# Patient Record
Sex: Male | Born: 1945 | Race: White | Hispanic: No | Marital: Married | Smoking: Former smoker
Health system: Southern US, Community
[De-identification: ages and names within clinical notes are randomized; demographics above are authoritative.]

## PROBLEM LIST (undated history)

## (undated) DIAGNOSIS — IMO0002 Reserved for concepts with insufficient information to code with codable children: Secondary | ICD-10-CM

## (undated) DIAGNOSIS — Z8507 Personal history of malignant neoplasm of pancreas: Secondary | ICD-10-CM

## (undated) DIAGNOSIS — Z8659 Personal history of other mental and behavioral disorders: Secondary | ICD-10-CM

## (undated) DIAGNOSIS — K449 Diaphragmatic hernia without obstruction or gangrene: Secondary | ICD-10-CM

## (undated) DIAGNOSIS — L309 Dermatitis, unspecified: Secondary | ICD-10-CM

## (undated) DIAGNOSIS — Z87442 Personal history of urinary calculi: Secondary | ICD-10-CM

## (undated) DIAGNOSIS — N400 Enlarged prostate without lower urinary tract symptoms: Secondary | ICD-10-CM

## (undated) DIAGNOSIS — R252 Cramp and spasm: Secondary | ICD-10-CM

## (undated) DIAGNOSIS — I739 Peripheral vascular disease, unspecified: Secondary | ICD-10-CM

## (undated) DIAGNOSIS — G4733 Obstructive sleep apnea (adult) (pediatric): Secondary | ICD-10-CM

## (undated) DIAGNOSIS — Z794 Long term (current) use of insulin: Secondary | ICD-10-CM

## (undated) DIAGNOSIS — Z972 Presence of dental prosthetic device (complete) (partial): Secondary | ICD-10-CM

## (undated) DIAGNOSIS — H8109 Meniere's disease, unspecified ear: Secondary | ICD-10-CM

## (undated) DIAGNOSIS — R0600 Dyspnea, unspecified: Secondary | ICD-10-CM

## (undated) DIAGNOSIS — J90 Pleural effusion, not elsewhere classified: Secondary | ICD-10-CM

## (undated) DIAGNOSIS — D6851 Activated protein C resistance: Secondary | ICD-10-CM

## (undated) DIAGNOSIS — M5136 Other intervertebral disc degeneration, lumbar region: Secondary | ICD-10-CM

## (undated) DIAGNOSIS — F32A Depression, unspecified: Secondary | ICD-10-CM

## (undated) DIAGNOSIS — E1165 Type 2 diabetes mellitus with hyperglycemia: Secondary | ICD-10-CM

## (undated) DIAGNOSIS — I10 Essential (primary) hypertension: Secondary | ICD-10-CM

## (undated) DIAGNOSIS — R399 Unspecified symptoms and signs involving the genitourinary system: Secondary | ICD-10-CM

## (undated) DIAGNOSIS — F329 Major depressive disorder, single episode, unspecified: Secondary | ICD-10-CM

## (undated) DIAGNOSIS — S31109A Unspecified open wound of abdominal wall, unspecified quadrant without penetration into peritoneal cavity, initial encounter: Secondary | ICD-10-CM

## (undated) DIAGNOSIS — H9191 Unspecified hearing loss, right ear: Secondary | ICD-10-CM

## (undated) DIAGNOSIS — M51369 Other intervertebral disc degeneration, lumbar region without mention of lumbar back pain or lower extremity pain: Secondary | ICD-10-CM

## (undated) DIAGNOSIS — Z87828 Personal history of other (healed) physical injury and trauma: Secondary | ICD-10-CM

## (undated) DIAGNOSIS — M1990 Unspecified osteoarthritis, unspecified site: Secondary | ICD-10-CM

## (undated) DIAGNOSIS — K219 Gastro-esophageal reflux disease without esophagitis: Secondary | ICD-10-CM

## (undated) DIAGNOSIS — F419 Anxiety disorder, unspecified: Secondary | ICD-10-CM

## (undated) DIAGNOSIS — I4510 Unspecified right bundle-branch block: Secondary | ICD-10-CM

## (undated) DIAGNOSIS — J634 Siderosis: Secondary | ICD-10-CM

## (undated) HISTORY — DX: Depression, unspecified: F32.A

## (undated) HISTORY — PX: INGUINAL HERNIA REPAIR: SUR1180

## (undated) HISTORY — DX: Major depressive disorder, single episode, unspecified: F32.9

## (undated) HISTORY — PX: HEMORRHOIDECTOMY WITH HEMORRHOID BANDING: SHX5633

## (undated) HISTORY — DX: Unspecified osteoarthritis, unspecified site: M19.90

## (undated) HISTORY — DX: Meniere's disease, unspecified ear: H81.09

## (undated) HISTORY — PX: KNEE ARTHROSCOPY: SUR90

## (undated) HISTORY — PX: TOTAL KNEE ARTHROPLASTY: SHX125

## (undated) HISTORY — PX: COLONOSCOPY: SHX174

## (undated) HISTORY — DX: Benign prostatic hyperplasia without lower urinary tract symptoms: N40.0

## (undated) HISTORY — PX: ROTATOR CUFF REPAIR: SHX139

## (undated) HISTORY — DX: Obstructive sleep apnea (adult) (pediatric): G47.33

## (undated) HISTORY — PX: UMBILICAL HERNIA REPAIR: SHX196

## (undated) MED FILL — Insulin Degludec Soln Pen-Injector 100 Unit/ML: SUBCUTANEOUS | Fill #2 | Status: CN

---

## 1997-07-31 ENCOUNTER — Encounter: Admission: RE | Admit: 1997-07-31 | Discharge: 1997-10-29 | Payer: Self-pay | Admitting: Family Medicine

## 1998-10-29 ENCOUNTER — Emergency Department (HOSPITAL_COMMUNITY): Admission: EM | Admit: 1998-10-29 | Discharge: 1998-10-29 | Payer: Self-pay | Admitting: Emergency Medicine

## 1998-10-29 ENCOUNTER — Encounter: Payer: Self-pay | Admitting: Emergency Medicine

## 1998-11-22 ENCOUNTER — Ambulatory Visit (HOSPITAL_COMMUNITY): Admission: RE | Admit: 1998-11-22 | Discharge: 1998-11-22 | Payer: Self-pay | Admitting: Interventional Cardiology

## 1999-08-11 ENCOUNTER — Ambulatory Visit (HOSPITAL_COMMUNITY): Admission: RE | Admit: 1999-08-11 | Discharge: 1999-08-11 | Payer: Self-pay | Admitting: Surgery

## 2000-01-02 ENCOUNTER — Encounter: Payer: Self-pay | Admitting: Orthopedic Surgery

## 2000-01-02 ENCOUNTER — Encounter: Admission: RE | Admit: 2000-01-02 | Discharge: 2000-01-02 | Payer: Self-pay | Admitting: Orthopedic Surgery

## 2000-04-10 ENCOUNTER — Encounter: Payer: Self-pay | Admitting: Orthopedic Surgery

## 2000-04-10 ENCOUNTER — Encounter: Admission: RE | Admit: 2000-04-10 | Discharge: 2000-04-10 | Payer: Self-pay | Admitting: Orthopedic Surgery

## 2000-08-07 ENCOUNTER — Encounter: Admission: RE | Admit: 2000-08-07 | Discharge: 2000-08-07 | Payer: Self-pay | Admitting: Orthopedic Surgery

## 2000-08-07 ENCOUNTER — Encounter: Payer: Self-pay | Admitting: Orthopedic Surgery

## 2000-08-08 ENCOUNTER — Ambulatory Visit (HOSPITAL_BASED_OUTPATIENT_CLINIC_OR_DEPARTMENT_OTHER): Admission: RE | Admit: 2000-08-08 | Discharge: 2000-08-08 | Payer: Self-pay | Admitting: Orthopedic Surgery

## 2000-10-30 ENCOUNTER — Encounter: Admission: RE | Admit: 2000-10-30 | Discharge: 2000-10-30 | Payer: Self-pay | Admitting: Orthopedic Surgery

## 2000-10-30 ENCOUNTER — Encounter: Payer: Self-pay | Admitting: Orthopedic Surgery

## 2000-11-23 ENCOUNTER — Ambulatory Visit (HOSPITAL_BASED_OUTPATIENT_CLINIC_OR_DEPARTMENT_OTHER): Admission: RE | Admit: 2000-11-23 | Discharge: 2000-11-23 | Payer: Self-pay | Admitting: General Surgery

## 2001-10-25 ENCOUNTER — Encounter: Admission: RE | Admit: 2001-10-25 | Discharge: 2001-10-25 | Payer: Self-pay | Admitting: Orthopedic Surgery

## 2001-10-25 ENCOUNTER — Encounter: Payer: Self-pay | Admitting: Orthopedic Surgery

## 2001-10-26 ENCOUNTER — Encounter: Admission: RE | Admit: 2001-10-26 | Discharge: 2001-10-26 | Payer: Self-pay | Admitting: Orthopedic Surgery

## 2001-10-26 ENCOUNTER — Encounter: Payer: Self-pay | Admitting: Orthopedic Surgery

## 2001-11-15 ENCOUNTER — Encounter: Payer: Self-pay | Admitting: Orthopedic Surgery

## 2001-11-15 ENCOUNTER — Encounter: Admission: RE | Admit: 2001-11-15 | Discharge: 2001-11-15 | Payer: Self-pay | Admitting: Orthopedic Surgery

## 2001-11-29 ENCOUNTER — Encounter: Admission: RE | Admit: 2001-11-29 | Discharge: 2001-11-29 | Payer: Self-pay | Admitting: Orthopedic Surgery

## 2001-11-29 ENCOUNTER — Encounter: Payer: Self-pay | Admitting: Orthopedic Surgery

## 2001-12-13 ENCOUNTER — Encounter: Payer: Self-pay | Admitting: Family Medicine

## 2001-12-13 ENCOUNTER — Encounter: Admission: RE | Admit: 2001-12-13 | Discharge: 2001-12-13 | Payer: Self-pay | Admitting: Family Medicine

## 2002-04-28 ENCOUNTER — Encounter
Admission: RE | Admit: 2002-04-28 | Discharge: 2002-07-27 | Payer: Self-pay | Admitting: Physical Medicine and Rehabilitation

## 2003-06-10 ENCOUNTER — Ambulatory Visit (HOSPITAL_COMMUNITY): Admission: RE | Admit: 2003-06-10 | Discharge: 2003-06-10 | Payer: Self-pay | Admitting: Family Medicine

## 2004-04-25 ENCOUNTER — Emergency Department (HOSPITAL_COMMUNITY): Admission: EM | Admit: 2004-04-25 | Discharge: 2004-04-25 | Payer: Self-pay | Admitting: Emergency Medicine

## 2004-11-25 ENCOUNTER — Emergency Department (HOSPITAL_COMMUNITY): Admission: EM | Admit: 2004-11-25 | Discharge: 2004-11-25 | Payer: Self-pay | Admitting: Emergency Medicine

## 2004-12-06 ENCOUNTER — Encounter: Admission: RE | Admit: 2004-12-06 | Discharge: 2004-12-06 | Payer: Self-pay | Admitting: Otolaryngology

## 2005-06-13 ENCOUNTER — Encounter: Admission: RE | Admit: 2005-06-13 | Discharge: 2005-06-27 | Payer: Self-pay | Admitting: Family Medicine

## 2006-03-08 ENCOUNTER — Encounter (INDEPENDENT_AMBULATORY_CARE_PROVIDER_SITE_OTHER): Payer: Self-pay | Admitting: Specialist

## 2006-03-08 ENCOUNTER — Ambulatory Visit (HOSPITAL_COMMUNITY): Admission: RE | Admit: 2006-03-08 | Discharge: 2006-03-08 | Payer: Self-pay | Admitting: Gastroenterology

## 2006-05-25 ENCOUNTER — Ambulatory Visit: Payer: Self-pay | Admitting: Critical Care Medicine

## 2006-06-22 ENCOUNTER — Ambulatory Visit: Payer: Self-pay | Admitting: Critical Care Medicine

## 2007-08-19 ENCOUNTER — Encounter: Admission: RE | Admit: 2007-08-19 | Discharge: 2007-08-19 | Payer: Self-pay | Admitting: Family Medicine

## 2007-08-20 ENCOUNTER — Ambulatory Visit (HOSPITAL_COMMUNITY): Admission: RE | Admit: 2007-08-20 | Discharge: 2007-08-20 | Payer: Self-pay | Admitting: Orthopedic Surgery

## 2008-01-31 ENCOUNTER — Inpatient Hospital Stay (HOSPITAL_COMMUNITY): Admission: RE | Admit: 2008-01-31 | Discharge: 2008-02-02 | Payer: Self-pay | Admitting: Orthopedic Surgery

## 2008-02-25 ENCOUNTER — Encounter: Admission: RE | Admit: 2008-02-25 | Discharge: 2008-03-20 | Payer: Self-pay | Admitting: Orthopedic Surgery

## 2008-07-09 ENCOUNTER — Encounter (INDEPENDENT_AMBULATORY_CARE_PROVIDER_SITE_OTHER): Payer: Self-pay | Admitting: Gastroenterology

## 2008-07-09 ENCOUNTER — Ambulatory Visit (HOSPITAL_COMMUNITY): Admission: RE | Admit: 2008-07-09 | Discharge: 2008-07-09 | Payer: Self-pay | Admitting: Gastroenterology

## 2008-09-29 ENCOUNTER — Ambulatory Visit (HOSPITAL_COMMUNITY): Admission: RE | Admit: 2008-09-29 | Discharge: 2008-09-29 | Payer: Self-pay | Admitting: Orthopedic Surgery

## 2008-10-08 ENCOUNTER — Ambulatory Visit (HOSPITAL_COMMUNITY): Admission: RE | Admit: 2008-10-08 | Discharge: 2008-10-08 | Payer: Self-pay | Admitting: Orthopedic Surgery

## 2008-11-25 ENCOUNTER — Encounter
Admission: RE | Admit: 2008-11-25 | Discharge: 2009-02-01 | Payer: Self-pay | Admitting: Physical Medicine and Rehabilitation

## 2009-02-02 ENCOUNTER — Encounter: Admission: RE | Admit: 2009-02-02 | Discharge: 2009-02-02 | Payer: Self-pay | Admitting: Family Medicine

## 2009-08-20 ENCOUNTER — Ambulatory Visit (HOSPITAL_BASED_OUTPATIENT_CLINIC_OR_DEPARTMENT_OTHER): Admission: RE | Admit: 2009-08-20 | Discharge: 2009-08-20 | Payer: Self-pay | Admitting: Orthopedic Surgery

## 2009-08-20 HISTORY — PX: OTHER SURGICAL HISTORY: SHX169

## 2009-10-08 ENCOUNTER — Ambulatory Visit: Payer: Self-pay | Admitting: Family Medicine

## 2009-10-08 DIAGNOSIS — R079 Chest pain, unspecified: Secondary | ICD-10-CM | POA: Insufficient documentation

## 2009-10-08 DIAGNOSIS — M940 Chondrocostal junction syndrome [Tietze]: Secondary | ICD-10-CM | POA: Insufficient documentation

## 2010-03-13 ENCOUNTER — Encounter: Payer: Self-pay | Admitting: Family Medicine
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
## 2010-05-08 LAB — GLUCOSE, CAPILLARY: Glucose-Capillary: 188 mg/dL — ABNORMAL HIGH (ref 70–99)

## 2010-05-08 LAB — POCT HEMOGLOBIN-HEMACUE: Hemoglobin: 15.4 g/dL (ref 13.0–17.0)

## 2010-07-05 NOTE — Op Note (Signed)
NAMEWILBURT, MESSINA              ACCOUNT NO.:  [id number]   MEDICAL RECORD NO.:  [id number]          PATIENT TYPE:  AMB   LOCATION:  ENDO                         FACILITY:  [facility]   PHYSICIAN:  Shirley Friar, MDDATE OF BIRTH:  [birth year]   DATE OF PROCEDURE:  DATE OF DISCHARGE:  07/09/2008                               OPERATIVE REPORT   INDICATION:  Esophageal reflux, chronic cough.   MEDICATIONS:  Fentanyl 60 mcg IV, Versed 6 mg IV, and Cetacaine spray  x2.   FINDINGS:  Endoscope was inserted through the oropharynx and esophagus  was intubated which was normal in its entirety.  Endoscope was then  advanced into the stomach which revealed linear erythematous streaks  with white exudate and scattered nodularity in the antrum and distal  stomach.  Retroflexion was done which revealed a medium-sized hiatal  hernia.  Endoscope was straightened and advanced into the duodenal bulb  and second portion of the duodenum which were both normal.  Endoscope  was withdrawn back into the stomach and two biopsies were taken in the  distal stomach to check his histology.  The endoscope was then withdrawn  to confirm the above findings.   ASSESSMENT:  1. Distal antral erythema and nodularity consistent with gastritis      status post biopsies.  2. Hiatal hernia.   PLAN:  1. Follow up on path.  2. Avoid NSAIDs.  3. Continue proton pump inhibitor therapy daily.      Shirley Friar, MD  Electronically Signed     VCS/MEDQ  D:  07/09/2008  T:  07/10/2008  Job:  [id number]   cc:   Miguel Aschoff, M.D.

## 2010-07-05 NOTE — Op Note (Signed)
[name], [name] NO.:  [id number]   MEDICAL RECORD NO.:  [id number]          PATIENT TYPE:  INP   LOCATION:  5036                         FACILITY:  [facility]   PHYSICIAN:  Ollen Gross, M.D.    DATE OF BIRTH:  Jul 03, 1945   DATE OF PROCEDURE:  01/31/2008  DATE OF DISCHARGE:                               OPERATIVE REPORT   PREOPERATIVE DIAGNOSIS:  Osteoarthritis, left knee.   POSTOPERATIVE DIAGNOSIS:  Osteoarthritis, left knee.   PROCEDURE:  Left total knee arthroplasty.   SURGEON:  Ollen Gross, MD   ASSISTANT:  Alexzandrew L. Julien Girt, PA-C   ANESTHESIA:  Spinal with Duramorph.   ESTIMATED BLOOD LOSS:  Minimal.   DRAIN:  None.   TOURNIQUET TIME:  39 minutes at 300 mmHg.   COMPLICATIONS:  None.   CONDITION:  Stable to recovery.   BRIEF CLINICAL NOTE:  Derek [name] is a 65 year old male who has end-  stage arthritis of both the knees, the left more symptomatic than the  right.  He has a failed nonoperative management and presents now for  total knee arthroplasty.   PROCEDURE IN DETAIL:  After successful administration of spinal  anesthetic, a tourniquet was placed high on his left thigh and his left  lower extremity was prepped and draped in the usual sterile fashion.  The extremities wrapped in Esmarch, knee flexed, and tourniquet inflated  to 300 mmHg.  Midline incision was made with a #10 blade through the  subcutaneous tissue to the level of the extensor mechanism.  A fresh  blade was used to make a medial parapatellar arthrotomy.  Soft tissue  over the proximal medial tibia subperiosteally elevated to the joint  line with a knife and into the semimembranosus bursa with a Cobb  elevator.  Soft tissue laterally was elevated with attention being paid  to avoiding the patellar tendon on the tibial tubercle.  The patella  subluxed laterally, knee flexed 90 degrees, and ACL and PCL removed.  Drill was used to create a starting hole in the distal  femur and the  canal was thoroughly irrigated.  The 5-degree left valgus alignment  guide was placed and referencing off the posterior condyles rotations  marked and a block pinned to remove 11 mm of the distal femur.  I took  an 11 because of the preop flexion contracture.  Distal femoral  resection was made with an oscillating saw.  Sizing block was placed,  and the size 4 was most appropriate.  Rotations marked off the  epicondylar axis, and the size 4 cutting block placed.  The anterior,  posterior, and chamfer cuts were then made.   Tibia subluxed forward and menisci removed.  Extramedullary tibial  alignment guide was placed referencing proximally at the medial aspect  of the tibial tubercle and distally along the second metatarsal axis and  tibial crest.  Block was pinned to remove 10 mm of the nondeficient  lateral side.  The bone was removed and then size 4 was the most  appropriate tibial component.  The proximal tibia was prepared to  modular drill and keel  punch for the size 4.  Femoral preparation was  completed with the intercondylar block.   Size 4 mobile bearing tibial trial, size 4 posterior stabilized femoral  trial, and a 10-mm posterior stabilized rotating platform insert trial  were placed.  A 10 is a tiny bit hyperextension, so we went to a 12.5  which allowed for full extension with excellent varus, valgus, anterior  and posterior balance throughout full range of motion.  The patella was  then everted and thickness measured to be 23 mm.  Freehand resection was  taken to 13 mm, 38 template was placed, lug holes were drilled, trial  patella was placed, and it tracks normally.  Osteophytes removed off the  posterior femur with the trial in place.  All trials were removed, and  the cut bone surfaces were prepared with pulsatile lavage.  Cement was  mixed, and once ready for implantation, a size 4 mobile bearing tibial  tray size 4 posterior stabilized femur and 38  patella were cemented into  place.  Patella was held with a clamp.  Trial 12.5-mm inserts placed,  knee held in full extension, and all extruded cement removed.  When the  cement fully hardened, then the permanent 12.5-mm posterior stabilized  rotating platform insert was placed into the tibial tray.  Wounds  copiously irrigated with saline solution and the FloSeal injected on the  posterior capsule, medial and lateral gutters, and suprapatellar area.  Moist sponge was placed, and tourniquet released with a total time of 39  minutes.  Minimal bleeding was encountered.  The bleeding that was  encountered stopped with electrocautery.  The wound was again irrigated  and then the arthrotomy closed with interrupted #1 PDS.  Flexion against  gravity is about 135 degrees.  Subcu was closed with interrupted 2-0  Vicryl and subcuticular running 4-0 Monocryl.  The incisions cleaned and  dried and Steri-Strips and bulky sterile dressing applied.  He was then  placed into a knee immobilizer, awakened, and transported to recovery in  stable condition.      Ollen Gross, M.D.  Electronically Signed     FA/MEDQ  D:  01/31/2008  T:  02/01/2008  Job:  [id number]

## 2010-07-05 NOTE — H&P (Signed)
NAME:  Derek [name], Derek [name] NO.:  [id number]   MEDICAL RECORD NO.:  [id number]           PATIENT TYPE:   LOCATION:                                 FACILITY:   PHYSICIAN:  Gaynelle Arabian, M.D.         DATE OF BIRTH:   DATE OF ADMISSION:  DATE OF DISCHARGE:                              HISTORY & PHYSICAL   CHIEF COMPLAINT:  Left knee pain.   HISTORY OF PRESENT ILLNESS:  The patient is a 65 year old male seen by  Dr. Wynelle Link for ongoing bilateral knee pain.  The left is more  problematic and symptomatic than the right knee.  He has had  arthroscopies done by Dr. French Ana in the past and he said that his knees  were scraped out.  He was told he had moderate amount of arthritis at  that time.  Unfortunately, the knees have progressively gotten worse.  He was seen in the office where he was found have bone-on-bone medial  compartments in both knees and also patellofemoral compartment arthritis  bilaterally.  The left looks a little worse than the right, more  symptomatic, felt to be a good candidate.  He has been seen  preoperatively by Dr. Harrington Challenger and felt to be stable for surgery.  Dr. Harrington Challenger  recommend to contact [facility] hospitalist if there was any problems that  occur during the hospital stay.   ALLERGIES:  QUININE.   CURRENT MEDICATIONS:  Ketoprofen, Flomax, omeprazole, Prandin, Flex-a-  min, and Xanax.   PAST MEDICAL HISTORY:  1. Tinnitus.  2. Meniere disease.  3. Bronchitis.  4. Reflux disease.  5. History of peptic ulcer disease.  6. Enlarged prostate.  7. Diabetes mellitus.  8. Kidney stones.  9. Arthritis.  10.Childhood illnesses of measles and mumps.  11.Also a left rotator cuff tear in the left arm.   PREVIOUS SURGERIES:  1. Right inguinal hernia repair in 1974.  2. Right inguinal hernia in 1982.  3. Left rotator cuff, May 1993.  4. Right rotator cuff in 1993.  5. Left rotator cuff repair again in [id number].  6. Umbilical hernia repair around 2000.  7.  Right knee arthroscopy around 2002.  8. Left knee arthritis around 2002.  9. He has also undergone a cardiac catheterization.   FAMILY HISTORY:  Father deceased age 12 with cancer.  Mother deceased at  age 32 natural causes.   SOCIAL HISTORY:  Married, disabled, past smoker 3-4 packs a day for 14  years, no alcohol intake, 2 children.  Spouse will be helping him with  [place] following surgery.   REVIEW OF SYSTEMS:  GENERAL:  No fevers, chills, or night sweats.  NEUROLOGIC:  He does have ringing in his ears and hearing loss also  Meniere disease.  RESPIRATORY:  A little bit of shortness of breath on  exertion, but no shortness of breath at rest.  No productive cough or  hemoptysis.  CARDIOVASCULAR:  No chest pain.  GI:  No nausea, vomiting,  diarrhea, or constipation.  GU:  A little bit of weak stream.  He does  have an  enlarged prostate.  No dysuria or hematuria.  MUSCULOSKELETAL:  Joint pain with the knees.   PHYSICAL EXAMINATION:  VITAL SIGNS:  Pulse 76, respirations 14, blood  pressure 132/84.  GENERAL:  A 65 year old white male well-nourished, well-developed, large  frame, alert, oriented, and cooperative, pleasant.  HEENT:  Normocephalic and atraumatic.  Pupils are equal, round, and  reactive.  Oropharynx clear.  EOMs intact.  Does have a lower partial  plate.  NECK:  Supple.  CHEST:  Clear.  HEART:  Regular rate and rhythm.  No murmur.  S1 and S2 noted.  ABDOMEN:  Soft, nontender.  Bowel sounds present.  RECTAL:  Not done.  BREASTS:  Not done.  GENITALIA:  Not done.  PELVIS:  Stable.  EXTREMITIES:  Left knee range of motion 10-120, marked crepitus, tender  more medial than lateral.   IMPRESSION:  Osteoarthritis of the left knee.   PLAN:  The patient admitted to [facility], undergo a left  total knee replacement arthroplasty.  Surgery will be performed by Dr.  Gaynelle Arabian.  Dr. Harrington Challenger has recommended to contact [facility] hospitalist if  there is any problems that  occur during the hospital stay.  The patient  has requested advanced home care postoperatively to assist with his home  therapy.      Alexzandrew L. Perkins, P.A.C.      Gaynelle Arabian, M.D.  Electronically Signed    ALP/MEDQ  D:  01/30/2008  T:  01/31/2008  Job:  [id number]   cc:   C. Melinda Crutch, M.D.  Gaynelle Arabian, M.D.

## 2010-07-08 NOTE — Assessment & Plan Note (Signed)
[place]                             PULMONARY OFFICE NOTE   NAME:Derek [name], Derek [name]                     MRN:          [id number]  DATE:05/25/2006                            DOB:          1945/11/10    CHIEF COMPLAINT:  Evaluate cough.   HISTORY OF PRESENT ILLNESS:  A 65 year old obese male who is referred  for chronic cough which he has had for years.  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  The cough is mainly dry but  occasionally productive of white mucus.  He has no hemoptysis.  The  cough comes and goes, denies chest pain.  He is short of breath with  some exertion, has occasional wheezing.  Denies any nocturnal symptoms.  He has aching that comes and goes across the chest.  He has heartburn  and indigestion despite being on Prilosec 20 mg twice daily.  He denies  any dysphagia or sore throat, has occasional headaches, occasional hand  and foot swelling.  He smoked 4 packs a day for 30 years but quit  smoking in 1977.  He is referred for further evaluation.   PAST HISTORY:  MEDICAL:  No history of hypertension, heart disease,  asthma, emphysema, diabetes, increased cholesterol, stroke, blood clots,  or cancer.  OPERATIVE:  Bilateral rotator cuff repairs, hernia repairs, and knee  surgeries.   SOCIAL HISTORY:  The patient smoked for 30 years, 4 packs daily, quit  1977.  He is disabled, lives with his spouse.   FAMILY HISTORY:  Heart disease in paternal grandmother, grandfather,  paternal uncle.   REVIEW OF SYSTEMS:  Otherwise noncontributory.   CURRENT MEDICATIONS:  1. Ketoprofen 75 mg, 2 in the morning, 1 in the evening.  2. Omeprazole 20 mg b.i.d.  3. Flomax 0.4 mg daily.  4. Glucosamine b.i.d.   PHYSICAL EXAMINATION:  Temperature 97.8, blood pressure 130/88, pulse  67, saturation was 96% on room air.  CHEST:  Distant breath sounds with prolonged expiratory phase, no wheeze  or rhonchi were noted.  CARDIAC:  Regular rate and rhythm without S3; normal S1, S2.  ABDOMEN:  Obese, bowel sounds were active.  EXTREMITIES:  No edema, clubbing, or venous disease.  SKIN:  Clear.  NEUROLOGIC:  Intact.  HEENT:  Unremarkable.   LABORATORY DATA:  Spirometry was obtained, showed to be completely  normal with normal spirometry.  Chest x-ray is pending at the time of  this dictation.   IMPRESSION:  Cyclic cough, likely exacerbated by reflux disease.  No  primary lung disease.   PLAN:  Obtain chest x-ray, full set of pulmonary function studies and  begin Zegerid 40 mg twice a day for a week then 40 mg daily thereafter.  Discontinue omeprazole.  Begin the cyclic cough protocol with Tussionex  and Tessalon Perles, and we will see the patient back in return followup  in one month.     Burnett Harry Joya Gaskins, MD, [facility]  Electronically Signed    PEW/MedQ  DD: 05/25/2006  DT: 05/26/2006  Job #: [id number]   cc:  Myriam Jacobson, M.D.

## 2010-07-08 NOTE — Discharge Summary (Signed)
NAMEKAILIN, VUU NO.:  [id number]   MEDICAL RECORD NO.:  [id number]          PATIENT TYPE:  INP   LOCATION:  5036                         FACILITY:  [facility]   PHYSICIAN:  Gaynelle Arabian, M.D.    DATE OF BIRTH:  15-Nov-1945   DATE OF ADMISSION:  01/31/2008  DATE OF DISCHARGE:  02/02/2008                               DISCHARGE SUMMARY   DISCHARGE DIAGNOSIS:  Osteoarthritis, left knee.   OTHER DIAGNOSES:  1. Tinnitus.  2. Meniere disease.  3. Bronchitis.  4. Reflux.  5. History of peptic ulcer disease.  6. Prostate hypertrophy.  7. Diabetes mellitus.  8. Kidney stones.   PROCEDURE:  Left total knee arthroplasty by Dr. Gaynelle Arabian on  January 31, 2008.   HOSPITAL COURSE:  Mr. Inouye was admitted to the [facility] on  January 31, 2008, via the operating room.  At that time, he underwent a  left total knee arthroplasty by Dr. Wynelle Link assisted by Mickel Crow, PA-C.  The procedure was tolerated well without any problems.  He went to the orthopedic floor postoperatively in stable condition.  On  postoperative day #1, he was doing well.  Pain was well controlled with  his PCA.  Hemoglobin on postop day #1 was 13.4 and his BMET showed  normal electrolytes.  Glucose was somewhat elevated at 199.  He was  covered with sliding scale, which helped to keep his glucose under good  control.  As of postoperative day #2, he was in stable condition,  tolerating his carb-modified medium diet.  He was ambulating in the  hallway and felt like he was ready to go home.  He had walked 60 feet on  the morning of discharge without difficulty.  His dressing was changed  that morning and his incision was well healed with no erythema or  exudate.  Pain was under good control.  His INR was 1.2 at the time of  discharge.  He was discharged home in stable condition.   DISCHARGE MEDICATIONS:  Include his admission medications of Flomax,  Prilosec, Xanax as well as  postoperative medications of Percocet 1-2  tablets every 4-6 hours as needed for pain, Coumadin 5 mg at 6:00 p.m.  daily, Robaxin 500 mg 1 tablet every 6-8 hours as needed for muscle  spasm.   The patient is able to shower one day after discharge.  He will have  home health physical therapy and home health RN to draw his INR for his  Coumadin monitoring.  His hemoglobin at the time of discharge is 12.6  with electrolytes showing mild hyponatremia of 132, potassium 3.9, BUN  11, creatinine 0.97.   PLAN:  Follow up with Dr. Wynelle Link 2 weeks postdischarge for routine  monitoring of his postoperative course.      Gaynelle Arabian, M.D.  Electronically Signed     FA/MEDQ  D:  03/31/2008  T:  04/01/2008  Job:  [id number]

## 2010-07-08 NOTE — Op Note (Signed)
Derek [name], Derek [name]              ACCOUNT NO.:  [id number]   MEDICAL RECORD NO.:  [id number]          PATIENT TYPE:  AMB   LOCATION:  ENDO                         FACILITY:  [facility]   PHYSICIAN:  Shirley Friar, MDDATE OF BIRTH:  May 04, 1945   DATE OF PROCEDURE:  03/08/2006  DATE OF DISCHARGE:                               OPERATIVE REPORT   PROCEDURE:  Upper endoscopy.   INDICATIONS:  Heartburn, chronic cough.   MEDICATIONS:  Fentanyl 50 mcg IV, Versed 5 mg IV.   FINDINGS:  The endoscope was inserted into the oropharynx and the  esophagus was intubated which was normal in its entirety.  The endoscope  was advanced down to the stomach which revealed scattered punctate  ulcerations with surrounding edema in the antrum and prepyloric area.  Retroflexion was done which revealed a medium sized hiatal hernia but,  otherwise, normal proximal stomach.  The endoscope was straightened and  advanced down into the duodenal bulb, which was normal in appearance,  and then advanced to the second portion of the duodenum which was also  normal.  The endoscope was withdrawn back into the stomach and two  biopsies were taken of the antrum.  The endoscope was then withdrawn to  confirm the above findings.   ASSISTANT:  1. Scattered ulcerations in antrum with surrounding erythema      concerning for gastritis status post biopsy.  2. Hiatal hernia.   PLAN:  1. Follow-up on pathology.  2. Continue proton pump inhibitor daily.  3. Avoid NSAIDS.  4. Lifestyle modifications for reflux.      Shirley Friar, MD  Electronically Signed     VCS/MEDQ  D:  03/08/2006  T:  03/08/2006  Job:  [id number]

## 2010-07-08 NOTE — Op Note (Signed)
[place]  Patient:    Derek [name], Derek [name]                  MRN: [id number] Proc. Date: 08/08/00 Adm. Date:  [id number] Attending:  Teena Dunk                           Operative Report  PREOPERATIVE DIAGNOSIS: 1. Right knee torn medial meniscus. 2. Chondromalacia patella grade 3, generalized. 3. Chondromalacia medial compartment grade 3, early. 4. Small lateral meniscal tear. POSTOPERATIVE DIAGNOSIS: 1. Right knee torn medial meniscus. 2. Chondromalacia patella grade 3, generalized. 3. Chondromalacia medial compartment grade 3, early. 4. Small lateral meniscal tear.  OPERATION PERFORMED: 1. Partial medial meniscectomy 20 to 30%. 2. Debridement chondroplasty patellofemoral joint. 3. Debridement chondroplasty medial compartment. 4. Partial excision of lateral meniscus 10 to 15%.  SURGEON:  Sharlot Gowda., M.D.  ANESTHESIA:  MAC.  INDICATIONS FOR PROCEDURE:  Right knee torn medial meniscus on MRI thought to be amenable to outpatient arthroscopy.  DESCRIPTION OF PROCEDURE:  Arthroscoped through inferomedial and inferolateral portals.  Systematic inspection of the knee showed grade 3 chondromalacia of the patellofemoral joint, particularly on the patella which was debrided.  The trochlea appeared relatively spared.  Medial compartment early grade 3 changes on femoral condyle.  Rather generalized but very early debrided.  Posterior horn tear of the medial meniscus back resection 30% of the  meniscus substance back to actually a good stable rim although there was still meniscus remaining that was not torn.  The slight softening of the lateral femoral condyle and small lateral meniscal tear debrided with 10 to 15% meniscus substance.  Knee drained of clear fluid.  Portals and joints Marcaine and morphine with an additional 40 mg Depo-Medrol. Portals closed with nylon.  Light compressive sterile dressing applied.  The  patient was taken to the recovery room in stable condition. DD:  08/08/00 TD:  08/08/00 Job: [id number]

## 2010-07-08 NOTE — Procedures (Signed)
[facility]  Patient:    Derek [name], Derek [name]                     MRN: [id number] Proc. Date: 08/11/99 Adm. Date:  [id number] Attending:  Abigail Miyamoto A                           Procedure Report  PREOPERATIVE DIAGNOSIS:  Umbilical hernia.  POSTOPERATIVE DIAGNOSIS:  Umbilical hernia.  PROCEDURE:  Umbilical hernia repair.  SURGEON:  Abigail Miyamoto, M.D.  ANESTHESIA:  General endotracheal anesthesia and 0.25% Marcaine plain.  ESTIMATED BLOOD LOSS:  Minimal.  DESCRIPTION OF PROCEDURE:  The patient was brought to the operating room, identified as [name]. He was placed supine on the operating table and general anesthesia was induced. His abdomen was then prepped and draped in the usual sterile fashion. Using a #10 blade, a small transverse incision was made just below the umbilicus. The incision was carried down to the hernia sac with the electrocautery. The large hernia sac was then identified and dissected circumferentially. The sac was found to contain only some omentum. The sac was then opened and completed transected at its base. One small vessel was tied off with a silk tie. Once the sac was removed, the fascial defect which was approximately 1.5 cm in size was closed with interrupted #1 Prolene sutures. The wound was then irrigated. The umbilicus was then tacked back in place with a single 2-0 Vicryl suture. The wound was anesthetized with 0.25% Marcaine. The subcutaneous layer was then closed with interrupted 3-0 Vicryl suture and the skin was closed with running 4-0 monocryl. Steri-Strips, gauze and Tegaderm were then applied. The patient tolerated the procedure well. All sponge, needle and instrument count were correct at the end of the procedure. The patient was then extubated in the operating room and taken in stable condition to the recovery room. DD:  08/11/99 TD:  08/12/99 Job: [id number]

## 2010-07-08 NOTE — Op Note (Signed)
[place]  Patient:    Derek [name], Derek [name] Visit Number: [id number] MRN: [id number]          Service Type: DSU Location: [facility] Attending Physician:  Teena Dunk Dictated by:   Sharlot Gowda., M.D. Proc. Date: 11/23/00 Admit Date:  11/23/2000                             Operative Report  PREOPERATIVE DIAGNOSES: 1. Medial meniscus tear, left knee. 2. Lateral meniscus tear. 3. Osteoarthritis, medial compartment and patellofemoral joint.  POSTOPERATIVE DIAGNOSES: 1. Medial meniscus tear, left knee. 2. Lateral meniscus tear. 3. Osteoarthritis, medial compartment and patellofemoral joint.  PROCEDURES: 1. Partial medial and lateral meniscectomies. 2. Debridement chondroplasty, patellofemoral joint. 3. Debridement chondroplasty, medial compartment.  SURGEON:  Sharlot Gowda., M.D.  ANESTHESIA:  _____.  INDICATION:  A 65 year old male with consistent medial joint line pain and tenderness not responding to conservative treatment.  DESCRIPTION OF PROCEDURE:  Arthroscope, superomedial, inferomedial, and inferolateral portals.  Systematic inspection of the knee showed the patient to have some significant chondromalacia of the patellofemoral joint, grade 3 and even some grade 4 changes, mainly the patella, less severe on the femoral side.  These were debrided.  The medial compartment showed more of a punctate area of about a 2 x 2 cm area on the medial femoral condyle, early grade 3 changes, which were debrided, some more generalized early grade 3 and mainly grade 2 changes on the tibia.  There was a small complex tear of the posterior horn of the meniscus requiring resection of about 20-25% of the meniscus substance; however, a good portion of the posterior horn could be left, and it was not torn.  The ACL showed some mild laxity.  The lateral meniscus showed a very small tear requiring about 10% resection of the  lateral meniscus posterior horn, and there were no significant degenerative changes in the lateral compartment.  Debridement chondroplasty was carried out of the patellofemoral joint separate from the medial compartment.  Medial meniscectomy and partial lateral meniscectomy with basket forceps, motorized shaving instruments.  Knee drained free of fluids, portals closed with nylon, lightly compressive sterile dressing applied, and the knee infiltrated with Marcaine and morphine as well. Dictated by:   Sharlot Gowda., M.D. Attending Physician:  Teena Dunk DD:  11/23/00 TD:  11/24/00 Job: [id number]

## 2010-07-15 ENCOUNTER — Other Ambulatory Visit (HOSPITAL_COMMUNITY): Payer: Self-pay | Admitting: Orthopedic Surgery

## 2010-07-15 ENCOUNTER — Ambulatory Visit (HOSPITAL_COMMUNITY)
Admission: RE | Admit: 2010-07-15 | Discharge: 2010-07-15 | Disposition: A | Discharge status: Home / Self Care | Payer: Medicare Other | Source: Ambulatory Visit | Attending: Orthopedic Surgery | Admitting: Orthopedic Surgery

## 2010-07-15 DIAGNOSIS — S0540XA Penetrating wound of orbit with or without foreign body, unspecified eye, initial encounter: Secondary | ICD-10-CM

## 2010-07-15 DIAGNOSIS — Z01818 Encounter for other preprocedural examination: Secondary | ICD-10-CM | POA: Insufficient documentation

## 2010-11-24 LAB — URINALYSIS, ROUTINE W REFLEX MICROSCOPIC
Bilirubin Urine: NEGATIVE
Glucose, UA: NEGATIVE mg/dL
Hgb urine dipstick: NEGATIVE
Ketones, ur: NEGATIVE mg/dL
Nitrite: NEGATIVE
Protein, ur: NEGATIVE mg/dL
Specific Gravity, Urine: 1.019 (ref 1.005–1.030)
Urobilinogen, UA: 1 mg/dL (ref 0.0–1.0)
pH: 6 (ref 5.0–8.0)

## 2010-11-24 LAB — BASIC METABOLIC PANEL
BUN: 11 mg/dL (ref 6–23)
BUN: 12 mg/dL (ref 6–23)
CO2: 26 mEq/L (ref 19–32)
CO2: 31 mEq/L (ref 19–32)
Calcium: 9.6 mg/dL (ref 8.4–10.5)
Calcium: 9.7 mg/dL (ref 8.4–10.5)
Chloride: 100 mEq/L (ref 96–112)
Chloride: 95 mEq/L — ABNORMAL LOW (ref 96–112)
Creatinine, Ser: 0.92 mg/dL (ref 0.4–1.5)
Creatinine, Ser: 0.97 mg/dL (ref 0.4–1.5)
GFR calc Af Amer: >60 mL/min (ref >60)
GFR calc Af Amer: >60 mL/min (ref >60)
GFR calc non Af Amer: >60 mL/min (ref >60)
GFR calc non Af Amer: >60 mL/min (ref >60)
Glucose, Bld: 143 mg/dL — ABNORMAL HIGH (ref 70–99)
Glucose, Bld: 199 mg/dL — ABNORMAL HIGH (ref 70–99)
Potassium: 3.9 mEq/L (ref 3.5–5.1)
Potassium: 4.8 mEq/L (ref 3.5–5.1)
Sodium: 132 mEq/L — ABNORMAL LOW (ref 135–145)
Sodium: 135 mEq/L (ref 135–145)

## 2010-11-24 LAB — CBC
HCT: 35.9 % — ABNORMAL LOW (ref 39.0–52.0)
HCT: 39 % (ref 39.0–52.0)
HCT: 45 % (ref 39.0–52.0)
Hemoglobin: 12.6 g/dL — ABNORMAL LOW (ref 13.0–17.0)
Hemoglobin: 13.4 g/dL (ref 13.0–17.0)
Hemoglobin: 15.4 g/dL (ref 13.0–17.0)
MCHC: 34.2 g/dL (ref 30.0–36.0)
MCHC: 34.5 g/dL (ref 30.0–36.0)
MCHC: 35.2 g/dL (ref 30.0–36.0)
MCV: 85.2 fL (ref 78.0–100.0)
MCV: 85.2 fL (ref 78.0–100.0)
MCV: 85.9 fL (ref 78.0–100.0)
Platelets: 144 10*3/uL — ABNORMAL LOW (ref 150–400)
Platelets: 178 10*3/uL (ref 150–400)
Platelets: 179 10*3/uL (ref 150–400)
RBC: 4.18 MIL/uL — ABNORMAL LOW (ref 4.22–5.81)
RBC: 4.57 MIL/uL (ref 4.22–5.81)
RBC: 5.28 MIL/uL (ref 4.22–5.81)
RDW: 12.5 % (ref 11.5–15.5)
RDW: 12.6 % (ref 11.5–15.5)
RDW: 12.8 % (ref 11.5–15.5)
WBC: 4 10*3/uL (ref 4.0–10.5)
WBC: 5.3 10*3/uL (ref 4.0–10.5)
WBC: 5.6 10*3/uL (ref 4.0–10.5)

## 2010-11-24 LAB — GLUCOSE, CAPILLARY
Glucose-Capillary: 112 mg/dL — ABNORMAL HIGH (ref 70–99)
Glucose-Capillary: 131 mg/dL — ABNORMAL HIGH (ref 70–99)
Glucose-Capillary: 131 mg/dL — ABNORMAL HIGH (ref 70–99)
Glucose-Capillary: 135 mg/dL — ABNORMAL HIGH (ref 70–99)
Glucose-Capillary: 141 mg/dL — ABNORMAL HIGH (ref 70–99)
Glucose-Capillary: 149 mg/dL — ABNORMAL HIGH (ref 70–99)
Glucose-Capillary: 172 mg/dL — ABNORMAL HIGH (ref 70–99)
Glucose-Capillary: 191 mg/dL — ABNORMAL HIGH (ref 70–99)
Glucose-Capillary: 212 mg/dL — ABNORMAL HIGH (ref 70–99)

## 2010-11-24 LAB — COMPREHENSIVE METABOLIC PANEL
ALT: 27 U/L (ref 0–53)
AST: 23 U/L (ref 0–37)
Albumin: 4.1 g/dL (ref 3.5–5.2)
Alkaline Phosphatase: 91 U/L (ref 39–117)
BUN: 15 mg/dL (ref 6–23)
CO2: 22 mEq/L (ref 19–32)
Calcium: 11.2 mg/dL — ABNORMAL HIGH (ref 8.4–10.5)
Chloride: 106 mEq/L (ref 96–112)
Creatinine, Ser: 0.92 mg/dL (ref 0.4–1.5)
GFR calc Af Amer: >60 mL/min (ref >60)
GFR calc non Af Amer: >60 mL/min (ref >60)
Glucose, Bld: 131 mg/dL — ABNORMAL HIGH (ref 70–99)
Potassium: 4.5 mEq/L (ref 3.5–5.1)
Sodium: 134 mEq/L — ABNORMAL LOW (ref 135–145)
Total Bilirubin: 0.7 mg/dL (ref 0.3–1.2)
Total Protein: 6.6 g/dL (ref 6.0–8.3)

## 2010-11-24 LAB — PROTIME-INR
INR: 1 (ref 0.00–1.49)
INR: 1.2 (ref 0.00–1.49)
INR: 1.2 (ref 0.00–1.49)
Prothrombin Time: 13.7 seconds (ref 11.6–15.2)
Prothrombin Time: 15.7 seconds — ABNORMAL HIGH (ref 11.6–15.2)
Prothrombin Time: 15.9 seconds — ABNORMAL HIGH (ref 11.6–15.2)

## 2010-11-24 LAB — TYPE AND SCREEN
ABO/RH(D): O POS
Antibody Screen: NEGATIVE

## 2010-11-24 LAB — APTT: aPTT: 29 seconds (ref 24–37)

## 2010-11-24 LAB — ABO/RH: ABO/RH(D): O POS

## 2011-02-21 HISTORY — PX: HARDWARE REMOVAL: SHX979

## 2011-06-13 ENCOUNTER — Other Ambulatory Visit: Payer: Self-pay | Admitting: Family Medicine

## 2011-06-19 ENCOUNTER — Ambulatory Visit
Admission: RE | Admit: 2011-06-19 | Discharge: 2011-06-19 | Disposition: A | Discharge status: Home / Self Care | Payer: Medicare Other | Source: Ambulatory Visit | Attending: Family Medicine | Admitting: Family Medicine

## 2011-06-28 ENCOUNTER — Other Ambulatory Visit (HOSPITAL_COMMUNITY): Payer: Self-pay | Admitting: Family Medicine

## 2011-07-03 ENCOUNTER — Encounter (HOSPITAL_COMMUNITY)
Admission: RE | Admit: 2011-07-03 | Discharge: 2011-07-03 | Disposition: A | Discharge status: Home / Self Care | Payer: Medicare Other | Source: Ambulatory Visit | Attending: Family Medicine | Admitting: Family Medicine

## 2011-07-03 DIAGNOSIS — R109 Unspecified abdominal pain: Secondary | ICD-10-CM | POA: Insufficient documentation

## 2011-07-03 MED ORDER — TECHNETIUM TC 99M MEBROFENIN IV KIT
5.3000 | PACK | Freq: Once | INTRAVENOUS | Status: AC | PRN
  Administered 2011-07-03: 5 via INTRAVENOUS

## 2011-07-03 MED ORDER — SINCALIDE 5 MCG IJ SOLR: 0.0200 ug/kg | Freq: Once | INTRAMUSCULAR | Status: DC

## 2011-07-04 ENCOUNTER — Emergency Department: Admit: 2011-07-04 | Discharge: 2011-07-04 | Disposition: A | Discharge status: Home / Self Care | Payer: Medicare Other

## 2011-07-04 ENCOUNTER — Emergency Department (INDEPENDENT_AMBULATORY_CARE_PROVIDER_SITE_OTHER)
Admission: EM | Admit: 2011-07-04 | Discharge: 2011-07-04 | Disposition: A | Discharge status: Home / Self Care | Payer: Medicare Other | Source: Home / Self Care | Attending: Emergency Medicine | Admitting: Emergency Medicine

## 2011-07-04 ENCOUNTER — Encounter: Payer: Self-pay | Admitting: *Deleted

## 2011-07-04 DIAGNOSIS — R059 Cough, unspecified: Secondary | ICD-10-CM

## 2011-07-04 DIAGNOSIS — R509 Fever, unspecified: Secondary | ICD-10-CM

## 2011-07-04 DIAGNOSIS — J209 Acute bronchitis, unspecified: Secondary | ICD-10-CM

## 2011-07-04 DIAGNOSIS — R05 Cough: Secondary | ICD-10-CM

## 2011-07-04 HISTORY — DX: Essential (primary) hypertension: I10

## 2011-07-04 LAB — POCT INFLUENZA A/B
Influenza A, POC: NEGATIVE
Influenza B, POC: NEGATIVE

## 2011-07-04 LAB — POCT URINALYSIS DIP (MANUAL ENTRY)
Bilirubin, UA: NEGATIVE
Glucose, UA: 500
Leukocytes, UA: NEGATIVE
Nitrite, UA: NEGATIVE
Protein Ur, POC: NEGATIVE
Spec Grav, UA: 1.015 (ref 1.005–1.03)
Urobilinogen, UA: 1 (ref 0–1)
pH, UA: 7 (ref 5–8)

## 2011-07-04 MED ORDER — GUAIFENESIN-CODEINE 100-10 MG/5ML PO SYRP: 5.0000 mL | ORAL_SOLUTION | Freq: Four times a day (QID) | ORAL | Status: AC | PRN

## 2011-07-04 MED ORDER — LEVOFLOXACIN 750 MG PO TABS: 750.0000 mg | ORAL_TABLET | Freq: Every day | ORAL | Status: AC

## 2011-07-04 NOTE — ED Provider Notes (Signed)
History     CSN: [id number]  Arrival date & time 07/04/11  1811   First MD Initiated Contact with Patient 07/04/11 1816      Chief Complaint  Patient presents with  . Cough  . Generalized Body Aches  . Fever    (Consider location/radiation/quality/duration/timing/severity/associated sxs/prior treatment) HPI Derek [name] is a 66 y.o. male who complains of onset of cold symptoms for 10 days, worse last few days.  The symptoms are constant and moderate in severity.  He did get a flu shot last year but feels like he does have the flu now.  He is diabetic and has been taking his medicine as directed. + sore throat ++ cough No pleuritic pain + nasal congestion + post-nasal drainage ++ sinus pain/pressure + chest congestion No itchy/red eyes No earache No hemoptysis No SOB + fever x 2 days No nausea No vomiting No abdominal pain No diarrhea No skin rashes + fatigue + myalgias + headache    Past Medical History  Diagnosis Date  . Hypertension   . Diabetes mellitus   . Kidney stone     Past Surgical History  Procedure Date  . Knee surgery   . Shoulder surgery   . Joint replacement   . Hernia repair     History reviewed. No pertinent family history.  History  Substance Use Topics  . Smoking status: Former Research scientist (life sciences)  . Smokeless tobacco: Not on file  . Alcohol Use: No      Review of Systems  All other systems reviewed and are negative.    Allergies  Quinine  Home Medications   Current Outpatient Rx  Name Route Sig Dispense Refill  . ASPIRIN 81 MG PO TABS Oral Take 81 mg by mouth daily.    Marland Kitchen DIPHENHYDRAMINE HCL (SLEEP) 25 MG PO TABS Oral Take 25 mg by mouth at bedtime as needed.    Marland Kitchen KETOPROFEN ER 200 MG PO CP24 Oral Take 200 mg by mouth.    Marland Kitchen LISINOPRIL 10 MG PO TABS Oral Take 10 mg by mouth daily.    Marland Kitchen LORATADINE 10 MG PO TABS Oral Take 10 mg by mouth daily.    Marland Kitchen METFORMIN HCL 1000 MG PO TABS Oral Take 1,000 mg by mouth 2 (two) times daily with a meal.     . POTASSIUM PO Oral Take by mouth.    . GUAIFENESIN-CODEINE 100-10 MG/5ML PO SYRP Oral Take 5 mLs by mouth 4 (four) times daily as needed for cough or congestion. 120 mL 0  . LEVOFLOXACIN 750 MG PO TABS Oral Take 1 tablet (750 mg total) by mouth daily. 7 tablet 0    BP 114/75  Pulse 114  Temp(Src) 101.9 F (38.8 C) (Oral)  Resp 20  Ht '6\' 1"'$  (1.854 m)  Wt 265 lb 8 oz (120.43 kg)  BMI 35.03 kg/m2  SpO2 97%  Physical Exam  Nursing note and vitals reviewed. Constitutional: He is oriented to person, place, and time. He appears well-developed and well-nourished.  Non-toxic appearance. He appears ill.  HENT:  Head: Normocephalic and atraumatic.  Right Ear: Tympanic membrane, external ear and ear canal normal.  Left Ear: Tympanic membrane, external ear and ear canal normal.  Nose: Mucosal edema and rhinorrhea present.  Mouth/Throat: No oropharyngeal exudate, posterior oropharyngeal edema or posterior oropharyngeal erythema.  Eyes: No scleral icterus.  Neck: Neck supple. No rigidity.  Cardiovascular: Normal rate, regular rhythm and normal heart sounds.   Pulmonary/Chest: Effort normal and breath sounds normal. No respiratory  distress. He has no decreased breath sounds. He has no wheezes. He has no rhonchi.  Neurological: He is alert and oriented to person, place, and time.  Skin: Skin is warm and dry. No rash noted.  Psychiatric: He has a normal mood and affect. His speech is normal.    ED Course  Procedures (including critical care time)   1. Cough   2. Acute bronchitis      MDM  A rapid flu test was done in clinic is negative.  A urinalysis was also done in clinic which didn't show any bacterial infection.  A chest x-ray was then ordered and read by the radiologist as no acute cardiopulmonary process.  Despite that, he does appear quite sick and therefore I'm going to give him a prescription for Levaquin and codeine cough medicine. Use nasal saline solution (over the counter)  at least 3 times a day. Can take tylenol every 6 hours for pain or fever.  Follow up with your primary doctor if no improvement in 5-7 days, sooner if increasing pain, fever, or new symptoms.       Janeann Forehand, MD 07/04/11 [phone]

## 2011-07-04 NOTE — ED Notes (Signed)
Pt c/o fever, cough, sinus congestion, body aches and kidney pain x 2wks, worse x 2 days.

## 2011-11-03 ENCOUNTER — Encounter: Payer: Self-pay | Admitting: Pulmonary Disease

## 2011-11-03 ENCOUNTER — Ambulatory Visit (INDEPENDENT_AMBULATORY_CARE_PROVIDER_SITE_OTHER): Payer: Medicare Other | Admitting: Pulmonary Disease

## 2011-11-03 VITALS — BP 102/62 | HR 68 | Temp 97.8°F | Ht 73.0 in | Wt 271.6 lb

## 2011-11-03 DIAGNOSIS — R05 Cough: Secondary | ICD-10-CM

## 2011-11-03 DIAGNOSIS — R059 Cough, unspecified: Secondary | ICD-10-CM

## 2011-11-03 DIAGNOSIS — R053 Chronic cough: Secondary | ICD-10-CM

## 2011-11-03 MED ORDER — BENZONATATE 100 MG PO CAPS: ORAL_CAPSULE | ORAL | Status: DC

## 2011-11-03 MED ORDER — HYDROCOD POLST-CPM POLST ER 10-8 MG PO CP12: ORAL_CAPSULE | ORAL | Status: DC

## 2011-11-03 NOTE — Progress Notes (Signed)
  Subjective:    Patient ID: Derek [name], male    DOB: Sep 14, 1945, 66 y.o.   MRN: [id number]  HPI The patient is a 66 year old male who I've been asked to see for chronic cough.  The patient states that he has had a cough for years, but feels that it is progressively worse over the last 6 months.  Ironically, he also states that he has been on lisinopril for approximately 6 months.  The cough is dry and hacking in nature, and is much worse at night.  He didn't have classic cyclical coughing, and this can lead to regurgitation.  He admits to having a severe tickle in his throat, which leads to constant throat clearing.  He does have a history of reflux disease for which he is on b.i.d. Proton pump inhibitor.  He also has a history of sinus disease, and currently is having a lot of postnasal drip.  He has no history of asthma, but did smoke for about 17 years.  He tells me that he had a recent chest x-ray within 6-12 months, and was told that it was clear.  He has never had pulmonary function studies.   Review of Systems  Constitutional: Negative for fever and unexpected weight change.  HENT: Positive for congestion, rhinorrhea and postnasal drip. Negative for ear pain, nosebleeds, sore throat, sneezing, trouble swallowing, dental problem and sinus pressure.   Eyes: Positive for redness and itching.  Respiratory: Positive for cough, chest tightness, shortness of breath and wheezing.   Cardiovascular: Negative for palpitations and leg swelling.  Gastrointestinal: Negative for nausea and vomiting.  Genitourinary: Negative for dysuria.  Musculoskeletal: Negative for joint swelling.  Skin: Negative for rash.  Neurological: Positive for headaches.  Hematological: Bruises/bleeds easily.  Psychiatric/Behavioral: Positive for dysphoric mood. The patient is nervous/anxious.        Objective:   Physical Exam Constitutional:  Obese male, no acute distress  HENT:  Nares patent without  discharge  Oropharynx without exudate, palate and uvula are elongated.   Eyes:  Perrla, eomi, no scleral icterus  Neck:  No JVD, no TMG  Cardiovascular:  Normal rate, regular rhythm, no rubs or gallops.  No murmurs        Intact distal pulses  Pulmonary :  Normal breath sounds, no stridor or respiratory distress   No rales, rhonchi, or wheezing  Abdominal:  Soft, nondistended, bowel sounds present.  No tenderness noted.   Musculoskeletal:  No lower extremity edema noted.  Lymph Nodes:  No cervical lymphadenopathy noted  Skin:  No cyanosis noted  Neurologic:  Alert, appropriate, moves all 4 extremities without obvious deficit.         Assessment & Plan:

## 2011-11-03 NOTE — Patient Instructions (Addendum)
See cyclical cough protocol No throat clearing!! Use hard candy from sunup to sundown. Minimize voice use. Stop lisinopril, and can take benicar 20mg  1/2 tab in its place until you speak with ordering physician. Stay on acid reflux medication.  Please call me in one week with how things are going.

## 2011-11-03 NOTE — Assessment & Plan Note (Signed)
The patient's cough sounds more upper airway in origin than lower.  His symptoms are classic for cyclical coughing, and it is clearly being driven by constant throat clearing.  It is unclear how much postnasal drip or reflux may be contributing to this, but we need to continue aggressive treatment for these entities.  He is also on an ACE inhibitor, and he will need to come off this medication and not restart with his history of chronic cough for years.  We will treat him with a cyclical call protocol while doing all of this, and I have reviewed the various behavioral therapies which can help with his throat clearing.  I have expressed the difficulties in treating chronic cough, and that both he and I need to be patient since it often takes months to improve.

## 2011-11-15 ENCOUNTER — Telehealth: Payer: Self-pay | Admitting: Pulmonary Disease

## 2011-11-15 MED ORDER — TRAMADOL HCL 50 MG PO TABS: 50.0000 mg | ORAL_TABLET | Freq: Four times a day (QID) | ORAL | Status: DC | PRN

## 2011-11-15 NOTE — Telephone Encounter (Signed)
Make sure he understands the narcotic was only to get his cough from a 10 down to a 4-5.  The other things we discussed are very important and must be continued in order to get his cough from a 4-5 to a 1-2 OVER TIME.  There is no quick fix for chronic cough. We can call in tramadol 50mg  one every 6 hrs if needed for SEVERE COUGH, but he needs to continue with all the other recommendations from his last visit.  #30, no fills. He needs ov with me in 2-3 weeks.

## 2011-11-15 NOTE — Telephone Encounter (Signed)
Called and spoke with patients wife, states patients cough has returned approx. 2 days after stopping the tussicaps.  She states tessalon perles do not help, the tussi caps seem to help but the are "$10 per pill, and not covered by their insurance." Requesting a rec for something that Dr. Shelle Iron feels will help and is also affordable. Dr. Shelle Iron please advise, thank you!

## 2011-11-15 NOTE — Telephone Encounter (Signed)
I spoke with spouse and is aware of KC recs. He is scheduled to see [facility] 12/06/11. rx has been sent to the pharmacy

## 2011-11-17 ENCOUNTER — Telehealth: Payer: Self-pay | Admitting: Pulmonary Disease

## 2011-11-17 NOTE — Telephone Encounter (Signed)
lmomtcb x1 for spouse 

## 2011-11-20 NOTE — Telephone Encounter (Signed)
The tussicaps ARE a narcotic and much worse than tramadol for altering mentation.  I would stick to behavioral therapies, and use tramadol at night, and keep f/u apptm with me.

## 2011-11-20 NOTE — Telephone Encounter (Signed)
ATC patient, no answer LMOMTCB 

## 2011-11-20 NOTE — Telephone Encounter (Signed)
lmomtcb  

## 2011-11-20 NOTE — Telephone Encounter (Signed)
Make sure they realize that tramadol is not a "pain med" the way they think of pain meds.  It is NOT a narcotic, and has much less side effects than many of those.

## 2011-11-20 NOTE — Telephone Encounter (Signed)
Called, spoke with pt's spouse.  States they did not realize at first that tramadol was a pain medication.  States d/t pt having Meniere's Disease, pain meds "knock him for a loop."  Reports he becomes swimmy headed, dizzy, and nauseated with pain meds.  States pt is only taking tramadol 1 tablet qhs with relief on the night cough.  Would like KC to be aware of this. Will keep OV with [facility] on Oct 16 and will call back if anything is needed prior to this.  Will forward msg to [facility] as FYI.

## 2011-11-20 NOTE — Telephone Encounter (Signed)
Pt's wife returned triage's call.   Holly D Pryor ° °

## 2011-11-20 NOTE — Telephone Encounter (Signed)
I spoke with spouse and is is aware of this. She is wanting to know though what else you would recommend. The tussicaps helped but it is too expensive for them, Please advise KC thanks

## 2011-11-21 NOTE — Telephone Encounter (Signed)
ATC number provided; line disconnected.  Called alternate number, spoke with patient and advised of KC's recs as documented below.  Pt stated that he is tolerating the tramadol well at bedtime and will follow KC's other recommendations.  Pt to keep his 10.16.13 appt w/ KC and call sooner if needed.  Will sign off.

## 2011-12-06 ENCOUNTER — Ambulatory Visit (INDEPENDENT_AMBULATORY_CARE_PROVIDER_SITE_OTHER): Payer: Medicare Other | Admitting: Pulmonary Disease

## 2011-12-06 ENCOUNTER — Encounter: Payer: Self-pay | Admitting: Pulmonary Disease

## 2011-12-06 VITALS — BP 148/82 | HR 81 | Ht 74.0 in | Wt 274.6 lb

## 2011-12-06 DIAGNOSIS — R05 Cough: Secondary | ICD-10-CM

## 2011-12-06 DIAGNOSIS — R059 Cough, unspecified: Secondary | ICD-10-CM

## 2011-12-06 DIAGNOSIS — R053 Chronic cough: Secondary | ICD-10-CM

## 2011-12-06 NOTE — Progress Notes (Signed)
  Subjective:    Patient ID: Derek [name], male    DOB: 1945-05-27, 66 y.o.   MRN: [id number]  HPI The patient comes in today for followup of his chronic cough.  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  He was changed to an ARB, placed on the cyclical call protocol, and also counseled on behavioral therapies for cyclical coughing.  He returns today where his cough is over 90% improved.  He is still taking one tramadol at bedtime each night, but I have asked him to discontinue this.  He is very happy with his progress.   Review of Systems  Constitutional: Negative for fever and unexpected weight change.  HENT: Positive for rhinorrhea. Negative for ear pain, nosebleeds, congestion, sore throat, sneezing, trouble swallowing, dental problem, postnasal drip and sinus pressure.   Eyes: Negative for redness and itching.  Respiratory: Positive for wheezing. Negative for cough, chest tightness and shortness of breath.   Cardiovascular: Negative for palpitations and leg swelling.  Gastrointestinal: Negative for nausea and vomiting.  Genitourinary: Negative for dysuria.  Musculoskeletal: Negative for joint swelling.  Skin: Negative for rash.  Neurological: Positive for headaches.  Hematological: Does not bruise/bleed easily.  Psychiatric/Behavioral: Positive for dysphoric mood. The patient is nervous/anxious.        Objective:   Physical Exam Obese male in no acute distress Nose without purulence or discharge noted Chest totally clear to auscultation, no wheezing Cardiac exam with regular rate and rhythm Lower extremities without edema or cyanosis Alert and oriented, moves all 4 extremities.       Assessment & Plan:

## 2011-12-06 NOTE — Patient Instructions (Addendum)
Continue with the behavioral therapies we discussed to keep your cough from escalating if it becomes more of a problem Try stopping the tramadol at bedtime to see how cough does.  If returns, may need to stay on a little longer. followup with me as needed.

## 2011-12-06 NOTE — Assessment & Plan Note (Signed)
The patient has seen a 90% and greater improvement in his cough since being off his ACE inhibitor, and also being on behavioral therapies for cyclical coughing.  He is taking tramadol one at bedtime currently, and I have asked him to stop this to see if he continues to do well.  The patient has had the cough for decades, and it is very unlikely that I will ever 100% resolve his cough.  His job will be to work on behavioral therapies to keep his cough from escalating back into a cyclical mechanism.  He will followup with me as needed.

## 2011-12-18 ENCOUNTER — Other Ambulatory Visit (HOSPITAL_COMMUNITY): Payer: Self-pay | Admitting: Orthopedic Surgery

## 2011-12-18 DIAGNOSIS — M25562 Pain in left knee: Secondary | ICD-10-CM

## 2011-12-26 ENCOUNTER — Encounter: Payer: Self-pay | Admitting: Cardiology

## 2011-12-27 ENCOUNTER — Encounter (HOSPITAL_COMMUNITY)
Admission: RE | Admit: 2011-12-27 | Discharge: 2011-12-27 | Disposition: A | Discharge status: Home / Self Care | Payer: Medicare Other | Source: Ambulatory Visit | Attending: Orthopedic Surgery | Admitting: Orthopedic Surgery

## 2011-12-27 DIAGNOSIS — M25569 Pain in unspecified knee: Secondary | ICD-10-CM | POA: Insufficient documentation

## 2011-12-27 DIAGNOSIS — M25562 Pain in left knee: Secondary | ICD-10-CM

## 2011-12-27 MED ORDER — TECHNETIUM TC 99M MEDRONATE IV KIT
25.0000 | PACK | Freq: Once | INTRAVENOUS | Status: AC | PRN
  Administered 2011-12-27: 25 via INTRAVENOUS

## 2012-02-09 ENCOUNTER — Other Ambulatory Visit: Payer: Self-pay | Admitting: Orthopedic Surgery

## 2012-02-09 MED ORDER — DEXAMETHASONE SODIUM PHOSPHATE 10 MG/ML IJ SOLN: 10.0000 mg | Freq: Once | INTRAMUSCULAR | Status: DC

## 2012-02-09 NOTE — Progress Notes (Signed)
Preoperative surgical orders have been place into the [facility] hospital system for [name] on 02/09/2012, 5:24 PM  by Patrica Duel for surgery on 03/01/2012.  Preop Knee Scope orders including IV Tylenol and IV Decadron as long as there are no contraindications to the above medications. Avel Peace, PA-C

## 2012-02-22 ENCOUNTER — Encounter: Payer: Self-pay | Admitting: *Deleted

## 2012-02-22 ENCOUNTER — Emergency Department
Admission: EM | Admit: 2012-02-22 | Discharge: 2012-02-22 | Disposition: A | Discharge status: Home / Self Care | Payer: Medicare Other | Source: Home / Self Care | Attending: Emergency Medicine | Admitting: Emergency Medicine

## 2012-02-22 DIAGNOSIS — J069 Acute upper respiratory infection, unspecified: Secondary | ICD-10-CM

## 2012-02-22 DIAGNOSIS — J329 Chronic sinusitis, unspecified: Secondary | ICD-10-CM

## 2012-02-22 MED ORDER — AMOXICILLIN-POT CLAVULANATE 875-125 MG PO TABS: 1.0000 | ORAL_TABLET | Freq: Two times a day (BID) | ORAL | Status: DC

## 2012-02-22 NOTE — ED Notes (Signed)
Pt c/o sinus pain and pressure x 3-4 days. He was recently treated for a dental abscess. He also reports taking oxycodone today for dental pain.

## 2012-02-22 NOTE — ED Provider Notes (Signed)
History     CSN: [id number]  Arrival date & time 02/22/12  4098   First MD Initiated Contact with Patient 02/22/12 1839      Chief Complaint  Patient presents with  . Facial Pain    (Consider location/radiation/quality/duration/timing/severity/associated sxs/prior treatment) HPI Derek [name] is a 67 y.o. male who complains of onset of cold symptoms for 4 days.  The symptoms are constant and mild-moderate in severity.  He had a L sided dental infection a month ago and was on Biaxin which helped.   No sore throat No cough No pleuritic pain No wheezing + nasal congestion + post-nasal drainage ++ R sided sinus pain/pressure with teeth pain No chest congestion No itchy/red eyes No earache No hemoptysis No SOB No chills/sweats No fever No nausea No vomiting No abdominal pain No diarrhea No skin rashes No fatigue No myalgias No headache    Past Medical History  Diagnosis Date  . Hypertension   . Diabetes mellitus   . Kidney stone     Past Surgical History  Procedure Date  . Knee surgery   . Shoulder surgery     x3  . Joint replacement   . Hernia repair     x3    History reviewed. No pertinent family history.  History  Substance Use Topics  . Smoking status: Former Smoker -- 4.0 packs/day for 17 years    Types: Cigarettes    Quit date: 02/21/1975  . Smokeless tobacco: Not on file  . Alcohol Use: No      Review of Systems  All other systems reviewed and are negative.    Allergies  Quinine  Home Medications   Current Outpatient Rx  Name  Route  Sig  Dispense  Refill  . ASPIRIN 81 MG PO TABS   Oral   Take 81 mg by mouth daily.         Marland Kitchen BENICAR 20 MG PO TABS               . BENZONATATE 100 MG PO CAPS      Take 2 po q6 hours prn   30 capsule   1   . CARVEDILOL 25 MG PO TABS   Oral   Take 25 mg by mouth 2 (two) times daily with a meal.          . CLOBETASOL PROPIONATE 0.05 % EX OINT               . VITAMIN B 12 PO   Oral  Take by mouth.         Marland Kitchen HYDROCOD POLST-CPM POLST ER 10-8 MG PO CP12      Take 1 tablet Q 12 hours   10 each   0   . IPRATROPIUM BROMIDE 0.06 % NA SOLN               . KETOPROFEN ER 200 MG PO CP24   Oral   Take 200 mg by mouth.         . L-LYSINE 1000 MG PO TABS   Oral   Take by mouth.         Marland Kitchen LANSOPRAZOLE 30 MG PO CPDR   Oral   Take 30 mg by mouth 2 (two) times daily.          Marland Kitchen METFORMIN HCL 1000 MG PO TABS   Oral   Take 1,000 mg by mouth 2 (two) times daily with a meal.         .  POTASSIUM PO   Oral   Take by mouth.         Marland Kitchen SIMVASTATIN 20 MG PO TABS               . TRAMADOL HCL 50 MG PO TABS   Oral   Take 1 tablet (50 mg total) by mouth every 6 (six) hours as needed. For SEVERE COUGH   30 tablet   0     BP 142/86  Pulse 78  Temp 97.4 F (36.3 C) (Oral)  Resp 18  Wt 263 lb (119.296 kg)  SpO2 96%  Physical Exam  Nursing note and vitals reviewed. Constitutional: He is oriented to person, place, and time. He appears well-developed and well-nourished.  HENT:  Head: Normocephalic and atraumatic.  Right Ear: Tympanic membrane, external ear and ear canal normal.  Left Ear: Tympanic membrane, external ear and ear canal normal.  Nose: Mucosal edema and rhinorrhea present. Right sinus exhibits maxillary sinus tenderness. Right sinus exhibits no frontal sinus tenderness. Left sinus exhibits no maxillary sinus tenderness and no frontal sinus tenderness.  Mouth/Throat: Posterior oropharyngeal erythema present. No oropharyngeal exudate or posterior oropharyngeal edema.  Eyes: No scleral icterus.  Neck: Neck supple.  Cardiovascular: Regular rhythm and normal heart sounds.   Pulmonary/Chest: Effort normal and breath sounds normal. No respiratory distress.  Neurological: He is alert and oriented to person, place, and time.  Skin: Skin is warm and dry.  Psychiatric: He has a normal mood and affect. His speech is normal.    ED Course    Procedures (including critical care time)  Labs Reviewed - No data to display No results found.   1. Acute upper respiratory infections of unspecified site   2. Sinusitis       MDM  1)  Take the prescribed antibiotic as instructed (Augmentin) 2)  Use nasal saline solution (over the counter) at least 3 times a day. 3)  Use over the counter decongestants like Zyrtec-D every 12 hours as needed to help with congestion.  If you have hypertension, do not take medicines with sudafed.  4)  Can take tylenol every 6 hours or motrin every 8 hours for pain or fever. 5)  Follow up with your primary doctor if no improvement in 5-7 days, sooner if increasing pain, fever, or new symptoms.       Marlaine Hind, MD 02/22/12 1907

## 2012-02-27 NOTE — Progress Notes (Signed)
To [facility] at 0700- Istat,Ekg on arrival-Npo after Mn-will take coreg,prevacid with small amt water that am.do not take metformin that morning.

## 2012-02-29 ENCOUNTER — Encounter (HOSPITAL_BASED_OUTPATIENT_CLINIC_OR_DEPARTMENT_OTHER): Payer: Self-pay | Admitting: *Deleted

## 2012-02-29 NOTE — H&P (Signed)
CC- Derek [name] is a 67 y.o. male who presents with left knee pain.  HPI- . Knee Pain: Patient presents with knee pain involving the  left knee. Onset of the symptoms was several months ago. Inciting event: none known. Current symptoms include crepitus sensation. Pain is aggravated by going up and down stairs and rising after sitting.  Patient has had prior knee problems. Evaluation to date: bone scan negative for loosening. Treatment to date: none.  Past Medical History  Diagnosis Date  . Hypertension   . Diabetes mellitus   . Kidney stone     Past Surgical History  Procedure Date  . Knee surgery 2002    rt knee  . Shoulder surgery '93bilateral '94 lt    x3  . Joint replacement 2009    knee-left  . Hernia repair 2000    umbilical  . Inguinal hernia repair '74,'82    Prior to Admission medications   Medication Sig Start Date End Date Taking? Authorizing Provider  losartan (COZAAR) 50 MG tablet Take 50 mg by mouth daily.   Yes Historical Provider, MD  amoxicillin-clavulanate (AUGMENTIN) 875-125 MG per tablet Take 1 tablet by mouth 2 (two) times daily. 02/22/12   Marlaine Hind, MD  aspirin 81 MG tablet Take 81 mg by mouth daily.    Historical Provider, MD  BENICAR 20 MG tablet  12/04/11   Historical Provider, MD  benzonatate (TESSALON PERLES) 100 MG capsule Take 2 po q6 hours prn 11/03/11   Barbaraann Share, MD  carvedilol (COREG) 25 MG tablet Take 25 mg by mouth 2 (two) times daily with a meal.  09/26/11   Historical Provider, MD  clobetasol ointment (TEMOVATE) 0.05 %  11/06/11   Historical Provider, MD  Cyanocobalamin (VITAMIN B 12 PO) Take by mouth.    Historical Provider, MD  Hydrocod Polst-Chlorphen Polst (TUSSICAPS) 10-8 MG CP12 Take 1 tablet Q 12 hours 11/03/11   Barbaraann Share, MD  ipratropium (ATROVENT) 0.06 % nasal spray  10/12/11   Historical Provider, MD  Ketoprofen CR (KETOPROFEN CR) 200 MG CP24 Take 75 mg by mouth.     Historical Provider, MD  L-Lysine 1000 MG TABS  Take by mouth.    Historical Provider, MD  lansoprazole (PREVACID) 30 MG capsule Take 30 mg by mouth 2 (two) times daily.  08/16/11   Historical Provider, MD  metFORMIN (GLUCOPHAGE) 1000 MG tablet Take 1,000 mg by mouth 2 (two) times daily with a meal. 1500 mg    Historical Provider, MD  POTASSIUM PO Take by mouth.    Historical Provider, MD  simvastatin (ZOCOR) 20 MG tablet  11/17/11   Historical Provider, MD  traMADol (ULTRAM) 50 MG tablet Take 1 tablet (50 mg total) by mouth every 6 (six) hours as needed. For SEVERE COUGH 11/15/11   Barbaraann Share, MD   KNEE EXAM antalgic gait, collateral ligaments intact, crepitus on range of motion of knee  Physical Examination: General appearance - alert, well appearing, and in no distress Mental status - alert, oriented to person, place, and time Chest - clear to auscultation, no wheezes, rales or rhonchi, symmetric air entry Heart - normal rate, regular rhythm, normal S1, S2, no murmurs, rubs, clicks or gallops Abdomen - soft, nontender, nondistended, no masses or organomegaly Neurological - alert, oriented, normal speech, no focal findings or movement disorder noted   Asessment/Plan--- Left knee hypertrophic synovitis- - Plan left knee arthroscopy with synovectomy . Procedure risks and potential comps discussed with patient  who elects to proceed. Goals are decreased pain and increased function with a high likelihood of achieving both

## 2012-03-01 ENCOUNTER — Ambulatory Visit (HOSPITAL_BASED_OUTPATIENT_CLINIC_OR_DEPARTMENT_OTHER): Payer: Medicare Other | Admitting: Anesthesiology

## 2012-03-01 ENCOUNTER — Ambulatory Visit (HOSPITAL_BASED_OUTPATIENT_CLINIC_OR_DEPARTMENT_OTHER)
Admission: RE | Admit: 2012-03-01 | Discharge: 2012-03-01 | Disposition: A | Discharge status: Home / Self Care | Payer: Medicare Other | Source: Ambulatory Visit | Attending: Orthopedic Surgery | Admitting: Orthopedic Surgery

## 2012-03-01 ENCOUNTER — Encounter (HOSPITAL_BASED_OUTPATIENT_CLINIC_OR_DEPARTMENT_OTHER): Payer: Self-pay | Admitting: *Deleted

## 2012-03-01 ENCOUNTER — Encounter (HOSPITAL_BASED_OUTPATIENT_CLINIC_OR_DEPARTMENT_OTHER): Payer: Self-pay | Admitting: Anesthesiology

## 2012-03-01 ENCOUNTER — Encounter (HOSPITAL_BASED_OUTPATIENT_CLINIC_OR_DEPARTMENT_OTHER)
Admission: RE | Disposition: A | Discharge status: Home / Self Care | Payer: Self-pay | Source: Ambulatory Visit | Attending: Orthopedic Surgery

## 2012-03-01 DIAGNOSIS — Z96659 Presence of unspecified artificial knee joint: Secondary | ICD-10-CM | POA: Insufficient documentation

## 2012-03-01 DIAGNOSIS — M659 Unspecified synovitis and tenosynovitis, unspecified site: Secondary | ICD-10-CM | POA: Insufficient documentation

## 2012-03-01 DIAGNOSIS — Z79899 Other long term (current) drug therapy: Secondary | ICD-10-CM | POA: Insufficient documentation

## 2012-03-01 DIAGNOSIS — E119 Type 2 diabetes mellitus without complications: Secondary | ICD-10-CM | POA: Insufficient documentation

## 2012-03-01 DIAGNOSIS — I10 Essential (primary) hypertension: Secondary | ICD-10-CM | POA: Insufficient documentation

## 2012-03-01 HISTORY — PX: KNEE ARTHROSCOPY: SHX127

## 2012-03-01 LAB — POCT I-STAT, CHEM 8
BUN: 21 mg/dL (ref 6–23)
Calcium, Ion: 1.44 mmol/L — ABNORMAL HIGH (ref 1.13–1.30)
Chloride: 104 mEq/L (ref 96–112)
Creatinine, Ser: 0.8 mg/dL (ref 0.50–1.35)
Glucose, Bld: 229 mg/dL — ABNORMAL HIGH (ref 70–99)
HCT: 37 % — ABNORMAL LOW (ref 39.0–52.0)
Hemoglobin: 12.6 g/dL — ABNORMAL LOW (ref 13.0–17.0)
Potassium: 4.7 mEq/L (ref 3.5–5.1)
Sodium: 137 mEq/L (ref 135–145)
TCO2: 24 mmol/L (ref 0–100)

## 2012-03-01 LAB — GLUCOSE, CAPILLARY: Glucose-Capillary: 166 mg/dL — ABNORMAL HIGH (ref 70–99)

## 2012-03-01 SURGERY — ARTHROSCOPY, KNEE
Anesthesia: General | Site: Knee | Laterality: Left

## 2012-03-01 MED ORDER — ONDANSETRON HCL 4 MG/2ML IJ SOLN
INTRAMUSCULAR | Status: DC | PRN
  Administered 2012-03-01: 4 mg via INTRAVENOUS

## 2012-03-01 MED ORDER — CHLORHEXIDINE GLUCONATE 4 % EX LIQD
60.0000 mL | Freq: Once | CUTANEOUS | Status: DC
  Filled 2012-03-01: qty 60

## 2012-03-01 MED ORDER — PROPOFOL 10 MG/ML IV BOLUS
INTRAVENOUS | Status: DC | PRN
  Administered 2012-03-01: 200 mg via INTRAVENOUS

## 2012-03-01 MED ORDER — LIDOCAINE HCL (CARDIAC) 20 MG/ML IV SOLN
INTRAVENOUS | Status: DC | PRN
  Administered 2012-03-01: 100 mg via INTRAVENOUS

## 2012-03-01 MED ORDER — BUPIVACAINE-EPINEPHRINE 0.25% -1:200000 IJ SOLN
INTRAMUSCULAR | Status: DC | PRN
  Administered 2012-03-01: 20 mL

## 2012-03-01 MED ORDER — HYDROMORPHONE HCL PF 1 MG/ML IJ SOLN
0.2500 mg | INTRAMUSCULAR | Status: DC | PRN
  Filled 2012-03-01: qty 1

## 2012-03-01 MED ORDER — SODIUM CHLORIDE 0.9 % IR SOLN
Status: DC | PRN
  Administered 2012-03-01: 9000 mL

## 2012-03-01 MED ORDER — HYDROCODONE-ACETAMINOPHEN 5-325 MG PO TABS: 1.0000 | ORAL_TABLET | Freq: Four times a day (QID) | ORAL | Status: DC | PRN

## 2012-03-01 MED ORDER — OXYCODONE HCL 5 MG/5ML PO SOLN
5.0000 mg | Freq: Once | ORAL | Status: DC | PRN
  Filled 2012-03-01: qty 5

## 2012-03-01 MED ORDER — MEPERIDINE HCL 25 MG/ML IJ SOLN
6.2500 mg | INTRAMUSCULAR | Status: DC | PRN
  Filled 2012-03-01: qty 1

## 2012-03-01 MED ORDER — ACETAMINOPHEN 10 MG/ML IV SOLN
1000.0000 mg | Freq: Once | INTRAVENOUS | Status: AC
  Administered 2012-03-01: 1000 mg via INTRAVENOUS
  Filled 2012-03-01: qty 100

## 2012-03-01 MED ORDER — METHOCARBAMOL 500 MG PO TABS: 500.0000 mg | ORAL_TABLET | Freq: Four times a day (QID) | ORAL | Status: DC

## 2012-03-01 MED ORDER — LACTATED RINGERS IV SOLN
INTRAVENOUS | Status: DC
  Administered 2012-03-01 (×2): via INTRAVENOUS
  Filled 2012-03-01: qty 1000

## 2012-03-01 MED ORDER — ACETAMINOPHEN 10 MG/ML IV SOLN
1000.0000 mg | Freq: Once | INTRAVENOUS | Status: DC | PRN
  Filled 2012-03-01: qty 100

## 2012-03-01 MED ORDER — OXYCODONE HCL 5 MG PO TABS
5.0000 mg | ORAL_TABLET | Freq: Once | ORAL | Status: DC | PRN
  Filled 2012-03-01: qty 1

## 2012-03-01 MED ORDER — SODIUM CHLORIDE 0.9 % IV SOLN
INTRAVENOUS | Status: DC
  Filled 2012-03-01: qty 1000

## 2012-03-01 MED ORDER — DEXTROSE 5 % IV SOLN
3.0000 g | INTRAVENOUS | Status: DC
  Filled 2012-03-01: qty 3000

## 2012-03-01 MED ORDER — FENTANYL CITRATE 0.05 MG/ML IJ SOLN
INTRAMUSCULAR | Status: DC | PRN
  Administered 2012-03-01 (×2): 25 ug via INTRAVENOUS
  Administered 2012-03-01: 50 ug via INTRAVENOUS
  Administered 2012-03-01: 25 ug via INTRAVENOUS
  Administered 2012-03-01: 50 ug via INTRAVENOUS
  Administered 2012-03-01: 25 ug via INTRAVENOUS

## 2012-03-01 MED ORDER — PROMETHAZINE HCL 25 MG/ML IJ SOLN
6.2500 mg | INTRAMUSCULAR | Status: DC | PRN
  Filled 2012-03-01: qty 1

## 2012-03-01 SURGICAL SUPPLY — 38 items
BANDAGE ELASTIC 6 VELCRO ST LF (GAUZE/BANDAGES/DRESSINGS) ×2 IMPLANT
BLADE 4.2CUDA (BLADE) ×2 IMPLANT
BLADE CUDA SHAVER 3.5 (BLADE) IMPLANT
BLADE CUTTER GATOR 3.5 (BLADE) IMPLANT
CANISTER SUCT LVC 12 LTR MEDI- (MISCELLANEOUS) ×2 IMPLANT
CANISTER SUCTION 2500CC (MISCELLANEOUS) IMPLANT
CLOTH BEACON ORANGE TIMEOUT ST (SAFETY) ×2 IMPLANT
DRAPE ARTHROSCOPY W/POUCH 114 (DRAPES) ×2 IMPLANT
DRSG EMULSION OIL 3X3 NADH (GAUZE/BANDAGES/DRESSINGS) ×2 IMPLANT
DRSG PAD ABDOMINAL 8X10 ST (GAUZE/BANDAGES/DRESSINGS) ×2 IMPLANT
DURAPREP 26ML APPLICATOR (WOUND CARE) ×2 IMPLANT
ELECT MENISCUS 165MM 90D (ELECTRODE) IMPLANT
ELECT REM PT RETURN 9FT ADLT (ELECTROSURGICAL)
ELECTRODE REM PT RTRN 9FT ADLT (ELECTROSURGICAL) IMPLANT
GAUZE SPONGE 4X4 12PLY STRL LF (GAUZE/BANDAGES/DRESSINGS) ×1 IMPLANT
GLOVE BIO SURGEON STRL SZ 6.5 (GLOVE) ×1 IMPLANT
GLOVE BIO SURGEON STRL SZ7 (GLOVE) ×2 IMPLANT
GLOVE BIO SURGEON STRL SZ8 (GLOVE) ×2 IMPLANT
GLOVE ECLIPSE 6.0 STRL STRAW (GLOVE) ×1 IMPLANT
GLOVE INDICATOR 8.0 STRL GRN (GLOVE) ×2 IMPLANT
GOWN PREVENTION PLUS LG XLONG (DISPOSABLE) ×2 IMPLANT
GOWN STRL NON-REIN LRG LVL3 (GOWN DISPOSABLE) ×2 IMPLANT
IV NS IRRIG 3000ML ARTHROMATIC (IV SOLUTION) ×4 IMPLANT
KNEE WRAP E Z 3 GEL PACK (MISCELLANEOUS) ×2 IMPLANT
PACK ARTHROSCOPY DSU (CUSTOM PROCEDURE TRAY) ×2 IMPLANT
PACK BASIN DAY SURGERY FS (CUSTOM PROCEDURE TRAY) ×2 IMPLANT
PADDING CAST ABS 4INX4YD NS (CAST SUPPLIES) ×1
PADDING CAST ABS COTTON 4X4 ST (CAST SUPPLIES) ×1 IMPLANT
PADDING CAST COTTON 6X4 STRL (CAST SUPPLIES) ×2 IMPLANT
PENCIL BUTTON HOLSTER BLD 10FT (ELECTRODE) IMPLANT
SET ARTHROSCOPY TUBING (MISCELLANEOUS) ×2
SET ARTHROSCOPY TUBING LN (MISCELLANEOUS) ×1 IMPLANT
SPONGE GAUZE 4X4 12PLY (GAUZE/BANDAGES/DRESSINGS) ×2 IMPLANT
SUT ETHILON 4 0 PS 2 18 (SUTURE) ×2 IMPLANT
TOWEL OR 17X24 6PK STRL BLUE (TOWEL DISPOSABLE) ×2 IMPLANT
WAND 30 DEG SABER W/CORD (SURGICAL WAND) IMPLANT
WAND 90 DEG TURBOVAC W/CORD (SURGICAL WAND) ×1 IMPLANT
WATER STERILE IRR 500ML POUR (IV SOLUTION) ×2 IMPLANT

## 2012-03-01 NOTE — Anesthesia Procedure Notes (Addendum)
Procedure Name: LMA Insertion Date/Time: 03/01/2012 9:00 AM Performed by: Fran Lowes Pre-anesthesia Checklist: Patient identified, Emergency Drugs available, Suction available and Patient being monitored Patient Re-evaluated:Patient Re-evaluated prior to inductionOxygen Delivery Method: Circle System Utilized Preoxygenation: Pre-oxygenation with 100% oxygen Intubation Type: IV induction Ventilation: Mask ventilation without difficulty LMA: LMA inserted LMA Size: 5.0 Number of attempts: 1 Airway Equipment and Method: bite block Placement Confirmation: positive ETCO2 Tube secured with: Tape Dental Injury: Teeth and Oropharynx as per pre-operative assessment

## 2012-03-01 NOTE — Anesthesia Preprocedure Evaluation (Addendum)
Anesthesia Evaluation  Patient identified by MRN, date of birth, ID band Patient awake    Reviewed: Allergy & Precautions, H&P , NPO status , Patient's Chart, lab work & pertinent test results, reviewed documented beta blocker date and time   Airway Mallampati: II TM Distance: >3 FB Neck ROM: Full    Dental  (+) Dental Advisory Given and Partial Lower   Pulmonary former smoker,  breath sounds clear to auscultation  Pulmonary exam normal       Cardiovascular hypertension, Pt. on medications and Pt. on home beta blockers - Past MI and - CHF Rhythm:Regular Rate:Normal     Neuro/Psych negative neurological ROS  negative psych ROS   GI/Hepatic negative GI ROS, Neg liver ROS,   Endo/Other  diabetes, Poorly Controlled, Type 2, Oral Hypoglycemic AgentsMorbid obesity  Renal/GU Renal disease     Musculoskeletal negative musculoskeletal ROS (+)   Abdominal (+) + obese,   Peds  Hematology negative hematology ROS (+)   Anesthesia Other Findings   Reproductive/Obstetrics                         Anesthesia Physical Anesthesia Plan  ASA: III  Anesthesia Plan: General   Post-op Pain Management:    Induction: Intravenous  Airway Management Planned: LMA  Additional Equipment:   Intra-op Plan:   Post-operative Plan: Extubation in OR  Informed Consent: I have reviewed the patients History and Physical, chart, labs and discussed the procedure including the risks, benefits and alternatives for the proposed anesthesia with the patient or authorized representative who has indicated his/her understanding and acceptance.   Dental advisory given  Plan Discussed with: CRNA  Anesthesia Plan Comments:        Anesthesia Quick Evaluation

## 2012-03-01 NOTE — Interval H&P Note (Signed)
History and Physical Interval Note:  03/01/2012 8:53 AM  Derek [name]  has presented today for surgery, with the diagnosis of LEFT KNEE HYPERTROPHIC SYNOVITIS  The various methods of treatment have been discussed with the patient and family. After consideration of risks, benefits and other options for treatment, the patient has consented to  Procedure(s) (LRB) with comments: ARTHROSCOPY KNEE (Left) - WITH SYNOVECTOMY as a surgical intervention .  The patient's history has been reviewed, patient examined, no change in status, stable for surgery.  I have reviewed the patient's chart and labs.  Questions were answered to the patient's satisfaction.     Loanne Drilling

## 2012-03-01 NOTE — Brief Op Note (Signed)
03/01/2012  9:29 AM  PATIENT:  Benjamine Mola  67 y.o. male  PRE-OPERATIVE DIAGNOSIS:  LEFT KNEE HYPERTROPHIC SYNOVITIS  POST-OPERATIVE DIAGNOSIS:  LEFT KNEE HYPERTROPHIC SYNOVITIS  PROCEDURE:  Procedure(s) (LRB) with comments: ARTHROSCOPY KNEE (Left) - WITH SYNOVECTOMY   SURGEON:  Surgeon(s) and Role:    * Loanne Drilling, MD - Primary  PHYSICIAN ASSISTANT:   ASSISTANTS: none   ANESTHESIA:   general  EBL:  Total I/O In: 1000 [I.V.:1000] Out: -   BLOOD ADMINISTERED:none  DRAINS: none   LOCAL MEDICATIONS USED:  MARCAINE     COUNTS:  YES  TOURNIQUET:  * Missing tourniquet times found for documented tourniquets in log:  40981 *  DICTATION: .Other Dictation: Dictation Number [id number]  PLAN OF CARE: Discharge to home after PACU  PATIENT DISPOSITION:  PACU - hemodynamically stable.

## 2012-03-01 NOTE — Op Note (Unsigned)
NAME:  [name], Derek              ACCOUNT NO.:  [id number]  MEDICAL RECORD NO.:  [id number]  LOCATION:                                 FACILITY:  PHYSICIAN:  Kawon Willcutt, M.D.    DATE OF BIRTH:  10/31/1945  DATE OF PROCEDURE:  03/01/2012 DATE OF DISCHARGE:                              OPERATIVE REPORT   PREOPERATIVE DIAGNOSIS:  Hypertrophic synovitis, left knee.  POSTOPERATIVE DIAGNOSIS:  Hypertrophic synovitis, left knee.  PROCEDURE:  Left knee arthroscopy with synovectomy.  SURGEON:  Kyndall Chaplin, MD  ASSISTANT:  None.  ANESTHESIA:  General.  ESTIMATED BLOOD LOSS:  Minimal.  DRAINS:  None.  COMPLICATIONS:  None.  CONDITION:  Stable to recovery.  BRIEF CLINICAL NOTE:  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  Has developed painful popping in the knee.  He had a bone scan, which ruled out any loosening of his components.  He presents now for arthroscopy and synovectomy.  PROCEDURE IN DETAIL:  After successful administration of general anesthetic, a tourniquet was placed on his left thigh and his left lower extremity was prepped and draped in the usual sterile fashion.  Standard superomedial and inferolateral incisions were made, inflow cannula passed, superomedial camera passed inferolateral.  Arthroscopic visualization proceeds.  There is a large amount of hypertrophic tissue at the junction of the quad tendon and the patellar component.  This obliterates the suprapatellar pouch.  A superolateral incision was made with the 11 blade and then using a combination of a shaver and the ArthroCare device, the tissue was debrided back to normal tissue to essentially clear out the suprapatellar region.  There was also some tissue in the medial and lateral gutters which was debrided.  Inferiorly it looks fine.  The components were all in good position and no signs of any loosening.  The joints again inspected.   No other abnormal tissue was noted.  The arthroscopic equipment was removed from the lateral portals, which were closed with interrupted 4-0 nylon.  A 20 mL of 0.25% Marcaine with epinephrine was injected through the inflow cannula and that was removed and that portal closed with nylon.  Incisions were cleaned and dried and a bulky sterile dressing applied.  He was then awakened and transported to recovery in stable condition.     Aline Wesche, M.D.     FA/MEDQ  D:  03/01/2012  T:  03/01/2012  Job:  [id number] 

## 2012-03-01 NOTE — Progress Notes (Signed)
1100  Patient c/o having difficulty "catching his breath and of shivering."  He stated he thinks he is having a panic attack.  Dr. Renold Don notified and in to see pt.   VSS.  HR 64  Bp 136/84  o2 sat 66% Resp 22 .  Warm blankets applied.  Denied chest pain, n/v or diaphoresis.  After  10 mim.  symptoms subsided and pt. Stated he "was feeling normal."  Will cont. to monitor.

## 2012-03-01 NOTE — Transfer of Care (Signed)
Immediate Anesthesia Transfer of Care Note  Patient: Derek [name]  Procedure(s) Performed: Procedure(s) (LRB): ARTHROSCOPY KNEE (Left)  Patient Location: Patient transported to PACU with oxygen via face mask at 4 Liters / Min  Anesthesia Type: General  Level of Consciousness: awake and alert   Airway & Oxygen Therapy: Patient Spontanous Breathing and Patient connected to face mask oxygen  Post-op Assessment: Report given to PACU RN and Post -op Vital signs reviewed and stable  Post vital signs: Reviewed and stable  Dentition: Teeth and oropharynx remain in pre-op condition  Complications: No apparent anesthesia complications

## 2012-03-01 NOTE — Discharge Instructions (Signed)
Arthroscopic Procedure, Knee An arthroscopic procedure can find what is wrong with your knee. PROCEDURE Arthroscopy is a surgical technique that allows your orthopedic surgeon to diagnose and treat your knee injury with accuracy. They will look into your knee through a small instrument. This is almost like a small (pencil sized) telescope. Because arthroscopy affects your knee less than open knee surgery, you can anticipate a more rapid recovery. Taking an active role by following your caregiver's instructions will help with rapid and complete recovery. Use crutches, rest, elevation, ice, and knee exercises as instructed. The length of recovery depends on various factors including type of injury, age, physical condition, medical conditions, and your rehabilitation. Your knee is the joint between the large bones (femur and tibia) in your leg. Cartilage covers these bone ends which are smooth and slippery and allow your knee to bend and move smoothly. Two menisci, thick, semi-lunar shaped pads of cartilage which form a rim inside the joint, help absorb shock and stabilize your knee. Ligaments bind the bones together and support your knee joint. Muscles move the joint, help support your knee, and take stress off the joint itself. Because of this all programs and physical therapy to rehabilitate an injured or repaired knee require rebuilding and strengthening your muscles. AFTER THE PROCEDURE  After the procedure, you will be moved to a recovery area until most of the effects of the medication have worn off. Your caregiver will discuss the test results with you.   Only take over-the-counter or prescription medicines for pain, discomfort, or fever as directed by your caregiver.  SEEK MEDICAL CARE IF:   You have increased bleeding from your wounds.   You see redness, swelling, or have increasing pain in your wounds.   You have pus coming from your wound.   You have an oral temperature above 102 F (38.9  C).   You notice a bad smell coming from the wound or dressing.   You have severe pain with any motion of your knee.  SEEK IMMEDIATE MEDICAL CARE IF:   You develop a rash.   You have difficulty breathing.   You have any allergic problems.  FURTHER INSTRUCTIONS:  You may start showering two days after being discharged home but do not submerge the incisions under water.   Change dressing 48 hours after the procedure and then cover the small incisions with band aids until your follow up visit.  Avoid periods of inactivity such as sitting longer than an hour when not asleep. This helps prevent blood clots.   You may put full weight on your legs and walk as much as is comfortable.   Do not drive while taking narcotics.  Wear the elastic stockings for three weeks following surgery during the day but you may remove then at night.  Make sure you keep all of your appointments after your operation with all of your doctors and caregivers. You should call the office at [phone] and make an appointment for approximately one week after the date of your surgery.  Please pick up a stool softener and laxative for home use as long as you are requiring pain medications.  Continue to use ice on the knee for pain and swelling from surgery. You may notice swelling that will progress down to the foot and ankle.  This is normal after surgery.  Elevate the leg when you are not up walking on it.   RANGE OF MOTION AND STRENGTHENING EXERCISES  Rehabilitation of the knee is  important following a knee injury or an operation. After just a few days of immobilization, the muscles of the thigh which control the knee become weakened and shrink (atrophy). Knee exercises are designed to build up the tone and strength of the thigh muscles and to improve knee motion. Often times heat used for twenty to thirty minutes before working out will loosen up your tissues and help with improving the range of motion but do not  use heat for the first two weeks following surgery. These exercises can be done on a training (exercise) mat, on the floor, on a table or on a bed. Use what ever works the best and is most comfortable for you Knee exercises include:       QUAD STRENGTHENING EXERCISES Strengthening Quadriceps Sets  Tighten muscles on top of thigh by pushing knees down into floor or table. Hold for 20 seconds. Repeat 10 times. Do 2 sessions per day.    Strengthening Terminal Knee Extension  With knee bent over bolster, straighten knee by tightening muscle on top of thigh. Be sure to keep bottom of knee on bolster. Hold for 20 seconds. Repeat 10 times. Do 2 sessions per day.   Straight Leg with Bent Knee  Lie on back with opposite leg bent. Keep involved knee slightly bent at knee and raise leg 4-6". Hold for 10 seconds. Repeat 20 times per set. Do 2 sets per session. Do 2 sessions per day.  You may remove your large bandage and shower in 2 days  Resume your 81 mg Aspirin tomorrow Post Anesthesia Home Care Instructions  Activity: Get plenty of rest for the remainder of the day. A responsible adult should stay with you for 24 hours following the procedure.  For the next 24 hours, DO NOT: -Drive a car -Paediatric nurse -Drink alcoholic beverages -Take any medication unless instructed by your physician -Make any legal decisions or sign important papers.  Meals: Start with liquid foods such as gelatin or soup. Progress to regular foods as tolerated. Avoid greasy, spicy, heavy foods. If nausea and/or vomiting occur, drink only clear liquids until the nausea and/or vomiting subsides. Call your physician if vomiting continues.  Special Instructions/Symptoms: Your throat may feel dry or sore from the anesthesia or the breathing tube placed in your throat during surgery. If this causes discomfort, gargle with warm salt water. The discomfort should disappear within 24 hours.

## 2012-03-01 NOTE — Anesthesia Postprocedure Evaluation (Signed)
Anesthesia Post Note  Patient: Derek [name]  Procedure(s) Performed: Procedure(s) (LRB): ARTHROSCOPY KNEE (Left)  Anesthesia type: General  Patient location: PACU  Post pain: Pain level controlled  Post assessment: Post-op Vital signs reviewed  Last Vitals: BP 115/59  Pulse 63  Temp 35.9 C (Oral)  Resp 14  Ht 6' 1.5" (1.867 m)  Wt 265 lb (120.203 kg)  BMI 34.49 kg/m2  SpO2 100%  Post vital signs: Reviewed  Level of consciousness: sedated  Complications: No apparent anesthesia complications

## 2012-03-04 ENCOUNTER — Encounter (HOSPITAL_BASED_OUTPATIENT_CLINIC_OR_DEPARTMENT_OTHER): Payer: Self-pay | Admitting: Orthopedic Surgery

## 2012-03-05 NOTE — Op Note (Signed)
NAMEMD., HOOS NO.:  [id number]  MEDICAL RECORD NO.:  [id number]  LOCATION:                                 FACILITY:  PHYSICIAN:  Gaynelle Arabian, M.D.    DATE OF BIRTH:  [birth year]  DATE OF PROCEDURE:  03/01/2012 DATE OF DISCHARGE:                              OPERATIVE REPORT   PREOPERATIVE DIAGNOSIS:  Hypertrophic synovitis, left knee.  POSTOPERATIVE DIAGNOSIS:  Hypertrophic synovitis, left knee.  PROCEDURE:  Left knee arthroscopy with synovectomy.  SURGEON:  Gaynelle Arabian, MD  ASSISTANT:  None.  ANESTHESIA:  General.  ESTIMATED BLOOD LOSS:  Minimal.  DRAINS:  None.  COMPLICATIONS:  None.  CONDITION:  Stable to recovery.  BRIEF CLINICAL NOTE:  Derek [name] is a 67 year old male, who had a total knee arthroplasty done left knee several years ago.  Has developed painful popping in the knee.  He had a bone scan, which ruled out any loosening of his components.  He presents now for arthroscopy and synovectomy.  PROCEDURE IN DETAIL:  After successful administration of general anesthetic, a tourniquet was placed on his left thigh and his left lower extremity was prepped and draped in the usual sterile fashion.  Standard superomedial and inferolateral incisions were made, inflow cannula passed, superomedial camera passed inferolateral.  Arthroscopic visualization proceeds.  There is a large amount of hypertrophic tissue at the junction of the quad tendon and the patellar component.  This obliterates the suprapatellar pouch.  A superolateral incision was made with the 11 blade and then using a combination of a shaver and the ArthroCare device, the tissue was debrided back to normal tissue to essentially clear out the suprapatellar region.  There was also some tissue in the medial and lateral gutters which was debrided.  Inferiorly it looks fine.  The components were all in good position and no signs of any loosening.  The joints again inspected.   No other abnormal tissue was noted.  The arthroscopic equipment was removed from the lateral portals, which were closed with interrupted 4-0 nylon.  A 20 mL of 0.25% Marcaine with epinephrine was injected through the inflow cannula and that was removed and that portal closed with nylon.  Incisions were cleaned and dried and a bulky sterile dressing applied.  He was then awakened and transported to recovery in stable condition.     Gaynelle Arabian, M.D.     FA/MEDQ  D:  03/01/2012  T:  03/01/2012  Job:  [id number]

## 2012-04-17 ENCOUNTER — Ambulatory Visit: Payer: Medicare Other | Attending: Specialist | Admitting: Rehabilitation

## 2012-04-17 DIAGNOSIS — M25579 Pain in unspecified ankle and joints of unspecified foot: Secondary | ICD-10-CM | POA: Insufficient documentation

## 2012-04-17 DIAGNOSIS — IMO0001 Reserved for inherently not codable concepts without codable children: Secondary | ICD-10-CM | POA: Insufficient documentation

## 2012-04-17 DIAGNOSIS — R269 Unspecified abnormalities of gait and mobility: Secondary | ICD-10-CM | POA: Insufficient documentation

## 2012-04-26 ENCOUNTER — Ambulatory Visit: Payer: Medicare Other | Admitting: Rehabilitation

## 2012-04-30 ENCOUNTER — Ambulatory Visit: Payer: Medicare Other | Attending: Specialist | Admitting: Rehabilitation

## 2012-04-30 DIAGNOSIS — R269 Unspecified abnormalities of gait and mobility: Secondary | ICD-10-CM | POA: Insufficient documentation

## 2012-04-30 DIAGNOSIS — IMO0001 Reserved for inherently not codable concepts without codable children: Secondary | ICD-10-CM | POA: Insufficient documentation

## 2012-04-30 DIAGNOSIS — M25579 Pain in unspecified ankle and joints of unspecified foot: Secondary | ICD-10-CM | POA: Insufficient documentation

## 2012-05-02 ENCOUNTER — Encounter: Payer: Medicare Other | Admitting: Rehabilitation

## 2012-05-07 ENCOUNTER — Emergency Department
Admission: EM | Admit: 2012-05-07 | Discharge: 2012-05-07 | Disposition: A | Discharge status: Home / Self Care | Payer: Medicare Other | Source: Home / Self Care | Attending: Family Medicine | Admitting: Family Medicine

## 2012-05-07 ENCOUNTER — Encounter: Payer: Self-pay | Admitting: *Deleted

## 2012-05-07 DIAGNOSIS — J329 Chronic sinusitis, unspecified: Secondary | ICD-10-CM

## 2012-05-07 HISTORY — DX: Gastro-esophageal reflux disease without esophagitis: K21.9

## 2012-05-07 MED ORDER — AMOXICILLIN-POT CLAVULANATE 875-125 MG PO TABS: 1.0000 | ORAL_TABLET | Freq: Two times a day (BID) | ORAL | Status: DC

## 2012-05-07 NOTE — ED Notes (Signed)
Pt c/o yellow/bloody nasal discharge, sinus pain and teeth pain x 4-5 days. Denies fever.

## 2012-05-07 NOTE — ED Provider Notes (Signed)
History     CSN: [id number]  Arrival date & time 05/07/12  Derek [name]   First MD Initiated Contact with Patient 05/07/12 1832      Chief Complaint  Patient presents with  . Nasal Congestion  . Facial Pain   HPI  SINUSITIS Onset:  5 days  Location: R maxilla  Description:R maxillary sinus pressure, nasal congestion   Modifying factors: Has hx/o recurrent sinusitis. Intermittent trace blood with nose blowing. Has not seen ENT > 1 year.   Symptoms Cough:  no Discharge:  Mild  Fever: no Sinus Pressure:  yes Ears Blocked:  no Teeth Ache:  yes Frontal Headache:  mild Second Sickening:  no  Red Flags Change in mental state: no Change in vision: no    Past Medical History  Diagnosis Date  . Hypertension   . Diabetes mellitus   . Kidney stone   . GERD (gastroesophageal reflux disease)     Past Surgical History  Procedure Laterality Date  . Knee surgery  2002    rt knee  . Shoulder surgery  '93bilateral '94 lt    x3  . Joint replacement  2009    knee-left  . Hernia repair  2000    umbilical  . Inguinal hernia repair  '74,'82  . Knee arthroscopy  03/01/2012    Procedure: ARTHROSCOPY KNEE;  Surgeon: Loanne Drilling, MD;  Location: [facility];  Service: Orthopedics;  Laterality: Left;  WITH SYNOVECTOMY   . Ankle surgery      History reviewed. No pertinent family history.  History  Substance Use Topics  . Smoking status: Former Smoker -- 4.00 packs/day for 17 years    Types: Cigarettes    Quit date: 02/21/1975  . Smokeless tobacco: Not on file  . Alcohol Use: No      Review of Systems  All other systems reviewed and are negative.    Allergies  Morphine and related; Oxycontin; and Quinine  Home Medications   Current Outpatient Rx  Name  Route  Sig  Dispense  Refill  . amoxicillin-clavulanate (AUGMENTIN) 875-125 MG per tablet   Oral   Take 1 tablet by mouth 2 (two) times daily.   18 tablet   0   . aspirin 81 MG tablet   Oral  Take 81 mg by mouth daily.         Marland Kitchen BENICAR 20 MG tablet               . benzonatate (TESSALON PERLES) 100 MG capsule      Take 2 po q6 hours prn   30 capsule   1   . carvedilol (COREG) 25 MG tablet   Oral   Take 25 mg by mouth 2 (two) times daily with a meal.          . clobetasol ointment (TEMOVATE) 0.05 %               . Cyanocobalamin (VITAMIN B 12 PO)   Oral   Take by mouth.         . Hydrocod Polst-Chlorphen Polst (TUSSICAPS) 10-8 MG CP12      Take 1 tablet Q 12 hours   10 each   0   . HYDROcodone-acetaminophen (NORCO) 5-325 MG per tablet   Oral   Take 1-2 tablets by mouth every 6 (six) hours as needed for pain.   50 tablet   1   . ipratropium (ATROVENT) 0.06 % nasal spray               .  Ketoprofen CR (KETOPROFEN CR) 200 MG CP24   Oral   Take 75 mg by mouth.          . L-Lysine 1000 MG TABS   Oral   Take by mouth.         . lansoprazole (PREVACID) 30 MG capsule   Oral   Take 30 mg by mouth 2 (two) times daily.          Marland Kitchen losartan (COZAAR) 50 MG tablet   Oral   Take 50 mg by mouth daily.         . metFORMIN (GLUCOPHAGE) 1000 MG tablet   Oral   Take 1,000 mg by mouth 2 (two) times daily with a meal. 1500 mg         . methocarbamol (ROBAXIN) 500 MG tablet   Oral   Take 1 tablet (500 mg total) by mouth 4 (four) times daily.   30 tablet   1   . POTASSIUM PO   Oral   Take by mouth.         . simvastatin (ZOCOR) 20 MG tablet               . traMADol (ULTRAM) 50 MG tablet   Oral   Take 1 tablet (50 mg total) by mouth every 6 (six) hours as needed. For SEVERE COUGH   30 tablet   0     BP 138/81  Pulse 73  Temp(Src) 97.8 F (36.6 C) (Oral)  Wt 267 lb (121.11 kg)  BMI 34.74 kg/m2  SpO2 97%  Physical Exam  Constitutional: He appears well-developed and well-nourished.  HENT:  Head: Normocephalic and atraumatic.  Right Ear: External ear normal.  Left Ear: External ear normal.  +nasal erythema,  rhinorrhea bilaterally, + post oropharyngeal erythema  + R sided maxillary TTP    Eyes: Conjunctivae are normal. Pupils are equal, round, and reactive to light.  Neck: Normal range of motion. Neck supple.  Cardiovascular: Normal rate, regular rhythm and normal heart sounds.   Pulmonary/Chest: Effort normal and breath sounds normal.  Abdominal: Soft.  Musculoskeletal: Normal range of motion.  Neurological: He is alert.  Skin: Skin is warm.    ED Course  Procedures (including critical care time)  Labs Reviewed - No data to display No results found.   1. Sinusitis       MDM  Will place on course of augmentin with plan to follow up with ENT in the next 1-2 days.  May need sinus imaging.  ? Allergic component. Will defer to ENT.  Discussed general care and ENT red flags.  Follow up as needed.     The patient and/or caregiver has been counseled thoroughly with regard to treatment plan and/or medications prescribed including dosage, schedule, interactions, rationale for use, and possible side effects and they verbalize understanding. Diagnoses and expected course of recovery discussed and will return if not improved as expected or if the condition worsens. Patient and/or caregiver verbalized understanding.             Doree Albee, MD 05/07/12 [phone]

## 2012-05-10 ENCOUNTER — Telehealth: Payer: Self-pay | Admitting: Emergency Medicine

## 2012-07-17 ENCOUNTER — Encounter: Payer: Self-pay | Admitting: *Deleted

## 2012-07-17 ENCOUNTER — Emergency Department
Admission: EM | Admit: 2012-07-17 | Discharge: 2012-07-17 | Disposition: A | Discharge status: Home / Self Care | Payer: Medicare Other | Source: Home / Self Care | Attending: Family Medicine | Admitting: Family Medicine

## 2012-07-17 DIAGNOSIS — R52 Pain, unspecified: Secondary | ICD-10-CM

## 2012-07-17 DIAGNOSIS — M545 Low back pain, unspecified: Secondary | ICD-10-CM

## 2012-07-17 DIAGNOSIS — G8929 Other chronic pain: Secondary | ICD-10-CM

## 2012-07-17 MED ORDER — HYDROCODONE-ACETAMINOPHEN 7.5-300 MG PO TABS: 1.0000 | ORAL_TABLET | Freq: Four times a day (QID) | ORAL | Status: DC | PRN

## 2012-07-17 MED ORDER — HYDROCODONE-ACETAMINOPHEN 5-325 MG PO TABS: 1.0000 | ORAL_TABLET | Freq: Four times a day (QID) | ORAL | Status: DC | PRN

## 2012-07-17 NOTE — ED Provider Notes (Signed)
History     CSN: [id number]  Arrival date & time 07/17/12  1914   First MD Initiated Contact with Patient 07/17/12 1928      Chief Complaint  Patient presents with  . Back Pain       HPI Comments: Patient has a long history of recurring low back pain.  He has a history of two herniated lumbar discs.  Past episodes of back pain have been improved with local injection.  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  The pain is primarily in his left lower back radiating to his left foot.  He is scheduled to see his PCP in two days who will schedule him for additional injection.  Patient is a 67 y.o. male presenting with back pain. The history is provided by the patient.  Back Pain Location:  Lumbar spine and gluteal region Quality:  Aching and stabbing Radiates to:  L foot Pain severity:  Severe Pain is:  Same all the time Onset quality:  Gradual Duration:  1 month Timing:  Constant Progression:  Worsening Chronicity:  Recurrent Context: lifting heavy objects   Relieved by:  Nothing Worsened by:  Movement and ambulation Associated symptoms: leg pain, paresthesias and tingling   Associated symptoms: no abdominal pain, no abdominal swelling, no bladder incontinence, no bowel incontinence, no chest pain, no dysuria, no fever, no headaches, no numbness, no perianal numbness, no weakness and no weight loss   Risk factors: lack of exercise and obesity     Past Medical History  Diagnosis Date  . Hypertension   . Diabetes mellitus   . Kidney stone   . GERD (gastroesophageal reflux disease)     Past Surgical History  Procedure Laterality Date  . Knee surgery  2002    rt knee  . Shoulder surgery  '93bilateral '94 lt    x3  . Joint replacement  2009    knee-left  . Hernia repair  2000    umbilical  . Inguinal hernia repair  '74,'82  . Knee arthroscopy  03/01/2012    Procedure: ARTHROSCOPY KNEE;   Surgeon: Loanne Drilling, MD;  Location: [facility];  Service: Orthopedics;  Laterality: Left;  WITH SYNOVECTOMY   . Ankle surgery      History reviewed. No pertinent family history.  History  Substance Use Topics  . Smoking status: Former Smoker -- 4.00 packs/day for 17 years    Types: Cigarettes    Quit date: 02/21/1975  . Smokeless tobacco: Not on file  . Alcohol Use: No      Review of Systems  Constitutional: Negative for fever and weight loss.  Cardiovascular: Negative for chest pain.  Gastrointestinal: Negative for abdominal pain and bowel incontinence.  Genitourinary: Negative for bladder incontinence and dysuria.  Musculoskeletal: Positive for back pain.  Neurological: Positive for tingling and paresthesias. Negative for weakness, numbness and headaches.  All other systems reviewed and are negative.    Allergies  Morphine and related; Oxycontin; and Quinine  Home Medications   Current Outpatient Rx  Name  Route  Sig  Dispense  Refill  . amoxicillin-clavulanate (AUGMENTIN) 875-125 MG per tablet   Oral   Take 1 tablet by mouth 2 (two) times daily.   20 tablet   0   . aspirin 81 MG tablet   Oral   Take 81 mg by mouth daily.         Marland Kitchen  BENICAR 20 MG tablet               . benzonatate (TESSALON PERLES) 100 MG capsule      Take 2 po q6 hours prn   30 capsule   1   . carvedilol (COREG) 25 MG tablet   Oral   Take 25 mg by mouth 2 (two) times daily with a meal.          . clobetasol ointment (TEMOVATE) 0.05 %               . Cyanocobalamin (VITAMIN B 12 PO)   Oral   Take by mouth.         . Hydrocod Polst-Chlorphen Polst (TUSSICAPS) 10-8 MG CP12      Take 1 tablet Q 12 hours   10 each   0   . HYDROcodone-acetaminophen (NORCO) 5-325 MG per tablet   Oral   Take 1-2 tablets by mouth every 6 (six) hours as needed for pain.   50 tablet   1   . Hydrocodone-Acetaminophen 7.5-300 MG TABS   Oral   Take 1 tablet by mouth  every 6 (six) hours as needed.   15 each   0   . ipratropium (ATROVENT) 0.06 % nasal spray               . Ketoprofen CR (KETOPROFEN CR) 200 MG CP24   Oral   Take 75 mg by mouth.          . L-Lysine 1000 MG TABS   Oral   Take by mouth.         . lansoprazole (PREVACID) 30 MG capsule   Oral   Take 30 mg by mouth 2 (two) times daily.          Marland Kitchen losartan (COZAAR) 50 MG tablet   Oral   Take 50 mg by mouth daily.         . metFORMIN (GLUCOPHAGE) 1000 MG tablet   Oral   Take 1,000 mg by mouth 2 (two) times daily with a meal. 1500 mg         . POTASSIUM PO   Oral   Take by mouth.         . simvastatin (ZOCOR) 20 MG tablet               . traMADol (ULTRAM) 50 MG tablet   Oral   Take 1 tablet (50 mg total) by mouth every 6 (six) hours as needed. For SEVERE COUGH   30 tablet   0     BP 122/77  Pulse 71  Temp(Src) 97.7 F (36.5 C) (Oral)  Resp 16  Ht 6\' 1"  (1.854 m)  Wt 266 lb (120.657 kg)  BMI 35.1 kg/m2  Physical Exam Nursing notes and Vital Signs reviewed. Appearance:  Patient appears stated age, and in no acute distress, but is uncomfortable with movement.  Patient is obese (BMI 35.1) Eyes:  Pupils are equal, round, and reactive to light and accomodation.  Extraocular movement is intact.  Conjunctivae are not inflamed   Pharynx:  Normal Neck:  Supple.   No adenopathy Lungs:  Clear to auscultation.  Breath sounds are equal.  Heart:  Regular rate and rhythm without murmurs, rubs, or gallops.  Abdomen:  Nontender without masses or hepatosplenomegaly.  Bowel sounds are present.  No CVA or flank tenderness.  Extremities:  No edema.  No calf tenderness Skin:  No rash present.   Back:  Decreased range of motion.  Tenderness in the midline and bilateral paraspinous muscles from L3 to Sacral area.  Straight leg raising test is negative.  Sitting knee extension test is negative.  Strength and sensation in the lower extremities is normal.  Patellar  reflexes are present  ED Course  Procedures  none      1. Acute exacerbation of chronic low back pain       MDM  Rx for Lortab. Apply ice pack for about 30 minutes every 2 to 4 hours for 1 to 2 days. Followup with PCP as scheduled in two days.        Lattie Haw, MD 07/18/12 [phone]

## 2012-07-17 NOTE — Discharge Instructions (Signed)
Apply ice pack for about 30 minutes every 2 to 4 hours for 1 to 2 days.

## 2012-07-17 NOTE — ED Notes (Signed)
Derek [name] reports 2 herniated lumbar disc for several years. He's had injections in the past that have helped the pain. 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 The pain is in his lower back and radiates down his left leg into his toe. Pain 10/10.  He is scheduled to see his PCP on Friday who will schedule him for additional injection. Today he reports he is here "for pain relief"

## 2012-08-12 ENCOUNTER — Other Ambulatory Visit: Payer: Self-pay | Admitting: Neurosurgery

## 2012-08-12 DIAGNOSIS — M545 Low back pain, unspecified: Secondary | ICD-10-CM

## 2012-08-20 ENCOUNTER — Ambulatory Visit
Admission: RE | Admit: 2012-08-20 | Discharge: 2012-08-20 | Disposition: A | Discharge status: Home / Self Care | Payer: Medicare Other | Source: Ambulatory Visit | Attending: Neurosurgery | Admitting: Neurosurgery

## 2012-08-20 DIAGNOSIS — M545 Low back pain, unspecified: Secondary | ICD-10-CM

## 2012-12-25 ENCOUNTER — Encounter: Payer: Self-pay | Admitting: Cardiology

## 2012-12-25 DIAGNOSIS — K219 Gastro-esophageal reflux disease without esophagitis: Secondary | ICD-10-CM | POA: Insufficient documentation

## 2012-12-25 DIAGNOSIS — I10 Essential (primary) hypertension: Secondary | ICD-10-CM | POA: Insufficient documentation

## 2012-12-25 DIAGNOSIS — G4733 Obstructive sleep apnea (adult) (pediatric): Secondary | ICD-10-CM | POA: Insufficient documentation

## 2012-12-25 DIAGNOSIS — N200 Calculus of kidney: Secondary | ICD-10-CM | POA: Insufficient documentation

## 2012-12-25 DIAGNOSIS — H8109 Meniere's disease, unspecified ear: Secondary | ICD-10-CM | POA: Insufficient documentation

## 2012-12-26 ENCOUNTER — Encounter: Payer: Self-pay | Admitting: Cardiology

## 2012-12-26 ENCOUNTER — Ambulatory Visit (INDEPENDENT_AMBULATORY_CARE_PROVIDER_SITE_OTHER): Payer: Medicare Other | Admitting: Cardiology

## 2012-12-26 VITALS — BP 133/88 | HR 74 | Ht 73.0 in | Wt 269.8 lb

## 2012-12-26 DIAGNOSIS — I428 Other cardiomyopathies: Secondary | ICD-10-CM

## 2012-12-26 DIAGNOSIS — G4733 Obstructive sleep apnea (adult) (pediatric): Secondary | ICD-10-CM

## 2012-12-26 DIAGNOSIS — E669 Obesity, unspecified: Secondary | ICD-10-CM

## 2012-12-26 DIAGNOSIS — R079 Chest pain, unspecified: Secondary | ICD-10-CM

## 2012-12-26 DIAGNOSIS — I420 Dilated cardiomyopathy: Secondary | ICD-10-CM

## 2012-12-26 DIAGNOSIS — I10 Essential (primary) hypertension: Secondary | ICD-10-CM

## 2012-12-26 DIAGNOSIS — E785 Hyperlipidemia, unspecified: Secondary | ICD-10-CM

## 2012-12-26 NOTE — Progress Notes (Signed)
[place] [address], [address], [place]  [address] Phone: [phone] Fax:  [phone]  Date:  12/26/2012   ID:  Derek [name], DOB 1945-05-05, MRN [id number]  PCP:   Melinda Crutch, MD   History of Present Illness: Derek [name] is a 67 y.o. male with diabetes, sleep apnea, obesity here for followup of chest pain in general, he has sharp pain in his chest intermittent moderate in intensity which seems to be occurring more often when relaxed and never with exertion. Duration is usually a min or 2. Out on bike and feel a "horrible" pain. Does not seem to be associated with food. He does feel fatigued all the time. His hemoglobin A1c was 8.8. LDL 81, HDL 35. Creatinine 0.8. TSH is normal at 2.3. Vitamin B12 is normal. Hematocrit is 44.9.   On 03/18/10 nuclear stress test: Mildly abnormal nuclear stress test with ejection fraction of 48%, mild reversibility along the inferior wall. Cannot exclude a mild degree of ischemia.  Overall, he is not complaining of any chest pain. He does occasionally have shortness of breath.  Wt Readings from Last 3 Encounters:  12/26/12 269 lb 12.8 oz (122.38 kg)  07/17/12 266 lb (120.657 kg)  05/07/12 267 lb (121.11 kg)     Past Medical History  Diagnosis Date  . Hypertension   . Diabetes mellitus   . Kidney stone   . GERD (gastroesophageal reflux disease)   . Meniere's disease   . Obstructive sleep apnea   . Morbid obesity   . Depression     Past Surgical History  Procedure Laterality Date  . Knee surgery  2002    rt knee  . Shoulder surgery  '93bilateral '94 lt    x3  . Joint replacement  2009    knee-left  . Hernia repair  [id number]    umbilical  . Inguinal hernia repair  '74,'82  . Knee arthroscopy  03/01/2012    Procedure: ARTHROSCOPY KNEE;  Surgeon: Gearlean Alf, MD;  Location: [facility];  Service: Orthopedics;  Laterality: Left;  WITH SYNOVECTOMY   . Ankle surgery      Current Outpatient Prescriptions    Medication Sig Dispense Refill  . carvedilol (COREG) 25 MG tablet Take 25 mg by mouth 2 (two) times daily with a meal.       . cyclobenzaprine (FLEXERIL) 10 MG tablet Take 10 mg by mouth 3 (three) times daily as needed for muscle spasms.      . Desoximetasone (TOPICORT) 0.25 % ointment As directed      . diphenhydrAMINE (BENADRYL) 25 mg capsule Take 25 mg by mouth every 6 (six) hours as needed.      . fluconazole (DIFLUCAN) 100 MG tablet Take 100 mg by mouth daily as needed.      Marland Kitchen glimepiride (AMARYL) 2 MG tablet 2 mg daily.      . Ketoprofen CR (KETOPROFEN CR) 200 MG CP24 Take 75 mg by mouth.       . lansoprazole (PREVACID) 30 MG capsule Take 30 mg by mouth 2 (two) times daily.       Marland Kitchen losartan (COZAAR) 50 MG tablet 50 mg daily.      . Magnesium Oxide (MAG-CAPS PO) Take 500 mg by mouth.      . metFORMIN (GLUCOPHAGE) 1000 MG tablet Take 500 mg by mouth as directed. Take 2 ablets in the morning and 3 tablets in hte evening.      Marland Kitchen  PROCTOSOL HC 2.5 % rectal cream As directed      . simvastatin (ZOCOR) 20 MG tablet Take 20 mg by mouth daily.        No current facility-administered medications for this visit.    Allergies:    Allergies  Allergen Reactions  . Morphine And Related Nausea And Vomiting  . Oxycontin [Oxycodone Hcl] Swelling    Lips swells  . Quinine     Platelets dropped    Social History:  The patient  reports that he quit smoking about 37 years ago. His smoking use included Cigarettes. He has a 68 pack-year smoking history. He does not have any smokeless tobacco history on file. He reports that he does not drink alcohol or use illicit drugs.   ROS:  Please see the history of present illness.  Thumb and knee (Aluisio) arthritis of from, knees.  No chest pain. He does have deconditioning, fatigue.  PHYSICAL EXAM: VS:  BP 133/88  Pulse 74  Ht 6' 1"$  (1.854 m)  Wt 269 lb 12.8 oz (122.38 kg)  BMI 35.60 kg/m2 Well nourished, well developed, in no acute distress HEENT:  normal Neck: no JVD Cardiac:  normal S1, S2; RRR; no murmur Lungs:  clear to auscultation bilaterally, no wheezing, rhonchi or rales Abd: soft, nontender, no hepatomegalyobese Ext: no edema Skin: warm and dry Neuro: no focal abnormalities noted  EKG:  Sinus rhythm, left anterior fascicular block, 74     ASSESSMENT AND PLAN:  1. Obesity-encourage weight loss. 2. Hypertension-currently well controlled. No changes made. 3. Diabetes-encouraged aspirin for prevention strategy. 4. Arthritis-may be having knee replacement, right. I do believe that he may proceed with surgery from a cardiovascular standpoint and will be of low to moderate risk. 5. Hyperlipidemia-continue with statin therapy. 6. Chest pain - no longer, resolved.  7. We will see back in one year  Signed, Candee Furbish, MD [facility]  12/26/2012 3:54 PM

## 2012-12-26 NOTE — Patient Instructions (Signed)
Your physician recommends that you continue on your current medications as directed. Please refer to the Current Medication list given to you today.  Your physician wants you to follow-up in: 1 year with Dr. Skians You will receive a reminder letter in the mail two months in advance. If you don't receive a letter, please call our office to schedule the follow-up appointment.  

## 2013-01-22 ENCOUNTER — Other Ambulatory Visit: Payer: Self-pay | Admitting: Cardiology

## 2013-02-03 ENCOUNTER — Encounter (INDEPENDENT_AMBULATORY_CARE_PROVIDER_SITE_OTHER): Payer: Self-pay | Admitting: Surgery

## 2013-02-04 ENCOUNTER — Encounter (INDEPENDENT_AMBULATORY_CARE_PROVIDER_SITE_OTHER): Payer: Self-pay | Admitting: Surgery

## 2013-02-04 ENCOUNTER — Ambulatory Visit (INDEPENDENT_AMBULATORY_CARE_PROVIDER_SITE_OTHER): Payer: Medicare Other | Admitting: Surgery

## 2013-02-04 VITALS — BP 140/96 | HR 76 | Temp 98.9°F | Resp 14 | Ht 72.0 in | Wt 268.6 lb

## 2013-02-04 DIAGNOSIS — K602 Anal fissure, unspecified: Secondary | ICD-10-CM | POA: Insufficient documentation

## 2013-02-04 MED ORDER — LIDOCAINE 5 % EX OINT: 1.0000 "application " | TOPICAL_OINTMENT | CUTANEOUS | Status: DC | PRN

## 2013-02-04 MED ORDER — NITROGLYCERIN 0.4 % RE OINT: 1.0000 [in_us] | TOPICAL_OINTMENT | Freq: Two times a day (BID) | RECTAL | Status: DC

## 2013-02-04 NOTE — Progress Notes (Signed)
Subjective:     Patient ID: Derek [name], male   DOB: [birth year], 68 y.o.   MRN: [id number]  HPI This is a gentleman seen in the past for umbilical hernia. He now presents with a painful anal fissure. He was having anal bleeding so had a colonoscopy performed approximately 3 months ago. Since then he has had significant perianal burning. He has pain with bowel movements. He has had multiple medications including steroids and diltiazem ointment as well as stool softeners and sitz baths without relief. He denies constipation, blood per rectum, or continence issues. He is otherwise without complaints.  Review of Systems     Objective:   Physical Exam On exam, he is afebrile his vital signs stable Lungs clear to auscultation bilaterally Cardiovascular is regular rate and rhythm Rectal exam shows a posterior anal fissure which is visible    Assessment:     Anal fissure     Plan:     As this has persisted for 3 months, I believe it is time to proceed with surgery. I discussed this with him and his wife in detail. I discussed fissurectomy and sphincterotomy. I discussed the risks which includes but is not limited to bleeding, infection, having a chronic open wound, incontinence, recurrence, et Karie Soda. He understands and wishes to proceed. Surgery will be scheduled

## 2013-02-10 ENCOUNTER — Encounter (HOSPITAL_BASED_OUTPATIENT_CLINIC_OR_DEPARTMENT_OTHER): Payer: Self-pay | Admitting: *Deleted

## 2013-02-10 NOTE — Progress Notes (Signed)
02/10/13 1657  OBSTRUCTIVE SLEEP APNEA  Have you ever been diagnosed with sleep apnea through a sleep study? Yes (states he does not now)  If yes, do you have and use a CPAP or BPAP machine every night? 0  Do you snore loudly (loud enough to be heard through closed doors)?  1  Do you have, or are you being treated for high blood pressure? 1  BMI more than 35 kg/m2? 1  Age over 67 years old? 1  Gender: 1  Obstructive Sleep Apnea Score 5  Score 4 or greater  Results sent to PCP

## 2013-02-10 NOTE — Progress Notes (Signed)
Pt saw dr Anne Fu 11/14 and cleared him for future rt total knee-pt has had many surgeries-had never had a clot-states he used to have sleep apnea-does not now but has not any corrective surgery-to come in for bmet-to bring all meds dos-says he has had panik attacks but does bnot take any meds

## 2013-02-12 ENCOUNTER — Encounter (INDEPENDENT_AMBULATORY_CARE_PROVIDER_SITE_OTHER): Payer: Self-pay

## 2013-02-18 NOTE — H&P (Signed)
  Subjective:   Patient ID: Derek [name], male DOB: 09/28/1945, 67 y.o. MRN: [id number]  HPI  This is a gentleman seen in the past for umbilical hernia. He now presents with a painful anal fissure. He was having anal bleeding so had a colonoscopy performed approximately 3 months ago. Since then he has had significant perianal burning. He has pain with bowel movements. He has had multiple medications including steroids and diltiazem ointment as well as stool softeners and sitz baths without relief. He denies constipation, blood per rectum, or continence issues. He is otherwise without complaints.  Past medical history and past surgical history are unchanged  Medications: Please refer to the list in Epic   Review of Systems : The rest of his review of systems is negative   Objective:   Physical Exam :  On exam, he is afebrile his vital signs stable  Generally he is comfortable in appearance  Lungs clear to auscultation bilaterally, No respiratory distress  Cardiovascular is regular rate and rhythm  Abdomen soft and nontender  Musculoskeletal normal  Rectal exam shows a posterior anal fissure which is visible   Assessment:   Anal fissure   Plan:   As this has persisted for 3 months, I believe it is time to proceed with surgery. I discussed this with him and his wife in detail. I discussed fissurectomy and sphincterotomy. I discussed the risks which includes but is not limited to bleeding, infection, having a chronic open wound, incontinence, recurrence, et cetera. He understands and wishes to proceed. Surgery will be scheduled   

## 2013-02-19 ENCOUNTER — Ambulatory Visit (HOSPITAL_BASED_OUTPATIENT_CLINIC_OR_DEPARTMENT_OTHER): Admission: RE | Admit: 2013-02-19 | Payer: Medicare Other | Source: Ambulatory Visit | Admitting: Surgery

## 2013-02-19 HISTORY — DX: Anxiety disorder, unspecified: F41.9

## 2013-02-19 SURGERY — EXAM UNDER ANESTHESIA WITH ANAL FISTULECTOMY
Anesthesia: General

## 2013-03-10 ENCOUNTER — Telehealth (INDEPENDENT_AMBULATORY_CARE_PROVIDER_SITE_OTHER): Payer: Self-pay | Admitting: General Surgery

## 2013-03-10 NOTE — Telephone Encounter (Signed)
LMOM for patient to call back and ask for Krystalyn Kubota/need to ask if the patient had his surgery on 02-19-13 with Dr Ninfa Linden

## 2013-03-17 ENCOUNTER — Encounter (INDEPENDENT_AMBULATORY_CARE_PROVIDER_SITE_OTHER): Payer: Medicare Other | Admitting: Surgery

## 2013-03-23 ENCOUNTER — Other Ambulatory Visit: Payer: Self-pay | Admitting: *Deleted

## 2013-03-23 DIAGNOSIS — Z79899 Other long term (current) drug therapy: Secondary | ICD-10-CM

## 2013-03-25 ENCOUNTER — Encounter (HOSPITAL_BASED_OUTPATIENT_CLINIC_OR_DEPARTMENT_OTHER): Payer: Self-pay | Admitting: *Deleted

## 2013-03-25 NOTE — Progress Notes (Signed)
R/s to come in for bmet

## 2013-03-26 NOTE — Progress Notes (Signed)
Wife called-they may cancel-seeing dr 03/31/13-if cannot get here in time for bmet-will need istat am surgery

## 2013-03-28 ENCOUNTER — Encounter (HOSPITAL_BASED_OUTPATIENT_CLINIC_OR_DEPARTMENT_OTHER)
Admission: RE | Admit: 2013-03-28 | Discharge: 2013-03-28 | Disposition: A | Discharge status: Home / Self Care | Payer: Medicare FFS | Source: Ambulatory Visit | Attending: Surgery | Admitting: Surgery

## 2013-03-28 DIAGNOSIS — Z01812 Encounter for preprocedural laboratory examination: Secondary | ICD-10-CM | POA: Insufficient documentation

## 2013-03-28 DIAGNOSIS — Z01818 Encounter for other preprocedural examination: Secondary | ICD-10-CM | POA: Insufficient documentation

## 2013-03-28 LAB — BASIC METABOLIC PANEL
BUN: 20 mg/dL (ref 6–23)
CO2: 23 mEq/L (ref 19–32)
Calcium: 10.4 mg/dL (ref 8.4–10.5)
Chloride: 101 mEq/L (ref 96–112)
Creatinine, Ser: 1.14 mg/dL (ref 0.50–1.35)
GFR calc Af Amer: 74 mL/min — ABNORMAL LOW (ref >90)
GFR calc non Af Amer: 64 mL/min — ABNORMAL LOW (ref >90)
Glucose, Bld: 295 mg/dL — ABNORMAL HIGH (ref 70–99)
Potassium: 5.5 mEq/L — ABNORMAL HIGH (ref 3.7–5.3)
Sodium: 138 mEq/L (ref 137–147)

## 2013-03-31 ENCOUNTER — Encounter (INDEPENDENT_AMBULATORY_CARE_PROVIDER_SITE_OTHER): Payer: Medicare HMO | Admitting: Surgery

## 2013-03-31 NOTE — H&P (Signed)
  Subjective:   Patient ID: Derek [name], male DOB: 05/18/1945, 67 y.o. MRN: [id number]  HPI  This is a gentleman seen in the past for umbilical hernia. He now presents with a painful anal fissure. He was having anal bleeding so had a colonoscopy performed approximately 3 months ago. Since then he has had significant perianal burning. He has pain with bowel movements. He has had multiple medications including steroids and diltiazem ointment as well as stool softeners and sitz baths without relief. He denies constipation, blood per rectum, or continence issues. He is otherwise without complaints.  Past medical history and past surgical history are unchanged  Medications: Please refer to the list in Epic   Review of Systems : The rest of his review of systems is negative   Objective:   Physical Exam :  On exam, he is afebrile his vital signs stable  Generally he is comfortable in appearance  Lungs clear to auscultation bilaterally, No respiratory distress  Cardiovascular is regular rate and rhythm  Abdomen soft and nontender  Musculoskeletal normal  Rectal exam shows a posterior anal fissure which is visible   Assessment:   Anal fissure   Plan:   As this has persisted for 3 months, I believe it is time to proceed with surgery. I discussed this with him and his wife in detail. I discussed fissurectomy and sphincterotomy. I discussed the risks which includes but is not limited to bleeding, infection, having a chronic open wound, incontinence, recurrence, et Ronney Asters. He understands and wishes to proceed. Surgery will be scheduled

## 2013-04-01 ENCOUNTER — Encounter (HOSPITAL_BASED_OUTPATIENT_CLINIC_OR_DEPARTMENT_OTHER): Payer: Medicare FFS | Admitting: Anesthesiology

## 2013-04-01 ENCOUNTER — Encounter (HOSPITAL_BASED_OUTPATIENT_CLINIC_OR_DEPARTMENT_OTHER): Payer: Self-pay | Admitting: *Deleted

## 2013-04-01 ENCOUNTER — Ambulatory Visit (HOSPITAL_BASED_OUTPATIENT_CLINIC_OR_DEPARTMENT_OTHER): Payer: Medicare FFS | Admitting: Anesthesiology

## 2013-04-01 ENCOUNTER — Ambulatory Visit (HOSPITAL_BASED_OUTPATIENT_CLINIC_OR_DEPARTMENT_OTHER)
Admission: RE | Admit: 2013-04-01 | Discharge: 2013-04-01 | Disposition: A | Discharge status: Home / Self Care | Payer: Medicare FFS | Source: Ambulatory Visit | Attending: Surgery | Admitting: Surgery

## 2013-04-01 ENCOUNTER — Encounter (HOSPITAL_BASED_OUTPATIENT_CLINIC_OR_DEPARTMENT_OTHER)
Admission: RE | Disposition: A | Discharge status: Home / Self Care | Payer: Self-pay | Source: Ambulatory Visit | Attending: Surgery

## 2013-04-01 DIAGNOSIS — Z87891 Personal history of nicotine dependence: Secondary | ICD-10-CM | POA: Insufficient documentation

## 2013-04-01 DIAGNOSIS — K602 Anal fissure, unspecified: Secondary | ICD-10-CM

## 2013-04-01 DIAGNOSIS — K648 Other hemorrhoids: Secondary | ICD-10-CM | POA: Insufficient documentation

## 2013-04-01 DIAGNOSIS — E119 Type 2 diabetes mellitus without complications: Secondary | ICD-10-CM | POA: Insufficient documentation

## 2013-04-01 DIAGNOSIS — I10 Essential (primary) hypertension: Secondary | ICD-10-CM | POA: Insufficient documentation

## 2013-04-01 DIAGNOSIS — K219 Gastro-esophageal reflux disease without esophagitis: Secondary | ICD-10-CM | POA: Insufficient documentation

## 2013-04-01 HISTORY — PX: ANAL FISTULECTOMY: SHX1139

## 2013-04-01 LAB — GLUCOSE, CAPILLARY
Glucose-Capillary: 140 mg/dL — ABNORMAL HIGH (ref 70–99)
Glucose-Capillary: 154 mg/dL — ABNORMAL HIGH (ref 70–99)

## 2013-04-01 LAB — POCT HEMOGLOBIN-HEMACUE: Hemoglobin: 12.1 g/dL — ABNORMAL LOW (ref 13.0–17.0)

## 2013-04-01 SURGERY — FISTULECTOMY, ANAL
Anesthesia: General | Site: Rectum

## 2013-04-01 MED ORDER — CEFAZOLIN SODIUM-DEXTROSE 2-3 GM-% IV SOLR: 2.0000 g | INTRAVENOUS | Status: DC

## 2013-04-01 MED ORDER — ACETAMINOPHEN 650 MG RE SUPP
650.0000 mg | RECTAL | Status: DC | PRN
Start: 2013-04-01 — End: 2013-04-01

## 2013-04-01 MED ORDER — DEXAMETHASONE SODIUM PHOSPHATE 4 MG/ML IJ SOLN
INTRAMUSCULAR | Status: DC | PRN
  Administered 2013-04-01: 10 mg via INTRAVENOUS

## 2013-04-01 MED ORDER — FENTANYL CITRATE 0.05 MG/ML IJ SOLN: 50.0000 ug | INTRAMUSCULAR | Status: DC | PRN

## 2013-04-01 MED ORDER — OXYCODONE HCL 5 MG PO TABS: 5.0000 mg | ORAL_TABLET | Freq: Once | ORAL | Status: DC | PRN

## 2013-04-01 MED ORDER — CEFAZOLIN SODIUM 1-5 GM-% IV SOLN
INTRAVENOUS | Status: AC
  Filled 2013-04-01: qty 50

## 2013-04-01 MED ORDER — ONDANSETRON HCL 4 MG/2ML IJ SOLN
INTRAMUSCULAR | Status: DC | PRN
  Administered 2013-04-01: 4 mg via INTRAVENOUS

## 2013-04-01 MED ORDER — HYDROCODONE-ACETAMINOPHEN 5-325 MG PO TABS: 1.0000 | ORAL_TABLET | ORAL | Status: DC | PRN

## 2013-04-01 MED ORDER — PROPOFOL 10 MG/ML IV BOLUS
INTRAVENOUS | Status: DC | PRN
  Administered 2013-04-01: 150 mg via INTRAVENOUS

## 2013-04-01 MED ORDER — ONDANSETRON HCL 4 MG/2ML IJ SOLN: 4.0000 mg | Freq: Four times a day (QID) | INTRAMUSCULAR | Status: DC | PRN

## 2013-04-01 MED ORDER — MIDAZOLAM HCL 2 MG/2ML IJ SOLN
INTRAMUSCULAR | Status: AC
  Filled 2013-04-01: qty 2

## 2013-04-01 MED ORDER — MIDAZOLAM HCL 2 MG/2ML IJ SOLN: 1.0000 mg | INTRAMUSCULAR | Status: DC | PRN

## 2013-04-01 MED ORDER — ACETAMINOPHEN 325 MG PO TABS: 650.0000 mg | ORAL_TABLET | ORAL | Status: DC | PRN

## 2013-04-01 MED ORDER — BUPIVACAINE LIPOSOME 1.3 % IJ SUSP
INTRAMUSCULAR | Status: DC | PRN
  Administered 2013-04-01: 20 mL

## 2013-04-01 MED ORDER — LIDOCAINE HCL (CARDIAC) 20 MG/ML IV SOLN
INTRAVENOUS | Status: DC | PRN
  Administered 2013-04-01: 80 mg via INTRAVENOUS

## 2013-04-01 MED ORDER — SODIUM CHLORIDE 0.9 % IJ SOLN: 3.0000 mL | Freq: Two times a day (BID) | INTRAMUSCULAR | Status: DC

## 2013-04-01 MED ORDER — FENTANYL CITRATE 0.05 MG/ML IJ SOLN
INTRAMUSCULAR | Status: AC
  Filled 2013-04-01: qty 4

## 2013-04-01 MED ORDER — ONDANSETRON HCL 4 MG/2ML IJ SOLN: 4.0000 mg | Freq: Once | INTRAMUSCULAR | Status: DC | PRN

## 2013-04-01 MED ORDER — LIDOCAINE 5 % EX OINT: 1.0000 "application " | TOPICAL_OINTMENT | Freq: Four times a day (QID) | CUTANEOUS | Status: DC | PRN

## 2013-04-01 MED ORDER — DEXTROSE 5 % IV SOLN
3.0000 g | Freq: Once | INTRAVENOUS | Status: AC
  Administered 2013-04-01: 3 g via INTRAVENOUS

## 2013-04-01 MED ORDER — LACTATED RINGERS IV SOLN
INTRAVENOUS | Status: DC
  Administered 2013-04-01: 07:00:00 via INTRAVENOUS

## 2013-04-01 MED ORDER — EPHEDRINE SULFATE 50 MG/ML IJ SOLN
INTRAMUSCULAR | Status: DC | PRN
  Administered 2013-04-01 (×2): 5 mg via INTRAVENOUS

## 2013-04-01 MED ORDER — HYDROMORPHONE HCL PF 1 MG/ML IJ SOLN: 0.2500 mg | INTRAMUSCULAR | Status: DC | PRN

## 2013-04-01 MED ORDER — FENTANYL CITRATE 0.05 MG/ML IJ SOLN: 25.0000 ug | INTRAMUSCULAR | Status: DC | PRN

## 2013-04-01 MED ORDER — OXYCODONE HCL 5 MG/5ML PO SOLN: 5.0000 mg | Freq: Once | ORAL | Status: DC | PRN

## 2013-04-01 MED ORDER — SODIUM CHLORIDE 0.9 % IV SOLN: 250.0000 mL | INTRAVENOUS | Status: DC | PRN

## 2013-04-01 MED ORDER — CEFAZOLIN SODIUM-DEXTROSE 2-3 GM-% IV SOLR
INTRAVENOUS | Status: AC
  Filled 2013-04-01: qty 50

## 2013-04-01 MED ORDER — BUPIVACAINE LIPOSOME 1.3 % IJ SUSP
INTRAMUSCULAR | Status: AC
  Filled 2013-04-01: qty 20

## 2013-04-01 MED ORDER — SODIUM CHLORIDE 0.9 % IJ SOLN: 3.0000 mL | INTRAMUSCULAR | Status: DC | PRN

## 2013-04-01 MED ORDER — MIDAZOLAM HCL 5 MG/5ML IJ SOLN
INTRAMUSCULAR | Status: DC | PRN
  Administered 2013-04-01: 2 mg via INTRAVENOUS

## 2013-04-01 MED ORDER — FENTANYL CITRATE 0.05 MG/ML IJ SOLN
INTRAMUSCULAR | Status: DC | PRN
  Administered 2013-04-01: 100 ug via INTRAVENOUS

## 2013-04-01 MED ORDER — BUPIVACAINE-EPINEPHRINE PF 0.5-1:200000 % IJ SOLN
INTRAMUSCULAR | Status: AC
  Filled 2013-04-01: qty 30

## 2013-04-01 SURGICAL SUPPLY — 40 items
BLADE SURG 15 STRL LF DISP TIS (BLADE) ×1 IMPLANT
BLADE SURG 15 STRL SS (BLADE) ×2
BRIEF STRETCH FOR OB PAD LRG (UNDERPADS AND DIAPERS) ×2 IMPLANT
CANISTER SUCT 1200ML W/VALVE (MISCELLANEOUS) ×2 IMPLANT
COVER MAYO STAND STRL (DRAPES) IMPLANT
DECANTER SPIKE VIAL GLASS SM (MISCELLANEOUS) ×1 IMPLANT
DRAPE UTILITY XL STRL (DRAPES) ×1 IMPLANT
ELECT REM PT RETURN 9FT ADLT (ELECTROSURGICAL) ×2
ELECTRODE REM PT RTRN 9FT ADLT (ELECTROSURGICAL) ×1 IMPLANT
GLOVE BIOGEL PI IND STRL 7.5 (GLOVE) IMPLANT
GLOVE BIOGEL PI INDICATOR 7.5 (GLOVE) ×1
GLOVE SURG SIGNA 7.5 PF LTX (GLOVE) ×2 IMPLANT
GLOVE SURG SS PI 8.0 STRL IVOR (GLOVE) ×1 IMPLANT
GOWN STRL REUS W/ TWL LRG LVL3 (GOWN DISPOSABLE) ×1 IMPLANT
GOWN STRL REUS W/ TWL XL LVL3 (GOWN DISPOSABLE) ×1 IMPLANT
GOWN STRL REUS W/TWL LRG LVL3 (GOWN DISPOSABLE) ×2
GOWN STRL REUS W/TWL XL LVL3 (GOWN DISPOSABLE) ×2
NDL HYPO 25X1 1.5 SAFETY (NEEDLE) ×1 IMPLANT
NEEDLE HYPO 25X1 1.5 SAFETY (NEEDLE) ×2 IMPLANT
PACK BASIN DAY SURGERY FS (CUSTOM PROCEDURE TRAY) ×2 IMPLANT
PACK LITHOTOMY IV (CUSTOM PROCEDURE TRAY) ×2 IMPLANT
PAD ABD 8X10 STRL (GAUZE/BANDAGES/DRESSINGS) ×1 IMPLANT
PENCIL BUTTON HOLSTER BLD 10FT (ELECTRODE) ×2 IMPLANT
SHEARS HARMONIC 9CM CVD (BLADE) ×1 IMPLANT
SPONGE GAUZE 4X4 12PLY STER LF (GAUZE/BANDAGES/DRESSINGS) ×3 IMPLANT
SPONGE SURGIFOAM ABS GEL 100 (HEMOSTASIS) ×2 IMPLANT
SPONGE SURGIFOAM ABS GEL 12-7 (HEMOSTASIS) IMPLANT
SURGILUBE 2OZ TUBE FLIPTOP (MISCELLANEOUS) ×2 IMPLANT
SUT CHROMIC 2 0 SH (SUTURE) ×1 IMPLANT
SUT CHROMIC 3 0 SH 27 (SUTURE) IMPLANT
SUT VIC AB 3-0 SH 27 (SUTURE) ×2
SUT VIC AB 3-0 SH 27X BRD (SUTURE) IMPLANT
SYR CONTROL 10ML LL (SYRINGE) ×2 IMPLANT
TOWEL OR 17X24 6PK STRL BLUE (TOWEL DISPOSABLE) ×3 IMPLANT
TOWEL OR NON WOVEN STRL DISP B (DISPOSABLE) ×2 IMPLANT
TRAY DSU PREP LF (CUSTOM PROCEDURE TRAY) ×2 IMPLANT
TRAY PROCTOSCOPIC FIBER OPTIC (SET/KITS/TRAYS/PACK) IMPLANT
TUBE CONNECTING 20X1/4 (TUBING) ×2 IMPLANT
UNDERPAD 30X30 INCONTINENT (UNDERPADS AND DIAPERS) ×2 IMPLANT
YANKAUER SUCT BULB TIP NO VENT (SUCTIONS) ×2 IMPLANT

## 2013-04-01 NOTE — Anesthesia Procedure Notes (Signed)
Procedure Name: LMA Insertion Date/Time: 04/01/2013 7:30 AM Performed by: Toula Moos Pre-anesthesia Checklist: Patient identified, Emergency Drugs available, Suction available, Patient being monitored and Timeout performed Patient Re-evaluated:Patient Re-evaluated prior to inductionOxygen Delivery Method: Circle system utilized Preoxygenation: Pre-oxygenation with 100% oxygen Intubation Type: IV induction Ventilation: Mask ventilation without difficulty LMA: LMA inserted LMA Size: 5.0 Number of attempts: 1 Placement Confirmation: breath sounds checked- equal and bilateral and positive ETCO2 Tube secured with: Tape Dental Injury: Teeth and Oropharynx as per pre-operative assessment

## 2013-04-01 NOTE — Interval H&P Note (Signed)
History and Physical Interval Note: no change in H and P  04/01/2013 7:05 AM  Derek [name]  has presented today for surgery, with the diagnosis of anal fissure   The various methods of treatment have been discussed with the patient and family. After consideration of risks, benefits and other options for treatment, the patient has consented to  Procedure(s): EXAM UNDER ANESTHESIA WITH ANAL FISSURectomy sphincterotomy  (N/A) as a surgical intervention .  The patient's history has been reviewed, patient examined, no change in status, stable for surgery.  I have reviewed the patient's chart and labs.  Questions were answered to the patient's satisfaction.     [name] A

## 2013-04-01 NOTE — Anesthesia Preprocedure Evaluation (Signed)
Anesthesia Evaluation  Patient identified by MRN, date of birth, ID band Patient awake    Reviewed: Allergy & Precautions, H&P , NPO status , Patient's Chart, lab work & pertinent test results  History of Anesthesia Complications (+) PONV  Airway Mallampati: I TM Distance: >3 FB Neck ROM: Full    Dental  (+) Partial Lower and Teeth Intact   Pulmonary Sleep apnea: Pt states there is no problem and has not ever used or needed CPAP. , former smoker,  breath sounds clear to auscultation        Cardiovascular hypertension, Pt. on medications and Pt. on home beta blockers Rhythm:Regular Rate:Normal     Neuro/Psych    GI/Hepatic GERD-  Medicated and Controlled,  Endo/Other  diabetes, Well Controlled, Type 2, Oral Hypoglycemic AgentsMorbid obesity  Renal/GU      Musculoskeletal   Abdominal   Peds  Hematology   Anesthesia Other Findings   Reproductive/Obstetrics                           Anesthesia Physical Anesthesia Plan  ASA: III  Anesthesia Plan: General   Post-op Pain Management:    Induction: Intravenous  Airway Management Planned:   Additional Equipment:   Intra-op Plan:   Post-operative Plan: Extubation in OR  Informed Consent: I have reviewed the patients History and Physical, chart, labs and discussed the procedure including the risks, benefits and alternatives for the proposed anesthesia with the patient or authorized representative who has indicated his/her understanding and acceptance.   Dental advisory given  Plan Discussed with: CRNA, Anesthesiologist and Surgeon  Anesthesia Plan Comments:         Anesthesia Quick Evaluation

## 2013-04-01 NOTE — Anesthesia Postprocedure Evaluation (Signed)
  Anesthesia Post-op Note  Patient: Derek [name]  Procedure(s) Performed: Procedure(s): EXAM UNDER ANESTHESIA WITH ANAL FISSURectomy, sphincterotomy and internal hemorrhoidectomy (N/A)  Patient Location: PACU  Anesthesia Type:General  Level of Consciousness: awake, alert  and oriented  Airway and Oxygen Therapy: Patient Spontanous Breathing  Post-op Pain: mild  Post-op Assessment: Post-op Vital signs reviewed  Post-op Vital Signs: Reviewed  Complications: No apparent anesthesia complications

## 2013-04-01 NOTE — Transfer of Care (Signed)
Immediate Anesthesia Transfer of Care Note  Patient: Derek [name]  Procedure(s) Performed: Procedure(s): EXAM UNDER ANESTHESIA WITH ANAL FISSURectomy sphincterotomy  (N/A)  Patient Location: PACU  Anesthesia Type:General  Level of Consciousness: sedated  Airway & Oxygen Therapy: Patient Spontanous Breathing and Patient connected to face mask oxygen  Post-op Assessment: Report given to PACU RN and Post -op Vital signs reviewed and stable  Post vital signs: stable  Complications: No apparent anesthesia complications

## 2013-04-01 NOTE — Discharge Instructions (Signed)
[facility] _______[facility] ° °RECTAL SURGERY POST OP INSTRUCTIONS: POST OP INSTRUCTIONS ° °Always review your discharge instruction sheet given to you by the facility where your surgery was performed. °IF YOU HAVE DISABILITY OR FAMILY LEAVE FORMS, YOU MUST BRING THEM TO THE OFFICE FOR PROCESSING.   °DO NOT GIVE THEM TO YOUR DOCTOR. ° °1. A  prescription for pain medication may be given to you upon discharge.  Take your pain medication as prescribed, if needed.  If narcotic pain medicine is not needed, then you may take acetaminophen (Tylenol) or ibuprofen (Advil) as needed. °2. Take your usually prescribed medications unless otherwise directed. °3. If you need a refill on your pain medication, please contact your pharmacy.  They will contact our office to request authorization. Prescriptions will not be filled after 5 pm or on week-ends. °4. You should follow a light diet the first 48 hours after arrival home, such as soup and crackers, etc.  Be sure to include lots of fluids daily.  Resume your normal diet 2-3 days after surgery.. °5. Most patients will experience some swelling and discomfort in the rectal area. Ice packs, reclining and warm tub soaks will help.  Swelling and discomfort can take several days to resolve.  °6. It is common to experience some constipation if taking pain medication after surgery.  Increasing fluid intake and taking a stool softener (such as Colace) will usually help or prevent this problem from occurring.  A mild laxative (Milk of Magnesia or Miralax) should be taken according to package directions if there are no bowel movements after 48 hours. °7. Unless discharge instructions indicate otherwise, leave your bandage dry and in place for 24 hours, or remove the bandage if you have a bowel movement. You may notice a small amount of bleeding with bowel movements for the first few days. You may have some packing in the rectum which will come out over the first day or two. You  will need to wear an absorbent pad or soft cotton gauze in your underwear until the drainage stops.it. °8. ACTIVITIES:  You may resume regular (light) daily activities beginning the next day--such as daily self-care, walking, climbing stairs--gradually increasing activities as tolerated.  You may have sexual intercourse when it is comfortable.  Refrain from any heavy lifting or straining until approved by your doctor. °a. You may drive when you are no longer taking prescription pain medication, you can comfortably wear a seatbelt, and you can safely maneuver your car and apply brakes. °b. RETURN TO WORK: : ____________________ °c.  °9. You should see your doctor in the office for a follow-up appointment approximately 2-3 weeks after your surgery.  Make sure that you call for this appointment within a day or two after you arrive home to insure a convenient appointment time. °10. OTHER INSTRUCTIONS:  __________________________________________________________________________________________________________________________________________________________________________________________  °WHEN TO CALL YOUR DOCTOR: °1. Fever over 101.0 °2. Inability to urinate °3. Nausea and/or vomiting °4. Extreme swelling or bruising °5. Continued bleeding from rectum. °6. Increased pain, redness, or drainage from the incision °7. Constipation ° °The clinic staff is available to answer your questions during regular business hours.  Please don’t hesitate to call and ask to speak to one of the nurses for clinical concerns.  If you have a medical emergency, go to the nearest emergency room or call 911.  A surgeon from [facility] is always on call at the hospital ° ° °[address], [address], [place], [place]  [address] ? °   [id number], [place], [place]   [address] [phone] ? [phone] ? FAX [phone] Web site: [email or web address]   Post Anesthesia Home Care Instructions  Activity: Get  plenty of rest for the remainder of the day. A responsible adult should stay with you for 24 hours following the procedure.  For the next 24 hours, DO NOT: -Drive a car -Paediatric nurse -Drink alcoholic beverages -Take any medication unless instructed by your physician -Make any legal decisions or sign important papers.  Meals: Start with liquid foods such as gelatin or soup. Progress to regular foods as tolerated. Avoid greasy, spicy, heavy foods. If nausea and/or vomiting occur, drink only clear liquids until the nausea and/or vomiting subsides. Call your physician if vomiting continues.  Special Instructions/Symptoms: Your throat may feel dry or sore from the anesthesia or the breathing tube placed in your throat during surgery. If this causes discomfort, gargle with warm salt water. The discomfort should disappear within 24 hours.

## 2013-04-01 NOTE — Op Note (Signed)
EXAM UNDER ANESTHESIA WITH ANAL FISSURectomy sphincterotomy   Procedure Note  DEMETRUS PAVAO Sr 04/01/2013   Pre-op Diagnosis: anal fissure      Post-op Diagnosis: anal fissure, internal hemorrhoids with bleeding  Procedure(s): EXAM UNDER ANESTHESIA  LATERAL INTERNAL ANAL SPHINCTEROTOMY 2 COLUMN INTERNAL HEMORRHOIDECTOMY FISSURECTOMY WITH V-Y ADVANCEMENT FLAP CLOSURE   Surgeon(s): Harl Bowie, MD  Anesthesia: General  Staff:  Circulator: Billey Co, RN Scrub Person: Romero Liner, CST  Estimated Blood Loss: Minimal               Specimens: sent to path          [facility]   Date: 04/01/2013  Time: 7:59 AM

## 2013-04-02 ENCOUNTER — Telehealth (INDEPENDENT_AMBULATORY_CARE_PROVIDER_SITE_OTHER): Payer: Self-pay | Admitting: General Surgery

## 2013-04-02 NOTE — Op Note (Signed)
NAMEJACORY, Derek [name]            ACCOUNT NO.:  [id number]  MEDICAL RECORD NO.:  [id number]  LOCATION:                                 FACILITY:  PHYSICIAN:  Coralie Keens, M.D. DATE OF BIRTH:  October 17, 1945  DATE OF PROCEDURE:  04/01/2013 DATE OF DISCHARGE:  04/01/2013                              OPERATIVE REPORT   PREOPERATIVE DIAGNOSIS:  Anal fissure.  POSTOPERATIVE DIAGNOSES:  Anal fissure and bleeding internal hemorrhoids.  PROCEDURE: 1. Exam under anesthesia. 2. Lateral internal anal sphincterotomy. 3. Two-column internal hemorrhoidectomy. 4. Fissurectomy with V-Y advancement flap closure.  SURGEON:  Coralie Keens, M.D.  ANESTHESIA:  General and injectable Exparel.  ESTIMATED BLOOD LOSS:  Minimal.  INDICATIONS:  This is a 68 year old gentleman who has had a severe posterior anal fissure, which has failed conservative management.  He still has a fair amount of pain and has had bleeding from internal hemorrhoids.  The decision had been made to proceed with exam under anesthesia.  FINDINGS:  The patient was found to have a very tight sphincter mechanism.  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  He also had 2 large internal hemorrhoidal columns with bleeding.  PROCEDURE IN DETAIL:  The patient was brought to the operating room and identified as Derek [name].  He was placed supine on the operating room table and general anesthesia was induced.  The patient was then placed in lithotomy position.  His perianal area was then prepped and draped in usual sterile fashion.  I injected the perianal area with injectable Exparel.  I then inserted a retractor in the anal canal.  His large posterior anal fissure at the midline was easily evident with granulation tissue.  He had a very tight sphincter mechanism.  He also had 2 large internal hemorrhoidal columns, which were easily bleeding. At this point, I made a lateral incision with a scalpel next  to the anus.  I went down to the sphincter muscles, I grasped this with Allis clamps.  I then performed a partial anal sphincterotomy with the electrocautery.  Next, I addressed the 2 large internal hemorrhoidal columns.  I grasped each internal hemorrhoidal column with an Allis clamp and completely excised the hemorrhoids with the Harmonic Scalpel. Hemostasis appeared to be achieved.  Next, I excised the posterior anal fissure and all the granulation tissue with the electrocautery.  I then closed the deficit with interrupted 3-0 Vicryl sutures using a V-Y advancement flap to close the defect.  I then closed the sphincterotomy site with an interrupted 2-0 chromic suture.  I then injected more Exparel circumferentially around the anus.  I placed a piece of Gelfoam in the anal canal.  Hemostasis appeared to be achieved.  The patient tolerated the procedure well.  All the counts were correct at the end of procedure.  The patient was then extubated in the operating room and taken in a stable condition to the recovery room.     Coralie Keens, M.D.     DB/MEDQ  D:  04/01/2013  T:  04/02/2013  Job:  [id number]

## 2013-04-02 NOTE — Telephone Encounter (Signed)
Pt's wife called to discuss constipation.  He is passing gas and taking stool softener now.  Suggested he start Miralax QD to BID and try to walk more.  Once he starts to move his bowels, Miralax only QD to maintain his regularity.  She understands and will comply.

## 2013-04-03 ENCOUNTER — Telehealth (INDEPENDENT_AMBULATORY_CARE_PROVIDER_SITE_OTHER): Payer: Self-pay

## 2013-04-03 ENCOUNTER — Encounter (INDEPENDENT_AMBULATORY_CARE_PROVIDER_SITE_OTHER): Payer: Self-pay | Admitting: General Surgery

## 2013-04-03 ENCOUNTER — Emergency Department (HOSPITAL_BASED_OUTPATIENT_CLINIC_OR_DEPARTMENT_OTHER)
Admission: EM | Admit: 2013-04-03 | Discharge: 2013-04-03 | Disposition: A | Discharge status: Home / Self Care | Payer: Medicare FFS | Attending: Emergency Medicine | Admitting: Emergency Medicine

## 2013-04-03 ENCOUNTER — Encounter (HOSPITAL_BASED_OUTPATIENT_CLINIC_OR_DEPARTMENT_OTHER): Payer: Self-pay | Admitting: Surgery

## 2013-04-03 ENCOUNTER — Encounter (INDEPENDENT_AMBULATORY_CARE_PROVIDER_SITE_OTHER): Payer: Medicare HMO | Admitting: Surgery

## 2013-04-03 DIAGNOSIS — K625 Hemorrhage of anus and rectum: Secondary | ICD-10-CM | POA: Insufficient documentation

## 2013-04-03 DIAGNOSIS — I10 Essential (primary) hypertension: Secondary | ICD-10-CM | POA: Insufficient documentation

## 2013-04-03 DIAGNOSIS — Y838 Other surgical procedures as the cause of abnormal reaction of the patient, or of later complication, without mention of misadventure at the time of the procedure: Secondary | ICD-10-CM | POA: Insufficient documentation

## 2013-04-03 DIAGNOSIS — Z8659 Personal history of other mental and behavioral disorders: Secondary | ICD-10-CM | POA: Insufficient documentation

## 2013-04-03 DIAGNOSIS — Z87442 Personal history of urinary calculi: Secondary | ICD-10-CM | POA: Insufficient documentation

## 2013-04-03 DIAGNOSIS — E119 Type 2 diabetes mellitus without complications: Secondary | ICD-10-CM | POA: Insufficient documentation

## 2013-04-03 DIAGNOSIS — R339 Retention of urine, unspecified: Secondary | ICD-10-CM

## 2013-04-03 DIAGNOSIS — IMO0002 Reserved for concepts with insufficient information to code with codable children: Secondary | ICD-10-CM | POA: Insufficient documentation

## 2013-04-03 DIAGNOSIS — M129 Arthropathy, unspecified: Secondary | ICD-10-CM | POA: Insufficient documentation

## 2013-04-03 DIAGNOSIS — Z87891 Personal history of nicotine dependence: Secondary | ICD-10-CM | POA: Insufficient documentation

## 2013-04-03 DIAGNOSIS — K6289 Other specified diseases of anus and rectum: Secondary | ICD-10-CM | POA: Insufficient documentation

## 2013-04-03 DIAGNOSIS — Z8669 Personal history of other diseases of the nervous system and sense organs: Secondary | ICD-10-CM | POA: Insufficient documentation

## 2013-04-03 DIAGNOSIS — Z862 Personal history of diseases of the blood and blood-forming organs and certain disorders involving the immune mechanism: Secondary | ICD-10-CM | POA: Insufficient documentation

## 2013-04-03 DIAGNOSIS — K219 Gastro-esophageal reflux disease without esophagitis: Secondary | ICD-10-CM | POA: Insufficient documentation

## 2013-04-03 DIAGNOSIS — R300 Dysuria: Secondary | ICD-10-CM | POA: Insufficient documentation

## 2013-04-03 DIAGNOSIS — Z79899 Other long term (current) drug therapy: Secondary | ICD-10-CM | POA: Insufficient documentation

## 2013-04-03 DIAGNOSIS — Z7982 Long term (current) use of aspirin: Secondary | ICD-10-CM | POA: Insufficient documentation

## 2013-04-03 LAB — URINALYSIS, ROUTINE W REFLEX MICROSCOPIC
Bilirubin Urine: NEGATIVE
Glucose, UA: >1000 mg/dL — AB
Hgb urine dipstick: NEGATIVE
Ketones, ur: NEGATIVE mg/dL
Leukocytes, UA: NEGATIVE
Nitrite: NEGATIVE
Protein, ur: NEGATIVE mg/dL
Specific Gravity, Urine: 1.031 — ABNORMAL HIGH (ref 1.005–1.030)
Urobilinogen, UA: 0.2 mg/dL (ref 0.0–1.0)
pH: 6 (ref 5.0–8.0)

## 2013-04-03 LAB — CBC WITH DIFFERENTIAL/PLATELET
Basophils Absolute: 0 10*3/uL (ref 0.0–0.1)
Basophils Relative: 1 % (ref 0–1)
Eosinophils Absolute: 0.3 10*3/uL (ref 0.0–0.7)
Eosinophils Relative: 4 % (ref 0–5)
HCT: 36.8 % — ABNORMAL LOW (ref 39.0–52.0)
Hemoglobin: 12.4 g/dL — ABNORMAL LOW (ref 13.0–17.0)
Lymphocytes Relative: 16 % (ref 12–46)
Lymphs Abs: 1.1 10*3/uL (ref 0.7–4.0)
MCH: 27.8 pg (ref 26.0–34.0)
MCHC: 33.7 g/dL (ref 30.0–36.0)
MCV: 82.5 fL (ref 78.0–100.0)
Monocytes Absolute: 0.9 10*3/uL (ref 0.1–1.0)
Monocytes Relative: 13 % — ABNORMAL HIGH (ref 3–12)
Neutro Abs: 4.6 10*3/uL (ref 1.7–7.7)
Neutrophils Relative %: 67 % (ref 43–77)
Platelets: 175 10*3/uL (ref 150–400)
RBC: 4.46 MIL/uL (ref 4.22–5.81)
RDW: 13 % (ref 11.5–15.5)
WBC: 6.9 10*3/uL (ref 4.0–10.5)

## 2013-04-03 LAB — COMPREHENSIVE METABOLIC PANEL
ALT: 19 U/L (ref 0–53)
AST: 16 U/L (ref 0–37)
Albumin: 4 g/dL (ref 3.5–5.2)
Alkaline Phosphatase: 75 U/L (ref 39–117)
BUN: 17 mg/dL (ref 6–23)
CO2: 22 mEq/L (ref 19–32)
Calcium: 10.5 mg/dL (ref 8.4–10.5)
Chloride: 100 mEq/L (ref 96–112)
Creatinine, Ser: 1 mg/dL (ref 0.50–1.35)
GFR calc Af Amer: 87 mL/min — ABNORMAL LOW (ref >90)
GFR calc non Af Amer: 75 mL/min — ABNORMAL LOW (ref >90)
Glucose, Bld: 305 mg/dL — ABNORMAL HIGH (ref 70–99)
Potassium: 5 mEq/L (ref 3.7–5.3)
Sodium: 136 mEq/L — ABNORMAL LOW (ref 137–147)
Total Bilirubin: 0.4 mg/dL (ref 0.3–1.2)
Total Protein: 7 g/dL (ref 6.0–8.3)

## 2013-04-03 LAB — URINE MICROSCOPIC-ADD ON

## 2013-04-03 LAB — PROTIME-INR
INR: 1.04 (ref 0.00–1.49)
Prothrombin Time: 13.4 seconds (ref 11.6–15.2)

## 2013-04-03 NOTE — Discharge Instructions (Signed)
Avoid lifting or straining as this can trigger another episode of bleeding. Keep the foley catheter in until you can get seen by Dr Trevor Mace office on Monday. In the interim return here (or ideally) [place] emergency room if you develop a return of your bleeding as discussed or any other symptoms including increased rectal pain, weakness or dizziness,  Nausea or vomiting.

## 2013-04-03 NOTE — Telephone Encounter (Signed)
Patient wife called to change appointment time to 4:00pm, she states patient still having problems and they will be running late to office appointment.  Appointment time changed to accommodate patient's needs.

## 2013-04-03 NOTE — ED Notes (Signed)
Bright red rectal bleeding. States 2 days ago he had a hemorrhoidectomy and rectal surgery. He has not been able to urinate. Strained today and the bleeding started. States he is wearing a pad and it is saturated. States he cant estimate blood loss but it is a lot per patient.

## 2013-04-03 NOTE — Progress Notes (Unsigned)
Patient ID: Derek [name], male   DOB: [birth year], 68 y.o.   MRN: [id number] I received a phone call from Evalee Jefferson, physician's assistant at [facility].  He has an anorectal surgery by Dr. Ninfa Linden earlier this week. He awoke today and had difficulty voiding. He strained and noted having some bleeding from the rectum. He presented there for evaluation. He had been there for 2-1/2 hours and it had no further bleeding. They put a Foley in. I told her since they removed the stimulus that is straining to void, likely he will not bleed again. I told her to have them removed the Foley Monday morning at midnight. I will have the office call and check on him tomorrow. If he has recurrent bleeding tonight, I told her to have him come to the [facility] emergency room.

## 2013-04-03 NOTE — ED Notes (Signed)
Walked Pt in the department. Pt tolerated it well. Changed the foley drainage bag to a leg bag

## 2013-04-03 NOTE — ED Provider Notes (Signed)
CSN: [id number]     Arrival date & time 04/03/13  1805 History   First MD Initiated Contact with Patient 04/03/13 1818     No chief complaint on file.    (Consider location/radiation/quality/duration/timing/severity/associated sxs/prior Treatment) HPI Comments: Derek [name] is a 68 y.o. Male presenting with rectal bleeding and urinary retention. He is day 2 post op for an anal fissurectomy, sphincterotomy and internal hemorrhoidectomy by Dr. Ninfa Linden.  Patient states he has had a moderate amount of rectal pain post surgery which has been fairly well-controlled with Percocet.  Additionally some days he has had a small amount of rectal bleeding which he told was normal, changing a pad twice daily.  This afternoon around 2 PM he strained in order to try and empty his bladder and was unsuccessful, but developed much brisker rectal bleeding and pain.  He states having to change 2 pads since 2 PM.  He was supposed to followup in urgent care clinic at the general surgeon's office at 4 PM this afternoon but was unable to make this appointment.  He denies fevers, weakness or dizziness, headache or other complaint except for strong urgent need to urinate.  Past medical history is also significant for hypertension, diabetes, and Lieden Factor V deficiency.     The history is provided by the patient.    Past Medical History  Diagnosis Date  . Hypertension   . Diabetes mellitus   . Kidney stone   . GERD (gastroesophageal reflux disease)   . Meniere's disease   . Morbid obesity   . Depression   . Arthritis   . 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 Obstructive sleep apnea     pt states not now-did used to use one  . Anxiety   . PONV (postoperative nausea and vomiting)    Past Surgical History  Procedure Laterality Date  . Knee surgery  2002    rt knee  . Shoulder surgery  '93bilateral '94 lt    x3  . Hernia repair  6256    umbilical  . Inguinal hernia  repair  '74,'82  . Knee arthroscopy  03/01/2012    Procedure: ARTHROSCOPY KNEE;  Surgeon: Gearlean Alf, MD;  Location: [facility];  Service: Orthopedics;  Laterality: Left;  WITH SYNOVECTOMY   . Ankle surgery  2010    left-fx  . Hardware removal  2013    ankle-lt  . Joint replacement  2009    knee-left  . Anal fistulectomy N/A 04/01/2013    Procedure: EXAM UNDER ANESTHESIA WITH ANAL FISSURectomy, sphincterotomy and internal hemorrhoidectomy;  Surgeon: Harl Bowie, MD;  Location: [place];  Service: General;  Laterality: N/A;   No family history on file. History  Substance Use Topics  . Smoking status: Former Smoker -- 4.00 packs/day for 17 years    Types: Cigarettes    Quit date: 02/21/1975  . Smokeless tobacco: Not on file  . Alcohol Use: No    Review of Systems  Constitutional: Negative for fever.  HENT: Negative for congestion and sore throat.   Eyes: Negative.   Respiratory: Negative for chest tightness and shortness of breath.   Cardiovascular: Negative for chest pain.  Gastrointestinal: Positive for anal bleeding and rectal pain. Negative for nausea and abdominal pain.  Genitourinary: Positive for difficulty urinating.  Musculoskeletal: Negative for arthralgias, joint swelling and neck pain.  Skin: Negative.  Negative for rash and  wound.  Neurological: Negative for dizziness, weakness, light-headedness, numbness and headaches.  Psychiatric/Behavioral: Negative.       Allergies  Morphine and related; Oxycontin; and Quinine  Home Medications   Current Outpatient Rx  Name  Route  Sig  Dispense  Refill  . aspirin 81 MG tablet   Oral   Take 81 mg by mouth daily.         . Blood Glucose Monitoring Suppl (ONE TOUCH ULTRA SYSTEM KIT) W/DEVICE KIT   Does not apply   1 kit by Does not apply route once.         . carvedilol (COREG) 25 MG tablet   Oral   Take 25 mg by mouth 2 (two) times daily with a meal.          .  cyclobenzaprine (FLEXERIL) 10 MG tablet   Oral   Take 10 mg by mouth 3 (three) times daily as needed for muscle spasms.         . Desoximetasone (TOPICORT) 0.25 % ointment      As directed         . fluconazole (DIFLUCAN) 100 MG tablet   Oral   Take 100 mg by mouth daily as needed.         Marland Kitchen glimepiride (AMARYL) 2 MG tablet      2 mg daily.         Marland Kitchen HYDROcodone-acetaminophen (NORCO) 5-325 MG per tablet   Oral   Take 1-2 tablets by mouth every 4 (four) hours as needed for moderate pain.   50 tablet   0   . Ketoprofen CR (KETOPROFEN CR) 200 MG CP24   Oral   Take 75 mg by mouth.          . lansoprazole (PREVACID) 30 MG capsule   Oral   Take 30 mg by mouth 2 (two) times daily.          Marland Kitchen lidocaine (XYLOCAINE) 5 % ointment   Topical   Apply 1 application topically 4 (four) times daily as needed.   35.44 g   0   . losartan (COZAAR) 50 MG tablet      TAKE 1 TABLET BY MOUTH ONCE DAILY   30 tablet   12   . Magnesium Oxide (MAG-CAPS PO)   Oral   Take 500 mg by mouth.         . metFORMIN (GLUCOPHAGE) 1000 MG tablet   Oral   Take 500 mg by mouth as directed. Take 2 ablets in the morning and 3 tablets in hte evening.         . Nitroglycerin 0.4 % OINT   Rectal   Place 1 inch rectally 2 (two) times daily.   30 g   1   . simvastatin (ZOCOR) 20 MG tablet   Oral   Take 20 mg by mouth daily.           BP 140/85  Pulse 88  Temp(Src) 98 F (36.7 C) (Oral)  Resp 20  Ht '6\' 1"'  (1.854 m)  Wt 265 lb (120.203 kg)  BMI 34.97 kg/m2  SpO2 99% Physical Exam  Nursing note and vitals reviewed. Constitutional: He appears well-developed and well-nourished.  HENT:  Head: Normocephalic and atraumatic.  Slightly pale conjunctiva.  Eyes: Conjunctivae are normal.  Neck: Normal range of motion.  Cardiovascular: Normal rate, regular rhythm, normal heart sounds and intact distal pulses.   Pulmonary/Chest: Effort normal and breath sounds normal. He has no  wheezes.  Abdominal: Soft. Bowel sounds are normal. There is no hepatosplenomegaly. There is no tenderness. There is no rebound and no guarding.  Genitourinary:  Visible blood surrounding the anus.  There is deep ecchymosis  in the perirectal area, small nonbleeding nodule external to the anus suspicious for possible hemorrhoids.  No bleeding at this time.    Musculoskeletal: Normal range of motion.  Neurological: He is alert.  Skin: Skin is warm and dry.  Psychiatric: He has a normal mood and affect.    ED Course  Procedures (including critical care time) Labs Review Labs Reviewed  CBC WITH DIFFERENTIAL - Abnormal; Notable for the following:    Hemoglobin 12.4 (*)    HCT 36.8 (*)    Monocytes Relative 13 (*)    All other components within normal limits  COMPREHENSIVE METABOLIC PANEL - Abnormal; Notable for the following:    Sodium 136 (*)    Glucose, Bld 305 (*)    GFR calc non Af Amer 75 (*)    GFR calc Af Amer 87 (*)    All other components within normal limits  URINALYSIS, ROUTINE W REFLEX MICROSCOPIC - Abnormal; Notable for the following:    Specific Gravity, Urine 1.031 (*)    Glucose, UA >1000 (*)    All other components within normal limits  PROTIME-INR  URINE MICROSCOPIC-ADD ON   Imaging Review No results found.  EKG Interpretation   None       MDM   Final diagnoses:  Post-op bleeding  Urinary retention    Patients labs and/or radiological studies were viewed and considered during the medical decision making and disposition process. Labs stable.   Discussed with Dr. Barkley Bruns with general surgery. Pt has had no return of rectal bleeding since arrival here 2.5 hours ago. Dr. Barkley Bruns suggests to observe for 1 more hour, ambulate and if no return of bleeding,  No further tx necessary. Advised to keep foley in,  Should be removed in 3 days, suggesting Sunday night at midnight,  Then can f/u with Dr Ninfa Linden if unable to urinate when wakes Monday am.  Of  course,  Recheck emergently for any return of bleeding.  Also advised no straining, no lifting. These instructions given to patient who was stable during the remainder of his ed visit.   Discussed with Dr. Wyvonnia Dusky who agrees with plan, but advised see his surgeon on Monday for recheck and for foley removal. Pt and wife agreeable with plan and with dc home.   Evalee Jefferson, PA-C 04/03/13 2345

## 2013-04-04 ENCOUNTER — Telehealth (INDEPENDENT_AMBULATORY_CARE_PROVIDER_SITE_OTHER): Payer: Self-pay

## 2013-04-04 NOTE — Telephone Encounter (Signed)
Patient wife called regarding Foley Catheter removal appointment for Monday.  Please advise if our office is to removal Foley Monday.  S/p Urination Retention, seen in ED last night.

## 2013-04-04 NOTE — Telephone Encounter (Signed)
Called and spoke to patient's wife regarding foley catheter removal.  Wife states they did not receive any instructions on how to remove foley at home.  Patient given appointment for Monday 04/07/13 @ 9:30 am with nurse visit.  Wife verbalized understanding.

## 2013-04-04 NOTE — ED Provider Notes (Signed)
Medical screening examination/treatment/procedure(s) were conducted as a shared visit with non-physician practitioner(s) and myself.  I personally evaluated the patient during the encounter.  Rectal bleeding, postop day 2 after anal surgery. Straining to urinate and had increase in rectal bleeding. No abdominal pain, back pain, fever. Urinary retention without weakness, numbness, tingling, or back pain.  CN 2-12 intact, no ataxia on finger to nose, no nystagmus, 5/5 strength throughout, no pronator drift, Romberg negative, normal gait.   EKG Interpretation   None         Derek Essex, MD 04/04/13 [phone]

## 2013-04-05 ENCOUNTER — Emergency Department (HOSPITAL_COMMUNITY): Payer: Medicare FFS

## 2013-04-05 ENCOUNTER — Encounter (HOSPITAL_COMMUNITY): Payer: Self-pay | Admitting: Emergency Medicine

## 2013-04-05 ENCOUNTER — Emergency Department (HOSPITAL_COMMUNITY)
Admission: EM | Admit: 2013-04-05 | Discharge: 2013-04-05 | Disposition: A | Discharge status: Home / Self Care | Payer: Medicare FFS | Attending: Emergency Medicine | Admitting: Emergency Medicine

## 2013-04-05 DIAGNOSIS — G8918 Other acute postprocedural pain: Secondary | ICD-10-CM | POA: Insufficient documentation

## 2013-04-05 DIAGNOSIS — K6289 Other specified diseases of anus and rectum: Secondary | ICD-10-CM | POA: Insufficient documentation

## 2013-04-05 DIAGNOSIS — Z7982 Long term (current) use of aspirin: Secondary | ICD-10-CM | POA: Insufficient documentation

## 2013-04-05 DIAGNOSIS — Z862 Personal history of diseases of the blood and blood-forming organs and certain disorders involving the immune mechanism: Secondary | ICD-10-CM | POA: Insufficient documentation

## 2013-04-05 DIAGNOSIS — Z87442 Personal history of urinary calculi: Secondary | ICD-10-CM | POA: Insufficient documentation

## 2013-04-05 DIAGNOSIS — K219 Gastro-esophageal reflux disease without esophagitis: Secondary | ICD-10-CM | POA: Insufficient documentation

## 2013-04-05 DIAGNOSIS — Z8659 Personal history of other mental and behavioral disorders: Secondary | ICD-10-CM | POA: Insufficient documentation

## 2013-04-05 DIAGNOSIS — K5900 Constipation, unspecified: Secondary | ICD-10-CM | POA: Insufficient documentation

## 2013-04-05 DIAGNOSIS — M129 Arthropathy, unspecified: Secondary | ICD-10-CM | POA: Insufficient documentation

## 2013-04-05 DIAGNOSIS — K625 Hemorrhage of anus and rectum: Secondary | ICD-10-CM | POA: Insufficient documentation

## 2013-04-05 DIAGNOSIS — E119 Type 2 diabetes mellitus without complications: Secondary | ICD-10-CM | POA: Insufficient documentation

## 2013-04-05 DIAGNOSIS — Z87891 Personal history of nicotine dependence: Secondary | ICD-10-CM | POA: Insufficient documentation

## 2013-04-05 DIAGNOSIS — Z79899 Other long term (current) drug therapy: Secondary | ICD-10-CM | POA: Insufficient documentation

## 2013-04-05 DIAGNOSIS — Z791 Long term (current) use of non-steroidal anti-inflammatories (NSAID): Secondary | ICD-10-CM | POA: Insufficient documentation

## 2013-04-05 DIAGNOSIS — I10 Essential (primary) hypertension: Secondary | ICD-10-CM | POA: Insufficient documentation

## 2013-04-05 MED ORDER — ONDANSETRON 4 MG PO TBDP: 4.0000 mg | ORAL_TABLET | Freq: Three times a day (TID) | ORAL | Status: DC | PRN

## 2013-04-05 MED ORDER — ONDANSETRON 4 MG PO TBDP
4.0000 mg | ORAL_TABLET | Freq: Once | ORAL | Status: AC
  Administered 2013-04-05: 4 mg via ORAL
  Filled 2013-04-05: qty 1

## 2013-04-05 MED ORDER — LIDOCAINE HCL 2 % EX GEL
Freq: Once | CUTANEOUS | Status: AC
  Administered 2013-04-05: 10 via TOPICAL
  Filled 2013-04-05: qty 10

## 2013-04-05 MED ORDER — ONDANSETRON HCL 4 MG/2ML IJ SOLN
4.0000 mg | Freq: Once | INTRAMUSCULAR | Status: AC
  Administered 2013-04-05: 4 mg via INTRAVENOUS
  Filled 2013-04-05: qty 2

## 2013-04-05 MED ORDER — HYDROMORPHONE HCL PF 1 MG/ML IJ SOLN
1.0000 mg | INTRAMUSCULAR | Status: DC | PRN
  Administered 2013-04-05: 1 mg via INTRAVENOUS
  Filled 2013-04-05: qty 1

## 2013-04-05 NOTE — ED Provider Notes (Signed)
CSN: [id number]     Arrival date & time 04/05/13  1133 History   First MD Initiated Contact with Patient 04/05/13 1146     Chief Complaint  Patient presents with  . Rectal Pain      HPI  Patient presents for a status post hemorrhoidectomy and anal fissure, resection and rectal flap placement.  Surgery was 40s ago.  Surgeon was Dr. Ninfa Linden.  Seen 2 days ago, with some rectal bleeding, urinary retention.  Foley catheter was placed.  No additional difficulties.  No additional bleeding.  Today, he used a baby wipe and immediately felt  "severe burning  complaining of 10 out of 10", rectal pain.   Bleeding.  Periods last about 2 days ago.  Continues of clear urine through his Foley catheter and leg bag.  Past Medical History  Diagnosis Date  . Hypertension   . Diabetes mellitus   . Kidney stone   . GERD (gastroesophageal reflux disease)   . Meniere's disease   . Morbid obesity   . Depression   . Arthritis   . 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 Obstructive sleep apnea     pt states not now-did used to use one  . Anxiety   . PONV (postoperative nausea and vomiting)    Past Surgical History  Procedure Laterality Date  . Knee surgery  2002    rt knee  . Shoulder surgery  '93bilateral '94 lt    x3  . Hernia repair  4098    umbilical  . Inguinal hernia repair  '74,'82  . Knee arthroscopy  03/01/2012    Procedure: ARTHROSCOPY KNEE;  Surgeon: Gearlean Alf, MD;  Location: [facility];  Service: Orthopedics;  Laterality: Left;  WITH SYNOVECTOMY   . Ankle surgery  2010    left-fx  . Hardware removal  2013    ankle-lt  . Joint replacement  2009    knee-left  . Anal fistulectomy N/A 04/01/2013    Procedure: EXAM UNDER ANESTHESIA WITH ANAL FISSURectomy, sphincterotomy and internal hemorrhoidectomy;  Surgeon: Harl Bowie, MD;  Location: [facility];  Service: General;  Laterality: N/A;   History reviewed. No  pertinent family history. History  Substance Use Topics  . Smoking status: Former Smoker -- 4.00 packs/day for 17 years    Types: Cigarettes    Quit date: 02/21/1975  . Smokeless tobacco: Not on file  . Alcohol Use: No    Review of Systems  Constitutional: Negative for fever, chills, diaphoresis, appetite change and fatigue.  HENT: Negative for mouth sores, sore throat and trouble swallowing.   Eyes: Negative for visual disturbance.  Respiratory: Negative for cough, chest tightness, shortness of breath and wheezing.   Cardiovascular: Negative for chest pain.  Gastrointestinal: Positive for constipation, anal bleeding and rectal pain. Negative for nausea, vomiting, abdominal pain, diarrhea and abdominal distention.  Endocrine: Negative for polydipsia, polyphagia and polyuria.  Genitourinary: Negative for dysuria, frequency and hematuria.  Musculoskeletal: Negative for gait problem.  Skin: Negative for color change, pallor and rash.  Neurological: Negative for dizziness, syncope, light-headedness and headaches.  Hematological: Does not bruise/bleed easily.  Psychiatric/Behavioral: Negative for behavioral problems and confusion.      Allergies  Morphine and related; Oxycontin; and Quinine  Home Medications   Current Outpatient Rx  Name  Route  Sig  Dispense  Refill  . aspirin EC 81 MG tablet   Oral  Take 81 mg by mouth every morning.         . carvedilol (COREG) 25 MG tablet   Oral   Take 25 mg by mouth 2 (two) times daily with a meal.          . cyclobenzaprine (FLEXERIL) 10 MG tablet   Oral   Take 10 mg by mouth 3 (three) times daily as needed for muscle spasms.         . Desoximetasone (TOPICORT) 0.25 % ointment   Topical   Apply 1 application topically daily as needed (for rectal use). As directed         . diclofenac (VOLTAREN) 75 MG EC tablet   Oral   Take 75 mg by mouth 2 (two) times daily.         . diphenhydrAMINE (BENADRYL) 25 MG tablet    Oral   Take 50 mg by mouth 2 (two) times daily.         Marland Kitchen glimepiride (AMARYL) 2 MG tablet   Oral   Take 2 mg by mouth daily with breakfast.          . HYDROcodone-acetaminophen (NORCO) 5-325 MG per tablet   Oral   Take 1-2 tablets by mouth every 4 (four) hours as needed for moderate pain.   50 tablet   0   . lansoprazole (PREVACID) 30 MG capsule   Oral   Take 30 mg by mouth 2 (two) times daily.          Marland Kitchen lidocaine (XYLOCAINE) 5 % ointment   Topical   Apply 1 application topically 4 (four) times daily as needed.   35.44 g   0   . losartan (COZAAR) 50 MG tablet      TAKE 1 TABLET BY MOUTH ONCE DAILY   30 tablet   12   . magnesium oxide (MAG-OX) 400 MG tablet   Oral   Take 400 mg by mouth 2 (two) times daily.         . metFORMIN (GLUCOPHAGE-XR) 500 MG 24 hr tablet   Oral   Take 1,000-1,500 mg by mouth 2 (two) times daily. Take 2 tablet in the morning and 3 tablet in the evening         . simvastatin (ZOCOR) 20 MG tablet   Oral   Take 20 mg by mouth at bedtime.          . Blood Glucose Monitoring Suppl (ONE TOUCH ULTRA SYSTEM KIT) W/DEVICE KIT   Does not apply   1 kit by Does not apply route once.          BP 163/84  Pulse 71  Temp(Src) 97.9 F (36.6 C) (Oral)  Resp 18  SpO2 99% Physical Exam  Constitutional: He is oriented to person, place, and time. He appears well-developed and well-nourished. No distress.  HENT:  Head: Normocephalic.  Eyes: Conjunctivae are normal. Pupils are equal, round, and reactive to light. No scleral icterus.  Neck: Normal range of motion. Neck supple. No thyromegaly present.  Cardiovascular: Normal rate and regular rhythm.  Exam reveals no gallop and no friction rub.   No murmur heard. Pulmonary/Chest: Effort normal and breath sounds normal. No respiratory distress. He has no wheezes. He has no rales.  Abdominal: Soft. Bowel sounds are normal. He exhibits no distension. There is no tenderness. There is no rebound.   Genitourinary:     Musculoskeletal: Normal range of motion.  Neurological: He is alert and oriented to person,  place, and time.  Skin: Skin is warm and dry. No rash noted.  Psychiatric: He has a normal mood and affect. His behavior is normal.    ED Course  Procedures (including critical care time) Labs Review Labs Reviewed - No data to display Imaging Review Dg Abd 1 View  04/05/2013   CLINICAL DATA:  Rectal fissure surgery 1 week ago. Rectal pain. Rectal bleeding.  EXAM: ABDOMEN - 1 VIEW  COMPARISON:  No priors.  FINDINGS: Gas and stool are seen scattered throughout the colon extending to the level of the distal rectum. No pathologic distension of small bowel is noted. No gross evidence of pneumoperitoneum.  IMPRESSION: 1.  Nonobstructive bowel gas pattern. 2. No pneumoperitoneum.   Electronically Signed   By: [name] M.D.   On: 04/05/2013 12:32    EKG Interpretation   None       MDM   Final diagnoses:  Postoperative pain    Patient seen and evaluated here.  Also, seen by general surgery Dr. [name].  Symptoms are well-controlled.  Plan for discharge home.  Continue current care as per Dr. [name]' orders.    Tanna Furry, MD 04/05/13 [phone]

## 2013-04-05 NOTE — Progress Notes (Signed)
Derek [name] is a 68 y.o. male who is status post a lLateral internal anal sphincterotomy, two-column internal hemorrhoidectomy and fissurectomy with V-Y advancement flap closure.   on 2/10.  He presented to the ED 2 days ago with urinary retention and now has a catheter and leg bag.  He now arrives back in the ED via EMS with severe rectal pain.  He is passing flatus but hasn't had a BM.  He has a lot of perineal swelling and some bleeding.    Objective: Filed Vitals:   04/05/13 1143  BP: 137/77  Pulse: 71  Temp: 97.9 F (36.6 C)  Resp: 18    General appearance: cooperative GI: normal findings: soft, non-tender  Incision: healing well   Assessment: s/p  Patient Active Problem List   Diagnosis Date Noted  . Anal fissure 02/04/2013  . Meniere's disease   . GERD (gastroesophageal reflux disease)   . Kidney stone   . Hypertension   . Obstructive sleep apnea   . Morbid obesity   . Synovitis of knee 03/01/2012  . Chronic cough 11/03/2011  . COSTOCHONDRITIS, LEFT 10/08/2009  . RIB PAIN, LEFT SIDED 10/08/2009    Plan: Labs, vitals stable.  AXR shows minimal stool in rectal vault.  Counseled pt to continue fiber supplements and start a liquid diet.  Use appropriate toileting habits.  Sitz baths TID and PRN, lidocaine ointment PRN.  Minimize narcotic use.  Miralax BID until BM.      Marland KitchenRosario Adie, [facility], [place]   04/05/2013 1:52 PM

## 2013-04-05 NOTE — ED Notes (Signed)
He states he had rectal fissure correction less than a week ago--c/o much rectal pain today.  He also has a foley cath. Attached to leg-bag upon arrival draining clear amber urine.  His wife is with him.  He c/o minimal/scant rectal bleeding today.

## 2013-04-05 NOTE — ED Notes (Signed)
He states his discomfort is "to a minimum of what it was".  He states "the doctor that did the surgery didn't tell me it was gonna hurt like this."  He is complimentary of his care here.

## 2013-04-05 NOTE — Discharge Instructions (Signed)
Continue your care, as per your surgeons orders. Recheck with any worsening symptoms

## 2013-04-07 ENCOUNTER — Encounter (INDEPENDENT_AMBULATORY_CARE_PROVIDER_SITE_OTHER): Payer: Self-pay

## 2013-04-07 ENCOUNTER — Emergency Department (HOSPITAL_BASED_OUTPATIENT_CLINIC_OR_DEPARTMENT_OTHER)
Admission: EM | Admit: 2013-04-07 | Discharge: 2013-04-08 | Disposition: A | Discharge status: Home / Self Care | Payer: Medicare FFS | Attending: Emergency Medicine | Admitting: Emergency Medicine

## 2013-04-07 ENCOUNTER — Ambulatory Visit (INDEPENDENT_AMBULATORY_CARE_PROVIDER_SITE_OTHER): Payer: Medicare HMO

## 2013-04-07 ENCOUNTER — Encounter (HOSPITAL_BASED_OUTPATIENT_CLINIC_OR_DEPARTMENT_OTHER): Payer: Self-pay | Admitting: Emergency Medicine

## 2013-04-07 DIAGNOSIS — Z7982 Long term (current) use of aspirin: Secondary | ICD-10-CM | POA: Insufficient documentation

## 2013-04-07 DIAGNOSIS — Z79899 Other long term (current) drug therapy: Secondary | ICD-10-CM | POA: Insufficient documentation

## 2013-04-07 DIAGNOSIS — K219 Gastro-esophageal reflux disease without esophagitis: Secondary | ICD-10-CM | POA: Insufficient documentation

## 2013-04-07 DIAGNOSIS — N390 Urinary tract infection, site not specified: Secondary | ICD-10-CM | POA: Insufficient documentation

## 2013-04-07 DIAGNOSIS — Z791 Long term (current) use of non-steroidal anti-inflammatories (NSAID): Secondary | ICD-10-CM | POA: Insufficient documentation

## 2013-04-07 DIAGNOSIS — IMO0002 Reserved for concepts with insufficient information to code with codable children: Secondary | ICD-10-CM | POA: Insufficient documentation

## 2013-04-07 DIAGNOSIS — Z862 Personal history of diseases of the blood and blood-forming organs and certain disorders involving the immune mechanism: Secondary | ICD-10-CM | POA: Insufficient documentation

## 2013-04-07 DIAGNOSIS — Z87442 Personal history of urinary calculi: Secondary | ICD-10-CM | POA: Insufficient documentation

## 2013-04-07 DIAGNOSIS — Z8659 Personal history of other mental and behavioral disorders: Secondary | ICD-10-CM | POA: Insufficient documentation

## 2013-04-07 DIAGNOSIS — Z87891 Personal history of nicotine dependence: Secondary | ICD-10-CM | POA: Insufficient documentation

## 2013-04-07 DIAGNOSIS — Z8669 Personal history of other diseases of the nervous system and sense organs: Secondary | ICD-10-CM | POA: Insufficient documentation

## 2013-04-07 DIAGNOSIS — R339 Retention of urine, unspecified: Secondary | ICD-10-CM | POA: Insufficient documentation

## 2013-04-07 DIAGNOSIS — E119 Type 2 diabetes mellitus without complications: Secondary | ICD-10-CM | POA: Insufficient documentation

## 2013-04-07 DIAGNOSIS — M129 Arthropathy, unspecified: Secondary | ICD-10-CM | POA: Insufficient documentation

## 2013-04-07 DIAGNOSIS — I10 Essential (primary) hypertension: Secondary | ICD-10-CM | POA: Insufficient documentation

## 2013-04-07 NOTE — ED Notes (Signed)
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

## 2013-04-07 NOTE — Progress Notes (Unsigned)
Patient in for nurse only foley removal; Cleansed area with povidone -Iodine swab, removed 5cc saline form bub, By sterile technique removed foley cath. 20 cc clear urine noted Patient tolerated well. Advised for patient to increase fluids, to call if he does not void within 4-6 hrs,or increase abdomen pain,or  temp 100.3 or greater. Patient verbalized understanding

## 2013-04-08 LAB — URINE MICROSCOPIC-ADD ON

## 2013-04-08 LAB — URINALYSIS, ROUTINE W REFLEX MICROSCOPIC
Bilirubin Urine: NEGATIVE
Glucose, UA: >1000 mg/dL — AB
Ketones, ur: NEGATIVE mg/dL
Nitrite: NEGATIVE
Protein, ur: NEGATIVE mg/dL
Specific Gravity, Urine: 1.031 — ABNORMAL HIGH (ref 1.005–1.030)
Urobilinogen, UA: 0.2 mg/dL (ref 0.0–1.0)
pH: 5.5 (ref 5.0–8.0)

## 2013-04-08 MED ORDER — CIPROFLOXACIN HCL 500 MG PO TABS: 500.0000 mg | ORAL_TABLET | Freq: Two times a day (BID) | ORAL | Status: DC

## 2013-04-08 MED ORDER — CIPROFLOXACIN HCL 500 MG PO TABS
500.0000 mg | ORAL_TABLET | Freq: Once | ORAL | Status: AC
  Administered 2013-04-08: 500 mg via ORAL

## 2013-04-08 MED ORDER — CIPROFLOXACIN HCL 500 MG PO TABS
ORAL_TABLET | ORAL | Status: AC
  Filled 2013-04-08: qty 1

## 2013-04-08 NOTE — ED Provider Notes (Addendum)
CSN: [id number]     Arrival date & time 04/07/13  2337 History   First MD Initiated Contact with Patient 04/07/13 2359     Chief Complaint  Patient presents with  . Urinary Retention     (Consider location/radiation/quality/duration/timing/severity/associated sxs/prior Treatment) HPI This is a 68 year old man who had repair of a rectal fistula performed 6 days ago. He has followup with his surgeon yesterday and had his Foley catheter removed. In the approximately 8 hours since he has not been able to void and feels severe distention and urgency in his bladder. He is also having moderate to severe pain at his surgical site but this is an ongoing issue with no acute changes. He does not have a urologist.  Past Medical History  Diagnosis Date  . Hypertension   . Diabetes mellitus   . Kidney stone   . GERD (gastroesophageal reflux disease)   . Meniere's disease   . Morbid obesity   . Depression   . Arthritis   . 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 Obstructive sleep apnea     pt states not now-did used to use one  . Anxiety   . PONV (postoperative nausea and vomiting)    Past Surgical History  Procedure Laterality Date  . Knee surgery  2002    rt knee  . Shoulder surgery  '93bilateral '94 lt    x3  . Hernia repair  1660    umbilical  . Inguinal hernia repair  '74,'82  . Knee arthroscopy  03/01/2012    Procedure: ARTHROSCOPY KNEE;  Surgeon: Gearlean Alf, MD;  Location: [facility];  Service: Orthopedics;  Laterality: Left;  WITH SYNOVECTOMY   . Ankle surgery  2010    left-fx  . Hardware removal  2013    ankle-lt  . Joint replacement  2009    knee-left  . Anal fistulectomy N/A 04/01/2013    Procedure: EXAM UNDER ANESTHESIA WITH ANAL FISSURectomy, sphincterotomy and internal hemorrhoidectomy;  Surgeon: Harl Bowie, MD;  Location: [place];  Service: General;  Laterality: N/A;   History reviewed. No  pertinent family history. History  Substance Use Topics  . Smoking status: Former Smoker -- 4.00 packs/day for 17 years    Types: Cigarettes    Quit date: 02/21/1975  . Smokeless tobacco: Not on file  . Alcohol Use: No    Review of Systems  All other systems reviewed and are negative.    Allergies  Morphine and related; Oxycontin; and Quinine  Home Medications   Current Outpatient Rx  Name  Route  Sig  Dispense  Refill  . aspirin EC 81 MG tablet   Oral   Take 81 mg by mouth every morning.         . Blood Glucose Monitoring Suppl (ONE TOUCH ULTRA SYSTEM KIT) W/DEVICE KIT   Does not apply   1 kit by Does not apply route once.         . carvedilol (COREG) 25 MG tablet   Oral   Take 25 mg by mouth 2 (two) times daily with a meal.          . cyclobenzaprine (FLEXERIL) 10 MG tablet   Oral   Take 10 mg by mouth 3 (three) times daily as needed for muscle spasms.         . Desoximetasone (TOPICORT) 0.25 % ointment   Topical   Apply 1  application topically daily as needed (for rectal use). As directed         . diclofenac (VOLTAREN) 75 MG EC tablet   Oral   Take 75 mg by mouth 2 (two) times daily.         . diphenhydrAMINE (BENADRYL) 25 MG tablet   Oral   Take 50 mg by mouth 2 (two) times daily.         Marland Kitchen glimepiride (AMARYL) 2 MG tablet   Oral   Take 2 mg by mouth daily with breakfast.          . HYDROcodone-acetaminophen (NORCO) 5-325 MG per tablet   Oral   Take 1-2 tablets by mouth every 4 (four) hours as needed for moderate pain.   50 tablet   0   . lansoprazole (PREVACID) 30 MG capsule   Oral   Take 30 mg by mouth 2 (two) times daily.          Marland Kitchen lidocaine (XYLOCAINE) 5 % ointment   Topical   Apply 1 application topically 4 (four) times daily as needed.   35.44 g   0   . losartan (COZAAR) 50 MG tablet      TAKE 1 TABLET BY MOUTH ONCE DAILY   30 tablet   12   . magnesium oxide (MAG-OX) 400 MG tablet   Oral   Take 400 mg by mouth  2 (two) times daily.         . metFORMIN (GLUCOPHAGE-XR) 500 MG 24 hr tablet   Oral   Take 1,000-1,500 mg by mouth 2 (two) times daily. Take 2 tablet in the morning and 3 tablet in the evening         . ondansetron (ZOFRAN ODT) 4 MG disintegrating tablet   Oral   Take 1 tablet (4 mg total) by mouth every 8 (eight) hours as needed for nausea.   20 tablet   0   . simvastatin (ZOCOR) 20 MG tablet   Oral   Take 20 mg by mouth at bedtime.           BP 164/94  Pulse 104  Temp(Src) 98.3 F (36.8 C) (Oral)  Resp 20  Ht _0  (1.854 m)  Wt 265 lb (120.203 kg)  BMI 34.97 kg/m2  SpO2 98%  Physical Exam General: Well-developed, well-nourished male in no acute distress; appearance consistent with age of record HENT: normocephalic; atraumatic Eyes: pupils equal, round and reactive to light; extraocular muscles intact Neck: supple Heart: regular rate and rhythm Lungs: clear to auscultation bilaterally Abdomen: soft; distended bladder; no masses or hepatosplenomegaly; bowel sounds present GU: Tanner 4 male, uncircumcised Rectal: Postoperative changes without erythema or purulent drainage to suggest infection Extremities: No deformity; full range of motion; pulses normal Neurologic: Awake, alert and oriented; motor function intact in all extremities and symmetric; no facial droop Skin: Warm and dry Psychiatric: Normal mood and affect    ED Course  Procedures (including critical care time)  12:17 AM Foley catheter placed by nursing staff. Urine output greater than 1 L (cloudy).  MDM   Nursing notes and vitals signs, including pulse oximetry, reviewed.  Summary of this visit's results, reviewed by myself:  Labs:  Results for orders placed during the hospital encounter of 04/07/13 (from the past 24 hour(s))  URINALYSIS, ROUTINE W REFLEX MICROSCOPIC     Status: Abnormal   Collection Time    04/08/13 12:20 AM      Result Value Ref Range  Color, Urine YELLOW  YELLOW    APPearance CLOUDY (*) CLEAR   Specific Gravity, Urine 1.031 (*) 1.005 - 1.030   pH 5.5  5.0 - 8.0   Glucose, UA >1000 (*) NEGATIVE mg/dL   Hgb urine dipstick LARGE (*) NEGATIVE   Bilirubin Urine NEGATIVE  NEGATIVE   Ketones, ur NEGATIVE  NEGATIVE mg/dL   Protein, ur NEGATIVE  NEGATIVE mg/dL   Urobilinogen, UA 0.2  0.0 - 1.0 mg/dL   Nitrite NEGATIVE  NEGATIVE   Leukocytes, UA SMALL (*) NEGATIVE  URINE MICROSCOPIC-ADD ON     Status: Abnormal   Collection Time    04/08/13 12:20 AM      Result Value Ref Range   Squamous Epithelial / LPF RARE  RARE   WBC, UA TOO NUMEROUS TO COUNT  <3 WBC/hpf   RBC / HPF 11-20  <3 RBC/hpf   Bacteria, UA MANY (*) RARE   Urine-Other MUCOUS PRESENT     12:59 AM We'll start on ciprofloxacin for UTI. Patient advised that potentiate the anti-glycemic effects of Amaryl and he should be vigilant for signs and symptoms of hypoglycemia.     Wynetta Fines, MD 04/08/13 0057  Karen Chafe China Deitrick, MD 04/08/13 0100

## 2013-04-10 ENCOUNTER — Encounter (HOSPITAL_COMMUNITY): Payer: Self-pay | Admitting: Emergency Medicine

## 2013-04-10 ENCOUNTER — Emergency Department (HOSPITAL_COMMUNITY)
Admission: EM | Admit: 2013-04-10 | Discharge: 2013-04-10 | Disposition: A | Discharge status: Home / Self Care | Payer: Medicare FFS | Attending: Emergency Medicine | Admitting: Emergency Medicine

## 2013-04-10 DIAGNOSIS — I10 Essential (primary) hypertension: Secondary | ICD-10-CM | POA: Insufficient documentation

## 2013-04-10 DIAGNOSIS — Z87891 Personal history of nicotine dependence: Secondary | ICD-10-CM | POA: Insufficient documentation

## 2013-04-10 DIAGNOSIS — E119 Type 2 diabetes mellitus without complications: Secondary | ICD-10-CM | POA: Insufficient documentation

## 2013-04-10 DIAGNOSIS — Z7982 Long term (current) use of aspirin: Secondary | ICD-10-CM | POA: Insufficient documentation

## 2013-04-10 DIAGNOSIS — Z8659 Personal history of other mental and behavioral disorders: Secondary | ICD-10-CM | POA: Insufficient documentation

## 2013-04-10 DIAGNOSIS — Z79899 Other long term (current) drug therapy: Secondary | ICD-10-CM | POA: Insufficient documentation

## 2013-04-10 DIAGNOSIS — K6289 Other specified diseases of anus and rectum: Secondary | ICD-10-CM | POA: Insufficient documentation

## 2013-04-10 DIAGNOSIS — M129 Arthropathy, unspecified: Secondary | ICD-10-CM | POA: Insufficient documentation

## 2013-04-10 DIAGNOSIS — K219 Gastro-esophageal reflux disease without esophagitis: Secondary | ICD-10-CM | POA: Insufficient documentation

## 2013-04-10 DIAGNOSIS — Z791 Long term (current) use of non-steroidal anti-inflammatories (NSAID): Secondary | ICD-10-CM | POA: Insufficient documentation

## 2013-04-10 DIAGNOSIS — Z862 Personal history of diseases of the blood and blood-forming organs and certain disorders involving the immune mechanism: Secondary | ICD-10-CM | POA: Insufficient documentation

## 2013-04-10 DIAGNOSIS — G8918 Other acute postprocedural pain: Secondary | ICD-10-CM | POA: Insufficient documentation

## 2013-04-10 DIAGNOSIS — Z87442 Personal history of urinary calculi: Secondary | ICD-10-CM | POA: Insufficient documentation

## 2013-04-10 MED ORDER — LIDOCAINE VISCOUS 2 % MT SOLN
15.0000 mL | Freq: Once | OROMUCOSAL | Status: AC
  Administered 2013-04-10: 15 mL via OROMUCOSAL
  Filled 2013-04-10: qty 15

## 2013-04-10 NOTE — ED Provider Notes (Signed)
CSN: [id number]     Arrival date & time 04/10/13  0845 History   First MD Initiated Contact with Patient 04/10/13 [phone]     Chief Complaint  Patient presents with  . Encopresis  . Post-op Problem     (Consider location/radiation/quality/duration/timing/severity/associated sxs/prior Treatment) HPI  Patient presents to the ER for evaluation of continued pain status post hemorrhoidectomy and fissure repair. Patient reports that he feels a whole next to his anus that was not there previously. Patient's pain is mild currently, but is inferior he has a bowel movement.  Past Medical History  Diagnosis Date  . Hypertension   . Diabetes mellitus   . Kidney stone   . GERD (gastroesophageal reflux disease)   . Meniere's disease   . Morbid obesity   . Depression   . Arthritis   . 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 Obstructive sleep apnea     pt states not now-did used to use one  . Anxiety   . PONV (postoperative nausea and vomiting)    Past Surgical History  Procedure Laterality Date  . Knee surgery  2002    rt knee  . Shoulder surgery  '93bilateral '94 lt    x3  . Hernia repair  [id number]    umbilical  . Inguinal hernia repair  '74,'82  . Knee arthroscopy  03/01/2012    Procedure: ARTHROSCOPY KNEE;  Surgeon: Gearlean Alf, MD;  Location: [facility];  Service: Orthopedics;  Laterality: Left;  WITH SYNOVECTOMY   . Ankle surgery  2010    left-fx  . Hardware removal  2013    ankle-lt  . Joint replacement  2009    knee-left  . Anal fistulectomy N/A 04/01/2013    Procedure: EXAM UNDER ANESTHESIA WITH ANAL FISSURectomy, sphincterotomy and internal hemorrhoidectomy;  Surgeon: Harl Bowie, MD;  Location: [place];  Service: General;  Laterality: N/A;   No family history on file. History  Substance Use Topics  . Smoking status: Former Smoker -- 4.00 packs/day for 17 years    Types: Cigarettes    Quit date:  02/21/1975  . Smokeless tobacco: Not on file  . Alcohol Use: No    Review of Systems  Gastrointestinal: Positive for rectal pain.  All other systems reviewed and are negative.      Allergies  Morphine and related; Oxycontin; and Quinine  Home Medications   Current Outpatient Rx  Name  Route  Sig  Dispense  Refill  . aspirin EC 81 MG tablet   Oral   Take 81 mg by mouth every morning.         . carvedilol (COREG) 25 MG tablet   Oral   Take 25 mg by mouth 2 (two) times daily with a meal.          . ciprofloxacin (CIPRO) 500 MG tablet   Oral   Take 1 tablet (500 mg total) by mouth 2 (two) times daily. One po bid x 7 days   14 tablet   0   . cyclobenzaprine (FLEXERIL) 10 MG tablet   Oral   Take 10 mg by mouth 3 (three) times daily as needed for muscle spasms.         . Desoximetasone (TOPICORT) 0.25 % ointment   Topical   Apply 1 application topically daily as needed (for rectal use). As directed         . diclofenac (  VOLTAREN) 75 MG EC tablet   Oral   Take 75 mg by mouth 2 (two) times daily.         . diphenhydrAMINE (BENADRYL) 25 MG tablet   Oral   Take 50 mg by mouth 2 (two) times daily.         Marland Kitchen glimepiride (AMARYL) 2 MG tablet   Oral   Take 2 mg by mouth daily with breakfast.          . HYDROcodone-acetaminophen (NORCO) 5-325 MG per tablet   Oral   Take 1-2 tablets by mouth every 4 (four) hours as needed for moderate pain.   50 tablet   0   . lansoprazole (PREVACID) 30 MG capsule   Oral   Take 30 mg by mouth 2 (two) times daily.          Marland Kitchen lidocaine (XYLOCAINE) 5 % ointment   Topical   Apply 1 application topically 4 (four) times daily as needed for mild pain.         Marland Kitchen losartan (COZAAR) 50 MG tablet   Oral   Take 50 mg by mouth every morning.         . magnesium oxide (MAG-OX) 400 MG tablet   Oral   Take 400 mg by mouth 2 (two) times daily.         . metFORMIN (GLUCOPHAGE-XR) 500 MG 24 hr tablet   Oral   Take  1,000-1,500 mg by mouth 2 (two) times daily. Take 2 tablet in the morning and 3 tablet in the evening         . ondansetron (ZOFRAN ODT) 4 MG disintegrating tablet   Oral   Take 1 tablet (4 mg total) by mouth every 8 (eight) hours as needed for nausea.   20 tablet   0   . simvastatin (ZOCOR) 20 MG tablet   Oral   Take 20 mg by mouth at bedtime.           BP 162/103  Pulse 79  Temp(Src) 97.9 F (36.6 C) (Oral)  Resp 20  SpO2 99% Physical Exam  Constitutional: He is oriented to person, place, and time. He appears well-developed and well-nourished. No distress.  HENT:  Head: Normocephalic and atraumatic.  Right Ear: Hearing normal.  Left Ear: Hearing normal.  Nose: Nose normal.  Mouth/Throat: Oropharynx is clear and moist and mucous membranes are normal.  Eyes: Conjunctivae and EOM are normal. Pupils are equal, round, and reactive to light.  Neck: Normal range of motion. Neck supple.  Cardiovascular: Regular rhythm, S1 normal and S2 normal.  Exam reveals no gallop and no friction rub.   No murmur heard. Pulmonary/Chest: Effort normal and breath sounds normal. No respiratory distress. He exhibits no tenderness.  Abdominal: Soft. Normal appearance and bowel sounds are normal. There is no hepatosplenomegaly. There is no tenderness. There is no rebound, no guarding, no tenderness at McBurney's point and negative Murphy's sign. No hernia.  Genitourinary:     Musculoskeletal: Normal range of motion.  Neurological: He is alert and oriented to person, place, and time. He has normal strength. No cranial nerve deficit or sensory deficit. Coordination normal. GCS eye subscore is 4. GCS verbal subscore is 5. GCS motor subscore is 6.  Skin: Skin is warm, dry and intact. No rash noted. No cyanosis.  Psychiatric: He has a normal mood and affect. His speech is normal and behavior is normal. Thought content normal.    ED Course  Procedures (including  critical care time) Labs Review Labs  Reviewed - No data to display Imaging Review No results found.  EKG Interpretation   None       MDM   Final diagnoses:  Post-op Pain   Patient does have a small opening to the left of his anus that is one of the surgical sites. I suspect that this has recently opened, but there is nothing to be done for this. This will heal with secondary intention. It does not appear to communicate with the rectum, was gently probed with a Q-tip. Patient was reassured. 2 new pain management with his Vicodin and topical lidocaine. Continue stool softener and MiraLax. Followup with Doctor Ninfa Linden.    Orpah Greek, MD 04/10/13 [phone]

## 2013-04-10 NOTE — ED Notes (Signed)
Pt had surgery last TUesday for anal fissure repair and hemorrhoids. Pt states that he having painful time when he uses the restroom now.  Pt states that last night when he was cleaning himself off he noticed a hole in buttock area that isnt supposed to be there that is where pt believes the BM is coming out of.  Pt states that he has having bowel incontinence.

## 2013-04-10 NOTE — Discharge Instructions (Signed)
Pain Relief Preoperatively and Postoperatively °Being a good patient does not mean being a silent one. If you have questions, problems, or concerns about the pain you may feel after surgery, let your caregiver know. Patients have the right to assessment and management of pain. The treatment of pain after surgery is important to speed up recovery and return to normal activities. Severe pain after surgery, and the fear or anxiety associated with that pain, may cause extreme discomfort that: °· Prevents sleep. °· Decreases the ability to breathe deeply and cough. This can cause pneumonia or other upper airway infections. °· Causes your heart to beat faster and your blood pressure to be higher. °· Increases the risk for constipation and bloating. °· Decreases the ability of wounds to heal. °· May result in depression, increased anxiety, and feelings of helplessness. °Relief of pain before surgery is also important because it will lessen the pain after surgery. Patients who receive both pain relief before and after surgery experience greater pain relief than those who only receive pain relief after surgery. Let your caregiver know if you are having uncontrolled pain. This is very important. Pain after surgery is more difficult to manage if it is permitted to become severe, so prompt and adequate treatment of acute pain is necessary. °PAIN CONTROL METHODS °Your caregivers follow policies and procedures about the management of patient pain. These guidelines should be explained to you before surgery. Plans for pain control after surgery must be mutually decided upon and instituted with your full understanding and agreement. Do not be afraid to ask questions regarding the care you are receiving. There are many different ways your caregivers will attempt to control your pain, including the following methods. °As needed pain control °· You may be given pain medicine either through your intravenous (IV) tube, or as a pill or  liquid you can swallow. You will need to let your caregiver know when you are having pain. Then, your caregiver will give you the pain medicine ordered for you. °· Your pain medicine may make you constipated. If constipation occurs, drink more liquids if you can. Your caregiver may have you take a mild laxative. °IV patient-controlled analgesia pump (PCA pump) °· You can get your pain medicine through the IV tube which goes into your vein. You are able to control the amount of pain medicine that you get. The pain medicine flows in through an IV tube and is controlled by a pump. This pump gives you a set amount of pain medicine when you push the button hooked up to it. Nobody should push this button but you or someone specifically assigned by you to do so. It is set up to keep you from accidentally giving yourself too much pain medicine. You will be able to start using your pain pump in the recovery room after your surgery. This method can be helpful for most types of surgery. °· If you are still having too much pain, tell your caregiver. Also, tell your caregiver if you are feeling too sleepy or nauseous. °Continuous epidural pain control °· A thin, soft tube (catheter) is put into your back. Pain medicine flows through the catheter to lessen pain in the part of your body where the surgery is done. Continuous epidural pain control may work best for you if you are having surgery on your chest, abdomen, hip area, or legs. The epidural catheter is usually put into your back just before surgery. The catheter is left in until you can eat and take medicine by mouth. In most cases,   this may take 2 to 3 days.  Giving pain medicine through the epidural catheter may help you heal faster because:  Your bowel gets back to normal faster.  You can get back to eating sooner.  You can be up and walking sooner. Medicine that numbs the area (local anesthetic)  You may receive an injection of pain medicine near where the  pain is (local infiltration).  You may receive an injection of pain medicine near the nerve that controls the sensation to a specific part of the body (peripheral nerve block).  Medicine may be put in the spine to block pain (spinal block). Opioids  Moderate to moderately severe acute pain after surgery may respond to opioids.Opioids are narcotic pain medicine. Opioids are often combined with non-narcotic medicines to improve pain relief, diminish the risk of side effects, and reduce the chance of addiction.  If you follow your caregiver's directions about taking opioids and you do not have a history of substance abuse, your risk of becoming addicted is exceptionally small.Opioids are given for short periods of time in careful doses to prevent addiction. Other methods of pain control include:  Steroids.  Physical therapy.  Heat and cold therapy.  Compression, such as wrapping an elastic bandage around the area of pain.  Massage. These various ways of controlling pain may be used together. Combining different methods of pain control is called multimodal analgesia. Using this approach has many benefits, including being able to eat, move around, and leave the hospital sooner. Document Released: 04/29/2002 Document Revised: 05/01/2011 Document Reviewed: 05/03/2010 [facility] Patient Information 2014 [place].

## 2013-04-15 ENCOUNTER — Encounter (INDEPENDENT_AMBULATORY_CARE_PROVIDER_SITE_OTHER): Payer: Self-pay | Admitting: Surgery

## 2013-04-15 ENCOUNTER — Ambulatory Visit (INDEPENDENT_AMBULATORY_CARE_PROVIDER_SITE_OTHER): Payer: Medicare HMO | Admitting: Surgery

## 2013-04-15 VITALS — BP 133/87 | HR 84 | Temp 98.1°F | Resp 18 | Ht 73.0 in | Wt 255.8 lb

## 2013-04-15 DIAGNOSIS — Z09 Encounter for follow-up examination after completed treatment for conditions other than malignant neoplasm: Secondary | ICD-10-CM

## 2013-04-15 MED ORDER — HYDROCODONE-ACETAMINOPHEN 5-325 MG PO TABS: 1.0000 | ORAL_TABLET | ORAL | Status: DC | PRN

## 2013-04-15 MED ORDER — HYDROCORTISONE ACE-PRAMOXINE 2.5-1 % RE CREA: 1.0000 "application " | TOPICAL_CREAM | Freq: Four times a day (QID) | RECTAL | Status: DC

## 2013-04-15 NOTE — Progress Notes (Signed)
Subjective:     Patient ID: Derek [name], male   DOB: 10-21-1945, 68 y.o.   MRN: [id number]  HPI He is here for his first postop visit status post 2 column hemorrhoidectomy, sphincterotomy and fissurectomy. He had extensive postoperative pain and urinary retention. He still has a Foley catheter in place. He has not been Able to do sitz bath. He is on a stool softener and his bowel movements have improved  Review of Systems     Objective:   Physical Exam On exam, he has open areas from the hemorrhoidectomies and fissurectomy which are healing well with some swelling. There is no evidence of infection    Assessment:     Patient stable postop     Plan:     I encouraged him to try sitz baths. I renewed his Percocet. I also wrote him for Analpram to help with the swelling. I will see him back in 3 weeks

## 2013-05-01 ENCOUNTER — Other Ambulatory Visit: Payer: Self-pay | Admitting: Orthopedic Surgery

## 2013-05-01 ENCOUNTER — Other Ambulatory Visit: Payer: Self-pay | Admitting: Cardiology

## 2013-05-05 ENCOUNTER — Other Ambulatory Visit (INDEPENDENT_AMBULATORY_CARE_PROVIDER_SITE_OTHER): Payer: Medicare FFS

## 2013-05-05 DIAGNOSIS — Z79899 Other long term (current) drug therapy: Secondary | ICD-10-CM

## 2013-05-05 LAB — ALT: ALT: 22 U/L (ref 0–53)

## 2013-05-06 LAB — NMR LIPOPROFILE WITH LIPIDS
Cholesterol, Total: 109 mg/dL (ref <200)
HDL Particle Number: 32.1 umol/L (ref >=30.5)
HDL Size: 9.4 nm (ref >=9.2)
HDL-C: 43 mg/dL (ref >=40)
LDL (calc): 42 mg/dL (ref <100)
LDL Particle Number: 447 nmol/L (ref <1000)
LDL Size: 20.4 nm — ABNORMAL LOW (ref >20.5)
LP-IR Score: 53 — ABNORMAL HIGH (ref <=45)
Large HDL-P: 4.9 umol/L (ref >=4.8)
Large VLDL-P: 4.4 nmol/L — ABNORMAL HIGH (ref <=2.7)
Small LDL Particle Number: 219 nmol/L (ref <=527)
Triglycerides: 120 mg/dL (ref <150)
VLDL Size: 51.4 nm — ABNORMAL HIGH (ref <=46.6)

## 2013-05-08 ENCOUNTER — Encounter (INDEPENDENT_AMBULATORY_CARE_PROVIDER_SITE_OTHER): Payer: Self-pay | Admitting: Surgery

## 2013-05-08 ENCOUNTER — Ambulatory Visit (INDEPENDENT_AMBULATORY_CARE_PROVIDER_SITE_OTHER): Payer: Medicare HMO | Admitting: Surgery

## 2013-05-08 VITALS — BP 140/80 | HR 68 | Temp 98.5°F | Resp 14 | Ht 73.0 in | Wt 267.0 lb

## 2013-05-08 DIAGNOSIS — Z09 Encounter for follow-up examination after completed treatment for conditions other than malignant neoplasm: Secondary | ICD-10-CM

## 2013-05-08 NOTE — Progress Notes (Signed)
Subjective:     Patient ID: Derek [name], male   DOB: 14-Jan-1946, 68 y.o.   MRN: [id number]  HPI  He is here for another postoperative visit. He reports that now he has almost no discomfort in the perianal area. He is moving his bowels well. Review of Systems     Objective:   Physical Exam On exam, his incisions are all well healed and there is minimal perianal swelling    Assessment:     Patient stable postop     Plan:     He is going to be undergoing a knee replacement same. I encouraged him to stay on a stool softener in the perioperative period to avoid constipation. I'll see him back as needed

## 2013-05-16 ENCOUNTER — Encounter (HOSPITAL_COMMUNITY): Payer: Self-pay | Admitting: Pharmacy Technician

## 2013-05-22 ENCOUNTER — Ambulatory Visit (HOSPITAL_COMMUNITY)
Admission: RE | Admit: 2013-05-22 | Discharge: 2013-05-22 | Disposition: A | Discharge status: Home / Self Care | Payer: Medicare FFS | Source: Ambulatory Visit | Attending: Anesthesiology | Admitting: Anesthesiology

## 2013-05-22 ENCOUNTER — Encounter (HOSPITAL_COMMUNITY)
Admission: RE | Admit: 2013-05-22 | Discharge: 2013-05-22 | Disposition: A | Discharge status: Home / Self Care | Payer: Medicare FFS | Source: Ambulatory Visit | Attending: Orthopedic Surgery | Admitting: Orthopedic Surgery

## 2013-05-22 ENCOUNTER — Encounter (HOSPITAL_COMMUNITY): Payer: Self-pay

## 2013-05-22 ENCOUNTER — Encounter (INDEPENDENT_AMBULATORY_CARE_PROVIDER_SITE_OTHER): Payer: Self-pay

## 2013-05-22 DIAGNOSIS — Z01818 Encounter for other preprocedural examination: Secondary | ICD-10-CM | POA: Insufficient documentation

## 2013-05-22 DIAGNOSIS — Z01812 Encounter for preprocedural laboratory examination: Secondary | ICD-10-CM | POA: Insufficient documentation

## 2013-05-22 DIAGNOSIS — K449 Diaphragmatic hernia without obstruction or gangrene: Secondary | ICD-10-CM | POA: Insufficient documentation

## 2013-05-22 DIAGNOSIS — I10 Essential (primary) hypertension: Secondary | ICD-10-CM | POA: Insufficient documentation

## 2013-05-22 DIAGNOSIS — E119 Type 2 diabetes mellitus without complications: Secondary | ICD-10-CM | POA: Insufficient documentation

## 2013-05-22 HISTORY — DX: Personal history of urinary calculi: Z87.442

## 2013-05-22 HISTORY — DX: Dermatitis, unspecified: L30.9

## 2013-05-22 LAB — PROTIME-INR
INR: 1.03 (ref 0.00–1.49)
Prothrombin Time: 13.3 seconds (ref 11.6–15.2)

## 2013-05-22 LAB — CBC
HCT: 36.8 % — ABNORMAL LOW (ref 39.0–52.0)
Hemoglobin: 12.3 g/dL — ABNORMAL LOW (ref 13.0–17.0)
MCH: 27.5 pg (ref 26.0–34.0)
MCHC: 33.4 g/dL (ref 30.0–36.0)
MCV: 82.3 fL (ref 78.0–100.0)
Platelets: 166 10*3/uL (ref 150–400)
RBC: 4.47 MIL/uL (ref 4.22–5.81)
RDW: 12.9 % (ref 11.5–15.5)
WBC: 4 10*3/uL (ref 4.0–10.5)

## 2013-05-22 LAB — COMPREHENSIVE METABOLIC PANEL
ALT: 17 U/L (ref 0–53)
AST: 16 U/L (ref 0–37)
Albumin: 4 g/dL (ref 3.5–5.2)
Alkaline Phosphatase: 97 U/L (ref 39–117)
BUN: 19 mg/dL (ref 6–23)
CO2: 24 mEq/L (ref 19–32)
Calcium: 10.9 mg/dL — ABNORMAL HIGH (ref 8.4–10.5)
Chloride: 102 mEq/L (ref 96–112)
Creatinine, Ser: 0.94 mg/dL (ref 0.50–1.35)
GFR calc Af Amer: >90 mL/min (ref >90)
GFR calc non Af Amer: 84 mL/min — ABNORMAL LOW (ref >90)
Glucose, Bld: 262 mg/dL — ABNORMAL HIGH (ref 70–99)
Potassium: 5.2 mEq/L (ref 3.7–5.3)
Sodium: 136 mEq/L — ABNORMAL LOW (ref 137–147)
Total Bilirubin: 0.3 mg/dL (ref 0.3–1.2)
Total Protein: 7.1 g/dL (ref 6.0–8.3)

## 2013-05-22 LAB — URINALYSIS, ROUTINE W REFLEX MICROSCOPIC
Bilirubin Urine: NEGATIVE
Glucose, UA: >1000 mg/dL — AB
Ketones, ur: NEGATIVE mg/dL
Leukocytes, UA: NEGATIVE
Nitrite: NEGATIVE
Protein, ur: NEGATIVE mg/dL
Specific Gravity, Urine: 1.027 (ref 1.005–1.030)
Urobilinogen, UA: 0.2 mg/dL (ref 0.0–1.0)
pH: 6 (ref 5.0–8.0)

## 2013-05-22 LAB — APTT: aPTT: 29 seconds (ref 24–37)

## 2013-05-22 LAB — SURGICAL PCR SCREEN
MRSA, PCR: POSITIVE — AB
Staphylococcus aureus: POSITIVE — AB

## 2013-05-22 LAB — URINE MICROSCOPIC-ADD ON

## 2013-05-22 NOTE — Progress Notes (Signed)
05-22-13 1635 Labs viewable in Epic-please note. Pt. Is known Diabetic, taking oral meds. Blood sugar will be checked on arrival fasting to Short Stay.

## 2013-05-22 NOTE — Patient Instructions (Signed)
20 Derek [name]  05/22/2013   Your procedure is scheduled on:  4-13 -2015  Report to [facility] at      1200 noon.  Call this number if you have problems the morning of surgery: [phone]  Or Presurgical Testing [phone](Makennah Omura) For Living Will and/or Health Care Power Attorney Forms: please provide copy for your medical record,may bring AM of surgery(Forms should be already notarized -we do not provide this service).(06-02-13 to bring Living will to place with records)  Remember: Follow any bowel prep instructions per MD office.   Do not eat food:After Midnight.  May have clear liquids:up to 6 Hours before arrival. Nothing after : 0900 AM  Clear liquids include soda, tea, black coffee, apple or grape juice, broth.  Take these medicines the morning of surgery with A SIP OF WATER: Carvedilol. Hydrocodone. Lansoprazole. Do not take any Diabetic meds AM of.   Do not wear jewelry, make-up or nail polish.  Do not wear lotions, powders, or perfumes. You may wear deodorant.  Do not shave 48 hours(2 days) prior to first CHG shower(legs and under arms).(Shaving face and neck okay.)  Do not bring valuables to the hospital.(Hospital is not responsible for lost valuables).  Contacts, dentures or removable bridgework, body piercing, hair pins may not be worn into surgery.  Leave suitcase in the car. After surgery it may be brought to your room.  For patients admitted to the hospital, checkout time is 11:00 AM the day of discharge.(Restricted visitors-Any Persons displaying flu-like symptoms or illness).    Patients discharged the day of surgery will not be allowed to drive home. Must have responsible person with you x 24 hours once discharged.  Name and phone number of your driver: Kalum Minner [phone] cell  Special Instructions: CHG(Chlorhedine 4%-"Hibiclens","Betasept","Aplicare") Shower Use Special Wash: see special instructions.(avoid face and  genitals)   Please read over the following fact sheets that you were given: MRSA Information, Blood Transfusion fact sheet, Incentive Spirometry Instruction.  Remember : Type/Screen "Blue armbands" - may not be removed once applied(would result in being retested AM of surgery, if removed).  Failure to follow these instructions may result in Cancellation of your surgery.   Patient signature_______________________________________________________

## 2013-05-22 NOTE — Progress Notes (Signed)
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 -Mupirocin Ointment and require Contact Isolation while here.Pt. Aware-spoke with spouse. Rx. Called to [facility] pt. Pharmacy [phone].

## 2013-05-22 NOTE — Pre-Procedure Instructions (Addendum)
05-22-13 EKG 01-05-13 Epic.CXR done today. 05-22-13 1630 Pt. Spouse made aware of Positive MRSA -PCR screen-will get Mupirocin from Pharmacy and use as directed, also aware of Contact Isolation will be needed during stay.Also note to office-labs viewable in Epic -please note.W. Floy Sabina

## 2013-06-01 ENCOUNTER — Other Ambulatory Visit: Payer: Self-pay | Admitting: Orthopedic Surgery

## 2013-06-01 NOTE — H&P (Signed)
Derek [name] DOB: 12-27-1945 Married / Language: English / Race: White Male  Date of Admission:  06-02-2013  Chief Complaint:  Right Knee Pain  History of Present Illness The patient is a 68 year old male who comes in for a preoperative History and Physical. The patient is scheduled for a right total knee arthroplasty to be performed by Dr. Dione Plover. Aluisio, MD at [facility] on 06-02-2013. The patient is a 68 year old male who presents for follow up of their knee. The patient is being followed for their bilateral knee pain and osteoarthritis. They are now 5 year(s) out from left total knee (and 1 year out from arthroscopic synovectomy). Symptoms reported today include: pain, aching, catching, instability and difficulty ambulating. The patient feels that they are doing poorly and report their pain level to be moderate. The following medication has been used for pain control: none. He has had multiple cortisone injections as well as visco in the right knee, these were several years ago. He feels like the left knee is doing fine. The right knee is giving him a lot of difficulty. It is hurting him at all times. It is limiting what he can and can not do. It is hurting as bad as the left one did prior to when he had the left one replaced. He said he feels like he might be getting a little scar tissue again in the left knee but it is not bothering him to the point where he needs to do anything. He is ready to get the right knee fixed. They have been treated conservatively in the past for the above stated problem and despite conservative measures, they continue to have progressive pain and severe functional limitations and dysfunction. They have failed non-operative management including home exercise, medications, and injections. It is felt that they would benefit from undergoing total joint replacement. Risks and benefits of the procedure have been discussed with the patient and they  elect to proceed with surgery. There are no active contraindications to surgery such as ongoing infection or rapidly progressive neurological disease.    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 09/04/2003 MULTIPLE SENSITIVITIES TO OPIOIDS    Family History Heart Disease. grandfather mothers side grandmother fathers side and grandfather fathers side Diabetes Mellitus. grandmother mothers side Cancer. First Degree Relatives. father    Social History Exercise. Exercises never Drug/Alcohol Rehab (Previously). no Living situation. live with spouse Illicit drug use. no Drug/Alcohol Rehab (Currently). no Alcohol use. former drinker Previously in rehab. no Current work status. retired disabled Children. 2 Marital status. married Number of flights of stairs before winded. 1 less than 1 Pain Contract. yes Tobacco use. Former smoker. former smoker; smoke(d) 3 or more pack(s) per day former smoker; smoke(d) 1 1/2 pack(s) per day Tobacco / smoke exposure. no    Medication History Zanaflex ('4MG'$  Tablet, 1 (one) Tablet Oral every eight hours, as needed for spasm, Taken starting 05/09/2013) Active. Aspirin EC ('81MG'$  Tablet DR, Oral) Active. Losartan Potassium ('50MG'$  Tablet, Oral) Active. Glimepiride ('2MG'$  Tablet, Oral) Active. Carvedilol ('25MG'$  Tablet, Oral) Active. MetFORMIN HCl ER ('500MG'$  Tablet ER 24HR, Oral) Active. Simvastatin ('20MG'$  Tablet, Oral) Active. Lansoprazole ('30MG'$  Capsule DR, Oral) Active. Magnesium Gluconate ('500MG'$  Tablet, Oral) Inactive.    Past Surgical History Inguinal Hernia Repair. open: bilateral open: bilateral and multiple times Foot Surgery. left Rotator Cuff Repair. bilateral Total Knee Replacement. left Ankle  Surgery. left Arthroscopy of Knee. 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 Enlarged Depression High blood pressure History of Postop Urinary Retention    Review of Systems General:Not Present- Chills, Fever, Night Sweats, Fatigue, Weight Gain, Weight Loss and Memory Loss. Skin:Not Present- Hives, Itching, Rash, Eczema and Lesions. HEENT:Not Present- Tinnitus, Headache, Double Vision, Visual Loss, Hearing Loss and Dentures. Respiratory:Not Present- Shortness of breath with exertion, Shortness of breath at rest, Allergies, Coughing up blood and Chronic Cough. Cardiovascular:Not Present- Chest Pain, Racing/skipping heartbeats, Difficulty Breathing Lying Down, Murmur, Swelling and Palpitations. Gastrointestinal:Not Present- Bloody Stool, Heartburn, Abdominal Pain, Vomiting, Nausea, Constipation, Diarrhea, Difficulty Swallowing, Jaundice and Loss of appetitie. Male Genitourinary:Not Present- Urinary frequency, Blood in Urine, Weak urinary stream, Discharge, Flank Pain, Incontinence, Painful Urination, Urgency, Urinary Retention and Urinating at Night. Musculoskeletal:Not Present- Muscle Weakness, Muscle Pain, Joint Swelling, Joint Pain, Back Pain, Morning Stiffness and Spasms. Neurological:Not Present- Tremor, Dizziness, Blackout spells, Paralysis, Difficulty with balance and Weakness. Psychiatric:Not Present- Insomnia.    Vitals Pulse: 76 (Regular) Resp.: 14 (Unlabored) BP: 126/78 (Sitting, Left Arm, Standard)   Physical Exam The physical exam findings are as follows:   General Mental Status - Alert, cooperative and good historian. General Appearance- pleasant. Not in acute distress. Orientation- Oriented X3. Build & Nutrition- Well nourished and Well developed.   Head and Neck Head- normocephalic, atraumatic . Neck Global Assessment- supple. no bruit auscultated  on the right and no bruit auscultated on the left.   Eye Pupil- Bilateral- Regular and Round. Motion- Bilateral- EOMI.   ENMT lower partial denture plate  Chest and Lung Exam Auscultation: Breath sounds:- clear at anterior chest wall and - clear at posterior chest wall. Adventitious sounds:- No Adventitious sounds.   Cardiovascular Auscultation:Rhythm- Regular rate and rhythm. Heart Sounds- S1 WNL and S2 WNL. Murmurs & Other Heart Sounds:Auscultation of the heart reveals - No Murmurs.   Abdomen Inspection:Contour- Generalized mild distention. Palpation/Percussion:Tenderness- Abdomen is non-tender to palpation. Rigidity (guarding)- Abdomen is soft. Auscultation:Auscultation of the abdomen reveals - Bowel sounds normal.   Male Genitourinary Not done, not pertinent to present illness  Musculoskeletal On exam he is alert and oriented in no apparent distress. Evaluation of his left knee shows no swelling. Range about 0 to 125 with on crepitus on range of motion. No tenderness or instability. His right knee shows varus deformity. Range about 5 to 120. Marked crepitus on range of motion. He has tenderness medial greater than lateral with no instability noted. Pulses, sensation and motor are intact.  RADIOGRAPHS: AP both knees and lateral show prosthesis on the left is in excellent position with no periprosthetic abnormalities. On the right he has significant bone on bone arthritis medial and patellofemoral compartments.   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 Wynelle Link.  Plan is to go home.  PCP - Dr. Harrington Challenger Cardiologist - Dr. Candee Furbish Urology - Dr. Jasmine December  The patient does not have any contraindications and will receive TXA (tranexamic acid) prior to surgery.   Signed electronically by  Joelene Millin, III PA-C

## 2013-06-02 ENCOUNTER — Encounter (HOSPITAL_COMMUNITY): Payer: Self-pay | Admitting: *Deleted

## 2013-06-02 ENCOUNTER — Encounter (HOSPITAL_COMMUNITY)
Admission: RE | Disposition: A | Discharge status: Home Health | Payer: Self-pay | Source: Ambulatory Visit | Attending: Orthopedic Surgery

## 2013-06-02 ENCOUNTER — Inpatient Hospital Stay (HOSPITAL_COMMUNITY)
Admission: RE | Admit: 2013-06-02 | Discharge: 2013-06-04 | DRG: 470 | Disposition: A | Discharge status: Home Health | Payer: Medicare FFS | Source: Ambulatory Visit | Attending: Orthopedic Surgery | Admitting: Orthopedic Surgery

## 2013-06-02 ENCOUNTER — Inpatient Hospital Stay (HOSPITAL_COMMUNITY): Payer: Medicare FFS | Admitting: Certified Registered"

## 2013-06-02 ENCOUNTER — Encounter (HOSPITAL_COMMUNITY): Payer: Medicare FFS | Admitting: Certified Registered"

## 2013-06-02 DIAGNOSIS — F411 Generalized anxiety disorder: Secondary | ICD-10-CM | POA: Diagnosis present

## 2013-06-02 DIAGNOSIS — E119 Type 2 diabetes mellitus without complications: Secondary | ICD-10-CM | POA: Diagnosis present

## 2013-06-02 DIAGNOSIS — G4733 Obstructive sleep apnea (adult) (pediatric): Secondary | ICD-10-CM

## 2013-06-02 DIAGNOSIS — H9190 Unspecified hearing loss, unspecified ear: Secondary | ICD-10-CM | POA: Diagnosis present

## 2013-06-02 DIAGNOSIS — Z96651 Presence of right artificial knee joint: Secondary | ICD-10-CM

## 2013-06-02 DIAGNOSIS — K219 Gastro-esophageal reflux disease without esophagitis: Secondary | ICD-10-CM

## 2013-06-02 DIAGNOSIS — I10 Essential (primary) hypertension: Secondary | ICD-10-CM

## 2013-06-02 DIAGNOSIS — M1710 Unilateral primary osteoarthritis, unspecified knee: Principal | ICD-10-CM | POA: Diagnosis present

## 2013-06-02 DIAGNOSIS — Z8249 Family history of ischemic heart disease and other diseases of the circulatory system: Secondary | ICD-10-CM

## 2013-06-02 DIAGNOSIS — Z6834 Body mass index (BMI) 34.0-34.9, adult: Secondary | ICD-10-CM

## 2013-06-02 DIAGNOSIS — M179 Osteoarthritis of knee, unspecified: Secondary | ICD-10-CM

## 2013-06-02 DIAGNOSIS — Z833 Family history of diabetes mellitus: Secondary | ICD-10-CM

## 2013-06-02 DIAGNOSIS — Z96659 Presence of unspecified artificial knee joint: Secondary | ICD-10-CM

## 2013-06-02 DIAGNOSIS — E871 Hypo-osmolality and hyponatremia: Secondary | ICD-10-CM

## 2013-06-02 HISTORY — PX: TOTAL KNEE ARTHROPLASTY: SHX125

## 2013-06-02 LAB — TYPE AND SCREEN
ABO/RH(D): O POS
Antibody Screen: NEGATIVE

## 2013-06-02 LAB — GLUCOSE, CAPILLARY
Glucose-Capillary: 256 mg/dL — ABNORMAL HIGH (ref 70–99)
Glucose-Capillary: 235 mg/dL — ABNORMAL HIGH (ref 70–99)
Glucose-Capillary: 236 mg/dL — ABNORMAL HIGH (ref 70–99)
Glucose-Capillary: 226 mg/dL — ABNORMAL HIGH (ref 70–99)
Glucose-Capillary: 222 mg/dL — ABNORMAL HIGH (ref 70–99)
Glucose-Capillary: 183 mg/dL — ABNORMAL HIGH (ref 70–99)
Glucose-Capillary: 220 mg/dL — ABNORMAL HIGH (ref 70–99)

## 2013-06-02 LAB — ABO/RH: ABO/RH(D): O POS

## 2013-06-02 SURGERY — ARTHROPLASTY, KNEE, TOTAL
Anesthesia: Spinal | Site: Knee | Laterality: Right

## 2013-06-02 MED ORDER — BUPIVACAINE LIPOSOME 1.3 % IJ SUSP
20.0000 mL | Freq: Once | INTRAMUSCULAR | Status: DC
  Filled 2013-06-02: qty 20

## 2013-06-02 MED ORDER — INSULIN ASPART 100 UNIT/ML ~~LOC~~ SOLN
0.0000 [IU] | Freq: Three times a day (TID) | SUBCUTANEOUS | Status: DC
Start: 2013-06-03 — End: 2013-06-04
  Administered 2013-06-03: 8 [IU] via SUBCUTANEOUS
  Administered 2013-06-03: 15 [IU] via SUBCUTANEOUS
  Administered 2013-06-03 – 2013-06-04 (×2): 11 [IU] via SUBCUTANEOUS
  Administered 2013-06-04: 8 [IU] via SUBCUTANEOUS

## 2013-06-02 MED ORDER — FLEET ENEMA 7-19 GM/118ML RE ENEM: 1.0000 | ENEMA | Freq: Once | RECTAL | Status: AC | PRN

## 2013-06-02 MED ORDER — ACETAMINOPHEN 10 MG/ML IV SOLN
1000.0000 mg | Freq: Once | INTRAVENOUS | Status: AC
  Administered 2013-06-02: 1000 mg via INTRAVENOUS
  Filled 2013-06-02: qty 100

## 2013-06-02 MED ORDER — HYDROMORPHONE HCL PF 1 MG/ML IJ SOLN
0.5000 mg | INTRAMUSCULAR | Status: DC | PRN
  Administered 2013-06-02 (×2): 0.5 mg via INTRAVENOUS
  Administered 2013-06-03: 1 mg via INTRAVENOUS
  Filled 2013-06-02 (×3): qty 1

## 2013-06-02 MED ORDER — LOSARTAN POTASSIUM 50 MG PO TABS
50.0000 mg | ORAL_TABLET | Freq: Every morning | ORAL | Status: DC
  Administered 2013-06-04: 50 mg via ORAL
  Filled 2013-06-02 (×2): qty 1

## 2013-06-02 MED ORDER — METHOCARBAMOL 100 MG/ML IJ SOLN
500.0000 mg | Freq: Four times a day (QID) | INTRAMUSCULAR | Status: DC | PRN
  Administered 2013-06-02: 500 mg via INTRAVENOUS
  Filled 2013-06-02 (×2): qty 5

## 2013-06-02 MED ORDER — CEFAZOLIN SODIUM-DEXTROSE 2-3 GM-% IV SOLR: 2.0000 g | INTRAVENOUS | Status: DC

## 2013-06-02 MED ORDER — METHOCARBAMOL 500 MG PO TABS
500.0000 mg | ORAL_TABLET | Freq: Four times a day (QID) | ORAL | Status: DC | PRN
  Administered 2013-06-03 – 2013-06-04 (×4): 500 mg via ORAL
  Filled 2013-06-02 (×4): qty 1

## 2013-06-02 MED ORDER — BUPIVACAINE IN DEXTROSE 0.75-8.25 % IT SOLN
INTRATHECAL | Status: DC | PRN
  Administered 2013-06-02: 2 mL via INTRATHECAL

## 2013-06-02 MED ORDER — SODIUM CHLORIDE 0.9 % IV SOLN
INTRAVENOUS | Status: DC
  Administered 2013-06-02: 20:00:00 via INTRAVENOUS

## 2013-06-02 MED ORDER — MENTHOL 3 MG MT LOZG
1.0000 | LOZENGE | OROMUCOSAL | Status: DC | PRN
  Filled 2013-06-02: qty 9

## 2013-06-02 MED ORDER — TIZANIDINE HCL 4 MG PO TABS
4.0000 mg | ORAL_TABLET | Freq: Four times a day (QID) | ORAL | Status: DC | PRN
  Filled 2013-06-02: qty 1

## 2013-06-02 MED ORDER — FENTANYL CITRATE 0.05 MG/ML IJ SOLN
INTRAMUSCULAR | Status: DC | PRN
  Administered 2013-06-02: 50 ug via INTRAVENOUS

## 2013-06-02 MED ORDER — RIVAROXABAN 10 MG PO TABS
10.0000 mg | ORAL_TABLET | Freq: Every day | ORAL | Status: DC
  Administered 2013-06-03 – 2013-06-04 (×2): 10 mg via ORAL
  Filled 2013-06-02 (×3): qty 1

## 2013-06-02 MED ORDER — MIDAZOLAM HCL 5 MG/5ML IJ SOLN
INTRAMUSCULAR | Status: DC | PRN
  Administered 2013-06-02: 2 mg via INTRAVENOUS

## 2013-06-02 MED ORDER — HYDROCODONE-ACETAMINOPHEN 5-325 MG PO TABS
1.0000 | ORAL_TABLET | ORAL | Status: DC | PRN
  Administered 2013-06-02 – 2013-06-03 (×4): 2 via ORAL
  Filled 2013-06-02 (×4): qty 2

## 2013-06-02 MED ORDER — HYDROMORPHONE HCL PF 1 MG/ML IJ SOLN: 0.2500 mg | INTRAMUSCULAR | Status: DC | PRN

## 2013-06-02 MED ORDER — LACTATED RINGERS IV SOLN
INTRAVENOUS | Status: DC | PRN
  Administered 2013-06-02: 18:00:00
  Administered 2013-06-02: 14:00:00 via INTRAVENOUS

## 2013-06-02 MED ORDER — TRAMADOL HCL 50 MG PO TABS: 50.0000 mg | ORAL_TABLET | Freq: Four times a day (QID) | ORAL | Status: DC | PRN

## 2013-06-02 MED ORDER — DIPHENHYDRAMINE HCL 12.5 MG/5ML PO ELIX: 12.5000 mg | ORAL_SOLUTION | ORAL | Status: DC | PRN

## 2013-06-02 MED ORDER — ONDANSETRON HCL 4 MG PO TABS: 4.0000 mg | ORAL_TABLET | Freq: Four times a day (QID) | ORAL | Status: DC | PRN

## 2013-06-02 MED ORDER — KETOROLAC TROMETHAMINE 15 MG/ML IJ SOLN
7.5000 mg | Freq: Four times a day (QID) | INTRAMUSCULAR | Status: AC | PRN
  Administered 2013-06-02: 7.5 mg via INTRAVENOUS
  Filled 2013-06-02: qty 1

## 2013-06-02 MED ORDER — PROPOFOL INFUSION 10 MG/ML OPTIME
INTRAVENOUS | Status: DC | PRN
  Administered 2013-06-02: 50 ug/kg/min via INTRAVENOUS

## 2013-06-02 MED ORDER — CARVEDILOL 25 MG PO TABS
25.0000 mg | ORAL_TABLET | Freq: Two times a day (BID) | ORAL | Status: DC
  Administered 2013-06-03 – 2013-06-04 (×3): 25 mg via ORAL
  Filled 2013-06-02 (×5): qty 1

## 2013-06-02 MED ORDER — ONDANSETRON HCL 4 MG/2ML IJ SOLN
INTRAMUSCULAR | Status: DC | PRN
  Administered 2013-06-02: 4 mg via INTRAVENOUS

## 2013-06-02 MED ORDER — PROPOFOL 10 MG/ML IV BOLUS
INTRAVENOUS | Status: AC
  Filled 2013-06-02: qty 20

## 2013-06-02 MED ORDER — ACETAMINOPHEN 500 MG PO TABS: 1000.0000 mg | ORAL_TABLET | Freq: Four times a day (QID) | ORAL | Status: DC

## 2013-06-02 MED ORDER — SODIUM CHLORIDE 0.9 % IJ SOLN
INTRAMUSCULAR | Status: DC | PRN
  Administered 2013-06-02: 30 mL

## 2013-06-02 MED ORDER — BUPIVACAINE HCL (PF) 0.25 % IJ SOLN
INTRAMUSCULAR | Status: AC
  Filled 2013-06-02: qty 30

## 2013-06-02 MED ORDER — FENTANYL CITRATE 0.05 MG/ML IJ SOLN
50.0000 ug | INTRAMUSCULAR | Status: DC | PRN
  Administered 2013-06-02 (×2): 50 ug via INTRAVENOUS

## 2013-06-02 MED ORDER — SODIUM CHLORIDE 0.9 % IV SOLN: INTRAVENOUS | Status: DC

## 2013-06-02 MED ORDER — METOCLOPRAMIDE HCL 5 MG/ML IJ SOLN: 5.0000 mg | Freq: Three times a day (TID) | INTRAMUSCULAR | Status: DC | PRN

## 2013-06-02 MED ORDER — DEXAMETHASONE SODIUM PHOSPHATE 10 MG/ML IJ SOLN
INTRAMUSCULAR | Status: AC
  Filled 2013-06-02: qty 1

## 2013-06-02 MED ORDER — METOCLOPRAMIDE HCL 10 MG PO TABS: 5.0000 mg | ORAL_TABLET | Freq: Three times a day (TID) | ORAL | Status: DC | PRN

## 2013-06-02 MED ORDER — MIDAZOLAM HCL 2 MG/2ML IJ SOLN
INTRAMUSCULAR | Status: AC
  Filled 2013-06-02: qty 2

## 2013-06-02 MED ORDER — PROMETHAZINE HCL 25 MG/ML IJ SOLN: 6.2500 mg | INTRAMUSCULAR | Status: DC | PRN

## 2013-06-02 MED ORDER — BUPIVACAINE HCL 0.25 % IJ SOLN
INTRAMUSCULAR | Status: DC | PRN
  Administered 2013-06-02: 20 mL

## 2013-06-02 MED ORDER — VANCOMYCIN HCL IN DEXTROSE 1-5 GM/200ML-% IV SOLN
INTRAVENOUS | Status: AC
  Filled 2013-06-02: qty 200

## 2013-06-02 MED ORDER — SODIUM CHLORIDE 0.9 % IJ SOLN
INTRAMUSCULAR | Status: AC
  Filled 2013-06-02: qty 50

## 2013-06-02 MED ORDER — BUPIVACAINE LIPOSOME 1.3 % IJ SUSP
INTRAMUSCULAR | Status: DC | PRN
  Administered 2013-06-02: 20 mL

## 2013-06-02 MED ORDER — PANTOPRAZOLE SODIUM 40 MG PO TBEC
40.0000 mg | DELAYED_RELEASE_TABLET | Freq: Two times a day (BID) | ORAL | Status: DC
  Administered 2013-06-03: 40 mg via ORAL
  Filled 2013-06-02 (×3): qty 1

## 2013-06-02 MED ORDER — ONDANSETRON HCL 4 MG/2ML IJ SOLN
INTRAMUSCULAR | Status: AC
  Filled 2013-06-02: qty 2

## 2013-06-02 MED ORDER — DOCUSATE SODIUM 100 MG PO CAPS
100.0000 mg | ORAL_CAPSULE | Freq: Two times a day (BID) | ORAL | Status: DC
  Administered 2013-06-02 – 2013-06-04 (×4): 100 mg via ORAL

## 2013-06-02 MED ORDER — VANCOMYCIN HCL 10 G IV SOLR
1500.0000 mg | Freq: Once | INTRAVENOUS | Status: AC
  Administered 2013-06-02: 1500 mg via INTRAVENOUS
  Filled 2013-06-02: qty 1500

## 2013-06-02 MED ORDER — DEXTROSE 5 % IV SOLN
3.0000 g | Freq: Once | INTRAVENOUS | Status: AC
  Administered 2013-06-02: 3 g via INTRAVENOUS
  Filled 2013-06-02: qty 3000

## 2013-06-02 MED ORDER — BISACODYL 10 MG RE SUPP: 10.0000 mg | Freq: Every day | RECTAL | Status: DC | PRN

## 2013-06-02 MED ORDER — PHENOL 1.4 % MT LIQD: 1.0000 | OROMUCOSAL | Status: DC | PRN

## 2013-06-02 MED ORDER — TRANEXAMIC ACID 100 MG/ML IV SOLN
1000.0000 mg | INTRAVENOUS | Status: DC
  Filled 2013-06-02: qty 10

## 2013-06-02 MED ORDER — ONDANSETRON HCL 4 MG/2ML IJ SOLN: 4.0000 mg | Freq: Four times a day (QID) | INTRAMUSCULAR | Status: DC | PRN

## 2013-06-02 MED ORDER — FENTANYL CITRATE 0.05 MG/ML IJ SOLN
INTRAMUSCULAR | Status: AC
  Filled 2013-06-02: qty 2

## 2013-06-02 MED ORDER — INSULIN ASPART 100 UNIT/ML ~~LOC~~ SOLN
SUBCUTANEOUS | Status: DC | PRN
  Administered 2013-06-02 (×2): 5 [IU] via SUBCUTANEOUS

## 2013-06-02 MED ORDER — DEXAMETHASONE SODIUM PHOSPHATE 10 MG/ML IJ SOLN
10.0000 mg | Freq: Once | INTRAMUSCULAR | Status: AC
  Administered 2013-06-02: 10 mg via INTRAVENOUS

## 2013-06-02 MED ORDER — CHLORHEXIDINE GLUCONATE 4 % EX LIQD: 60.0000 mL | Freq: Once | CUTANEOUS | Status: DC

## 2013-06-02 MED ORDER — ACETAMINOPHEN 650 MG RE SUPP: 650.0000 mg | Freq: Four times a day (QID) | RECTAL | Status: DC | PRN

## 2013-06-02 MED ORDER — INSULIN ASPART 100 UNIT/ML ~~LOC~~ SOLN
4.0000 [IU] | Freq: Once | SUBCUTANEOUS | Status: AC
  Administered 2013-06-02: 15:00:00 via SUBCUTANEOUS

## 2013-06-02 MED ORDER — CEFAZOLIN SODIUM-DEXTROSE 2-3 GM-% IV SOLR
2.0000 g | Freq: Four times a day (QID) | INTRAVENOUS | Status: AC
  Administered 2013-06-02 – 2013-06-03 (×2): 2 g via INTRAVENOUS
  Filled 2013-06-02 (×2): qty 50

## 2013-06-02 MED ORDER — GLIMEPIRIDE 2 MG PO TABS
2.0000 mg | ORAL_TABLET | Freq: Every day | ORAL | Status: DC
  Administered 2013-06-03 – 2013-06-04 (×2): 2 mg via ORAL
  Filled 2013-06-02 (×3): qty 1

## 2013-06-02 MED ORDER — SODIUM CHLORIDE 0.9 % IR SOLN
Status: DC | PRN
  Administered 2013-06-02: 1000 mL

## 2013-06-02 MED ORDER — ACETAMINOPHEN 325 MG PO TABS: 650.0000 mg | ORAL_TABLET | Freq: Four times a day (QID) | ORAL | Status: DC | PRN

## 2013-06-02 MED ORDER — POLYETHYLENE GLYCOL 3350 17 G PO PACK: 17.0000 g | PACK | Freq: Every day | ORAL | Status: DC | PRN

## 2013-06-02 MED ORDER — INSULIN ASPART 100 UNIT/ML ~~LOC~~ SOLN
SUBCUTANEOUS | Status: AC
  Filled 2013-06-02: qty 1

## 2013-06-02 MED ORDER — SIMVASTATIN 20 MG PO TABS
20.0000 mg | ORAL_TABLET | Freq: Every day | ORAL | Status: DC
  Administered 2013-06-02 – 2013-06-03 (×2): 20 mg via ORAL
  Filled 2013-06-02 (×3): qty 1

## 2013-06-02 SURGICAL SUPPLY — 67 items
BAG SPEC THK2 15X12 ZIP CLS (MISCELLANEOUS)
BAG ZIPLOCK 12X15 (MISCELLANEOUS) ×1 IMPLANT
BANDAGE ELASTIC 6 VELCRO ST LF (GAUZE/BANDAGES/DRESSINGS) ×2 IMPLANT
BANDAGE ESMARK 6X9 LF (GAUZE/BANDAGES/DRESSINGS) ×1 IMPLANT
BLADE SAG 18X100X1.27 (BLADE) ×2 IMPLANT
BLADE SAW SGTL 11.0X1.19X90.0M (BLADE) ×2 IMPLANT
BNDG CMPR 9X6 STRL LF SNTH (GAUZE/BANDAGES/DRESSINGS) ×1
BNDG ESMARK 6X9 LF (GAUZE/BANDAGES/DRESSINGS) ×2
BOWL SMART MIX CTS (DISPOSABLE) ×2 IMPLANT
CAPT RP KNEE ×1 IMPLANT
CEMENT HV SMART SET (Cement) ×4 IMPLANT
CUFF TOURN SGL QUICK 34 (TOURNIQUET CUFF) ×2
CUFF TRNQT CYL 34X4X40X1 (TOURNIQUET CUFF) ×1 IMPLANT
DECANTER SPIKE VIAL GLASS SM (MISCELLANEOUS) ×2 IMPLANT
DRAPE EXTREMITY T 121X128X90 (DRAPE) ×2 IMPLANT
DRAPE POUCH INSTRU U-SHP 10X18 (DRAPES) ×2 IMPLANT
DRAPE U-SHAPE 47X51 STRL (DRAPES) ×2 IMPLANT
DRSG ADAPTIC 3X8 NADH LF (GAUZE/BANDAGES/DRESSINGS) ×2 IMPLANT
DRSG PAD ABDOMINAL 8X10 ST (GAUZE/BANDAGES/DRESSINGS) ×2 IMPLANT
DURAPREP 26ML APPLICATOR (WOUND CARE) ×2 IMPLANT
ELECT REM PT RETURN 9FT ADLT (ELECTROSURGICAL) ×2
ELECTRODE REM PT RTRN 9FT ADLT (ELECTROSURGICAL) ×1 IMPLANT
EVACUATOR 1/8 PVC DRAIN (DRAIN) ×2 IMPLANT
FACESHIELD WRAPAROUND (MASK) ×10 IMPLANT
FACESHIELD WRAPAROUND OR TEAM (MASK) ×5 IMPLANT
GLOVE BIO SURGEON STRL SZ7.5 (GLOVE) ×1 IMPLANT
GLOVE BIO SURGEON STRL SZ8 (GLOVE) ×2 IMPLANT
GLOVE BIOGEL PI IND STRL 7.0 (GLOVE) IMPLANT
GLOVE BIOGEL PI IND STRL 7.5 (GLOVE) IMPLANT
GLOVE BIOGEL PI IND STRL 8 (GLOVE) ×2 IMPLANT
GLOVE BIOGEL PI INDICATOR 7.0 (GLOVE) ×2
GLOVE BIOGEL PI INDICATOR 7.5 (GLOVE) ×1
GLOVE BIOGEL PI INDICATOR 8 (GLOVE) ×2
GLOVE SURG SS PI 6.5 STRL IVOR (GLOVE) ×1 IMPLANT
GLOVE SURG SS PI 7.0 STRL IVOR (GLOVE) ×2 IMPLANT
GOWN STRL REUS W/ TWL LRG LVL4 (GOWN DISPOSABLE) IMPLANT
GOWN STRL REUS W/TWL 2XL LVL3 (GOWN DISPOSABLE) ×1 IMPLANT
GOWN STRL REUS W/TWL LRG LVL3 (GOWN DISPOSABLE) ×2 IMPLANT
GOWN STRL REUS W/TWL LRG LVL4 (GOWN DISPOSABLE) ×2
GOWN STRL REUS W/TWL XL LVL3 (GOWN DISPOSABLE) ×2 IMPLANT
HANDPIECE INTERPULSE COAX TIP (DISPOSABLE) ×2
IMMOBILIZER KNEE 20 (SOFTGOODS) ×2 IMPLANT
IV NS 1000ML (IV SOLUTION) ×2
IV NS 1000ML BAXH (IV SOLUTION) IMPLANT
KIT BASIN OR (CUSTOM PROCEDURE TRAY) ×2 IMPLANT
MANIFOLD NEPTUNE II (INSTRUMENTS) ×2 IMPLANT
NDL SAFETY ECLIPSE 18X1.5 (NEEDLE) ×2 IMPLANT
NEEDLE HYPO 18GX1.5 SHARP (NEEDLE) ×4
NS IRRIG 1000ML POUR BTL (IV SOLUTION) ×2 IMPLANT
PACK TOTAL JOINT (CUSTOM PROCEDURE TRAY) ×2 IMPLANT
PADDING CAST COTTON 6X4 STRL (CAST SUPPLIES) ×2 IMPLANT
POSITIONER SURGICAL ARM (MISCELLANEOUS) ×2 IMPLANT
SET HNDPC FAN SPRY TIP SCT (DISPOSABLE) ×1 IMPLANT
SPONGE GAUZE 4X4 12PLY (GAUZE/BANDAGES/DRESSINGS) ×2 IMPLANT
STRIP CLOSURE SKIN 1/2X4 (GAUZE/BANDAGES/DRESSINGS) ×4 IMPLANT
SUCTION FRAZIER 12FR DISP (SUCTIONS) ×2 IMPLANT
SUT MNCRL AB 4-0 PS2 18 (SUTURE) ×2 IMPLANT
SUT VIC AB 2-0 CT1 27 (SUTURE) ×6
SUT VIC AB 2-0 CT1 TAPERPNT 27 (SUTURE) ×3 IMPLANT
SUT VLOC 180 0 24IN GS25 (SUTURE) ×2 IMPLANT
SYR 20CC LL (SYRINGE) ×2 IMPLANT
SYR 50ML LL SCALE MARK (SYRINGE) ×2 IMPLANT
TOWEL OR 17X26 10 PK STRL BLUE (TOWEL DISPOSABLE) ×2 IMPLANT
TOWEL OR NON WOVEN STRL DISP B (DISPOSABLE) ×1 IMPLANT
TRAY FOLEY CATH 14FRSI W/METER (CATHETERS) ×2 IMPLANT
WATER STERILE IRR 1500ML POUR (IV SOLUTION) ×2 IMPLANT
WRAP KNEE MAXI GEL POST OP (GAUZE/BANDAGES/DRESSINGS) ×2 IMPLANT

## 2013-06-02 NOTE — Progress Notes (Signed)
Attempted to page Anesthesia with CBG results and inform them of pt refusing to take his carvedilol on empty stomach. VSS,  Dr. Wynelle Link aware.  No returned phone call from anesthesia.

## 2013-06-02 NOTE — Transfer of Care (Signed)
Immediate Anesthesia Transfer of Care Note  Patient: Derek [name]  Procedure(s) Performed: Procedure(s) (LRB): RIGHT TOTAL KNEE ARTHROPLASTY (Right)  Patient Location: PACU  Anesthesia Type: Spinal  Level of Consciousness: sedated, patient cooperative and responds to stimulation  Airway & Oxygen Therapy: Patient Spontanous Breathing and Patient connected to face mask oxgen  Post-op Assessment: Report given to PACU RN and Post -op Vital signs reviewed and stable  Post vital signs: Reviewed and stable  Complications: No apparent anesthesia complications

## 2013-06-02 NOTE — H&P (View-Only) (Signed)
Derek [name] DOB: 06/09/1945 Married / Language: English / Race: White Male  Date of Admission:  06-02-2013  Chief Complaint:  Right Knee Pain  History of Present Illness The patient is a 68 year old male who comes in for a preoperative History and Physical. The patient is scheduled for a right total knee arthroplasty to be performed by Dr. Frank V. Aluisio, MD at [facility] on 06-02-2013. The patient is a 68 year old male who presents for follow up of their knee. The patient is being followed for their bilateral knee pain and osteoarthritis. They are now 5 year(s) out from left total knee (and 1 year out from arthroscopic synovectomy). Symptoms reported today include: pain, aching, catching, instability and difficulty ambulating. The patient feels that they are doing poorly and report their pain level to be moderate. The following medication has been used for pain control: none. He has had multiple cortisone injections as well as visco in the right knee, these were several years ago. He feels like the left knee is doing fine. The right knee is giving him a lot of difficulty. It is hurting him at all times. It is limiting what he can and can not do. It is hurting as bad as the left one did prior to when he had the left one replaced. He said he feels like he might be getting a little scar tissue again in the left knee but it is not bothering him to the point where he needs to do anything. He is ready to get the right knee fixed. They have been treated conservatively in the past for the above stated problem and despite conservative measures, they continue to have progressive pain and severe functional limitations and dysfunction. They have failed non-operative management including home exercise, medications, and injections. It is felt that they would benefit from undergoing total joint replacement. Risks and benefits of the procedure have been discussed with the patient and they  elect to proceed with surgery. There are no active contraindications to surgery such as ongoing infection or rapidly progressive neurological disease.    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 09/04/2003 MULTIPLE SENSITIVITIES TO OPIOIDS    Family History Heart Disease. grandfather mothers side grandmother fathers side and grandfather fathers side Diabetes Mellitus. grandmother mothers side Cancer. First Degree Relatives. father    Social History Exercise. Exercises never Drug/Alcohol Rehab (Previously). no Living situation. live with spouse Illicit drug use. no Drug/Alcohol Rehab (Currently). no Alcohol use. former drinker Previously in rehab. no Current work status. retired disabled Children. 2 Marital status. married Number of flights of stairs before winded. 1 less than 1 Pain Contract. yes Tobacco use. Former smoker. former smoker; smoke(d) 3 or more pack(s) per day former smoker; smoke(d) 1 1/2 pack(s) per day Tobacco / smoke exposure. no    Medication History Zanaflex (4MG Tablet, 1 (one) Tablet Oral every eight hours, as needed for spasm, Taken starting 05/09/2013) Active. Aspirin EC (81MG Tablet DR, Oral) Active. Losartan Potassium (50MG Tablet, Oral) Active. Glimepiride (2MG Tablet, Oral) Active. Carvedilol (25MG Tablet, Oral) Active. MetFORMIN HCl ER (500MG Tablet ER 24HR, Oral) Active. Simvastatin (20MG Tablet, Oral) Active. Lansoprazole (30MG Capsule DR, Oral) Active. Magnesium Gluconate (500MG Tablet, Oral) Inactive.    Past Surgical History Inguinal Hernia Repair. open: bilateral open: bilateral and multiple times Foot Surgery. left Rotator Cuff Repair. bilateral Total Knee Replacement. left Ankle   Surgery. left Arthroscopy of Knee. 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 Enlarged Depression High blood pressure History of Postop Urinary Retention    Review of Systems General:Not Present- Chills, Fever, Night Sweats, Fatigue, Weight Gain, Weight Loss and Memory Loss. Skin:Not Present- Hives, Itching, Rash, Eczema and Lesions. HEENT:Not Present- Tinnitus, Headache, Double Vision, Visual Loss, Hearing Loss and Dentures. Respiratory:Not Present- Shortness of breath with exertion, Shortness of breath at rest, Allergies, Coughing up blood and Chronic Cough. Cardiovascular:Not Present- Chest Pain, Racing/skipping heartbeats, Difficulty Breathing Lying Down, Murmur, Swelling and Palpitations. Gastrointestinal:Not Present- Bloody Stool, Heartburn, Abdominal Pain, Vomiting, Nausea, Constipation, Diarrhea, Difficulty Swallowing, Jaundice and Loss of appetitie. Male Genitourinary:Not Present- Urinary frequency, Blood in Urine, Weak urinary stream, Discharge, Flank Pain, Incontinence, Painful Urination, Urgency, Urinary Retention and Urinating at Night. Musculoskeletal:Not Present- Muscle Weakness, Muscle Pain, Joint Swelling, Joint Pain, Back Pain, Morning Stiffness and Spasms. Neurological:Not Present- Tremor, Dizziness, Blackout spells, Paralysis, Difficulty with balance and Weakness. Psychiatric:Not Present- Insomnia.    Vitals Pulse: 76 (Regular) Resp.: 14 (Unlabored) BP: 126/78 (Sitting, Left Arm, Standard)   Physical Exam The physical exam findings are as follows:   General Mental Status - Alert, cooperative and good historian. General Appearance- pleasant. Not in acute distress. Orientation- Oriented X3. Build & Nutrition- Well nourished and Well developed.   Head and Neck Head- normocephalic, atraumatic . Neck Global Assessment- supple. no bruit auscultated  on the right and no bruit auscultated on the left.   Eye Pupil- Bilateral- Regular and Round. Motion- Bilateral- EOMI.   ENMT lower partial denture plate  Chest and Lung Exam Auscultation: Breath sounds:- clear at anterior chest wall and - clear at posterior chest wall. Adventitious sounds:- No Adventitious sounds.   Cardiovascular Auscultation:Rhythm- Regular rate and rhythm. Heart Sounds- S1 WNL and S2 WNL. Murmurs & Other Heart Sounds:Auscultation of the heart reveals - No Murmurs.   Abdomen Inspection:Contour- Generalized mild distention. Palpation/Percussion:Tenderness- Abdomen is non-tender to palpation. Rigidity (guarding)- Abdomen is soft. Auscultation:Auscultation of the abdomen reveals - Bowel sounds normal.   Male Genitourinary Not done, not pertinent to present illness  Musculoskeletal On exam he is alert and oriented in no apparent distress. Evaluation of his left knee shows no swelling. Range about 0 to 125 with on crepitus on range of motion. No tenderness or instability. His right knee shows varus deformity. Range about 5 to 120. Marked crepitus on range of motion. He has tenderness medial greater than lateral with no instability noted. Pulses, sensation and motor are intact.  RADIOGRAPHS: AP both knees and lateral show prosthesis on the left is in excellent position with no periprosthetic abnormalities. On the right he has significant bone on bone arthritis medial and patellofemoral compartments.   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 Aluisio.  Plan is to go home.  PCP - Dr. Ross Cardiologist - Dr. Mark Skains Urology - Dr. Woodruff  The patient does not have any contraindications and will receive TXA (tranexamic acid) prior to surgery.   Signed electronically by  Corri Delapaz L Velma Agnes, III PA-C  

## 2013-06-02 NOTE — Anesthesia Procedure Notes (Signed)
Spinal  Patient location during procedure: OR End time: 06/02/2013 4:19 PM Staffing Anesthesiologist: Salley Scarlet CRNA/Resident: Lajuana Carry E Performed by: resident/CRNA  Preanesthetic Checklist Completed: patient identified, site marked, surgical consent, pre-op evaluation, timeout performed, IV checked, risks and benefits discussed and monitors and equipment checked Spinal Block Patient position: sitting Prep: Betadine Patient monitoring: heart rate, continuous pulse ox and blood pressure Approach: midline Location: L3-4 Injection technique: single-shot Needle Needle type: Sprotte  Needle gauge: 24 G Needle length: 9 cm Assessment Sensory level: T8 Additional Notes Kit expiration checked, sterile prep and drape. Clear CSF first attempt, neg heme/neg paresthesia. tol well, return to supine

## 2013-06-02 NOTE — Anesthesia Postprocedure Evaluation (Signed)
  Anesthesia Post-op Note  Patient: Derek [name]  Procedure(s) Performed: Procedure(s) (LRB): RIGHT TOTAL KNEE ARTHROPLASTY (Right)  Patient Location: PACU  Anesthesia Type: spinal  Level of Consciousness: awake and alert   Airway and Oxygen Therapy: Patient Spontanous Breathing  Post-op Pain: mild  Post-op Assessment: Post-op Vital signs reviewed, Patient's Cardiovascular Status Stable, Respiratory Function Stable, Patent Airway and No signs of Nausea or vomiting  Last Vitals:  Filed Vitals:   06/02/13 1938  BP:   Pulse: 63  Temp: 36.4 C  Resp: 16    Post-op Vital Signs: stable   Complications: No apparent anesthesia complications

## 2013-06-02 NOTE — Op Note (Signed)
Pre-operative diagnosis- Osteoarthritis  Right knee(s)  Post-operative diagnosis- Osteoarthritis Right knee(s)  Procedure-  Right  Total Knee Arthroplasty  Surgeon- Dione Plover. [name], MD  Assistant- Arlee Muslim, PA-C   Anesthesia-  Spinal EBL-* No blood loss amount entered *  Drains Hemovac  Tourniquet time-  Total Tourniquet Time Documented: Thigh (Right) - 33 minutes Total: Thigh (Right) - 33 minutes    Complications- None  Condition-PACU - hemodynamically stable.   Brief Clinical Note  Derek [name] is a 68 y.o. year old male with end stage OA of his right knee with progressively worsening pain and dysfunction. He has constant pain, with activity and at rest and significant functional deficits with difficulties even with ADLs. He has had extensive non-op management including analgesics, injections of cortisone and viscosupplements, and home exercise program, but remains in significant pain with significant dysfunction. Radiographs show bone on bone arthritis medial and patellofemoral. He presents now for right Total Knee Arthroplasty.    Procedure in detail---   The patient is brought into the operating room and positioned supine on the operating table. After successful administration of  Spinal,   a tourniquet is placed high on the  Right thigh(s) and the lower extremity is prepped and draped in the usual sterile fashion. Time out is performed by the operating team and then the  Right lower extremity is wrapped in Esmarch, knee flexed and the tourniquet inflated to 300 mmHg.       A midline incision is made with a ten blade through the subcutaneous tissue to the level of the extensor mechanism. A fresh blade is used to make a medial parapatellar arthrotomy. Soft tissue over the proximal medial tibia is subperiosteally elevated to the joint line with a knife and into the semimembranosus bursa with a Cobb elevator. Soft tissue over the proximal lateral tibia is elevated with  attention being paid to avoiding the patellar tendon on the tibial tubercle. The patella is everted, knee flexed 90 degrees and the ACL and PCL are removed. Findings are bone on bone medial and patellofemoral with large global osteophytes.        The drill is used to create a starting hole in the distal femur and the canal is thoroughly irrigated with sterile saline to remove the fatty contents. The 5 degree Right  valgus alignment guide is placed into the femoral canal and the distal femoral cutting block is pinned to remove 10 mm off the distal femur. Resection is made with an oscillating saw.      The tibia is subluxed forward and the menisci are removed. The extramedullary alignment guide is placed referencing proximally at the medial aspect of the tibial tubercle and distally along the second metatarsal axis and tibial crest. The block is pinned to remove 54mm off the more deficient medial  side. Resection is made with an oscillating saw. Size 4is the most appropriate size for the tibia and the proximal tibia is prepared with the modular drill and keel punch for that size.      The femoral sizing guide is placed and size 5 is most appropriate. Rotation is marked off the epicondylar axis and confirmed by creating a rectangular flexion gap at 90 degrees. The size 5 cutting block is pinned in this rotation and the anterior, posterior and chamfer cuts are made with the oscillating saw. The intercondylar block is then placed and that cut is made.      Trial size 4 tibial component, trial size  5 posterior stabilized femur and a 12.5  mm posterior stabilized rotating platform insert trial is placed. Full extension is achieved with excellent varus/valgus and anterior/posterior balance throughout full range of motion. The patella is everted and thickness measured to be 22  mm. Free hand resection is taken to 12 mm, a 35 template is placed, lug holes are drilled, trial patella is placed, and it tracks normally.  Osteophytes are removed off the posterior femur with the trial in place. All trials are removed and the cut bone surfaces prepared with pulsatile lavage. Cement is mixed and once ready for implantation, the size 4 tibial implant, size  5 posterior stabilized femoral component, and the size 35 patella are cemented in place and the patella is held with the clamp. The trial insert is placed and the knee held in full extension. The Exparel (20 ml mixed with 30 ml saline) and .25% Bupivicaine, are injected into the extensor mechanism, posterior capsule, medial and lateral gutters and subcutaneous tissues.  All extruded cement is removed and once the cement is hard the permanent 12.5 mm posterior stabilized rotating platform insert is placed into the tibial tray.      The wound is copiously irrigated with saline solution and the extensor mechanism closed over a hemovac drain with #1 PDS suture. The tourniquet is released for a total tourniquet time of 34  minutes. Flexion against gravity is 140 degrees and the patella tracks normally. Subcutaneous tissue is closed with 2.0 vicryl and subcuticular with running 4.0 Monocryl. The incision is cleaned and dried and steri-strips and a bulky sterile dressing are applied. The limb is placed into a knee immobilizer and the patient is awakened and transported to recovery in stable condition.      Please note that a surgical assistant was a medical necessity for this procedure in order to perform it in a safe and expeditious manner. Surgical assistant was necessary to retract the ligaments and vital neurovascular structures to prevent injury to them and also necessary for proper positioning of the limb to allow for anatomic placement of the prosthesis.   Dione Plover Rosevelt Luu, MD    06/02/2013, 5:18 PM

## 2013-06-02 NOTE — Interval H&P Note (Signed)
History and Physical Interval Note:  06/02/2013 1:13 PM  Derek [name]  has presented today for surgery, with the diagnosis of Osteoarthritis Right Knee  The various methods of treatment have been discussed with the patient and family. After consideration of risks, benefits and other options for treatment, the patient has consented to  Procedure(s): RIGHT TOTAL KNEE ARTHROPLASTY (Right) as a surgical intervention .  The patient's history has been reviewed, patient examined, no change in status, stable for surgery.  I have reviewed the patient's chart and labs.  Questions were answered to the patient's satisfaction.     Dione Plover [name]

## 2013-06-02 NOTE — Anesthesia Preprocedure Evaluation (Addendum)
Anesthesia Evaluation  Patient identified by MRN, date of birth, ID band Patient awake    Reviewed: Allergy & Precautions, H&P , NPO status , Patient's Chart, lab work & pertinent test results  History of Anesthesia Complications (+) PONV and history of anesthetic complications  Airway Mallampati: II TM Distance: >3 FB Neck ROM: Full    Dental no notable dental hx.    Pulmonary neg pulmonary ROS, sleep apnea , former smoker,  breath sounds clear to auscultation  Pulmonary exam normal       Cardiovascular Exercise Tolerance: Good hypertension, Pt. on medications and Pt. on home beta blockers Rhythm:Regular Rate:Normal     Neuro/Psych PSYCHIATRIC DISORDERS Anxiety Depression negative neurological ROS  negative psych ROS   GI/Hepatic Neg liver ROS, GERD-  Medicated,  Endo/Other  diabetes, Type 2, Oral Hypoglycemic Agents  Renal/GU Renal disease  negative genitourinary   Musculoskeletal negative musculoskeletal ROS (+)   Abdominal (+) + obese,   Peds negative pediatric ROS (+)  Hematology negative hematology ROS (+)   Anesthesia Other Findings   Reproductive/Obstetrics negative OB ROS                          Anesthesia Physical Anesthesia Plan  ASA: III  Anesthesia Plan: Spinal   Post-op Pain Management:    Induction: Intravenous  Airway Management Planned:   Additional Equipment:   Intra-op Plan:   Post-operative Plan: Extubation in OR  Informed Consent: I have reviewed the patients History and Physical, chart, labs and discussed the procedure including the risks, benefits and alternatives for the proposed anesthesia with the patient or authorized representative who has indicated his/her understanding and acceptance.   Dental advisory given  Plan Discussed with: CRNA  Anesthesia Plan Comments: (Discussed general versus spinal. He had a spinal for his left TKA and prefers  spinal. Discussed risks/benefits of spinal including headache, backache, failure, bleeding, infection, and nerve damage. Patient consents to spinal. Questions answered. Coagulation studies and platelet count acceptable. No h/o bleeding. 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

## 2013-06-03 ENCOUNTER — Encounter (HOSPITAL_COMMUNITY): Payer: Self-pay | Admitting: Orthopedic Surgery

## 2013-06-03 DIAGNOSIS — E871 Hypo-osmolality and hyponatremia: Secondary | ICD-10-CM | POA: Diagnosis present

## 2013-06-03 LAB — GLUCOSE, CAPILLARY
Glucose-Capillary: 327 mg/dL — ABNORMAL HIGH (ref 70–99)
Glucose-Capillary: 272 mg/dL — ABNORMAL HIGH (ref 70–99)
Glucose-Capillary: 379 mg/dL — ABNORMAL HIGH (ref 70–99)
Glucose-Capillary: 297 mg/dL — ABNORMAL HIGH (ref 70–99)

## 2013-06-03 LAB — BASIC METABOLIC PANEL
BUN: 23 mg/dL (ref 6–23)
CO2: 21 mEq/L (ref 19–32)
Calcium: 9.8 mg/dL (ref 8.4–10.5)
Chloride: 97 mEq/L (ref 96–112)
Creatinine, Ser: 1.03 mg/dL (ref 0.50–1.35)
GFR calc Af Amer: 84 mL/min — ABNORMAL LOW (ref >90)
GFR calc non Af Amer: 73 mL/min — ABNORMAL LOW (ref >90)
Glucose, Bld: 415 mg/dL — ABNORMAL HIGH (ref 70–99)
Potassium: 5.3 mEq/L (ref 3.7–5.3)
Sodium: 131 mEq/L — ABNORMAL LOW (ref 137–147)

## 2013-06-03 LAB — CBC
HCT: 32.2 % — ABNORMAL LOW (ref 39.0–52.0)
Hemoglobin: 11.2 g/dL — ABNORMAL LOW (ref 13.0–17.0)
MCH: 27.7 pg (ref 26.0–34.0)
MCHC: 34.8 g/dL (ref 30.0–36.0)
MCV: 79.7 fL (ref 78.0–100.0)
Platelets: 153 10*3/uL (ref 150–400)
RBC: 4.04 MIL/uL — ABNORMAL LOW (ref 4.22–5.81)
RDW: 12.5 % (ref 11.5–15.5)
WBC: 5.6 10*3/uL (ref 4.0–10.5)

## 2013-06-03 MED ORDER — INSULIN GLARGINE 100 UNIT/ML ~~LOC~~ SOLN
20.0000 [IU] | Freq: Every day | SUBCUTANEOUS | Status: AC
  Administered 2013-06-03: 20 [IU] via SUBCUTANEOUS
  Filled 2013-06-03: qty 0.2

## 2013-06-03 MED ORDER — LANSOPRAZOLE 30 MG PO CPDR
30.0000 mg | DELAYED_RELEASE_CAPSULE | Freq: Every day | ORAL | Status: DC
  Administered 2013-06-03 – 2013-06-04 (×2): 30 mg via ORAL
  Filled 2013-06-03 (×3): qty 1

## 2013-06-03 MED ORDER — NON FORMULARY: 30.0000 mg | Freq: Every day | Status: DC

## 2013-06-03 MED ORDER — INSULIN ASPART 100 UNIT/ML ~~LOC~~ SOLN
10.0000 [IU] | Freq: Once | SUBCUTANEOUS | Status: AC
  Administered 2013-06-03: 10 [IU] via SUBCUTANEOUS

## 2013-06-03 MED ORDER — HYDROCODONE-ACETAMINOPHEN 7.5-325 MG PO TABS
1.0000 | ORAL_TABLET | ORAL | Status: DC | PRN
  Administered 2013-06-03 – 2013-06-04 (×4): 2 via ORAL
  Filled 2013-06-03 (×4): qty 2

## 2013-06-03 NOTE — Care Management Note (Signed)
    Page 1 of 1   06/03/2013     12:16:08 PM   CARE MANAGEMENT NOTE 06/03/2013  Patient:  Derek [name], Derek [name]   Account Number:  [id number]  Date Initiated:  06/03/2013  Documentation initiated by:  Sunday Spillers  Subjective/Objective Assessment:   68 yo male admitted s/p Right Total Knee Arthroplasty     Action/Plan:   Home when stable   Anticipated DC Date:  06/05/2013   Anticipated DC Plan:  [place]  CM consult      Choice offered to / List presented to:             Status of service:  Completed, signed off Medicare Important Message given?  NA - LOS <3 / Initial given by admissions (If response is "NO", the following Medicare IM given date fields will be blank) Date Medicare IM given:   Date Additional Medicare IM given:    Discharge Disposition:  [place]  Per UR Regulation:  Reviewed for med. necessity/level of care/duration of stay  If discussed at [place] of Stay Meetings, dates discussed:    Comments:  06-03-13 Sunday Spillers RN CM Confirmed patient had [place] arranged with Arville Go PTA.

## 2013-06-03 NOTE — Progress Notes (Signed)
   Subjective: 1 Day Post-Op Procedure(s) (LRB): RIGHT TOTAL KNEE ARTHROPLASTY (Right) Patient reports pain as mild.   Patient seen in rounds with Dr. Wynelle Link. Patient is well, and has had no acute complaints or problems We will start therapy today.  Plan is to go Home after hospital stay. Patient has a history of urinary retention.  His urine output noted in his foley has been excellent.  Will DC the foley today and monitor for continued output.  If unable to void, my need foley reinserted and converted to a leg bag prior to discharge.  Objective: Vital signs in last 24 hours: Temp:  [97.4 F (36.3 C)-97.9 F (36.6 C)] 97.9 F (36.6 C) (04/14 0554) Pulse Rate:  [63-95] 93 (04/14 0554) Resp:  [15-19] 17 (04/14 0554) BP: (94-146)/(52-88) 126/83 mmHg (04/14 0554) SpO2:  [95 %-100 %] 97 % (04/14 0554) Weight:  [117.935 kg (260 lb)] 117.935 kg (260 lb) (04/13 2020)  Intake/Output from previous day:  Intake/Output Summary (Last 24 hours) at 06/03/13 0819 Last data filed at 06/03/13 0600  Gross per 24 hour  Intake 1973.33 ml  Output   3160 ml  Net -1186.67 ml    Intake/Output this shift:    Labs:  Recent Labs  06/03/13 0413  HGB 11.2*    Recent Labs  06/03/13 0413  WBC 5.6  RBC 4.04*  HCT 32.2*  PLT 153    Recent Labs  06/03/13 0413  NA 131*  K 5.3  CL 97  CO2 21  BUN 23  CREATININE 1.03  GLUCOSE 415*  CALCIUM 9.8   No results found for this basename: LABPT, INR,  in the last 72 hours  EXAM General - Patient is Alert, Appropriate and Oriented Extremity - Neurovascular intact Sensation intact distally Dressing - dressing C/D/I Motor Function - intact, moving foot and toes well on exam.  Hemovac pulled without difficulty.  Past Medical History  Diagnosis Date  . Hypertension   . Diabetes mellitus   . GERD (gastroesophageal reflux disease)   . Meniere's disease   . Morbid obesity   . Depression   . Arthritis   . 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 Obstructive sleep apnea     pt states not now-did used to use one  . PONV (postoperative nausea and vomiting)   . Anxiety     prone to "panic attacks"  . 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 History of kidney stones     multiple kidney stones-not a problem at present  . Kidney stone   . Dermatitis     bilateral hands    Assessment/Plan: 1 Day Post-Op Procedure(s) (LRB): RIGHT TOTAL KNEE ARTHROPLASTY (Right) Principal Problem:   OA (osteoarthritis) of knee Active Problems:   GERD (gastroesophageal reflux disease)   Hypertension   Obstructive sleep apnea   Morbid obesity   Hyponatremia  Estimated body mass index is 34.31 kg/(m^2) as calculated from the following:   Height as of this encounter: 6\' 1"  (1.854 m).   Weight as of this encounter: 117.935 kg (260 lb). Advance diet Up with therapy Plan for discharge tomorrow Discharge home with home health  DVT Prophylaxis - Xarelto Weight-Bearing as tolerated to right leg D/C O2 and Pulse OX and try on Room Air  Ansley [place]wood [place] 06/03/2013, 8:19 AM

## 2013-06-03 NOTE — Progress Notes (Signed)
Inpatient Diabetes Program Recommendations  AACE/ADA: New Consensus Statement on Inpatient Glycemic Control (2013)  Target Ranges:  Prepandial:   less than 140 mg/dL      Peak postprandial:   less than 180 mg/dL (1-2 hours)      Critically ill patients:  140 - 180 mg/dL   Reason for Assessment:  Hyperglycemia  Diabetes history: Type 2 diabetes Outpatient Diabetes medications: Amaryl 2 mg daily, Metformin XR 1000 mg q am, 1500 mg q pm Current orders for Inpatient glycemic control: Novolog Moderate Correction, Amaryl 2 mg daily  Note:  Patient received decadron X 1 yesterday.  Lab glucose this am was 415 mg/dl.  CBG before breakfast was 327 mg/dl.  Patient for probably discharge home tomorrow.  Request physician consider the following:  Order a one-time dose of Lantus or Levemir 20 units today  Consider adding Novolog meal coverage 3 to 4 units tid today  Have patient follow-up with PCP as soon as possible after discharge. Thank you.  Derek [name] S. Derek Mates, RN, MSN, CDE Inpatient Diabetes Program, team pager [phone]   Addendum:  MD, please add a diagnosis of diabetes to the Hospital Problem List.

## 2013-06-03 NOTE — Progress Notes (Signed)
Utilization review completed.  

## 2013-06-03 NOTE — Progress Notes (Signed)
Patients blood glucose per BMET was 415.  Wyatt Portela PA paged, new order received.  Advised to give insulin as ordered and recheck CBG before breakfast and treat again as needed.  Will continue to monitor.

## 2013-06-03 NOTE — Progress Notes (Signed)
Physical Therapy Treatment Patient Details Name: Derek [name] MRN: [id number] DOB: August 21, 1945 Today's Date: 06/03/2013    History of Present Illness 68 yo male s/p R TKA 4/13. Hx of HTN, DM, Meniere's disease, obesity, anxiety    PT Comments    Progressing slowly with mobility. Limited by pain this session- 9/10 per pt.   Follow Up Recommendations  Home health PT;Supervision/Assistance - 24 hour     Equipment Recommendations  None recommended by PT    Recommendations for Other Services OT consult     Precautions / Restrictions Precautions Precautions: Knee;Fall Required Braces or Orthoses: Knee Immobilizer - Right Knee Immobilizer - Right: Discontinue once straight leg raise with < 10 degree lag Restrictions Weight Bearing Restrictions: No RLE Weight Bearing: Weight bearing as tolerated    Mobility  Bed Mobility Overal bed mobility: Needs Assistance Bed Mobility: Sit to Supine       Sit to supine: Min assist   General bed mobility comments: Assist for R LE onto bed. Increased time. VCs safety, technique, hand placement  Transfers Overall transfer level: Needs assistance Equipment used: Rolling walker (2 wheeled) Transfers: Sit to/from Stand Sit to Stand: Min assist         General transfer comment: Assist to rise, stabilize, control descent. VCs safety, technique, hand placement.   Ambulation/Gait Ambulation/Gait assistance: Min guard Ambulation Distance (Feet): 60 Feet Assistive device: Rolling walker (2 wheeled) Gait Pattern/deviations: Step-to pattern;Decreased stride length;Decreased step length - left;Decreased step length - right     General Gait Details: slow gait speed. VCs safety, technique, sequence. Very close guard for safety. Distance limited by [place] Rankin (Stroke Patients Only)       Balance                                    Cognition  Arousal/Alertness: Awake/alert Behavior During Therapy: WFL for tasks assessed/performed Overall Cognitive Status: Within Functional Limits for tasks assessed                      Exercises Total Joint Exercises Ankle Circles/Pumps: AROM;Both;10 reps;Supine Quad Sets: AROM;Both;10 reps;Supine Heel Slides: Both;10 reps;Supine;AAROM Hip ABduction/ADduction: AAROM;Both;10 reps;Supine Straight Leg Raises: AAROM;Both;10 reps;Supine Goniometric ROM: 10-35 degrees. Limited by pain    General Comments        Pertinent Vitals/Pain 9/10 R knee with activity. Ice applied end of session    Home Living                      Prior Function            PT Goals (current goals can now be found in the care plan section) Progress towards PT goals: Progressing toward goals    Frequency  7X/week    PT Plan Current plan remains appropriate    Co-evaluation             End of Session Equipment Utilized During Treatment: Gait belt Activity Tolerance: Patient limited by fatigue;Patient limited by pain Patient left: in bed;with call bell/phone within reach;with family/visitor present     Time: [id number] PT Time Calculation (min): 39 min  Charges:  $Gait Training: 23-37 mins $Therapeutic Exercise: 8-22 mins  G Codes:      Weston Anna, MPT Pager: [phone]

## 2013-06-03 NOTE — Evaluation (Signed)
Physical Therapy Evaluation Patient Details Name: Derek [name] MRN: [id number] DOB: September 21, 1945 Today's Date: 06/03/2013   History of Present Illness  68 yo male s/p R TKA 4/13. Hx of HTN, DM, Meniere's disease, obesity, anxiety  Clinical Impression  On eval, pt required Min assist for mobility-able to ambulate ~50 feet with walker. Anticipate pt will progress well during stay.    Follow Up Recommendations Home health PT;Supervision/Assistance - 24 hour (initially)    Equipment Recommendations  None recommended by PT    Recommendations for Other Services OT consult     Precautions / Restrictions Precautions Precautions: Fall;Knee Required Braces or Orthoses: Knee Immobilizer - Right Knee Immobilizer - Right: Discontinue once straight leg raise with < 10 degree lag Restrictions Weight Bearing Restrictions: No RLE Weight Bearing: Weight bearing as tolerated      Mobility  Bed Mobility Overal bed mobility: Needs Assistance Bed Mobility: Supine to Sit     Supine to sit: Min assist     General bed mobility comments: Assist for R LE off bed. Increased time. VCs safety, technique, hand placement  Transfers Overall transfer level: Needs assistance Equipment used: Rolling walker (2 wheeled) Transfers: Sit to/from Stand Sit to Stand: Min assist;From elevated surface         General transfer comment: Assist to rise, stabilize, control descent. VCs safety, technique, hand placement.   Ambulation/Gait Ambulation/Gait assistance: Min assist Ambulation Distance (Feet): 50 Feet Assistive device: Rolling walker (2 wheeled) Gait Pattern/deviations: Step-to pattern;Decreased step length - left;Decreased step length - right;Decreased stride length;Antalgic     General Gait Details: slow gait speed. VCs safety, technique, sequence. Assist to stabilize throughout ambulation.   Stairs            Wheelchair Mobility    Modified Rankin (Stroke Patients Only)        Balance                                             Pertinent Vitals/Pain 8/10 R knee with activity. Ice applied end of session    [name] expects to be discharged to:: Private residence Living Arrangements: Spouse/significant other Available Help at Discharge: Family Type of Home: House Home Access: Stairs to enter Entrance Stairs-Rails: Right;Left;Can reach both Entrance Stairs-Number of Steps: [place]: Two level;Able to live on main level with bedroom/bathroom Home Equipment: Gilford Rile - 2 wheels;Crutches      Prior Function Level of Independence: Independent               Hand Dominance        Extremity/Trunk Assessment   Upper Extremity Assessment: Defer to OT evaluation           Lower Extremity Assessment: RLE deficits/detail RLE Deficits / Details: hip flex 3-/5, hip abd/add 2/5, moves ankle well    Cervical / Trunk Assessment: Normal  Communication   Communication: No difficulties  Cognition Arousal/Alertness: Awake/alert Behavior During Therapy: WFL for tasks assessed/performed Overall Cognitive Status: Within Functional Limits for tasks assessed                      General Comments      Exercises        Assessment/Plan    PT Assessment Patient needs continued PT services  PT Diagnosis Difficulty walking;Generalized weakness;Acute pain;Abnormality of gait   PT Problem  List Decreased strength;Decreased range of motion;Decreased activity tolerance;Decreased mobility;Pain;Obesity;Decreased knowledge of use of DME;Decreased knowledge of precautions  PT Treatment Interventions DME instruction;Gait training;Stair training;Functional mobility training;Therapeutic activities;Therapeutic exercise;Patient/family education;Balance training   PT Goals (Current goals can be found in the Care Plan section) Acute Rehab PT Goals Patient Stated Goal: to regain independence PT Goal Formulation: With  patient Time For Goal Achievement: 06/10/13 Potential to Achieve Goals: Good    Frequency 7X/week   Barriers to discharge        Co-evaluation               End of Session Equipment Utilized During Treatment: Gait belt Activity Tolerance: Patient tolerated treatment well Patient left: in chair;with call bell/phone within reach           Time: 0901-0929 PT Time Calculation (min): 28 min   Charges:   PT Evaluation $Initial PT Evaluation Tier I: 1 Procedure PT Treatments $Gait Training: 8-22 mins $Therapeutic Activity: 8-22 mins   PT G Codes:          Derek [name], MPT Pager: [phone]

## 2013-06-04 LAB — BASIC METABOLIC PANEL
BUN: 23 mg/dL (ref 6–23)
CO2: 23 mEq/L (ref 19–32)
Calcium: 10 mg/dL (ref 8.4–10.5)
Chloride: 98 mEq/L (ref 96–112)
Creatinine, Ser: 0.88 mg/dL (ref 0.50–1.35)
GFR calc Af Amer: >90 mL/min (ref >90)
GFR calc non Af Amer: 86 mL/min — ABNORMAL LOW (ref >90)
Glucose, Bld: 235 mg/dL — ABNORMAL HIGH (ref 70–99)
Potassium: 4.5 mEq/L (ref 3.7–5.3)
Sodium: 131 mEq/L — ABNORMAL LOW (ref 137–147)

## 2013-06-04 LAB — CBC
HCT: 29.3 % — ABNORMAL LOW (ref 39.0–52.0)
Hemoglobin: 10.3 g/dL — ABNORMAL LOW (ref 13.0–17.0)
MCH: 28.1 pg (ref 26.0–34.0)
MCHC: 35.2 g/dL (ref 30.0–36.0)
MCV: 79.8 fL (ref 78.0–100.0)
Platelets: 183 10*3/uL (ref 150–400)
RBC: 3.67 MIL/uL — ABNORMAL LOW (ref 4.22–5.81)
RDW: 12.7 % (ref 11.5–15.5)
WBC: 6.5 10*3/uL (ref 4.0–10.5)

## 2013-06-04 LAB — GLUCOSE, CAPILLARY
Glucose-Capillary: 270 mg/dL — ABNORMAL HIGH (ref 70–99)
Glucose-Capillary: 328 mg/dL — ABNORMAL HIGH (ref 70–99)

## 2013-06-04 MED ORDER — CYCLOBENZAPRINE HCL 10 MG PO TABS: 10.0000 mg | ORAL_TABLET | Freq: Three times a day (TID) | ORAL | Status: DC | PRN

## 2013-06-04 MED ORDER — TRAMADOL HCL 50 MG PO TABS: 50.0000 mg | ORAL_TABLET | Freq: Four times a day (QID) | ORAL | Status: DC | PRN

## 2013-06-04 MED ORDER — HYDROCODONE-ACETAMINOPHEN 7.5-325 MG PO TABS: 1.0000 | ORAL_TABLET | ORAL | Status: DC | PRN

## 2013-06-04 MED ORDER — RIVAROXABAN 10 MG PO TABS: 10.0000 mg | ORAL_TABLET | Freq: Every day | ORAL | Status: DC

## 2013-06-04 NOTE — Progress Notes (Signed)
Physical Therapy Treatment Patient Details Name: Derek [name] MRN: [id number] DOB: [birth year] Today's Date: 06/04/2013    History of Present Illness 68 yo male s/p R TKA 4/13. Hx of HTN, DM, Meniere's disease, obesity, anxiety    PT Comments    Progressing with mobility. Pt continues to c/o 8-9/10 pain levels R knee with activity however he still wants to d/c home today. Practiced all tasks in 1st session and will see for a 2nd session prior to d/c.   Follow Up Recommendations  Home health PT;Supervision/Assistance - 24 hour     Equipment Recommendations  None recommended by PT    Recommendations for Other Services OT consult     Precautions / Restrictions Precautions Precautions: Knee;Fall Required Braces or Orthoses: Knee Immobilizer - Right Knee Immobilizer - Right: Discontinue once straight leg raise with < 10 degree lag Restrictions Weight Bearing Restrictions: No RLE Weight Bearing: Weight bearing as tolerated    Mobility  Bed Mobility Overal bed mobility: Needs Assistance Bed Mobility: Supine to Sit     Supine to sit: Min assist Sit to supine: Min assist   General bed mobility comments: Assist for R LE  Transfers Overall transfer level: Needs assistance Equipment used: Rolling walker (2 wheeled) Transfers: Sit to/from Stand Sit to Stand: Mod assist         General transfer comment: Mod assist for lower surface. Increased time. VCs safety, technique, hand placement  Ambulation/Gait Ambulation/Gait assistance: Min guard Ambulation Distance (Feet): 115 Feet Assistive device: Rolling walker (2 wheeled) Gait Pattern/deviations: Step-to pattern;Decreased stride length;Decreased step length - right;Antalgic     General Gait Details: slow gait speed. VCs safety, technique, sequence. Very close guard for safety.    Stairs Stairs: Yes Stairs assistance: Min assist Stair Management: Forwards;With crutches;One rail Right;Step to pattern Number of  Stairs: 2 General stair comments: Multimodal cues for safety, technique, sequence. Wife present during practice. Assist to stabilize throughout.   Wheelchair Mobility    Modified Rankin (Stroke Patients Only)       Balance                                    Cognition Arousal/Alertness: Awake/alert Behavior During Therapy: WFL for tasks assessed/performed Overall Cognitive Status: Within Functional Limits for tasks assessed                      Exercises Total Joint Exercises Ankle Circles/Pumps: AROM;Both;10 reps;Supine Quad Sets: AROM;Both;10 reps;Supine Heel Slides: AAROM;Right;10 reps;Supine Hip ABduction/ADduction: AAROM;Right;10 reps;Supine Straight Leg Raises: AAROM;10 reps;Supine Goniometric ROM: 10-45 degrees    General Comments        Pertinent Vitals/Pain 8-9/10 R knee. Ice left in room for student nurse to place on knee once able.     Home Living Family/patient expects to be discharged to:: Private residence Living Arrangements: Spouse/significant other Available Help at Discharge: Family Type of Home: House Home Access: Stairs to enter Entrance Stairs-Rails: Right;Left;Can reach both [place]: Two level;Able to live on main level with bedroom/bathroom Home Equipment: Gilford Rile - 2 wheels;Crutches      Prior Function Level of Independence: Independent          PT Goals (current goals can now be found in the care plan section) Acute Rehab PT Goals Patient Stated Goal: to regain independence Progress towards PT goals: Progressing toward goals    Frequency  7X/week    PT  Plan Current plan remains appropriate    Co-evaluation             End of Session Equipment Utilized During Treatment: Gait belt;Right knee immobilizer Activity Tolerance: Patient limited by pain Patient left: in bed;with call bell/phone within reach;with family/visitor present     Time: 0301-3143 PT Time Calculation (min): 48 min  Charges:   $Gait Training: 23-37 mins $Therapeutic Exercise: 8-22 mins                    G Codes:      Weston Anna, MPT Pager: [phone]

## 2013-06-04 NOTE — Progress Notes (Signed)
   Subjective: 2 Days Post-Op Procedure(s) (LRB): RIGHT TOTAL KNEE ARTHROPLASTY (Right) Derek [name] reports pain as mild.   Derek [name] seen in rounds with Dr. Wynelle Link. Derek [name] is well, but has had some minor complaints of pain in the knee, requiring pain medications Derek [name] is ready to go home  Objective: Vital signs in last 24 hours: Temp:  [97.4 F (36.3 C)-98 F (36.7 C)] 98 F (36.7 C) (04/15 0525) Pulse Rate:  [73-86] 78 (04/15 0525) Resp:  [16-18] 18 (04/15 0525) BP: (115-139)/(78-90) 139/90 mmHg (04/15 0525) SpO2:  [94 %-99 %] 97 % (04/15 0525)  Intake/Output from previous day:  Intake/Output Summary (Last 24 hours) at 06/04/13 0729 Last data filed at 06/04/13 0314  Gross per 24 hour  Intake 1322.5 ml  Output   4060 ml  Net -2737.5 ml    Intake/Output this shift:    Labs:  Recent Labs  06/03/13 0413 06/04/13 0413  HGB 11.2* 10.3*    Recent Labs  06/03/13 0413 06/04/13 0413  WBC 5.6 6.5  RBC 4.04* 3.67*  HCT 32.2* 29.3*  PLT 153 183    Recent Labs  06/03/13 0413 06/04/13 0413  NA 131* 131*  K 5.3 4.5  CL 97 98  CO2 21 23  BUN 23 23  CREATININE 1.03 0.88  GLUCOSE 415* 235*  CALCIUM 9.8 10.0   No results found for this basename: LABPT, INR,  in the last 72 hours  EXAM: General - Derek [name] is Alert, Appropriate and Oriented Extremity - Neurovascular intact Sensation intact distally Dorsiflexion/Plantar flexion intact Incision - clean, dry, no drainage, healing Motor Function - intact, moving foot and toes well on exam.   Assessment/Plan: 2 Days Post-Op Procedure(s) (LRB): RIGHT TOTAL KNEE ARTHROPLASTY (Right) Procedure(s) (LRB): RIGHT TOTAL KNEE ARTHROPLASTY (Right) Past Medical History  Diagnosis Date  . Hypertension   . Diabetes mellitus   . GERD (gastroesophageal reflux disease)   . Meniere's disease   . Morbid obesity   . Depression   . Arthritis   . 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 Obstructive  sleep apnea     pt states not now-did used to use one  . PONV (postoperative nausea and vomiting)   . Anxiety     prone to "panic attacks"  . 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 History of kidney stones     multiple kidney stones-not a problem at present  . Kidney stone   . Dermatitis     bilateral hands   Principal Problem:   OA (osteoarthritis) of knee Active Problems:   GERD (gastroesophageal reflux disease)   Hypertension   Obstructive sleep apnea   Morbid obesity   Hyponatremia  Estimated body mass index is 34.31 kg/(m^2) as calculated from the following:   Height as of this encounter: 6' 1"$  (1.854 m).   Weight as of this encounter: 117.935 kg (260 lb). Up with therapy Discharge home with home health Diet - Cardiac diet and Diabetic diet Follow up - in 2 weeks Activity - WBAT Disposition - Home Condition Upon Discharge - Good D/C Meds - See DC Summary DVT Prophylaxis - Xarelto  Justis Dupas 06/04/2013, 7:29 AM

## 2013-06-04 NOTE — Plan of Care (Signed)
Problem: Discharge Progression Outcomes Goal: Anticoagulant follow-up in place Outcome: Not Applicable Date Met:  06/04/13 xarelto     

## 2013-06-04 NOTE — Progress Notes (Signed)
Discharged from floor via w/c, wife with pt. No changes in assessment. Derek [name] 

## 2013-06-04 NOTE — Progress Notes (Addendum)
Physical Therapy Treatment Patient Details Name: Derek [name] MRN: [id number] DOB: 11-Aug-1945 Today's Date: 06/04/2013    History of Present Illness 68 yo male s/p R TKA 4/13. Hx of HTN, DM, Meniere's disease, obesity, anxiety    PT Comments    Progressing with mobility.Pt tolerated 2nd walk well. No further questions from pt/wife. All education completed. Ready to d/c from PT standpoint-RN made aware.  Follow Up Recommendations  Home health PT;Supervision/Assistance - 24 hour     Equipment Recommendations  None recommended by PT    Recommendations for Other Services OT consult     Precautions / Restrictions Precautions Precautions: Knee;Fall Required Braces or Orthoses: Knee Immobilizer - Right Knee Immobilizer - Right: Discontinue once straight leg raise with < 10 degree lag Restrictions Weight Bearing Restrictions: No RLE Weight Bearing: Weight bearing as tolerated    Mobility  Bed Mobility Overal bed mobility: Needs Assistance Bed Mobility: Supine to Sit     Supine to sit: Min assist     General bed mobility comments: Assist for R LE  Transfers Overall transfer level: Needs assistance Equipment used: Rolling walker (2 wheeled) Transfers: Sit to/from Stand Sit to Stand: Mod assist         General transfer comment: Mod assist from lower surface. Increased time. VCs safety, technique, hand placement  Ambulation/Gait Ambulation/Gait assistance: Min guard Ambulation Distance (Feet): 115 Feet Assistive device: Rolling walker (2 wheeled) Gait Pattern/deviations: Step-to pattern;Decreased stride length;Decreased step length - right;Antalgic     General Gait Details: slow gait speed. VCs safety, technique, sequence. Very close guard for safety.    Stairs Stairs: Yes Stairs assistance: Min assist Stair Management: Forwards;With crutches;One rail Right;Step to pattern Number of Stairs: 2 General stair comments: Multimodal cues for safety, technique,  sequence. Wife present during practice. Assist to stabilize throughout.   Wheelchair Mobility    Modified Rankin (Stroke Patients Only)       Balance                                    Cognition Arousal/Alertness: Awake/alert Behavior During Therapy: WFL for tasks assessed/performed Overall Cognitive Status: Within Functional Limits for tasks assessed                      Exercises Total Joint Exercises Ankle Circles/Pumps: AROM;Both;10 reps;Supine Quad Sets: AROM;Both;10 reps;Supine Heel Slides: AAROM;Right;10 reps;Supine Hip ABduction/ADduction: AAROM;Right;10 reps;Supine Straight Leg Raises: AAROM;10 reps;Supine Goniometric ROM: 10-45 degrees    General Comments        Pertinent Vitals/Pain 7/10 R knee    Home Living                      Prior Function            PT Goals (current goals can now be found in the care plan section) Acute Rehab PT Goals Patient Stated Goal: to regain independence Progress towards PT goals: Progressing toward goals    Frequency  7X/week    PT Plan Current plan remains appropriate    Co-evaluation             End of Session Equipment Utilized During Treatment: Gait belt;Right knee immobilizer Activity Tolerance: Patient limited by pain Patient left: in bed;with call bell/phone within reach;with family/visitor present     Time: 4098-1191 PT Time Calculation (min): 20 min  Charges:  $Gait Training: 8-22 mins $  Therapeutic Exercise: 8-22 mins                    G Codes:      Weston Anna, MPT Pager: [phone]

## 2013-06-04 NOTE — Progress Notes (Signed)
Patient and wife were counseled on Xarelto and the following informational handout was given to them.   Information on my medicine - XARELTO (Rivaroxaban)  This medication education was reviewed with me or my healthcare representative as part of my discharge preparation.  The pharmacist that spoke with me during my hospital stay was:  Lolita Patella, [place]  Why was Xarelto prescribed for you? Xarelto was prescribed for you to reduce the risk of blood clots forming after orthopedic surgery. The medical term for these abnormal blood clots is venous thromboembolism (VTE).  What do you need to know about xarelto ? Take your Xarelto ONCE DAILY at the same time every day. You may take it either with or without food.  If you have difficulty swallowing the tablet whole, you may crush it and mix in applesauce just prior to taking your dose.  Take Xarelto exactly as prescribed by your doctor and DO NOT stop taking Xarelto without talking to the doctor who prescribed the medication.  Stopping without other VTE prevention medication to take the place of Xarelto may increase your risk of developing a clot.  After discharge, you should have regular check-up appointments with your healthcare provider that is prescribing your Xarelto.    What do you do if you miss a dose? If you miss a dose, take it as soon as you remember on the same day then continue your regularly scheduled once daily regimen the next day. Do not take two doses of Xarelto on the same day.   Important Safety Information A possible side effect of Xarelto is bleeding. You should call your healthcare provider right away if you experience any of the following:   Bleeding from an injury or your nose that does not stop.   Unusual colored urine (red or dark brown) or unusual colored stools (red or black).   Unusual bruising for unknown reasons.   A serious fall or if you hit your head (even if there is no bleeding).  Some  medicines may interact with Xarelto and might increase your risk of bleeding while on Xarelto. To help avoid this, consult your healthcare provider or pharmacist prior to using any new prescription or non-prescription medications, including herbals, vitamins, non-steroidal anti-inflammatory drugs (NSAIDs) and supplements.  This website has more information on Xarelto: [email or web address]

## 2013-06-04 NOTE — Discharge Summary (Signed)
Physician Discharge Summary   Patient ID: Derek [name] MRN: [id number] DOB/AGE: 1945/03/09 68 y.o.  Admit date: 06/02/2013 Discharge date: 06-04-2013  Primary Diagnosis:  Osteoarthritis Right knee(s)  Admission Diagnoses:  Past Medical History  Diagnosis Date  . Hypertension   . Diabetes mellitus   . GERD (gastroesophageal reflux disease)   . Meniere's disease   . Morbid obesity   . Depression   . Arthritis   . 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 Obstructive sleep apnea     pt states not now-did used to use one  . PONV (postoperative nausea and vomiting)   . Anxiety     prone to "panic attacks"  . 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 History of kidney stones     multiple kidney stones-not a problem at present  . Kidney stone   . Dermatitis     bilateral hands   Discharge Diagnoses:   Principal Problem:   OA (osteoarthritis) of knee Active Problems:   GERD (gastroesophageal reflux disease)   Hypertension   Obstructive sleep apnea   Morbid obesity   Hyponatremia  Estimated body mass index is 34.31 kg/(m^2) as calculated from the following:   Height as of this encounter: $RemoveBeforeD'6\' 1"'[id number]$  (1.854 m).   Weight as of this encounter: 117.935 kg (260 lb).  Procedure:  Procedure(s) (LRB): RIGHT TOTAL KNEE ARTHROPLASTY (Right)   Consults: None  HPI: Derek [name] is a 68 y.o. year old male with end stage OA of his right knee with progressively worsening pain and dysfunction. He has constant pain, with activity and at rest and significant functional deficits with difficulties even with ADLs. He has had extensive non-op management including analgesics, injections of cortisone and viscosupplements, and home exercise program, but remains in significant pain with significant dysfunction. Radiographs show bone on bone arthritis medial and patellofemoral. He presents now for right Total Knee Arthroplasty.     Laboratory Data: Admission on 06/02/2013, Discharged on 06/04/2013  Component Date Value Ref Range Status  . ABO/RH(D) 06/02/2013 O POS   Final  . Antibody Screen 06/02/2013 NEG   Final  . Sample Expiration 06/02/2013 06/05/2013   Final  . Glucose-Capillary 06/02/2013 256* 70 - 99 mg/dL Final  . ABO/RH(D) 06/02/2013 O POS   Final  . Glucose-Capillary 06/02/2013 235* 70 - 99 mg/dL Final  . Comment 1 06/02/2013 Documented in Chart   Final  . Glucose-Capillary 06/02/2013 222* 70 - 99 mg/dL Final  . Comment 1 06/02/2013 Documented in Chart   Final  . Comment 2 06/02/2013 Notify RN   Final  . Glucose-Capillary 06/02/2013 236* 70 - 99 mg/dL Final  . Glucose-Capillary 06/02/2013 226* 70 - 99 mg/dL Final  . Glucose-Capillary 06/02/2013 183* 70 - 99 mg/dL Final  . Comment 1 06/02/2013 Notify RN   Final  . WBC 06/03/2013 5.6  4.0 - 10.5 K/uL Final  . RBC 06/03/2013 4.04* 4.22 - 5.81 MIL/uL Final  . Hemoglobin 06/03/2013 11.2* 13.0 - 17.0 g/dL Final  . HCT 06/03/2013 32.2* 39.0 - 52.0 % Final  . MCV 06/03/2013 79.7  78.0 - 100.0 fL Final  . MCH 06/03/2013 27.7  26.0 - 34.0 pg Final  . MCHC 06/03/2013 34.8  30.0 - 36.0 g/dL Final  . RDW 06/03/2013 12.5  11.5 - 15.5 % Final  . Platelets 06/03/2013 153  150 - 400 K/uL Final  .  Sodium 06/03/2013 131* 137 - 147 mEq/L Final  . Potassium 06/03/2013 5.3  3.7 - 5.3 mEq/L Final  . Chloride 06/03/2013 97  96 - 112 mEq/L Final  . CO2 06/03/2013 21  19 - 32 mEq/L Final  . Glucose, Bld 06/03/2013 415* 70 - 99 mg/dL Final  . BUN 06/03/2013 23  6 - 23 mg/dL Final  . Creatinine, Ser 06/03/2013 1.03  0.50 - 1.35 mg/dL Final  . Calcium 06/03/2013 9.8  8.4 - 10.5 mg/dL Final  . GFR calc non Af Amer 06/03/2013 73* >90 mL/min Final  . GFR calc Af Amer 06/03/2013 84* >90 mL/min Final   Comment: (NOTE)                          The eGFR has been calculated using the CKD EPI equation.                          This calculation has not been validated in all  clinical situations.                          eGFR's persistently <90 mL/min signify possible Chronic Kidney                          Disease.  . Glucose-Capillary 06/02/2013 220* 70 - 99 mg/dL Final  . Glucose-Capillary 06/03/2013 327* 70 - 99 mg/dL Final  . Comment 1 06/03/2013 Notify RN   Final  . Comment 2 06/03/2013 Documented in Chart   Final  . Glucose-Capillary 06/03/2013 272* 70 - 99 mg/dL Final  . Comment 1 06/03/2013 Notify RN   Final  . Comment 2 06/03/2013 Documented in Chart   Final  . WBC 06/04/2013 6.5  4.0 - 10.5 K/uL Final  . RBC 06/04/2013 3.67* 4.22 - 5.81 MIL/uL Final  . Hemoglobin 06/04/2013 10.3* 13.0 - 17.0 g/dL Final  . HCT 06/04/2013 29.3* 39.0 - 52.0 % Final  . MCV 06/04/2013 79.8  78.0 - 100.0 fL Final  . MCH 06/04/2013 28.1  26.0 - 34.0 pg Final  . MCHC 06/04/2013 35.2  30.0 - 36.0 g/dL Final  . RDW 06/04/2013 12.7  11.5 - 15.5 % Final  . Platelets 06/04/2013 183  150 - 400 K/uL Final  . Sodium 06/04/2013 131* 137 - 147 mEq/L Final  . Potassium 06/04/2013 4.5  3.7 - 5.3 mEq/L Final  . Chloride 06/04/2013 98  96 - 112 mEq/L Final  . CO2 06/04/2013 23  19 - 32 mEq/L Final  . Glucose, Bld 06/04/2013 235* 70 - 99 mg/dL Final  . BUN 06/04/2013 23  6 - 23 mg/dL Final  . Creatinine, Ser 06/04/2013 0.88  0.50 - 1.35 mg/dL Final  . Calcium 06/04/2013 10.0  8.4 - 10.5 mg/dL Final  . GFR calc non Af Amer 06/04/2013 86* >90 mL/min Final  . GFR calc Af Amer 06/04/2013 >90  >90 mL/min Final   Comment: (NOTE)                          The eGFR has been calculated using the CKD EPI equation.                          This calculation has not been validated in all clinical situations.  eGFR's persistently <90 mL/min signify possible Chronic Kidney                          Disease.  . Glucose-Capillary 06/03/2013 379* 70 - 99 mg/dL Final  . Comment 1 06/03/2013 Notify RN   Final  . Comment 2 06/03/2013 Documented in Chart   Final  .  Glucose-Capillary 06/03/2013 297* 70 - 99 mg/dL Final  . Glucose-Capillary 06/04/2013 270* 70 - 99 mg/dL Final  . Glucose-Capillary 06/04/2013 328* 70 - 99 mg/dL Final  Hospital Outpatient Visit on 05/22/2013  Component Date Value Ref Range Status  . MRSA, PCR 05/22/2013 POSITIVE* NEGATIVE Final   Comment: RESULT CALLED TO, READ BACK BY AND VERIFIED WITH:                          HENDRIX,W AT 1539 ON [id number] BY POTEAT,S  . Staphylococcus aureus 05/22/2013 POSITIVE* NEGATIVE Final   Comment:                                 The Xpert SA Assay (FDA                          approved for NASAL specimens                          in patients over 35 years of age),                          is one component of                          a comprehensive surveillance                          program.  Test performance has                          been validated by [employer] for patients greater                          than or equal to 38 year old.                          It is not intended                          to diagnose infection nor to                          guide or monitor treatment.  Marland Kitchen aPTT 05/22/2013 29  24 - 37 seconds Final  . WBC 05/22/2013 4.0  4.0 - 10.5 K/uL Final  . RBC 05/22/2013 4.47  4.22 - 5.81 MIL/uL Final  . Hemoglobin 05/22/2013 12.3* 13.0 - 17.0 g/dL Final  . HCT 05/22/2013 36.8* 39.0 - 52.0 % Final  . MCV 05/22/2013 82.3  78.0 - 100.0 fL Final  .  MCH 05/22/2013 27.5  26.0 - 34.0 pg Final  . MCHC 05/22/2013 33.4  30.0 - 36.0 g/dL Final  . RDW 05/22/2013 12.9  11.5 - 15.5 % Final  . Platelets 05/22/2013 166  150 - 400 K/uL Final  . Sodium 05/22/2013 136* 137 - 147 mEq/L Final  . Potassium 05/22/2013 5.2  3.7 - 5.3 mEq/L Final  . Chloride 05/22/2013 102  96 - 112 mEq/L Final  . CO2 05/22/2013 24  19 - 32 mEq/L Final  . Glucose, Bld 05/22/2013 262* 70 - 99 mg/dL Final  . BUN 05/22/2013 19  6 - 23 mg/dL Final  . Creatinine, Ser 05/22/2013  0.94  0.50 - 1.35 mg/dL Final  . Calcium 05/22/2013 10.9* 8.4 - 10.5 mg/dL Final  . Total Protein 05/22/2013 7.1  6.0 - 8.3 g/dL Final  . Albumin 05/22/2013 4.0  3.5 - 5.2 g/dL Final  . AST 05/22/2013 16  0 - 37 U/L Final   NO VISIBLE HEMOLYSIS  . ALT 05/22/2013 17  0 - 53 U/L Final  . Alkaline Phosphatase 05/22/2013 97  39 - 117 U/L Final  . Total Bilirubin 05/22/2013 0.3  0.3 - 1.2 mg/dL Final  . GFR calc non Af Amer 05/22/2013 84* >90 mL/min Final  . GFR calc Af Amer 05/22/2013 >90  >90 mL/min Final   Comment: (NOTE)                          The eGFR has been calculated using the CKD EPI equation.                          This calculation has not been validated in all clinical situations.                          eGFR's persistently <90 mL/min signify possible Chronic Kidney                          Disease.  Marland Kitchen Prothrombin Time 05/22/2013 13.3  11.6 - 15.2 seconds Final  . INR 05/22/2013 1.03  0.00 - 1.49 Final  . Color, Urine 05/22/2013 YELLOW  YELLOW Final  . APPearance 05/22/2013 CLEAR  CLEAR Final  . Specific Gravity, Urine 05/22/2013 1.027  1.005 - 1.030 Final  . pH 05/22/2013 6.0  5.0 - 8.0 Final  . Glucose, UA 05/22/2013 >1000* NEGATIVE mg/dL Final  . Hgb urine dipstick 05/22/2013 TRACE* NEGATIVE Final  . Bilirubin Urine 05/22/2013 NEGATIVE  NEGATIVE Final  . Ketones, ur 05/22/2013 NEGATIVE  NEGATIVE mg/dL Final  . Protein, ur 05/22/2013 NEGATIVE  NEGATIVE mg/dL Final  . Urobilinogen, UA 05/22/2013 0.2  0.0 - 1.0 mg/dL Final  . Nitrite 05/22/2013 NEGATIVE  NEGATIVE Final  . Leukocytes, UA 05/22/2013 NEGATIVE  NEGATIVE Final  . WBC, UA 05/22/2013 0-2  <3 WBC/hpf Final  . RBC / HPF 05/22/2013 0-2  <3 RBC/hpf Final  Appointment on 05/05/2013  Component Date Value Ref Range Status  . ALT 05/05/2013 22  0 - 53 U/L Final  . LDL Particle Number 05/05/2013 447  <1000 nmol/L Final   Comment:                            Reference Range:                          ----------------  Low:                <1000                          Moderate:           1000-1299                          Borderline-High:    1300-1599                          High:               1600-2000                          Very High:          >2000                                                        . LDL (calc) 05/05/2013 42  <100 mg/dL Final   Comment: LDL-C is inaccurate if patient is nonfasting.                                                     Reference Range:                          ----------------                          Optimal:            <100                          Near/Above Optimal: 100-129                          Borderline High:    130-159                          High:               160-189                          Very High:          >=190                                                        . HDL-C 05/05/2013 43  >=40 mg/dL Final  . Triglycerides 05/05/2013 120  <150 mg/dL Final  . Cholesterol, Total 05/05/2013 109  <200 mg/dL Final  . HDL Particle Number 05/05/2013 32.1  >=30.5 umol/L Final  . Large HDL-P 05/05/2013 4.9  >=4.8 umol/L Final  . Large VLDL-P 05/05/2013 4.4* <=  2.7 nmol/L Final  . Small LDL Particle Number 05/05/2013 219  <=527 nmol/L Final  . LDL Size 05/05/2013 20.4* >20.5 nm Final  . HDL Size 05/05/2013 9.4  >=9.2 nm Final  . VLDL Size 05/05/2013 51.4* <=46.6 nm Final  . LP-IR Score 05/05/2013 53* <=45 Final   Comment:                            HDL Particle Number, Large HDL-P, Large VLDL-P, Small LDL Particle                          Number, LDL Size, HDL Size, VLDL Size, and LP-IR Score have been                          validated and are reported by [employer] but not cleared by                          the [place] FDA; the clinical utility of these test results has not been                          fully established.                                X-Rays:Dg Chest 2 View  05/22/2013   CLINICAL DATA:   68 year old male preoperative study. Hypertension and diabetes. Initial encounter.  EXAM: CHEST  2 VIEW  COMPARISON:  07/04/2011 and earlier.  FINDINGS: Small hiatal hernia re- identified, new since 2008. Stable lung volumes. Other mediastinal contours are within normal limits. Visualized tracheal air column is within normal limits. No pneumothorax, pulmonary edema, pleural effusion or confluent pulmonary opacity. No acute osseous abnormality identified.  IMPRESSION: No acute cardiopulmonary abnormality.  Small hiatal hernia.   Electronically Signed   By: Lars Pinks M.D.   On: 05/22/2013 10:58    EKG: Orders placed in visit on 12/26/12  . EKG 12-LEAD     Hospital Course: Derek [name] is a 68 y.o. who was admitted to [facility]. They were brought to the operating room on 06/02/2013 and underwent Procedure(s): RIGHT TOTAL KNEE ARTHROPLASTY.  Patient tolerated the procedure well and was later transferred to the recovery room and then to the orthopaedic floor for postoperative care.  They were given PO and IV analgesics for pain control following their surgery.  They were given 24 hours of postoperative antibiotics of  Anti-infectives   Start     Dose/Rate Route Frequency Ordered Stop   06/03/13 0600  ceFAZolin (ANCEF) IVPB 2 g/50 mL premix  Status:  Discontinued     2 g 100 mL/hr over 30 Minutes Intravenous On call to O.R. 06/02/13 1205 06/02/13 1435   06/02/13 2200  ceFAZolin (ANCEF) IVPB 2 g/50 mL premix     2 g 100 mL/hr over 30 Minutes Intravenous Every 6 hours 06/02/13 2006 06/03/13 0447   06/02/13 1445  ceFAZolin (ANCEF) 3 g in dextrose 5 % 50 mL IVPB     3 g 160 mL/hr over 30 Minutes Intravenous  Once 06/02/13 1435 06/02/13 1616   06/02/13 1445  vancomycin (VANCOCIN) 1,500 mg in sodium chloride 0.9 % 500 mL IVPB     1,500 mg 250 mL/hr  over 120 Minutes Intravenous  Once 06/02/13 1435 06/02/13 1655     and started on DVT prophylaxis in the form of Xarelto.   PT and OT were  ordered for total joint protocol.  Discharge planning consulted to help with postop disposition and equipment needs.  Patient had a good night on the evening of surgery.  They started to get up OOB with therapy on day one. Hemovac drain was pulled without difficulty.  Continued to work with therapy into day two.  Dressing was changed on day two and the incision was healing well. Patient was seen in rounds and was ready to go home.   Discharge home with home health  Diet - Cardiac diet and Diabetic diet  Follow up - in 2 weeks  Activity - WBAT  Disposition - Home  Condition Upon Discharge - Good  D/C Meds - See DC Summary  DVT Prophylaxis - Xarelto       Discharge Orders   Future Appointments Provider Department Dept Phone   06/30/2013 5:00 PM Gypsy Lore, [place] [phone]   07/03/2013 3:30 PM Gypsy Lore, [place] [phone]   Future Orders Complete By Expires   Call MD / Call 911  As directed    Change dressing  As directed    Constipation Prevention  As directed    Diet - low sodium heart healthy  As directed    Discharge instructions  As directed    Do not put a pillow under the knee. Place it under the heel.  As directed    Do not sit on low chairs, stoools or toilet seats, as it may be difficult to get up from low surfaces  As directed    Driving restrictions  As directed    Increase activity slowly as tolerated  As directed    Lifting restrictions  As directed    Patient may shower  As directed    TED hose  As directed    Weight bearing as tolerated  As directed    Questions:     Laterality:     Extremity:         Medication List    STOP taking these medications       aspirin EC 81 MG tablet     HYDROcodone-acetaminophen 5-325 MG per tablet  Commonly known as:  NORCO  Replaced by:  HYDROcodone-acetaminophen 7.5-325 MG per tablet     ketoprofen 75 MG capsule  Commonly known as:   ORUDIS     meloxicam 15 MG tablet  Commonly known as:  MOBIC      TAKE these medications       carvedilol 25 MG tablet  Commonly known as:  COREG  Take 25 mg by mouth 2 (two) times daily with a meal.     cyclobenzaprine 10 MG tablet  Commonly known as:  FLEXERIL  Take 1 tablet (10 mg total) by mouth 3 (three) times daily as needed for muscle spasms.     Desoximetasone 0.25 % ointment  Commonly known as:  TOPICORT  Apply 1 application topically daily as needed (On hands). As directed     diphenhydrAMINE 25 MG tablet  Commonly known as:  BENADRYL  Take 50 mg by mouth 2 (two) times daily.     glimepiride 2 MG tablet  Commonly known as:  AMARYL  Take 2 mg by mouth daily with breakfast.     HYDROcodone-acetaminophen 7.5-325  MG per tablet  Commonly known as:  NORCO  Take 1-2 tablets by mouth every 4 (four) hours as needed for moderate pain.     lansoprazole 30 MG capsule  Commonly known as:  PREVACID  Take 30 mg by mouth 2 (two) times daily.     losartan 50 MG tablet  Commonly known as:  COZAAR  Take 50 mg by mouth every morning.     Magnesium 500 MG Tabs  Take 500-1,000 mg by mouth 2 (two) times daily. Takes 1 in the morning and 2 in the evening     metFORMIN 500 MG 24 hr tablet  Commonly known as:  GLUCOPHAGE-XR  Take 1,000-1,500 mg by mouth 2 (two) times daily. Take 2 tablet in the morning and 3 tablet in the evening     rivaroxaban 10 MG Tabs tablet  Commonly known as:  XARELTO  - Take 1 tablet (10 mg total) by mouth daily with breakfast. Take Xarelto for two and a half more weeks, then discontinue Xarelto.  - Once the patient has completed the Xarelto, they may resume the 81 mg Aspirin.     silodosin 8 MG Caps capsule  Commonly known as:  RAPAFLO  Take 8 mg by mouth daily with breakfast. No longer taking     simvastatin 20 MG tablet  Commonly known as:  ZOCOR  Take 20 mg by mouth at bedtime.     tiZANidine 4 MG tablet  Commonly known as:  ZANAFLEX  Take 4  mg by mouth every 6 (six) hours as needed for muscle spasms.     traMADol 50 MG tablet  Commonly known as:  ULTRAM  Take 1-2 tablets (50-100 mg total) by mouth every 6 (six) hours as needed (mild pain).       Follow-up Information   Follow up with Gearlean Alf, MD. Schedule an appointment as soon as possible for a visit in 2 weeks.   Specialty:  Orthopedic Surgery   Contact information:   [address] [place] [address] [phone]       Signed: Mickel Crow 06/30/2013, 7:37 AM

## 2013-06-04 NOTE — Evaluation (Signed)
Occupational Therapy Evaluation Patient Details Name: Derek [name] MRN: [id number] DOB: 04/03/1945 Today's Date: 06/04/2013    History of Present Illness 68 yo male s/p R TKA 4/13. Hx of HTN, DM, Meniere's disease, obesity, anxiety   Clinical Impression   Pt presents to OT with decreased I with ADL activity s/p TKR. All education complete regarding ADL activity s/p TKR    Follow Up Recommendations  No OT follow up    Equipment Recommendations  None recommended by OT       Precautions / Restrictions Precautions Required Braces or Orthoses: Knee Immobilizer - Right Knee Immobilizer - Right: Discontinue once straight leg raise with < 10 degree lag      Mobility Bed Mobility Overal bed mobility: Needs Assistance Bed Mobility: Sit to Supine     Supine to sit: Min assist Sit to supine: Min assist   General bed mobility comments: Assist for R LE onto bed. Increased time. VCs safety, technique, hand placement  Transfers Overall transfer level: Needs assistance Equipment used: Rolling walker (2 wheeled) Transfers: Sit to/from Stand (half stand only to scoot up in the bed) Sit to Stand: Min assist         General transfer comment: Assist to rise, stabilize, control descent. VCs safety, technique, hand placement.     Balance                                            ADL Overall ADL's : Needs assistance/impaired                                       General ADL Comments: Pt educated on safety with ADL activity s/p TKR. Educated on dressing, toilet and tub safety. Wife present for end of session.       Vision                            Extremity/Trunk Assessment Upper Extremity Assessment Upper Extremity Assessment: Generalized weakness           Communication Communication Communication: No difficulties   Cognition Arousal/Alertness: Awake/alert Behavior During Therapy: WFL for tasks  assessed/performed Overall Cognitive Status: Within Functional Limits for tasks assessed                                Home Living Family/patient expects to be discharged to:: Private residence Living Arrangements: Spouse/significant other Available Help at Discharge: Family Type of Home: House Home Access: Stairs to enter Technical brewer of Steps: 4 Entrance Stairs-Rails: Right;Left;Can reach both Home Layout: Two level;Able to live on main level with bedroom/bathroom     Bathroom Shower/Tub: Tub/shower unit   Bathroom Toilet: Handicapped height     Home Equipment: Environmental consultant - 2 wheels;Crutches          Prior Functioning/Environment Level of Independence: Independent                      OT Goals(Current goals can be found in the care plan section) Acute Rehab OT Goals Patient Stated Goal: to regain independence OT Goal Formulation: With patient  OT Frequency:  End of Session Nurse Communication: Mobility status;Patient requests pain meds  Activity Tolerance: Patient limited by pain Patient left: in bed;with call bell/phone within reach;with family/visitor present   Time: 0539-7673 OT Time Calculation (min): 23 min Charges:  OT General Charges $OT Visit: 1 Procedure OT Evaluation $Initial OT Evaluation Tier I: 1 Procedure OT Treatments $Self Care/Home Management : 8-22 mins G-Codes:    Derek [name] 2013/06/28, 9:35 AM

## 2013-06-04 NOTE — Discharge Instructions (Signed)
Dr. Gaynelle Arabian Total Joint Specialist [facility] [address], [place], [place] [address] [phone]  TOTAL KNEE REPLACEMENT POSTOPERATIVE DIRECTIONS    Knee Rehabilitation, Guidelines Following Surgery  Results after knee surgery are often greatly improved when you follow the exercise, range of motion and muscle strengthening exercises prescribed by your doctor. Safety measures are also important to protect the knee from further injury. Any time any of these exercises cause you to have increased pain or swelling in your knee joint, decrease the amount until you are comfortable again and slowly increase them. If you have problems or questions, call your caregiver or physical therapist for advice.   HOME CARE INSTRUCTIONS  Remove items at home which could result in a fall. This includes throw rugs or furniture in walking pathways.  Continue medications as instructed at time of discharge. You may have some home medications which will be placed on hold until you complete the course of blood thinner medication.  You may start showering once you are discharged home but do not submerge the incision under water. Just pat the incision dry and apply a dry gauze dressing on daily. Walk with walker as instructed.  You may resume a sexual relationship in one month or when given the OK by  your doctor.   Use walker as long as suggested by your caregivers.  Avoid periods of inactivity such as sitting longer than an hour when not asleep. This helps prevent blood clots.  You may put full weight on your legs and walk as much as is comfortable.  You may return to work once you are cleared by your doctor.  Do not drive a car for 6 weeks or until released by you surgeon.   Do not drive while taking narcotics.  Wear the elastic stockings for three weeks following surgery during the day but you may remove then at night. Make sure you keep all of your appointments after your  operation with all of your doctors and caregivers. You should call the office at the above phone number and make an appointment for approximately two weeks after the date of your surgery. Change the dressing daily and reapply a dry dressing each time. Please pick up a stool softener and laxative for home use as long as you are requiring pain medications.  Continue to use ice on the knee for pain and swelling from surgery. You may notice swelling that will progress down to the foot and ankle.  This is normal after surgery.  Elevate the leg when you are not up walking on it.   It is important for you to complete the blood thinner medication as prescribed by your doctor.  Continue to use the breathing machine which will help keep your temperature down.  It is common for your temperature to cycle up and down following surgery, especially at night when you are not up moving around and exerting yourself.  The breathing machine keeps your lungs expanded and your temperature down.  RANGE OF MOTION AND STRENGTHENING EXERCISES  Rehabilitation of the knee is important following a knee injury or an operation. After just a few days of immobilization, the muscles of the thigh which control the knee become weakened and shrink (atrophy). Knee exercises are designed to build up the tone and strength of the thigh muscles and to improve knee motion. Often times heat used for twenty to thirty minutes before working out will loosen up your tissues and help with improving the  range of motion but do not use heat for the first two weeks following surgery. These exercises can be done on a training (exercise) mat, on the floor, on a table or on a bed. Use what ever works the best and is most comfortable for you Knee exercises include:  Leg Lifts - While your knee is still immobilized in a splint or cast, you can do straight leg raises. Lift the leg to 60 degrees, hold for 3 sec, and slowly lower the leg. Repeat 10-20 times 2-3  times daily. Perform this exercise against resistance later as your knee gets better.  Quad and Hamstring Sets - Tighten up the muscle on the front of the thigh (Quad) and hold for 5-10 sec. Repeat this 10-20 times hourly. Hamstring sets are done by pushing the foot backward against an object and holding for 5-10 sec. Repeat as with quad sets.  A rehabilitation program following serious knee injuries can speed recovery and prevent re-injury in the future due to weakened muscles. Contact your doctor or a physical therapist for more information on knee rehabilitation.   SKILLED REHAB INSTRUCTIONS: If the patient is transferred to a skilled rehab facility following release from the hospital, a list of the current medications will be sent to the facility for the patient to continue.  When discharged from the skilled rehab facility, please have the facility set up the patient's [place] prior to being released. Also, the skilled facility will be responsible for providing the patient with their medications at time of release from the facility to include their pain medication, the muscle relaxants, and their blood thinner medication. If the patient is still at the rehab facility at time of the two week follow up appointment, the skilled rehab facility will also need to assist the patient in arranging follow up appointment in our office and any transportation needs.  MAKE SURE YOU:  Understand these instructions.  Will watch your condition.  Will get help right away if you are not doing well or get worse.    Pick up stool softner and laxative for home. Do not submerge incision under water. May shower. Continue to use ice for pain and swelling from surgery.  Take Xarelto for two and a half more weeks, then discontinue Xarelto. Once the patient has completed the Xarelto, they may resume the 81 mg Aspirin.

## 2013-06-24 ENCOUNTER — Ambulatory Visit: Payer: Medicare FFS | Attending: Orthopedic Surgery | Admitting: Physical Therapy

## 2013-06-24 DIAGNOSIS — E119 Type 2 diabetes mellitus without complications: Secondary | ICD-10-CM | POA: Diagnosis not present

## 2013-06-24 DIAGNOSIS — I10 Essential (primary) hypertension: Secondary | ICD-10-CM | POA: Insufficient documentation

## 2013-06-24 DIAGNOSIS — R262 Difficulty in walking, not elsewhere classified: Secondary | ICD-10-CM | POA: Insufficient documentation

## 2013-06-24 DIAGNOSIS — M25569 Pain in unspecified knee: Secondary | ICD-10-CM | POA: Diagnosis not present

## 2013-06-24 DIAGNOSIS — M25669 Stiffness of unspecified knee, not elsewhere classified: Secondary | ICD-10-CM | POA: Insufficient documentation

## 2013-06-24 DIAGNOSIS — R609 Edema, unspecified: Secondary | ICD-10-CM | POA: Diagnosis not present

## 2013-06-24 DIAGNOSIS — IMO0001 Reserved for inherently not codable concepts without codable children: Secondary | ICD-10-CM | POA: Insufficient documentation

## 2013-06-30 ENCOUNTER — Ambulatory Visit: Payer: Medicare FFS | Admitting: Physical Therapy

## 2013-06-30 DIAGNOSIS — IMO0001 Reserved for inherently not codable concepts without codable children: Secondary | ICD-10-CM | POA: Diagnosis not present

## 2013-07-03 ENCOUNTER — Ambulatory Visit: Payer: Medicare FFS | Admitting: Physical Therapy

## 2013-07-03 DIAGNOSIS — IMO0001 Reserved for inherently not codable concepts without codable children: Secondary | ICD-10-CM | POA: Diagnosis not present

## 2013-07-09 ENCOUNTER — Ambulatory Visit: Payer: Medicare FFS | Admitting: Physical Therapy

## 2013-07-09 DIAGNOSIS — IMO0001 Reserved for inherently not codable concepts without codable children: Secondary | ICD-10-CM | POA: Diagnosis not present

## 2013-07-11 ENCOUNTER — Ambulatory Visit: Payer: Medicare FFS | Admitting: Physical Therapy

## 2013-07-11 DIAGNOSIS — IMO0001 Reserved for inherently not codable concepts without codable children: Secondary | ICD-10-CM | POA: Diagnosis not present

## 2013-07-15 ENCOUNTER — Ambulatory Visit: Payer: Medicare FFS | Admitting: Rehabilitation

## 2013-07-15 DIAGNOSIS — IMO0001 Reserved for inherently not codable concepts without codable children: Secondary | ICD-10-CM | POA: Diagnosis not present

## 2013-07-21 ENCOUNTER — Ambulatory Visit: Payer: Medicare FFS | Attending: Orthopedic Surgery | Admitting: Physical Therapy

## 2013-07-21 DIAGNOSIS — I10 Essential (primary) hypertension: Secondary | ICD-10-CM | POA: Insufficient documentation

## 2013-07-21 DIAGNOSIS — R262 Difficulty in walking, not elsewhere classified: Secondary | ICD-10-CM | POA: Diagnosis not present

## 2013-07-21 DIAGNOSIS — E119 Type 2 diabetes mellitus without complications: Secondary | ICD-10-CM | POA: Diagnosis not present

## 2013-07-21 DIAGNOSIS — IMO0001 Reserved for inherently not codable concepts without codable children: Secondary | ICD-10-CM | POA: Diagnosis not present

## 2013-07-21 DIAGNOSIS — R609 Edema, unspecified: Secondary | ICD-10-CM | POA: Insufficient documentation

## 2013-07-21 DIAGNOSIS — M25569 Pain in unspecified knee: Secondary | ICD-10-CM | POA: Insufficient documentation

## 2013-07-21 DIAGNOSIS — M25669 Stiffness of unspecified knee, not elsewhere classified: Secondary | ICD-10-CM | POA: Diagnosis not present

## 2013-07-23 ENCOUNTER — Ambulatory Visit: Payer: Medicare FFS | Admitting: Physical Therapy

## 2013-07-23 DIAGNOSIS — IMO0001 Reserved for inherently not codable concepts without codable children: Secondary | ICD-10-CM | POA: Diagnosis not present

## 2013-07-28 ENCOUNTER — Ambulatory Visit: Payer: Medicare FFS | Admitting: Physical Therapy

## 2013-07-28 DIAGNOSIS — IMO0001 Reserved for inherently not codable concepts without codable children: Secondary | ICD-10-CM | POA: Diagnosis not present

## 2013-07-30 ENCOUNTER — Ambulatory Visit: Payer: Medicare FFS | Admitting: Physical Therapy

## 2013-08-04 ENCOUNTER — Ambulatory Visit: Payer: Medicare FFS | Admitting: Physical Therapy

## 2013-08-06 ENCOUNTER — Ambulatory Visit: Payer: Medicare FFS | Admitting: Physical Therapy

## 2013-09-19 ENCOUNTER — Other Ambulatory Visit: Payer: Self-pay | Admitting: *Deleted

## 2013-09-19 MED ORDER — LOSARTAN POTASSIUM 50 MG PO TABS: 50.0000 mg | ORAL_TABLET | Freq: Every morning | ORAL | Status: DC

## 2013-12-12 ENCOUNTER — Emergency Department (INDEPENDENT_AMBULATORY_CARE_PROVIDER_SITE_OTHER)
Admission: EM | Admit: 2013-12-12 | Discharge: 2013-12-12 | Disposition: A | Discharge status: Home / Self Care | Payer: Medicare FFS | Source: Home / Self Care | Attending: Emergency Medicine | Admitting: Emergency Medicine

## 2013-12-12 ENCOUNTER — Encounter: Payer: Self-pay | Admitting: Emergency Medicine

## 2013-12-12 DIAGNOSIS — J208 Acute bronchitis due to other specified organisms: Secondary | ICD-10-CM

## 2013-12-12 DIAGNOSIS — R05 Cough: Secondary | ICD-10-CM

## 2013-12-12 DIAGNOSIS — R059 Cough, unspecified: Secondary | ICD-10-CM

## 2013-12-12 MED ORDER — PREDNISONE (PAK) 10 MG PO TABS: ORAL_TABLET | Freq: Every day | ORAL | Status: DC

## 2013-12-12 MED ORDER — AZITHROMYCIN 250 MG PO TABS: ORAL_TABLET | ORAL | Status: DC

## 2013-12-12 NOTE — ED Notes (Signed)
Pt c/o productive cough x 1 wk. Denies fever.

## 2013-12-12 NOTE — ED Provider Notes (Signed)
CSN: [id number]     Arrival date & time 12/12/13  92 History   First MD Initiated Contact with Patient 12/12/13 1622     Chief Complaint  Patient presents with  . Cough   (Consider location/radiation/quality/duration/timing/severity/associated sxs/prior Treatment) HPI Derek [name] is a 68 y.o. male who complains of onset of cold symptoms for 7 days.  The symptoms are constant and mild-moderate in severity. No sore throat + cough No pleuritic pain No wheezing + nasal congestion + post-nasal drainage +sinus pain/pressure + chest congestion No itchy/red eyes No earache No hemoptysis No SOB No chills/sweats No fever No nausea + vomiting (resolved) No abdominal pain No diarrhea No skin rashes No fatigue No myalgias No headache     Past Medical History  Diagnosis Date  . Hypertension   . Diabetes mellitus   . GERD (gastroesophageal reflux disease)   . Meniere's disease   . Morbid obesity   . Depression   . Arthritis   . 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 Obstructive sleep apnea     pt states not now-did used to use one  . PONV (postoperative nausea and vomiting)   . Anxiety     prone to "panic attacks"  . 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 History of kidney stones     multiple kidney stones-not a problem at present  . Kidney stone   . Dermatitis     bilateral hands   Past Surgical History  Procedure Laterality Date  . Knee surgery  2002    rt knee  . Shoulder surgery  '93bilateral '94 lt    x3  . Hernia repair  [id number]    umbilical  . Inguinal hernia repair  '74,'82  . Knee arthroscopy  03/01/2012    Procedure: ARTHROSCOPY KNEE;  Surgeon: Gearlean Alf, MD;  Location: [facility];  Service: Orthopedics;  Laterality: Left;  WITH SYNOVECTOMY   . Ankle surgery  2010    left-fx- some retained hardware as of 05-22-13  . Hardware removal  2013    ankle-lt  . Anal  fistulectomy N/A 04/01/2013    Procedure: EXAM UNDER ANESTHESIA WITH ANAL FISSURectomy, sphincterotomy and internal hemorrhoidectomy;  Surgeon: Harl Bowie, MD;  Location: [facility];  Service: General;  Laterality: N/A;  . Joint replacement  2009    knee-left  . 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 No pertinent family history. History  Substance Use Topics  . Smoking status: Former Smoker -- 4.00 packs/day for 17 years    Types: Cigarettes    Quit date: 02/21/1975  . Smokeless tobacco: Not on file  . Alcohol Use: No    Review of Systems  All other systems reviewed and are negative.   Allergies  Morphine and related; Oxycontin; and Quinine  Home Medications   Prior to Admission medications   Medication Sig Start Date End Date Taking? Authorizing Provider  azithromycin (ZITHROMAX Z-PAK) 250 MG tablet Use as directed 12/12/13   Janeann Forehand, MD  carvedilol (COREG) 25 MG tablet Take 25 mg by mouth 2 (two) times daily with a meal.  09/26/11   Historical Provider, MD  cyclobenzaprine (FLEXERIL) 10 MG tablet Take 1 tablet (10 mg total)  by mouth 3 (three) times daily as needed for muscle spasms. 06/04/13   Arlee Muslim, PA-C  Desoximetasone (TOPICORT) 0.25 % ointment Apply 1 application topically daily as needed (On hands). As directed 11/29/12   Historical Provider, MD  diphenhydrAMINE (BENADRYL) 25 MG tablet Take 50 mg by mouth 2 (two) times daily.    Historical Provider, MD  glimepiride (AMARYL) 2 MG tablet Take 2 mg by mouth daily with breakfast.  10/14/12   Historical Provider, MD  HYDROcodone-acetaminophen (NORCO) 7.5-325 MG per tablet Take 1-2 tablets by mouth every 4 (four) hours as needed for moderate pain. 06/04/13   Arlee Muslim, PA-C  lansoprazole (PREVACID) 30 MG capsule Take 30 mg by mouth 2  (two) times daily.  08/16/11   Historical Provider, MD  losartan (COZAAR) 50 MG tablet Take 1 tablet (50 mg total) by mouth every morning. 09/19/13   Candee Furbish, MD  Magnesium 500 MG TABS Take 500-1,000 mg by mouth 2 (two) times daily. Takes 1 in the morning and 2 in the evening    Historical Provider, MD  metFORMIN (GLUCOPHAGE-XR) 500 MG 24 hr tablet Take 1,000-1,500 mg by mouth 2 (two) times daily. Take 2 tablet in the morning and 3 tablet in the evening    Historical Provider, MD  predniSONE (STERAPRED UNI-PAK) 10 MG tablet Take by mouth daily. 6 day pack, use as directed 12/12/13   Janeann Forehand, MD  rivaroxaban (XARELTO) 10 MG TABS tablet Take 1 tablet (10 mg total) by mouth daily with breakfast. Take Xarelto for two and a half more weeks, then discontinue Xarelto. Once the patient has completed the Xarelto, they may resume the 81 mg Aspirin. 06/04/13   Arlee Muslim, PA-C  silodosin (RAPAFLO) 8 MG CAPS capsule Take 8 mg by mouth daily with breakfast. No longer taking    Historical Provider, MD  simvastatin (ZOCOR) 20 MG tablet Take 20 mg by mouth at bedtime.  11/17/11   Historical Provider, MD  tiZANidine (ZANAFLEX) 4 MG tablet Take 4 mg by mouth every 6 (six) hours as needed for muscle spasms.    Historical Provider, MD  traMADol (ULTRAM) 50 MG tablet Take 1-2 tablets (50-100 mg total) by mouth every 6 (six) hours as needed (mild pain). 06/04/13   Arlee Muslim, PA-C   BP 159/69  Pulse 68  Temp(Src) 97.7 F (36.5 C) (Oral)  Resp 16  Ht '6\' 2"'$  (1.88 m)  Wt 261 lb (118.389 kg)  BMI 33.50 kg/m2  SpO2 97% Physical Exam  Nursing note and vitals reviewed. Constitutional: He is oriented to person, place, and time. He appears well-developed and well-nourished.  HENT:  Head: Normocephalic and atraumatic.  Right Ear: Tympanic membrane, external ear and ear canal normal.  Left Ear: Tympanic membrane, external ear and ear canal normal.  Nose: Mucosal edema and rhinorrhea present.   Mouth/Throat: Posterior oropharyngeal erythema present. No oropharyngeal exudate or posterior oropharyngeal edema.  Eyes: No scleral icterus.  Neck: Neck supple.  Cardiovascular: Regular rhythm and normal heart sounds.   Pulmonary/Chest: Effort normal and breath sounds normal. No respiratory distress. He has no decreased breath sounds. He has no wheezes. He has no rhonchi.  Neurological: He is alert and oriented to person, place, and time.  Skin: Skin is warm and dry.  Psychiatric: He has a normal mood and affect. His speech is normal.    ED Course  Procedures (including critical care time) Labs Review Labs Reviewed - No data to display  Imaging Review No results found.  MDM   1. Acute bronchitis due to other specified organisms   2. Cough    1)  Take the prescribed antibiotic as instructed.  Z-Pak plus prednisone.  He is allergic to morphine derivatives I did not write for cough medicine 2)  Use nasal saline solution (over the counter) at least 3 times a day. 3)  Use over the counter decongestants like Zyrtec-D every 12 hours as needed to help with congestion.  If you have hypertension, do not take medicines with sudafed.  4)  Can take tylenol every 6 hours or motrin every 8 hours for pain or fever. 5)  Follow up with your primary doctor if no improvement in 5-7 days, sooner if increasing pain, fever, or new symptoms.     Janeann Forehand, MD 12/12/13 [phone]

## 2014-07-31 ENCOUNTER — Encounter: Payer: Self-pay | Admitting: Internal Medicine

## 2014-10-13 ENCOUNTER — Ambulatory Visit: Payer: Medicare FFS | Admitting: Internal Medicine

## 2014-10-16 ENCOUNTER — Ambulatory Visit (INDEPENDENT_AMBULATORY_CARE_PROVIDER_SITE_OTHER): Payer: PPO | Admitting: Internal Medicine

## 2014-10-16 ENCOUNTER — Encounter: Payer: Self-pay | Admitting: Internal Medicine

## 2014-10-16 VITALS — BP 120/66 | HR 76 | Ht 71.5 in | Wt 262.2 lb

## 2014-10-16 DIAGNOSIS — R1319 Other dysphagia: Secondary | ICD-10-CM

## 2014-10-16 DIAGNOSIS — R1314 Dysphagia, pharyngoesophageal phase: Secondary | ICD-10-CM

## 2014-10-16 DIAGNOSIS — R110 Nausea: Secondary | ICD-10-CM

## 2014-10-16 DIAGNOSIS — K920 Hematemesis: Secondary | ICD-10-CM | POA: Diagnosis not present

## 2014-10-16 DIAGNOSIS — K219 Gastro-esophageal reflux disease without esophagitis: Secondary | ICD-10-CM

## 2014-10-16 DIAGNOSIS — R1310 Dysphagia, unspecified: Secondary | ICD-10-CM

## 2014-10-16 NOTE — Patient Instructions (Signed)
You have been scheduled for an endoscopy. Please follow written instructions given to you at your visit today. If you use inhalers (even only as needed), please bring them with you on the day of your procedure.  I appreciate the opportunity to care for you.  

## 2014-10-16 NOTE — Progress Notes (Signed)
Subjective:    Patient ID: Derek [name], male    DOB: [birth year], 69 y.o.   MRN: [id number] Cc: reflux, swallowing problems HPI Elderly wm here w/ wife - has had recurrent regurgitation and reflux sxs. Has to take 4-6 lansoprazole 15 mg at or after bedtime to relieve this. Not many sxs in day. Other PPI's do not seem to work. Sounds like there is some dysphagia though not a predominant feature. No weight loss. Had an episoede of "violent vomiting" in past 1-2 months with hematemesis - small amount it seems. No melena. Has had 1-2 EGD at [facility] before - do not think he had a dilation. Generally avoids eating close to bed and sleeps "almost sitting up" using pillows. Thinks when he slides off the pillows is when some of the regurgitation sxs begin. CXR in past shows small hiatal hernia. Also has chronic cough sxs Diet routine - small breakfast 10-11 AM, large meal late afternoon, snack (sandwich) 9-10 - to sleep upright 1-2 AM.  Allergies  Allergen Reactions  . Morphine And Related Nausea And Vomiting  . Oxycontin [Oxycodone Hcl] Swelling    Lips swells  . Quinine Other (See Comments)    Platelets dropped  . Voltaren [Diclofenac Sodium] Other (See Comments)    Elevated liver enzymes   Outpatient Prescriptions Prior to Visit  Medication Sig Dispense Refill  . carvedilol (COREG) 25 MG tablet Take 25 mg by mouth 2 (two) times daily with a meal.     . Desoximetasone (TOPICORT) 0.25 % ointment Apply 1 application topically daily as needed (On hands). As directed    . diphenhydrAMINE (BENADRYL) 25 MG tablet Take 50 mg by mouth 2 (two) times daily.    . lansoprazole (PREVACID) 30 MG capsule Take 30 mg by mouth 2 (two) times daily.     . Magnesium 500 MG TABS Take 500-1,000 mg by mouth 2 (two) times daily. Takes 1 in the morning and 2 in the evening    . metFORMIN (GLUCOPHAGE-XR) 500 MG 24 hr tablet Take 1,000-1,500 mg by mouth 2 (two) times daily. Take 2 tablet in the morning and 3 tablet in  the evening    . azithromycin (ZITHROMAX Z-PAK) 250 MG tablet Use as directed 6 tablet 0  . cyclobenzaprine (FLEXERIL) 10 MG tablet Take 1 tablet (10 mg total) by mouth 3 (three) times daily as needed for muscle spasms. 90 tablet 0  . glimepiride (AMARYL) 2 MG tablet Take 2 mg by mouth daily with breakfast.     . HYDROcodone-acetaminophen (NORCO) 7.5-325 MG per tablet Take 1-2 tablets by mouth every 4 (four) hours as needed for moderate pain. 80 tablet 0  . losartan (COZAAR) 50 MG tablet Take 1 tablet (50 mg total) by mouth every morning. 90 tablet 1  . predniSONE (STERAPRED UNI-PAK) 10 MG tablet Take by mouth daily. 6 day pack, use as directed 21 tablet 0  . rivaroxaban (XARELTO) 10 MG TABS tablet Take 1 tablet (10 mg total) by mouth daily with breakfast. Take Xarelto for two and a half more weeks, then discontinue Xarelto. Once the patient has completed the Xarelto, they may resume the 81 mg Aspirin. 19 tablet 0  . silodosin (RAPAFLO) 8 MG CAPS capsule Take 8 mg by mouth daily with breakfast. No longer taking    . simvastatin (ZOCOR) 20 MG tablet Take 20 mg by mouth at bedtime.     Marland Kitchen tiZANidine (ZANAFLEX) 4 MG tablet Take 4 mg by mouth every 6 (  six) hours as needed for muscle spasms.    . traMADol (ULTRAM) 50 MG tablet Take 1-2 tablets (50-100 mg total) by mouth every 6 (six) hours as needed (mild pain). 60 tablet 1   No facility-administered medications prior to visit.   Past Medical History  Diagnosis Date  . Hypertension   . Diabetes mellitus   . GERD (gastroesophageal reflux disease)   . Meniere's disease   . Morbid obesity   . Depression   . Arthritis   . 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 Obstructive sleep apnea     pt states not now-did used to use one  . PONV (postoperative nausea and vomiting)   . Anxiety     prone to "panic attacks"  . 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 History of kidney  stones     multiple kidney stones-not a problem at present  . Kidney stone   . Dermatitis     bilateral hands  . Fissure, anal   . BPH (benign prostatic hyperplasia)    Past Surgical History  Procedure Laterality Date  . Knee arthroscopy Right 2002    rt knee  . Rotator cuff repair  '93bilateral '94 lt    x3  . Umbilical hernia repair  2000  . Inguinal hernia repair  '74,'82    x 2  . Knee arthroscopy  03/01/2012    Procedure: ARTHROSCOPY KNEE;  Surgeon: Gearlean Alf, MD;  Location: [facility];  Service: Orthopedics;  Laterality: Left;  WITH SYNOVECTOMY   . Ankle surgery Left 2010    left-fx- some retained hardware as of 05-22-13  . Hardware removal Left 2013    ankle-lt  . Anal fistulectomy N/A 04/01/2013    Procedure: EXAM UNDER ANESTHESIA WITH ANAL FISSURectomy, sphincterotomy and internal hemorrhoidectomy;  Surgeon: Harl Bowie, MD;  Location: [facility];  Service: General;  Laterality: N/A;  . Replacement total knee Left 2009    knee-left  . Total knee arthroplasty Right 06/02/2013    Procedure: RIGHT TOTAL KNEE ARTHROPLASTY;  Surgeon: Gearlean Alf, MD;  Location: [facility];  Service: Orthopedics;  Laterality: Right;  . Colonoscopy    . Upper gastrointestinal endoscopy     Social History   Social History  . Marital Status: Married    Spouse Name: N/A  . Number of Children: 2  . Years of Education: N/A   Occupational History  . retired     Social History Main Topics  . Smoking status: Former Smoker -- 4.00 packs/day for 17 years    Types: Cigarettes    Quit date: 02/21/1975  . Smokeless tobacco: Never Used  . Alcohol Use: No  . Drug Use: No  . Sexual Activity: Yes   Other Topics Concern  . None   Social History Narrative   Married, retired Scientist, product/process development   1 son 1 daughter   2 caffeine drinks/day   10/18/2014      Family History  Problem Relation Age of Onset  . Factor V Leiden deficiency Daughter   . Diabetes  Maternal Grandmother   . Heart disease Paternal Uncle   . Bone cancer Father     Jaw       Review of Systems Has neurpathy sxs w/ DM. Chronic back pain, allergy sxs, joint pains, excessive urination + thirst, dyspnea on exertion, mm cramps All other  ROS neg or per HPI    Objective:   Physical Exam @BP$  120/66 mmHg  Pulse 76  Ht 5' 11.5" (1.816 m)  Wt 262 lb 4 oz (118.956 kg)  BMI 36.07 kg/m2@  General:  Well-developed, well-nourished and in no acute distress - obese Eyes:  anicteric. ENT:   Mouth and posterior pharynx free of lesions.  Neck:   supple w/o thyromegaly or mass.  Lungs: Clear to auscultation bilaterally. Heart:  S1S2, no rubs, murmurs, gallops. Abdomen:  obese soft, non-tender, no hepatosplenomegaly, hernia, or mass and BS+. No splash Lymph:  no cervical or supraclavicular adenopathy. Extremities:   no cyanosis or clubbing Skin   no rash, multiple aging/sun dmage lesions Neuro:  A&O x 3.  Psych:  appropriate mood and  Affect.  Data Reviewed: 2014 colonoscopy 05/2013 CXR - showed small hiatal hernia EGD 2010  [facility], reactive gastropathy EGD 2008 - [facility], gastritis antrum     Assessment & Plan:  Gastroesophageal reflux disease, esophagitis presence not specified  Esophageal dysphagia  Hematemesis with nausea  Chronic cough  1. Schedule for EGD, possible dilation The risks and benefits as well as alternatives of endoscopic procedure(s) have been discussed and reviewed. All questions answered. The patient agrees to proceed. 2. ? Anatomic issue, gastroparesis, other prolem - ?? ZE but seems like mainly a nocturnal problem so that makes it less likely 3. ? Trial of nocturnal metaclopramide 4. He asked about fundoplication but medical co-morbidities raise ? If that is appropriate  I appreciate the opportunity to care for this patient. Cc: Melinda Crutch, MD

## 2014-10-18 ENCOUNTER — Encounter: Payer: Self-pay | Admitting: Internal Medicine

## 2014-10-22 HISTORY — PX: UPPER GASTROINTESTINAL ENDOSCOPY: SHX188

## 2014-10-23 ENCOUNTER — Ambulatory Visit (AMBULATORY_SURGERY_CENTER): Payer: PPO | Admitting: Internal Medicine

## 2014-10-23 ENCOUNTER — Encounter: Payer: Self-pay | Admitting: Internal Medicine

## 2014-10-23 VITALS — BP 123/75 | HR 62 | Temp 98.0°F | Resp 31 | Ht 71.5 in | Wt 262.0 lb

## 2014-10-23 DIAGNOSIS — K2970 Gastritis, unspecified, without bleeding: Secondary | ICD-10-CM | POA: Diagnosis not present

## 2014-10-23 DIAGNOSIS — R1314 Dysphagia, pharyngoesophageal phase: Secondary | ICD-10-CM

## 2014-10-23 DIAGNOSIS — K259 Gastric ulcer, unspecified as acute or chronic, without hemorrhage or perforation: Secondary | ICD-10-CM | POA: Diagnosis not present

## 2014-10-23 DIAGNOSIS — K219 Gastro-esophageal reflux disease without esophagitis: Secondary | ICD-10-CM

## 2014-10-23 DIAGNOSIS — R1310 Dysphagia, unspecified: Secondary | ICD-10-CM

## 2014-10-23 DIAGNOSIS — R1319 Other dysphagia: Secondary | ICD-10-CM

## 2014-10-23 DIAGNOSIS — K2100 Gastro-esophageal reflux disease with esophagitis, without bleeding: Secondary | ICD-10-CM

## 2014-10-23 LAB — GLUCOSE, CAPILLARY
Glucose-Capillary: 144 mg/dL — ABNORMAL HIGH (ref 65–99)
Glucose-Capillary: 132 mg/dL — ABNORMAL HIGH (ref 65–99)

## 2014-10-23 MED ORDER — METOCLOPRAMIDE HCL 10 MG PO TABS: 10.0000 mg | ORAL_TABLET | Freq: Every day | ORAL | Status: DC

## 2014-10-23 MED ORDER — ALUM HYDROXIDE-MAG CARBONATE 95-358 MG/15ML PO SUSP: 30.0000 mL | Freq: Every evening | ORAL | Status: DC | PRN

## 2014-10-23 MED ORDER — SODIUM CHLORIDE 0.9 % IV SOLN: 500.0000 mL | INTRAVENOUS | Status: DC

## 2014-10-23 NOTE — Progress Notes (Signed)
Called to room to assist during endoscopic procedure.  Patient ID and intended procedure confirmed with present staff. Received instructions for my participation in the procedure from the performing physician.  Randall Hiss, RN assisted me with Olympus 18,19, 20 balloon esophageal dilatation.  maw

## 2014-10-23 NOTE — Patient Instructions (Addendum)
I saw a hiatal hernia and inflammation in stomach - some could be from naproxen. Also have inflammation in esophagus.   want you to take Prevacid 30 mg before you eat first meal and before second meal. Take new medicine metaclopramide at bedtime and take 2 tablespoons of Gaviscon at bedtime also.  I will call with biopsy results.  I appreciate the opportunity to care for you. Gatha Mayer, MD, FACG  YOU HAD AN ENDOSCOPIC PROCEDURE TODAY AT [facility]:   Refer to the procedure report that was given to you for any specific questions about what was found during the examination.  If the procedure report does not answer your questions, please call your gastroenterologist to clarify.  If you requested that your care partner not be given the details of your procedure findings, then the procedure report has been included in a sealed envelope for you to review at your convenience later.  YOU SHOULD EXPECT: Some feelings of bloating in the abdomen. Passage of more gas than usual.  Walking can help get rid of the air that was put into your GI tract during the procedure and reduce the bloating. If you had a lower endoscopy (such as a colonoscopy or flexible sigmoidoscopy) you may notice spotting of blood in your stool or on the toilet paper. If you underwent a bowel prep for your procedure, you may not have a normal bowel movement for a few days.  Please Note:  You might notice some irritation and congestion in your nose or some drainage.  This is from the oxygen used during your procedure.  There is no need for concern and it should clear up in a day or so.  SYMPTOMS TO REPORT IMMEDIATELY:     Following upper endoscopy (EGD)  Vomiting of blood or coffee ground material  New chest pain or pain under the shoulder blades  Painful or persistently difficult swallowing  New shortness of breath  Fever of 100F or higher  Black, tarry-looking stools  For urgent or emergent  issues, a gastroenterologist can be reached at any hour by calling [phone].   DIET: Your first meal following the procedure should be a small meal and then it is ok to progress to your normal diet. Heavy or fried foods are harder to digest and may make you feel nauseous or bloated.  Likewise, meals heavy in dairy and vegetables can increase bloating.  Drink plenty of fluids but you should avoid alcoholic beverages for 24 hours.  ACTIVITY:  You should plan to take it easy for the rest of today and you should NOT DRIVE or use heavy machinery until tomorrow (because of the sedation medicines used during the test).    FOLLOW UP: Our staff will call the number listed on your records the next business day following your procedure to check on you and address any questions or concerns that you may have regarding the information given to you following your procedure. If we do not reach you, we will leave a message.  However, if you are feeling well and you are not experiencing any problems, there is no need to return our call.  We will assume that you have returned to your regular daily activities without incident.  If any biopsies were taken you will be contacted by phone or by letter within the next 1-3 weeks.  Please call us at [phone] if you have not heard about the biopsies in 3 weeks.  SIGNATURES/CONFIDENTIALITY: You and/or your care partner have signed paperwork which will be entered into your electronic medical record.  These signatures attest to the fact that that the information above on your After Visit Summary has been reviewed and is understood.  Full responsibility of the confidentiality of this discharge information lies with you and/or your care-partner.  After dilation diet given.  Hiatal hernia and gastritis  information given.

## 2014-10-23 NOTE — Progress Notes (Signed)
To recovery, freport to [employer], Therapist, sports, VSS

## 2014-10-23 NOTE — Progress Notes (Signed)
Pt has Meniere's disease- states he must get up slowly.  Mart Piggs RN made aware of this.

## 2014-10-23 NOTE — Op Note (Addendum)
[name] [name], [id number]   ENDOSCOPY PROCEDURE REPORT  PATIENT: Derek, [name]  MR#: #[id number] BIRTHDATE: 1945-11-16 , 29  yrs. old GENDER: male ENDOSCOPIST: Gatha Mayer, MD, [facility] PROCEDURE DATE:  10/23/2014 PROCEDURE:  EGD w/ biopsy and EGD w/ balloon dilation ASA CLASS:     Class III INDICATIONS:  heartburn, dysphagia, and history of esophageal reflux. MEDICATIONS: Propofol 200 mg IV and Monitored anesthesia care TOPICAL ANESTHETIC: none  DESCRIPTION OF PROCEDURE: After the risks benefits and alternatives of the procedure were thoroughly explained, informed consent was obtained.  The LB [id number] endoscope was introduced through the mouth and advanced to the second portion of the duodenum , Without limitations.  The instrument was slowly withdrawn as the mucosa was fully examined.   1) LA Grade B erosive esophagitis 2) 7-8 cm hiatal hernia with Lysbeth Galas erosions (37-8 to 45 cm) 3) Mild erosive antral gastritis with white plaques - biopsied 4) Otherwise normal - GE junction dilated to 20 mm w/ balloon - no heme.  Retroflexed views revealed a hiatal hernia and Retroflexed views revealed as previously described.     The scope was then withdrawn from the patient and the procedure completed.  COMPLICATIONS: There were no immediate complications.  ENDOSCOPIC IMPRESSION: 1) LA Grade B erosive esophagitis 2) 7-8 cm hiatal hernia with Lysbeth Galas erosions (37-8 to 45 cm) 3) Mild erosive antral gastritis with white plaques - biopsied 4) Otherwise normal - GE junction dilated to 20 mm w/ balloon - no heme  RECOMMENDATIONS: Prevacid 30 mg bid (continue) Gaviscon 30 mg qhs metaclopramide 10 mg qhs will call results and f/u ? needs UGIS   eSigned:  Gatha Mayer, MD, [facility] 10/23/2014 11:54 AM Revised: 10/23/2014 11:54 AM   SE:[id number] Harrington Challenger, MD and The Patient

## 2014-10-27 ENCOUNTER — Telehealth: Payer: Self-pay | Admitting: *Deleted

## 2014-10-27 NOTE — Telephone Encounter (Signed)
  Follow up Call-  Call back number 10/23/2014  Post procedure Call Back phone  # wif'e phone- [phone]  Permission to leave phone message Yes     Patient questions:  Do you have a fever, pain , or abdominal swelling? No. Pain Score  0 *  Have you tolerated food without any problems? Yes.    Have you been able to return to your normal activities? Yes.    Do you have any questions about your discharge instructions: Diet   No. Medications  No. Follow up visit  No.  Do you have questions or concerns about your Care? No.  Actions: * If pain score is 4 or above: No action needed, pain <4.

## 2014-11-01 NOTE — Progress Notes (Signed)
Quick Note:  Please call and let him know biopsies show inflammation without infection and no cancer  1) Ask if better at night w/ metaclopramide 2) Schedule upper GI series - GERD, hiatal hernia 3) F/u me late Oct/Nov 4)No letter or recall from [facility]  ______

## 2014-11-02 ENCOUNTER — Other Ambulatory Visit: Payer: Self-pay

## 2014-11-02 DIAGNOSIS — K219 Gastro-esophageal reflux disease without esophagitis: Secondary | ICD-10-CM

## 2014-11-02 DIAGNOSIS — K449 Diaphragmatic hernia without obstruction or gangrene: Secondary | ICD-10-CM

## 2014-11-10 ENCOUNTER — Other Ambulatory Visit: Payer: Self-pay | Admitting: Family Medicine

## 2014-11-10 DIAGNOSIS — R1084 Generalized abdominal pain: Secondary | ICD-10-CM

## 2014-11-13 ENCOUNTER — Ambulatory Visit (HOSPITAL_COMMUNITY)
Admission: RE | Admit: 2014-11-13 | Discharge: 2014-11-13 | Disposition: A | Discharge status: Home / Self Care | Payer: PPO | Source: Ambulatory Visit | Attending: Internal Medicine | Admitting: Internal Medicine

## 2014-11-13 DIAGNOSIS — K449 Diaphragmatic hernia without obstruction or gangrene: Secondary | ICD-10-CM | POA: Diagnosis not present

## 2014-11-13 DIAGNOSIS — R112 Nausea with vomiting, unspecified: Secondary | ICD-10-CM | POA: Insufficient documentation

## 2014-11-13 DIAGNOSIS — K219 Gastro-esophageal reflux disease without esophagitis: Secondary | ICD-10-CM

## 2014-11-13 NOTE — Progress Notes (Signed)
Quick Note:  Hiatal hernia is medium size - will discuss more at f/u Please let him know ______

## 2014-12-04 ENCOUNTER — Ambulatory Visit
Admission: RE | Admit: 2014-12-04 | Discharge: 2014-12-04 | Disposition: A | Discharge status: Home / Self Care | Payer: PPO | Source: Ambulatory Visit | Attending: Family Medicine | Admitting: Family Medicine

## 2014-12-04 DIAGNOSIS — R1084 Generalized abdominal pain: Secondary | ICD-10-CM

## 2014-12-11 ENCOUNTER — Other Ambulatory Visit: Payer: Self-pay | Admitting: Family Medicine

## 2014-12-11 DIAGNOSIS — R935 Abnormal findings on diagnostic imaging of other abdominal regions, including retroperitoneum: Secondary | ICD-10-CM

## 2014-12-18 ENCOUNTER — Ambulatory Visit
Admission: RE | Admit: 2014-12-18 | Discharge: 2014-12-18 | Disposition: A | Discharge status: Home / Self Care | Payer: PPO | Source: Ambulatory Visit | Attending: Family Medicine | Admitting: Family Medicine

## 2014-12-18 DIAGNOSIS — R935 Abnormal findings on diagnostic imaging of other abdominal regions, including retroperitoneum: Secondary | ICD-10-CM

## 2014-12-18 MED ORDER — IOPAMIDOL (ISOVUE-300) INJECTION 61%
125.0000 mL | Freq: Once | INTRAVENOUS | Status: AC | PRN
  Administered 2014-12-18: 125 mL via INTRAVENOUS

## 2014-12-22 ENCOUNTER — Telehealth: Payer: Self-pay | Admitting: Internal Medicine

## 2014-12-22 NOTE — Telephone Encounter (Signed)
CT shows pancreatic mass I told Dr. Harrington Challenger we would contact the patient and arrange for EUS if Dr. Ardis Hughs agrees

## 2014-12-23 NOTE — Telephone Encounter (Signed)
Derek [name], i reviewed the films, reports.   We'll get him scheduled.  Thanks  Patty, he needs upper eus radial +/- linear, MAC sedation, next Thursday for abnormal pancreas on recent CT scan.

## 2014-12-23 NOTE — Telephone Encounter (Signed)
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 Ardis Hughs to review and schedule.

## 2014-12-24 ENCOUNTER — Other Ambulatory Visit: Payer: Self-pay

## 2014-12-24 DIAGNOSIS — R948 Abnormal results of function studies of other organs and systems: Secondary | ICD-10-CM

## 2014-12-24 NOTE — Telephone Encounter (Signed)
EUS scheduled, pt instructed and medications reviewed.  Patient instructions mailed to home.  Patient to call with any questions or concerns.  

## 2014-12-25 ENCOUNTER — Encounter (HOSPITAL_COMMUNITY): Payer: Self-pay | Admitting: *Deleted

## 2014-12-31 ENCOUNTER — Encounter (HOSPITAL_COMMUNITY)
Admission: RE | Disposition: A | Discharge status: Home / Self Care | Payer: Self-pay | Source: Ambulatory Visit | Attending: Gastroenterology

## 2014-12-31 ENCOUNTER — Ambulatory Visit (HOSPITAL_COMMUNITY): Payer: PPO | Admitting: Certified Registered Nurse Anesthetist

## 2014-12-31 ENCOUNTER — Ambulatory Visit (HOSPITAL_COMMUNITY)
Admission: RE | Admit: 2014-12-31 | Discharge: 2014-12-31 | Disposition: A | Discharge status: Home / Self Care | Payer: PPO | Source: Ambulatory Visit | Attending: Gastroenterology | Admitting: Gastroenterology

## 2014-12-31 ENCOUNTER — Encounter (HOSPITAL_COMMUNITY): Payer: Self-pay

## 2014-12-31 DIAGNOSIS — R933 Abnormal findings on diagnostic imaging of other parts of digestive tract: Secondary | ICD-10-CM | POA: Diagnosis present

## 2014-12-31 DIAGNOSIS — Z7984 Long term (current) use of oral hypoglycemic drugs: Secondary | ICD-10-CM | POA: Insufficient documentation

## 2014-12-31 DIAGNOSIS — R948 Abnormal results of function studies of other organs and systems: Secondary | ICD-10-CM | POA: Diagnosis not present

## 2014-12-31 DIAGNOSIS — E119 Type 2 diabetes mellitus without complications: Secondary | ICD-10-CM | POA: Diagnosis not present

## 2014-12-31 DIAGNOSIS — Z79899 Other long term (current) drug therapy: Secondary | ICD-10-CM | POA: Diagnosis not present

## 2014-12-31 DIAGNOSIS — Z96651 Presence of right artificial knee joint: Secondary | ICD-10-CM | POA: Insufficient documentation

## 2014-12-31 DIAGNOSIS — C258 Malignant neoplasm of overlapping sites of pancreas: Secondary | ICD-10-CM | POA: Insufficient documentation

## 2014-12-31 DIAGNOSIS — M1990 Unspecified osteoarthritis, unspecified site: Secondary | ICD-10-CM | POA: Insufficient documentation

## 2014-12-31 DIAGNOSIS — I10 Essential (primary) hypertension: Secondary | ICD-10-CM | POA: Insufficient documentation

## 2014-12-31 DIAGNOSIS — G4730 Sleep apnea, unspecified: Secondary | ICD-10-CM | POA: Diagnosis not present

## 2014-12-31 DIAGNOSIS — Z87891 Personal history of nicotine dependence: Secondary | ICD-10-CM | POA: Insufficient documentation

## 2014-12-31 DIAGNOSIS — K869 Disease of pancreas, unspecified: Secondary | ICD-10-CM

## 2014-12-31 DIAGNOSIS — N400 Enlarged prostate without lower urinary tract symptoms: Secondary | ICD-10-CM | POA: Diagnosis not present

## 2014-12-31 DIAGNOSIS — K219 Gastro-esophageal reflux disease without esophagitis: Secondary | ICD-10-CM | POA: Diagnosis not present

## 2014-12-31 DIAGNOSIS — G4733 Obstructive sleep apnea (adult) (pediatric): Secondary | ICD-10-CM | POA: Insufficient documentation

## 2014-12-31 HISTORY — PX: EUS: SHX5427

## 2014-12-31 SURGERY — UPPER ENDOSCOPIC ULTRASOUND (EUS) LINEAR
Anesthesia: Monitor Anesthesia Care

## 2014-12-31 MED ORDER — PROPOFOL 10 MG/ML IV BOLUS
INTRAVENOUS | Status: AC
  Filled 2014-12-31: qty 20

## 2014-12-31 MED ORDER — ONDANSETRON HCL 4 MG/2ML IJ SOLN
INTRAMUSCULAR | Status: DC | PRN
  Administered 2014-12-31: 4 mg via INTRAVENOUS

## 2014-12-31 MED ORDER — ONDANSETRON HCL 4 MG/2ML IJ SOLN
INTRAMUSCULAR | Status: AC
  Filled 2014-12-31: qty 2

## 2014-12-31 MED ORDER — PROPOFOL 500 MG/50ML IV EMUL
INTRAVENOUS | Status: DC | PRN
  Administered 2014-12-31: 300 ug/kg/min via INTRAVENOUS

## 2014-12-31 MED ORDER — SODIUM CHLORIDE 0.9 % IV SOLN: INTRAVENOUS | Status: DC

## 2014-12-31 MED ORDER — LACTATED RINGERS IV SOLN
INTRAVENOUS | Status: DC
  Administered 2014-12-31: 1000 mL via INTRAVENOUS

## 2014-12-31 NOTE — Op Note (Signed)
[place], [id number]   ENDOSCOPIC ULTRASOUND PROCEDURE REPORT  PATIENT: Derek [name], Derek [name]  MR#: [id number] BIRTHDATE: August 25, 1945  GENDER: male ENDOSCOPIST: Milus Banister, MD REFERRED BY:  Gatha Mayer, M.D, [facility] PROCEDURE DATE:  12/31/2014 PROCEDURE:   Upper EUS w/FNA ASA CLASS:      Class II INDICATIONS:   1.  abnormal pancreas on recent US, CT. MEDICATIONS: Monitored anesthesia care  DESCRIPTION OF PROCEDURE:   After the risks benefits and alternatives of the procedure were  explained, informed consent was obtained. The patient was then placed in the left, lateral, decubitus postion and IV sedation was administered. Throughout the procedure, the patients blood pressure, pulse and oxygen saturations were monitored continuously.  Under direct visualization, the PENTAX EUS SCOPE  endoscope was introduced through the mouth  and advanced to the second portion of the duodenum .  Water was used as necessary to provide an acoustic interface.  Upon completion of the imaging, water was removed and the patient was sent to the recovery room in satisfactory condition.   Endoscopic findings (limited views with radial and linear echoendoscope): 1. Normal UGI tract  EUS findings: 1. 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 The mass has discrete outer margins and does not involve the splenic vessels, SMA, SMV, PV or celiac trunk.  The mass was sampled with three transgastric passes with a 25 gauge EUS FNA needle. 2. The remaining pancreatic parenchyma was otherwise normal. 3. Main pancreatic duct was normal, non-dilated 4. The CBD was normal, non-dilated 5. No peripancreatic adenopathy. 6. Limited views of liver, spleen, portal and splenic vessels were all normal.   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  RECOMMENDATIONS: Await final cytology results. This will likely require surgical resection (distal pancreatectomy)  _______________________________ eSigned:  DMilus Banister MD 12/31/2014 1:33 PM

## 2014-12-31 NOTE — Anesthesia Postprocedure Evaluation (Signed)
  Anesthesia Post-op Note  Patient: Derek [name]  Procedure(s) Performed: Procedure(s): UPPER ENDOSCOPIC ULTRASOUND (EUS) LINEAR (N/A)  Patient Location: Endoscopy Unit  Anesthesia Type:MAC  Level of Consciousness: awake  Airway and Oxygen Therapy: Patient Spontanous Breathing  Post-op Pain: none  Post-op Assessment: Post-op Vital signs reviewed, Patient's Cardiovascular Status Stable, Respiratory Function Stable, Patent Airway, No signs of Nausea or vomiting and Pain level controlled              Post-op Vital Signs: Reviewed and stable  Last Vitals:  Filed Vitals:   12/31/14 1400  BP: 119/82  Pulse: 62  Temp:   Resp: 14    Complications: No apparent anesthesia complications

## 2014-12-31 NOTE — Transfer of Care (Signed)
Immediate Anesthesia Transfer of Care Note  Patient: Derek [name]  Procedure(s) Performed: Procedure(s): UPPER ENDOSCOPIC ULTRASOUND (EUS) LINEAR (N/A)  Patient Location: PACU  Anesthesia Type:MAC  Level of Consciousness:  sedated, patient cooperative and responds to stimulation  Airway & Oxygen Therapy:Patient Spontanous Breathing and Patient connected to face mask oxgen  Post-op Assessment:  Report given to PACU RN and Post -op Vital signs reviewed and stable  Post vital signs:  Reviewed and stable  Last Vitals:  Filed Vitals:   12/31/14 1211  BP: 130/73  Pulse: 55  Temp: 36.4 C  Resp: 15    Complications: No apparent anesthesia complications

## 2014-12-31 NOTE — Anesthesia Preprocedure Evaluation (Signed)
Anesthesia Evaluation  Patient identified by MRN, date of birth, ID band Patient awake    Reviewed: Allergy & Precautions, NPO status , Patient's Chart, lab work & pertinent test results  History of Anesthesia Complications (+) PONV and history of anesthetic complications  Airway Mallampati: II  TM Distance: >3 FB Neck ROM: Full    Dental  (+) Teeth Intact   Pulmonary neg shortness of breath, sleep apnea , neg COPD, neg recent URI, former smoker, neg PE   breath sounds clear to auscultation       Cardiovascular hypertension, Pt. on medications and Pt. on home beta blockers  Rhythm:Regular     Neuro/Psych PSYCHIATRIC DISORDERS Anxiety Depression negative neurological ROS     GI/Hepatic Neg liver ROS, GERD  ,  Endo/Other  diabetes, Type 2, Oral Hypoglycemic AgentsMorbid obesity  Renal/GU negative Renal ROS     Musculoskeletal   Abdominal   Peds  Hematology   Anesthesia Other Findings   Reproductive/Obstetrics                             Anesthesia Physical Anesthesia Plan  ASA: III  Anesthesia Plan: MAC   Post-op Pain Management:    Induction: Intravenous  Airway Management Planned: Nasal Cannula, Natural Airway and Simple Face Mask  Additional Equipment: None  Intra-op Plan:   Post-operative Plan:   Informed Consent: I have reviewed the patients History and Physical, chart, labs and discussed the procedure including the risks, benefits and alternatives for the proposed anesthesia with the patient or authorized representative who has indicated his/her understanding and acceptance.   Dental advisory given  Plan Discussed with: CRNA and Surgeon  Anesthesia Plan Comments:         Anesthesia Quick Evaluation

## 2014-12-31 NOTE — H&P (Signed)
HPI: This is a 69 yo man with abnormal pancreas on [place] and CT  Chief complaint is abnormal pancreas on [place] and CT, probably asymptomatic.  No personal or family history of pancreatic disease.  No signif etoh history.  + weight loss gradual since starting new DM medicine   Past Medical History  Diagnosis Date  . Hypertension   . Diabetes mellitus   . GERD (gastroesophageal reflux disease)   . Meniere's disease   . Morbid obesity ([place])   . Depression   . Arthritis   . 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 Obstructive sleep apnea     pt states not now-did used to use one  . PONV (postoperative nausea and vomiting)   . Anxiety     prone to "panic attacks"  . 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 History of kidney stones     multiple kidney stones-not a problem at present  . Kidney stone   . Dermatitis     bilateral hands  . Fissure, anal   . BPH (benign prostatic hyperplasia)     Past Surgical History  Procedure Laterality Date  . Knee arthroscopy Right 2002    rt knee  . Rotator cuff repair  '93bilateral '94 lt    x3  . Umbilical hernia repair  2000  . Knee arthroscopy  03/01/2012    Procedure: ARTHROSCOPY KNEE;  Surgeon: Gearlean Alf, MD;  Location: [facility];  Service: Orthopedics;  Laterality: Left;  WITH SYNOVECTOMY   . Ankle surgery Left 2010    left-fx- some retained hardware as of 05-22-13  . Hardware removal Left 2013    ankle-lt  . Anal fistulectomy N/A 04/01/2013    Procedure: EXAM UNDER ANESTHESIA WITH ANAL FISSURectomy, sphincterotomy and internal hemorrhoidectomy;  Surgeon: Harl Bowie, MD;  Location: [facility];  Service: General;  Laterality: N/A;  . Replacement total knee Left 2009    knee-left  . Total knee arthroplasty Right 06/02/2013    Procedure: RIGHT TOTAL KNEE ARTHROPLASTY;  Surgeon: Gearlean Alf, MD;  Location: [facility];   Service: Orthopedics;  Laterality: Right;  . Colonoscopy    . Upper gastrointestinal endoscopy  sept 2016  . Inguinal hernia repair  '74,'82    x 2, inguinal    Current Facility-Administered Medications  Medication Dose Route Frequency Provider Last Rate Last Dose  . 0.9 %  sodium chloride infusion   Intravenous Continuous Milus Banister, MD      . lactated ringers infusion   Intravenous Continuous Milus Banister, MD 50 mL/hr at 12/31/14 1224 1,000 mL at 12/31/14 1224    Allergies as of 12/24/2014 - Review Complete 10/23/2014  Allergen Reaction Noted  . Morphine and related Nausea And Vomiting 03/01/2012  . Oxycontin [oxycodone hcl] Swelling 03/01/2012  . Quinine Other (See Comments) 10/08/2009  . Voltaren [diclofenac sodium] Other (See Comments) 09/24/2014    Family History  Problem Relation Age of Onset  . Factor V Leiden deficiency Daughter   . Diabetes Maternal Grandmother   . Heart disease Paternal Uncle   . Bone cancer Father     Jaw  . Colon cancer Neg Hx   . Esophageal cancer Neg Hx   . Stomach cancer Neg Hx   . Rectal cancer Neg Hx     Social History   Social History  .  Marital Status: Married    Spouse Name: N/A  . Number of Children: 2  . Years of Education: N/A   Occupational History  . retired     Social History Main Topics  . Smoking status: Former Smoker -- 4.00 packs/day for 17 years    Types: Cigarettes    Quit date: 02/21/1975  . Smokeless tobacco: Never Used  . Alcohol Use: No  . Drug Use: No  . Sexual Activity: Yes   Other Topics Concern  . Not on file   Social History Narrative   Married, retired Scientist, product/process development   1 son 1 daughter   2 caffeine drinks/day   10/18/2014        Physical Exam: BP 130/73 mmHg  Pulse 55  Temp(Src) 97.6 F (36.4 C)  Resp 15  SpO2 99% Constitutional: generally well-appearing Psychiatric: alert and oriented x3 Abdomen: soft, nontender, nondistended, no obvious ascites, no peritoneal signs, normal bowel  sounds   Assessment and plan: 69 y.o. male with abnormal pancreas  For upper EUS today   Owens Loffler, MD [facility] 12/31/2014, 12:26 PM

## 2014-12-31 NOTE — Discharge Instructions (Signed)
YOU HAD AN ENDOSCOPIC PROCEDURE TODAY: Refer to the procedure report that was given to you for any specific questions about what was found during the examination.  If the procedure report does not answer your questions, please call your gastroenterologist to clarify.  YOU SHOULD EXPECT: Some feelings of bloating in the abdomen. Passage of more gas than usual.  Walking can help get rid of the air that was put into your GI tract during the procedure and reduce the bloating. If you had a lower endoscopy (such as a colonoscopy or flexible sigmoidoscopy) you may notice spotting of blood in your stool or on the toilet paper.   DIET: Your first meal following the procedure should be a light meal and then it is ok to progress to your normal diet.  A half-sandwich or bowl of soup is an example of a good first meal.  Heavy or fried foods are harder to digest and may make you feel nasueas or bloated.  Drink plenty of fluids but you should avoid alcoholic beverages for 24 hours.  ACTIVITY: Your care partner should take you home directly after the procedure.  You should plan to take it easy, moving slowly for the rest of the day.  You can resume normal activity the day after the procedure however you should NOT DRIVE or use heavy machinery for 24 hours (because of the sedation medicines used during the test).    SYMPTOMS TO REPORT IMMEDIATELY  A gastroenterologist can be reached at any hour.  Please call your doctor's office for any of the following symptoms:   Following lower endoscopy (colonoscopy, flexible sigmoidoscopy)  Excessive amounts of blood in the stool  Significant tenderness, worsening of abdominal pains  Swelling of the abdomen that is new, acute  Fever of 100 or higher  Following upper endoscopy (EGD, EUS, ERCP)  Vomiting of blood or coffee ground material  New, significant abdominal pain  New, significant chest pain or pain under the shoulder blades  Painful or persistently difficult  swallowing  New shortness of breath  Black, tarry-looking stools  FOLLOW UP: If any biopsies were taken you will be contacted by phone or by letter within the next 1-3 weeks.  Call your gastroenterologist if you have not heard about the biopsies in 3 weeks.  Please also call your gastroenterologist's office with any specific questions about appointments or follow up tests.   Moderate Conscious Sedation, Adult, Care After Refer to this sheet in the next few weeks. These instructions provide you with information on caring for yourself after your procedure. Your health care provider may also give you more specific instructions. Your treatment has been planned according to current medical practices, but problems sometimes occur. Call your health care provider if you have any problems or questions after your procedure. WHAT TO EXPECT AFTER THE PROCEDURE  After your procedure:  You may feel sleepy, clumsy, and have poor balance for several hours.  Vomiting may occur if you eat too soon after the procedure. HOME CARE INSTRUCTIONS  Do not participate in any activities where you could become injured for at least 24 hours. Do not:  Drive.  Swim.  Ride a bicycle.  Operate heavy machinery.  Cook.  Use power tools.  Climb ladders.  Work from a high place.  Do not make important decisions or sign legal documents until you are improved.  If you vomit, drink water, juice, or soup when you can drink without vomiting. Make sure you have little or no nausea  before eating solid foods.  Only take over-the-counter or prescription medicines for pain, discomfort, or fever as directed by your health care provider.  Make sure you and your family fully understand everything about the medicines given to you, including what side effects may occur.  You should not drink alcohol, take sleeping pills, or take medicines that cause drowsiness for at least 24 hours.  If you smoke, do not smoke without  supervision.  If you are feeling better, you may resume normal activities 24 hours after you were sedated.  Keep all appointments with your health care provider. SEEK MEDICAL CARE IF:  Your skin is pale or bluish in color.  You continue to feel nauseous or vomit.  Your pain is getting worse and is not helped by medicine.  You have bleeding or swelling.  You are still sleepy or feeling clumsy after 24 hours. SEEK IMMEDIATE MEDICAL CARE IF:  You develop a rash.  You have difficulty breathing.  You develop any type of allergic problem.  You have a fever. MAKE SURE YOU:  Understand these instructions.  Will watch your condition.  Will get help right away if you are not doing well or get worse.   This information is not intended to replace advice given to you by your health care provider. Make sure you discuss any questions you have with your health care provider.   Document Released: 11/27/2012 Document Revised: 02/27/2014 Document Reviewed: 11/27/2012 Elsevier Interactive Patient Education [employer].

## 2015-01-01 ENCOUNTER — Encounter (HOSPITAL_COMMUNITY): Payer: Self-pay | Admitting: Gastroenterology

## 2015-01-01 LAB — GLUCOSE, CAPILLARY: Glucose-Capillary: 161 mg/dL — ABNORMAL HIGH (ref 65–99)

## 2015-01-08 ENCOUNTER — Ambulatory Visit: Payer: PPO | Admitting: Internal Medicine

## 2015-02-08 ENCOUNTER — Ambulatory Visit: Payer: PPO | Admitting: Internal Medicine

## 2015-02-10 ENCOUNTER — Ambulatory Visit: Payer: Self-pay | Admitting: Surgery

## 2015-02-17 NOTE — Patient Instructions (Addendum)
YOUR PROCEDURE IS SCHEDULED ON :  02/25/15  REPORT TO [facility] MAIN ENTRANCE FOLLOW SIGNS TO EAST ELEVATOR - GO TO 3rd FLOOR CHECK IN AT 3 EAST NURSES STATION (SHORT STAY) AT: 5:30 AM  CALL THIS NUMBER IF YOU HAVE PROBLEMS THE MORNING OF SURGERY [phone]  REMEMBER:ONLY 1 PER PERSON MAY GO TO SHORT STAY WITH YOU TO GET READY THE MORNING OF YOUR SURGERY  DO NOT EAT FOOD OR DRINK LIQUIDS AFTER MIDNIGHT  TAKE THESE MEDICINES THE MORNING OF SURGERY: CARVEDILOL  STOP ASPIRIN / IBUPROFEN / ALEVE / VITAMINS / HERBAL MEDS __5__ DAYS BEFORE SURGERY  YOU MAY NOT HAVE ANY METAL ON YOUR BODY INCLUDING HAIR PINS AND PIERCING'S. DO NOT WEAR JEWELRY, MAKEUP, LOTIONS, POWDERS OR PERFUMES. DO NOT WEAR NAIL POLISH. DO NOT SHAVE 48 HRS PRIOR TO SURGERY. MEN MAY SHAVE FACE AND NECK.  DO NOT [facility]. [facility] IS NOT RESPONSIBLE FOR VALUABLES.  CONTACTS, DENTURES OR PARTIALS MAY NOT BE WORN TO SURGERY. LEAVE SUITCASE IN CAR. CAN BE BROUGHT TO ROOM AFTER SURGERY.  PATIENTS DISCHARGED THE DAY OF SURGERY WILL NOT BE ALLOWED TO DRIVE HOME.  PLEASE READ OVER THE FOLLOWING INSTRUCTION SHEETS _________________________________________________________________________________                                          [facility] - PREPARING FOR SURGERY  Before surgery, you can play an important role.  Because skin is not sterile, your skin needs to be as free of germs as possible.  You can reduce the number of germs on your skin by washing with CHG (chlorahexidine gluconate) soap before surgery.  CHG is an antiseptic cleaner which kills germs and bonds with the skin to continue killing germs even after washing. Please DO NOT use if you have an allergy to CHG or antibacterial soaps.  If your skin becomes reddened/irritated stop using the CHG and inform your nurse when you arrive at Short Stay. Do not shave (including legs and underarms) for at least 48 hours prior to the  first CHG shower.  You may shave your face. Please follow these instructions carefully:   1.  Shower with CHG Soap the night before surgery and the  morning of Surgery.   2.  If you choose to wash your hair, wash your hair first as usual with your  normal  Shampoo.   3.  After you shampoo, rinse your hair and body thoroughly to remove the  shampoo.                                         4.  Use CHG as you would any other liquid soap.  You can apply chg directly  to the skin and wash . Gently wash with scrungie or clean wascloth    5.  Apply the CHG Soap to your body ONLY FROM THE NECK DOWN.   Do not use on open                           Wound or open sores. Avoid contact with eyes, ears mouth and genitals (private parts).  Genitals (private parts) with your normal soap.              6.  Wash thoroughly, paying special attention to the area where your surgery  will be performed.   7.  Thoroughly rinse your body with warm water from the neck down.   8.  DO NOT shower/wash with your normal soap after using and rinsing off  the CHG Soap .                9.  Pat yourself dry with a clean towel.             10.  Wear clean night clothes to bed after shower             11.  Place clean sheets on your bed the night of your first shower and do not  sleep with pets.  Day of Surgery : Do not apply any lotions/deodorants the morning of surgery.  Please wear clean clothes to the hospital/surgery center.  FAILURE TO FOLLOW THESE INSTRUCTIONS MAY RESULT IN THE CANCELLATION OF YOUR SURGERY    PATIENT SIGNATURE_________________________________  ______________________________________________________________________

## 2015-02-19 ENCOUNTER — Encounter (HOSPITAL_COMMUNITY)
Admission: RE | Admit: 2015-02-19 | Discharge: 2015-02-19 | Disposition: A | Discharge status: Home / Self Care | Payer: PPO | Source: Ambulatory Visit | Attending: Surgery | Admitting: Surgery

## 2015-02-19 ENCOUNTER — Encounter (HOSPITAL_COMMUNITY): Payer: Self-pay

## 2015-02-19 DIAGNOSIS — C7A8 Other malignant neuroendocrine tumors: Secondary | ICD-10-CM | POA: Insufficient documentation

## 2015-02-19 DIAGNOSIS — Z01810 Encounter for preprocedural cardiovascular examination: Secondary | ICD-10-CM | POA: Insufficient documentation

## 2015-02-19 DIAGNOSIS — Z01812 Encounter for preprocedural laboratory examination: Secondary | ICD-10-CM | POA: Diagnosis not present

## 2015-02-19 HISTORY — DX: Other intervertebral disc degeneration, lumbar region without mention of lumbar back pain or lower extremity pain: M51.369

## 2015-02-19 HISTORY — DX: Other intervertebral disc degeneration, lumbar region: M51.36

## 2015-02-19 HISTORY — DX: Siderosis: J63.4

## 2015-02-19 LAB — PROTIME-INR
INR: 1.07 (ref 0.00–1.49)
Prothrombin Time: 14.1 seconds (ref 11.6–15.2)

## 2015-02-19 LAB — COMPREHENSIVE METABOLIC PANEL
ALT: 23 U/L (ref 17–63)
AST: 19 U/L (ref 15–41)
Albumin: 4.2 g/dL (ref 3.5–5.0)
Alkaline Phosphatase: 83 U/L (ref 38–126)
Anion gap: 8 (ref 5–15)
BUN: 25 mg/dL — ABNORMAL HIGH (ref 6–20)
CO2: 24 mmol/L (ref 22–32)
Calcium: 10.8 mg/dL — ABNORMAL HIGH (ref 8.9–10.3)
Chloride: 107 mmol/L (ref 101–111)
Creatinine, Ser: 0.93 mg/dL (ref 0.61–1.24)
GFR calc Af Amer: >60 mL/min (ref >60)
GFR calc non Af Amer: >60 mL/min (ref >60)
Glucose, Bld: 161 mg/dL — ABNORMAL HIGH (ref 65–99)
Potassium: 4.7 mmol/L (ref 3.5–5.1)
Sodium: 139 mmol/L (ref 135–145)
Total Bilirubin: 0.6 mg/dL (ref 0.3–1.2)
Total Protein: 6.9 g/dL (ref 6.5–8.1)

## 2015-02-19 LAB — CBC
HCT: 39.9 % (ref 39.0–52.0)
Hemoglobin: 12.5 g/dL — ABNORMAL LOW (ref 13.0–17.0)
MCH: 23.9 pg — ABNORMAL LOW (ref 26.0–34.0)
MCHC: 31.3 g/dL (ref 30.0–36.0)
MCV: 76.4 fL — ABNORMAL LOW (ref 78.0–100.0)
Platelets: 200 10*3/uL (ref 150–400)
RBC: 5.22 MIL/uL (ref 4.22–5.81)
RDW: 14.5 % (ref 11.5–15.5)
WBC: 4.3 10*3/uL (ref 4.0–10.5)

## 2015-02-19 LAB — SURGICAL PCR SCREEN
MRSA, PCR: POSITIVE — AB
Staphylococcus aureus: POSITIVE — AB

## 2015-02-19 LAB — PREPARE RBC (CROSSMATCH)

## 2015-02-19 LAB — APTT: aPTT: 28 seconds (ref 24–37)

## 2015-02-19 NOTE — Progress Notes (Signed)
   02/19/15 0837  OBSTRUCTIVE SLEEP APNEA  Have you ever been diagnosed with sleep apnea through a sleep study? No  Do you snore loudly (loud enough to be heard through closed doors)?  0  Do you often feel tired, fatigued, or sleepy during the daytime (such as falling asleep during driving or talking to someone)? 1  Has anyone observed you stop breathing during your sleep? 0  Do you have, or are you being treated for high blood pressure? 1  BMI more than 35 kg/m2? 1  Age > 50 (1-yes) 1  Neck circumference greater than:Male 16 inches or larger, Male 17inches or larger? 1  Male Gender (Yes=1) 1  Obstructive Sleep Apnea Score 6

## 2015-02-19 NOTE — Progress Notes (Signed)
MESSAGE LEFT ON PT ANS MACHINE CONCERNING +PCR AND INFORMED OF RX MUPURICIN CALLED TO [facility]. MESSAGE LEFT FOR PHARMACIST FOR MUPURICIN [id number]

## 2015-02-20 ENCOUNTER — Other Ambulatory Visit: Payer: Self-pay | Admitting: General Surgery

## 2015-02-20 DIAGNOSIS — Z22322 Carrier or suspected carrier of Methicillin resistant Staphylococcus aureus: Secondary | ICD-10-CM

## 2015-02-20 LAB — HEMOGLOBIN A1C
Hgb A1c MFr Bld: 10.4 % — ABNORMAL HIGH (ref 4.8–5.6)
Mean Plasma Glucose: 252 mg/dL

## 2015-02-20 MED ORDER — MUPIROCIN 2 % EX OINT: TOPICAL_OINTMENT | CUTANEOUS | Status: DC

## 2015-02-24 ENCOUNTER — Encounter (HOSPITAL_COMMUNITY): Payer: Self-pay | Admitting: Surgery

## 2015-02-24 DIAGNOSIS — D3A8 Other benign neuroendocrine tumors: Secondary | ICD-10-CM | POA: Diagnosis present

## 2015-02-24 MED ORDER — SODIUM CHLORIDE 0.9 % IV SOLN
1.0000 g | INTRAVENOUS | Status: AC
  Administered 2015-02-25: 1 g via INTRAVENOUS
  Filled 2015-02-24 (×2): qty 1

## 2015-02-24 NOTE — H&P (Signed)
General Surgery [facility]  Derek [name] DOB: 24-Sep-1945 Married / Language: English / Race: White Male  History of Present Illness  The patient is a 70 year old male who presents with a pancreatic mass.  Patient is referred by Dr. 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 Patient had originally presented to his primary care physician with lower abdominal pain in August 2016. Patient subsequently underwent evaluation with abdominal ultrasound. This showed a hypoechoic lesion in the body of the pancreas. CT scan confirmed a 2 cm mass in the mid body of the pancreas. Further evaluation included an upper endoscopy with endoscopic ultrasound and biopsy. This was performed on December 31, 2014. Fine-needle aspiration showed a neuroendocrine neoplasm. Patient has a history of gastroesophageal reflux. He has a small hiatal hernia. He has had no upper abdominal surgery. He has had an umbilical hernia repair as well as right inguinal hernia repairs (2). Patient has had multiple orthopedic procedures. He does have type 2 diabetes. He has hypertension. He has no history of cardiac disease. Patient presents today to consider surgical resection.  Other Problems Anxiety Disorder Back Pain Gastroesophageal Reflux Disease Hemorrhoids High blood pressure Inguinal Hernia Umbilical Hernia Repair  Past Surgical History Anal Fissure Repair Foot Surgery Left. Hemorrhoidectomy Knee Surgery Bilateral. Open Inguinal Hernia Surgery Bilateral. Shoulder Surgery Bilateral.  Diagnostic Studies History Colonoscopy 1-5 years ago  Allergies Morphine Sulfate *ANALGESICS - OPIOID* OxyCONTIN *ANALGESICS - OPIOID* QuiNINE Sulfate *ANTIMALARIALS* Voltaren-XR *ANALGESICS - ANTI-INFLAMMATORY*  Medication History Carvedilol (25MG  Tablet, Oral) Active. Clobetasol Propionate (0.05% Ointment, External) Active. Glimepiride (4MG   Tablet, Oral) Active. Invokana (100MG  Tablet, Oral) Active. Losartan Potassium (50MG  Tablet, Oral) Active. MetFORMIN HCl ER (500MG  Tablet ER 24HR, Oral) Active. Aspirin (81MG  Tablet Chewable, Oral) Active. Naproxen Sodium (220MG  Capsule, Oral) Active. Fluconazole (100MG  Tablet, Oral) Active. Hydrocodone-Acetaminophen (7.5-325MG  Tablet, Oral) Active. Medications Reconciled  Social History Alcohol use Remotely quit alcohol use. No caffeine use Tobacco use Former smoker.  Family History Cancer Father  Review of Systems General Present- Fatigue and Weight Loss. Not Present- Appetite Loss, Chills, Fever, Night Sweats and Weight Gain. Skin Not Present- Change in Wart/Mole, Dryness, Hives, Jaundice, New Lesions, Non-Healing Wounds, Rash and Ulcer. HEENT Present- Hearing Loss, Ringing in the Ears, Visual Disturbances and Wears glasses/contact lenses. Not Present- Earache, Hoarseness, Nose Bleed, Oral Ulcers, Seasonal Allergies, Sinus Pain, Sore Throat and Yellow Eyes. Respiratory Present- Snoring. Not Present- Bloody sputum, Chronic Cough, Difficulty Breathing and Wheezing. Cardiovascular Present- Leg Cramps and Shortness of Breath. Not Present- Chest Pain, Difficulty Breathing Lying Down, Palpitations, Rapid Heart Rate and Swelling of Extremities. Gastrointestinal Present- Abdominal Pain. Not Present- Bloating, Bloody Stool, Change in Bowel Habits, Chronic diarrhea, Constipation, Difficulty Swallowing, Excessive gas, Gets full quickly at meals, Hemorrhoids, Indigestion, Nausea, Rectal Pain and Vomiting. Male Genitourinary Present- Frequency and Urgency. Not Present- Blood in Urine, Change in Urinary Stream, Impotence, Nocturia, Painful Urination and Urine Leakage. Musculoskeletal Present- Back Pain, Joint Pain and Muscle Weakness. Not Present- Joint Stiffness, Muscle Pain and Swelling of Extremities. Neurological Present- Decreased Memory, Numbness, Tingling and Weakness. Not Present-  Fainting, Headaches, Seizures, Tremor and Trouble walking. Psychiatric Present- Anxiety and Depression. Not Present- Bipolar, Change in Sleep Pattern, Fearful and Frequent crying. Hematology Present- Easy Bruising. Not Present- Excessive bleeding, Gland problems, HIV and Persistent Infections.   Vitals Weight: 260 lb Height: 73in Body Surface Area: 2.41 m Body Mass Index: 34.3 kg/m  Temp.: 97.6F  Pulse: 64 (Regular)  BP: 138/80 (  Sitting, Left Arm, Standard)   Physical Exam  General - appears comfortable, no distress; not diaphorectic  HEENT - normocephalic; sclerae clear, gaze conjugate; mucous membranes moist, dentition good; voice normal  Neck - symmetric on extension; no palpable anterior or posterior cervical adenopathy; no palpable masses in the thyroid bed  Chest - clear bilaterally without rhonchi, rales, or wheeze  Cor - regular rhythm with normal rate; no significant murmur  Abd - soft without distension; well-healed incision at umbilicus without sign of recurrent hernia; upper abdomen soft and nontender with no sign of hepatosplenomegaly.  Ext - non-tender without significant edema or lymphedema  Neuro - grossly intact; no tremor   Assessment & Plan  NEUROENDOCRINE TUMOR OF PANCREAS (C7A.8)  Patient presents with a newly diagnosed neuroendocrine tumor of the pancreas. This appears to be in the body of the pancreas and measures approximately 2 cm in size. There is no sign of metastatic disease.  I discussed indications for surgery with the patient and his wife. I believe he is a good candidate for distal pancreatectomy and splenectomy. We discussed the risk and benefits of the procedure. We discussed laparoscopic versus open technique. I favor open technique with a left subcostal incision. We discussed the hospital stay to be anticipated and his postoperative recovery and return to activities. We discussed the primary risk of the surgery being bleeding.  Patient will also be at increased risk following surgery for infections related to his splenectomy. We will ask his primary care physician to administer vaccinations for Haemophilus influenza, neisseria meningitidis, and pneumococcus prior to surgery. Patient and his wife understand the above and agreed to proceed with surgery in the near future. They request [facility].  The risks and benefits of the procedure have been discussed at length with the patient. The patient understands the proposed procedure, potential alternative treatments, and the course of recovery to be expected. All of the patient's questions have been answered at this time. The patient wishes to proceed with surgery.  [name], MD, [facility] Office: [phone]

## 2015-02-25 ENCOUNTER — Inpatient Hospital Stay (HOSPITAL_COMMUNITY)
Admission: RE | Admit: 2015-02-25 | Discharge: 2015-03-03 | DRG: 827 | Disposition: A | Discharge status: Home / Self Care | Payer: PPO | Source: Ambulatory Visit | Attending: Surgery | Admitting: Surgery

## 2015-02-25 ENCOUNTER — Encounter (HOSPITAL_COMMUNITY): Payer: Self-pay | Admitting: *Deleted

## 2015-02-25 ENCOUNTER — Inpatient Hospital Stay (HOSPITAL_COMMUNITY): Payer: PPO

## 2015-02-25 ENCOUNTER — Inpatient Hospital Stay (HOSPITAL_COMMUNITY): Payer: PPO | Admitting: Certified Registered Nurse Anesthetist

## 2015-02-25 ENCOUNTER — Encounter (HOSPITAL_COMMUNITY)
Admission: RE | Disposition: A | Discharge status: Home / Self Care | Payer: Self-pay | Source: Ambulatory Visit | Attending: Surgery

## 2015-02-25 DIAGNOSIS — I10 Essential (primary) hypertension: Secondary | ICD-10-CM | POA: Diagnosis present

## 2015-02-25 DIAGNOSIS — Z888 Allergy status to other drugs, medicaments and biological substances status: Secondary | ICD-10-CM | POA: Diagnosis not present

## 2015-02-25 DIAGNOSIS — Z79899 Other long term (current) drug therapy: Secondary | ICD-10-CM | POA: Diagnosis not present

## 2015-02-25 DIAGNOSIS — H9191 Unspecified hearing loss, right ear: Secondary | ICD-10-CM | POA: Diagnosis not present

## 2015-02-25 DIAGNOSIS — G4733 Obstructive sleep apnea (adult) (pediatric): Secondary | ICD-10-CM | POA: Diagnosis present

## 2015-02-25 DIAGNOSIS — K649 Unspecified hemorrhoids: Secondary | ICD-10-CM | POA: Diagnosis not present

## 2015-02-25 DIAGNOSIS — Z6833 Body mass index (BMI) 33.0-33.9, adult: Secondary | ICD-10-CM

## 2015-02-25 DIAGNOSIS — R05 Cough: Secondary | ICD-10-CM | POA: Diagnosis present

## 2015-02-25 DIAGNOSIS — E119 Type 2 diabetes mellitus without complications: Secondary | ICD-10-CM | POA: Diagnosis not present

## 2015-02-25 DIAGNOSIS — D3A8 Other benign neuroendocrine tumors: Secondary | ICD-10-CM | POA: Diagnosis not present

## 2015-02-25 DIAGNOSIS — E872 Acidosis: Secondary | ICD-10-CM | POA: Diagnosis not present

## 2015-02-25 DIAGNOSIS — C7A8 Other malignant neuroendocrine tumors: Secondary | ICD-10-CM

## 2015-02-25 DIAGNOSIS — J9602 Acute respiratory failure with hypercapnia: Secondary | ICD-10-CM | POA: Insufficient documentation

## 2015-02-25 DIAGNOSIS — K4090 Unilateral inguinal hernia, without obstruction or gangrene, not specified as recurrent: Secondary | ICD-10-CM | POA: Diagnosis not present

## 2015-02-25 DIAGNOSIS — Z885 Allergy status to narcotic agent status: Secondary | ICD-10-CM | POA: Diagnosis not present

## 2015-02-25 DIAGNOSIS — Z96651 Presence of right artificial knee joint: Secondary | ICD-10-CM | POA: Diagnosis present

## 2015-02-25 DIAGNOSIS — R053 Chronic cough: Secondary | ICD-10-CM | POA: Diagnosis present

## 2015-02-25 DIAGNOSIS — Z7984 Long term (current) use of oral hypoglycemic drugs: Secondary | ICD-10-CM

## 2015-02-25 DIAGNOSIS — J9811 Atelectasis: Secondary | ICD-10-CM | POA: Diagnosis not present

## 2015-02-25 DIAGNOSIS — Z7982 Long term (current) use of aspirin: Secondary | ICD-10-CM | POA: Diagnosis not present

## 2015-02-25 DIAGNOSIS — K219 Gastro-esophageal reflux disease without esophagitis: Secondary | ICD-10-CM | POA: Diagnosis present

## 2015-02-25 DIAGNOSIS — E1165 Type 2 diabetes mellitus with hyperglycemia: Secondary | ICD-10-CM | POA: Diagnosis not present

## 2015-02-25 DIAGNOSIS — K869 Disease of pancreas, unspecified: Secondary | ICD-10-CM | POA: Diagnosis not present

## 2015-02-25 DIAGNOSIS — F419 Anxiety disorder, unspecified: Secondary | ICD-10-CM | POA: Diagnosis not present

## 2015-02-25 DIAGNOSIS — D137 Benign neoplasm of endocrine pancreas: Secondary | ICD-10-CM | POA: Diagnosis not present

## 2015-02-25 LAB — CBC
HCT: 41.7 % (ref 39.0–52.0)
Hemoglobin: 13 g/dL (ref 13.0–17.0)
MCH: 24 pg — ABNORMAL LOW (ref 26.0–34.0)
MCHC: 31.2 g/dL (ref 30.0–36.0)
MCV: 76.9 fL — ABNORMAL LOW (ref 78.0–100.0)
Platelets: 288 10*3/uL (ref 150–400)
RBC: 5.42 MIL/uL (ref 4.22–5.81)
RDW: 14.7 % (ref 11.5–15.5)
WBC: 13.7 10*3/uL — ABNORMAL HIGH (ref 4.0–10.5)

## 2015-02-25 LAB — GLUCOSE, CAPILLARY
Glucose-Capillary: 178 mg/dL — ABNORMAL HIGH (ref 65–99)
Glucose-Capillary: 267 mg/dL — ABNORMAL HIGH (ref 65–99)
Glucose-Capillary: 215 mg/dL — ABNORMAL HIGH (ref 65–99)
Glucose-Capillary: 231 mg/dL — ABNORMAL HIGH (ref 65–99)
Glucose-Capillary: 126 mg/dL — ABNORMAL HIGH (ref 65–99)
Glucose-Capillary: 146 mg/dL — ABNORMAL HIGH (ref 65–99)
Glucose-Capillary: 136 mg/dL — ABNORMAL HIGH (ref 65–99)
Glucose-Capillary: 119 mg/dL — ABNORMAL HIGH (ref 65–99)

## 2015-02-25 LAB — BLOOD GAS, ARTERIAL
Acid-base deficit: 4 mmol/L — ABNORMAL HIGH (ref 0.0–2.0)
Bicarbonate: 21.7 mEq/L (ref 20.0–24.0)
Delivery systems: POSITIVE
Drawn by: 244901
Expiratory PAP: 13
O2 Content: 5 L/min
O2 Saturation: 91.4 %
Patient temperature: 97.4
TCO2: 19.8 mmol/L (ref 0–100)
pCO2 arterial: 42.9 mmHg (ref 35.0–45.0)
pH, Arterial: 7.319 — ABNORMAL LOW (ref 7.350–7.450)
pO2, Arterial: 68.7 mmHg — ABNORMAL LOW (ref 80.0–100.0)

## 2015-02-25 LAB — CREATININE, SERUM
Creatinine, Ser: 1.14 mg/dL (ref 0.61–1.24)
GFR calc Af Amer: >60 mL/min (ref >60)
GFR calc non Af Amer: >60 mL/min (ref >60)

## 2015-02-25 SURGERY — PANCREATECTOMY
Anesthesia: General | Site: Abdomen

## 2015-02-25 MED ORDER — INSULIN ASPART 100 UNIT/ML ~~LOC~~ SOLN
SUBCUTANEOUS | Status: DC | PRN
  Administered 2015-02-25: 10 [IU] via SUBCUTANEOUS

## 2015-02-25 MED ORDER — KETOROLAC TROMETHAMINE 30 MG/ML IJ SOLN
INTRAMUSCULAR | Status: AC
  Filled 2015-02-25: qty 1

## 2015-02-25 MED ORDER — DIPHENHYDRAMINE HCL 50 MG/ML IJ SOLN: 12.5000 mg | Freq: Four times a day (QID) | INTRAMUSCULAR | Status: DC | PRN

## 2015-02-25 MED ORDER — FENTANYL CITRATE (PF) 250 MCG/5ML IJ SOLN
INTRAMUSCULAR | Status: AC
  Filled 2015-02-25: qty 5

## 2015-02-25 MED ORDER — INSULIN ASPART 100 UNIT/ML ~~LOC~~ SOLN
SUBCUTANEOUS | Status: AC
  Filled 2015-02-25: qty 1

## 2015-02-25 MED ORDER — LACTATED RINGERS IV SOLN
INTRAVENOUS | Status: DC | PRN
  Administered 2015-02-25: 07:00:00 via INTRAVENOUS

## 2015-02-25 MED ORDER — PROMETHAZINE HCL 25 MG/ML IJ SOLN
6.2500 mg | Freq: Once | INTRAMUSCULAR | Status: AC
  Administered 2015-02-25: 6.25 mg via INTRAVENOUS

## 2015-02-25 MED ORDER — DIPHENHYDRAMINE HCL 12.5 MG/5ML PO ELIX: 12.5000 mg | ORAL_SOLUTION | Freq: Four times a day (QID) | ORAL | Status: DC | PRN

## 2015-02-25 MED ORDER — MIDAZOLAM HCL 5 MG/5ML IJ SOLN
INTRAMUSCULAR | Status: DC | PRN
  Administered 2015-02-25: 2 mg via INTRAVENOUS

## 2015-02-25 MED ORDER — ONDANSETRON HCL 4 MG/2ML IJ SOLN
INTRAMUSCULAR | Status: AC
  Filled 2015-02-25: qty 2

## 2015-02-25 MED ORDER — LACTATED RINGERS IV SOLN
INTRAVENOUS | Status: DC | PRN
  Administered 2015-02-25 (×2): via INTRAVENOUS

## 2015-02-25 MED ORDER — ONDANSETRON 4 MG PO TBDP
4.0000 mg | ORAL_TABLET | Freq: Four times a day (QID) | ORAL | Status: DC | PRN
  Administered 2015-02-25: 4 mg via ORAL
  Filled 2015-02-25 (×2): qty 1

## 2015-02-25 MED ORDER — HYDROMORPHONE HCL 1 MG/ML IJ SOLN
0.2500 mg | INTRAMUSCULAR | Status: DC | PRN
  Administered 2015-02-25 (×4): 0.5 mg via INTRAVENOUS

## 2015-02-25 MED ORDER — METOPROLOL TARTRATE 1 MG/ML IV SOLN
2.5000 mg | Freq: Four times a day (QID) | INTRAVENOUS | Status: DC
  Administered 2015-02-25 – 2015-03-03 (×22): 2.5 mg via INTRAVENOUS
  Filled 2015-02-25 (×28): qty 5

## 2015-02-25 MED ORDER — HYDROCODONE-ACETAMINOPHEN 7.5-325 MG PO TABS: 1.0000 | ORAL_TABLET | Freq: Once | ORAL | Status: DC | PRN

## 2015-02-25 MED ORDER — FENTANYL 40 MCG/ML IV SOLN
INTRAVENOUS | Status: DC
  Administered 2015-02-25: 12:00:00 via INTRAVENOUS
  Filled 2015-02-25: qty 25

## 2015-02-25 MED ORDER — HYDROMORPHONE HCL 1 MG/ML IJ SOLN
INTRAMUSCULAR | Status: AC
  Filled 2015-02-25: qty 1

## 2015-02-25 MED ORDER — 0.9 % SODIUM CHLORIDE (POUR BTL) OPTIME
TOPICAL | Status: DC | PRN
  Administered 2015-02-25: 3000 mL

## 2015-02-25 MED ORDER — LACTATED RINGERS IV SOLN
INTRAVENOUS | Status: DC
  Administered 2015-02-25: 17:00:00 via INTRAVENOUS

## 2015-02-25 MED ORDER — ARTIFICIAL TEARS OP OINT
TOPICAL_OINTMENT | OPHTHALMIC | Status: DC | PRN
  Administered 2015-02-25: 1 via OPHTHALMIC

## 2015-02-25 MED ORDER — MUPIROCIN 2 % EX OINT
TOPICAL_OINTMENT | CUTANEOUS | Status: AC
  Administered 2015-02-25: 17:00:00
  Filled 2015-02-25: qty 22

## 2015-02-25 MED ORDER — HYDROMORPHONE HCL 1 MG/ML IJ SOLN
INTRAMUSCULAR | Status: DC | PRN
  Administered 2015-02-25 (×4): 0.5 mg via INTRAVENOUS

## 2015-02-25 MED ORDER — ONDANSETRON HCL 4 MG/2ML IJ SOLN
INTRAMUSCULAR | Status: DC | PRN
  Administered 2015-02-25: 4 mg via INTRAVENOUS

## 2015-02-25 MED ORDER — LIDOCAINE HCL (CARDIAC) 20 MG/ML IV SOLN
INTRAVENOUS | Status: DC | PRN
  Administered 2015-02-25: 100 mg via INTRAVENOUS

## 2015-02-25 MED ORDER — METOCLOPRAMIDE HCL 5 MG/ML IJ SOLN
INTRAMUSCULAR | Status: AC
  Filled 2015-02-25: qty 2

## 2015-02-25 MED ORDER — SUGAMMADEX SODIUM 200 MG/2ML IV SOLN
INTRAVENOUS | Status: AC
  Filled 2015-02-25: qty 2

## 2015-02-25 MED ORDER — INSULIN ASPART 100 UNIT/ML ~~LOC~~ SOLN
0.0000 [IU] | SUBCUTANEOUS | Status: DC
  Administered 2015-02-25 (×2): 2 [IU] via SUBCUTANEOUS
  Administered 2015-02-25: 5 [IU] via SUBCUTANEOUS
  Administered 2015-02-26: 2 [IU] via SUBCUTANEOUS
  Administered 2015-02-26: 3 [IU] via SUBCUTANEOUS
  Administered 2015-02-26: 2 [IU] via SUBCUTANEOUS
  Administered 2015-02-26: 3 [IU] via SUBCUTANEOUS
  Administered 2015-02-27 (×2): 5 [IU] via SUBCUTANEOUS

## 2015-02-25 MED ORDER — MUPIROCIN 2 % EX OINT
TOPICAL_OINTMENT | Freq: Three times a day (TID) | CUTANEOUS | Status: DC
  Administered 2015-02-25 (×2): via TOPICAL
  Filled 2015-02-25: qty 22

## 2015-02-25 MED ORDER — PROPOFOL 10 MG/ML IV BOLUS
INTRAVENOUS | Status: DC | PRN
  Administered 2015-02-25: 170 mg via INTRAVENOUS

## 2015-02-25 MED ORDER — METOCLOPRAMIDE HCL 5 MG/ML IJ SOLN
INTRAMUSCULAR | Status: DC | PRN
  Administered 2015-02-25: 10 mg via INTRAVENOUS

## 2015-02-25 MED ORDER — SUGAMMADEX SODIUM 200 MG/2ML IV SOLN
INTRAVENOUS | Status: DC | PRN
  Administered 2015-02-25: 200 mg via INTRAVENOUS

## 2015-02-25 MED ORDER — ONDANSETRON HCL 4 MG/2ML IJ SOLN
4.0000 mg | Freq: Four times a day (QID) | INTRAMUSCULAR | Status: DC | PRN
  Filled 2015-02-25: qty 2

## 2015-02-25 MED ORDER — PANTOPRAZOLE SODIUM 40 MG IV SOLR
40.0000 mg | Freq: Every day | INTRAVENOUS | Status: DC
Start: 2015-02-25 — End: 2015-03-03
  Administered 2015-02-25 – 2015-03-02 (×6): 40 mg via INTRAVENOUS
  Filled 2015-02-25 (×9): qty 40

## 2015-02-25 MED ORDER — KCL IN DEXTROSE-NACL 20-5-0.45 MEQ/L-%-% IV SOLN
INTRAVENOUS | Status: DC
Start: 2015-02-25 — End: 2015-02-26

## 2015-02-25 MED ORDER — FENTANYL CITRATE (PF) 100 MCG/2ML IJ SOLN
INTRAMUSCULAR | Status: DC | PRN
  Administered 2015-02-25 (×7): 50 ug via INTRAVENOUS

## 2015-02-25 MED ORDER — NALOXONE HCL 0.4 MG/ML IJ SOLN: 0.4000 mg | INTRAMUSCULAR | Status: DC | PRN

## 2015-02-25 MED ORDER — METOPROLOL TARTRATE 1 MG/ML IV SOLN
INTRAVENOUS | Status: AC
  Filled 2015-02-25: qty 5

## 2015-02-25 MED ORDER — ENOXAPARIN SODIUM 30 MG/0.3ML ~~LOC~~ SOLN
30.0000 mg | SUBCUTANEOUS | Status: DC
Start: 2015-02-26 — End: 2015-02-26

## 2015-02-25 MED ORDER — ONDANSETRON HCL 4 MG/2ML IJ SOLN: 4.0000 mg | Freq: Four times a day (QID) | INTRAMUSCULAR | Status: DC | PRN

## 2015-02-25 MED ORDER — LACTATED RINGERS IV SOLN
INTRAVENOUS | Status: DC
  Administered 2015-02-25: 14:00:00 via INTRAVENOUS

## 2015-02-25 MED ORDER — PROMETHAZINE HCL 25 MG/ML IJ SOLN
INTRAMUSCULAR | Status: AC
  Filled 2015-02-25: qty 1

## 2015-02-25 MED ORDER — MIDAZOLAM HCL 2 MG/2ML IJ SOLN
INTRAMUSCULAR | Status: AC
  Filled 2015-02-25: qty 2

## 2015-02-25 MED ORDER — ROCURONIUM BROMIDE 100 MG/10ML IV SOLN
INTRAVENOUS | Status: AC
  Filled 2015-02-25: qty 1

## 2015-02-25 MED ORDER — PHENYLEPHRINE HCL 10 MG/ML IJ SOLN
10.0000 mg | INTRAMUSCULAR | Status: DC | PRN
  Administered 2015-02-25: 50 ug/min via INTRAVENOUS

## 2015-02-25 MED ORDER — LIDOCAINE HCL (CARDIAC) 20 MG/ML IV SOLN
INTRAVENOUS | Status: AC
  Filled 2015-02-25: qty 5

## 2015-02-25 MED ORDER — FENTANYL CITRATE (PF) 100 MCG/2ML IJ SOLN
25.0000 ug | INTRAMUSCULAR | Status: DC | PRN
  Administered 2015-02-25: 25 ug via INTRAVENOUS
  Filled 2015-02-25: qty 2

## 2015-02-25 MED ORDER — SUCCINYLCHOLINE CHLORIDE 20 MG/ML IJ SOLN
INTRAMUSCULAR | Status: DC | PRN
  Administered 2015-02-25: 120 mg via INTRAVENOUS

## 2015-02-25 MED ORDER — FENTANYL CITRATE (PF) 100 MCG/2ML IJ SOLN
25.0000 ug | INTRAMUSCULAR | Status: DC | PRN
  Administered 2015-02-25 – 2015-02-26 (×6): 25 ug via INTRAVENOUS
  Filled 2015-02-25 (×6): qty 2

## 2015-02-25 MED ORDER — SODIUM CHLORIDE 0.9 % IJ SOLN
9.0000 mL | INTRAMUSCULAR | Status: DC | PRN
Start: 2015-02-25 — End: 2015-02-26

## 2015-02-25 MED ORDER — ROCURONIUM BROMIDE 100 MG/10ML IV SOLN
INTRAVENOUS | Status: DC | PRN
Start: 2015-02-25 — End: 2015-02-25
  Administered 2015-02-25: 10 mg via INTRAVENOUS
  Administered 2015-02-25: 50 mg via INTRAVENOUS
  Administered 2015-02-25 (×2): 10 mg via INTRAVENOUS

## 2015-02-25 MED ORDER — HYDROMORPHONE HCL 2 MG/ML IJ SOLN
INTRAMUSCULAR | Status: AC
  Filled 2015-02-25: qty 1

## 2015-02-25 MED ORDER — PROPOFOL 10 MG/ML IV BOLUS
INTRAVENOUS | Status: AC
  Filled 2015-02-25: qty 20

## 2015-02-25 MED ORDER — HYDRALAZINE HCL 20 MG/ML IJ SOLN
INTRAMUSCULAR | Status: AC
  Filled 2015-02-25: qty 1

## 2015-02-25 MED ORDER — HYDRALAZINE HCL 20 MG/ML IJ SOLN: 10.0000 mg | INTRAMUSCULAR | Status: DC | PRN

## 2015-02-25 MED ORDER — METOPROLOL TARTRATE 1 MG/ML IV SOLN
INTRAVENOUS | Status: DC | PRN
  Administered 2015-02-25: 2 mg via INTRAVENOUS

## 2015-02-25 SURGICAL SUPPLY — 63 items
BLADE EXTENDED COATED 6.5IN (ELECTRODE) ×2 IMPLANT
BLADE HEX COATED 2.75 (ELECTRODE) ×2 IMPLANT
BLADE SURG SZ10 CARB STEEL (BLADE) IMPLANT
CATH FOLEY 2WAY SLVR  5CC 16FR (CATHETERS) ×2
CATH FOLEY 2WAY SLVR 5CC 16FR (CATHETERS) ×1 IMPLANT
CATH ROBINSON RED A/P 12FR (CATHETERS) IMPLANT
CATH ROBINSON RED A/P 14FR (CATHETERS) IMPLANT
CATH ROBINSON RED A/P 16FR (CATHETERS) IMPLANT
CATH ROBINSON RED A/P 18FR (CATHETERS) IMPLANT
CATH ROBINSON RED A/P 20FR (CATHETERS) IMPLANT
CLIP TI LARGE 6 (CLIP) ×1 IMPLANT
COVER MAYO STAND STRL (DRAPES) ×2 IMPLANT
COVER SURGICAL LIGHT HANDLE (MISCELLANEOUS) ×2 IMPLANT
DRAIN CHANNEL 19F RND (DRAIN) ×1 IMPLANT
DRAPE C-ARM 42X120 X-RAY (DRAPES) IMPLANT
DRAPE LAPAROSCOPIC ABDOMINAL (DRAPES) ×2 IMPLANT
DRAPE SHEET LG 3/4 BI-LAMINATE (DRAPES) IMPLANT
DRAPE TABLE BACK 44X90 PK DISP (DRAPES) IMPLANT
DRAPE WARM FLUID 44X44 (DRAPE) ×2 IMPLANT
DRESSING SURGICEL FIBRLLR 1X2 (HEMOSTASIS) IMPLANT
DRESSING TELFA ISLAND 4X8 (GAUZE/BANDAGES/DRESSINGS) ×1 IMPLANT
DRSG PAD ABDOMINAL 8X10 ST (GAUZE/BANDAGES/DRESSINGS) IMPLANT
DRSG SURGICEL FIBRILLAR 1X2 (HEMOSTASIS)
DRSG TELFA 4X10 ISLAND STR (GAUZE/BANDAGES/DRESSINGS) ×1 IMPLANT
EVACUATOR SILICONE 100CC (DRAIN) ×1 IMPLANT
GAUZE SPONGE 4X4 12PLY STRL (GAUZE/BANDAGES/DRESSINGS) ×4 IMPLANT
GAUZE SPONGE 4X4 16PLY XRAY LF (GAUZE/BANDAGES/DRESSINGS) IMPLANT
GLOVE BIOGEL PI IND STRL 7.0 (GLOVE) ×1 IMPLANT
GLOVE BIOGEL PI INDICATOR 7.0 (GLOVE) ×1
GOWN STRL REUS W/TWL LRG LVL3 (GOWN DISPOSABLE) ×2 IMPLANT
GOWN STRL REUS W/TWL XL LVL3 (GOWN DISPOSABLE) ×4 IMPLANT
HANDLE SUCTION POOLE (INSTRUMENTS) ×1 IMPLANT
KIT BASIN OR (CUSTOM PROCEDURE TRAY) ×2 IMPLANT
LOOP MINI RED (MISCELLANEOUS) IMPLANT
LOOP VESSEL MAXI BLUE (MISCELLANEOUS) IMPLANT
NDL BIOPSY 14GX4.5 SOFT TIS (NEEDLE) IMPLANT
NEEDLE BIOPSY 14GX4.5 SOFT TIS (NEEDLE) IMPLANT
PACK GENERAL/GYN (CUSTOM PROCEDURE TRAY) ×2 IMPLANT
PLUG CATH AND CAP STER (CATHETERS) IMPLANT
RELOAD STAPLER LINE PROX 60 GR (STAPLE) ×1 IMPLANT
SPONGE DRAIN TRACH 4X4 STRL 2S (GAUZE/BANDAGES/DRESSINGS) ×1 IMPLANT
SPONGE LAP 18X18 X RAY DECT (DISPOSABLE) ×8 IMPLANT
STAPLER RELOAD LINE PROX 60 GR (STAPLE) ×2
STAPLER RELOADABLE 60 GRN THCK (STAPLE) IMPLANT
STAPLER VISISTAT 35W (STAPLE) ×4 IMPLANT
SUCTION POOLE HANDLE (INSTRUMENTS) ×2
SUT ETHILON 2 0 PS N (SUTURE) ×1 IMPLANT
SUT NOV 1 T60/GS (SUTURE) ×2 IMPLANT
SUT SILK 2 0 (SUTURE) ×6
SUT SILK 2 0 SH CR/8 (SUTURE) ×2 IMPLANT
SUT SILK 2 0SH CR/8 30 (SUTURE) ×2 IMPLANT
SUT SILK 2-0 18XBRD TIE 12 (SUTURE) ×1 IMPLANT
SUT SILK 2-0 30XBRD TIE 12 (SUTURE) IMPLANT
SUT SILK 3 0 (SUTURE) ×2
SUT SILK 3 0 SH CR/8 (SUTURE) ×2 IMPLANT
SUT SILK 3-0 18XBRD TIE 12 (SUTURE) ×1 IMPLANT
SUT VIC AB 3-0 SH 18 (SUTURE) IMPLANT
SUT VIC AB 4-0 SH 18 (SUTURE) IMPLANT
SYRINGE 10CC LL (SYRINGE) ×2 IMPLANT
TOWEL OR 17X26 10 PK STRL BLUE (TOWEL DISPOSABLE) ×4 IMPLANT
TUBE FEEDING 5FR 36IN KANGAROO (TUBING) IMPLANT
URINEMETER 200ML W/220 (MISCELLANEOUS) ×2 IMPLANT
YANKAUER SUCT BULB TIP NO VENT (SUCTIONS) ×1 IMPLANT

## 2015-02-25 NOTE — Interval H&P Note (Signed)
History and Physical Interval Note:  02/25/2015 7:01 AM  Derek Mohr Sr.  has presented today for surgery, with the diagnosis of Nueroendocrine tumor of pancreas.  The various methods of treatment have been discussed with the patient and family. After consideration of risks, benefits and other options for treatment, the patient has consented to    Procedure(s): DISTAL PANCREATECTOMY AND SPLENECTOMY (N/A) as a surgical intervention .    The patient's history has been reviewed, patient examined, no change in status, stable for surgery.  I have reviewed the patient's chart and labs.  Questions were answered to the patient's satisfaction.    [name], MD, [facility] Office: [place]

## 2015-02-25 NOTE — Progress Notes (Signed)
ABGS drawn by lab.

## 2015-02-25 NOTE — Progress Notes (Signed)
RT placed PT on CPAP at 13cm with 5 lpm 02 bleed in. PT tolerating well at this time. HR 100, RR 16, Sp02 93%. RN aware.

## 2015-02-25 NOTE — Progress Notes (Signed)
Ree Shay, NP and Dr. Titus Mould   In to see patient and made aware of patient's ABGS results.-Orders given.

## 2015-02-25 NOTE — Progress Notes (Signed)
Portable upright chest x-ray done. 

## 2015-02-25 NOTE — Consult Note (Signed)
Name: Derek RUSIN Sr. MRN: [id number] DOB: November 14, 1945    ADMISSION DATE:  02/25/2015 CONSULTATION DATE:  1/5  REFERRING MD :  Harlow Asa   CHIEF COMPLAINT:  Complex medical management post-op   BRIEF PATIENT DESCRIPTION:  70 year old male who was referred to general surgery for Neuro-endocrine tumor diagnosed in Aug 2016. He has a complex PMH including: GERD, anxiety, DM, OSA  & HTN. He is now s/p  distal pancreatectomy and splenectomy. PCCM was asked to a/w care.    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  has past surgical history that includes Knee arthroscopy (Right, 2002); Rotator cuff repair ('93bilateral '94 lt); Umbilical hernia repair (2000); Knee arthroscopy (03/01/2012); Ankle surgery (Left, 2010); Hardware Removal (Left, 2013); Anal fistulectomy (N/A, 04/01/2013); Replacement total knee (Left, 2009); Total knee arthroplasty (Right, 06/02/2013); Colonoscopy; Upper gastrointestinal endoscopy (sept 2016); Inguinal hernia repair [id number]); and EUS (N/A, 12/31/2014). Prior to Admission medications   Medication Sig Start Date End Date Taking? Authorizing Provider  aspirin EC 81 MG tablet Take 81 mg by mouth at bedtime.   Yes Historical Provider, MD  canagliflozin (INVOKANA) 100 MG TABS tablet Take 100 mg by mouth at bedtime.    Yes Historical Provider, MD  carvedilol (COREG) 25 MG tablet Take 25 mg by mouth 2  (two) times daily with a meal.  09/26/11  Yes Historical Provider, MD  clobetasol ointment (TEMOVATE) [id number] % Apply 1 application topically daily as needed (for peeling skin on hands and feet).    Yes Historical Provider, MD  [employer] TRIPLE MAGNESIUM COMPLEX PO Take 1-2 capsules by mouth 2 (two) times daily. Two capsules in the morning and one at night   Yes Historical Provider, MD  diphenhydrAMINE (BENADRYL) 25 mg capsule Take 50 mg by mouth 2 (two) times daily. Scheduled doses for allergies.   Yes Historical Provider, MD  glimepiride (AMARYL) 4 MG tablet Take 4 mg by mouth daily with breakfast. 11/06/14  Yes Historical Provider, MD  HYDROcodone-acetaminophen (NORCO) 7.5-325 MG tablet Take 1 tablet by mouth every 8 (eight) hours as needed for moderate pain.   Yes Historical Provider, MD  lansoprazole (PREVACID) 15 MG capsule Take 30 mg by mouth daily as needed (reflux).   Yes Historical Provider, MD  losartan (COZAAR) 50 MG tablet Take 50 mg by mouth daily. 11/06/14  Yes Historical Provider, MD  metFORMIN (GLUCOPHAGE-XR) 500 MG 24 hr tablet Take 1,000 mg by mouth 2 (two) times daily.    Yes Historical Provider, MD  naproxen sodium (ALEVE) 220 MG tablet Take 440 mg by mouth 2 (two) times daily with a meal.    Yes Historical Provider, MD  mupirocin ointment (BACTROBAN) 2 % Apply to nares area 3 times daily [id number]  Derek Ruff, MD   Allergies  Allergen Reactions  . Morphine And Related Nausea And Vomiting  . Oxycontin [Oxycodone Hcl] Swelling    Lips swells  . Quinine Other (See Comments)    Platelets dropped  . Voltaren [Diclofenac Sodium]  Other (See Comments)    Elevated liver enzymes    FAMILY HISTORY:  family history includes Bone cancer in his father; Diabetes in his maternal grandmother; Factor V Leiden deficiency in his daughter; Heart disease in his paternal uncle. There is no history of Colon cancer, Esophageal cancer, Stomach cancer, or Rectal cancer. SOCIAL HISTORY:  reports that  he quit smoking about 40 years ago. His smoking use included Cigarettes. He has a 68 pack-year smoking history. He has never used smokeless tobacco. He reports that he does not drink alcohol or use illicit drugs.  REVIEW OF SYSTEMS:   Unable d/t residual sedation. Does state abd hurts. Nursing reports he seems more comfortable since CPAP placed   SUBJECTIVE: no distress.   VITAL SIGNS: Temp:  [97.4 F (36.3 C)-98 F (36.7 C)] 97.4 F (36.3 C) (01/05 1400) Pulse Rate:  [76-97] 97 (01/05 1400) Resp:  [13-25] 15 (01/05 1400) BP: (135-178)/(87-110) 141/99 mmHg (01/05 1400) SpO2:  [89 %-100 %] 92 % (01/05 1400) Arterial Line BP: (173-199)/(75-87) 189/87 mmHg (01/05 1200) Weight:  [118.389 kg (261 lb)] 118.389 kg (261 lb) (01/05 0528)  PHYSICAL EXAMINATION: General:  70 year old white male, resting comfortably on CPAP  Neuro:  Awake, follows commands. No focal def  HEENT:  CPAP mask in place. No JVD  Cardiovascular:  rrr w/out MRG Lungs:  Clear w/ equal rise  Abdomen:  Soft, abd dressing CD&I hypoactive. Tender to palp  Musculoskeletal:  Equal st and mass Skin:  Warm, dry and intact   CBC No results for input(s): WBC, HGB, HCT, PLT in the last 72 hours.  Coag's No results for input(s): APTT, INR in the last 72 hours.  BMET No results for input(s): NA, K, CL, CO2, BUN, CREATININE, GLUCOSE in the last 72 hours.  Electrolytes No results for input(s): CALCIUM, MG, PHOS in the last 72 hours.  Sepsis Markers No results for input(s): PROCALCITON, O2SATVEN in the last 72 hours.  Invalid input(s): LACTICACIDVEN  ABG No results for input(s): PHART, PCO2ART, PO2ART in the last 72 hours.  Liver Enzymes No results for input(s): AST, ALT, ALKPHOS, BILITOT, ALBUMIN in the last 72 hours.  Cardiac Enzymes No results for input(s): TROPONINI, PROBNP in the last 72 hours.  Glucose Recent Labs     02/25/15  0532  02/25/15  0936  02/25/15  1043  02/25/15  1119  GLUCAP  178*  267*   231*  215*    Imaging No results found.   ASSESSMENT / PLAN:  Neuroendocrine Tumor of the Pancreas; now s/p distal pancreatectomy and splenectomy  Plan NPO-->diet per surgery  Post-op wound care per surg  PRN analgesia  Routine post-op pulm hygiene & mobilization   HyperGlycemia w/ H/o DM.Marland Kitchen Had been diet controlled. Now s/p pancreatectomy so will need insulin Plan ssi   Acute Encephalopathy: suspect that this is primarily residual anesthesia. His most recent End-tidal CO2 was 36 prior to being placed on CPAP Plan Ck ABG Ck glucose  Careful w/ sedation  Supportive care   H/o OSA w/ intermittent episodes of Apnea in PACU Plan BIPAP for now; then back to CPAP after anesthesia cleared Ck abg Supportive care   H/o HTN Plan Add low dose scheduled BB  PRN hydralazine  Tele   Erick Colace ACNP-BC [place] Pager # [phone] OR # [phone] if no answer   02/25/2015, 2:16 PM  STAFF NOTE: I, Merrie Roof, MD FACP have personally reviewed patient's available data, including medical history,  events of note, physical examination and test results as part of my evaluation. I have discussed with resident/NP and other care providers such as pharmacist, RN and RRT. In addition, I personally evaluated patient and elicited key findings of: mild lethargic on home cpap in pacu, HTN noted, sys goal 150 for beta blocker addition, HR likely to tolerate, hyperglycemia noted and significant pancrease removed, agree for glu goal 140-180 with moderate SSI q4h, will add lantus once source dextrose or diet added, if remains rising glu would use IV regular insulin bolus 8-10 units hourly till controlled, ABG reviewed, slight lethargic, residual anesthesia and known osa, would change to BIPAP 10/5 for now and follow clinically, no intubation needs now, would use BIPAP 4 hr then off 2 hr as tolerated, would use this also throughout night, upright as able, to sdu / icu at wl,  follow pcxr post op with above abg, unable to use PCA, would limit narcs, consider prb low dose hourly fentanyl   Lavon Paganini. Titus Mould, MD, [place] [facility]: [facility] 02/25/2015 3:10 PM

## 2015-02-25 NOTE — Brief Op Note (Signed)
02/25/2015  11:14 AM  PATIENT:  Derek Mohr Sr.  70 y.o. male  PRE-OPERATIVE DIAGNOSIS:  Neuroendocrine tumor of pancreas  POST-OPERATIVE DIAGNOSIS:  same  PROCEDURE:  Procedure(s): DISTAL PANCREATECTOMY AND SPLENECTOMY (N/A)  SURGEON:  Surgeon(s) and Role:    * [name], MD - Assisting    * Armandina Gemma, MD - Primary  ANESTHESIA:   general  EBL:  Total I/O In: 2200 [I.V.:2200] Out: 800 [Urine:600; Blood:200]  BLOOD ADMINISTERED:none  DRAINS: (19Fr) Blake drain(s) in the lesser sac   LOCAL MEDICATIONS USED:  NONE  SPECIMEN:  Excision  DISPOSITION OF SPECIMEN:  PATHOLOGY  COUNTS:  YES  TOURNIQUET:  * No tourniquets in log *  DICTATION: .Other Dictation: Dictation Number [id number]  PLAN OF CARE: Admit to inpatient   PATIENT DISPOSITION:  PACU - hemodynamically stable.   Delay start of Pharmacological VTE agent (>24hrs) due to surgical blood loss or risk of bleeding: yes  [name], MD, [facility] Office: [phone]

## 2015-02-25 NOTE — Op Note (Signed)
Derek [name], Derek [name] NO.:  [id number]  MEDICAL RECORD NO.:  [id number]  LOCATION:  [facility]                         FACILITY:  [facility]  PHYSICIAN:  [name], MD      DATE OF BIRTH:  1945/11/30  DATE OF PROCEDURE:  02/25/2015                              OPERATIVE REPORT   PREOPERATIVE DIAGNOSIS:  Neuroendocrine tumor of the pancreas.  POSTOPERATIVE DIAGNOSIS:  Neuroendocrine tumor of the pancreas.  PROCEDURE:  Distal pancreatectomy and splenectomy.  SURGEON:  [name], M.D.  ASSISTANT:  [name], M.D.  ANESTHESIA:  General.  ESTIMATED BLOOD LOSS:  200 mL.  PREPARATION:  ChloraPrep.  COMPLICATIONS:  None.  INDICATIONS:  The patient is a 70 year old male referred by Dr. Oretha Caprice for neuroendocrine tumor of the pancreas.  The patient had presented to his primary care physician with lower abdominal pain in August 2016.  The patient underwent abdominal ultrasound, which showed a hypoechoic lesion in the body of the pancreas.  CT scan of the abdomen and pelvis was performed and confirmed a 2-cm mass in the midbody of the pancreas.  Further evaluation included upper endoscopy with endoscopic ultrasound and biopsy.  The fine-needle aspiration showed a neuroendocrine neoplasm.  The patient now comes to Surgery for resection for definitive diagnosis and management.  BODY OF REPORT:  Procedure was done in OR #4 at the [facility].  The patient was brought to the operating room, placed in supine position on the operating room table.  Following administration of general anesthesia, the patient was positioned and then prepped and draped in the usual aseptic fashion.  After ascertaining that an adequate level of anesthesia had been achieved, a bilateral subcostal incision was performed with a #10 blade.  Dissection was carried through subcutaneous tissues.  Fascia was incised.  Rectus muscles were divided bilaterally.  Posterior  fascia was elevated and incised, and the peritoneal cavity was entered cautiously.  Incision was extended to the limits of the initial incision with the electrocautery used for hemostasis.  The patient was briefly explored.  There was no palpable disease in the liver.  Colon and small bowel appeared grossly normal.  Stomach appeared normal.  Orogastric tube was positioned within the stomach.  Spleen was moderately enlarged but appeared grossly normal.  Bookwalter retractor was placed for exposure.  Dissection was begun by incising the gastrocolic ligament with the Harmonic scalpel.  The lesser sac was entered by dividing the gastrocolic ligament and continuing the dissection along the greater curvature of the stomach dividing the short gastric vessels.  These were divided with the Harmonic Scalpel and if necessary suture ligated with 2- 0 silk figure-of-eight suture ligatures.  Bookwalter retractor was repositioned then.  The spleen was then mobilized from the splenic flexure of the colon.  The omentum was taken down from the left upper quadrant with the Harmonic Scalpel and the colon was packed caudad.  The spleen was then mobilized from its lateral peritoneal attachments using the electrocautery and the Harmonic scalpel.  Dissection was carried around to the superior pole of the spleen where a small avulsion of the capsule was noted.  This was packed with a  dry sponge.  Remaining short gastric vessels were divided with the Harmonic scalpel.  The spleen was mobilized laterally to medially beginning at the splenopancreatic pedicle.  Next, dissection was carried out in the lesser sac.  The margins of the spleen were dissected out, and the retroperitoneum was opened.  The spleen was visualized and palpated.  There was not a clear tumor visible.  The CT scan had suggested the tumor along the superior border of the pancreas anteriorly.  This was not seen.  Therefore, the intraoperative  ultrasound was brought into the room and used to examine the pancreas.  Dr. [name] identified a mass at the inferior margin of the pancreas located anteriorly which fit the description from CT scan and ultrasound evaluation.  The splenic artery was dissected off the superior edge of the pancreas for several centimeters.  It was ligated in continuity with 2-0 silk ties and divided.  The pancreas was then mobilized out of the retroperitoneum.  Dissection was carried medially.  The splenic vein was identified just medial to the level of the tumor.  It was gently dissected out and doubly ligated in continuity with 2-0 silk ties and divided.  The entire splenopancreatic pedicle was then elevated past the tumor, and the splenic artery was again dissected out and doubly ligated and divided proximal to the tumor.  Suture ligatures were placed at the superior and inferior edges of the pancreas to secure the marginal arteries.  A TA 60 stapler with a green thick staple load was used to transect the pancreas proximal to the tumor by about 1 to 2 cm.  The pancreas was transected sharply with a #10 blade, and the spleen and pancreas was submitted to Pathology for review.  A suture was used to mark the proximal margin of the pancreas. Frozen section confirmed normal pancreas without evidence of tumor at the surgical margin.  The tumor was transected by the pathologist and appeared to have a yellowish color and measured a little over 1 cm in circumference.  Final pathology will be pending for permanent sections.  Good hemostasis was obtained along the staple line on the pancreas with interrupted 2-0 silk sutures.  The upper abdomen was irrigated copiously with warm saline which was evacuated.  Orogastric tube was removed and a nasogastric tube was properly positioned within the stomach.  A 19- [place] Blake drain was brought in from the left upper quadrant incision and placed beneath the stomach and  over the pancreatic stump.  Drain was secured to the skin with a 3-0 nylon suture.  All sponges were removed. Good hemostasis was noted throughout.  Abdominal wall was closed in 2 layers with running #1 Novafil sutures. Subcutaneous tissues were irrigated.  Skin was closed with stainless steel staples.  Drain was placed to bulb suction.  Sterile dressings were applied.  The patient was awakened from anesthesia and brought to the recovery room.  The patient tolerated the procedure well.   [name], MD, [facility] Office: [phone]   TMG/MEDQ  D:  02/25/2015  T:  02/25/2015  Job:  [id number]  cc:   Gatha Mayer, MD,FACG [address], [place] [address]  Milus Banister, MD  Delmar Landau) Harrington Challenger, M.D. [phone]  Dr. Gala Lewandowsky office

## 2015-02-25 NOTE — Transfer of Care (Signed)
Immediate Anesthesia Transfer of Care Note  Patient: Derek [name].  Procedure(s) Performed: Procedure(s): DISTAL PANCREATECTOMY AND SPLENECTOMY (N/A)  Patient Location: PACU  Anesthesia Type:General  Level of Consciousness:  sedated, patient cooperative and responds to stimulation  Airway & Oxygen Therapy:Patient Spontanous Breathing and Patient connected to face mask oxgen  Post-op Assessment:  Report given to PACU RN and Post -op Vital signs reviewed and stable  Post vital signs:  Reviewed and stable  Last Vitals:  Filed Vitals:   02/25/15 0526 02/25/15 1115  BP: 135/87   Pulse: 76   Temp: 36.7 C 36.4 C  Resp: 18     Complications: No apparent anesthesia complications

## 2015-02-25 NOTE — Progress Notes (Signed)
Dr. Ermalene Postin made aware of patient's condition and orders given by Dr. Harlow Asa and Dr. Titus Mould- also ABGS results

## 2015-02-25 NOTE — Progress Notes (Signed)
Dr. Ermalene Postin made aware of patient's CBG RESULTS in PACU- 215- O.K. To start Dr. Gala Lewandowsky sliding scale Insulin at 12 Noon as ordered

## 2015-02-25 NOTE — Anesthesia Procedure Notes (Signed)
Procedure Name: Intubation Date/Time: 02/25/2015 7:42 AM Performed by: Montel Clock Pre-anesthesia Checklist: Patient identified, Emergency Drugs available, Suction available, Patient being monitored and Timeout performed Patient Re-evaluated:Patient Re-evaluated prior to inductionOxygen Delivery Method: Circle system utilized Preoxygenation: Pre-oxygenation with 100% oxygen Intubation Type: IV induction Ventilation: Mask ventilation without difficulty and Oral airway inserted - appropriate to patient size Laryngoscope Size: Mac and 3 Grade View: Grade I Tube type: Oral Tube size: 7.5 mm Number of attempts: 1 Airway Equipment and Method: Stylet Placement Confirmation: ETT inserted through vocal cords under direct vision,  positive ETCO2 and breath sounds checked- equal and bilateral Secured at: 23 cm Tube secured with: Tape Dental Injury: Teeth and Oropharynx as per pre-operative assessment

## 2015-02-25 NOTE — Anesthesia Preprocedure Evaluation (Addendum)
Anesthesia Evaluation  Patient identified by MRN, date of birth, ID band Patient awake    Reviewed: Allergy & Precautions, NPO status , Patient's Chart, lab work & pertinent test results  History of Anesthesia Complications (+) PONV and history of anesthetic complications  Airway Mallampati: II  TM Distance: >3 FB Neck ROM: Full    Dental  (+) Teeth Intact   Pulmonary neg shortness of breath, sleep apnea , neg COPD, neg recent URI, former smoker, neg PE   breath sounds clear to auscultation       Cardiovascular hypertension, Pt. on medications and Pt. on home beta blockers  Rhythm:Regular     Neuro/Psych PSYCHIATRIC DISORDERS Anxiety Depression negative neurological ROS     GI/Hepatic Neg liver ROS, GERD  ,  Endo/Other  diabetes, Type 2, Oral Hypoglycemic AgentsMorbid obesity  Renal/GU negative Renal ROS     Musculoskeletal  (+) Arthritis ,   Abdominal   Peds  Hematology   Anesthesia Other Findings   Reproductive/Obstetrics                            Anesthesia Physical Anesthesia Plan  ASA: III  Anesthesia Plan: General   Post-op Pain Management:    Induction: Intravenous  Airway Management Planned: Oral ETT  Additional Equipment: Arterial line  Intra-op Plan:   Post-operative Plan: Extubation in OR  Informed Consent: I have reviewed the patients History and Physical, chart, labs and discussed the procedure including the risks, benefits and alternatives for the proposed anesthesia with the patient or authorized representative who has indicated his/her understanding and acceptance.   Dental advisory given  Plan Discussed with: CRNA and Surgeon  Anesthesia Plan Comments:         Anesthesia Quick Evaluation

## 2015-02-25 NOTE — Progress Notes (Signed)
Pt refuse NIV, pt is stable at this time, no distress noted. o2 sats are acceptable. No complications noted.

## 2015-02-25 NOTE — Progress Notes (Signed)
[facility] [place] Progress Note Patient Name: Derek NAPPO Sr. DOB: 04-24-1945 MRN: [id number]   Date of Service  02/25/2015  HPI/Events of Note  RN reports nausea & pain post-op.  eICU Interventions  1. Continue IV Zofran & Consider Phenergan if no help 2. Changing Fentanyl order given patient NPO     Intervention Category Intermediate Interventions: Pain - evaluation and management  Tera Partridge 02/25/2015, 5:38 PM

## 2015-02-26 DIAGNOSIS — E119 Type 2 diabetes mellitus without complications: Secondary | ICD-10-CM

## 2015-02-26 DIAGNOSIS — I10 Essential (primary) hypertension: Secondary | ICD-10-CM | POA: Diagnosis not present

## 2015-02-26 DIAGNOSIS — C7A8 Other malignant neuroendocrine tumors: Secondary | ICD-10-CM | POA: Diagnosis not present

## 2015-02-26 LAB — BASIC METABOLIC PANEL
Anion gap: 14 (ref 5–15)
BUN: 16 mg/dL (ref 6–20)
CO2: 23 mmol/L (ref 22–32)
Calcium: 9.8 mg/dL (ref 8.9–10.3)
Chloride: 101 mmol/L (ref 101–111)
Creatinine, Ser: 1.01 mg/dL (ref 0.61–1.24)
GFR calc Af Amer: >60 mL/min (ref >60)
GFR calc non Af Amer: >60 mL/min (ref >60)
Glucose, Bld: 143 mg/dL — ABNORMAL HIGH (ref 65–99)
Potassium: 5.1 mmol/L (ref 3.5–5.1)
Sodium: 138 mmol/L (ref 135–145)

## 2015-02-26 LAB — GLUCOSE, CAPILLARY
Glucose-Capillary: 108 mg/dL — ABNORMAL HIGH (ref 65–99)
Glucose-Capillary: 135 mg/dL — ABNORMAL HIGH (ref 65–99)
Glucose-Capillary: 150 mg/dL — ABNORMAL HIGH (ref 65–99)
Glucose-Capillary: 196 mg/dL — ABNORMAL HIGH (ref 65–99)
Glucose-Capillary: 175 mg/dL — ABNORMAL HIGH (ref 65–99)

## 2015-02-26 LAB — CBC
HCT: 42.1 % (ref 39.0–52.0)
Hemoglobin: 12.8 g/dL — ABNORMAL LOW (ref 13.0–17.0)
MCH: 24 pg — ABNORMAL LOW (ref 26.0–34.0)
MCHC: 30.4 g/dL (ref 30.0–36.0)
MCV: 79 fL (ref 78.0–100.0)
Platelets: 285 10*3/uL (ref 150–400)
RBC: 5.33 MIL/uL (ref 4.22–5.81)
RDW: 14.9 % (ref 11.5–15.5)
WBC: 13.2 10*3/uL — ABNORMAL HIGH (ref 4.0–10.5)

## 2015-02-26 MED ORDER — MENTHOL 3 MG MT LOZG
1.0000 | LOZENGE | OROMUCOSAL | Status: DC | PRN
  Filled 2015-02-26: qty 9

## 2015-02-26 MED ORDER — HYDROMORPHONE HCL 1 MG/ML IJ SOLN
0.5000 mg | INTRAMUSCULAR | Status: DC | PRN
  Filled 2015-02-26 (×2): qty 1
  Filled 2015-02-26: qty 2
  Filled 2015-02-26: qty 1

## 2015-02-26 MED ORDER — NALOXONE HCL 0.4 MG/ML IJ SOLN: 0.4000 mg | INTRAMUSCULAR | Status: DC | PRN

## 2015-02-26 MED ORDER — PHENOL 1.4 % MT LIQD
2.0000 | OROMUCOSAL | Status: DC | PRN
  Filled 2015-02-26: qty 177

## 2015-02-26 MED ORDER — SODIUM CHLORIDE 0.9 % IJ SOLN: 9.0000 mL | INTRAMUSCULAR | Status: DC | PRN

## 2015-02-26 MED ORDER — ACETAMINOPHEN 650 MG RE SUPP: 650.0000 mg | Freq: Four times a day (QID) | RECTAL | Status: DC | PRN

## 2015-02-26 MED ORDER — METHOCARBAMOL 1000 MG/10ML IJ SOLN
1000.0000 mg | Freq: Four times a day (QID) | INTRAVENOUS | Status: DC | PRN
  Administered 2015-02-26 – 2015-03-02 (×3): 1000 mg via INTRAVENOUS
  Filled 2015-02-26 (×6): qty 10

## 2015-02-26 MED ORDER — DIPHENHYDRAMINE HCL 50 MG/ML IJ SOLN: 12.5000 mg | Freq: Four times a day (QID) | INTRAMUSCULAR | Status: DC | PRN

## 2015-02-26 MED ORDER — KETOROLAC TROMETHAMINE 15 MG/ML IJ SOLN
15.0000 mg | Freq: Once | INTRAMUSCULAR | Status: AC
  Administered 2015-02-26: 15 mg via INTRAVENOUS
  Filled 2015-02-26: qty 1

## 2015-02-26 MED ORDER — ONDANSETRON HCL 4 MG/2ML IJ SOLN: 4.0000 mg | Freq: Four times a day (QID) | INTRAMUSCULAR | Status: DC | PRN

## 2015-02-26 MED ORDER — ENOXAPARIN SODIUM 60 MG/0.6ML ~~LOC~~ SOLN
50.0000 mg | SUBCUTANEOUS | Status: DC
  Administered 2015-02-26 – 2015-03-02 (×5): 50 mg via SUBCUTANEOUS
  Filled 2015-02-26 (×7): qty 0.6

## 2015-02-26 MED ORDER — FENTANYL 40 MCG/ML IV SOLN
INTRAVENOUS | Status: DC
  Administered 2015-02-26 (×3): 120 ug via INTRAVENOUS
  Administered 2015-02-26: 09:00:00 via INTRAVENOUS
  Filled 2015-02-26: qty 25

## 2015-02-26 MED ORDER — BISACODYL 10 MG RE SUPP: 10.0000 mg | Freq: Two times a day (BID) | RECTAL | Status: DC | PRN

## 2015-02-26 MED ORDER — ACETAMINOPHEN 10 MG/ML IV SOLN
1000.0000 mg | Freq: Once | INTRAVENOUS | Status: AC
  Administered 2015-02-26: 1000 mg via INTRAVENOUS
  Filled 2015-02-26: qty 100

## 2015-02-26 MED ORDER — LIP MEDEX EX OINT
1.0000 "application " | TOPICAL_OINTMENT | Freq: Two times a day (BID) | CUTANEOUS | Status: DC
  Administered 2015-02-26 – 2015-03-02 (×5): 1 via TOPICAL
  Filled 2015-02-26 (×2): qty 7

## 2015-02-26 MED ORDER — HYDROMORPHONE 1 MG/ML IV SOLN
INTRAVENOUS | Status: DC
  Administered 2015-02-26: 23:00:00 via INTRAVENOUS
  Administered 2015-02-27: 2.4 mg via INTRAVENOUS
  Administered 2015-02-27: 2.7 mg via INTRAVENOUS
  Administered 2015-02-27: 3.6 mg via INTRAVENOUS
  Administered 2015-02-27 (×2): 2.7 mg via INTRAVENOUS
  Administered 2015-02-28: 39.75 mL via INTRAVENOUS
  Administered 2015-02-28: 4.5 mL via INTRAVENOUS
  Administered 2015-02-28: 01:00:00 via INTRAVENOUS
  Filled 2015-02-26 (×2): qty 25

## 2015-02-26 MED ORDER — ALUM & MAG HYDROXIDE-SIMETH 200-200-20 MG/5ML PO SUSP: 30.0000 mL | Freq: Four times a day (QID) | ORAL | Status: DC | PRN

## 2015-02-26 MED ORDER — DIPHENHYDRAMINE HCL 12.5 MG/5ML PO ELIX: 12.5000 mg | ORAL_SOLUTION | Freq: Four times a day (QID) | ORAL | Status: DC | PRN

## 2015-02-26 MED ORDER — KETOROLAC TROMETHAMINE 15 MG/ML IJ SOLN
15.0000 mg | Freq: Four times a day (QID) | INTRAMUSCULAR | Status: AC
  Administered 2015-02-26 – 2015-03-01 (×12): 15 mg via INTRAVENOUS
  Filled 2015-02-26 (×12): qty 1

## 2015-02-26 MED ORDER — PROMETHAZINE HCL 25 MG/ML IJ SOLN
6.2500 mg | INTRAMUSCULAR | Status: DC | PRN
  Administered 2015-02-28 – 2015-03-02 (×2): 12.5 mg via INTRAVENOUS
  Filled 2015-02-26 (×2): qty 1

## 2015-02-26 MED ORDER — LACTATED RINGERS IV BOLUS (SEPSIS)
1000.0000 mL | Freq: Three times a day (TID) | INTRAVENOUS | Status: AC | PRN
Start: 2015-02-26 — End: 2015-02-28

## 2015-02-26 MED ORDER — DEXTROSE-NACL 5-0.45 % IV SOLN
INTRAVENOUS | Status: DC
Start: 2015-02-26 — End: 2015-03-03
  Administered 2015-02-26 – 2015-03-02 (×5): via INTRAVENOUS

## 2015-02-26 MED ORDER — ENOXAPARIN SODIUM 40 MG/0.4ML ~~LOC~~ SOLN: 40.0000 mg | SUBCUTANEOUS | Status: DC

## 2015-02-26 MED ORDER — MAGIC MOUTHWASH
15.0000 mL | Freq: Four times a day (QID) | ORAL | Status: DC | PRN
  Filled 2015-02-26: qty 15

## 2015-02-26 NOTE — Care Management Note (Signed)
Case Management Note  Patient Details  Name: Derek TIENKEN Sr. MRN: [id number] Date of Birth: 02/17/1946  Subjective/Objective:           pancresectomy          Date: February 26, 2015 Chart reviewed for concurrent status and case management needs. Will continue to follow patient for changes and needs: Derek Harman, RN, BSN, [place]   [phone]    Expected Discharge Date:                  Expected Discharge Plan:  Home/Self Care  In-House Referral:  NA  Discharge planning Services  CM Consult  Post Acute Care Choice:  NA Choice offered to:  NA  DME Arranged:    DME Agency:     HH Arranged:    [place] Agency:     Status of Service:  Completed, signed off  Medicare Important Message Given:    Date Medicare IM Given:    Medicare IM give by:    Date Additional Medicare IM Given:    Additional Medicare Important Message give by:     If discussed at [place] of Stay Meetings, dates discussed:    Additional Comments:  Leeroy Cha, RN 02/26/2015, 11:32 AM

## 2015-02-26 NOTE — Progress Notes (Signed)
[facility] Pharmacist-Brief Progress Note Patient Name: Derek [name]. DOB: [birth year] MRN: [id number]   Date of Service  02/26/2015  HPI/Events of Note  Enoxaparin 40mg  SQ q24h ordered. BMI>30, CrCl  >30 Wt. 115 kg  eICU Interventions  Dose adjusted to 50 mg q24h (rounded from 0.5 mg/kg)   Attending: Abagail Kitchens 02/26/2015, 3:48 PM

## 2015-02-26 NOTE — Progress Notes (Signed)
Name: Derek PULLUM Sr. MRN: [id number] DOB: 1945-09-21    ADMISSION DATE:  02/25/2015 CONSULTATION DATE:  1/5  REFERRING MD :  Harlow Asa   CHIEF COMPLAINT:  Complex medical management post-op   BRIEF PATIENT DESCRIPTION:  70 year old male who was referred to general surgery for Neuro-endocrine tumor diagnosed in Aug 2016. He has a complex PMH including: GERD, anxiety, DM, OSA  & HTN. He is now s/p  distal pancreatectomy and splenectomy. PCCM was asked to a/w care.    SIGNIFICANT EVENTS    STUDIES:   SUBJECTIVE: no distress but lots of pain .   VITAL SIGNS: Temp:  [97.4 F (36.3 C)-99.2 F (37.3 C)] 98.1 F (36.7 C) (01/06 0800) Pulse Rate:  [81-111] 105 (01/06 0800) Resp:  [13-31] 31 (01/06 0800) BP: (137-185)/(77-114) 156/91 mmHg (01/06 0800) SpO2:  [89 %-100 %] 95 % (01/06 0800) Arterial Line BP: (173-199)/(75-87) 189/87 mmHg (01/05 1200) FiO2 (%):  [30 %] 30 % (01/05 1523) Weight:  [115.8 kg (255 lb 4.7 oz)] 115.8 kg (255 lb 4.7 oz) (01/05 1645) [place]  PHYSICAL EXAMINATION: General:  70 year old white male, c/o sig pain  Neuro:  Awake, follows commands. No focal def  HEENT:  NCAT, no JVD , NGT in place  Cardiovascular:  rrr w/out MRG Lungs:  Clear w/ out accessory muscle use  Abdomen:  Soft, abd dressing CD&I hypoactive. Tender to palp  Musculoskeletal:  Equal st and mass Skin:  Warm, dry and intact   CBC Recent Labs     02/25/15  2115  02/26/15  0334  WBC  13.7*  13.2*  HGB  13.0  12.8*  HCT  41.7  42.1  PLT  288  285    Coag's No results for input(s): APTT, INR in the last 72 hours.  BMET Recent Labs     02/25/15  2115  02/26/15  0334  NA   --   138  K   --   5.1  CL   --   101  CO2   --   23  BUN   --   16  CREATININE  1.14  1.01  GLUCOSE   --   143*    Electrolytes Recent Labs     02/26/15  0334  CALCIUM  9.8    Sepsis Markers No results for input(s): PROCALCITON, O2SATVEN in the last 72 hours.  Invalid input(s):  LACTICACIDVEN  ABG Recent Labs     02/25/15  1427  PHART  7.319*  PCO2ART  42.9  PO2ART  68.7*    Liver Enzymes No results for input(s): AST, ALT, ALKPHOS, BILITOT, ALBUMIN in the last 72 hours.  Cardiac Enzymes No results for input(s): TROPONINI, PROBNP in the last 72 hours.  Glucose Recent Labs     02/25/15  1523  02/25/15  1625  02/25/15  1955  02/25/15  2321  02/26/15  0330  02/26/15  0743  GLUCAP  126*  146*  136*  119*  108*  135*    Imaging Dg Chest Port 1 View  02/25/2015  CLINICAL DATA:  Acute respiratory acidosis following surgery. Hypertension. EXAM: PORTABLE CHEST 1 VIEW COMPARISON:  December 21, 2013 FINDINGS: There is mild right base atelectatic change. There is no edema or consolidation. Heart is upper normal in size with pulmonary vascularity within normal limits. No adenopathy. There is a hiatal hernia. Nasogastric tube tip and side port are below the diaphragm. IMPRESSION: Hiatal hernia present. No edema  or consolidation. There is mild right base atelectasis. Electronically Signed   By: Lowella Grip III M.D.   On: 02/25/2015 15:42   ASSESSMENT / PLAN:  Neuroendocrine Tumor of the Pancreas; now s/p distal pancreatectomy and splenectomy  Plan NPO-->diet per surgery  Post-op wound care per surg  PRN analgesia  Routine post-op pulm hygiene & mobilization   HyperGlycemia w/ H/o DM.Marland Kitchen Had been diet controlled. Now s/p pancreatectomy so will need insulin Plan ssi   H/o OSA w/ intermittent episodes of Apnea in PACU-->does not use CPAP at home.  At risk for post-op atx  Plan Wean FIO2 Monitor End-tidal CO2 Mobilize Add IS   [place]line Hyperkalemia Plan D/c LR Changed IVF to D51/2; may need to increase glycemic control w/ this  H/o HTN Plan Added low dose scheduled BB -->change back to oral coreg when [facility] to use Gut  PRN hydralazine     Erick Colace ACNP-BC [place] Pager # [phone] OR # [phone] if no  answer   02/26/2015, 8:46 AM   02/26/2015 8:46 AM

## 2015-02-26 NOTE — Progress Notes (Signed)
RN calling elink  patinet insisting on repeat toradol for pain relief despite risk for renal failure. RN , I witnessed overhearing on phone again explained risk for renal failure but he still want it  p Iv toradol 15mg  x 1  Dr. Brand Males, M.D., [facility].C.P Pulmonary and Critical Care Medicine Staff Physician [facility] Pager: [phone], If no answer or between  15:00h - 7:00h: call [phone]  02/26/2015 4:30 PM

## 2015-02-26 NOTE — Progress Notes (Signed)
Patient ID: Derek Mohr Sr., male   DOB: 1945/05/17, 70 y.o.   MRN: [id number]  [facility]  POD#: 1  Subjective: Patient in stepdown unit.  Awake and alert and responsive.  Complains of incisional pain.  Objective: Vital signs in last 24 hours: Temp:  [97.4 F (36.3 C)-99.2 F (37.3 C)] 98.1 F (36.7 C) (01/06 0800) Pulse Rate:  [81-111] 105 (01/06 0800) Resp:  [13-31] 31 (01/06 0800) BP: (137-185)/(77-114) 156/91 mmHg (01/06 0800) SpO2:  [89 %-100 %] 95 % (01/06 0800) Arterial Line BP: (173-199)/(75-87) 189/87 mmHg (01/05 1200) FiO2 (%):  [30 %] 30 % (01/05 1523) Weight:  [115.8 kg (255 lb 4.7 oz)] 115.8 kg (255 lb 4.7 oz) (01/05 1645)    Intake/Output from previous day: 01/05 0701 - 01/06 0700 In: 4708.3 [I.V.:4708.3] Out: 3265 [Urine:2900; Emesis/NG output:20; Drains:145; Blood:200] Intake/Output this shift: Total I/O In: 145 [I.V.:125; NG/GT:20] Out: 430 [Urine:250; Emesis/NG output:100; Drains:80]  Physical Exam: HEENT - sclerae clear, mucous membranes moist Neck - soft Chest - clear bilaterally Cor - RRR Abdomen - soft, dressing dry and intact; drain with thin dark fluid, moderate Ext - no edema, non-tender Neuro - alert & oriented, no focal deficits  Lab Results:   Recent Labs  02/25/15 2115 02/26/15 0334  WBC 13.7* 13.2*  HGB 13.0 12.8*  HCT 41.7 42.1  PLT 288 285   BMET  Recent Labs  02/25/15 2115 02/26/15 0334  NA  --  138  K  --  5.1  CL  --  101  CO2  --  23  GLUCOSE  --  143*  BUN  --  16  CREATININE 1.14 1.01  CALCIUM  --  9.8   PT/INR No results for input(s): LABPROT, INR in the last 72 hours. Comprehensive Metabolic Panel:    Component Value Date/Time   NA 138 02/26/2015 0334   NA 139 02/19/2015 0920   K 5.1 02/26/2015 0334   K 4.7 02/19/2015 0920   CL 101 02/26/2015 0334   CL 107 02/19/2015 0920   CO2 23 02/26/2015 0334   CO2 24 02/19/2015 0920   BUN 16 02/26/2015 0334   BUN 25*  02/19/2015 0920   CREATININE 1.01 02/26/2015 0334   CREATININE 1.14 02/25/2015 2115   GLUCOSE 143* 02/26/2015 0334   GLUCOSE 161* 02/19/2015 0920   CALCIUM 9.8 02/26/2015 0334   CALCIUM 10.8* 02/19/2015 0920   AST 19 02/19/2015 0920   AST 16 05/22/2013 0930   ALT 23 02/19/2015 0920   ALT 17 05/22/2013 0930   ALKPHOS 83 02/19/2015 0920   ALKPHOS 97 05/22/2013 0930   BILITOT 0.6 02/19/2015 0920   BILITOT 0.3 05/22/2013 0930   PROT 6.9 02/19/2015 0920   PROT 7.1 05/22/2013 0930   ALBUMIN 4.2 02/19/2015 0920   ALBUMIN 4.0 05/22/2013 0930    Studies/Results: Dg Chest Port 1 View  02/25/2015  CLINICAL DATA:  Acute respiratory acidosis following surgery. Hypertension. EXAM: PORTABLE CHEST 1 VIEW COMPARISON:  December 21, 2013 FINDINGS: There is mild right base atelectatic change. There is no edema or consolidation. Heart is upper normal in size with pulmonary vascularity within normal limits. No adenopathy. There is a hiatal hernia. Nasogastric tube tip and side port are below the diaphragm. IMPRESSION: Hiatal hernia present. No edema or consolidation. There is mild right base atelectasis. Electronically Signed   By: Lowella Grip III M.D.   On: 02/25/2015 15:42    Anti-infectives: Anti-infectives  Start     Dose/Rate Route Frequency Ordered Stop   02/25/15 0600  ertapenem (INVANZ) 1 g in sodium chloride 0.9 % 50 mL IVPB     1 g 100 mL/hr over 30 Minutes Intravenous On call to O.R. 02/24/15 1332 02/25/15 0750      Assessment & Plans: Status post distal pancreatectomy and splenectomy for neuroendocrine tumor of pancreas  NG decompression, IVF, NPO except ice chips today  Pain control with fentanyl PCA  OOB to chair today  Appreciate CCM assistance with BP management, DM management, sleep apnea  Blood glucose levels better than expected after pancreatic resection  Remain in stepdown today if possible  [name], MD, [facility] Office:  [place] 02/26/2015

## 2015-02-26 NOTE — Progress Notes (Signed)
Fentanyl PCA d/c'd and 240 mcg wasted in sink =68ml. Benjamin Stain RN and Raenette Rover, RN witnessed.

## 2015-02-27 DIAGNOSIS — C7A8 Other malignant neuroendocrine tumors: Secondary | ICD-10-CM | POA: Diagnosis not present

## 2015-02-27 DIAGNOSIS — E1165 Type 2 diabetes mellitus with hyperglycemia: Secondary | ICD-10-CM | POA: Diagnosis present

## 2015-02-27 DIAGNOSIS — E872 Acidosis: Secondary | ICD-10-CM | POA: Diagnosis not present

## 2015-02-27 DIAGNOSIS — E119 Type 2 diabetes mellitus without complications: Secondary | ICD-10-CM | POA: Diagnosis present

## 2015-02-27 LAB — GLUCOSE, CAPILLARY
Glucose-Capillary: 210 mg/dL — ABNORMAL HIGH (ref 65–99)
Glucose-Capillary: 223 mg/dL — ABNORMAL HIGH (ref 65–99)
Glucose-Capillary: 201 mg/dL — ABNORMAL HIGH (ref 65–99)
Glucose-Capillary: 205 mg/dL — ABNORMAL HIGH (ref 65–99)
Glucose-Capillary: 192 mg/dL — ABNORMAL HIGH (ref 65–99)
Glucose-Capillary: 141 mg/dL — ABNORMAL HIGH (ref 65–99)

## 2015-02-27 LAB — MAGNESIUM: Magnesium: 2.1 mg/dL (ref 1.7–2.4)

## 2015-02-27 LAB — BASIC METABOLIC PANEL
Anion gap: 11 (ref 5–15)
BUN: 23 mg/dL — ABNORMAL HIGH (ref 6–20)
CO2: 23 mmol/L (ref 22–32)
Calcium: 9.9 mg/dL (ref 8.9–10.3)
Chloride: 103 mmol/L (ref 101–111)
Creatinine, Ser: 1.17 mg/dL (ref 0.61–1.24)
GFR calc Af Amer: >60 mL/min (ref >60)
GFR calc non Af Amer: >60 mL/min (ref >60)
Glucose, Bld: 257 mg/dL — ABNORMAL HIGH (ref 65–99)
Potassium: 4.3 mmol/L (ref 3.5–5.1)
Sodium: 137 mmol/L (ref 135–145)

## 2015-02-27 LAB — PHOSPHORUS: Phosphorus: 2.2 mg/dL — ABNORMAL LOW (ref 2.5–4.6)

## 2015-02-27 MED ORDER — LIVING WELL WITH DIABETES BOOK
Freq: Once | Status: DC
  Filled 2015-02-27: qty 1

## 2015-02-27 MED ORDER — SALINE SPRAY 0.65 % NA SOLN
2.0000 | NASAL | Status: DC | PRN
  Filled 2015-02-27: qty 44

## 2015-02-27 MED ORDER — INSULIN ASPART 100 UNIT/ML ~~LOC~~ SOLN
0.0000 [IU] | SUBCUTANEOUS | Status: DC
  Administered 2015-02-27: 4 [IU] via SUBCUTANEOUS
  Administered 2015-02-27: 7 [IU] via SUBCUTANEOUS
  Administered 2015-02-27: 3 [IU] via SUBCUTANEOUS
  Administered 2015-02-28: 4 [IU] via SUBCUTANEOUS
  Administered 2015-02-28: 3 [IU] via SUBCUTANEOUS
  Administered 2015-02-28 (×4): 4 [IU] via SUBCUTANEOUS
  Administered 2015-03-01: 7 [IU] via SUBCUTANEOUS
  Administered 2015-03-01 (×3): 4 [IU] via SUBCUTANEOUS
  Administered 2015-03-01: 7 [IU] via SUBCUTANEOUS
  Administered 2015-03-02: 3 [IU] via SUBCUTANEOUS
  Administered 2015-03-02: 4 [IU] via SUBCUTANEOUS
  Administered 2015-03-02: 7 [IU] via SUBCUTANEOUS
  Administered 2015-03-02: 3 [IU] via SUBCUTANEOUS
  Administered 2015-03-02 (×2): 4 [IU] via SUBCUTANEOUS
  Administered 2015-03-03: 3 [IU] via SUBCUTANEOUS
  Administered 2015-03-03: 4 [IU] via SUBCUTANEOUS
  Administered 2015-03-03: 5 [IU] via SUBCUTANEOUS
  Administered 2015-03-03: 3 [IU] via SUBCUTANEOUS

## 2015-02-27 MED ORDER — INSULIN GLARGINE 100 UNIT/ML ~~LOC~~ SOLN
10.0000 [IU] | Freq: Two times a day (BID) | SUBCUTANEOUS | Status: DC
  Administered 2015-02-27 (×2): 10 [IU] via SUBCUTANEOUS
  Filled 2015-02-27 (×4): qty 0.1

## 2015-02-27 NOTE — Progress Notes (Signed)
Inpatient Diabetes Program Recommendations  AACE/ADA: New Consensus Statement on Inpatient Glycemic Control (2015)  Target Ranges:  Prepandial:   less than 140 mg/dL      Peak postprandial:   less than 180 mg/dL (1-2 hours)      Critically ill patients:  140 - 180 mg/dL   Review of Glycemic Control  Results for THAMAS, KROENING (MRN [id number]) as of 02/27/2015 11:41  Ref. Range 02/26/2015 16:35 02/26/2015 20:30 02/27/2015 00:22 02/27/2015 04:18 02/27/2015 07:43  Glucose-Capillary Latest Ref Range: 65-99 mg/dL 196 (H) 175 (H) 223 (H) 210 (H) 201 (H)    Outpatient Diabetes medications: Invokana 100mg  qhs, Amaryl 4mg  qday, Metformin 1000mg  bid Current orders for Inpatient glycemic control: Lantus 10 units bid, Novolog 0-20 units q4h  Inpatient Diabetes Program Recommendations: Agree with current orders for diabetes management.                    * noted outpatient diabetes education and Living Well with Diabetes book ordered.   Gentry Fitz, RN, BA, MHA, CDE Diabetes Coordinator Inpatient Diabetes Program  [phone] (Team Pager) [phone] ([place]) 02/27/2015 12:02 PM

## 2015-02-27 NOTE — Progress Notes (Signed)
[address] [place], [place] [address] Phone: [phone] FAX: [place] [phone] [birth year]   Assessment  OK  Problem List:   Principal Problem:   Neuroendocrine tumor of pancreas [facility]) Active Problems:   Acute respiratory acidosis   2 Days Post-Op  02/25/2015  Procedure(s): DISTAL PANCREATECTOMY AND SPLENECTOMY    Plan:  -transfer to floor -try NGT clamping with low NGT output - d/c in AM if does well -nasal drops for congestion -pain control - Dilaudid PCA working better -DM control - add low dose lantus for now until PO meds can resume.   Inc SSI to resistant  -d/c foley -wean IVF -VTE prophylaxis- SCDs, etc -mobilize as tolerated to help recovery - GET HIM UP!  Adin Hector, M.D., F.A.C.S. Gastrointestinal and Minimally Invasive Surgery [facility] [address] [place],  [address] [phone] Main / Paging   02/27/2015  Subjective:  C/o severe nasal congestion / cough - hacking away every 5 seconds x 79mn QAM (then stops) Pain better controlled w Dilaudid PCA & Toradol Staying in bed    Objective:  Vital signs:  Filed Vitals:   02/27/15 0424 02/27/15 0500 02/27/15 0600 02/27/15 0800  BP:  131/79 119/75   Pulse:  103 91   Temp: 98.3 F (36.8 C)   98.2 F (36.8 C)  TempSrc: Oral   Oral  Resp:  '18 17 14  ' Height:      Weight:   114.6 kg (252 lb 10.4 oz)   SpO2:  100% 100% 98%       Intake/Output   Yesterday:  01/06 0701 - 01/07 0700 In: 2461.7 [I.V.:2281.7; NG/GT:20; IV Piggyback:160] Out: 2110 [Urine:1675; Emesis/NG output:200; Drains:235] This shift:     Bowel function:  Flatus: n  BM: n  Drain: dark blood  Physical Exam:  General: Pt awake/alert/oriented x4 in no acute distress Eyes: PERRL, normal EOM.  Sclera clear.  No icterus Neuro: CN II-XII intact w/o focal sensory/motor  deficits. Lymph: No head/neck/groin lymphadenopathy Psych:  No delerium/psychosis/paranoia HENT: Normocephalic, Mucus membranes moist.  No thrush.  Coughing up clear foam/saliva Neck: Supple, No tracheal deviation Chest: No chest wall pain w good excursion CV:  Pulses intact.  Regular rhythm MS: Normal AROM mjr joints.  No obvious deformity Abdomen: Mostly Soft.  Obese.  Nondistended.  Mildly tender at incision only.  Dressing c/d/i.  No evidence of peritonitis.  No incarcerated hernias. Ext:  SCDs BLE.  No mjr edema.  No cyanosis Skin: No petechiae / purpura  Results:   Labs: Results for orders placed or performed during the hospital encounter of 02/25/15 (from the past 48 hour(s))  Glucose, capillary     Status: Abnormal   Collection Time: 02/25/15 11:19 AM  Result Value Ref Range   Glucose-Capillary 215 (H) 65 - 99 mg/dL   Comment 1 Notify RN    Comment 2 Document in Chart   Blood gas, arterial     Status: Abnormal   Collection Time: 02/25/15  2:27 PM  Result Value Ref Range   O2 Content 5.0 L/min   Delivery systems CONTINUOUS POSITIVE AIRWAY PRESSURE    Expiratory PAP 13    pH, Arterial 7.319 (L) 7.350 - 7.450   pCO2 arterial 42.9 35.0 - 45.0 mmHg   pO2, Arterial 68.7 (L) 80.0 - 100.0 mmHg   Bicarbonate 21.7 20.0 - 24.0 mEq/L   TCO2 19.8 0 -  100 mmol/L   Acid-base deficit 4.0 (H) 0.0 - 2.0 mmol/L   O2 Saturation 91.4 %   Patient temperature 97.4    Collection site LEFT RADIAL    Drawn by [phone]    Sample type ARTERIAL DRAW    Allens test (pass/fail) PASS PASS  Glucose, capillary     Status: Abnormal   Collection Time: 02/25/15  3:23 PM  Result Value Ref Range   Glucose-Capillary 126 (H) 65 - 99 mg/dL   Comment 1 Notify RN    Comment 2 Document in Chart   Glucose, capillary     Status: Abnormal   Collection Time: 02/25/15  4:25 PM  Result Value Ref Range   Glucose-Capillary 146 (H) 65 - 99 mg/dL   Comment 1 Notify RN    Comment 2 Document in Chart   Glucose,  capillary     Status: Abnormal   Collection Time: 02/25/15  7:55 PM  Result Value Ref Range   Glucose-Capillary 136 (H) 65 - 99 mg/dL   Comment 1 Notify RN   CBC     Status: Abnormal   Collection Time: 02/25/15  9:15 PM  Result Value Ref Range   WBC 13.7 (H) 4.0 - 10.5 K/uL   RBC 5.42 4.22 - 5.81 MIL/uL   Hemoglobin 13.0 13.0 - 17.0 g/dL   HCT 41.7 39.0 - 52.0 %   MCV 76.9 (L) 78.0 - 100.0 fL   MCH 24.0 (L) 26.0 - 34.0 pg   MCHC 31.2 30.0 - 36.0 g/dL   RDW 14.7 11.5 - 15.5 %   Platelets 288 150 - 400 K/uL  Creatinine, serum     Status: None   Collection Time: 02/25/15  9:15 PM  Result Value Ref Range   Creatinine, Ser 1.14 0.61 - 1.24 mg/dL   GFR calc non Af Amer >60 >60 mL/min   GFR calc Af Amer >60 >60 mL/min    Comment: (NOTE) The eGFR has been calculated using the CKD EPI equation. This calculation has not been validated in all clinical situations. eGFR's persistently <60 mL/min signify possible Chronic Kidney Disease.   Glucose, capillary     Status: Abnormal   Collection Time: 02/25/15 11:21 PM  Result Value Ref Range   Glucose-Capillary 119 (H) 65 - 99 mg/dL   Comment 1 Notify RN   Glucose, capillary     Status: Abnormal   Collection Time: 02/26/15  3:30 AM  Result Value Ref Range   Glucose-Capillary 108 (H) 65 - 99 mg/dL   Comment 1 Notify RN   Basic metabolic panel     Status: Abnormal   Collection Time: 02/26/15  3:34 AM  Result Value Ref Range   Sodium 138 135 - 145 mmol/L   Potassium 5.1 3.5 - 5.1 mmol/L   Chloride 101 101 - 111 mmol/L   CO2 23 22 - 32 mmol/L   Glucose, Bld 143 (H) 65 - 99 mg/dL   BUN 16 6 - 20 mg/dL   Creatinine, Ser 1.01 0.61 - 1.24 mg/dL   Calcium 9.8 8.9 - 10.3 mg/dL   GFR calc non Af Amer >60 >60 mL/min   GFR calc Af Amer >60 >60 mL/min    Comment: (NOTE) The eGFR has been calculated using the CKD EPI equation. This calculation has not been validated in all clinical situations. eGFR's persistently <60 mL/min signify possible  Chronic Kidney Disease.    Anion gap 14 5 - 15  CBC     Status: Abnormal  Collection Time: 02/26/15  3:34 AM  Result Value Ref Range   WBC 13.2 (H) 4.0 - 10.5 K/uL   RBC 5.33 4.22 - 5.81 MIL/uL   Hemoglobin 12.8 (L) 13.0 - 17.0 g/dL   HCT 42.1 39.0 - 52.0 %   MCV 79.0 78.0 - 100.0 fL   MCH 24.0 (L) 26.0 - 34.0 pg   MCHC 30.4 30.0 - 36.0 g/dL   RDW 14.9 11.5 - 15.5 %   Platelets 285 150 - 400 K/uL  Glucose, capillary     Status: Abnormal   Collection Time: 02/26/15  7:43 AM  Result Value Ref Range   Glucose-Capillary 135 (H) 65 - 99 mg/dL   Comment 1 Notify RN    Comment 2 Document in Chart   Glucose, capillary     Status: Abnormal   Collection Time: 02/26/15 12:03 PM  Result Value Ref Range   Glucose-Capillary 150 (H) 65 - 99 mg/dL   Comment 1 Notify RN    Comment 2 Document in Chart   Glucose, capillary     Status: Abnormal   Collection Time: 02/26/15  4:35 PM  Result Value Ref Range   Glucose-Capillary 196 (H) 65 - 99 mg/dL   Comment 1 Notify RN    Comment 2 Document in Chart   Glucose, capillary     Status: Abnormal   Collection Time: 02/26/15  8:30 PM  Result Value Ref Range   Glucose-Capillary 175 (H) 65 - 99 mg/dL  Glucose, capillary     Status: Abnormal   Collection Time: 02/27/15 12:22 AM  Result Value Ref Range   Glucose-Capillary 223 (H) 65 - 99 mg/dL   Comment 1 Notify RN   Basic metabolic panel     Status: Abnormal   Collection Time: 02/27/15  3:40 AM  Result Value Ref Range   Sodium 137 135 - 145 mmol/L   Potassium 4.3 3.5 - 5.1 mmol/L   Chloride 103 101 - 111 mmol/L   CO2 23 22 - 32 mmol/L   Glucose, Bld 257 (H) 65 - 99 mg/dL   BUN 23 (H) 6 - 20 mg/dL   Creatinine, Ser 1.17 0.61 - 1.24 mg/dL   Calcium 9.9 8.9 - 10.3 mg/dL   GFR calc non Af Amer >60 >60 mL/min   GFR calc Af Amer >60 >60 mL/min    Comment: (NOTE) The eGFR has been calculated using the CKD EPI equation. This calculation has not been validated in all clinical situations. eGFR's  persistently <60 mL/min signify possible Chronic Kidney Disease.    Anion gap 11 5 - 15  Magnesium     Status: None   Collection Time: 02/27/15  3:40 AM  Result Value Ref Range   Magnesium 2.1 1.7 - 2.4 mg/dL  Phosphorus     Status: Abnormal   Collection Time: 02/27/15  3:40 AM  Result Value Ref Range   Phosphorus 2.2 (L) 2.5 - 4.6 mg/dL  Glucose, capillary     Status: Abnormal   Collection Time: 02/27/15  4:18 AM  Result Value Ref Range   Glucose-Capillary 210 (H) 65 - 99 mg/dL  Glucose, capillary     Status: Abnormal   Collection Time: 02/27/15  7:43 AM  Result Value Ref Range   Glucose-Capillary 201 (H) 65 - 99 mg/dL    Imaging / Studies: Dg Chest Port 1 View  02/25/2015  CLINICAL DATA:  Acute respiratory acidosis following surgery. Hypertension. EXAM: PORTABLE CHEST 1 VIEW COMPARISON:  December 21, 2013 FINDINGS:  There is mild right base atelectatic change. There is no edema or consolidation. Heart is upper normal in size with pulmonary vascularity within normal limits. No adenopathy. There is a hiatal hernia. Nasogastric tube tip and side port are below the diaphragm. IMPRESSION: Hiatal hernia present. No edema or consolidation. There is mild right base atelectasis. Electronically Signed   By: Lowella Grip III M.D.   On: 02/25/2015 15:42    Medications / Allergies: per chart  Antibiotics: Anti-infectives    Start     Dose/Rate Route Frequency Ordered Stop   02/25/15 0600  ertapenem (INVANZ) 1 g in sodium chloride 0.9 % 50 mL IVPB     1 g 100 mL/hr over 30 Minutes Intravenous On call to O.R. 02/24/15 1332 02/25/15 0750        Note: Portions of this report may have been transcribed using voice recognition software. Every effort was made to ensure accuracy; however, inadvertent computerized transcription errors may be present.   Any transcriptional errors that result from this process are unintentional.     Adin Hector, M.D., F.A.C.S. Gastrointestinal and  Minimally Invasive Surgery [facility] [address], [place], [place] [address] [phone] Main / Paging   02/27/2015  CARE TEAM:  PCP:  Melinda Crutch, MD  Outpatient Care Team: Patient Care Team: Lona Kettle, MD as PCP - General (Family Medicine)  Inpatient Treatment Team: Treatment Team: Attending Provider: Armandina Gemma, MD; Rounding Team: Md Pccm, MD

## 2015-02-27 NOTE — Progress Notes (Signed)
RT spoke with Pt regarding BIPAP QHS.  Pt stated that he does not use it at home and does not want to use here tonight.  Pt currently on [place] via ETCO2 monitor and tolerating well.  RT to monitor and assess as needed.

## 2015-02-27 NOTE — Progress Notes (Signed)
Called and received report from Ginger, rn in ICU. Vwilliams,rn.

## 2015-02-27 NOTE — Progress Notes (Signed)
Name: Derek STEFFENSMEIER Sr. MRN: [id number] DOB: 04/18/1945    ADMISSION DATE:  02/25/2015 CONSULTATION DATE:  1/5  REFERRING MD :  Harlow Asa   CHIEF COMPLAINT:  Complex medical management post-op   BRIEF PATIENT DESCRIPTION:  70 year old male who was referred to general surgery for Neuro-endocrine tumor diagnosed in Aug 2016. He has a complex PMH including: GERD, anxiety, DM, OSA  & HTN. He is now s/p  distal pancreatectomy and splenectomy. PCCM was asked to a/w care.    SIGNIFICANT EVENTS    STUDIES:   SUBJECTIVE: c/o  pain .  C/o cough   VITAL SIGNS: Temp:  [98.2 F (36.8 C)-98.6 F (37 C)] 98.2 F (36.8 C) (01/07 0800) Pulse Rate:  [91-123] 91 (01/07 0600) Resp:  [14-26] 14 (01/07 0800) BP: (106-180)/(63-133) 119/75 mmHg (01/07 0600) SpO2:  [95 %-100 %] 98 % (01/07 0800) FiO2 (%):  [30 %] 30 % (01/07 0800) Weight:  [114.6 kg (252 lb 10.4 oz)] 114.6 kg (252 lb 10.4 oz) (01/07 0600) [place]  PHYSICAL EXAMINATION: General:  70 year old white male, c/o sig pain  Neuro:  Awake, follows commands. No focal def  HEENT:  NCAT, no JVD , NGT in place  Cardiovascular:  rrr w/out MRG Lungs:  Clear w/ out accessory muscle use  Abdomen:  Soft, abd dressing CD&I hypoactive. Tender to palp  Musculoskeletal:  Equal st and mass Skin:  Warm, dry and intact   CBC Recent Labs     02/25/15  2115  02/26/15  0334  WBC  13.7*  13.2*  HGB  13.0  12.8*  HCT  41.7  42.1  PLT  288  285    Coag's No results for input(s): APTT, INR in the last 72 hours.  BMET Recent Labs     02/25/15  2115  02/26/15  0334  02/27/15  0340  NA   --   138  137  K   --   5.1  4.3  CL   --   101  103  CO2   --   23  23  BUN   --   16  23*  CREATININE  1.14  1.01  1.17  GLUCOSE   --   143*  257*    Electrolytes Recent Labs     02/26/15  0334  02/27/15  0340  CALCIUM  9.8  9.9  MG   --   2.1  PHOS   --   2.2*    Sepsis Markers No results for input(s): PROCALCITON, O2SATVEN in the last 72  hours.  Invalid input(s): LACTICACIDVEN  ABG Recent Labs     02/25/15  1427  PHART  7.319*  PCO2ART  42.9  PO2ART  68.7*    Liver Enzymes No results for input(s): AST, ALT, ALKPHOS, BILITOT, ALBUMIN in the last 72 hours.  Cardiac Enzymes No results for input(s): TROPONINI, PROBNP in the last 72 hours.  Glucose Recent Labs     02/26/15  1203  02/26/15  1635  02/26/15  2030  02/27/15  0022  02/27/15  0418  02/27/15  0743  GLUCAP  150*  196*  175*  223*  210*  201*    Imaging Dg Chest Port 1 View  02/25/2015  CLINICAL DATA:  Acute respiratory acidosis following surgery. Hypertension. EXAM: PORTABLE CHEST 1 VIEW COMPARISON:  December 21, 2013 FINDINGS: There is mild right base atelectatic change. There is no edema or consolidation. Heart is upper  normal in size with pulmonary vascularity within normal limits. No adenopathy. There is a hiatal hernia. Nasogastric tube tip and side port are below the diaphragm. IMPRESSION: Hiatal hernia present. No edema or consolidation. There is mild right base atelectasis. Electronically Signed   By: Lowella Grip III M.D.   On: 02/25/2015 15:42   ASSESSMENT / PLAN:  Neuroendocrine Tumor of the Pancreas; now s/p distal pancreatectomy and splenectomy  Plan NPO-->diet per surgery  Post-op wound care per surg  PRN analgesia  Routine post-op pulm hygiene & mobilization , on PCA   HyperGlycemia w/ H/o DM.Marland Kitchen Had been diet controlled. Now s/p pancreatectomy so will need insulin Plan ssi   H/o OSA w/ intermittent episodes of Apnea in PACU-->does not use CPAP at home.  At risk for post-op atx  Plan Wean FIO2 Monitor End-tidal CO2 while on PCA Mobilize Add IS    H/o HTN Plan Added low dose scheduled BB -->change back to oral coreg when [facility] to use Gut  PRN hydralazine     PCCM to s/o Pl call as needed  Rigoberto Noel. MD   02/27/2015, 10:49 AM   02/27/2015 10:49 AM

## 2015-02-27 NOTE — Progress Notes (Addendum)
Spoke with pt regarding bipap qhs.  Pt stated that he does not use one at home and does not want to wear it tonight.  Pt also has NG tube in place.  Pt remains on 2.5L NCV via ETCO2 monitor.  HR95, spo2 97%.  Pt was advised that RT is available all night should he change his mind.

## 2015-02-28 LAB — GLUCOSE, CAPILLARY
Glucose-Capillary: 156 mg/dL — ABNORMAL HIGH (ref 65–99)
Glucose-Capillary: 164 mg/dL — ABNORMAL HIGH (ref 65–99)
Glucose-Capillary: 165 mg/dL — ABNORMAL HIGH (ref 65–99)
Glucose-Capillary: 165 mg/dL — ABNORMAL HIGH (ref 65–99)
Glucose-Capillary: 168 mg/dL — ABNORMAL HIGH (ref 65–99)
Glucose-Capillary: 195 mg/dL — ABNORMAL HIGH (ref 65–99)

## 2015-02-28 LAB — MAGNESIUM: Magnesium: 2.3 mg/dL (ref 1.7–2.4)

## 2015-02-28 LAB — PHOSPHORUS: Phosphorus: 2.3 mg/dL — ABNORMAL LOW (ref 2.5–4.6)

## 2015-02-28 MED ORDER — HYDROMORPHONE HCL 1 MG/ML IJ SOLN
0.5000 mg | INTRAMUSCULAR | Status: DC | PRN
  Administered 2015-02-28 – 2015-03-02 (×5): 1 mg via INTRAVENOUS
  Administered 2015-03-02: 2 mg via INTRAVENOUS
  Administered 2015-03-02 (×2): 1 mg via INTRAVENOUS
  Administered 2015-03-02: 2 mg via INTRAVENOUS
  Filled 2015-02-28: qty 1
  Filled 2015-02-28: qty 2
  Filled 2015-02-28 (×3): qty 1

## 2015-02-28 MED ORDER — INSULIN GLARGINE 100 UNIT/ML ~~LOC~~ SOLN
5.0000 [IU] | Freq: Two times a day (BID) | SUBCUTANEOUS | Status: DC
  Administered 2015-02-28 – 2015-03-02 (×6): 10 [IU] via SUBCUTANEOUS
  Administered 2015-03-03: 5 [IU] via SUBCUTANEOUS
  Filled 2015-02-28 (×7): qty 0.1

## 2015-02-28 NOTE — Progress Notes (Signed)
Pt stated that he accidentally pulled out his clamped NG tube.  He also said that he did not want to put another one in.

## 2015-02-28 NOTE — Progress Notes (Signed)
Pt not voided since foley removed before tx from ICU.  Pt encouraged to void, but stated he had no urge.  Bladder scanned approx 280 urine.  In and out pt per order, obtained approx 350 cc dark urine.  Will continue to monitor.

## 2015-02-28 NOTE — Progress Notes (Signed)
General Surgery Note  LOS: 3 days  POD -  3 Days Post-Op  Assessment/Plan: 1.  DISTAL PANCREATECTOMY AND SPLENECTOMY - 02/25/2015 - [place]  Pt pulled NGT this AM  No bowel function yet  Needs to ambulate more - IS not in room  2.  DM  Glucose 168 this AM  3.  HTN 4.  DVT prophylaxis - Lovenox 5.  Urinary retention  Required I&O caht - 350 cc of urine at MN  May need foley reinserted.  To walk patient and see if that helps 6.  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  Wants to get off PCA.  I will convert to IV pushes. Needs to ambulate more. Objective:   Filed Vitals:   02/27/15 2308 02/28/15 0000  BP: 134/75   Pulse: 95   Temp: 98.4 F (36.9 C)   Resp: 18 14     Intake/Output from previous day:  01/07 0701 - 01/08 0700 In: 1203.3 [I.V.:1203.3] Out: 1120 [Urine:1080; Drains:40]  Intake/Output this shift:      Physical Exam:   General: Obese WM who is alert and oriented.    HEENT: Normal. Pupils equal. .   Lungs: Clear, though modest inspiratory effort.  No IS in room (nurse will get this)   Abdomen: Mild distention.  Rare BS   Wound: Dressing dry and intact. Drain LUQ - 40 cc recorded last 24 hours.   Lab Results:    Recent Labs  02/25/15 2115 02/26/15 0334  WBC 13.7* 13.2*  HGB 13.0 12.8*  HCT 41.7 42.1  PLT 288 285    BMET   Recent Labs  02/26/15 0334 02/27/15 0340  NA 138 137  K 5.1 4.3  CL 101 103  CO2 23 23  GLUCOSE 143* 257*  BUN 16 23*  CREATININE 1.01 1.17  CALCIUM 9.8 9.9    PT/INR  No results for input(s): LABPROT, INR in the last 72 hours.  ABG   Recent Labs  02/25/15 1427  PHART 7.319*  HCO3 21.7     Studies/Results:  No results  found.   Anti-infectives:   Anti-infectives    Start     Dose/Rate Route Frequency Ordered Stop   02/25/15 0600  ertapenem (INVANZ) 1 g in sodium chloride 0.9 % 50 mL IVPB     1 g 100 mL/hr over 30 Minutes Intravenous On call to O.R. 02/24/15 1332 02/25/15 0750      Alphonsa Overall, MD, FACS Pager: [facility] Office: [phone] 02/28/2015

## 2015-02-28 NOTE — Progress Notes (Signed)
Pt refuses BIPAP QHS, RT to monitor and assess as needed.

## 2015-03-01 LAB — GLUCOSE, CAPILLARY
Glucose-Capillary: 126 mg/dL — ABNORMAL HIGH (ref 65–99)
Glucose-Capillary: 131 mg/dL — ABNORMAL HIGH (ref 65–99)
Glucose-Capillary: 156 mg/dL — ABNORMAL HIGH (ref 65–99)
Glucose-Capillary: 178 mg/dL — ABNORMAL HIGH (ref 65–99)
Glucose-Capillary: 186 mg/dL — ABNORMAL HIGH (ref 65–99)
Glucose-Capillary: 187 mg/dL — ABNORMAL HIGH (ref 65–99)
Glucose-Capillary: 202 mg/dL — ABNORMAL HIGH (ref 65–99)
Glucose-Capillary: 227 mg/dL — ABNORMAL HIGH (ref 65–99)

## 2015-03-01 LAB — TYPE AND SCREEN
ABO/RH(D): O POS
Antibody Screen: NEGATIVE
Unit division: 0
Unit division: 0

## 2015-03-01 LAB — HEMOGLOBIN A1C
Hgb A1c MFr Bld: 9.6 % — ABNORMAL HIGH (ref 4.8–5.6)
Mean Plasma Glucose: 229 mg/dL

## 2015-03-01 LAB — MAGNESIUM: Magnesium: 2.1 mg/dL (ref 1.7–2.4)

## 2015-03-01 LAB — PHOSPHORUS: Phosphorus: 2.2 mg/dL — ABNORMAL LOW (ref 2.5–4.6)

## 2015-03-01 MED ORDER — HYDROMORPHONE HCL 2 MG PO TABS
2.0000 mg | ORAL_TABLET | ORAL | Status: DC | PRN
  Administered 2015-03-02 (×2): 2 mg via ORAL
  Filled 2015-03-01 (×2): qty 1

## 2015-03-01 NOTE — Anesthesia Postprocedure Evaluation (Signed)
Anesthesia Post Note  Patient: Derek ROULAND Sr.  Procedure(s) Performed: Procedure(s) (LRB): DISTAL PANCREATECTOMY AND SPLENECTOMY (N/A)  Patient location during evaluation: PACU Anesthesia Type: General Level of consciousness: awake Pain management: pain level controlled Vital Signs Assessment: post-procedure vital signs reviewed and stable Respiratory status: spontaneous breathing Cardiovascular status: stable Postop Assessment: no signs of nausea or vomiting Anesthetic complications: no    Last Vitals:  Filed Vitals:   02/28/15 2150 03/01/15 0550  BP: 149/71 169/90  Pulse: 91 90  Temp: 36.9 C 36.8 C  Resp: 18 18    Last Pain:  Filed Vitals:   03/01/15 0715  PainSc: 3                  Joeanne Robicheaux

## 2015-03-01 NOTE — Progress Notes (Signed)
Patient ID: Derek Mohr Sr., male   DOB: [birth year], 70 y.o.   MRN: [id number]  [facility]  POD#: 4  Subjective: Patient on floor 5 [address].  Tolerating clear liquids.  Passing flatus, no BM.  Foley in place for urinary retention.  Complains of "burning" on right anterior thigh.  Objective: Vital signs in last 24 hours: Temp:  [98.2 F (36.8 C)-98.7 F (37.1 C)] 98.2 F (36.8 C) (01/09 0550) Pulse Rate:  [90-103] 90 (01/09 0550) Resp:  [18] 18 (01/09 0550) BP: (132-169)/(71-90) 169/90 mmHg (01/09 0550) SpO2:  [95 %-97 %] 97 % (01/09 0550) Last BM Date:  (PTA)  Intake/Output from previous day: 01/08 0701 - 01/09 0700 In: 2900 [P.O.:1200; I.V.:1400] Out: 1575 [Urine:1450; Drains:125] Intake/Output this shift: Total I/O In: -  Out: 15 [Drains:15]  Physical Exam: HEENT - sclerae clear, mucous membranes moist Neck - soft Chest - clear bilaterally Cor - RRR Abdomen - soft, active BS present; wound dry and intact - dressing removed; JP with small dark serous output Ext - no edema, non-tender; skin of right thigh grossly normal Neuro - alert & oriented, no focal deficits  Lab Results:  No results for input(s): WBC, HGB, HCT, PLT in the last 72 hours. BMET  Recent Labs  02/27/15 0340  NA 137  K 4.3  CL 103  CO2 23  GLUCOSE 257*  BUN 23*  CREATININE 1.17  CALCIUM 9.9   PT/INR No results for input(s): LABPROT, INR in the last 72 hours. Comprehensive Metabolic Panel:    Component Value Date/Time   NA 137 02/27/2015 0340   NA 138 02/26/2015 0334   K 4.3 02/27/2015 0340   K 5.1 02/26/2015 0334   CL 103 02/27/2015 0340   CL 101 02/26/2015 0334   CO2 23 02/27/2015 0340   CO2 23 02/26/2015 0334   BUN 23* 02/27/2015 0340   BUN 16 02/26/2015 0334   CREATININE 1.17 02/27/2015 0340   CREATININE 1.01 02/26/2015 0334   GLUCOSE 257* 02/27/2015 0340   GLUCOSE 143* 02/26/2015 0334   CALCIUM 9.9 02/27/2015 0340   CALCIUM 9.8  02/26/2015 0334   AST 19 02/19/2015 0920   AST 16 05/22/2013 0930   ALT 23 02/19/2015 0920   ALT 17 05/22/2013 0930   ALKPHOS 83 02/19/2015 0920   ALKPHOS 97 05/22/2013 0930   BILITOT 0.6 02/19/2015 0920   BILITOT 0.3 05/22/2013 0930   PROT 6.9 02/19/2015 0920   PROT 7.1 05/22/2013 0930   ALBUMIN 4.2 02/19/2015 0920   ALBUMIN 4.0 05/22/2013 0930    Studies/Results: No results found.  Anti-infectives: Anti-infectives    Start     Dose/Rate Route Frequency Ordered Stop   02/25/15 0600  ertapenem (INVANZ) 1 g in sodium chloride 0.9 % 50 mL IVPB     1 g 100 mL/hr over 30 Minutes Intravenous On call to O.R. 02/24/15 1332 02/25/15 0750      Assessment & Plans: Status post distal pancreatectomy and splenectomy  Path shows islet cell tumor, 1.4 cm, with negative margins  Resolving ileus - advance to full liquid diet today  Encouraged OOB, ambulation  Begin oral pain Rx Urinary retention  Leave Foley today Diabetes mellitus  Diabetic coordinator assisting with management  Likely home 2-3 days.  [name], MD, [facility] Office: [place] 03/01/2015

## 2015-03-02 ENCOUNTER — Encounter (HOSPITAL_COMMUNITY): Payer: Self-pay | Admitting: Surgery

## 2015-03-02 LAB — GLUCOSE, CAPILLARY
Glucose-Capillary: 135 mg/dL — ABNORMAL HIGH (ref 65–99)
Glucose-Capillary: 167 mg/dL — ABNORMAL HIGH (ref 65–99)
Glucose-Capillary: 176 mg/dL — ABNORMAL HIGH (ref 65–99)
Glucose-Capillary: 185 mg/dL — ABNORMAL HIGH (ref 65–99)
Glucose-Capillary: 186 mg/dL — ABNORMAL HIGH (ref 65–99)
Glucose-Capillary: 199 mg/dL — ABNORMAL HIGH (ref 65–99)
Glucose-Capillary: 218 mg/dL — ABNORMAL HIGH (ref 65–99)

## 2015-03-02 LAB — MAGNESIUM: Magnesium: 1.9 mg/dL (ref 1.7–2.4)

## 2015-03-02 LAB — PHOSPHORUS: Phosphorus: 2.6 mg/dL (ref 2.5–4.6)

## 2015-03-02 NOTE — Care Management Important Message (Signed)
Important Message  Patient Details IM Letter given to Suzanne/Case Manager to present to Patient Name: Derek EDMUNDSON Sr. MRN: [id number] Date of Birth: 11/26/1945   Medicare Important Message Given:  Yes    [name] 03/02/2015, 11:49 AM

## 2015-03-02 NOTE — Progress Notes (Signed)
Patient ID: Derek Mohr Sr., male   DOB: August 28, 1945, 70 y.o.   MRN: [id number]  [facility]  POD#: 5  Subjective: Patient up in chair, tolerating full liquids, no complaints.  Objective: Vital signs in last 24 hours: Temp:  [97.9 F (36.6 C)-99.4 F (37.4 C)] 98.5 F (36.9 C) (01/10 0641) Pulse Rate:  [80-94] 80 (01/10 0641) Resp:  [18] 18 (01/10 0641) BP: (129-145)/(74-79) 131/74 mmHg (01/10 0641) SpO2:  [95 %-97 %] 97 % (01/10 0641) Last BM Date:  (PTA)  Intake/Output from previous day: 01/09 0701 - 01/10 0700 In: 2550 [P.O.:2040; I.V.:450; IV Piggyback:60] Out: 3240 [Urine:3050; Drains:190] Intake/Output this shift:    Physical Exam: HEENT - sclerae clear, mucous membranes moist Neck - soft Chest - clear bilaterally Cor - RRR Abdomen - soft, active BS; wound dry and intact; JP drain with small thin dark serous Ext - no edema, non-tender Neuro - alert & oriented, no focal deficits  Lab Results:  No results for input(s): WBC, HGB, HCT, PLT in the last 72 hours. BMET No results for input(s): NA, K, CL, CO2, GLUCOSE, BUN, CREATININE, CALCIUM in the last 72 hours. PT/INR No results for input(s): LABPROT, INR in the last 72 hours. Comprehensive Metabolic Panel:    Component Value Date/Time   NA 137 02/27/2015 0340   NA 138 02/26/2015 0334   K 4.3 02/27/2015 0340   K 5.1 02/26/2015 0334   CL 103 02/27/2015 0340   CL 101 02/26/2015 0334   CO2 23 02/27/2015 0340   CO2 23 02/26/2015 0334   BUN 23* 02/27/2015 0340   BUN 16 02/26/2015 0334   CREATININE 1.17 02/27/2015 0340   CREATININE 1.01 02/26/2015 0334   GLUCOSE 257* 02/27/2015 0340   GLUCOSE 143* 02/26/2015 0334   CALCIUM 9.9 02/27/2015 0340   CALCIUM 9.8 02/26/2015 0334   AST 19 02/19/2015 0920   AST 16 05/22/2013 0930   ALT 23 02/19/2015 0920   ALT 17 05/22/2013 0930   ALKPHOS 83 02/19/2015 0920   ALKPHOS 97 05/22/2013 0930   BILITOT 0.6 02/19/2015 0920   BILITOT 0.3 05/22/2013 0930   PROT 6.9 02/19/2015 0920   PROT 7.1 05/22/2013 0930   ALBUMIN 4.2 02/19/2015 0920   ALBUMIN 4.0 05/22/2013 0930    Studies/Results: No results found.  Anti-infectives: Anti-infectives    Start     Dose/Rate Route Frequency Ordered Stop   02/25/15 0600  ertapenem (INVANZ) 1 g in sodium chloride 0.9 % 50 mL IVPB     1 g 100 mL/hr over 30 Minutes Intravenous On call to O.R. 02/24/15 1332 02/25/15 0750      Assessment & Plans: Status post distal pancreatectomy and splenectomy Path shows islet cell tumor, 1.4 cm, with negative margins Resolving ileus - advance to regular carb modified diet today Encouraged OOB, ambulation Begin oral pain Rx Urinary retention Discontinue Foley today Diabetes mellitus Diabetic coordinator assisting with management  Will likely go home on insulin  Likely home tomorrow.  [name], MD, [facility] Office: [place] 03/02/2015

## 2015-03-03 LAB — GLUCOSE, CAPILLARY
Glucose-Capillary: 136 mg/dL — ABNORMAL HIGH (ref 65–99)
Glucose-Capillary: 135 mg/dL — ABNORMAL HIGH (ref 65–99)
Glucose-Capillary: 295 mg/dL — ABNORMAL HIGH (ref 65–99)

## 2015-03-03 MED ORDER — HYDROMORPHONE HCL 2 MG PO TABS: 2.0000 mg | ORAL_TABLET | ORAL | Status: DC | PRN

## 2015-03-03 MED ORDER — INSULIN GLARGINE 100 UNIT/ML ~~LOC~~ SOLN: 20.0000 [IU] | Freq: Every day | SUBCUTANEOUS | Status: DC

## 2015-03-03 MED ORDER — INSULIN ASPART 100 UNIT/ML ~~LOC~~ SOLN: 0.0000 [IU] | Freq: Three times a day (TID) | SUBCUTANEOUS | Status: DC

## 2015-03-03 MED ORDER — INSULIN SYRINGES (DISPOSABLE) U-100 0.5 ML MISC: 20.0000 [IU] | Freq: Every day | Status: DC

## 2015-03-03 MED FILL — NovoLOG 100 UNIT/ML SOLN: 100 | 28 days supply | Qty: 10 | Fill #0

## 2015-03-03 MED FILL — HYDROmorphone HCL 2 MG TABS: 2 | 3 days supply | Qty: 30 | Fill #0

## 2015-03-03 MED FILL — SURE COMFORT 0.5 ML SYRINGE: 30G X 1/2" | 30 days supply | Qty: 100 | Fill #0

## 2015-03-03 MED FILL — LANTUS 100 UNITS/ML VIAL: 100 | 28 days supply | Qty: 10 | Fill #0

## 2015-03-03 NOTE — Discharge Summary (Signed)
Physician Discharge Summary [facility]  Patient ID: Derek Mohr Sr. MRN: [id number] DOB/AGE: Jan 06, 1946 70 y.o.  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  Post op course stable, initially managed in stepdown unit, then transferred to floor.  NG tube removed on POD#3.  Diet advanced to carb modified slowly.  Prepared for discharge on POD#6.  Consults: diabetes coordinator, CCM  Treatments: surgery: distal pancreatectomy and splenectomy for neuroendocrine tumor of the pancreas  Discharge Exam: Blood pressure 140/80, pulse 82, temperature 98.1 F (36.7 C), temperature source Oral, resp. rate 18, height 6\' 1"  (1.854 m), weight 114.6 kg (252 lb 10.4 oz), SpO2 97 %. HEENT - clear Neck - soft Chest - clear bilaterally Cor - RRR Abd - wound dry and intact with staples in place; active BS; JP drain removed this AM  Disposition: Home  Discharge Instructions    Ambulatory referral to Nutrition and Diabetic Education    Complete by:  As directed   poorly controlled DM, HgbA1C >10     Diet - low sodium heart healthy    Complete by:  As directed      Discharge instructions    Complete by:  As directed   [facility]  OPEN ABDOMINAL SURGERY: POST OP INSTRUCTIONS  Always review your discharge instruction sheet given to you by the facility where your surgery was performed.  A prescription for pain medication may be given to you upon  discharge.  Take your pain medication as prescribed.  If narcotic pain medicine is not needed, then you may take acetaminophen (Tylenol) or ibuprofen (Advil) as needed. Take your usually prescribed medications unless otherwise directed. If you need a refill on your pain medication, please contact your pharmacy. They will contact our office to request authorization.  Prescriptions will not be filled after 5 pm or on weekends. You should follow a light diet the first few days after arrival home, such as soup and crackers, unless your doctor has advised otherwise. A high-fiber, low fat diet can be resumed as tolerated.  Be sure to include plenty of fluids daily.  Most patients will experience some swelling and bruising in the area of the incision. Ice packs will help. Swelling and bruising can take several days to resolve. It is common to experience some constipation if taking pain medication after surgery.  Increasing fluid intake and taking a stool softener will usually help or prevent this problem from occurring.  A mild laxative (Milk of Magnesia or Miralax) should be taken according to package directions if there are no bowel movements after 48 hours.  You may have steri-strips (small skin tapes) in place directly over the incision.  These strips should be left on the skin for 5-7 days.  Any sutures or staples will be removed at the office during your follow-up visit. You may find that a light gauze bandage over your incision may keep your staples from being rubbed or pulled. You may shower and replace the bandage daily. ACTIVITIES:  You may  resume regular (light) daily activities beginning the next day - such as daily self-care, walking, climbing stairs - gradually increasing activities as tolerated.  You may have sexual intercourse when it is comfortable.  Refrain from any heavy lifting or straining until approved by your doctor.  You may drive when you no longer are taking prescription pain medication,  you can comfortably wear a seatbelt, and you can safely maneuver your car and apply brakes. You should see your doctor in the office for a follow-up appointment approximately 2-3 weeks after your surgery.  Make sure that you call for this appointment within a day or two after you arrive home to insure a convenient appointment time.  WHEN TO CALL YOUR DOCTOR: Fever greater than 101.0 Inability to urinate Persistent nausea and/or vomiting Extreme swelling or bruising Continued bleeding from incision Increased pain, redness, or drainage from the incision Difficulty swallowing or breathing Muscle cramping or spasms Numbness or tingling in hands or around lips  IF YOU HAVE DISABILITY OR FAMILY LEAVE FORMS, YOU MUST BRING THEM TO THE OFFICE FOR PROCESSING.  PLEASE DO NOT GIVE THEM TO YOUR DOCTOR.  The clinic staff is available to answer your questions during regular business hours.  Please don't hesitate to call and ask to speak to one of the nurses if you have concerns.  [facility] [place] Office: [phone]  For further questions, please visit [email or web address]     Increase activity slowly    Complete by:  As directed      Remove dressing in 24 hours    Complete by:  As directed             Medication List    TAKE these medications        ALEVE 220 MG tablet  Generic drug:  naproxen sodium  Take 440 mg by mouth 2 (two) times daily with a meal.     aspirin EC 81 MG tablet  Take 81 mg by mouth at bedtime.     carvedilol 25 MG tablet  Commonly known as:  COREG  Take 25 mg by mouth 2 (two) times daily with a meal.     clobetasol ointment 0.05 %  Commonly known as:  TEMOVATE  Apply 1 application topically daily as needed (for peeling skin on hands and feet).     [employer] TRIPLE MAGNESIUM COMPLEX PO  Take 1-2 capsules by mouth 2 (two) times daily. Two capsules in the morning and one at night     diphenhydrAMINE 25 mg capsule  Commonly known as:   BENADRYL  Take 50 mg by mouth 2 (two) times daily. Scheduled doses for allergies.     glimepiride 4 MG tablet  Commonly known as:  AMARYL  Take 4 mg by mouth daily with breakfast.     HYDROcodone-acetaminophen 7.5-325 MG tablet  Commonly known as:  NORCO  Take 1 tablet by mouth every 8 (eight) hours as needed for moderate pain.     HYDROmorphone 2 MG tablet  Commonly known as:  DILAUDID  Take 1 tablet (2 mg total) by mouth every 3 (three) hours as needed for moderate pain or severe pain.     INVOKANA 100 MG Tabs tablet  Generic drug:  canagliflozin  Take 100 mg by mouth at bedtime.     lansoprazole 15 MG capsule  Commonly known as:  PREVACID  Take 30 mg by mouth daily as needed (reflux).     losartan 50 MG tablet  Commonly known as:  COZAAR  Take 50 mg by mouth daily.     metFORMIN 500 MG 24 hr tablet  Commonly known as:  GLUCOPHAGE-XR  Take 1,000 mg by mouth 2 (two) times daily.      ASK your doctor about these medications        mupirocin ointment 2 %  Commonly known as:  BACTROBAN  Apply to nares area 3 times daily  Ask about: Should I take this medication?           Follow-up Information    Follow up with [name], MD. Schedule an appointment as soon as possible for a visit in 5 days.   Specialty:  General Surgery   Why:  For suture removal   Contact information:   [address] [place] [address] [phone]       [name], MD, [facility] Office: [phone]   Signed: [name] 03/03/2015, 7:59 AM

## 2015-03-03 NOTE — Progress Notes (Addendum)
Called by RN caring for patient today.  Per RN, patient thought he would be discharged home on insulin, however, Dr. Harlow Asa did not give patient any Rxs for insulin on AVS.  Reviewed patient's CBGs and insulin requirements over the last few days.  Patient has received a total of 20 units Lantus for the last several days and has also been getting Novolog Resistant SSI as well.  Good glucose control with this current regimen.  Talked with Haynes Dage, RN caring for patient.  Haynes Dage requested I call Dr. Harlow Asa to discuss patient's home DM medication regimen.  Spoke with Dr. Harlow Asa by phone.  Reviewed above information.  After collaboration, decision made to d/c patient's home oral DM meds (Invokana, Metformin, and Amaryl) and to start patient on Lantus 20 units daily along with Novolog Sensitive SSI (0-9 units) TID AC.  Patient to follow up with Dr. Harrington Challenger soon after d/c to further review DM meds.  Asked RN to please print a copy of the Sensitive SSI for patient and review how to take insulin at home.  RN to provide insulin instruction with patient before d/c.   Spoke with patient's wife by phone.  Reviewed all of the above information with her and also reviewed the DM medication discharge plan.  Instructed patient's wife that they will stop the three oral DM meds (Invokana, Metformin, and Amaryl) and start Lantus 20 units daily + Novolog SSI TID AC.  Discussed what these two insulins are and how they work and when to take.  RN to further review with wife and patient as well.  Encouraged patient's wife to have patient follow up with Dr. Harrington Challenger asap after discharge and to also seek referral to local ENDO for pt's DM needs.  Patient's wife very agreeable to plan.    --Will follow patient during hospitalization--  Wyn Quaker RN, MSN, CDE Diabetes Coordinator Inpatient Glycemic Control Team Team Pager: [phone] (8a-5p)'

## 2015-03-09 MED FILL — HYDROmorphone HCL 2 MG TABS: 2 | 10 days supply | Qty: 30 | Fill #0

## 2015-03-15 MED FILL — ONE TOUCH ULTRA TEST STRIPS: 90 days supply | Qty: 100 | Fill #1

## 2015-03-17 ENCOUNTER — Ambulatory Visit (INDEPENDENT_AMBULATORY_CARE_PROVIDER_SITE_OTHER): Payer: PPO | Admitting: Endocrinology

## 2015-03-17 ENCOUNTER — Encounter: Payer: Self-pay | Admitting: Endocrinology

## 2015-03-17 VITALS — BP 128/84 | HR 83 | Temp 98.2°F | Ht 71.5 in | Wt 244.0 lb

## 2015-03-17 DIAGNOSIS — E1165 Type 2 diabetes mellitus with hyperglycemia: Secondary | ICD-10-CM

## 2015-03-17 MED ORDER — ONETOUCH DELICA LANCETS 33G MISC: 1.0000 | Freq: Four times a day (QID) | Status: DC

## 2015-03-17 MED FILL — ONE TOUCH DELICA 33G LANCET: 25 days supply | Qty: 100 | Fill #0

## 2015-03-17 NOTE — Progress Notes (Signed)
Subjective:    Patient ID: Derek Ache., male    DOB: 01-15-1946, 70 y.o.   MRN: [id number]  HPI pt states DM was dx'ed in approx 2007; he has moderate neuropathy of the lower extremities; he is unaware of any associated chronic complications; he has been on insulin since distal pancreatectomy 3 weeks ago; since then, he has lost 25 lbs.  pt says his exercise is limited by recovery from surgery; he has never had severe hypoglycemia or DKA.  He takes lantus 20 units qd, and prn novolog (averages a total of approx 12 units per day).   he brings a record of his cbg's which i have reviewed today.  It averages 172-310.  There is no trend throughout the day.   Past Medical History  Diagnosis Date  . Hypertension   . Diabetes mellitus   . GERD (gastroesophageal reflux disease)   . Meniere's disease   . Morbid obesity ([place])   . Depression   . Arthritis   . 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 Anxiety     prone to "panic attacks"  . 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 History of kidney stones     multiple kidney stones-not a problem at present  . Kidney stone   . Dermatitis     bilateral hands  . BPH (benign prostatic hyperplasia)   . Shortness of breath dyspnea   . 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 DDD (degenerative disc disease), lumbar     gets back injections   . Neuroendocrine tumor of pancreas ([place])   . Straining on urination   . Obstructive sleep apnea     " i used to have sleep apnea" never used a c-pap   . Cancer [facility])     newly diagnosed pancreatic cancer    Past Surgical History  Procedure Laterality Date  . Knee arthroscopy Right 2002    rt knee  . Rotator cuff repair  '93bilateral '94 lt    x3  . Umbilical hernia repair  2000  . Knee arthroscopy  03/01/2012    Procedure: ARTHROSCOPY KNEE;  Surgeon: Gearlean Alf, MD;  Location: [facility];  Service: Orthopedics;   Laterality: Left;  WITH SYNOVECTOMY   . Ankle surgery Left 2010    left-fx- some retained hardware as of 05-22-13  . Hardware removal Left 2013    ankle-lt  . Anal fistulectomy N/A 04/01/2013    Procedure: EXAM UNDER ANESTHESIA WITH ANAL FISSURectomy, sphincterotomy and internal hemorrhoidectomy;  Surgeon: Harl Bowie, MD;  Location: [place];  Service: General;  Laterality: N/A;  . Replacement total knee Left 2009    knee-left  . Total knee arthroplasty Right 06/02/2013    Procedure: RIGHT TOTAL KNEE ARTHROPLASTY;  Surgeon: Gearlean Alf, MD;  Location: [facility];  Service: Orthopedics;  Laterality: Right;  . Colonoscopy    . Upper gastrointestinal endoscopy  sept 2016  . Inguinal hernia repair  '74,'82    x 2, inguinal  . Eus N/A 12/31/2014    Procedure: UPPER ENDOSCOPIC ULTRASOUND (EUS) LINEAR;  Surgeon: Milus Banister, MD;  Location: [facility];  Service: Endoscopy;  Laterality: N/A;    Social History   Social History  . Marital Status: Married    Spouse Name: N/A  . Number of Children: 2  . Years of Education:  N/A   Occupational History  . retired     Social History Main Topics  . Smoking status: Former Smoker -- 4.00 packs/day for 17 years    Types: Cigarettes    Quit date: 02/21/1975  . Smokeless tobacco: Never Used  . Alcohol Use: No  . Drug Use: No  . Sexual Activity: Yes   Other Topics Concern  . Not on file   Social History Narrative   Married, retired Scientist, product/process development   1 son 1 daughter   2 caffeine drinks/day   10/18/2014       Current Outpatient Prescriptions on File Prior to Visit  Medication Sig Dispense Refill  . aspirin EC 81 MG tablet Take 81 mg by mouth at bedtime.    . carvedilol (COREG) 25 MG tablet Take 25 mg by mouth 2 (two) times daily with a meal.     . [employer] TRIPLE MAGNESIUM COMPLEX PO Take 1-2 capsules by mouth 2 (two) times daily. Two capsules in the morning and one at night    . insulin aspart (NOVOLOG) 100 UNIT/ML  injection Inject 0-9 Units into the skin 3 (three) times daily before meals. 10 mL 11  . insulin glargine (LANTUS) 100 UNIT/ML injection Inject 0.2 mLs (20 Units total) into the skin daily. 10 mL 11  . Insulin Syringes, Disposable, U-100 0.5 ML MISC 20 Units by Does not apply route daily. 100 each 1  . losartan (COZAAR) 50 MG tablet Take 50 mg by mouth daily.  4  . naproxen sodium (ALEVE) 220 MG tablet Take 440 mg by mouth 2 (two) times daily with a meal.     . clobetasol ointment (TEMOVATE) [id number] % Apply 1 application topically daily as needed (for peeling skin on hands and feet). Reported on 03/17/2015    . diphenhydrAMINE (BENADRYL) 25 mg capsule Take 50 mg by mouth 2 (two) times daily. Reported on 03/17/2015    . HYDROcodone-acetaminophen (NORCO) 7.5-325 MG tablet Take 1 tablet by mouth every 8 (eight) hours as needed for moderate pain. Reported on 03/17/2015    . HYDROmorphone (DILAUDID) 2 MG tablet Take 1 tablet (2 mg total) by mouth every 3 (three) hours as needed for moderate pain or severe pain. (Patient not taking: Reported on 03/17/2015) 30 tablet 0  . lansoprazole (PREVACID) 15 MG capsule Take 30 mg by mouth daily as needed (reflux). Reported on 03/17/2015     No current facility-administered medications on file prior to visit.    Allergies  Allergen Reactions  . Morphine And Related Nausea And Vomiting  . Oxycontin [Oxycodone Hcl] Swelling    Lips swells  . Quinine Other (See Comments)    Platelets dropped  . Voltaren [Diclofenac Sodium] Other (See Comments)    Elevated liver enzymes    Family History  Problem Relation Age of Onset  . Factor V Leiden deficiency Daughter   . Diabetes Maternal Grandmother   . Heart disease Paternal Uncle   . Bone cancer Father     Jaw  . Colon cancer Neg Hx   . Esophageal cancer Neg Hx   . Stomach cancer Neg Hx   . Rectal cancer Neg Hx     BP 128/84 mmHg  Pulse 83  Temp(Src) 98.2 F (36.8 C) (Oral)  Ht 5' 11.5" (1.816 m)  Wt 244 lb  (110.678 kg)  BMI 33.56 kg/m2  SpO2 94%   Review of Systems denies fever, blurry vision, headache, chest pain, sob, n/v, urinary frequency, muscle cramps, excessive diaphoresis, cold  intolerance, rhinorrhea, and easy bruising.  He has several chronic sxs: prod cough, nasal congestion, depression, and heartburn.      Objective:   Physical Exam VS: see vs page GEN: no distress HEAD: head: no deformity eyes: no periorbital swelling, no proptosis external nose and ears are normal mouth: no lesion seen NECK: supple, thyroid is not enlarged CHEST WALL: no deformity LUNGS: clear to auscultation BREASTS:  No gynecomastia CV: reg rate and rhythm, no murmur ABD: healing surgical scar is noted MUSCULOSKELETAL: muscle bulk and strength are grossly normal.  no obvious joint swelling.  gait is steady with a cane.  EXTEMITIES: no deformity.  no ulcer on the feet.  feet are of normal color and temp.  no edema PULSES: dorsalis pedis intact bilat.  no carotid bruit NEURO:  cn 2-12 grossly intact.   readily moves all 4's.  sensation is intact to touch on the feet SKIN:  Normal texture and temperature.  No rash or suspicious lesion is visible.   NODES:  None palpable at the neck PSYCH: alert, well-oriented.  Does not appear anxious nor depressed.   Lab Results  Component Value Date   CREATININE 1.17 02/27/2015   BUN 23* 02/27/2015   NA 137 02/27/2015   K 4.3 02/27/2015   CL 103 02/27/2015   CO2 23 02/27/2015    I have reviewed outside records, and summarized: Pt was noted to have elevated a1c, and referred here.  Lab Results  Component Value Date   HGBA1C 9.6* 02/28/2015      Assessment & Plan:  DM: has become insulin dependent since distal pancreatectomy.  he needs increased rx.  Fatigue: new to me, prob multifactorial.     Patient is advised the following: Patient Instructions  good diet and exercise significantly improve the control of your diabetes.  please let me know if you  wish to be referred to a dietician.  high blood sugar is very risky to your health.  you should see an eye doctor and dentist every year.  It is very important to get all recommended vaccinations.  controlling your blood pressure and cholesterol drastically reduces the damage diabetes does to your body.  Those who smoke should quit.  please discuss these with your doctor.  check your blood sugar 4 times a day: before the 3 meals, and at bedtime.  also check if you have symptoms of your blood sugar being too high or too low.  please keep a record of the readings and bring it to your next appointment here (or you can bring the meter itself).  You can write it on any piece of paper.  please call us sooner if your blood sugar goes below 70, or if you have a lot of readings over 200.  For now: Please continue the same lantus, and: Increase the novolog to 8 units 3 times a day (with each meal, no matter hat your blood sugar is). Please call in 2 days, to tell us how the blood sugar is doing. Please come back for a follow-up appointment in 2 weeks.

## 2015-03-17 NOTE — Patient Instructions (Addendum)
good diet and exercise significantly improve the control of your diabetes.  please let me know if you wish to be referred to a dietician.  high blood sugar is very risky to your health.  you should see an eye doctor and dentist every year.  It is very important to get all recommended vaccinations.  controlling your blood pressure and cholesterol drastically reduces the damage diabetes does to your body.  Those who smoke should quit.  please discuss these with your doctor.  check your blood sugar 4 times a day: before the 3 meals, and at bedtime.  also check if you have symptoms of your blood sugar being too high or too low.  please keep a record of the readings and bring it to your next appointment here (or you can bring the meter itself).  You can write it on any piece of paper.  please call us sooner if your blood sugar goes below 70, or if you have a lot of readings over 200.  For now: Please continue the same lantus, and: Increase the novolog to 8 units 3 times a day (with each meal, no matter hat your blood sugar is). Please call in 2 days, to tell us how the blood sugar is doing. Please come back for a follow-up appointment in 2 weeks.

## 2015-03-18 ENCOUNTER — Encounter: Payer: Self-pay | Admitting: Surgery

## 2015-03-18 ENCOUNTER — Observation Stay (HOSPITAL_COMMUNITY): Payer: PPO

## 2015-03-18 ENCOUNTER — Inpatient Hospital Stay (HOSPITAL_COMMUNITY)
Admission: AD | Admit: 2015-03-18 | Discharge: 2015-04-06 | DRG: 372 | Disposition: A | Discharge status: Skilled Nursing Facility | Payer: PPO | Source: Ambulatory Visit | Attending: Surgery | Admitting: Surgery

## 2015-03-18 DIAGNOSIS — Z87891 Personal history of nicotine dependence: Secondary | ICD-10-CM | POA: Diagnosis not present

## 2015-03-18 DIAGNOSIS — Y838 Other surgical procedures as the cause of abnormal reaction of the patient, or of later complication, without mention of misadventure at the time of the procedure: Secondary | ICD-10-CM | POA: Diagnosis not present

## 2015-03-18 DIAGNOSIS — A419 Sepsis, unspecified organism: Secondary | ICD-10-CM

## 2015-03-18 DIAGNOSIS — G8929 Other chronic pain: Secondary | ICD-10-CM | POA: Diagnosis present

## 2015-03-18 DIAGNOSIS — Z9889 Other specified postprocedural states: Secondary | ICD-10-CM

## 2015-03-18 DIAGNOSIS — D3A8 Other benign neuroendocrine tumors: Secondary | ICD-10-CM | POA: Diagnosis present

## 2015-03-18 DIAGNOSIS — H9191 Unspecified hearing loss, right ear: Secondary | ICD-10-CM | POA: Diagnosis not present

## 2015-03-18 DIAGNOSIS — E1165 Type 2 diabetes mellitus with hyperglycemia: Secondary | ICD-10-CM | POA: Diagnosis not present

## 2015-03-18 DIAGNOSIS — K651 Peritoneal abscess: Secondary | ICD-10-CM | POA: Diagnosis not present

## 2015-03-18 DIAGNOSIS — D72829 Elevated white blood cell count, unspecified: Secondary | ICD-10-CM | POA: Diagnosis not present

## 2015-03-18 DIAGNOSIS — D7589 Other specified diseases of blood and blood-forming organs: Secondary | ICD-10-CM | POA: Diagnosis present

## 2015-03-18 DIAGNOSIS — Z9081 Acquired absence of spleen: Secondary | ICD-10-CM | POA: Diagnosis not present

## 2015-03-18 DIAGNOSIS — B958 Unspecified staphylococcus as the cause of diseases classified elsewhere: Secondary | ICD-10-CM | POA: Diagnosis present

## 2015-03-18 DIAGNOSIS — R188 Other ascites: Secondary | ICD-10-CM | POA: Diagnosis present

## 2015-03-18 DIAGNOSIS — Z6830 Body mass index (BMI) 30.0-30.9, adult: Secondary | ICD-10-CM | POA: Diagnosis not present

## 2015-03-18 DIAGNOSIS — D75839 Thrombocytosis, unspecified: Secondary | ICD-10-CM | POA: Insufficient documentation

## 2015-03-18 DIAGNOSIS — T814XXA Infection following a procedure, initial encounter: Secondary | ICD-10-CM | POA: Diagnosis not present

## 2015-03-18 DIAGNOSIS — Z87442 Personal history of urinary calculi: Secondary | ICD-10-CM

## 2015-03-18 DIAGNOSIS — Z833 Family history of diabetes mellitus: Secondary | ICD-10-CM

## 2015-03-18 DIAGNOSIS — Z90411 Acquired partial absence of pancreas: Secondary | ICD-10-CM | POA: Diagnosis not present

## 2015-03-18 DIAGNOSIS — M545 Low back pain: Secondary | ICD-10-CM | POA: Diagnosis present

## 2015-03-18 DIAGNOSIS — E739 Lactose intolerance, unspecified: Secondary | ICD-10-CM | POA: Diagnosis not present

## 2015-03-18 DIAGNOSIS — D473 Essential (hemorrhagic) thrombocythemia: Secondary | ICD-10-CM | POA: Insufficient documentation

## 2015-03-18 DIAGNOSIS — D499 Neoplasm of unspecified behavior of unspecified site: Secondary | ICD-10-CM | POA: Diagnosis not present

## 2015-03-18 DIAGNOSIS — R05 Cough: Secondary | ICD-10-CM | POA: Diagnosis not present

## 2015-03-18 DIAGNOSIS — R0602 Shortness of breath: Secondary | ICD-10-CM

## 2015-03-18 DIAGNOSIS — Z79899 Other long term (current) drug therapy: Secondary | ICD-10-CM | POA: Diagnosis not present

## 2015-03-18 DIAGNOSIS — R5381 Other malaise: Secondary | ICD-10-CM | POA: Insufficient documentation

## 2015-03-18 DIAGNOSIS — E119 Type 2 diabetes mellitus without complications: Secondary | ICD-10-CM | POA: Diagnosis not present

## 2015-03-18 DIAGNOSIS — R109 Unspecified abdominal pain: Secondary | ICD-10-CM | POA: Diagnosis not present

## 2015-03-18 DIAGNOSIS — Z7982 Long term (current) use of aspirin: Secondary | ICD-10-CM | POA: Diagnosis not present

## 2015-03-18 DIAGNOSIS — I959 Hypotension, unspecified: Secondary | ICD-10-CM | POA: Diagnosis not present

## 2015-03-18 DIAGNOSIS — E46 Unspecified protein-calorie malnutrition: Secondary | ICD-10-CM | POA: Diagnosis not present

## 2015-03-18 DIAGNOSIS — I10 Essential (primary) hypertension: Secondary | ICD-10-CM | POA: Diagnosis not present

## 2015-03-18 DIAGNOSIS — R4182 Altered mental status, unspecified: Secondary | ICD-10-CM | POA: Diagnosis not present

## 2015-03-18 DIAGNOSIS — T859XXA Unspecified complication of internal prosthetic device, implant and graft, initial encounter: Secondary | ICD-10-CM | POA: Diagnosis not present

## 2015-03-18 DIAGNOSIS — Z8249 Family history of ischemic heart disease and other diseases of the circulatory system: Secondary | ICD-10-CM

## 2015-03-18 DIAGNOSIS — E440 Moderate protein-calorie malnutrition: Secondary | ICD-10-CM | POA: Diagnosis not present

## 2015-03-18 DIAGNOSIS — Z4682 Encounter for fitting and adjustment of non-vascular catheter: Secondary | ICD-10-CM | POA: Diagnosis not present

## 2015-03-18 DIAGNOSIS — G4733 Obstructive sleep apnea (adult) (pediatric): Secondary | ICD-10-CM | POA: Diagnosis present

## 2015-03-18 DIAGNOSIS — Z96651 Presence of right artificial knee joint: Secondary | ICD-10-CM | POA: Diagnosis not present

## 2015-03-18 DIAGNOSIS — R1110 Vomiting, unspecified: Secondary | ICD-10-CM | POA: Diagnosis not present

## 2015-03-18 DIAGNOSIS — K219 Gastro-esophageal reflux disease without esophagitis: Secondary | ICD-10-CM | POA: Diagnosis present

## 2015-03-18 DIAGNOSIS — Z794 Long term (current) use of insulin: Secondary | ICD-10-CM

## 2015-03-18 DIAGNOSIS — R058 Other specified cough: Secondary | ICD-10-CM

## 2015-03-18 DIAGNOSIS — J90 Pleural effusion, not elsewhere classified: Secondary | ICD-10-CM

## 2015-03-18 DIAGNOSIS — Z9689 Presence of other specified functional implants: Secondary | ICD-10-CM

## 2015-03-18 DIAGNOSIS — J811 Chronic pulmonary edema: Secondary | ICD-10-CM | POA: Diagnosis not present

## 2015-03-18 DIAGNOSIS — R1012 Left upper quadrant pain: Secondary | ICD-10-CM | POA: Diagnosis not present

## 2015-03-18 DIAGNOSIS — R918 Other nonspecific abnormal finding of lung field: Secondary | ICD-10-CM | POA: Diagnosis not present

## 2015-03-18 DIAGNOSIS — R1909 Other intra-abdominal and pelvic swelling, mass and lump: Secondary | ICD-10-CM | POA: Diagnosis not present

## 2015-03-18 LAB — URINALYSIS, ROUTINE W REFLEX MICROSCOPIC
Bilirubin Urine: NEGATIVE
Glucose, UA: >1000 mg/dL — AB
Hgb urine dipstick: NEGATIVE
Ketones, ur: NEGATIVE mg/dL
Leukocytes, UA: NEGATIVE
Nitrite: NEGATIVE
Protein, ur: NEGATIVE mg/dL
Specific Gravity, Urine: 1.026 (ref 1.005–1.030)
pH: 5.5 (ref 5.0–8.0)

## 2015-03-18 LAB — CBC WITH DIFFERENTIAL/PLATELET
Basophils Absolute: 0.1 10*3/uL (ref 0.0–0.1)
Basophils Relative: 1 %
Eosinophils Absolute: 0.1 10*3/uL (ref 0.0–0.7)
Eosinophils Relative: 1 %
HCT: 33 % — ABNORMAL LOW (ref 39.0–52.0)
Hemoglobin: 10.4 g/dL — ABNORMAL LOW (ref 13.0–17.0)
Lymphocytes Relative: 19 %
Lymphs Abs: 2.6 10*3/uL (ref 0.7–4.0)
MCH: 23.6 pg — ABNORMAL LOW (ref 26.0–34.0)
MCHC: 31.5 g/dL (ref 30.0–36.0)
MCV: 75 fL — ABNORMAL LOW (ref 78.0–100.0)
Monocytes Absolute: 1.8 10*3/uL — ABNORMAL HIGH (ref 0.1–1.0)
Monocytes Relative: 13 %
Neutro Abs: 9.3 10*3/uL — ABNORMAL HIGH (ref 1.7–7.7)
Neutrophils Relative %: 66 %
Platelets: 956 10*3/uL (ref 150–400)
RBC: 4.4 MIL/uL (ref 4.22–5.81)
RDW: 14.6 % (ref 11.5–15.5)
WBC: 13.9 10*3/uL — ABNORMAL HIGH (ref 4.0–10.5)

## 2015-03-18 LAB — COMPREHENSIVE METABOLIC PANEL
ALT: 31 U/L (ref 17–63)
AST: 34 U/L (ref 15–41)
Albumin: 2.7 g/dL — ABNORMAL LOW (ref 3.5–5.0)
Alkaline Phosphatase: 147 U/L — ABNORMAL HIGH (ref 38–126)
Anion gap: 8 (ref 5–15)
BUN: 17 mg/dL (ref 6–20)
CO2: 25 mmol/L (ref 22–32)
Calcium: 10.4 mg/dL — ABNORMAL HIGH (ref 8.9–10.3)
Chloride: 99 mmol/L — ABNORMAL LOW (ref 101–111)
Creatinine, Ser: 0.85 mg/dL (ref 0.61–1.24)
GFR calc Af Amer: >60 mL/min (ref >60)
GFR calc non Af Amer: >60 mL/min (ref >60)
Glucose, Bld: 243 mg/dL — ABNORMAL HIGH (ref 65–99)
Potassium: 4.7 mmol/L (ref 3.5–5.1)
Sodium: 132 mmol/L — ABNORMAL LOW (ref 135–145)
Total Bilirubin: 0.7 mg/dL (ref 0.3–1.2)
Total Protein: 7.1 g/dL (ref 6.5–8.1)

## 2015-03-18 LAB — URINE MICROSCOPIC-ADD ON: RBC / HPF: NONE SEEN RBC/hpf (ref 0–5)

## 2015-03-18 LAB — PROTIME-INR
INR: 1.33 (ref 0.00–1.49)
Prothrombin Time: 16.1 seconds — ABNORMAL HIGH (ref 11.6–15.2)

## 2015-03-18 MED ORDER — ONDANSETRON 4 MG PO TBDP
4.0000 mg | ORAL_TABLET | Freq: Four times a day (QID) | ORAL | Status: DC | PRN
  Filled 2015-03-18: qty 1

## 2015-03-18 MED ORDER — PANTOPRAZOLE SODIUM 40 MG IV SOLR
40.0000 mg | Freq: Every day | INTRAVENOUS | Status: DC
  Administered 2015-03-18 – 2015-03-25 (×8): 40 mg via INTRAVENOUS
  Filled 2015-03-18 (×9): qty 40

## 2015-03-18 MED ORDER — KCL IN DEXTROSE-NACL 20-5-0.45 MEQ/L-%-% IV SOLN
INTRAVENOUS | Status: DC
  Administered 2015-03-18 – 2015-03-19 (×2): via INTRAVENOUS
  Administered 2015-03-19: 1000 mL via INTRAVENOUS
  Administered 2015-03-20: via INTRAVENOUS
  Filled 2015-03-18 (×7): qty 1000

## 2015-03-18 MED ORDER — ONDANSETRON HCL 4 MG/2ML IJ SOLN
4.0000 mg | Freq: Four times a day (QID) | INTRAMUSCULAR | Status: DC | PRN
  Administered 2015-03-18 – 2015-04-05 (×7): 4 mg via INTRAVENOUS
  Filled 2015-03-18 (×8): qty 2

## 2015-03-18 MED ORDER — FENTANYL CITRATE (PF) 100 MCG/2ML IJ SOLN
25.0000 ug | INTRAMUSCULAR | Status: DC | PRN
  Administered 2015-03-18 (×2): 50 ug via INTRAVENOUS
  Administered 2015-03-18: 25 ug via INTRAVENOUS
  Administered 2015-03-18 – 2015-03-19 (×4): 50 ug via INTRAVENOUS
  Filled 2015-03-18 (×7): qty 2

## 2015-03-18 NOTE — Progress Notes (Signed)
  Subjective: Complains of chronic back pain and new LUQ abdominal pain with vomiting  Objective: Vital signs in last 24 hours: Temp:  [98.6 F (37 C)-98.9 F (37.2 C)] 98.9 F (37.2 C) (01/26 2145) Pulse Rate:  [81-84] 84 (01/26 2145) Resp:  [18] 18 (01/26 2145) BP: (136-140)/(66-73) 140/73 mmHg (01/26 2145) SpO2:  [94 %-99 %] 94 % (01/26 2145) Last BM Date: 03/17/15  Intake/Output from previous day:   Intake/Output this shift: Total I/O In: 557.5 [P.O.:120; I.V.:437.5] Out: -   Resp: clear to auscultation bilaterally Cardio: regular rate and rhythm GI: soft, focally tendern in LUQ  Lab Results:   Recent Labs  03/18/15 1859  WBC 13.9*  HGB 10.4*  HCT 33.0*  PLT 956*   BMET  Recent Labs  03/18/15 1859  NA 132*  K 4.7  CL 99*  CO2 25  GLUCOSE 243*  BUN 17  CREATININE 0.85  CALCIUM 10.4*   PT/INR  Recent Labs  03/18/15 1859  LABPROT 16.1*  INR 1.33   ABG No results for input(s): PHART, HCO3 in the last 72 hours.  Invalid input(s): PCO2, PO2  Studies/Results: Dg Chest 2 View  03/18/2015  CLINICAL DATA:  Productive cough for 2 days EXAM: CHEST  2 VIEW COMPARISON:  Radiograph 02/25/2015 FINDINGS: The cardiac silhouette is normal. The ascending aorta is ectatic. No effusion, infiltrate or pneumothorax. IMPRESSION: 1. No acute cardiopulmonary findings. 2. Ectatic ascending aorta. Electronically Signed   By: Suzy Bouchard M.D.   On: 03/18/2015 18:32    Anti-infectives: Anti-infectives    None      Assessment/Plan: s/p * No surgery found * Monitor tonight. Consider imaging of abdomen tomorrow. Will discuss with Dr. Modena Jansky Joneen Boers 03/18/2015

## 2015-03-18 NOTE — Progress Notes (Signed)
Lab called to notify staff of a critical plt count of 956,000.  Report given to Sharene Butters, RN

## 2015-03-18 NOTE — Progress Notes (Signed)
Received report from [place], that lab called a critical value for Derek [name], Derek [name] platelet level 956,000. Notified  on call provider .

## 2015-03-19 ENCOUNTER — Telehealth: Payer: Self-pay | Admitting: Endocrinology

## 2015-03-19 ENCOUNTER — Encounter (HOSPITAL_COMMUNITY): Payer: Self-pay | Admitting: *Deleted

## 2015-03-19 ENCOUNTER — Ambulatory Visit (HOSPITAL_COMMUNITY): Payer: PPO

## 2015-03-19 ENCOUNTER — Other Ambulatory Visit: Payer: Self-pay

## 2015-03-19 ENCOUNTER — Ambulatory Visit: Payer: Self-pay | Admitting: *Deleted

## 2015-03-19 DIAGNOSIS — R109 Unspecified abdominal pain: Secondary | ICD-10-CM | POA: Diagnosis not present

## 2015-03-19 DIAGNOSIS — E440 Moderate protein-calorie malnutrition: Secondary | ICD-10-CM | POA: Diagnosis present

## 2015-03-19 DIAGNOSIS — Z96651 Presence of right artificial knee joint: Secondary | ICD-10-CM | POA: Diagnosis present

## 2015-03-19 DIAGNOSIS — R188 Other ascites: Secondary | ICD-10-CM | POA: Diagnosis present

## 2015-03-19 DIAGNOSIS — E1165 Type 2 diabetes mellitus with hyperglycemia: Secondary | ICD-10-CM | POA: Diagnosis not present

## 2015-03-19 DIAGNOSIS — D3A8 Other benign neuroendocrine tumors: Secondary | ICD-10-CM | POA: Diagnosis present

## 2015-03-19 DIAGNOSIS — R1110 Vomiting, unspecified: Secondary | ICD-10-CM | POA: Diagnosis not present

## 2015-03-19 DIAGNOSIS — Z8249 Family history of ischemic heart disease and other diseases of the circulatory system: Secondary | ICD-10-CM | POA: Diagnosis not present

## 2015-03-19 DIAGNOSIS — Z7982 Long term (current) use of aspirin: Secondary | ICD-10-CM | POA: Diagnosis not present

## 2015-03-19 DIAGNOSIS — J90 Pleural effusion, not elsewhere classified: Secondary | ICD-10-CM | POA: Diagnosis not present

## 2015-03-19 DIAGNOSIS — G8929 Other chronic pain: Secondary | ICD-10-CM | POA: Diagnosis present

## 2015-03-19 DIAGNOSIS — I10 Essential (primary) hypertension: Secondary | ICD-10-CM | POA: Diagnosis not present

## 2015-03-19 DIAGNOSIS — M545 Low back pain: Secondary | ICD-10-CM | POA: Diagnosis present

## 2015-03-19 DIAGNOSIS — D72829 Elevated white blood cell count, unspecified: Secondary | ICD-10-CM | POA: Diagnosis not present

## 2015-03-19 DIAGNOSIS — Y838 Other surgical procedures as the cause of abnormal reaction of the patient, or of later complication, without mention of misadventure at the time of the procedure: Secondary | ICD-10-CM | POA: Diagnosis present

## 2015-03-19 DIAGNOSIS — Z6830 Body mass index (BMI) 30.0-30.9, adult: Secondary | ICD-10-CM | POA: Diagnosis not present

## 2015-03-19 DIAGNOSIS — Z79899 Other long term (current) drug therapy: Secondary | ICD-10-CM | POA: Diagnosis not present

## 2015-03-19 DIAGNOSIS — I959 Hypotension, unspecified: Secondary | ICD-10-CM | POA: Diagnosis not present

## 2015-03-19 DIAGNOSIS — Z90411 Acquired partial absence of pancreas: Secondary | ICD-10-CM | POA: Diagnosis not present

## 2015-03-19 DIAGNOSIS — D7589 Other specified diseases of blood and blood-forming organs: Secondary | ICD-10-CM | POA: Diagnosis present

## 2015-03-19 DIAGNOSIS — R5381 Other malaise: Secondary | ICD-10-CM | POA: Diagnosis not present

## 2015-03-19 DIAGNOSIS — K219 Gastro-esophageal reflux disease without esophagitis: Secondary | ICD-10-CM | POA: Diagnosis present

## 2015-03-19 DIAGNOSIS — Z833 Family history of diabetes mellitus: Secondary | ICD-10-CM | POA: Diagnosis not present

## 2015-03-19 DIAGNOSIS — Z87891 Personal history of nicotine dependence: Secondary | ICD-10-CM | POA: Diagnosis not present

## 2015-03-19 DIAGNOSIS — H9191 Unspecified hearing loss, right ear: Secondary | ICD-10-CM | POA: Diagnosis present

## 2015-03-19 DIAGNOSIS — E739 Lactose intolerance, unspecified: Secondary | ICD-10-CM | POA: Diagnosis present

## 2015-03-19 DIAGNOSIS — G4733 Obstructive sleep apnea (adult) (pediatric): Secondary | ICD-10-CM | POA: Diagnosis present

## 2015-03-19 DIAGNOSIS — Z9081 Acquired absence of spleen: Secondary | ICD-10-CM | POA: Diagnosis not present

## 2015-03-19 DIAGNOSIS — Z87442 Personal history of urinary calculi: Secondary | ICD-10-CM | POA: Diagnosis not present

## 2015-03-19 DIAGNOSIS — K651 Peritoneal abscess: Secondary | ICD-10-CM | POA: Diagnosis present

## 2015-03-19 DIAGNOSIS — T814XXA Infection following a procedure, initial encounter: Secondary | ICD-10-CM | POA: Diagnosis present

## 2015-03-19 DIAGNOSIS — R4182 Altered mental status, unspecified: Secondary | ICD-10-CM | POA: Diagnosis not present

## 2015-03-19 DIAGNOSIS — R1012 Left upper quadrant pain: Secondary | ICD-10-CM | POA: Diagnosis present

## 2015-03-19 DIAGNOSIS — B958 Unspecified staphylococcus as the cause of diseases classified elsewhere: Secondary | ICD-10-CM | POA: Diagnosis present

## 2015-03-19 DIAGNOSIS — Z794 Long term (current) use of insulin: Secondary | ICD-10-CM | POA: Diagnosis not present

## 2015-03-19 LAB — GLUCOSE, CAPILLARY
Glucose-Capillary: 278 mg/dL — ABNORMAL HIGH (ref 65–99)
Glucose-Capillary: 282 mg/dL — ABNORMAL HIGH (ref 65–99)
Glucose-Capillary: 199 mg/dL — ABNORMAL HIGH (ref 65–99)
Glucose-Capillary: 205 mg/dL — ABNORMAL HIGH (ref 65–99)

## 2015-03-19 MED ORDER — NALOXONE HCL 0.4 MG/ML IJ SOLN
0.4000 mg | INTRAMUSCULAR | Status: DC | PRN
  Administered 2015-03-20: 0.4 mg via INTRAVENOUS
  Filled 2015-03-19: qty 1

## 2015-03-19 MED ORDER — ONDANSETRON HCL 4 MG/2ML IJ SOLN
4.0000 mg | Freq: Four times a day (QID) | INTRAMUSCULAR | Status: DC | PRN
  Administered 2015-03-19 – 2015-03-20 (×4): 4 mg via INTRAVENOUS
  Filled 2015-03-19: qty 2

## 2015-03-19 MED ORDER — IOHEXOL 300 MG/ML  SOLN
25.0000 mL | INTRAMUSCULAR | Status: AC
  Administered 2015-03-19 (×2): 25 mL via ORAL

## 2015-03-19 MED ORDER — IOHEXOL 300 MG/ML  SOLN
100.0000 mL | Freq: Once | INTRAMUSCULAR | Status: AC | PRN
  Administered 2015-03-19: 100 mL via INTRAVENOUS

## 2015-03-19 MED ORDER — HYDROMORPHONE 1 MG/ML IV SOLN
INTRAVENOUS | Status: DC
  Administered 2015-03-19: 0.9 mg via INTRAVENOUS
  Administered 2015-03-19: 1.5 mg via INTRAVENOUS
  Administered 2015-03-19: 1.8 mg via INTRAVENOUS
  Administered 2015-03-19: 09:00:00 via INTRAVENOUS
  Administered 2015-03-20: 1.2 mg via INTRAVENOUS
  Administered 2015-03-20: 9.9 mg via INTRAVENOUS
  Administered 2015-03-20: 5.6 mg via INTRAVENOUS
  Administered 2015-03-20: 1.2 mg via INTRAVENOUS
  Administered 2015-03-20: 1.8 mg via INTRAVENOUS
  Filled 2015-03-19: qty 25

## 2015-03-19 MED ORDER — ERTAPENEM SODIUM 1 G IJ SOLR
1.0000 g | INTRAMUSCULAR | Status: AC
Start: 2015-03-20 — End: 2015-04-03
  Administered 2015-03-20 – 2015-04-03 (×15): 1 g via INTRAVENOUS
  Filled 2015-03-19 (×16): qty 1

## 2015-03-19 MED ORDER — DIPHENHYDRAMINE HCL 50 MG/ML IJ SOLN: 12.5000 mg | Freq: Four times a day (QID) | INTRAMUSCULAR | Status: DC | PRN

## 2015-03-19 MED ORDER — SODIUM CHLORIDE 0.9 % IV SOLN
1.0000 g | INTRAVENOUS | Status: AC
  Administered 2015-03-19: 1 g via INTRAVENOUS
  Filled 2015-03-19: qty 1

## 2015-03-19 MED ORDER — INSULIN ASPART 100 UNIT/ML ~~LOC~~ SOLN
0.0000 [IU] | SUBCUTANEOUS | Status: DC
  Administered 2015-03-19: 3 [IU] via SUBCUTANEOUS
  Administered 2015-03-19: 5 [IU] via SUBCUTANEOUS
  Administered 2015-03-19 (×2): 8 [IU] via SUBCUTANEOUS
  Administered 2015-03-20 (×4): 3 [IU] via SUBCUTANEOUS
  Administered 2015-03-20: 5 [IU] via SUBCUTANEOUS
  Administered 2015-03-20 – 2015-03-21 (×2): 3 [IU] via SUBCUTANEOUS
  Administered 2015-03-21 (×2): 2 [IU] via SUBCUTANEOUS
  Administered 2015-03-21 (×2): 3 [IU] via SUBCUTANEOUS
  Administered 2015-03-21: 2 [IU] via SUBCUTANEOUS
  Administered 2015-03-21: 3 [IU] via SUBCUTANEOUS
  Administered 2015-03-22: 2 [IU] via SUBCUTANEOUS
  Administered 2015-03-22: 3 [IU] via SUBCUTANEOUS
  Administered 2015-03-22: 5 [IU] via SUBCUTANEOUS
  Administered 2015-03-22: 2 [IU] via SUBCUTANEOUS
  Administered 2015-03-22 (×2): 3 [IU] via SUBCUTANEOUS
  Administered 2015-03-23: 8 [IU] via SUBCUTANEOUS
  Administered 2015-03-23: 3 [IU] via SUBCUTANEOUS
  Administered 2015-03-23: 5 [IU] via SUBCUTANEOUS
  Administered 2015-03-23 – 2015-03-24 (×4): 3 [IU] via SUBCUTANEOUS
  Administered 2015-03-24 (×3): 5 [IU] via SUBCUTANEOUS
  Administered 2015-03-25 (×2): 3 [IU] via SUBCUTANEOUS
  Administered 2015-03-25: 2 [IU] via SUBCUTANEOUS
  Administered 2015-03-25 – 2015-03-27 (×12): 3 [IU] via SUBCUTANEOUS
  Administered 2015-03-27 (×2): 2 [IU] via SUBCUTANEOUS
  Administered 2015-03-27: 3 [IU] via SUBCUTANEOUS
  Administered 2015-03-28: 2 [IU] via SUBCUTANEOUS
  Administered 2015-03-28 (×2): 3 [IU] via SUBCUTANEOUS
  Administered 2015-03-28 – 2015-03-29 (×2): 2 [IU] via SUBCUTANEOUS
  Administered 2015-03-29 (×2): 3 [IU] via SUBCUTANEOUS
  Administered 2015-03-29: 2 [IU] via SUBCUTANEOUS
  Administered 2015-03-30: 3 [IU] via SUBCUTANEOUS
  Administered 2015-03-30: 2 [IU] via SUBCUTANEOUS
  Administered 2015-03-30 – 2015-03-31 (×3): 3 [IU] via SUBCUTANEOUS
  Administered 2015-03-31 – 2015-04-01 (×2): 2 [IU] via SUBCUTANEOUS
  Administered 2015-04-01: 3 [IU] via SUBCUTANEOUS
  Administered 2015-04-02 (×3): 2 [IU] via SUBCUTANEOUS

## 2015-03-19 MED ORDER — SODIUM CHLORIDE 0.9% FLUSH: 9.0000 mL | INTRAVENOUS | Status: DC | PRN

## 2015-03-19 MED ORDER — DIPHENHYDRAMINE HCL 12.5 MG/5ML PO ELIX: 12.5000 mg | ORAL_SOLUTION | Freq: Four times a day (QID) | ORAL | Status: DC | PRN

## 2015-03-19 NOTE — Progress Notes (Signed)
General Surgery [facility]  CT scan shows two large fluid collections in the pancreatic bed and LUQ.  Discussed with Dr. Shon Hale from radiology and reviewed personally.  I have consulted IR to evaluate for percutaneous drainage either tomorrow or Monday.  Given appearance of collections and elevated WBC of 13.9, will start empiric Invanz IV.  Discussed with patient at bedside.  [name], MD, [facility] Office: [phone]

## 2015-03-19 NOTE — Telephone Encounter (Signed)
B/s readings for medication Novalog  Wed 6:55 pm  323  9:37 pm  395 Thur 8:52 am  315  2:20 pm  305  Thu 5:00 In the hospital  Fri still in hospital, they think his Novalog should be changed  Please advise

## 2015-03-19 NOTE — Progress Notes (Signed)
Inpatient Diabetes Program Recommendations  AACE/ADA: New Consensus Statement on Inpatient Glycemic Control (2015)  Target Ranges:  Prepandial:   less than 140 mg/dL      Peak postprandial:   less than 180 mg/dL (1-2 hours)      Critically ill patients:  140 - 180 mg/dL   Review of Glycemic Control  Diabetes history: DM2 - now with partial pancreatectomy -  Outpatient Diabetes medications: Lantus 20 units QHS, Novolog 8 units tidwc Current orders for Inpatient glycemic control: Novolog moderate Q4H  Results for Derek [name], Derek SR. (MRN [id number]) as of 03/19/2015 13:13  Ref. Range 03/19/2015 08:56 03/19/2015 12:07  Glucose-Capillary Latest Ref Range: 65-99 mg/dL 278 (H) 35 (H)   70 year old male diagnosed with DM2 in 2007, on insulin since distal pancreatectomy 3 weeks ago; since then has lost approximately 25 pounds. NPO at present.  Needs part of Lantus.  Inpatient Diabetes Program Recommendations:    Add Lantus 16 units QHS (80% home dose) Will need to titrate if FBS > 180 mg/dL Add Novolog 4 units tidwc when pt taking po's.  F/U with endo when discharged. Spoke with pt and wife about insulin regimen at home. Answered questions regarding monitoring and diet.  Will continue to follow. Thank you. Lorenda Peck, RD, LDN, CDE Inpatient Diabetes Coordinator [phone]

## 2015-03-19 NOTE — H&P (Signed)
Derek Mohr Sr. is an 70 y.o. male.    General Surgery [facility]  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  Initially did well after discharge home.  Seen by endocrinologist for management of IDDM.  Developed progressive LUQ abd pain, nausea, emesis, and worsening back pain.  Presented to [name] yesterday and admitted for further work up and treatment.  Past Medical History  Diagnosis Date  . Hypertension   . Diabetes mellitus   . GERD (gastroesophageal reflux disease)   . Meniere's disease   . Morbid obesity ([place])   . Depression   . Arthritis   . 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 Anxiety     prone to "panic attacks"  . 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 History of kidney stones     multiple kidney stones-not a problem at present  . Kidney stone   . Dermatitis     bilateral hands  . BPH (benign prostatic hyperplasia)   . Shortness of breath dyspnea   . 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 DDD (degenerative disc disease), lumbar     gets back injections   . Neuroendocrine tumor of pancreas ([place])   . Straining on urination   . Obstructive sleep apnea     " i used to have sleep apnea" never used a c-pap   . Cancer [facility])     newly diagnosed pancreatic cancer    Past Surgical History  Procedure Laterality Date  . Knee arthroscopy Right 2002    rt knee  . Rotator cuff repair  '93bilateral '94 lt    x3  . Umbilical hernia repair  2000  . Knee arthroscopy  03/01/2012    Procedure: ARTHROSCOPY KNEE;  Surgeon: Gearlean Alf, MD;  Location: [facility];  Service: Orthopedics;  Laterality: Left;  WITH SYNOVECTOMY   . Ankle surgery Left 2010    left-fx- some retained hardware as of 05-22-13  . Hardware  removal Left 2013    ankle-lt  . Anal fistulectomy N/A 04/01/2013    Procedure: EXAM UNDER ANESTHESIA WITH ANAL FISSURectomy, sphincterotomy and internal hemorrhoidectomy;  Surgeon: Harl Bowie, MD;  Location: [place];  Service: General;  Laterality: N/A;  . Replacement total knee Left 2009    knee-left  . Total knee arthroplasty Right 06/02/2013    Procedure: RIGHT TOTAL KNEE ARTHROPLASTY;  Surgeon: Gearlean Alf, MD;  Location: [facility];  Service: Orthopedics;  Laterality: Right;  . Colonoscopy    . Upper gastrointestinal endoscopy  sept 2016  . Inguinal hernia repair  '74,'82    x 2, inguinal  . Eus N/A 12/31/2014    Procedure: UPPER ENDOSCOPIC ULTRASOUND (EUS) LINEAR;  Surgeon: Milus Banister, MD;  Location: [facility];  Service: Endoscopy;  Laterality: N/A;    Family History  Problem Relation Age of Onset  . Factor V Leiden deficiency Daughter   . Diabetes Maternal Grandmother   . Heart disease Paternal Uncle   . Bone cancer Father     Jaw  . Colon cancer Neg Hx   . Esophageal cancer Neg Hx   . Stomach cancer Neg Hx   . Rectal cancer Neg Hx  Social History:  reports that he quit smoking about 40 years ago. His smoking use included Cigarettes. He has a 68 pack-year smoking history. He has never used smokeless tobacco. He reports that he does not drink alcohol or use illicit drugs.  Allergies:  Allergies  Allergen Reactions  . Morphine And Related Nausea And Vomiting  . Oxycontin [Oxycodone Hcl] Swelling    Lips swells  . Quinine Other (See Comments)    Platelets dropped  . Voltaren [Diclofenac Sodium] Other (See Comments)    Elevated liver enzymes    Medications Prior to Admission  Medication Sig Dispense Refill  . carvedilol (COREG) 25 MG tablet Take 25 mg by mouth 2 (two) times daily with a meal.     . [employer] TRIPLE MAGNESIUM COMPLEX PO Take 1-2 capsules by mouth 2 (two) times daily. Two capsules in the morning and one at night    .  diphenhydrAMINE (BENADRYL) 25 mg capsule Take 50 mg by mouth 2 (two) times daily. Reported on 03/17/2015    . HYDROmorphone (DILAUDID) 2 MG tablet Take 1 tablet (2 mg total) by mouth every 3 (three) hours as needed for moderate pain or severe pain. 30 tablet 0  . insulin aspart (NOVOLOG) 100 UNIT/ML injection Inject 0-9 Units into the skin 3 (three) times daily before meals. 10 mL 11  . insulin glargine (LANTUS) 100 UNIT/ML injection Inject 0.2 mLs (20 Units total) into the skin daily. 10 mL 11  . losartan (COZAAR) 50 MG tablet Take 50 mg by mouth daily.  4  . naproxen sodium (ALEVE) 220 MG tablet Take 440 mg by mouth 2 (two) times daily with a meal.     . aspirin EC 81 MG tablet Take 81 mg by mouth at bedtime.    . clobetasol ointment (TEMOVATE) 0.56 % Apply 1 application topically daily as needed (for peeling skin on hands and feet). Reported on 03/17/2015    . Insulin Syringes, Disposable, U-100 0.5 ML MISC 20 Units by Does not apply route daily. 100 each 1  . lansoprazole (PREVACID) 15 MG capsule Take 30 mg by mouth daily as needed (reflux). Reported on 03/17/2015    . ONETOUCH DELICA LANCETS 97X MISC 1 Device by Does not apply route 4 (four) times daily. 120 each 11    Results for orders placed or performed during the hospital encounter of 03/18/15 (from the past 48 hour(s))  CBC WITH DIFFERENTIAL     Status: Abnormal   Collection Time: 03/18/15  6:59 PM  Result Value Ref Range   WBC 13.9 (H) 4.0 - 10.5 K/uL   RBC 4.40 4.22 - 5.81 MIL/uL   Hemoglobin 10.4 (L) 13.0 - 17.0 g/dL   HCT 33.0 (L) 39.0 - 52.0 %   MCV 75.0 (L) 78.0 - 100.0 fL   MCH 23.6 (L) 26.0 - 34.0 pg   MCHC 31.5 30.0 - 36.0 g/dL   RDW 14.6 11.5 - 15.5 %   Platelets 956 (HH) 150 - 400 K/uL    Comment: REPEATED TO VERIFY SPECIMEN CHECKED FOR CLOTS PLATELET COUNT CONFIRMED BY SMEAR GIANT PLATELETS SEEN CRITICAL RESULT CALLED TO, READ BACK BY AND VERIFIED WITH: Donia Pounds, RN 1.26.17 @ 2030 BY RICEJ    Neutrophils  Relative % 66 %   Lymphocytes Relative 19 %   Monocytes Relative 13 %   Eosinophils Relative 1 %   Basophils Relative 1 %   Neutro Abs 9.3 (H) 1.7 - 7.7 K/uL   Lymphs Abs 2.6 0.7 -  4.0 K/uL   Monocytes Absolute 1.8 (H) 0.1 - 1.0 K/uL   Eosinophils Absolute 0.1 0.0 - 0.7 K/uL   Basophils Absolute 0.1 0.0 - 0.1 K/uL   WBC Morphology TOXIC GRANULATION     Comment: DOHLE BODIES  Comprehensive metabolic panel     Status: Abnormal   Collection Time: 03/18/15  6:59 PM  Result Value Ref Range   Sodium 132 (L) 135 - 145 mmol/L   Potassium 4.7 3.5 - 5.1 mmol/L   Chloride 99 (L) 101 - 111 mmol/L   CO2 25 22 - 32 mmol/L   Glucose, Bld 243 (H) 65 - 99 mg/dL   BUN 17 6 - 20 mg/dL   Creatinine, Ser 0.85 0.61 - 1.24 mg/dL   Calcium 10.4 (H) 8.9 - 10.3 mg/dL   Total Protein 7.1 6.5 - 8.1 g/dL   Albumin 2.7 (L) 3.5 - 5.0 g/dL   AST 34 15 - 41 U/L   ALT 31 17 - 63 U/L   Alkaline Phosphatase 147 (H) 38 - 126 U/L   Total Bilirubin 0.7 0.3 - 1.2 mg/dL   GFR calc non Af Amer >60 >60 mL/min   GFR calc Af Amer >60 >60 mL/min    Comment: (NOTE) The eGFR has been calculated using the CKD EPI equation. This calculation has not been validated in all clinical situations. eGFR's persistently <60 mL/min signify possible Chronic Kidney Disease.    Anion gap 8 5 - 15  Protime-INR     Status: Abnormal   Collection Time: 03/18/15  6:59 PM  Result Value Ref Range   Prothrombin Time 16.1 (H) 11.6 - 15.2 seconds   INR 1.33 0.00 - 1.49  Urinalysis, Routine w reflex microscopic (not at [facility])     Status: Abnormal   Collection Time: 03/18/15 11:02 PM  Result Value Ref Range   Color, Urine YELLOW YELLOW   APPearance CLOUDY (A) CLEAR   Specific Gravity, Urine 1.026 1.005 - 1.030   pH 5.5 5.0 - 8.0   Glucose, UA >1000 (A) NEGATIVE mg/dL   Hgb urine dipstick NEGATIVE NEGATIVE   Bilirubin Urine NEGATIVE NEGATIVE   Ketones, ur NEGATIVE NEGATIVE mg/dL   Protein, ur NEGATIVE NEGATIVE mg/dL   Nitrite NEGATIVE  NEGATIVE   Leukocytes, UA NEGATIVE NEGATIVE  Urine microscopic-add on     Status: Abnormal   Collection Time: 03/18/15 11:02 PM  Result Value Ref Range   Squamous Epithelial / LPF 0-5 (A) NONE SEEN   WBC, UA 6-30 0 - 5 WBC/hpf   RBC / HPF NONE SEEN 0 - 5 RBC/hpf   Bacteria, UA MANY (A) NONE SEEN   Dg Chest 2 View  03/18/2015  CLINICAL DATA:  Productive cough for 2 days EXAM: CHEST  2 VIEW COMPARISON:  Radiograph 02/25/2015 FINDINGS: The cardiac silhouette is normal. The ascending aorta is ectatic. No effusion, infiltrate or pneumothorax. IMPRESSION: 1. No acute cardiopulmonary findings. 2. Ectatic ascending aorta. Electronically Signed   By: Suzy Bouchard M.D.   On: 03/18/2015 18:32    Review of Systems  HENT: Positive for sore throat.   Eyes: Negative.   Respiratory: Positive for shortness of breath.   Cardiovascular: Negative.   Gastrointestinal: Positive for nausea, vomiting and abdominal pain (left upper quadrant).  Genitourinary: Negative.   Musculoskeletal: Positive for back pain.  Skin: Negative.   Neurological: Positive for weakness.  Endo/Heme/Allergies: Negative.   Psychiatric/Behavioral: Negative.     Blood pressure 136/72, pulse 74, temperature 98.8 F (37.1 C), temperature source  Oral, resp. rate 18, SpO2 96 %. Physical Exam  Constitutional: He is oriented to person, place, and time. He appears well-developed and well-nourished. He appears distressed.  HENT:  Head: Normocephalic and atraumatic.  Right Ear: External ear normal.  Left Ear: External ear normal.  Eyes: Conjunctivae are normal. Pupils are equal, round, and reactive to light. No scleral icterus.  Neck: Normal range of motion. Neck supple. No tracheal deviation present. No thyromegaly present.  Cardiovascular: Normal rate, regular rhythm and normal heart sounds.   Respiratory: Effort normal and breath sounds normal. No respiratory distress. He has no wheezes.  GI: Soft. Bowel sounds are normal. He  exhibits no distension and no mass. There is tenderness (left upper quadrant). There is guarding. There is no rebound.  Bilateral subcostal incision healing well without sign of infection or herniation  Musculoskeletal: Normal range of motion. He exhibits no edema or tenderness.  Neurological: He is alert and oriented to person, place, and time.  Skin: Skin is warm and dry. He is not diaphoretic.  Psychiatric: He has a normal mood and affect. His behavior is normal.     Assessment/Plan Status post distal pancreatectomy and splenectomy for neuroendocrine tumor of pancreas  Onset nausea and emesis past week  LUQ pain - onset past week  Will get CT scan abd / pelvis today to evaluate for post op complication  PCA for pain control  NPO, IVF Diabetes  Check CBG's, SSI  Consult to diabetes coordinator Low back pain  Pain Rx - chronic  [name], MD, [facility] [name]: [place] 03/19/2015, 8:04 AM

## 2015-03-19 NOTE — Telephone Encounter (Signed)
Please tell your dr you are worried it is high, and go along with what he/she says

## 2015-03-19 NOTE — Telephone Encounter (Signed)
See note below and please advise, Thanks! 

## 2015-03-19 NOTE — Consult Note (Signed)
   [facility] CM Inpatient Consult   03/19/2015  Derek [name] Sr. November 16, 1945 [id number]   Received notification from [place] of patient's admission. Called into patient's room and spoke with wife. Mrs. Ostling reports patient is resting currently. Explained [facility] Care Management program and Mrs. Sohal gives verbal consent. Confirmed best contact number is her cell number as [phone]. Will refer to [name] for follow up. Will make inpatient RNCM aware.   Marthenia Rolling, MSN-Ed, RN,BSN [facility] Liaison [phone]

## 2015-03-19 NOTE — Patient Outreach (Signed)
[facility]) Care Management  03/19/2015  Derek [name] Sr. Jan 17, 1946 [id number]   Epic chart review: Patient admitted to the hospital on 03/18/15.  Referred patient to [facility] for follow up.    PLAN:  In basket message sent to [place] to refer patient to hospital liaison to contact and offer services for transition of care follow up.   Quinn Plowman RN,BSN,CCM [name] Telephonic  [phone]

## 2015-03-19 NOTE — Telephone Encounter (Signed)
Pt's wife advised of note below and verbalized understanding.

## 2015-03-19 NOTE — Consult Note (Signed)
Chief Complaint: intra-abdominal abscesses s/p distal pancreatectomy and splenectomy Referring Physician: Dr. Armandina Gemma HPI: Derek Mohr Sr. is an 70 y.o. male who was recently diagnosed with pancreatic tail cancer and underwent a laparotomy with distal pancreatectomy and splenectomy by Dr. Harlow Asa 3 weeks ago.  He returned to the office yesterday complaining of fatigue and some left sided abdominal pain.  He was admitted and a CT scan was performed today that revealed 2 large intra-abdominal fluid collections.  We have been asked to evaluate him for percutaneous drain/s placement.  Past Medical History:  Past Medical History  Diagnosis Date  . Hypertension   . Diabetes mellitus   . GERD (gastroesophageal reflux disease)   . Meniere's disease   . Morbid obesity ([place])   . Depression   . Arthritis   . 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 Anxiety     prone to "panic attacks"  . 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 History of kidney stones     multiple kidney stones-not a problem at present  . Kidney stone   . Dermatitis     bilateral hands  . BPH (benign prostatic hyperplasia)   . Shortness of breath dyspnea   . 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 DDD (degenerative disc disease), lumbar     gets back injections   . Neuroendocrine tumor of pancreas ([place])   . Straining on urination   . Obstructive sleep apnea     " i used to have sleep apnea" never used a c-pap   . Cancer [facility])     newly diagnosed pancreatic cancer    Past Surgical History:  Past Surgical History  Procedure Laterality Date  . Knee arthroscopy Right 2002    rt knee  . Rotator cuff repair  '93bilateral '94 lt    x3  . Umbilical hernia repair  2000  . Knee arthroscopy  03/01/2012    Procedure: ARTHROSCOPY KNEE;  Surgeon: Gearlean Alf, MD;  Location: [facility];  Service: Orthopedics;  Laterality: Left;  WITH  SYNOVECTOMY   . Ankle surgery Left 2010    left-fx- some retained hardware as of 05-22-13  . Hardware removal Left 2013    ankle-lt  . Anal fistulectomy N/A 04/01/2013    Procedure: EXAM UNDER ANESTHESIA WITH ANAL FISSURectomy, sphincterotomy and internal hemorrhoidectomy;  Surgeon: Harl Bowie, MD;  Location: [facility];  Service: General;  Laterality: N/A;  . Replacement total knee Left 2009    knee-left  . Total knee arthroplasty Right 06/02/2013    Procedure: RIGHT TOTAL KNEE ARTHROPLASTY;  Surgeon: Gearlean Alf, MD;  Location: [facility];  Service: Orthopedics;  Laterality: Right;  . Colonoscopy    . Upper gastrointestinal endoscopy  sept 2016  . Inguinal hernia repair  '74,'82    x 2, inguinal  . Eus N/A 12/31/2014    Procedure: UPPER ENDOSCOPIC ULTRASOUND (EUS) LINEAR;  Surgeon: Milus Banister, MD;  Location: [facility];  Service: Endoscopy;  Laterality: N/A;    Family History:  Family History  Problem Relation Age of Onset  . Factor V Leiden deficiency Daughter   . Diabetes Maternal Grandmother   . Heart disease Paternal Uncle   . Bone cancer Father     Jaw  . Colon cancer Neg Hx   . Esophageal cancer Neg Hx   .  Stomach cancer Neg Hx   . Rectal cancer Neg Hx     Social History:  reports that he quit smoking about 40 years ago. His smoking use included Cigarettes. He has a 68 pack-year smoking history. He has never used smokeless tobacco. He reports that he does not drink alcohol or use illicit drugs.  Allergies:  Allergies  Allergen Reactions  . Morphine And Related Nausea And Vomiting  . Oxycontin [Oxycodone Hcl] Swelling    Lips swells  . Quinine Other (See Comments)    Platelets dropped  . Voltaren [Diclofenac Sodium] Other (See Comments)    Elevated liver enzymes    Medications:   Medication List    ASK your doctor about these medications        ALEVE 220 MG tablet  Generic drug:  naproxen sodium  Take 440 mg by mouth 2 (two)  times daily with a meal.     aspirin EC 81 MG tablet  Take 81 mg by mouth at bedtime.     carvedilol 25 MG tablet  Commonly known as:  COREG  Take 25 mg by mouth 2 (two) times daily with a meal.     clobetasol ointment 0.05 %  Commonly known as:  TEMOVATE  Apply 1 application topically daily as needed (for peeling skin on hands and feet). Reported on 03/17/2015     [employer] TRIPLE MAGNESIUM COMPLEX PO  Take 1-2 capsules by mouth 2 (two) times daily. Two capsules in the morning and one at night     diphenhydrAMINE 25 mg capsule  Commonly known as:  BENADRYL  Take 50 mg by mouth 2 (two) times daily. Reported on 03/17/2015     HYDROmorphone 2 MG tablet  Commonly known as:  DILAUDID  Take 1 tablet (2 mg total) by mouth every 3 (three) hours as needed for moderate pain or severe pain.     insulin aspart 100 UNIT/ML injection  Commonly known as:  NOVOLOG  Inject 0-9 Units into the skin 3 (three) times daily before meals.     insulin glargine 100 UNIT/ML injection  Commonly known as:  LANTUS  Inject 0.2 mLs (20 Units total) into the skin daily.     Insulin Syringes (Disposable) U-100 0.5 ML Misc  20 Units by Does not apply route daily.     lansoprazole 15 MG capsule  Commonly known as:  PREVACID  Take 30 mg by mouth daily as needed (reflux). Reported on 03/17/2015     losartan 50 MG tablet  Commonly known as:  COZAAR  Take 50 mg by mouth daily.     ONETOUCH DELICA LANCETS 42H Misc  1 Device by Does not apply route 4 (four) times daily.        Please HPI for pertinent positives, otherwise complete 10 system ROS negative, with the exception of chronic SOB due to Welder's lung and weakness.  Mallampati Score: MD Evaluation Airway: WNL Heart: WNL Abdomen: WNL Chest/ Lungs: WNL ASA  Classification: 3 Mallampati/Airway Score: Two  Physical Exam: BP 120/73 mmHg  Pulse 70  Temp(Src) 98.2 F (36.8 C) (Oral)  Resp 16  Ht 6' (1.829 m)  Wt 245 lb (111.131 kg)  BMI 33.22 kg/m2   SpO2 98% Body mass index is 33.22 kg/(m^2).  General: pleasant, obese white male who is laying in bed in NAD HEENT: head is normocephalic, atraumatic.  Sclera are noninjected.  PERRL.  Ears and nose without any masses or lesions.  Mouth is pink and moist Heart:  regular, rate, and rhythm.  Normal s1,s2. No obvious murmurs, gallops, or rubs noted.  Palpable radial and pedal pulses bilaterally Lungs: CTAB, no wheezes, rhonchi, or rales noted.  Respiratory effort nonlabored Abd: soft, tender appropriately over his incision which is healing well.  Tender otherwise along the left side of his abdomen. ND, +BS, no masses, hernias, or organomegaly Psych: A&Ox3 with an appropriate affect.  Labs: Results for orders placed or performed during the hospital encounter of 03/18/15 (from the past 48 hour(s))  CBC WITH DIFFERENTIAL     Status: Abnormal   Collection Time: 03/18/15  6:59 PM  Result Value Ref Range   WBC 13.9 (H) 4.0 - 10.5 K/uL   RBC 4.40 4.22 - 5.81 MIL/uL   Hemoglobin 10.4 (L) 13.0 - 17.0 g/dL   HCT 33.0 (L) 39.0 - 52.0 %   MCV 75.0 (L) 78.0 - 100.0 fL   MCH 23.6 (L) 26.0 - 34.0 pg   MCHC 31.5 30.0 - 36.0 g/dL   RDW 14.6 11.5 - 15.5 %   Platelets 956 (HH) 150 - 400 K/uL    Comment: REPEATED TO VERIFY SPECIMEN CHECKED FOR CLOTS PLATELET COUNT CONFIRMED BY SMEAR GIANT PLATELETS SEEN CRITICAL RESULT CALLED TO, READ BACK BY AND VERIFIED WITH: Donia Pounds, RN 1.26.17 @ 2030 BY RICEJ    Neutrophils Relative % 66 %   Lymphocytes Relative 19 %   Monocytes Relative 13 %   Eosinophils Relative 1 %   Basophils Relative 1 %   Neutro Abs 9.3 (H) 1.7 - 7.7 K/uL   Lymphs Abs 2.6 0.7 - 4.0 K/uL   Monocytes Absolute 1.8 (H) 0.1 - 1.0 K/uL   Eosinophils Absolute 0.1 0.0 - 0.7 K/uL   Basophils Absolute 0.1 0.0 - 0.1 K/uL   WBC Morphology TOXIC GRANULATION     Comment: DOHLE BODIES  Comprehensive metabolic panel     Status: Abnormal   Collection Time: 03/18/15  6:59 PM  Result Value Ref  Range   Sodium 132 (L) 135 - 145 mmol/L   Potassium 4.7 3.5 - 5.1 mmol/L   Chloride 99 (L) 101 - 111 mmol/L   CO2 25 22 - 32 mmol/L   Glucose, Bld 243 (H) 65 - 99 mg/dL   BUN 17 6 - 20 mg/dL   Creatinine, Ser 0.85 0.61 - 1.24 mg/dL   Calcium 10.4 (H) 8.9 - 10.3 mg/dL   Total Protein 7.1 6.5 - 8.1 g/dL   Albumin 2.7 (L) 3.5 - 5.0 g/dL   AST 34 15 - 41 U/L   ALT 31 17 - 63 U/L   Alkaline Phosphatase 147 (H) 38 - 126 U/L   Total Bilirubin 0.7 0.3 - 1.2 mg/dL   GFR calc non Af Amer >60 >60 mL/min   GFR calc Af Amer >60 >60 mL/min    Comment: (NOTE) The eGFR has been calculated using the CKD EPI equation. This calculation has not been validated in all clinical situations. eGFR's persistently <60 mL/min signify possible Chronic Kidney Disease.    Anion gap 8 5 - 15  Protime-INR     Status: Abnormal   Collection Time: 03/18/15  6:59 PM  Result Value Ref Range   Prothrombin Time 16.1 (H) 11.6 - 15.2 seconds   INR 1.33 0.00 - 1.49  Urinalysis, Routine w reflex microscopic (not at [facility])     Status: Abnormal   Collection Time: 03/18/15 11:02 PM  Result Value Ref Range   Color, Urine YELLOW YELLOW   APPearance CLOUDY (  A) CLEAR   Specific Gravity, Urine 1.026 1.005 - 1.030   pH 5.5 5.0 - 8.0   Glucose, UA >1000 (A) NEGATIVE mg/dL   Hgb urine dipstick NEGATIVE NEGATIVE   Bilirubin Urine NEGATIVE NEGATIVE   Ketones, ur NEGATIVE NEGATIVE mg/dL   Protein, ur NEGATIVE NEGATIVE mg/dL   Nitrite NEGATIVE NEGATIVE   Leukocytes, UA NEGATIVE NEGATIVE  Urine microscopic-add on     Status: Abnormal   Collection Time: 03/18/15 11:02 PM  Result Value Ref Range   Squamous Epithelial / LPF 0-5 (A) NONE SEEN   WBC, UA 6-30 0 - 5 WBC/hpf   RBC / HPF NONE SEEN 0 - 5 RBC/hpf   Bacteria, UA MANY (A) NONE SEEN  Glucose, capillary     Status: Abnormal   Collection Time: 03/19/15  8:56 AM  Result Value Ref Range   Glucose-Capillary 278 (H) 65 - 99 mg/dL  Glucose, capillary     Status: Abnormal    Collection Time: 03/19/15 12:07 PM  Result Value Ref Range   Glucose-Capillary 282 (H) 65 - 99 mg/dL  Glucose, capillary     Status: Abnormal   Collection Time: 03/19/15  3:58 PM  Result Value Ref Range   Glucose-Capillary 199 (H) 65 - 99 mg/dL    Imaging: Dg Chest 2 View  03/18/2015  CLINICAL DATA:  Productive cough for 2 days EXAM: CHEST  2 VIEW COMPARISON:  Radiograph 02/25/2015 FINDINGS: The cardiac silhouette is normal. The ascending aorta is ectatic. No effusion, infiltrate or pneumothorax. IMPRESSION: 1. No acute cardiopulmonary findings. 2. Ectatic ascending aorta. Electronically Signed   By: Suzy Bouchard M.D.   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 EXAM: CT ABDOMEN AND PELVIS WITH CONTRAST TECHNIQUE: Multidetector CT imaging of the abdomen and pelvis was performed using the standard protocol following bolus administration of intravenous contrast. CONTRAST:  131m OMNIPAQUE IOHEXOL 300 MG/ML  SOLN COMPARISON:  12/18/2014 FINDINGS: Lower chest: Atelectasis identified at both lung bases. Heart size is normal. No imaged pericardial effusion or significant coronary artery calcifications. Upper abdomen: Interval resection of the distal pancreas and the spleen. The pancreatic head and neck have a normal appearance and enhance normally. There is a fluid collection in the surgical bed, measuring 15.7 x 6.1 cm. A fat fluid level identified within this collection. Small locules of gas are identified within this collection. There is a thin rim of enhancement surrounding this collection. Within the left upper quadrant, there is a collection of fluid within the mesentery, also containing a fat fluid level. Locules of gas are also identified within this collection which measures 4.1 x 13.8 x 20.7 cm. No definite rim enhancement associated  with this collection. No focal abnormality identified within the liver. The spleen is surgically absent. Gallbladder is present. Small left renal cyst is present. No hydronephrosis. Adrenal glands are normal in appearance. Gastrointestinal tract: Moderate hiatal hernia. Stomach is displaced by fluid collections in the left upper quadrant. No evidence for bowel obstruction. Pelvis: Urinary bladder is distended. 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 Retroperitoneum: Atherosclerotic calcification of the abdominal aorta. No significant adenopathy. Abdominal wall: Postoperative changes are identified in the anterior abdominal wall. Fat containing left inguinal hernia. Osseous structures: Unremarkable. IMPRESSION: 1. Interval resection of the pancreatic body and tail ; splenectomy. 2. New  fluid collections within the left upper quadrant, likely representing early pseudo cysts given the presence of fat fluid levels. The presence of gas within these collections may indicate recent surgery or developing abscess. 3. Hiatal hernia. 4. Bladder diverticulum. 5. Atherosclerotic calcification of the abdominal aorta. The salient findings were discussed with TODD GERKIN on 03/19/2015 at 3:27 pm. Electronically Signed   By: Nolon Nations M.D.   On: 03/19/2015 15:34    Assessment/Plan 1. S/p laparotomy with distal pancreatectomy and splenectomy for pancreatic CA, now with 2 large intra-abdominal fluid collections -will plan for perc drain tomorrow with Dr. Annamaria Boots, who I have discussed this case with. -NPO p MN -INR done 1/26 is 1.33 -Risks and Benefits discussed with the patient including bleeding, infection, damage to adjacent structures, bowel perforation/fistula connection, and sepsis. All of the patient's questions were answered, patient is agreeable to proceed. Consent signed and in chart.     Thank you for this interesting consult.  I greatly enjoyed meeting Derek ANDUJO Sr. and look forward to participating in their care.  A copy of this report was sent to the requesting provider on this date.  Electronically Signed: Henreitta Cea 03/19/2015, 4:27 PM   I spent a total of 20 Minutes  in face to face in clinical consultation, greater than 50% of which was counseling/coordinating care for intra-abdominal abscesses.

## 2015-03-20 ENCOUNTER — Encounter (HOSPITAL_COMMUNITY): Payer: Self-pay | Admitting: Radiology

## 2015-03-20 ENCOUNTER — Inpatient Hospital Stay (HOSPITAL_COMMUNITY): Payer: PPO

## 2015-03-20 LAB — GLUCOSE, CAPILLARY
Glucose-Capillary: 161 mg/dL — ABNORMAL HIGH (ref 65–99)
Glucose-Capillary: 171 mg/dL — ABNORMAL HIGH (ref 65–99)
Glucose-Capillary: 176 mg/dL — ABNORMAL HIGH (ref 65–99)
Glucose-Capillary: 187 mg/dL — ABNORMAL HIGH (ref 65–99)
Glucose-Capillary: 202 mg/dL — ABNORMAL HIGH (ref 65–99)

## 2015-03-20 LAB — LACTIC ACID, PLASMA: Lactic Acid, Venous: 0.9 mmol/L (ref 0.5–2.0)

## 2015-03-20 LAB — MRSA PCR SCREENING: MRSA by PCR: POSITIVE — AB

## 2015-03-20 LAB — LACTATE DEHYDROGENASE: LDH: 112 U/L (ref 98–192)

## 2015-03-20 MED ORDER — MIDAZOLAM HCL 2 MG/2ML IJ SOLN
INTRAMUSCULAR | Status: AC
  Filled 2015-03-20: qty 6

## 2015-03-20 MED ORDER — HYDROMORPHONE BOLUS VIA INFUSION
1.0000 mg | INTRAVENOUS | Status: DC | PRN
  Administered 2015-03-20: 2 mg via INTRAVENOUS
  Filled 2015-03-20 (×2): qty 2

## 2015-03-20 MED ORDER — HYDROMORPHONE BOLUS VIA INFUSION: 1.0000 mg | INTRAVENOUS | Status: DC | PRN

## 2015-03-20 MED ORDER — HYDROMORPHONE HCL 1 MG/ML IJ SOLN
1.0000 mg | INTRAMUSCULAR | Status: DC | PRN
  Administered 2015-03-21: 2 mg via INTRAVENOUS
  Administered 2015-03-21 – 2015-03-22 (×5): 1 mg via INTRAVENOUS
  Filled 2015-03-20 (×4): qty 1
  Filled 2015-03-20 (×2): qty 2
  Filled 2015-03-20: qty 1

## 2015-03-20 MED ORDER — FENTANYL CITRATE (PF) 100 MCG/2ML IJ SOLN
INTRAMUSCULAR | Status: AC | PRN
  Administered 2015-03-20 (×4): 25 ug via INTRAVENOUS

## 2015-03-20 MED ORDER — HYDROMORPHONE BOLUS VIA INFUSION
2.0000 mg | Freq: Once | INTRAVENOUS | Status: DC
  Filled 2015-03-20: qty 2

## 2015-03-20 MED ORDER — HYDROMORPHONE HCL 2 MG/ML IJ SOLN: 2.0000 mg | Freq: Once | INTRAMUSCULAR | Status: AC

## 2015-03-20 MED ORDER — HYDROMORPHONE BOLUS VIA INFUSION
2.0000 mg | Freq: Once | INTRAVENOUS | Status: DC
  Administered 2015-03-20: 2 mg via INTRAVENOUS

## 2015-03-20 MED ORDER — ONDANSETRON HCL 4 MG/2ML IJ SOLN
INTRAMUSCULAR | Status: AC
  Filled 2015-03-20: qty 2

## 2015-03-20 MED ORDER — HYDROMORPHONE HCL 1 MG/ML IJ SOLN: 1.0000 mg | INTRAMUSCULAR | Status: DC | PRN

## 2015-03-20 MED ORDER — MIDAZOLAM HCL 2 MG/2ML IJ SOLN
INTRAMUSCULAR | Status: AC | PRN
  Administered 2015-03-20: 0.5 mg via INTRAVENOUS
  Administered 2015-03-20: 1 mg via INTRAVENOUS
  Administered 2015-03-20: 0.5 mg via INTRAVENOUS

## 2015-03-20 MED ORDER — MUPIROCIN 2 % EX OINT
1.0000 "application " | TOPICAL_OINTMENT | Freq: Two times a day (BID) | CUTANEOUS | Status: AC
  Administered 2015-03-20 – 2015-03-25 (×10): 1 via NASAL
  Filled 2015-03-20 (×3): qty 22

## 2015-03-20 MED ORDER — VANCOMYCIN HCL 10 G IV SOLR
2500.0000 mg | INTRAVENOUS | Status: AC
  Administered 2015-03-20: 2500 mg via INTRAVENOUS
  Filled 2015-03-20: qty 2000

## 2015-03-20 MED ORDER — CHLORHEXIDINE GLUCONATE CLOTH 2 % EX PADS
6.0000 | MEDICATED_PAD | Freq: Every day | CUTANEOUS | Status: DC
  Administered 2015-03-22: 6 via TOPICAL

## 2015-03-20 MED ORDER — VANCOMYCIN HCL IN DEXTROSE 750-5 MG/150ML-% IV SOLN
750.0000 mg | Freq: Two times a day (BID) | INTRAVENOUS | Status: DC
  Administered 2015-03-21 – 2015-03-22 (×3): 750 mg via INTRAVENOUS
  Filled 2015-03-20 (×5): qty 150

## 2015-03-20 MED ORDER — POTASSIUM CHLORIDE IN NACL 20-0.9 MEQ/L-% IV SOLN
INTRAVENOUS | Status: DC
  Administered 2015-03-20: 1000 mL via INTRAVENOUS
  Administered 2015-03-21: 01:00:00 via INTRAVENOUS
  Filled 2015-03-20 (×2): qty 1000

## 2015-03-20 MED ORDER — FENTANYL CITRATE (PF) 100 MCG/2ML IJ SOLN
INTRAMUSCULAR | Status: AC
  Filled 2015-03-20: qty 4

## 2015-03-20 NOTE — Progress Notes (Signed)
Nurse called to room by family. Family concerned pt not responding to them. Opened eyes when name called out and w/tacile stimuli. Oriented to person. Tachypnea noted with respirations 26 and tachycardia HR 120's. BP stable. Pt sitting upright.wife stated pt "has sleep apnea and he gets choked every time we try to lay [facility] back". Temp 101.3. Family concerned pt not voided today. NT went to obtain bladder scanner.

## 2015-03-20 NOTE — Progress Notes (Signed)
Patient ID: Derek Mohr Sr., male   DOB: 04-30-1945, 70 y.o.   MRN: [id number]    Subjective: Doesn't feel well. Nauseated with some vomiting.Intermittent left-sided abdominal pain.  Objective: Vital signs in last 24 hours: Temp:  [98.2 F (36.8 C)-98.9 F (37.2 C)] 98.7 F (37.1 C) (01/28 0535) Pulse Rate:  [70-94] 82 (01/28 0535) Resp:  [14-20] 16 (01/28 0535) BP: (120-140)/(66-73) 140/69 mmHg (01/28 0535) SpO2:  [94 %-100 %] 94 % (01/28 0535) Weight:  [111.131 kg (245 lb)] 111.131 kg (245 lb) (01/27 1100) Last BM Date: 03/17/15  Intake/Output from previous day: 01/27 0701 - 01/28 0700 In: 3050 [I.V.:3000; IV Piggyback:50] Out: [id number] [Urine:1025] Intake/Output this shift:    General appearance: cooperative, mild distress and a little drowsy Resp: clear to auscultation bilaterally GI: moderately tender and? Fullness left abdomen Incision/Wound: clean and dry  Lab Results:   Recent Labs  03/18/15 1859  WBC 13.9*  HGB 10.4*  HCT 33.0*  PLT 956*   BMET  Recent Labs  03/18/15 1859  NA 132*  K 4.7  CL 99*  CO2 25  GLUCOSE 243*  BUN 17  CREATININE 0.85  CALCIUM 10.4*   CBG (last 3)   Recent Labs  03/19/15 2005 03/20/15 0003 03/20/15 0355  GLUCAP 205* 171* 161*      Studies/Results: Dg Chest 2 View  03/18/2015  CLINICAL DATA:  Productive cough for 2 days EXAM: CHEST  2 VIEW COMPARISON:  Radiograph 02/25/2015 FINDINGS: The cardiac silhouette is normal. The ascending aorta is ectatic. No effusion, infiltrate or pneumothorax. IMPRESSION: 1. No acute cardiopulmonary findings. 2. Ectatic ascending aorta. Electronically Signed   By: Suzy Bouchard M.D.   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 EXAM: CT ABDOMEN AND PELVIS WITH CONTRAST TECHNIQUE: Multidetector CT imaging of the abdomen and  pelvis was performed using the standard protocol following bolus administration of intravenous contrast. CONTRAST:  113mL OMNIPAQUE IOHEXOL 300 MG/ML  SOLN COMPARISON:  12/18/2014 FINDINGS: Lower chest: Atelectasis identified at both lung bases. Heart size is normal. No imaged pericardial effusion or significant coronary artery calcifications. Upper abdomen: Interval resection of the distal pancreas and the spleen. The pancreatic head and neck have a normal appearance and enhance normally. There is a fluid collection in the surgical bed, measuring 15.7 x 6.1 cm. A fat fluid level identified within this collection. Small locules of gas are identified within this collection. There is a thin rim of enhancement surrounding this collection. Within the left upper quadrant, there is a collection of fluid within the mesentery, also containing a fat fluid level. Locules of gas are also identified within this collection which measures 4.1 x 13.8 x 20.7 cm. No definite rim enhancement associated with this collection. No focal abnormality identified within the liver. The spleen is surgically absent. Gallbladder is present. Small left renal cyst is present. No hydronephrosis. Adrenal glands are normal in appearance. Gastrointestinal tract: Moderate hiatal hernia. Stomach is displaced by fluid collections in the left upper quadrant. No evidence for bowel obstruction. Pelvis: Urinary bladder is distended. 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 Retroperitoneum: Atherosclerotic calcification of the abdominal aorta. No significant adenopathy. Abdominal wall: Postoperative changes are identified in the anterior abdominal wall. Fat containing left inguinal hernia. Osseous structures: Unremarkable. IMPRESSION: 1. Interval resection of the  pancreatic body and tail ; splenectomy. 2. New fluid collections within the left upper quadrant, likely representing early pseudo cysts given the presence  of fat fluid levels. The presence of gas within these collections may indicate recent surgery or developing abscess. 3. Hiatal hernia. 4. Bladder diverticulum. 5. Atherosclerotic calcification of the abdominal aorta. The salient findings were discussed with TODD GERKIN on 03/19/2015 at 3:27 pm. Electronically Signed   By: Nolon Nations M.D.   On: 03/19/2015 15:34    Anti-infectives: Anti-infectives    Start     Dose/Rate Route Frequency Ordered Stop   03/20/15 1600  ertapenem (INVANZ) 1 g in sodium chloride 0.9 % 50 mL IVPB     1 g 100 mL/hr over 30 Minutes Intravenous Every 24 hours 03/19/15 1604     03/19/15 1615  ertapenem (INVANZ) 1 g in sodium chloride 0.9 % 50 mL IVPB     1 g 100 mL/hr over 30 Minutes Intravenous NOW 03/19/15 1602 03/19/15 1656      Assessment/Plan: Status post distal pancreatectomy, readmitted with large fluid collection in the left upper abdomen. Probable pseudocyst/contained pancreatic leak or early abscess. For percutaneous drainage today.     LOS: 1 day    Shooter Tangen T 03/20/2015

## 2015-03-20 NOTE — Procedures (Signed)
Technically successful CT guided placed of a 10 Fr drainage catheter placement into the pancreatic resection bed yielding 1 L of bilious, slightly blood tinged fluid.   Technically successful CT guided placed of a 10 Fr drainage catheter placement into the left lateral abdomen yielding 0.5 L of bilious, slightly blood tinged fluid.   A sample of aspirated fluid wsa sent to the laboratory for analysis.   No immediate post procedural complications.   Ronny Bacon, MD Pager #: [phone]

## 2015-03-20 NOTE — Progress Notes (Addendum)
md notified of pt condition, agreed with narcan. Pt much more alert after narcan. Pupils now 3 and reacting briskly, able to cough up moderate amt of thick clear mucus.  Continue to monitor

## 2015-03-20 NOTE — Progress Notes (Signed)
Transferred via bed with O2 tank to ICU stepdown bed 1213. Accompanied by Rapid Response nurse, NT and primary nurse Lesly Rubenstein, RN.

## 2015-03-20 NOTE — Progress Notes (Signed)
This nurse assisted with transport of pt to stepdown.  Nurse gave report to receiving RN in stepdown.  Family at bedside.

## 2015-03-20 NOTE — Progress Notes (Signed)
CRITICAL VALUE ALERT  Critical value received:  MRSA positive  Date of notification:  03/20/15  Time of notification:  [id number]  Critical value read back:Yes.    Nurse who received alert:  Mitzie Na  MD notified (1st page):  Already on the chart on admission. No change.   Time of first page:  1520  MD notified (2nd page):  Time of second page:  Responding MD:    Time MD responded:

## 2015-03-20 NOTE — Progress Notes (Signed)
ANTIBIOTIC CONSULT NOTE - INITIAL  Pharmacy Consult for vancomycin Indication: rule out sepsis  Allergies  Allergen Reactions  . Morphine And Related Nausea And Vomiting  . Oxycontin [Oxycodone Hcl] Swelling    Lips swells  . Quinine Other (See Comments)    Platelets dropped  . Voltaren [Diclofenac Sodium] Other (See Comments)    Elevated liver enzymes    Patient Measurements: Height: 6' (182.9 cm) Weight: 245 lb (111.131 kg) IBW/kg (Calculated) : 77.6   Vital Signs: Temp: 101.3 F (38.5 C) (01/28 1744) Temp Source: Axillary (01/28 1744) BP: 135/72 mmHg (01/28 1744) Pulse Rate: 98 (01/28 1744) Intake/Output from previous day: 01/27 0701 - 01/28 0700 In: 3050 [I.V.:3000; IV Piggyback:50] Out: 1025 [Urine:1025] Intake/Output from this shift: Total I/O In: 1010 [I.V.:1000; Other:10] Out: 20 [Drains:20]  Labs:  Recent Labs  03/18/15 1859  WBC 13.9*  HGB 10.4*  PLT 956*  CREATININE 0.85   Estimated Creatinine Clearance: 104.1 mL/min (by C-G formula based on Cr of 0.85). No results for input(s): VANCOTROUGH, VANCOPEAK, VANCORANDOM, GENTTROUGH, GENTPEAK, GENTRANDOM, TOBRATROUGH, TOBRAPEAK, TOBRARND, AMIKACINPEAK, AMIKACINTROU, AMIKACIN in the last 72 hours.   Microbiology: Recent Results (from the past 720 hour(s))  Surgical pcr screen     Status: Abnormal   Collection Time: 02/19/15  9:15 AM  Result Value Ref Range Status   MRSA, PCR POSITIVE (A) NEGATIVE Final    Comment: RESULT CALLED TO, READ BACK BY AND VERIFIED WITH: REED,D RN AT 1559 ON 12.30.2016 BY EPPERSON,S    Staphylococcus aureus POSITIVE (A) NEGATIVE Final    Comment:        The Xpert SA Assay (FDA approved for NASAL specimens in patients over 17 years of age), is one component of a comprehensive surveillance program.  Test performance has been validated by [facility] for patients greater than or equal to 55 year old. It is not intended to diagnose infection nor to guide or monitor  treatment.   MRSA PCR Screening     Status: Abnormal   Collection Time: 03/20/15 11:17 AM  Result Value Ref Range Status   MRSA by PCR POSITIVE (A) NEGATIVE Final    Comment:        The GeneXpert MRSA Assay (FDA approved for NASAL specimens only), is one component of a comprehensive MRSA colonization surveillance program. It is not intended to diagnose MRSA infection nor to guide or monitor treatment for MRSA infections. RESULT CALLED TO, READ BACK BY AND VERIFIED WITH: J.REYNOLDS RN AT [id number] ON 03/20/15 BY S.VANHOORNE     Medical History: Past Medical History  Diagnosis Date  . Hypertension   . Diabetes mellitus   . GERD (gastroesophageal reflux disease)   . Meniere's disease   . Morbid obesity ([place])   . Depression   . Arthritis   . 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 Anxiety     prone to "panic attacks"  . 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 History of kidney stones     multiple kidney stones-not a problem at present  . Kidney stone   . Dermatitis     bilateral hands  . BPH (benign prostatic hyperplasia)   . Shortness of breath dyspnea   . 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 DDD (degenerative disc disease), lumbar     gets back injections   . 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 Straining on  urination   . Obstructive sleep apnea     " i used to have sleep apnea" never used a c-pap   . Cancer [facility])     newly diagnosed pancreatic cancer    Assessment: Patient's a 70 y.o M underwent distal pancreatectomy and splenectomy for neuroendocrine tumor of the pancreas on 02/25/15. She [facility] on 1/26 with c/o back pain, n/v, and LUQ abdominal pain. CT scan shows large fluid collection in the left upper abdomen with concern for pseudocyst/contained pancreatic leak or early abscess.  Patient was subsequently started on ertapenem and received CT guided drainage on 1/28.  He continues to be  febrile.  To add vancomycin to abx regimen for broad coverage for suspected sepsis.  - Tmax 101.3, scr 0.85 (crcl~82 N)  1/28 vanc>> 1/27 ertapenem>>  1/28 abscess cx: 1/28 MRSA PCR (+)  Goal of Therapy:  Vancomycin trough level 15-20 mcg/ml  Plan:  - vancomycin 2500 mg IV x1, then 750 mg IV q12h - ertapenem 1gm IV q24h per MD   Derek [name] 03/20/2015,6:20 PM

## 2015-03-20 NOTE — Progress Notes (Signed)
Stephanie Dillion, RN -Rapid Response in to apply monitor to pt and transfer pt to ICU Stepdown. Reported Hx , procedures done today and recent findings. Aware of all MD orders.

## 2015-03-20 NOTE — Progress Notes (Signed)
Primary nurse, Lesly Rubenstein, RN, in to assess. Bladder scanned with 749 ml urine resulting. Pt remains tachypneic with Resp 24 and HR 119. Dr. Barry Dienes aware via phone of pt's VS results and bladder scan results. See new orders received which included to transfer pt to stepdown. Rapid Response nurse, Nunzio Cory, RN notified to assist with transfer.

## 2015-03-21 LAB — CBC
HCT: 33.8 % — ABNORMAL LOW (ref 39.0–52.0)
Hemoglobin: 10.3 g/dL — ABNORMAL LOW (ref 13.0–17.0)
MCH: 23.7 pg — ABNORMAL LOW (ref 26.0–34.0)
MCHC: 30.5 g/dL (ref 30.0–36.0)
MCV: 77.9 fL — ABNORMAL LOW (ref 78.0–100.0)
Platelets: 892 10*3/uL — ABNORMAL HIGH (ref 150–400)
RBC: 4.34 MIL/uL (ref 4.22–5.81)
RDW: 14.8 % (ref 11.5–15.5)
WBC: 20.7 10*3/uL — ABNORMAL HIGH (ref 4.0–10.5)

## 2015-03-21 LAB — BASIC METABOLIC PANEL
Anion gap: 9 (ref 5–15)
BUN: 13 mg/dL (ref 6–20)
CO2: 23 mmol/L (ref 22–32)
Calcium: 10.5 mg/dL — ABNORMAL HIGH (ref 8.9–10.3)
Chloride: 97 mmol/L — ABNORMAL LOW (ref 101–111)
Creatinine, Ser: 1.03 mg/dL (ref 0.61–1.24)
GFR calc Af Amer: >60 mL/min (ref >60)
GFR calc non Af Amer: >60 mL/min (ref >60)
Glucose, Bld: 161 mg/dL — ABNORMAL HIGH (ref 65–99)
Potassium: 5.4 mmol/L — ABNORMAL HIGH (ref 3.5–5.1)
Sodium: 129 mmol/L — ABNORMAL LOW (ref 135–145)

## 2015-03-21 LAB — GLUCOSE, CAPILLARY
Glucose-Capillary: 128 mg/dL — ABNORMAL HIGH (ref 65–99)
Glucose-Capillary: 131 mg/dL — ABNORMAL HIGH (ref 65–99)
Glucose-Capillary: 144 mg/dL — ABNORMAL HIGH (ref 65–99)
Glucose-Capillary: 159 mg/dL — ABNORMAL HIGH (ref 65–99)
Glucose-Capillary: 165 mg/dL — ABNORMAL HIGH (ref 65–99)
Glucose-Capillary: 171 mg/dL — ABNORMAL HIGH (ref 65–99)

## 2015-03-21 MED ORDER — FLUCONAZOLE IN SODIUM CHLORIDE 200-0.9 MG/100ML-% IV SOLN
200.0000 mg | INTRAVENOUS | Status: DC
  Administered 2015-03-21 – 2015-03-23 (×3): 200 mg via INTRAVENOUS
  Filled 2015-03-21 (×4): qty 100

## 2015-03-21 MED ORDER — TRAMADOL HCL 50 MG PO TABS
50.0000 mg | ORAL_TABLET | Freq: Four times a day (QID) | ORAL | Status: DC
  Administered 2015-03-21 – 2015-04-04 (×50): 50 mg via ORAL
  Filled 2015-03-21 (×52): qty 1

## 2015-03-21 MED ORDER — SODIUM CHLORIDE 0.9 % IV SOLN
INTRAVENOUS | Status: DC
  Administered 2015-03-21: 09:00:00 via INTRAVENOUS

## 2015-03-21 MED ORDER — ACETAMINOPHEN 325 MG PO TABS
650.0000 mg | ORAL_TABLET | Freq: Four times a day (QID) | ORAL | Status: DC
  Administered 2015-03-21 – 2015-03-23 (×7): 650 mg via ORAL
  Filled 2015-03-21 (×7): qty 2

## 2015-03-21 NOTE — Progress Notes (Signed)
Patient ID: Derek Mohr Sr., male   DOB: 1945-06-26, 70 y.o.   MRN: [id number]    Subjective: Pt had bad day yesterday.  He got drains and had significant pain afterward.  He then had mental status changes and decreased respiratory status and was transferred to stepdown.  Lactate was normal and patient was normotensive, but he had high fevers.  Added vancomycin and transferred downstairs.  Got a dose of narcan which helped.  D/c'd PCA.    Objective: Vital signs in last 24 hours: Temp:  [97.8 F (36.6 C)-102.9 F (39.4 C)] 99.9 F (37.7 C) (01/29 0400) Pulse Rate:  [86-123] 94 (01/29 0600) Resp:  [12-36] 28 (01/29 0600) BP: (113-155)/(54-86) 146/59 mmHg (01/29 0600) SpO2:  [91 %-100 %] 97 % (01/29 0600) Weight:  [108.3 kg (238 lb 12.1 oz)] 108.3 kg (238 lb 12.1 oz) (01/28 1840) Last BM Date: 03/17/15  Intake/Output from previous day: 01/28 0701 - 01/29 0700 In: 3510 [I.V.:3000; IV Piggyback:500] Out: 1845 [Urine:1600; Drains:245] Intake/Output this shift:    General appearance: sleeping, but arousable. Resp: clear to auscultation bilaterally GI: soft, non distended.  Drains with foul appearing drainage consistent with pancreatic fluid.   Incision/Wound: clean and dry  Lab Results:   Recent Labs  03/18/15 1859 03/21/15 0138  WBC 13.9* 20.7*  HGB 10.4* 10.3*  HCT 33.0* 33.8*  PLT 956* 892*   BMET  Recent Labs  03/18/15 1859 03/21/15 0138  NA 132* 129*  K 4.7 5.4*  CL 99* 97*  CO2 25 23  GLUCOSE 243* 161*  BUN 17 13  CREATININE 0.85 1.03  CALCIUM 10.4* 10.5*   CBG (last 3)   Recent Labs  03/20/15 2111 03/21/15 0035 03/21/15 0455  GLUCAP 165* 159* 131*      Studies/Results: Ct Abdomen Pelvis W Contrast  03/19/2015  CLINICAL DATA:  New dx pancreatic ca S/p partial pancreatic tumor removed and splenectomy abd pain and vomiting x 1 wk Surgery 02/25/2015. EXAM: CT ABDOMEN AND PELVIS WITH CONTRAST TECHNIQUE: Multidetector CT imaging of the abdomen and  pelvis was performed using the standard protocol following bolus administration of intravenous contrast. CONTRAST:  16m OMNIPAQUE IOHEXOL 300 MG/ML  SOLN COMPARISON:  12/18/2014 FINDINGS: Lower chest: Atelectasis identified at both lung bases. Heart size is normal. No imaged pericardial effusion or significant coronary artery calcifications. Upper abdomen: Interval resection of the distal pancreas and the spleen. The pancreatic head and neck have a normal appearance and enhance normally. There is a fluid collection in the surgical bed, measuring 15.7 x 6.1 cm. A fat fluid level identified within this collection. Small locules of gas are identified within this collection. There is a thin rim of enhancement surrounding this collection. Within the left upper quadrant, there is a collection of fluid within the mesentery, also containing a fat fluid level. Locules of gas are also identified within this collection which measures 4.1 x 13.8 x 20.7 cm. No definite rim enhancement associated with this collection. No focal abnormality identified within the liver. The spleen is surgically absent. Gallbladder is present. Small left renal cyst is present. No hydronephrosis. Adrenal glands are normal in appearance. Gastrointestinal tract: Moderate hiatal hernia. Stomach is displaced by fluid collections in the left upper quadrant. No evidence for bowel obstruction. Pelvis: Urinary bladder is distended. 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 Retroperitoneum: Atherosclerotic calcification of the abdominal aorta. No significant adenopathy. Abdominal wall: Postoperative changes are identified in the anterior abdominal wall. Fat containing  left inguinal hernia. Osseous structures: Unremarkable. IMPRESSION: 1. Interval resection of the pancreatic body and tail ; splenectomy. 2. New fluid collections within the left upper quadrant, likely representing early pseudo cysts given the presence  of fat fluid levels. The presence of gas within these collections may indicate recent surgery or developing abscess. 3. Hiatal hernia. 4. Bladder diverticulum. 5. Atherosclerotic calcification of the abdominal aorta. The salient findings were discussed with TODD GERKIN on 03/19/2015 at 3:27 pm. Electronically Signed   By: Nolon Nations M.D.   On: 03/19/2015 15:34   Dg Chest Port 1 View  03/20/2015  CLINICAL DATA:  Patient with sepsis. Prior distal pancreatectomy and splenectomy. EXAM: PORTABLE CHEST 1 VIEW COMPARISON:  Chest radiograph 03/18/2015. FINDINGS: Monitoring leads overlie the patient. Patient is rotated to the right. Low lung volumes. Enlarged cardiac and mediastinal contours, likely accentuated secondary to portable technique. Heterogeneous opacities left lung base. Possible small left pleural effusion. Pigtail drainage catheters project over the left upper quadrant. IMPRESSION: Heterogeneous opacities left lung base may represent atelectasis, aspiration or infection. Probable small left pleural effusion. Electronically Signed   By: Lovey Newcomer M.D.   On: 03/20/2015 18:57   Ct Image Guided Drainage Percut Cath  Peritoneal Retroperit  03/20/2015  INDICATION: History of distal pancreatectomy and splenectomy, now with postoperative fluid collections worrisome for pancreatic leak and pseudocyst formation. Please perform CT-guided percutaneous drainage catheter placement. EXAM: 1. CT-GUIDED PANCREATIC RESECTION BED PERCUTANEOUS DRAINAGE CATHETER PLACEMENT 2. CT-GUIDED LEFT MID ABDOMINAL PERCUTANEOUS DRAINAGE CATHETER PLACEMENT COMPARISON:  CT abdomen pelvis - 03/19/2015; 11/28/2014 MEDICATIONS: The patient is currently admitted to the hospital and receiving intravenous antibiotics. The antibiotics were administered within an appropriate time frame prior to the initiation of the procedure. ANESTHESIA/SEDATION: Moderate (conscious) sedation was employed during this procedure. A total of Versed 100 mg and  Fentanyl 2 mcg was administered intravenously. Moderate Sedation Time: 37 minutes. The patient's level of consciousness and vital signs were monitored continuously by radiology nursing throughout the procedure under my direct supervision. CONTRAST:  None COMPLICATIONS: None immediate. PROCEDURE: Informed written consent was obtained from the patient after a discussion of the risks, benefits and alternatives to treatment. The patient was placed supine, slightly RPO on the CT gantry and a pre procedural CT was performed re-demonstrating the known abscess/fluid collection within the pancreatic resection bed with dominant component measuring at least 4.1 x 18.5 cm (image 21, series 2 and an adjacent complex fluid collection within the left mid/ lateral abdomen measuring at least 15.6 x 11 cm (image 43, series 2). The procedure was planned. A timeout was performed prior to the initiation of the procedure. The skin overlying the lateral aspect of the left upper abdominal quadrant was prepped and draped in the usual sterile fashion. The overlying soft tissues were anesthetized with 1% lidocaine with epinephrine. Appropriate trajectory was planned for both fluid collections with the use of a 22 gauge spinal needle. An 18 gauge trocar needles were advanced into the abscess/fluid collection within the pancreatic resection bed an within the mid abdomen and a short Amplatz super stiff wires were coiled within both collections. Appropriate positioning was confirmed with a limited CT scan. The tract was serially dilated allowing placement of a 10 [place] all-purpose drainage catheters at both locations. Appropriate positioning was confirmed with a limited postprocedural CT scan. Approximately 1 L of bilious appearing, slightly blood tinged though non foul smell fluid was aspirated from the pancreatic resection bed catheter. A sample from this collection was capped and  sent to the laboratory for analysis. Approximately 0.5 L of  bilious appearing, slightly blood tinged though non foul smell fluid was aspirated from the fluid collection within the mid abdomen. A limited post drainage CT scan was obtained. The tubes were connected to a drainage bags and sutured in place. Dressings were placed. The patient tolerated the procedure well without immediate post procedural complication. IMPRESSION: 1. Successful CT guided placement of a 10 [place] all purpose drain catheter into the pancreatic resection bed with aspiration of 1 L of bilious appearing, slightly blood tinged though non foul smell fluid fluid. Samples were sent to the laboratory as requested by the ordering clinical team. 2. Successful CT guided placement of a 10 [place] all purpose drain catheter into the left mid abdomen with aspiration of 0.5 L of bilious appearing, slightly blood tinged though non foul smell fluid fluid. Electronically Signed   By: Sandi Mariscal M.D.   On: 03/20/2015 11:44   Ct Image Guided Drainage Percut Cath  Peritoneal Retroperit  03/20/2015  INDICATION: History of distal pancreatectomy and splenectomy, now with postoperative fluid collections worrisome for pancreatic leak and pseudocyst formation. Please perform CT-guided percutaneous drainage catheter placement. EXAM: 1. CT-GUIDED PANCREATIC RESECTION BED PERCUTANEOUS DRAINAGE CATHETER PLACEMENT 2. CT-GUIDED LEFT MID ABDOMINAL PERCUTANEOUS DRAINAGE CATHETER PLACEMENT COMPARISON:  CT abdomen pelvis - 03/19/2015; 11/28/2014 MEDICATIONS: The patient is currently admitted to the hospital and receiving intravenous antibiotics. The antibiotics were administered within an appropriate time frame prior to the initiation of the procedure. ANESTHESIA/SEDATION: Moderate (conscious) sedation was employed during this procedure. A total of Versed 100 mg and Fentanyl 2 mcg was administered intravenously. Moderate Sedation Time: 37 minutes. The patient's level of consciousness and vital signs were monitored continuously by  radiology nursing throughout the procedure under my direct supervision. CONTRAST:  None COMPLICATIONS: None immediate. PROCEDURE: Informed written consent was obtained from the patient after a discussion of the risks, benefits and alternatives to treatment. The patient was placed supine, slightly RPO on the CT gantry and a pre procedural CT was performed re-demonstrating the known abscess/fluid collection within the pancreatic resection bed with dominant component measuring at least 4.1 x 18.5 cm (image 21, series 2 and an adjacent complex fluid collection within the left mid/ lateral abdomen measuring at least 15.6 x 11 cm (image 43, series 2). The procedure was planned. A timeout was performed prior to the initiation of the procedure. The skin overlying the lateral aspect of the left upper abdominal quadrant was prepped and draped in the usual sterile fashion. The overlying soft tissues were anesthetized with 1% lidocaine with epinephrine. Appropriate trajectory was planned for both fluid collections with the use of a 22 gauge spinal needle. An 18 gauge trocar needles were advanced into the abscess/fluid collection within the pancreatic resection bed an within the mid abdomen and a short Amplatz super stiff wires were coiled within both collections. Appropriate positioning was confirmed with a limited CT scan. The tract was serially dilated allowing placement of a 10 [place] all-purpose drainage catheters at both locations. Appropriate positioning was confirmed with a limited postprocedural CT scan. Approximately 1 L of bilious appearing, slightly blood tinged though non foul smell fluid was aspirated from the pancreatic resection bed catheter. A sample from this collection was capped and sent to the laboratory for analysis. Approximately 0.5 L of bilious appearing, slightly blood tinged though non foul smell fluid was aspirated from the fluid collection within the mid abdomen. A limited post drainage CT scan was  obtained. The tubes were connected to a drainage bags and sutured in place. Dressings were placed. The patient tolerated the procedure well without immediate post procedural complication. IMPRESSION: 1. Successful CT guided placement of a 10 [place] all purpose drain catheter into the pancreatic resection bed with aspiration of 1 L of bilious appearing, slightly blood tinged though non foul smell fluid fluid. Samples were sent to the laboratory as requested by the ordering clinical team. 2. Successful CT guided placement of a 10 [place] all purpose drain catheter into the left mid abdomen with aspiration of 0.5 L of bilious appearing, slightly blood tinged though non foul smell fluid fluid. Electronically Signed   By: Sandi Mariscal M.D.   On: 03/20/2015 11:44    Anti-infectives: Anti-infectives    Start     Dose/Rate Route Frequency Ordered Stop   03/21/15 0800  vancomycin (VANCOCIN) IVPB 750 mg/150 ml premix     750 mg 150 mL/hr over 60 Minutes Intravenous Every 12 hours 03/20/15 1840     03/21/15 0800  fluconazole (DIFLUCAN) IVPB 200 mg     200 mg 100 mL/hr over 60 Minutes Intravenous Every 24 hours 03/21/15 0755     03/20/15 1845  vancomycin (VANCOCIN) 2,500 mg in sodium chloride 0.9 % 500 mL IVPB     2,500 mg 250 mL/hr over 120 Minutes Intravenous STAT 03/20/15 1840 03/20/15 2209   03/20/15 1600  ertapenem (INVANZ) 1 g in sodium chloride 0.9 % 50 mL IVPB     1 g 100 mL/hr over 30 Minutes Intravenous Every 24 hours 03/19/15 1604     03/19/15 1615  ertapenem (INVANZ) 1 g in sodium chloride 0.9 % 50 mL IVPB     1 g 100 mL/hr over 30 Minutes Intravenous NOW 03/19/15 1602 03/19/15 1656      Assessment/Plan: Status post distal pancreatectomy, readmitted with large fluid collection in the left upper abdomen.   Appears to be pancreatic leak. Broad spectrum antibiotics Clears as tolerated. Will do around-the-clock tylenol and tramadol today for assistance with pain control to see if he can  tolerate lower narcotic dosing. Ambulate.  Discussed with daughter at bedside.     LOS: 2 days    [facility] 03/21/2015

## 2015-03-21 NOTE — Progress Notes (Signed)
Referring Physician(s): Dr. Harlow Asa  Chief Complaint: Pancreatic pseudocyst/collections  Subjective: Pt now s/p perc drain X 2 LUQ peripancreatic fluid collections 1/28 Moved to SDU due to dyspnea, stable now. Resting in bed wife at bedside.  Allergies: Morphine and related; Oxycontin; Quinine; and Voltaren  Medications:  Current facility-administered medications:  .  0.9 %  sodium chloride infusion, , Intravenous, Continuous, Stark Klein, MD, Last Rate: 100 mL/hr at 03/21/15 0927 .  acetaminophen (TYLENOL) tablet 650 mg, 650 mg, Oral, Q6H, Stark Klein, MD, Stopped at 03/21/15 0800 .  Chlorhexidine Gluconate Cloth 2 % PADS 6 each, 6 each, Topical, Q0600, Armandina Gemma, MD, Stopped at 03/21/15 1000 .  ertapenem (INVANZ) 1 g in sodium chloride 0.9 % 50 mL IVPB, 1 g, Intravenous, Q24H, Armandina Gemma, MD, 1 g at 03/20/15 1548 .  fluconazole (DIFLUCAN) IVPB 200 mg, 200 mg, Intravenous, Q24H, Stark Klein, MD, 200 mg at 03/21/15 0927 .  HYDROmorphone (DILAUDID) injection 1-2 mg, 1-2 mg, Intravenous, Q2H PRN, Stark Klein, MD, 1 mg at 03/21/15 0701 .  insulin aspart (novoLOG) injection 0-15 Units, 0-15 Units, Subcutaneous, 6 times per day, Armandina Gemma, MD, 2 Units at 03/21/15 0800 .  mupirocin ointment (BACTROBAN) 2 % 1 application, 1 application, Nasal, BID, Armandina Gemma, MD, 1 application at [id number] .  ondansetron (ZOFRAN-ODT) disintegrating tablet 4 mg, 4 mg, Oral, Q6H PRN **OR** ondansetron (ZOFRAN) injection 4 mg, 4 mg, Intravenous, Q6H PRN, Armandina Gemma, MD, 4 mg at 03/18/15 1747 .  pantoprazole (PROTONIX) injection 40 mg, 40 mg, Intravenous, QHS, Armandina Gemma, MD, 40 mg at 03/20/15 2138 .  traMADol (ULTRAM) tablet 50 mg, 50 mg, Oral, Q6H, Stark Klein, MD, Stopped at 03/21/15 0800 .  vancomycin (VANCOCIN) IVPB 750 mg/150 ml premix, 750 mg, Intravenous, Q12H, Anh P Pham, RPH, 750 mg at 03/21/15 1002    Vital Signs: BP 146/59 mmHg  Pulse 94  Temp(Src) 99.7 F (37.6 C) (Core  (Comment))  Resp 28  Ht 6\' 2"  (1.88 m)  Wt 238 lb 12.1 oz (108.3 kg)  BMI 30.64 kg/m2  SpO2 97%  Physical Exam  Abdominal:  Soft. Drains intact, sites clean. #1 with more brown/dark bilious output #2 with more thin purulent    Imaging: Dg Chest 2 View  03/18/2015  CLINICAL DATA:  Productive cough for 2 days EXAM: CHEST  2 VIEW COMPARISON:  Radiograph 02/25/2015 FINDINGS: The cardiac silhouette is normal. The ascending aorta is ectatic. No effusion, infiltrate or pneumothorax. IMPRESSION: 1. No acute cardiopulmonary findings. 2. Ectatic ascending aorta. Electronically Signed   By: Suzy Bouchard M.D.   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 EXAM: CT ABDOMEN AND PELVIS WITH CONTRAST TECHNIQUE: Multidetector CT imaging of the abdomen and pelvis was performed using the standard protocol following bolus administration of intravenous contrast. CONTRAST:  164mL OMNIPAQUE IOHEXOL 300 MG/ML  SOLN COMPARISON:  12/18/2014 FINDINGS: Lower chest: Atelectasis identified at both lung bases. Heart size is normal. No imaged pericardial effusion or significant coronary artery calcifications. Upper abdomen: Interval resection of the distal pancreas and the spleen. The pancreatic head and neck have a normal appearance and enhance normally. There is a fluid collection in the surgical bed, measuring 15.7 x 6.1 cm. A fat fluid level identified within this collection. Small locules of gas are identified within this collection. There is  a thin rim of enhancement surrounding this collection. Within the left upper quadrant, there is a collection of fluid within the mesentery, also containing a fat fluid level. Locules of gas are also identified within this collection which measures 4.1 x 13.8 x 20.7 cm. No definite rim enhancement associated with this  collection. No focal abnormality identified within the liver. The spleen is surgically absent. Gallbladder is present. Small left renal cyst is present. No hydronephrosis. Adrenal glands are normal in appearance. Gastrointestinal tract: Moderate hiatal hernia. Stomach is displaced by fluid collections in the left upper quadrant. No evidence for bowel obstruction. Pelvis: Urinary bladder is distended. 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 Retroperitoneum: Atherosclerotic calcification of the abdominal aorta. No significant adenopathy. Abdominal wall: Postoperative changes are identified in the anterior abdominal wall. Fat containing left inguinal hernia. Osseous structures: Unremarkable. IMPRESSION: 1. Interval resection of the pancreatic body and tail ; splenectomy. 2. New fluid collections within the left upper quadrant, likely representing early pseudo cysts given the presence of fat fluid levels. The presence of gas within these collections may indicate recent surgery or developing abscess. 3. Hiatal hernia. 4. Bladder diverticulum. 5. Atherosclerotic calcification of the abdominal aorta. The salient findings were discussed with TODD GERKIN on 03/19/2015 at 3:27 pm. Electronically Signed   By: Nolon Nations M.D.   On: 03/19/2015 15:34   Dg Chest Port 1 View  03/20/2015  CLINICAL DATA:  Patient with sepsis. Prior distal pancreatectomy and splenectomy. EXAM: PORTABLE CHEST 1 VIEW COMPARISON:  Chest radiograph 03/18/2015. FINDINGS: Monitoring leads overlie the patient. Patient is rotated to the right. Low lung volumes. Enlarged cardiac and mediastinal contours, likely accentuated secondary to portable technique. Heterogeneous opacities left lung base. Possible small left pleural effusion. Pigtail drainage catheters project over the left upper quadrant. IMPRESSION: Heterogeneous opacities left lung base may represent atelectasis, aspiration or infection. Probable  small left pleural effusion. Electronically Signed   By: Lovey Newcomer M.D.   On: 03/20/2015 18:57   Ct Image Guided Drainage Percut Cath  Peritoneal Retroperit  03/20/2015  INDICATION: History of distal pancreatectomy and splenectomy, now with postoperative fluid collections worrisome for pancreatic leak and pseudocyst formation. Please perform CT-guided percutaneous drainage catheter placement. EXAM: 1. CT-GUIDED PANCREATIC RESECTION BED PERCUTANEOUS DRAINAGE CATHETER PLACEMENT 2. CT-GUIDED LEFT MID ABDOMINAL PERCUTANEOUS DRAINAGE CATHETER PLACEMENT COMPARISON:  CT abdomen pelvis - 03/19/2015; 11/28/2014 MEDICATIONS: The patient is currently admitted to the hospital and receiving intravenous antibiotics. The antibiotics were administered within an appropriate time frame prior to the initiation of the procedure. ANESTHESIA/SEDATION: Moderate (conscious) sedation was employed during this procedure. A total of Versed 100 mg and Fentanyl 2 mcg was administered intravenously. Moderate Sedation Time: 37 minutes. The patient's level of consciousness and vital signs were monitored continuously by radiology nursing throughout the procedure under my direct supervision. CONTRAST:  None COMPLICATIONS: None immediate. PROCEDURE: Informed written consent was obtained from the patient after a discussion of the risks, benefits and alternatives to treatment. The patient was placed supine, slightly RPO on the CT gantry and a pre procedural CT was performed re-demonstrating the known abscess/fluid collection within the pancreatic resection bed with dominant component measuring at least 4.1 x 18.5 cm (image 21, series 2 and an adjacent complex fluid collection within the left mid/ lateral abdomen measuring at least 15.6 x 11 cm (image 43, series 2). The procedure was planned. A timeout was performed prior to the initiation of the procedure. The skin overlying the lateral  aspect of the left upper abdominal quadrant was prepped and  draped in the usual sterile fashion. The overlying soft tissues were anesthetized with 1% lidocaine with epinephrine. Appropriate trajectory was planned for both fluid collections with the use of a 22 gauge spinal needle. An 18 gauge trocar needles were advanced into the abscess/fluid collection within the pancreatic resection bed an within the mid abdomen and a short Amplatz super stiff wires were coiled within both collections. Appropriate positioning was confirmed with a limited CT scan. The tract was serially dilated allowing placement of a 10 [place] all-purpose drainage catheters at both locations. Appropriate positioning was confirmed with a limited postprocedural CT scan. Approximately 1 L of bilious appearing, slightly blood tinged though non foul smell fluid was aspirated from the pancreatic resection bed catheter. A sample from this collection was capped and sent to the laboratory for analysis. Approximately 0.5 L of bilious appearing, slightly blood tinged though non foul smell fluid was aspirated from the fluid collection within the mid abdomen. A limited post drainage CT scan was obtained. The tubes were connected to a drainage bags and sutured in place. Dressings were placed. The patient tolerated the procedure well without immediate post procedural complication. IMPRESSION: 1. Successful CT guided placement of a 10 [place] all purpose drain catheter into the pancreatic resection bed with aspiration of 1 L of bilious appearing, slightly blood tinged though non foul smell fluid fluid. Samples were sent to the laboratory as requested by the ordering clinical team. 2. Successful CT guided placement of a 10 [place] all purpose drain catheter into the left mid abdomen with aspiration of 0.5 L of bilious appearing, slightly blood tinged though non foul smell fluid fluid. Electronically Signed   By: Sandi Mariscal M.D.   On: 03/20/2015 11:44   Ct Image Guided Drainage Percut Cath  Peritoneal  Retroperit  03/20/2015  INDICATION: History of distal pancreatectomy and splenectomy, now with postoperative fluid collections worrisome for pancreatic leak and pseudocyst formation. Please perform CT-guided percutaneous drainage catheter placement. EXAM: 1. CT-GUIDED PANCREATIC RESECTION BED PERCUTANEOUS DRAINAGE CATHETER PLACEMENT 2. CT-GUIDED LEFT MID ABDOMINAL PERCUTANEOUS DRAINAGE CATHETER PLACEMENT COMPARISON:  CT abdomen pelvis - 03/19/2015; 11/28/2014 MEDICATIONS: The patient is currently admitted to the hospital and receiving intravenous antibiotics. The antibiotics were administered within an appropriate time frame prior to the initiation of the procedure. ANESTHESIA/SEDATION: Moderate (conscious) sedation was employed during this procedure. A total of Versed 100 mg and Fentanyl 2 mcg was administered intravenously. Moderate Sedation Time: 37 minutes. The patient's level of consciousness and vital signs were monitored continuously by radiology nursing throughout the procedure under my direct supervision. CONTRAST:  None COMPLICATIONS: None immediate. PROCEDURE: Informed written consent was obtained from the patient after a discussion of the risks, benefits and alternatives to treatment. The patient was placed supine, slightly RPO on the CT gantry and a pre procedural CT was performed re-demonstrating the known abscess/fluid collection within the pancreatic resection bed with dominant component measuring at least 4.1 x 18.5 cm (image 21, series 2 and an adjacent complex fluid collection within the left mid/ lateral abdomen measuring at least 15.6 x 11 cm (image 43, series 2). The procedure was planned. A timeout was performed prior to the initiation of the procedure. The skin overlying the lateral aspect of the left upper abdominal quadrant was prepped and draped in the usual sterile fashion. The overlying soft tissues were anesthetized with 1% lidocaine with epinephrine. Appropriate trajectory was planned  for both fluid collections with the  use of a 22 gauge spinal needle. An 18 gauge trocar needles were advanced into the abscess/fluid collection within the pancreatic resection bed an within the mid abdomen and a short Amplatz super stiff wires were coiled within both collections. Appropriate positioning was confirmed with a limited CT scan. The tract was serially dilated allowing placement of a 10 [place] all-purpose drainage catheters at both locations. Appropriate positioning was confirmed with a limited postprocedural CT scan. Approximately 1 L of bilious appearing, slightly blood tinged though non foul smell fluid was aspirated from the pancreatic resection bed catheter. A sample from this collection was capped and sent to the laboratory for analysis. Approximately 0.5 L of bilious appearing, slightly blood tinged though non foul smell fluid was aspirated from the fluid collection within the mid abdomen. A limited post drainage CT scan was obtained. The tubes were connected to a drainage bags and sutured in place. Dressings were placed. The patient tolerated the procedure well without immediate post procedural complication. IMPRESSION: 1. Successful CT guided placement of a 10 [place] all purpose drain catheter into the pancreatic resection bed with aspiration of 1 L of bilious appearing, slightly blood tinged though non foul smell fluid fluid. Samples were sent to the laboratory as requested by the ordering clinical team. 2. Successful CT guided placement of a 10 [place] all purpose drain catheter into the left mid abdomen with aspiration of 0.5 L of bilious appearing, slightly blood tinged though non foul smell fluid fluid. Electronically Signed   By: Sandi Mariscal M.D.   On: 03/20/2015 11:44    Labs:  CBC:  Recent Labs  02/25/15 2115 02/26/15 0334 03/18/15 1859 03/21/15 0138  WBC 13.7* 13.2* 13.9* 20.7*  HGB 13.0 12.8* 10.4* 10.3*  HCT 41.7 42.1 33.0* 33.8*  PLT 288 285 956* 892*     COAGS:  Recent Labs  02/19/15 0920 03/18/15 1859  INR 1.07 1.33  APTT 28  --     BMP:  Recent Labs  02/26/15 0334 02/27/15 0340 03/18/15 1859 03/21/15 0138  NA 138 137 132* 129*  K 5.1 4.3 4.7 5.4*  CL 101 103 99* 97*  CO2 23 23 25 23   GLUCOSE 143* 257* 243* 161*  BUN 16 23* 17 13  CALCIUM 9.8 9.9 10.4* 10.5*  CREATININE 1.01 1.17 0.85 1.03  GFRNONAA >60 >60 >60 >60  GFRAA >60 >60 >60 >60    LIVER FUNCTION TESTS:  Recent Labs  02/19/15 0920 03/18/15 1859  BILITOT 0.6 0.7  AST 19 34  ALT 23 31  ALKPHOS 83 147*  PROT 6.9 7.1  ALBUMIN 4.2 2.7*    Assessment and Plan: S/p perc drain X 2 peripancreatic fluid collections. Drain intact and functioning well. IR following.  Electronically Signed: Ascencion Dike 03/21/2015, 12:23 PM   I spent a total of 15 Minutes at the the patient's bedside AND on the patient's hospital floor or unit, greater than 50% of which was counseling/coordinating care for drainage of peripancreatic fluid collections

## 2015-03-21 NOTE — Progress Notes (Signed)
Initial Nutrition Assessment  DOCUMENTATION CODES:   Severe malnutrition in context of chronic illness, Obesity unspecified  INTERVENTION:   -Diet advancement per MD -Once diet is advanced past clear liquids, recommend Glucerna Shake po TID, each supplement provides 220 kcal and 10 grams of protein -RD to continue to monitor  NUTRITION DIAGNOSIS:   Malnutrition related to chronic illness, cancer and cancer related treatments as evidenced by percent weight loss, energy intake < or equal to 75% for > or equal to 1 month.  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 Initially did well after discharge home. Seen by endocrinologist for management of IDDM. Developed progressive LUQ abd pain, nausea, emesis, and worsening back pain. Presented to [facility] office yesterday and admitted for further work up and treatment.  Pt in room asleep with wife at bedside. Per pt's wife, pt has had extremely poor appetite and PO intake over the past month and has worsened since distal pancreatectomy 3 weeks ago. Last time pt consumed solid food was last Wednesday 1/25 which was a few bites of a waffle. She states pt would consume 4-5 bites of food prior to surgery and would get full very quickly. Pt currently on clear liquids and only consuming ice chips. Pt has lost 25-30 lb per wife, weight history shows a 25 lb weight loss since 12/30 (9% wt loss x 1 month, significant for time frame). When diet is advanced, RD recommends nutritional supplementation such as Glucerna shakes.  Pt asleep and unable to perform full exam. Noticeable depletion in clavicle regions.  Labs reviewed: Low Na Elevated K  Diet Order:  Diet clear  liquid Room service appropriate?: Yes; Fluid consistency:: Thin  Skin:  Wound (see comment) (abdominal incision 1/05)  Last BM:  1/25  Height:   Ht Readings from Last 1 Encounters:  03/20/15 6\' 2"  (1.88 m)    Weight:   Wt Readings from Last 1 Encounters:  03/20/15 238 lb 12.1 oz (108.3 kg)    Ideal Body Weight:  86.4 kg  BMI:  Body mass index is 30.64 kg/(m^2).  Estimated Nutritional Needs:   Kcal:  [id number]  Protein:  120-130g  Fluid:  2.3-2.5 L/day  EDUCATION NEEDS:   No education needs identified at this time  Clayton Bibles, MS, RD, LDN Pager: [phone] After Hours Pager: [phone]

## 2015-03-22 DIAGNOSIS — K651 Peritoneal abscess: Secondary | ICD-10-CM | POA: Insufficient documentation

## 2015-03-22 LAB — GLUCOSE, CAPILLARY
Glucose-Capillary: 154 mg/dL — ABNORMAL HIGH (ref 65–99)
Glucose-Capillary: 197 mg/dL — ABNORMAL HIGH (ref 65–99)
Glucose-Capillary: 188 mg/dL — ABNORMAL HIGH (ref 65–99)
Glucose-Capillary: 140 mg/dL — ABNORMAL HIGH (ref 65–99)
Glucose-Capillary: 179 mg/dL — ABNORMAL HIGH (ref 65–99)
Glucose-Capillary: 200 mg/dL — ABNORMAL HIGH (ref 65–99)
Glucose-Capillary: 240 mg/dL — ABNORMAL HIGH (ref 65–99)
Glucose-Capillary: 193 mg/dL — ABNORMAL HIGH (ref 65–99)

## 2015-03-22 LAB — POTASSIUM: Potassium: 4.6 mmol/L (ref 3.5–5.1)

## 2015-03-22 LAB — PATHOLOGIST SMEAR REVIEW

## 2015-03-22 LAB — VANCOMYCIN, TROUGH: Vancomycin Tr: 10 ug/mL (ref 10.0–20.0)

## 2015-03-22 MED ORDER — KCL IN DEXTROSE-NACL 20-5-0.45 MEQ/L-%-% IV SOLN
INTRAVENOUS | Status: DC
  Administered 2015-03-22: 10:00:00 via INTRAVENOUS
  Filled 2015-03-22: qty 1000

## 2015-03-22 MED ORDER — VANCOMYCIN HCL IN DEXTROSE 1-5 GM/200ML-% IV SOLN
1000.0000 mg | Freq: Two times a day (BID) | INTRAVENOUS | Status: DC
  Administered 2015-03-22 – 2015-03-27 (×10): 1000 mg via INTRAVENOUS
  Filled 2015-03-22 (×9): qty 200

## 2015-03-22 MED ORDER — MAGNESIUM HYDROXIDE 400 MG/5ML PO SUSP
30.0000 mL | Freq: Once | ORAL | Status: AC
  Administered 2015-03-24: 30 mL via ORAL
  Filled 2015-03-22 (×2): qty 30

## 2015-03-22 MED ORDER — DEXTROSE-NACL 5-0.45 % IV SOLN
INTRAVENOUS | Status: DC
  Administered 2015-03-22 – 2015-03-23 (×2): via INTRAVENOUS

## 2015-03-22 NOTE — Progress Notes (Signed)
Date: March 22, 2015 Chart reviewed for concurrent status and case management needs. Will continue to follow patient for changes and needs: sepsis with pancreatic involvement Velva Harman, BSN, [place], [place]   [phone]

## 2015-03-22 NOTE — Progress Notes (Signed)
   03/22/15 1200  Clinical Encounter Type  Visited With Patient  Visit Type Initial;Psychological support;Spiritual support;Critical Care  Referral From Nurse  Consult/Referral To Chaplain  Spiritual Encounters  Spiritual Needs Emotional;Other (Comment) (Pastoral Conversation)  Stress Factors  Patient Stress Factors Health changes;Exhausted;Major life changes  Family Stress Factors Not reviewed   I visited with the patient per referral by the nurse who stated that he was going through a difficult time emotionally due to his multiple health problems.  When I arrived the patient was receptive to the visit, but was dozing off due to medication that he received. He was able to talk and stated that he was "tired" of being sick and going through everything. He would have short crying spells and say that he couldn't take being sick.  The nurse came in to see if she could find a vein to put an IV in. The patient looked straight ahead the entire time in a depressed fashion. He became slightly anxious and stated that he needed to call his wife. He felt that his wife needed to be up there right away and that something bad was wrong with him. The patient never elaborated, but I notified the nurse.  Patient stated that he appreciated pastoral care. Follow-up is needed.   [place] M.Div.

## 2015-03-22 NOTE — Progress Notes (Signed)
Patient ID: Derek Mohr Sr., male   DOB: [birth year], 70 y.o.   MRN: [id number]  [facility]  Subjective: Patient in stepdown unit, wife at bedside.  Fever this AM.  Somewhat decreased level of consciousness, lethargic.  Objective: Vital signs in last 24 hours: Temp:  [98.4 F (36.9 C)-102 F (38.9 C)] 101.1 F (38.4 C) (01/30 0600) Pulse Rate:  [75-96] 94 (01/30 0600) Resp:  [17-24] 19 (01/30 0600) BP: (118-167)/(55-77) 122/58 mmHg (01/30 0600) SpO2:  [92 %-99 %] 94 % (01/30 0600) Last BM Date: 03/17/15  Intake/Output from previous day: 01/29 0701 - 01/30 0700 In: 2455 [I.V.:1955; IV Piggyback:500] Out: 2155 [Urine:2000; Drains:155] Intake/Output this shift:    Physical Exam: HEENT - sclerae clear, mucous membranes moist Neck - soft Abdomen - soft without distension; wound healing uneventfully; drains left upper quad, thick, brownish output Ext - no edema, non-tender; IV right antecubital  Lab Results:   Recent Labs  03/21/15 0138  WBC 20.7*  HGB 10.3*  HCT 33.8*  PLT 892*   BMET  Recent Labs  03/21/15 0138  NA 129*  K 5.4*  CL 97*  CO2 23  GLUCOSE 161*  BUN 13  CREATININE 1.03  CALCIUM 10.5*   PT/INR No results for input(s): LABPROT, INR in the last 72 hours. Comprehensive Metabolic Panel:    Component Value Date/Time   NA 129* 03/21/2015 0138   NA 132* 03/18/2015 1859   K 5.4* 03/21/2015 0138   K 4.7 03/18/2015 1859   CL 97* 03/21/2015 0138   CL 99* 03/18/2015 1859   CO2 23 03/21/2015 0138   CO2 25 03/18/2015 1859   BUN 13 03/21/2015 0138   BUN 17 03/18/2015 1859   CREATININE 1.03 03/21/2015 0138   CREATININE 0.85 03/18/2015 1859   GLUCOSE 161* 03/21/2015 0138   GLUCOSE 243* 03/18/2015 1859   CALCIUM 10.5* 03/21/2015 0138   CALCIUM 10.4* 03/18/2015 1859   AST 34 03/18/2015 1859   AST 19 02/19/2015 0920   ALT 31 03/18/2015 1859   ALT 23 02/19/2015 0920   ALKPHOS 147* 03/18/2015 1859   ALKPHOS 83  02/19/2015 0920   BILITOT 0.7 03/18/2015 1859   BILITOT 0.6 02/19/2015 0920   PROT 7.1 03/18/2015 1859   PROT 6.9 02/19/2015 0920   ALBUMIN 2.7* 03/18/2015 1859   ALBUMIN 4.2 02/19/2015 0920    Studies/Results: Dg Chest Port 1 View  03/20/2015  CLINICAL DATA:  Patient with sepsis. Prior distal pancreatectomy and splenectomy. EXAM: PORTABLE CHEST 1 VIEW COMPARISON:  Chest radiograph 03/18/2015. FINDINGS: Monitoring leads overlie the patient. Patient is rotated to the right. Low lung volumes. Enlarged cardiac and mediastinal contours, likely accentuated secondary to portable technique. Heterogeneous opacities left lung base. Possible small left pleural effusion. Pigtail drainage catheters project over the left upper quadrant. IMPRESSION: Heterogeneous opacities left lung base may represent atelectasis, aspiration or infection. Probable small left pleural effusion. Electronically Signed   By: Lovey Newcomer M.D.   On: 03/20/2015 18:57   Ct Image Guided Drainage Percut Cath  Peritoneal Retroperit  03/20/2015  INDICATION: History of distal pancreatectomy and splenectomy, now with postoperative fluid collections worrisome for pancreatic leak and pseudocyst formation. Please perform CT-guided percutaneous drainage catheter placement. EXAM: 1. CT-GUIDED PANCREATIC RESECTION BED PERCUTANEOUS DRAINAGE CATHETER PLACEMENT 2. CT-GUIDED LEFT MID ABDOMINAL PERCUTANEOUS DRAINAGE CATHETER PLACEMENT COMPARISON:  CT abdomen pelvis - 03/19/2015; 11/28/2014 MEDICATIONS: The patient is currently admitted to the hospital and receiving intravenous antibiotics. The antibiotics were administered  within an appropriate time frame prior to the initiation of the procedure. ANESTHESIA/SEDATION: Moderate (conscious) sedation was employed during this procedure. A total of Versed 100 mg and Fentanyl 2 mcg was administered intravenously. Moderate Sedation Time: 37 minutes. The patient's level of consciousness and vital signs were monitored  continuously by radiology nursing throughout the procedure under my direct supervision. CONTRAST:  None COMPLICATIONS: None immediate. PROCEDURE: Informed written consent was obtained from the patient after a discussion of the risks, benefits and alternatives to treatment. The patient was placed supine, slightly RPO on the CT gantry and a pre procedural CT was performed re-demonstrating the known abscess/fluid collection within the pancreatic resection bed with dominant component measuring at least 4.1 x 18.5 cm (image 21, series 2 and an adjacent complex fluid collection within the left mid/ lateral abdomen measuring at least 15.6 x 11 cm (image 43, series 2). The procedure was planned. A timeout was performed prior to the initiation of the procedure. The skin overlying the lateral aspect of the left upper abdominal quadrant was prepped and draped in the usual sterile fashion. The overlying soft tissues were anesthetized with 1% lidocaine with epinephrine. Appropriate trajectory was planned for both fluid collections with the use of a 22 gauge spinal needle. An 18 gauge trocar needles were advanced into the abscess/fluid collection within the pancreatic resection bed an within the mid abdomen and a short Amplatz super stiff wires were coiled within both collections. Appropriate positioning was confirmed with a limited CT scan. The tract was serially dilated allowing placement of a 10 [place] all-purpose drainage catheters at both locations. Appropriate positioning was confirmed with a limited postprocedural CT scan. Approximately 1 L of bilious appearing, slightly blood tinged though non foul smell fluid was aspirated from the pancreatic resection bed catheter. A sample from this collection was capped and sent to the laboratory for analysis. Approximately 0.5 L of bilious appearing, slightly blood tinged though non foul smell fluid was aspirated from the fluid collection within the mid abdomen. A limited post  drainage CT scan was obtained. The tubes were connected to a drainage bags and sutured in place. Dressings were placed. The patient tolerated the procedure well without immediate post procedural complication. IMPRESSION: 1. Successful CT guided placement of a 10 [place] all purpose drain catheter into the pancreatic resection bed with aspiration of 1 L of bilious appearing, slightly blood tinged though non foul smell fluid fluid. Samples were sent to the laboratory as requested by the ordering clinical team. 2. Successful CT guided placement of a 10 [place] all purpose drain catheter into the left mid abdomen with aspiration of 0.5 L of bilious appearing, slightly blood tinged though non foul smell fluid fluid. Electronically Signed   By: Sandi Mariscal M.D.   On: 03/20/2015 11:44   Ct Image Guided Drainage Percut Cath  Peritoneal Retroperit  03/20/2015  INDICATION: History of distal pancreatectomy and splenectomy, now with postoperative fluid collections worrisome for pancreatic leak and pseudocyst formation. Please perform CT-guided percutaneous drainage catheter placement. EXAM: 1. CT-GUIDED PANCREATIC RESECTION BED PERCUTANEOUS DRAINAGE CATHETER PLACEMENT 2. CT-GUIDED LEFT MID ABDOMINAL PERCUTANEOUS DRAINAGE CATHETER PLACEMENT COMPARISON:  CT abdomen pelvis - 03/19/2015; 11/28/2014 MEDICATIONS: The patient is currently admitted to the hospital and receiving intravenous antibiotics. The antibiotics were administered within an appropriate time frame prior to the initiation of the procedure. ANESTHESIA/SEDATION: Moderate (conscious) sedation was employed during this procedure. A total of Versed 100 mg and Fentanyl 2 mcg was administered intravenously. Moderate Sedation Time: 37  minutes. The patient's level of consciousness and vital signs were monitored continuously by radiology nursing throughout the procedure under my direct supervision. CONTRAST:  None COMPLICATIONS: None immediate. PROCEDURE: Informed written  consent was obtained from the patient after a discussion of the risks, benefits and alternatives to treatment. The patient was placed supine, slightly RPO on the CT gantry and a pre procedural CT was performed re-demonstrating the known abscess/fluid collection within the pancreatic resection bed with dominant component measuring at least 4.1 x 18.5 cm (image 21, series 2 and an adjacent complex fluid collection within the left mid/ lateral abdomen measuring at least 15.6 x 11 cm (image 43, series 2). The procedure was planned. A timeout was performed prior to the initiation of the procedure. The skin overlying the lateral aspect of the left upper abdominal quadrant was prepped and draped in the usual sterile fashion. The overlying soft tissues were anesthetized with 1% lidocaine with epinephrine. Appropriate trajectory was planned for both fluid collections with the use of a 22 gauge spinal needle. An 18 gauge trocar needles were advanced into the abscess/fluid collection within the pancreatic resection bed an within the mid abdomen and a short Amplatz super stiff wires were coiled within both collections. Appropriate positioning was confirmed with a limited CT scan. The tract was serially dilated allowing placement of a 10 [place] all-purpose drainage catheters at both locations. Appropriate positioning was confirmed with a limited postprocedural CT scan. Approximately 1 L of bilious appearing, slightly blood tinged though non foul smell fluid was aspirated from the pancreatic resection bed catheter. A sample from this collection was capped and sent to the laboratory for analysis. Approximately 0.5 L of bilious appearing, slightly blood tinged though non foul smell fluid was aspirated from the fluid collection within the mid abdomen. A limited post drainage CT scan was obtained. The tubes were connected to a drainage bags and sutured in place. Dressings were placed. The patient tolerated the procedure well without  immediate post procedural complication. IMPRESSION: 1. Successful CT guided placement of a 10 [place] all purpose drain catheter into the pancreatic resection bed with aspiration of 1 L of bilious appearing, slightly blood tinged though non foul smell fluid fluid. Samples were sent to the laboratory as requested by the ordering clinical team. 2. Successful CT guided placement of a 10 [place] all purpose drain catheter into the left mid abdomen with aspiration of 0.5 L of bilious appearing, slightly blood tinged though non foul smell fluid fluid. Electronically Signed   By: Sandi Mariscal M.D.   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 OOB to chair today.  Up to floor tomorrow if more alert, responsive, mobile. Check labs in AM 1/31; await culture results.  [name], MD, [facility] Office: [place] 03/22/2015

## 2015-03-22 NOTE — Progress Notes (Signed)
Rx Brief Abx note:  IV Vancomycin  See 1/30 note by Alanda Amass for details  Assessement:  2130 VT=10 mg/L, below goal  SCr stable  Goal 15-20 mg/L  T=98.1  Plan:  Increase Vancomycin to 1Gm IV q12h  F/u SCr/cultures/additional levels as needed  Dorrene German 03/22/2015 10:50 PM

## 2015-03-22 NOTE — Care Management Note (Signed)
Case Management Note  Patient Details  Name: Derek OSORIA Sr. MRN: [id number] Date of Birth: November 08, 1945  Subjective/Objective:                    Action/Plan:   Expected Discharge Date:  03/23/15               Expected Discharge Plan:  Home/Self Care  In-House Referral:     Discharge planning Services  CM Consult  Post Acute Care Choice:    Choice offered to:     DME Arranged:    DME Agency:     HH Arranged:    HH Agency:     Status of Service:  In process, will continue to follow  Medicare Important Message Given:    Date Medicare IM Given:    Medicare IM give by:    Date Additional Medicare IM Given:    Additional Medicare Important Message give by:     If discussed at [place] of Stay Meetings, dates discussed:    Additional Comments:  Leeroy Cha, RN 03/22/2015, 10:37 AM

## 2015-03-22 NOTE — Progress Notes (Addendum)
Pharmacy Antibiotic Note  Patrice Ellers. is a 70 y.o. male admitted on 03/18/2015 on day 3 (full) of vancomycin and day 4 ertapenem for abscesses/fluid collections following distal pancreatectomy/splenectomy  Plan:  This patient's current antibiotics will be continued without adjustments.   Will check vancomycin trough tonight to ensure adequate levels, given continued fevers and leukocytosis (note 8AM dose was given at 10AM)  Temp (24hrs), Avg:100 F (37.8 C), Min:98.4 F (36.9 C), Max:102 F (38.9 C)   Recent Labs Lab 03/18/15 1859 03/20/15 2206 03/21/15 0138  WBC 13.9*  --  20.7*  CREATININE 0.85  --  1.03  LATICACIDVEN  --  0.9  --     Estimated Creatinine Clearance: 87.4 mL/min (by C-G formula based on Cr of 1.03).    Allergies  Allergen Reactions  . Morphine And Related Nausea And Vomiting  . Oxycontin [Oxycodone Hcl] Swelling    Lips swells  . Quinine Other (See Comments)    Platelets dropped  . Voltaren [Diclofenac Sodium] Other (See Comments)    Elevated liver enzymes   Today, 03/22/2015: Temp: 102 this AM, now afeb WBC: elevated, increasing Renal: SCr stable wnl; CrCl 88 CG, 67 N  Antimicrobials this admission: 1/28 vanc >> 1/27 ertapenem (MD)>> 1/29 diflucan (MD) >>  Dose adjustments this admission:  Microbiology results: 1/28 abscess cx: abundant CoNS 1/28 MRSA PCR (+)   Thank you for allowing pharmacy to be a part of this patient's care.  Reuel Boom, PharmD, BCPS Pager: [phone] 03/22/2015, 3:18 PM

## 2015-03-22 NOTE — Progress Notes (Signed)
Patient ID: Derek Mohr Sr., male   DOB: 11-03-1945, 70 y.o.   MRN: [id number]    Referring Physician(s): CCS  Chief Complaint: Pancreatic pseudocyst/collections   Subjective: Patient doing fairly well. Does have some left lateral abdominal/lower chest discomfort secondary to drains; denies N/V   Allergies: Morphine and related; Oxycontin; Quinine; and Voltaren  Medications: Prior to Admission medications   Medication Sig Start Date End Date Taking? Authorizing Provider  carvedilol (COREG) 25 MG tablet Take 25 mg by mouth 2 (two) times daily with a meal.  09/26/11  Yes Historical Provider, MD  [facility] TRIPLE MAGNESIUM COMPLEX PO Take 1-2 capsules by mouth 2 (two) times daily. Two capsules in the morning and one at night   Yes Historical Provider, MD  diphenhydrAMINE (BENADRYL) 25 mg capsule Take 50 mg by mouth 2 (two) times daily. Reported on 03/17/2015   Yes Historical Provider, MD  HYDROmorphone (DILAUDID) 2 MG tablet Take 1 tablet (2 mg total) by mouth every 3 (three) hours as needed for moderate pain or severe pain. 03/03/15  Yes Armandina Gemma, MD  insulin aspart (NOVOLOG) 100 UNIT/ML injection Inject 0-9 Units into the skin 3 (three) times daily before meals. 03/03/15  Yes Armandina Gemma, MD  insulin glargine (LANTUS) 100 UNIT/ML injection Inject 0.2 mLs (20 Units total) into the skin daily. 03/03/15  Yes Armandina Gemma, MD  losartan (COZAAR) 50 MG tablet Take 50 mg by mouth daily. 11/06/14  Yes Historical Provider, MD  naproxen sodium (ALEVE) 220 MG tablet Take 440 mg by mouth 2 (two) times daily with a meal.    Yes Historical Provider, MD  aspirin EC 81 MG tablet Take 81 mg by mouth at bedtime.    Historical Provider, MD  clobetasol ointment (TEMOVATE) [id number] % Apply 1 application topically daily as needed (for peeling skin on hands and feet). Reported on 03/17/2015    Historical Provider, MD  Insulin Syringes, Disposable, U-100 0.5 ML MISC 20 Units by Does not apply route daily. 03/03/15   Armandina Gemma, MD  lansoprazole (PREVACID) 15 MG capsule Take 30 mg by mouth daily as needed (reflux). Reported on 03/17/2015    Historical Provider, MD  [facility] DELICA LANCETS [id number] MISC 1 Device by Does not apply route 4 (four) times daily. 03/17/15   Renato Shin, MD     Vital Signs: BP 138/75 mmHg  Pulse 83  Temp(Src) 98.8 F (37.1 C) (Core (Comment))  Resp 21  Ht 6' 2"$  (1.88 m)  Wt 238 lb 12.1 oz (108.3 kg)  BMI 30.64 kg/m2  SpO2 89%  Physical Exampt awake/answering questions ok; left abd drains intact, insertion sites ok, mild-mod tender; outputs 55/100 cc thick brown/beige fluid; cx's- staph  Imaging: Dg Chest 2 View  03/18/2015  CLINICAL DATA:  Productive cough for 2 days EXAM: CHEST  2 VIEW COMPARISON:  Radiograph 02/25/2015 FINDINGS: The cardiac silhouette is normal. The ascending aorta is ectatic. No effusion, infiltrate or pneumothorax. IMPRESSION: 1. No acute cardiopulmonary findings. 2. Ectatic ascending aorta. Electronically Signed   By: Suzy Bouchard M.D.   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 EXAM: CT ABDOMEN AND PELVIS WITH CONTRAST TECHNIQUE: Multidetector CT imaging of the abdomen and pelvis was performed using the standard protocol following bolus administration of intravenous contrast. CONTRAST:  161m OMNIPAQUE IOHEXOL 300 MG/ML  SOLN COMPARISON:  12/18/2014 FINDINGS: Lower chest: Atelectasis identified at both lung bases. Heart size is normal. No imaged pericardial effusion or significant coronary artery calcifications. Upper abdomen: Interval resection of the distal pancreas and the spleen. The pancreatic head and neck have a normal appearance and enhance normally. There is a fluid collection in the surgical bed, measuring 15.7 x 6.1 cm. A fat fluid level identified within this collection. Small locules of gas  are identified within this collection. There is a thin rim of enhancement surrounding this collection. Within the left upper quadrant, there is a collection of fluid within the mesentery, also containing a fat fluid level. Locules of gas are also identified within this collection which measures 4.1 x 13.8 x 20.7 cm. No definite rim enhancement associated with this collection. No focal abnormality identified within the liver. The spleen is surgically absent. Gallbladder is present. Small left renal cyst is present. No hydronephrosis. Adrenal glands are normal in appearance. Gastrointestinal tract: Moderate hiatal hernia. Stomach is displaced by fluid collections in the left upper quadrant. No evidence for bowel obstruction. Pelvis: Urinary bladder is distended. 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 Retroperitoneum: Atherosclerotic calcification of the abdominal aorta. No significant adenopathy. Abdominal wall: Postoperative changes are identified in the anterior abdominal wall. Fat containing left inguinal hernia. Osseous structures: Unremarkable. IMPRESSION: 1. Interval resection of the pancreatic body and tail ; splenectomy. 2. New fluid collections within the left upper quadrant, likely representing early pseudo cysts given the presence of fat fluid levels. The presence of gas within these collections may indicate recent surgery or developing abscess. 3. Hiatal hernia. 4. Bladder diverticulum. 5. Atherosclerotic calcification of the abdominal aorta. The salient findings were discussed with TODD GERKIN on 03/19/2015 at 3:27 pm. Electronically Signed   By: Nolon Nations M.D.   On: 03/19/2015 15:34   Dg Chest Port 1 View  03/20/2015  CLINICAL DATA:  Patient with sepsis. Prior distal pancreatectomy and splenectomy. EXAM: PORTABLE CHEST 1 VIEW COMPARISON:  Chest radiograph 03/18/2015. FINDINGS: Monitoring leads overlie the patient. Patient is rotated to the right. Low  lung volumes. Enlarged cardiac and mediastinal contours, likely accentuated secondary to portable technique. Heterogeneous opacities left lung base. Possible small left pleural effusion. Pigtail drainage catheters project over the left upper quadrant. IMPRESSION: Heterogeneous opacities left lung base may represent atelectasis, aspiration or infection. Probable small left pleural effusion. Electronically Signed   By: Lovey Newcomer M.D.   On: 03/20/2015 18:57   Ct Image Guided Drainage Percut Cath  Peritoneal Retroperit  03/20/2015  INDICATION: History of distal pancreatectomy and splenectomy, now with postoperative fluid collections worrisome for pancreatic leak and pseudocyst formation. Please perform CT-guided percutaneous drainage catheter placement. EXAM: 1. CT-GUIDED PANCREATIC RESECTION BED PERCUTANEOUS DRAINAGE CATHETER PLACEMENT 2. CT-GUIDED LEFT MID ABDOMINAL PERCUTANEOUS DRAINAGE CATHETER PLACEMENT COMPARISON:  CT abdomen pelvis - 03/19/2015; 11/28/2014 MEDICATIONS: The patient is currently admitted to the hospital and receiving intravenous antibiotics. The antibiotics were administered within an appropriate time frame prior to the initiation of the procedure. ANESTHESIA/SEDATION: Moderate (conscious) sedation was employed during this procedure. A total of Versed 100 mg and Fentanyl 2 mcg was administered intravenously. Moderate Sedation Time: 37 minutes. The patient's level of consciousness and vital signs were monitored continuously by radiology nursing throughout the procedure under my direct supervision. CONTRAST:  None COMPLICATIONS: None immediate. PROCEDURE: Informed written consent was obtained from the patient after a discussion of the risks, benefits and alternatives to treatment. The patient was placed supine, slightly  RPO on the CT gantry and a pre procedural CT was performed re-demonstrating the known abscess/fluid collection within the pancreatic resection bed with dominant component  measuring at least 4.1 x 18.5 cm (image 21, series 2 and an adjacent complex fluid collection within the left mid/ lateral abdomen measuring at least 15.6 x 11 cm (image 43, series 2). The procedure was planned. A timeout was performed prior to the initiation of the procedure. The skin overlying the lateral aspect of the left upper abdominal quadrant was prepped and draped in the usual sterile fashion. The overlying soft tissues were anesthetized with 1% lidocaine with epinephrine. Appropriate trajectory was planned for both fluid collections with the use of a 22 gauge spinal needle. An 18 gauge trocar needles were advanced into the abscess/fluid collection within the pancreatic resection bed an within the mid abdomen and a short Amplatz super stiff wires were coiled within both collections. Appropriate positioning was confirmed with a limited CT scan. The tract was serially dilated allowing placement of a 10 [place] all-purpose drainage catheters at both locations. Appropriate positioning was confirmed with a limited postprocedural CT scan. Approximately 1 L of bilious appearing, slightly blood tinged though non foul smell fluid was aspirated from the pancreatic resection bed catheter. A sample from this collection was capped and sent to the laboratory for analysis. Approximately 0.5 L of bilious appearing, slightly blood tinged though non foul smell fluid was aspirated from the fluid collection within the mid abdomen. A limited post drainage CT scan was obtained. The tubes were connected to a drainage bags and sutured in place. Dressings were placed. The patient tolerated the procedure well without immediate post procedural complication. IMPRESSION: 1. Successful CT guided placement of a 10 [place] all purpose drain catheter into the pancreatic resection bed with aspiration of 1 L of bilious appearing, slightly blood tinged though non foul smell fluid fluid. Samples were sent to the laboratory as requested by the  ordering clinical team. 2. Successful CT guided placement of a 10 [place] all purpose drain catheter into the left mid abdomen with aspiration of 0.5 L of bilious appearing, slightly blood tinged though non foul smell fluid fluid. Electronically Signed   By: Sandi Mariscal M.D.   On: 03/20/2015 11:44   Ct Image Guided Drainage Percut Cath  Peritoneal Retroperit  03/20/2015  INDICATION: History of distal pancreatectomy and splenectomy, now with postoperative fluid collections worrisome for pancreatic leak and pseudocyst formation. Please perform CT-guided percutaneous drainage catheter placement. EXAM: 1. CT-GUIDED PANCREATIC RESECTION BED PERCUTANEOUS DRAINAGE CATHETER PLACEMENT 2. CT-GUIDED LEFT MID ABDOMINAL PERCUTANEOUS DRAINAGE CATHETER PLACEMENT COMPARISON:  CT abdomen pelvis - 03/19/2015; 11/28/2014 MEDICATIONS: The patient is currently admitted to the hospital and receiving intravenous antibiotics. The antibiotics were administered within an appropriate time frame prior to the initiation of the procedure. ANESTHESIA/SEDATION: Moderate (conscious) sedation was employed during this procedure. A total of Versed 100 mg and Fentanyl 2 mcg was administered intravenously. Moderate Sedation Time: 37 minutes. The patient's level of consciousness and vital signs were monitored continuously by radiology nursing throughout the procedure under my direct supervision. CONTRAST:  None COMPLICATIONS: None immediate. PROCEDURE: Informed written consent was obtained from the patient after a discussion of the risks, benefits and alternatives to treatment. The patient was placed supine, slightly RPO on the CT gantry and a pre procedural CT was performed re-demonstrating the known abscess/fluid collection within the pancreatic resection bed with dominant component measuring at least 4.1 x 18.5 cm (image 21, series 2 and an  adjacent complex fluid collection within the left mid/ lateral abdomen measuring at least 15.6 x 11 cm (image  43, series 2). The procedure was planned. A timeout was performed prior to the initiation of the procedure. The skin overlying the lateral aspect of the left upper abdominal quadrant was prepped and draped in the usual sterile fashion. The overlying soft tissues were anesthetized with 1% lidocaine with epinephrine. Appropriate trajectory was planned for both fluid collections with the use of a 22 gauge spinal needle. An 18 gauge trocar needles were advanced into the abscess/fluid collection within the pancreatic resection bed an within the mid abdomen and a short Amplatz super stiff wires were coiled within both collections. Appropriate positioning was confirmed with a limited CT scan. The tract was serially dilated allowing placement of a 10 [place] all-purpose drainage catheters at both locations. Appropriate positioning was confirmed with a limited postprocedural CT scan. Approximately 1 L of bilious appearing, slightly blood tinged though non foul smell fluid was aspirated from the pancreatic resection bed catheter. A sample from this collection was capped and sent to the laboratory for analysis. Approximately 0.5 L of bilious appearing, slightly blood tinged though non foul smell fluid was aspirated from the fluid collection within the mid abdomen. A limited post drainage CT scan was obtained. The tubes were connected to a drainage bags and sutured in place. Dressings were placed. The patient tolerated the procedure well without immediate post procedural complication. IMPRESSION: 1. Successful CT guided placement of a 10 [place] all purpose drain catheter into the pancreatic resection bed with aspiration of 1 L of bilious appearing, slightly blood tinged though non foul smell fluid fluid. Samples were sent to the laboratory as requested by the ordering clinical team. 2. Successful CT guided placement of a 10 [place] all purpose drain catheter into the left mid abdomen with aspiration of 0.5 L of bilious appearing,  slightly blood tinged though non foul smell fluid fluid. Electronically Signed   By: Sandi Mariscal M.D.   On: 03/20/2015 11:44    Labs:  CBC:  Recent Labs  02/25/15 2115 02/26/15 0334 03/18/15 1859 03/21/15 0138  WBC 13.7* 13.2* 13.9* 20.7*  HGB 13.0 12.8* 10.4* 10.3*  HCT 41.7 42.1 33.0* 33.8*  PLT 288 285 956* 892*    COAGS:  Recent Labs  02/19/15 0920 03/18/15 1859  INR 1.07 1.33  APTT 28  --     BMP:  Recent Labs  02/26/15 0334 02/27/15 0340 03/18/15 1859 03/21/15 0138  NA 138 137 132* 129*  K 5.1 4.3 4.7 5.4*  CL 101 103 99* 97*  CO2 23 23 25 23  $ GLUCOSE 143* 257* 243* 161*  BUN 16 23* 17 13  CALCIUM 9.8 9.9 10.4* 10.5*  CREATININE 1.01 1.17 0.85 1.03  GFRNONAA >60 >60 >60 >60  GFRAA >60 >60 >60 >60    LIVER FUNCTION TESTS:  Recent Labs  02/19/15 0920 03/18/15 1859  BILITOT 0.6 0.7  AST 19 34  ALT 23 31  ALKPHOS 83 147*  PROT 6.9 7.1  ALBUMIN 4.2 2.7*    Assessment and Plan: Patient with history of distal pancreatectomy and splenectomy, now with postoperative fluid collections worrisome for pancreatic leak and pseudocyst formation; status post drainage of pancreatic resection bed fluid collection as well as a left mid abdominal fluid collection 03/20/15; afebrile, drain fluid cultures growing staph and  gram-negative rods;  continue with drain irrigation; monitor output closely; check f/u CT later this week   Electronically Signed: D.  Rowe Robert 03/22/2015, 4:54 PM   I spent a total of 15 minutes at the the patient's bedside AND on the patient's hospital floor or unit, greater than 50% of which was counseling/coordinating care for abd fluid collection drainages

## 2015-03-23 LAB — CBC
HCT: 31.5 % — ABNORMAL LOW (ref 39.0–52.0)
Hemoglobin: 9.7 g/dL — ABNORMAL LOW (ref 13.0–17.0)
MCH: 23.7 pg — ABNORMAL LOW (ref 26.0–34.0)
MCHC: 30.8 g/dL (ref 30.0–36.0)
MCV: 76.8 fL — ABNORMAL LOW (ref 78.0–100.0)
Platelets: 898 10*3/uL — ABNORMAL HIGH (ref 150–400)
RBC: 4.1 MIL/uL — ABNORMAL LOW (ref 4.22–5.81)
RDW: 14.8 % (ref 11.5–15.5)
WBC: 15.5 10*3/uL — ABNORMAL HIGH (ref 4.0–10.5)

## 2015-03-23 LAB — CULTURE, ROUTINE-ABSCESS: Special Requests: NORMAL

## 2015-03-23 LAB — COMPREHENSIVE METABOLIC PANEL
ALT: 23 U/L (ref 17–63)
AST: 29 U/L (ref 15–41)
Albumin: 2.2 g/dL — ABNORMAL LOW (ref 3.5–5.0)
Alkaline Phosphatase: 155 U/L — ABNORMAL HIGH (ref 38–126)
Anion gap: 7 (ref 5–15)
BUN: 7 mg/dL (ref 6–20)
CO2: 25 mmol/L (ref 22–32)
Calcium: 9.7 mg/dL (ref 8.9–10.3)
Chloride: 94 mmol/L — ABNORMAL LOW (ref 101–111)
Creatinine, Ser: 0.77 mg/dL (ref 0.61–1.24)
GFR calc Af Amer: >60 mL/min (ref >60)
GFR calc non Af Amer: >60 mL/min (ref >60)
Glucose, Bld: 217 mg/dL — ABNORMAL HIGH (ref 65–99)
Potassium: 4.3 mmol/L (ref 3.5–5.1)
Sodium: 126 mmol/L — ABNORMAL LOW (ref 135–145)
Total Bilirubin: 0.9 mg/dL (ref 0.3–1.2)
Total Protein: 6.5 g/dL (ref 6.5–8.1)

## 2015-03-23 LAB — GLUCOSE, CAPILLARY
Glucose-Capillary: 150 mg/dL — ABNORMAL HIGH (ref 65–99)
Glucose-Capillary: 167 mg/dL — ABNORMAL HIGH (ref 65–99)
Glucose-Capillary: 188 mg/dL — ABNORMAL HIGH (ref 65–99)
Glucose-Capillary: 194 mg/dL — ABNORMAL HIGH (ref 65–99)
Glucose-Capillary: 199 mg/dL — ABNORMAL HIGH (ref 65–99)
Glucose-Capillary: 236 mg/dL — ABNORMAL HIGH (ref 65–99)
Glucose-Capillary: 239 mg/dL — ABNORMAL HIGH (ref 65–99)

## 2015-03-23 LAB — LIPASE, BLOOD: Lipase: 21 U/L (ref 11–51)

## 2015-03-23 MED ORDER — HYDROCODONE-ACETAMINOPHEN 5-325 MG PO TABS
1.0000 | ORAL_TABLET | ORAL | Status: DC | PRN
  Administered 2015-03-23 – 2015-03-24 (×2): 1 via ORAL
  Administered 2015-03-26 – 2015-04-03 (×9): 2 via ORAL
  Filled 2015-03-23 (×4): qty 2
  Filled 2015-03-23: qty 1
  Filled 2015-03-23 (×5): qty 2
  Filled 2015-03-23: qty 1

## 2015-03-23 MED ORDER — CETYLPYRIDINIUM CHLORIDE 0.05 % MT LIQD
7.0000 mL | Freq: Two times a day (BID) | OROMUCOSAL | Status: DC
  Administered 2015-03-24 – 2015-03-27 (×8): 7 mL via OROMUCOSAL

## 2015-03-23 MED ORDER — ACETAMINOPHEN 325 MG PO TABS: 650.0000 mg | ORAL_TABLET | ORAL | Status: DC | PRN

## 2015-03-23 MED ORDER — CHLORHEXIDINE GLUCONATE 0.12 % MT SOLN
15.0000 mL | Freq: Two times a day (BID) | OROMUCOSAL | Status: DC
  Administered 2015-03-24 – 2015-03-27 (×5): 15 mL via OROMUCOSAL
  Filled 2015-03-23 (×5): qty 15

## 2015-03-23 MED ORDER — SODIUM CHLORIDE 0.9 % IV SOLN
INTRAVENOUS | Status: DC
  Administered 2015-03-23 – 2015-04-01 (×11): via INTRAVENOUS

## 2015-03-23 MED ORDER — CHLORHEXIDINE GLUCONATE CLOTH 2 % EX PADS
6.0000 | MEDICATED_PAD | Freq: Every day | CUTANEOUS | Status: AC
  Administered 2015-03-23 – 2015-03-25 (×2): 6 via TOPICAL

## 2015-03-23 NOTE — Progress Notes (Signed)
Patient ID: Derek Mohr Sr., male   DOB: 03-18-1945, 70 y.o.   MRN: [id number]    Referring Physician(s): Gerkin  Chief Complaint: Multiple abdominal fluid collections  Subjective: Pt sleeping, doesn't awaken during my exam.  Wife is present and states he had a rough night  Allergies: Morphine and related; Oxycontin; Quinine; and Voltaren  Medications: Prior to Admission medications   Medication Sig Start Date End Date Taking? Authorizing Provider  carvedilol (COREG) 25 MG tablet Take 25 mg by mouth 2 (two) times daily with a meal.  09/26/11  Yes Historical Provider, MD  [employer] TRIPLE MAGNESIUM COMPLEX PO Take 1-2 capsules by mouth 2 (two) times daily. Two capsules in the morning and one at night   Yes Historical Provider, MD  diphenhydrAMINE (BENADRYL) 25 mg capsule Take 50 mg by mouth 2 (two) times daily. Reported on 03/17/2015   Yes Historical Provider, MD  HYDROmorphone (DILAUDID) 2 MG tablet Take 1 tablet (2 mg total) by mouth every 3 (three) hours as needed for moderate pain or severe pain. 03/03/15  Yes Armandina Gemma, MD  insulin aspart (NOVOLOG) 100 UNIT/ML injection Inject 0-9 Units into the skin 3 (three) times daily before meals. 03/03/15  Yes Armandina Gemma, MD  insulin glargine (LANTUS) 100 UNIT/ML injection Inject 0.2 mLs (20 Units total) into the skin daily. 03/03/15  Yes Armandina Gemma, MD  losartan (COZAAR) 50 MG tablet Take 50 mg by mouth daily. 11/06/14  Yes Historical Provider, MD  naproxen sodium (ALEVE) 220 MG tablet Take 440 mg by mouth 2 (two) times daily with a meal.    Yes Historical Provider, MD  aspirin EC 81 MG tablet Take 81 mg by mouth at bedtime.    Historical Provider, MD  clobetasol ointment (TEMOVATE) [id number] % Apply 1 application topically daily as needed (for peeling skin on hands and feet). Reported on 03/17/2015    Historical Provider, MD  Insulin Syringes, Disposable, U-100 0.5 ML MISC 20 Units by Does not apply route daily. 03/03/15   Armandina Gemma, MD  lansoprazole  (PREVACID) 15 MG capsule Take 30 mg by mouth daily as needed (reflux). Reported on 03/17/2015    Historical Provider, MD  [facility] DELICA LANCETS [id number] MISC 1 Device by Does not apply route 4 (four) times daily. 03/17/15   Renato Shin, MD    Vital Signs: BP 158/85 mmHg  Pulse 81  Temp(Src) 98 F (36.7 C) (Oral)  Resp 24  Ht 6\' 2"  (1.88 m)  Wt 238 lb 12.1 oz (108.3 kg)  BMI 30.64 kg/m2  SpO2 94%  Physical Exam: Abd: soft, both drains intact and sites are c/d/i with no evidence of erythema.  Drain 2 with brown purulent drainage, c/w pancreatic debride (95cc/24hr).  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 EXAM: CT ABDOMEN AND PELVIS WITH CONTRAST TECHNIQUE: Multidetector CT imaging of the abdomen and pelvis was performed using the standard protocol following bolus administration of intravenous contrast. CONTRAST:  14mL OMNIPAQUE IOHEXOL 300 MG/ML  SOLN COMPARISON:  12/18/2014 FINDINGS: Lower chest: Atelectasis identified at both lung bases. Heart size is normal. No imaged pericardial effusion or significant coronary artery calcifications. Upper abdomen: Interval resection of the distal pancreas and the spleen. The pancreatic head and neck have a normal appearance and enhance normally. There is a fluid collection in  the surgical bed, measuring 15.7 x 6.1 cm. A fat fluid level identified within this collection. Small locules of gas are identified within this collection. There is a thin rim of enhancement surrounding this collection. Within the left upper quadrant, there is a collection of fluid within the mesentery, also containing a fat fluid level. Locules of gas are also identified within this collection which measures 4.1 x 13.8 x 20.7 cm. No definite rim enhancement associated with this collection. No  focal abnormality identified within the liver. The spleen is surgically absent. Gallbladder is present. Small left renal cyst is present. No hydronephrosis. Adrenal glands are normal in appearance. Gastrointestinal tract: Moderate hiatal hernia. Stomach is displaced by fluid collections in the left upper quadrant. No evidence for bowel obstruction. Pelvis: Urinary bladder is distended. 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 Retroperitoneum: Atherosclerotic calcification of the abdominal aorta. No significant adenopathy. Abdominal wall: Postoperative changes are identified in the anterior abdominal wall. Fat containing left inguinal hernia. Osseous structures: Unremarkable. IMPRESSION: 1. Interval resection of the pancreatic body and tail ; splenectomy. 2. New fluid collections within the left upper quadrant, likely representing early pseudo cysts given the presence of fat fluid levels. The presence of gas within these collections may indicate recent surgery or developing abscess. 3. Hiatal hernia. 4. Bladder diverticulum. 5. Atherosclerotic calcification of the abdominal aorta. The salient findings were discussed with TODD GERKIN on 03/19/2015 at 3:27 pm. Electronically Signed   By: Nolon Nations M.D.   On: 03/19/2015 15:34   Dg Chest Port 1 View  03/20/2015  CLINICAL DATA:  Patient with sepsis. Prior distal pancreatectomy and splenectomy. EXAM: PORTABLE CHEST 1 VIEW COMPARISON:  Chest radiograph 03/18/2015. FINDINGS: Monitoring leads overlie the patient. Patient is rotated to the right. Low lung volumes. Enlarged cardiac and mediastinal contours, likely accentuated secondary to portable technique. Heterogeneous opacities left lung base. Possible small left pleural effusion. Pigtail drainage catheters project over the left upper quadrant. IMPRESSION: Heterogeneous opacities left lung base may represent atelectasis, aspiration or infection. Probable small left pleural  effusion. Electronically Signed   By: Lovey Newcomer M.D.   On: 03/20/2015 18:57   Ct Image Guided Drainage Percut Cath  Peritoneal Retroperit  03/20/2015  INDICATION: History of distal pancreatectomy and splenectomy, now with postoperative fluid collections worrisome for pancreatic leak and pseudocyst formation. Please perform CT-guided percutaneous drainage catheter placement. EXAM: 1. CT-GUIDED PANCREATIC RESECTION BED PERCUTANEOUS DRAINAGE CATHETER PLACEMENT 2. CT-GUIDED LEFT MID ABDOMINAL PERCUTANEOUS DRAINAGE CATHETER PLACEMENT COMPARISON:  CT abdomen pelvis - 03/19/2015; 11/28/2014 MEDICATIONS: The patient is currently admitted to the hospital and receiving intravenous antibiotics. The antibiotics were administered within an appropriate time frame prior to the initiation of the procedure. ANESTHESIA/SEDATION: Moderate (conscious) sedation was employed during this procedure. A total of Versed 100 mg and Fentanyl 2 mcg was administered intravenously. Moderate Sedation Time: 37 minutes. The patient's level of consciousness and vital signs were monitored continuously by radiology nursing throughout the procedure under my direct supervision. CONTRAST:  None COMPLICATIONS: None immediate. PROCEDURE: Informed written consent was obtained from the patient after a discussion of the risks, benefits and alternatives to treatment. The patient was placed supine, slightly RPO on the CT gantry and a pre procedural CT was performed re-demonstrating the known abscess/fluid collection within the pancreatic resection bed with dominant component measuring at least 4.1 x 18.5 cm (image 21, series 2 and an adjacent complex fluid collection within the left mid/ lateral abdomen measuring at least 15.6  x 11 cm (image 43, series 2). The procedure was planned. A timeout was performed prior to the initiation of the procedure. The skin overlying the lateral aspect of the left upper abdominal quadrant was prepped and draped in the usual  sterile fashion. The overlying soft tissues were anesthetized with 1% lidocaine with epinephrine. Appropriate trajectory was planned for both fluid collections with the use of a 22 gauge spinal needle. An 18 gauge trocar needles were advanced into the abscess/fluid collection within the pancreatic resection bed an within the mid abdomen and a short Amplatz super stiff wires were coiled within both collections. Appropriate positioning was confirmed with a limited CT scan. The tract was serially dilated allowing placement of a 10 [place] all-purpose drainage catheters at both locations. Appropriate positioning was confirmed with a limited postprocedural CT scan. Approximately 1 L of bilious appearing, slightly blood tinged though non foul smell fluid was aspirated from the pancreatic resection bed catheter. A sample from this collection was capped and sent to the laboratory for analysis. Approximately 0.5 L of bilious appearing, slightly blood tinged though non foul smell fluid was aspirated from the fluid collection within the mid abdomen. A limited post drainage CT scan was obtained. The tubes were connected to a drainage bags and sutured in place. Dressings were placed. The patient tolerated the procedure well without immediate post procedural complication. IMPRESSION: 1. Successful CT guided placement of a 10 [place] all purpose drain catheter into the pancreatic resection bed with aspiration of 1 L of bilious appearing, slightly blood tinged though non foul smell fluid fluid. Samples were sent to the laboratory as requested by the ordering clinical team. 2. Successful CT guided placement of a 10 [place] all purpose drain catheter into the left mid abdomen with aspiration of 0.5 L of bilious appearing, slightly blood tinged though non foul smell fluid fluid. Electronically Signed   By: Sandi Mariscal M.D.   On: 03/20/2015 11:44   Ct Image Guided Drainage Percut Cath  Peritoneal Retroperit  03/20/2015  INDICATION:  History of distal pancreatectomy and splenectomy, now with postoperative fluid collections worrisome for pancreatic leak and pseudocyst formation. Please perform CT-guided percutaneous drainage catheter placement. EXAM: 1. CT-GUIDED PANCREATIC RESECTION BED PERCUTANEOUS DRAINAGE CATHETER PLACEMENT 2. CT-GUIDED LEFT MID ABDOMINAL PERCUTANEOUS DRAINAGE CATHETER PLACEMENT COMPARISON:  CT abdomen pelvis - 03/19/2015; 11/28/2014 MEDICATIONS: The patient is currently admitted to the hospital and receiving intravenous antibiotics. The antibiotics were administered within an appropriate time frame prior to the initiation of the procedure. ANESTHESIA/SEDATION: Moderate (conscious) sedation was employed during this procedure. A total of Versed 100 mg and Fentanyl 2 mcg was administered intravenously. Moderate Sedation Time: 37 minutes. The patient's level of consciousness and vital signs were monitored continuously by radiology nursing throughout the procedure under my direct supervision. CONTRAST:  None COMPLICATIONS: None immediate. PROCEDURE: Informed written consent was obtained from the patient after a discussion of the risks, benefits and alternatives to treatment. The patient was placed supine, slightly RPO on the CT gantry and a pre procedural CT was performed re-demonstrating the known abscess/fluid collection within the pancreatic resection bed with dominant component measuring at least 4.1 x 18.5 cm (image 21, series 2 and an adjacent complex fluid collection within the left mid/ lateral abdomen measuring at least 15.6 x 11 cm (image 43, series 2). The procedure was planned. A timeout was performed prior to the initiation of the procedure. The skin overlying the lateral aspect of the left upper abdominal quadrant was prepped and draped  in the usual sterile fashion. The overlying soft tissues were anesthetized with 1% lidocaine with epinephrine. Appropriate trajectory was planned for both fluid collections with the  use of a 22 gauge spinal needle. An 18 gauge trocar needles were advanced into the abscess/fluid collection within the pancreatic resection bed an within the mid abdomen and a short Amplatz super stiff wires were coiled within both collections. Appropriate positioning was confirmed with a limited CT scan. The tract was serially dilated allowing placement of a 10 [place] all-purpose drainage catheters at both locations. Appropriate positioning was confirmed with a limited postprocedural CT scan. Approximately 1 L of bilious appearing, slightly blood tinged though non foul smell fluid was aspirated from the pancreatic resection bed catheter. A sample from this collection was capped and sent to the laboratory for analysis. Approximately 0.5 L of bilious appearing, slightly blood tinged though non foul smell fluid was aspirated from the fluid collection within the mid abdomen. A limited post drainage CT scan was obtained. The tubes were connected to a drainage bags and sutured in place. Dressings were placed. The patient tolerated the procedure well without immediate post procedural complication. IMPRESSION: 1. Successful CT guided placement of a 10 [place] all purpose drain catheter into the pancreatic resection bed with aspiration of 1 L of bilious appearing, slightly blood tinged though non foul smell fluid fluid. Samples were sent to the laboratory as requested by the ordering clinical team. 2. Successful CT guided placement of a 10 [place] all purpose drain catheter into the left mid abdomen with aspiration of 0.5 L of bilious appearing, slightly blood tinged though non foul smell fluid fluid. Electronically Signed   By: Sandi Mariscal M.D.   On: 03/20/2015 11:44    Labs:  CBC:  Recent Labs  02/26/15 0334 03/18/15 1859 03/21/15 0138 03/23/15 0340  WBC 13.2* 13.9* 20.7* 15.5*  HGB 12.8* 10.4* 10.3* 9.7*  HCT 42.1 33.0* 33.8* 31.5*  PLT 285 956* 892* 898*    COAGS:  Recent Labs  02/19/15 0920  03/18/15 1859  INR 1.07 1.33  APTT 28  --     BMP:  Recent Labs  02/27/15 0340 03/18/15 1859 03/21/15 0138 03/22/15 2145 03/23/15 0340  NA 137 132* 129*  --  126*  K 4.3 4.7 5.4* 4.6 4.3  CL 103 99* 97*  --  94*  CO2 23 25 23   --  25  GLUCOSE 257* 243* 161*  --  217*  BUN 23* 17 13  --  7  CALCIUM 9.9 10.4* 10.5*  --  9.7  CREATININE 1.17 0.85 1.03  --  0.77  GFRNONAA >60 >60 >60  --  >60  GFRAA >60 >60 >60  --  >60    LIVER FUNCTION TESTS:  Recent Labs  02/19/15 0920 03/18/15 1859 03/23/15 0340  BILITOT 0.6 0.7 0.9  AST 19 34 29  ALT 23 31 23   ALKPHOS 83 147* 155*  PROT 6.9 7.1 6.5  ALBUMIN 4.2 2.7* 2.2*    Assessment and Plan: S/pdistal pancreatectomy and splenectomy, now with postoperative fluid collections worrisome for pancreatic leak and pseudocyst formation -s/p drain placement x2 by Dr. Annamaria Boots on 1/28 -cont both drains for now with irrigations -Cx growing Staph today, this is final -Drain 2 concerning for pancreatic leak  -repeat CT later this week vs WE.  Electronically Signed: Henreitta Cea 03/23/2015, 11:32 AM   I spent a total of 15 Minutes at the the patient's bedside AND on the patient's hospital floor or  unit, greater than 50% of which was counseling/coordinating care for intra-abdominal fluid collections, s/p perc drain x2

## 2015-03-23 NOTE — Consult Note (Signed)
   [facility] CM Inpatient Consult   03/23/2015  [name] [birth year] [id number]   Went to bedside to speak with patient and wife. Wife not at bedside. Came by to obtain written consent for [facility] Care Management services. However, patient's wife not at bedside currently. Spoke with Mrs. Hyacinthe last week regarding [place] follow up. Will come back at later time.  Marthenia Rolling, MSN-Ed, RN,BSN [facility] Liaison [phone]

## 2015-03-23 NOTE — Progress Notes (Signed)
Patient ID: [name], male   DOB: February 07, 1946, 70 y.o.   MRN: [id number]  [facility]  Subjective: Patient sleeping comfortably.  Wife at bedside.  Up to bedside commode today.  Voiding.  Tolerating clear liquid diet.  Objective: Vital signs in last 24 hours: Temp:  [98 F (36.7 C)-98.4 F (36.9 C)] 98 F (36.7 C) (01/31 0800) Pulse Rate:  [75-93] 88 (01/31 0400) Resp:  [17-28] 26 (01/31 0400) BP: (130-183)/(69-109) 158/85 mmHg (01/31 0400) SpO2:  [89 %-96 %] 91 % (01/31 0400) Last BM Date: 03/22/15  Intake/Output from previous day: 01/30 0701 - 01/31 0700 In: 2936.7 [P.O.:480; I.V.:1876.7; IV Piggyback:500] Out: 2528 [Urine:2250; Drains:278] Intake/Output this shift:    Physical Exam: HEENT - sclerae clear, mucous membranes moist Neck - soft Abdomen - soft, non-tender; drains with purulent fluid in one, brownish thin fluid in other Ext - no edema, non-tender  Lab Results:   Recent Labs  03/21/15 0138 03/23/15 0340  WBC 20.7* 15.5*  HGB 10.3* 9.7*  HCT 33.8* 31.5*  PLT 892* 898*   BMET  Recent Labs  03/21/15 0138 03/22/15 2145 03/23/15 0340  NA 129*  --  126*  K 5.4* 4.6 4.3  CL 97*  --  94*  CO2 23  --  25  GLUCOSE 161*  --  217*  BUN 13  --  7  CREATININE 1.03  --  0.77  CALCIUM 10.5*  --  9.7   PT/INR No results for input(s): LABPROT, INR in the last 72 hours. Comprehensive Metabolic Panel:    Component Value Date/Time   NA 126* 03/23/2015 0340   NA 129* 03/21/2015 0138   K 4.3 03/23/2015 0340   K 4.6 03/22/2015 2145   CL 94* 03/23/2015 0340   CL 97* 03/21/2015 0138   CO2 25 03/23/2015 0340   CO2 23 03/21/2015 0138   BUN 7 03/23/2015 0340   BUN 13 03/21/2015 0138   CREATININE 0.77 03/23/2015 0340   CREATININE 1.03 03/21/2015 0138   GLUCOSE 217* 03/23/2015 0340   GLUCOSE 161* 03/21/2015 0138   CALCIUM 9.7 03/23/2015 0340   CALCIUM 10.5* 03/21/2015 0138   AST 29 03/23/2015 0340   AST 34  03/18/2015 1859   ALT 23 03/23/2015 0340   ALT 31 03/18/2015 1859   ALKPHOS 155* 03/23/2015 0340   ALKPHOS 147* 03/18/2015 1859   BILITOT 0.9 03/23/2015 0340   BILITOT 0.7 03/18/2015 1859   PROT 6.5 03/23/2015 0340   PROT 7.1 03/18/2015 1859   ALBUMIN 2.2* 03/23/2015 0340   ALBUMIN 2.7* 03/18/2015 1859    Studies/Results: No results found.  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 Discontinue Dilaudid - hydrocodone for back pain if needed. Transfer to floor today. Physical therapy consult for ambulation, strengthening, mobility  [name], MD, [facility] Office: [place] 03/23/2015

## 2015-03-24 LAB — BASIC METABOLIC PANEL
Anion gap: 10 (ref 5–15)
BUN: 7 mg/dL (ref 6–20)
CO2: 27 mmol/L (ref 22–32)
Calcium: 10.4 mg/dL — ABNORMAL HIGH (ref 8.9–10.3)
Chloride: 95 mmol/L — ABNORMAL LOW (ref 101–111)
Creatinine, Ser: 0.73 mg/dL (ref 0.61–1.24)
GFR calc Af Amer: >60 mL/min (ref >60)
GFR calc non Af Amer: >60 mL/min (ref >60)
Glucose, Bld: 125 mg/dL — ABNORMAL HIGH (ref 65–99)
Potassium: 4.2 mmol/L (ref 3.5–5.1)
Sodium: 132 mmol/L — ABNORMAL LOW (ref 135–145)

## 2015-03-24 LAB — GLUCOSE, CAPILLARY
Glucose-Capillary: 119 mg/dL — ABNORMAL HIGH (ref 65–99)
Glucose-Capillary: 154 mg/dL — ABNORMAL HIGH (ref 65–99)
Glucose-Capillary: 229 mg/dL — ABNORMAL HIGH (ref 65–99)
Glucose-Capillary: 201 mg/dL — ABNORMAL HIGH (ref 65–99)
Glucose-Capillary: 215 mg/dL — ABNORMAL HIGH (ref 65–99)

## 2015-03-24 LAB — CBC
HCT: 33.2 % — ABNORMAL LOW (ref 39.0–52.0)
Hemoglobin: 10.1 g/dL — ABNORMAL LOW (ref 13.0–17.0)
MCH: 23.5 pg — ABNORMAL LOW (ref 26.0–34.0)
MCHC: 30.4 g/dL (ref 30.0–36.0)
MCV: 77.2 fL — ABNORMAL LOW (ref 78.0–100.0)
Platelets: 992 10*3/uL (ref 150–400)
RBC: 4.3 MIL/uL (ref 4.22–5.81)
RDW: 15.2 % (ref 11.5–15.5)
WBC: 15 10*3/uL — ABNORMAL HIGH (ref 4.0–10.5)

## 2015-03-24 MED ORDER — GLUCERNA SHAKE PO LIQD
237.0000 mL | Freq: Three times a day (TID) | ORAL | Status: DC
  Administered 2015-03-25 – 2015-04-02 (×9): 237 mL via ORAL
  Filled 2015-03-24 (×34): qty 237

## 2015-03-24 MED ORDER — HEPARIN SODIUM (PORCINE) 5000 UNIT/ML IJ SOLN: 5000.0000 [IU] | Freq: Three times a day (TID) | INTRAMUSCULAR | Status: DC

## 2015-03-24 MED ORDER — ASPIRIN 325 MG PO TABS
325.0000 mg | ORAL_TABLET | Freq: Every day | ORAL | Status: DC
  Administered 2015-03-24 – 2015-04-04 (×12): 325 mg via ORAL
  Filled 2015-03-24 (×12): qty 1

## 2015-03-24 MED ORDER — HEPARIN SODIUM (PORCINE) 5000 UNIT/ML IJ SOLN
5000.0000 [IU] | Freq: Three times a day (TID) | INTRAMUSCULAR | Status: DC
  Administered 2015-03-24 – 2015-03-30 (×20): 5000 [IU] via SUBCUTANEOUS
  Filled 2015-03-24 (×20): qty 1

## 2015-03-24 NOTE — Progress Notes (Signed)
Patient ID: Derek Mohr Sr., male   DOB: June 07, 1945, 71 y.o.   MRN: [id number]  [facility]  Subjective: Patient somnolent, awakes to stimulation.  Appropriate answers.  Tired.  No nausea or emesis.  Mild LUQ pain.  Objective: Vital signs in last 24 hours: Temp:  [98 F (36.7 C)-100.1 F (37.8 C)] 99 F (37.2 C) (02/01 0400) Pulse Rate:  [81-103] 85 (02/01 0000) Resp:  [21-28] 21 (02/01 0000) BP: (129-162)/(64-75) 129/75 mmHg (02/01 0000) SpO2:  [89 %-97 %] 94 % (02/01 0000) Last BM Date: 03/22/15  Intake/Output from previous day: 01/31 0701 - 02/01 0700 In: 2000 [I.V.:1440; IV Piggyback:550] Out: 825 [Urine:650; Drains:175] Intake/Output this shift:    Physical Exam: HEENT - sclerae clear, mucous membranes moist Neck - soft Chest - clear bilaterally Cor - RRR Abdomen - soft without distension; drains with decreased output Ext - no edema, non-tender  Lab Results:   Recent Labs  03/23/15 0340 03/24/15 0330  WBC 15.5* 15.0*  HGB 9.7* 10.1*  HCT 31.5* 33.2*  PLT 898* 992*   BMET  Recent Labs  03/23/15 0340 03/24/15 0330  NA 126* 132*  K 4.3 4.2  CL 94* 95*  CO2 25 27  GLUCOSE 217* 125*  BUN 7 7  CREATININE 0.77 0.73  CALCIUM 9.7 10.4*   PT/INR No results for input(s): LABPROT, INR in the last 72 hours. Comprehensive Metabolic Panel:    Component Value Date/Time   NA 132* 03/24/2015 0330   NA 126* 03/23/2015 0340   K 4.2 03/24/2015 0330   K 4.3 03/23/2015 0340   CL 95* 03/24/2015 0330   CL 94* 03/23/2015 0340   CO2 27 03/24/2015 0330   CO2 25 03/23/2015 0340   BUN 7 03/24/2015 0330   BUN 7 03/23/2015 0340   CREATININE 0.73 03/24/2015 0330   CREATININE 0.77 03/23/2015 0340   GLUCOSE 125* 03/24/2015 0330   GLUCOSE 217* 03/23/2015 0340   CALCIUM 10.4* 03/24/2015 0330   CALCIUM 9.7 03/23/2015 0340   AST 29 03/23/2015 0340   AST 34 03/18/2015 1859   ALT 23 03/23/2015 0340   ALT 31 03/18/2015 1859   ALKPHOS 155* 03/23/2015 0340   ALKPHOS 147* 03/18/2015 1859   BILITOT 0.9 03/23/2015 0340   BILITOT 0.7 03/18/2015 1859   PROT 6.5 03/23/2015 0340   PROT 7.1 03/18/2015 1859   ALBUMIN 2.2* 03/23/2015 0340   ALBUMIN 2.7* 03/18/2015 1859    Studies/Results: No results found.  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 Office: [place] 03/24/2015

## 2015-03-24 NOTE — Progress Notes (Signed)
Received pt from ICU, telemetry applied, oriented to unit, call light placed with in reach

## 2015-03-24 NOTE — Progress Notes (Signed)
Nutrition Follow-up  DOCUMENTATION CODES:   Severe malnutrition in context of chronic illness, Obesity unspecified  INTERVENTION:  - Will order Glucerna Shake po TID, each supplement provides 220 kcal and 10 grams of protein - Continued diet advancement as medically feasible - RD will continue to monitor for needs   NUTRITION DIAGNOSIS:   Malnutrition related to chronic illness, cancer and cancer related treatments as evidenced by percent weight loss, energy intake < or equal to 75% for > or equal to 1 month. 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 Initially did well after discharge home. Seen by endocrinologist for management of IDDM. Developed progressive LUQ abd pain, nausea, emesis, and worsening back pain. Presented to [place] office yesterday and admitted for further work up and treatment.  2/1 Per chart review, diet advancement as follows: 1/28 @ 0001: NPO 1/29 @ [id number]: CLD 1/31 @ 1030: FLD  No intakes documented with diet advancement. Pt with breakfast tray (grits with butter and vanilla pudding) on bedside table and pt reports poor appetite and inability to eat at this time as he is not feeling well. He denies pain or nausea at this time but states that he has feeling of general unwell. Pt attempting to use the urinal at time of RD visit and physical unable to be done at this time. Will order supplement per previous RD. Not meeting needs. Medications reviewed. Labs reviewed; CBGs: 119-240 mg/dL, Na: 132 mmol/L, Cl: 95 mmol/L, Ca: 10.4 mg/dL.    1/29 - Pt in room asleep with wife at bedside.  - Per pt's wife, pt has had extremely poor appetite and PO intake over the past month and has worsened since  distal pancreatectomy 3 weeks ago. - Last time pt consumed solid food was last Wednesday 1/25 which was a few bites of a waffle.  - She states pt would consume 4-5 bites of food prior to surgery and would get full very quickly.  - Pt currently on clear liquids and only consuming ice chips.  - Pt has lost 25-30 lb per wife, weight history shows a 25 lb weight loss since 12/30 (9% wt loss x 1 month, significant for time frame). - When diet is advanced, RD recommends nutritional supplementation such as Glucerna shakes. - Pt asleep and unable to perform full exam. Noticeable depletion in clavicle regions.  Diet Order:  Diet full liquid Room service appropriate?: Yes; Fluid consistency:: Thin  Skin:  Wound (see comment) (abdominal incision 1/05)  Last BM:  1/30  Height:   Ht Readings from Last 1 Encounters:  03/20/15 '6\' 2"'$  (1.88 m)    Weight:   Wt Readings from Last 1 Encounters:  03/20/15 238 lb 12.1 oz (108.3 kg)    Ideal Body Weight:  86.4 kg  BMI:  Body mass index is 30.64 kg/(m^2).  Estimated Nutritional Needs:   Kcal:  [id number]  Protein:  120-130g  Fluid:  2.3-2.5 L/day  EDUCATION NEEDS:   No education needs identified at this time     Jarome Matin, RD, LDN Inpatient Clinical Dietitian Pager # [phone] After hours/weekend pager # [phone]

## 2015-03-24 NOTE — Evaluation (Addendum)
Physical Therapy Evaluation Patient Details Name: Derek HIERS Sr. MRN: [id number] DOB: [birth year] Today's Date: 03/24/2015   History of Present Illness  70 y.o. male who is s/p pancreatectomy 2* cancer now readmitted with abscess. H/o B TKA, HTN, DM, Meniere's disease (with falls), obesity, anxiety  Clinical Impression  Pt admitted with above diagnosis. Pt currently with functional limitations due to the deficits listed below (see PT Problem List). Pt ambulated 10' with RW and min A ( +2 assist for safety due to h/o falls 2* Menieres). He has generalized weakness and deconditioning. May need ST-SNF depending of progress.  Pt will benefit from skilled PT to increase their independence and safety with mobility to allow discharge to the venue listed below.       Follow Up Recommendations SNF (assist for mobility)    Equipment Recommendations  Wheelchair (measurements PT) (possibly WC if progress is limited)    Recommendations for Other Services OT consult     Precautions / Restrictions Precautions Precautions: Fall;Other (comment) Precaution Comments: 2 drains L trunk; h/o falls 2* Menieres (pt can't recall when most recent fall occurred) Restrictions Weight Bearing Restrictions: No      Mobility  Bed Mobility Overal bed mobility: Needs Assistance Bed Mobility: Supine to Sit     Supine to sit: Mod assist     General bed mobility comments: HOB up 60*, verbal cues for technique, used pad to scoot to EOB, MOd A to raise trunk  Transfers Overall transfer level: Needs assistance Equipment used: Rolling walker (2 wheeled) Transfers: Sit to/from Stand Sit to Stand: +2 safety/equipment;From elevated surface;Min assist         General transfer comment: verbal cues for hand placement  Ambulation/Gait Ambulation/Gait assistance: +2 safety/equipment;Min guard Ambulation Distance (Feet): 10 Feet Assistive device: Rolling walker (2 wheeled) Gait Pattern/deviations: Step-to  pattern;Decreased step length - right;Decreased step length - left;Trunk flexed   Gait velocity interpretation: Below normal speed for age/gender General Gait Details: slow gait speed. VCs for posture and to incr step length, fatigue limited distance, pt stated, "I've never felt this weak in my life."  Stairs            Wheelchair Mobility    Modified Rankin (Stroke Patients Only)       Balance Overall balance assessment: Needs assistance   Sitting balance-Leahy Scale: Good     Standing balance support: Bilateral upper extremity supported Standing balance-Leahy Scale: Poor Standing balance comment: relies on BUE support                             Pertinent Vitals/Pain Pain Assessment: 0-10 Pain Score: 9  Pain Location: chronic back pain 2* DDD Pain Descriptors / Indicators: Sore Pain Intervention(s): Limited activity within patient's tolerance;Monitored during session;Premedicated before session    Home Living Family/patient expects to be discharged to:: Private residence Living Arrangements: Spouse/significant other Available Help at Discharge: Family;Available PRN/intermittently Type of Home: House Home Access: Stairs to enter Entrance Stairs-Rails: Can reach both;Left;Right Entrance Stairs-Number of Steps: 4 Home Layout: Two level;Able to live on main level with bedroom/bathroom Home Equipment: Gilford Rile - 2 wheels;Cane - single point;Bedside commode (cbg meter) Additional Comments: wife works but will retire soon    Prior Function Level of Independence: Independent               Hand Dominance        Extremity/Trunk Assessment   Upper Extremity Assessment: Generalized weakness (pt reports  B rotator cuff tears)           Lower Extremity Assessment: Generalized weakness (B knee extension 4/5, ankles WNL)      Cervical / Trunk Assessment: Normal  Communication   Communication: HOH (deaf R ear)  Cognition Arousal/Alertness:  Awake/alert Behavior During Therapy: WFL for tasks assessed/performed Overall Cognitive Status: Within Functional Limits for tasks assessed                      General Comments      Exercises General Exercises - Lower Extremity Ankle Circles/Pumps: AROM;10 reps;Both;Seated      Assessment/Plan    PT Assessment Patient needs continued PT services  PT Diagnosis Difficulty walking;Generalized weakness;Acute pain   PT Problem List Decreased strength;Decreased activity tolerance;Decreased balance;Pain;Decreased knowledge of use of DME;Decreased mobility;Obesity  PT Treatment Interventions DME instruction;Gait training;Stair training;Functional mobility training;Therapeutic activities;Patient/family education;Balance training;Therapeutic exercise   PT Goals (Current goals can be found in the Care Plan section) Acute Rehab PT Goals Patient Stated Goal: "to get my strength back and ride my motorcycle, it's my favorite thing in the world" PT Goal Formulation: With patient Time For Goal Achievement: 04/07/15 Potential to Achieve Goals: Good    Frequency Min 3X/week   Barriers to discharge Decreased caregiver support wife works during day but is planning to retire soon    Co-evaluation               End of Session Equipment Utilized During Treatment: Gait belt Activity Tolerance: Patient limited by fatigue Patient left: in chair;with call bell/phone within reach Nurse Communication: Mobility status         Time: 1415-1447 PT Time Calculation (min) (ACUTE ONLY): 32 min   Charges:   PT Evaluation $PT Eval Moderate Complexity: 1 Procedure  Gait training: 1     PT G Codes:        Philomena Doheny 03/24/2015, 3:06 PM [phone]

## 2015-03-24 NOTE — Progress Notes (Signed)
Patient ID: Derek Mohr Sr., male   DOB: [birth year], 70 y.o.   MRN: [id number]    Referring Physician(s): Gerkin   Chief Complaint: Multiple abdominal fluid collections  Subjective: Pt sleeping.  Awakens some.  No c/o pain  Allergies: Morphine and related; Oxycontin; Quinine; and Voltaren  Medications: Prior to Admission medications   Medication Sig Start Date End Date Taking? Authorizing Provider  carvedilol (COREG) 25 MG tablet Take 25 mg by mouth 2 (two) times daily with a meal.  09/26/11  Yes Historical Provider, MD  [employer] TRIPLE MAGNESIUM COMPLEX PO Take 1-2 capsules by mouth 2 (two) times daily. Two capsules in the morning and one at night   Yes Historical Provider, MD  diphenhydrAMINE (BENADRYL) 25 mg capsule Take 50 mg by mouth 2 (two) times daily. Reported on 03/17/2015   Yes Historical Provider, MD  HYDROmorphone (DILAUDID) 2 MG tablet Take 1 tablet (2 mg total) by mouth every 3 (three) hours as needed for moderate pain or severe pain. 03/03/15  Yes Armandina Gemma, MD  insulin aspart (NOVOLOG) 100 UNIT/ML injection Inject 0-9 Units into the skin 3 (three) times daily before meals. 03/03/15  Yes Armandina Gemma, MD  insulin glargine (LANTUS) 100 UNIT/ML injection Inject 0.2 mLs (20 Units total) into the skin daily. 03/03/15  Yes Armandina Gemma, MD  losartan (COZAAR) 50 MG tablet Take 50 mg by mouth daily. 11/06/14  Yes Historical Provider, MD  naproxen sodium (ALEVE) 220 MG tablet Take 440 mg by mouth 2 (two) times daily with a meal.    Yes Historical Provider, MD  aspirin EC 81 MG tablet Take 81 mg by mouth at bedtime.    Historical Provider, MD  clobetasol ointment (TEMOVATE) [id number] % Apply 1 application topically daily as needed (for peeling skin on hands and feet). Reported on 03/17/2015    Historical Provider, MD  Insulin Syringes, Disposable, U-100 0.5 ML MISC 20 Units by Does not apply route daily. 03/03/15   Armandina Gemma, MD  lansoprazole (PREVACID) 15 MG capsule Take 30 mg by mouth daily as  needed (reflux). Reported on 03/17/2015    Historical Provider, MD  [facility] DELICA LANCETS [id number] MISC 1 Device by Does not apply route 4 (four) times daily. 03/17/15   Renato Shin, MD    Vital Signs: BP 136/70 mmHg  Pulse 93  Temp(Src) 99 F (37.2 C) (Oral)  Resp 25  Ht 6\' 2"  (1.88 m)  Wt 238 lb 12.1 oz (108.3 kg)  BMI 30.64 kg/m2  SpO2 97%  Physical Exam: Abd: soft, nontender, drain #1 - 70cc of tan purulent fluid.  Drain 2 105cc of brown purulent drainage c/w pancreatic debride.  Sites are c/d/i  Imaging: Dg Chest Port 1 View  03/20/2015  CLINICAL DATA:  Patient with sepsis. Prior distal pancreatectomy and splenectomy. EXAM: PORTABLE CHEST 1 VIEW COMPARISON:  Chest radiograph 03/18/2015. FINDINGS: Monitoring leads overlie the patient. Patient is rotated to the right. Low lung volumes. Enlarged cardiac and mediastinal contours, likely accentuated secondary to portable technique. Heterogeneous opacities left lung base. Possible small left pleural effusion. Pigtail drainage catheters project over the left upper quadrant. IMPRESSION: Heterogeneous opacities left lung base may represent atelectasis, aspiration or infection. Probable small left pleural effusion. Electronically Signed   By: Lovey Newcomer M.D.   On: 03/20/2015 18:57    Labs:  CBC:  Recent Labs  03/18/15 1859 03/21/15 0138 03/23/15 0340 03/24/15 0330  WBC 13.9* 20.7* 15.5* 15.0*  HGB 10.4* 10.3* 9.7* 10.1*  HCT 33.0*  33.8* 31.5* 33.2*  PLT 956* 892* 898* 992*    COAGS:  Recent Labs  02/19/15 0920 03/18/15 1859  INR 1.07 1.33  APTT 28  --     BMP:  Recent Labs  03/18/15 1859 03/21/15 0138 03/22/15 2145 03/23/15 0340 03/24/15 0330  NA 132* 129*  --  126* 132*  K 4.7 5.4* 4.6 4.3 4.2  CL 99* 97*  --  94* 95*  CO2 25 23  --  25 27  GLUCOSE 243* 161*  --  217* 125*  BUN 17 13  --  7 7  CALCIUM 10.4* 10.5*  --  9.7 10.4*  CREATININE 0.85 1.03  --  0.77 0.73  GFRNONAA >60 >60  --  >60 >60  GFRAA >60  >60  --  >60 >60    LIVER FUNCTION TESTS:  Recent Labs  02/19/15 0920 03/18/15 1859 03/23/15 0340  BILITOT 0.6 0.7 0.9  AST 19 34 29  ALT 23 31 23   ALKPHOS 83 147* 155*  PROT 6.9 7.1 6.5  ALBUMIN 4.2 2.7* 2.2*    Assessment and Plan: S/pdistal pancreatectomy and splenectomy, now with postoperative fluid collections worrisome for pancreatic leak and pseudocyst formation -s/p drain placement x2 by Dr. Annamaria Boots on 1/28 -cont both drains for now with irrigations -Cx growing Staph  -Drain 2 concerning for pancreatic leak  -repeat CT later this week vs WE  Electronically Signed: Maisley Hainsworth E 03/24/2015, 11:18 AM   I spent a total of 15 Minutes at the the patient's bedside AND on the patient's hospital floor or unit, greater than 50% of which was counseling/coordinating care for intra-abdominal abscesses, s/p perc drains

## 2015-03-24 NOTE — Care Management Important Message (Addendum)
Important Message  Patient Details IM Letter given to Cookie/Case Manager to present to Patient Name: Derek STARN Sr. MRN: [id number] Date of Birth: Apr 09, 1945   Medicare Important Message Given:  Yes    [name] 03/24/2015, [place] Message  Patient Details  Name: Derek DELANEY Sr. MRN: [id number] Date of Birth: 1945/09/04   Medicare Important Message Given:  Yes    [name] 03/24/2015, 11:22 AM

## 2015-03-24 NOTE — Progress Notes (Signed)
CRITICAL VALUE ALERT  Critical value received:  Platelets 992  Date of notification:  03/24/2015  Time of notification:  [id number]  Critical value read back:Yes.    Nurse who received alert:  Orlie Pollen  MD notified (1st page): Alphonsa Overall  Time of first page:  [phone]  MD notified (2nd page):  Time of second page:  Responding MD:  Alphonsa Overall  Time MD responded:  [phone]

## 2015-03-25 LAB — GLUCOSE, CAPILLARY
Glucose-Capillary: 186 mg/dL — ABNORMAL HIGH (ref 65–99)
Glucose-Capillary: 160 mg/dL — ABNORMAL HIGH (ref 65–99)
Glucose-Capillary: 146 mg/dL — ABNORMAL HIGH (ref 65–99)
Glucose-Capillary: 179 mg/dL — ABNORMAL HIGH (ref 65–99)
Glucose-Capillary: 161 mg/dL — ABNORMAL HIGH (ref 65–99)

## 2015-03-25 NOTE — Progress Notes (Signed)
CSW received referral for New SNF.   CSW met with pt wife at bedside. Pt wife expressed frustration because pt has not been out of bed. Pt wife expressed that her husband if very full of life at baseline and she does not feel that pt has quality of life at this time. Pt wife shared that she spoke with MD about concerns and MD was going to speak with PT about pt having PT treatment more often.  CSW discussed recommendation for rehab upon discharge and pt wife is agreeable. Pt wife discussed that pt is not near ready for discharge, but pt wife agreeable to CSW starting process of SNF search.  CSW to complete FL2 and initiate SNF search to [facility].  CSW provided supportive counseling to pt wife and pt wife expressed feeling better being able to speak to someone about her concerns.   CSW to follow up with pt wife re: disposition planning.  Full psychosocial assessment to follow.  CSW to continue to follow to provide support and assist with pt disposition needs.   Alison Murray, MSW, [place] Work [phone]

## 2015-03-25 NOTE — Progress Notes (Signed)
Pharmacy Antibiotic Note  Derek Frink. is a 70 y.o. male admitted on 03/18/2015 on day 6 vancomycin and day 7 ertapenem for abscesses/fluid collections following distal pancreatectomy/splenectomy  Plan: This patient's current antibiotics will be continued without adjustments.    Temp (24hrs), Avg:98.7 F (37.1 C), Min:98.4 F (36.9 C), Max:99 F (37.2 C)   Recent Labs Lab 03/18/15 1859 03/20/15 2206 03/21/15 0138 03/22/15 2145 03/23/15 0340 03/24/15 0330  WBC 13.9*  --  20.7*  --  15.5* 15.0*  CREATININE 0.85  --  1.03  --  0.77 0.73  LATICACIDVEN  --  0.9  --   --   --   --   VANCOTROUGH  --   --   --  10  --   --     Estimated Creatinine Clearance: 112.5 mL/min (by C-G formula based on Cr of 0.73).    Allergies  Allergen Reactions  . Morphine And Related Nausea And Vomiting  . Oxycontin [Oxycodone Hcl] Swelling    Lips swells. Tolerates immediate-release oxycodone as well as hydrocodone.  . Quinine Other (See Comments)    Platelets dropped  . Voltaren [Diclofenac Sodium] Other (See Comments)    Elevated liver enzymes   Antimicrobials this admission: 1/28 vanc >> 1/27 ertapenem (MD)>> 1/29 diflucan (MD) >> 1/31  Dose adjustments this admission: 1/30: 2130 VT = 10 on 750 q12h, increase to 1g q12h  Microbiology results: 1/28 MRSA PCR (+) 1/28 abscess cx: abundant CoNS, final 1/28 blood x 2: ngtd x 3 days  Thank you for allowing pharmacy to be a part of this patient's care.  Hershal Coria, PharmD, BCPS Pager: [phone] 03/25/2015 12:03 PM

## 2015-03-25 NOTE — Progress Notes (Signed)
Patient ID: Derek Mohr Sr., male   DOB: 1946/02/11, 70 y.o.   MRN: [id number]  [name]  Subjective: Patient on [place] ward.  More alert and interactive today.  Mild LUQ pain.  Wants to get up more out of bed.  Tolerating full liquid diet well.  Objective: Vital signs in last 24 hours: Temp:  [98.4 F (36.9 C)-99 F (37.2 C)] 98.4 F (36.9 C) (02/02 0442) Pulse Rate:  [93-96] 93 (02/02 0442) Resp:  [20-22] 22 (02/02 0442) BP: (121-160)/(67-79) 160/79 mmHg (02/02 0442) SpO2:  [90 %-94 %] 92 % (02/02 0442) Last BM Date: 03/22/15  Intake/Output from previous day: 02/01 0701 - 02/02 0700 In: 2070 [P.O.:600; I.V.:1200; IV Piggyback:250] Out: 730 [Urine:650; Drains:80] Intake/Output this shift: Total I/O In: 120 [P.O.:120] Out: 1 [Stool:1]  Physical Exam: HEENT - sclerae clear, mucous membranes moist Neck - soft Chest - clear bilaterally Cor - RRR Abdomen - soft, obese; mild tender LUQ; drain sites clear and dry; moderate creamy fluid in one drain, small brownish thin fluid in other Neuro - alert & oriented, no focal deficits  Lab Results:   Recent Labs  03/23/15 0340 03/24/15 0330  WBC 15.5* 15.0*  HGB 9.7* 10.1*  HCT 31.5* 33.2*  PLT 898* 992*   BMET  Recent Labs  03/23/15 0340 03/24/15 0330  NA 126* 132*  K 4.3 4.2  CL 94* 95*  CO2 25 27  GLUCOSE 217* 125*  BUN 7 7  CREATININE 0.77 0.73  CALCIUM 9.7 10.4*   PT/INR No results for input(s): LABPROT, INR in the last 72 hours. Comprehensive Metabolic Panel:    Component Value Date/Time   NA 132* 03/24/2015 0330   NA 126* 03/23/2015 0340   K 4.2 03/24/2015 0330   K 4.3 03/23/2015 0340   CL 95* 03/24/2015 0330   CL 94* 03/23/2015 0340   CO2 27 03/24/2015 0330   CO2 25 03/23/2015 0340   BUN 7 03/24/2015 0330   BUN 7 03/23/2015 0340   CREATININE 0.73 03/24/2015 0330   CREATININE 0.77 03/23/2015 0340   GLUCOSE 125* 03/24/2015 0330   GLUCOSE 217* 03/23/2015  0340   CALCIUM 10.4* 03/24/2015 0330   CALCIUM 9.7 03/23/2015 0340   AST 29 03/23/2015 0340   AST 34 03/18/2015 1859   ALT 23 03/23/2015 0340   ALT 31 03/18/2015 1859   ALKPHOS 155* 03/23/2015 0340   ALKPHOS 147* 03/18/2015 1859   BILITOT 0.9 03/23/2015 0340   BILITOT 0.7 03/18/2015 1859   PROT 6.5 03/23/2015 0340   PROT 7.1 03/18/2015 1859   ALBUMIN 2.2* 03/23/2015 0340   ALBUMIN 2.7* 03/18/2015 1859    Studies/Results: No results found.  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 Office: [place] 03/25/2015

## 2015-03-25 NOTE — Progress Notes (Signed)
Patient ID: Derek Mohr Sr., male   DOB: 1945/07/11, 70 y.o.   MRN: [id number]    Referring Physician(s): Gerkin  Chief Complaint: Abdominal abscesses  Subjective: Patient feels well.  Hungry and trying to decide what he is going to eat.  No pain around his drains.  Much more alert today.  Allergies: Morphine and related; Oxycontin; Quinine; and Voltaren  Medications: Prior to Admission medications   Medication Sig Start Date End Date Taking? Authorizing Provider  carvedilol (COREG) 25 MG tablet Take 25 mg by mouth 2 (two) times daily with a meal.  09/26/11  Yes Historical Provider, MD  [facility] TRIPLE MAGNESIUM COMPLEX PO Take 1-2 capsules by mouth 2 (two) times daily. Two capsules in the morning and one at night   Yes Historical Provider, MD  diphenhydrAMINE (BENADRYL) 25 mg capsule Take 50 mg by mouth 2 (two) times daily. Reported on 03/17/2015   Yes Historical Provider, MD  HYDROmorphone (DILAUDID) 2 MG tablet Take 1 tablet (2 mg total) by mouth every 3 (three) hours as needed for moderate pain or severe pain. 03/03/15  Yes Armandina Gemma, MD  insulin aspart (NOVOLOG) 100 UNIT/ML injection Inject 0-9 Units into the skin 3 (three) times daily before meals. 03/03/15  Yes Armandina Gemma, MD  insulin glargine (LANTUS) 100 UNIT/ML injection Inject 0.2 mLs (20 Units total) into the skin daily. 03/03/15  Yes Armandina Gemma, MD  losartan (COZAAR) 50 MG tablet Take 50 mg by mouth daily. 11/06/14  Yes Historical Provider, MD  naproxen sodium (ALEVE) 220 MG tablet Take 440 mg by mouth 2 (two) times daily with a meal.    Yes Historical Provider, MD  aspirin EC 81 MG tablet Take 81 mg by mouth at bedtime.    Historical Provider, MD  clobetasol ointment (TEMOVATE) [id number] % Apply 1 application topically daily as needed (for peeling skin on hands and feet). Reported on 03/17/2015    Historical Provider, MD  Insulin Syringes, Disposable, U-100 0.5 ML MISC 20 Units by Does not apply route daily. 03/03/15   Armandina Gemma, MD    lansoprazole (PREVACID) 15 MG capsule Take 30 mg by mouth daily as needed (reflux). Reported on 03/17/2015    Historical Provider, MD  [facility] DELICA LANCETS [id number] MISC 1 Device by Does not apply route 4 (four) times daily. 03/17/15   Renato Shin, MD    Vital Signs: BP 160/79 mmHg  Pulse 93  Temp(Src) 98.4 F (36.9 C) (Oral)  Resp 22  Ht 6\' 2"  (1.88 m)  Wt 238 lb 12.1 oz (108.3 kg)  BMI 30.64 kg/m2  SpO2 92%  Physical Exam: Abd: soft, minimally tender around his drains.  Sites are c/d/i  Drain 1 has purulent drainage with brown pancreatic debride, but overall less debride than yesterday.  Drain 2 still with tan milky purulent drainage.  Drain 1 - 60cc, drain 2 - 20cc (yesterday, there is more output in both drains already today)  Imaging: No results found.  Labs:  CBC:  Recent Labs  03/18/15 1859 03/21/15 0138 03/23/15 0340 03/24/15 0330  WBC 13.9* 20.7* 15.5* 15.0*  HGB 10.4* 10.3* 9.7* 10.1*  HCT 33.0* 33.8* 31.5* 33.2*  PLT 956* 892* 898* 992*    COAGS:  Recent Labs  02/19/15 0920 03/18/15 1859  INR 1.07 1.33  APTT 28  --     BMP:  Recent Labs  03/18/15 1859 03/21/15 0138 03/22/15 2145 03/23/15 0340 03/24/15 0330  NA 132* 129*  --  126* 132*  K  4.7 5.4* 4.6 4.3 4.2  CL 99* 97*  --  94* 95*  CO2 25 23  --  25 27  GLUCOSE 243* 161*  --  217* 125*  BUN 17 13  --  7 7  CALCIUM 10.4* 10.5*  --  9.7 10.4*  CREATININE 0.85 1.03  --  0.77 0.73  GFRNONAA >60 >60  --  >60 >60  GFRAA >60 >60  --  >60 >60    LIVER FUNCTION TESTS:  Recent Labs  02/19/15 0920 03/18/15 1859 03/23/15 0340  BILITOT 0.6 0.7 0.9  AST 19 34 29  ALT 23 31 23   ALKPHOS 83 147* 155*  PROT 6.9 7.1 6.5  ALBUMIN 4.2 2.7* 2.2*    Assessment and Plan: S/p distal pancreatectomy and splenectomy, now with postoperative fluid collections and pseudocyst formation -s/p drain placement x2 by Dr. Annamaria Boots on 1/28 -cont both drains -will d/w Dr. Laurence Ferrari regarding timing of repeat  CT scans, but likely won't be until this weekend at the earliest given timing of placement and quantity of drain output. Electronically Signed: Henreitta Cea 03/25/2015, 12:35 PM   I spent a total of 15 Minutes at the the patient's bedside AND on the patient's hospital floor or unit, greater than 50% of which was counseling/coordinating care for abdominal abscesses, s/p perc drains

## 2015-03-26 ENCOUNTER — Other Ambulatory Visit: Payer: Self-pay | Admitting: *Deleted

## 2015-03-26 LAB — CULTURE, BLOOD (ROUTINE X 2)
Culture: NO GROWTH
Culture: NO GROWTH

## 2015-03-26 LAB — CBC
HCT: 30.8 % — ABNORMAL LOW (ref 39.0–52.0)
Hemoglobin: 9.4 g/dL — ABNORMAL LOW (ref 13.0–17.0)
MCH: 23.4 pg — ABNORMAL LOW (ref 26.0–34.0)
MCHC: 30.5 g/dL (ref 30.0–36.0)
MCV: 76.8 fL — ABNORMAL LOW (ref 78.0–100.0)
Platelets: 936 10*3/uL (ref 150–400)
RBC: 4.01 MIL/uL — ABNORMAL LOW (ref 4.22–5.81)
RDW: 15.6 % — ABNORMAL HIGH (ref 11.5–15.5)
WBC: 13.2 10*3/uL — ABNORMAL HIGH (ref 4.0–10.5)

## 2015-03-26 LAB — GLUCOSE, CAPILLARY
Glucose-Capillary: 151 mg/dL — ABNORMAL HIGH (ref 65–99)
Glucose-Capillary: 151 mg/dL — ABNORMAL HIGH (ref 65–99)
Glucose-Capillary: 153 mg/dL — ABNORMAL HIGH (ref 65–99)
Glucose-Capillary: 154 mg/dL — ABNORMAL HIGH (ref 65–99)
Glucose-Capillary: 156 mg/dL — ABNORMAL HIGH (ref 65–99)
Glucose-Capillary: 160 mg/dL — ABNORMAL HIGH (ref 65–99)
Glucose-Capillary: 185 mg/dL — ABNORMAL HIGH (ref 65–99)

## 2015-03-26 LAB — BASIC METABOLIC PANEL
Anion gap: 8 (ref 5–15)
BUN: 14 mg/dL (ref 6–20)
CO2: 26 mmol/L (ref 22–32)
Calcium: 10.4 mg/dL — ABNORMAL HIGH (ref 8.9–10.3)
Chloride: 98 mmol/L — ABNORMAL LOW (ref 101–111)
Creatinine, Ser: 1.12 mg/dL (ref 0.61–1.24)
GFR calc Af Amer: >60 mL/min (ref >60)
GFR calc non Af Amer: >60 mL/min (ref >60)
Glucose, Bld: 168 mg/dL — ABNORMAL HIGH (ref 65–99)
Potassium: 4.2 mmol/L (ref 3.5–5.1)
Sodium: 132 mmol/L — ABNORMAL LOW (ref 135–145)

## 2015-03-26 LAB — VANCOMYCIN, TROUGH: Vancomycin Tr: 14 ug/mL (ref 10.0–20.0)

## 2015-03-26 MED ORDER — SODIUM CHLORIDE 0.9 % IV BOLUS (SEPSIS)
500.0000 mL | Freq: Once | INTRAVENOUS | Status: AC
  Administered 2015-03-26: 500 mL via INTRAVENOUS

## 2015-03-26 MED ORDER — PANTOPRAZOLE SODIUM 40 MG PO TBEC
40.0000 mg | DELAYED_RELEASE_TABLET | Freq: Every day | ORAL | Status: DC
  Administered 2015-03-26 – 2015-04-05 (×11): 40 mg via ORAL
  Filled 2015-03-26 (×11): qty 1

## 2015-03-26 NOTE — Progress Notes (Signed)
Patient ID: Derek Mohr Sr., male   DOB: 1945/06/15, 70 y.o.   MRN: [id number]  [facility]  Subjective: Patient up in bed, tolerating regular diet.  Productive cough.  Ambulated in hall today length of hall on ward 5 [place]!  Objective: Vital signs in last 24 hours: Temp:  [97.7 F (36.5 C)-99.1 F (37.3 C)] 97.7 F (36.5 C) (02/03 1407) Pulse Rate:  [87-98] 87 (02/03 1407) Resp:  [20] 20 (02/03 1407) BP: (116-159)/(61-71) 116/61 mmHg (02/03 1407) SpO2:  [91 %-93 %] 93 % (02/03 1407) Last BM Date: 03/26/15  Intake/Output from previous day: 02/02 0701 - 02/03 0700 In: 2880 [P.O.:600; I.V.:1800; IV Piggyback:450] Out: 1226 [Urine:1100; Drains:125; Stool:1] Intake/Output this shift: Total I/O In: 797 [P.O.:597; IV Piggyback:200] Out: 200 [Urine:200]  Physical Exam: HEENT - sclerae clear, mucous membranes moist Neck - soft Chest - coarse bilaterally Cor - RRR Abdomen - soft, obese; both drains now with thin creamy yellow-tan output Ext - no edema, non-tender Neuro - alert & oriented, no focal deficits  Lab Results:   Recent Labs  03/24/15 0330 03/26/15 0508  WBC 15.0* 13.2*  HGB 10.1* 9.4*  HCT 33.2* 30.8*  PLT 992* 936*   BMET  Recent Labs  03/24/15 0330 03/26/15 0508  NA 132* 132*  K 4.2 4.2  CL 95* 98*  CO2 27 26  GLUCOSE 125* 168*  BUN 7 14  CREATININE 0.73 1.12  CALCIUM 10.4* 10.4*   PT/INR No results for input(s): LABPROT, INR in the last 72 hours. Comprehensive Metabolic Panel:    Component Value Date/Time   NA 132* 03/26/2015 0508   NA 132* 03/24/2015 0330   K 4.2 03/26/2015 0508   K 4.2 03/24/2015 0330   CL 98* 03/26/2015 0508   CL 95* 03/24/2015 0330   CO2 26 03/26/2015 0508   CO2 27 03/24/2015 0330   BUN 14 03/26/2015 0508   BUN 7 03/24/2015 0330   CREATININE 1.12 03/26/2015 0508   CREATININE 0.73 03/24/2015 0330   GLUCOSE 168* 03/26/2015 0508   GLUCOSE 125* 03/24/2015 0330   CALCIUM 10.4*  03/26/2015 0508   CALCIUM 10.4* 03/24/2015 0330   AST 29 03/23/2015 0340   AST 34 03/18/2015 1859   ALT 23 03/23/2015 0340   ALT 31 03/18/2015 1859   ALKPHOS 155* 03/23/2015 0340   ALKPHOS 147* 03/18/2015 1859   BILITOT 0.9 03/23/2015 0340   BILITOT 0.7 03/18/2015 1859   PROT 6.5 03/23/2015 0340   PROT 7.1 03/18/2015 1859   ALBUMIN 2.2* 03/23/2015 0340   ALBUMIN 2.7* 03/18/2015 1859    Studies/Results: No results found.  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 Office: [place] 03/26/2015

## 2015-03-26 NOTE — Progress Notes (Signed)
Pharmacy IV to PO conversion  The patient is receiving Pantoprazole by the intravenous route.  Based on criteria approved by the Pharmacy and [place], the medication is being converted to the equivalent oral dose form.   No active GI bleeding or impaired absorption  Not s/p esophagectomy  Documented ability to take oral medications for > 24 hr  Plan to continue treatment for at least 1 day  If you have any questions about this conversion, please contact the Pharmacy Department (ext [phone]).  Thank you.  Reuel Boom, PharmD Pager: [phone] 03/26/2015, 10:44 AM

## 2015-03-26 NOTE — Progress Notes (Signed)
Pharmacy Antibiotic Note  Derek [name]. is a 70 y.o. male admitted on 03/18/2015 on day 6 vancomycin and day 7 ertapenem for abscesses/fluid collections following distal pancreatectomy/splenectomy.  Plan: Continue current antibiotics as ordered.  VT slightly low but will leave for now as afebrile and CrCl may continue to drop.   Height: 6\' 2"  (188 cm) Weight: 238 lb 12.1 oz (108.3 kg) IBW/kg (Calculated) : 82.2  Temp (24hrs), Avg:99 F (37.2 C), Min:98.9 F (37.2 C), Max:99.1 F (37.3 C)   Recent Labs Lab 03/20/15 2206 03/21/15 0138 03/22/15 2145 03/23/15 0340 03/24/15 0330 03/26/15 0508  WBC  --  20.7*  --  15.5* 15.0* 13.2*  CREATININE  --  1.03  --  0.77 0.73 1.12  LATICACIDVEN 0.9  --   --   --   --   --   VANCOTROUGH  --   --  10  --   --   --     Estimated Creatinine Clearance: 80.4 mL/min (by C-G formula based on Cr of 1.12).    Allergies  Allergen Reactions  . Morphine And Related Nausea And Vomiting  . Oxycontin [Oxycodone Hcl] Swelling    Lips swells. Tolerates immediate-release oxycodone as well as hydrocodone.  . Quinine Other (See Comments)    Platelets dropped  . Voltaren [Diclofenac Sodium] Other (See Comments)    Elevated liver enzymes    Antimicrobials this admission: 1/27 >> ertapenem (MD) >> 1/28 >> vanc >> 1/29 >> diflucan (MD) >> 2/1  Dose adjustments this admission: 1/30: 2130 VT = 10 on 750 q12h, increase to 1g q12h 2/3: 0930 VT = 14 on 1g q12 (last dose given 1 hr early)  Microbiology results: 1/28 MRSA PCR (+) 1/28 abscess cx: abundant CoNS, final 1/28 blood x 2: ngtd  Today, 03/26/2015: Temp: afebrile WBC: elevated but improved Renal: SCr bumped overnight 0.7 > 1.12; UOP low since 1/31 (RN notes reduced liquid intake); CrCl 80 CG, 62 N   Thank you for allowing pharmacy to be a part of this patient's care.  Reuel Boom, PharmD, BCPS Pager: [phone] 03/26/2015, 8:31 AM

## 2015-03-26 NOTE — NC FL2 (Signed)
[place] LEVEL OF CARE SCREENING TOOL     IDENTIFICATION  Patient Name: Derek HULLENDER Sr. Birthdate: [birth year] Sex: male Admission Date (Current Location): 03/18/2015  [facility] and [place] Number:  Herbalist and Address:  [facility],  [place] [place], [place]      Provider Number: [id number]  Attending Physician Name and Address:  Armandina Gemma, MD  Relative Name and Phone Number:       Current Level of Care: Hospital Recommended Level of Care: [place] Prior Approval Number:    Date Approved/Denied:   PASRR Number:    Discharge Plan: SNF    Current Diagnoses: Patient Active Problem List   Diagnosis Date Noted  . Intra-abdominal abscess ([place])   . Abdominal pain 03/18/2015  . Poorly controlled type 2 diabetes mellitus ([place])   . Acute respiratory acidosis   . 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 Hyponatremia 06/03/2013  . OA (osteoarthritis) of knee 06/02/2013  . Anal fissure 02/04/2013  . Meniere's disease   . GERD (gastroesophageal reflux disease)   . Kidney stone   . Hypertension   . Obstructive sleep apnea   . Morbid obesity ([place])   . Synovitis of knee 03/01/2012  . Chronic cough 11/03/2011  . COSTOCHONDRITIS, LEFT 10/08/2009  . RIB PAIN, LEFT SIDED 10/08/2009    Orientation RESPIRATION BLADDER Height & Weight     Self, Time, Situation, Place  Normal Continent Weight: 238 lb 12.1 oz (108.3 kg) Height:  '6\' 2"'$  (188 cm)  BEHAVIORAL SYMPTOMS/MOOD NEUROLOGICAL BOWEL NUTRITION STATUS   (n/a)  (NONE) Continent Diet (Diet Regular)  AMBULATORY STATUS COMMUNICATION OF NEEDS Skin   Extensive Assist Verbally Other (Comment) (S/p perc drain X 2 on 1/28)                       Personal Care Assistance Level of Assistance  Bathing, Feeding, Dressing Bathing Assistance: Maximum assistance (mod to +2 assist) Feeding assistance:  Independent (supervision) Dressing Assistance: Maximum assistance (mod to +2 assist)     Functional Limitations Info  Sight, Hearing, Speech Sight Info: Adequate Hearing Info: Impaired Speech Info: Adequate    SPECIAL CARE FACTORS FREQUENCY  PT (By licensed PT), OT (By licensed OT)     PT Frequency: 5 x a week OT Frequency: 5 x a week            Contractures Contractures Info: Not present    Additional Factors Info  Code Status, Allergies, Isolation Precautions, Insulin Sliding Scale Code Status Info: FULL code status Allergies Info: Morphine And Related, Oxycontin, Quinine, Voltaren     Isolation Precautions Info: Contact Precautions: 1.28.17 mrsa by pcr     Current Medications (03/26/2015):  This is the current hospital active medication list Current Facility-Administered Medications  Medication Dose Route Frequency Provider Last Rate Last Dose  . 0.9 %  sodium chloride infusion   Intravenous Continuous Armandina Gemma, MD 100 mL/hr at 03/26/15 0933    . acetaminophen (TYLENOL) tablet 650 mg  650 mg Oral Q4H PRN Armandina Gemma, MD      . antiseptic oral rinse (CPC / CETYLPYRIDINIUM CHLORIDE 0.05%) solution 7 mL  7 mL Mouth Rinse q12n4p Armandina Gemma, MD   7 mL at 03/25/15 1600  . aspirin tablet 325 mg  325 mg Oral Daily Armandina Gemma, MD   325 mg at 03/26/15 1001  . chlorhexidine (PERIDEX) 0.12 % solution  15 mL  15 mL Mouth Rinse BID Armandina Gemma, MD   15 mL at 03/26/15 1001  . ertapenem (INVANZ) 1 g in sodium chloride 0.9 % 50 mL IVPB  1 g Intravenous Q24H Armandina Gemma, MD   1 g at 03/25/15 1723  . feeding supplement (GLUCERNA SHAKE) (GLUCERNA SHAKE) liquid 237 mL  237 mL Oral TID BM Eugenie Filler, MD   237 mL at 03/26/15 1005  . heparin injection 5,000 Units  5,000 Units Subcutaneous 3 times per day Alphonsa Overall, MD   5,000 Units at 03/26/15 0533  . HYDROcodone-acetaminophen (NORCO/VICODIN) 5-325 MG per tablet 1-2 tablet  1-2 tablet Oral Q4H PRN Armandina Gemma, MD   2 tablet at  03/26/15 1001  . insulin aspart (novoLOG) injection 0-15 Units  0-15 Units Subcutaneous 6 times per day Armandina Gemma, MD   3 Units at 03/26/15 1003  . ondansetron (ZOFRAN-ODT) disintegrating tablet 4 mg  4 mg Oral Q6H PRN Armandina Gemma, MD       Or  . ondansetron [facility]) injection 4 mg  4 mg Intravenous Q6H PRN Armandina Gemma, MD   4 mg at 03/24/15 1840  . pantoprazole (PROTONIX) EC tablet 40 mg  40 mg Oral QHS Drew A Wofford, RPH      . traMADol (ULTRAM) tablet 50 mg  50 mg Oral Q6H Stark Klein, MD   50 mg at 03/26/15 0533  . vancomycin (VANCOCIN) IVPB 1000 mg/200 mL premix  1,000 mg Intravenous Q12H Dorrene German, RPH   1,000 mg at 03/26/15 1001     Discharge Medications: Please see discharge summary for a list of discharge medications.  Relevant Imaging Results:  Relevant Lab Results:   Additional Information SSN: [id number]  Damyra Luscher, [place] A, LCSW

## 2015-03-26 NOTE — Consult Note (Signed)
[facility] CM Inpatient Consult   03/26/2015  Derek D Gwin Sr. 12/17/1945 [id number]   Met with patient and wife at bedside. Derek [name] has been previously followed by [facility] Telephonic RNCM. Since hospitalization, patient will be assigned to [facility] Community team. Written consent obtained. Derek [name] reports patient will need rehab post discharge. She also expresses concern that patient is continuing to get weaker the longer and longer he has been in the bed without getting up. States she has made staff aware of this. Made her aware that patient will be assigned to [facility] Licensed CSW to follow up with at SNF when disposition is known. Will continue to follow. Made inpatient RNCM aware [facility] following. [facility] Care Management packet left at bedside along with contact information. Also informed patient and wife that [facility] Care Management will not interfere with services provided by SNF or home health.    Atika Hall, MSN-Ed, RN,BSN [facility] Care Management Hospital Liaison [phone]   

## 2015-03-26 NOTE — Progress Notes (Signed)
Patient ID: Derek Mohr Sr., male   DOB: Dec 07, 1945, 69 y.o.   MRN: [id number]    Referring Physician(s): CCS  Chief Complaint:  Abdominal abscesses  Subjective:  Pt feeling about the same; no acute changes; eager to get out of hospital  Allergies: Morphine and related; Oxycontin; Quinine; and Voltaren  Medications: Prior to Admission medications   Medication Sig Start Date End Date Taking? Authorizing Provider  carvedilol (COREG) 25 MG tablet Take 25 mg by mouth 2 (two) times daily with a meal.  09/26/11  Yes Historical Provider, MD  [employer] TRIPLE MAGNESIUM COMPLEX PO Take 1-2 capsules by mouth 2 (two) times daily. Two capsules in the morning and one at night   Yes Historical Provider, MD  diphenhydrAMINE (BENADRYL) 25 mg capsule Take 50 mg by mouth 2 (two) times daily. Reported on 03/17/2015   Yes Historical Provider, MD  HYDROmorphone (DILAUDID) 2 MG tablet Take 1 tablet (2 mg total) by mouth every 3 (three) hours as needed for moderate pain or severe pain. 03/03/15  Yes Armandina Gemma, MD  insulin aspart (NOVOLOG) 100 UNIT/ML injection Inject 0-9 Units into the skin 3 (three) times daily before meals. 03/03/15  Yes Armandina Gemma, MD  insulin glargine (LANTUS) 100 UNIT/ML injection Inject 0.2 mLs (20 Units total) into the skin daily. 03/03/15  Yes Armandina Gemma, MD  losartan (COZAAR) 50 MG tablet Take 50 mg by mouth daily. 11/06/14  Yes Historical Provider, MD  naproxen sodium (ALEVE) 220 MG tablet Take 440 mg by mouth 2 (two) times daily with a meal.    Yes Historical Provider, MD  aspirin EC 81 MG tablet Take 81 mg by mouth at bedtime.    Historical Provider, MD  clobetasol ointment (TEMOVATE) [id number] % Apply 1 application topically daily as needed (for peeling skin on hands and feet). Reported on 03/17/2015    Historical Provider, MD  Insulin Syringes, Disposable, U-100 0.5 ML MISC 20 Units by Does not apply route daily. 03/03/15   Armandina Gemma, MD  lansoprazole (PREVACID) 15 MG capsule Take 30 mg  by mouth daily as needed (reflux). Reported on 03/17/2015    Historical Provider, MD  [facility] DELICA LANCETS [id number] MISC 1 Device by Does not apply route 4 (four) times daily. 03/17/15   Renato Shin, MD     Vital Signs: BP 116/61 mmHg  Pulse 87  Temp(Src) 97.7 F (36.5 C) (Oral)  Resp 20  Ht 6\' 2"  (1.88 m)  Wt 238 lb 12.1 oz (108.3 kg)  BMI 30.64 kg/m2  SpO2 93%  Physical Exam left abd drains intact, outputs 100/25 cc beige colored fluid; drains irrigated without difficulty; cx's- staph  Imaging: No results found.  Labs:  CBC:  Recent Labs  03/21/15 0138 03/23/15 0340 03/24/15 0330 03/26/15 0508  WBC 20.7* 15.5* 15.0* 13.2*  HGB 10.3* 9.7* 10.1* 9.4*  HCT 33.8* 31.5* 33.2* 30.8*  PLT 892* 898* 992* 936*    COAGS:  Recent Labs  02/19/15 0920 03/18/15 1859  INR 1.07 1.33  APTT 28  --     BMP:  Recent Labs  03/21/15 0138 03/22/15 2145 03/23/15 0340 03/24/15 0330 03/26/15 0508  NA 129*  --  126* 132* 132*  K 5.4* 4.6 4.3 4.2 4.2  CL 97*  --  94* 95* 98*  CO2 23  --  25 27 26   GLUCOSE 161*  --  217* 125* 168*  BUN 13  --  7 7 14   CALCIUM 10.5*  --  9.7 10.4*  10.4*  CREATININE 1.03  --  0.77 0.73 1.12  GFRNONAA >60  --  >60 >60 >60  GFRAA >60  --  >60 >60 >60    LIVER FUNCTION TESTS:  Recent Labs  02/19/15 0920 03/18/15 1859 03/23/15 0340  BILITOT 0.6 0.7 0.9  AST 19 34 29  ALT 23 31 23   ALKPHOS 83 147* 155*  PROT 6.9 7.1 6.5  ALBUMIN 4.2 2.7* 2.2*    Assessment and Plan: S/p distal pancreatectomy and splenectomy, now with postoperative fluid collections and pseudocyst formation -s/p drain placement x2 by Dr. Annamaria Boots on 1/28 - AF; WBC 13.2(15); hgb 9.4; creat ok -fluid cx's - staph; cont current tx; would repeat CT once drain output has sig decreased or if clinical status worsens Electronically Signed: D. Rowe Robert 03/26/2015, 2:45 PM   I spent a total of 15 minutes at the the patient's bedside AND on the patient's hospital floor or  unit, greater than 50% of which was counseling/coordinating care for abdominal abscess drains

## 2015-03-26 NOTE — Progress Notes (Signed)
Physical Therapy Treatment Patient Details Name: Derek VEILLETTE Sr. MRN: [id number] DOB: 09-13-1945 Today's Date: 03/26/2015    History of Present Illness 70yo male who is s/p pancreatectomy 2* cancer now readmitted with abscess. H/o B TKA, HTN, DM, Meniere's disease (with falls), obesity, anxiety    PT Comments    Progressing with mobility. Walked ~75 feet x2 with EVA walker. Pt tolerated activity fairly well. Continue to recommend SNF.   Follow Up Recommendations  SNF     Equipment Recommendations  Rolling walker with 5" wheels    Recommendations for Other Services       Precautions / Restrictions Precautions Precautions: Fall Precaution Comments: drains L side. Hx of Meniere's disease    Mobility  Bed Mobility Overal bed mobility: Needs Assistance Bed Mobility: Supine to Sit;Sit to Supine     Supine to sit: HOB elevated;Min assist Sit to supine: Mod assist;HOB elevated   General bed mobility comments: Assist for trunk and LEs. Increased time.   Transfers Overall transfer level: Needs assistance Equipment used:  (EVA walker) Transfers: Sit to/from Stand Sit to Stand: Mod assist;+2 safety/equipment         General transfer comment: Assist to rise, stabilize, control descent, UE placement. VCs safety, hand placement  Ambulation/Gait Ambulation/Gait assistance: Min assist;+2 physical assistance;+2 safety/equipment Ambulation Distance (Feet): 75 Feet (x2) Assistive device:  (EVA walker) Gait Pattern/deviations: Trunk flexed;Decreased stride length;Step-through pattern     General Gait Details: slow gait speed. VCs for posture. Assist to stabilize pt and walker intermittently. 1 seated rest break. Wife followed with recliner   Stairs            Wheelchair Mobility    Modified Rankin (Stroke Patients Only)       Balance                                    Cognition Arousal/Alertness: Awake/alert Behavior During Therapy: WFL for  tasks assessed/performed Overall Cognitive Status: Within Functional Limits for tasks assessed                      Exercises Other Exercises Other Exercises: Issued theraband at pt's request so he can work UEs while in bed    General Comments        Pertinent Vitals/Pain Pain Assessment: Faces Faces Pain Scale: Hurts little more Pain Location: chronic back pain Pain Descriptors / Indicators: Sore Pain Intervention(s): Monitored during session;Repositioned    Home Living                      Prior Function            PT Goals (current goals can now be found in the care plan section) Progress towards PT goals: Progressing toward goals    Frequency  Min 3X/week    PT Plan Current plan remains appropriate    Co-evaluation             End of Session Equipment Utilized During Treatment: Gait belt Activity Tolerance: Patient tolerated treatment well Patient left: in bed;with call bell/phone within reach;with family/visitor present     Time: 1455-1553 PT Time Calculation (min) (ACUTE ONLY): 58 min  Charges:  $Gait Training: 38-52 mins $Therapeutic Activity: 8-22 mins                    G Codes:  Weston Anna, MPT Pager: [phone]

## 2015-03-27 ENCOUNTER — Inpatient Hospital Stay (HOSPITAL_COMMUNITY): Payer: PPO

## 2015-03-27 LAB — GLUCOSE, CAPILLARY
Glucose-Capillary: 124 mg/dL — ABNORMAL HIGH (ref 65–99)
Glucose-Capillary: 128 mg/dL — ABNORMAL HIGH (ref 65–99)
Glucose-Capillary: 143 mg/dL — ABNORMAL HIGH (ref 65–99)
Glucose-Capillary: 153 mg/dL — ABNORMAL HIGH (ref 65–99)
Glucose-Capillary: 156 mg/dL — ABNORMAL HIGH (ref 65–99)
Glucose-Capillary: 180 mg/dL — ABNORMAL HIGH (ref 65–99)
Glucose-Capillary: 182 mg/dL — ABNORMAL HIGH (ref 65–99)

## 2015-03-27 LAB — BASIC METABOLIC PANEL
Anion gap: 10 (ref 5–15)
BUN: 12 mg/dL (ref 6–20)
CO2: 22 mmol/L (ref 22–32)
Calcium: 9.8 mg/dL (ref 8.9–10.3)
Chloride: 98 mmol/L — ABNORMAL LOW (ref 101–111)
Creatinine, Ser: 1.07 mg/dL (ref 0.61–1.24)
GFR calc Af Amer: >60 mL/min (ref >60)
GFR calc non Af Amer: >60 mL/min (ref >60)
Glucose, Bld: 154 mg/dL — ABNORMAL HIGH (ref 65–99)
Potassium: 3.9 mmol/L (ref 3.5–5.1)
Sodium: 130 mmol/L — ABNORMAL LOW (ref 135–145)

## 2015-03-27 MED ORDER — MAGIC MOUTHWASH
15.0000 mL | Freq: Four times a day (QID) | ORAL | Status: DC
  Administered 2015-03-27 (×2): 15 mL via ORAL
  Filled 2015-03-27 (×6): qty 15

## 2015-03-27 MED ORDER — SODIUM CHLORIDE 0.9 % IV SOLN
1250.0000 mg | Freq: Two times a day (BID) | INTRAVENOUS | Status: DC
  Administered 2015-03-27 – 2015-03-29 (×4): 1250 mg via INTRAVENOUS
  Filled 2015-03-27 (×4): qty 1250

## 2015-03-27 NOTE — Progress Notes (Signed)
Pharmacy Antibiotic Note  Derek [name]. is a 70 y.o. male admitted on 03/18/2015 on day 6 vancomycin and day 7 ertapenem for abscesses/fluid collections following distal pancreatectomy/splenectomy.  Yesterday VT slightly low but left dosing as is since afebrile and CrCl may continue to drop.  Today with new fevers; SCr better but UOP worse.   Plan:  Will increase vancomycin to 1250 mg IV q12 hr.  May very well need to reduce dose again tomorrow if CrCl follows UOP, but fevers seem too far removed from drain placement to be post-op and would like to r/o subtherapeutic levels as cause of new fevers.  Only other pathogen to consider at this point would be pseudomonas, as regimen covers all other common pathogens  Consider re-culture as appropriate  Encourage better PO fluid intake   Height: 6\' 2"  (188 cm) Weight: 238 lb 12.1 oz (108.3 kg) IBW/kg (Calculated) : 82.2  Temp (24hrs), Avg:99.3 F (37.4 C), Min:97.7 F (36.5 C), Max:101.1 F (38.4 C)   Recent Labs Lab 03/20/15 2206 03/21/15 0138 03/22/15 2145 03/23/15 0340 03/24/15 0330 03/26/15 0508 03/26/15 0921 03/27/15 0544  WBC  --  20.7*  --  15.5* 15.0* 13.2*  --   --   CREATININE  --  1.03  --  0.77 0.73 1.12  --  1.07  LATICACIDVEN 0.9  --   --   --   --   --   --   --   VANCOTROUGH  --   --  10  --   --   --  14  --     Estimated Creatinine Clearance: 84.1 mL/min (by C-G formula based on Cr of 1.07).    Allergies  Allergen Reactions  . Morphine And Related Nausea And Vomiting  . Oxycontin [Oxycodone Hcl] Swelling    Lips swells. Tolerates immediate-release oxycodone as well as hydrocodone.  . Quinine Other (See Comments)    Platelets dropped  . Voltaren [Diclofenac Sodium] Other (See Comments)    Elevated liver enzymes    Antimicrobials this admission: 1/27 >> ertapenem (MD) >> 1/28 >> vanc >> 1/29 >> diflucan (MD) >> 2/1  Dose adjustments this admission: 1/30: 2130 VT = 10 on 750 q12h, increase to  1g q12h 2/3: 0930 VT = 14 on 1g q12 (last dose given 1 hr early)  Microbiology results: 1/28 MRSA PCR (+) 1/28 abscess cx: abundant CoNS, final 1/28 blood x 2: ngtd  Today, 03/27/2015: Temp: new fevers,  WBC: elevated but improved Renal: SCr bumped 2/3; improved today but UOP worse (RN notes reduced liquid intake); CrCl 84 CG, 65 N   Thank you for allowing pharmacy to be a part of this patient's care.  Reuel Boom, PharmD, BCPS Pager: [phone] 03/27/2015, 10:02 AM

## 2015-03-27 NOTE — Progress Notes (Signed)
Patient ID: Derek Mohr Sr., male   DOB: Nov 26, 1945, 70 y.o.   MRN: [id number]  [facility]  Subjective: Patient in bed.  Confused in radiology this AM - refused CXR.  Chronic cough with phlegm production.  Daughter at bedside this AM - discussed course of treatment.  Objective: Vital signs in last 24 hours: Temp:  [97.7 F (36.5 C)-101.1 F (38.4 C)] 98.5 F (36.9 C) (02/04 0530) Pulse Rate:  [87-101] 97 (02/04 0429) Resp:  [20] 20 (02/04 0429) BP: (116-181)/(61-76) 140/70 mmHg (02/04 0429) SpO2:  [92 %-94 %] 92 % (02/04 0429) Last BM Date: 03/26/15  Intake/Output from previous day: 02/03 0701 - 02/04 0700 In: 2225.3 [P.O.:717; I.V.:1088.3; IV Piggyback:400] Out: 474 [Urine:375; Drains:99] Intake/Output this shift:    Physical Exam: HEENT - sclerae clear, mucous membranes moist Neck - soft Chest - clear bilaterally Cor - RRR Abdomen - soft, obese; non-tender; drains with thin creamy output, slowly decreasing in volume Ext - no edema, non-tender Neuro - alert & oriented, no focal deficits  Lab Results:   Recent Labs  03/26/15 0508  WBC 13.2*  HGB 9.4*  HCT 30.8*  PLT 936*   BMET  Recent Labs  03/26/15 0508 03/27/15 0544  NA 132* 130*  K 4.2 3.9  CL 98* 98*  CO2 26 22  GLUCOSE 168* 154*  BUN 14 12  CREATININE 1.12 1.07  CALCIUM 10.4* 9.8   PT/INR No results for input(s): LABPROT, INR in the last 72 hours. Comprehensive Metabolic Panel:    Component Value Date/Time   NA 130* 03/27/2015 0544   NA 132* 03/26/2015 0508   K 3.9 03/27/2015 0544   K 4.2 03/26/2015 0508   CL 98* 03/27/2015 0544   CL 98* 03/26/2015 0508   CO2 22 03/27/2015 0544   CO2 26 03/26/2015 0508   BUN 12 03/27/2015 0544   BUN 14 03/26/2015 0508   CREATININE 1.07 03/27/2015 0544   CREATININE 1.12 03/26/2015 0508   GLUCOSE 154* 03/27/2015 0544   GLUCOSE 168* 03/26/2015 0508   CALCIUM 9.8 03/27/2015 0544   CALCIUM 10.4* 03/26/2015 0508   AST 29 03/23/2015 0340   AST 34 03/18/2015 1859   ALT 23 03/23/2015 0340   ALT 31 03/18/2015 1859   ALKPHOS 155* 03/23/2015 0340   ALKPHOS 147* 03/18/2015 1859   BILITOT 0.9 03/23/2015 0340   BILITOT 0.7 03/18/2015 1859   PROT 6.5 03/23/2015 0340   PROT 7.1 03/18/2015 1859   ALBUMIN 2.2* 03/23/2015 0340   ALBUMIN 2.7* 03/18/2015 1859    Studies/Results: No results found.  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 Office: [place]2/05/2015

## 2015-03-27 NOTE — Progress Notes (Signed)
Patient complaining of mouth pain and stinging.  Patient's mouth is red with yellow patches in between folds.  Called Dr. Gala Lewandowsky office and left message for MD on page.

## 2015-03-27 NOTE — Progress Notes (Addendum)
CSW continuing to follow.   CSW spoke with pt surgeon today who was able to update CSW that pt will have CT on Monday to evaluate collections, drain position and decisions about need for IV antibiotics upon discharge will not be made until closer to discharge as it will depend on pt progress.   Pt SNF search will likely need to be re-initiated closer to discharge and when pt discharge needs are further clarified.  CSW notified MD of need for MD to sign 30 day note on shadow chart and MD will sign tomorrow.   MD reports that pt discharge date is unknown at this time.  CSW updated pt and pt wife at bedside and provided support.   CSW to continue to follow to provide support and assist with pt discharge planning needs.   Alison Murray, MSW, LCSW Clinical Social Work Weekend coverage [phone]

## 2015-03-27 NOTE — Progress Notes (Signed)
Physical Therapy Treatment Patient Details Name: ARJAY HIBBERD Sr. MRN: [id number] DOB: [birth year] Today's Date: 03/27/2015    History of Present Illness 70yo male who is s/p pancreatectomy 2* cancer now readmitted with abscess. H/o B TKA, HTN, DM, Meniere's disease (with falls), obesity, anxiety    PT Comments    Assisted pt OOB to [facility] then amb a limited distance due to MAX c/o low back pain and weakness.    Follow Up Recommendations  SNF     Equipment Recommendations       Recommendations for Other Services       Precautions / Restrictions Precautions Precautions: Fall Precaution Comments: drains L side. Hx of Meniere's disease Restrictions Weight Bearing Restrictions: No RLE Weight Bearing: Weight bearing as tolerated    Mobility  Bed Mobility Overal bed mobility: Needs Assistance Bed Mobility: Supine to Sit     Supine to sit: Mod assist     General bed mobility comments: Assist for trunk and LEs. Increased time.   Transfers Overall transfer level: Needs assistance Equipment used: Rolling walker (2 wheeled) Transfers: Sit to/from Stand Sit to Stand: Mod assist;+2 safety/equipment         General transfer comment: Assist to rise, stabilize, control descent, UE placement. VCs safety, hand placement  Ambulation/Gait Ambulation/Gait assistance: Min assist;+2 physical assistance;+2 safety/equipment Ambulation Distance (Feet): 8 Feet Assistive device: Rolling walker (2 wheeled)   Gait velocity: decreased   General Gait Details: decreased amb distance due to increased c/o fatigue from using BSC and MAX c/o low back pain.  Very slow gait.    Stairs            Wheelchair Mobility    Modified Rankin (Stroke Patients Only)       Balance                                    Cognition                            Exercises      General Comments        Pertinent Vitals/Pain Pain Assessment: 0-10 Pain Score:  10-Worst pain ever Pain Location: low back Pain Descriptors / Indicators: Sharp Pain Intervention(s): Monitored during session;Repositioned;Heat applied (heat per RN)    Home Living                      Prior Function            PT Goals (current goals can now be found in the care plan section) Progress towards PT goals: Progressing toward goals    Frequency  Min 3X/week    PT Plan Current plan remains appropriate    Co-evaluation             End of Session Equipment Utilized During Treatment: Gait belt Activity Tolerance: Treatment limited secondary to medical complications (Comment);Patient limited by fatigue;Patient limited by pain Patient left: in chair;with call bell/phone within reach;with family/visitor present;with chair alarm set     Time: 1005-1035 PT Time Calculation (min) (ACUTE ONLY): 30 min  Charges:  $Gait Training: 8-22 mins $Therapeutic Activity: 8-22 mins                    G Codes:      Rica Koyanagi  PTA [facility]  Acute  Rehab Pager  [phone]  

## 2015-03-27 NOTE — Clinical Social Work Note (Addendum)
Clinical Social Work Assessment- Late Entry  Patient Details  Name: Derek MAZON Sr. MRN: [id number] Date of Birth: Nov 24, 1945  Date of referral:  03/27/15               Reason for consult:  Discharge Planning                Permission sought to share information with:  Family Supports Permission granted to share information::  Yes, Verbal Permission Granted  Name::     Stevin Bielinski  Agency::     Relationship::  wife  Contact Information:  [phone]  Housing/Transportation Living arrangements for the past 2 months:  Single Family Home Source of Information:  Spouse Patient Interpreter Needed:  None Criminal Activity/Legal Involvement Pertinent to Current Situation/Hospitalization:  No - Comment as needed Significant Relationships:  Spouse Lives with:  Spouse Do you feel safe going back to the place where you live?  No Need for family participation in patient care:  Yes (Comment)  Care giving concerns:  Pt admitted from home with pt wife. Pt weaker at this time and PT recommending SNF.    Social Worker assessment / plan:    CSW received referral for New SNF.   CSW met with pt wife at bedside. Pt sleeping at this time.  Pt wife expressed frustration because pt has not been out of bed. Pt wife expressed that her husband if very full of life at baseline and she does not feel that pt has quality of life at this time. Pt wife shared that she spoke with MD about concerns and MD was going to speak with PT about pt having PT treatment more often. CSW provided emotional support as pt wife discussed that she just wants to see pt improving and it is hard for her to understand how pt has gotten like he is presenting today.   CSW discussed recommendation for rehab upon discharge and pt wife is agreeable. Pt wife discussed that pt is not near ready for discharge, but pt wife agreeable to CSW starting process of SNF search.  CSW to complete FL2 and initiate SNF search to [facility].  CSW provided supportive counseling to pt wife and pt wife expressed feeling better being able to speak to someone about her concerns.   CSW to follow up with pt wife re: disposition planning and provide continued support.   Employment status:  Retired Nurse, adult PT Recommendations:  [place] / Referral to community resources:  [place]  Patient/Family's Response to care:  Pt sleeping during assessment and pt wife reports that pt is very different from his baseline at this time. Pt wife agreeable to explore rehab options to begin plans for d/c, but very eager for pt to get more therapy while in the hospital.   Patient/Family's Understanding of and Emotional Response to Diagnosis, Current Treatment, and Prognosis:  Pt wife displayed knowledge surrounding pt diagnosis and treatment plan and aware that pt will need a number of days in the hospital more before transitioning to rehab.   Emotional Assessment Appearance:  Appears stated age Attitude/Demeanor/Rapport:  Other (pt appropriate) Affect (typically observed):  Appropriate Orientation:  Oriented to Self, Oriented to Place, Oriented to  Time Alcohol / Substance use:  Not Applicable Psych involvement (Current and /or in the community):  No (Comment)  Discharge Needs  Concerns to be addressed:  Discharge Planning Concerns Readmission within the last 30 days:  No Current discharge  risk:  Physical Impairment Barriers to Discharge:  Continued Medical Work up   Evalisse Prajapati, [place] [facility], LCSW

## 2015-03-28 LAB — GLUCOSE, CAPILLARY
Glucose-Capillary: 93 mg/dL (ref 65–99)
Glucose-Capillary: 125 mg/dL — ABNORMAL HIGH (ref 65–99)
Glucose-Capillary: 160 mg/dL — ABNORMAL HIGH (ref 65–99)
Glucose-Capillary: 185 mg/dL — ABNORMAL HIGH (ref 65–99)
Glucose-Capillary: 134 mg/dL — ABNORMAL HIGH (ref 65–99)

## 2015-03-28 MED ORDER — MAGIC MOUTHWASH W/LIDOCAINE
15.0000 mL | Freq: Four times a day (QID) | ORAL | Status: DC
  Administered 2015-03-28 – 2015-04-04 (×22): 15 mL via ORAL
  Filled 2015-03-28 (×32): qty 15

## 2015-03-28 NOTE — Progress Notes (Signed)
Patient ID: Derek Mohr Sr., male   DOB: [birth year], 70 y.o.   MRN: [id number]  [facility]  Subjective: Patient up in bed, waiting for breakfast.  Complains of burning pain in mouth.  Alert, oriented.  Objective: Vital signs in last 24 hours: Temp:  [98.2 F (36.8 C)-99.3 F (37.4 C)] 98.4 F (36.9 C) (02/05 0419) Pulse Rate:  [80-87] 80 (02/05 0419) Resp:  [18-20] 18 (02/05 0419) BP: (156-168)/(70-83) 168/70 mmHg (02/05 0419) SpO2:  [92 %-96 %] 92 % (02/05 0419) Last BM Date: 03/26/15  Intake/Output from previous day: 02/04 0701 - 02/05 0700 In: 1000 [P.O.:480; IV Piggyback:500] Out: 370 [Urine:325; Drains:45] Intake/Output this shift:    Physical Exam: HEENT - sclerae clear, mucous membranes moist; no sign of candida; tongue "cobbled" but not ulcerated Neck - soft Chest - clear bilaterally Cor - RRR Abdomen - soft, obese, mild distension; minimal tenderness LUQ; drains with small thin tan fluid Ext - no edema, non-tender Neuro - alert & oriented, no focal deficits  Lab Results:   Recent Labs  03/26/15 0508  WBC 13.2*  HGB 9.4*  HCT 30.8*  PLT 936*   BMET  Recent Labs  03/26/15 0508 03/27/15 0544  NA 132* 130*  K 4.2 3.9  CL 98* 98*  CO2 26 22  GLUCOSE 168* 154*  BUN 14 12  CREATININE 1.12 1.07  CALCIUM 10.4* 9.8   PT/INR No results for input(s): LABPROT, INR in the last 72 hours. Comprehensive Metabolic Panel:    Component Value Date/Time   NA 130* 03/27/2015 0544   NA 132* 03/26/2015 0508   K 3.9 03/27/2015 0544   K 4.2 03/26/2015 0508   CL 98* 03/27/2015 0544   CL 98* 03/26/2015 0508   CO2 22 03/27/2015 0544   CO2 26 03/26/2015 0508   BUN 12 03/27/2015 0544   BUN 14 03/26/2015 0508   CREATININE 1.07 03/27/2015 0544   CREATININE 1.12 03/26/2015 0508   GLUCOSE 154* 03/27/2015 0544   GLUCOSE 168* 03/26/2015 0508   CALCIUM 9.8 03/27/2015 0544   CALCIUM 10.4* 03/26/2015 0508   AST 29 03/23/2015  0340   AST 34 03/18/2015 1859   ALT 23 03/23/2015 0340   ALT 31 03/18/2015 1859   ALKPHOS 155* 03/23/2015 0340   ALKPHOS 147* 03/18/2015 1859   BILITOT 0.9 03/23/2015 0340   BILITOT 0.7 03/18/2015 1859   PROT 6.5 03/23/2015 0340   PROT 7.1 03/18/2015 1859   ALBUMIN 2.2* 03/23/2015 0340   ALBUMIN 2.7* 03/18/2015 1859    Studies/Results: No results found.  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 Office: [place] 03/28/2015

## 2015-03-28 NOTE — Progress Notes (Signed)
Referring Physician(s): Gerkin  Chief Complaint:  Post op intraabdominal abscess Drains x 2 placed 1/28  Subjective:  Up in chair Still weak Drains intact With purulent discharge  Allergies: Morphine and related; Oxycontin; Quinine; and Voltaren  Medications: Prior to Admission medications   Medication Sig Start Date End Date Taking? Authorizing Provider  carvedilol (COREG) 25 MG tablet Take 25 mg by mouth 2 (two) times daily with a meal.  09/26/11  Yes Historical Provider, MD  [employer] TRIPLE MAGNESIUM COMPLEX PO Take 1-2 capsules by mouth 2 (two) times daily. Two capsules in the morning and one at night   Yes Historical Provider, MD  diphenhydrAMINE (BENADRYL) 25 mg capsule Take 50 mg by mouth 2 (two) times daily. Reported on 03/17/2015   Yes Historical Provider, MD  HYDROmorphone (DILAUDID) 2 MG tablet Take 1 tablet (2 mg total) by mouth every 3 (three) hours as needed for moderate pain or severe pain. 03/03/15  Yes Armandina Gemma, MD  insulin aspart (NOVOLOG) 100 UNIT/ML injection Inject 0-9 Units into the skin 3 (three) times daily before meals. 03/03/15  Yes Armandina Gemma, MD  insulin glargine (LANTUS) 100 UNIT/ML injection Inject 0.2 mLs (20 Units total) into the skin daily. 03/03/15  Yes Armandina Gemma, MD  losartan (COZAAR) 50 MG tablet Take 50 mg by mouth daily. 11/06/14  Yes Historical Provider, MD  naproxen sodium (ALEVE) 220 MG tablet Take 440 mg by mouth 2 (two) times daily with a meal.    Yes Historical Provider, MD  aspirin EC 81 MG tablet Take 81 mg by mouth at bedtime.    Historical Provider, MD  clobetasol ointment (TEMOVATE) [id number] % Apply 1 application topically daily as needed (for peeling skin on hands and feet). Reported on 03/17/2015    Historical Provider, MD  Insulin Syringes, Disposable, U-100 0.5 ML MISC 20 Units by Does not apply route daily. 03/03/15   Armandina Gemma, MD  lansoprazole (PREVACID) 15 MG capsule Take 30 mg by mouth daily as needed (reflux). Reported on  03/17/2015    Historical Provider, MD  [facility] DELICA LANCETS [id number] MISC 1 Device by Does not apply route 4 (four) times daily. 03/17/15   Renato Shin, MD     Vital Signs: BP 168/70 mmHg  Pulse 80  Temp(Src) 98.4 F (36.9 C) (Oral)  Resp 18  Ht 6' 2"$  (1.88 m)  Wt 238 lb 12.1 oz (108.3 kg)  BMI 30.64 kg/m2  SpO2 92%  Physical Exam  Abdominal: Soft. Bowel sounds are normal.  Skin: Skin is warm.  Drains intact Left upper abdomen Output 40 cc and 130 cc yesterday Both with 25 cc in bag---purulent  Sites are both not infected but with build up of "crust"  Removed without issue No bleeding; no infection  afeb  Nursing note and vitals reviewed.   Imaging: No results found.  Labs:  CBC:  Recent Labs  03/21/15 0138 03/23/15 0340 03/24/15 0330 03/26/15 0508  WBC 20.7* 15.5* 15.0* 13.2*  HGB 10.3* 9.7* 10.1* 9.4*  HCT 33.8* 31.5* 33.2* 30.8*  PLT 892* 898* 992* 936*    COAGS:  Recent Labs  02/19/15 0920 03/18/15 1859  INR 1.07 1.33  APTT 28  --     BMP:  Recent Labs  03/23/15 0340 03/24/15 0330 03/26/15 0508 03/27/15 0544  NA 126* 132* 132* 130*  K 4.3 4.2 4.2 3.9  CL 94* 95* 98* 98*  CO2 25 27 26 22  $ GLUCOSE 217* 125* 168* 154*  BUN 7  $7 14 12  Y$ CALCIUM 9.7 10.4* 10.4* 9.8  CREATININE 0.77 0.73 1.12 1.07  GFRNONAA >60 >60 >60 >60  GFRAA >60 >60 >60 >60    LIVER FUNCTION TESTS:  Recent Labs  02/19/15 0920 03/18/15 1859 03/23/15 0340  BILITOT 0.6 0.7 0.9  AST 19 34 29  ALT 23 31 23  $ ALKPHOS 83 147* 155*  PROT 6.9 7.1 6.5  ALBUMIN 4.2 2.7* 2.2*    Assessment and Plan:  Abscess drains intact Output significant Need Re Ct 2/6 per Dr Harlow Asa order Will await result    Electronically Signed: Taliah Porche A 03/28/2015, 10:28 AM   I spent a total of 15 Minutes at the the patient's bedside AND on the patient's hospital floor or unit, greater than 50% of which was counseling/coordinating care for abscess drains

## 2015-03-28 NOTE — Progress Notes (Signed)
Pharmacy Antibiotic Note  Derek [name]. is a 70 y.o. male admitted on 03/18/2015 on day 6 vancomycin and day 7 ertapenem for abscesses/fluid collections following distal pancreatectomy/splenectomy.  Increased vancomycin yesterday d/t new fevers and borderline low trough  Plan:  Will recheck vanc trough and SCr tomorrow AM before 4th dose (not at steady state, but concern for new AKI with low UOP).  Encourage better PO fluid intake   Height: 6\' 2"  (188 cm) Weight: 238 lb 12.1 oz (108.3 kg) IBW/kg (Calculated) : 82.2  Temp (24hrs), Avg:98.6 F (37 C), Min:98.2 F (36.8 C), Max:99.3 F (37.4 C)   Recent Labs Lab 03/22/15 2145 03/23/15 0340 03/24/15 0330 03/26/15 0508 03/26/15 0921 03/27/15 0544  WBC  --  15.5* 15.0* 13.2*  --   --   CREATININE  --  0.77 0.73 1.12  --  1.07  VANCOTROUGH 10  --   --   --  14  --     Estimated Creatinine Clearance: 84.1 mL/min (by C-G formula based on Cr of 1.07).    Allergies  Allergen Reactions  . Morphine And Related Nausea And Vomiting  . Oxycontin [Oxycodone Hcl] Swelling    Lips swells. Tolerates immediate-release oxycodone as well as hydrocodone.  . Quinine Other (See Comments)    Platelets dropped  . Voltaren [Diclofenac Sodium] Other (See Comments)    Elevated liver enzymes    Antimicrobials this admission: 1/27 >> ertapenem (MD) >> 1/28 >> vanc >> 1/29 >> diflucan (MD) >> 2/1  Dose adjustments this admission: 1/30: 2130 VT = 10 on 750 q12h, increase to 1g q12h 2/3: 0930 VT = 14 on 1g q12 (last dose given 1 hr early) 2/4: increased to 1250 q12 since new fevers and VT not true 15-20  Microbiology results: 1/28 MRSA PCR (+) 1/28 abscess cx: abundant CoNS, final 1/28 blood x 2: ngtd  Today, 03/28/2015: Temp: new fevers 2/3, temps improved since  WBC: elevated but improved as of 2/3 Renal: SCr bumped 2/3; improved yesterday. UOP remains low (RN notes reduced liquid intake); CrCl 84 CG, 65 N   Thank you for  allowing pharmacy to be a part of this patient's care.  Reuel Boom, PharmD, BCPS Pager: [phone] 03/28/2015, 11:10 AM

## 2015-03-28 NOTE — NC FL2 (Signed)
[place] LEVEL OF CARE SCREENING TOOL     IDENTIFICATION  Patient Name: Derek [name]. Birthdate: 07-Sep-1945 Sex: male Admission Date (Current Location): 03/18/2015  [facility] and [place] Number:  Herbalist and Address:  [facility],  [place], [place]      Provider Number: [id number]  Attending Physician Name and Address:  Armandina Gemma, MD  Relative Name and Phone Number:       Current Level of Care: Hospital Recommended Level of Care: [place] Prior Approval Number:    Date Approved/Denied:   PASRR Number:    Discharge Plan: SNF    Current Diagnoses: Patient Active Problem List   Diagnosis Date Noted  . Intra-abdominal abscess ([place])   . Abdominal pain 03/18/2015  . Poorly controlled type 2 diabetes mellitus ([place])   . Acute respiratory acidosis   . 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 Hyponatremia 06/03/2013  . OA (osteoarthritis) of knee 06/02/2013  . Anal fissure 02/04/2013  . Meniere's disease   . GERD (gastroesophageal reflux disease)   . Kidney stone   . Hypertension   . Obstructive sleep apnea   . Morbid obesity ([place])   . Synovitis of knee 03/01/2012  . Chronic cough 11/03/2011  . COSTOCHONDRITIS, LEFT 10/08/2009  . RIB PAIN, LEFT SIDED 10/08/2009    Orientation RESPIRATION BLADDER Height & Weight     Self, Time, Situation, Place  Normal Continent Weight: 238 lb 12.1 oz (108.3 kg) Height:  '6\' 2"'$  (188 cm)  BEHAVIORAL SYMPTOMS/MOOD NEUROLOGICAL BOWEL NUTRITION STATUS   (n/a)  (NONE) Continent Diet (Diet Regular)  AMBULATORY STATUS COMMUNICATION OF NEEDS Skin   Extensive Assist Verbally Other (Comment) (S/p perc drain X 2 on 1/28)                       Personal Care Assistance Level of Assistance  Bathing, Feeding, Dressing Bathing Assistance: Maximum assistance (mod to +2 assist) Feeding assistance:  Independent (supervision) Dressing Assistance: Maximum assistance (mod to +2 assist)     Functional Limitations Info  Sight, Hearing, Speech Sight Info: Adequate Hearing Info: Impaired Speech Info: Adequate    SPECIAL CARE FACTORS FREQUENCY  PT (By licensed PT), OT (By licensed OT)     PT Frequency: 5 x a week OT Frequency: 5 x a week            Contractures Contractures Info: Not present    Additional Factors Info  Code Status, Allergies, Isolation Precautions, Insulin Sliding Scale Code Status Info: FULL code status Allergies Info: Morphine And Related, Oxycontin, Quinine, Voltaren     Isolation Precautions Info: Contact Precautions: 1.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 0.9 %  sodium chloride infusion   Intravenous Continuous Armandina Gemma, MD 100 mL/hr at 03/26/15 2200    . acetaminophen (TYLENOL) tablet 650 mg  650 mg Oral Q4H PRN Armandina Gemma, MD      . aspirin tablet 325 mg  325 mg Oral Daily Armandina Gemma, MD   325 mg at 03/28/15 1049  . ertapenem (INVANZ) 1 g in sodium chloride 0.9 % 50 mL IVPB  1 g Intravenous Q24H Armandina Gemma, MD   1 g at 03/27/15 1654  . feeding supplement ([place]) ([place]  SHAKE) liquid 237 mL  237 mL Oral TID BM Eugenie Filler, MD   237 mL at 03/28/15 1000  . heparin injection 5,000 Units  5,000 Units Subcutaneous 3 times per day Alphonsa Overall, MD   5,000 Units at 03/28/15 0520  . HYDROcodone-acetaminophen (NORCO/VICODIN) 5-325 MG per tablet 1-2 tablet  1-2 tablet Oral Q4H PRN Armandina Gemma, MD   2 tablet at 03/27/15 2017  . insulin aspart (novoLOG) injection 0-15 Units  0-15 Units Subcutaneous 6 times per day Armandina Gemma, MD   2 Units at 03/28/15 0813  . magic mouthwash w/lidocaine  15 mL Oral QID Armandina Gemma, MD   15 mL at 03/28/15 1047  . ondansetron  (ZOFRAN-ODT) disintegrating tablet 4 mg  4 mg Oral Q6H PRN Armandina Gemma, MD       Or  . ondansetron [facility]) injection 4 mg  4 mg Intravenous Q6H PRN Armandina Gemma, MD   4 mg at 03/24/15 1840  . pantoprazole (PROTONIX) EC tablet 40 mg  40 mg Oral QHS Polly Cobia, RPH   40 mg at 03/27/15 2207  . traMADol (ULTRAM) tablet 50 mg  50 mg Oral Q6H Stark Klein, MD   50 mg at 03/28/15 0520  . vancomycin (VANCOCIN) 1,250 mg in sodium chloride 0.9 % 250 mL IVPB  1,250 mg Intravenous Q12H Polly Cobia, RPH   1,250 mg at 03/28/15 [id number]     Discharge Medications: Please see discharge summary for a list of discharge medications.  Relevant Imaging Results:  Relevant Lab Results:   Additional Information SSN: [id number]  Dulcy Fanny, LCSW

## 2015-03-29 ENCOUNTER — Encounter (HOSPITAL_COMMUNITY): Payer: Self-pay | Admitting: Radiology

## 2015-03-29 ENCOUNTER — Inpatient Hospital Stay (HOSPITAL_COMMUNITY): Payer: PPO

## 2015-03-29 DIAGNOSIS — R5381 Other malaise: Secondary | ICD-10-CM | POA: Insufficient documentation

## 2015-03-29 DIAGNOSIS — D75839 Thrombocytosis, unspecified: Secondary | ICD-10-CM | POA: Insufficient documentation

## 2015-03-29 DIAGNOSIS — D72829 Elevated white blood cell count, unspecified: Secondary | ICD-10-CM

## 2015-03-29 DIAGNOSIS — D473 Essential (hemorrhagic) thrombocythemia: Secondary | ICD-10-CM

## 2015-03-29 DIAGNOSIS — E1165 Type 2 diabetes mellitus with hyperglycemia: Secondary | ICD-10-CM

## 2015-03-29 DIAGNOSIS — E871 Hypo-osmolality and hyponatremia: Secondary | ICD-10-CM

## 2015-03-29 DIAGNOSIS — I10 Essential (primary) hypertension: Secondary | ICD-10-CM

## 2015-03-29 LAB — VANCOMYCIN, TROUGH: Vancomycin Tr: 21 ug/mL — ABNORMAL HIGH (ref 10.0–20.0)

## 2015-03-29 LAB — BASIC METABOLIC PANEL
Anion gap: 10 (ref 5–15)
BUN: 11 mg/dL (ref 6–20)
CO2: 23 mmol/L (ref 22–32)
Calcium: 9.8 mg/dL (ref 8.9–10.3)
Chloride: 100 mmol/L — ABNORMAL LOW (ref 101–111)
Creatinine, Ser: 1.04 mg/dL (ref 0.61–1.24)
GFR calc Af Amer: >60 mL/min (ref >60)
GFR calc non Af Amer: >60 mL/min (ref >60)
Glucose, Bld: 137 mg/dL — ABNORMAL HIGH (ref 65–99)
Potassium: 3.7 mmol/L (ref 3.5–5.1)
Sodium: 133 mmol/L — ABNORMAL LOW (ref 135–145)

## 2015-03-29 LAB — GLUCOSE, CAPILLARY
Glucose-Capillary: 127 mg/dL — ABNORMAL HIGH (ref 65–99)
Glucose-Capillary: 154 mg/dL — ABNORMAL HIGH (ref 65–99)
Glucose-Capillary: 169 mg/dL — ABNORMAL HIGH (ref 65–99)
Glucose-Capillary: 131 mg/dL — ABNORMAL HIGH (ref 65–99)

## 2015-03-29 LAB — AMYLASE, BODY FLUID
Amylase, Fluid: 54 U/L
Amylase, Fluid: >10000 U/L

## 2015-03-29 MED ORDER — IOHEXOL 300 MG/ML  SOLN
100.0000 mL | Freq: Once | INTRAMUSCULAR | Status: AC | PRN
  Administered 2015-03-29: 100 mL via INTRAVENOUS

## 2015-03-29 MED ORDER — VANCOMYCIN HCL IN DEXTROSE 1-5 GM/200ML-% IV SOLN
1000.0000 mg | Freq: Two times a day (BID) | INTRAVENOUS | Status: DC
  Administered 2015-03-29 – 2015-04-02 (×9): 1000 mg via INTRAVENOUS
  Filled 2015-03-29 (×8): qty 200

## 2015-03-29 NOTE — Progress Notes (Signed)
Patient ID: Derek Mohr Sr., male   DOB: 01-09-1946, 70 y.o.   MRN: [id number]    Referring Physician(s): CCS  Chief Complaint: Abdominal abscesses   Subjective: Pt "sick of being sick"; has worked some with PT today   Allergies: Morphine and related; Oxycontin; Quinine; and Voltaren  Medications: Prior to Admission medications   Medication Sig Start Date End Date Taking? Authorizing Provider  carvedilol (COREG) 25 MG tablet Take 25 mg by mouth 2 (two) times daily with a meal.  09/26/11  Yes Historical Provider, MD  [employer] TRIPLE MAGNESIUM COMPLEX PO Take 1-2 capsules by mouth 2 (two) times daily. Two capsules in the morning and one at night   Yes Historical Provider, MD  diphenhydrAMINE (BENADRYL) 25 mg capsule Take 50 mg by mouth 2 (two) times daily. Reported on 03/17/2015   Yes Historical Provider, MD  HYDROmorphone (DILAUDID) 2 MG tablet Take 1 tablet (2 mg total) by mouth every 3 (three) hours as needed for moderate pain or severe pain. 03/03/15  Yes Armandina Gemma, MD  insulin aspart (NOVOLOG) 100 UNIT/ML injection Inject 0-9 Units into the skin 3 (three) times daily before meals. 03/03/15  Yes Armandina Gemma, MD  insulin glargine (LANTUS) 100 UNIT/ML injection Inject 0.2 mLs (20 Units total) into the skin daily. 03/03/15  Yes Armandina Gemma, MD  losartan (COZAAR) 50 MG tablet Take 50 mg by mouth daily. 11/06/14  Yes Historical Provider, MD  naproxen sodium (ALEVE) 220 MG tablet Take 440 mg by mouth 2 (two) times daily with a meal.    Yes Historical Provider, MD  aspirin EC 81 MG tablet Take 81 mg by mouth at bedtime.    Historical Provider, MD  clobetasol ointment (TEMOVATE) [id number] % Apply 1 application topically daily as needed (for peeling skin on hands and feet). Reported on 03/17/2015    Historical Provider, MD  Insulin Syringes, Disposable, U-100 0.5 ML MISC 20 Units by Does not apply route daily. 03/03/15   Armandina Gemma, MD  lansoprazole (PREVACID) 15 MG capsule Take 30 mg by mouth daily as  needed (reflux). Reported on 03/17/2015    Historical Provider, MD  [facility] DELICA LANCETS [id number] MISC 1 Device by Does not apply route 4 (four) times daily. 03/17/15   Renato Shin, MD     Vital Signs: BP 157/73 mmHg  Pulse 91  Temp(Src) 99.2 F (37.3 C) (Oral)  Resp 20  Ht 6\' 2"  (1.88 m)  Wt 238 lb 12.1 oz (108.3 kg)  BMI 30.64 kg/m2  SpO2 95%  Physical Exam awake/alert; appears frustrated with lack of progress; left abd drains intact, outputs - 20 cc left post lat drain, 85 cc left medial drain turbid beige fluid; fluid cx's - staph; insertion sites ok, mildly tender  Imaging: Ct Abdomen Pelvis W Contrast  03/29/2015  CLINICAL DATA:  One month post distal pancreatectomy and splenectomy for neuroendocrine tumor of the pancreas, fluid collections post percutaneous drainage EXAM: CT ABDOMEN AND PELVIS WITH CONTRAST TECHNIQUE: Multidetector CT imaging of the abdomen and pelvis was performed using the standard protocol following bolus administration of intravenous contrast. Sagittal and coronal MPR images reconstructed from axial data set. CONTRAST:  138mL OMNIPAQUE IOHEXOL 300 MG/ML SOLN IV. Dilute oral contrast. COMPARISON:  03/20/2015, 03/19/2015 FINDINGS: Loculated pleural fluid collection at LEFT lung base, new, 15 x 7 cm. Associated compressive atelectasis of LEFT lower lobe. Minimal dependent atelectasis RIGHT lower lobe. Tiny LEFT renal cyst stable. Liver, kidneys, and adrenal glands otherwise normal. Post distal pancreatectomy and  splenectomy with residual pancreas normal appearance. Significantly decreased size of fluid collection at the pancreatic bed post percutaneous drainage, with only a small amount of fluid identified near the pancreatic stump. Large gas and fluid collection in the in lateral LEFT mid abdomen measures 10.5 x 8.2 x 8.7 cm, mildly decreased in size since the previous exam but demonstrating less significant response to percutaneous drainage. Percutaneous drainage catheter  remains at lateral aspect of this collection, toward superior end. No new intra-abdominal or intrapelvic fluid collections. Few scattered normal size mesenteric and retroperitoneal lymph nodes without discrete adenopathy. Moderate-sized hiatal hernia. Stomach and bowel loops otherwise normal appearance. Large RIGHT posterior bladder diverticulum, bladder and ureters otherwise normal. LEFT inguinal hernia containing fat. Tiny focus of gas within the LEFT rectus sheath image 46 not definitely seen previously. No free intraperitoneal air or acute osseous findings. IMPRESSION: Decrease in size of postoperative fluid collection at the bed of the pancreatic tail post percutaneous drainage. Mild decrease in size of additional LEFT upper quadrant postoperative collection though demonstrating a elss significant response than the pancreatic tail collection. New loculated LEFT pleural effusion. Single tiny focus of nonspecific gas within the LEFT rectus sheath; potential this could be related to interval percutaneous drainage procedure, recommend attention on follow-up imaging. Electronically Signed   By: Lavonia Dana M.D.   On: 03/29/2015 09:47    Labs:  CBC:  Recent Labs  03/21/15 0138 03/23/15 0340 03/24/15 0330 03/26/15 0508  WBC 20.7* 15.5* 15.0* 13.2*  HGB 10.3* 9.7* 10.1* 9.4*  HCT 33.8* 31.5* 33.2* 30.8*  PLT 892* 898* 992* 936*    COAGS:  Recent Labs  02/19/15 0920 03/18/15 1859  INR 1.07 1.33  APTT 28  --     BMP:  Recent Labs  03/24/15 0330 03/26/15 0508 03/27/15 0544 03/29/15 0456  NA 132* 132* 130* 133*  K 4.2 4.2 3.9 3.7  CL 95* 98* 98* 100*  CO2 27 26 22 23   GLUCOSE 125* 168* 154* 137*  BUN 7 14 12 11   CALCIUM 10.4* 10.4* 9.8 9.8  CREATININE 0.73 1.12 1.07 1.04  GFRNONAA >60 >60 >60 >60  GFRAA >60 >60 >60 >60    LIVER FUNCTION TESTS:  Recent Labs  02/19/15 0920 03/18/15 1859 03/23/15 0340  BILITOT 0.6 0.7 0.9  AST 19 34 29  ALT 23 31 23   ALKPHOS 83 147*  155*  PROT 6.9 7.1 6.5  ALBUMIN 4.2 2.7* 2.2*    Assessment and Plan: S/p distal pancreatectomy and splenectomy, now with postoperative fluid collections and pseudocyst formation; s/p left abd drain placement x2 by Dr. Annamaria Boots on 1/28; AF; f/u CT A/P reviewed today by Dr. Kathlene Cote- decreased but persistent fluid components of drained collections -- rec cont drainage/irrigations of drains; if output from left post lat drain cont to decline could consider removing in few days but as long as sig output is present drains should remain in place   Electronically Signed: D. Rowe Robert 03/29/2015, 2:16 PM   I spent a total of 15 minutes at the the patient's bedside AND on the patient's hospital floor or unit, greater than 50% of which was counseling/coordinating care for left abdominal abscess drains

## 2015-03-29 NOTE — Progress Notes (Addendum)
Physical Therapy Treatment Patient Details Name: Derek SCHOFF Sr. MRN: [id number] DOB: 1945-04-13 Today's Date: 03/29/2015    History of Present Illness 70yo male who is s/p pancreatectomy 2* cancer now readmitted with abscess. H/o B TKA, HTN, DM, Meniere's disease (with falls), obesity, anxiety    PT Comments    Assisted pt to EOB when MD arrived.  Sat EOB x 7 min Indep.  Assisted with amb but only able to get to door.  Required extended rest break.  Amb again in hallway when spouse arrived.  Returned to room then assisted on/off BSC.  Positioned in recliner.  Extended Tx session due to multiple rest breaks between each activity.    Follow Up Recommendations  Pt will need ST Rehab at SNF or CIR if appropriate prior to returning home to regain prior level of mobility and strength     Equipment Recommendations  Rolling walker with 5" wheels    Recommendations for Other Services       Precautions / Restrictions Precautions Precautions: Fall Precaution Comments: drains L side. Hx of Meniere's disease Restrictions Weight Bearing Restrictions: No RLE Weight Bearing: Weight bearing as tolerated    Mobility  Bed Mobility Overal bed mobility: Needs Assistance Bed Mobility: Supine to Sit     Supine to sit: Mod assist     General bed mobility comments: Assist for trunk and LEs. Increased time.  Once EOB pt able to sit Indep   Transfers Overall transfer level: Needs assistance Equipment used: Rolling walker (2 wheeled) Transfers: Sit to/from Stand Sit to Stand: Min assist;Mod assist         General transfer comment: requires increased assist with each performance.  50% VC's on proper hand placement to power up and to controll decend.  Ambulation/Gait Ambulation/Gait assistance: Min assist;+2 safety/equipment Ambulation Distance (Feet): 35 Feet Assistive device: Rolling walker (2 wheeled) Gait Pattern/deviations: Step-to pattern;Step-through pattern;Decreased step length  - right;Decreased step length - left;Decreased stance time - right;Decreased stance time - left;Trunk flexed;Steppage Gait velocity: decreased   General Gait Details: amb twice to achieve total distance.  Very slow gait with max c/o fatigue (no energy)   Stairs            Wheelchair Mobility    Modified Rankin (Stroke Patients Only)       Balance                                    Cognition Arousal/Alertness: Awake/alert Behavior During Therapy: WFL for tasks assessed/performed Overall Cognitive Status: Within Functional Limits for tasks assessed                      Exercises      General Comments        Pertinent Vitals/Pain Pain Assessment: Faces Faces Pain Scale: Hurts little more Pain Location: low back Pain Descriptors / Indicators: Sharp Pain Intervention(s): Monitored during session;Repositioned    Home Living                      Prior Function            PT Goals (current goals can now be found in the care plan section) Progress towards PT goals: Progressing toward goals    Frequency  Min 3X/week    PT Plan Current plan remains appropriate    Co-evaluation  End of Session Equipment Utilized During Treatment: Gait belt Activity Tolerance: Treatment limited secondary to medical complications (Comment) (poor po intake and medical) Patient left: in chair;with call bell/phone within reach;with family/visitor present     Time: 1405-1500 PT Time Calculation (min) (ACUTE ONLY): 55 min  Charges:  $Gait Training: 23-37 mins $Therapeutic Activity: 23-37 mins                    G Codes:      Rica Koyanagi  PTA [facility]  Acute  Rehab Pager      [phone]

## 2015-03-29 NOTE — Consult Note (Signed)
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 Harlow Asa   HPI: Derek Mohr Sr. is a 70 y.o. right handed male with history of hypertension, diabetes mellitus. Patient with recently diagnosed pancreatic tail cancer and underwent laparotomy with distal pancreatectomy and splenectomy by Dr.Gerkin approximately 3 weeks ago and was discharged to home. Patient lives with spouse independent up until a few weeks ago. Two-level home he is able stay on the main level 4 steps to entry of home. Wife works but plans to retire soon. Developed progressive left upper quadrant abdominal pain, nausea emesis and worsening back pain. He was seen in the office of general surgery and admitted 03/19/2015 for further evaluation. CT scan abdomen and pelvis revealed 2 large intra-abdominal fluid collections. Underwent CT-guided drainage of intra-abdominal abscesses 03/20/2015 prior vaginal radiology. Placed on broad-spectrum antibiotics. Patient developed mental status changes, fever, and respiratory difficulties was transferred to stepdown unit. Mental status improved after dose of Narcan. His diet was slowly advanced. Wound culture showed staph continued on antibiotic therapy. CT abdomen and pelvis showing improvement in pancreas, however, new left pleural effusion and gas within the rectus sheath.  Subcutaneous heparin added for DVT prophylaxis. Contact precautions MRSA PCR positive. Physical therapy evaluation completed 03/24/2015 with slow progressive gains. M.D. has requested physical medicine rehabilitation consult.   Review of Systems  Constitutional: Negative for fever.  HENT: Positive for hearing loss.   Eyes: Negative for blurred vision and double vision.  Respiratory: Negative for cough.   Cardiovascular: Negative for chest pain and palpitations.  Gastrointestinal:  Positive for nausea, vomiting, abdominal pain and constipation.       GERD  Genitourinary: Negative for dysuria and hematuria.  Musculoskeletal: Positive for myalgias and joint pain.  Skin: Negative for rash.  Neurological: Positive for weakness. Negative for headaches.  Psychiatric/Behavioral: Positive for depression.       Anxiety with panic attacks  All other systems reviewed and are negative.  Past Medical History  Diagnosis Date  . Hypertension   . Diabetes mellitus   . GERD (gastroesophageal reflux disease)   . Meniere's disease   . Morbid obesity ([place])   . Depression   . Arthritis   . 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 Anxiety     prone to "panic attacks"  . 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 History of kidney stones     multiple kidney stones-not a problem at present  . Kidney stone   . Dermatitis     bilateral hands  . BPH (benign prostatic hyperplasia)   . Shortness of breath dyspnea   . 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 DDD (degenerative disc disease), lumbar     gets back injections   . 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 Straining on urination   . Obstructive sleep apnea     " i used to have sleep apnea" never used a c-pap   . Cancer [place])     newly diagnosed pancreatic cancer   Past Surgical History  Procedure Laterality Date  . Knee arthroscopy Right 2002    rt knee  . Rotator cuff repair  '93bilateral '94 lt    x3  . Umbilical hernia repair  2000  . Knee arthroscopy  03/01/2012    Procedure: ARTHROSCOPY  KNEE;  Surgeon: Gearlean Alf, MD;  Location: [facility];  Service: Orthopedics;  Laterality: Left;  WITH SYNOVECTOMY   . Ankle surgery Left 2010    left-fx- some retained hardware as of 05-22-13  . Hardware removal Left 2013    ankle-lt  . Anal fistulectomy N/A 04/01/2013    Procedure: EXAM UNDER ANESTHESIA WITH ANAL FISSURectomy, sphincterotomy and  internal hemorrhoidectomy;  Surgeon: Harl Bowie, MD;  Location: [place];  Service: General;  Laterality: N/A;  . Replacement total knee Left 2009    knee-left  . Total knee arthroplasty Right 06/02/2013    Procedure: RIGHT TOTAL KNEE ARTHROPLASTY;  Surgeon: Gearlean Alf, MD;  Location: [facility];  Service: Orthopedics;  Laterality: Right;  . Colonoscopy    . Upper gastrointestinal endoscopy  sept 2016  . Inguinal hernia repair  '74,'82    x 2, inguinal  . Eus N/A 12/31/2014    Procedure: UPPER ENDOSCOPIC ULTRASOUND (EUS) LINEAR;  Surgeon: Milus Banister, MD;  Location: [facility];  Service: Endoscopy;  Laterality: N/A;   Family History  Problem Relation Age of Onset  . Factor V Leiden deficiency Daughter   . Diabetes Maternal Grandmother   . Heart disease Paternal Uncle   . Bone cancer Father     Jaw  . Colon cancer Neg Hx   . Esophageal cancer Neg Hx   . Stomach cancer Neg Hx   . Rectal cancer Neg Hx    Social History:  reports that he quit smoking about 40 years ago. His smoking use included Cigarettes. He has a 68 pack-year smoking history. He has never used smokeless tobacco. He reports that he does not drink alcohol or use illicit drugs. Allergies:  Allergies  Allergen Reactions  . Morphine And Related Nausea And Vomiting  . Oxycontin [Oxycodone Hcl] Swelling    Lips swells. Tolerates immediate-release oxycodone as well as hydrocodone.  . Quinine Other (See Comments)    Platelets dropped  . Voltaren [Diclofenac Sodium] Other (See Comments)    Elevated liver enzymes   Medications Prior to Admission  Medication Sig Dispense Refill  . carvedilol (COREG) 25 MG tablet Take 25 mg by mouth 2 (two) times daily with a meal.     . [employer] TRIPLE MAGNESIUM COMPLEX PO Take 1-2 capsules by mouth 2 (two) times daily. Two capsules in the morning and one at night    . diphenhydrAMINE (BENADRYL) 25 mg capsule Take 50 mg by mouth 2 (two) times daily. Reported on  03/17/2015    . HYDROmorphone (DILAUDID) 2 MG tablet Take 1 tablet (2 mg total) by mouth every 3 (three) hours as needed for moderate pain or severe pain. 30 tablet 0  . insulin aspart (NOVOLOG) 100 UNIT/ML injection Inject 0-9 Units into the skin 3 (three) times daily before meals. 10 mL 11  . insulin glargine (LANTUS) 100 UNIT/ML injection Inject 0.2 mLs (20 Units total) into the skin daily. 10 mL 11  . losartan (COZAAR) 50 MG tablet Take 50 mg by mouth daily.  4  . naproxen sodium (ALEVE) 220 MG tablet Take 440 mg by mouth 2 (two) times daily with a meal.     . aspirin EC 81 MG tablet Take 81 mg by mouth at bedtime.    . clobetasol ointment (TEMOVATE) [id number] % Apply 1 application topically daily as needed (for peeling skin on hands and feet). Reported on 03/17/2015    . Insulin Syringes, Disposable, U-100 0.5 ML  MISC 20 Units by Does not apply route daily. 100 each 1  . lansoprazole (PREVACID) 15 MG capsule Take 30 mg by mouth daily as needed (reflux). Reported on 03/17/2015    . ONETOUCH DELICA LANCETS [id number] MISC 1 Device by Does not apply route 4 (four) times daily. 120 each 11    Home: Home Living Family/patient expects to be discharged to:: Private residence Living Arrangements: Spouse/significant other Available Help at Discharge: Family, Available PRN/intermittently Type of Home: House Home Access: Stairs to enter [employer] of Steps: 4 Entrance Stairs-Rails: Can reach both, Left, Right Home Layout: Two level, Able to live on main level with bedroom/bathroom Bathroom Shower/Tub: Tub/shower unit Bathroom Toilet: Handicapped height Home Equipment: Environmental consultant - 2 wheels, Cane - single point, Bedside commode (cbg meter) Additional Comments: wife works but will retire soon  Functional History: Prior Function Level of Independence: Independent Functional Status:  Mobility: Bed Mobility Overal bed mobility: Needs Assistance Bed Mobility: Supine to Sit Supine to sit: Mod  assist Sit to supine: Mod assist, HOB elevated General bed mobility comments: Assist for trunk and LEs. Increased time.  Transfers Overall transfer level: Needs assistance Equipment used: Rolling walker (2 wheeled) Transfers: Sit to/from Stand Sit to Stand: Mod assist, +2 safety/equipment General transfer comment: Assist to rise, stabilize, control descent, UE placement. VCs safety, hand placement Ambulation/Gait Ambulation/Gait assistance: Min assist, +2 physical assistance, +2 safety/equipment Ambulation Distance (Feet): 8 Feet Assistive device: Rolling walker (2 wheeled) Gait Pattern/deviations: Trunk flexed, Decreased stride length, Step-through pattern General Gait Details: decreased amb distance due to increased c/o fatigue from using BSC and MAX c/o low back pain.  Very slow gait.  Gait velocity: decreased Gait velocity interpretation: Below normal speed for age/gender    ADL:    Cognition: Cognition Overall Cognitive Status: Within Functional Limits for tasks assessed Orientation Level: Oriented to person, Oriented to place, Oriented to situation, Disoriented to time Cognition Arousal/Alertness: Awake/alert Behavior During Therapy: WFL for tasks assessed/performed Overall Cognitive Status: Within Functional Limits for tasks assessed  Blood pressure 156/72, pulse 87, temperature 98.8 F (37.1 C), temperature source Oral, resp. rate 20, height 6\' 2"  (1.88 m), weight 108.3 kg (238 lb 12.1 oz), SpO2 96 %. Physical Exam  Vitals reviewed. Constitutional: He is oriented to person, place, and time. He appears well-developed and well-nourished.  HENT:  Head: Normocephalic and atraumatic.  Eyes: Conjunctivae and EOM are normal.  Neck: Normal range of motion. Neck supple. No thyromegaly present.  Cardiovascular: Normal rate and regular rhythm.   Respiratory: Effort normal and breath sounds normal. No respiratory distress.  GI: Soft. Bowel sounds are normal. He exhibits no  distension.  Genitourinary: Penis normal.  Musculoskeletal: He exhibits no edema or tenderness.  Neurological: He is alert and oriented to person, place, and time.  Follows simple commands Sensation diminished to light touch right lateral thigh DTRs symmetric Motor: 4-/5 throughout  Skin: Skin is warm and dry.  Psychiatric: He has a normal mood and affect. His behavior is normal.    Results for orders placed or performed during the hospital encounter of 03/18/15 (from the past 24 hour(s))  Glucose, capillary     Status: Abnormal   Collection Time: 03/28/15  8:00 AM  Result Value Ref Range   Glucose-Capillary 125 (H) 65 - 99 mg/dL  Glucose, capillary     Status: Abnormal   Collection Time: 03/28/15 11:45 AM  Result Value Ref Range   Glucose-Capillary 160 (H) 65 - 99 mg/dL  Glucose, capillary  Status: Abnormal   Collection Time: 03/28/15  4:49 PM  Result Value Ref Range   Glucose-Capillary 185 (H) 65 - 99 mg/dL  Glucose, capillary     Status: Abnormal   Collection Time: 03/28/15 11:17 PM  Result Value Ref Range   Glucose-Capillary 134 (H) 65 - 99 mg/dL   Comment 1 Notify RN    Comment 2 Document in Chart   Vancomycin, trough     Status: Abnormal   Collection Time: 03/29/15  4:56 AM  Result Value Ref Range   Vancomycin Tr 21 (H) 10.0 - 20.0 ug/mL  Basic metabolic panel     Status: Abnormal   Collection Time: 03/29/15  4:56 AM  Result Value Ref Range   Sodium 133 (L) 135 - 145 mmol/L   Potassium 3.7 3.5 - 5.1 mmol/L   Chloride 100 (L) 101 - 111 mmol/L   CO2 23 22 - 32 mmol/L   Glucose, Bld 137 (H) 65 - 99 mg/dL   BUN 11 6 - 20 mg/dL   Creatinine, Ser 1.04 0.61 - 1.24 mg/dL   Calcium 9.8 8.9 - 10.3 mg/dL   GFR calc non Af Amer >60 >60 mL/min   GFR calc Af Amer >60 >60 mL/min   Anion gap 10 5 - 15   No results found.  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  Records reviewed and summated above.  1. Does the need for close, 24 hr/day medical supervision in concert with the patient's rehab needs make it unreasonable for this patient to be served in a less intensive setting? Potentially 2. Co-Morbidities requiring supervision/potential complications: HTN (monitor and provide prns in accordance with increased physical exertion and pain), DM (Monitor in accordance with exercise and adjust meds as necessary), leukocytosis (treding down, cont to monitor and consider further workup if necessary), thrombocytosis (cont to monitor in light of recent splenectomy), hyponatremia (cont to monitor and replete if necessary) 3. Due to safety, skin/wound care, disease management, medication administration, pain management and patient education, does the patient require 24 hr/day rehab nursing? Yes 4. Does the patient require coordinated care of a physician, rehab nurse, PT (1-2 hrs/day, 5 days/week) and OT (1-2 hrs/day, 5 days/week) to address physical and functional deficits in the context of the above medical diagnosis(es)? Potentially Addressing deficits in the following areas: balance, endurance, locomotion, strength, transferring, dressing, toileting and psychosocial support 5. Can the patient actively participate in an intensive therapy program of at least 3 hrs of therapy per day at least 5 days per week? Yes 6. The potential for patient to make measurable gains while on inpatient rehab is excellent 7. Anticipated functional outcomes upon discharge from inpatient rehab are supervision and min assist  with PT, supervision and min assist with OT, supervision and min assist with SLP. 8. Estimated rehab length of stay to reach the above functional goals is: 13-16 days. 9. Does the patient have adequate social supports and living environment to accommodate these discharge functional goals? Potentially 10. Anticipated D/C setting: Other 11. Anticipated post D/C  treatments: SNF 12. Overall Rehab/Functional Prognosis: good and fair  RECOMMENDATIONS: This patient's condition is appropriate for continued rehabilitative care in the following setting: Will need to clairfy support at discharge, but if wife is unable to provide physical support at discharge, the most appropriate setting will likely be SNF.  Patient has agreed to participate in recommended program. Yes Note that insurance prior authorization may be  required for reimbursement for recommended care.  Comment: Rehab Admissions Coordinator to follow up.  Delice Lesch, MD 03/29/2015

## 2015-03-29 NOTE — Progress Notes (Signed)
Patient ID: Derek Mohr Sr., male   DOB: Mar 26, 1945, 70 y.o.   MRN: [id number]  [facility]  Subjective: Patient in bed, wanting to sleep.  Tired after being up to chair and ambulating in halls.  CT scan this AM.  Objective: Vital signs in last 24 hours: Temp:  [98.8 F (37.1 C)-99.2 F (37.3 C)] 99.2 F (37.3 C) (02/06 1401) Pulse Rate:  [87-91] 91 (02/06 1401) Resp:  [20] 20 (02/06 1401) BP: (156-161)/(69-73) 157/73 mmHg (02/06 1401) SpO2:  [93 %-96 %] 95 % (02/06 1401) Last BM Date: 03/26/15  Intake/Output from previous day: 02/05 0701 - 02/06 0700 In: 1369.3 [I.V.:1099.3; IV Piggyback:250] Out: [id number] [Urine:1350; Drains:105; Stool:1] Intake/Output this shift:    Physical Exam: HEENT - sclerae clear, mucous membranes moist Neck - soft Chest - clear bilaterally Cor - RRR Abdomen - soft; drains left upper quad with creamy tan fluid Ext - no edema, non-tender Neuro - somnolent  Lab Results:  No results for input(s): WBC, HGB, HCT, PLT in the last 72 hours. BMET  Recent Labs  03/27/15 0544 03/29/15 0456  NA 130* 133*  K 3.9 3.7  CL 98* 100*  CO2 22 23  GLUCOSE 154* 137*  BUN 12 11  CREATININE 1.07 1.04  CALCIUM 9.8 9.8   PT/INR No results for input(s): LABPROT, INR in the last 72 hours. Comprehensive Metabolic Panel:    Component Value Date/Time   NA 133* 03/29/2015 0456   NA 130* 03/27/2015 0544   K 3.7 03/29/2015 0456   K 3.9 03/27/2015 0544   CL 100* 03/29/2015 0456   CL 98* 03/27/2015 0544   CO2 23 03/29/2015 0456   CO2 22 03/27/2015 0544   BUN 11 03/29/2015 0456   BUN 12 03/27/2015 0544   CREATININE 1.04 03/29/2015 0456   CREATININE 1.07 03/27/2015 0544   GLUCOSE 137* 03/29/2015 0456   GLUCOSE 154* 03/27/2015 0544   CALCIUM 9.8 03/29/2015 0456   CALCIUM 9.8 03/27/2015 0544   AST 29 03/23/2015 0340   AST 34 03/18/2015 1859   ALT 23 03/23/2015 0340   ALT 31 03/18/2015 1859   ALKPHOS 155* 03/23/2015  0340   ALKPHOS 147* 03/18/2015 1859   BILITOT 0.9 03/23/2015 0340   BILITOT 0.7 03/18/2015 1859   PROT 6.5 03/23/2015 0340   PROT 7.1 03/18/2015 1859   ALBUMIN 2.2* 03/23/2015 0340   ALBUMIN 2.7* 03/18/2015 1859    Studies/Results: Ct Abdomen Pelvis W Contrast  03/29/2015  CLINICAL DATA:  One month post distal pancreatectomy and splenectomy for neuroendocrine tumor of the pancreas, fluid collections post percutaneous drainage EXAM: CT ABDOMEN AND PELVIS WITH CONTRAST TECHNIQUE: Multidetector CT imaging of the abdomen and pelvis was performed using the standard protocol following bolus administration of intravenous contrast. Sagittal and coronal MPR images reconstructed from axial data set. CONTRAST:  173m OMNIPAQUE IOHEXOL 300 MG/ML SOLN IV. Dilute oral contrast. COMPARISON:  03/20/2015, 03/19/2015 FINDINGS: Loculated pleural fluid collection at LEFT lung base, new, 15 x 7 cm. Associated compressive atelectasis of LEFT lower lobe. Minimal dependent atelectasis RIGHT lower lobe. Tiny LEFT renal cyst stable. Liver, kidneys, and adrenal glands otherwise normal. Post distal pancreatectomy and splenectomy with residual pancreas normal appearance. Significantly decreased size of fluid collection at the pancreatic bed post percutaneous drainage, with only a small amount of fluid identified near the pancreatic stump. Large gas and fluid collection in the in lateral LEFT mid abdomen measures 10.5 x 8.2 x 8.7 cm, mildly decreased  in size since the previous exam but demonstrating less significant response to percutaneous drainage. Percutaneous drainage catheter remains at lateral aspect of this collection, toward superior end. No new intra-abdominal or intrapelvic fluid collections. Few scattered normal size mesenteric and retroperitoneal lymph nodes without discrete adenopathy. Moderate-sized hiatal hernia. Stomach and bowel loops otherwise normal appearance. Large RIGHT posterior bladder diverticulum, bladder and  ureters otherwise normal. LEFT inguinal hernia containing fat. Tiny focus of gas within the LEFT rectus sheath image 46 not definitely seen previously. No free intraperitoneal air or acute osseous findings. IMPRESSION: Decrease in size of postoperative fluid collection at the bed of the pancreatic tail post percutaneous drainage. Mild decrease in size of additional LEFT upper quadrant postoperative collection though demonstrating a elss significant response than the pancreatic tail collection. New loculated LEFT pleural effusion. Single tiny focus of nonspecific gas within the LEFT rectus sheath; potential this could be related to interval percutaneous drainage procedure, recommend attention on follow-up imaging. Electronically Signed   By: Lavonia Dana M.D.   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  [name], MD, [facility] Office: [place] 03/29/2015

## 2015-03-29 NOTE — Progress Notes (Addendum)
Nutrition Follow-up  DOCUMENTATION CODES:   Severe malnutrition in context of chronic illness, Obesity unspecified  INTERVENTION:  - Continue Glucerna Shake order - RD will continue to monitor for needs  NUTRITION DIAGNOSIS:   Malnutrition related to chronic illness, cancer and cancer related treatments as evidenced by percent weight loss, energy intake < or equal to 75% for > or equal to 1 month. -improving  GOAL:   Patient will meet greater than or equal to 90% of their needs -unmet  MONITOR:   PO intake, Diet advancement, Supplement acceptance, Weight trends, Labs, Skin, I & O's  ASSESSMENT:   [name] who underwent distal pancreatectomy and splenectomy for neuroendocrine tumor of the pancreas 3 weeks ago. Initially did well after discharge home. Seen by endocrinologist for management of IDDM. Developed progressive LUQ abd pain, nausea, emesis, and worsening back pain. Presented to [place] office yesterday and admitted for further work up and treatment.  2/6 Per chart review, pt has been eating 25-50% of meals since previous assessment. Pt on the phone at time of first attempted visit, sleeping at time of second attempted visit, and working with tech at time of third attempted visit. Order for Glucerna Shake placed at time of last assessment; will continue this order at this time and adjust as needed at follow-up.  Not meeting needs. Medications reviewed. Labs reviewed; CBGs: 93-185 mg/dL, Na: 133 mmol/L, Cl: 100 mmol/L.  ADDENDUM: Pt was on FLD at time of last assessment. Diet was advanced from FLD to Regular on 2/2 @ 1132. Will monitor CBGs for need to switch to Carb Modified diet.    2/1 - No intakes documented with diet advancement.  - Pt with breakfast tray (grits with butter and vanilla pudding) on bedside table and pt reports poor appetite and inability to eat at this time as he is not feeling well.  - He denies pain or nausea at this time but states that he has  feeling of general unwell. - Pt attempting to use the urinal at time of RD visit and physical unable to be done at this time.  - Will order supplement per previous RD.    1/29 - Pt in room asleep with wife at bedside.  - Per pt's wife, pt has had extremely poor appetite and PO intake over the past month and has worsened since distal pancreatectomy 3 weeks ago. - Last time pt consumed solid food was last Wednesday 1/25 which was a few bites of a waffle.  - She states pt would consume 4-5 bites of food prior to surgery and would get full very quickly.  - Pt currently on clear liquids and only consuming ice chips.  - Pt has lost 25-30 lb per wife, weight history shows a 25 lb weight loss since 12/30 (9% wt loss x 1 month, significant for time frame). - When diet is advanced, RD recommends nutritional supplementation such as Glucerna shakes. - Pt asleep and unable to perform full exam. Noticeable depletion in clavicle regions.    Diet Order:  Diet regular Room service appropriate?: Yes; Fluid consistency:: Thin  Skin:  Wound (see comment) (Abdominal incision from 02/25/15)  Last BM:  2/3  Height:   Ht Readings from Last 1 Encounters:  03/20/15 6\' 2"  (1.88 m)    Weight:   Wt Readings from Last 1 Encounters:  03/20/15 238 lb 12.1 oz (108.3 kg)    Ideal Body Weight:  86.4 kg  BMI:  Body mass index is 30.64 kg/(m^2).  Estimated Nutritional Needs:   Kcal:  [id number]  Protein:  120-130g  Fluid:  2.3-2.5 L/day  EDUCATION NEEDS:   No education needs identified at this time     Jarome Matin, RD, LDN Inpatient Clinical Dietitian Pager # [phone] After hours/weekend pager # [phone]

## 2015-03-29 NOTE — Care Management Important Message (Signed)
Important Message  Patient Details IM Letter given to Cookie/Case Manager to present to Patient Name: Derek VARGHESE Sr. MRN: [id number] Date of Birth: 1945-07-27   Medicare Important Message Given:  Yes    [name] 03/29/2015, 11:31 AMImportant Message  Patient Details  Name: Derek SANO Sr. MRN: [id number] Date of Birth: 1945/09/04   Medicare Important Message Given:  Yes    [name] 03/29/2015, 11:31 AM

## 2015-03-29 NOTE — Progress Notes (Signed)
Pharmacy Antibiotic Note  Derek [name]. is a 70 y.o. male admitted on 03/18/2015 on day 10 vancomycin and day 11 ertapenem for abscesses/fluid collections following distal pancreatectomy/splenectomy.  Plan:  Decrease to Vancomycin 1g IV every 12 hours.  Goal trough 15-20 mcg/mL.   Continue Ertapenem per MD  Recheck Vanc trough at steady state.  Follow up renal fxn, culture results, and clinical course.   Height: 6\' 2"  (188 cm) Weight: 238 lb 12.1 oz (108.3 kg) IBW/kg (Calculated) : 82.2  Temp (24hrs), Avg:98.6 F (37 C), Min:98.1 F (36.7 C), Max:99 F (37.2 C)   Recent Labs Lab 03/23/15 0340 03/24/15 0330 03/26/15 0508 03/26/15 0921 03/27/15 0544 03/29/15 0456  WBC 15.5* 15.0* 13.2*  --   --   --   CREATININE 0.77 0.73 1.12  --  1.07 1.04  VANCOTROUGH  --   --   --  14  --  21*    Estimated Creatinine Clearance: 86.6 mL/min (by C-G formula based on Cr of 1.04).    Allergies  Allergen Reactions  . Morphine And Related Nausea And Vomiting  . Oxycontin [Oxycodone Hcl] Swelling    Lips swells. Tolerates immediate-release oxycodone as well as hydrocodone.  . Quinine Other (See Comments)    Platelets dropped  . Voltaren [Diclofenac Sodium] Other (See Comments)    Elevated liver enzymes    Antimicrobials this admission: 1/27 >> ertapenem >> 1/28 >> vanc >> 1/29 >> diflucan >> 2/1  Dose adjustments this admission: 1/30: 2130 VT = 10 on 750 q12h, increase to 1g q12h 2/3: 0930 VT = 14 on 1g q12 (last dose given 1 hr early) 2/4: increased to 1250 q12 since new fevers and VT not true 15-20 2/6: 0500 VT = 21 on 1250 mg q12h (after only 3 doses, SCr improving)  Microbiology results: 1/28 BCx: NGF 1/28 Abscess (panceatic): abundant Coagulase negative staphylococcus 1/28 MRSA PCR: positive  Thank you for allowing pharmacy to be a part of this patient's care.  Gretta Arab PharmD, BCPS Pager [phone] 03/29/2015 7:13 AM

## 2015-03-30 ENCOUNTER — Inpatient Hospital Stay (HOSPITAL_COMMUNITY): Payer: PPO

## 2015-03-30 LAB — CBC WITH DIFFERENTIAL/PLATELET
Basophils Absolute: 0.1 10*3/uL (ref 0.0–0.1)
Basophils Relative: 1 %
Eosinophils Absolute: 0.3 10*3/uL (ref 0.0–0.7)
Eosinophils Relative: 3 %
HCT: 27 % — ABNORMAL LOW (ref 39.0–52.0)
Hemoglobin: 8.4 g/dL — ABNORMAL LOW (ref 13.0–17.0)
Lymphocytes Relative: 10 %
Lymphs Abs: 1.1 10*3/uL (ref 0.7–4.0)
MCH: 23.9 pg — ABNORMAL LOW (ref 26.0–34.0)
MCHC: 31.1 g/dL (ref 30.0–36.0)
MCV: 76.7 fL — ABNORMAL LOW (ref 78.0–100.0)
Monocytes Absolute: 2.3 10*3/uL — ABNORMAL HIGH (ref 0.1–1.0)
Monocytes Relative: 20 %
Neutro Abs: 7.7 10*3/uL (ref 1.7–7.7)
Neutrophils Relative %: 67 %
Platelets: 950 10*3/uL (ref 150–400)
RBC: 3.52 MIL/uL — ABNORMAL LOW (ref 4.22–5.81)
RDW: 16 % — ABNORMAL HIGH (ref 11.5–15.5)
WBC: 11.5 10*3/uL — ABNORMAL HIGH (ref 4.0–10.5)

## 2015-03-30 LAB — GLUCOSE, CAPILLARY
Glucose-Capillary: 151 mg/dL — ABNORMAL HIGH (ref 65–99)
Glucose-Capillary: 158 mg/dL — ABNORMAL HIGH (ref 65–99)
Glucose-Capillary: 131 mg/dL — ABNORMAL HIGH (ref 65–99)
Glucose-Capillary: 161 mg/dL — ABNORMAL HIGH (ref 65–99)

## 2015-03-30 MED ORDER — HYDROMORPHONE HCL 2 MG PO TABS
1.0000 mg | ORAL_TABLET | ORAL | Status: DC | PRN
  Administered 2015-03-31 – 2015-04-04 (×4): 1 mg via ORAL
  Filled 2015-03-30 (×4): qty 1

## 2015-03-30 MED ORDER — LIDOCAINE HCL 1 % IJ SOLN
INTRAMUSCULAR | Status: AC
  Filled 2015-03-30: qty 20

## 2015-03-30 NOTE — Progress Notes (Signed)
Patient ID: Derek Mohr Sr., male   DOB: [birth year], 70 y.o.   MRN: [id number]    Referring Physician(s): CCS  Chief Complaint: Abdominal abscesses   Subjective:  Pt without acute changes; currently denies abd pain  Allergies: Morphine and related; Oxycontin; Quinine; and Voltaren  Medications: Prior to Admission medications   Medication Sig Start Date End Date Taking? Authorizing Provider  carvedilol (COREG) 25 MG tablet Take 25 mg by mouth 2 (two) times daily with a meal.  09/26/11  Yes Historical Provider, MD  [employer] TRIPLE MAGNESIUM COMPLEX PO Take 1-2 capsules by mouth 2 (two) times daily. Two capsules in the morning and one at night   Yes Historical Provider, MD  diphenhydrAMINE (BENADRYL) 25 mg capsule Take 50 mg by mouth 2 (two) times daily. Reported on 03/17/2015   Yes Historical Provider, MD  HYDROmorphone (DILAUDID) 2 MG tablet Take 1 tablet (2 mg total) by mouth every 3 (three) hours as needed for moderate pain or severe pain. 03/03/15  Yes Armandina Gemma, MD  insulin aspart (NOVOLOG) 100 UNIT/ML injection Inject 0-9 Units into the skin 3 (three) times daily before meals. 03/03/15  Yes Armandina Gemma, MD  insulin glargine (LANTUS) 100 UNIT/ML injection Inject 0.2 mLs (20 Units total) into the skin daily. 03/03/15  Yes Armandina Gemma, MD  losartan (COZAAR) 50 MG tablet Take 50 mg by mouth daily. 11/06/14  Yes Historical Provider, MD  naproxen sodium (ALEVE) 220 MG tablet Take 440 mg by mouth 2 (two) times daily with a meal.    Yes Historical Provider, MD  aspirin EC 81 MG tablet Take 81 mg by mouth at bedtime.    Historical Provider, MD  clobetasol ointment (TEMOVATE) [id number] % Apply 1 application topically daily as needed (for peeling skin on hands and feet). Reported on 03/17/2015    Historical Provider, MD  Insulin Syringes, Disposable, U-100 0.5 ML MISC 20 Units by Does not apply route daily. 03/03/15   Armandina Gemma, MD  lansoprazole (PREVACID) 15 MG capsule Take 30 mg by mouth daily as  needed (reflux). Reported on 03/17/2015    Historical Provider, MD  [facility] DELICA LANCETS [id number] MISC 1 Device by Does not apply route 4 (four) times daily. 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 Sagittal and coronal MPR images reconstructed from axial data set. CONTRAST:  161mL OMNIPAQUE IOHEXOL 300 MG/ML SOLN IV. Dilute oral contrast. COMPARISON:  03/20/2015, 03/19/2015 FINDINGS: Loculated pleural fluid collection at LEFT lung base, new, 15 x 7 cm. Associated compressive atelectasis of LEFT lower lobe. Minimal dependent atelectasis RIGHT lower lobe. Tiny LEFT renal cyst stable. Liver, kidneys, and adrenal glands otherwise normal. Post distal pancreatectomy and splenectomy with residual pancreas normal appearance. Significantly decreased size of fluid collection at the pancreatic bed post percutaneous drainage, with  only a small amount of fluid identified near the pancreatic stump. Large gas and fluid collection in the in lateral LEFT mid abdomen measures 10.5 x 8.2 x 8.7 cm, mildly decreased in size since the previous exam but demonstrating less significant response to percutaneous drainage. Percutaneous drainage catheter remains at lateral aspect of this collection, toward superior end. No new intra-abdominal or  intrapelvic fluid collections. Few scattered normal size mesenteric and retroperitoneal lymph nodes without discrete adenopathy. Moderate-sized hiatal hernia. Stomach and bowel loops otherwise normal appearance. Large RIGHT posterior bladder diverticulum, bladder and ureters otherwise normal. LEFT inguinal hernia containing fat. Tiny focus of gas within the LEFT rectus sheath image 46 not definitely seen previously. No free intraperitoneal air or acute osseous findings. IMPRESSION: Decrease in size of postoperative fluid collection at the bed of the pancreatic tail post percutaneous drainage. Mild decrease in size of additional LEFT upper quadrant postoperative collection though demonstrating a elss significant response than the pancreatic tail collection. New loculated LEFT pleural effusion. Single tiny focus of nonspecific gas within the LEFT rectus sheath; potential this could be related to interval percutaneous drainage procedure, recommend attention on follow-up imaging. Electronically Signed   By: Lavonia Dana M.D.   On: 03/29/2015 09:47    Labs:  CBC:  Recent Labs  03/23/15 0340 03/24/15 0330 03/26/15 0508 03/30/15 0529  WBC 15.5* 15.0* 13.2* 11.5*  HGB 9.7* 10.1* 9.4* 8.4*  HCT 31.5* 33.2* 30.8* 27.0*  PLT 898* 992* 936* 950*    COAGS:  Recent Labs  02/19/15 0920 03/18/15 1859  INR 1.07 1.33  APTT 28  --     BMP:  Recent Labs  03/24/15 0330 03/26/15 0508 03/27/15 0544 03/29/15 0456  NA 132* 132* 130* 133*  K 4.2 4.2 3.9 3.7  CL 95* 98* 98* 100*  CO2 27 26 22 23   GLUCOSE 125* 168* 154* 137*  BUN 7 14 12 11   CALCIUM 10.4* 10.4* 9.8 9.8  CREATININE 0.73 1.12 1.07 1.04  GFRNONAA >60 >60 >60 >60  GFRAA >60 >60 >60 >60    LIVER FUNCTION TESTS:  Recent Labs  02/19/15 0920 03/18/15 1859 03/23/15 0340  BILITOT 0.6 0.7 0.9  AST 19 34 29  ALT 23 31 23   ALKPHOS 83 147* 155*  PROT 6.9 7.1 6.5  ALBUMIN 4.2 2.7* 2.2*    Assessment and Plan: S/p distal  pancreatectomy and splenectomy, now with postoperative fluid collections and pseudocyst formation; s/p left abd drain placement x2 by Dr. Annamaria Boots on 1/28; AF; WBC 11.5 (13.2); plts 950k; hgb 8.4(9.4); Dr. Earleen Newport attempted left thoracentesis today via US guidance but unable to aspirate fluid. Will plan procedure under CT guidance on 2/8.    Electronically Signed: D. Rowe Robert 03/30/2015, 4:40 PM   I spent a total of 15 minutes at the the patient's bedside AND on the patient's hospital floor or unit, greater than 50% of which was counseling/coordinating care for abdominal drains

## 2015-03-30 NOTE — Progress Notes (Signed)
CRITICAL VALUE ALERT  Critical value received:  PLT 950  Date of notification:  03/30/15  Time of notification:  No call received  Critical value read back: no  Nurse who received alert:  N/A  MD notified (1st page):  [place]ower  Time of first page:  [phone]  MD notified (2nd page):  Time of second page:  Responding MD:  Zella Richer   Time MD responded:  [phone]

## 2015-03-30 NOTE — Procedures (Signed)
Interventional Radiology Procedure Note  Procedure: Attempt of left US guided thora, for presumed empyema.  Small window via posterior approach.  No significant fluid via yueh needle.   Complications: None Recommendations:  - Will reschedule for CT guided aspiration/drain via anterior/lateral approach. - CXR pending. - Routine wound care   Signed,  Dulcy Fanny. Earleen Newport, DO  \

## 2015-03-30 NOTE — Consult Note (Signed)
   [facility] CM Inpatient Consult   03/30/2015  JOSHEPH NICHOLES Sr. [birth year] [id number]   Following along for [facility] Care Management services. Appears patient likely to go to SNF. Will make appropriate [facility] community referral once disposition is known. Spoke with inpatient RNCM about patient.  Marthenia Rolling, MSN-Ed, RN,BSN [facility] Liaison [phone]

## 2015-03-30 NOTE — Progress Notes (Signed)
Physical Therapy Treatment Patient Details Name: Derek [name] Sr. MRN: [id number] DOB: 08/29/1945 Today's Date: 03/30/2015    History of Present Illness 70yo male who is s/p pancreatectomy 2* cancer now readmitted with abscess. H/o B TKA, HTN, DM, Meniere's disease (with falls), obesity, anxiety    PT Comments    Pt appears more "chipper" today.  Assisted OOB to amb a greater distance and increased upright stance time to complete 50 feet.  Assisted to [facility] then positioned in recliner.  Pt progressing slowly and will need ST Rehab at SNF prior to safely returning home.   Follow Up Recommendations  SNF     Equipment Recommendations       Recommendations for Other Services       Precautions / Restrictions Precautions Precautions: Fall Precaution Comments: drains L side. Hx of Meniere's disease Restrictions Weight Bearing Restrictions: No    Mobility  Bed Mobility Overal bed mobility: Needs Assistance Bed Mobility: Supine to Sit       Sit to supine: Mod assist;HOB elevated   General bed mobility comments: Assist for trunk and LEs. Increased time.  Once EOB pt able to sit Indep   Transfers Overall transfer level: Needs assistance Equipment used: Rolling walker (2 wheeled) Transfers: Sit to/from Stand Sit to Stand: Min assist;Mod assist         General transfer comment: requires increased assist with each performance.  50% VC's on proper hand placement to power up and to controll decend. stood a total of 6 times.  Ambulation/Gait Ambulation/Gait assistance: Min assist;+2 safety/equipment Ambulation Distance (Feet): 50 Feet Assistive device: Rolling walker (2 wheeled) Gait Pattern/deviations: Step-to pattern;Step-through pattern;Decreased step length - right;Decreased step length - left;Decreased stride length;Trunk flexed;Shuffle Gait velocity: decreased   General Gait Details: very slow sluggish gait with recliner following. Limited activity tolerance.  Requires  extended rest breaks between activity.    Stairs            Wheelchair Mobility    Modified Rankin (Stroke Patients Only)       Balance                                    Cognition Arousal/Alertness: Awake/alert Behavior During Therapy: WFL for tasks assessed/performed Overall Cognitive Status: Within Functional Limits for tasks assessed                      Exercises      General Comments        Pertinent Vitals/Pain Pain Assessment: Faces Faces Pain Scale: Hurts a little bit Pain Location: Low back Pain Descriptors / Indicators: Constant Pain Intervention(s): Monitored during session;Repositioned    Home Living                      Prior Function            PT Goals (current goals can now be found in the care plan section) Progress towards PT goals: Progressing toward goals    Frequency  Min 3X/week    PT Plan Current plan remains appropriate    Co-evaluation             End of Session Equipment Utilized During Treatment: Gait belt Activity Tolerance: Treatment limited secondary to medical complications (Comment) Patient left: in chair;with call bell/phone within reach;with family/visitor present     Time: 1355-1450 PT Time Calculation (min) (ACUTE ONLY):  55 min  Charges:  $Gait Training: 23-37 mins $Therapeutic Activity: 23-37 mins                    G Codes:      Rica Koyanagi  PTA [facility]  Acute  Rehab Pager      [phone]

## 2015-03-30 NOTE — Progress Notes (Signed)
Patient ID: Derek Mohr Sr., male   DOB: [birth year], 70 y.o.   MRN: [id number]  [facility]  Subjective: Patient in bed, ambulated in hall today.  Wife at bedside.  Objective: Vital signs in last 24 hours: Temp:  [97.5 F (36.4 C)-98.8 F (37.1 C)] 98.5 F (36.9 C) (02/07 1500) Pulse Rate:  [88-102] 94 (02/07 1614) Resp:  [18-20] 20 (02/07 1500) BP: (155-189)/(63-72) 156/63 mmHg (02/07 1614) SpO2:  [89 %-94 %] 89 % (02/07 1614) Last BM Date: 03/29/15  Intake/Output from previous day: 02/06 0701 - 02/07 0700 In: 2869.7 [P.O.:408; I.V.:2391.7; IV Piggyback:50] Out: [id number] [Urine:1500; Drains:105] Intake/Output this shift: Total I/O In: 120 [P.O.:120] Out: 790 [Urine:640; Drains:150]  Physical Exam: HEENT - sclerae clear, mucous membranes moist Abdomen - soft, drains LUQ site clear Neuro - alert & oriented, no focal deficits  Lab Results:   Recent Labs  03/30/15 0529  WBC 11.5*  HGB 8.4*  HCT 27.0*  PLT 950*   BMET  Recent Labs  03/29/15 0456  NA 133*  K 3.7  CL 100*  CO2 23  GLUCOSE 137*  BUN 11  CREATININE 1.04  CALCIUM 9.8   PT/INR No results for input(s): LABPROT, INR in the last 72 hours. Comprehensive Metabolic Panel:    Component Value Date/Time   NA 133* 03/29/2015 0456   NA 130* 03/27/2015 0544   K 3.7 03/29/2015 0456   K 3.9 03/27/2015 0544   CL 100* 03/29/2015 0456   CL 98* 03/27/2015 0544   CO2 23 03/29/2015 0456   CO2 22 03/27/2015 0544   BUN 11 03/29/2015 0456   BUN 12 03/27/2015 0544   CREATININE 1.04 03/29/2015 0456   CREATININE 1.07 03/27/2015 0544   GLUCOSE 137* 03/29/2015 0456   GLUCOSE 154* 03/27/2015 0544   CALCIUM 9.8 03/29/2015 0456   CALCIUM 9.8 03/27/2015 0544   AST 29 03/23/2015 0340   AST 34 03/18/2015 1859   ALT 23 03/23/2015 0340   ALT 31 03/18/2015 1859   ALKPHOS 155* 03/23/2015 0340   ALKPHOS 147* 03/18/2015 1859   BILITOT 0.9 03/23/2015 0340   BILITOT 0.7 03/18/2015  1859   PROT 6.5 03/23/2015 0340   PROT 7.1 03/18/2015 1859   ALBUMIN 2.2* 03/23/2015 0340   ALBUMIN 2.7* 03/18/2015 1859    Studies/Results: Dg Chest 1 View  03/30/2015  CLINICAL DATA:  Attempted LEFT thoracentesis EXAM: CHEST 1 VIEW COMPARISON:  03/20/2015 FINDINGS: Rotation to the RIGHT. Enlargement of cardiac silhouette. Pulmonary vascular congestion. Large peripheral opacity at the lateral aspect of the LEFT hemi thorax extending from above the aortic arch to the diaphragm likely representing a loculated pleural fluid collection. This is significantly increased since 03/20/2015. Significant compressive atelectasis of the mid to lower LEFT lung. RIGHT lung grossly clear. No pneumothorax. Bones demineralized. IMPRESSION: Significant interval increase in size of a large loculated LEFT pleural collection since 03/20/2015. Electronically Signed   By: Lavonia Dana M.D.   On: 03/30/2015 17:11   US Guided Needle Placement  03/30/2015  CLINICAL DATA:  70 year old male with a history of pancreatic pseudocyst and abdominal abscesses with presumed left sympathetic effusion/empyema. He has been referred for sampling. EXAM: LIMITED ULTRASOUND LEFT CHEST : COMPARISON: CT 03/29/2015, 03/20/2015 PROCEDURE: An ultrasound guided thoracentesis was thoroughly discussed with the patient and questions answered. The benefits, risks, alternatives and complications were also discussed. The patient understands and wishes to proceed with the procedure. Written consent was obtained. Ultrasound survey performed the left  chest. Small window for a posterior approach, with ultrasound-guided thoracentesis attempted. The area was prepped and draped in the normal sterile fashion. 1% Lidocaine was used for local anesthesia. Under ultrasound guidance a 19 gauge Yueh catheter was introduced. No significant fluid was aspirated from small fluid collection. Second site was attempted. Patient tolerated the procedure well and remained  hemodynamically stable throughout. No significant blood loss encountered.  No complications. FINDINGS: Small fluid pocket from posterior approach to the chest. No significant fluid was aspirated. IMPRESSION: Status post left ultrasound-guided lower with no significant fluid aspirated from small pocket from posterior thoracic approach. The patient will be rescheduled for a CT guided aspiration/drainage of pleural effusion from anterior lateral approach. Signed, Dulcy Fanny. Earleen Newport, DO [facility] Radiology Electronically Signed   By: Corrie Mckusick D.O.   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 Sagittal and coronal MPR images reconstructed from axial data set. CONTRAST:  167mL OMNIPAQUE IOHEXOL 300 MG/ML SOLN IV. Dilute oral contrast. COMPARISON:  03/20/2015, 03/19/2015 FINDINGS: Loculated pleural fluid collection at LEFT lung base, new, 15 x 7 cm. Associated compressive atelectasis of LEFT lower lobe. Minimal dependent atelectasis RIGHT lower lobe. Tiny LEFT renal cyst stable. Liver, kidneys, and adrenal glands otherwise normal. Post distal pancreatectomy and splenectomy with residual pancreas normal appearance. Significantly decreased size of fluid collection at the pancreatic bed post percutaneous drainage, with only a small amount of fluid identified near the pancreatic stump. Large gas and fluid collection in the in lateral LEFT mid abdomen measures 10.5 x 8.2 x 8.7 cm, mildly decreased in size since the previous exam but demonstrating less significant response to percutaneous drainage.  Percutaneous drainage catheter remains at lateral aspect of this collection, toward superior end. No new intra-abdominal or intrapelvic fluid collections. Few scattered normal size mesenteric and retroperitoneal lymph nodes without discrete adenopathy. Moderate-sized hiatal hernia. Stomach and bowel loops otherwise normal appearance. Large RIGHT posterior bladder diverticulum, bladder and ureters otherwise normal. LEFT inguinal hernia containing fat. Tiny focus of gas within the LEFT rectus sheath image 46 not definitely seen previously. No free intraperitoneal air or acute osseous findings. IMPRESSION: Decrease in size of postoperative fluid collection at the bed of the pancreatic tail post percutaneous drainage. Mild decrease in size of additional LEFT upper quadrant postoperative collection though demonstrating a elss significant response than the pancreatic tail collection. New loculated LEFT pleural effusion. Single tiny focus of nonspecific gas within the LEFT rectus sheath; potential this could be related to interval percutaneous drainage procedure, recommend attention on follow-up imaging. Electronically Signed   By: Lavonia Dana M.D.   On: 03/29/2015 09:47    Assessment & Plans: Status post distal pancreatectomy, splenectomy for neuroendocrine tumor of pancreas  WBC continues to improve with normal diff  Still on IV vancomycin and Invanz  Thoracentesis rescheduled until tomorrow for CT guidance  Drains remain in place - drain adjacent to pancreas with minimal output today, other drain with 150cc  Amylase high in one drain, low in other - ? Unable to tell which drain to due to labelling by lab  Overall making progress.  Will need to stop abx soon and monitor.  Will likely need to go to SNF or Rehab with drains in place.  Discussed all with patient and his wife who understand that he's making progress slowly.  [name], MD, [facility] Office:  [place] 03/30/2015

## 2015-03-31 ENCOUNTER — Inpatient Hospital Stay (HOSPITAL_COMMUNITY): Payer: PPO

## 2015-03-31 DIAGNOSIS — J90 Pleural effusion, not elsewhere classified: Secondary | ICD-10-CM | POA: Insufficient documentation

## 2015-03-31 LAB — GLUCOSE, CAPILLARY
Glucose-Capillary: 111 mg/dL — ABNORMAL HIGH (ref 65–99)
Glucose-Capillary: 133 mg/dL — ABNORMAL HIGH (ref 65–99)
Glucose-Capillary: 130 mg/dL — ABNORMAL HIGH (ref 65–99)
Glucose-Capillary: 143 mg/dL — ABNORMAL HIGH (ref 65–99)
Glucose-Capillary: 134 mg/dL — ABNORMAL HIGH (ref 65–99)
Glucose-Capillary: 125 mg/dL — ABNORMAL HIGH (ref 65–99)

## 2015-03-31 MED ORDER — FENTANYL CITRATE (PF) 100 MCG/2ML IJ SOLN
INTRAMUSCULAR | Status: AC
  Filled 2015-03-31: qty 4

## 2015-03-31 MED ORDER — FENTANYL CITRATE (PF) 100 MCG/2ML IJ SOLN
INTRAMUSCULAR | Status: AC | PRN
  Administered 2015-03-31 (×2): 25 ug via INTRAVENOUS

## 2015-03-31 MED ORDER — MIDAZOLAM HCL 2 MG/2ML IJ SOLN
INTRAMUSCULAR | Status: AC
  Filled 2015-03-31: qty 6

## 2015-03-31 MED ORDER — HEPARIN SODIUM (PORCINE) 5000 UNIT/ML IJ SOLN
5000.0000 [IU] | Freq: Three times a day (TID) | INTRAMUSCULAR | Status: DC
  Administered 2015-03-31 – 2015-04-06 (×17): 5000 [IU] via SUBCUTANEOUS
  Filled 2015-03-31 (×17): qty 1

## 2015-03-31 MED ORDER — MIDAZOLAM HCL 2 MG/2ML IJ SOLN
INTRAMUSCULAR | Status: AC | PRN
  Administered 2015-03-31: 0.5 mg via INTRAVENOUS
  Administered 2015-03-31: 1 mg via INTRAVENOUS

## 2015-03-31 NOTE — Sedation Documentation (Signed)
Patient denies pain and is resting comfortably.  

## 2015-03-31 NOTE — Progress Notes (Signed)
PT Cancellation Note  Patient Details Name: Derek GUILD Sr. MRN: [id number] DOB: 07-08-1945   Cancelled Treatment:     Thoracentesis today    Nathanial Rancher 03/31/2015, 3:05 PM

## 2015-03-31 NOTE — Progress Notes (Signed)
Patient ID: Derek Mohr Sr., male   DOB: 1945/11/02, 70 y.o.   MRN: [id number]    Referring Physician(s): CCS  Chief Complaint: Abdominal abscesses   Subjective:  Pt doing fair; still has LBP, some tenderness over left abd drain sites; eager to get out of hospital  Allergies: Morphine and related; Oxycontin; Quinine; and Voltaren  Medications: Prior to Admission medications   Medication Sig Start Date End Date Taking? Authorizing Provider  carvedilol (COREG) 25 MG tablet Take 25 mg by mouth 2 (two) times daily with a meal.  09/26/11  Yes Historical Provider, MD  [employer] TRIPLE MAGNESIUM COMPLEX PO Take 1-2 capsules by mouth 2 (two) times daily. Two capsules in the morning and one at night   Yes Historical Provider, MD  diphenhydrAMINE (BENADRYL) 25 mg capsule Take 50 mg by mouth 2 (two) times daily. Reported on 03/17/2015   Yes Historical Provider, MD  HYDROmorphone (DILAUDID) 2 MG tablet Take 1 tablet (2 mg total) by mouth every 3 (three) hours as needed for moderate pain or severe pain. 03/03/15  Yes Armandina Gemma, MD  insulin aspart (NOVOLOG) 100 UNIT/ML injection Inject 0-9 Units into the skin 3 (three) times daily before meals. 03/03/15  Yes Armandina Gemma, MD  insulin glargine (LANTUS) 100 UNIT/ML injection Inject 0.2 mLs (20 Units total) into the skin daily. 03/03/15  Yes Armandina Gemma, MD  losartan (COZAAR) 50 MG tablet Take 50 mg by mouth daily. 11/06/14  Yes Historical Provider, MD  naproxen sodium (ALEVE) 220 MG tablet Take 440 mg by mouth 2 (two) times daily with a meal.    Yes Historical Provider, MD  aspirin EC 81 MG tablet Take 81 mg by mouth at bedtime.    Historical Provider, MD  clobetasol ointment (TEMOVATE) [id number] % Apply 1 application topically daily as needed (for peeling skin on hands and feet). Reported on 03/17/2015    Historical Provider, MD  Insulin Syringes, Disposable, U-100 0.5 ML MISC 20 Units by Does not apply route daily. 03/03/15   Armandina Gemma, MD  lansoprazole  (PREVACID) 15 MG capsule Take 30 mg by mouth daily as needed (reflux). Reported on 03/17/2015    Historical Provider, MD  [facility] DELICA LANCETS [id number] MISC 1 Device by Does not apply route 4 (four) times daily. 03/17/15   Renato Shin, MD     Vital Signs: BP 165/70 mmHg  Pulse 87  Temp(Src) 98.4 F (36.9 C) (Oral)  Resp 18  Ht 6\' 2"  (1.88 m)  Wt 238 lb 12.1 oz (108.3 kg)  BMI 30.64 kg/m2  SpO2 92%  Physical Exam awake/alert; chest- dim BS left, right clear; heart-RRR; abd- soft, left abd drains intact, outputs left ant-250 cc left post-20 cc brown/beige colored fluid  Imaging: Dg Chest 1 View  03/30/2015  CLINICAL DATA:  Attempted LEFT thoracentesis EXAM: CHEST 1 VIEW COMPARISON:  03/20/2015 FINDINGS: Rotation to the RIGHT. Enlargement of cardiac silhouette. Pulmonary vascular congestion. Large peripheral opacity at the lateral aspect of the LEFT hemi thorax extending from above the aortic arch to the diaphragm likely representing a loculated pleural fluid collection. This is significantly increased since 03/20/2015. Significant compressive atelectasis of the mid to lower LEFT lung. RIGHT lung grossly clear. No pneumothorax. Bones demineralized. IMPRESSION: Significant interval increase in size of a large loculated LEFT pleural collection since 03/20/2015. Electronically Signed   By: Lavonia Dana M.D.   On: 03/30/2015 17:11   US Guided Needle Placement  03/30/2015  CLINICAL DATA:  70 year old male with a history  of pancreatic pseudocyst and abdominal abscesses with presumed left sympathetic effusion/empyema. He has been referred for sampling. EXAM: LIMITED ULTRASOUND LEFT CHEST : COMPARISON: CT 03/29/2015, 03/20/2015 PROCEDURE: An ultrasound guided thoracentesis was thoroughly discussed with the patient and questions answered. The benefits, risks, alternatives and complications were also discussed. The patient understands and wishes to proceed with the procedure. Written consent was obtained.  Ultrasound survey performed the left chest. Small window for a posterior approach, with ultrasound-guided thoracentesis attempted. The area was prepped and draped in the normal sterile fashion. 1% Lidocaine was used for local anesthesia. Under ultrasound guidance a 19 gauge Yueh catheter was introduced. No significant fluid was aspirated from small fluid collection. Second site was attempted. Patient tolerated the procedure well and remained hemodynamically stable throughout. No significant blood loss encountered.  No complications. FINDINGS: Small fluid pocket from posterior approach to the chest. No significant fluid was aspirated. IMPRESSION: Status post left ultrasound-guided lower with no significant fluid aspirated from small pocket from posterior thoracic approach. The patient will be rescheduled for a CT guided aspiration/drainage of pleural effusion from anterior lateral approach. Signed, Dulcy Fanny. Earleen Newport, DO [facility] Electronically Signed   By: Corrie Mckusick D.O.   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 Sagittal and coronal MPR images reconstructed from axial data set. CONTRAST:  150mL OMNIPAQUE IOHEXOL 300 MG/ML SOLN IV. Dilute oral contrast. COMPARISON:  03/20/2015, 03/19/2015 FINDINGS: Loculated pleural fluid collection at LEFT lung base, new, 15 x 7 cm. Associated compressive atelectasis of LEFT lower lobe. Minimal dependent atelectasis RIGHT lower lobe. Tiny LEFT renal cyst stable. Liver, kidneys, and adrenal glands otherwise normal. Post distal pancreatectomy  and splenectomy with residual pancreas normal appearance. Significantly decreased size of fluid collection at the pancreatic bed post percutaneous drainage, with only a small amount of fluid identified near the pancreatic stump. Large gas and fluid collection in the in lateral LEFT mid abdomen measures 10.5 x 8.2 x 8.7 cm, mildly decreased in size since the previous exam but demonstrating less significant response to percutaneous drainage. Percutaneous drainage catheter remains at lateral aspect of this collection, toward superior end. No new intra-abdominal or intrapelvic fluid collections. Few scattered normal size mesenteric and retroperitoneal lymph nodes without discrete adenopathy. Moderate-sized hiatal hernia. Stomach and bowel loops otherwise normal appearance. Large RIGHT posterior bladder diverticulum, bladder and ureters otherwise normal. LEFT inguinal hernia containing fat. Tiny focus of gas within the LEFT rectus sheath image 46 not definitely seen previously. No free intraperitoneal air or acute osseous findings. IMPRESSION: Decrease in size of postoperative fluid collection at the bed of the pancreatic tail post percutaneous drainage. Mild decrease in size of additional LEFT upper quadrant postoperative collection though demonstrating a elss significant response than the pancreatic tail collection. New loculated LEFT pleural effusion. Single tiny focus of nonspecific gas within the LEFT rectus sheath; potential this could be related to interval percutaneous drainage procedure, recommend attention on follow-up imaging. Electronically Signed   By: Lavonia Dana M.D.   On: 03/29/2015 09:47    Labs:  CBC:  Recent Labs  03/23/15 0340 03/24/15 0330 03/26/15 0508 03/30/15 0529  WBC 15.5* 15.0* 13.2* 11.5*  HGB 9.7* 10.1* 9.4* 8.4*  HCT 31.5* 33.2* 30.8* 27.0*  PLT 898* 992* 936* 950*    COAGS:  Recent Labs  02/19/15 0920 03/18/15 1859  INR 1.07 1.33  APTT 28  --     BMP:  Recent  Labs  03/24/15 0330 03/26/15 0508 03/27/15 0544 03/29/15 0456  NA 132* 132* 130* 133*  K 4.2 4.2 3.9 3.7  CL 95* 98* 98* 100*  CO2 27 26 22 23   GLUCOSE 125* 168* 154* 137*  BUN 7 14 12 11   CALCIUM 10.4* 10.4* 9.8 9.8  CREATININE 0.73 1.12 1.07 1.04  GFRNONAA >60 >60 >60 >60  GFRAA >60 >60 >60 >60    LIVER FUNCTION TESTS:  Recent Labs  02/19/15 0920 03/18/15 1859 03/23/15 0340  BILITOT 0.6 0.7 0.9  AST 19 34 29  ALT 23 31 23   ALKPHOS 83 147* 155*  PROT 6.9 7.1 6.5  ALBUMIN 4.2 2.7* 2.2*    Assessment and Plan: 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 Annamaria Boots on 1/28; AF; case reviewed again by Dr. Pascal Lux this am; plan is for CT guided left pleural fluid collection aspiration/possible drainage as well as possible manipulation/exchange/upsizing of existing abd drains to expedite output; above plans d/w pt/wife with their understanding and consent   Electronically Signed: D. Rowe Robert 03/31/2015, 9:10 AM   I spent a total of 15 minutes at the the patient's bedside AND on the patient's hospital floor or unit, greater than 50% of which was counseling/coordinating care for left abd abscess drains

## 2015-03-31 NOTE — Procedures (Signed)
Technically successful CT guided placed of a 12 Fr drainage catheter placement into the left pleural space yielding 450 cc of serous fluid.   All aspirated samples sent to the laboratory for analysis.   Technically successful CT guided repositioning and upsizing of a now 12 Fr drainage catheter placement into the left anterior abdomen yielding 50 cc of purulent appearing fluid.   No immediate post procedural complications.   Ronny Bacon, MD Pager #: [phone]

## 2015-03-31 NOTE — Progress Notes (Signed)
Per post IR procedure protocol - heparin 5000units sq q8h will resume at 2200 tonight.   Romeo Rabon, PharmD, pager [phone]. 03/31/2015,4:16 PM.

## 2015-03-31 NOTE — Progress Notes (Signed)
CSW continuing to follow for disposition planning.   CSW visited pt and pt wife at bedside.   Pt had pleurx catheter placed today. Pt was in discomfort during visit and pt wife aiding pt at this time.   Pt wife reports that she will be back at hospital tomorrow and CSW will follow up then to discuss disposition plan.   Alison Murray, MSW, [place] [facility] [phone]

## 2015-03-31 NOTE — Progress Notes (Signed)
General Surgery [facility]  Patient in radiology for thoracentesis and manipulation of LUQ abdominal drains.  Wife in room - discussed current management and expectations going forward.  Reviewed rehab consult and recommendations for SNF - tentatively [facility] within next several days pending outcome of today's procedures.  Plan to complete course of antibiotics by Saturday if no new findings from thoracentesis.  Will see patient when returns to room.  [name], MD, [facility] Office: [phone]

## 2015-04-01 ENCOUNTER — Inpatient Hospital Stay (HOSPITAL_COMMUNITY): Payer: PPO

## 2015-04-01 LAB — BASIC METABOLIC PANEL
Anion gap: 10 (ref 5–15)
BUN: 11 mg/dL (ref 6–20)
CO2: 24 mmol/L (ref 22–32)
Calcium: 9.5 mg/dL (ref 8.9–10.3)
Chloride: 101 mmol/L (ref 101–111)
Creatinine, Ser: 1.03 mg/dL (ref 0.61–1.24)
GFR calc Af Amer: >60 mL/min (ref >60)
GFR calc non Af Amer: >60 mL/min (ref >60)
Glucose, Bld: 124 mg/dL — ABNORMAL HIGH (ref 65–99)
Potassium: 3.1 mmol/L — ABNORMAL LOW (ref 3.5–5.1)
Sodium: 135 mmol/L (ref 135–145)

## 2015-04-01 LAB — GLUCOSE, CAPILLARY
Glucose-Capillary: 110 mg/dL — ABNORMAL HIGH (ref 65–99)
Glucose-Capillary: 114 mg/dL — ABNORMAL HIGH (ref 65–99)
Glucose-Capillary: 115 mg/dL — ABNORMAL HIGH (ref 65–99)
Glucose-Capillary: 148 mg/dL — ABNORMAL HIGH (ref 65–99)
Glucose-Capillary: 168 mg/dL — ABNORMAL HIGH (ref 65–99)
Glucose-Capillary: 180 mg/dL — ABNORMAL HIGH (ref 65–99)

## 2015-04-01 LAB — VANCOMYCIN, TROUGH: Vancomycin Tr: 17 ug/mL (ref 10.0–20.0)

## 2015-04-01 LAB — AMYLASE, PLEURAL FLUID: Amylase, Pleural Fluid: 15 U/L

## 2015-04-01 MED ORDER — POTASSIUM CHLORIDE IN NACL 20-0.45 MEQ/L-% IV SOLN
INTRAVENOUS | Status: DC
  Administered 2015-04-01 – 2015-04-04 (×4): via INTRAVENOUS
  Filled 2015-04-01 (×6): qty 1000

## 2015-04-01 MED ORDER — POTASSIUM CHLORIDE CRYS ER 10 MEQ PO TBCR
10.0000 meq | EXTENDED_RELEASE_TABLET | Freq: Once | ORAL | Status: AC
  Administered 2015-04-01: 10 meq via ORAL
  Filled 2015-04-01: qty 1

## 2015-04-01 NOTE — Progress Notes (Signed)
Pharmacy Antibiotic Note  Matthewjames Grandberry. is a 70 y.o. male admitted on 03/18/2015 on day 13 vancomycin and day 14 ertapenem for abscesses/fluid collections following distal pancreatectomy/splenectomy.  Plan:  Continue Vancomycin 1g IV every 12 hours.  Goal trough 15-20 mcg/mL.   Continue Ertapenem per MD  Follow up renal fxn, culture results, and clinical course.   Height: 6\' 2"  (188 cm) Weight: 238 lb 12.1 oz (108.3 kg) IBW/kg (Calculated) : 82.2  Temp (24hrs), Avg:98.3 F (36.8 C), Min:97.5 F (36.4 C), Max:99.1 F (37.3 C)   Recent Labs Lab 03/26/15 0508  03/27/15 0544 03/29/15 0456 03/30/15 0529 04/01/15 0457 04/01/15 0813  WBC 13.2*  --   --   --  11.5*  --   --   CREATININE 1.12  --  1.07 1.04  --  1.03  --   VANCOTROUGH  --   < >  --  21*  --   --  17  < > = values in this interval not displayed.  Estimated Creatinine Clearance: 87.4 mL/min (by C-G formula based on Cr of 1.03).    Allergies  Allergen Reactions  . Morphine And Related Nausea And Vomiting  . Oxycontin [Oxycodone Hcl] Swelling    Lips swells. Tolerates immediate-release oxycodone as well as hydrocodone.  . Quinine Other (See Comments)    Platelets dropped  . Voltaren [Diclofenac Sodium] Other (See Comments)    Elevated liver enzymes    Antimicrobials this admission: 1/27 >> ertapenem >> 1/28 >> vanc >> 1/29 >> diflucan >> 2/1  Dose adjustments this admission: 1/30: 2130 VT = 10 on 750 q12h, increase to 1g q12h 2/3: 0930 VT = 14 on 1g q12 (last dose given 1 hr early) 2/4: increased to 1250 q12 since new fevers and VT not true 15-20 2/6: 0500 VT = 21 on 1250 mg q12h (after 3 doses, SCr improving) 2/9 0813 VT = 17 on 1g q12  Microbiology results: 1/28 BCx: NGF 1/28 Abscess (panceatic): abundant Coagulase negative staphylococcus 1/28 MRSA PCR: positive 2/8 Pleural fluid: ngtd  Thank you for allowing pharmacy to be a part of this patient's care.  Gretta Arab PharmD,  BCPS Pager [phone] 04/01/2015 1:34 PM

## 2015-04-01 NOTE — Progress Notes (Signed)
Rehab admissions - Noted tentative plan is for [facility].  If this changes, please let me know.  Wife works and apparently cannot provide assistance at home.  Call me for questions.  RC:[phone]

## 2015-04-01 NOTE — Progress Notes (Signed)
Physical Therapy Treatment Patient Details Name: Derek [name] Sr. MRN: [id number] DOB: Feb 05, 1946 Today's Date: 04/01/2015    History of Present Illness 70yo male who is s/p pancreatectomy 2* cancer now readmitted with abscess. H/o B TKA, HTN, DM, Meniere's disease (with falls), obesity, anxiety    PT Comments    Assisted pt OOB to [facility] with NT.  Assisted with hygiene then amb in hallway + 2 assist for safety such that recliner was following.  Tolerated increased distance and upright stance time.   Follow Up Recommendations  SNF     Equipment Recommendations       Recommendations for Other Services       Precautions / Restrictions Precautions Precautions: Fall Precaution Comments: drains L side and T chest tube      Hx of Meniere's disease Restrictions Weight Bearing Restrictions: No    Mobility  Bed Mobility Overal bed mobility: Needs Assistance Bed Mobility: Supine to Sit     Supine to sit: Mod assist;Min assist     General bed mobility comments: Assist for trunk and LEs. Increased time.  Once EOB pt able to sit Indep   Transfers Overall transfer level: Needs assistance Equipment used: Rolling walker (2 wheeled) Transfers: Sit to/from Stand           General transfer comment: requires increased assist with each performance.  50% VC's on proper hand placement to power up and to controll decend. assisted to [facility].    Ambulation/Gait Ambulation/Gait assistance: Min assist;+2 safety/equipment Ambulation Distance (Feet): 68 Feet Assistive device: Rolling walker (2 wheeled)   Gait velocity: decreased   General Gait Details: very slow sluggish gait with recliner following. Limited activity tolerance.  Requires extended rest breaks between activity.    Stairs            Wheelchair Mobility    Modified Rankin (Stroke Patients Only)       Balance                                    Cognition Arousal/Alertness: Awake/alert Behavior  During Therapy: WFL for tasks assessed/performed Overall Cognitive Status: Within Functional Limits for tasks assessed                      Exercises      General Comments        Pertinent Vitals/Pain Pain Assessment: Faces Faces Pain Scale: Hurts little more Pain Location: low back and L side near drains Pain Descriptors / Indicators: Constant Pain Intervention(s): Monitored during session;Repositioned    Home Living                      Prior Function            PT Goals (current goals can now be found in the care plan section) Progress towards PT goals: Progressing toward goals    Frequency  Min 3X/week    PT Plan Current plan remains appropriate    Co-evaluation             End of Session Equipment Utilized During Treatment: Gait belt Activity Tolerance: Treatment limited secondary to medical complications (Comment) Patient left: in chair;with call bell/phone within reach;with family/visitor present     Time: [id number] PT Time Calculation (min) (ACUTE ONLY): 45 min  Charges:  $Gait Training: 8-22 mins $Therapeutic Activity: 23-37 mins  G Codes:      Rica Koyanagi  PTA [facility]  Acute  Rehab Pager      [phone]

## 2015-04-01 NOTE — Progress Notes (Signed)
Patient ID: Derek Mohr Sr., male   DOB: 11-09-1945, 70 y.o.   MRN: [id number]  [facility]  Subjective: Patient up in chair, daughter at bedside.  Some pain from thoracostomy tube.  Objective: Vital signs in last 24 hours: Temp:  [97.5 F (36.4 C)-99.1 F (37.3 C)] 97.5 F (36.4 C) (02/09 0532) Pulse Rate:  [78-91] 82 (02/09 0532) Resp:  [16-25] 20 (02/09 0532) BP: (127-165)/(38-74) 150/69 mmHg (02/09 0532) SpO2:  [92 %-97 %] 93 % (02/09 0532) Last BM Date: 03/29/15  Intake/Output from previous day: 02/08 0701 - 02/09 0700 In: 2168.3 [P.O.:150; I.V.:1568.3; IV Piggyback:450] Out: 2075 [Urine:1775; Drains:300] Intake/Output this shift:    Physical Exam: HEENT - sclerae clear, mucous membranes moist Neck - soft Chest - clear bilaterally Cor - RRR Abdomen - soft without distension; drains with cloudy tan fluid in each; thoracostomy tube with thin clear yellowish fluid Ext - no edema, non-tender Neuro - alert & oriented, no focal deficits  Lab Results:   Recent Labs  03/30/15 0529  WBC 11.5*  HGB 8.4*  HCT 27.0*  PLT 950*   BMET  Recent Labs  04/01/15 0457  NA 135  K 3.1*  CL 101  CO2 24  GLUCOSE 124*  BUN 11  CREATININE 1.03  CALCIUM 9.5   PT/INR No results for input(s): LABPROT, INR in the last 72 hours. Comprehensive Metabolic Panel:    Component Value Date/Time   NA 135 04/01/2015 0457   NA 133* 03/29/2015 0456   K 3.1* 04/01/2015 0457   K 3.7 03/29/2015 0456   CL 101 04/01/2015 0457   CL 100* 03/29/2015 0456   CO2 24 04/01/2015 0457   CO2 23 03/29/2015 0456   BUN 11 04/01/2015 0457   BUN 11 03/29/2015 0456   CREATININE 1.03 04/01/2015 0457   CREATININE 1.04 03/29/2015 0456   GLUCOSE 124* 04/01/2015 0457   GLUCOSE 137* 03/29/2015 0456   CALCIUM 9.5 04/01/2015 0457   CALCIUM 9.8 03/29/2015 0456   AST 29 03/23/2015 0340   AST 34 03/18/2015 1859   ALT 23 03/23/2015 0340   ALT 31 03/18/2015 1859    ALKPHOS 155* 03/23/2015 0340   ALKPHOS 147* 03/18/2015 1859   BILITOT 0.9 03/23/2015 0340   BILITOT 0.7 03/18/2015 1859   PROT 6.5 03/23/2015 0340   PROT 7.1 03/18/2015 1859   ALBUMIN 2.2* 03/23/2015 0340   ALBUMIN 2.7* 03/18/2015 1859    Studies/Results: Dg Chest 1 View  03/30/2015  CLINICAL DATA:  Attempted LEFT thoracentesis EXAM: CHEST 1 VIEW COMPARISON:  03/20/2015 FINDINGS: Rotation to the RIGHT. Enlargement of cardiac silhouette. Pulmonary vascular congestion. Large peripheral opacity at the lateral aspect of the LEFT hemi thorax extending from above the aortic arch to the diaphragm likely representing a loculated pleural fluid collection. This is significantly increased since 03/20/2015. Significant compressive atelectasis of the mid to lower LEFT lung. RIGHT lung grossly clear. No pneumothorax. Bones demineralized. IMPRESSION: Significant interval increase in size of a large loculated LEFT pleural collection since 03/20/2015. Electronically Signed   By: Lavonia Dana M.D.   On: 03/30/2015 17:11   Dg Chest Port 1 View  04/01/2015  CLINICAL DATA:  Chest tube placement. EXAM: PORTABLE CHEST 1 VIEW COMPARISON:  CT 03/31/2015 .  03/30/2015. FINDINGS: Left chest tube in good anatomic position with significant reduction in large loculated left pleural effusion. No pneumothorax. Pulmonary apices not completely imaged. Stable cardiomegaly. Low lung volumes with basilar atelectasis. Surgical clips upper abdomen.  Drainage catheter left upper abdomen. IMPRESSION: 1. Interim placement of left chest tube with significant reduction in large loculated left pleural effusion. 2.  Stable cardiomegaly.  Low lung volumes with basilar atelectasis. 3.  Left upper quadrant drainage catheter stable position. Electronically Signed   By: [name]  Register   On: 04/01/2015 07:05   Ct Image Guided Drainage By Percutaneous Catheter  03/31/2015  INDICATION: History of distal pancreatectomy and splenectomy complicated by a  postoperative fluid collections worrisome for pseudocyst. Patient underwent CT-guided percutaneous drainage catheter placement x2 within in the pancreatic resection bed and anterior aspect of the mid abdomen on 03/20/2015. Repeat CT of the abdomen pelvis performed 03/29/2015 demonstrates development of a moderate partially loculated left-sided pleural effusion which was not amenable to ultrasound-guided thoracentesis. As such, request made for placement of a CT-guided chest tube. Additionally, the previously placed drainage catheter within the anterior aspect of the mid abdomen appears to be retracted from its original positioning and as such, attempt solely made to both exchange, reposition and potentially up size this catheter. EXAM: 1. CT-GUIDED LEFT-SIDED PLEURAL DRAINAGE CATHETER PLACEMENT 2. CT-GUIDED LEFT LATERAL ANTERIOR ABDOMINAL PERCUTANEOUS DRAINAGE CATHETER EXCHANGE, MANIPULATION AND UP SIZING. COMPARISON:  CT abdomen pelvis- 03/29/2015; 03/19/2015; CT-guided percutaneous drainage catheter placement x2 - 03/20/2015 MEDICATIONS: The patient is currently admitted to the hospital and receiving intravenous antibiotics. The antibiotics were administered within an appropriate time frame prior to the initiation of the procedure. ANESTHESIA/SEDATION: Moderate (conscious) sedation was employed during this procedure. A total of Versed 1.5 mg and Fentanyl 50 mcg was administered intravenously. Moderate Sedation Time: 60 minutes. The patient's level of consciousness and vital signs were monitored continuously by radiology nursing throughout the procedure under my direct supervision. CONTRAST:  None COMPLICATIONS: None immediate. PROCEDURE: Informed written consent was obtained from the patient after a discussion of the risks, benefits and alternatives to treatment. The patient was placed supine on the CT gantry and a pre procedural CT was performed re-demonstrating the known partially loculated fluid collection  within in the left pleural space. The procedure was planned. A timeout was performed prior to the initiation of the procedure. The skin overlying the left lateral chest was prepped and draped in the usual sterile fashion. The overlying soft tissues were anesthetized with 1% lidocaine with epinephrine. Appropriate trajectory was planned with the use of a 22 gauge spinal needle. An 18 gauge trocar needle was advanced into the abscess/fluid collection and a short Amplatz super stiff wire was coiled within the left pleural space. Appropriate positioning was confirmed with a limited CT scan. The tract was serially dilated allowing placement of a 12 [place] all-purpose drainage catheter. Appropriate positioning was confirmed with a limited postprocedural CT scan. Approximately 450 cc of serous fluid was aspirated. A small sample of aspirated fluid was capped and sent to the laboratory for culture as well as amylase and lipase levels. The tube was connected to a thorax device and sutured in place. A dressing was placed. -------------------------------------------- Attention was now paid towards the malpositioned existing percutaneous drainage catheter within the anterior aspect of the left mid hemi abdomen. Preprocedural CT scanning demonstrated persistent malpositioning of the percutaneous drainage catheter with end coiled and locked within the lateral most aspect of the residual complex fluid collection (series 9). The external portion of the existing percutaneous drainage catheter as well as the surrounding skin were prepped and draped in usual sterile fashion. The external portion of the percutaneous catheter was cut and cavity with an Amplatz wire which  was advanced into the more central aspect of the residual complex fluid collection. Appropriate positioning was confirmed with limited CT imaging, the track was dilated and ultimately allowing placement of a new 12 French percutaneous drainage catheter. Completion  imaging demonstrated improved positioning of the percutaneous drainage catheter with end coiled and locked within the caudal most aspect of the residual complex fluid collection (series 14). Approximately 75 cc of frankly purulent material was aspirated from this residual collection the percutaneous drainage catheter was secured at the skin exit site within interrupted suture. The percutaneous drainage catheter was connected to a JP bulb. A dressing was placed. The patient tolerated both above procedures well without immediate postprocedural complication. IMPRESSION: 1. Successful CT guided placement of a 12 French all purpose drain catheter into the left pleural space with aspiration of approximately 450 cc of serous fluid. A small sample of aspirated fluid was sent to the laboratory for culture as well as amylase and lipase levels. 2. Successful CT-guided exchange, repositioning and up sizing of a now 55 French percutaneous drainage catheter with and coiled and locked within the caudal aspect of the residual complex fluid collection with the left mid abdomen. This percutaneous drainage catheter is now connected to a JP bulb. Above findings discussed with Dr. Harlow Asa at the time of procedure completion. Electronically Signed   By: Sandi Mariscal M.D.   On: 03/31/2015 16:42   Ct Image Guided Drainage Percut Cath  Peritoneal Retroperit  03/31/2015  INDICATION: History of distal pancreatectomy and splenectomy complicated by a postoperative fluid collections worrisome for pseudocyst. Patient underwent CT-guided percutaneous drainage catheter placement x2 within in the pancreatic resection bed and anterior aspect of the mid abdomen on 03/20/2015. Repeat CT of the abdomen pelvis performed 03/29/2015 demonstrates development of a moderate partially loculated left-sided pleural effusion which was not amenable to ultrasound-guided thoracentesis. As such, request made for placement of a CT-guided chest tube. Additionally, the  previously placed drainage catheter within the anterior aspect of the mid abdomen appears to be retracted from its original positioning and as such, attempt solely made to both exchange, reposition and potentially up size this catheter. EXAM: 1. CT-GUIDED LEFT-SIDED PLEURAL DRAINAGE CATHETER PLACEMENT 2. CT-GUIDED LEFT LATERAL ANTERIOR ABDOMINAL PERCUTANEOUS DRAINAGE CATHETER EXCHANGE, MANIPULATION AND UP SIZING. COMPARISON:  CT abdomen pelvis- 03/29/2015; 03/19/2015; CT-guided percutaneous drainage catheter placement x2 - 03/20/2015 MEDICATIONS: The patient is currently admitted to the hospital and receiving intravenous antibiotics. The antibiotics were administered within an appropriate time frame prior to the initiation of the procedure. ANESTHESIA/SEDATION: Moderate (conscious) sedation was employed during this procedure. A total of Versed 1.5 mg and Fentanyl 50 mcg was administered intravenously. Moderate Sedation Time: 60 minutes. The patient's level of consciousness and vital signs were monitored continuously by radiology nursing throughout the procedure under my direct supervision. CONTRAST:  None COMPLICATIONS: None immediate. PROCEDURE: Informed written consent was obtained from the patient after a discussion of the risks, benefits and alternatives to treatment. The patient was placed supine on the CT gantry and a pre procedural CT was performed re-demonstrating the known partially loculated fluid collection within in the left pleural space. The procedure was planned. A timeout was performed prior to the initiation of the procedure. The skin overlying the left lateral chest was prepped and draped in the usual sterile fashion. The overlying soft tissues were anesthetized with 1% lidocaine with epinephrine. Appropriate trajectory was planned with the use of a 22 gauge spinal needle. An 18 gauge trocar needle was advanced into the  abscess/fluid collection and a short Amplatz super stiff wire was coiled within  the left pleural space. Appropriate positioning was confirmed with a limited CT scan. The tract was serially dilated allowing placement of a 12 [place] all-purpose drainage catheter. Appropriate positioning was confirmed with a limited postprocedural CT scan. Approximately 450 cc of serous fluid was aspirated. A small sample of aspirated fluid was capped and sent to the laboratory for culture as well as amylase and lipase levels. The tube was connected to a thorax device and sutured in place. A dressing was placed. -------------------------------------------- Attention was now paid towards the malpositioned existing percutaneous drainage catheter within the anterior aspect of the left mid hemi abdomen. Preprocedural CT scanning demonstrated persistent malpositioning of the percutaneous drainage catheter with end coiled and locked within the lateral most aspect of the residual complex fluid collection (series 9). The external portion of the existing percutaneous drainage catheter as well as the surrounding skin were prepped and draped in usual sterile fashion. The external portion of the percutaneous catheter was cut and cavity with an Amplatz wire which was advanced into the more central aspect of the residual complex fluid collection. Appropriate positioning was confirmed with limited CT imaging, the track was dilated and ultimately allowing placement of a new 12 French percutaneous drainage catheter. Completion imaging demonstrated improved positioning of the percutaneous drainage catheter with end coiled and locked within the caudal most aspect of the residual complex fluid collection (series 14). Approximately 75 cc of frankly purulent material was aspirated from this residual collection the percutaneous drainage catheter was secured at the skin exit site within interrupted suture. The percutaneous drainage catheter was connected to a JP bulb. A dressing was placed. The patient tolerated both above procedures  well without immediate postprocedural complication. IMPRESSION: 1. Successful CT guided placement of a 12 French all purpose drain catheter into the left pleural space with aspiration of approximately 450 cc of serous fluid. A small sample of aspirated fluid was sent to the laboratory for culture as well as amylase and lipase levels. 2. Successful CT-guided exchange, repositioning and up sizing of a now 55 French percutaneous drainage catheter with and coiled and locked within the caudal aspect of the residual complex fluid collection with the left mid abdomen. This percutaneous drainage catheter is now connected to a JP bulb. Above findings discussed with Dr. Harlow Asa at the time of procedure completion. Electronically Signed   By: Sandi Mariscal M.D.   On: 03/31/2015 16:42   Ir Thoracentesis Asp Pleural Space W/img Guide  03/30/2015  CLINICAL DATA:  70 year old male with a history of pancreatic pseudocyst and abdominal abscesses with presumed left sympathetic effusion/empyema. He has been referred for sampling. EXAM: LIMITED ULTRASOUND LEFT CHEST : COMPARISON: CT 03/29/2015, 03/20/2015 PROCEDURE: An ultrasound guided thoracentesis was thoroughly discussed with the patient and questions answered. The benefits, risks, alternatives and complications were also discussed. The patient understands and wishes to proceed with the procedure. Written consent was obtained. Ultrasound survey performed the left chest. Small window for a posterior approach, with ultrasound-guided thoracentesis attempted. The area was prepped and draped in the normal sterile fashion. 1% Lidocaine was used for local anesthesia. Under ultrasound guidance a 19 gauge Yueh catheter was introduced. No significant fluid was aspirated from small fluid collection. Second site was attempted. Patient tolerated the procedure well and remained hemodynamically stable throughout. No significant blood loss encountered.  No complications. FINDINGS: Small fluid  pocket from posterior approach to the chest. No significant fluid was aspirated.  IMPRESSION: Status post left ultrasound-guided lower with no significant fluid aspirated from small pocket from posterior thoracic approach. The patient will be rescheduled for a CT guided aspiration/drainage of pleural effusion from anterior lateral approach. Signed, Dulcy Fanny. Earleen Newport, DO [facility] Electronically Signed   By: Corrie Mckusick D.O.   On: 03/30/2015 16:57    Assessment & Plans: Status post distal pancreatectomy, splenectomy for neuroendocrine tumor of pancreas IV vancomycin and Invanz - plan to discontinue on 2/11 (Saturday) for total of 14 days Thoracentesis completed with serous fluid - doubt infection Drains repositioned yesterday by IR - drain adjacent to pancreas with minimal output Overall making progress. Plan to stop abx on Saturday.  Hopefully thoracostomy drain out in 1-2 days.  Tentatively planning discharge to [facility] for rehab on Monday 2/13.  Will have follow up at [place] office and in the IR drain clinic  Discussed potential treatment options with patient and daughter again today.  At present, continuing to make slow progress.  [name], MD, [facility] Office: [place] 04/01/2015

## 2015-04-01 NOTE — Progress Notes (Signed)
CSW continuing to follow.   CSW met with pt and pt wife at bedside to provide support.   Pt wife updated CSW regarding anticipation for discharge on Monday and that pt would complete IV antibiotics while still in hospital and pleurx drain will be removed in 1 to 2 days. Pt wife feels rehab at SNF will be best plan upon discharge. Pt wife is agreeable to CSW re-initiating SNF search to determine if there are any other options available.  CSW updated pt FL2 and re-initiated SNF search given original SNF search was done a week ago.   CSW to follow up with pt and pt wife tomorrow.   CSW to continue to follow to provide support and assist with pt disposition planning.  Alison Murray, MSW, [facility] [phone]

## 2015-04-01 NOTE — NC FL2 (Signed)
[place] LEVEL OF CARE SCREENING TOOL     IDENTIFICATION  Patient Name: Derek AMER Sr. Birthdate: [birth year] Sex: male Admission Date (Current Location): 03/18/2015  [facility] and [place] Number:  Herbalist and Address:  [facility],  [place], [place]      Provider Number: [id number]  Attending Physician Name and Address:  Armandina Gemma, MD  Relative Name and Phone Number:       Current Level of Care: Hospital Recommended Level of Care: [place] Prior Approval Number:    Date Approved/Denied:   PASRR Number: [id number]  Discharge Plan: SNF    Current Diagnoses: Patient Active Problem List   Diagnosis Date Noted  . Pleural effusion, left   . Debility   . Benign essential HTN   . Leukocytosis   . Thrombocytosis ([place])   . Intra-abdominal abscess ([place])   . Abdominal pain 03/18/2015  . Poorly controlled type 2 diabetes mellitus ([place])   . Acute respiratory acidosis   . 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 Hyponatremia 06/03/2013  . OA (osteoarthritis) of knee 06/02/2013  . Anal fissure 02/04/2013  . Meniere's disease   . GERD (gastroesophageal reflux disease)   . Kidney stone   . Hypertension   . Obstructive sleep apnea   . Morbid obesity ([place])   . Synovitis of knee 03/01/2012  . Chronic cough 11/03/2011  . COSTOCHONDRITIS, LEFT 10/08/2009  . RIB PAIN, LEFT SIDED 10/08/2009    Orientation RESPIRATION BLADDER Height & Weight     Self, Time, Situation, Place  Normal Continent Weight: 238 lb 12.1 oz (108.3 kg) Height:  6\' 2"  (188 cm)  BEHAVIORAL SYMPTOMS/MOOD NEUROLOGICAL BOWEL NUTRITION STATUS   (n/a)  (NONE) Continent Diet (Diet Regular)  AMBULATORY STATUS COMMUNICATION OF NEEDS Skin   Limited Assist (+2 for safety/equipment) Verbally Other (Comment) (Left side JP drain, empty PRN & keep suctioned, Right side perc drain,  Flushing regimen: Flush 2 catheters with normal Saline 5 mL three times daily. )                       Personal Care Assistance Level of Assistance  Bathing, Feeding, Dressing Bathing Assistance: Limited assistance Feeding assistance: Independent Dressing Assistance: Limited assistance     Functional Limitations Info  Sight, Hearing, Speech Sight Info: Adequate Hearing Info: Impaired Speech Info: Adequate    SPECIAL CARE FACTORS FREQUENCY  PT (By licensed PT), OT (By licensed OT)     PT Frequency: 5 x a week OT Frequency: 5 x a week            Contractures Contractures Info: Not present    Additional Factors Info  Code Status, Allergies, Isolation Precautions, Insulin Sliding Scale Code Status Info: FULL code status Allergies Info: Morphine And Related, Oxycontin, Quinine, Voltaren     Isolation Precautions Info: Contact Precautions: 1.28.17 mrsa by pcr     Current Medications (04/01/2015):  This is the current hospital active medication list Current Facility-Administered Medications  Medication Dose Route Frequency Provider Last Rate Last Dose  . 0.45 % NaCl with KCl 20 mEq / L infusion   Intravenous Continuous Armandina Gemma, MD      . acetaminophen (TYLENOL) tablet 650 mg  650 mg Oral Q4H PRN Armandina Gemma, MD      . aspirin tablet 325 mg  325 mg Oral Daily Armandina Gemma, MD   325 mg  at 04/01/15 0909  . ertapenem (INVANZ) 1 g in sodium chloride 0.9 % 50 mL IVPB  1 g Intravenous Q24H Armandina Gemma, MD   1 g at 03/31/15 1719  . feeding supplement (GLUCERNA SHAKE) (GLUCERNA SHAKE) liquid 237 mL  237 mL Oral TID BM Eugenie Filler, MD   237 mL at 03/30/15 0950  . heparin injection 5,000 Units  5,000 Units Subcutaneous 3 times per day Armandina Gemma, MD   5,000 Units at 04/01/15 1500  . HYDROcodone-acetaminophen (NORCO/VICODIN) 5-325 MG per tablet 1-2 tablet  1-2 tablet Oral Q4H PRN Armandina Gemma, MD   2 tablet at 04/01/15 1505  . HYDROmorphone (DILAUDID) tablet 1 mg  1 mg Oral  Q3H PRN Armandina Gemma, MD   1 mg at 04/01/15 0909  . insulin aspart (novoLOG) injection 0-15 Units  0-15 Units Subcutaneous 6 times per day Armandina Gemma, MD   2 Units at 04/01/15 0855  . magic mouthwash w/lidocaine  15 mL Oral QID Armandina Gemma, MD   15 mL at 04/01/15 1500  . ondansetron (ZOFRAN-ODT) disintegrating tablet 4 mg  4 mg Oral Q6H PRN Armandina Gemma, MD       Or  . ondansetron [facility]) injection 4 mg  4 mg Intravenous Q6H PRN Armandina Gemma, MD   4 mg at 03/31/15 1205  . pantoprazole (PROTONIX) EC tablet 40 mg  40 mg Oral QHS Polly Cobia, RPH   40 mg at 03/31/15 2125  . traMADol (ULTRAM) tablet 50 mg  50 mg Oral Q6H Stark Klein, MD   50 mg at 04/01/15 1500  . vancomycin (VANCOCIN) IVPB 1000 mg/200 mL premix  1,000 mg Intravenous Q12H Randa Spike, RPH   1,000 mg at 04/01/15 [id number]     Discharge Medications: Please see discharge summary for a list of discharge medications.  Relevant Imaging Results:  Relevant Lab Results:   Additional Information SSN: [id number]. Pt currently has PleurX cathether, but per MD, plan to remove on 04/02/15.   [name], Derek A, LCSW

## 2015-04-01 NOTE — Progress Notes (Signed)
Patient ID: Derek Mohr Sr., male   DOB: 27-Oct-1945, 70 y.o.   MRN: [id number]    Referring Physician(s): Gerkin  Chief Complaint: Intra-abdominal fluid collections, left pleural effusion  Subjective: Patient says he feels like "crap"   Allergies: Morphine and related; Oxycontin; Quinine; and Voltaren  Medications: Prior to Admission medications   Medication Sig Start Date End Date Taking? Authorizing Provider  carvedilol (COREG) 25 MG tablet Take 25 mg by mouth 2 (two) times daily with a meal.  09/26/11  Yes Historical Provider, MD  [employer] TRIPLE MAGNESIUM COMPLEX PO Take 1-2 capsules by mouth 2 (two) times daily. Two capsules in the morning and one at night   Yes Historical Provider, MD  diphenhydrAMINE (BENADRYL) 25 mg capsule Take 50 mg by mouth 2 (two) times daily. Reported on 03/17/2015   Yes Historical Provider, MD  HYDROmorphone (DILAUDID) 2 MG tablet Take 1 tablet (2 mg total) by mouth every 3 (three) hours as needed for moderate pain or severe pain. 03/03/15  Yes Armandina Gemma, MD  insulin aspart (NOVOLOG) 100 UNIT/ML injection Inject 0-9 Units into the skin 3 (three) times daily before meals. 03/03/15  Yes Armandina Gemma, MD  insulin glargine (LANTUS) 100 UNIT/ML injection Inject 0.2 mLs (20 Units total) into the skin daily. 03/03/15  Yes Armandina Gemma, MD  losartan (COZAAR) 50 MG tablet Take 50 mg by mouth daily. 11/06/14  Yes Historical Provider, MD  naproxen sodium (ALEVE) 220 MG tablet Take 440 mg by mouth 2 (two) times daily with a meal.    Yes Historical Provider, MD  aspirin EC 81 MG tablet Take 81 mg by mouth at bedtime.    Historical Provider, MD  clobetasol ointment (TEMOVATE) [id number] % Apply 1 application topically daily as needed (for peeling skin on hands and feet). Reported on 03/17/2015    Historical Provider, MD  Insulin Syringes, Disposable, U-100 0.5 ML MISC 20 Units by Does not apply route daily. 03/03/15   Armandina Gemma, MD  lansoprazole (PREVACID) 15 MG capsule Take 30 mg by  mouth daily as needed (reflux). Reported on 03/17/2015    Historical Provider, MD  [facility] DELICA LANCETS [id number] MISC 1 Device by Does not apply route 4 (four) times daily. 03/17/15   Renato Shin, MD    Vital Signs: BP 150/69 mmHg  Pulse 82  Temp(Src) 97.5 F (36.4 C) (Oral)  Resp 20  Ht 6\' 2"  (1.88 m)  Wt 238 lb 12.1 oz (108.3 kg)  BMI 30.64 kg/m2  SpO2 93%  Physical Exam: Chest: CTAB, left Chest catheter in place with serous drainage, 170cc/24hrs.  No air leak Abd: soft, minimally tender, drain 1 repositioned yesterday and now to bulb suction.  This is mostly seropurulent drainage, 75cc/24hrs.  Drain #2 remains in place to gravity bag with 55cc/24hrs  Imaging: Dg Chest 1 View  03/30/2015  CLINICAL DATA:  Attempted LEFT thoracentesis EXAM: CHEST 1 VIEW COMPARISON:  03/20/2015 FINDINGS: Rotation to the RIGHT. Enlargement of cardiac silhouette. Pulmonary vascular congestion. Large peripheral opacity at the lateral aspect of the LEFT hemi thorax extending from above the aortic arch to the diaphragm likely representing a loculated pleural fluid collection. This is significantly increased since 03/20/2015. Significant compressive atelectasis of the mid to lower LEFT lung. RIGHT lung grossly clear. No pneumothorax. Bones demineralized. IMPRESSION: Significant interval increase in size of a large loculated LEFT pleural collection since 03/20/2015. Electronically Signed   By: Lavonia Dana M.D.   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 Sagittal and coronal MPR images reconstructed from axial data set. CONTRAST:  131mL OMNIPAQUE  IOHEXOL 300 MG/ML SOLN IV. Dilute oral contrast. COMPARISON:  03/20/2015, 03/19/2015 FINDINGS: Loculated pleural fluid collection at LEFT lung base, new, 15 x 7 cm. Associated compressive atelectasis of LEFT lower lobe. Minimal dependent atelectasis RIGHT lower lobe. Tiny LEFT renal cyst stable. Liver, kidneys, and adrenal glands otherwise normal. Post distal pancreatectomy and splenectomy with residual pancreas normal appearance. Significantly decreased size of fluid collection at the pancreatic bed post percutaneous drainage, with only a small amount of fluid identified near the pancreatic stump. Large gas and fluid collection in the in lateral LEFT mid abdomen measures 10.5 x 8.2 x 8.7 cm, mildly decreased in size since the previous exam but demonstrating less significant response to percutaneous drainage. Percutaneous drainage catheter remains at lateral aspect of this collection, toward superior end. No new intra-abdominal or intrapelvic fluid collections. Few scattered normal size mesenteric and retroperitoneal lymph nodes without discrete adenopathy. Moderate-sized hiatal hernia. Stomach and bowel loops otherwise normal appearance. Large RIGHT posterior bladder diverticulum, bladder and ureters otherwise normal. LEFT inguinal hernia containing fat. Tiny focus of gas within the LEFT rectus sheath image 46 not definitely seen previously. No free intraperitoneal air or acute osseous findings. IMPRESSION: Decrease in size of postoperative fluid collection at the bed of the pancreatic tail post percutaneous drainage. Mild decrease in size of additional LEFT upper quadrant postoperative collection though demonstrating a elss significant response than the pancreatic tail collection. New loculated LEFT pleural effusion. Single tiny focus of nonspecific gas within the LEFT rectus sheath; potential this could be related to interval percutaneous drainage procedure, recommend attention on follow-up imaging.  Electronically Signed   By: Lavonia Dana M.D.   On: 03/29/2015 09:47   Dg Chest Port 1 View  04/01/2015  CLINICAL DATA:  Chest tube placement. EXAM: PORTABLE CHEST 1 VIEW COMPARISON:  CT 03/31/2015 .  03/30/2015. FINDINGS: Left chest tube in good anatomic position with significant reduction in large loculated left pleural effusion. No pneumothorax. Pulmonary apices not completely imaged. Stable cardiomegaly. Low lung volumes with basilar atelectasis. Surgical clips upper abdomen. Drainage catheter left upper abdomen. IMPRESSION: 1. Interim placement of left chest tube with significant reduction in large loculated left pleural effusion. 2.  Stable cardiomegaly.  Low lung volumes with basilar atelectasis. 3.  Left upper quadrant drainage catheter stable position. Electronically Signed   By: [name]  Register   On: 04/01/2015 07:05   Ct Image Guided Drainage By Percutaneous Catheter  03/31/2015  INDICATION: History of distal pancreatectomy and splenectomy complicated by a postoperative fluid collections worrisome for pseudocyst. Patient underwent CT-guided percutaneous drainage catheter placement x2 within in the pancreatic resection bed and anterior aspect of the mid abdomen on 03/20/2015. Repeat CT of the abdomen pelvis performed 03/29/2015 demonstrates development of a moderate partially loculated left-sided pleural effusion which was not amenable to ultrasound-guided thoracentesis. As such, request made for placement of a CT-guided chest tube. Additionally, the previously placed drainage catheter within the anterior aspect of the mid abdomen appears to be retracted from its original positioning and as such, attempt solely made to both exchange, reposition and potentially up size this catheter. EXAM:  1. CT-GUIDED LEFT-SIDED PLEURAL DRAINAGE CATHETER PLACEMENT 2. CT-GUIDED LEFT LATERAL ANTERIOR ABDOMINAL PERCUTANEOUS DRAINAGE CATHETER EXCHANGE, MANIPULATION AND UP SIZING. COMPARISON:  CT abdomen pelvis- 03/29/2015;  03/19/2015; CT-guided percutaneous drainage catheter placement x2 - 03/20/2015 MEDICATIONS: The patient is currently admitted to the hospital and receiving intravenous antibiotics. The antibiotics were administered within an appropriate time frame prior to the initiation of the procedure. ANESTHESIA/SEDATION: Moderate (conscious) sedation was employed during this procedure. A total of Versed 1.5 mg and Fentanyl 50 mcg was administered intravenously. Moderate Sedation Time: 60 minutes. The patient's level of consciousness and vital signs were monitored continuously by radiology nursing throughout the procedure under my direct supervision. CONTRAST:  None COMPLICATIONS: None immediate. PROCEDURE: Informed written consent was obtained from the patient after a discussion of the risks, benefits and alternatives to treatment. The patient was placed supine on the CT gantry and a pre procedural CT was performed re-demonstrating the known partially loculated fluid collection within in the left pleural space. The procedure was planned. A timeout was performed prior to the initiation of the procedure. The skin overlying the left lateral chest was prepped and draped in the usual sterile fashion. The overlying soft tissues were anesthetized with 1% lidocaine with epinephrine. Appropriate trajectory was planned with the use of a 22 gauge spinal needle. An 18 gauge trocar needle was advanced into the abscess/fluid collection and a short Amplatz super stiff wire was coiled within the left pleural space. Appropriate positioning was confirmed with a limited CT scan. The tract was serially dilated allowing placement of a 12 [place] all-purpose drainage catheter. Appropriate positioning was confirmed with a limited postprocedural CT scan. Approximately 450 cc of serous fluid was aspirated. A small sample of aspirated fluid was capped and sent to the laboratory for culture as well as amylase and lipase levels. The tube was connected to a  thorax device and sutured in place. A dressing was placed. -------------------------------------------- Attention was now paid towards the malpositioned existing percutaneous drainage catheter within the anterior aspect of the left mid hemi abdomen. Preprocedural CT scanning demonstrated persistent malpositioning of the percutaneous drainage catheter with end coiled and locked within the lateral most aspect of the residual complex fluid collection (series 9). The external portion of the existing percutaneous drainage catheter as well as the surrounding skin were prepped and draped in usual sterile fashion. The external portion of the percutaneous catheter was cut and cavity with an Amplatz wire which was advanced into the more central aspect of the residual complex fluid collection. Appropriate positioning was confirmed with limited CT imaging, the track was dilated and ultimately allowing placement of a new 12 French percutaneous drainage catheter. Completion imaging demonstrated improved positioning of the percutaneous drainage catheter with end coiled and locked within the caudal most aspect of the residual complex fluid collection (series 14). Approximately 75 cc of frankly purulent material was aspirated from this residual collection the percutaneous drainage catheter was secured at the skin exit site within interrupted suture. The percutaneous drainage catheter was connected to a JP bulb. A dressing was placed. The patient tolerated both above procedures well without immediate postprocedural complication. IMPRESSION: 1. Successful CT guided placement of a 12 French all purpose drain catheter into the left pleural space with aspiration of approximately 450 cc of serous fluid. A small sample of aspirated fluid was sent to the laboratory for culture as well as amylase and lipase levels. 2. Successful CT-guided exchange, repositioning and up sizing of a now 2 French percutaneous drainage catheter  with and coiled  and locked within the caudal aspect of the residual complex fluid collection with the left mid abdomen. This percutaneous drainage catheter is now connected to a JP bulb. Above findings discussed with Dr. Harlow Asa at the time of procedure completion. Electronically Signed   By: Sandi Mariscal M.D.   On: 03/31/2015 16:42   Ct Image Guided Drainage Percut Cath  Peritoneal Retroperit  03/31/2015  INDICATION: History of distal pancreatectomy and splenectomy complicated by a postoperative fluid collections worrisome for pseudocyst. Patient underwent CT-guided percutaneous drainage catheter placement x2 within in the pancreatic resection bed and anterior aspect of the mid abdomen on 03/20/2015. Repeat CT of the abdomen pelvis performed 03/29/2015 demonstrates development of a moderate partially loculated left-sided pleural effusion which was not amenable to ultrasound-guided thoracentesis. As such, request made for placement of a CT-guided chest tube. Additionally, the previously placed drainage catheter within the anterior aspect of the mid abdomen appears to be retracted from its original positioning and as such, attempt solely made to both exchange, reposition and potentially up size this catheter. EXAM: 1. CT-GUIDED LEFT-SIDED PLEURAL DRAINAGE CATHETER PLACEMENT 2. CT-GUIDED LEFT LATERAL ANTERIOR ABDOMINAL PERCUTANEOUS DRAINAGE CATHETER EXCHANGE, MANIPULATION AND UP SIZING. COMPARISON:  CT abdomen pelvis- 03/29/2015; 03/19/2015; CT-guided percutaneous drainage catheter placement x2 - 03/20/2015 MEDICATIONS: The patient is currently admitted to the hospital and receiving intravenous antibiotics. The antibiotics were administered within an appropriate time frame prior to the initiation of the procedure. ANESTHESIA/SEDATION: Moderate (conscious) sedation was employed during this procedure. A total of Versed 1.5 mg and Fentanyl 50 mcg was administered intravenously. Moderate Sedation Time: 60 minutes. The patient's level of  consciousness and vital signs were monitored continuously by radiology nursing throughout the procedure under my direct supervision. CONTRAST:  None COMPLICATIONS: None immediate. PROCEDURE: Informed written consent was obtained from the patient after a discussion of the risks, benefits and alternatives to treatment. The patient was placed supine on the CT gantry and a pre procedural CT was performed re-demonstrating the known partially loculated fluid collection within in the left pleural space. The procedure was planned. A timeout was performed prior to the initiation of the procedure. The skin overlying the left lateral chest was prepped and draped in the usual sterile fashion. The overlying soft tissues were anesthetized with 1% lidocaine with epinephrine. Appropriate trajectory was planned with the use of a 22 gauge spinal needle. An 18 gauge trocar needle was advanced into the abscess/fluid collection and a short Amplatz super stiff wire was coiled within the left pleural space. Appropriate positioning was confirmed with a limited CT scan. The tract was serially dilated allowing placement of a 12 [place] all-purpose drainage catheter. Appropriate positioning was confirmed with a limited postprocedural CT scan. Approximately 450 cc of serous fluid was aspirated. A small sample of aspirated fluid was capped and sent to the laboratory for culture as well as amylase and lipase levels. The tube was connected to a thorax device and sutured in place. A dressing was placed. -------------------------------------------- Attention was now paid towards the malpositioned existing percutaneous drainage catheter within the anterior aspect of the left mid hemi abdomen. Preprocedural CT scanning demonstrated persistent malpositioning of the percutaneous drainage catheter with end coiled and locked within the lateral most aspect of the residual complex fluid collection (series 9). The external portion of the existing  percutaneous drainage catheter as well as the surrounding skin were prepped and draped in usual sterile fashion. The external portion of the percutaneous catheter was cut and cavity  with an Amplatz wire which was advanced into the more central aspect of the residual complex fluid collection. Appropriate positioning was confirmed with limited CT imaging, the track was dilated and ultimately allowing placement of a new 12 French percutaneous drainage catheter. Completion imaging demonstrated improved positioning of the percutaneous drainage catheter with end coiled and locked within the caudal most aspect of the residual complex fluid collection (series 14). Approximately 75 cc of frankly purulent material was aspirated from this residual collection the percutaneous drainage catheter was secured at the skin exit site within interrupted suture. The percutaneous drainage catheter was connected to a JP bulb. A dressing was placed. The patient tolerated both above procedures well without immediate postprocedural complication. IMPRESSION: 1. Successful CT guided placement of a 12 French all purpose drain catheter into the left pleural space with aspiration of approximately 450 cc of serous fluid. A small sample of aspirated fluid was sent to the laboratory for culture as well as amylase and lipase levels. 2. Successful CT-guided exchange, repositioning and up sizing of a now 32 French percutaneous drainage catheter with and coiled and locked within the caudal aspect of the residual complex fluid collection with the left mid abdomen. This percutaneous drainage catheter is now connected to a JP bulb. Above findings discussed with Dr. Harlow Asa at the time of procedure completion. Electronically Signed   By: Sandi Mariscal M.D.   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 He has been referred for sampling. EXAM: LIMITED ULTRASOUND LEFT CHEST : COMPARISON: CT 03/29/2015, 03/20/2015 PROCEDURE: An ultrasound guided thoracentesis was thoroughly discussed with the patient and questions answered. The benefits, risks, alternatives and complications were also discussed. The patient understands and wishes to proceed with the procedure. Written consent was obtained. Ultrasound survey performed the left chest. Small window for a posterior approach, with ultrasound-guided thoracentesis attempted. The area was prepped and draped in the normal sterile fashion. 1% Lidocaine was used for local anesthesia. Under ultrasound guidance a 19 gauge Yueh catheter was introduced. No significant fluid was aspirated from small fluid collection. Second site was attempted. Patient tolerated the procedure well and remained hemodynamically stable throughout. No significant blood loss encountered.  No complications. FINDINGS: Small fluid pocket from posterior approach to the chest. No significant fluid was aspirated. IMPRESSION: Status post left ultrasound-guided lower with no significant fluid aspirated from small pocket from posterior thoracic approach. The patient will be rescheduled for a CT guided aspiration/drainage of pleural effusion from anterior lateral approach. Signed, Dulcy Fanny. Earleen Newport, DO [facility] Electronically Signed   By: Corrie Mckusick D.O.   On: 03/30/2015 16:57    Labs:  CBC:  Recent Labs  03/23/15 0340 03/24/15 0330 03/26/15 0508 03/30/15 0529  WBC 15.5* 15.0* 13.2* 11.5*  HGB 9.7* 10.1* 9.4* 8.4*  HCT 31.5* 33.2* 30.8* 27.0*  PLT 898* 992* 936* 950*    COAGS:  Recent Labs  02/19/15 0920 03/18/15 1859  INR 1.07 1.33  APTT 28  --     BMP:  Recent Labs  03/26/15 0508 03/27/15 0544 03/29/15 0456 04/01/15 0457  NA 132* 130* 133* 135  K 4.2 3.9 3.7 3.1*  CL 98* 98* 100* 101   CO2 26 22 23 24   GLUCOSE 168* 154* 137* 124*  BUN 14 12 11 11   CALCIUM 10.4* 9.8  9.8 9.5  CREATININE 1.12 1.07 1.04 1.03  GFRNONAA >60 >60 >60 >60  GFRAA >60 >60 >60 >60    LIVER FUNCTION TESTS:  Recent Labs  02/19/15 0920 03/18/15 1859 03/23/15 0340  BILITOT 0.6 0.7 0.9  AST 19 34 29  ALT 23 31 23   ALKPHOS 83 147* 155*  PROT 6.9 7.1 6.5  ALBUMIN 4.2 2.7* 2.2*    Assessment and Plan: S/p distal pancreatectomy and splenectomy, now with postoperative fluid collections and pseudocyst formation; s/p left abd drain placement x2 by Dr. Annamaria Boots on 1/28, #1 repositioned by Dr. Pascal Lux 2-8 -cont both drains for now.  Still with good output Left pleural effusion, s/p CT aspiration and drain placement by Dr. Pascal Lux 2-8 -cont catheter in place. -no air leak -had 170cc out yesterday -will d/w Dr. Vernard Gambles regarding timing of duration of this catheter as this is hurting the patient quite a bit to move around.   Electronically Signed: Henreitta Cea 04/01/2015, 9:38 AM   I spent a total of 15 Minutes at the the patient's bedside AND on the patient's hospital floor or unit, greater than 50% of which was counseling/coordinating care for intra-abdominal abscesses, left pleural effusion, s/p multiple drain placements

## 2015-04-01 NOTE — Progress Notes (Signed)
Rehab admissions - I spoke with wife.  She is in agreement to SNF placement since she works and cannot provide care for patient after a potential rehab stay.  Social worker is currently working on a SNF bed.  I will sign off for acute inpatient rehab stay at this time.  RC:[id number]

## 2015-04-02 LAB — GLUCOSE, CAPILLARY
Glucose-Capillary: 107 mg/dL — ABNORMAL HIGH (ref 65–99)
Glucose-Capillary: 113 mg/dL — ABNORMAL HIGH (ref 65–99)
Glucose-Capillary: 127 mg/dL — ABNORMAL HIGH (ref 65–99)
Glucose-Capillary: 136 mg/dL — ABNORMAL HIGH (ref 65–99)
Glucose-Capillary: 136 mg/dL — ABNORMAL HIGH (ref 65–99)
Glucose-Capillary: 137 mg/dL — ABNORMAL HIGH (ref 65–99)

## 2015-04-02 MED ORDER — VANCOMYCIN HCL IN DEXTROSE 1-5 GM/200ML-% IV SOLN
1000.0000 mg | Freq: Two times a day (BID) | INTRAVENOUS | Status: AC
  Administered 2015-04-03 (×2): 1000 mg via INTRAVENOUS
  Filled 2015-04-02 (×2): qty 200

## 2015-04-02 MED ORDER — INSULIN ASPART 100 UNIT/ML ~~LOC~~ SOLN
0.0000 [IU] | Freq: Three times a day (TID) | SUBCUTANEOUS | Status: DC
  Administered 2015-04-03 (×3): 2 [IU] via SUBCUTANEOUS
  Administered 2015-04-03 – 2015-04-04 (×2): 3 [IU] via SUBCUTANEOUS
  Administered 2015-04-04: 2 [IU] via SUBCUTANEOUS
  Administered 2015-04-05 (×2): 3 [IU] via SUBCUTANEOUS
  Administered 2015-04-05: 2 [IU] via SUBCUTANEOUS
  Administered 2015-04-05: 3 [IU] via SUBCUTANEOUS
  Administered 2015-04-06: 2 [IU] via SUBCUTANEOUS
  Administered 2015-04-06: 3 [IU] via SUBCUTANEOUS

## 2015-04-02 NOTE — Progress Notes (Signed)
Patient ID: Derek Mohr Sr., male   DOB: [birth year], 70 y.o.   MRN: [id number]  [facility]  Subjective: Patient up to bedside commode.  Going to walk with PT now.  Complains of low back pain.  Objective: Vital signs in last 24 hours: Temp:  [97.5 F (36.4 C)-98.1 F (36.7 C)] 97.5 F (36.4 C) (02/10 0432) Pulse Rate:  [72-92] 92 (02/10 0432) Resp:  [18-20] 20 (02/10 0432) BP: (140-157)/(64-76) 157/76 mmHg (02/10 0432) SpO2:  [94 %-96 %] 94 % (02/10 0432) Last BM Date: 04/01/15  Intake/Output from previous day: 02/09 0701 - 02/10 0700 In: 1085 [I.V.:825; IV Piggyback:250] Out: [id number] [Urine:1550; Drains:230] Intake/Output this shift: Total I/O In: 5 [Other:5] Out: -   Physical Exam: HEENT - sclerae clear, mucous membranes moist Neck - soft Abdomen - BS present; minimal tenderness; drains with tan cloudy fluid; thoracostomy with thin serous fluid Ext - no edema, non-tender Neuro - alert & oriented, no focal deficits  Lab Results:  No results for input(s): WBC, HGB, HCT, PLT in the last 72 hours. BMET  Recent Labs  04/01/15 0457  NA 135  K 3.1*  CL 101  CO2 24  GLUCOSE 124*  BUN 11  CREATININE 1.03  CALCIUM 9.5   PT/INR No results for input(s): LABPROT, INR in the last 72 hours. Comprehensive Metabolic Panel:    Component Value Date/Time   NA 135 04/01/2015 0457   NA 133* 03/29/2015 0456   K 3.1* 04/01/2015 0457   K 3.7 03/29/2015 0456   CL 101 04/01/2015 0457   CL 100* 03/29/2015 0456   CO2 24 04/01/2015 0457   CO2 23 03/29/2015 0456   BUN 11 04/01/2015 0457   BUN 11 03/29/2015 0456   CREATININE 1.03 04/01/2015 0457   CREATININE 1.04 03/29/2015 0456   GLUCOSE 124* 04/01/2015 0457   GLUCOSE 137* 03/29/2015 0456   CALCIUM 9.5 04/01/2015 0457   CALCIUM 9.8 03/29/2015 0456   AST 29 03/23/2015 0340   AST 34 03/18/2015 1859   ALT 23 03/23/2015 0340   ALT 31 03/18/2015 1859   ALKPHOS 155* 03/23/2015 0340    ALKPHOS 147* 03/18/2015 1859   BILITOT 0.9 03/23/2015 0340   BILITOT 0.7 03/18/2015 1859   PROT 6.5 03/23/2015 0340   PROT 7.1 03/18/2015 1859   ALBUMIN 2.2* 03/23/2015 0340   ALBUMIN 2.7* 03/18/2015 1859    Studies/Results: Dg Chest Port 1 View  04/01/2015  CLINICAL DATA:  Chest tube placement. EXAM: PORTABLE CHEST 1 VIEW COMPARISON:  CT 03/31/2015 .  03/30/2015. FINDINGS: Left chest tube in good anatomic position with significant reduction in large loculated left pleural effusion. No pneumothorax. Pulmonary apices not completely imaged. Stable cardiomegaly. Low lung volumes with basilar atelectasis. Surgical clips upper abdomen. Drainage catheter left upper abdomen. IMPRESSION: 1. Interim placement of left chest tube with significant reduction in large loculated left pleural effusion. 2.  Stable cardiomegaly.  Low lung volumes with basilar atelectasis. 3.  Left upper quadrant drainage catheter stable position. Electronically Signed   By: [name]  Register   On: 04/01/2015 07:05   Ct Image Guided Drainage By Percutaneous Catheter  03/31/2015  INDICATION: History of distal pancreatectomy and splenectomy complicated by a postoperative fluid collections worrisome for pseudocyst. Patient underwent CT-guided percutaneous drainage catheter placement x2 within in the pancreatic resection bed and anterior aspect of the mid abdomen on 03/20/2015. Repeat CT of the abdomen pelvis performed 03/29/2015 demonstrates development of a moderate partially loculated left-sided  pleural effusion which was not amenable to ultrasound-guided thoracentesis. As such, request made for placement of a CT-guided chest tube. Additionally, the previously placed drainage catheter within the anterior aspect of the mid abdomen appears to be retracted from its original positioning and as such, attempt solely made to both exchange, reposition and potentially up size this catheter. EXAM: 1. CT-GUIDED LEFT-SIDED PLEURAL DRAINAGE CATHETER  PLACEMENT 2. CT-GUIDED LEFT LATERAL ANTERIOR ABDOMINAL PERCUTANEOUS DRAINAGE CATHETER EXCHANGE, MANIPULATION AND UP SIZING. COMPARISON:  CT abdomen pelvis- 03/29/2015; 03/19/2015; CT-guided percutaneous drainage catheter placement x2 - 03/20/2015 MEDICATIONS: The patient is currently admitted to the hospital and receiving intravenous antibiotics. The antibiotics were administered within an appropriate time frame prior to the initiation of the procedure. ANESTHESIA/SEDATION: Moderate (conscious) sedation was employed during this procedure. A total of Versed 1.5 mg and Fentanyl 50 mcg was administered intravenously. Moderate Sedation Time: 60 minutes. The patient's level of consciousness and vital signs were monitored continuously by radiology nursing throughout the procedure under my direct supervision. CONTRAST:  None COMPLICATIONS: None immediate. PROCEDURE: Informed written consent was obtained from the patient after a discussion of the risks, benefits and alternatives to treatment. The patient was placed supine on the CT gantry and a pre procedural CT was performed re-demonstrating the known partially loculated fluid collection within in the left pleural space. The procedure was planned. A timeout was performed prior to the initiation of the procedure. The skin overlying the left lateral chest was prepped and draped in the usual sterile fashion. The overlying soft tissues were anesthetized with 1% lidocaine with epinephrine. Appropriate trajectory was planned with the use of a 22 gauge spinal needle. An 18 gauge trocar needle was advanced into the abscess/fluid collection and a short Amplatz super stiff wire was coiled within the left pleural space. Appropriate positioning was confirmed with a limited CT scan. The tract was serially dilated allowing placement of a 12 [place] all-purpose drainage catheter. Appropriate positioning was confirmed with a limited postprocedural CT scan. Approximately 450 cc of serous  fluid was aspirated. A small sample of aspirated fluid was capped and sent to the laboratory for culture as well as amylase and lipase levels. The tube was connected to a thorax device and sutured in place. A dressing was placed. -------------------------------------------- Attention was now paid towards the malpositioned existing percutaneous drainage catheter within the anterior aspect of the left mid hemi abdomen. Preprocedural CT scanning demonstrated persistent malpositioning of the percutaneous drainage catheter with end coiled and locked within the lateral most aspect of the residual complex fluid collection (series 9). The external portion of the existing percutaneous drainage catheter as well as the surrounding skin were prepped and draped in usual sterile fashion. The external portion of the percutaneous catheter was cut and cavity with an Amplatz wire which was advanced into the more central aspect of the residual complex fluid collection. Appropriate positioning was confirmed with limited CT imaging, the track was dilated and ultimately allowing placement of a new 12 French percutaneous drainage catheter. Completion imaging demonstrated improved positioning of the percutaneous drainage catheter with end coiled and locked within the caudal most aspect of the residual complex fluid collection (series 14). Approximately 75 cc of frankly purulent material was aspirated from this residual collection the percutaneous drainage catheter was secured at the skin exit site within interrupted suture. The percutaneous drainage catheter was connected to a JP bulb. A dressing was placed. The patient tolerated both above procedures well without immediate postprocedural complication. IMPRESSION: 1. Successful CT guided  placement of a 43 [place] all purpose drain catheter into the left pleural space with aspiration of approximately 450 cc of serous fluid. A small sample of aspirated fluid was sent to the laboratory for  culture as well as amylase and lipase levels. 2. Successful CT-guided exchange, repositioning and up sizing of a now 30 French percutaneous drainage catheter with and coiled and locked within the caudal aspect of the residual complex fluid collection with the left mid abdomen. This percutaneous drainage catheter is now connected to a JP bulb. Above findings discussed with Dr. Harlow Asa at the time of procedure completion. Electronically Signed   By: Sandi Mariscal M.D.   On: 03/31/2015 16:42   Ct Image Guided Drainage Percut Cath  Peritoneal Retroperit  03/31/2015  INDICATION: History of distal pancreatectomy and splenectomy complicated by a postoperative fluid collections worrisome for pseudocyst. Patient underwent CT-guided percutaneous drainage catheter placement x2 within in the pancreatic resection bed and anterior aspect of the mid abdomen on 03/20/2015. Repeat CT of the abdomen pelvis performed 03/29/2015 demonstrates development of a moderate partially loculated left-sided pleural effusion which was not amenable to ultrasound-guided thoracentesis. As such, request made for placement of a CT-guided chest tube. Additionally, the previously placed drainage catheter within the anterior aspect of the mid abdomen appears to be retracted from its original positioning and as such, attempt solely made to both exchange, reposition and potentially up size this catheter. EXAM: 1. CT-GUIDED LEFT-SIDED PLEURAL DRAINAGE CATHETER PLACEMENT 2. CT-GUIDED LEFT LATERAL ANTERIOR ABDOMINAL PERCUTANEOUS DRAINAGE CATHETER EXCHANGE, MANIPULATION AND UP SIZING. COMPARISON:  CT abdomen pelvis- 03/29/2015; 03/19/2015; CT-guided percutaneous drainage catheter placement x2 - 03/20/2015 MEDICATIONS: The patient is currently admitted to the hospital and receiving intravenous antibiotics. The antibiotics were administered within an appropriate time frame prior to the initiation of the procedure. ANESTHESIA/SEDATION: Moderate (conscious)  sedation was employed during this procedure. A total of Versed 1.5 mg and Fentanyl 50 mcg was administered intravenously. Moderate Sedation Time: 60 minutes. The patient's level of consciousness and vital signs were monitored continuously by radiology nursing throughout the procedure under my direct supervision. CONTRAST:  None COMPLICATIONS: None immediate. PROCEDURE: Informed written consent was obtained from the patient after a discussion of the risks, benefits and alternatives to treatment. The patient was placed supine on the CT gantry and a pre procedural CT was performed re-demonstrating the known partially loculated fluid collection within in the left pleural space. The procedure was planned. A timeout was performed prior to the initiation of the procedure. The skin overlying the left lateral chest was prepped and draped in the usual sterile fashion. The overlying soft tissues were anesthetized with 1% lidocaine with epinephrine. Appropriate trajectory was planned with the use of a 22 gauge spinal needle. An 18 gauge trocar needle was advanced into the abscess/fluid collection and a short Amplatz super stiff wire was coiled within the left pleural space. Appropriate positioning was confirmed with a limited CT scan. The tract was serially dilated allowing placement of a 12 [place] all-purpose drainage catheter. Appropriate positioning was confirmed with a limited postprocedural CT scan. Approximately 450 cc of serous fluid was aspirated. A small sample of aspirated fluid was capped and sent to the laboratory for culture as well as amylase and lipase levels. The tube was connected to a thorax device and sutured in place. A dressing was placed. -------------------------------------------- Attention was now paid towards the malpositioned existing percutaneous drainage catheter within the anterior aspect of the left mid hemi abdomen. Preprocedural CT scanning demonstrated persistent  malpositioning of the  percutaneous drainage catheter with end coiled and locked within the lateral most aspect of the residual complex fluid collection (series 9). The external portion of the existing percutaneous drainage catheter as well as the surrounding skin were prepped and draped in usual sterile fashion. The external portion of the percutaneous catheter was cut and cavity with an Amplatz wire which was advanced into the more central aspect of the residual complex fluid collection. Appropriate positioning was confirmed with limited CT imaging, the track was dilated and ultimately allowing placement of a new 12 French percutaneous drainage catheter. Completion imaging demonstrated improved positioning of the percutaneous drainage catheter with end coiled and locked within the caudal most aspect of the residual complex fluid collection (series 14). Approximately 75 cc of frankly purulent material was aspirated from this residual collection the percutaneous drainage catheter was secured at the skin exit site within interrupted suture. The percutaneous drainage catheter was connected to a JP bulb. A dressing was placed. The patient tolerated both above procedures well without immediate postprocedural complication. IMPRESSION: 1. Successful CT guided placement of a 12 French all purpose drain catheter into the left pleural space with aspiration of approximately 450 cc of serous fluid. A small sample of aspirated fluid was sent to the laboratory for culture as well as amylase and lipase levels. 2. Successful CT-guided exchange, repositioning and up sizing of a now 64 French percutaneous drainage catheter with and coiled and locked within the caudal aspect of the residual complex fluid collection with the left mid abdomen. This percutaneous drainage catheter is now connected to a JP bulb. Above findings discussed with Dr. Harlow Asa at the time of procedure completion. Electronically Signed   By: Sandi Mariscal M.D.   On: 03/31/2015 16:42     Assessment & Plans: Status post distal pancreatectomy, splenectomy for neuroendocrine tumor of pancreas IV vancomycin and Invanz - plan to discontinue on 2/11 (Saturday) for total of 14 days Thoracentesis with serous fluid - doubt infection Drains repositioned by IR - continued drainage Overall making progress. Plan to stop abx on Saturday. Hopefully thoracostomy drain out on Saturday. Tentatively planning discharge to [facility] for SNF/rehab on Monday 2/13. Will arrange follow up at [facility] office and in the IR drain clinic.  My partners will round over the weekend.  I will see on Monday morning.  [name], MD, [facility] Office: [place] 04/02/2015

## 2015-04-02 NOTE — Care Management Important Message (Signed)
Important Message  Patient Details IM Letter given to Suzanne/CasImportant Message  Patient Details  Name: Derek GERRY Sr. MRN: [id number] Date of Birth: 1945/09/02   Medicare Important Message Given:  Yes    [name] 04/02/2015, 11:39 AM Name: [name] MRN: [id number] Date of Birth: 1945/08/08   Medicare Important Message Given:  Yes    [name] 04/02/2015, 11:38 AMImportant Message  Patient Details  Name: [name] MRN: [id number] Date of Birth: Jun 29, 1945   Medicare Important Message Given:  Yes    [name] 04/02/2015, 11:38 AM

## 2015-04-02 NOTE — Progress Notes (Signed)
Patient ID: Derek Mohr Sr., male   DOB: 1945/08/14, 70 y.o.   MRN: [id number]    Referring Physician(s): Gerkin  Chief Complaint: Intra-abdominal fluid collections, left pleural effusion  Subjective: Patient feeling better a bit today Sitting up in bed eating some pudding. Mild pains at drain sites  Allergies: Morphine and related; Oxycontin; Quinine; and Voltaren  Medications:  Current facility-administered medications:  .  0.45 % NaCl with KCl 20 mEq / L infusion, , Intravenous, Continuous, Armandina Gemma, MD, Last Rate: 50 mL/hr at 04/01/15 1330 .  acetaminophen (TYLENOL) tablet 650 mg, 650 mg, Oral, Q4H PRN, Armandina Gemma, MD .  aspirin tablet 325 mg, 325 mg, Oral, Daily, Armandina Gemma, MD, 325 mg at 04/02/15 1020 .  ertapenem (INVANZ) 1 g in sodium chloride 0.9 % 50 mL IVPB, 1 g, Intravenous, Q24H, Armandina Gemma, MD, 1 g at 04/01/15 1600 .  feeding supplement (GLUCERNA SHAKE) (GLUCERNA SHAKE) liquid 237 mL, 237 mL, Oral, TID BM, Eugenie Filler, MD, 237 mL at 04/02/15 1000 .  heparin injection 5,000 Units, 5,000 Units, Subcutaneous, 3 times per day, Armandina Gemma, MD, 5,000 Units at 04/02/15 [phone] .  HYDROcodone-acetaminophen (NORCO/VICODIN) 5-325 MG per tablet 1-2 tablet, 1-2 tablet, Oral, Q4H PRN, Armandina Gemma, MD, 2 tablet at 04/01/15 1931 .  HYDROmorphone (DILAUDID) tablet 1 mg, 1 mg, Oral, Q3H PRN, Armandina Gemma, MD, 1 mg at 04/01/15 0909 .  insulin aspart (novoLOG) injection 0-15 Units, 0-15 Units, Subcutaneous, 6 times per day, Armandina Gemma, MD, 2 Units at 04/02/15 1204 .  magic mouthwash w/lidocaine, 15 mL, Oral, QID, Armandina Gemma, MD, 15 mL at 04/02/15 1020 .  ondansetron (ZOFRAN-ODT) disintegrating tablet 4 mg, 4 mg, Oral, Q6H PRN **OR** ondansetron (ZOFRAN) injection 4 mg, 4 mg, Intravenous, Q6H PRN, Armandina Gemma, MD, 4 mg at 03/31/15 1205 .  pantoprazole (PROTONIX) EC tablet 40 mg, 40 mg, Oral, QHS, Polly Cobia, RPH, 40 mg at 04/01/15 2143 .  traMADol (ULTRAM) tablet 50 mg, 50  mg, Oral, Q6H, Stark Klein, MD, 50 mg at 04/02/15 1203 .  vancomycin (VANCOCIN) IVPB 1000 mg/200 mL premix, 1,000 mg, Intravenous, Q12H, Randa Spike, RPH, 1,000 mg at 04/02/15 0731   Vital Signs: BP 148/65 mmHg  Pulse 92  Temp(Src) 97.6 F (36.4 C) (Oral)  Resp 20  Ht 6\' 2"  (1.88 m)  Wt 238 lb 12.1 oz (108.3 kg)  BMI 30.64 kg/m2  SpO2 95%  Physical Exam: Chest: left Chest drain intact with serous drainage, 60cc/24hrs.  No air leak  Abd: soft, minimally tender, drains all stable/intact Still murky purulent output   Labs:  CBC:  Recent Labs  03/23/15 0340 03/24/15 0330 03/26/15 0508 03/30/15 0529  WBC 15.5* 15.0* 13.2* 11.5*  HGB 9.7* 10.1* 9.4* 8.4*  HCT 31.5* 33.2* 30.8* 27.0*  PLT 898* 992* 936* 950*    COAGS:  Recent Labs  02/19/15 0920 03/18/15 1859  INR 1.07 1.33  APTT 28  --     BMP:  Recent Labs  03/26/15 0508 03/27/15 0544 03/29/15 0456 04/01/15 0457  NA 132* 130* 133* 135  K 4.2 3.9 3.7 3.1*  CL 98* 98* 100* 101  CO2 26 22 23 24   GLUCOSE 168* 154* 137* 124*  BUN 14 12 11 11   CALCIUM 10.4* 9.8 9.8 9.5  CREATININE 1.12 1.07 1.04 1.03  GFRNONAA >60 >60 >60 >60  GFRAA >60 >60 >60 >60    LIVER FUNCTION TESTS:  Recent Labs  02/19/15 0920 03/18/15 1859 03/23/15 0340  BILITOT 0.6 0.7 0.9  AST 19 34 29  ALT 23 31 23   ALKPHOS 83 147* 155*  PROT 6.9 7.1 6.5  ALBUMIN 4.2 2.7* 2.2*    Assessment and Plan: S/p distal pancreatectomy and splenectomy, now with postoperative fluid collections and pseudocyst formation; s/p left abd drain placement x2 by Dr. Annamaria Boots on 1/28, #1 repositioned by Dr. Pascal Lux 2-8 -cont both drains for now.  Still with good output Left pleural effusion, s/p CT aspiration and drain placement by Dr. Pascal Lux 2-8 -cont catheter in place. -no air leak -had 60cc out yesterday, output trending down    Electronically Signed: Ascencion Dike 04/02/2015, 2:17 PM   I spent a total of 15 Minutes at the the patient's  bedside AND on the patient's hospital floor or unit, greater than 50% of which was counseling/coordinating care for intra-abdominal abscesses, left pleural effusion, s/p multiple drain placements

## 2015-04-02 NOTE — Progress Notes (Signed)
CSW continuing to follow.   CSW followed up with pt and pt wife at bedside.   CSW provided updated bed offers. Pt wife plans to review bed offers-considering between [facility] and [facility].   CSW will ask weekend CSW to follow up regarding decision in order to check with facilities about bed availability for beginning of next week.   CSW to continue to follow to provide support and assist with pt disposition needs.   Alison Murray, MSW, [facility] [facility] [phone]

## 2015-04-02 NOTE — Progress Notes (Addendum)
Physical Therapy Treatment Patient Details Name: Derek FLY Sr. MRN: [id number] DOB: [birth year] Today's Date: 04/02/2015    History of Present Illness 70yo male who is s/p pancreatectomy 2* cancer now readmitted with abscess. H/o B TKA, HTN, DM, Meniere's disease (with falls), obesity, anxiety    PT Comments    Pt reports poor sleep last night and appears more tiered. Assisted OOB to [facility] then amb in hallway.  Progressing slowly.  Pt will need ST Rehab at SNF prior to safely returning home.   Follow Up Recommendations  SNF     Equipment Recommendations       Recommendations for Other Services       Precautions / Restrictions Precautions Precautions: Fall Precaution Comments: drains L side and T chest tube      Hx of Meniere's disease Restrictions Weight Bearing Restrictions: No RLE Weight Bearing: Weight bearing as tolerated    Mobility  Bed Mobility Overal bed mobility: Needs Assistance Bed Mobility: Supine to Sit     Supine to sit: Min assist;Mod assist     General bed mobility comments: Assist for trunk and LEs. Increased time.  Once EOB pt able to sit Indep   Transfers Overall transfer level: Needs assistance Equipment used: Rolling walker (2 wheeled) Transfers: Sit to/from Stand Sit to Stand: Min assist         General transfer comment: requires increased assist with each performance.  50% VC's on proper hand placement to power up and to controll decend. assisted to [name].    Ambulation/Gait Ambulation/Gait assistance: Min assist;+2 safety/equipment Ambulation Distance (Feet): 46 Feet Assistive device: Rolling walker (2 wheeled)   Gait velocity: decreased   General Gait Details: decreased amb distance today due to coughing episode.  slow sluggish gait with limited activity tolerance.     Stairs            Wheelchair Mobility    Modified Rankin (Stroke Patients Only)       Balance                                     Cognition Arousal/Alertness: Awake/alert Behavior During Therapy: WFL for tasks assessed/performed Overall Cognitive Status: Within Functional Limits for tasks assessed                      Exercises      General Comments        Pertinent Vitals/Pain Pain Assessment: Faces Faces Pain Scale: Hurts little more Pain Location: low back and neck Pain Descriptors / Indicators: Aching;Grimacing Pain Intervention(s): Monitored during session;Repositioned    Home Living                      Prior Function            PT Goals (current goals can now be found in the care plan section) Progress towards PT goals: Progressing toward goals    Frequency  Min 3X/week    PT Plan Current plan remains appropriate    Co-evaluation             End of Session Equipment Utilized During Treatment: Gait belt Activity Tolerance: Treatment limited secondary to medical complications (Comment);Patient limited by fatigue Patient left: in chair;with call bell/phone within reach;with family/visitor present     Time: 0930-1015 PT Time Calculation (min) (ACUTE ONLY): 45 min  Charges:  $Gait Training: 8-22  mins $Therapeutic Activity: 23-37 mins                    G Codes:      Rica Koyanagi  PTA [facility]  Acute  Rehab Pager      [phone]

## 2015-04-03 LAB — GLUCOSE, CAPILLARY
Glucose-Capillary: 137 mg/dL — ABNORMAL HIGH (ref 65–99)
Glucose-Capillary: 143 mg/dL — ABNORMAL HIGH (ref 65–99)
Glucose-Capillary: 138 mg/dL — ABNORMAL HIGH (ref 65–99)
Glucose-Capillary: 155 mg/dL — ABNORMAL HIGH (ref 65–99)

## 2015-04-03 NOTE — Progress Notes (Signed)
Subjective: PT COMFORTABLE IN BED   Objective: Vital signs in last 24 hours: Temp:  [98 F (36.7 C)-99 F (37.2 C)] 98 F (36.7 C) (02/11 0406) Pulse Rate:  [87-98] 87 (02/11 0406) Resp:  [20] 20 (02/11 0406) BP: (150-156)/(71-73) 156/71 mmHg (02/11 0406) SpO2:  [93 %-94 %] 94 % (02/11 0406) Last BM Date: 04/01/15  Intake/Output from previous day: 02/10 0701 - 02/11 0700 In: 1465 [I.V.:1250; IV Piggyback:200] Out: 1010 [Urine:825; Drains:185] Intake/Output this shift: Total I/O In: 240 [P.O.:240] Out: 675 [Urine:625; Drains:50]  Chest wall: percutaneous thoracostomy tube with serous drainage  output minimal Incision/Wound:healed   Perc drains with yellow gunk soft NT ND   Lab Results:  No results for input(s): WBC, HGB, HCT, PLT in the last 72 hours. BMET  Recent Labs  04/01/15 0457  NA 135  K 3.1*  CL 101  CO2 24  GLUCOSE 124*  BUN 11  CREATININE 1.03  CALCIUM 9.5   PT/INR No results for input(s): LABPROT, INR in the last 72 hours. ABG No results for input(s): PHART, HCO3 in the last 72 hours.  Invalid input(s): PCO2, PO2  Studies/Results: No results found.  Anti-infectives: Anti-infectives    Start     Dose/Rate Route Frequency Ordered Stop   04/03/15 0800  vancomycin (VANCOCIN) IVPB 1000 mg/200 mL premix     1,000 mg 200 mL/hr over 60 Minutes Intravenous Every 12 hours 04/02/15 2327 04/04/15 0759   03/29/15 2000  vancomycin (VANCOCIN) IVPB 1000 mg/200 mL premix  Status:  Discontinued     1,000 mg 200 mL/hr over 60 Minutes Intravenous Every 12 hours 03/29/15 0805 04/02/15 2327   03/27/15 1800  vancomycin (VANCOCIN) 1,250 mg in sodium chloride 0.9 % 250 mL IVPB  Status:  Discontinued     1,250 mg 166.7 mL/hr over 90 Minutes Intravenous Every 12 hours 03/27/15 1013 03/29/15 0805   03/22/15 2300  vancomycin (VANCOCIN) IVPB 1000 mg/200 mL premix  Status:  Discontinued     1,000 mg 200 mL/hr over 60 Minutes Intravenous Every 12 hours 03/22/15 2248  03/27/15 1013   03/21/15 0800  vancomycin (VANCOCIN) IVPB 750 mg/150 ml premix  Status:  Discontinued     750 mg 150 mL/hr over 60 Minutes Intravenous Every 12 hours 03/20/15 1840 03/22/15 2248   03/21/15 0800  fluconazole (DIFLUCAN) IVPB 200 mg  Status:  Discontinued     200 mg 100 mL/hr over 60 Minutes Intravenous Every 24 hours 03/21/15 0755 03/24/15 0716   03/20/15 1845  vancomycin (VANCOCIN) 2,500 mg in sodium chloride 0.9 % 500 mL IVPB     2,500 mg 250 mL/hr over 120 Minutes Intravenous STAT 03/20/15 1840 03/20/15 2209   03/20/15 1600  ertapenem (INVANZ) 1 g in sodium chloride 0.9 % 50 mL IVPB     1 g 100 mL/hr over 30 Minutes Intravenous Every 24 hours 03/19/15 1604 04/04/15 1559   03/19/15 1615  ertapenem (INVANZ) 1 g in sodium chloride 0.9 % 50 mL IVPB     1 g 100 mL/hr over 30 Minutes Intravenous NOW 03/19/15 1602 03/19/15 1656      Assessment/Plan: Patient Active Problem List   Diagnosis Date Noted  . Pleural effusion, left   . Debility   . Benign essential HTN   . Leukocytosis   . Thrombocytosis ([place])   . Intra-abdominal abscess ([place])   . Abdominal pain 03/18/2015  . Poorly controlled type 2 diabetes mellitus ([place])   . Acute respiratory acidosis   . 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 OA (osteoarthritis) of knee 06/02/2013  . Anal fissure 02/04/2013  . Meniere's disease   . GERD (gastroesophageal reflux disease)   . Kidney stone   . Hypertension   . Obstructive sleep apnea   . Morbid obesity ([place])   . Synovitis of knee 03/01/2012  . Chronic cough 11/03/2011  . COSTOCHONDRITIS, LEFT 10/08/2009  . RIB PAIN, LEFT SIDED 10/08/2009   STABLE   CONT THERAPIES   PLACE NEXT WEEK HOPEFULLY   LOS: 15 days    Darril Patriarca A. 04/03/2015

## 2015-04-03 NOTE — Clinical Social Work Note (Signed)
CSW received message that pt has chosen [facility] for his rehab needs. CSW sent message through the hub to [facility]  .Dede Query, LCSW [facility] Clinical Social Worker - Weekend Coverage cell #: [phone]

## 2015-04-03 NOTE — Progress Notes (Signed)
Referring Physician(s): Gerkin  Chief Complaint: Intra-abdominal fluid collections, left pleural effusion  Subjective:  Patient with no new complaints today  Allergies: Morphine and related; Oxycontin; Quinine; and Voltaren  Medications: Prior to Admission medications   Medication Sig Start Date End Date Taking? Authorizing Provider  carvedilol (COREG) 25 MG tablet Take 25 mg by mouth 2 (two) times daily with a meal.  09/26/11  Yes Historical Provider, MD  [employer] TRIPLE MAGNESIUM COMPLEX PO Take 1-2 capsules by mouth 2 (two) times daily. Two capsules in the morning and one at night   Yes Historical Provider, MD  diphenhydrAMINE (BENADRYL) 25 mg capsule Take 50 mg by mouth 2 (two) times daily. Reported on 03/17/2015   Yes Historical Provider, MD  HYDROmorphone (DILAUDID) 2 MG tablet Take 1 tablet (2 mg total) by mouth every 3 (three) hours as needed for moderate pain or severe pain. 03/03/15  Yes Armandina Gemma, MD  insulin aspart (NOVOLOG) 100 UNIT/ML injection Inject 0-9 Units into the skin 3 (three) times daily before meals. 03/03/15  Yes Armandina Gemma, MD  insulin glargine (LANTUS) 100 UNIT/ML injection Inject 0.2 mLs (20 Units total) into the skin daily. 03/03/15  Yes Armandina Gemma, MD  losartan (COZAAR) 50 MG tablet Take 50 mg by mouth daily. 11/06/14  Yes Historical Provider, MD  naproxen sodium (ALEVE) 220 MG tablet Take 440 mg by mouth 2 (two) times daily with a meal.    Yes Historical Provider, MD  aspirin EC 81 MG tablet Take 81 mg by mouth at bedtime.    Historical Provider, MD  clobetasol ointment (TEMOVATE) [id number] % Apply 1 application topically daily as needed (for peeling skin on hands and feet). Reported on 03/17/2015    Historical Provider, MD  Insulin Syringes, Disposable, U-100 0.5 ML MISC 20 Units by Does not apply route daily. 03/03/15   Armandina Gemma, MD  lansoprazole (PREVACID) 15 MG capsule Take 30 mg by mouth daily as needed (reflux). Reported on 03/17/2015    Historical Provider,  MD  [facility] DELICA LANCETS [id number] MISC 1 Device by Does not apply route 4 (four) times daily. 03/17/15   Renato Shin, MD     Vital Signs: BP 156/71 mmHg  Pulse 87  Temp(Src) 98 F (36.7 C) (Oral)  Resp 20  Ht 6\' 2"  (1.88 m)  Wt 238 lb 12.1 oz (108.3 kg)  BMI 30.64 kg/m2  SpO2 94%  Physical Exam  Chest: left Chest drain intact with serous drainage, looks like about 100 mls output according to marks on the pleuravac. No air leak  Abd: soft, minimally tender, drains all stable/intact. Sites look good. Still purulent output with sediment.   Charted output is combined so difficult to tell exact output from each drain. Total charted is 185 mls.  Imaging: Dg Chest 1 View  03/30/2015  CLINICAL DATA:  Attempted LEFT thoracentesis EXAM: CHEST 1 VIEW COMPARISON:  03/20/2015 FINDINGS: Rotation to the RIGHT. Enlargement of cardiac silhouette. Pulmonary vascular congestion. Large peripheral opacity at the lateral aspect of the LEFT hemi thorax extending from above the aortic arch to the diaphragm likely representing a loculated pleural fluid collection. This is significantly increased since 03/20/2015. Significant compressive atelectasis of the mid to lower LEFT lung. RIGHT lung grossly clear. No pneumothorax. Bones demineralized. IMPRESSION: Significant interval increase in size of a large loculated LEFT pleural collection since 03/20/2015. Electronically Signed   By: Lavonia Dana M.D.   On: 03/30/2015 17:11   Dg Chest Port 1 View  04/01/2015  CLINICAL DATA:  Chest tube placement. EXAM: PORTABLE CHEST 1 VIEW COMPARISON:  CT 03/31/2015 .  03/30/2015. FINDINGS: Left chest tube in good anatomic position with significant reduction in large loculated left pleural effusion. No pneumothorax. Pulmonary apices not completely imaged. Stable cardiomegaly. Low lung volumes with basilar atelectasis. Surgical clips upper abdomen. Drainage catheter left upper abdomen. IMPRESSION: 1. Interim placement of left chest  tube with significant reduction in large loculated left pleural effusion. 2.  Stable cardiomegaly.  Low lung volumes with basilar atelectasis. 3.  Left upper quadrant drainage catheter stable position. Electronically Signed   By: [name]  Register   On: 04/01/2015 07:05   Ct Image Guided Drainage By Percutaneous Catheter  03/31/2015  INDICATION: History of distal pancreatectomy and splenectomy complicated by a postoperative fluid collections worrisome for pseudocyst. Patient underwent CT-guided percutaneous drainage catheter placement x2 within in the pancreatic resection bed and anterior aspect of the mid abdomen on 03/20/2015. Repeat CT of the abdomen pelvis performed 03/29/2015 demonstrates development of a moderate partially loculated left-sided pleural effusion which was not amenable to ultrasound-guided thoracentesis. As such, request made for placement of a CT-guided chest tube. Additionally, the previously placed drainage catheter within the anterior aspect of the mid abdomen appears to be retracted from its original positioning and as such, attempt solely made to both exchange, reposition and potentially up size this catheter. EXAM: 1. CT-GUIDED LEFT-SIDED PLEURAL DRAINAGE CATHETER PLACEMENT 2. CT-GUIDED LEFT LATERAL ANTERIOR ABDOMINAL PERCUTANEOUS DRAINAGE CATHETER EXCHANGE, MANIPULATION AND UP SIZING. COMPARISON:  CT abdomen pelvis- 03/29/2015; 03/19/2015; CT-guided percutaneous drainage catheter placement x2 - 03/20/2015 MEDICATIONS: The patient is currently admitted to the hospital and receiving intravenous antibiotics. The antibiotics were administered within an appropriate time frame prior to the initiation of the procedure. ANESTHESIA/SEDATION: Moderate (conscious) sedation was employed during this procedure. A total of Versed 1.5 mg and Fentanyl 50 mcg was administered intravenously. Moderate Sedation Time: 60 minutes. The patient's level of consciousness and vital signs were monitored continuously  by radiology nursing throughout the procedure under my direct supervision. CONTRAST:  None COMPLICATIONS: None immediate. PROCEDURE: Informed written consent was obtained from the patient after a discussion of the risks, benefits and alternatives to treatment. The patient was placed supine on the CT gantry and a pre procedural CT was performed re-demonstrating the known partially loculated fluid collection within in the left pleural space. The procedure was planned. A timeout was performed prior to the initiation of the procedure. The skin overlying the left lateral chest was prepped and draped in the usual sterile fashion. The overlying soft tissues were anesthetized with 1% lidocaine with epinephrine. Appropriate trajectory was planned with the use of a 22 gauge spinal needle. An 18 gauge trocar needle was advanced into the abscess/fluid collection and a short Amplatz super stiff wire was coiled within the left pleural space. Appropriate positioning was confirmed with a limited CT scan. The tract was serially dilated allowing placement of a 12 [place] all-purpose drainage catheter. Appropriate positioning was confirmed with a limited postprocedural CT scan. Approximately 450 cc of serous fluid was aspirated. A small sample of aspirated fluid was capped and sent to the laboratory for culture as well as amylase and lipase levels. The tube was connected to a thorax device and sutured in place. A dressing was placed. -------------------------------------------- Attention was now paid towards the malpositioned existing percutaneous drainage catheter within the anterior aspect of the left mid hemi abdomen. Preprocedural CT scanning demonstrated persistent malpositioning of the percutaneous drainage catheter with end coiled  and locked within the lateral most aspect of the residual complex fluid collection (series 9). The external portion of the existing percutaneous drainage catheter as well as the surrounding skin were  prepped and draped in usual sterile fashion. The external portion of the percutaneous catheter was cut and cavity with an Amplatz wire which was advanced into the more central aspect of the residual complex fluid collection. Appropriate positioning was confirmed with limited CT imaging, the track was dilated and ultimately allowing placement of a new 12 French percutaneous drainage catheter. Completion imaging demonstrated improved positioning of the percutaneous drainage catheter with end coiled and locked within the caudal most aspect of the residual complex fluid collection (series 14). Approximately 75 cc of frankly purulent material was aspirated from this residual collection the percutaneous drainage catheter was secured at the skin exit site within interrupted suture. The percutaneous drainage catheter was connected to a JP bulb. A dressing was placed. The patient tolerated both above procedures well without immediate postprocedural complication. IMPRESSION: 1. Successful CT guided placement of a 12 French all purpose drain catheter into the left pleural space with aspiration of approximately 450 cc of serous fluid. A small sample of aspirated fluid was sent to the laboratory for culture as well as amylase and lipase levels. 2. Successful CT-guided exchange, repositioning and up sizing of a now 27 French percutaneous drainage catheter with and coiled and locked within the caudal aspect of the residual complex fluid collection with the left mid abdomen. This percutaneous drainage catheter is now connected to a JP bulb. Above findings discussed with Dr. Harlow Asa at the time of procedure completion. Electronically Signed   By: Sandi Mariscal M.D.   On: 03/31/2015 16:42   Ct Image Guided Drainage Percut Cath  Peritoneal Retroperit  03/31/2015  INDICATION: History of distal pancreatectomy and splenectomy complicated by a postoperative fluid collections worrisome for pseudocyst. Patient underwent CT-guided  percutaneous drainage catheter placement x2 within in the pancreatic resection bed and anterior aspect of the mid abdomen on 03/20/2015. Repeat CT of the abdomen pelvis performed 03/29/2015 demonstrates development of a moderate partially loculated left-sided pleural effusion which was not amenable to ultrasound-guided thoracentesis. As such, request made for placement of a CT-guided chest tube. Additionally, the previously placed drainage catheter within the anterior aspect of the mid abdomen appears to be retracted from its original positioning and as such, attempt solely made to both exchange, reposition and potentially up size this catheter. EXAM: 1. CT-GUIDED LEFT-SIDED PLEURAL DRAINAGE CATHETER PLACEMENT 2. CT-GUIDED LEFT LATERAL ANTERIOR ABDOMINAL PERCUTANEOUS DRAINAGE CATHETER EXCHANGE, MANIPULATION AND UP SIZING. COMPARISON:  CT abdomen pelvis- 03/29/2015; 03/19/2015; CT-guided percutaneous drainage catheter placement x2 - 03/20/2015 MEDICATIONS: The patient is currently admitted to the hospital and receiving intravenous antibiotics. The antibiotics were administered within an appropriate time frame prior to the initiation of the procedure. ANESTHESIA/SEDATION: Moderate (conscious) sedation was employed during this procedure. A total of Versed 1.5 mg and Fentanyl 50 mcg was administered intravenously. Moderate Sedation Time: 60 minutes. The patient's level of consciousness and vital signs were monitored continuously by radiology nursing throughout the procedure under my direct supervision. CONTRAST:  None COMPLICATIONS: None immediate. PROCEDURE: Informed written consent was obtained from the patient after a discussion of the risks, benefits and alternatives to treatment. The patient was placed supine on the CT gantry and a pre procedural CT was performed re-demonstrating the known partially loculated fluid collection within in the left pleural space. The procedure was planned. A timeout was performed prior  to the initiation of the procedure. The skin overlying the left lateral chest was prepped and draped in the usual sterile fashion. The overlying soft tissues were anesthetized with 1% lidocaine with epinephrine. Appropriate trajectory was planned with the use of a 22 gauge spinal needle. An 18 gauge trocar needle was advanced into the abscess/fluid collection and a short Amplatz super stiff wire was coiled within the left pleural space. Appropriate positioning was confirmed with a limited CT scan. The tract was serially dilated allowing placement of a 12 [place] all-purpose drainage catheter. Appropriate positioning was confirmed with a limited postprocedural CT scan. Approximately 450 cc of serous fluid was aspirated. A small sample of aspirated fluid was capped and sent to the laboratory for culture as well as amylase and lipase levels. The tube was connected to a thorax device and sutured in place. A dressing was placed. -------------------------------------------- Attention was now paid towards the malpositioned existing percutaneous drainage catheter within the anterior aspect of the left mid hemi abdomen. Preprocedural CT scanning demonstrated persistent malpositioning of the percutaneous drainage catheter with end coiled and locked within the lateral most aspect of the residual complex fluid collection (series 9). The external portion of the existing percutaneous drainage catheter as well as the surrounding skin were prepped and draped in usual sterile fashion. The external portion of the percutaneous catheter was cut and cavity with an Amplatz wire which was advanced into the more central aspect of the residual complex fluid collection. Appropriate positioning was confirmed with limited CT imaging, the track was dilated and ultimately allowing placement of a new 12 French percutaneous drainage catheter. Completion imaging demonstrated improved positioning of the percutaneous drainage catheter with end coiled  and locked within the caudal most aspect of the residual complex fluid collection (series 14). Approximately 75 cc of frankly purulent material was aspirated from this residual collection the percutaneous drainage catheter was secured at the skin exit site within interrupted suture. The percutaneous drainage catheter was connected to a JP bulb. A dressing was placed. The patient tolerated both above procedures well without immediate postprocedural complication. IMPRESSION: 1. Successful CT guided placement of a 12 French all purpose drain catheter into the left pleural space with aspiration of approximately 450 cc of serous fluid. A small sample of aspirated fluid was sent to the laboratory for culture as well as amylase and lipase levels. 2. Successful CT-guided exchange, repositioning and up sizing of a now 71 French percutaneous drainage catheter with and coiled and locked within the caudal aspect of the residual complex fluid collection with the left mid abdomen. This percutaneous drainage catheter is now connected to a JP bulb. Above findings discussed with Dr. Harlow Asa at the time of procedure completion. Electronically Signed   By: Sandi Mariscal M.D.   On: 03/31/2015 16:42   Ir Thoracentesis Asp Pleural Space W/img Guide  03/30/2015  CLINICAL DATA:  70 year old male with a history of pancreatic pseudocyst and abdominal abscesses with presumed left sympathetic effusion/empyema. He has been referred for sampling. EXAM: LIMITED ULTRASOUND LEFT CHEST : COMPARISON: CT 03/29/2015, 03/20/2015 PROCEDURE: An ultrasound guided thoracentesis was thoroughly discussed with the patient and questions answered. The benefits, risks, alternatives and complications were also discussed. The patient understands and wishes to proceed with the procedure. Written consent was obtained. Ultrasound survey performed the left chest. Small window for a posterior approach, with ultrasound-guided thoracentesis attempted. The area was prepped  and draped in the normal sterile fashion. 1% Lidocaine was used for local anesthesia. Under ultrasound  guidance a 19 gauge Yueh catheter was introduced. No significant fluid was aspirated from small fluid collection. Second site was attempted. Patient tolerated the procedure well and remained hemodynamically stable throughout. No significant blood loss encountered.  No complications. FINDINGS: Small fluid pocket from posterior approach to the chest. No significant fluid was aspirated. IMPRESSION: Status post left ultrasound-guided lower with no significant fluid aspirated from small pocket from posterior thoracic approach. The patient will be rescheduled for a CT guided aspiration/drainage of pleural effusion from anterior lateral approach. Signed, Dulcy Fanny. Earleen Newport, DO [facility] Electronically Signed   By: Corrie Mckusick D.O.   On: 03/30/2015 16:57    Labs:  CBC:  Recent Labs  03/23/15 0340 03/24/15 0330 03/26/15 0508 03/30/15 0529  WBC 15.5* 15.0* 13.2* 11.5*  HGB 9.7* 10.1* 9.4* 8.4*  HCT 31.5* 33.2* 30.8* 27.0*  PLT 898* 992* 936* 950*    COAGS:  Recent Labs  02/19/15 0920 03/18/15 1859  INR 1.07 1.33  APTT 28  --     BMP:  Recent Labs  03/26/15 0508 03/27/15 0544 03/29/15 0456 04/01/15 0457  NA 132* 130* 133* 135  K 4.2 3.9 3.7 3.1*  CL 98* 98* 100* 101  CO2 26 22 23 24   GLUCOSE 168* 154* 137* 124*  BUN 14 12 11 11   CALCIUM 10.4* 9.8 9.8 9.5  CREATININE 1.12 1.07 1.04 1.03  GFRNONAA >60 >60 >60 >60  GFRAA >60 >60 >60 >60    LIVER FUNCTION TESTS:  Recent Labs  02/19/15 0920 03/18/15 1859 03/23/15 0340  BILITOT 0.6 0.7 0.9  AST 19 34 29  ALT 23 31 23   ALKPHOS 83 147* 155*  PROT 6.9 7.1 6.5  ALBUMIN 4.2 2.7* 2.2*    Assessment and Plan:  S/p distal pancreatectomy and splenectomy, now with postoperative fluid collections and pseudocyst formation; s/p left abd drain placement x2 by Dr. Annamaria Boots on  1/28, #1 repositioned by Dr. Pascal Lux 2/8 - Continue both drains for now. Still with good output  Left pleural effusion, s/p CT aspiration and drain placement by Dr. Pascal Lux 2/8 - Continue chest catheter for now  Electronically Signed: [facility] S BLAIR PA-C 04/03/2015, 10:10 AM   I spent a total of 15 Minutes at the the patient's bedside AND on the patient's hospital floor or unit, greater than 50% of which was counseling/coordinating care for f/u of drains/chest catheter.

## 2015-04-04 ENCOUNTER — Inpatient Hospital Stay (HOSPITAL_COMMUNITY): Payer: PPO

## 2015-04-04 DIAGNOSIS — E440 Moderate protein-calorie malnutrition: Secondary | ICD-10-CM

## 2015-04-04 DIAGNOSIS — E739 Lactose intolerance, unspecified: Secondary | ICD-10-CM

## 2015-04-04 LAB — BODY FLUID CULTURE: Culture: NO GROWTH

## 2015-04-04 LAB — GLUCOSE, CAPILLARY
Glucose-Capillary: 135 mg/dL — ABNORMAL HIGH (ref 65–99)
Glucose-Capillary: 120 mg/dL — ABNORMAL HIGH (ref 65–99)
Glucose-Capillary: 119 mg/dL — ABNORMAL HIGH (ref 65–99)
Glucose-Capillary: 171 mg/dL — ABNORMAL HIGH (ref 65–99)

## 2015-04-04 MED ORDER — NAPROXEN SODIUM 275 MG PO TABS: 440.0000 mg | ORAL_TABLET | Freq: Two times a day (BID) | ORAL | Status: DC

## 2015-04-04 MED ORDER — ACETAMINOPHEN 500 MG PO TABS
1000.0000 mg | ORAL_TABLET | Freq: Two times a day (BID) | ORAL | Status: DC
  Administered 2015-04-04 – 2015-04-06 (×4): 1000 mg via ORAL
  Filled 2015-04-04 (×4): qty 2

## 2015-04-04 MED ORDER — TRAMADOL HCL 50 MG PO TABS: 100.0000 mg | ORAL_TABLET | Freq: Four times a day (QID) | ORAL | Status: DC

## 2015-04-04 MED ORDER — ACETAMINOPHEN 500 MG PO TABS: 500.0000 mg | ORAL_TABLET | Freq: Four times a day (QID) | ORAL | Status: DC | PRN

## 2015-04-04 MED ORDER — SODIUM CHLORIDE 0.9% FLUSH
3.0000 mL | Freq: Two times a day (BID) | INTRAVENOUS | Status: DC
  Administered 2015-04-04 – 2015-04-05 (×3): 3 mL via INTRAVENOUS

## 2015-04-04 MED ORDER — LIP MEDEX EX OINT
1.0000 "application " | TOPICAL_OINTMENT | Freq: Two times a day (BID) | CUTANEOUS | Status: DC
  Administered 2015-04-04 – 2015-04-06 (×5): 1 via TOPICAL
  Filled 2015-04-04: qty 7

## 2015-04-04 MED ORDER — DIPHENHYDRAMINE HCL 50 MG/ML IJ SOLN: 12.5000 mg | Freq: Four times a day (QID) | INTRAMUSCULAR | Status: DC | PRN

## 2015-04-04 MED ORDER — SODIUM CHLORIDE 0.9 % IV SOLN: 250.0000 mL | INTRAVENOUS | Status: DC | PRN

## 2015-04-04 MED ORDER — MENTHOL 3 MG MT LOZG
1.0000 | LOZENGE | OROMUCOSAL | Status: DC | PRN
  Administered 2015-04-04: 3 mg via ORAL
  Filled 2015-04-04: qty 9

## 2015-04-04 MED ORDER — PHENOL 1.4 % MT LIQD
2.0000 | OROMUCOSAL | Status: DC | PRN
  Filled 2015-04-04: qty 177

## 2015-04-04 MED ORDER — SODIUM CHLORIDE 0.9% FLUSH: 3.0000 mL | INTRAVENOUS | Status: DC | PRN

## 2015-04-04 MED ORDER — SACCHAROMYCES BOULARDII 250 MG PO CAPS
250.0000 mg | ORAL_CAPSULE | Freq: Two times a day (BID) | ORAL | Status: DC
  Administered 2015-04-04 – 2015-04-06 (×5): 250 mg via ORAL
  Filled 2015-04-04 (×5): qty 1

## 2015-04-04 MED ORDER — NAPROXEN SODIUM 550 MG PO TABS
550.0000 mg | ORAL_TABLET | Freq: Two times a day (BID) | ORAL | Status: DC
  Administered 2015-04-04 – 2015-04-06 (×4): 550 mg via ORAL
  Filled 2015-04-04 (×6): qty 1

## 2015-04-04 MED ORDER — HYDROMORPHONE HCL 2 MG PO TABS
2.0000 mg | ORAL_TABLET | ORAL | Status: DC | PRN
  Administered 2015-04-04 – 2015-04-05 (×3): 2 mg via ORAL
  Filled 2015-04-04 (×3): qty 1

## 2015-04-04 MED ORDER — PROMETHAZINE HCL 25 MG/ML IJ SOLN: 6.2500 mg | INTRAMUSCULAR | Status: DC | PRN

## 2015-04-04 MED ORDER — METHOCARBAMOL 500 MG PO TABS
1000.0000 mg | ORAL_TABLET | Freq: Four times a day (QID) | ORAL | Status: DC
  Administered 2015-04-04 – 2015-04-06 (×8): 1000 mg via ORAL
  Filled 2015-04-04 (×8): qty 2

## 2015-04-04 MED ORDER — ASPIRIN EC 81 MG PO TBEC
81.0000 mg | DELAYED_RELEASE_TABLET | Freq: Every day | ORAL | Status: DC
  Administered 2015-04-04 – 2015-04-05 (×2): 81 mg via ORAL
  Filled 2015-04-04 (×2): qty 1

## 2015-04-04 MED ORDER — MAGIC MOUTHWASH
15.0000 mL | Freq: Four times a day (QID) | ORAL | Status: DC | PRN
  Filled 2015-04-04: qty 15

## 2015-04-04 MED ORDER — BISACODYL 10 MG RE SUPP: 10.0000 mg | Freq: Two times a day (BID) | RECTAL | Status: DC | PRN

## 2015-04-04 MED ORDER — ALUM & MAG HYDROXIDE-SIMETH 200-200-20 MG/5ML PO SUSP: 30.0000 mL | Freq: Four times a day (QID) | ORAL | Status: DC | PRN

## 2015-04-04 MED ORDER — BOOST PLUS PO LIQD
237.0000 mL | Freq: Two times a day (BID) | ORAL | Status: DC
  Administered 2015-04-04 – 2015-04-05 (×2): 237 mL via ORAL
  Filled 2015-04-04 (×5): qty 237

## 2015-04-04 MED ORDER — CARVEDILOL 25 MG PO TABS
25.0000 mg | ORAL_TABLET | Freq: Two times a day (BID) | ORAL | Status: DC
  Administered 2015-04-04 – 2015-04-05 (×2): 25 mg via ORAL
  Filled 2015-04-04 (×2): qty 1

## 2015-04-04 MED ORDER — ACETAMINOPHEN 500 MG PO TABS: 1000.0000 mg | ORAL_TABLET | Freq: Three times a day (TID) | ORAL | Status: DC

## 2015-04-04 MED ORDER — INSULIN GLARGINE 100 UNIT/ML ~~LOC~~ SOLN
20.0000 [IU] | Freq: Every day | SUBCUTANEOUS | Status: DC
  Administered 2015-04-04 – 2015-04-06 (×3): 20 [IU] via SUBCUTANEOUS
  Filled 2015-04-04 (×3): qty 0.2

## 2015-04-04 MED ORDER — PSYLLIUM 95 % PO PACK
1.0000 | PACK | Freq: Every day | ORAL | Status: DC
  Administered 2015-04-05 – 2015-04-06 (×2): 1 via ORAL
  Filled 2015-04-04 (×3): qty 1

## 2015-04-04 NOTE — Progress Notes (Signed)
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  POSTOPERATIVE DIAGNOSIS: Neuroendocrine tumor of the pancreas.  PROCEDURE: Distal pancreatectomy and splenectomy.  SURGEON: [name], M.D.  ASSISTANT: [name], M.D.  ANESTHESIA: General.  ESTIMATED BLOOD LOSS: 200 mL.  PREPARATION: ChloraPrep.  COMPLICATIONS: None.  INDICATIONS: The patient is a 70 year old male referred by Dr. Oretha Caprice for neuroendocrine tumor of the pancreas. The patient had presented to his primary care physician with lower abdominal pain in August 2016. The patient underwent abdominal ultrasound, which showed a hypoechoic lesion in the body of the pancreas. CT scan of the abdomen and pelvis was performed and confirmed a 2-cm mass in the midbody of the pancreas. Further evaluation included upper endoscopy with endoscopic ultrasound and biopsy. The fine-needle aspiration showed a neuroendocrine neoplasm. The patient now comes to Surgery for resection for definitive  diagnosis and management.      Assessment & Plans: Status post distal pancreatectomy, splenectomy for neuroendocrine tumor of pancreas  Thoracentesis with serous fluid - doubt infection. Stable off Abx.  Drains repositioned by IR - continued drainage.  Hopefully thoracostomy drain out soon.  Tentatively planning discharge to [facility] for SNF/rehab on Monday 2/13. Will arrange follow up at [place] office and in the IR drain clinic. Dr Harlow Asa to see on Monday morning.  -glc control SSI & lantus -pain control - inc Dilaudid.  sch naproxen/tylenol.  GET HIM UP!!!  Adin Hector, M.D., F.A.C.S. Gastrointestinal and Minimally Invasive Surgery [facility] [address] [place], [place] [address] [phone] Main / Paging   04/04/2015  Subjective:  Tired Friend at bedside Not getting up Ref supp shake  Objective:  Vital signs:  Filed Vitals:   04/03/15 0406 04/03/15 1449 04/03/15 2109 04/04/15 0627  BP: 156/71 158/74 148/72 146/70  Pulse: 87 83 91 95  Temp: 98 F (36.7 C) 98.1 F (36.7 C) 98.6 F (37 C) 98.7 F (37.1 C)  TempSrc: Oral Oral Oral Oral  Resp: '20 20 20 20  '$ Height:      Weight:      SpO2: 94% 93% 94% 90%    Last BM Date: 04/01/15  Intake/Output   Yesterday:  02/11 0701 - 02/12 0700 In: [id number] [P.O.:720; I.V.:1200; IV Piggyback:300] Out: 1915 [Urine:1800; Drains:115] This shift:     Bowel function:  Flatus: y  BM: y  Drain: chest thin serous abd milky  Physical Exam:  General: Pt awake/alert/oriented x4 in nocute distress Eyes: PERRL, normal EOM.  Sclera clear.  No icterus Neuro: CN II-XII intact w/o focal sensory/motor deficits. Lymph: No head/neck/groin lymphadenopathy Psych:  No delerium/psychosis/paranoia HENT: Normocephalic, Mucus membranes moist.  No  thrush Neck: Supple, No tracheal deviation Chest: No chest wall pain w good  excursion CV:  Pulses intact.  Regular rhythm MS: Normal AROM mjr joints.  No obvious deformity Abdomen: Soft.  Nondistended.  Nontender.  Incision closed.  No peritonitis.  No incarcerated hernias. Ext:  SCDs BLE.  No mjr edema.  No cyanosis Skin: No petechiae / purpura  Results:   Labs: Results for orders placed or performed during the hospital encounter of 03/18/15 (from the past 48 hour(s))  Glucose, capillary     Status: Abnormal   Collection Time: 04/02/15 11:58 AM  Result Value Ref Range   Glucose-Capillary 137 (H) 65 - 99 mg/dL  Glucose, capillary     Status: Abnormal   Collection Time: 04/02/15  4:37 PM  Result Value Ref Range   Glucose-Capillary 127 (H) 65 - 99 mg/dL  Glucose, capillary     Status: Abnormal   Collection Time: 04/02/15  9:19 PM  Result Value Ref Range   Glucose-Capillary 136 (H) 65 - 99 mg/dL   Comment 1 Notify RN   Glucose, capillary     Status: Abnormal   Collection Time: 04/03/15  7:43 AM  Result Value Ref Range   Glucose-Capillary 137 (H) 65 - 99 mg/dL  Glucose, capillary     Status: Abnormal   Collection Time: 04/03/15 11:36 AM  Result Value Ref Range   Glucose-Capillary 143 (H) 65 - 99 mg/dL  Glucose, capillary     Status: Abnormal   Collection Time: 04/03/15  4:40 PM  Result Value Ref Range   Glucose-Capillary 138 (H) 65 - 99 mg/dL  Glucose, capillary     Status: Abnormal   Collection Time: 04/03/15  9:07 PM  Result Value Ref Range   Glucose-Capillary 155 (H) 65 - 99 mg/dL   Comment 1 Notify RN   Glucose, capillary     Status: Abnormal   Collection Time: 04/04/15  7:23 AM  Result Value Ref Range   Glucose-Capillary 135 (H) 65 - 99 mg/dL    Imaging / Studies: Dg Chest Port 1 View  04/04/2015  CLINICAL DATA:  Evaluate left-sided pleural effusion status post chest tube placement. Initial encounter. EXAM: PORTABLE CHEST 1 VIEW COMPARISON:  Chest radiograph performed 04/01/2015 FINDINGS: The left-sided pleural effusion has decreased further  in size, though there is residual left lateral pleural thickening, reflecting residual fluid. Underlying vascular congestion is again noted. Increased interstitial markings raise concern for mild interstitial edema. No pneumothorax is seen. The cardiomediastinal silhouette is borderline normal in size. No acute osseous abnormalities are identified. The patient is status post right-sided rotator cuff repair. IMPRESSION: Left-sided pleural effusion has decreased further in size, though there is residual left lateral pleural thickening, reflecting residual fluid. Underlying vascular congestion again noted. Increased interstitial markings raise concern for mild interstitial edema. Electronically Signed   By: Garald Balding M.D.   On: 04/04/2015 07:19    Medications / Allergies: per chart  Antibiotics: Anti-infectives    Start     Dose/Rate Route Frequency Ordered Stop   04/03/15 0800  vancomycin (VANCOCIN) IVPB 1000 mg/200 mL premix     1,000 mg 200 mL/hr over 60 Minutes Intravenous Every 12 hours 04/02/15 2327 04/03/15 2118   03/29/15 2000  vancomycin (VANCOCIN) IVPB 1000 mg/200 mL premix  Status:  Discontinued     1,000 mg 200 mL/hr over 60 Minutes Intravenous Every 12 hours 03/29/15 0805 04/02/15 2327   03/27/15 1800  vancomycin (VANCOCIN) 1,250 mg in sodium chloride 0.9 %  250 mL IVPB  Status:  Discontinued     1,250 mg 166.7 mL/hr over 90 Minutes Intravenous Every 12 hours 03/27/15 1013 03/29/15 0805   03/22/15 2300  vancomycin (VANCOCIN) IVPB 1000 mg/200 mL premix  Status:  Discontinued     1,000 mg 200 mL/hr over 60 Minutes Intravenous Every 12 hours 03/22/15 2248 03/27/15 1013   03/21/15 0800  vancomycin (VANCOCIN) IVPB 750 mg/150 ml premix  Status:  Discontinued     750 mg 150 mL/hr over 60 Minutes Intravenous Every 12 hours 03/20/15 1840 03/22/15 2248   03/21/15 0800  fluconazole (DIFLUCAN) IVPB 200 mg  Status:  Discontinued     200 mg 100 mL/hr over 60 Minutes Intravenous Every 24  hours 03/21/15 0755 03/24/15 0716   03/20/15 1845  vancomycin (VANCOCIN) 2,500 mg in sodium chloride 0.9 % 500 mL IVPB     2,500 mg 250 mL/hr over 120 Minutes Intravenous STAT 03/20/15 1840 03/20/15 2209   03/20/15 1600  ertapenem (INVANZ) 1 g in sodium chloride 0.9 % 50 mL IVPB     1 g 100 mL/hr over 30 Minutes Intravenous Every 24 hours 03/19/15 1604 04/03/15 1654   03/19/15 1615  ertapenem (INVANZ) 1 g in sodium chloride 0.9 % 50 mL IVPB     1 g 100 mL/hr over 30 Minutes Intravenous NOW 03/19/15 1602 03/19/15 1656        Note: Portions of this report may have been transcribed using voice recognition software. Every effort was made to ensure accuracy; however, inadvertent computerized transcription errors may be present.   Any transcriptional errors that result from this process are unintentional.     Adin Hector, M.D., F.A.C.S. Gastrointestinal and Minimally Invasive Surgery [facility] [address] [place], [place] [address] [phone] Main / Paging   04/04/2015  CARE TEAM:  PCP:  Melinda Crutch, MD  Outpatient Care Team: Patient Care Team: Lona Kettle, MD as PCP - General (Family Medicine) Tobi Bastos, RN as [place], LCSW as [employer] [facility]  Inpatient Treatment Team: Treatment Team: Attending Provider: Armandina Gemma, MD; Registered Nurse: Oleta Mouse, RN; Registered Nurse: Edmonia Caprio, RN; Registered Nurse: Eugenie Birks, RN; Technician: Carlisle Beers, NT; Technician: Shona Needles, NT; Registered Nurse: Gentry Roch, RN; Technician: Emmaline Kluver, NT; Registered Nurse: Eli Hose, RN; Technician: Mertha Baars, NT; Registered Nurse: Derenda Fennel, RN; Social Worker: Rigoberto Noel, LCSW

## 2015-04-04 NOTE — Progress Notes (Signed)
Assumed care of pt 1515.  I agree with prior nurses assessment.  Pt in stable condition, resting and appears comfortable.  Will continue with plan of care.

## 2015-04-05 ENCOUNTER — Inpatient Hospital Stay (HOSPITAL_COMMUNITY): Payer: PPO

## 2015-04-05 ENCOUNTER — Ambulatory Visit: Payer: PPO | Admitting: Endocrinology

## 2015-04-05 DIAGNOSIS — J90 Pleural effusion, not elsewhere classified: Secondary | ICD-10-CM | POA: Insufficient documentation

## 2015-04-05 LAB — CBC
HCT: 25.3 % — ABNORMAL LOW (ref 39.0–52.0)
Hemoglobin: 7.8 g/dL — ABNORMAL LOW (ref 13.0–17.0)
MCH: 23.4 pg — ABNORMAL LOW (ref 26.0–34.0)
MCHC: 30.8 g/dL (ref 30.0–36.0)
MCV: 76 fL — ABNORMAL LOW (ref 78.0–100.0)
Platelets: 812 10*3/uL — ABNORMAL HIGH (ref 150–400)
RBC: 3.33 MIL/uL — ABNORMAL LOW (ref 4.22–5.81)
RDW: 16.5 % — ABNORMAL HIGH (ref 11.5–15.5)
WBC: 7.9 10*3/uL (ref 4.0–10.5)

## 2015-04-05 LAB — COMPREHENSIVE METABOLIC PANEL
ALT: 12 U/L — ABNORMAL LOW (ref 17–63)
AST: 19 U/L (ref 15–41)
Albumin: 2 g/dL — ABNORMAL LOW (ref 3.5–5.0)
Alkaline Phosphatase: 97 U/L (ref 38–126)
Anion gap: 8 (ref 5–15)
BUN: 14 mg/dL (ref 6–20)
CO2: 27 mmol/L (ref 22–32)
Calcium: 9.3 mg/dL (ref 8.9–10.3)
Chloride: 100 mmol/L — ABNORMAL LOW (ref 101–111)
Creatinine, Ser: 1.2 mg/dL (ref 0.61–1.24)
GFR calc Af Amer: >60 mL/min (ref >60)
GFR calc non Af Amer: 60 mL/min — ABNORMAL LOW (ref >60)
Glucose, Bld: 215 mg/dL — ABNORMAL HIGH (ref 65–99)
Potassium: 3 mmol/L — ABNORMAL LOW (ref 3.5–5.1)
Sodium: 135 mmol/L (ref 135–145)
Total Bilirubin: 0.6 mg/dL (ref 0.3–1.2)
Total Protein: 5.8 g/dL — ABNORMAL LOW (ref 6.5–8.1)

## 2015-04-05 LAB — MAGNESIUM: Magnesium: 1.6 mg/dL — ABNORMAL LOW (ref 1.7–2.4)

## 2015-04-05 LAB — GLUCOSE, CAPILLARY
Glucose-Capillary: 172 mg/dL — ABNORMAL HIGH (ref 65–99)
Glucose-Capillary: 183 mg/dL — ABNORMAL HIGH (ref 65–99)
Glucose-Capillary: 147 mg/dL — ABNORMAL HIGH (ref 65–99)
Glucose-Capillary: 185 mg/dL — ABNORMAL HIGH (ref 65–99)

## 2015-04-05 LAB — LIPASE, FLUID: Lipase-Fluid: 34 U/L

## 2015-04-05 MED ORDER — HYDROMORPHONE HCL 2 MG PO TABS: 2.0000 mg | ORAL_TABLET | ORAL | Status: DC | PRN

## 2015-04-05 MED ORDER — CARVEDILOL 12.5 MG PO TABS
12.5000 mg | ORAL_TABLET | Freq: Two times a day (BID) | ORAL | Status: DC
  Administered 2015-04-05 – 2015-04-06 (×2): 12.5 mg via ORAL
  Filled 2015-04-05 (×2): qty 1

## 2015-04-05 MED ORDER — POTASSIUM CHLORIDE CRYS ER 20 MEQ PO TBCR
20.0000 meq | EXTENDED_RELEASE_TABLET | Freq: Once | ORAL | Status: AC
  Administered 2015-04-05: 20 meq via ORAL
  Filled 2015-04-05: qty 1

## 2015-04-05 MED ORDER — POTASSIUM CHLORIDE CRYS ER 20 MEQ PO TBCR
20.0000 meq | EXTENDED_RELEASE_TABLET | Freq: Two times a day (BID) | ORAL | Status: DC
  Administered 2015-04-05: 20 meq via ORAL
  Filled 2015-04-05: qty 1

## 2015-04-05 MED ORDER — POTASSIUM CHLORIDE CRYS ER 20 MEQ PO TBCR
20.0000 meq | EXTENDED_RELEASE_TABLET | Freq: Two times a day (BID) | ORAL | Status: DC
  Administered 2015-04-05 – 2015-04-06 (×2): 20 meq via ORAL
  Filled 2015-04-05 (×3): qty 1

## 2015-04-05 MED ORDER — TAB-A-VITE/IRON PO TABS
1.0000 | ORAL_TABLET | Freq: Every day | ORAL | Status: DC
  Administered 2015-04-05 – 2015-04-06 (×2): 1 via ORAL
  Filled 2015-04-05 (×2): qty 1

## 2015-04-05 MED ORDER — GUAIFENESIN-DM 100-10 MG/5ML PO SYRP
10.0000 mL | ORAL_SOLUTION | ORAL | Status: DC | PRN
  Administered 2015-04-05: 10 mL via ORAL
  Filled 2015-04-05: qty 10

## 2015-04-05 MED ORDER — SODIUM CHLORIDE 0.9 % IV BOLUS (SEPSIS)
500.0000 mL | Freq: Once | INTRAVENOUS | Status: AC
  Administered 2015-04-05: 500 mL via INTRAVENOUS

## 2015-04-05 NOTE — Progress Notes (Signed)
Patient ID: Derek Mohr Sr., male   DOB: [birth year], 70 y.o.   MRN: [id number]  S: pt hypotensive after getting OOB to the chair with PT.  C/o dizziness prior to onset of symptoms.  Coreg resumed last night.  No LOC.  O: alert, awake, pale.  NAD.  BP 91/44.   A/P: Hypotension; improving, give 500cc fluid bolus. Reduce coreg to 12.5mg  BID which was started last night.  This is likely the cause.  Check orthostatic BP.  Check CBC and BMP.  Derek [name], ANP-BC

## 2015-04-05 NOTE — Progress Notes (Signed)
Physical Therapy Treatment Patient Details Name: Derek NEGLIA Sr. MRN: [id number] DOB: May 27, 1945 Today's Date: 04/05/2015    History of Present Illness 70yo male who is s/p pancreatectomy 2* cancer now readmitted with abscess. H/o B TKA, HTN, DM, Meniere's disease (with falls), obesity, anxiety    PT Comments    SIGNIFICANT DECLINE Pt appeared in his usually state of health. Assisted to EOB when pt c/o max dizziness.  BP 82/42.  Assisted to [facility] in hopes BP would increase with activity.  Pt remained dizzy and became pale in color.  BP 62/48.  Unable to attempt amb.  Quickly assisted to recliner with B LE elevated and fully reclined.  RN called to room.   Follow Up Recommendations        Equipment Recommendations       Recommendations for Other Services       Precautions / Restrictions Precautions Precautions: Fall Precaution Comments: drains L side and T chest tube      Hx of Meniere's disease Restrictions Weight Bearing Restrictions: No RLE Weight Bearing: Weight bearing as tolerated    Mobility  Bed Mobility Overal bed mobility: Needs Assistance Bed Mobility: Supine to Sit     Supine to sit: Min assist;Mod assist     General bed mobility comments: Assist for trunk and LEs. Increased time.  Once EOB pt able to sit Indep   Transfers Overall transfer level: Needs assistance Equipment used: Rolling walker (2 wheeled) Transfers: Sit to/from Stand Sit to Stand: Min assist;+2 safety/equipment         General transfer comment: assisted from EOB to [facility] pt c/o max dizziness.  BP 82/42      Ambulation/Gait             General Gait Details: unable to attempt amb due to hypotension    Stairs            Wheelchair Mobility    Modified Rankin (Stroke Patients Only)       Balance                                    Cognition   Behavior During Therapy: WFL for tasks assessed/performed Overall Cognitive Status: Within Functional  Limits for tasks assessed                      Exercises      General Comments        Pertinent Vitals/Pain Pain Assessment: No/denies pain    Home Living                      Prior Function            PT Goals (current goals can now be found in the care plan section) Progress towards PT goals: Progressing toward goals    Frequency       PT Plan      Co-evaluation             End of Session Equipment Utilized During Treatment: Gait belt Activity Tolerance: Treatment limited secondary to medical complications (Comment);Patient limited by fatigue (hypotension ) Patient left: in chair;with call bell/phone within reach;with family/visitor present     Time: 1100-1130 PT Time Calculation (min) (ACUTE ONLY): 30 min  Charges:  $Therapeutic Activity: 23-37 mins  G Codes:      Rica Koyanagi  PTA [facility]  Acute  Rehab Pager      [phone]

## 2015-04-05 NOTE — Progress Notes (Signed)
Patient ID: Derek Mohr Sr., male   DOB: [birth year], 70 y.o.   MRN: [id number]    Referring Physician(s): CCS  Chief Complaint:  Abdominal abscesses/left pleural effusion  Subjective:  Pt still with occ epigastric discomfort/intermittent coughing; had some nausea earlier today as well as orthostatic changes during attempts to ambulate; BP still a little soft  Allergies: Morphine and related; Quinine; Voltaren; and Oxycontin  Medications: Prior to Admission medications   Medication Sig Start Date End Date Taking? Authorizing Provider  carvedilol (COREG) 25 MG tablet Take 25 mg by mouth 2 (two) times daily with a meal.  09/26/11  Yes Historical Provider, MD  [employer] TRIPLE MAGNESIUM COMPLEX PO Take 1-2 capsules by mouth 2 (two) times daily. Two capsules in the morning and one at night   Yes Historical Provider, MD  diphenhydrAMINE (BENADRYL) 25 mg capsule Take 50 mg by mouth 2 (two) times daily. Reported on 03/17/2015   Yes Historical Provider, MD  HYDROmorphone (DILAUDID) 2 MG tablet Take 1 tablet (2 mg total) by mouth every 3 (three) hours as needed for moderate pain or severe pain. 03/03/15  Yes Armandina Gemma, MD  insulin aspart (NOVOLOG) 100 UNIT/ML injection Inject 0-9 Units into the skin 3 (three) times daily before meals. 03/03/15  Yes Armandina Gemma, MD  insulin glargine (LANTUS) 100 UNIT/ML injection Inject 0.2 mLs (20 Units total) into the skin daily. 03/03/15  Yes Armandina Gemma, MD  losartan (COZAAR) 50 MG tablet Take 50 mg by mouth daily. 11/06/14  Yes Historical Provider, MD  naproxen sodium (ALEVE) 220 MG tablet Take 440 mg by mouth 2 (two) times daily with a meal.    Yes Historical Provider, MD  aspirin EC 81 MG tablet Take 81 mg by mouth at bedtime.    Historical Provider, MD  clobetasol ointment (TEMOVATE) [id number] % Apply 1 application topically daily as needed (for peeling skin on hands and feet). Reported on 03/17/2015    Historical Provider, MD  HYDROmorphone (DILAUDID) 2 MG tablet Take  1 tablet (2 mg total) by mouth every 4 (four) hours as needed for moderate pain or severe pain. 04/05/15   Armandina Gemma, MD  Insulin Syringes, Disposable, U-100 0.5 ML MISC 20 Units by Does not apply route daily. 03/03/15   Armandina Gemma, MD  lansoprazole (PREVACID) 15 MG capsule Take 30 mg by mouth daily as needed (reflux). Reported on 03/17/2015    Historical Provider, MD  [facility] DELICA LANCETS [id number] MISC 1 Device by Does not apply route 4 (four) times daily. 03/17/15   Renato Shin, MD     Vital Signs: BP 93/48 mmHg  Pulse 63  Temp(Src) 98 F (36.7 C) (Oral)  Resp 18  Ht 6' 2"$  (1.88 m)  Wt 238 lb 12.1 oz (108.3 kg)  BMI 30.64 kg/m2  SpO2 94%  Physical Examleft chest drain intact, no air leak, output 75 cc yellow fluid; cx's neg; left abd drains intact, outputs 50-55 cc each today  Imaging: Dg Chest Port 1 View  04/05/2015  CLINICAL DATA:  LEFT pleural effusion EXAM: PORTABLE CHEST 1 VIEW COMPARISON:  Portable exam 0933 hours compared to 04/04/2015 FINDINGS: LEFT pleural pigtail drainage catheters again identified. Stable heart size and pulmonary vascularity. Density at the inferior mediastinum corresponding to hiatal hernia on a prior CT abdomen exam. Atherosclerotic calcification aorta. Persistent atelectasis and pleural thickening versus fluid at lateral and inferior LEFT hemithorax. No pneumothorax. Bones unremarkable. IMPRESSION: Stable LEFT pleural drainage catheters with atelectasis in the lower LEFT lung and  residual pleural thickening versus minimal fluid at inferior LEFT hemi thorax. No significant interval change. Electronically Signed   By: Lavonia Dana M.D.   On: 04/05/2015 10:43   Dg Chest Port 1 View  04/04/2015  CLINICAL DATA:  Evaluate left-sided pleural effusion status post chest tube placement. Initial encounter. EXAM: PORTABLE CHEST 1 VIEW COMPARISON:  Chest radiograph performed 04/01/2015 FINDINGS: The left-sided pleural effusion has decreased further in size, though there is  residual left lateral pleural thickening, reflecting residual fluid. Underlying vascular congestion is again noted. Increased interstitial markings raise concern for mild interstitial edema. No pneumothorax is seen. The cardiomediastinal silhouette is borderline normal in size. No acute osseous abnormalities are identified. The patient is status post right-sided rotator cuff repair. IMPRESSION: Left-sided pleural effusion has decreased further in size, though there is residual left lateral pleural thickening, reflecting residual fluid. Underlying vascular congestion again noted. Increased interstitial markings raise concern for mild interstitial edema. Electronically Signed   By: Garald Balding M.D.   On: 04/04/2015 07:19    Labs:  CBC:  Recent Labs  03/24/15 0330 03/26/15 0508 03/30/15 0529 04/05/15 1155  WBC 15.0* 13.2* 11.5* 7.9  HGB 10.1* 9.4* 8.4* 7.8*  HCT 33.2* 30.8* 27.0* 25.3*  PLT 992* 936* 950* 812*    COAGS:  Recent Labs  02/19/15 0920 03/18/15 1859  INR 1.07 1.33  APTT 28  --     BMP:  Recent Labs  03/27/15 0544 03/29/15 0456 04/01/15 0457 04/05/15 1155  NA 130* 133* 135 135  K 3.9 3.7 3.1* 3.0*  CL 98* 100* 101 100*  CO2 22 23 24 27  $ GLUCOSE 154* 137* 124* 215*  BUN 12 11 11 14  $ CALCIUM 9.8 9.8 9.5 9.3  CREATININE 1.07 1.04 1.03 1.20  GFRNONAA >60 >60 >60 60*  GFRAA >60 >60 >60 >60    LIVER FUNCTION TESTS:  Recent Labs  02/19/15 0920 03/18/15 1859 03/23/15 0340 04/05/15 1155  BILITOT 0.6 0.7 0.9 0.6  AST 19 34 29 19  ALT 23 31 23 $ 12*  ALKPHOS 83 147* 155* 97  PROT 6.9 7.1 6.5 5.8*  ALBUMIN 4.2 2.7* 2.2* 2.0*    Assessment and Plan: S/p distal pancreatectomy and splenectomy secondary to neuroendocrine tumor of pancreas; now with postoperative fluid collections and pseudocyst formation; s/p left abd drain placement x2 on 1/28 ; upsizing of left mid abd drain/new left chest drain 2/8; AF; WBC nl; hgb 7.8; K 3.0; creat nl; pleural fluid cx's  neg; CXR today with minimal left effusion/no ptx; replace K; monitor CBC; would maintain chest drain until day of pt discharge; abd drains to remain in place- IR drain clinic f/u can be arranged after discharge.    Electronically Signed: D. Rowe Robert 04/05/2015, 4:16 PM   I spent a total of 15 minutes at the the patient's bedside AND on the patient's hospital floor or unit, greater than 50% of which was counseling/coordinating care for left abd/chest drains

## 2015-04-05 NOTE — Progress Notes (Signed)
RN called to room by Physical Therapy d/t patient's BP being low. BP was 62/48 per Physical Therapy while the patient was sitting on the side of the bed. Patient c/o of dizziness that lasted about 15 minutes, even after the patient had been lying back in the chair for that time period. The patient was also very pale in color and lethargic at this time. Subsequent BPs were 77/54, 82/48 and then 93/48. The last BP was taken by the NP at the bedside. HR sustained about 63-65 during this time. Patient's oxygen saturations were also found to be low at the onset of this episode. They were 87% on RA. The patient was placed on 2L O2 via [place]. The NP gave a verbal order to cancel the discharge order for today. Orders to give a 500cc NS bolus and  labs are to be checked per orders. The patient's BP will continue to be monitored by the bedside RN.   Othella Boy[facility] 04/05/2015

## 2015-04-05 NOTE — Progress Notes (Signed)
Patient ID: Derek Mohr Sr., male   DOB: 08/15/1945, 70 y.o.   MRN: [id number]  [facility]  Subjective: Patient in bed, about to start physical therapy and ambulate.  No new complaints.  Abdominal pain resolved.  Objective: Vital signs in last 24 hours: Temp:  [97.5 F (36.4 C)-99.8 F (37.7 C)] 97.5 F (36.4 C) (02/13 0455) Pulse Rate:  [63-98] 63 (02/13 0455) Resp:  [18-20] 18 (02/13 0455) BP: (101-159)/(58-74) 125/65 mmHg (02/13 0455) SpO2:  [90 %-94 %] 94 % (02/13 0455) Last BM Date: 04/01/15  Intake/Output from previous day: 02/12 0701 - 02/13 0700 In: 850 [P.O.:840] Out: 605 [Urine:500; Drains:105] Intake/Output this shift:    Physical Exam: HEENT - sclerae clear, mucous membranes moist Neck - soft Chest - thoracostomy with minimal output overnight, clear serous in tubing Abdomen - soft without distension; no tenderness; drains with small tan cloudy fluid Ext - no edema, non-tender Neuro - alert & oriented, no focal deficits  Lab Results:  No results for input(s): WBC, HGB, HCT, PLT in the last 72 hours. BMET No results for input(s): NA, K, CL, CO2, GLUCOSE, BUN, CREATININE, CALCIUM in the last 72 hours. PT/INR No results for input(s): LABPROT, INR in the last 72 hours. Comprehensive Metabolic Panel:    Component Value Date/Time   NA 135 04/01/2015 0457   NA 133* 03/29/2015 0456   K 3.1* 04/01/2015 0457   K 3.7 03/29/2015 0456   CL 101 04/01/2015 0457   CL 100* 03/29/2015 0456   CO2 24 04/01/2015 0457   CO2 23 03/29/2015 0456   BUN 11 04/01/2015 0457   BUN 11 03/29/2015 0456   CREATININE 1.03 04/01/2015 0457   CREATININE 1.04 03/29/2015 0456   GLUCOSE 124* 04/01/2015 0457   GLUCOSE 137* 03/29/2015 0456   CALCIUM 9.5 04/01/2015 0457   CALCIUM 9.8 03/29/2015 0456   AST 29 03/23/2015 0340   AST 34 03/18/2015 1859   ALT 23 03/23/2015 0340   ALT 31 03/18/2015 1859   ALKPHOS 155* 03/23/2015 0340   ALKPHOS 147*  03/18/2015 1859   BILITOT 0.9 03/23/2015 0340   BILITOT 0.7 03/18/2015 1859   PROT 6.5 03/23/2015 0340   PROT 7.1 03/18/2015 1859   ALBUMIN 2.2* 03/23/2015 0340   ALBUMIN 2.7* 03/18/2015 1859    Studies/Results: Dg Chest Port 1 View  04/05/2015  CLINICAL DATA:  LEFT pleural effusion EXAM: PORTABLE CHEST 1 VIEW COMPARISON:  Portable exam 0933 hours compared to 04/04/2015 FINDINGS: LEFT pleural pigtail drainage catheters again identified. Stable heart size and pulmonary vascularity. Density at the inferior mediastinum corresponding to hiatal hernia on a prior CT abdomen exam. Atherosclerotic calcification aorta. Persistent atelectasis and pleural thickening versus fluid at lateral and inferior LEFT hemithorax. No pneumothorax. Bones unremarkable. IMPRESSION: Stable LEFT pleural drainage catheters with atelectasis in the lower LEFT lung and residual pleural thickening versus minimal fluid at inferior LEFT hemi thorax. No significant interval change. Electronically Signed   By: Lavonia Dana M.D.   On: 04/05/2015 10:43   Dg Chest Port 1 View  04/04/2015  CLINICAL DATA:  Evaluate left-sided pleural effusion status post chest tube placement. Initial encounter. EXAM: PORTABLE CHEST 1 VIEW COMPARISON:  Chest radiograph performed 04/01/2015 FINDINGS: The left-sided pleural effusion has decreased further in size, though there is residual left lateral pleural thickening, reflecting residual fluid. Underlying vascular congestion is again noted. Increased interstitial markings raise concern for mild interstitial edema. No pneumothorax is seen. The cardiomediastinal silhouette is  borderline normal in size. No acute osseous abnormalities are identified. The patient is status post right-sided rotator cuff repair. IMPRESSION: Left-sided pleural effusion has decreased further in size, though there is residual left lateral pleural thickening, reflecting residual fluid. Underlying vascular congestion again noted. Increased  interstitial markings raise concern for mild interstitial edema. Electronically Signed   By: Garald Balding M.D.   On: 04/04/2015 07:19    Assessment & Plans: Status post distal pancreatectomy and splenectomy  Drains in peripancreatic abscess/fluid collection - slowly decreasing output  Thoracostomy for sympathetic effusion with minimal output - likely out today  Regular diet  Working with physical therapy  Will arrange follow up with me at [place] office  Will arrange follow up with IR drain clinic to monitor and remove drains  OK for discharge to [facility] when be available  Discussed with patient and with wife by telephone today  [name], MD, [facility] Office: [place] 04/05/2015

## 2015-04-05 NOTE — Discharge Summary (Addendum)
Physician Discharge Summary [facility]  Patient ID: Derek [name]. MRN: [id number] DOB/AGE: 05/25/1945 70 y.o.  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  POSTOPERATIVE DIAGNOSIS: Neuroendocrine tumor of the pancreas.  PROCEDURE: Distal pancreatectomy and splenectomy.  SURGEON: [name], M.D.  ASSISTANT: [name], M.D.  ANESTHESIA: General.  ESTIMATED BLOOD LOSS: 200 mL.  PREPARATION: ChloraPrep.  COMPLICATIONS: None.  INDICATIONS: The patient is a 70 year old male referred by Dr. Oretha Caprice for neuroendocrine tumor of the pancreas. The patient had presented to his primary care physician with lower abdominal pain in August 2016. The patient underwent abdominal ultrasound, which showed a hypoechoic lesion in the body of the pancreas. CT scan of the abdomen and pelvis was performed and confirmed a 2-cm mass in the midbody of the pancreas. Further evaluation included upper endoscopy with endoscopic ultrasound and biopsy. The fine-needle aspiration showed a neuroendocrine neoplasm. The patient now comes to Surgery for resection for definitive  diagnosis and management.    Discharged Condition: good  Hospital Course: patient admitted from home with abdominal pain, nausea, and emesis.  CT scan demonstrated post-op peri-pancreatic fluid collection and abscess.  IR performed percutaneous drainage.  Patient received 14 days of IV Invanz and vancomycin for staph abscess.  Sympathetic effusion in chest drained by IR with thoracostomy tube.  Diet slowly advanced.  Working with physical therapy for deconditioning.  Anticipate discharge to SNF ([facility]) for rehab prior to going home.  Will see in follow up at [place] office and IR drain clinic.  Consults: Interventional Radiology  Treatments: percutaneous drainage procedures, IV abx's  Discharge Exam: Blood pressure 125/65, pulse 63, temperature 97.5 F (36.4 C), temperature source Oral, resp. rate 18, height 6\' 2"  (1.88 m), weight 108.3 kg (238 lb 12.1 oz), SpO2 94 %.  Bowel function: Flatus: y BM: y Drain: chest thin serous abd milky  Physical Exam:  General: Pt awake/alert/oriented x4 in nocute distress Eyes: PERRL, normal EOM. Sclera clear. No icterus Neuro: CN II-XII intact w/o focal sensory/motor deficits. Lymph: No head/neck/groin lymphadenopathy Psych: No delerium/psychosis/paranoia HENT: Normocephalic, Mucus membranes moist. No thrush Neck: Supple, No tracheal deviation Chest: No chest wall pain w good excursion CV: Pulses intact. Regular rhythm MS: Normal AROM mjr joints. No obvious deformity Abdomen: Soft. Nondistended. Nontender. Incision closed. No peritonitis. No incarcerated hernias. Ext: SCDs BLE. No mjr edema. No cyanosis Skin: No petechiae / purpura  Disposition: SNF for rehab  Discharge Instructions    AMB Referral to [facility]    Complete by:  As directed   Please assign to [place] for transition of care. Currently at [facility]. Verbal consent received. Wife- Derek [name] is primary contact at  [phone]. Please call with questions. Thanks. Marthenia Rolling, [place], [facility] [phone]  Reason for consult:  Please assign to [place]. Active with Telephonic RNCM currently.  Diagnoses of:  Diabetes  Expected date of contact:  1-3 days (reserved for hospital discharges)     Diet - low sodium heart healthy    Complete by:  As directed      Discharge instructions    Complete by:  As directed   [place]:   Carry a list of your medications and allergies with you at all times  Call your pharmacy at least 1 week in advance to refill prescriptions  Do not mix any prescribed pain medicine with alcohol  Do not drive any motor vehicles while taking pain medication  Take medications with food unless otherwise directed  Follow-up appointments (date to return to physician): Please call [phone] to confirm your follow up appointment with your surgeon.  Call your Surgeon if you have:  Temperature greater than 101.0  Persistent nausea and vomiting  Severe uncontrolled pain  Redness, tenderness, or signs of infection (pain, swelling, redness, odor or    green/yellow discharge around the site)  Difficulty breathing, headache or visual disturbances  Hives  Persistent dizziness or light-headedness  Any other questions or concerns you may have after discharge  In an emergency, call 911 or go to an Emergency Department at a nearby hospital.   Diet: Begin with liquids, and if they are tolerated, resume your usual diet.  Avoid spicy, greasy or heavy foods.  If you have nausea or vomiting, go back to liquids.  If you cannot keep liquids down, call your doctor.  Avoid alcohol consumption while on prescription pain medications. Good nutrition promotes healing. Increase fiber and fluids.   ADDITIONAL INSTRUCTIONS: Drain care as instructed by interventional radiology.  Follow up to be arranged at  [facility] by IR.  Follow up to be arranged at [place] office in 2 weeks with Dr. Armandina Gemma.  [name], MD, [facility] Office: [phone]     Increase activity slowly    Complete by:  As directed             Medication List    TAKE these medications        ALEVE 220 MG tablet  Generic drug:  naproxen sodium  Take 440 mg by mouth 2 (two) times daily with a meal.     aspirin EC 81 MG tablet  Take 81 mg by mouth at bedtime.     carvedilol 25 MG tablet  Commonly known as:  COREG  Take 25 mg by mouth 2 (two) times daily with a meal.     clobetasol ointment 0.05 %  Commonly known as:  TEMOVATE  Apply 1 application topically daily as needed (for peeling skin on hands and feet). Reported on 03/17/2015     [employer] TRIPLE MAGNESIUM COMPLEX PO  Take 1-2 capsules by mouth 2 (two) times daily. Two capsules in the morning and one at night     diphenhydrAMINE 25 mg capsule  Commonly known as:  BENADRYL  Take 50 mg by mouth 2 (two) times daily. Reported on 03/17/2015     HYDROmorphone 2 MG tablet  Commonly known as:  DILAUDID  Take 1 tablet (2 mg total) by mouth every 3 (three) hours as needed for moderate pain or severe pain.     HYDROmorphone 2 MG tablet  Commonly known as:  DILAUDID  Take 1 tablet (2 mg total)  by mouth every 4 (four) hours as needed for moderate pain or severe pain.     insulin aspart 100 UNIT/ML injection  Commonly known as:  NOVOLOG  Inject 0-9 Units into the skin 3 (three) times daily before meals.     insulin glargine 100 UNIT/ML injection  Commonly known as:  LANTUS  Inject 0.2 mLs (20 Units total) into the skin daily.     Insulin Syringes (Disposable) U-100 0.5 ML Misc  20 Units by Does not apply route daily.     lansoprazole 15 MG capsule  Commonly known as:  PREVACID  Take 30 mg by mouth daily as needed (reflux). Reported on 03/17/2015     losartan 50 MG tablet  Commonly known as:  COZAAR   Take 50 mg by mouth daily.     ONETOUCH DELICA LANCETS [id number] Misc  1 Device by Does not apply route 4 (four) times daily.           Follow-up Information    Follow up with [name], MD. Schedule an appointment as soon as possible for a visit in 2 weeks.   Specialty:  General Surgery   Why:  For wound re-check   Contact information:   [place] [address] [phone]       [name], MD, [facility] Office: [phone]   Signed: [name] 04/05/2015, 11:20 AM

## 2015-04-05 NOTE — Progress Notes (Signed)
CSW continuing to follow.   Pt plans for [facility] and Rehab when medically ready.  Pt had hypotensive episode today and d/c for today cancelled. Per [facility] PA, anticipate d/c tomorrow. Per chart, pleurx drain planned to be removed today.  CSW updated pt wife and [facility].   CSW updated pt insurance [employer].   CSW to continue to follow to provide support and assist with pt discharge to [facility] when pt medically ready.  CSW to continue to follow.  Alison Murray, MSW, [place] Work [phone]

## 2015-04-06 ENCOUNTER — Encounter: Payer: Self-pay | Admitting: *Deleted

## 2015-04-06 ENCOUNTER — Other Ambulatory Visit: Payer: Self-pay | Admitting: Radiology

## 2015-04-06 DIAGNOSIS — K651 Peritoneal abscess: Secondary | ICD-10-CM | POA: Diagnosis not present

## 2015-04-06 DIAGNOSIS — D499 Neoplasm of unspecified behavior of unspecified site: Secondary | ICD-10-CM | POA: Diagnosis not present

## 2015-04-06 DIAGNOSIS — K219 Gastro-esophageal reflux disease without esophagitis: Secondary | ICD-10-CM | POA: Diagnosis not present

## 2015-04-06 DIAGNOSIS — R1909 Other intra-abdominal and pelvic swelling, mass and lump: Secondary | ICD-10-CM | POA: Diagnosis not present

## 2015-04-06 DIAGNOSIS — T814XXD Infection following a procedure, subsequent encounter: Principal | ICD-10-CM

## 2015-04-06 DIAGNOSIS — T814XXA Infection following a procedure, initial encounter: Secondary | ICD-10-CM | POA: Diagnosis not present

## 2015-04-06 DIAGNOSIS — IMO0001 Reserved for inherently not codable concepts without codable children: Secondary | ICD-10-CM

## 2015-04-06 DIAGNOSIS — E119 Type 2 diabetes mellitus without complications: Secondary | ICD-10-CM | POA: Diagnosis not present

## 2015-04-06 LAB — GLUCOSE, CAPILLARY
Glucose-Capillary: 164 mg/dL — ABNORMAL HIGH (ref 65–99)
Glucose-Capillary: 138 mg/dL — ABNORMAL HIGH (ref 65–99)

## 2015-04-06 LAB — BASIC METABOLIC PANEL
Anion gap: 8 (ref 5–15)
BUN: 13 mg/dL (ref 6–20)
CO2: 27 mmol/L (ref 22–32)
Calcium: 9.5 mg/dL (ref 8.9–10.3)
Chloride: 103 mmol/L (ref 101–111)
Creatinine, Ser: 1.25 mg/dL — ABNORMAL HIGH (ref 0.61–1.24)
GFR calc Af Amer: >60 mL/min (ref >60)
GFR calc non Af Amer: 57 mL/min — ABNORMAL LOW (ref >60)
Glucose, Bld: 105 mg/dL — ABNORMAL HIGH (ref 65–99)
Potassium: 3.4 mmol/L — ABNORMAL LOW (ref 3.5–5.1)
Sodium: 138 mmol/L (ref 135–145)

## 2015-04-06 MED ORDER — MAGNESIUM OXIDE 400 (241.3 MG) MG PO TABS
400.0000 mg | ORAL_TABLET | Freq: Two times a day (BID) | ORAL | Status: DC
  Administered 2015-04-06: 400 mg via ORAL
  Filled 2015-04-06: qty 1

## 2015-04-06 MED ORDER — CLOBETASOL PROPIONATE 0.05 % EX OINT
1.0000 "application " | TOPICAL_OINTMENT | Freq: Every day | CUTANEOUS | Status: DC | PRN
  Filled 2015-04-06: qty 15

## 2015-04-06 MED ORDER — CARVEDILOL 25 MG PO TABS: 12.5000 mg | ORAL_TABLET | Freq: Two times a day (BID) | ORAL | Status: DC

## 2015-04-06 MED ORDER — MAGNESIUM SULFATE 2 GM/50ML IV SOLN: 2.0000 g | Freq: Once | INTRAVENOUS | Status: DC

## 2015-04-06 MED FILL — CARVEDILOL 25 MG TABLET: 25 | 60 days supply | Qty: 60 | Fill #0

## 2015-04-06 NOTE — Progress Notes (Addendum)
Pt for discharge to [facility].  CSW facilitated pt discharge needs including contacting facility,faxing pt discharge information via [employer], discussing with pt at bedside and left message with pt wife, Ivin Booty via telephone, providing RN phone number to call report Ritta Slot [phone]), confirming [facility] authorization received Josem Kaufmann #[id number]), and arranging and ambulance transport for pt to [place] feels that he is slowly improving and eager for rehab.  No further social work needs identified at this time.  CSW signing off.   Alison Murray, MSW, [place] [facility] [phone]

## 2015-04-06 NOTE — Clinical Social Work Placement (Signed)
   CLINICAL SOCIAL WORK PLACEMENT  NOTE  Date:  04/06/2015  Patient Details  Name: Derek ROHRBAUGH Sr. MRN: [id number] Date of Birth: 09-Nov-1945  Clinical Social Work is seeking post-discharge placement for this patient at the [place] level of care (*CSW will initial, date and re-position this form in  chart as items are completed):  Yes   Patient/family provided with [place] Work Department's list of facilities offering this level of care within the geographic area requested by the patient (or if unable, by the patient's family).  Yes   Patient/family informed of their freedom to choose among providers that offer the needed level of care, that participate in Medicare, Medicaid or managed care program needed by the patient, have an available bed and are willing to accept the patient.  Yes   Patient/family informed of [facility]'s ownership interest in [facility] and [facility], as well as of the fact that they are under no obligation to receive care at these facilities.  PASRR submitted to EDS on 04/01/15     PASRR number received on 04/01/15     Existing PASRR number confirmed on       FL2 transmitted to all facilities in geographic area requested by pt/family on 04/01/15     FL2 transmitted to all facilities within larger geographic area on       Patient informed that his/her managed care company has contracts with or will negotiate with certain facilities, including the following:        Yes   Patient/family informed of bed offers received.  Patient chooses bed at [facility]     Physician recommends and patient chooses bed at      Patient to be transferred to [facility] on 04/06/15.  Patient to be transferred to facility by ambulance Corey Harold)     Patient family notified on 04/06/15 of transfer.  Name of family member notified:  pt notified at bedside and pt wife, Derek [name] notified via telephone      PHYSICIAN       Additional Comment:    _______________________________________________ Ladell Pier, LCSW 04/06/2015, 12:29 PM

## 2015-04-06 NOTE — Progress Notes (Signed)
Patient ID: Derek Mohr Sr., male   DOB: Apr 03, 1945, 70 y.o.   MRN: [id number]    Referring Physician(s): [facility]  Chief Complaint:  Abdominal abscesses, left pleural effusion  Subjective:  Pt doing ok; has occ cough with some sinus/nasal drainage; "ready to get out of hospital"  Allergies: Morphine and related; Quinine; Voltaren; and Oxycontin  Medications: Prior to Admission medications   Medication Sig Start Date End Date Taking? Authorizing Provider  [employer] TRIPLE MAGNESIUM COMPLEX PO Take 1-2 capsules by mouth 2 (two) times daily. Two capsules in the morning and one at night   Yes Historical Provider, MD  diphenhydrAMINE (BENADRYL) 25 mg capsule Take 50 mg by mouth 2 (two) times daily. Reported on 03/17/2015   Yes Historical Provider, MD  HYDROmorphone (DILAUDID) 2 MG tablet Take 1 tablet (2 mg total) by mouth every 3 (three) hours as needed for moderate pain or severe pain. 03/03/15  Yes Armandina Gemma, MD  insulin aspart (NOVOLOG) 100 UNIT/ML injection Inject 0-9 Units into the skin 3 (three) times daily before meals. 03/03/15  Yes Armandina Gemma, MD  insulin glargine (LANTUS) 100 UNIT/ML injection Inject 0.2 mLs (20 Units total) into the skin daily. 03/03/15  Yes Armandina Gemma, MD  losartan (COZAAR) 50 MG tablet Take 50 mg by mouth daily. 11/06/14  Yes Historical Provider, MD  naproxen sodium (ALEVE) 220 MG tablet Take 440 mg by mouth 2 (two) times daily with a meal.    Yes Historical Provider, MD  aspirin EC 81 MG tablet Take 81 mg by mouth at bedtime.    Historical Provider, MD  carvedilol (COREG) 25 MG tablet Take 0.5 tablets (12.5 mg total) by mouth 2 (two) times daily with a meal. 04/06/15   Michael Boston, MD  clobetasol ointment (TEMOVATE) [id number] % Apply 1 application topically daily as needed (for peeling skin on hands and feet). Reported on 03/17/2015    Historical Provider, MD  HYDROmorphone (DILAUDID) 2 MG tablet Take 1 tablet (2 mg total) by mouth every 4 (four) hours as needed for  moderate pain or severe pain. 04/05/15   Armandina Gemma, MD  Insulin Syringes, Disposable, U-100 0.5 ML MISC 20 Units by Does not apply route daily. 03/03/15   Armandina Gemma, MD  lansoprazole (PREVACID) 15 MG capsule Take 30 mg by mouth daily as needed (reflux). Reported on 03/17/2015    Historical Provider, MD  [facility] DELICA LANCETS [id number] MISC 1 Device by Does not apply route 4 (four) times daily. 03/17/15   Renato Shin, MD     Vital Signs: BP 149/63 mmHg  Pulse 68  Temp(Src) 97.7 F (36.5 C) (Oral)  Resp 20  Ht 6\' 2"  (1.88 m)  Wt 238 lb 12.1 oz (108.3 kg)  BMI 30.64 kg/m2  SpO2 92%  Physical Exam left abd drains intact, outputs 50 cc medial(upsized) drain, 30 cc lateral drain; both drain irrigated with sterile NS; left chest drain removed today in its entirety and vaseline gauze dressing applied to site  Imaging: Dg Chest Port 1 View  04/05/2015  CLINICAL DATA:  LEFT pleural effusion EXAM: PORTABLE CHEST 1 VIEW COMPARISON:  Portable exam 0933 hours compared to 04/04/2015 FINDINGS: LEFT pleural pigtail drainage catheters again identified. Stable heart size and pulmonary vascularity. Density at the inferior mediastinum corresponding to hiatal hernia on a prior CT abdomen exam. Atherosclerotic calcification aorta. Persistent atelectasis and pleural thickening versus fluid at lateral and inferior LEFT hemithorax. No pneumothorax. Bones unremarkable. IMPRESSION: Stable LEFT pleural drainage catheters with atelectasis in  the lower LEFT lung and residual pleural thickening versus minimal fluid at inferior LEFT hemi thorax. No significant interval change. Electronically Signed   By: Lavonia Dana M.D.   On: 04/05/2015 10:43   Dg Chest Port 1 View  04/04/2015  CLINICAL DATA:  Evaluate left-sided pleural effusion status post chest tube placement. Initial encounter. EXAM: PORTABLE CHEST 1 VIEW COMPARISON:  Chest radiograph performed 04/01/2015 FINDINGS: The left-sided pleural effusion has decreased further in  size, though there is residual left lateral pleural thickening, reflecting residual fluid. Underlying vascular congestion is again noted. Increased interstitial markings raise concern for mild interstitial edema. No pneumothorax is seen. The cardiomediastinal silhouette is borderline normal in size. No acute osseous abnormalities are identified. The patient is status post right-sided rotator cuff repair. IMPRESSION: Left-sided pleural effusion has decreased further in size, though there is residual left lateral pleural thickening, reflecting residual fluid. Underlying vascular congestion again noted. Increased interstitial markings raise concern for mild interstitial edema. Electronically Signed   By: Garald Balding M.D.   On: 04/04/2015 07:19    Labs:  CBC:  Recent Labs  03/24/15 0330 03/26/15 0508 03/30/15 0529 04/05/15 1155  WBC 15.0* 13.2* 11.5* 7.9  HGB 10.1* 9.4* 8.4* 7.8*  HCT 33.2* 30.8* 27.0* 25.3*  PLT 992* 936* 950* 812*    COAGS:  Recent Labs  02/19/15 0920 03/18/15 1859  INR 1.07 1.33  APTT 28  --     BMP:  Recent Labs  03/29/15 0456 04/01/15 0457 04/05/15 1155 04/06/15 0528  NA 133* 135 135 138  K 3.7 3.1* 3.0* 3.4*  CL 100* 101 100* 103  CO2 23 24 27 27   GLUCOSE 137* 124* 215* 105*  BUN 11 11 14 13   CALCIUM 9.8 9.5 9.3 9.5  CREATININE 1.04 1.03 1.20 1.25*  GFRNONAA >60 >60 60* 57*  GFRAA >60 >60 >60 >60    LIVER FUNCTION TESTS:  Recent Labs  02/19/15 0920 03/18/15 1859 03/23/15 0340 04/05/15 1155  BILITOT 0.6 0.7 0.9 0.6  AST 19 34 29 19  ALT 23 31 23  12*  ALKPHOS 83 147* 155* 97  PROT 6.9 7.1 6.5 5.8*  ALBUMIN 4.2 2.7* 2.2* 2.0*    Assessment and Plan: S/p distal pancreatectomy and splenectomy secondary to neuroendocrine tumor of pancreas; now with postoperative fluid collections and pseudocyst formation; s/p left abd drain placement x2 on 1/28 ; upsizing of left mid abd drain/new left chest drain 2/8; AF; left chest drain removed  today; check orthostatic BP's; maintain abd drains for now; will set pt up for drain clinic f/u in next 1-2 weeks; rec once daily irrigation of drains with 10 cc sterile NS, recording of output and daily dressing changes; call [phone] with any questions after d/c   Electronically Signed: D. Rowe Robert 04/06/2015, 9:19 AM   I spent a total of 15 minutes at the the patient's bedside AND on the patient's hospital floor or unit, greater than 50% of which was counseling/coordinating care for abdominal abscess drains

## 2015-04-06 NOTE — Discharge Instructions (Signed)
DRAIN CARE:   You have a closed bulb drain to help you heal.    A bulb drain is a small, plastic reservoir which creates a gentle suction. It is used to remove excess fluid from a surgical wound. The color and amount of fluid will vary. Immediately after surgery, the fluid is bright red. It may gradually change to a yellow color. When the amount decreases to about 1 or 2 tablespoons (15 to 30 cc) per 24 hours, your caregiver will usually remove it.  JP Care  The Jackson-Pratt drainage system has flexible tubing attached to a soft, plastic bulb with a stopper. The drainage end of the tubing, which is flat and white, goes into your body through a small opening near your incision (surgical cut). A stitch holds the drainage end in place. The rest of the tube is outside your body, attached to the bulb. When the bulb is compressed with the stopper in place, it creates a vacuum. This causes a constant gentle suction, which helps draw out fluid that collects under your incision. The bulb should be compressed at all times, except when you are emptying the drainage.  How long you will have your Jackson-Pratt depends on your surgery and the amount of fluid is draining. This is different for everyone. The Jackson-Pratt is usually removed when the drainage is 30 mL or less over 24 hours. To keep track of how much drainage youre having, you will record the amount in a drainage log. Its important to bring the log with you to your follow-up appointments.  Caring for Your Jackson-Pratt at Home In order to care for your Jackson-Pratt at home, you or your caregiver will do the following:  Empty the drain once a day and record the color and amount of drainage  Care for the area where the tubing enters your skin by washing with soap and water.  Milk the tubing to help move clots into the bulb.  Do this before you empty and measure your drainage. Look in the mirror at the tubing. This will help you see where your  hands need to be. Pinch the tubing close to where it goes into your skin between your thumb and forefinger. With the thumb and forefinger of your other hand, pinch the tubing right below your other fingers. Keep your fingers pinched and slide them down the tubing, pushing any clots down toward the bulb. You may want to use alcohol swabs to help you slide your fingers down the tubing. Repeat steps 3 and 4 as necessary to push clots from the tubing into the bulb. If you are not able to move a clot into the bulb, call your doctors office. The fluid may leak around the insertion site if a clot is blocking the drainage flow. If there is fluid in the bulb and no leakage at the insertion site, the drain is working.  How to Empty Your Jackson-Pratt and Record the Drainage You will need to empty your Jackson-Pratt every day  Gather the following supplies:  Measuring container your nurse gave you Jackson-Pratt Drainage Record  Pen or pencil  Instructions Clean an area to work on. Clean your hands thoroughly. Unplug the stopper on top of your Jackson-Pratt. This will cause the bulb to expand. Do not touch the inside of the stopper or the inner area of the opening on the bulb. Turn your Jackson-Pratt upside down, gently squeeze the bulb, and pour the drainage into the measuring container. Turn your Jackson-Pratt right  side up. Squeeze the bulb until your fingers feel the palm of your hand. Keep squeezing the bulb while you replug the stopper. Make sure the bulb stays fully compressed to ensure constant, gentle suction.    Check the amount and color of drainage in the measuring container. The first couple days after surgery the fluid may be dark red. This is normal. As you heal the fluid may look pink or pale yellow. Record this amount and the color of drainage on your Jackson-Pratt Drainage Record. Flush the drainage down the toilet and rinse the measuring container with water.  Caring for the  Insertion Site Once you have emptied the drainage, clean your hands again. Check the area around the insertion site. Look for tenderness, swelling, or pus. If you have any of these, or if you have a temperature of 101 F (38.3 C) or higher, you may have an infection. Call your doctors office.  Sometimes, the drain causes redness the size of a dime at your insertion site. This is normal. Your healthcare provider will tell you if you should place a bandage over the insertion site.      DAILY CARE  Keep the bulb compressed at all times, except while emptying it. The compression creates suction.   Keep sites where the tubes enter the skin dry and covered with a light bandage (dressing).   Tape the tubes to your skin, 1 to 2 inches below the insertion sites, to keep from pulling on your stitches. Tubes are stitched in place and will not slip out.   Pin the bulb to your shirt (not to your pants) with a safety pin.   For the first few days after surgery, there usually is more fluid in the bulb. Empty the bulb whenever it becomes half full because the bulb does not create enough suction if it is too full. Include this amount in your 24 hour totals.   When the amount of drainage decreases, empty the bulb at the same time every day. Write down the amounts and the 24 hour totals. Your caregiver will want to know them. This helps your caregiver know when the tubes can be removed.   (We anticipate removing the drain in 1-3 weeks, depending on when the output is <22m a day for 2+ days)  If there is drainage around the tube sites, change dressings and keep the area dry. If you see a clot in the tube, leave it alone. However, if the tube does not appear to be draining, let your caregiver know.  TO EMPTY THE BULB  Open the stopper to release suction.   Holding the stopper out of the way, pour drainage into the measuring cup that was sent home with you.   Measure and write down the amount. If there  are 2 bulbs, note the amount of drainage from bulb 1 or bulb 2 and keep the totals separate. Your caregiver will want to know which tube is draining more.   Compress the bulb by folding it in half.   Replace the stopper.   Check the tape that holds the tube to your skin, and pin the bulb to your shirt.  SEEK MEDICAL CARE IF:  The drainage develops a bad odor.   You have an oral temperature above 102 F (38.9 C).   The amount of drainage from your wound suddenly increases or decreases.   You accidentally pull out your drain.   You have any other questions or concerns.  MAKE  SURE YOU:   Understand these instructions.   Will watch your condition.   Will get help right away if you are not doing well or get worse.     Call our office if you have any questions about your drain. [phone]   Low-Fat Diet for Pancreatitis or Gallbladder Conditions A low-fat diet can be helpful if you have pancreatitis or a gallbladder condition. With these conditions, your pancreas and gallbladder have trouble digesting fats. A healthy eating plan with less fat will help rest your pancreas and gallbladder and reduce your symptoms. WHAT DO I NEED TO KNOW ABOUT THIS DIET?  Eat a low-fat diet.  Reduce your fat intake to less than 20-30% of your total daily calories. This is less than 50-60 g of fat per day.  Remember that you need some fat in your diet. Ask your dietician what your daily goal should be.  Choose nonfat and low-fat healthy foods. Look for the words "nonfat," "low fat," or "fat free."  As a guide, look on the label and choose foods with less than 3 g of fat per serving. Eat only one serving.  Avoid alcohol.  Do not smoke. If you need help quitting, talk with your health care provider.  Eat small frequent meals instead of three large heavy meals. WHAT FOODS CAN I EAT? Grains Include healthy grains and starches such as potatoes, wheat bread, fiber-rich cereal, and brown rice.  Choose whole grain options whenever possible. In adults, whole grains should account for 45-65% of your daily calories.  Fruits and Vegetables Eat plenty of fruits and vegetables. Fresh fruits and vegetables add fiber to your diet. Meats and Other Protein Sources Eat lean meat such as chicken and pork. Trim any fat off of meat before cooking it. Eggs, fish, and beans are other sources of protein. In adults, these foods should account for 10-35% of your daily calories. Dairy Choose low-fat milk and dairy options. Dairy includes fat and protein, as well as calcium.  Fats and Oils Limit high-fat foods such as fried foods, sweets, baked goods, sugary drinks.  Other Creamy sauces and condiments, such as mayonnaise, can add extra fat. Think about whether or not you need to use them, or use smaller amounts or low fat options. WHAT FOODS ARE NOT RECOMMENDED?  High fat foods, such as:  [employer].  Ice cream.  [place] toast.  Sweet rolls.  Pizza.  Cheese bread.  Foods covered with batter, butter, creamy sauces, or cheese.  Fried foods.  Sugary drinks and desserts.  Foods that cause gas or bloating   This information is not intended to replace advice given to you by your health care provider. Make sure you discuss any questions you have with your health care provider.   Document Released: 02/11/2013 Document Reviewed: 02/11/2013 Elsevier Interactive Patient Education [employer].  Exercising to [employer] Exercising regularly is important. It has many health benefits, such as:  Improving your overall fitness, flexibility, and endurance.  Increasing your bone density.  Helping with weight control.  Decreasing your body fat.  Increasing your muscle strength.  Reducing stress and tension.  Improving your overall health. In order to become healthy and stay healthy, it is recommended that you do moderate-intensity and vigorous-intensity exercise. You can tell that  you are exercising at a moderate intensity if you have a higher heart rate and faster breathing, but you are still able to hold a conversation. You can tell that you are exercising at a  vigorous intensity if you are breathing much harder and faster and cannot hold a conversation while exercising. HOW OFTEN SHOULD I EXERCISE? Choose an activity that you enjoy and set realistic goals. Your health care provider can help you to make an activity plan that works for you. Exercise regularly as directed by your health care provider. This may include:   Doing resistance training twice each week, such as:  Push-ups.  Sit-ups.  Lifting weights.  Using resistance bands.  Doing a given intensity of exercise for a given amount of time. Choose from these options:  150 minutes of moderate-intensity exercise every week.  75 minutes of vigorous-intensity exercise every week.  A mix of moderate-intensity and vigorous-intensity exercise every week. Children, pregnant women, people who are out of shape, people who are overweight, and older adults may need to consult a health care provider for individual recommendations. If you have any sort of medical condition, be sure to consult your health care provider before starting a new exercise program.  WHAT ARE SOME EXERCISE IDEAS? Some moderate-intensity exercise ideas include:   Walking at a rate of 1 mile in 15 minutes.  Biking.  Hiking.  Golfing.  Dancing. Some vigorous-intensity exercise ideas include:   Walking at a rate of at least 4.5 miles per hour.  Jogging or running at a rate of 5 miles per hour.  Biking at a rate of at least 10 miles per hour.  Lap swimming.  Roller-skating or in-line skating.  Cross-country skiing.  Vigorous competitive sports, such as football, basketball, and soccer.  Jumping rope.  Aerobic dancing. WHAT ARE SOME EVERYDAY ACTIVITIES THAT CAN HELP ME TO GET EXERCISE?  Yard work, such as:  Conservation officer, nature.  Raking and bagging leaves.  Washing and waxing your car.  Pushing a stroller.  Shoveling snow.  Gardening.  Washing windows or floors. HOW CAN I BE MORE ACTIVE IN MY DAY-TO-DAY ACTIVITIES?  Use the stairs instead of the elevator.  Take a walk during your lunch break.  If you drive, park your car farther away from work or school.  If you take public transportation, get off one stop early and walk the rest of the way.  Make all of your phone calls while standing up and walking around.  Get up, stretch, and walk around every 30 minutes throughout the day. WHAT GUIDELINES SHOULD I FOLLOW WHILE EXERCISING?  Do not exercise so much that you hurt yourself, feel dizzy, or get very short of breath.  Consult your health care provider before starting a new exercise program.  Wear comfortable clothes and shoes with good support.  Drink plenty of water while you exercise to prevent dehydration or heat stroke. Body water is lost during exercise and must be replaced.  Work out until you breathe faster and your heart beats faster.   This information is not intended to replace advice given to you by your health care provider. Make sure you discuss any questions you have with your health care provider.   Document Released: 03/11/2010 Document Revised: 02/27/2014 Document Reviewed: 07/10/2013 Elsevier Interactive Patient Education 2016 Elsevier Inc[employer]    Abscess An abscess is an infected area that contains a collection of pus and debris.It can occur in almost any part of the body. An abscess is also known as a furuncle or boil. CAUSES  An abscess occurs when tissue gets infected. This can occur from blockage of oil or sweat glands, infection of hair follicles, or a minor injury to  the skin. As the body tries to fight the infection, pus collects in the area and creates pressure under the skin. This pressure causes pain. People with weakened immune systems have difficulty fighting  infections and get certain abscesses more often.  SYMPTOMS Usually an abscess develops on the skin and becomes a painful mass that is red, warm, and tender. If the abscess forms under the skin, you may feel a moveable soft area under the skin. Some abscesses break open (rupture) on their own, but most will continue to get worse without care. The infection can spread deeper into the body and eventually into the bloodstream, causing you to feel ill.  DIAGNOSIS  Your caregiver will take your medical history and perform a physical exam. A sample of fluid may also be taken from the abscess to determine what is causing your infection. TREATMENT  Your caregiver may prescribe antibiotic medicines to fight the infection. However, taking antibiotics alone usually does not cure an abscess. Your caregiver may need to make a small cut (incision) in the abscess to drain the pus. In some cases, gauze is packed into the abscess to reduce pain and to continue draining the area. HOME CARE INSTRUCTIONS   Only take over-the-counter or prescription medicines for pain, discomfort, or fever as directed by your caregiver.  If you were prescribed antibiotics, take them as directed. Finish them even if you start to feel better.  If gauze is used, follow your caregiver's directions for changing the gauze.  To avoid spreading the infection:  Keep your draining abscess covered with a bandage.  Wash your hands well.  Do not share personal care items, towels, or whirlpools with others.  Avoid skin contact with others.  Keep your skin and clothes clean around the abscess.  Keep all follow-up appointments as directed by your caregiver. SEEK MEDICAL CARE IF:   You have increased pain, swelling, redness, fluid drainage, or bleeding.  You have muscle aches, chills, or a general ill feeling.  You have a fever. MAKE SURE YOU:   Understand these instructions.  Will watch your condition.  Will get help right away  if you are not doing well or get worse.   This information is not intended to replace advice given to you by your health care provider. Make sure you discuss any questions you have with your health care provider.   Document Released: 11/16/2004 Document Revised: 08/08/2011 Document Reviewed: 04/21/2011 Elsevier Interactive Patient Education 2016 [place]: POST OP INSTRUCTIONS (Surgery for small bowel obstruction, colon resection, etc)    DIET Follow a light diet the first few days at home.  Start with a bland diet such as soups, liquids, starchy foods, low fat foods, etc.  If you feel full, bloated, or constipated, stay on a ful liquid or pureed/blenderized diet for a few days until you feel better and no longer constipated. Be sure to drink plenty of fluids every day to avoid getting dehydrated (feeling dizzy, not urinating, etc.). Gradually add a fiber supplement to your diet over the next week.  Gradually get back to a regular solid diet.  Avoid fast food or heavy meals the first week as you are more likely to get nauseated. It is expected for your digestive tract to need a few months to get back to normal.  It is common for your bowel movements and stools to be irregular.  You will have occasional bloating and cramping that should eventually fade away.  Until you are  eating solid food normally, off all pain medications, and back to regular activities; your bowels will not be normal. Focus on eating a low-fat, high fiber diet the rest of your life (See Getting to [place], below).  CARE of your INCISION or WOUND It is good for closed incision and even open wounds to be washed every day.  Shower every day.  Short baths are fine.  Wash the incisions and wounds clean with soap & water.    If you have a closed incision(s), wash the incision with soap & water every day.  You may leave closed incisions open to air if it is dry.   You may cover the incision with clean gauze  & replace it after your daily shower for comfort. If you have skin tapes (Steristrips) or skin glue (Dermabond) on your incision, leave them in place.  They will fall off on their own like a scab.  You may trim any edges that curl up with clean scissors.  If you have staples, set up an appointment for them to be removed in the office in 10 days after surgery.  If you have a drain, wash around the skin exit site with soap & water and place a new dressing of gauze or band aid around the skin every day.  Keep the drain site clean & dry.    If you have an open wound with packing, see wound care instructions.  In general, it is encouraged that you remove your dressing and packing, shower with soap & water, and replace your dressing once a day.  Pack the wound with clean gauze moistened with normal (0.9%) saline to keep the wound moist & uninfected.  Pressure on the dressing for 30 minutes will stop most wound bleeding.  Eventually your body will heal & pull the open wound closed over the next few months.  Raw open wounds will occasionally bleed or secrete yellow drainage until it heals closed.  Drain sites will drain a little until the drain is removed.  Even closed incisions can have mild bleeding or drainage the first few days until the skin edges scab over & seal.   If you have an open wound with a wound vac, see wound vac care instructions.     ACTIVITIES as tolerated Start light daily activities --- self-care, walking, climbing stairs-- beginning the day after surgery.  Gradually increase activities as tolerated.  Control your pain to be active.  Stop when you are tired.  Ideally, walk several times a day, eventually an hour a day.   Most people are back to most day-to-day activities in a few weeks.  It takes 4-8 weeks to get back to unrestricted, intense activity. If you can walk 30 minutes without difficulty, it is safe to try more intense activity such as jogging, treadmill, bicycling, low-impact  aerobics, swimming, etc. Save the most intensive and strenuous activity for last (Usually 4-8 weeks after surgery) such as sit-ups, heavy lifting, contact sports, etc.  Refrain from any intense heavy lifting or straining until you are off narcotics for pain control.  You will have off days, but things should improve week-by-week. DO NOT PUSH THROUGH PAIN.  Let pain be your guide: If it hurts to do something, don't do it.  Pain is your body warning you to avoid that activity for another week until the pain goes down. You may drive when you are no longer taking narcotic prescription pain medication, you can comfortably wear a seatbelt,  and you can safely make sudden turns/stops to protect yourself without hesitating due to pain. You may have sexual intercourse when it is comfortable. If it hurts to do something, stop.  MEDICATIONS Take your usually prescribed home medications unless otherwise directed.   Blood thinners:  Usually you can restart any strong blood thinners after the second postoperative day.  It is OK to take aspirin right away.     If you are on strong blood thinners (warfarin/Coumadin, Plavix, Xerelto, Eliquis, Pradaxa, etc), discuss with your surgeon, medicine PCP, and/or cardiologist for instructions on when to restart the blood thinner & if blood monitoring is needed (PT/INR blood check, etc).     PAIN CONTROL Pain after surgery or related to activity is often due to strain/injury to muscle, tendon, nerves and/or incisions.  This pain is usually short-term and will improve in a few months.  To help speed the process of healing and to get back to regular activity more quickly, DO THE FOLLOWING THINGS TOGETHER: 1. Increase activity gradually.  DO NOT PUSH THROUGH PAIN 2. Use Ice and/or Heat 3. Try Gentle Massage and/or Stretching 4. Take over the counter pain medication 5. Take Narcotic prescription pain medication for more severe pain  Good pain control = faster recovery.  It is  better to take more medicine to be more active than to stay in bed all day to avoid medications. 1.  Increase activity gradually Avoid heavy lifting at first, then increase to lifting as tolerated over the next 6 weeks. Do not push through the pain.  Listen to your body and avoid positions and maneuvers than reproduce the pain.  Wait a few days before trying something more intense Walking an hour a day is encouraged to help your body recover faster and more safely.  Start slowly and stop when getting sore.  If you can walk 30 minutes without stopping or pain, you can try more intense activity (running, jogging, aerobics, cycling, swimming, treadmill, sex, sports, weightlifting, etc.) Remember: If it hurts to do it, then dont do it! 2. Use Ice and/or Heat You will have swelling and bruising around the incisions.  This will take several weeks to resolve. Ice packs or heating pads (6-8 times a day, 30-60 minutes at a time) will help sooth soreness & bruising. Some people prefer to use ice alone, heat alone, or alternate between ice & heat.  Experiment and see what works best for you.  Consider trying ice for the first few days to help decrease swelling and bruising; then, switch to heat to help relax sore spots and speed recovery. Shower every day.  Short baths are fine.  It feels good!  Keep the incisions and wounds clean with soap & water.   3. Try Gentle Massage and/or Stretching Massage at the area of pain many times a day Stop if you feel pain - do not overdo it 4. Take over the counter pain medication This helps the muscle and nerve tissues become less irritable and calm down faster Choose ONE of the following over-the-counter anti-inflammatory medications: Acetaminophen 500mg  tabs (Tylenol) 1-2 pills with every meal and just before bedtime (avoid if you have liver problems or if you have acetaminophen in you narcotic prescription) Naproxen 220mg  tabs (ex. Aleve, Naprosyn) 1-2 pills twice a  day (avoid if you have kidney, stomach, IBD, or bleeding problems) Ibuprofen 200mg  tabs (ex. Advil, Motrin) 3-4 pills with every meal and just before bedtime (avoid if you have kidney, stomach, IBD, or bleeding  problems) Take with food/snack several times a day as directed for at least 2 weeks to help keep pain / soreness down & more manageable. 5. Take Narcotic prescription pain medication for more severe pain A prescription for strong pain control is often given to you upon discharge (for example: oxycodone/Percocet, hydrocodone/Norco/Vicodin, or tramadol/Ultram) Take your pain medication as prescribed. Be mindful that most narcotic prescriptions contain Tylenol (acetaminophen) as well - avoid taking too much Tylenol. If you are having problems/concerns with the prescription medicine (does not control pain, nausea, vomiting, rash, itching, etc.), please call us [phone] to see if we need to switch you to a different pain medicine that will work better for you and/or control your side effects better. If you need a refill on your pain medication, you must call the office before 4 pm and on weekdays only.  By federal law, prescriptions for narcotics cannot be called into a pharmacy.  They must be filled out on paper & picked up from our office by the patient or authorized caretaker.  Prescriptions cannot be filled after 4 pm nor on weekends.   WHEN TO CALL us [phone] Severe uncontrolled or worsening pain  Fever over 101 F (38.5 C) Concerns with the incision: Worsening pain, redness, rash/hives, swelling, bleeding, or drainage Reactions / problems with new medications (itching, rash, hives, nausea, etc.) Nausea and/or vomiting Difficulty urinating Difficulty breathing Worsening fatigue, dizziness, lightheadedness, blurred vision Other concerns If you are not getting better after two weeks or are noticing you are getting worse, contact our office [phone] for further advice.  We  may need to adjust your medications, re-evaluate you in the office, send you to the emergency room, or see what other things we can do to help. The clinic staff is available to answer your questions during regular business hours (8:30am-5pm).  Please dont hesitate to call and ask to speak to one of our nurses for clinical concerns.    A surgeon from [facility] is always on call at the hospitals 24 hours/day If you have a medical emergency, go to the nearest emergency room or call 911. FOLLOW UP in our office One the day of your discharge from the hospital (or the next business weekday), please call [facility] to set up or confirm an appointment to see your surgeon in the office for a follow-up appointment.  Usually it is 2-3 weeks after your surgery.   If you have skin staples at your incision(s), let the office know so we can set up a time in the office for the nurse to remove them (usually around 10 days after surgery). Make sure that you call for appointments the day of discharge (or the next business weekday) from the hospital to ensure a convenient appointment time. IF YOU HAVE DISABILITY OR FAMILY LEAVE FORMS, BRING THEM TO THE OFFICE FOR PROCESSING.  DO NOT GIVE THEM TO YOUR DOCTOR.  [facility] [address] [place], [place], [place]  [address] ? [phone] - Main [phone] - [place],  [phone] - Fax [email or web address]  GETTING TO GOOD BOWEL HEALTH. It is expected for your digestive tract to need a few months to get back to normal.  It is common for your bowel movements and stools to be irregular.  You will have occasional bloating and cramping that should eventually fade away.  Until you are eating solid food normally, off all pain medications, and back to regular activities; your bowels  will not be normal.   Avoiding constipation The goal: ONE SOFT BOWEL MOVEMENT A DAY!    Drink plenty of fluids.  Choose  water first. TAKE A FIBER SUPPLEMENT EVERY DAY THE REST OF YOUR LIFE During your first week back home, gradually add back a fiber supplement every day Experiment which form you can tolerate.   There are many forms such as powders, tablets, wafers, gummies, etc Psyllium bran (Metamucil), methylcellulose (Citrucel), Miralax or Glycolax, Benefiber, Flax Seed.  Adjust the dose week-by-week (1/2 dose/day to 6 doses a day) until you are moving your bowels 1-2 times a day.  Cut back the dose or try a different fiber product if it is giving you problems such as diarrhea or bloating. Sometimes a laxative is needed to help jump-start bowels if constipated until the fiber supplement can help regulate your bowels.  If you are tolerating eating & you are farting, it is okay to try a gentle laxative such as double dose MiraLax, prune juice, or Milk of Magnesia.  Avoid using laxatives too often. Stool softeners can sometimes help counteract the constipating effects of narcotic pain medicines.  It can also cause diarrhea, so avoid using for too long. If you are still constipated despite taking fiber daily, eating solids, and a few doses of laxatives, call our office. Controlling diarrhea Try drinking liquids and eating bland foods for a few days to avoid stressing your intestines further. Avoid dairy products (especially milk & ice cream) for a short time.  The intestines often can lose the ability to digest lactose when stressed. Avoid foods that cause gassiness or bloating.  Typical foods include beans and other legumes, cabbage, broccoli, and dairy foods.  Avoid greasy, spicy, fast foods.  Every person has some sensitivity to other foods, so listen to your body and avoid those foods that trigger problems for you. Probiotics (such as active yogurt, Align, etc) may help repopulate the intestines and colon with normal bacteria and calm down a sensitive digestive tract Adding a fiber supplement gradually can help  thicken stools by absorbing excess fluid and retrain the intestines to act more normally.  Slowly increase the dose over a few weeks.  Too much fiber too soon can backfire and cause cramping & bloating. It is okay to try and slow down diarrhea with a few doses of antidiarrheal medicines.   Bismuth subsalicylate (ex. Kayopectate, Pepto Bismol) for a few doses can help control diarrhea.  Avoid if pregnant.   Loperamide (Imodium) can slow down diarrhea.  Start with one tablet (2mg ) first.  Avoid if you are having fevers or severe pain.  ILEOSTOMY PATIENTS WILL HAVE CHRONIC DIARRHEA since their colon is not in use.    Drink plenty of liquids.  You will need to drink even more glasses of water/liquid a day to avoid getting dehydrated. Record output from your ileostomy.  Expect to empty the bag every 3-4 hours at first.  Most people with a permanent ileostomy empty their bag 4-6 times at the least.   Use antidiarrheal medicine (especially Imodium) several times a day to avoid getting dehydrated.  Start with a dose at bedtime & breakfast.  Adjust up or down as needed.  Increase antidiarrheal medications as directed to avoid emptying the bag more than 8 times a day (every 3 hours). Work with your wound ostomy nurse to learn care for your ostomy.  See ostomy care instructions. TROUBLESHOOTING IRREGULAR BOWELS 1) Start with a soft & bland diet. No spicy, greasy,  or fried foods.  2) Avoid gluten/wheat or dairy products from diet to see if symptoms improve. 3) Miralax 17gm or flax seed mixed in Rew. water or juice-daily. May use 2-4 times a day as needed. 4) Gas-X, Phazyme, etc. as needed for gas & bloating.  5) Prilosec (omeprazole) over-the-counter as needed 6)  Consider probiotics (Align, Activa, etc) to help calm the bowels down  Call your doctor if you are getting worse or not getting better.  Sometimes further testing (cultures, endoscopy, X-ray studies, CT scans, bloodwork, etc.) may be needed to help  diagnose and treat the cause of the diarrhea. [facility], [place], [place], [place], [place]  [address] [phone] - Main.    [phone]  - Toll Free.   [phone] - Fax [email or web address]  GETTING TO GOOD BOWEL HEALTH. Irregular bowel habits such as constipation and diarrhea can lead to many problems over time.  Having one soft bowel movement a day is the most important way to prevent further problems.  The anorectal canal is designed to handle stretching and feces to safely manage our ability to get rid of solid waste (feces, poop, stool) out of our body.  BUT, hard constipated stools can act like ripping concrete bricks and diarrhea can be a burning fire to this very sensitive area of our body, causing inflamed hemorrhoids, anal fissures, increasing risk is perirectal abscesses, abdominal pain/bloating, an making irritable bowel worse.      The goal: ONE SOFT BOWEL MOVEMENT A DAY!  To have soft, regular bowel movements:   Drink plenty of fluids, consider 4-6 tall glasses of water a day.    Take plenty of fiber.  Fiber is the undigested part of plant food that passes into the colon, acting s natures broom to encourage bowel motility and movement.  Fiber can absorb and hold large amounts of water. This results in a larger, bulkier stool, which is soft and easier to pass. Work gradually over several weeks up to 6 servings a day of fiber (25g a day even more if needed) in the form of: o Vegetables -- Root (potatoes, carrots, turnips), leafy green (lettuce, salad greens, celery, spinach), or cooked high residue (cabbage, broccoli, etc) o Fruit -- Fresh (unpeeled skin & pulp), Dried (prunes, apricots, cherries, etc ),  or stewed ( applesauce)  o Whole grain breads, pasta, etc (whole wheat)  o Bran cereals   Bulking Agents -- This type of water-retaining fiber generally is easily obtained each day by one of the following:  o Psyllium bran --  The psyllium plant is remarkable because its ground seeds can retain so much water. This product is available as Metamucil, Konsyl, Effersyllium, Per Diem Fiber, or the less expensive generic preparation in drug and health food stores. Although labeled a laxative, it really is not a laxative.  o Methylcellulose -- This is another fiber derived from wood which also retains water. It is available as Citrucel. o Polyethylene Glycol - and artificial fiber commonly called Miralax or Glycolax.  It is helpful for people with gassy or bloated feelings with regular fiber o Flax Seed - a less gassy fiber than psyllium  No reading or other relaxing activity while on the toilet. If bowel movements take longer than 5 minutes, you are too constipated  AVOID CONSTIPATION.  High fiber and water intake usually takes care of this.  Sometimes a laxative is needed to stimulate more frequent bowel movements, but   Laxatives are not  a good long-term solution as it can wear the colon out.  They can help jump-start bowels if constipated, but should be relied on constantly without discussing with your doctor o Osmotics (Milk of Magnesia, Fleets phosphosoda, Magnesium citrate, MiraLax, GoLytely) are safer than  o Stimulants (Senokot, Castor Oil, Dulcolax, Ex Lax)    o Avoid taking laxatives for more than 7 days in a row.   IF SEVERELY CONSTIPATED, try a Bowel Retraining Program: o Do not use laxatives.  o Eat a diet high in roughage, such as bran cereals and leafy vegetables.  o Drink six (6) ounces of prune or apricot juice each morning.  o Eat two (2) large servings of stewed fruit each day.  o Take one (1) heaping tablespoon of a psyllium-based bulking agent twice a day. Use sugar-free sweetener when possible to avoid excessive calories.  o Eat a normal breakfast.  o Set aside 15 minutes after breakfast to sit on the toilet, but do not strain to have a bowel movement.  o If you do not have a bowel movement by the  third day, use an enema and repeat the above steps.   Controlling diarrhea o Switch to liquids and simpler foods for a few days to avoid stressing your intestines further. o Avoid dairy products (especially milk & ice cream) for a short time.  The intestines often can lose the ability to digest lactose when stressed. o Avoid foods that cause gassiness or bloating.  Typical foods include beans and other legumes, cabbage, broccoli, and dairy foods.  Every person has some sensitivity to other foods, so listen to our body and avoid those foods that trigger problems for you. o Adding fiber (Citrucel, Metamucil, psyllium, Miralax) gradually can help thicken stools by absorbing excess fluid and retrain the intestines to act more normally.  Slowly increase the dose over a few weeks.  Too much fiber too soon can backfire and cause cramping & bloating. o Probiotics (such as active yogurt, Align, etc) may help repopulate the intestines and colon with normal bacteria and calm down a sensitive digestive tract.  Most studies show it to be of mild help, though, and such products can be costly. o Medicines: - Bismuth subsalicylate (ex. Kayopectate, Pepto Bismol) every 30 minutes for up to 6 doses can help control diarrhea.  Avoid if pregnant. - Loperamide (Immodium) can slow down diarrhea.  Start with two tablets (4mg  total) first and then try one tablet every 6 hours.  Avoid if you are having fevers or severe pain.  If you are not better or start feeling worse, stop all medicines and call your doctor for advice o Call your doctor if you are getting worse or not better.  Sometimes further testing (cultures, endoscopy, X-ray studies, bloodwork, etc) may be needed to help diagnose and treat the cause of the diarrhea.  TROUBLESHOOTING IRREGULAR BOWELS 1) Avoid extremes of bowel movements (no bad constipation/diarrhea) 2) Miralax 17gm mixed in 8oz. water or juice-daily. May use BID as needed.  3) Gas-x,Phazyme, etc. as  needed for gas & bloating.  4) Soft,bland diet. No spicy,greasy,fried foods.  5) Prilosec over-the-counter as needed  6) May hold gluten/wheat products from diet to see if symptoms improve.  7)  May try probiotics (Align, Activa, etc) to help calm the bowels down 7) If symptoms become worse call back immediately.  Managing Pain  Pain after surgery or related to activity is often due to strain/injury to muscle, tendon, nerves and/or incisions.  This  pain is usually short-term and will improve in a few months.   Many people find it helpful to do the following things TOGETHER to help speed the process of healing and to get back to regular activity more quickly:  1. Avoid heavy physical activity at first a. No lifting greater than 20 pounds at first, then increase to lifting as tolerated over the next few weeks b. Do not push through the pain.  Listen to your body and avoid positions and maneuvers than reproduce the pain.  Wait a few days before trying something more intense c. Walking is okay as tolerated, but go slowly and stop when getting sore.  If you can walk 30 minutes without stopping or pain, you can try more intense activity (running, jogging, aerobics, cycling, swimming, treadmill, sex, sports, weightlifting, etc ) d. Remember: If it hurts to do it, then dont do it!  2. Take Anti-inflammatory medication a. Choose ONE of the following over-the-counter medications: i.            Acetaminophen 500mg  tabs (Tylenol) 1-2 pills with every meal and just before bedtime (avoid if you have liver problems) ii.            Naproxen 220mg  tabs (ex. Aleve) 1-2 pills twice a day (avoid if you have kidney, stomach, IBD, or bleeding problems) iii. Ibuprofen 200mg  tabs (ex. Advil, Motrin) 3-4 pills with every meal and just before bedtime (avoid if you have kidney, stomach, IBD, or bleeding problems) b. Take with food/snack around the clock for 1-2 weeks i. This helps the muscle and nerve tissues  become less irritable and calm down faster  3. Use a Heating pad or Ice/Cold Pack a. 4-6 times a day b. May use warm bath/hottub  or showers  4. Try Gentle Massage and/or Stretching  a. at the area of pain many times a day b. stop if you feel pain - do not overdo it  Try these steps together to help you body heal faster and avoid making things get worse.  Doing just one of these things may not be enough.    If you are not getting better after two weeks or are noticing you are getting worse, contact our office for further advice; we may need to re-evaluate you & see what other things we can do to help.

## 2015-04-06 NOTE — Progress Notes (Signed)
Physical Therapy Treatment Patient Details Name: Derek BRAHMBHATT Sr. 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 H/o B TKA, HTN, DM, Meniere's disease (with falls), obesity, anxiety    PT Comments    Pt feeling better.  Orthostatic's taken:  Supine 116/55(69), EOB 130/98(107), standing 121/58(88) Pt tolerated amb increased distance.  Doing much better and eager to D/C to SNF for ST Rehab.   Follow Up Recommendations  SNF     Equipment Recommendations       Recommendations for Other Services       Precautions / Restrictions Precautions Precautions: Fall Precaution Comments: L drains Restrictions Weight Bearing Restrictions: No RLE Weight Bearing: Weight bearing as tolerated    Mobility  Bed Mobility Overal bed mobility: Needs Assistance Bed Mobility: Supine to Sit     Supine to sit: Min assist     General bed mobility comments: increased time with use of rail  Transfers Overall transfer level: Needs assistance Equipment used: Rolling walker (2 wheeled) Transfers: Sit to/from Stand Sit to Stand: Min guard         General transfer comment: increased ability to rise  Ambulation/Gait Ambulation/Gait assistance: Min guard;Min assist Ambulation Distance (Feet): 450 Feet Assistive device: Rolling walker (2 wheeled) Gait Pattern/deviations: Step-to pattern;Decreased step length - right;Decreased step length - left;Trunk flexed Gait velocity: decreased   General Gait Details: tolerated increased distance.  Slow gait.  Required one sitting rest break.    Stairs            Wheelchair Mobility    Modified Rankin (Stroke Patients Only)       Balance                                    Cognition Arousal/Alertness: Awake/alert Behavior During Therapy: WFL for tasks assessed/performed Overall Cognitive Status: Within  Functional Limits for tasks assessed                      Exercises      General Comments        Pertinent Vitals/Pain Pain Assessment: No/denies pain    Home Living                      Prior Function            PT Goals (current goals can now be found in the care plan section) Progress towards PT goals: Progressing toward goals    Frequency  Min 3X/week    PT Plan Current plan remains appropriate    Co-evaluation             End of Session Equipment Utilized During Treatment: Gait belt Activity Tolerance: Patient tolerated treatment well Patient left: in chair;with call bell/phone within reach     Time: 1110-1150 PT Time Calculation (min) (ACUTE ONLY): 40 min  Charges:  $Gait Training: 23-37 mins $Therapeutic Activity: 8-22 mins                    G Codes:      Derek [name]  PTA [facility]  Acute  Rehab Pager      [phone]

## 2015-04-06 NOTE — Consult Note (Signed)
   [facility] CM Inpatient Consult   04/06/2015  Derek [name] Sr. [birth year] [id number]   Patient remains in house. [facility] Care Management to follow up at SNF. He was recently active with [facility] Telephonic program. Consents had been signed by the wife. Spoke with Derek [name] to remind him that [facility] will follow up post hospital discharge. He remains agreeable to [facility] program services. He reports that he may discharge today. Will refer for [facility] Licensed CSW upon discharge. Spoke with inpatient RNCM.   Marthenia Rolling, MSN-Ed, RN,BSN [facility] Liaison [phone]

## 2015-04-07 ENCOUNTER — Other Ambulatory Visit: Payer: Self-pay | Admitting: Surgery

## 2015-04-07 DIAGNOSIS — D6851 Activated protein C resistance: Secondary | ICD-10-CM | POA: Diagnosis not present

## 2015-04-07 DIAGNOSIS — T814XXD Infection following a procedure, subsequent encounter: Principal | ICD-10-CM

## 2015-04-07 DIAGNOSIS — J634 Siderosis: Secondary | ICD-10-CM | POA: Diagnosis not present

## 2015-04-07 DIAGNOSIS — I10 Essential (primary) hypertension: Secondary | ICD-10-CM | POA: Diagnosis not present

## 2015-04-07 DIAGNOSIS — K651 Peritoneal abscess: Principal | ICD-10-CM

## 2015-04-07 DIAGNOSIS — IMO0001 Reserved for inherently not codable concepts without codable children: Secondary | ICD-10-CM

## 2015-04-07 DIAGNOSIS — E43 Unspecified severe protein-calorie malnutrition: Secondary | ICD-10-CM | POA: Diagnosis not present

## 2015-04-07 DIAGNOSIS — K219 Gastro-esophageal reflux disease without esophagitis: Secondary | ICD-10-CM | POA: Diagnosis not present

## 2015-04-07 DIAGNOSIS — J918 Pleural effusion in other conditions classified elsewhere: Secondary | ICD-10-CM | POA: Diagnosis not present

## 2015-04-07 DIAGNOSIS — N183 Chronic kidney disease, stage 3 (moderate): Secondary | ICD-10-CM | POA: Diagnosis not present

## 2015-04-07 DIAGNOSIS — E119 Type 2 diabetes mellitus without complications: Secondary | ICD-10-CM | POA: Diagnosis not present

## 2015-04-07 DIAGNOSIS — D538 Other specified nutritional anemias: Secondary | ICD-10-CM | POA: Diagnosis not present

## 2015-04-08 DIAGNOSIS — R1909 Other intra-abdominal and pelvic swelling, mass and lump: Secondary | ICD-10-CM | POA: Diagnosis not present

## 2015-04-08 DIAGNOSIS — I10 Essential (primary) hypertension: Secondary | ICD-10-CM | POA: Diagnosis not present

## 2015-04-08 DIAGNOSIS — E119 Type 2 diabetes mellitus without complications: Secondary | ICD-10-CM | POA: Diagnosis not present

## 2015-04-08 DIAGNOSIS — K651 Peritoneal abscess: Secondary | ICD-10-CM | POA: Diagnosis not present

## 2015-04-09 ENCOUNTER — Ambulatory Visit: Payer: PPO | Admitting: *Deleted

## 2015-04-12 DIAGNOSIS — R1909 Other intra-abdominal and pelvic swelling, mass and lump: Secondary | ICD-10-CM | POA: Diagnosis not present

## 2015-04-12 DIAGNOSIS — K651 Peritoneal abscess: Secondary | ICD-10-CM | POA: Diagnosis not present

## 2015-04-12 DIAGNOSIS — I10 Essential (primary) hypertension: Secondary | ICD-10-CM | POA: Diagnosis not present

## 2015-04-12 DIAGNOSIS — E119 Type 2 diabetes mellitus without complications: Secondary | ICD-10-CM | POA: Diagnosis not present

## 2015-04-16 ENCOUNTER — Other Ambulatory Visit: Payer: Self-pay | Admitting: *Deleted

## 2015-04-16 ENCOUNTER — Encounter: Payer: Self-pay | Admitting: *Deleted

## 2015-04-16 DIAGNOSIS — R1909 Other intra-abdominal and pelvic swelling, mass and lump: Secondary | ICD-10-CM | POA: Diagnosis not present

## 2015-04-16 DIAGNOSIS — K219 Gastro-esophageal reflux disease without esophagitis: Secondary | ICD-10-CM | POA: Diagnosis not present

## 2015-04-16 DIAGNOSIS — E119 Type 2 diabetes mellitus without complications: Secondary | ICD-10-CM | POA: Diagnosis not present

## 2015-04-16 DIAGNOSIS — I10 Essential (primary) hypertension: Secondary | ICD-10-CM | POA: Diagnosis not present

## 2015-04-16 MED FILL — NovoLOG 100 UNIT/ML SOLN: 100 | 28 days supply | Qty: 10 | Fill #1

## 2015-04-16 MED FILL — LANTUS 100 UNITS/ML VIAL: 100 | 28 days supply | Qty: 10 | Fill #1

## 2015-04-16 NOTE — Patient Outreach (Signed)
[facility]) Care Management  04/16/2015  Derek RENWICK Sr. 1946/01/27 [id number]   CSW was able to make contact with patient today to perform the initial assessment, as well as assess and assist with social work needs and services.  CSW met with patient at [facility] where patient was residing to receive short-term rehabilitative services.  CSW was able to attend patient's Discharge Planning Meeting.  CSW introduced self, explained role and types of services provided through [facility] ([facility]).  CSW further explained to patient that CSW works with patient's RNCM, also with [facility], Raina Mina. CSW then explained the reason for the visit, indicating that Ms. Zigmund Daniel thought that patient would benefit from social work services and resources to assist with possible discharge planning needs and services.  CSW obtained two HIPAA compliant identifiers from patient, which included patient's name and date of birth. Patient was a bit surprised when he learned that he would be going home today, as this an unexpected discharge plan for his and his wife, Derek [name].  Patient was a little more comfortable about his discharge plans, once he learned that home health services and durable medical equipment had already been arranged for him by his therapists at the facility.  Patient denies having any social work needs at present.  CSW provided patient with CSW's contact information, encouraging patient to contact CSW directly if social work needs arise in the near future.  Patient voiced understanding and was agreeable to this plan. CSW will perform a case closure on patient, as all goals of treatment have been met from social work standpoint and no additional social work needs have been identified at this time. CSW will notify patient's RNCM with [facility], Raina Mina of CSW's plans to close patient's case. CSW will fax a barriers letter, as well as a correspondence letter to patient's Primary Care Physician, Dr. Lona Kettle.   CSW will submit a case closure request to Lurline Del, Care Management Assistant with [facility], in the form of an [employer].  CSW will ensure that Mrs. Laurance Flatten is aware of Raina Mina, RNCM with [facility], continued involvement with patient's care.  Nat Christen, BSW, MSW, LCSW  Licensed Education officer, [facility]  [address] [place] [address] [place], [place] [address] Physical Address-[address], [place], [place] [address] Toll Free Main # [phone] Fax # [phone] Cell # [phone]  Fax # [phone]  [email or web address]

## 2015-04-17 DIAGNOSIS — Z9181 History of falling: Secondary | ICD-10-CM | POA: Diagnosis not present

## 2015-04-17 DIAGNOSIS — I10 Essential (primary) hypertension: Secondary | ICD-10-CM | POA: Diagnosis not present

## 2015-04-17 DIAGNOSIS — Z794 Long term (current) use of insulin: Secondary | ICD-10-CM | POA: Diagnosis not present

## 2015-04-17 DIAGNOSIS — E119 Type 2 diabetes mellitus without complications: Secondary | ICD-10-CM | POA: Diagnosis not present

## 2015-04-17 DIAGNOSIS — Z8507 Personal history of malignant neoplasm of pancreas: Secondary | ICD-10-CM | POA: Diagnosis not present

## 2015-04-17 DIAGNOSIS — G4733 Obstructive sleep apnea (adult) (pediatric): Secondary | ICD-10-CM | POA: Diagnosis not present

## 2015-04-17 DIAGNOSIS — M150 Primary generalized (osteo)arthritis: Secondary | ICD-10-CM | POA: Diagnosis not present

## 2015-04-17 DIAGNOSIS — R2689 Other abnormalities of gait and mobility: Secondary | ICD-10-CM | POA: Diagnosis not present

## 2015-04-17 DIAGNOSIS — T814XXA Infection following a procedure, initial encounter: Secondary | ICD-10-CM | POA: Diagnosis not present

## 2015-04-17 DIAGNOSIS — Z4889 Encounter for other specified surgical aftercare: Secondary | ICD-10-CM | POA: Diagnosis not present

## 2015-04-19 ENCOUNTER — Other Ambulatory Visit: Payer: Self-pay | Admitting: *Deleted

## 2015-04-19 DIAGNOSIS — Z794 Long term (current) use of insulin: Secondary | ICD-10-CM | POA: Diagnosis not present

## 2015-04-19 DIAGNOSIS — G4733 Obstructive sleep apnea (adult) (pediatric): Secondary | ICD-10-CM | POA: Diagnosis not present

## 2015-04-19 DIAGNOSIS — Z4889 Encounter for other specified surgical aftercare: Secondary | ICD-10-CM | POA: Diagnosis not present

## 2015-04-19 DIAGNOSIS — M150 Primary generalized (osteo)arthritis: Secondary | ICD-10-CM | POA: Diagnosis not present

## 2015-04-19 DIAGNOSIS — E119 Type 2 diabetes mellitus without complications: Secondary | ICD-10-CM | POA: Diagnosis not present

## 2015-04-19 DIAGNOSIS — I10 Essential (primary) hypertension: Secondary | ICD-10-CM | POA: Diagnosis not present

## 2015-04-19 DIAGNOSIS — Z9181 History of falling: Secondary | ICD-10-CM | POA: Diagnosis not present

## 2015-04-19 DIAGNOSIS — T814XXA Infection following a procedure, initial encounter: Secondary | ICD-10-CM | POA: Diagnosis not present

## 2015-04-19 DIAGNOSIS — Z8507 Personal history of malignant neoplasm of pancreas: Secondary | ICD-10-CM | POA: Diagnosis not present

## 2015-04-19 NOTE — Patient Outreach (Addendum)
[facility]) Care Management  04/19/2015  Derek CHARACTER Sr. April 24, 1945 [id number]   Transition of care (week 1) from SNF  Rn attempted the initial outreach call to pt post discharge form the SNF however unsuccessful. RN able to  leave a HIPAA approved voice message requesting a call back for services. Will continue outreach calls accordingly.  Raina Mina, RN Care Management Coordinator [place] Office [phone]

## 2015-04-20 ENCOUNTER — Ambulatory Visit
Admission: RE | Admit: 2015-04-20 | Discharge: 2015-04-20 | Disposition: A | Discharge status: Home / Self Care | Payer: PPO | Source: Ambulatory Visit | Attending: Radiology | Admitting: Radiology

## 2015-04-20 ENCOUNTER — Ambulatory Visit
Admission: RE | Admit: 2015-04-20 | Discharge: 2015-04-20 | Disposition: A | Discharge status: Home / Self Care | Payer: PPO | Source: Ambulatory Visit | Attending: Surgery | Admitting: Surgery

## 2015-04-20 DIAGNOSIS — IMO0001 Reserved for inherently not codable concepts without codable children: Secondary | ICD-10-CM

## 2015-04-20 DIAGNOSIS — K651 Peritoneal abscess: Secondary | ICD-10-CM | POA: Diagnosis not present

## 2015-04-20 DIAGNOSIS — T814XXD Infection following a procedure, subsequent encounter: Principal | ICD-10-CM

## 2015-04-20 DIAGNOSIS — J90 Pleural effusion, not elsewhere classified: Secondary | ICD-10-CM | POA: Diagnosis not present

## 2015-04-20 DIAGNOSIS — K6819 Other retroperitoneal abscess: Secondary | ICD-10-CM | POA: Diagnosis not present

## 2015-04-20 HISTORY — PX: IR GENERIC HISTORICAL: IMG1180011

## 2015-04-20 MED ORDER — IOPAMIDOL (ISOVUE-300) INJECTION 61%
125.0000 mL | Freq: Once | INTRAVENOUS | Status: AC | PRN
  Administered 2015-04-20: 125 mL via INTRAVENOUS

## 2015-04-20 NOTE — Progress Notes (Signed)
Chief Complaint: Patient was seen in consultation today for follow up pancreatic fluid collection at the request of Dr. Armandina Gemma  Referring Physician(s): Dr. Sherren Mocha gerkin  History of Present Illness: Derek CRUPI Sr. is a 70 y.o. 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 He developed peripancreatic fluid collections requiring percutaneous drainage as well as a left sided pleural effusion. Most recently, on 03/31/15, he underwent placement of a left pleural drain, which was subsequently removed on 2/14, as well as repositioning and upsizing of the left anterior abdominal collection drain. The original drain to the pancreatic stump bed drain placed 03/20/15 remains intact. He's been doing pretty well, good appetite, actually gaining weight. They've been doing a great job of flushing each drain daily and recording the output. Hasn't seen much output from the smaller posterior drain, but continued purulent output from the more anterior abdominal drain. No recent fevers. He's here today for follow up CT scan and evaluation.  Past Medical History  Diagnosis Date  . Hypertension   . Diabetes mellitus   . GERD (gastroesophageal reflux disease)   . Meniere's disease   . Morbid obesity ([place])   . Depression   . Arthritis   . 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 Anxiety     prone to "panic attacks"  . 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 History of kidney stones     multiple kidney stones-not a problem at present  . Kidney stone   . Dermatitis     bilateral hands  . BPH (benign prostatic hyperplasia)   . Shortness of breath dyspnea   . 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 DDD (degenerative disc disease), lumbar     gets back injections   . Neuroendocrine tumor of pancreas ([place])   . Straining on urination   . Obstructive sleep apnea     " i used to  have sleep apnea" never used a c-pap   . Cancer [facility])     newly diagnosed pancreatic cancer    Past Surgical History  Procedure Laterality Date  . Knee arthroscopy Right 2002    rt knee  . Rotator cuff repair  '93bilateral '94 lt    x3  . Umbilical hernia repair  2000  . Knee arthroscopy  03/01/2012    Procedure: ARTHROSCOPY KNEE;  Surgeon: Gearlean Alf, MD;  Location: [facility];  Service: Orthopedics;  Laterality: Left;  WITH SYNOVECTOMY   . Ankle surgery Left 2010    left-fx- some retained hardware as of 05-22-13  . Hardware removal Left 2013    ankle-lt  . Anal fistulectomy N/A 04/01/2013    Procedure: EXAM UNDER ANESTHESIA WITH ANAL FISSURectomy, sphincterotomy and internal hemorrhoidectomy;  Surgeon: Harl Bowie, MD;  Location: [facility];  Service: General;  Laterality: N/A;  . Replacement total knee Left 2009    knee-left  . Total knee arthroplasty Right 06/02/2013    Procedure: RIGHT TOTAL KNEE ARTHROPLASTY;  Surgeon: Gearlean Alf, MD;  Location: [facility];  Service: Orthopedics;  Laterality: Right;  . Colonoscopy    . Upper gastrointestinal endoscopy  sept 2016  . Inguinal hernia repair  '74,'82    x 2, inguinal  . Eus N/A 12/31/2014    Procedure: UPPER ENDOSCOPIC ULTRASOUND (EUS) LINEAR;  Surgeon: Milus Banister, MD;  Location: [facility];  Service: Endoscopy;  Laterality: N/A;    Allergies: Morphine and related; Quinine; Voltaren; and Oxycontin  Medications: Prior to Admission medications   Medication Sig Start Date End Date Taking? Authorizing Provider  aspirin EC 81 MG tablet Take 81 mg by mouth at bedtime.    Historical Provider, MD  carvedilol (COREG) 25 MG tablet Take 0.5 tablets (12.5 mg total) by mouth 2 (two) times daily with a meal. 04/06/15   Michael Boston, MD  clobetasol ointment (TEMOVATE) [id number] % Apply 1 application topically daily as needed (for peeling skin on hands and feet). Reported on 03/17/2015    Historical  Provider, MD  [employer] TRIPLE MAGNESIUM COMPLEX PO Take 1-2 capsules by mouth 2 (two) times daily. Two capsules in the morning and one at night    Historical Provider, MD  diphenhydrAMINE (BENADRYL) 25 mg capsule Take 50 mg by mouth 2 (two) times daily. Reported on 03/17/2015    Historical Provider, MD  HYDROmorphone (DILAUDID) 2 MG tablet Take 1 tablet (2 mg total) by mouth every 3 (three) hours as needed for moderate pain or severe pain. 03/03/15   Armandina Gemma, MD  HYDROmorphone (DILAUDID) 2 MG tablet Take 1 tablet (2 mg total) by mouth every 4 (four) hours as needed for moderate pain or severe pain. 04/05/15   Armandina Gemma, MD  insulin aspart (NOVOLOG) 100 UNIT/ML injection Inject 0-9 Units into the skin 3 (three) times daily before meals. 03/03/15   Armandina Gemma, MD  insulin glargine (LANTUS) 100 UNIT/ML injection Inject 0.2 mLs (20 Units total) into the skin daily. 03/03/15   Armandina Gemma, MD  Insulin Syringes, Disposable, U-100 0.5 ML MISC 20 Units by Does not apply route daily. 03/03/15   Armandina Gemma, MD  lansoprazole (PREVACID) 15 MG capsule Take 30 mg by mouth daily as needed (reflux). Reported on 03/17/2015    Historical Provider, MD  naproxen sodium (ALEVE) 220 MG tablet Take 440 mg by mouth 2 (two) times daily with a meal.     Historical Provider, MD  [facility] DELICA LANCETS [id number] MISC 1 Device by Does not apply route 4 (four) times daily. 03/17/15   Renato Shin, MD     Family History  Problem Relation Age of Onset  . Factor V Leiden deficiency Daughter   . Diabetes Maternal Grandmother   . Heart disease Paternal Uncle   . Bone cancer Father     Jaw  . Colon cancer Neg Hx   . Esophageal cancer Neg Hx   . Stomach cancer Neg Hx   . Rectal cancer Neg Hx     Social History   Social History  . Marital Status: Married    Spouse Name: N/A  . Number of Children: 2  . Years of Education: N/A   Occupational History  . retired     Social History Main Topics  . Smoking status: Former Smoker --  4.00 packs/day for 17 years    Types: Cigarettes    Quit date: 02/21/1975  . Smokeless tobacco: Never Used  . Alcohol Use: No  . Drug Use: No  . Sexual Activity: Yes   Other Topics Concern  . Not on file   Social History Narrative   Married, retired Scientist, product/process development   1 son 1 daughter   2 caffeine drinks/day   10/18/2014       Review of Systems: A 12 point ROS discussed and pertinent positives are indicated in the HPI above.  All other systems are negative.  Review of Systems  Vital Signs: BP 141/84 mmHg  Pulse 64  Temp(Src) 98.4 F (36.9 C) (Oral)  SpO2 100%  Physical Exam Abd: soft, NT (L)pancreatic stump drain(lateral-superior location) intact, site clean, NT. Scant cloudy output. Flushed with a few mL NS, minimal return (L)anterior abd drain intact, site clean, NT. Output thick purulent/some debris. Flushed easily with about 82mL NS, return of cloudy/purulent. New bag and bulb applied to each, as well as new Stat-Locks to secure drains in position.  Imaging:  Ct Abdomen Pelvis W Contrast  04/20/2015  CLINICAL DATA:  Status post percutaneous drainage of abdominal abscesses/pseudocysts post distal pancreatectomy and splenectomy for neuroendocrine tumor of pancreas. EXAM: CT ABDOMEN AND PELVIS WITH CONTRAST TECHNIQUE: Multidetector CT imaging of the abdomen and pelvis was performed using the standard protocol following bolus administration of intravenous contrast. CONTRAST:  157mL ISOVUE-300 IOPAMIDOL (ISOVUE-300) INJECTION 61% COMPARISON:  None. FINDINGS: There remains a collection containing complex fluid and air at the level of the more anterior of 2 indwelling left-sided percutaneous drainage catheters. This region measures approximately 7.8 x 5.9 x 10.8 cm and likely represents an evolving pseudocyst. This appears to be maturing and more well marginated compared to previous imaging, especially just prior to percutaneous drainage. The more posterior drain continues to extend to  the region of the surgical pancreatic stump and there is no fluid around this catheter. Pleural thickening and scarring noted at the left lung base without evidence of recurrent loculated left pleural fluid. Hiatal hernia appears stable. No evidence of bowel obstruction. No free air is identified. No new fluid collections. The liver, gallbladder, remaining pancreas, adrenal glands and kidneys are normal. Stable right-sided bladder diverticulum. IMPRESSION: 1. Residual marginated pseudocyst remains at the level of the more anterior percutaneous drain. This now appears to be maturing compared to the imaging obtained around the time of percutaneous drainage. 2. No residual fluid is seen at the level of the pancreatic stump drain. 3. No residual pleural fluid at the left lung base. Electronically Signed   By: Aletta Edouard M.D.   On: 04/20/2015 10:04    Labs:  CBC:  Recent Labs  03/24/15 0330 03/26/15 0508 03/30/15 0529 04/05/15 1155  WBC 15.0* 13.2* 11.5* 7.9  HGB 10.1* 9.4* 8.4* 7.8*  HCT 33.2* 30.8* 27.0* 25.3*  PLT 992* 936* 950* 812*    COAGS:  Recent Labs  02/19/15 0920 03/18/15 1859  INR 1.07 1.33  APTT 28  --     BMP:  Recent Labs  03/29/15 0456 04/01/15 0457 04/05/15 1155 04/06/15 0528  NA 133* 135 135 138  K 3.7 3.1* 3.0* 3.4*  CL 100* 101 100* 103  CO2 23 24 27 27   GLUCOSE 137* 124* 215* 105*  BUN 11 11 14 13   CALCIUM 9.8 9.5 9.3 9.5  CREATININE 1.04 1.03 1.20 1.25*  GFRNONAA >60 >60 60* 57*  GFRAA >60 >60 >60 >60    LIVER FUNCTION TESTS:  Recent Labs  02/19/15 0920 03/18/15 1859 03/23/15 0340 04/05/15 1155  BILITOT 0.6 0.7 0.9 0.6  AST 19 34 29 19  ALT 23 31 23  12*  ALKPHOS 83 147* 155* 97  PROT 6.9 7.1 6.5 5.8*  ALBUMIN 4.2 2.7* 2.2* 2.0*    TUMOR MARKERS: No results for input(s): AFPTM, CEA, CA199, CHROMGRNA in the last 8760 hours.  Assessment and Plan: CT reviewed and results discussed with pt and wife Left anterior abdominal fluid  collection/pseudocyst remains, drain in good position. Left pancreatic stump collection essentially resolved.  Will stop flushing posterior drain and monitor output. Will check in with pt early next week, if minimal output, will bring pt in to remove that drain. Continue daily flush of pseudocyst drain and record output. No need for exchange/manipulation at this time.  Thank you for this interesting consult.  I greatly enjoyed meeting Derek KNEISLEY Sr. and look forward to participating in their care.  A copy of this report was sent to the requesting provider on this date.  Electronically Signed: Ascencion Dike 04/20/2015, 10:31 AM    I spent a total of 30 minutes in face to face in clinical consultation, greater than 50% of which was counseling/coordinating care for pancreatic and pleural fluid collections

## 2015-04-21 ENCOUNTER — Other Ambulatory Visit: Payer: Self-pay | Admitting: *Deleted

## 2015-04-21 NOTE — Patient Outreach (Signed)
[place] [facility]) Care Management  04/21/2015  Derek MARITATO Sr. March 27, 1945 [id number]   Transition of care (week 1/2nd attempt)  RN attempted outreach call to pt today however remains unsuccessful. Will continue outreach attempts accordingly.  Raina Mina, RN Care Management Coordinator [place] Office [phone]

## 2015-04-21 NOTE — Patient Outreach (Signed)
[place] [facility]) Care Management  04/20/2015  Derek [name] Sr. Apr 20, 1945 [id number]   RN received a call back via voice message from pt's wife. Will continue outreach attempts accordingly.  Raina Mina, RN Care Management Coordinator [place] Office [phone]

## 2015-04-22 ENCOUNTER — Encounter: Payer: Self-pay | Admitting: *Deleted

## 2015-04-22 ENCOUNTER — Other Ambulatory Visit: Payer: Self-pay | Admitting: *Deleted

## 2015-04-22 NOTE — Patient Outreach (Signed)
[place] [facility]) Care Management  04/22/2015  [name] 1945-04-09 [id number]   Transition of care  RN spoke with pt's spouse primary caregiver Kordai Skemp) who indicates pt is doing well with no problems. RN introduced the [facility] program and services along with the purpose of today's call. Caregiver very appreciative for the follow up call.  States upcoming follow up appointments however unable to recite the exact days for primary provider. Surgeon was scheduled on 2/28 with good report and pending an appointment with Dr. Loanne Drilling (endocrinologist) to allow CBG readings for possible dose change on medications. Reports involvement with a weekly [place] nurse to check sites from drainage tubings. States pt is eating better with no nutritional issues and his current weight is 222 lbs on Tuesday form 212 lbs upon discharge date.RN inquired on pt's HTN and DM. Caregiver states pt's BP is under control however his CBG are elevated due to his ongoing issues with drainage form his pancreas. Caregiver also reports pt is "doing to much activities" and getting overly exerted at times. Pt constantly needs reminders to slow down. There are no limits on his activities when RN inquired. RN offered community home visits to further educate pt on his ongoing medical conditions however caregiver feels pt does not need [facility] services at this time. RN also discussed health coaching with telephonic disease management as caregiver again opt to decline. Caregiver does not feel pt need ongoing transition of care calls with the involved at this time with this pt. Therefore RN offered to send education information via the mail along with the [place] calendar to assist with adherence to daily monitoring. Caregiver receptive and again grateful for the information provided today and follow up call. Will update primary provider of [facility] status with this pt.   Raina Mina, RN Care Management Coordinator [place] Office [phone]

## 2015-04-23 DIAGNOSIS — G4733 Obstructive sleep apnea (adult) (pediatric): Secondary | ICD-10-CM | POA: Diagnosis not present

## 2015-04-23 DIAGNOSIS — I10 Essential (primary) hypertension: Secondary | ICD-10-CM | POA: Diagnosis not present

## 2015-04-23 MED FILL — SURE COMFORT 0.5 ML SYRINGE: 30G X 1/2" | 50 days supply | Qty: 50 | Fill #1

## 2015-04-27 DIAGNOSIS — T814XXA Infection following a procedure, initial encounter: Secondary | ICD-10-CM | POA: Diagnosis not present

## 2015-04-27 MED FILL — AMOX-CLAV 875-125 MG TABLET: 875-125 | 5 days supply | Qty: 10 | Fill #0

## 2015-04-27 MED FILL — HYDROmorphone HCL 2 MG TABS: 2 | 5 days supply | Qty: 30 | Fill #0

## 2015-04-29 ENCOUNTER — Other Ambulatory Visit (HOSPITAL_COMMUNITY): Payer: Self-pay | Admitting: Interventional Radiology

## 2015-04-29 ENCOUNTER — Other Ambulatory Visit: Payer: Self-pay | Admitting: Surgery

## 2015-04-29 DIAGNOSIS — T814XXD Infection following a procedure, subsequent encounter: Principal | ICD-10-CM

## 2015-04-29 DIAGNOSIS — IMO0001 Reserved for inherently not codable concepts without codable children: Secondary | ICD-10-CM

## 2015-04-29 DIAGNOSIS — K651 Peritoneal abscess: Principal | ICD-10-CM

## 2015-05-04 ENCOUNTER — Encounter (HOSPITAL_COMMUNITY): Payer: Self-pay

## 2015-05-04 ENCOUNTER — Ambulatory Visit (HOSPITAL_COMMUNITY)
Admission: RE | Admit: 2015-05-04 | Discharge: 2015-05-04 | Disposition: A | Discharge status: Home / Self Care | Payer: PPO | Source: Ambulatory Visit | Attending: Interventional Radiology | Admitting: Interventional Radiology

## 2015-05-04 ENCOUNTER — Other Ambulatory Visit (HOSPITAL_COMMUNITY): Payer: Self-pay | Admitting: Interventional Radiology

## 2015-05-04 ENCOUNTER — Ambulatory Visit (HOSPITAL_COMMUNITY)
Admission: RE | Admit: 2015-05-04 | Discharge: 2015-05-04 | Disposition: A | Discharge status: Home / Self Care | Payer: PPO | Source: Ambulatory Visit | Attending: Surgery | Admitting: Surgery

## 2015-05-04 DIAGNOSIS — IMO0001 Reserved for inherently not codable concepts without codable children: Secondary | ICD-10-CM

## 2015-05-04 DIAGNOSIS — K651 Peritoneal abscess: Secondary | ICD-10-CM | POA: Diagnosis not present

## 2015-05-04 DIAGNOSIS — R109 Unspecified abdominal pain: Secondary | ICD-10-CM | POA: Diagnosis not present

## 2015-05-04 DIAGNOSIS — T814XXD Infection following a procedure, subsequent encounter: Principal | ICD-10-CM

## 2015-05-04 DIAGNOSIS — Z4803 Encounter for change or removal of drains: Secondary | ICD-10-CM | POA: Insufficient documentation

## 2015-05-04 MED ORDER — FENTANYL CITRATE (PF) 100 MCG/2ML IJ SOLN
INTRAMUSCULAR | Status: AC
  Administered 2015-05-04: 50 ug via INTRAVENOUS
  Filled 2015-05-04: qty 2

## 2015-05-04 MED ORDER — IOHEXOL 300 MG/ML  SOLN
100.0000 mL | Freq: Once | INTRAMUSCULAR | Status: AC | PRN
  Administered 2015-05-04: 80 mL via INTRAVENOUS

## 2015-05-04 MED ORDER — LIDOCAINE HCL 1 % IJ SOLN
INTRAMUSCULAR | Status: AC
  Filled 2015-05-04: qty 20

## 2015-05-04 MED ORDER — HYDROMORPHONE HCL 2 MG/ML IJ SOLN
INTRAMUSCULAR | Status: AC
  Administered 2015-05-04: 1 mg via INTRAVENOUS
  Filled 2015-05-04: qty 1

## 2015-05-04 MED ORDER — FENTANYL CITRATE (PF) 100 MCG/2ML IJ SOLN
50.0000 ug | Freq: Once | INTRAMUSCULAR | Status: AC
  Administered 2015-05-04: 50 ug via INTRAVENOUS

## 2015-05-04 MED ORDER — HYDROMORPHONE HCL 2 MG/ML IJ SOLN
1.0000 mg | Freq: Once | INTRAMUSCULAR | Status: AC
  Administered 2015-05-04: 1 mg via INTRAVENOUS

## 2015-05-04 MED ORDER — IOHEXOL 300 MG/ML  SOLN
5.0000 mL | Freq: Once | INTRAMUSCULAR | Status: AC | PRN
  Administered 2015-05-04: 5 mL

## 2015-05-04 NOTE — Clinical Social Work Psych Assess (Signed)
Clinical Social Work Nature conservation officer  Clinical Social Worker:  Pete Pelt Date/Time:  05/04/2015, 3:41 PM Referred By:  Clinical Social Work Date Referred:  05/04/15 Reason for Referral:  Psychosocial Assessment, Behavioral Health Issues, Crisis Intervention (Pt expressed SI)   Presenting Symptoms/Problems  Presenting Symptoms/Problems(in person's/family's own words):  "I was ready to pull the plug."   Abuse/Neglect/Trauma History  Abuse/Neglect/Trauma History:  Denies History Abuse/Neglect/Trauma History Comments (indicate dates):  N/A   Psychiatric History  Psychiatric History:  Denies History Psychiatric Medication:  N/A   Current Mental Health Hospitalizations/Previous Mental Health History:  N/A   Current Provider:  N/A Place and Date:  N/A  Current Medications:  N/A   Previous Inpatient Admission/Date/Reason:  N/A   Emotional Health/Current Symptoms  Suicide/Self Harm: Suicidal Ideation (ex. "I can't take anymore, I wish I could disappear") Suicide Attempt in Past (date/description):  N/A  Other Harmful Behavior (ex. homicidal ideation) (describe):  N/A   Psychotic/Dissociative Symptoms  Psychotic/Dissociative Symptoms: None Reported Other Psychotic/Dissociative Symptoms:  N/A   Attention/Behavioral Symptoms  Attention/Behavioral Symptoms:  Within Normal Limits  Other Attention/Behavioral Symptoms:  N/A   Cognitive Impairment  Cognitive Impairment:  Within Normal Limits Other Cognitive Impairment:  None   Mood and Adjustment  Mood and Adjustment:  Anxious, Depression   Stress, Anxiety, Trauma, Any Recent Loss/Stressor  Stress, Anxiety, Trauma, Any Recent Loss/Stressor: Anxiety (Pt stated that he has had panic attacks. ) Anxiety (frequency):  Pt has experienced some panic attacks, however relates the attacks to the constant worry about his health and his constant pain.  Phobia (specify):  None Mentioned  Compulsive  Behavior (specify):  None noted  Obsessive Behavior (specify):  None Noted  Other Stress, Anxiety, Trauma, Any Recent Loss/Stressor:  Pt was diagnosed with pancreatic cancer and is having complications from that surgery that has resulted in an increase in pain that the Pt is experiencing.   Substance Abuse/Use  Substance Abuse/Use: None SBIRT Completed (please refer for detailed history): No, N/A Self-reported Substance Use (last use and frequency):  None  Urinary Drug Screen Completed: No, N/A Alcohol Level:  N/A   Environment/Housing/Living Arrangement  Environmental/Housing/Living Arrangement: Stable Housing Who is in the Home:  Pt lives with his wife.  Emergency Contact:  Beckham Capistran  (wife [phone])   Financial  Financial: Private Insurance ([employer])   Patient's Strengths and Goals  Patient's Strengths and Goals (patient's own words):  Pt has the ability to go to his wife and daughter when he feels he can't take the pain and needs to express his anger. Pt also stated that he has "no intention to complete suicide because of what it would do to his daughter and his wife."   Clinical Social Worker's Interpretive Summary  Clinical Social Workers Interpretive Summary:  CSW met with the Pt and his wife at the bedside. CSW introduced self and reason for assessment. CSW requested that the Pt describe his version of why the assessment was placed. Pt stated that he was aware of why CSW was contacted. Pt stated he had said some things that worried people. Pt stated he said, "I was ready to pull the plug." Pt was ok with speaking with the CSW and denied wanting to actually commit suicide.  Pt stated that his pain has been so severe from the fluid building in his abdomen and has also experienced ongoing chronic pain. Pt stated that he "just could not stand the pain anymore and was venting to his family." Pt  was able to contract for safety. Pt did say that if he were  to experience any SI/HI/AH/VH that he will come in to the ED, however denied again that he was experiencing any of those symptoms at this time.    Disposition  Disposition: Psych Clinical Film/video editor

## 2015-05-05 ENCOUNTER — Other Ambulatory Visit: Payer: Self-pay | Admitting: Surgery

## 2015-05-05 DIAGNOSIS — T814XXD Infection following a procedure, subsequent encounter: Secondary | ICD-10-CM | POA: Diagnosis not present

## 2015-05-05 DIAGNOSIS — C7A8 Other malignant neuroendocrine tumors: Secondary | ICD-10-CM | POA: Diagnosis not present

## 2015-05-05 DIAGNOSIS — IMO0001 Reserved for inherently not codable concepts without codable children: Secondary | ICD-10-CM

## 2015-05-05 DIAGNOSIS — T814XXA Infection following a procedure, initial encounter: Secondary | ICD-10-CM | POA: Diagnosis not present

## 2015-05-05 DIAGNOSIS — K651 Peritoneal abscess: Principal | ICD-10-CM

## 2015-05-05 DIAGNOSIS — K863 Pseudocyst of pancreas: Secondary | ICD-10-CM | POA: Diagnosis not present

## 2015-05-05 MED FILL — SULFAMETHOXAZOLE-TMP DS TAB: 800-160 | 10 days supply | Qty: 20 | Fill #0

## 2015-05-05 MED FILL — HYDROmorphone HCL 2 MG TABS: 2 | 6 days supply | Qty: 40 | Fill #0

## 2015-05-06 ENCOUNTER — Other Ambulatory Visit: Payer: PPO

## 2015-05-06 DIAGNOSIS — Z794 Long term (current) use of insulin: Secondary | ICD-10-CM | POA: Diagnosis not present

## 2015-05-06 DIAGNOSIS — I959 Hypotension, unspecified: Secondary | ICD-10-CM | POA: Diagnosis not present

## 2015-05-06 DIAGNOSIS — K859 Acute pancreatitis without necrosis or infection, unspecified: Secondary | ICD-10-CM | POA: Diagnosis not present

## 2015-05-06 DIAGNOSIS — E119 Type 2 diabetes mellitus without complications: Secondary | ICD-10-CM | POA: Diagnosis not present

## 2015-05-06 DIAGNOSIS — K651 Peritoneal abscess: Secondary | ICD-10-CM | POA: Diagnosis not present

## 2015-05-10 ENCOUNTER — Other Ambulatory Visit: Payer: Self-pay | Admitting: *Deleted

## 2015-05-10 DIAGNOSIS — T814XXA Infection following a procedure, initial encounter: Principal | ICD-10-CM

## 2015-05-10 DIAGNOSIS — K651 Peritoneal abscess: Principal | ICD-10-CM

## 2015-05-10 DIAGNOSIS — IMO0001 Reserved for inherently not codable concepts without codable children: Secondary | ICD-10-CM

## 2015-05-13 ENCOUNTER — Inpatient Hospital Stay (HOSPITAL_COMMUNITY)
Admission: AD | Admit: 2015-05-13 | Discharge: 2015-05-20 | DRG: 407 | Disposition: A | Discharge status: Home / Self Care | Payer: PPO | Source: Ambulatory Visit | Attending: Surgery | Admitting: Surgery

## 2015-05-13 ENCOUNTER — Encounter (HOSPITAL_COMMUNITY): Payer: Self-pay

## 2015-05-13 ENCOUNTER — Ambulatory Visit (HOSPITAL_COMMUNITY)
Admission: RE | Admit: 2015-05-13 | Discharge: 2015-05-13 | Disposition: A | Discharge status: Home / Self Care | Payer: PPO | Source: Ambulatory Visit | Attending: Surgery | Admitting: Surgery

## 2015-05-13 DIAGNOSIS — Z9081 Acquired absence of spleen: Secondary | ICD-10-CM | POA: Diagnosis not present

## 2015-05-13 DIAGNOSIS — Z8249 Family history of ischemic heart disease and other diseases of the circulatory system: Secondary | ICD-10-CM | POA: Diagnosis not present

## 2015-05-13 DIAGNOSIS — IMO0001 Reserved for inherently not codable concepts without codable children: Secondary | ICD-10-CM

## 2015-05-13 DIAGNOSIS — H8109 Meniere's disease, unspecified ear: Secondary | ICD-10-CM | POA: Diagnosis not present

## 2015-05-13 DIAGNOSIS — G4733 Obstructive sleep apnea (adult) (pediatric): Secondary | ICD-10-CM | POA: Diagnosis present

## 2015-05-13 DIAGNOSIS — Z886 Allergy status to analgesic agent status: Secondary | ICD-10-CM

## 2015-05-13 DIAGNOSIS — Z87891 Personal history of nicotine dependence: Secondary | ICD-10-CM

## 2015-05-13 DIAGNOSIS — Z794 Long term (current) use of insulin: Secondary | ICD-10-CM

## 2015-05-13 DIAGNOSIS — M1990 Unspecified osteoarthritis, unspecified site: Secondary | ICD-10-CM | POA: Diagnosis present

## 2015-05-13 DIAGNOSIS — N400 Enlarged prostate without lower urinary tract symptoms: Secondary | ICD-10-CM | POA: Diagnosis present

## 2015-05-13 DIAGNOSIS — F329 Major depressive disorder, single episode, unspecified: Secondary | ICD-10-CM | POA: Diagnosis present

## 2015-05-13 DIAGNOSIS — Z888 Allergy status to other drugs, medicaments and biological substances status: Secondary | ICD-10-CM

## 2015-05-13 DIAGNOSIS — I10 Essential (primary) hypertension: Secondary | ICD-10-CM | POA: Diagnosis present

## 2015-05-13 DIAGNOSIS — Z808 Family history of malignant neoplasm of other organs or systems: Secondary | ICD-10-CM | POA: Diagnosis not present

## 2015-05-13 DIAGNOSIS — Z79899 Other long term (current) drug therapy: Secondary | ICD-10-CM | POA: Diagnosis not present

## 2015-05-13 DIAGNOSIS — K219 Gastro-esophageal reflux disease without esophagitis: Secondary | ICD-10-CM | POA: Diagnosis not present

## 2015-05-13 DIAGNOSIS — Z87442 Personal history of urinary calculi: Secondary | ICD-10-CM

## 2015-05-13 DIAGNOSIS — Z885 Allergy status to narcotic agent status: Secondary | ICD-10-CM | POA: Diagnosis not present

## 2015-05-13 DIAGNOSIS — K863 Pseudocyst of pancreas: Secondary | ICD-10-CM | POA: Diagnosis not present

## 2015-05-13 DIAGNOSIS — Z833 Family history of diabetes mellitus: Secondary | ICD-10-CM

## 2015-05-13 DIAGNOSIS — Z8507 Personal history of malignant neoplasm of pancreas: Secondary | ICD-10-CM

## 2015-05-13 DIAGNOSIS — Z7982 Long term (current) use of aspirin: Secondary | ICD-10-CM | POA: Diagnosis not present

## 2015-05-13 DIAGNOSIS — H9191 Unspecified hearing loss, right ear: Secondary | ICD-10-CM | POA: Diagnosis present

## 2015-05-13 DIAGNOSIS — R109 Unspecified abdominal pain: Secondary | ICD-10-CM | POA: Diagnosis not present

## 2015-05-13 DIAGNOSIS — T814XXD Infection following a procedure, subsequent encounter: Secondary | ICD-10-CM

## 2015-05-13 DIAGNOSIS — K651 Peritoneal abscess: Secondary | ICD-10-CM | POA: Diagnosis not present

## 2015-05-13 DIAGNOSIS — E119 Type 2 diabetes mellitus without complications: Secondary | ICD-10-CM | POA: Diagnosis present

## 2015-05-13 DIAGNOSIS — H8101 Meniere's disease, right ear: Secondary | ICD-10-CM | POA: Diagnosis not present

## 2015-05-13 DIAGNOSIS — F419 Anxiety disorder, unspecified: Secondary | ICD-10-CM | POA: Diagnosis present

## 2015-05-13 DIAGNOSIS — D72829 Elevated white blood cell count, unspecified: Secondary | ICD-10-CM | POA: Diagnosis not present

## 2015-05-13 LAB — CBC WITH DIFFERENTIAL/PLATELET
Basophils Absolute: 0.2 10*3/uL — ABNORMAL HIGH (ref 0.0–0.1)
Basophils Relative: 1 %
Eosinophils Absolute: 0.2 10*3/uL (ref 0.0–0.7)
Eosinophils Relative: 1 %
HCT: 32.1 % — ABNORMAL LOW (ref 39.0–52.0)
Hemoglobin: 10.1 g/dL — ABNORMAL LOW (ref 13.0–17.0)
Lymphocytes Relative: 11 %
Lymphs Abs: 1.8 10*3/uL (ref 0.7–4.0)
MCH: 24.2 pg — ABNORMAL LOW (ref 26.0–34.0)
MCHC: 31.5 g/dL (ref 30.0–36.0)
MCV: 77 fL — ABNORMAL LOW (ref 78.0–100.0)
Monocytes Absolute: 2.6 10*3/uL — ABNORMAL HIGH (ref 0.1–1.0)
Monocytes Relative: 16 %
Neutro Abs: 11.3 10*3/uL — ABNORMAL HIGH (ref 1.7–7.7)
Neutrophils Relative %: 71 %
Platelets: 911 10*3/uL (ref 150–400)
RBC: 4.17 MIL/uL — ABNORMAL LOW (ref 4.22–5.81)
RDW: 18.3 % — ABNORMAL HIGH (ref 11.5–15.5)
WBC: 16.1 10*3/uL — ABNORMAL HIGH (ref 4.0–10.5)

## 2015-05-13 LAB — PROTIME-INR
INR: 1.17 (ref 0.00–1.49)
Prothrombin Time: 14.6 seconds (ref 11.6–15.2)

## 2015-05-13 LAB — GLUCOSE, CAPILLARY
Glucose-Capillary: 159 mg/dL — ABNORMAL HIGH (ref 65–99)
Glucose-Capillary: 171 mg/dL — ABNORMAL HIGH (ref 65–99)
Glucose-Capillary: 232 mg/dL — ABNORMAL HIGH (ref 65–99)

## 2015-05-13 LAB — COMPREHENSIVE METABOLIC PANEL
ALT: 9 U/L — ABNORMAL LOW (ref 17–63)
AST: 14 U/L — ABNORMAL LOW (ref 15–41)
Albumin: 3.4 g/dL — ABNORMAL LOW (ref 3.5–5.0)
Alkaline Phosphatase: 109 U/L (ref 38–126)
Anion gap: 11 (ref 5–15)
BUN: 16 mg/dL (ref 6–20)
CO2: 24 mmol/L (ref 22–32)
Calcium: 12.5 mg/dL — ABNORMAL HIGH (ref 8.9–10.3)
Chloride: 95 mmol/L — ABNORMAL LOW (ref 101–111)
Creatinine, Ser: 1.44 mg/dL — ABNORMAL HIGH (ref 0.61–1.24)
GFR calc Af Amer: 55 mL/min — ABNORMAL LOW (ref >60)
GFR calc non Af Amer: 48 mL/min — ABNORMAL LOW (ref >60)
Glucose, Bld: 204 mg/dL — ABNORMAL HIGH (ref 65–99)
Potassium: 4.7 mmol/L (ref 3.5–5.1)
Sodium: 130 mmol/L — ABNORMAL LOW (ref 135–145)
Total Bilirubin: 0.2 mg/dL — ABNORMAL LOW (ref 0.3–1.2)
Total Protein: 8.6 g/dL — ABNORMAL HIGH (ref 6.5–8.1)

## 2015-05-13 LAB — SURGICAL PCR SCREEN
MRSA, PCR: NEGATIVE
Staphylococcus aureus: NEGATIVE

## 2015-05-13 MED ORDER — PIPERACILLIN-TAZOBACTAM 3.375 G IVPB
3.3750 g | Freq: Three times a day (TID) | INTRAVENOUS | Status: DC
  Administered 2015-05-13 – 2015-05-17 (×11): 3.375 g via INTRAVENOUS
  Filled 2015-05-13 (×18): qty 50

## 2015-05-13 MED ORDER — ONDANSETRON HCL 4 MG/2ML IJ SOLN
INTRAMUSCULAR | Status: AC
  Filled 2015-05-13: qty 2

## 2015-05-13 MED ORDER — ONDANSETRON 4 MG PO TBDP: 4.0000 mg | ORAL_TABLET | Freq: Four times a day (QID) | ORAL | Status: DC | PRN

## 2015-05-13 MED ORDER — HYDROMORPHONE HCL 2 MG/ML IJ SOLN
INTRAMUSCULAR | Status: AC
  Filled 2015-05-13: qty 1

## 2015-05-13 MED ORDER — ACETAMINOPHEN 650 MG RE SUPP: 650.0000 mg | Freq: Four times a day (QID) | RECTAL | Status: DC | PRN

## 2015-05-13 MED ORDER — IOPAMIDOL (ISOVUE-300) INJECTION 61%
100.0000 mL | Freq: Once | INTRAVENOUS | Status: AC | PRN
  Administered 2015-05-13: 100 mL via INTRAVENOUS

## 2015-05-13 MED ORDER — PANTOPRAZOLE SODIUM 40 MG IV SOLR
40.0000 mg | Freq: Every day | INTRAVENOUS | Status: DC
  Administered 2015-05-13 – 2015-05-16 (×4): 40 mg via INTRAVENOUS
  Filled 2015-05-13 (×7): qty 40

## 2015-05-13 MED ORDER — HYDROCODONE-ACETAMINOPHEN 5-325 MG PO TABS: 1.0000 | ORAL_TABLET | ORAL | Status: DC | PRN

## 2015-05-13 MED ORDER — HYDROMORPHONE HCL 1 MG/ML IJ SOLN
1.0000 mg | INTRAMUSCULAR | Status: DC | PRN
  Administered 2015-05-13 – 2015-05-17 (×5): 1 mg via INTRAVENOUS
  Filled 2015-05-13 (×11): qty 1

## 2015-05-13 MED ORDER — ONDANSETRON HCL 4 MG/2ML IJ SOLN
4.0000 mg | Freq: Four times a day (QID) | INTRAMUSCULAR | Status: DC | PRN
  Administered 2015-05-13 – 2015-05-16 (×3): 4 mg via INTRAVENOUS
  Filled 2015-05-13: qty 2

## 2015-05-13 MED ORDER — HYDROMORPHONE HCL 2 MG PO TABS
2.0000 mg | ORAL_TABLET | ORAL | Status: DC | PRN
  Administered 2015-05-13: 4 mg via ORAL
  Administered 2015-05-17 – 2015-05-20 (×16): 2 mg via ORAL
  Filled 2015-05-13 (×2): qty 1
  Filled 2015-05-13: qty 2
  Filled 2015-05-13 (×12): qty 1
  Filled 2015-05-13: qty 2
  Filled 2015-05-13 (×5): qty 1

## 2015-05-13 MED ORDER — ACETAMINOPHEN 325 MG PO TABS: 650.0000 mg | ORAL_TABLET | Freq: Four times a day (QID) | ORAL | Status: DC | PRN

## 2015-05-13 MED ORDER — HYDROMORPHONE HCL 2 MG/ML IJ SOLN
2.0000 mg | Freq: Once | INTRAMUSCULAR | Status: AC
  Administered 2015-05-13: 2 mg via INTRAVENOUS

## 2015-05-13 MED ORDER — INSULIN ASPART 100 UNIT/ML ~~LOC~~ SOLN
0.0000 [IU] | SUBCUTANEOUS | Status: DC
  Administered 2015-05-13: 3 [IU] via SUBCUTANEOUS
  Administered 2015-05-13: 5 [IU] via SUBCUTANEOUS
  Administered 2015-05-13 – 2015-05-14 (×4): 3 [IU] via SUBCUTANEOUS
  Administered 2015-05-14: 5 [IU] via SUBCUTANEOUS
  Administered 2015-05-14 – 2015-05-15 (×3): 3 [IU] via SUBCUTANEOUS
  Administered 2015-05-15 (×2): 5 [IU] via SUBCUTANEOUS
  Administered 2015-05-15: 11 [IU] via SUBCUTANEOUS
  Administered 2015-05-15: 3 [IU] via SUBCUTANEOUS
  Administered 2015-05-16 (×2): 2 [IU] via SUBCUTANEOUS
  Administered 2015-05-16: 5 [IU] via SUBCUTANEOUS
  Administered 2015-05-16: 2 [IU] via SUBCUTANEOUS
  Administered 2015-05-17: 5 [IU] via SUBCUTANEOUS
  Administered 2015-05-17: 11 [IU] via SUBCUTANEOUS
  Administered 2015-05-17: 3 [IU] via SUBCUTANEOUS
  Administered 2015-05-17: 8 [IU] via SUBCUTANEOUS
  Administered 2015-05-17 (×2): 5 [IU] via SUBCUTANEOUS
  Administered 2015-05-18: 11 [IU] via SUBCUTANEOUS
  Administered 2015-05-18: 8 [IU] via SUBCUTANEOUS
  Administered 2015-05-18: 2 [IU] via SUBCUTANEOUS
  Administered 2015-05-18: 8 [IU] via SUBCUTANEOUS
  Administered 2015-05-18: 2 [IU] via SUBCUTANEOUS
  Administered 2015-05-18 – 2015-05-19 (×3): 8 [IU] via SUBCUTANEOUS
  Administered 2015-05-19: 3 [IU] via SUBCUTANEOUS
  Administered 2015-05-19 (×3): 11 [IU] via SUBCUTANEOUS
  Administered 2015-05-20 (×2): 5 [IU] via SUBCUTANEOUS
  Administered 2015-05-20: 3 [IU] via SUBCUTANEOUS

## 2015-05-13 MED ORDER — ONDANSETRON HCL 4 MG/2ML IJ SOLN
4.0000 mg | Freq: Once | INTRAMUSCULAR | Status: AC
  Administered 2015-05-13: 4 mg via INTRAVENOUS

## 2015-05-13 MED ORDER — KCL IN DEXTROSE-NACL 20-5-0.45 MEQ/L-%-% IV SOLN
INTRAVENOUS | Status: DC
  Administered 2015-05-13 – 2015-05-14 (×2): via INTRAVENOUS
  Administered 2015-05-14: 1000 mL via INTRAVENOUS
  Administered 2015-05-14 – 2015-05-15 (×2): via INTRAVENOUS
  Filled 2015-05-13 (×10): qty 1000

## 2015-05-13 NOTE — H&P (Signed)
Derek [name] Sr. is an 70 y.o. male.    General Surgery [facility]  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  Post op course complicated by abscess and pseudocyst formation.  Multiple manipulations of percutaneous drains have not been successful in resolving the infected pseudocyst.  Repeat CT today shows no further progress.  Patient complains of pain, nausea, and emesis.  Currently on oral Bactrim.  Drain fluid show elevated amylase level >10,000.  Now admitted for surgical management of infected pancreatic pseudocyst.  Past Medical History  Diagnosis Date  . Hypertension   . Diabetes mellitus   . GERD (gastroesophageal reflux disease)   . Meniere's disease   . Morbid obesity ([place])   . Depression   . Arthritis   . 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 Anxiety     prone to "panic attacks"  . 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 History of kidney stones     multiple kidney stones-not a problem at present  . Kidney stone   . Dermatitis     bilateral hands  . BPH (benign prostatic hyperplasia)   . Shortness of breath dyspnea   . 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 DDD (degenerative disc disease), lumbar     gets back injections   . 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 Straining on urination   . Obstructive sleep apnea     " i used to have sleep apnea" never used a c-pap   . Cancer [facility])     newly diagnosed pancreatic cancer    Past Surgical History  Procedure Laterality Date  . Knee arthroscopy Right 2002    rt knee  . Rotator cuff repair  '93bilateral '94 lt    x3  . Umbilical hernia repair  2000  . Knee arthroscopy  03/01/2012    Procedure: ARTHROSCOPY KNEE;  Surgeon: Gearlean Alf, MD;  Location: [facility];   Service: Orthopedics;  Laterality: Left;  WITH SYNOVECTOMY   . Ankle surgery Left 2010    left-fx- some retained hardware as of 05-22-13  . Hardware removal Left 2013    ankle-lt  . Anal fistulectomy N/A 04/01/2013    Procedure: EXAM UNDER ANESTHESIA WITH ANAL FISSURectomy, sphincterotomy and internal hemorrhoidectomy;  Surgeon: Harl Bowie, MD;  Location: [facility];  Service: General;  Laterality: N/A;  . Replacement total knee Left 2009    knee-left  . Total knee arthroplasty Right 06/02/2013    Procedure: RIGHT TOTAL KNEE ARTHROPLASTY;  Surgeon: Gearlean Alf, MD;  Location: [facility];  Service: Orthopedics;  Laterality: Right;  . Colonoscopy    . Upper gastrointestinal endoscopy  sept 2016  . Inguinal hernia repair  '74,'82    x 2, inguinal  . Eus N/A 12/31/2014    Procedure: UPPER ENDOSCOPIC ULTRASOUND (EUS) LINEAR;  Surgeon: Milus Banister, MD;  Location: [facility];  Service: Endoscopy;  Laterality: N/A;    Family History  Problem Relation Age of Onset  . Factor V Leiden deficiency Daughter   . Diabetes Maternal Grandmother   . Heart disease Paternal Uncle   . Bone cancer Father     Jaw  . Colon cancer Neg Hx   . Esophageal  cancer Neg Hx   . Stomach cancer Neg Hx   . Rectal cancer Neg Hx    Social History:  reports that he quit smoking about 40 years ago. His smoking use included Cigarettes. He has a 68 pack-year smoking history. He has never used smokeless tobacco. He reports that he does not drink alcohol or use illicit drugs.  Allergies:  Allergies  Allergen Reactions  . Morphine And Related Nausea And Vomiting  . Quinine Other (See Comments)    Platelets dropped  . Voltaren [Diclofenac Sodium] Other (See Comments)    Elevated liver enzymes  . Oxycontin [Oxycodone Hcl] Swelling    Lips swells. Tolerates immediate-release oxycodone as well as hydrocodone.    Medications Prior to Admission  Medication Sig Dispense Refill  . aspirin EC 81 MG  tablet Take 81 mg by mouth at bedtime.    . carvedilol (COREG) 25 MG tablet Take 0.5 tablets (12.5 mg total) by mouth 2 (two) times daily with a meal. 60 tablet 1  . clobetasol ointment (TEMOVATE) [id number] % Apply 1 application topically daily as needed (for peeling skin on hands and feet). Reported on 03/17/2015    . [employer] TRIPLE MAGNESIUM COMPLEX PO Take 1-2 capsules by mouth 2 (two) times daily. Two capsules in the morning and one at night    . diphenhydrAMINE (BENADRYL) 25 mg capsule Take 50 mg by mouth 2 (two) times daily. Reported on 03/17/2015    . HYDROmorphone (DILAUDID) 2 MG tablet Take 1 tablet (2 mg total) by mouth every 3 (three) hours as needed for moderate pain or severe pain. 30 tablet 0  . HYDROmorphone (DILAUDID) 2 MG tablet Take 1 tablet (2 mg total) by mouth every 4 (four) hours as needed for moderate pain or severe pain. 30 tablet 0  . insulin aspart (NOVOLOG) 100 UNIT/ML injection Inject 0-9 Units into the skin 3 (three) times daily before meals. 10 mL 11  . insulin glargine (LANTUS) 100 UNIT/ML injection Inject 0.2 mLs (20 Units total) into the skin daily. 10 mL 11  . Insulin Syringes, Disposable, U-100 0.5 ML MISC 20 Units by Does not apply route daily. 100 each 1  . lansoprazole (PREVACID) 15 MG capsule Take 30 mg by mouth daily as needed (reflux). Reported on 03/17/2015    . naproxen sodium (ALEVE) 220 MG tablet Take 440 mg by mouth 2 (two) times daily with a meal.     . ONETOUCH DELICA LANCETS [id number] MISC 1 Device by Does not apply route 4 (four) times daily. 120 each 11    No results found for this or any previous visit (from the past 48 hour(s)). No results found.  Review of Systems  Constitutional: Positive for weight loss.  HENT: Negative.   Eyes: Negative.   Respiratory: Negative.   Cardiovascular: Negative.   Gastrointestinal: Positive for nausea, vomiting and abdominal pain.  Genitourinary: Negative.   Musculoskeletal: Positive for back pain.  Skin: Negative.    Neurological: Positive for weakness.  Endo/Heme/Allergies: Negative.   Psychiatric/Behavioral: Negative.     There were no vitals taken for this visit. Physical Exam  Constitutional: He is oriented to person, place, and time. He appears well-developed and well-nourished. No distress.  HENT:  Head: Normocephalic and atraumatic.  Right Ear: External ear normal.  Left Ear: External ear normal.  Eyes: Conjunctivae are normal. Pupils are equal, round, and reactive to light. No scleral icterus.  Neck: Normal range of motion. Neck supple. No tracheal deviation present. No thyromegaly present.  Cardiovascular: Normal rate, regular rhythm and normal heart sounds.   Respiratory: Effort normal and breath sounds normal. No respiratory distress. He has no wheezes.  GI: Bowel sounds are normal. He exhibits no distension and no mass. There is tenderness (LUQ). There is no rebound and no guarding.  Two perc drains from left flank with tan thin drainage output  Musculoskeletal: Normal range of motion. He exhibits no edema.  Neurological: He is alert and oriented to person, place, and time.  Skin: Skin is warm. He is not diaphoretic.  Psychiatric: He has a normal mood and affect. His behavior is normal.     Assessment/Plan Infected pancreatic pseudocyst  Admit to general surgery  IV Zosyn to replace oral Bactrim  Will schedule laparotomy for 05/14/2015 for drainage of pseudocyst  The risks and benefits of the procedure have been discussed at length with the patient.  The patient understands the proposed procedure, potential alternative treatments, and the course of recovery to be expected.  All of the patient's questions have been answered at this time.  The patient wishes to proceed with surgery.  [name], MD, [facility] Office: [place], MD 05/13/2015, 1:32 PM

## 2015-05-13 NOTE — Progress Notes (Signed)
Pharmacy Antibiotic Note  Derek [name]. is a 70 y.o. male admitted on 05/13/2015 with infected pancreatic pseudocyst.  Pharmacy has been consulted for Zosyn dosing. 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 Post op course complicated by abscess and pseudocyst formation. Multiple manipulations of percutaneous drains have not been successful in resolving the infected pseudocyst. Repeat CT today shows no further progress. Patient complains of pain, nausea, and emesis. Pt was on  oral Bactrim, and now is being transitioned to Zosyn  Plan: Zosyn 3.375g IV q8h (4 hour infusion).     No data recorded.  No results for input(s): WBC, CREATININE, LATICACIDVEN, VANCOTROUGH, VANCOPEAK, VANCORANDOM, GENTTROUGH, GENTPEAK, GENTRANDOM, TOBRATROUGH, TOBRAPEAK, TOBRARND, AMIKACINPEAK, AMIKACINTROU, AMIKACIN in the last 168 hours.  CrCl cannot be calculated (Unknown ideal weight.).    Allergies  Allergen Reactions  . Morphine And Related Nausea And Vomiting  . Quinine Other (See Comments)    Platelets dropped  . Voltaren [Diclofenac Sodium] Other (See Comments)    Elevated liver enzymes  . Oxycontin [Oxycodone Hcl] Swelling    Lips swells. Tolerates immediate-release oxycodone as well as hydrocodone.    Antimicrobials this admission: Zosyn 3/23 >>    Dose adjustments this admission: ----  Microbiology results:  Thank you for allowing pharmacy to be a part of this patient's care.   Royetta Asal, PharmD, BCPS Pager [phone] 05/13/2015 2:03 PM

## 2015-05-13 NOTE — Progress Notes (Signed)
CRITICAL VALUE ALERT  Critical value received:  Platelets [id number]  Date of notification:  05/13/2015  Time of notification:  [id number]  Critical value read back:Yes.    Nurse who received alert:  Benedetto Goad, RN  MD notified (1st page):  Dr. Redmond Pulling paged by [facility] answering service  Time of first page:  60  MD notified (2nd page): NA  Time of second page: NA  Responding MD:  Dr. Redmond Pulling  Time MD responded:  [phone]

## 2015-05-14 ENCOUNTER — Inpatient Hospital Stay (HOSPITAL_COMMUNITY): Payer: PPO | Admitting: Certified Registered Nurse Anesthetist

## 2015-05-14 ENCOUNTER — Encounter (HOSPITAL_COMMUNITY)
Admission: AD | Disposition: A | Discharge status: Home / Self Care | Payer: Self-pay | Source: Ambulatory Visit | Attending: Surgery

## 2015-05-14 ENCOUNTER — Encounter (HOSPITAL_COMMUNITY): Payer: Self-pay

## 2015-05-14 DIAGNOSIS — I10 Essential (primary) hypertension: Secondary | ICD-10-CM | POA: Diagnosis not present

## 2015-05-14 DIAGNOSIS — E119 Type 2 diabetes mellitus without complications: Secondary | ICD-10-CM | POA: Diagnosis not present

## 2015-05-14 DIAGNOSIS — N189 Chronic kidney disease, unspecified: Secondary | ICD-10-CM | POA: Diagnosis not present

## 2015-05-14 DIAGNOSIS — S31109A Unspecified open wound of abdominal wall, unspecified quadrant without penetration into peritoneal cavity, initial encounter: Secondary | ICD-10-CM | POA: Diagnosis not present

## 2015-05-14 DIAGNOSIS — K219 Gastro-esophageal reflux disease without esophagitis: Secondary | ICD-10-CM | POA: Diagnosis not present

## 2015-05-14 DIAGNOSIS — S31609A Unspecified open wound of abdominal wall, unspecified quadrant with penetration into peritoneal cavity, initial encounter: Secondary | ICD-10-CM | POA: Diagnosis not present

## 2015-05-14 DIAGNOSIS — K863 Pseudocyst of pancreas: Secondary | ICD-10-CM | POA: Diagnosis not present

## 2015-05-14 DIAGNOSIS — I129 Hypertensive chronic kidney disease with stage 1 through stage 4 chronic kidney disease, or unspecified chronic kidney disease: Secondary | ICD-10-CM | POA: Diagnosis not present

## 2015-05-14 HISTORY — PX: INCISION AND DRAINAGE ABSCESS: SHX5864

## 2015-05-14 HISTORY — PX: LAPAROTOMY: SHX154

## 2015-05-14 LAB — CBC
HCT: 32 % — ABNORMAL LOW (ref 39.0–52.0)
Hemoglobin: 10.3 g/dL — ABNORMAL LOW (ref 13.0–17.0)
MCH: 24.5 pg — ABNORMAL LOW (ref 26.0–34.0)
MCHC: 32.2 g/dL (ref 30.0–36.0)
MCV: 76.2 fL — ABNORMAL LOW (ref 78.0–100.0)
Platelets: 789 10*3/uL — ABNORMAL HIGH (ref 150–400)
RBC: 4.2 MIL/uL — ABNORMAL LOW (ref 4.22–5.81)
RDW: 18.2 % — ABNORMAL HIGH (ref 11.5–15.5)
WBC: 17.1 10*3/uL — ABNORMAL HIGH (ref 4.0–10.5)

## 2015-05-14 LAB — GLUCOSE, CAPILLARY
Glucose-Capillary: 161 mg/dL — ABNORMAL HIGH (ref 65–99)
Glucose-Capillary: 175 mg/dL — ABNORMAL HIGH (ref 65–99)
Glucose-Capillary: 178 mg/dL — ABNORMAL HIGH (ref 65–99)
Glucose-Capillary: 186 mg/dL — ABNORMAL HIGH (ref 65–99)
Glucose-Capillary: 190 mg/dL — ABNORMAL HIGH (ref 65–99)
Glucose-Capillary: 192 mg/dL — ABNORMAL HIGH (ref 65–99)
Glucose-Capillary: 216 mg/dL — ABNORMAL HIGH (ref 65–99)

## 2015-05-14 LAB — PROTIME-INR
INR: 1.27 (ref 0.00–1.49)
Prothrombin Time: 15.5 seconds — ABNORMAL HIGH (ref 11.6–15.2)

## 2015-05-14 LAB — TYPE AND SCREEN
ABO/RH(D): O POS
Antibody Screen: NEGATIVE

## 2015-05-14 LAB — APTT: aPTT: 31 seconds (ref 24–37)

## 2015-05-14 SURGERY — LAPAROTOMY, EXPLORATORY
Anesthesia: General

## 2015-05-14 MED ORDER — KCL IN DEXTROSE-NACL 20-5-0.45 MEQ/L-%-% IV SOLN: INTRAVENOUS | Status: DC

## 2015-05-14 MED ORDER — PROMETHAZINE HCL 25 MG/ML IJ SOLN: 6.2500 mg | INTRAMUSCULAR | Status: DC | PRN

## 2015-05-14 MED ORDER — HYDROMORPHONE HCL 1 MG/ML IJ SOLN: 0.2500 mg | INTRAMUSCULAR | Status: DC | PRN

## 2015-05-14 MED ORDER — FENTANYL CITRATE (PF) 250 MCG/5ML IJ SOLN
INTRAMUSCULAR | Status: AC
  Filled 2015-05-14: qty 5

## 2015-05-14 MED ORDER — SODIUM CHLORIDE 0.9 % IR SOLN
Status: DC | PRN
  Administered 2015-05-14: 3000 mL

## 2015-05-14 MED ORDER — HYDROMORPHONE HCL 1 MG/ML IJ SOLN
1.0000 mg | INTRAMUSCULAR | Status: DC | PRN
  Administered 2015-05-14 – 2015-05-16 (×11): 1 mg via INTRAVENOUS
  Filled 2015-05-14 (×5): qty 1

## 2015-05-14 MED ORDER — LIDOCAINE HCL (CARDIAC) 20 MG/ML IV SOLN
INTRAVENOUS | Status: AC
  Filled 2015-05-14: qty 5

## 2015-05-14 MED ORDER — LOSARTAN POTASSIUM 50 MG PO TABS
50.0000 mg | ORAL_TABLET | Freq: Every day | ORAL | Status: DC
  Administered 2015-05-15 – 2015-05-20 (×5): 50 mg via ORAL
  Filled 2015-05-14 (×7): qty 1

## 2015-05-14 MED ORDER — ROCURONIUM BROMIDE 100 MG/10ML IV SOLN
INTRAVENOUS | Status: AC
  Filled 2015-05-14: qty 1

## 2015-05-14 MED ORDER — HYDROMORPHONE HCL 2 MG PO TABS
2.0000 mg | ORAL_TABLET | ORAL | Status: DC | PRN
  Filled 2015-05-14: qty 1

## 2015-05-14 MED ORDER — LIDOCAINE HCL (CARDIAC) 20 MG/ML IV SOLN
INTRAVENOUS | Status: DC | PRN
Start: 2015-05-14 — End: 2015-05-14
  Administered 2015-05-14: 60 mg via INTRAVENOUS

## 2015-05-14 MED ORDER — SUGAMMADEX SODIUM 200 MG/2ML IV SOLN
INTRAVENOUS | Status: DC | PRN
  Administered 2015-05-14: 200 mg via INTRAVENOUS

## 2015-05-14 MED ORDER — MIDAZOLAM HCL 5 MG/5ML IJ SOLN
INTRAMUSCULAR | Status: DC | PRN
  Administered 2015-05-14: 2 mg via INTRAVENOUS

## 2015-05-14 MED ORDER — PHENYLEPHRINE HCL 10 MG/ML IJ SOLN
INTRAMUSCULAR | Status: DC | PRN
  Administered 2015-05-14 (×6): 80 ug via INTRAVENOUS

## 2015-05-14 MED ORDER — FENTANYL CITRATE (PF) 100 MCG/2ML IJ SOLN
INTRAMUSCULAR | Status: DC | PRN
  Administered 2015-05-14 (×4): 50 ug via INTRAVENOUS

## 2015-05-14 MED ORDER — MIDAZOLAM HCL 2 MG/2ML IJ SOLN: 0.5000 mg | Freq: Once | INTRAMUSCULAR | Status: DC | PRN

## 2015-05-14 MED ORDER — ONDANSETRON HCL 4 MG/2ML IJ SOLN
INTRAMUSCULAR | Status: AC
  Filled 2015-05-14: qty 2

## 2015-05-14 MED ORDER — MEPERIDINE HCL 50 MG/ML IJ SOLN: 6.2500 mg | INTRAMUSCULAR | Status: DC | PRN

## 2015-05-14 MED ORDER — SUCCINYLCHOLINE CHLORIDE 20 MG/ML IJ SOLN
INTRAMUSCULAR | Status: DC | PRN
  Administered 2015-05-14: 100 mg via INTRAVENOUS

## 2015-05-14 MED ORDER — PANTOPRAZOLE SODIUM 40 MG IV SOLR: 40.0000 mg | Freq: Every day | INTRAVENOUS | Status: DC

## 2015-05-14 MED ORDER — HYDROMORPHONE HCL 2 MG PO TABS
2.0000 mg | ORAL_TABLET | ORAL | Status: DC | PRN
  Administered 2015-05-15 – 2015-05-16 (×2): 2 mg via ORAL

## 2015-05-14 MED ORDER — PROPOFOL 10 MG/ML IV BOLUS
INTRAVENOUS | Status: AC
  Filled 2015-05-14: qty 20

## 2015-05-14 MED ORDER — CARVEDILOL 12.5 MG PO TABS
12.5000 mg | ORAL_TABLET | Freq: Two times a day (BID) | ORAL | Status: DC
  Administered 2015-05-15 – 2015-05-19 (×9): 12.5 mg via ORAL
  Filled 2015-05-14 (×14): qty 1

## 2015-05-14 MED ORDER — SUGAMMADEX SODIUM 200 MG/2ML IV SOLN
INTRAVENOUS | Status: AC
  Filled 2015-05-14: qty 2

## 2015-05-14 MED ORDER — LACTATED RINGERS IV SOLN
INTRAVENOUS | Status: DC | PRN
  Administered 2015-05-14: 12:00:00 via INTRAVENOUS

## 2015-05-14 MED ORDER — ROCURONIUM BROMIDE 100 MG/10ML IV SOLN
INTRAVENOUS | Status: DC | PRN
  Administered 2015-05-14: 10 mg via INTRAVENOUS
  Administered 2015-05-14: 40 mg via INTRAVENOUS

## 2015-05-14 MED ORDER — MIDAZOLAM HCL 2 MG/2ML IJ SOLN
INTRAMUSCULAR | Status: AC
  Filled 2015-05-14: qty 2

## 2015-05-14 MED ORDER — PROPOFOL 10 MG/ML IV BOLUS
INTRAVENOUS | Status: DC | PRN
  Administered 2015-05-14: 150 mg via INTRAVENOUS

## 2015-05-14 SURGICAL SUPPLY — 53 items
APPLICATOR COTTON TIP 6IN STRL (MISCELLANEOUS) ×2 IMPLANT
BLADE EXTENDED COATED 6.5IN (ELECTRODE) ×1 IMPLANT
BLADE HEX COATED 2.75 (ELECTRODE) ×2 IMPLANT
BLADE SURG 15 STRL LF DISP TIS (BLADE) ×1 IMPLANT
BLADE SURG 15 STRL SS (BLADE) ×2
BNDG GAUZE ELAST 4 BULKY (GAUZE/BANDAGES/DRESSINGS) ×1 IMPLANT
COVER MAYO STAND STRL (DRAPES) ×1 IMPLANT
COVER SURGICAL LIGHT HANDLE (MISCELLANEOUS) ×4 IMPLANT
DECANTER SPIKE VIAL GLASS SM (MISCELLANEOUS) IMPLANT
DRAPE LAPAROSCOPIC ABDOMINAL (DRAPES) ×2 IMPLANT
DRAPE WARM FLUID 44X44 (DRAPE) ×1 IMPLANT
DRSG MEPITEL 4X7.2 (GAUZE/BANDAGES/DRESSINGS) ×1 IMPLANT
DRSG PAD ABDOMINAL 8X10 ST (GAUZE/BANDAGES/DRESSINGS) ×1 IMPLANT
ELECT PENCIL ROCKER SW 15FT (MISCELLANEOUS) ×2 IMPLANT
ELECT REM PT RETURN 9FT ADLT (ELECTROSURGICAL) ×2
ELECTRODE REM PT RTRN 9FT ADLT (ELECTROSURGICAL) ×1 IMPLANT
GAUZE SPONGE 4X4 12PLY STRL (GAUZE/BANDAGES/DRESSINGS) ×3 IMPLANT
GLOVE BIO SURGEON STRL SZ7.5 (GLOVE) ×2 IMPLANT
GLOVE BIOGEL PI IND STRL 7.0 (GLOVE) ×1 IMPLANT
GLOVE BIOGEL PI INDICATOR 7.0 (GLOVE) ×1
GLOVE SURG ORTHO 8.0 STRL STRW (GLOVE) ×2 IMPLANT
GOWN STRL REUS W/TWL LRG LVL3 (GOWN DISPOSABLE) ×4 IMPLANT
GOWN STRL REUS W/TWL XL LVL3 (GOWN DISPOSABLE) ×4 IMPLANT
HANDLE SUCTION POOLE (INSTRUMENTS) IMPLANT
KIT BASIN OR (CUSTOM PROCEDURE TRAY) ×2 IMPLANT
NDL HYPO 25X1 1.5 SAFETY (NEEDLE) IMPLANT
NEEDLE HYPO 25X1 1.5 SAFETY (NEEDLE) IMPLANT
NS IRRIG 1000ML POUR BTL (IV SOLUTION) ×2 IMPLANT
PACK GENERAL/GYN (CUSTOM PROCEDURE TRAY) ×2 IMPLANT
SPONGE LAP 18X18 X RAY DECT (DISPOSABLE) ×2 IMPLANT
STAPLER VISISTAT 35W (STAPLE) ×2 IMPLANT
SUCTION POOLE HANDLE (INSTRUMENTS)
SUT MNCRL AB 4-0 PS2 18 (SUTURE) IMPLANT
SUT NOV 1 T60/GS (SUTURE) IMPLANT
SUT SILK 2 0 (SUTURE)
SUT SILK 2 0 SH CR/8 (SUTURE) ×1 IMPLANT
SUT SILK 2-0 18XBRD TIE 12 (SUTURE) IMPLANT
SUT SILK 3 0 (SUTURE)
SUT SILK 3 0 SH CR/8 (SUTURE) ×1 IMPLANT
SUT SILK 3-0 18XBRD TIE 12 (SUTURE) IMPLANT
SUT VIC AB 3-0 SH 27 (SUTURE)
SUT VIC AB 3-0 SH 27XBRD (SUTURE) IMPLANT
SUT VICRYL 2 0 18  UND BR (SUTURE)
SUT VICRYL 2 0 18 UND BR (SUTURE) IMPLANT
SWAB COLLECTION DEVICE MRSA (MISCELLANEOUS) IMPLANT
SWAB CULTURE ESWAB REG 1ML (MISCELLANEOUS) IMPLANT
SYR BULB 3OZ (MISCELLANEOUS) ×2 IMPLANT
SYR CONTROL 10ML LL (SYRINGE) IMPLANT
TOWEL OR 17X26 10 PK STRL BLUE (TOWEL DISPOSABLE) ×4 IMPLANT
TRAY FOLEY W/METER SILVER 14FR (SET/KITS/TRAYS/PACK) IMPLANT
TRAY FOLEY W/METER SILVER 16FR (SET/KITS/TRAYS/PACK) ×1 IMPLANT
WATER STERILE IRR 1500ML POUR (IV SOLUTION) ×2 IMPLANT
YANKAUER SUCT BULB TIP NO VENT (SUCTIONS) ×2 IMPLANT

## 2015-05-14 NOTE — Transfer of Care (Signed)
Immediate Anesthesia Transfer of Care Note  Patient: Derek Mohr Sr.  Procedure(s) Performed: Procedure(s): EXPLORATORY LAPAROTOMY (N/A) DRAINAGE OF INFECTED PANCREATIC PSEUDOCYST (N/A)  Patient Location: PACU  Anesthesia Type:General  Level of Consciousness:  sedated, patient cooperative and responds to stimulation  Airway & Oxygen Therapy:Patient Spontanous Breathing and Patient connected to face mask oxgen  Post-op Assessment:  Report given to PACU RN and Post -op Vital signs reviewed and stable  Post vital signs:  Reviewed and stable  Last Vitals:  Filed Vitals:   05/14/15 1008 05/14/15 1305  BP: 120/75   Pulse: 87   Temp: 37.3 C 36.8 C  Resp: 18     Complications: No apparent anesthesia complications

## 2015-05-14 NOTE — Progress Notes (Addendum)
Pharmacy Antibiotic Note  Derek [name]. is a 70 y.o. male admitted on 05/13/2015 with infected pancreatic pseudocyst.  Pharmacy has been consulted for Zosyn dosing. 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 Post op course complicated by abscess and pseudocyst formation. Multiple manipulations of percutaneous drains have not been successful in resolving the infected pseudocyst. Repeat CT today shows no further progress. Patient complains of pain, nausea, and emesis. Pt was on  oral Bactrim, and now is being transitioned to Zosyn  Plan: D2 Abx - noted elevated SCr OR drainage of pseudocyst planned today Continue Zosyn 3.375g IV q8h (infuse over 4 hours) Monitor renal fxn, cultures, clinical course  Temp (24hrs), Avg:98.3 F (36.8 C), Min:97.7 F (36.5 C), Max:99.1 F (37.3 C)   Recent Labs Lab 05/13/15 1639 05/14/15 0431  WBC 16.1* 17.1*  CREATININE 1.44*  --     CrCl cannot be calculated (Unknown ideal weight.).    Allergies  Allergen Reactions  . Morphine And Related Nausea And Vomiting  . Quinine Other (See Comments)    Platelets dropped  . Voltaren [Diclofenac Sodium] Other (See Comments)    Elevated liver enzymes  . Oxycontin [Oxycodone Hcl] Swelling    Lips swells. Tolerates immediate-release oxycodone as well as hydrocodone.    Antimicrobials this admission: Zosyn 3/23 >>    Dose adjustments this admission: ----  Microbiology results: 3/23 MRSA PCR negative 3/23 Staph PCR negative  Thank you for allowing pharmacy to be a part of this patient's care.  Ralene Bathe, PharmD, BCPS 05/14/2015, 7:58 AM  Pager: [phone]

## 2015-05-14 NOTE — Anesthesia Postprocedure Evaluation (Signed)
Anesthesia Post Note  Patient: Derek GROSSE Sr.  Procedure(s) Performed: Procedure(s) (LRB): EXPLORATORY LAPAROTOMY (N/A) DRAINAGE OF INFECTED PANCREATIC PSEUDOCYST (N/A)  Patient location during evaluation: PACU Anesthesia Type: General Level of consciousness: awake and alert, oriented and patient cooperative Pain management: pain level controlled Vital Signs Assessment: post-procedure vital signs reviewed and stable Respiratory status: spontaneous breathing, nonlabored ventilation, respiratory function stable and patient connected to nasal cannula oxygen Cardiovascular status: blood pressure returned to baseline and stable Postop Assessment: no signs of nausea or vomiting Anesthetic complications: no    Last Vitals:  Filed Vitals:   05/14/15 1601 05/14/15 1653  BP: 120/74 93/53  Pulse: 79 97  Temp: 36.8 C 36.8 C  Resp: 18 18    Last Pain:  Filed Vitals:   05/14/15 1654  PainSc: 10-Worst pain ever                 Justice Aguirre,E. Kirstin Kugler

## 2015-05-14 NOTE — Progress Notes (Signed)
Inpatient Diabetes Program Recommendations  AACE/ADA: New Consensus Statement on Inpatient Glycemic Control (2015)  Target Ranges:  Prepandial:   less than 140 mg/dL      Peak postprandial:   less than 180 mg/dL (1-2 hours)      Critically ill patients:  140 - 180 mg/dL   Results for ANA, KLEBE SR. (MRN [id number]) as of 05/14/2015 12:38  Ref. Range 05/13/2015 23:52 05/14/2015 03:28 05/14/2015 07:55 05/14/2015 11:37  Glucose-Capillary Latest Ref Range: 65-99 mg/dL 171 (H) 186 (H) 192 (H) 190 (H)     Admit with: Infected pancreatic pseudocyst  History: DM  Home DM Meds: Lantus 24 units QHS       Novolog 12 units tidwc  Current Insulin Orders: Novolog Moderate Correction Scale/ SSI (0-15 units) Q4 hours      MD- Please consider starting 50% of patient's home dose of Lantus-  Lantus 12 units QHS     --Will follow patient during hospitalization--  Wyn Quaker RN, MSN, CDE Diabetes Coordinator Inpatient Glycemic Control Team Team Pager: [phone] (8a-5p)

## 2015-05-14 NOTE — Brief Op Note (Signed)
05/13/2015 - 05/14/2015  12:52 PM  PATIENT:  Derek Mohr Sr.  70 y.o. male  PRE-OPERATIVE DIAGNOSIS:  infected pancreatic pseudocyst  POST-OPERATIVE DIAGNOSIS:  infected pancreatic pseudocyst  PROCEDURE:  Procedure(s): EXPLORATORY LAPAROTOMY (N/A) DRAINAGE OF INFECTED PANCREATIC PSEUDOCYST (N/A)  SURGEON:  Surgeon(s) and Role:    * Armandina Gemma, MD - Primary  ANESTHESIA:   general  EBL:  Total I/O In: -  Out: 550 [Urine:400; Other:150]  BLOOD ADMINISTERED:none  DRAINS: open packing with Kerlix gauze   LOCAL MEDICATIONS USED:  NONE  SPECIMEN:  No Specimen  DISPOSITION OF SPECIMEN:  N/A  COUNTS:  YES  TOURNIQUET:  * No tourniquets in log *  DICTATION: .Other Dictation: Dictation Number [id number]  PLAN OF CARE: Admit to inpatient   PATIENT DISPOSITION:  PACU - hemodynamically stable.   Delay start of Pharmacological VTE agent (>24hrs) due to surgical blood loss or risk of bleeding: yes  [name], MD, [facility] Office: [phone]

## 2015-05-14 NOTE — Progress Notes (Signed)
Utilization review completed.  L. J. Haile Bosler RN, BSN, CM 

## 2015-05-14 NOTE — Op Note (Signed)
NAMETHURMOND, FAMIGLIETTI NO.:  [id number]  MEDICAL RECORD NO.:  [id number]  LOCATION:  [facility]                         FACILITY:  [facility]  PHYSICIAN:  [name], MD      DATE OF BIRTH:  July 25, 1945  DATE OF PROCEDURE:  05/14/2015                              OPERATIVE REPORT   PREOPERATIVE DIAGNOSIS:  Infected pancreatic pseudocyst.  POSTOPERATIVE DIAGNOSIS:  Infected pancreatic pseudocyst.  PROCEDURE:  Exploratory laparotomy, drainage of infected pancreatic pseudocyst, open packing.  SURGEON:  [name], MD, FACS  ANESTHESIA:  General per Dr. Annye Asa.  ESTIMATED BLOOD LOSS:  Minimal.  PREPARATION:  Betadine.  COMPLICATIONS:  None.  INDICATIONS:  The patient is a 70 year old white male, who underwent distal pancreatectomy and splenectomy for neuroendocrine tumor of the pancreas in early January 2017.  Postoperative course was complicated by probable pseudocyst formation and abscess formation in the left upper quadrant of the abdomen.  The patient was managed with percutaneous drainage and antibiotics without success.  The patient now comes to the operating room for operative drainage.  BODY OF REPORT:  Procedure was done in OR #2 at the [facility].  The patient was brought to the operating room, placed in supine position on the operating room table.  Following administration of general anesthesia, the patient was prepped and draped in the usual aseptic fashion.  After ascertaining that an adequate level of anesthesia had been achieved, the open wound in the left subcostal position was explored with a Kelly clamp.  There was a sinus tract which goes into the abscess cavity.  Using this tract as a guide, the skin was incised and the subcutaneous tissues and muscle wall opened allowing access to the abscess cavity.  Care was taken to avoid the colon which was visible laterally and inferiorly to the incision.  Abscess  cavity was entered.  There was a large amount of necrotic debris present within the cavity.  This was extracted.  The anterior drain was visible.  It was divided at the skin and removed in its entirety.  The cavity was irrigated with saline.  Exploration of the cavity revealed its medial extent, but not the deepest portion as this tunnels further then can easily be accessed.  The second percutaneous drain was also removed in its entirety.  Cavity was irrigated copiously with warm saline and all necrotic debris was removed.  Good hemostasis was noted.  Fascia in the lateral wound was closed over the underlying colon with interrupted #1 Novafil suture. Cavity was then packed with Betadine-soaked 4 inch Kerlix gauze packing. Mepitel was placed over the lateral aspect of the wound to protect the underlying colon.  Wound was then packed up to the level of the skin with the Betadine-soaked Kerlix.  Dry gauze dressings and ABD pads were applied.  The patient was awakened from anesthesia and brought to the recovery room.  The patient tolerated the procedure well.   [name], MD, [facility] Office: [phone]    TMG/MEDQ  D:  05/14/2015  T:  05/14/2015  Job:  [id number]

## 2015-05-14 NOTE — Anesthesia Procedure Notes (Signed)
Procedure Name: Intubation Date/Time: 05/14/2015 11:54 AM Performed by: West Pugh Pre-anesthesia Checklist: Patient identified, Emergency Drugs available, Suction available, Patient being monitored and Timeout performed Patient Re-evaluated:Patient Re-evaluated prior to inductionOxygen Delivery Method: Circle system utilized Preoxygenation: Pre-oxygenation with 100% oxygen Intubation Type: IV induction Ventilation: Mask ventilation without difficulty Laryngoscope Size: Mac and 3 Grade View: Grade I Tube type: Oral Number of attempts: 1 Airway Equipment and Method: Stylet Placement Confirmation: ETT inserted through vocal cords under direct vision,  positive ETCO2,  CO2 detector and breath sounds checked- equal and bilateral Secured at: 22 cm Tube secured with: Tape Dental Injury: Teeth and Oropharynx as per pre-operative assessment

## 2015-05-14 NOTE — Anesthesia Preprocedure Evaluation (Addendum)
Anesthesia Evaluation  Patient identified by MRN, date of birth, ID band Patient awake    Reviewed: Allergy & Precautions, NPO status , Patient's Chart, lab work & pertinent test results, reviewed documented beta blocker date and time   History of Anesthesia Complications Negative for: history of anesthetic complications  Airway Mallampati: II  TM Distance: >3 FB Neck ROM: Full    Dental  (+) Dental Advisory Given, Loose, Missing, Caps   Pulmonary sleep apnea (has never needed CPAP) , former smoker (quit 1977),    breath sounds clear to auscultation       Cardiovascular hypertension, Pt. on medications and Pt. on home beta blockers  Rhythm:Regular Rate:Normal     Neuro/Psych Anxiety Depression meniere's    GI/Hepatic Neg liver ROS, GERD  Medicated and Controlled,Neuroendocrine tumor of pancreas   Endo/Other  diabetes, Insulin Dependent, Oral Hypoglycemic Agents  Renal/GU negative Renal ROS     Musculoskeletal  (+) Arthritis ,   Abdominal   Peds  Hematology  (+) Blood dyscrasia (Factor V Leiden), ,   Anesthesia Other Findings   Reproductive/Obstetrics                           Anesthesia Physical Anesthesia Plan  ASA: III  Anesthesia Plan: General   Post-op Pain Management:    Induction: Intravenous  Airway Management Planned: Oral ETT  Additional Equipment:   Intra-op Plan:   Post-operative Plan: Extubation in OR  Informed Consent: I have reviewed the patients History and Physical, chart, labs and discussed the procedure including the risks, benefits and alternatives for the proposed anesthesia with the patient or authorized representative who has indicated his/her understanding and acceptance.   Dental advisory given  Plan Discussed with: CRNA and Surgeon  Anesthesia Plan Comments: (Plan routine monitors, GETA)        Anesthesia Quick Evaluation

## 2015-05-15 LAB — GLUCOSE, CAPILLARY
Glucose-Capillary: 180 mg/dL — ABNORMAL HIGH (ref 65–99)
Glucose-Capillary: 192 mg/dL — ABNORMAL HIGH (ref 65–99)
Glucose-Capillary: 193 mg/dL — ABNORMAL HIGH (ref 65–99)
Glucose-Capillary: 204 mg/dL — ABNORMAL HIGH (ref 65–99)
Glucose-Capillary: 235 mg/dL — ABNORMAL HIGH (ref 65–99)
Glucose-Capillary: 334 mg/dL — ABNORMAL HIGH (ref 65–99)

## 2015-05-15 NOTE — Progress Notes (Signed)
Reddened area approximately 1" x 2" but not symmetrical to bottom of right foot noted.  Pt states he presses against the end of the bed to help ease his back pain

## 2015-05-15 NOTE — Progress Notes (Signed)
Patient ID: Derek Mohr Sr., male   DOB: [birth year], 70 y.o.   MRN: [id number]  [facility]  Subjective: Patient in bed, feels better.  Some drainage from anterior wound.  Tolerating diet.  Objective: Vital signs in last 24 hours: Temp:  [97.5 F (36.4 C)-99.2 F (37.3 C)] 97.5 F (36.4 C) (03/25 0520) Pulse Rate:  [75-97] 81 (03/25 0520) Resp:  [13-18] 18 (03/25 0520) BP: (93-143)/(53-93) 109/58 mmHg (03/25 0520) SpO2:  [95 %-100 %] 95 % (03/25 0520) Weight:  [92.942 kg (204 lb 14.4 oz)] 92.942 kg (204 lb 14.4 oz) (03/24 0822) Last BM Date: 05/11/15  Intake/Output from previous day: 03/24 0701 - 03/25 0700 In: 3150 [I.V.:3100; IV Piggyback:50] Out: 1927 [Urine:1775; Blood:2] Intake/Output this shift:    Physical Exam: HEENT - sclerae clear, mucous membranes moist Abdomen - soft, moderate brownish drainage from anterior wound; lateral sites dry Ext - no edema, non-tender Neuro - alert & oriented, no focal deficits  Lab Results:   Recent Labs  05/13/15 1639 05/14/15 0431  WBC 16.1* 17.1*  HGB 10.1* 10.3*  HCT 32.1* 32.0*  PLT 911* 789*   BMET  Recent Labs  05/13/15 1639  NA 130*  K 4.7  CL 95*  CO2 24  GLUCOSE 204*  BUN 16  CREATININE 1.44*  CALCIUM 12.5*   PT/INR  Recent Labs  05/13/15 1639 05/14/15 0431  LABPROT 14.6 15.5*  INR 1.17 1.27   Comprehensive Metabolic Panel:    Component Value Date/Time   NA 130* 05/13/2015 1639   NA 138 04/06/2015 0528   K 4.7 05/13/2015 1639   K 3.4* 04/06/2015 0528   CL 95* 05/13/2015 1639   CL 103 04/06/2015 0528   CO2 24 05/13/2015 1639   CO2 27 04/06/2015 0528   BUN 16 05/13/2015 1639   BUN 13 04/06/2015 0528   CREATININE 1.44* 05/13/2015 1639   CREATININE 1.25* 04/06/2015 0528   GLUCOSE 204* 05/13/2015 1639   GLUCOSE 105* 04/06/2015 0528   CALCIUM 12.5* 05/13/2015 1639   CALCIUM 9.5 04/06/2015 0528   AST 14* 05/13/2015 1639   AST 19 04/05/2015 1155   ALT  9* 05/13/2015 1639   ALT 12* 04/05/2015 1155   ALKPHOS 109 05/13/2015 1639   ALKPHOS 97 04/05/2015 1155   BILITOT 0.2* 05/13/2015 1639   BILITOT 0.6 04/05/2015 1155   PROT 8.6* 05/13/2015 1639   PROT 5.8* 04/05/2015 1155   ALBUMIN 3.4* 05/13/2015 1639   ALBUMIN 2.0* 04/05/2015 1155    Studies/Results: Ct Abdomen Pelvis W Contrast  05/13/2015  CLINICAL DATA:  Abscess EXAM: CT ABDOMEN AND PELVIS WITH CONTRAST TECHNIQUE: Multidetector CT imaging of the abdomen and pelvis was performed using the standard protocol following bolus administration of intravenous contrast. CONTRAST:  189mL ISOVUE-300 IOPAMIDOL (ISOVUE-300) INJECTION 61% COMPARISON:  05/04/2015 FINDINGS: A larger catheter, a large bore Thal quick drain, is now present in the left upper quadrant abscess. The abscess is stable in size measuring 7.7 by 8.9 cm on image 24. The fistula track extending from the abscess through the abdominal mall loss culture and to the skin is established. A left subdiaphragmatic abscess contains a pigtail catheter. The catheter is stable in position. There are gas bubbles within the abscess cavity. It is relatively contracted and stable in appearance compared to the prior study. A communication to the larger abscess is identified. There is thickening of the wall of the antrum and proximal duodenum, with adjacent inflammatory change. Stranding extends to  the head of the pancreas. Moderate-sized hiatal hernia. Stable left basilar dependent atelectasis or scar. Post splenectomy and partial distal pancreatectomy. Liver, gallbladder, adrenal glands are within normal limits Stable appearance of the kidneys with hypodensities. Prominent stool burden in the proximal colon. Stable left para-aortic lymph nodes. Right pelvic bladder diverticulum is noted. Bladder is distended. Prostate is unremarkable There is no free fluid. IMPRESSION: Pigtail drain in the left upper quadrant abscess has been exchanged for a large bore Thal  quick drain. Abscess cavity is stable in size. Fistulous communication to the skin is established. Smaller left subdiaphragmatic abscess is stable and relatively contracted about a pigtail catheter. It does communicate with the larger abscess. Inflammatory changes involving the antrum of the stomach and pancreatic head may be present. Electronically Signed   By: Marybelle Killings M.D.   On: 05/13/2015 14:14    Assessment & Plans: Infected pancreatic pseudocyst - marsupialized  Continue IV Zosyn  Wound care as instructed  Will return to OR tomorrow (3/26) for dressing change and possible VAC placement  [name], MD, [facility] Office: [place] 05/15/2015

## 2015-05-16 ENCOUNTER — Inpatient Hospital Stay (HOSPITAL_COMMUNITY): Payer: PPO | Admitting: Certified Registered Nurse Anesthetist

## 2015-05-16 ENCOUNTER — Encounter (HOSPITAL_COMMUNITY)
Admission: AD | Disposition: A | Discharge status: Home / Self Care | Payer: Self-pay | Source: Ambulatory Visit | Attending: Surgery

## 2015-05-16 ENCOUNTER — Encounter (HOSPITAL_COMMUNITY): Payer: Self-pay

## 2015-05-16 DIAGNOSIS — S31609A Unspecified open wound of abdominal wall, unspecified quadrant with penetration into peritoneal cavity, initial encounter: Secondary | ICD-10-CM | POA: Diagnosis not present

## 2015-05-16 DIAGNOSIS — N189 Chronic kidney disease, unspecified: Secondary | ICD-10-CM | POA: Diagnosis not present

## 2015-05-16 DIAGNOSIS — K219 Gastro-esophageal reflux disease without esophagitis: Secondary | ICD-10-CM | POA: Diagnosis not present

## 2015-05-16 DIAGNOSIS — I10 Essential (primary) hypertension: Secondary | ICD-10-CM | POA: Diagnosis not present

## 2015-05-16 DIAGNOSIS — K863 Pseudocyst of pancreas: Secondary | ICD-10-CM | POA: Diagnosis not present

## 2015-05-16 DIAGNOSIS — E119 Type 2 diabetes mellitus without complications: Secondary | ICD-10-CM | POA: Diagnosis not present

## 2015-05-16 HISTORY — PX: INCISION AND DRAINAGE OF WOUND: SHX1803

## 2015-05-16 HISTORY — PX: APPLICATION OF WOUND VAC: SHX5189

## 2015-05-16 LAB — GLUCOSE, CAPILLARY
Glucose-Capillary: 145 mg/dL — ABNORMAL HIGH (ref 65–99)
Glucose-Capillary: 151 mg/dL — ABNORMAL HIGH (ref 65–99)
Glucose-Capillary: 147 mg/dL — ABNORMAL HIGH (ref 65–99)
Glucose-Capillary: 201 mg/dL — ABNORMAL HIGH (ref 65–99)
Glucose-Capillary: 211 mg/dL — ABNORMAL HIGH (ref 65–99)

## 2015-05-16 SURGERY — IRRIGATION AND DEBRIDEMENT WOUND
Anesthesia: General | Site: Abdomen

## 2015-05-16 MED ORDER — PROPOFOL 10 MG/ML IV BOLUS
INTRAVENOUS | Status: DC | PRN
  Administered 2015-05-16: 140 mg via INTRAVENOUS

## 2015-05-16 MED ORDER — MIDAZOLAM HCL 2 MG/2ML IJ SOLN: 0.5000 mg | Freq: Once | INTRAMUSCULAR | Status: DC | PRN

## 2015-05-16 MED ORDER — PROMETHAZINE HCL 25 MG/ML IJ SOLN: 6.2500 mg | INTRAMUSCULAR | Status: DC | PRN

## 2015-05-16 MED ORDER — MEPERIDINE HCL 50 MG/ML IJ SOLN: 6.2500 mg | INTRAMUSCULAR | Status: DC | PRN

## 2015-05-16 MED ORDER — 0.9 % SODIUM CHLORIDE (POUR BTL) OPTIME
TOPICAL | Status: DC | PRN
  Administered 2015-05-16: 2000 mL

## 2015-05-16 MED ORDER — SUCCINYLCHOLINE CHLORIDE 20 MG/ML IJ SOLN
INTRAMUSCULAR | Status: DC | PRN
  Administered 2015-05-16: 80 mg via INTRAVENOUS

## 2015-05-16 MED ORDER — LIP MEDEX EX OINT
TOPICAL_OINTMENT | CUTANEOUS | Status: AC
  Filled 2015-05-16: qty 7

## 2015-05-16 MED ORDER — FENTANYL CITRATE (PF) 100 MCG/2ML IJ SOLN
INTRAMUSCULAR | Status: AC
  Filled 2015-05-16: qty 2

## 2015-05-16 MED ORDER — PROPOFOL 10 MG/ML IV BOLUS
INTRAVENOUS | Status: AC
  Filled 2015-05-16: qty 40

## 2015-05-16 MED ORDER — HYDROMORPHONE HCL 1 MG/ML IJ SOLN
0.2500 mg | INTRAMUSCULAR | Status: DC | PRN
  Administered 2015-05-16 (×2): 0.5 mg via INTRAVENOUS

## 2015-05-16 MED ORDER — EPHEDRINE SULFATE 50 MG/ML IJ SOLN
INTRAMUSCULAR | Status: DC | PRN
  Administered 2015-05-16 (×2): 10 mg via INTRAVENOUS

## 2015-05-16 MED ORDER — LACTATED RINGERS IV SOLN
INTRAVENOUS | Status: DC | PRN
  Administered 2015-05-16: 08:00:00 via INTRAVENOUS

## 2015-05-16 MED ORDER — LIDOCAINE HCL (CARDIAC) 20 MG/ML IV SOLN
INTRAVENOUS | Status: DC | PRN
  Administered 2015-05-16: 80 mg via INTRAVENOUS

## 2015-05-16 MED ORDER — HYDROMORPHONE HCL 1 MG/ML IJ SOLN
INTRAMUSCULAR | Status: AC
  Filled 2015-05-16: qty 1

## 2015-05-16 MED ORDER — FENTANYL CITRATE (PF) 100 MCG/2ML IJ SOLN
INTRAMUSCULAR | Status: DC | PRN
  Administered 2015-05-16 (×2): 50 ug via INTRAVENOUS

## 2015-05-16 SURGICAL SUPPLY — 34 items
APL SKNCLS STERI-STRIP NONHPOA (GAUZE/BANDAGES/DRESSINGS)
BENZOIN TINCTURE PRP APPL 2/3 (GAUZE/BANDAGES/DRESSINGS) IMPLANT
BLADE HEX COATED 2.75 (ELECTRODE) ×2 IMPLANT
BLADE SURG SZ10 CARB STEEL (BLADE) ×2 IMPLANT
COVER SURGICAL LIGHT HANDLE (MISCELLANEOUS) ×2 IMPLANT
DECANTER SPIKE VIAL GLASS SM (MISCELLANEOUS) IMPLANT
DRAIN CHANNEL RND F F (WOUND CARE) IMPLANT
DRAPE LAPAROTOMY T 102X78X121 (DRAPES) IMPLANT
DRAPE LAPAROTOMY TRNSV 102X78 (DRAPE) IMPLANT
DRAPE SHEET LG 3/4 BI-LAMINATE (DRAPES) IMPLANT
DRSG VAC ATS MED SENSATRAC (GAUZE/BANDAGES/DRESSINGS) ×1 IMPLANT
ELECT REM PT RETURN 9FT ADLT (ELECTROSURGICAL) ×2
ELECTRODE REM PT RTRN 9FT ADLT (ELECTROSURGICAL) ×1 IMPLANT
EVACUATOR SILICONE 100CC (DRAIN) IMPLANT
GAUZE SPONGE 4X4 12PLY STRL (GAUZE/BANDAGES/DRESSINGS) ×2 IMPLANT
GLOVE BIOGEL PI IND STRL 7.0 (GLOVE) ×1 IMPLANT
GLOVE BIOGEL PI INDICATOR 7.0 (GLOVE) ×1
GLOVE SURG ORTHO 8.0 STRL STRW (GLOVE) ×2 IMPLANT
GOWN STRL REUS W/TWL LRG LVL3 (GOWN DISPOSABLE) ×2 IMPLANT
GOWN STRL REUS W/TWL XL LVL3 (GOWN DISPOSABLE) ×4 IMPLANT
KIT BASIN OR (CUSTOM PROCEDURE TRAY) ×2 IMPLANT
MARKER SKIN DUAL TIP RULER LAB (MISCELLANEOUS) IMPLANT
NDL HYPO 25X1 1.5 SAFETY (NEEDLE) ×1 IMPLANT
NEEDLE HYPO 25X1 1.5 SAFETY (NEEDLE) ×2 IMPLANT
NS IRRIG 1000ML POUR BTL (IV SOLUTION) ×2 IMPLANT
PACK GENERAL/GYN (CUSTOM PROCEDURE TRAY) ×2 IMPLANT
SPONGE LAP 18X18 X RAY DECT (DISPOSABLE) IMPLANT
STAPLER VISISTAT 35W (STAPLE) IMPLANT
STRIP CLOSURE SKIN 1/2X4 (GAUZE/BANDAGES/DRESSINGS) IMPLANT
SUT ETHILON 3 0 PS 1 (SUTURE) IMPLANT
SUT MNCRL AB 4-0 PS2 18 (SUTURE) IMPLANT
SUT VIC AB 3-0 SH 18 (SUTURE) IMPLANT
SYR CONTROL 10ML LL (SYRINGE) ×2 IMPLANT
TOWEL OR 17X26 10 PK STRL BLUE (TOWEL DISPOSABLE) ×2 IMPLANT

## 2015-05-16 NOTE — Anesthesia Procedure Notes (Signed)
Procedure Name: Intubation Performed by: Chantalle Defilippo J Pre-anesthesia Checklist: Patient identified, Emergency Drugs available, Suction available, Patient being monitored and Timeout performed Patient Re-evaluated:Patient Re-evaluated prior to inductionOxygen Delivery Method: Circle system utilized Preoxygenation: Pre-oxygenation with 100% oxygen (RSI) Intubation Type: IV induction Laryngoscope Size: Mac and 4 Grade View: Grade I Tube type: Oral Tube size: 7.5 mm Number of attempts: 1 Airway Equipment and Method: Stylet Placement Confirmation: ETT inserted through vocal cords under direct vision,  positive ETCO2,  CO2 detector and breath sounds checked- equal and bilateral Secured at: 23 cm Tube secured with: Tape Dental Injury: Teeth and Oropharynx as per pre-operative assessment      

## 2015-05-16 NOTE — Brief Op Note (Signed)
05/13/2015 - 05/16/2015  8:22 AM  PATIENT:  Derek Mohr Sr.  70 y.o. male  PRE-OPERATIVE DIAGNOSIS:  Infected pancreatic pseudocyst  POST-OPERATIVE DIAGNOSIS:  same  PROCEDURE:  Procedure(s): IRRIGATION AND DEBRIDEMENT WOUND (N/A) APPLICATION OF WOUND VAC (N/A)  SURGEON:  Surgeon(s) and Role:    * Armandina Gemma, MD - Primary  ANESTHESIA:   general  EBL:  Total I/O In: -  Out: 410 [Urine:400; Blood:10]  BLOOD ADMINISTERED:none  DRAINS: VAC system with black and white sponges LUQ wound   LOCAL MEDICATIONS USED:  NONE  SPECIMEN:  No Specimen  DISPOSITION OF SPECIMEN:  N/A  COUNTS:  YES  TOURNIQUET:  * No tourniquets in log *  DICTATION: .Other Dictation: Dictation Number [id number]  PLAN OF CARE: Admit to inpatient   PATIENT DISPOSITION:  PACU - hemodynamically stable.   Delay start of Pharmacological VTE agent (>24hrs) due to surgical blood loss or risk of bleeding: yes  [name], MD, [facility] Office: [phone]

## 2015-05-16 NOTE — Op Note (Signed)
NAMEGLYNN, NGAI NO.:  [id number]  MEDICAL RECORD NO.:  [id number]  LOCATION:  [facility]                         FACILITY:  [facility]  PHYSICIAN:  [name], MD      DATE OF BIRTH:  11-28-1945  DATE OF PROCEDURE:  05/16/2015                              OPERATIVE REPORT   PREOPERATIVE DIAGNOSIS:  Infected pancreatic pseudocyst.  POSTOPERATIVE DIAGNOSIS:  Infected pancreatic pseudocyst.  PROCEDURE:  Dressing change under anesthesia, application of VAC dressing to left upper quadrant wound.  SURGEON:  [name], MD  ANESTHESIA:  General per Dr. Annye Asa.  ESTIMATED BLOOD LOSS:  Minimal.  PREPARATION:  Betadine.  COMPLICATIONS:  None.  INDICATIONS:  The patient is a 70 year old male being treated for infected pancreatic pseudocyst.  The patient underwent exploratory laparotomy with marsupialization of the pseudocyst 2 days ago.  He now returns to the operating room for dressing change and application of VAC dressing.  BODY OF REPORT:  Procedure was done in OR #1 at the [facility].  The patient was brought to the operating room, placed in supine position on the operating room table.  Following administration of general anesthesia, the patient was prepped and draped in the usual aseptic fashion.  Previous packing material had been completely removed.  Wound appears clean.  There was early granulation tissue.  The partial fascial closure is intact and the underlying colon is no longer visible in the wound.  Wound was irrigated copiously with warm saline.  This was evacuated.  A vacuum dressing was then applied with both black and white sponges.  White sponge was placed adjacent to the colon.  The vacuum dressing was sealed in the usual fashion and placed to suction with good seal.  The patient was awakened from anesthesia and brought to the recovery room.  The patient tolerated the procedure well.  [name], MD,  [facility] Office: [phone]   TMG/MEDQ  D:  05/16/2015  T:  05/16/2015  Job:  [id number]

## 2015-05-16 NOTE — Transfer of Care (Signed)
Immediate Anesthesia Transfer of Care Note  Patient: Derek [name].  Procedure(s) Performed: Procedure(s): IRRIGATION AND DEBRIDEMENT WOUND (N/A) APPLICATION OF WOUND VAC (N/A)  Patient Location: PACU  Anesthesia Type:General  Level of Consciousness: awake, alert  and oriented  Airway & Oxygen Therapy: Patient Spontanous Breathing and Patient connected to face mask oxygen  Post-op Assessment: Report given to RN and Post -op Vital signs reviewed and stable  Post vital signs: Reviewed and stable  Last Vitals:  Filed Vitals:   05/16/15 0149 05/16/15 0411  BP: 107/48 105/57  Pulse: 69 70  Temp: 36.7 C 36.7 C  Resp: 16 15    Complications: No apparent anesthesia complications

## 2015-05-16 NOTE — Anesthesia Preprocedure Evaluation (Signed)
Anesthesia Evaluation   Patient awake    Reviewed: Allergy & Precautions, NPO status , Patient's Chart, lab work & pertinent test results  History of Anesthesia Complications Negative for: history of anesthetic complications  Airway Mallampati: II  TM Distance: >3 FB Neck ROM: Full    Dental  (+) Dental Advisory Given   Pulmonary sleep apnea (never needed CPAP) , former smoker (quit 1977),    breath sounds clear to auscultation       Cardiovascular hypertension, Pt. on medications and Pt. 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), Insulin Dependent  Renal/GU Renal InsufficiencyRenal disease (creat 1.44)     Musculoskeletal  (+) Arthritis ,   Abdominal   Peds  Hematology  (+) Blood dyscrasia (Factor V Leiden), ,   Anesthesia Other Findings   Reproductive/Obstetrics                             Anesthesia Physical Anesthesia Plan  ASA: III  Anesthesia Plan: General   Post-op Pain Management:    Induction: Intravenous and Rapid sequence  Airway Management Planned: Oral ETT  Additional Equipment:   Intra-op Plan:   Post-operative Plan: Extubation in OR  Informed Consent: I have reviewed the patients History and Physical, chart, labs and discussed the procedure including the risks, benefits and alternatives for the proposed anesthesia with the patient or authorized representative who has indicated his/her understanding and acceptance.   Dental advisory given  Plan Discussed with: CRNA and Surgeon  Anesthesia Plan Comments: (Plan routine monitors, GETA with RSI)        Anesthesia Quick Evaluation

## 2015-05-16 NOTE — Anesthesia Postprocedure Evaluation (Signed)
Anesthesia Post Note  Patient: Derek WERITO Sr.  Procedure(s) Performed: Procedure(s) (LRB): IRRIGATION AND DEBRIDEMENT WOUND (N/A) APPLICATION OF WOUND VAC (N/A)  Patient location during evaluation: PACU Anesthesia Type: General Level of consciousness: awake and alert, oriented and patient cooperative Pain management: pain level controlled Vital Signs Assessment: post-procedure vital signs reviewed and stable Respiratory status: spontaneous breathing, nonlabored ventilation, respiratory function stable and patient connected to nasal cannula oxygen Cardiovascular status: blood pressure returned to baseline and stable Postop Assessment: no signs of nausea or vomiting Anesthetic complications: no    Last Vitals:  Filed Vitals:   05/16/15 0845 05/16/15 0900  BP: 122/65 129/69  Pulse: 73 72  Temp:    Resp: 15 13    Last Pain:  Filed Vitals:   05/16/15 0909  PainSc: 5                  Karan Inclan,E. Nester Bachus

## 2015-05-17 ENCOUNTER — Encounter (HOSPITAL_COMMUNITY): Payer: Self-pay | Admitting: Surgery

## 2015-05-17 ENCOUNTER — Ambulatory Visit: Payer: Self-pay | Admitting: Surgery

## 2015-05-17 LAB — GLUCOSE, CAPILLARY
Glucose-Capillary: 143 mg/dL — ABNORMAL HIGH (ref 65–99)
Glucose-Capillary: 190 mg/dL — ABNORMAL HIGH (ref 65–99)
Glucose-Capillary: 203 mg/dL — ABNORMAL HIGH (ref 65–99)
Glucose-Capillary: 206 mg/dL — ABNORMAL HIGH (ref 65–99)
Glucose-Capillary: 250 mg/dL — ABNORMAL HIGH (ref 65–99)
Glucose-Capillary: 283 mg/dL — ABNORMAL HIGH (ref 65–99)
Glucose-Capillary: 341 mg/dL — ABNORMAL HIGH (ref 65–99)

## 2015-05-17 LAB — PATHOLOGIST SMEAR REVIEW

## 2015-05-17 NOTE — Progress Notes (Signed)
Patient ID: Derek Mohr Sr., male   DOB: 08/04/1945, 70 y.o.   MRN: [id number]  [facility]  Subjective: Patient sleeping, arouses easily.  Ordering lunch.  No complaints.  Anticipating discharge home today with VAC dressing.  Objective: Vital signs in last 24 hours: Temp:  [97.6 F (36.4 C)-98.7 F (37.1 C)] 98.2 F (36.8 C) (03/27 0525) Pulse Rate:  [68-84] 78 (03/27 0525) Resp:  [16-20] 18 (03/27 0525) BP: (103-117)/(51-75) 110/70 mmHg (03/27 1000) SpO2:  [98 %-100 %] 98 % (03/27 0525) Last BM Date: 05/11/15  Intake/Output from previous day: 03/26 0701 - 03/27 0700 In: 3543.3 [P.O.:960; I.V.:2483.3; IV Piggyback:100] Out: 1985 [Urine:1975; Blood:10] Intake/Output this shift: Total I/O In: 240 [P.O.:240] Out: 300 [Urine:300]  Physical Exam: HEENT - sclerae clear, mucous membranes moist Neck - soft Abdomen - soft, VAC dressing intact LUQ, moderate tan output in pump Ext - no edema, non-tender Neuro - alert & oriented, no focal deficits  Lab Results:  No results for input(s): WBC, HGB, HCT, PLT in the last 72 hours. BMET No results for input(s): NA, K, CL, CO2, GLUCOSE, BUN, CREATININE, CALCIUM in the last 72 hours. PT/INR No results for input(s): LABPROT, INR in the last 72 hours. Comprehensive Metabolic Panel:    Component Value Date/Time   NA 130* 05/13/2015 1639   NA 138 04/06/2015 0528   K 4.7 05/13/2015 1639   K 3.4* 04/06/2015 0528   CL 95* 05/13/2015 1639   CL 103 04/06/2015 0528   CO2 24 05/13/2015 1639   CO2 27 04/06/2015 0528   BUN 16 05/13/2015 1639   BUN 13 04/06/2015 0528   CREATININE 1.44* 05/13/2015 1639   CREATININE 1.25* 04/06/2015 0528   GLUCOSE 204* 05/13/2015 1639   GLUCOSE 105* 04/06/2015 0528   CALCIUM 12.5* 05/13/2015 1639   CALCIUM 9.5 04/06/2015 0528   AST 14* 05/13/2015 1639   AST 19 04/05/2015 1155   ALT 9* 05/13/2015 1639   ALT 12* 04/05/2015 1155   ALKPHOS 109 05/13/2015 1639   ALKPHOS 97 04/05/2015 1155   BILITOT 0.2* 05/13/2015 1639   BILITOT 0.6 04/05/2015 1155   PROT 8.6* 05/13/2015 1639   PROT 5.8* 04/05/2015 1155   ALBUMIN 3.4* 05/13/2015 1639   ALBUMIN 2.0* 04/05/2015 1155    Studies/Results: No results found.  Assessment & Plans: Infected pancreatic pseudocyst  VAC dressing - HHN being arranged  Discharge home today with [facility]  Plan dressing change in OR as out-patient on Thursday, 3/30  Discontinue abx's  [name], MD, [facility] Office: [place] 05/17/2015

## 2015-05-17 NOTE — Procedures (Signed)
Patient came to IR for Dr. Vernard Gambles to attempt to flush non-draining 64fr Thal drain.  61fr drain flushes easily with no output after flushing.  Dr. Vernard Gambles contacted Dr. Harlow Asa to inform him of drain status.  Dr. Harlow Asa to come and speak with patient regarding future plans with this drain.

## 2015-05-17 NOTE — Progress Notes (Signed)
Pharmacy Antibiotic Note  Derek Kwak. is a 70 y.o. male admitted on 05/13/2015 with infected pancreatic pseudocyst.  Pharmacy has been consulted for Zosyn dosing. 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 Post op course complicated by abscess and pseudocyst formation. Multiple manipulations of percutaneous drains have not been successful in resolving the infected pseudocyst. Pt was on oral Bactrim, and was transitioned to Zosyn.  Pt is now s/p I&D of wound and application of wound vac 3/26.  Plan: D5 Abx - continue Zosyn 3.375g IV q8h (4 hour infusion time).  Order SCr for AM to see if improving.  Temp (24hrs), Avg:98.1 F (36.7 C), Min:97.6 F (36.4 C), Max:98.7 F (37.1 C)   Recent Labs Lab 05/13/15 1639 05/14/15 0431  WBC 16.1* 17.1*  CREATININE 1.44*  --     Estimated Creatinine Clearance: 52.4 mL/min (by C-G formula based on Cr of 1.44).    Allergies  Allergen Reactions  . Morphine And Related Nausea And Vomiting  . Quinine Other (See Comments)    Platelets dropped  . Voltaren [Diclofenac Sodium] Other (See Comments)    Elevated liver enzymes  . Oxycontin [Oxycodone Hcl] Swelling    Lips swells. Tolerates immediate-release oxycodone as well as hydrocodone.    Antimicrobials this admission: Zosyn 3/23 >>    Dose adjustments this admission: ----  Microbiology results: 3/23 MRSA PCR negative 3/23 Staph PCR negative  Thank you for allowing pharmacy to be a part of this patient's care.  Hershal Coria, PharmD, BCPS Pager: [phone] 05/17/2015 10:58 AM

## 2015-05-17 NOTE — Care Management Note (Signed)
Case Management Note  Patient Details  Name: Derek SEAWOOD Sr. MRN: [id number] Date of Birth: [birth year]  Subjective/Objective:    Admitted s/p Exploratory laparotomy, drainage of infected pancreatic pseudocyst, open packing.              Action/Plan: Discharge planning, spoke with patient at bedside. Patient called wife while I was in the room, spoke with wife on phone. Discussed d/c plan with her, she has been using [facility] RN for wound care prior to admission and would like to use them again. Contacted [facility] for referral. Faxed information to [facility] for insurance approval, awaiting call back.  Expected Discharge Date:                  Expected Discharge Plan:  [place]  In-House Referral:  NA  Discharge planning Services  CM Consult  Post Acute Care Choice:  Durable Medical Equipment, Home Health Choice offered to:  Spouse  DME Arranged:  Vac DME Agency:  [facility]  HH Arranged:  RN [place] Agency:  [facility]  Status of Service:  Completed, signed off  Medicare Important Message Given:    Date Medicare IM Given:    Medicare IM give by:    Date Additional Medicare IM Given:    Additional Medicare Important Message give by:     If discussed at [place] of Stay Meetings, dates discussed:    Additional Comments:  Guadalupe Maple, RN 05/17/2015, 10:46 AM

## 2015-05-17 NOTE — Care Management Important Message (Signed)
Important Message  Patient Details  Name: Derek HASENSTAB Sr. MRN: [id number] Date of Birth: 10/19/1945   Medicare Important Message Given:  Yes    [name] 05/17/2015, 1:39 PMImportant Message  Patient Details  Name: Derek REYMUNDO Sr. MRN: [id number] Date of Birth: 08-13-1945   Medicare Important Message Given:  Yes    [name] 05/17/2015, 1:39 PM

## 2015-05-18 LAB — GLUCOSE, CAPILLARY
Glucose-Capillary: 145 mg/dL — ABNORMAL HIGH (ref 65–99)
Glucose-Capillary: 180 mg/dL — ABNORMAL HIGH (ref 65–99)
Glucose-Capillary: 259 mg/dL — ABNORMAL HIGH (ref 65–99)
Glucose-Capillary: 261 mg/dL — ABNORMAL HIGH (ref 65–99)
Glucose-Capillary: 263 mg/dL — ABNORMAL HIGH (ref 65–99)
Glucose-Capillary: 264 mg/dL — ABNORMAL HIGH (ref 65–99)
Glucose-Capillary: 321 mg/dL — ABNORMAL HIGH (ref 65–99)

## 2015-05-18 NOTE — Progress Notes (Signed)
Patient ID: Derek Mohr Sr., male   DOB: [birth year], 70 y.o.   MRN: [id number]  [facility]  Subjective: Patient asleep.  Did not wake.  Checked drain and wound.  Objective: Vital signs in last 24 hours: Temp:  [98 F (36.7 C)-99.2 F (37.3 C)] 98 F (36.7 C) (03/28 0544) Pulse Rate:  [71-95] 71 (03/28 0544) Resp:  [18] 18 (03/28 0544) BP: (110-140)/(63-86) 133/86 mmHg (03/28 0544) SpO2:  [99 %-100 %] 99 % (03/28 0544) Last BM Date: 05/11/15  Intake/Output from previous day: 03/27 0701 - 03/28 0700 In: 1800 [P.O.:1800] Out: 2200 [Urine:2000; Drains:200] Intake/Output this shift:    Physical Exam: Abdomen - VAC intact and with tannish brown output, moderate  Lab Results:  No results for input(s): WBC, HGB, HCT, PLT in the last 72 hours. BMET No results for input(s): NA, K, CL, CO2, GLUCOSE, BUN, CREATININE, CALCIUM in the last 72 hours. PT/INR No results for input(s): LABPROT, INR in the last 72 hours. Comprehensive Metabolic Panel:    Component Value Date/Time   NA 130* 05/13/2015 1639   NA 138 04/06/2015 0528   K 4.7 05/13/2015 1639   K 3.4* 04/06/2015 0528   CL 95* 05/13/2015 1639   CL 103 04/06/2015 0528   CO2 24 05/13/2015 1639   CO2 27 04/06/2015 0528   BUN 16 05/13/2015 1639   BUN 13 04/06/2015 0528   CREATININE 1.44* 05/13/2015 1639   CREATININE 1.25* 04/06/2015 0528   GLUCOSE 204* 05/13/2015 1639   GLUCOSE 105* 04/06/2015 0528   CALCIUM 12.5* 05/13/2015 1639   CALCIUM 9.5 04/06/2015 0528   AST 14* 05/13/2015 1639   AST 19 04/05/2015 1155   ALT 9* 05/13/2015 1639   ALT 12* 04/05/2015 1155   ALKPHOS 109 05/13/2015 1639   ALKPHOS 97 04/05/2015 1155   BILITOT 0.2* 05/13/2015 1639   BILITOT 0.6 04/05/2015 1155   PROT 8.6* 05/13/2015 1639   PROT 5.8* 04/05/2015 1155   ALBUMIN 3.4* 05/13/2015 1639   ALBUMIN 2.0* 04/05/2015 1155    Studies/Results: No results found.  Assessment & Plans: Infected pancreatic  pseudocyst  VAC in place  Plan dressing change Thursday as out-patient  Awaiting insurance approval of VAC prior to discharge  [name], MD, [facility] Office: [place] 05/18/2015

## 2015-05-18 NOTE — Progress Notes (Signed)
Pt's CBGs are:  8p- [id number]; 32mn-321; 4a-261. Coverage given as ordered.   Pt states he had fast food chicken nuggets last night.

## 2015-05-18 NOTE — Progress Notes (Signed)
Auth for wound vac received today from [facility] (out of network), cost to patient approx $1400 ($40/d x 30 days). Also authed vac through [facility], they are in network with patient's insurance provider, 30 day cost to patient approx $311. Discussed with Dr. Harlow Asa and patient spouse, decision was made to go with [facility] vac. Discussed conversion of [facility] vac to [facility] vac with WOC nurse, Margarita Grizzle, who felt there would be risk in trying to change out vacs prior to surgery. Dr. Harlow Asa agrees, he plans to keep patient in house and proceed with vac change in OR on 05/20/15, using [facility] product. [facility] with arrange with OR to have supplies available. [facility] will also provide [facility] services, wife in agreement with this as well.

## 2015-05-19 LAB — GLUCOSE, CAPILLARY
Glucose-Capillary: 264 mg/dL — ABNORMAL HIGH (ref 65–99)
Glucose-Capillary: 196 mg/dL — ABNORMAL HIGH (ref 65–99)
Glucose-Capillary: 302 mg/dL — ABNORMAL HIGH (ref 65–99)
Glucose-Capillary: 339 mg/dL — ABNORMAL HIGH (ref 65–99)
Glucose-Capillary: 342 mg/dL — ABNORMAL HIGH (ref 65–99)

## 2015-05-19 NOTE — Progress Notes (Signed)
Patient ID: Derek D Jaquez Sr., male   DOB: 08/13/1945, 70 y.o.   MRN: [id number]  General Surgery - [facility]  Subjective: Patient in bed, wife at bedside.  Up to bathroom.  No complaints other than back pain.  Objective: Vital signs in last 24 hours: Temp:  [98.4 F (36.9 C)] 98.4 F (36.9 C) (03/29 0549) Pulse Rate:  [73-80] 80 (03/29 0549) Resp:  [18] 18 (03/29 0549) BP: (121-128)/(50-68) 127/50 mmHg (03/29 0549) SpO2:  [97 %-98 %] 97 % (03/29 0549) Last BM Date: 05/18/15  Intake/Output from previous day: 03/28 0701 - 03/29 0700 In: 1920 [P.O.:1920] Out: 1700 [Urine:1700] Intake/Output this shift:    Physical Exam: HEENT - sclerae clear, mucous membranes moist Abdomen - soft, VAC intact LUQ Ext - no edema, non-tender Neuro - alert & oriented, no focal deficits  Lab Results:  No results for input(s): WBC, HGB, HCT, PLT in the last 72 hours. BMET No results for input(s): NA, K, CL, CO2, GLUCOSE, BUN, CREATININE, CALCIUM in the last 72 hours. PT/INR No results for input(s): LABPROT, INR in the last 72 hours. Comprehensive Metabolic Panel:    Component Value Date/Time   NA 130* 05/13/2015 1639   NA 138 04/06/2015 0528   K 4.7 05/13/2015 1639   K 3.4* 04/06/2015 0528   CL 95* 05/13/2015 1639   CL 103 04/06/2015 0528   CO2 24 05/13/2015 1639   CO2 27 04/06/2015 0528   BUN 16 05/13/2015 1639   BUN 13 04/06/2015 0528   CREATININE 1.44* 05/13/2015 1639   CREATININE 1.25* 04/06/2015 0528   GLUCOSE 204* 05/13/2015 1639   GLUCOSE 105* 04/06/2015 0528   CALCIUM 12.5* 05/13/2015 1639   CALCIUM 9.5 04/06/2015 0528   AST 14* 05/13/2015 1639   AST 19 04/05/2015 1155   ALT 9* 05/13/2015 1639   ALT 12* 04/05/2015 1155   ALKPHOS 109 05/13/2015 1639   ALKPHOS 97 04/05/2015 1155   BILITOT 0.2* 05/13/2015 1639   BILITOT 0.6 04/05/2015 1155   PROT 8.6* 05/13/2015 1639   PROT 5.8* 04/05/2015 1155   ALBUMIN 3.4* 05/13/2015 1639   ALBUMIN 2.0*  04/05/2015 1155    Studies/Results: No results found.  Assessment & Plans: Infected pancreatic pseudocyst  VAC dressing change planned in OR tomorrow AM  Will switch brand of dressing due to insurance requirements  Anticipate discharge home tomorrow after dressing change  Konstantina Nachreiner M. Evelette Hollern, MD, FACS [facility] Office: [phone]   Ardelle Haliburton M 05/19/2015    

## 2015-05-19 NOTE — Progress Notes (Signed)
Pt told earlier in the evening that he wanted his po dilaudid every 4 hrs, and I should wake him up and give it to him.  About 0400, I woke pt and asked him is he "was in pain".  He replied "no", so I returned med back to pyxis. Will continue to monitor.

## 2015-05-20 ENCOUNTER — Other Ambulatory Visit: Payer: PPO

## 2015-05-20 ENCOUNTER — Encounter (HOSPITAL_COMMUNITY)
Admission: AD | Disposition: A | Discharge status: Home / Self Care | Payer: Self-pay | Source: Ambulatory Visit | Attending: Surgery

## 2015-05-20 ENCOUNTER — Encounter (HOSPITAL_COMMUNITY): Payer: Self-pay

## 2015-05-20 ENCOUNTER — Inpatient Hospital Stay (HOSPITAL_COMMUNITY): Payer: PPO | Admitting: Anesthesiology

## 2015-05-20 DIAGNOSIS — E119 Type 2 diabetes mellitus without complications: Secondary | ICD-10-CM | POA: Diagnosis not present

## 2015-05-20 DIAGNOSIS — Z794 Long term (current) use of insulin: Secondary | ICD-10-CM | POA: Diagnosis not present

## 2015-05-20 DIAGNOSIS — S31609A Unspecified open wound of abdominal wall, unspecified quadrant with penetration into peritoneal cavity, initial encounter: Secondary | ICD-10-CM | POA: Diagnosis not present

## 2015-05-20 DIAGNOSIS — K219 Gastro-esophageal reflux disease without esophagitis: Secondary | ICD-10-CM | POA: Diagnosis not present

## 2015-05-20 DIAGNOSIS — S31109A Unspecified open wound of abdominal wall, unspecified quadrant without penetration into peritoneal cavity, initial encounter: Secondary | ICD-10-CM | POA: Diagnosis not present

## 2015-05-20 DIAGNOSIS — K863 Pseudocyst of pancreas: Secondary | ICD-10-CM | POA: Diagnosis not present

## 2015-05-20 DIAGNOSIS — I10 Essential (primary) hypertension: Secondary | ICD-10-CM | POA: Diagnosis not present

## 2015-05-20 HISTORY — PX: APPLICATION OF WOUND VAC: SHX5189

## 2015-05-20 LAB — GLUCOSE, CAPILLARY
Glucose-Capillary: 215 mg/dL — ABNORMAL HIGH (ref 65–99)
Glucose-Capillary: 213 mg/dL — ABNORMAL HIGH (ref 65–99)
Glucose-Capillary: 195 mg/dL — ABNORMAL HIGH (ref 65–99)
Glucose-Capillary: 172 mg/dL — ABNORMAL HIGH (ref 65–99)

## 2015-05-20 SURGERY — APPLICATION, WOUND VAC
Anesthesia: General

## 2015-05-20 MED ORDER — SODIUM CHLORIDE 0.9 % IJ SOLN
INTRAMUSCULAR | Status: AC
  Filled 2015-05-20: qty 10

## 2015-05-20 MED ORDER — MEPERIDINE HCL 50 MG/ML IJ SOLN: 6.2500 mg | INTRAMUSCULAR | Status: DC | PRN

## 2015-05-20 MED ORDER — FENTANYL CITRATE (PF) 100 MCG/2ML IJ SOLN
INTRAMUSCULAR | Status: DC | PRN
  Administered 2015-05-20: 50 ug via INTRAVENOUS

## 2015-05-20 MED ORDER — HYDROMORPHONE HCL 1 MG/ML IJ SOLN
INTRAMUSCULAR | Status: AC
  Filled 2015-05-20: qty 1

## 2015-05-20 MED ORDER — ONDANSETRON HCL 4 MG/2ML IJ SOLN
INTRAMUSCULAR | Status: AC
  Filled 2015-05-20: qty 2

## 2015-05-20 MED ORDER — LACTATED RINGERS IV SOLN
INTRAVENOUS | Status: DC | PRN
  Administered 2015-05-20: 07:00:00 via INTRAVENOUS

## 2015-05-20 MED ORDER — FENTANYL CITRATE (PF) 100 MCG/2ML IJ SOLN
INTRAMUSCULAR | Status: AC
  Filled 2015-05-20: qty 2

## 2015-05-20 MED ORDER — LIDOCAINE HCL (CARDIAC) 20 MG/ML IV SOLN
INTRAVENOUS | Status: DC | PRN
  Administered 2015-05-20: 80 mg via INTRAVENOUS

## 2015-05-20 MED ORDER — LIDOCAINE HCL (CARDIAC) 20 MG/ML IV SOLN
INTRAVENOUS | Status: AC
Start: 2015-05-20 — End: 2015-05-20
  Filled 2015-05-20: qty 5

## 2015-05-20 MED ORDER — METHOCARBAMOL 1000 MG/10ML IJ SOLN
500.0000 mg | Freq: Once | INTRAVENOUS | Status: AC
  Administered 2015-05-20: 500 mg via INTRAVENOUS
  Filled 2015-05-20: qty 5

## 2015-05-20 MED ORDER — FENTANYL CITRATE (PF) 100 MCG/2ML IJ SOLN: 25.0000 ug | INTRAMUSCULAR | Status: DC | PRN

## 2015-05-20 MED ORDER — PROPOFOL 10 MG/ML IV BOLUS
INTRAVENOUS | Status: DC | PRN
  Administered 2015-05-20: 150 mg via INTRAVENOUS

## 2015-05-20 MED ORDER — PROPOFOL 10 MG/ML IV BOLUS
INTRAVENOUS | Status: AC
  Filled 2015-05-20: qty 20

## 2015-05-20 MED ORDER — ONDANSETRON HCL 4 MG/2ML IJ SOLN
INTRAMUSCULAR | Status: DC | PRN
  Administered 2015-05-20: 4 mg via INTRAVENOUS

## 2015-05-20 MED ORDER — KCL IN DEXTROSE-NACL 20-5-0.45 MEQ/L-%-% IV SOLN
INTRAVENOUS | Status: DC
  Administered 2015-05-20: 10:00:00 via INTRAVENOUS
  Filled 2015-05-20: qty 1000

## 2015-05-20 MED ORDER — HYDROMORPHONE HCL 1 MG/ML IJ SOLN
0.2500 mg | INTRAMUSCULAR | Status: DC | PRN
  Administered 2015-05-20 (×4): 0.5 mg via INTRAVENOUS

## 2015-05-20 MED ORDER — MIDAZOLAM HCL 2 MG/2ML IJ SOLN: 0.5000 mg | Freq: Once | INTRAMUSCULAR | Status: DC | PRN

## 2015-05-20 MED ORDER — EPHEDRINE SULFATE 50 MG/ML IJ SOLN
INTRAMUSCULAR | Status: AC
  Filled 2015-05-20: qty 1

## 2015-05-20 SURGICAL SUPPLY — 33 items
APL SKNCLS STERI-STRIP NONHPOA (GAUZE/BANDAGES/DRESSINGS)
BENZOIN TINCTURE PRP APPL 2/3 (GAUZE/BANDAGES/DRESSINGS) IMPLANT
BLADE HEX COATED 2.75 (ELECTRODE) ×2 IMPLANT
BLADE SURG SZ10 CARB STEEL (BLADE) ×2 IMPLANT
COVER SURGICAL LIGHT HANDLE (MISCELLANEOUS) ×2 IMPLANT
DECANTER SPIKE VIAL GLASS SM (MISCELLANEOUS) IMPLANT
DRAIN CHANNEL RND F F (WOUND CARE) IMPLANT
DRAPE LAPAROTOMY T 102X78X121 (DRAPES) IMPLANT
DRAPE LAPAROTOMY TRNSV 102X78 (DRAPE) ×1 IMPLANT
DRAPE SHEET LG 3/4 BI-LAMINATE (DRAPES) IMPLANT
ELECT REM PT RETURN 9FT ADLT (ELECTROSURGICAL) ×2
ELECTRODE REM PT RTRN 9FT ADLT (ELECTROSURGICAL) ×1 IMPLANT
EVACUATOR SILICONE 100CC (DRAIN) IMPLANT
GAUZE SPONGE 4X4 12PLY STRL (GAUZE/BANDAGES/DRESSINGS) ×2 IMPLANT
GLOVE BIOGEL PI IND STRL 7.0 (GLOVE) ×1 IMPLANT
GLOVE BIOGEL PI INDICATOR 7.0 (GLOVE) ×1
GLOVE SURG ORTHO 8.0 STRL STRW (GLOVE) ×2 IMPLANT
GOWN STRL REUS W/TWL LRG LVL3 (GOWN DISPOSABLE) ×2 IMPLANT
GOWN STRL REUS W/TWL XL LVL3 (GOWN DISPOSABLE) ×4 IMPLANT
KIT BASIN OR (CUSTOM PROCEDURE TRAY) ×2 IMPLANT
MARKER SKIN DUAL TIP RULER LAB (MISCELLANEOUS) IMPLANT
NDL HYPO 25X1 1.5 SAFETY (NEEDLE) ×1 IMPLANT
NEEDLE HYPO 25X1 1.5 SAFETY (NEEDLE) ×2 IMPLANT
NS IRRIG 1000ML POUR BTL (IV SOLUTION) ×2 IMPLANT
PACK GENERAL/GYN (CUSTOM PROCEDURE TRAY) ×2 IMPLANT
SPONGE LAP 18X18 X RAY DECT (DISPOSABLE) IMPLANT
STAPLER VISISTAT 35W (STAPLE) IMPLANT
STRIP CLOSURE SKIN 1/2X4 (GAUZE/BANDAGES/DRESSINGS) IMPLANT
SUT ETHILON 3 0 PS 1 (SUTURE) IMPLANT
SUT MNCRL AB 4-0 PS2 18 (SUTURE) IMPLANT
SUT VIC AB 3-0 SH 18 (SUTURE) IMPLANT
SYR CONTROL 10ML LL (SYRINGE) ×1 IMPLANT
TOWEL OR 17X26 10 PK STRL BLUE (TOWEL DISPOSABLE) ×2 IMPLANT

## 2015-05-20 NOTE — Consult Note (Signed)
   [facility] CM Inpatient Consult   05/20/2015  Derek SHALLENBERGER Sr. [birth year] [id number]    Patient screened for [place] Management services. Patient was active with [place] Management as recent as early March 2017. However, further [facility] Care Management follow up was declined. Please see chart review tab then notes for [facility] RNCM patient outreach encounters for further details. Went to bedside to speak with Derek [name] and Derek [name]. Discussed [facility] Care Management program again and to see if they wanted to resume services post hospitalization. Both patient and wife state patient is "doing a lot better now and is now on the right track". They pleasantly decline [facility] Care Management program services. Wife was agreeable to call [place] Management in the future if needed. She accepted brochure with contact information. Will make inpatient RNCM aware.   Marthenia Rolling, MSN-Ed, RN,BSN [facility] Liaison [phone]

## 2015-05-20 NOTE — Progress Notes (Signed)
Discharge instructions given to patient along with patient spouse.  Questions answered.  [facility] has seen patient with instruction on wound vac.

## 2015-05-20 NOTE — Transfer of Care (Signed)
Immediate Anesthesia Transfer of Care Note  Patient: Derek Mohr Sr.  Procedure(s) Performed: Procedure(s): EXHANGE OF ABDOMINAL  WOUND VAC DRESSING (N/A)  Patient Location: PACU  Anesthesia Type:General  Level of Consciousness: awake, alert  and oriented  Airway & Oxygen Therapy: Patient Spontanous Breathing and Patient connected to face mask oxygen  Post-op Assessment: Report given to RN and Post -op Vital signs reviewed and stable  Post vital signs: Reviewed and stable  Last Vitals:  Filed Vitals:   05/20/15 0359 05/20/15 0600  BP: 127/86 149/83  Pulse: 69 73  Temp: 36.9 C 36.7 C  Resp:  20    Complications: No apparent anesthesia complications

## 2015-05-20 NOTE — Progress Notes (Signed)
O.K. For patient to go to floor- per Dr. Annye Asa

## 2015-05-20 NOTE — Anesthesia Preprocedure Evaluation (Addendum)
Anesthesia Evaluation  Patient identified by MRN, date of birth, ID band Patient awake    Reviewed: Allergy & Precautions, NPO status , Patient's Chart, lab work & pertinent test results  History of Anesthesia Complications Negative for: history of anesthetic complications  Airway Mallampati: II  TM Distance: >3 FB Neck ROM: Full    Dental  (+) Missing, Chipped, Dental Advisory Given   Pulmonary sleep apnea (does not use CPAP) , former smoker,    breath sounds clear to auscultation       Cardiovascular hypertension, Pt. on medications and Pt. on home beta blockers (-) angina Rhythm:Regular Rate:Normal     Neuro/Psych Chronic back pain    GI/Hepatic Neg liver ROS, GERD  Medicated and Controlled,Neuroendocrine tumor of pancreas   Endo/Other  diabetes, Insulin Dependent  Renal/GU      Musculoskeletal  (+) Arthritis ,   Abdominal   Peds  Hematology  (+) Blood dyscrasia (Factor V Leiden), ,   Anesthesia Other Findings   Reproductive/Obstetrics                            Anesthesia Physical Anesthesia Plan  ASA: III  Anesthesia Plan: General   Post-op Pain Management:    Induction: Intravenous  Airway Management Planned: LMA  Additional Equipment:   Intra-op Plan:   Post-operative Plan:   Informed Consent: I have reviewed the patients History and Physical, chart, labs and discussed the procedure including the risks, benefits and alternatives for the proposed anesthesia with the patient or authorized representative who has indicated his/her understanding and acceptance.   Dental advisory given  Plan Discussed with: CRNA and Surgeon  Anesthesia Plan Comments: (Plan routine monitors, GA- LMA OK)        Anesthesia Quick Evaluation

## 2015-05-20 NOTE — Anesthesia Postprocedure Evaluation (Signed)
Anesthesia Post Note  Patient: Derek SCHNELLER Sr.  Procedure(s) Performed: Procedure(s) (LRB): EXHANGE OF ABDOMINAL  WOUND VAC DRESSING (N/A)  Patient location during evaluation: PACU Anesthesia Type: General Level of consciousness: awake and alert, oriented and patient cooperative Pain management: pain level controlled Vital Signs Assessment: post-procedure vital signs reviewed and stable Respiratory status: spontaneous breathing, nonlabored ventilation and respiratory function stable Cardiovascular status: blood pressure returned to baseline and stable Postop Assessment: no signs of nausea or vomiting Anesthetic complications: no    Last Vitals:  Filed Vitals:   05/20/15 0930 05/20/15 0948  BP: 145/72 154/75  Pulse: 67 72  Temp: 36.7 C 36.7 C  Resp: 18 16    Last Pain:  Filed Vitals:   05/20/15 1003  PainSc: 0-No pain                 Luxe Cuadros,E. Jann Ra

## 2015-05-20 NOTE — Op Note (Signed)
NAMEVANCIL, GORZYNSKI NO.:  [id number]  MEDICAL RECORD NO.:  [id number]  LOCATION:                                 FACILITY:  PHYSICIAN:  [name], MD      DATE OF BIRTH:  Dec 18, 1945  DATE OF PROCEDURE:  05/20/2015                              OPERATIVE REPORT   PREOPERATIVE DIAGNOSIS:  Infected pancreatic pseudocyst.  POSTOPERATIVE DIAGNOSIS:  Infected pancreatic pseudocyst.  PROCEDURE:  Vacuum dressing change under anesthesia.  SURGEON:  [name], MD, FACS  ANESTHESIA:  General per Dr. Annye Asa.  ESTIMATED BLOOD LOSS:  Minimal.  PREPARATION:  Betadine.  COMPLICATIONS:  None.  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  The patient has a vacuum dressing in place.  He is due for dressing change.  He now comes to the operating room.  BODY OF REPORT:  Procedure was done in OR #3 at the [facility].  The patient was brought to the operating room, placed in supine position on the operating room table.  Following administration of general anesthesia, the patient was prepped and draped in the usual aseptic fashion.  Previous vacuum dressing was completely removed.  Cavity was irrigated with warm saline which was evacuated. There was minimal drainage.  There was no odor.  There was early granulation tissue throughout the cavity.  A vacuum dressing sponges fashion to the appropriate shape and inserted approximately 12 to 15 cm deep in the wound.  A 2nd sponge was then placed in the subcutaneous tissues.  The dressing was placed on the skin to achieve a seal.  The dressing is in place.  The vacuum suctioned at 125. Good seal was noted without air leak.  The patient tolerated the procedure well.  The patient was awakened from anesthesia and brought to the recovery room.     [name],  MD   ______________________________ [name], MD    TMG/MEDQ  D:  05/20/2015  T:  05/20/2015  Job:  [id number]

## 2015-05-20 NOTE — Anesthesia Procedure Notes (Signed)
Procedure Name: LMA Insertion Date/Time: 05/20/2015 7:54 AM Performed by: Glory Buff Pre-anesthesia Checklist: Patient identified, Emergency Drugs available, Suction available and Patient being monitored Patient Re-evaluated:Patient Re-evaluated prior to inductionOxygen Delivery Method: Circle system utilized Preoxygenation: Pre-oxygenation with 100% oxygen Intubation Type: IV induction LMA: LMA inserted LMA Size: 4.0 Number of attempts: 1 Tube secured with: Tape Dental Injury: Teeth and Oropharynx as per pre-operative assessment

## 2015-05-20 NOTE — H&P (View-Only) (Signed)
Patient ID: Derek Mohr Sr., male   DOB: 10-15-1945, 70 y.o.   MRN: [id number]  [facility]  Subjective: Patient in bed, wife at bedside.  Up to bathroom.  No complaints other than back pain.  Objective: Vital signs in last 24 hours: Temp:  [98.4 F (36.9 C)] 98.4 F (36.9 C) (03/29 0549) Pulse Rate:  [73-80] 80 (03/29 0549) Resp:  [18] 18 (03/29 0549) BP: (121-128)/(50-68) 127/50 mmHg (03/29 0549) SpO2:  [97 %-98 %] 97 % (03/29 0549) Last BM Date: 05/18/15  Intake/Output from previous day: 03/28 0701 - 03/29 0700 In: 1920 [P.O.:1920] Out: 1700 [Urine:1700] Intake/Output this shift:    Physical Exam: HEENT - sclerae clear, mucous membranes moist Abdomen - soft, VAC intact LUQ Ext - no edema, non-tender Neuro - alert & oriented, no focal deficits  Lab Results:  No results for input(s): WBC, HGB, HCT, PLT in the last 72 hours. BMET No results for input(s): NA, K, CL, CO2, GLUCOSE, BUN, CREATININE, CALCIUM in the last 72 hours. PT/INR No results for input(s): LABPROT, INR in the last 72 hours. Comprehensive Metabolic Panel:    Component Value Date/Time   NA 130* 05/13/2015 1639   NA 138 04/06/2015 0528   K 4.7 05/13/2015 1639   K 3.4* 04/06/2015 0528   CL 95* 05/13/2015 1639   CL 103 04/06/2015 0528   CO2 24 05/13/2015 1639   CO2 27 04/06/2015 0528   BUN 16 05/13/2015 1639   BUN 13 04/06/2015 0528   CREATININE 1.44* 05/13/2015 1639   CREATININE 1.25* 04/06/2015 0528   GLUCOSE 204* 05/13/2015 1639   GLUCOSE 105* 04/06/2015 0528   CALCIUM 12.5* 05/13/2015 1639   CALCIUM 9.5 04/06/2015 0528   AST 14* 05/13/2015 1639   AST 19 04/05/2015 1155   ALT 9* 05/13/2015 1639   ALT 12* 04/05/2015 1155   ALKPHOS 109 05/13/2015 1639   ALKPHOS 97 04/05/2015 1155   BILITOT 0.2* 05/13/2015 1639   BILITOT 0.6 04/05/2015 1155   PROT 8.6* 05/13/2015 1639   PROT 5.8* 04/05/2015 1155   ALBUMIN 3.4* 05/13/2015 1639   ALBUMIN 2.0*  04/05/2015 1155    Studies/Results: No results found.  Assessment & Plans: Infected pancreatic pseudocyst  VAC dressing change planned in OR tomorrow AM  Will switch brand of dressing due to insurance requirements  Anticipate discharge home tomorrow after dressing change  [name], MD, [facility] Office: [place] 05/19/2015

## 2015-05-20 NOTE — Interval H&P Note (Signed)
History and Physical Interval Note:  05/20/2015 7:15 AM  Derek Mohr Sr.  has presented today for surgery, with the diagnosis of ABDOMINAL WOUND.  The various methods of treatment have been discussed with the patient and family. After consideration of risks, benefits and other options for treatment, the patient has consented to    Procedure(s): EXHANGE OF ABDOMINAL  WOUND VAC DRESSING (N/A) as a surgical intervention .    The patient's history has been reviewed, patient examined, no change in status, stable for surgery.  I have reviewed the patient's chart and labs.  Questions were answered to the patient's satisfaction.    [name], MD, [facility] Office: [place]

## 2015-05-20 NOTE — Brief Op Note (Signed)
05/13/2015 - 05/20/2015  8:16 AM  PATIENT:  Derek Mohr Sr.  70 y.o. male  PRE-OPERATIVE DIAGNOSIS:  ABDOMINAL WOUND  POST-OPERATIVE DIAGNOSIS:  ABDOMINAL WOUND  PROCEDURE:  Procedure(s): EXHANGE OF ABDOMINAL  WOUND VAC DRESSING (N/A)  SURGEON:  Surgeon(s) and Role:    * Armandina Gemma, MD - Primary  ANESTHESIA:   general  EBL:     BLOOD ADMINISTERED:none  DRAINS: Vacuum dressing with two sponges   LOCAL MEDICATIONS USED:  NONE  SPECIMEN:  No Specimen  DISPOSITION OF SPECIMEN:  N/A  COUNTS:  YES  TOURNIQUET:  * No tourniquets in log *  DICTATION: .Other Dictation: Dictation Number [id number]  PLAN OF CARE: Admit to inpatient   PATIENT DISPOSITION:  PACU - hemodynamically stable.   Delay start of Pharmacological VTE agent (>24hrs) due to surgical blood loss or risk of bleeding: yes  [name], MD, [facility] Office: [phone]

## 2015-05-21 DIAGNOSIS — I10 Essential (primary) hypertension: Secondary | ICD-10-CM | POA: Diagnosis not present

## 2015-05-21 DIAGNOSIS — F419 Anxiety disorder, unspecified: Secondary | ICD-10-CM | POA: Diagnosis not present

## 2015-05-21 DIAGNOSIS — F329 Major depressive disorder, single episode, unspecified: Secondary | ICD-10-CM | POA: Diagnosis not present

## 2015-05-25 NOTE — Discharge Summary (Signed)
Physician Discharge Summary [facility]  Patient ID: Derek Mohr Sr. MRN: [id number] DOB/AGE: 1945-09-26 70 y.o.  Admit date: 05/13/2015 Discharge date: 05/20/2015  Admission Diagnoses:  Infected pancreatic pseudocyst   Discharge Diagnoses:  Principal Problem:   Infected pancreatic pseudocyst   Discharged Condition: good  Hospital Course: patient is admitted due to inability of interventional radiology to successfully drained infected pancreatic pseudocyst with percutaneous technique.  Patient prepared and taken to the operating room where open drainage and debridement was performed.  Postoperatively the patient required return to the operating room for wound care and dressing change.  Patient's discharge was delayed due to inability to acquire approval for vacuum dressing for home use.  Dressing was changed to the brand of vacuum dressing approved by the patient's insurance company and the patient was discharged home.  Consults: None  Treatments: surgery: wound debridement and vacuum dressing changes  Discharge Exam: Blood pressure 123/80, pulse 88, temperature 97.8 F (36.6 C), temperature source Oral, resp. rate 18, height 6' (1.829 m), weight 92.942 kg (204 lb 14.4 oz), SpO2 97 %. See progress note date of discharge  Disposition: Home  Discharge Instructions    Diet - low sodium heart healthy    Complete by:  As directed      Discharge instructions    Complete by:  As directed   [place]:   Carry a list of your medications and allergies with you at all times  Call your pharmacy at least 1 week in advance to refill prescriptions  Do not mix any prescribed pain medicine with alcohol  Do not drive any motor vehicles while taking pain medication  Take medications with food unless otherwise directed  Follow-up appointments (date to return to physician): Please call [phone] to confirm your  follow up appointment with your surgeon.  Call your Surgeon if you have:  Temperature greater than 101.0  Persistent nausea and vomiting  Severe uncontrolled pain  Redness, tenderness, or signs of infection (pain, swelling, redness, odor or    green/yellow discharge around the site)  Difficulty breathing, headache or visual disturbances  Hives  Persistent dizziness or light-headedness  Any other questions or concerns you may have after discharge  In an emergency, call 911 or go to an Emergency Department at a nearby hospital.   Diet: Begin with liquids, and if they are tolerated, resume your usual diet.  Avoid spicy, greasy or heavy foods.  If you have nausea or vomiting, go back to liquids.  If you cannot keep liquids down, call your doctor.  Avoid alcohol consumption while on prescription pain medications. Good nutrition promotes healing. Increase fiber and fluids.   ADDITIONAL INSTRUCTIONS: Wound care as directed.  [name], MD, [facility] Office: [phone]     Discharge wound care:    Complete by:  As directed   My office will contact to coordinate.     Increase activity slowly    Complete by:  As directed             Medication List    TAKE these medications        ALEVE 220 MG tablet  Generic drug:  naproxen sodium  Take 440 mg by mouth 2 (two) times daily with a meal.     aspirin EC 81 MG tablet  Take 81 mg by mouth at bedtime.     carvedilol 25 MG tablet  Commonly known  as:  COREG  Take 0.5 tablets (12.5 mg total) by mouth 2 (two) times daily with a meal.     clobetasol ointment 0.05 %  Commonly known as:  TEMOVATE  Apply 1 application topically daily as needed (for peeling skin on hands and feet). Reported on 03/17/2015     [employer] TRIPLE MAGNESIUM COMPLEX PO  Take 1-2 capsules by mouth 2 (two) times daily. Two capsules in the morning and one at night     diphenhydrAMINE 25 mg capsule  Commonly known as:  BENADRYL  Take 50  mg by mouth 2 (two) times daily. Reported on 03/17/2015     fluconazole 100 MG tablet  Commonly known as:  DIFLUCAN  Take 100 mg by mouth daily.     HYDROmorphone 2 MG tablet  Commonly known as:  DILAUDID  Take 1 tablet (2 mg total) by mouth every 3 (three) hours as needed for moderate pain or severe pain.     HYDROmorphone 2 MG tablet  Commonly known as:  DILAUDID  Take 1 tablet (2 mg total) by mouth every 4 (four) hours as needed for moderate pain or severe pain.     insulin aspart 100 UNIT/ML injection  Commonly known as:  NOVOLOG  Inject 0-9 Units into the skin 3 (three) times daily before meals.     insulin glargine 100 UNIT/ML injection  Commonly known as:  LANTUS  Inject 0.2 mLs (20 Units total) into the skin daily.     Insulin Syringes (Disposable) U-100 0.5 ML Misc  20 Units by Does not apply route daily.     lansoprazole 15 MG capsule  Commonly known as:  PREVACID  Take 30 mg by mouth daily as needed (reflux). Reported on 05/13/2015     losartan 50 MG tablet  Commonly known as:  COZAAR  Take 50 mg by mouth daily.     ONETOUCH DELICA LANCETS [id number] Misc  1 Device by Does not apply route 4 (four) times daily.     psyllium 95 % Pack  Commonly known as:  HYDROCIL/METAMUCIL  Take 1 packet by mouth daily as needed for mild constipation.           Follow-up Information    Follow up with [place].   Why:  nurse for assistance with wound vac   Contact information:   [address] [place]  [address] [phone]       Ramiz Turpin M. Jacquita Mulhearn, MD, [facility] Office: [phone]   Signed: [name] 05/25/2015, 9:33 AM

## 2015-05-26 MED FILL — HYDROmorphone HCL 2 MG TABS: 2 | 4 days supply | Qty: 30 | Fill #0

## 2015-05-27 DIAGNOSIS — K863 Pseudocyst of pancreas: Secondary | ICD-10-CM | POA: Diagnosis not present

## 2015-05-27 DIAGNOSIS — T8189XA Other complications of procedures, not elsewhere classified, initial encounter: Secondary | ICD-10-CM | POA: Diagnosis not present

## 2015-05-28 DIAGNOSIS — Z7982 Long term (current) use of aspirin: Secondary | ICD-10-CM | POA: Diagnosis not present

## 2015-05-28 DIAGNOSIS — Z794 Long term (current) use of insulin: Secondary | ICD-10-CM | POA: Diagnosis not present

## 2015-05-28 DIAGNOSIS — T814XXD Infection following a procedure, subsequent encounter: Secondary | ICD-10-CM | POA: Diagnosis not present

## 2015-05-28 DIAGNOSIS — F419 Anxiety disorder, unspecified: Secondary | ICD-10-CM | POA: Diagnosis not present

## 2015-05-28 DIAGNOSIS — M1990 Unspecified osteoarthritis, unspecified site: Secondary | ICD-10-CM | POA: Diagnosis not present

## 2015-05-28 DIAGNOSIS — I10 Essential (primary) hypertension: Secondary | ICD-10-CM | POA: Diagnosis not present

## 2015-05-28 DIAGNOSIS — H8109 Meniere's disease, unspecified ear: Secondary | ICD-10-CM | POA: Diagnosis not present

## 2015-05-28 DIAGNOSIS — L600 Ingrowing nail: Secondary | ICD-10-CM | POA: Diagnosis not present

## 2015-05-28 DIAGNOSIS — E119 Type 2 diabetes mellitus without complications: Secondary | ICD-10-CM | POA: Diagnosis not present

## 2015-05-28 DIAGNOSIS — F329 Major depressive disorder, single episode, unspecified: Secondary | ICD-10-CM | POA: Diagnosis not present

## 2015-05-28 DIAGNOSIS — K219 Gastro-esophageal reflux disease without esophagitis: Secondary | ICD-10-CM | POA: Diagnosis not present

## 2015-05-30 DIAGNOSIS — I10 Essential (primary) hypertension: Secondary | ICD-10-CM | POA: Diagnosis not present

## 2015-05-30 DIAGNOSIS — F329 Major depressive disorder, single episode, unspecified: Secondary | ICD-10-CM | POA: Diagnosis not present

## 2015-05-30 DIAGNOSIS — F419 Anxiety disorder, unspecified: Secondary | ICD-10-CM | POA: Diagnosis not present

## 2015-06-02 DIAGNOSIS — Z7982 Long term (current) use of aspirin: Secondary | ICD-10-CM | POA: Diagnosis not present

## 2015-06-02 DIAGNOSIS — I10 Essential (primary) hypertension: Secondary | ICD-10-CM | POA: Diagnosis not present

## 2015-06-02 DIAGNOSIS — M1990 Unspecified osteoarthritis, unspecified site: Secondary | ICD-10-CM | POA: Diagnosis not present

## 2015-06-02 DIAGNOSIS — F419 Anxiety disorder, unspecified: Secondary | ICD-10-CM | POA: Diagnosis not present

## 2015-06-02 DIAGNOSIS — H8109 Meniere's disease, unspecified ear: Secondary | ICD-10-CM | POA: Diagnosis not present

## 2015-06-02 DIAGNOSIS — T814XXD Infection following a procedure, subsequent encounter: Secondary | ICD-10-CM | POA: Diagnosis not present

## 2015-06-02 DIAGNOSIS — F329 Major depressive disorder, single episode, unspecified: Secondary | ICD-10-CM | POA: Diagnosis not present

## 2015-06-02 DIAGNOSIS — E119 Type 2 diabetes mellitus without complications: Secondary | ICD-10-CM | POA: Diagnosis not present

## 2015-06-02 DIAGNOSIS — Z794 Long term (current) use of insulin: Secondary | ICD-10-CM | POA: Diagnosis not present

## 2015-06-02 DIAGNOSIS — K219 Gastro-esophageal reflux disease without esophagitis: Secondary | ICD-10-CM | POA: Diagnosis not present

## 2015-06-02 MED FILL — SURE COMFORT 0.5 ML SYRINGE: 30G X 1/2" | 13 days supply | Qty: 50 | Fill #2

## 2015-06-02 MED FILL — LANTUS 100 UNITS/ML VIAL: 100 | 28 days supply | Qty: 10 | Fill #2

## 2015-06-04 DIAGNOSIS — F419 Anxiety disorder, unspecified: Secondary | ICD-10-CM | POA: Diagnosis not present

## 2015-06-04 DIAGNOSIS — K219 Gastro-esophageal reflux disease without esophagitis: Secondary | ICD-10-CM | POA: Diagnosis not present

## 2015-06-04 DIAGNOSIS — E119 Type 2 diabetes mellitus without complications: Secondary | ICD-10-CM | POA: Diagnosis not present

## 2015-06-04 DIAGNOSIS — H8109 Meniere's disease, unspecified ear: Secondary | ICD-10-CM | POA: Diagnosis not present

## 2015-06-04 DIAGNOSIS — Z7982 Long term (current) use of aspirin: Secondary | ICD-10-CM | POA: Diagnosis not present

## 2015-06-04 DIAGNOSIS — M1990 Unspecified osteoarthritis, unspecified site: Secondary | ICD-10-CM | POA: Diagnosis not present

## 2015-06-04 DIAGNOSIS — I10 Essential (primary) hypertension: Secondary | ICD-10-CM | POA: Diagnosis not present

## 2015-06-04 DIAGNOSIS — T814XXD Infection following a procedure, subsequent encounter: Secondary | ICD-10-CM | POA: Diagnosis not present

## 2015-06-04 DIAGNOSIS — F329 Major depressive disorder, single episode, unspecified: Secondary | ICD-10-CM | POA: Diagnosis not present

## 2015-06-04 DIAGNOSIS — Z794 Long term (current) use of insulin: Secondary | ICD-10-CM | POA: Diagnosis not present

## 2015-06-07 DIAGNOSIS — Z7982 Long term (current) use of aspirin: Secondary | ICD-10-CM | POA: Diagnosis not present

## 2015-06-07 DIAGNOSIS — M1990 Unspecified osteoarthritis, unspecified site: Secondary | ICD-10-CM | POA: Diagnosis not present

## 2015-06-07 DIAGNOSIS — F329 Major depressive disorder, single episode, unspecified: Secondary | ICD-10-CM | POA: Diagnosis not present

## 2015-06-07 DIAGNOSIS — H8109 Meniere's disease, unspecified ear: Secondary | ICD-10-CM | POA: Diagnosis not present

## 2015-06-07 DIAGNOSIS — E119 Type 2 diabetes mellitus without complications: Secondary | ICD-10-CM | POA: Diagnosis not present

## 2015-06-07 DIAGNOSIS — K219 Gastro-esophageal reflux disease without esophagitis: Secondary | ICD-10-CM | POA: Diagnosis not present

## 2015-06-07 DIAGNOSIS — F419 Anxiety disorder, unspecified: Secondary | ICD-10-CM | POA: Diagnosis not present

## 2015-06-07 DIAGNOSIS — I10 Essential (primary) hypertension: Secondary | ICD-10-CM | POA: Diagnosis not present

## 2015-06-07 DIAGNOSIS — T814XXD Infection following a procedure, subsequent encounter: Secondary | ICD-10-CM | POA: Diagnosis not present

## 2015-06-07 DIAGNOSIS — Z794 Long term (current) use of insulin: Secondary | ICD-10-CM | POA: Diagnosis not present

## 2015-06-08 MED FILL — HYDROmorphone HCL 2 MG TABS: 2 | 15 days supply | Qty: 30 | Fill #0

## 2015-06-10 ENCOUNTER — Other Ambulatory Visit: Payer: Self-pay | Admitting: *Deleted

## 2015-06-10 NOTE — Patient Outreach (Signed)
[name]) Care Management  06/10/2015  Derek [name] Sr. December 22, 1945 [id number]  Initial Telephone Assessment Attempt  RN attempted outreach however unsuccessful. RN able to leave a HIPAA approved voice message requesting a call back. Will inquire further at that time on possible needs for [facility] services.  Raina Mina, RN Care Management Coordinator [place] Office [phone]

## 2015-06-11 DIAGNOSIS — I10 Essential (primary) hypertension: Secondary | ICD-10-CM | POA: Diagnosis not present

## 2015-06-11 DIAGNOSIS — H8109 Meniere's disease, unspecified ear: Secondary | ICD-10-CM | POA: Diagnosis not present

## 2015-06-11 DIAGNOSIS — T814XXD Infection following a procedure, subsequent encounter: Secondary | ICD-10-CM | POA: Diagnosis not present

## 2015-06-11 DIAGNOSIS — F419 Anxiety disorder, unspecified: Secondary | ICD-10-CM | POA: Diagnosis not present

## 2015-06-11 DIAGNOSIS — M1990 Unspecified osteoarthritis, unspecified site: Secondary | ICD-10-CM | POA: Diagnosis not present

## 2015-06-11 DIAGNOSIS — E119 Type 2 diabetes mellitus without complications: Secondary | ICD-10-CM | POA: Diagnosis not present

## 2015-06-11 DIAGNOSIS — Z7982 Long term (current) use of aspirin: Secondary | ICD-10-CM | POA: Diagnosis not present

## 2015-06-11 DIAGNOSIS — F329 Major depressive disorder, single episode, unspecified: Secondary | ICD-10-CM | POA: Diagnosis not present

## 2015-06-11 DIAGNOSIS — Z794 Long term (current) use of insulin: Secondary | ICD-10-CM | POA: Diagnosis not present

## 2015-06-11 DIAGNOSIS — K219 Gastro-esophageal reflux disease without esophagitis: Secondary | ICD-10-CM | POA: Diagnosis not present

## 2015-06-11 MED FILL — HYDROmorphone HCL 2 MG TABS: 2 | 5 days supply | Qty: 40 | Fill #0

## 2015-06-14 DIAGNOSIS — E119 Type 2 diabetes mellitus without complications: Secondary | ICD-10-CM | POA: Diagnosis not present

## 2015-06-14 DIAGNOSIS — Z794 Long term (current) use of insulin: Secondary | ICD-10-CM | POA: Diagnosis not present

## 2015-06-14 DIAGNOSIS — M1990 Unspecified osteoarthritis, unspecified site: Secondary | ICD-10-CM | POA: Diagnosis not present

## 2015-06-14 DIAGNOSIS — I10 Essential (primary) hypertension: Secondary | ICD-10-CM | POA: Diagnosis not present

## 2015-06-14 DIAGNOSIS — T814XXD Infection following a procedure, subsequent encounter: Secondary | ICD-10-CM | POA: Diagnosis not present

## 2015-06-14 DIAGNOSIS — H8109 Meniere's disease, unspecified ear: Secondary | ICD-10-CM | POA: Diagnosis not present

## 2015-06-14 DIAGNOSIS — F329 Major depressive disorder, single episode, unspecified: Secondary | ICD-10-CM | POA: Diagnosis not present

## 2015-06-14 DIAGNOSIS — Z7982 Long term (current) use of aspirin: Secondary | ICD-10-CM | POA: Diagnosis not present

## 2015-06-14 DIAGNOSIS — K219 Gastro-esophageal reflux disease without esophagitis: Secondary | ICD-10-CM | POA: Diagnosis not present

## 2015-06-14 DIAGNOSIS — F419 Anxiety disorder, unspecified: Secondary | ICD-10-CM | POA: Diagnosis not present

## 2015-06-14 MED FILL — SURE COMFORT 0.5 ML SYRINGE: 30G X 1/2" | 25 days supply | Qty: 100 | Fill #0

## 2015-06-15 ENCOUNTER — Other Ambulatory Visit: Payer: Self-pay | Admitting: *Deleted

## 2015-06-15 NOTE — Patient Outreach (Signed)
[place] [facility]) Care Management  06/15/2015  Derek [name] Sr. 08-23-1945 [id number]  Telephone Assessment  RN spoke with pt's caregiver (wife) who indicated pt was not available and usual wants her to response to all calls.  RN introduced [facility] services and the purpose for today's call. Wife volunteered to update RN on pt's current status. Wife reports pt's diabetes is much improved and under control from 300-400 to 150-180 with the most recent at 134. States pt being seen by [place] for wound care with [facility] system. States pt was on 3 sponges for his wound but recently one sponge removed as pt's wound is improving. Pt has a follow up appointment on 5/3 as provider will reassess wound for possible removal of VAC system if pt continues to heal. RN requested for pt to return call for possible engagement of [facility] services. Caregiver took contact number and will relay information for a return call. RN will continue outreach call accordingly.  Raina Mina, RN Care Management Coordinator [place] Office [phone]

## 2015-06-16 DIAGNOSIS — F329 Major depressive disorder, single episode, unspecified: Secondary | ICD-10-CM | POA: Diagnosis not present

## 2015-06-17 ENCOUNTER — Other Ambulatory Visit: Payer: Self-pay | Admitting: *Deleted

## 2015-06-17 NOTE — Patient Outreach (Signed)
[place] [facility]) Care Management  06/17/2015  Derek [name] Sr. [birth year] [id number]   RN received a call from the pt today concerning a call RN placed earlier this week and RN spoke with the pt's spouse Ivin Booty). RN reintroduced the [facility] services and purpose of today's call. Pt very receptive and expressed how he is managing his diabetes. Pt reports several readings over the past week from 233-138 fasting morning blood sugars. Pt also reports update on his ongoing wound care with [facility] services with [place]. Pt reports he is down from 3 to 1 sponge to his VAC system with a follow up appointments next week for possible removal of the [facility] system.  RN briefly discussed diabetes and the importance of prevention measures related to increased knowledge on how to monitoring his diabetes closely and good eating habits related to education. RN also encouraged pt to comply with the pending follow up appointments with his provider concerning his wound care. Pt very grateful as RN offered to further educate pt on his diabetes with community home visit. Pt again receptive and requested RN to contact his wife to arrange the appointment for a home visit. RN discussed a plan of care and goals that would be further discussed on the upcoming home visit (pt again receptive). Will schedule a home visit and continue with [facility] services.  Raina Mina, RN Care Management Coordinator [place] Office [phone]

## 2015-06-17 NOTE — Patient Outreach (Signed)
[place] [facility]) Care Management  06/17/2015  DOMINQUE KRAVCHUK Sr. 1945-10-20 [id number]   RN attempted outreach call to pt's spouse to arrange a home visit however unsuccessful. Will continue outreach call accordingly.  Raina Mina, RN Care Management Coordinator [place] Office [phone]

## 2015-06-18 DIAGNOSIS — F419 Anxiety disorder, unspecified: Secondary | ICD-10-CM | POA: Diagnosis not present

## 2015-06-18 DIAGNOSIS — Z7982 Long term (current) use of aspirin: Secondary | ICD-10-CM | POA: Diagnosis not present

## 2015-06-18 DIAGNOSIS — I10 Essential (primary) hypertension: Secondary | ICD-10-CM | POA: Diagnosis not present

## 2015-06-18 DIAGNOSIS — M1990 Unspecified osteoarthritis, unspecified site: Secondary | ICD-10-CM | POA: Diagnosis not present

## 2015-06-18 DIAGNOSIS — E119 Type 2 diabetes mellitus without complications: Secondary | ICD-10-CM | POA: Diagnosis not present

## 2015-06-18 DIAGNOSIS — K219 Gastro-esophageal reflux disease without esophagitis: Secondary | ICD-10-CM | POA: Diagnosis not present

## 2015-06-18 DIAGNOSIS — Z794 Long term (current) use of insulin: Secondary | ICD-10-CM | POA: Diagnosis not present

## 2015-06-18 DIAGNOSIS — T814XXD Infection following a procedure, subsequent encounter: Secondary | ICD-10-CM | POA: Diagnosis not present

## 2015-06-18 DIAGNOSIS — H8109 Meniere's disease, unspecified ear: Secondary | ICD-10-CM | POA: Diagnosis not present

## 2015-06-18 DIAGNOSIS — F329 Major depressive disorder, single episode, unspecified: Secondary | ICD-10-CM | POA: Diagnosis not present

## 2015-06-21 DIAGNOSIS — I10 Essential (primary) hypertension: Secondary | ICD-10-CM | POA: Diagnosis not present

## 2015-06-21 DIAGNOSIS — F329 Major depressive disorder, single episode, unspecified: Secondary | ICD-10-CM | POA: Diagnosis not present

## 2015-06-21 DIAGNOSIS — F419 Anxiety disorder, unspecified: Secondary | ICD-10-CM | POA: Diagnosis not present

## 2015-06-22 MED FILL — NovoLOG 100 UNIT/ML SOLN: 100 | 28 days supply | Qty: 10 | Fill #2

## 2015-06-24 ENCOUNTER — Other Ambulatory Visit: Payer: Self-pay | Admitting: *Deleted

## 2015-06-24 NOTE — Patient Outreach (Signed)
[place] [facility]) Care Management  06/24/2015  Derek JARACZ Sr. 04-24-1945 [id number]   RN attempted to reach pt at the home number however no answer and unable to leave a message. RN also attempted to reach the pt's caregiver wife as requested on earlier call to make arrangements for a home visit however RN only able to leave a HIPAA approved voice message requesting a call back. Will await a call back.   Raina Mina, RN Care Management Coordinator [place] Office [phone]

## 2015-06-28 MED FILL — HYDROmorphone HCL 2 MG TABS: 2 | 15 days supply | Qty: 30 | Fill #0

## 2015-06-30 DIAGNOSIS — I10 Essential (primary) hypertension: Secondary | ICD-10-CM | POA: Diagnosis not present

## 2015-06-30 DIAGNOSIS — F329 Major depressive disorder, single episode, unspecified: Secondary | ICD-10-CM | POA: Diagnosis not present

## 2015-06-30 DIAGNOSIS — H8109 Meniere's disease, unspecified ear: Secondary | ICD-10-CM | POA: Diagnosis not present

## 2015-06-30 DIAGNOSIS — Z7982 Long term (current) use of aspirin: Secondary | ICD-10-CM | POA: Diagnosis not present

## 2015-06-30 DIAGNOSIS — T814XXD Infection following a procedure, subsequent encounter: Secondary | ICD-10-CM | POA: Diagnosis not present

## 2015-06-30 DIAGNOSIS — M1990 Unspecified osteoarthritis, unspecified site: Secondary | ICD-10-CM | POA: Diagnosis not present

## 2015-06-30 DIAGNOSIS — E119 Type 2 diabetes mellitus without complications: Secondary | ICD-10-CM | POA: Diagnosis not present

## 2015-06-30 DIAGNOSIS — Z794 Long term (current) use of insulin: Secondary | ICD-10-CM | POA: Diagnosis not present

## 2015-06-30 DIAGNOSIS — F419 Anxiety disorder, unspecified: Secondary | ICD-10-CM | POA: Diagnosis not present

## 2015-06-30 DIAGNOSIS — K219 Gastro-esophageal reflux disease without esophagitis: Secondary | ICD-10-CM | POA: Diagnosis not present

## 2015-07-02 MED FILL — LANTUS 100 UNITS/ML VIAL: 100 | 28 days supply | Qty: 10 | Fill #3

## 2015-07-06 ENCOUNTER — Other Ambulatory Visit: Payer: Self-pay | Admitting: *Deleted

## 2015-07-06 NOTE — Patient Outreach (Signed)
[place] [facility]) Care Management  07/06/2015  Derek FUDALA Sr. Mar 02, 1945 [id number]  RN spoke with pt's spouse as requested concerning [facility] services. Wife provided an update on pt indicating his VAC system has been removed and now [facility] is providing wet-to-dry dressing changes to the wound site. Reports pending consultation with a endocrinologist later in May until that time Dr. Harrington Challenger is managing and adjusting pt's diabetic medication accordingly with reported reads around 200. Goals are for readings to be under 200 as spouse reports his readings are a lot better. Wife reports pt continue to take his prescribed medications as recommended and RN was able to review all medications and document any changes. Updated the initial assessment with spouse providing all information today as pt requested all inquires come for his spouse including arranging a home visit. RN offered once to schedule a home visit however wife has requested a follow up call in June and wishes to wait until after pt consults with his new endocrinologist later in this month. RN offered to schedule the next telephone call as spouse receptive and requested mid June. RN will schedule however will inform spouse that RN is available sooner if needed for case management services with home visits or telephonic inquires until that time. No other inquires or request at this time. Plan of care and goals discussed and will remain the same. Plans to re-evaluate in June with pt's progress.   Raina Mina, RN Care Management Coordinator [place] Office [phone]

## 2015-07-07 DIAGNOSIS — E119 Type 2 diabetes mellitus without complications: Secondary | ICD-10-CM | POA: Diagnosis not present

## 2015-07-07 DIAGNOSIS — T814XXD Infection following a procedure, subsequent encounter: Secondary | ICD-10-CM | POA: Diagnosis not present

## 2015-07-07 DIAGNOSIS — I10 Essential (primary) hypertension: Secondary | ICD-10-CM | POA: Diagnosis not present

## 2015-07-07 DIAGNOSIS — K219 Gastro-esophageal reflux disease without esophagitis: Secondary | ICD-10-CM | POA: Diagnosis not present

## 2015-07-07 DIAGNOSIS — Z7982 Long term (current) use of aspirin: Secondary | ICD-10-CM | POA: Diagnosis not present

## 2015-07-07 DIAGNOSIS — F419 Anxiety disorder, unspecified: Secondary | ICD-10-CM | POA: Diagnosis not present

## 2015-07-07 DIAGNOSIS — F329 Major depressive disorder, single episode, unspecified: Secondary | ICD-10-CM | POA: Diagnosis not present

## 2015-07-07 DIAGNOSIS — H8109 Meniere's disease, unspecified ear: Secondary | ICD-10-CM | POA: Diagnosis not present

## 2015-07-07 DIAGNOSIS — M1990 Unspecified osteoarthritis, unspecified site: Secondary | ICD-10-CM | POA: Diagnosis not present

## 2015-07-07 DIAGNOSIS — Z794 Long term (current) use of insulin: Secondary | ICD-10-CM | POA: Diagnosis not present

## 2015-07-07 MED FILL — SURE COMFORT 0.5 ML SYRINGE: 30G X 1/2" | 25 days supply | Qty: 100 | Fill #1

## 2015-07-08 MED FILL — HYDROmorphone HCL 2 MG TABS: 2 | 3 days supply | Qty: 20 | Fill #0

## 2015-07-09 MED FILL — LOSARTAN POTASSIUM 50 MG TA: 50 | 90 days supply | Qty: 90 | Fill #1

## 2015-07-12 ENCOUNTER — Encounter: Payer: Self-pay | Admitting: *Deleted

## 2015-07-12 DIAGNOSIS — G894 Chronic pain syndrome: Secondary | ICD-10-CM | POA: Diagnosis not present

## 2015-07-12 DIAGNOSIS — M5136 Other intervertebral disc degeneration, lumbar region: Secondary | ICD-10-CM | POA: Diagnosis not present

## 2015-07-12 MED FILL — HYDROmorphone HCL 4 MG TABS: 4 | 30 days supply | Qty: 60 | Fill #0

## 2015-07-14 DIAGNOSIS — T814XXD Infection following a procedure, subsequent encounter: Secondary | ICD-10-CM | POA: Diagnosis not present

## 2015-07-14 DIAGNOSIS — I10 Essential (primary) hypertension: Secondary | ICD-10-CM | POA: Diagnosis not present

## 2015-07-14 DIAGNOSIS — H8109 Meniere's disease, unspecified ear: Secondary | ICD-10-CM | POA: Diagnosis not present

## 2015-07-14 DIAGNOSIS — E119 Type 2 diabetes mellitus without complications: Secondary | ICD-10-CM | POA: Diagnosis not present

## 2015-07-14 DIAGNOSIS — K219 Gastro-esophageal reflux disease without esophagitis: Secondary | ICD-10-CM | POA: Diagnosis not present

## 2015-07-14 DIAGNOSIS — M1990 Unspecified osteoarthritis, unspecified site: Secondary | ICD-10-CM | POA: Diagnosis not present

## 2015-07-14 DIAGNOSIS — F419 Anxiety disorder, unspecified: Secondary | ICD-10-CM | POA: Diagnosis not present

## 2015-07-14 DIAGNOSIS — Z794 Long term (current) use of insulin: Secondary | ICD-10-CM | POA: Diagnosis not present

## 2015-07-14 DIAGNOSIS — F329 Major depressive disorder, single episode, unspecified: Secondary | ICD-10-CM | POA: Diagnosis not present

## 2015-07-14 DIAGNOSIS — Z7982 Long term (current) use of aspirin: Secondary | ICD-10-CM | POA: Diagnosis not present

## 2015-07-16 DIAGNOSIS — R809 Proteinuria, unspecified: Secondary | ICD-10-CM | POA: Diagnosis not present

## 2015-07-16 DIAGNOSIS — I10 Essential (primary) hypertension: Secondary | ICD-10-CM | POA: Diagnosis not present

## 2015-07-16 DIAGNOSIS — E119 Type 2 diabetes mellitus without complications: Secondary | ICD-10-CM | POA: Diagnosis not present

## 2015-07-16 MED FILL — ONETOUCH VERIO TEST STRIP: 33 days supply | Qty: 100 | Fill #0

## 2015-07-16 MED FILL — ONE TOUCH DELICA 33G LANCET: 33 days supply | Qty: 100 | Fill #0

## 2015-07-19 DIAGNOSIS — I10 Essential (primary) hypertension: Secondary | ICD-10-CM | POA: Diagnosis not present

## 2015-07-19 DIAGNOSIS — F419 Anxiety disorder, unspecified: Secondary | ICD-10-CM | POA: Diagnosis not present

## 2015-07-19 DIAGNOSIS — H8109 Meniere's disease, unspecified ear: Secondary | ICD-10-CM | POA: Diagnosis not present

## 2015-07-19 DIAGNOSIS — K219 Gastro-esophageal reflux disease without esophagitis: Secondary | ICD-10-CM | POA: Diagnosis not present

## 2015-07-19 DIAGNOSIS — E119 Type 2 diabetes mellitus without complications: Secondary | ICD-10-CM | POA: Diagnosis not present

## 2015-07-19 DIAGNOSIS — Z794 Long term (current) use of insulin: Secondary | ICD-10-CM | POA: Diagnosis not present

## 2015-07-19 DIAGNOSIS — T814XXD Infection following a procedure, subsequent encounter: Secondary | ICD-10-CM | POA: Diagnosis not present

## 2015-07-19 DIAGNOSIS — F329 Major depressive disorder, single episode, unspecified: Secondary | ICD-10-CM | POA: Diagnosis not present

## 2015-07-19 DIAGNOSIS — M1990 Unspecified osteoarthritis, unspecified site: Secondary | ICD-10-CM | POA: Diagnosis not present

## 2015-07-19 DIAGNOSIS — Z7982 Long term (current) use of aspirin: Secondary | ICD-10-CM | POA: Diagnosis not present

## 2015-07-22 MED FILL — NovoLOG 100 UNIT/ML SOLN: 100 | 28 days supply | Qty: 10 | Fill #3

## 2015-07-29 DIAGNOSIS — M5416 Radiculopathy, lumbar region: Secondary | ICD-10-CM | POA: Diagnosis not present

## 2015-07-29 DIAGNOSIS — G5620 Lesion of ulnar nerve, unspecified upper limb: Secondary | ICD-10-CM | POA: Diagnosis not present

## 2015-08-02 ENCOUNTER — Other Ambulatory Visit: Payer: Self-pay | Admitting: *Deleted

## 2015-08-02 NOTE — Patient Outreach (Signed)
[facility] [facility]) Care Management  08/02/2015  PIERCEN EHRSAM Sr. [birth year] [id number]   RN attempted Sheria Lang call has requested and left a HIPAA approved voice message requesting a call back. Will rescheduled ongoing follow up calls accordingly to policy.   Raina Mina, RN Care Management Coordinator [place] Office [phone]

## 2015-08-03 ENCOUNTER — Encounter: Payer: Self-pay | Admitting: *Deleted

## 2015-08-03 ENCOUNTER — Other Ambulatory Visit: Payer: Self-pay | Admitting: *Deleted

## 2015-08-03 MED FILL — LANTUS 100 UNITS/ML VIAL: 100 | 28 days supply | Qty: 10 | Fill #4

## 2015-08-03 NOTE — Patient Outreach (Signed)
[facility]) Care Management  08/03/2015  Derek MATHEWS Sr. 01/25/1946 [id number]   RN spoke with primary caregiver who indicated pt had his initial office visit with a new endocrinologist and now has been placed on a SSI. Caregiver reports pt's readings are much better ranging from 110-130 as pt is able to ride his motorcycle and delivers his insulin bases upon his SS. RN continued to offer community home visit to reinforce pt's recent education however caregiver feels pt is doing well enough to manage his diabetes more independently. RN verified pt received a sufficient amount of education related to his diabetes and feels comfort in managing his diabetes. Based upon caregiver information today and pt's increased knowledge base in managing his care. RN will discharge pt via telephone assessments and provide contact information if pt is in need of [facility] services in the future.  Will notify Dr. Harrington Challenger and close this case with goals met.   Raina Mina, RN Care Management Coordinator [place] Office [phone]

## 2015-08-04 DIAGNOSIS — M5136 Other intervertebral disc degeneration, lumbar region: Secondary | ICD-10-CM | POA: Diagnosis not present

## 2015-08-05 MED FILL — SURE COMFORT 0.5 ML SYRINGE: 30G X 1/2" | 25 days supply | Qty: 100 | Fill #2

## 2015-08-16 MED FILL — ONETOUCH VERIO TEST STRIP: 33 days supply | Qty: 100 | Fill #1

## 2015-08-16 MED FILL — HYDROmorphone HCL 4 MG TABS: 4 | 30 days supply | Qty: 60 | Fill #0

## 2015-08-20 DIAGNOSIS — H5213 Myopia, bilateral: Secondary | ICD-10-CM | POA: Diagnosis not present

## 2015-08-20 DIAGNOSIS — H2513 Age-related nuclear cataract, bilateral: Secondary | ICD-10-CM | POA: Diagnosis not present

## 2015-08-20 DIAGNOSIS — H35372 Puckering of macula, left eye: Secondary | ICD-10-CM | POA: Diagnosis not present

## 2015-08-23 MED FILL — CARVEDILOL 25 MG TABLET: 25 | 90 days supply | Qty: 180 | Fill #1

## 2015-08-27 ENCOUNTER — Other Ambulatory Visit: Payer: Self-pay | Admitting: Physical Medicine and Rehabilitation

## 2015-08-27 DIAGNOSIS — M545 Low back pain: Secondary | ICD-10-CM

## 2015-09-06 ENCOUNTER — Ambulatory Visit
Admission: RE | Admit: 2015-09-06 | Discharge: 2015-09-06 | Disposition: A | Discharge status: Home / Self Care | Payer: PPO | Source: Ambulatory Visit | Attending: Physical Medicine and Rehabilitation | Admitting: Physical Medicine and Rehabilitation

## 2015-09-06 DIAGNOSIS — M545 Low back pain: Secondary | ICD-10-CM

## 2015-09-06 DIAGNOSIS — M4806 Spinal stenosis, lumbar region: Secondary | ICD-10-CM | POA: Diagnosis not present

## 2015-09-06 DIAGNOSIS — Z01818 Encounter for other preprocedural examination: Secondary | ICD-10-CM | POA: Diagnosis not present

## 2015-09-06 MED FILL — LANTUS 100 UNITS/ML VIAL: 100 | 28 days supply | Qty: 10 | Fill #5

## 2015-09-08 MED FILL — SURE COMFORT 0.5 ML SYRINGE: 30G X 1/2" | 25 days supply | Qty: 100 | Fill #3

## 2015-09-10 DIAGNOSIS — K432 Incisional hernia without obstruction or gangrene: Secondary | ICD-10-CM | POA: Diagnosis not present

## 2015-09-17 DIAGNOSIS — G5622 Lesion of ulnar nerve, left upper limb: Secondary | ICD-10-CM | POA: Diagnosis not present

## 2015-09-17 DIAGNOSIS — G5620 Lesion of ulnar nerve, unspecified upper limb: Secondary | ICD-10-CM | POA: Diagnosis not present

## 2015-09-17 MED FILL — NovoLOG 100 UNIT/ML SOLN: 100 | 28 days supply | Qty: 10 | Fill #4

## 2015-09-20 DIAGNOSIS — G894 Chronic pain syndrome: Secondary | ICD-10-CM | POA: Diagnosis not present

## 2015-09-20 DIAGNOSIS — M5136 Other intervertebral disc degeneration, lumbar region: Secondary | ICD-10-CM | POA: Diagnosis not present

## 2015-10-04 MED FILL — SURE COMFORT 0.5 ML SYRINGE: 30G X 1/2" | 25 days supply | Qty: 100 | Fill #4

## 2015-10-04 MED FILL — LANTUS 100 UNITS/ML VIAL: 100 | 50 days supply | Qty: 20 | Fill #0

## 2015-10-05 MED FILL — ONETOUCH VERIO TEST STRIP: 33 days supply | Qty: 100 | Fill #2

## 2015-10-08 ENCOUNTER — Ambulatory Visit: Payer: Self-pay | Admitting: Surgery

## 2015-10-08 DIAGNOSIS — N644 Mastodynia: Secondary | ICD-10-CM | POA: Diagnosis not present

## 2015-10-08 DIAGNOSIS — K432 Incisional hernia without obstruction or gangrene: Secondary | ICD-10-CM | POA: Diagnosis not present

## 2015-10-12 ENCOUNTER — Other Ambulatory Visit: Payer: Self-pay | Admitting: Surgery

## 2015-10-12 DIAGNOSIS — N63 Unspecified lump in unspecified breast: Secondary | ICD-10-CM

## 2015-10-14 ENCOUNTER — Other Ambulatory Visit: Payer: Self-pay | Admitting: Surgery

## 2015-10-14 DIAGNOSIS — N63 Unspecified lump in unspecified breast: Secondary | ICD-10-CM

## 2015-10-20 DIAGNOSIS — M47816 Spondylosis without myelopathy or radiculopathy, lumbar region: Secondary | ICD-10-CM | POA: Diagnosis not present

## 2015-10-22 ENCOUNTER — Ambulatory Visit
Admission: RE | Admit: 2015-10-22 | Discharge: 2015-10-22 | Disposition: A | Discharge status: Home / Self Care | Payer: PPO | Source: Ambulatory Visit | Attending: Surgery | Admitting: Surgery

## 2015-10-22 DIAGNOSIS — N63 Unspecified lump in unspecified breast: Secondary | ICD-10-CM

## 2015-10-22 DIAGNOSIS — N62 Hypertrophy of breast: Secondary | ICD-10-CM | POA: Diagnosis not present

## 2015-10-28 MED FILL — NovoLOG 100 UNIT/ML SOLN: 100 | 28 days supply | Qty: 10 | Fill #5

## 2015-10-28 MED FILL — HYDROmorphone HCL 4 MG TABS: 4 | 30 days supply | Qty: 60 | Fill #0

## 2015-11-08 MED FILL — SURE COMFORT 0.5 ML SYRINGE: 30G X 1/2" | 25 days supply | Qty: 100 | Fill #5

## 2015-11-10 ENCOUNTER — Encounter (HOSPITAL_COMMUNITY): Payer: Self-pay | Admitting: Surgery

## 2015-11-10 DIAGNOSIS — K432 Incisional hernia without obstruction or gangrene: Secondary | ICD-10-CM | POA: Diagnosis present

## 2015-11-10 NOTE — H&P (Signed)
General Surgery [facility]  Derek [name] DOB: 11-11-1945 Married / Language: English / Race: White Male  History of Present Illness  The patient is a 70 year old male who presents with an incisional hernia.  Patient presents with enlarging left subcostal incisional hernia. This is a complication of his open wound related to his pancreatic pseudocyst following distal pancreatectomy. The hernia has been gradually enlarging. He is wearing a binder with some symptomatic control. He would like to discuss surgical repair at some point in the coming year. Patient denies any significant discomfort. He is eating well. He has had no signs or symptoms of intestinal obstruction. His main complaint is chronic back pain.   Allergies QuiNINE Sulfate *ANTIMALARIALS* Voltaren-XR *ANALGESICS - ANTI-INFLAMMATORY* Morphine Sulfate *ANALGESICS - OPIOID* OxyCONTIN *ANALGESICS - OPIOID*  Medication History Bactrim DS (800-'160MG'$  Tablet, 1 (one) Tablet Oral two times daily, Taken starting 05/05/2015) Active. Dilaudid ('4MG'$  Tablet, 1 to 2 Oral daily, as needed, Taken starting 06/28/2015) Active. Lansoprazole ('30MG'$  Capsule DR, Oral) Active. Glimepiride ('4MG'$  Tablet, Oral) Active. Invokana ('100MG'$  Tablet, Oral) Active. MetFORMIN HCl ER ('500MG'$  Tablet ER 24HR, Oral) Active. Fluconazole ('100MG'$  Tablet, Oral) Active. Hydrocodone-Acetaminophen (7.5-'325MG'$  Tablet, Oral) Active. Amoxicillin-Pot Clavulanate (875-'125MG'$  Tablet, Oral) Active. Metoclopramide HCl ('10MG'$  Tablet, Oral) Active. Mupirocin (2% Ointment, External) Active. Sure Comfort Insulin Syringe (30G X 1/2"0.5 ML Misc,) Active. Aspirin ('81MG'$  Tablet Chewable, Oral) Active. Carvedilol ('25MG'$  Tablet, Oral) Active. Clobetasol Propionate (0.05% Ointment, External) Active. Fluticasone Propionate (50MCG/ACT Suspension, Nasal) Active. HYDROmorphone HCl ('2MG'$  Tablet, Oral) Active. NovoLOG (100UNIT/ML Solution,  Subcutaneous) Active. Lantus (100UNIT/ML Solution, Subcutaneous) Active. Losartan Potassium ('50MG'$  Tablet, Oral) Active. Naproxen Sodium ('220MG'$  Capsule, Oral) Active. Medications Reconciled  Vitals Weight: 244 lb Height: 73in Body Surface Area: 2.34 m Body Mass Index: 32.19 kg/m  Temp.: 98.69F(Temporal)  Pulse: 81 (Regular)  BP: 126/76 (Sitting, Left Arm, Standard)  Physical Exam The physical exam findings are as follows: Note:General - appears comfortable, no distress; not diaphorectic  HEENT - normocephalic; sclerae clear, gaze conjugate; mucous membranes moist, dentition good; voice normal  Neck - symmetric on extension; no palpable anterior or posterior cervical adenopathy; no palpable masses in the thyroid bed  Abd - soft without distension; well-healed bilateral subcostal surgical incision; patient is examined both standing and recumbent; there is a fascial defect in the left lateral portion of the incision measuring approximately 8 cm in length by 4 cm in width and easily reducible  Ext - non-tender without significant edema or lymphedema  Neuro - grossly intact; no tremor    Assessment & Plan   INCISIONAL HERNIA, WITHOUT OBSTRUCTION OR GANGRENE (K43.2)  Patient has not unexpectedly developed an incisional hernia on the left side where he had an open wound following debridement of pancreatic pseudocyst. This is becoming progressively larger and mildly symptomatic.  We discussed techniques for operative repair including laparoscopic versus open. I favor an open repair given the fact that he likely has significant intra-abdominal adhesions and also due to the proximity to the costal margin. We discussed the procedure in the hospitalization to be anticipated. We discussed restrictions on his activities following the procedure.   The risks and benefits of the procedure have been discussed at length with the patient.  The patient understands the proposed  procedure, potential alternative treatments, and the course of recovery to be expected.  All of the patient's questions have been answered at this time.  The patient wishes to proceed with surgery.  [name], MD, [facility] Office: [phone]

## 2015-11-11 ENCOUNTER — Encounter (HOSPITAL_COMMUNITY)
Admission: RE | Admit: 2015-11-11 | Discharge: 2015-11-11 | Disposition: A | Discharge status: Home / Self Care | Payer: PPO | Source: Ambulatory Visit | Attending: Surgery | Admitting: Surgery

## 2015-11-11 ENCOUNTER — Encounter (HOSPITAL_COMMUNITY): Payer: Self-pay

## 2015-11-11 LAB — BASIC METABOLIC PANEL
Anion gap: 7 (ref 5–15)
BUN: 21 mg/dL — ABNORMAL HIGH (ref 6–20)
CO2: 23 mmol/L (ref 22–32)
Calcium: 10.7 mg/dL — ABNORMAL HIGH (ref 8.9–10.3)
Chloride: 106 mmol/L (ref 101–111)
Creatinine, Ser: 0.97 mg/dL (ref 0.61–1.24)
GFR calc Af Amer: >60 mL/min (ref >60)
GFR calc non Af Amer: >60 mL/min (ref >60)
Glucose, Bld: 133 mg/dL — ABNORMAL HIGH (ref 65–99)
Potassium: 5.1 mmol/L (ref 3.5–5.1)
Sodium: 136 mmol/L (ref 135–145)

## 2015-11-11 LAB — CBC
HCT: 37.4 % — ABNORMAL LOW (ref 39.0–52.0)
Hemoglobin: 11.8 g/dL — ABNORMAL LOW (ref 13.0–17.0)
MCH: 26.2 pg (ref 26.0–34.0)
MCHC: 31.6 g/dL (ref 30.0–36.0)
MCV: 83.1 fL (ref 78.0–100.0)
Platelets: 404 10*3/uL — ABNORMAL HIGH (ref 150–400)
RBC: 4.5 MIL/uL (ref 4.22–5.81)
RDW: 17 % — ABNORMAL HIGH (ref 11.5–15.5)
WBC: 7.8 10*3/uL (ref 4.0–10.5)

## 2015-11-11 LAB — GLUCOSE, CAPILLARY: Glucose-Capillary: 136 mg/dL — ABNORMAL HIGH (ref 65–99)

## 2015-11-11 MED ORDER — SODIUM CHLORIDE 0.9 % IV SOLN
1.0000 g | INTRAVENOUS | Status: AC
  Administered 2015-11-12: 1 g via INTRAVENOUS
  Filled 2015-11-11: qty 1

## 2015-11-11 NOTE — Pre-Procedure Instructions (Addendum)
Derek Mohr Sr.  11/11/2015     Your procedure is scheduled on Friday, September 22.  Report to [facility] Admitting at 9:15 AM                Your surgery or procedure is scheduled for 11:15 AM   Call this number if you have problems the morning of surgery:[phone]                For any other questions, please call [phone], Monday - Friday 8 AM - 4 PM.   Remember:  Do not eat food or drink liquids after midnight.  Take these medicines the morning of surgery with A SIP OF WATER: carvedilol (COREG), Omeprazole, Benadryl.           Take if needed Dilaudid              1 Week prior to surgery STOP taking Aspirin , Aspirin Products (Goody Powder, Excedrin Migraine), Ibuprofen (Advil), Naproxen (Aleve), Vitimins and Herbal Products (ie Fish Oil)                  How to Manage Your Diabetes Before and After Surgery  Why is it important to control my blood sugar before and after surgery? . Improving blood sugar levels before and after surgery helps healing and can limit problems. . A way of improving blood sugar control is eating a healthy diet by: o  Eating less sugar and carbohydrates o  Increasing activity/exercise o  Talking with your doctor about reaching your blood sugar goals . High blood sugars (greater than 180 mg/dL) can raise your risk of infections and slow your recovery, so you will need to focus on controlling your diabetes during the weeks before surgery. . Make sure that the doctor who takes care of your diabetes knows about your planned surgery including the date and location.  How do I manage my blood sugar before surgery? . Check your blood sugar at least 4 times a day, starting 2 days before surgery, to make sure that the level is not too high or low. o Check your blood sugar the morning of your surgery when you wake up and every 2 hours until you get to the Short Stay unit. . If your blood sugar is less than 70 mg/dL, you will need to treat  for low blood sugar: o Do not take insulin. o Treat a low blood sugar (less than 70 mg/dL) with  cup of clear juice (cranberry or apple), 4 glucose tablets, OR glucose gel. o Recheck blood sugar in 15 minutes after treatment (to make sure it is greater than 70 mg/dL). If your blood sugar is not greater than 70 mg/dL on recheck, call [phone] for further instructions. . Report your blood sugar to the short stay nurse when you get to Short Stay.  . If you are admitted to the hospital after surgery: o Your blood sugar will be checked by the staff and you will probably be given insulin after surgery (instead of oral diabetes medicines) to make sure you have good blood sugar levels. o The goal for blood sugar control after surgery is 80-180 mg/dL.  WHAT DO I DO ABOUT MY DIABETES MEDICATION?   Marland Kitchen Do not take oral diabetes medicines (pills) the morning of surgery.  . THE NIGHT BEFORE SURGERY, take 20 units of Lantus insulin.      . If your CBG is greater than 220 mg/dL, you may  take  of your sliding scale (correction) dose of insulin. Patient Signature:  Date:   Nurse Signature:  Date:     Do not wear jewelry, make-up or nail polish.  Do not wear lotions, powders, or perfumes, or deoderant.  Do not shave 48 hours prior to surgery.  Men may shave face and neck.  Do not bring valuables to the hospital.  [facility] is not responsible for any belongings or valuables.  Contacts, dentures or bridgework may not be worn into surgery.  Leave your suitcase in the car.  After surgery it may be brought to your room.  For patients admitted to the hospital, discharge time will be determined by your treatment team.  Patients discharged the day of surgery will not be allowed to drive home.   Name and phone number of your driver:  -  Special instructions: Review  [facility] - Preparing For Surgery.  Please read over the following fact sheets that you were given. [name]- Preparing For  Surgery, Coughing and Deep Breathing, Incentive Spirometry, Pain Booklet

## 2015-11-12 ENCOUNTER — Encounter (HOSPITAL_COMMUNITY): Payer: Self-pay | Admitting: Urology

## 2015-11-12 ENCOUNTER — Encounter (HOSPITAL_COMMUNITY)
Admission: RE | Disposition: A | Discharge status: Home / Self Care | Payer: Self-pay | Source: Ambulatory Visit | Attending: Surgery

## 2015-11-12 ENCOUNTER — Inpatient Hospital Stay (HOSPITAL_COMMUNITY): Payer: PPO | Admitting: Anesthesiology

## 2015-11-12 ENCOUNTER — Inpatient Hospital Stay (HOSPITAL_COMMUNITY)
Admission: RE | Admit: 2015-11-12 | Discharge: 2015-11-15 | DRG: 355 | Disposition: A | Discharge status: Home / Self Care | Payer: PPO | Source: Ambulatory Visit | Attending: Surgery | Admitting: Surgery

## 2015-11-12 DIAGNOSIS — E669 Obesity, unspecified: Secondary | ICD-10-CM | POA: Diagnosis present

## 2015-11-12 DIAGNOSIS — Z79899 Other long term (current) drug therapy: Secondary | ICD-10-CM

## 2015-11-12 DIAGNOSIS — M1990 Unspecified osteoarthritis, unspecified site: Secondary | ICD-10-CM | POA: Diagnosis not present

## 2015-11-12 DIAGNOSIS — K219 Gastro-esophageal reflux disease without esophagitis: Secondary | ICD-10-CM | POA: Diagnosis not present

## 2015-11-12 DIAGNOSIS — Z794 Long term (current) use of insulin: Secondary | ICD-10-CM

## 2015-11-12 DIAGNOSIS — I10 Essential (primary) hypertension: Secondary | ICD-10-CM | POA: Diagnosis not present

## 2015-11-12 DIAGNOSIS — M549 Dorsalgia, unspecified: Secondary | ICD-10-CM | POA: Diagnosis not present

## 2015-11-12 DIAGNOSIS — Z7982 Long term (current) use of aspirin: Secondary | ICD-10-CM

## 2015-11-12 DIAGNOSIS — G8929 Other chronic pain: Secondary | ICD-10-CM | POA: Diagnosis not present

## 2015-11-12 DIAGNOSIS — Z6832 Body mass index (BMI) 32.0-32.9, adult: Secondary | ICD-10-CM | POA: Diagnosis not present

## 2015-11-12 DIAGNOSIS — K432 Incisional hernia without obstruction or gangrene: Secondary | ICD-10-CM | POA: Diagnosis not present

## 2015-11-12 DIAGNOSIS — E1165 Type 2 diabetes mellitus with hyperglycemia: Secondary | ICD-10-CM | POA: Diagnosis present

## 2015-11-12 HISTORY — PX: INSERTION OF MESH: SHX5868

## 2015-11-12 HISTORY — PX: INCISIONAL HERNIA REPAIR: SHX193

## 2015-11-12 LAB — CBC
HCT: 37.1 % — ABNORMAL LOW (ref 39.0–52.0)
Hemoglobin: 11.8 g/dL — ABNORMAL LOW (ref 13.0–17.0)
MCH: 26.5 pg (ref 26.0–34.0)
MCHC: 31.8 g/dL (ref 30.0–36.0)
MCV: 83.4 fL (ref 78.0–100.0)
Platelets: 387 10*3/uL (ref 150–400)
RBC: 4.45 MIL/uL (ref 4.22–5.81)
RDW: 17 % — ABNORMAL HIGH (ref 11.5–15.5)
WBC: 10 10*3/uL (ref 4.0–10.5)

## 2015-11-12 LAB — GLUCOSE, CAPILLARY
Glucose-Capillary: 198 mg/dL — ABNORMAL HIGH (ref 65–99)
Glucose-Capillary: 165 mg/dL — ABNORMAL HIGH (ref 65–99)

## 2015-11-12 LAB — CREATININE, SERUM
Creatinine, Ser: 0.96 mg/dL (ref 0.61–1.24)
GFR calc Af Amer: >60 mL/min (ref >60)
GFR calc non Af Amer: >60 mL/min (ref >60)

## 2015-11-12 LAB — HEMOGLOBIN A1C
Hgb A1c MFr Bld: 8.5 % — ABNORMAL HIGH (ref 4.8–5.6)
Mean Plasma Glucose: 197 mg/dL

## 2015-11-12 SURGERY — REPAIR, HERNIA, INCISIONAL
Anesthesia: General | Site: Abdomen

## 2015-11-12 MED ORDER — ONDANSETRON 4 MG PO TBDP: 4.0000 mg | ORAL_TABLET | Freq: Four times a day (QID) | ORAL | Status: DC | PRN

## 2015-11-12 MED ORDER — PHENYLEPHRINE HCL 10 MG/ML IJ SOLN
INTRAMUSCULAR | Status: DC | PRN
  Administered 2015-11-12: 25 ug/min via INTRAVENOUS

## 2015-11-12 MED ORDER — HYDROMORPHONE HCL 1 MG/ML IJ SOLN
1.0000 mg | INTRAMUSCULAR | Status: DC | PRN
  Administered 2015-11-12: 1 mg via INTRAVENOUS
  Administered 2015-11-12: 2 mg via INTRAVENOUS
  Administered 2015-11-13 (×3): 1 mg via INTRAVENOUS
  Filled 2015-11-12 (×2): qty 1
  Filled 2015-11-12: qty 2
  Filled 2015-11-12 (×2): qty 1

## 2015-11-12 MED ORDER — BUPIVACAINE-EPINEPHRINE 0.25% -1:200000 IJ SOLN
INTRAMUSCULAR | Status: DC | PRN
  Administered 2015-11-12: .0001 mL

## 2015-11-12 MED ORDER — PROPOFOL 10 MG/ML IV BOLUS
INTRAVENOUS | Status: DC | PRN
  Administered 2015-11-12: 120 mg via INTRAVENOUS

## 2015-11-12 MED ORDER — LACTATED RINGERS IV SOLN
INTRAVENOUS | Status: DC
  Administered 2015-11-12: 10:00:00 via INTRAVENOUS

## 2015-11-12 MED ORDER — FENTANYL CITRATE (PF) 100 MCG/2ML IJ SOLN
INTRAMUSCULAR | Status: DC | PRN
  Administered 2015-11-12: 50 ug via INTRAVENOUS
  Administered 2015-11-12: 100 ug via INTRAVENOUS
  Administered 2015-11-12: 50 ug via INTRAVENOUS

## 2015-11-12 MED ORDER — SUGAMMADEX SODIUM 200 MG/2ML IV SOLN
INTRAVENOUS | Status: DC | PRN
  Administered 2015-11-12: 225 mg via INTRAVENOUS

## 2015-11-12 MED ORDER — ACETAMINOPHEN 650 MG RE SUPP
650.0000 mg | Freq: Four times a day (QID) | RECTAL | Status: DC | PRN
Start: 2015-11-12 — End: 2015-11-15

## 2015-11-12 MED ORDER — 0.9 % SODIUM CHLORIDE (POUR BTL) OPTIME
TOPICAL | Status: DC | PRN
  Administered 2015-11-12: 1000 mL

## 2015-11-12 MED ORDER — PHENYLEPHRINE 40 MCG/ML (10ML) SYRINGE FOR IV PUSH (FOR BLOOD PRESSURE SUPPORT)
PREFILLED_SYRINGE | INTRAVENOUS | Status: AC
  Filled 2015-11-12: qty 10

## 2015-11-12 MED ORDER — SUCCINYLCHOLINE CHLORIDE 200 MG/10ML IV SOSY: PREFILLED_SYRINGE | INTRAVENOUS | Status: DC | PRN

## 2015-11-12 MED ORDER — ONDANSETRON HCL 4 MG/2ML IJ SOLN
INTRAMUSCULAR | Status: DC | PRN
  Administered 2015-11-12: 4 mg via INTRAVENOUS

## 2015-11-12 MED ORDER — ONDANSETRON HCL 4 MG/2ML IJ SOLN
4.0000 mg | Freq: Four times a day (QID) | INTRAMUSCULAR | Status: DC | PRN
Start: 2015-11-12 — End: 2015-11-15
  Administered 2015-11-12 – 2015-11-13 (×3): 4 mg via INTRAVENOUS
  Filled 2015-11-12 (×2): qty 2

## 2015-11-12 MED ORDER — SUGAMMADEX SODIUM 500 MG/5ML IV SOLN
INTRAVENOUS | Status: AC
  Filled 2015-11-12: qty 5

## 2015-11-12 MED ORDER — LACTATED RINGERS IV SOLN
INTRAVENOUS | Status: DC | PRN
  Administered 2015-11-12: 12:00:00 via INTRAVENOUS

## 2015-11-12 MED ORDER — BUPIVACAINE-EPINEPHRINE (PF) 0.25% -1:200000 IJ SOLN
INTRAMUSCULAR | Status: AC
  Filled 2015-11-12: qty 30

## 2015-11-12 MED ORDER — ENOXAPARIN SODIUM 40 MG/0.4ML ~~LOC~~ SOLN
40.0000 mg | SUBCUTANEOUS | Status: DC
  Administered 2015-11-13 – 2015-11-14 (×2): 40 mg via SUBCUTANEOUS
  Filled 2015-11-12 (×2): qty 0.4

## 2015-11-12 MED ORDER — LIDOCAINE 2% (20 MG/ML) 5 ML SYRINGE
INTRAMUSCULAR | Status: AC
  Filled 2015-11-12: qty 5

## 2015-11-12 MED ORDER — HYDROMORPHONE HCL 1 MG/ML IJ SOLN
INTRAMUSCULAR | Status: AC
  Filled 2015-11-12: qty 1

## 2015-11-12 MED ORDER — FENTANYL CITRATE (PF) 100 MCG/2ML IJ SOLN
INTRAMUSCULAR | Status: AC
  Filled 2015-11-12: qty 2

## 2015-11-12 MED ORDER — DEXAMETHASONE SODIUM PHOSPHATE 10 MG/ML IJ SOLN
INTRAMUSCULAR | Status: AC
  Filled 2015-11-12: qty 1

## 2015-11-12 MED ORDER — HYDRALAZINE HCL 20 MG/ML IJ SOLN
INTRAMUSCULAR | Status: AC
  Filled 2015-11-12: qty 1

## 2015-11-12 MED ORDER — LABETALOL HCL 5 MG/ML IV SOLN
INTRAVENOUS | Status: AC
  Filled 2015-11-12: qty 4

## 2015-11-12 MED ORDER — ONDANSETRON HCL 4 MG/2ML IJ SOLN
INTRAMUSCULAR | Status: AC
  Filled 2015-11-12: qty 2

## 2015-11-12 MED ORDER — ROCURONIUM BROMIDE 10 MG/ML (PF) SYRINGE
PREFILLED_SYRINGE | INTRAVENOUS | Status: AC
  Filled 2015-11-12: qty 10

## 2015-11-12 MED ORDER — PROMETHAZINE HCL 25 MG/ML IJ SOLN
12.5000 mg | Freq: Once | INTRAMUSCULAR | Status: AC
  Administered 2015-11-12: 12.5 mg via INTRAVENOUS
  Filled 2015-11-12: qty 1

## 2015-11-12 MED ORDER — PROPOFOL 10 MG/ML IV BOLUS
INTRAVENOUS | Status: AC
  Filled 2015-11-12: qty 20

## 2015-11-12 MED ORDER — ROCURONIUM BROMIDE 100 MG/10ML IV SOLN
INTRAVENOUS | Status: DC | PRN
  Administered 2015-11-12: 50 mg via INTRAVENOUS
  Administered 2015-11-12 (×2): 20 mg via INTRAVENOUS

## 2015-11-12 MED ORDER — KCL IN DEXTROSE-NACL 20-5-0.45 MEQ/L-%-% IV SOLN
INTRAVENOUS | Status: DC
  Administered 2015-11-12 – 2015-11-15 (×6): via INTRAVENOUS
  Filled 2015-11-12 (×5): qty 1000

## 2015-11-12 MED ORDER — FENTANYL CITRATE (PF) 250 MCG/5ML IJ SOLN: INTRAMUSCULAR | Status: DC | PRN

## 2015-11-12 MED ORDER — HYDROMORPHONE HCL 1 MG/ML IJ SOLN
0.2500 mg | INTRAMUSCULAR | Status: AC | PRN
  Administered 2015-11-12 (×8): 0.25 mg via INTRAVENOUS

## 2015-11-12 MED ORDER — MIDAZOLAM HCL 5 MG/5ML IJ SOLN
INTRAMUSCULAR | Status: DC | PRN
  Administered 2015-11-12: 2 mg via INTRAVENOUS

## 2015-11-12 MED ORDER — CHLORHEXIDINE GLUCONATE CLOTH 2 % EX PADS: 6.0000 | MEDICATED_PAD | Freq: Once | CUTANEOUS | Status: DC

## 2015-11-12 MED ORDER — SUCCINYLCHOLINE CHLORIDE 200 MG/10ML IV SOSY
PREFILLED_SYRINGE | INTRAVENOUS | Status: AC
  Filled 2015-11-12: qty 10

## 2015-11-12 MED ORDER — SUCCINYLCHOLINE 20MG/ML (10ML) SYRINGE FOR MEDFUSION PUMP - OPTIME
INTRAMUSCULAR | Status: DC | PRN
  Administered 2015-11-12: 120 mg via INTRAVENOUS

## 2015-11-12 MED ORDER — METOCLOPRAMIDE HCL 5 MG/ML IJ SOLN
INTRAMUSCULAR | Status: AC
  Filled 2015-11-12: qty 2

## 2015-11-12 MED ORDER — ONDANSETRON HCL 4 MG/2ML IJ SOLN: 4.0000 mg | Freq: Once | INTRAMUSCULAR | Status: DC | PRN

## 2015-11-12 MED ORDER — LIDOCAINE HCL (CARDIAC) 20 MG/ML IV SOLN
INTRAVENOUS | Status: DC | PRN
  Administered 2015-11-12: 100 mg via INTRAVENOUS

## 2015-11-12 MED ORDER — OXYCODONE HCL 5 MG PO TABS
ORAL_TABLET | ORAL | Status: AC
  Administered 2015-11-12: 10 mg via ORAL
  Filled 2015-11-12: qty 2

## 2015-11-12 MED ORDER — FENTANYL CITRATE (PF) 100 MCG/2ML IJ SOLN
25.0000 ug | INTRAMUSCULAR | Status: DC | PRN
  Administered 2015-11-12 (×3): 50 ug via INTRAVENOUS

## 2015-11-12 MED ORDER — ACETAMINOPHEN 325 MG PO TABS
650.0000 mg | ORAL_TABLET | Freq: Four times a day (QID) | ORAL | Status: DC | PRN
Start: 2015-11-12 — End: 2015-11-15

## 2015-11-12 MED ORDER — MIDAZOLAM HCL 2 MG/2ML IJ SOLN
INTRAMUSCULAR | Status: AC
  Filled 2015-11-12: qty 2

## 2015-11-12 MED ORDER — OXYCODONE HCL 5 MG PO TABS
5.0000 mg | ORAL_TABLET | ORAL | Status: DC | PRN
  Administered 2015-11-12: 10 mg via ORAL
  Filled 2015-11-12: qty 2

## 2015-11-12 MED ORDER — PHENYLEPHRINE HCL 10 MG/ML IJ SOLN
INTRAMUSCULAR | Status: DC | PRN
  Administered 2015-11-12: 80 ug via INTRAVENOUS
  Administered 2015-11-12: 160 ug via INTRAVENOUS
  Administered 2015-11-12 (×2): 80 ug via INTRAVENOUS

## 2015-11-12 SURGICAL SUPPLY — 58 items
BINDER ABDOMINAL 12 ML 46-62 (SOFTGOODS) ×1 IMPLANT
CANISTER SUCTION 2500CC (MISCELLANEOUS) ×2 IMPLANT
CHLORAPREP W/TINT 26ML (MISCELLANEOUS) ×2 IMPLANT
COUNTER NEEDLE 20 DBL MAG RED (NEEDLE) ×1 IMPLANT
COVER SURGICAL LIGHT HANDLE (MISCELLANEOUS) ×2 IMPLANT
DECANTER SPIKE VIAL GLASS SM (MISCELLANEOUS) ×2 IMPLANT
DRAIN CHANNEL 19F RND (DRAIN) ×2 IMPLANT
DRAIN HEMOVAC 1/8 X 5 (WOUND CARE) IMPLANT
DRAPE LAPAROSCOPIC ABDOMINAL (DRAPES) ×2 IMPLANT
DRAPE UTILITY XL STRL (DRAPES) ×4 IMPLANT
ELECT CAUTERY BLADE 6.4 (BLADE) ×2 IMPLANT
ELECT REM PT RETURN 9FT ADLT (ELECTROSURGICAL) ×2
ELECTRODE REM PT RTRN 9FT ADLT (ELECTROSURGICAL) ×1 IMPLANT
EVACUATOR SILICONE 100CC (DRAIN) ×2 IMPLANT
GAUZE SPONGE 4X4 12PLY STRL (GAUZE/BANDAGES/DRESSINGS) ×2 IMPLANT
GLOVE BIO SURGEON STRL SZ7.5 (GLOVE) ×1 IMPLANT
GLOVE BIOGEL PI IND STRL 7.0 (GLOVE) IMPLANT
GLOVE BIOGEL PI IND STRL 8 (GLOVE) IMPLANT
GLOVE BIOGEL PI INDICATOR 7.0 (GLOVE) ×1
GLOVE BIOGEL PI INDICATOR 8 (GLOVE) ×1
GLOVE SURG ORTHO 8.0 STRL STRW (GLOVE) ×4 IMPLANT
GLOVE SURG SS PI 7.0 STRL IVOR (GLOVE) ×1 IMPLANT
GLOVE SURG SS PI 8.0 STRL IVOR (GLOVE) ×1 IMPLANT
GOWN STRL REUS W/ TWL LRG LVL3 (GOWN DISPOSABLE) ×2 IMPLANT
GOWN STRL REUS W/ TWL XL LVL3 (GOWN DISPOSABLE) ×1 IMPLANT
GOWN STRL REUS W/TWL LRG LVL3 (GOWN DISPOSABLE) ×4
GOWN STRL REUS W/TWL XL LVL3 (GOWN DISPOSABLE) ×2
KIT BASIN OR (CUSTOM PROCEDURE TRAY) ×2 IMPLANT
KIT ROOM TURNOVER OR (KITS) ×2 IMPLANT
MESH HERNIA 10X14 SHEET (Mesh General) ×1 IMPLANT
NEEDLE 22X1 1/2 (OR ONLY) (NEEDLE) ×1 IMPLANT
NS IRRIG 1000ML POUR BTL (IV SOLUTION) ×2 IMPLANT
PACK GENERAL/GYN (CUSTOM PROCEDURE TRAY) ×2 IMPLANT
PAD ARMBOARD 7.5X6 YLW CONV (MISCELLANEOUS) ×4 IMPLANT
SPECIMEN JAR SMALL (MISCELLANEOUS) ×2 IMPLANT
SPONGE GAUZE 4X4 12PLY STER LF (GAUZE/BANDAGES/DRESSINGS) ×1 IMPLANT
STAPLER VISISTAT 35W (STAPLE) ×3 IMPLANT
SUT ETHIBOND NAB CT1 #1 30IN (SUTURE) IMPLANT
SUT ETHILON 3 0 FSL (SUTURE) ×5 IMPLANT
SUT NOV 1 T60/GS (SUTURE) IMPLANT
SUT NOVA NAB DX-16 0-1 5-0 T12 (SUTURE) ×6 IMPLANT
SUT NOVA NAB GS-21 0 18 T12 DT (SUTURE) ×7 IMPLANT
SUT PROLENE 0 CT 1 CR/8 (SUTURE) IMPLANT
SUT SILK 2 0 (SUTURE) ×2
SUT SILK 2-0 18XBRD TIE 12 (SUTURE) IMPLANT
SUT SILK 3 0 (SUTURE)
SUT SILK 3 0 SH CR/8 (SUTURE) IMPLANT
SUT SILK 3-0 18XBRD TIE 12 (SUTURE) IMPLANT
SUT VIC AB 2-0 CT1 27 (SUTURE)
SUT VIC AB 2-0 CT1 TAPERPNT 27 (SUTURE) IMPLANT
SUT VIC AB 3-0 SH 18 (SUTURE) ×2 IMPLANT
SWAB COLLECTION DEVICE MRSA (MISCELLANEOUS) ×1 IMPLANT
SWAB CULTURE ESWAB REG 1ML (MISCELLANEOUS) ×1 IMPLANT
SYR CONTROL 10ML LL (SYRINGE) ×1 IMPLANT
TAPE CLOTH SURG 6X10 WHT LF (GAUZE/BANDAGES/DRESSINGS) ×1 IMPLANT
TOWEL OR 17X24 6PK STRL BLUE (TOWEL DISPOSABLE) ×2 IMPLANT
TOWEL OR 17X26 10 PK STRL BLUE (TOWEL DISPOSABLE) ×2 IMPLANT
WATER STERILE IRR 1000ML POUR (IV SOLUTION) ×2 IMPLANT

## 2015-11-12 NOTE — Interval H&P Note (Signed)
History and Physical Interval Note:  11/12/2015 11:20 AM  Derek [name]  has presented today for surgery, with the diagnosis of Incisional hernia  The various methods of treatment have been discussed with the patient and family. After consideration of risks, benefits and other options for treatment, the patient has consented to    Procedure(s): [place] (N/A) INSERTION OF MESH (N/A) as a surgical intervention .    The patient's history has been reviewed, patient examined, no change in status, stable for surgery.  I have reviewed the patient's chart and labs.  Questions were answered to the patient's satisfaction.    [name], MD, [facility] Office: [place]

## 2015-11-12 NOTE — Transfer of Care (Signed)
Immediate Anesthesia Transfer of Care Note  Patient: Derek [name]  Procedure(s) Performed: Procedure(s): REPAIR INCISIONAL HERNIA WITH MESH (N/A) INSERTION OF MESH (N/A)  Patient Location: PACU  Anesthesia Type:General  Level of Consciousness: awake  Airway & Oxygen Therapy: Patient Spontanous Breathing  Post-op Assessment: Report given to RN and Post -op Vital signs reviewed and stable  Post vital signs: Reviewed and stable  Last Vitals:  Vitals:   11/12/15 0921  BP: 138/76  Pulse: 60  Resp: 20  Temp: 37 C    Last Pain:  Vitals:   11/12/15 0950  TempSrc:   PainSc: 6          Complications: No apparent anesthesia complications

## 2015-11-12 NOTE — Anesthesia Procedure Notes (Signed)
Procedure Name: Intubation Date/Time: 11/12/2015 11:43 AM Performed by: Manus Gunning, Matty Vanroekel J Pre-anesthesia Checklist: Patient identified, Emergency Drugs available, Suction available, Patient being monitored and Timeout performed Patient Re-evaluated:Patient Re-evaluated prior to inductionOxygen Delivery Method: Circle system utilized Preoxygenation: Pre-oxygenation with 100% oxygen Intubation Type: IV induction Ventilation: Mask ventilation without difficulty Laryngoscope Size: Mac and 4 Grade View: Grade II Tube type: Oral Tube size: 7.5 mm Number of attempts: 1 Placement Confirmation: ETT inserted through vocal cords under direct vision,  positive ETCO2 and breath sounds checked- equal and bilateral Secured at: 21 cm Tube secured with: Tape Dental Injury: Teeth and Oropharynx as per pre-operative assessment

## 2015-11-12 NOTE — Anesthesia Postprocedure Evaluation (Signed)
Anesthesia Post Note  Patient: Derek [name]  Procedure(s) Performed: Procedure(s) (LRB): REPAIR INCISIONAL HERNIA WITH MESH (N/A) INSERTION OF MESH (N/A)  Patient location during evaluation: PACU Anesthesia Type: General Level of consciousness: awake and alert Pain management: pain level controlled Vital Signs Assessment: post-procedure vital signs reviewed and stable Respiratory status: spontaneous breathing, nonlabored ventilation, respiratory function stable and patient connected to nasal cannula oxygen Cardiovascular status: blood pressure returned to baseline and stable Postop Assessment: no signs of nausea or vomiting Anesthetic complications: no    Last Vitals:  Vitals:   11/12/15 1515 11/12/15 1530  BP: 113/68 132/66  Pulse:    Resp:    Temp:      Last Pain:  Vitals:   11/12/15 1532  TempSrc:   PainSc: 10-Worst pain ever                 Ninetta Adelstein,JAMES TERRILL

## 2015-11-12 NOTE — Anesthesia Preprocedure Evaluation (Addendum)
Anesthesia Evaluation  Patient identified by MRN, date of birth, ID band Patient awake    Reviewed: Allergy & Precautions, NPO status , Patient's Chart, lab work & pertinent test results, reviewed documented beta blocker date and time   History of Anesthesia Complications Negative for: history of anesthetic complications  Airway Mallampati: II  TM Distance: >3 FB Neck ROM: Full    Dental  (+) Partial Lower, Dental Advisory Given   Pulmonary neg shortness of breath, sleep apnea , neg COPD, neg recent URI, former smoker,  Welder's lung   Pulmonary exam normal breath sounds clear to auscultation       Cardiovascular hypertension, Pt. on medications and Pt. 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), Poorly Controlled, Type 2, Insulin Dependent  Renal/GU Renal disease (h/o nephrolithiasis)     Musculoskeletal  (+) Arthritis ,   Abdominal (+) + obese,   Peds  Hematology  (+) Blood dyscrasia (Factor V Leiden, no h/o blood clots), ,   Anesthesia Other Findings Pancreatic cancer, hearing loss right ear  Reproductive/Obstetrics                            Anesthesia Physical Anesthesia Plan  ASA: III  Anesthesia Plan: General   Post-op Pain Management:    Induction: Intravenous  Airway Management Planned: Oral ETT  Additional Equipment:   Intra-op Plan:   Post-operative Plan: Extubation in OR  Informed Consent: I have reviewed the patients History and Physical, chart, labs and discussed the procedure including the risks, benefits and alternatives for the proposed anesthesia with the  patient or authorized representative who has indicated his/her understanding and acceptance.   Dental advisory given  Plan Discussed with: CRNA  Anesthesia Plan Comments: (Risks of general anesthesia discussed including, but not limited to, sore throat, hoarse voice, chipped/damaged teeth, injury to vocal cords, nausea and vomiting, allergic reactions, lung infection, heart attack, stroke, and death. All questions answered. )       Anesthesia Quick Evaluation

## 2015-11-12 NOTE — Brief Op Note (Signed)
11/12/2015  2:33 PM  PATIENT:  Derek [name]  70 y.o. male  PRE-OPERATIVE DIAGNOSIS:  Incisional hernia  POST-OPERATIVE DIAGNOSIS:  Incisional hernia  PROCEDURE:  Procedure(s): REPAIR INCISIONAL HERNIA WITH MESH (N/A) INSERTION OF MESH (N/A)  SURGEON:  Surgeon(s) and Role:    * Armandina Gemma, MD - Primary  ANESTHESIA:   general  EBL:  No intake/output data recorded.  BLOOD ADMINISTERED:none  DRAINS: (Two) Blake drain(s) in the subcutaneous space   LOCAL MEDICATIONS USED:  NONE  SPECIMEN:  No Specimen  DISPOSITION OF SPECIMEN:  N/A  COUNTS:  YES  TOURNIQUET:  * No tourniquets in log *  DICTATION: .Other Dictation: Dictation Number [id number]  PLAN OF CARE: Admit to inpatient   PATIENT DISPOSITION:  PACU - hemodynamically stable.   Delay start of Pharmacological VTE agent (>24hrs) due to surgical blood loss or risk of bleeding: yes  [name], MD, [facility] Office: [phone]

## 2015-11-13 LAB — GLUCOSE, CAPILLARY
Glucose-Capillary: 193 mg/dL — ABNORMAL HIGH (ref 65–99)
Glucose-Capillary: 159 mg/dL — ABNORMAL HIGH (ref 65–99)
Glucose-Capillary: 170 mg/dL — ABNORMAL HIGH (ref 65–99)
Glucose-Capillary: 166 mg/dL — ABNORMAL HIGH (ref 65–99)

## 2015-11-13 MED ORDER — ALUM & MAG HYDROXIDE-SIMETH 200-200-20 MG/5ML PO SUSP
30.0000 mL | Freq: Four times a day (QID) | ORAL | Status: DC | PRN
  Administered 2015-11-13: 30 mL via ORAL
  Filled 2015-11-13: qty 30

## 2015-11-13 MED ORDER — NALOXONE HCL 0.4 MG/ML IJ SOLN: 0.4000 mg | INTRAMUSCULAR | Status: DC | PRN

## 2015-11-13 MED ORDER — INSULIN ASPART 100 UNIT/ML ~~LOC~~ SOLN
0.0000 [IU] | Freq: Three times a day (TID) | SUBCUTANEOUS | Status: DC
  Administered 2015-11-13 – 2015-11-14 (×3): 3 [IU] via SUBCUTANEOUS
  Administered 2015-11-14: 5 [IU] via SUBCUTANEOUS
  Administered 2015-11-14: 3 [IU] via SUBCUTANEOUS
  Administered 2015-11-15: 5 [IU] via SUBCUTANEOUS

## 2015-11-13 MED ORDER — HYDROMORPHONE 1 MG/ML IV SOLN
INTRAVENOUS | Status: DC
  Administered 2015-11-13: 3 mg via INTRAVENOUS
  Administered 2015-11-13: 11:00:00 via INTRAVENOUS
  Administered 2015-11-13: 3.9 mg via INTRAVENOUS
  Filled 2015-11-13: qty 25

## 2015-11-13 MED ORDER — ONDANSETRON HCL 4 MG/2ML IJ SOLN
4.0000 mg | Freq: Four times a day (QID) | INTRAMUSCULAR | Status: DC | PRN
Start: 2015-11-13 — End: 2015-11-13
  Filled 2015-11-13: qty 2

## 2015-11-13 MED ORDER — DIPHENHYDRAMINE HCL 12.5 MG/5ML PO ELIX: 12.5000 mg | ORAL_SOLUTION | Freq: Four times a day (QID) | ORAL | Status: DC | PRN

## 2015-11-13 MED ORDER — PANTOPRAZOLE SODIUM 40 MG PO TBEC
40.0000 mg | DELAYED_RELEASE_TABLET | Freq: Every day | ORAL | Status: DC
  Administered 2015-11-13 – 2015-11-15 (×3): 40 mg via ORAL
  Filled 2015-11-13 (×3): qty 1

## 2015-11-13 MED ORDER — SODIUM CHLORIDE 0.9% FLUSH: 9.0000 mL | INTRAVENOUS | Status: DC | PRN

## 2015-11-13 MED ORDER — HYDROMORPHONE HCL 1 MG/ML IJ SOLN
1.0000 mg | INTRAMUSCULAR | Status: DC | PRN
  Administered 2015-11-14: 2 mg via INTRAVENOUS
  Administered 2015-11-14 (×2): 1 mg via INTRAVENOUS
  Administered 2015-11-14 (×2): 2 mg via INTRAVENOUS
  Administered 2015-11-15: 1 mg via INTRAVENOUS
  Administered 2015-11-15: 2 mg via INTRAVENOUS
  Filled 2015-11-13: qty 2
  Filled 2015-11-13 (×3): qty 1
  Filled 2015-11-13 (×3): qty 2

## 2015-11-13 MED ORDER — DIPHENHYDRAMINE HCL 50 MG/ML IJ SOLN: 12.5000 mg | Freq: Four times a day (QID) | INTRAMUSCULAR | Status: DC | PRN

## 2015-11-13 NOTE — Op Note (Signed)
NAMEJUNG, BUESO NO.:  [id number]  MEDICAL RECORD NO.:  [id number]  LOCATION:  6N12C                        FACILITY:  [facility]  PHYSICIAN:  [name], MD      DATE OF BIRTH:  1945-05-17  DATE OF PROCEDURE:  11/12/2015                              OPERATIVE REPORT   PREOPERATIVE DIAGNOSIS:  Incisional hernia.  POSTOPERATIVE DIAGNOSIS:  Incisional hernia.  PROCEDURE:  Open repair of ventral incisional hernia with polypropylene mesh.  SURGEON:  [name], MD, FACS  ANESTHESIA:  General.  ESTIMATED BLOOD LOSS:  Minimal.  PREPARATION:  ChloraPrep.  COMPLICATIONS:  None.  INDICATIONS:  The patient is a 70 year old male with a ventral incisional hernia involving his bilateral subcostal surgical incision. The patient had undergone distal pancreatectomy in January 2017. Postoperative course was complicated by infected pancreatic pseudocyst formation.  The patient subsequently developed ventral incisional hernia.  He now comes to the operating room for repair.  BODY OF REPORT:  Procedure was done in OR #2 at the [place]  The patient was brought to the operating room and placed in supine position on the operating room table.  Following administration of general anesthesia, the patient was positioned and then prepped and draped in the usual aseptic fashion.  After ascertaining that an adequate level of anesthesia had been achieved, the patient's previous surgical incision was excised with a #10 blade and hemostasis was achieved with electrocautery.  Using careful dissection, dissection was carried into the subcutaneous tissues.  Previous scar was excised in its entirety.  Fascia was identified.  There were 2 fascial defects, one on the right and one on the left with a bridge of intact abdominal wall in the midline.  Hernia sac was opened.  The hernia sac was then debrided from the subcutaneous tissues down to the  fascial edges.  The second hernia sac was also opened and debrided. Subcutaneous flaps were then elevated circumferentially.  The bridge of tissue in the midline was divided and debrided as well.  There was dense omentum stuck to the left upper portion of the hernia defect.  This was mobilized and excised in its entirety.  Intraabdominal viscera appeared grossly normal.  Colon was healthy.  Small bowel appeared normal. Approximately half of the omentum was left in place.  Next, the fascial defect was closed with interrupted #1 Novafil simple sutures.  Relaxing incisions were made cephalad over the rectus musculature.  Wound closed relatively easily across the entire abdominal wall.  Next, a 10 inch x 14 inch sheet of polypropylene mesh was brought on the field and trimmed to the appropriate dimensions such that there was approximately a 4-5 cm overlap of the closed incision and there was overlap of the relaxing incisions superiorly.  Mesh was then secured to the abdominal wall with interrupted 0 Novafil sutures. The 19-French Blake drains were brought down from the left and the right and secured to the skin with 3-0 nylon sutures.  Subcutaneous tissues were closed with interrupted 3-0 Vicryl sutures.  Skin was closed with stainless steel staples.  Drains were placed to bulb suction.  Sterile dressings were applied.  The patient was  awakened from anesthesia and brought to the recovery room.  The patient tolerated the procedure well.   [name], MD, [facility] Office: [phone]    TMG/MEDQ  D:  11/12/2015  T:  11/13/2015  Job:  [id number]

## 2015-11-13 NOTE — Progress Notes (Signed)
1 Day Post-Op  Subjective: Having a lot of incisional pain. Not well controlled with current pain medications. Has not been out of bed yet. Vomited once last night but nausea improved this morning. Just sipping clear liquids.  Objective: Vital signs in last 24 hours: Temp:  [96.8 F (36 C)-98.6 F (37 C)] 98.6 F (37 C) (09/23 0640) Pulse Rate:  [50-83] 81 (09/23 0640) Resp:  [9-21] 18 (09/23 0640) BP: (113-142)/(53-76) 117/60 (09/23 0640) SpO2:  [92 %-100 %] 98 % (09/23 0640) Last BM Date: 11/11/15 (day before surgery)  Intake/Output from previous day: 09/22 0701 - 09/23 0700 In: 1887.8 [I.V.:687.8; IV Piggyback:1200] Out: 910 [Urine:750; Drains:110; Blood:50] Intake/Output this shift: No intake/output data recorded.  General appearance: alert, cooperative and mild distress Resp: clear to auscultation bilaterally GI: Mild appropriate tenderness Incision/Wound: Dressing clean and dry, JP drainage serosanguineous  Lab Results:   Recent Labs  11/11/15 1436 11/12/15 1901  WBC 7.8 10.0  HGB 11.8* 11.8*  HCT 37.4* 37.1*  PLT 404* 387   BMET  Recent Labs  11/11/15 1436 11/12/15 1901  NA 136  --   K 5.1  --   CL 106  --   CO2 23  --   GLUCOSE 133*  --   BUN 21*  --   CREATININE 0.97 0.96  CALCIUM 10.7*  --      Studies/Results: No results found.  Anti-infectives: Anti-infectives    Start     Dose/Rate Route Frequency Ordered Stop   11/12/15 1100  ertapenem (INVANZ) 1 g in sodium chloride 0.9 % 50 mL IVPB     1 g 100 mL/hr over 30 Minutes Intravenous To ShortStay Surgical 11/11/15 1436 11/12/15 1140      Assessment/Plan: s/p Procedure(s): REPAIR INCISIONAL HERNIA WITH MESH INSERTION OF MESH Stable postoperatively. Will change to PCA Dilaudid. Out of bed to chair.   LOS: 1 day    Loa Idler T 9/23/2017Patient ID: Derek [name], male   DOB: 08-Sep-1945, 70 y.o.   MRN: [id number]

## 2015-11-14 LAB — BASIC METABOLIC PANEL
Anion gap: 6 (ref 5–15)
BUN: 13 mg/dL (ref 6–20)
CO2: 26 mmol/L (ref 22–32)
Calcium: 10.1 mg/dL (ref 8.9–10.3)
Chloride: 99 mmol/L — ABNORMAL LOW (ref 101–111)
Creatinine, Ser: 0.88 mg/dL (ref 0.61–1.24)
GFR calc Af Amer: >60 mL/min (ref >60)
GFR calc non Af Amer: >60 mL/min (ref >60)
Glucose, Bld: 193 mg/dL — ABNORMAL HIGH (ref 65–99)
Potassium: 4.5 mmol/L (ref 3.5–5.1)
Sodium: 131 mmol/L — ABNORMAL LOW (ref 135–145)

## 2015-11-14 LAB — GLUCOSE, CAPILLARY
Glucose-Capillary: 216 mg/dL — ABNORMAL HIGH (ref 65–99)
Glucose-Capillary: 183 mg/dL — ABNORMAL HIGH (ref 65–99)
Glucose-Capillary: 196 mg/dL — ABNORMAL HIGH (ref 65–99)
Glucose-Capillary: 204 mg/dL — ABNORMAL HIGH (ref 65–99)

## 2015-11-14 LAB — MRSA PCR SCREENING: MRSA by PCR: NEGATIVE

## 2015-11-14 LAB — HEMOGLOBIN A1C
Hgb A1c MFr Bld: 8.8 % — ABNORMAL HIGH (ref 4.8–5.6)
Mean Plasma Glucose: 206 mg/dL

## 2015-11-14 MED ORDER — INSULIN GLARGINE 100 UNIT/ML ~~LOC~~ SOLN
20.0000 [IU] | Freq: Every day | SUBCUTANEOUS | Status: DC
  Administered 2015-11-14: 20 [IU] via SUBCUTANEOUS
  Filled 2015-11-14 (×2): qty 0.2

## 2015-11-14 MED ORDER — INSULIN GLARGINE 100 UNIT/ML ~~LOC~~ SOLN
20.0000 [IU] | Freq: Every day | SUBCUTANEOUS | Status: DC
  Filled 2015-11-14: qty 0.2

## 2015-11-14 NOTE — Progress Notes (Signed)
2 Days Post-Op  Subjective: Pain is better today. PCA kept him awake and he is back on IV meds. Tolerating clear liquid diet without nausea.  Objective: Vital signs in last 24 hours: Temp:  [98.4 F (36.9 C)-99.2 F (37.3 C)] 98.4 F (36.9 C) (09/23 2105) Pulse Rate:  [76-82] 78 (09/23 2105) Resp:  [10-18] 12 (09/23 2105) BP: (109-129)/(59-74) 122/74 (09/23 2105) SpO2:  [94 %-98 %] 98 % (09/23 2105) Last BM Date: 11/11/15  Intake/Output from previous day: 09/23 0701 - 09/24 0700 In: 872.5 [P.O.:240; I.V.:632.5] Out: 2405 [Urine:2350; Drains:55] Intake/Output this shift: Total I/O In: 120 [P.O.:120] Out: 1055 [Urine:975; Drains:80]  General appearance: alert, cooperative and no distress Resp: No wheezing or increased work of breathing GI: Minimal tenderness and no apparent distention Incision/Wound: Binder in place clean and dry  Lab Results:   Recent Labs  11/11/15 1436 11/12/15 1901  WBC 7.8 10.0  HGB 11.8* 11.8*  HCT 37.4* 37.1*  PLT 404* 387   BMET  Recent Labs  11/11/15 1436 11/12/15 1901 11/14/15 0205  NA 136  --  131*  K 5.1  --  4.5  CL 106  --  99*  CO2 23  --  26  GLUCOSE 133*  --  193*  BUN 21*  --  13  CREATININE 0.97 0.96 0.88  CALCIUM 10.7*  --  10.1   CBG (last 3)   Recent Labs  11/13/15 1644 11/13/15 2132 11/14/15 0738  GLUCAP 170* 166* 216*      Studies/Results: No results found.  Anti-infectives: Anti-infectives    Start     Dose/Rate Route Frequency Ordered Stop   11/12/15 1100  ertapenem (INVANZ) 1 g in sodium chloride 0.9 % 50 mL IVPB     1 g 100 mL/hr over 30 Minutes Intravenous To ShortStay Surgical 11/11/15 1436 11/12/15 1140      Assessment/Plan: s/p Procedure(s): REPAIR INCISIONAL HERNIA WITH MESH INSERTION OF MESH Improved today. Better pain control. Advanced regular diet. Hyperglycemia with history of pancreatitis. On SSI . Add small dose of Lantus.   LOS: 2 days    Neilani Duffee  T 9/24/2017Patient ID: Derek [name], male   DOB: Jul 18, 1945, 70 y.o.   MRN: [id number]

## 2015-11-15 ENCOUNTER — Encounter (HOSPITAL_COMMUNITY): Payer: Self-pay | Admitting: Surgery

## 2015-11-15 LAB — GLUCOSE, CAPILLARY: Glucose-Capillary: 232 mg/dL — ABNORMAL HIGH (ref 65–99)

## 2015-11-15 NOTE — Progress Notes (Signed)
Pt discharged home in stable condition. Wife provided transportation. Pt asked for pain medicine before discharge, offered oxy ir as ordered but he declined stating that he will take his dilaudid when he gets home

## 2015-11-15 NOTE — Discharge Summary (Signed)
Physician Discharge Summary [facility]  Patient ID: Derek [name] MRN: [id number] DOB/AGE: 70/19/1947 70 y.o.  Admit date: 11/12/2015 Discharge date: 11/15/2015  Admission Diagnoses:  Incisional hernia  Discharge Diagnoses:  Principal Problem:   Incisional hernia Active Problems:   Ventral incisional hernia   Discharged Condition: good  Hospital Course: patient admitted after open incisional hernia repair with mesh.  Post op course uneventful.  Pain controlled.  Voiding.  Ambulating.  Tolerating diet.  Prepared for discharge home on POD#3.  Consults: None  Treatments: surgery: open ventral incisional hernia repair with mesh  Discharge Exam: Blood pressure 112/62, pulse 84, temperature 98 F (36.7 C), temperature source Oral, resp. rate 15, height 6' (1.829 m), weight 112.5 kg (248 lb), SpO2 97 %. HEENT - clear Neck - soft Chest - clear bilaterally Cor - RRR Abd - dressing removed; drains with thin serous; wound dry and intact with staples  Disposition: Home  Discharge Instructions    Change dressing (specify)    Complete by:  As directed    Cover wound and drain sites with dry gauze once daily.  Wear abdominal binder all times except for short periods of rest while laying recumbent.   Diet - low sodium heart healthy    Complete by:  As directed    Discharge instructions    Complete by:  As directed    [facility]  HERNIA REPAIR POST OP INSTRUCTIONS  Always review your discharge instruction sheet given to you by the facility where your surgery was performed.  A  prescription for pain medication may be given to you upon discharge.  Take your pain medication as prescribed.  If narcotic pain medicine is not needed, then you may take acetaminophen (Tylenol) or ibuprofen (Advil) as needed.  Take your usually prescribed medications unless otherwise directed.  If you need a refill on your pain medication, please contact your  pharmacy.  They will contact our office to request authorization. Prescriptions will not be filled after 5 pm daily or on weekends.  You should follow a light diet the first 24 hours after arrival home, such as soup and crackers or toast.  Be sure to include plenty of fluids daily.  Resume your normal diet the day after surgery.  Most patients will experience some swelling and bruising around the surgical site.  Ice packs and reclining will help.  Swelling and bruising can take several days to resolve.   It is common to experience some constipation if taking pain medication after surgery.  Increasing fluid intake and taking a stool softener (such as Colace) will usually help or prevent this problem from occurring.  A mild laxative (Milk of Magnesia or Miralax) should be taken according to package directions if there are no bowel movements after 48 hours.  Unless discharge instructions indicate otherwise, you may remove your bandages 24-48 hours after surgery, and you may shower at that time.  You will have steri-strips (small skin tapes) in place directly over the incision.  These strips should be left on the skin for 5-7 days.  Any sutures or staples will be removed at the office during your follow-up visit.  ACTIVITIES:  You may resume regular (light) daily activities beginning the next day - such as daily self-care, walking, climbing stairs - gradually increasing activities as tolerated.  You may have sexual intercourse when it is comfortable.  Refrain from any heavy lifting or straining until approved by your doctor.  You may  drive when you are no longer taking prescription pain medication, you can comfortably wear a seatbelt, and you can safely maneuver your car and apply brakes.  You should see your doctor in the office for a follow-up appointment approximately 2-3 weeks after your surgery.  Make sure that you call for this appointment within a day or two after you arrive home to insure a  convenient appointment time.   WHEN TO CALL YOUR DOCTOR: Fever greater than 101.0 Inability to urinate Persistent nausea and/or vomiting Extreme swelling or bruising Continued bleeding from incision Increased pain, redness, or drainage from the incision  The clinic staff is available to answer your questions during regular business hours.  Please don't hesitate to call and ask to speak to one of the nurses for clinical concerns.  If you have a medical emergency, go to the nearest emergency room or call 911.  A surgeon from [facility] is always on call for the hospital.   [facility] [address] [place], [place], [place]  [address]  [phone] ? [phone] ? FAX [phone]  [email or web address]   Increase activity slowly    Complete by:  As directed        Medication List    TAKE these medications   ALEVE 220 MG tablet Generic drug:  naproxen sodium Take 440 mg by mouth every evening.   aspirin EC 81 MG tablet Take 81 mg by mouth at bedtime.   carvedilol 25 MG tablet Commonly known as:  COREG Take 0.5 tablets (12.5 mg total) by mouth 2 (two) times daily with a meal. What changed:  how much to take   clobetasol ointment 0.05 % Commonly known as:  TEMOVATE Apply 1 application topically daily as needed (for peeling skin on hands and feet). Reported on 03/17/2015   [employer] TRIPLE MAGNESIUM COMPLEX PO Take 2 capsules by mouth every evening.   diphenhydrAMINE 25 mg capsule Commonly known as:  BENADRYL Take 50 mg by mouth 2 (two) times daily. Reported on 03/17/2015   HYDROmorphone 4 MG tablet Commonly known as:  DILAUDID Take 4 mg by mouth every 4 (four) hours as needed for severe pain.   HYDROmorphone 2 MG tablet Commonly known as:  DILAUDID Take 1 tablet (2 mg total) by mouth every 3 (three) hours as needed for moderate pain or severe pain.   HYDROmorphone 2 MG tablet Commonly known as:  DILAUDID Take 1  tablet (2 mg total) by mouth every 4 (four) hours as needed for moderate pain or severe pain.   insulin aspart 100 UNIT/ML injection Commonly known as:  NOVOLOG Inject 0-9 Units into the skin 3 (three) times daily before meals. What changed:  how much to take  additional instructions   insulin glargine 100 UNIT/ML injection Commonly known as:  LANTUS Inject 0.2 mLs (20 Units total) into the skin daily. What changed:  how much to take  when to take this   Insulin Syringes (Disposable) U-100 0.5 ML Misc 20 Units by Does not apply route daily.   losartan 50 MG tablet Commonly known as:  COZAAR Take 50 mg by mouth daily.   omeprazole 20 MG capsule Commonly known as:  PRILOSEC Take 20-60 mg by mouth daily as needed (for acid reflux).   ONETOUCH DELICA LANCETS 99991111 Misc 1 Device by Does not apply route 4 (four) times daily.        [name], MD, [facility] Office: [phone]  Signed: [name] 11/15/2015, 9:42 AM

## 2015-11-15 NOTE — Care Management Important Message (Signed)
Important Message  Patient Details  Name: Derek [name] MRN: [id number] Date of Birth: [birth year]   Medicare Important Message Given:  Yes    Orbie Pyo 11/15/2015, 1:27 PM

## 2015-11-17 LAB — AEROBIC/ANAEROBIC CULTURE W GRAM STAIN (SURGICAL/DEEP WOUND)
Culture: NO GROWTH
Gram Stain: NONE SEEN

## 2015-11-18 DIAGNOSIS — E7800 Pure hypercholesterolemia, unspecified: Secondary | ICD-10-CM | POA: Diagnosis not present

## 2015-11-18 DIAGNOSIS — Z794 Long term (current) use of insulin: Secondary | ICD-10-CM | POA: Diagnosis not present

## 2015-11-18 DIAGNOSIS — Z0000 Encounter for general adult medical examination without abnormal findings: Secondary | ICD-10-CM | POA: Diagnosis not present

## 2015-11-18 DIAGNOSIS — Z125 Encounter for screening for malignant neoplasm of prostate: Secondary | ICD-10-CM | POA: Diagnosis not present

## 2015-11-18 DIAGNOSIS — R110 Nausea: Secondary | ICD-10-CM | POA: Diagnosis not present

## 2015-11-18 DIAGNOSIS — E1165 Type 2 diabetes mellitus with hyperglycemia: Secondary | ICD-10-CM | POA: Diagnosis not present

## 2015-11-18 DIAGNOSIS — Z23 Encounter for immunization: Secondary | ICD-10-CM | POA: Diagnosis not present

## 2015-11-18 MED FILL — ONDANSETRON ODT 4 MG TABLET: 4 | 30 days supply | Qty: 90 | Fill #0

## 2015-11-19 ENCOUNTER — Encounter: Payer: Self-pay | Admitting: Radiology

## 2015-11-25 MED FILL — HYDROmorphone HCL 4 MG TABS: 4 | 5 days supply | Qty: 60 | Fill #0

## 2015-11-25 MED FILL — ONETOUCH VERIO TEST STRIP: 33 days supply | Qty: 100 | Fill #3

## 2015-12-06 MED FILL — LOSARTAN POTASSIUM 50 MG TA: 50 | 90 days supply | Qty: 90 | Fill #0

## 2015-12-08 MED FILL — SURE COMFORT 0.5 ML SYRINGE: 30G X 1/2" | 25 days supply | Qty: 100 | Fill #0

## 2015-12-08 MED FILL — LANTUS 100 UNITS/ML VIAL: 100 | 50 days supply | Qty: 20 | Fill #1

## 2015-12-08 MED FILL — NovoLOG 100 UNIT/ML SOLN: 100 | 28 days supply | Qty: 10 | Fill #6

## 2015-12-30 DIAGNOSIS — E119 Type 2 diabetes mellitus without complications: Secondary | ICD-10-CM | POA: Diagnosis not present

## 2015-12-30 DIAGNOSIS — I10 Essential (primary) hypertension: Secondary | ICD-10-CM | POA: Diagnosis not present

## 2015-12-30 DIAGNOSIS — R809 Proteinuria, unspecified: Secondary | ICD-10-CM | POA: Diagnosis not present

## 2016-01-04 MED FILL — SURE COMFORT 0.5 ML SYRINGE: 30G X 1/2" | 25 days supply | Qty: 100 | Fill #1

## 2016-01-04 MED FILL — ONETOUCH VERIO TEST STRIP: 33 days supply | Qty: 100 | Fill #4

## 2016-01-11 MED FILL — NovoLOG 100 UNIT/ML SOLN: 100 | 28 days supply | Qty: 10 | Fill #7

## 2016-01-18 DIAGNOSIS — N401 Enlarged prostate with lower urinary tract symptoms: Secondary | ICD-10-CM | POA: Diagnosis not present

## 2016-01-18 DIAGNOSIS — N138 Other obstructive and reflux uropathy: Secondary | ICD-10-CM | POA: Diagnosis not present

## 2016-01-18 DIAGNOSIS — R350 Frequency of micturition: Secondary | ICD-10-CM | POA: Diagnosis not present

## 2016-01-18 MED FILL — FINASTERIDE 5 MG TABLET: 5 | 30 days supply | Qty: 30 | Fill #0

## 2016-01-18 MED FILL — ALFUZOSIN HCL ER 10 MG TAB: 10 | 30 days supply | Qty: 30 | Fill #0

## 2016-01-24 MED FILL — LANTUS 100 UNITS/ML VIAL: 100 | 50 days supply | Qty: 20 | Fill #2

## 2016-02-10 MED FILL — NovoLOG 100 UNIT/ML SOLN: 100 | 28 days supply | Qty: 10 | Fill #8

## 2016-02-10 MED FILL — ALFUZOSIN HCL ER 10 MG TAB: 10 | 30 days supply | Qty: 30 | Fill #1

## 2016-02-10 MED FILL — FINASTERIDE 5 MG TABLET: 5 | 30 days supply | Qty: 30 | Fill #1

## 2016-02-21 MED FILL — ONETOUCH VERIO TEST STRIP: 33 days supply | Qty: 100 | Fill #5

## 2016-02-21 MED FILL — SURE COMFORT 0.5 ML SYRINGE: 30G X 1/2" | 25 days supply | Qty: 100 | Fill #2

## 2016-02-22 MED FILL — CARVEDILOL 25 MG TABLET: 25 | 90 days supply | Qty: 180 | Fill #0

## 2016-03-10 MED FILL — LOSARTAN POTASSIUM 50 MG TA: 50 | 90 days supply | Qty: 90 | Fill #1

## 2016-03-10 MED FILL — FINASTERIDE 5 MG TABLET: 5 | 30 days supply | Qty: 30 | Fill #2

## 2016-03-13 MED FILL — LANTUS 100 UNITS/ML VIAL: 100 | 50 days supply | Qty: 20 | Fill #3

## 2016-03-15 MED FILL — NovoLOG 100 UNIT/ML SOLN: 100 | 28 days supply | Qty: 10 | Fill #0

## 2016-03-16 DIAGNOSIS — R079 Chest pain, unspecified: Secondary | ICD-10-CM | POA: Diagnosis not present

## 2016-03-16 DIAGNOSIS — R208 Other disturbances of skin sensation: Secondary | ICD-10-CM | POA: Diagnosis not present

## 2016-03-17 MED FILL — HYDROmorphone HCL 4 MG TABS: 4 | 4 days supply | Qty: 30 | Fill #0

## 2016-03-21 MED FILL — ALFUZOSIN HCL ER 10 MG TAB: 10 | 30 days supply | Qty: 30 | Fill #2

## 2016-03-28 MED FILL — SURE COMFORT 0.5 ML SYRINGE: 30G X 1/2" | 25 days supply | Qty: 100 | Fill #3

## 2016-03-30 ENCOUNTER — Other Ambulatory Visit: Payer: Self-pay | Admitting: Surgery

## 2016-03-30 DIAGNOSIS — R1900 Intra-abdominal and pelvic swelling, mass and lump, unspecified site: Secondary | ICD-10-CM

## 2016-04-04 ENCOUNTER — Inpatient Hospital Stay: Admission: RE | Admit: 2016-04-04 | Payer: PPO | Source: Ambulatory Visit

## 2016-04-04 ENCOUNTER — Inpatient Hospital Stay
Admission: RE | Admit: 2016-04-04 | Discharge: 2016-04-04 | Disposition: A | Discharge status: Home / Self Care | Payer: PPO | Source: Ambulatory Visit | Attending: Surgery | Admitting: Surgery

## 2016-04-11 MED FILL — NovoLOG 100 UNIT/ML SOLN: 100 | 28 days supply | Qty: 10 | Fill #1

## 2016-04-11 MED FILL — ONETOUCH VERIO TEST STRIP: 33 days supply | Qty: 100 | Fill #6

## 2016-04-18 ENCOUNTER — Ambulatory Visit
Admission: RE | Admit: 2016-04-18 | Discharge: 2016-04-18 | Disposition: A | Discharge status: Home / Self Care | Payer: PPO | Source: Ambulatory Visit | Attending: Surgery | Admitting: Surgery

## 2016-04-18 DIAGNOSIS — C259 Malignant neoplasm of pancreas, unspecified: Secondary | ICD-10-CM | POA: Diagnosis not present

## 2016-04-18 DIAGNOSIS — R1900 Intra-abdominal and pelvic swelling, mass and lump, unspecified site: Secondary | ICD-10-CM

## 2016-04-18 MED ORDER — IOPAMIDOL (ISOVUE-300) INJECTION 61%
125.0000 mL | Freq: Once | INTRAVENOUS | Status: AC | PRN
  Administered 2016-04-18: 125 mL via INTRAVENOUS

## 2016-04-21 MED FILL — FINASTERIDE 5 MG TABLET: 5 | 30 days supply | Qty: 30 | Fill #3

## 2016-04-21 MED FILL — ALFUZOSIN HCL ER 10 MG TAB: 10 | 30 days supply | Qty: 30 | Fill #3

## 2016-05-03 MED FILL — LANTUS 100 UNITS/ML VIAL: 100 | 22 days supply | Qty: 10 | Fill #0

## 2016-05-03 MED FILL — SURE COMFORT 0.5 ML SYRINGE: 30G X 1/2" | 25 days supply | Qty: 100 | Fill #4

## 2016-05-09 MED FILL — NovoLOG 100 UNIT/ML SOLN: 100 | 28 days supply | Qty: 10 | Fill #2

## 2016-05-16 DIAGNOSIS — R208 Other disturbances of skin sensation: Secondary | ICD-10-CM | POA: Diagnosis not present

## 2016-05-22 MED FILL — CELECOXIB 200 MG CAPSULE: 200 | 21 days supply | Qty: 21 | Fill #0

## 2016-05-24 DIAGNOSIS — R809 Proteinuria, unspecified: Secondary | ICD-10-CM | POA: Diagnosis not present

## 2016-05-24 DIAGNOSIS — I10 Essential (primary) hypertension: Secondary | ICD-10-CM | POA: Diagnosis not present

## 2016-05-24 DIAGNOSIS — E119 Type 2 diabetes mellitus without complications: Secondary | ICD-10-CM | POA: Diagnosis not present

## 2016-05-24 MED FILL — ULTCARE INS SYR 1 ML 31GX5/: 31G X 5/16" | 33 days supply | Qty: 200 | Fill #0

## 2016-05-25 MED FILL — LANTUS 100 UNITS/ML VIAL: 100 | 23 days supply | Qty: 20 | Fill #0

## 2016-05-26 MED FILL — FINASTERIDE 5 MG TABLET: 5 | 30 days supply | Qty: 30 | Fill #4

## 2016-05-26 MED FILL — ALFUZOSIN HCL ER 10 MG TAB: 10 | 30 days supply | Qty: 30 | Fill #4

## 2016-05-26 MED FILL — ONETOUCH VERIO TEST STRIP: 34 days supply | Qty: 100 | Fill #7

## 2016-06-01 MED FILL — NovoLOG 100 UNIT/ML SOLN: 100 | 42 days supply | Qty: 20 | Fill #0

## 2016-06-08 MED FILL — CELECOXIB 200 MG CAPSULE: 200 | 21 days supply | Qty: 21 | Fill #1

## 2016-06-14 MED FILL — LANTUS 100 UNITS/ML VIAL: 100 | 23 days supply | Qty: 20 | Fill #1

## 2016-06-14 MED FILL — SURE COMFORT 0.5 ML SYRINGE: 30G X 1/2" | 25 days supply | Qty: 100 | Fill #5

## 2016-06-26 MED FILL — FINASTERIDE 5 MG TABLET: 5 | 30 days supply | Qty: 30 | Fill #5

## 2016-06-26 MED FILL — ALFUZOSIN HCL ER 10 MG TAB: 10 | 30 days supply | Qty: 30 | Fill #5

## 2016-06-26 MED FILL — LOSARTAN POTASSIUM 50 MG TA: 50 | 90 days supply | Qty: 90 | Fill #0

## 2016-06-28 MED FILL — CELECOXIB 200 MG CAPSULE: 200 | 21 days supply | Qty: 21 | Fill #0

## 2016-07-03 MED FILL — LANTUS 100 UNITS/ML VIAL: 100 | 23 days supply | Qty: 20 | Fill #2

## 2016-07-06 MED FILL — ONETOUCH VERIO TEST STRIP: 34 days supply | Qty: 100 | Fill #8

## 2016-07-11 DIAGNOSIS — N401 Enlarged prostate with lower urinary tract symptoms: Secondary | ICD-10-CM | POA: Diagnosis not present

## 2016-07-18 DIAGNOSIS — N138 Other obstructive and reflux uropathy: Secondary | ICD-10-CM | POA: Diagnosis not present

## 2016-07-18 DIAGNOSIS — N401 Enlarged prostate with lower urinary tract symptoms: Secondary | ICD-10-CM | POA: Diagnosis not present

## 2016-07-21 MED FILL — LANTUS 100 UNITS/ML VIAL: 100 | 37 days supply | Qty: 40 | Fill #0

## 2016-07-25 ENCOUNTER — Emergency Department (INDEPENDENT_AMBULATORY_CARE_PROVIDER_SITE_OTHER)
Admission: EM | Admit: 2016-07-25 | Discharge: 2016-07-25 | Disposition: A | Discharge status: Home / Self Care | Payer: PPO | Source: Home / Self Care | Attending: Family Medicine | Admitting: Family Medicine

## 2016-07-25 ENCOUNTER — Emergency Department (INDEPENDENT_AMBULATORY_CARE_PROVIDER_SITE_OTHER): Payer: PPO

## 2016-07-25 ENCOUNTER — Encounter: Payer: Self-pay | Admitting: *Deleted

## 2016-07-25 DIAGNOSIS — G4452 New daily persistent headache (NDPH): Secondary | ICD-10-CM | POA: Diagnosis not present

## 2016-07-25 DIAGNOSIS — G319 Degenerative disease of nervous system, unspecified: Secondary | ICD-10-CM | POA: Diagnosis not present

## 2016-07-25 DIAGNOSIS — R51 Headache: Secondary | ICD-10-CM | POA: Diagnosis not present

## 2016-07-25 DIAGNOSIS — R9082 White matter disease, unspecified: Secondary | ICD-10-CM

## 2016-07-25 NOTE — ED Triage Notes (Signed)
Pt c/o HA's intermittently x 1 month. Most recent HA began last night. Last dose Naproxen 2 tabs at 1000 today. He also vomit x 2 this morning.

## 2016-07-25 NOTE — ED Provider Notes (Signed)
Derek [name]    CSN: [id number] Arrival date & time: 07/25/16  1003     History   Chief Complaint Chief Complaint  Patient presents with  . Headache    HPI Derek [name] is a 71 y.o. male.   Patient complains of intermittently recurring mid-parietal headaches for one month.  The headaches awaken him at night, when they are especially severe.  He reports that his vision is blurred during a severe headache, and last night he had an episode of nausea/vomiting.  No localizing neurologic symptoms. He had similar headaches about 13 years ago that eventually resolved spontaneously.  An MRI head at that time was negative.  He has a history of pancreatic CA treated surgically.   The history is provided by the patient and the spouse.  Headache  Pain location:  L temporal and R temporal Quality:  Dull Radiates to:  Does not radiate Severity currently:  3/10 Severity at highest:  10/10 Onset quality:  Sudden Duration:  4 weeks Timing:  Intermittent Progression:  Worsening Chronicity:  Recurrent Similar to prior headaches: yes   Context: bright light   Relieved by:  Nothing Worsened by:  Light Ineffective treatments:  Prescription medications Associated symptoms: blurred vision, dizziness, loss of balance, nausea, photophobia, visual change and vomiting   Associated symptoms: no abdominal pain, no congestion, no drainage, no ear pain, no eye pain, no facial pain, no fatigue, no fever, no focal weakness, no hearing loss, no myalgias, no near-syncope, no neck pain, no neck stiffness, no numbness, no paresthesias, no seizures, no sinus pressure, no sore throat, no swollen glands, no syncope, no tingling, no URI and no weakness     Past Medical History:  Diagnosis Date  . Anxiety    prone to "panic attacks"  . Arthritis   . BPH (benign prostatic hyperplasia)   . Cancer ([place]) 2017   newly diagnosed pancreatic cancer- treated surgically  . 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 DDD (degenerative disc disease), lumbar    gets back injections   . Depression   . Dermatitis    bilateral hands  . Diabetes mellitus   . GERD (gastroesophageal reflux disease)   . 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 Hearing loss in right ear 05-22-13   completely deaf right ear-Meniere's disease  . History of kidney stones    multiple kidney stones-not a problem at present  . Hypertension   . Meniere's disease   . Morbid obesity ([place])   . 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 Obstructive sleep apnea    " i used to have sleep apnea" never used a c-pap   . Shortness of breath dyspnea    with exertion   . Straining on urination   . 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 Ventral incisional hernia 11/12/2015  . Incisional hernia 11/10/2015  . 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 Pleural effusion on left   . Lactose intolerance in adult 04/04/2015  . Protein-calorie malnutrition, moderate ([place]) 04/04/2015  . Pleural effusion, left   . Debility   . Benign essential HTN   . Leukocytosis   . Thrombocytosis ([place])   . Intra-abdominal abscess ([place])   . Poorly controlled type 2 diabetes mellitus ([place])   . 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 OA (osteoarthritis) of knee 06/02/2013  . Anal fissure 02/04/2013  . Meniere's disease   . GERD (gastroesophageal reflux disease)   . Kidney stone   . Hypertension   . Obstructive sleep apnea   . Morbid obesity ([place])   . Synovitis of knee 03/01/2012  . Chronic cough 11/03/2011  . COSTOCHONDRITIS, LEFT 10/08/2009  . RIB PAIN, LEFT SIDED 10/08/2009    Past Surgical History:  Procedure Laterality Date  . ANAL FISTULECTOMY N/A 04/01/2013   Procedure: EXAM UNDER ANESTHESIA WITH ANAL FISSURectomy, sphincterotomy and internal hemorrhoidectomy;  Surgeon: Harl Bowie, MD;   Location: [place];  Service: General;  Laterality: N/A;  . ANKLE SURGERY Left 2010   left-fx- some retained hardware as of 05-22-13  . APPLICATION OF WOUND VAC N/A 05/16/2015   Procedure: APPLICATION OF WOUND VAC;  Surgeon: Armandina Gemma, MD;  Location: [facility];  Service: General;  Laterality: N/A;  . APPLICATION OF WOUND VAC N/A 05/20/2015   Procedure: EXHANGE OF ABDOMINAL  WOUND VAC DRESSING;  Surgeon: Armandina Gemma, MD;  Location: [facility];  Service: General;  Laterality: N/A;  . COLONOSCOPY    . EUS N/A 12/31/2014   Procedure: UPPER ENDOSCOPIC ULTRASOUND (EUS) LINEAR;  Surgeon: Milus Banister, MD;  [facility];  Service: Endoscopy;  Laterality: N/A;  . HARDWARE REMOVAL Left 2013   ankle-lt  . INCISION AND DRAINAGE ABSCESS N/A 05/14/2015   Procedure: DRAINAGE OF INFECTED PANCREATIC PSEUDOCYST;  Surgeon: Armandina Gemma, MD;  [facility]: [facility];  Service: General;  Laterality: N/A;  . INCISION AND DRAINAGE OF WOUND N/A 05/16/2015   Procedure: IRRIGATION AND DEBRIDEMENT WOUND;  Surgeon: Armandina Gemma, MD;  [facility]: [facility];  Service: General;  Laterality: N/A;  . INCISIONAL HERNIA REPAIR N/A 11/12/2015   Procedure: [place];  Surgeon: Armandina Gemma, MD;  Location: [facility];  Service: General;  Laterality: N/A;  . INGUINAL HERNIA REPAIR Right '74,'82   x 2, inguinal  . INSERTION OF MESH N/A 11/12/2015   Procedure: INSERTION OF MESH;  Surgeon: Armandina Gemma, MD;  Location: [place];  Service: General;  Laterality: N/A;  . IR GENERIC HISTORICAL  04/20/2015   IR RADIOLOGIST EVAL & MGMT 04/20/2015 [facility] INTERV RAD  . KNEE ARTHROSCOPY Right 2002   rt knee  . KNEE ARTHROSCOPY  03/01/2012   Procedure: ARTHROSCOPY KNEE;  Surgeon: Gearlean Alf, MD;  [facility];  Service: Orthopedics;  Laterality: Left;  WITH SYNOVECTOMY   . LAPAROTOMY N/A 05/14/2015   Procedure: EXPLORATORY LAPAROTOMY;  Surgeon: Armandina Gemma, MD;  [facility];  Service:  General;  Laterality: N/A;  . REPLACEMENT TOTAL KNEE Left 2009   knee-left  . ROTATOR CUFF REPAIR  '93bilateral '94 lt   x3  . SPLENECTOMY    . TOTAL KNEE ARTHROPLASTY Right 06/02/2013   Procedure: RIGHT TOTAL KNEE ARTHROPLASTY;  Surgeon: Gearlean Alf, MD;  [facility];  Service: Orthopedics;  Laterality: Right;  . UMBILICAL HERNIA REPAIR  2000  . UPPER GASTROINTESTINAL ENDOSCOPY  sept 2016       Home Medications    Prior to Admission medications   Medication Sig Start Date End Date Taking? Authorizing Provider  aspirin EC 81 MG tablet Take 81 mg by mouth at bedtime.    [provider]  carvedilol (COREG) 25 MG tablet Take 0.5 tablets (12.5 mg total) by mouth 2 (two) times daily with a meal. Patient taking differently: Take 25  mg by mouth 2 (two) times daily with a meal.  04/06/15   Michael Boston, MD  clobetasol ointment (TEMOVATE) 6.76 % Apply 1 application topically daily as needed (for peeling skin on hands and feet). Reported on 03/17/2015    [provider]  [employer] TRIPLE MAGNESIUM COMPLEX PO Take 2 capsules by mouth every evening.     [provider]  diphenhydrAMINE (BENADRYL) 25 mg capsule Take 50 mg by mouth 2 (two) times daily. Reported on 03/17/2015    [provider]  HYDROmorphone (DILAUDID) 2 MG tablet Take 1 tablet (2 mg total) by mouth every 3 (three) hours as needed for moderate pain or severe pain. Patient not taking: Reported on 11/09/2015 03/03/15   Armandina Gemma, MD  HYDROmorphone (DILAUDID) 2 MG tablet Take 1 tablet (2 mg total) by mouth every 4 (four) hours as needed for moderate pain or severe pain. Patient not taking: Reported on 07/06/2015 04/05/15   Armandina Gemma, MD  HYDROmorphone (DILAUDID) 4 MG tablet Take 4 mg by mouth every 4 (four) hours as needed for severe pain.    [provider]  insulin aspart (NOVOLOG) 100 UNIT/ML injection Inject 0-9 Units into the skin 3 (three) times daily before meals. Patient taking  differently: Inject 5-14 Units into the skin 3 (three) times daily before meals. Based on sliding scale 03/03/15   Armandina Gemma, MD  insulin glargine (LANTUS) 100 UNIT/ML injection Inject 0.2 mLs (20 Units total) into the skin daily. Patient taking differently: Inject 40 Units into the skin at bedtime.  03/03/15   Armandina Gemma, MD  Insulin Syringes, Disposable, U-100 0.5 ML MISC 20 Units by Does not apply route daily. Patient not taking: Reported on 11/12/2015 03/03/15   Armandina Gemma, MD  losartan (COZAAR) 50 MG tablet Take 50 mg by mouth daily.    [provider]  naproxen sodium (ALEVE) 220 MG tablet Take 440 mg by mouth every evening.     [provider]  omeprazole (PRILOSEC) 20 MG capsule Take 20-60 mg by mouth daily as needed (for acid reflux).     [provider]  [facility] DELICA LANCETS 72C MISC 1 Device by Does not apply route 4 (four) times daily. Patient not taking: Reported on 11/12/2015 03/17/15   Renato Shin, MD    Family History Family History  Problem Relation Age of Onset  . Bone cancer Father        Jaw  . Factor V Leiden deficiency Daughter   . Diabetes Maternal Grandmother   . Heart disease Paternal Uncle   . Colon cancer Neg Hx   . Esophageal cancer Neg Hx   . Stomach cancer Neg Hx   . Rectal cancer Neg Hx     Social History Social History  Substance Use Topics  . Smoking status: Former Smoker    Packs/day: 4.00    Years: 17.00    Types: Cigarettes    Quit date: 02/21/1975  . Smokeless tobacco: Never Used  . Alcohol use No     Allergies   Oxycontin [oxycodone hcl]; Quinine; Voltaren [diclofenac sodium]; and Morphine and related   Review of Systems Review of Systems  Constitutional: Negative for fatigue and fever.  HENT: Negative for congestion, ear pain, hearing loss, postnasal drip, sinus pressure and sore throat.   Eyes: Positive for blurred vision and photophobia. Negative for pain.  Cardiovascular: Negative for syncope and  near-syncope.  Gastrointestinal: Positive for nausea and vomiting. Negative for abdominal pain.  Musculoskeletal: Negative for  myalgias, neck pain and neck stiffness.  Neurological: Positive for dizziness, headaches and loss of balance. Negative for focal weakness, seizures, weakness, numbness and paresthesias.  All other systems reviewed and are negative.    Physical Exam Triage Vital Signs ED Triage Vitals  Enc Vitals Group     BP 07/25/16 1026 112/77     Pulse Rate 07/25/16 1026 80     Resp 07/25/16 1026 18     Temp 07/25/16 1026 98.5 F (36.9 C)     Temp Source 07/25/16 1026 Oral     SpO2 07/25/16 1026 96 %     Weight 07/25/16 1026 260 lb (117.9 kg)     Height 07/25/16 1026 6' (1.829 m)     Head Circumference --      Peak Flow --      Pain Score 07/25/16 1027 7     Pain Loc --      Pain Edu? --      Excl. in [place]? --    No data found.   Updated Vital Signs BP 112/77 (BP Location: Left Arm)   Pulse 80   Temp 98.5 F (36.9 C) (Oral)   Resp 18   Ht 6' (1.829 m)   Wt 260 lb (117.9 kg)   SpO2 96%   BMI 35.26 kg/m   Visual Acuity Right Eye Distance:   Left Eye Distance:   Bilateral Distance:    Right Eye Near:   Left Eye Near:    Bilateral Near:     Physical Exam  Constitutional: He is oriented to person, place, and time. He appears well-developed and well-nourished. No distress.  HENT:  Head: Normocephalic and atraumatic.  Right Ear: Tympanic membrane, external ear and ear canal normal.  Left Ear: Tympanic membrane, external ear and ear canal normal.  Nose: Nose normal.  Mouth/Throat: Oropharynx is clear and moist.  Eyes: Conjunctivae and EOM are normal. Pupils are equal, round, and reactive to light.  Fundi benign  Neck: Normal range of motion. Neck supple.  Cardiovascular: Normal rate and normal heart sounds.   Pulmonary/Chest: Breath sounds normal.  Abdominal: Bowel sounds are normal. There is tenderness.  Tenderness over transverse mid-abdominal  surgical scar.  Musculoskeletal: He exhibits no edema.  Lymphadenopathy:    He has no cervical adenopathy.  Neurological: He is alert and oriented to person, place, and time. He displays normal reflexes. No cranial nerve deficit or sensory deficit. He exhibits normal muscle tone. Coordination normal.  Skin: Skin is warm and dry.  Nursing note and vitals reviewed.    UC Treatments / Results  Labs (all labs ordered are listed, but only abnormal results are displayed) Labs Reviewed - No data to display  EKG  EKG Interpretation None       Radiology Ct Head Wo Contrast  Result Date: 07/25/2016 CLINICAL DATA:  Recurring parietal headache EXAM: CT HEAD WITHOUT CONTRAST TECHNIQUE: Contiguous axial images were obtained from the base of the skull through the vertex without intravenous contrast. COMPARISON:  None available FINDINGS: Brain: Age related brain atrophy and mild chronic white matter microvascular ischemic changes throughout the cerebral hemispheres bilaterally. No acute intracranial hemorrhage, definite mass lesion, new infarction, midline shift, herniation, hydrocephalus, or extra-axial fluid collection. No focal mass effect or edema. Cisterns are patent. Cerebellar atrophy as well. Vascular: No hyperdense vessel or unexpected calcification. Skull: Normal. Negative for fracture or focal lesion. Sinuses/Orbits: Symmetric normal appearing orbits. Chronic right maxillary sinus disease with near complete opacification. Other sinuses remain  clear. Other: None. IMPRESSION: Age related brain atrophy and chronic white matter microvascular ischemic changes. No acute intracranial abnormality by noncontrast CT. Electronically Signed   By: Jerilynn Mages.  Shick M.D.   On: 07/25/2016 11:40    Procedures Procedures (including critical care time)  Medications Ordered in UC Medications - No data to display   Initial Impression / Assessment and Plan / UC Course  I have reviewed the triage vital signs and the  nursing notes.  Pertinent labs & imaging results that were available during my care of the patient were reviewed by me and considered in my medical decision making (see chart for details).    Severe nocturnal headaches ?etiology.  ?OSA.  Note chronic right maxillary sinus disease on CT; however, patient has no facial pain, or tenderness over right maxillary sinuses. Will refer to neurology for evaluation/treatment.    Final Clinical Impressions(s) / UC Diagnoses   Final diagnoses:  New daily persistent headache    New Prescriptions New Prescriptions   No medications on file     Kandra Nicolas, MD 07/25/16 1211

## 2016-07-26 ENCOUNTER — Telehealth: Payer: Self-pay

## 2016-07-26 NOTE — Telephone Encounter (Signed)
Pt feeling better, Headache went away yesterday.  Will follow up as needed.

## 2016-07-28 MED FILL — SURE COMFORT 0.5 ML SYRINGE: 30G X 1/2" | 25 days supply | Qty: 100 | Fill #0

## 2016-07-28 MED FILL — NovoLOG 100 UNIT/ML SOLN: 100 | 42 days supply | Qty: 20 | Fill #1

## 2016-08-14 MED FILL — ONETOUCH VERIO TEST STRIP: 34 days supply | Qty: 100 | Fill #0

## 2016-08-16 DIAGNOSIS — R300 Dysuria: Secondary | ICD-10-CM | POA: Diagnosis not present

## 2016-08-16 DIAGNOSIS — N138 Other obstructive and reflux uropathy: Secondary | ICD-10-CM | POA: Diagnosis not present

## 2016-08-16 DIAGNOSIS — N401 Enlarged prostate with lower urinary tract symptoms: Secondary | ICD-10-CM | POA: Diagnosis not present

## 2016-08-16 MED FILL — CEPHALEXIN 250 MG CAPSULE: 250 | 5 days supply | Qty: 15 | Fill #0

## 2016-08-22 ENCOUNTER — Other Ambulatory Visit: Payer: Self-pay | Admitting: Urology

## 2016-08-24 DIAGNOSIS — M5442 Lumbago with sciatica, left side: Secondary | ICD-10-CM | POA: Diagnosis not present

## 2016-08-24 DIAGNOSIS — G8929 Other chronic pain: Secondary | ICD-10-CM | POA: Diagnosis not present

## 2016-08-24 MED FILL — HYDROmorphone HCL 4 MG TABS: 4 | 30 days supply | Qty: 60 | Fill #0

## 2016-08-25 DIAGNOSIS — H35372 Puckering of macula, left eye: Secondary | ICD-10-CM | POA: Diagnosis not present

## 2016-08-29 ENCOUNTER — Ambulatory Visit: Payer: Self-pay | Admitting: Surgery

## 2016-08-29 DIAGNOSIS — M6020 Foreign body granuloma of soft tissue, not elsewhere classified, unspecified site: Secondary | ICD-10-CM | POA: Diagnosis not present

## 2016-09-01 DIAGNOSIS — N401 Enlarged prostate with lower urinary tract symptoms: Secondary | ICD-10-CM | POA: Diagnosis not present

## 2016-09-01 DIAGNOSIS — R3914 Feeling of incomplete bladder emptying: Secondary | ICD-10-CM | POA: Diagnosis not present

## 2016-09-01 DIAGNOSIS — R300 Dysuria: Secondary | ICD-10-CM | POA: Diagnosis not present

## 2016-09-01 MED FILL — TAMSULOSIN HCL 0.4 MG CAP: 0.4 | 30 days supply | Qty: 30 | Fill #0

## 2016-09-04 ENCOUNTER — Encounter: Payer: Self-pay | Admitting: Surgery

## 2016-09-04 DIAGNOSIS — S30851A Superficial foreign body of abdominal wall, initial encounter: Secondary | ICD-10-CM | POA: Diagnosis present

## 2016-09-04 MED FILL — AMOX-CLAV 875-125 MG TABLET: 875-125 | 7 days supply | Qty: 14 | Fill #0

## 2016-09-04 NOTE — H&P (Signed)
General Surgery [facility]  Derek [name] 08/29/2016 1:36 PM Location: [facility] Patient #: [id number] DOB: 02-26-1945 Married / Language: English / Race: White Male   History of Present Illness [name] MD; 08/29/2016 2:00 PM) The patient is a 71 year old male who presents with a complaint of a foreign body.  CC: chronic wound drainage  Patient returns today accompanied by his wife. He continues to have intermittent chronic drainage from his subcostal incision at 2 points just to the left of midline. Patient reports a tan-white drainage with occasional intermixed blood. Mild discomfort. No fever. Appetite is good. Bowel function is normal. He presents today for assessment.   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 Hemorrhoidectomy  Knee Surgery  Bilateral. Open Inguinal Hernia Surgery  Bilateral. Shoulder Surgery  Bilateral.  Diagnostic Studies History [name], MD; 08/29/2016 2:04 PM) Colonoscopy  1-5 years ago  Allergies Malachy Moan, RMA; 08/29/2016 1:36 PM) QuiNINE Sulfate *ANTIMALARIALS*  Voltaren-XR *ANALGESICS - ANTI-INFLAMMATORY*  Morphine Sulfate *ANALGESICS - OPIOID*  OxyCONTIN *ANALGESICS - OPIOID*  Allergies Reconciled   Medication History Malachy Moan, RMA; 08/29/2016 1:39 PM) HYDROmorphone HCl (2MG  Tablet, 4mg  Oral) Active. Mupirocin (2% Ointment, External) Active. Sure Comfort Insulin Syringe (30G X 1/2"0.5 ML Misc,)  Active. Invokana (100MG  Tablet, Oral) Active. Fluconazole (100MG  Tablet, Oral) Active. Aspirin (81MG  Tablet Chewable, Oral) Active. Carvedilol (25MG  Tablet, Oral) Active. Clobetasol Propionate (0.05% Ointment, External) Active. NovoLOG (100UNIT/ML Solution, Subcutaneous) Active. Lantus (100UNIT/ML Solution, Subcutaneous) Active. Losartan Potassium (50MG  Tablet, Oral) Active. NovoLIN N ReliOn (100UNIT/ML Suspension, Subcutaneous) Active. OneTouch Verio (In Vitro) Active. UltiCare Insulin Syringe (31G X 5/16"1 ML Misc,) Active. Vitamin C (500MG  Tablet, Oral) Active. Probiotic (Oral) Active. Medications Reconciled  Social History [name], MD; 08/29/2016 2:04 PM) Alcohol use  Remotely quit alcohol use. No caffeine use  Tobacco use  Former smoker.  Family History [name], MD; 08/29/2016 2:04 PM) Cancer  Father.  Other Problems [name], MD; 08/29/2016 2:04 PM) Anxiety Disorder  Back Pain  Gastroesophageal Reflux Disease  Hemorrhoids  High blood pressure  Inguinal Hernia  Umbilical Hernia Repair   Vitals Malachy Moan RMA; 08/29/2016 1:40 PM) 08/29/2016 1:39 PM Weight: 271 lb Height: 72in Body Surface Area: 2.42 m Body Mass Index: 36.75 kg/m  Temp.: 99.29F  Pulse: 74 (Regular)  BP: 130/80 (Sitting, Left Arm, Standard)       Physical Exam [name] MD; 08/29/2016 2:01 PM) The physical exam findings are as follows: Note:CONSTITUTIONAL See vital signs recorded above  GENERAL APPEARANCE Development: normal Nutritional status: normal Gross deformities: none  SKIN Rash, lesions, ulcers: none Induration, erythema: none Nodules: none palpable  EYES Conjunctiva and lids: normal Pupils: equal and reactive Iris: normal bilaterally  EARS, NOSE, MOUTH, THROAT External ears: no lesion or deformity External nose: no lesion or deformity Hearing: grossly normal Lips: no lesion or deformity Dentition:  normal for age Oral mucosa: moist  NECK Symmetric: yes Trachea: midline Thyroid: no palpable nodules in the thyroid bed  CHEST Respiratory effort: normal Retraction or accessory muscle use:  no Breath sounds: normal bilaterally Rales, rhonchi, wheeze: none  CARDIOVASCULAR Auscultation: regular rhythm, normal rate Murmurs: none Pulses: carotid and radial pulse 2+ palpable Lower extremity edema: none Lower extremity varicosities: none  ABDOMEN Distension: none Masses: none palpable Tenderness: none Hepatosplenomegaly: not present Hernia: not present 2 punctate openings in incision left subcostal margin, granulation present, small serosanguineous drainage, no fluctuance, minimal tenderness.  MUSCULOSKELETAL Station and gait: normal Digits and nails: no clubbing or cyanosis Muscle strength: grossly normal all extremities Range of motion: grossly normal all extremities Deformity: none  LYMPHATIC Cervical: none palpable Supraclavicular: none palpable  PSYCHIATRIC Oriented to person, place, and time: yes Mood and affect: normal for situation Judgment and insight: appropriate for situation    Assessment & Plan [name] MD; 08/29/2016 2:03 PM) FOREIGN BODY REACTION (M60.20) Current Plans Patient presents today accompanied by his wife. There are 2 small areas in his subcostal surgical incision which refused to remain healed. He experiences intermittent drainage and mild discomfort.  I suspect there are chronically infected suture material or mesh at the base of these 2 small cutaneous fistulae. We discussed options for management. Given that the patient is diabetic, he is concerned about having chronic nonhealing wounds. I have recommended local wound exploration and debridement under anesthesia as an outpatient surgical procedure. Patient and his wife are in agreement. He will require dressing changes to the open wound after the procedure. Patient and his wife  wish to proceed.  The risks and benefits of the procedure have been discussed at length with the patient. The patient understands the proposed procedure, potential alternative treatments, and the course of recovery to be expected. All of the patient's questions have been answered at this time. The patient wishes to proceed with surgery.  [name], MD, [facility] Office: [phone]

## 2016-09-11 MED FILL — SURE COMFORT 0.5 ML SYRINGE: 30G X 1/2" | 25 days supply | Qty: 100 | Fill #1

## 2016-09-11 MED FILL — CARVEDILOL 25 MG TABLET: 25 | 90 days supply | Qty: 180 | Fill #1

## 2016-09-13 NOTE — Patient Instructions (Addendum)
Derek [name]  09/13/2016   Your procedure is scheduled on: 09/21/2016    Report to [facility] Main  Entrance Take [name]  elevators to 3rd floor to  [place] at  [place] AM.    Call this number if you have problems the morning of surgery [phone]    Remember: ONLY 1 PERSON MAY GO WITH YOU TO SHORT STAY TO GET  READY MORNING OF Derek [name].  Do not eat food or drink liquids :After Midnight.     Take these medicines the morning of surgery with A SIP OF WATER: none                                 You may not have any metal on your body including hair pins and              piercings  Do not wear jewelry,  lotions, powders or perfumes, deodorant                        Men may shave face and neck.   Do not bring valuables to the hospital. [name].  Contacts, dentures or bridgework may not be worn into surgery. .     Patients discharged the day of surgery will not be allowed to drive home.  Name and phone number of your driver:                Please read over the following fact sheets you were given: _____________________________________________________________________             [facility] - Preparing for Surgery Before surgery, you can play an important role.  Because skin is not sterile, your skin needs to be as free of germs as possible.  You can reduce the number of germs on your skin by washing with CHG (chlorahexidine gluconate) soap before surgery.  CHG is an antiseptic cleaner which kills germs and bonds with the skin to continue killing germs even after washing. Please DO NOT use if you have an allergy to CHG or antibacterial soaps.  If your skin becomes reddened/irritated stop using the CHG and inform your nurse when you arrive at Short Stay. Do not shave (including legs and underarms) for at least 48 hours prior to the first CHG shower.  You may shave your face/neck. Please follow  these instructions carefully:  1.  Shower with CHG Soap the night before surgery and the  morning of Surgery.  2.  If you choose to wash your hair, wash your hair first as usual with your  normal  shampoo.  3.  After you shampoo, rinse your hair and body thoroughly to remove the  shampoo.                           4.  Use CHG as you would any other liquid soap.  You can apply chg directly  to the skin and wash                       Gently with a scrungie or clean washcloth.  5.  Apply the CHG Soap to  your body ONLY FROM THE NECK DOWN.   Do not use on face/ open                           Wound or open sores. Avoid contact with eyes, ears mouth and genitals (private parts).                       Wash face,  Genitals (private parts) with your normal soap.             6.  Wash thoroughly, paying special attention to the area where your surgery  will be performed.  7.  Thoroughly rinse your body with warm water from the neck down.  8.  DO NOT shower/wash with your normal soap after using and rinsing off  the CHG Soap.                9.  Pat yourself dry with a clean towel.            10.  Wear clean pajamas.            11.  Place clean sheets on your bed the night of your first shower and do not  sleep with pets. Day of Surgery : Do not apply any lotions/deodorants the morning of surgery.  Please wear clean clothes to the hospital/surgery center.  FAILURE TO FOLLOW THESE INSTRUCTIONS MAY RESULT IN THE CANCELLATION OF YOUR SURGERY PATIENT SIGNATURE_________________________________  NURSE SIGNATURE__________________________________  ________________________________________________________________________ How to Manage Your Diabetes Before and After Surgery  Why is it important to control my blood sugar before and after surgery? . Improving blood sugar levels before and after surgery helps healing and can limit problems. . A way of improving blood sugar control is eating a healthy diet by: o   Eating less sugar and carbohydrates o  Increasing activity/exercise o  Talking with your doctor about reaching your blood sugar goals . High blood sugars (greater than 180 mg/dL) can raise your risk of infections and slow your recovery, so you will need to focus on controlling your diabetes during the weeks before surgery. . Make sure that the doctor who takes care of your diabetes knows about your planned surgery including the date and location.  How do I manage my blood sugar before surgery? . Check your blood sugar at least 4 times a day, starting 2 days before surgery, to make sure that the level is not too high or low. o Check your blood sugar the morning of your surgery when you wake up and every 2 hours until you get to the Short Stay unit. . If your blood sugar is less than 70 mg/dL, you will need to treat for low blood sugar: o Do not take insulin. o Treat a low blood sugar (less than 70 mg/dL) with  cup of clear juice (cranberry or apple), 4 glucose tablets, OR glucose gel. o Recheck blood sugar in 15 minutes after treatment (to make sure it is greater than 70 mg/dL). If your blood sugar is not greater than 70 mg/dL on recheck, call [phone] for further instructions. . Report your blood sugar to the short stay nurse when you get to Short Stay.  . If you are admitted to the hospital after surgery: o Your blood sugar will be checked by the staff and you will probably be given insulin after surgery (instead of oral diabetes medicines) to make sure you have good  blood sugar levels. o The goal for blood sugar control after surgery is 80-180 mg/dL.   WHAT DO I DO ABOUT MY DIABETES MEDICATION?  Marland Kitchen   . THE NIGHT BEFORE SURGERY, take  45     units of      Humulin N   insulin.       . THE MORNING OF SURGERY, take 36   units of   Humulin N        insulin.  .   .  .   Patient Signature:  Date:   Nurse Signature:  Date:   Reviewed and Endorsed by [facility] Patient Education  Committee, August 2015

## 2016-09-15 ENCOUNTER — Encounter (HOSPITAL_COMMUNITY): Payer: Self-pay

## 2016-09-15 ENCOUNTER — Encounter (HOSPITAL_COMMUNITY)
Admission: RE | Admit: 2016-09-15 | Discharge: 2016-09-15 | Disposition: A | Discharge status: Home / Self Care | Payer: PPO | Source: Ambulatory Visit | Attending: Surgery | Admitting: Surgery

## 2016-09-15 DIAGNOSIS — Z01812 Encounter for preprocedural laboratory examination: Secondary | ICD-10-CM | POA: Insufficient documentation

## 2016-09-15 DIAGNOSIS — Z01810 Encounter for preprocedural cardiovascular examination: Secondary | ICD-10-CM | POA: Diagnosis not present

## 2016-09-15 HISTORY — DX: Peripheral vascular disease, unspecified: I73.9

## 2016-09-15 LAB — CBC
HCT: 37.1 % — ABNORMAL LOW (ref 39.0–52.0)
Hemoglobin: 12.4 g/dL — ABNORMAL LOW (ref 13.0–17.0)
MCH: 26.8 pg (ref 26.0–34.0)
MCHC: 33.4 g/dL (ref 30.0–36.0)
MCV: 80.1 fL (ref 78.0–100.0)
Platelets: 467 10*3/uL — ABNORMAL HIGH (ref 150–400)
RBC: 4.63 MIL/uL (ref 4.22–5.81)
RDW: 16.1 % — ABNORMAL HIGH (ref 11.5–15.5)
WBC: 7.8 10*3/uL (ref 4.0–10.5)

## 2016-09-15 LAB — GLUCOSE, CAPILLARY: Glucose-Capillary: 325 mg/dL — ABNORMAL HIGH (ref 65–99)

## 2016-09-15 LAB — BASIC METABOLIC PANEL
Anion gap: 7 (ref 5–15)
BUN: 28 mg/dL — ABNORMAL HIGH (ref 6–20)
CO2: 25 mmol/L (ref 22–32)
Calcium: 10.5 mg/dL — ABNORMAL HIGH (ref 8.9–10.3)
Chloride: 105 mmol/L (ref 101–111)
Creatinine, Ser: 1.17 mg/dL (ref 0.61–1.24)
GFR calc Af Amer: >60 mL/min (ref >60)
GFR calc non Af Amer: >60 mL/min (ref >60)
Glucose, Bld: 314 mg/dL — ABNORMAL HIGH (ref 65–99)
Potassium: 5 mmol/L (ref 3.5–5.1)
Sodium: 137 mmol/L (ref 135–145)

## 2016-09-15 LAB — SURGICAL PCR SCREEN
MRSA, PCR: POSITIVE — AB
Staphylococcus aureus: POSITIVE — AB

## 2016-09-15 NOTE — Progress Notes (Signed)
BMP done 09/15/16- routed via epic to Dr Harlow Asa.

## 2016-09-15 NOTE — Progress Notes (Signed)
05/24/16-LOV-pcp-epic  04/18/16-CT chest-epic

## 2016-09-16 LAB — HEMOGLOBIN A1C
Hgb A1c MFr Bld: 10.2 % — ABNORMAL HIGH (ref 4.8–5.6)
Mean Plasma Glucose: 246 mg/dL

## 2016-09-18 NOTE — Progress Notes (Signed)
HGBA1C done 09/15/16-faxeed via epic to Dr Harlow Asa.

## 2016-09-18 NOTE — Progress Notes (Signed)
Final EKG done 09/15/16-epic

## 2016-09-20 MED ORDER — DEXTROSE 5 % IV SOLN
3.0000 g | INTRAVENOUS | Status: AC
  Administered 2016-09-21: 3 g via INTRAVENOUS
  Filled 2016-09-20 (×2): qty 3000

## 2016-09-21 ENCOUNTER — Encounter (HOSPITAL_COMMUNITY): Payer: Self-pay | Admitting: Anesthesiology

## 2016-09-21 ENCOUNTER — Ambulatory Visit (HOSPITAL_COMMUNITY): Payer: PPO | Admitting: Anesthesiology

## 2016-09-21 ENCOUNTER — Encounter (HOSPITAL_COMMUNITY)
Admission: RE | Disposition: A | Discharge status: Home / Self Care | Payer: Self-pay | Source: Ambulatory Visit | Attending: Surgery

## 2016-09-21 ENCOUNTER — Ambulatory Visit (HOSPITAL_COMMUNITY)
Admission: RE | Admit: 2016-09-21 | Discharge: 2016-09-21 | Disposition: A | Discharge status: Home / Self Care | Payer: PPO | Source: Ambulatory Visit | Attending: Surgery | Admitting: Surgery

## 2016-09-21 DIAGNOSIS — M795 Residual foreign body in soft tissue: Secondary | ICD-10-CM | POA: Insufficient documentation

## 2016-09-21 DIAGNOSIS — D6851 Activated protein C resistance: Secondary | ICD-10-CM | POA: Insufficient documentation

## 2016-09-21 DIAGNOSIS — K219 Gastro-esophageal reflux disease without esophagitis: Secondary | ICD-10-CM | POA: Diagnosis not present

## 2016-09-21 DIAGNOSIS — Z8507 Personal history of malignant neoplasm of pancreas: Secondary | ICD-10-CM | POA: Diagnosis not present

## 2016-09-21 DIAGNOSIS — L988 Other specified disorders of the skin and subcutaneous tissue: Secondary | ICD-10-CM | POA: Diagnosis not present

## 2016-09-21 DIAGNOSIS — Z7982 Long term (current) use of aspirin: Secondary | ICD-10-CM | POA: Diagnosis not present

## 2016-09-21 DIAGNOSIS — S30851A Superficial foreign body of abdominal wall, initial encounter: Secondary | ICD-10-CM | POA: Diagnosis present

## 2016-09-21 DIAGNOSIS — Z6836 Body mass index (BMI) 36.0-36.9, adult: Secondary | ICD-10-CM | POA: Insufficient documentation

## 2016-09-21 DIAGNOSIS — G4730 Sleep apnea, unspecified: Secondary | ICD-10-CM | POA: Insufficient documentation

## 2016-09-21 DIAGNOSIS — E119 Type 2 diabetes mellitus without complications: Secondary | ICD-10-CM | POA: Insufficient documentation

## 2016-09-21 DIAGNOSIS — Y838 Other surgical procedures as the cause of abnormal reaction of the patient, or of later complication, without mention of misadventure at the time of the procedure: Secondary | ICD-10-CM | POA: Insufficient documentation

## 2016-09-21 DIAGNOSIS — I10 Essential (primary) hypertension: Secondary | ICD-10-CM | POA: Insufficient documentation

## 2016-09-21 DIAGNOSIS — Z87891 Personal history of nicotine dependence: Secondary | ICD-10-CM | POA: Insufficient documentation

## 2016-09-21 DIAGNOSIS — T8189XA Other complications of procedures, not elsewhere classified, initial encounter: Secondary | ICD-10-CM | POA: Insufficient documentation

## 2016-09-21 DIAGNOSIS — Z794 Long term (current) use of insulin: Secondary | ICD-10-CM | POA: Insufficient documentation

## 2016-09-21 DIAGNOSIS — M6028 Foreign body granuloma of soft tissue, not elsewhere classified, other site: Secondary | ICD-10-CM | POA: Diagnosis not present

## 2016-09-21 DIAGNOSIS — Z79899 Other long term (current) drug therapy: Secondary | ICD-10-CM | POA: Diagnosis not present

## 2016-09-21 HISTORY — PX: WOUND EXPLORATION: SHX6188

## 2016-09-21 LAB — GLUCOSE, CAPILLARY
Glucose-Capillary: 199 mg/dL — ABNORMAL HIGH (ref 65–99)
Glucose-Capillary: 220 mg/dL — ABNORMAL HIGH (ref 65–99)

## 2016-09-21 SURGERY — WOUND EXPLORATION
Anesthesia: General | Site: Abdomen

## 2016-09-21 MED ORDER — LIDOCAINE 2% (20 MG/ML) 5 ML SYRINGE
INTRAMUSCULAR | Status: AC
  Filled 2016-09-21: qty 5

## 2016-09-21 MED ORDER — ONDANSETRON HCL 4 MG/2ML IJ SOLN
INTRAMUSCULAR | Status: AC
  Filled 2016-09-21: qty 2

## 2016-09-21 MED ORDER — DEXAMETHASONE SODIUM PHOSPHATE 10 MG/ML IJ SOLN
INTRAMUSCULAR | Status: DC | PRN
  Administered 2016-09-21: 4 mg via INTRAVENOUS

## 2016-09-21 MED ORDER — CHLORHEXIDINE GLUCONATE CLOTH 2 % EX PADS: 6.0000 | MEDICATED_PAD | Freq: Once | CUTANEOUS | Status: DC

## 2016-09-21 MED ORDER — FENTANYL CITRATE (PF) 100 MCG/2ML IJ SOLN: 25.0000 ug | INTRAMUSCULAR | Status: DC | PRN

## 2016-09-21 MED ORDER — PROPOFOL 10 MG/ML IV BOLUS
INTRAVENOUS | Status: DC | PRN
  Administered 2016-09-21: 150 mg via INTRAVENOUS

## 2016-09-21 MED ORDER — BUPIVACAINE HCL (PF) 0.5 % IJ SOLN
INTRAMUSCULAR | Status: DC | PRN
  Administered 2016-09-21: 20 mL

## 2016-09-21 MED ORDER — ROCURONIUM BROMIDE 50 MG/5ML IV SOSY
PREFILLED_SYRINGE | INTRAVENOUS | Status: AC
  Filled 2016-09-21: qty 5

## 2016-09-21 MED ORDER — SUCCINYLCHOLINE CHLORIDE 200 MG/10ML IV SOSY
PREFILLED_SYRINGE | INTRAVENOUS | Status: DC | PRN
  Administered 2016-09-21: 140 mg via INTRAVENOUS

## 2016-09-21 MED ORDER — PROPOFOL 10 MG/ML IV BOLUS
INTRAVENOUS | Status: AC
  Filled 2016-09-21: qty 40

## 2016-09-21 MED ORDER — DEXAMETHASONE SODIUM PHOSPHATE 10 MG/ML IJ SOLN
INTRAMUSCULAR | Status: AC
  Filled 2016-09-21: qty 1

## 2016-09-21 MED ORDER — SUCCINYLCHOLINE CHLORIDE 200 MG/10ML IV SOSY
PREFILLED_SYRINGE | INTRAVENOUS | Status: AC
  Filled 2016-09-21: qty 10

## 2016-09-21 MED ORDER — FENTANYL CITRATE (PF) 100 MCG/2ML IJ SOLN
INTRAMUSCULAR | Status: AC
  Filled 2016-09-21: qty 2

## 2016-09-21 MED ORDER — ONDANSETRON HCL 4 MG/2ML IJ SOLN
INTRAMUSCULAR | Status: DC | PRN
  Administered 2016-09-21: 4 mg via INTRAVENOUS

## 2016-09-21 MED ORDER — DEXAMETHASONE SODIUM PHOSPHATE 10 MG/ML IJ SOLN
INTRAMUSCULAR | Status: AC
  Filled 2016-09-21: qty 10

## 2016-09-21 MED ORDER — LACTATED RINGERS IV SOLN
INTRAVENOUS | Status: DC
  Administered 2016-09-21 (×2): via INTRAVENOUS

## 2016-09-21 MED ORDER — EPHEDRINE SULFATE-NACL 50-0.9 MG/10ML-% IV SOSY
PREFILLED_SYRINGE | INTRAVENOUS | Status: DC | PRN
  Administered 2016-09-21: 10 mg via INTRAVENOUS
  Administered 2016-09-21 (×2): 5 mg via INTRAVENOUS

## 2016-09-21 MED ORDER — FENTANYL CITRATE (PF) 100 MCG/2ML IJ SOLN
INTRAMUSCULAR | Status: DC | PRN
  Administered 2016-09-21: 100 ug via INTRAVENOUS

## 2016-09-21 MED ORDER — SUCCINYLCHOLINE CHLORIDE 200 MG/10ML IV SOSY
PREFILLED_SYRINGE | INTRAVENOUS | Status: AC
Start: 2016-09-21 — End: 2016-09-21
  Filled 2016-09-21: qty 10

## 2016-09-21 MED ORDER — HYDROCODONE-ACETAMINOPHEN 7.5-325 MG PO TABS: 1.0000 | ORAL_TABLET | Freq: Once | ORAL | Status: DC | PRN

## 2016-09-21 MED ORDER — LIDOCAINE 2% (20 MG/ML) 5 ML SYRINGE
INTRAMUSCULAR | Status: DC | PRN
  Administered 2016-09-21: 100 mg via INTRAVENOUS

## 2016-09-21 MED ORDER — 0.9 % SODIUM CHLORIDE (POUR BTL) OPTIME
TOPICAL | Status: DC | PRN
  Administered 2016-09-21: 1000 mL

## 2016-09-21 MED ORDER — BUPIVACAINE HCL (PF) 0.5 % IJ SOLN
INTRAMUSCULAR | Status: AC
  Filled 2016-09-21: qty 30

## 2016-09-21 MED FILL — NovoLOG 100 UNIT/ML SOLN: 100 | 42 days supply | Qty: 20 | Fill #2

## 2016-09-21 MED FILL — LOSARTAN POTASSIUM 50 MG TA: 50 | 90 days supply | Qty: 90 | Fill #1

## 2016-09-21 SURGICAL SUPPLY — 34 items
BLADE HEX COATED 2.75 (ELECTRODE) ×2 IMPLANT
BLADE SURG SZ10 CARB STEEL (BLADE) ×2 IMPLANT
CHLORAPREP W/TINT 26ML (MISCELLANEOUS) ×2 IMPLANT
DECANTER SPIKE VIAL GLASS SM (MISCELLANEOUS) ×1 IMPLANT
DRAIN CHANNEL RND F F (WOUND CARE) IMPLANT
DRAPE LAPAROTOMY T 102X78X121 (DRAPES) IMPLANT
DRAPE LAPAROTOMY TRNSV 102X78 (DRAPE) ×1 IMPLANT
DRAPE SHEET LG 3/4 BI-LAMINATE (DRAPES) IMPLANT
ELECT REM PT RETURN 15FT ADLT (MISCELLANEOUS) ×2 IMPLANT
EVACUATOR SILICONE 100CC (DRAIN) IMPLANT
GAUZE SPONGE 4X4 12PLY STRL (GAUZE/BANDAGES/DRESSINGS) ×2 IMPLANT
GLOVE BIOGEL PI IND STRL 7.0 (GLOVE) ×1 IMPLANT
GLOVE BIOGEL PI INDICATOR 7.0 (GLOVE) ×1
GLOVE SURG ORTHO 8.0 STRL STRW (GLOVE) ×2 IMPLANT
GOWN STRL REUS W/TWL LRG LVL3 (GOWN DISPOSABLE) ×2 IMPLANT
GOWN STRL REUS W/TWL XL LVL3 (GOWN DISPOSABLE) ×4 IMPLANT
KIT BASIN OR (CUSTOM PROCEDURE TRAY) ×2 IMPLANT
MARKER SKIN DUAL TIP RULER LAB (MISCELLANEOUS) IMPLANT
NDL HYPO 25X1 1.5 SAFETY (NEEDLE) ×1 IMPLANT
NEEDLE HYPO 25X1 1.5 SAFETY (NEEDLE) IMPLANT
NS IRRIG 1000ML POUR BTL (IV SOLUTION) ×2 IMPLANT
PACK GENERAL/GYN (CUSTOM PROCEDURE TRAY) ×2 IMPLANT
SOL PREP PROV IODINE SCRUB 4OZ (MISCELLANEOUS) ×1 IMPLANT
SPONGE LAP 18X18 X RAY DECT (DISPOSABLE) IMPLANT
STAPLER VISISTAT 35W (STAPLE) IMPLANT
STRIP CLOSURE SKIN 1/2X4 (GAUZE/BANDAGES/DRESSINGS) IMPLANT
SUT ETHILON 3 0 PS 1 (SUTURE) IMPLANT
SUT MNCRL AB 4-0 PS2 18 (SUTURE) IMPLANT
SUT PDS AB 1 CT1 27 (SUTURE) ×1 IMPLANT
SUT VIC AB 3-0 SH 18 (SUTURE) IMPLANT
SYR CONTROL 10ML LL (SYRINGE) ×2 IMPLANT
TAPE CLOTH SURG 4X10 WHT LF (GAUZE/BANDAGES/DRESSINGS) ×1 IMPLANT
TOWEL OR 17X26 10 PK STRL BLUE (TOWEL DISPOSABLE) ×2 IMPLANT
TOWEL OR NON WOVEN STRL DISP B (DISPOSABLE) ×2 IMPLANT

## 2016-09-21 NOTE — Anesthesia Preprocedure Evaluation (Addendum)
Anesthesia Evaluation  Patient identified by MRN, date of birth, ID band Patient awake    Reviewed: Allergy & Precautions, NPO status , Patient's Chart, lab work & pertinent test results, reviewed documented beta blocker date and time   History of Anesthesia Complications Negative for: history of anesthetic complications  Airway Mallampati: II  TM Distance: >3 FB Neck ROM: Full    Dental  (+) Partial Lower, Dental Advisory Given   Pulmonary neg shortness of breath, sleep apnea , neg COPD, neg recent URI, former smoker,  Welder's lung   breath sounds clear to auscultation       Cardiovascular hypertension, Pt. on medications and Pt. on home beta blockers (-) angina+ DOE  (-) Past MI, (-) Cardiac Stents and (-) Orthopnea + dysrhythmias (incomplete RBBB)  Rhythm:Regular Rate:Normal     Neuro/Psych neg Seizures PSYCHIATRIC DISORDERS Anxiety Depression Lumbar DDD, Meniere's disease    GI/Hepatic Neg liver ROS, GERD  Medicated and Poorly Controlled,  Endo/Other  diabetes, Poorly Controlled, Type 2, Insulin DependentMorbid obesity  Renal/GU Renal InsufficiencyRenal disease (h/o nephrolithiasis)     Musculoskeletal  (+) Arthritis ,   Abdominal (+) + obese,   Peds  Hematology  (+) Blood dyscrasia (Factor V Leiden, no h/o blood clots), ,   Anesthesia Other Findings Pancreatic cancer, hearing loss right ear  Reproductive/Obstetrics                            Anesthesia Physical Anesthesia Plan  ASA: III  Anesthesia Plan: General   Post-op Pain Management:    Induction: Intravenous  PONV Risk Score and Plan: 2 and Ondansetron and Dexamethasone  Airway Management Planned: LMA and Oral ETT  Additional Equipment: None  Intra-op Plan:   Post-operative Plan: Extubation in OR  Informed Consent: I have reviewed the patients History and Physical, chart, labs and discussed the procedure including  the risks, benefits and alternatives for the proposed anesthesia with the patient or authorized representative who has indicated his/her understanding and acceptance.   Dental advisory given  Plan Discussed with: CRNA and Surgeon  Anesthesia Plan Comments:         Anesthesia Quick Evaluation

## 2016-09-21 NOTE — Discharge Instructions (Signed)
General Anesthesia, Adult, Care After °These instructions provide you with information about caring for yourself after your procedure. Your health care provider may also give you more specific instructions. Your treatment has been planned according to current medical practices, but problems sometimes occur. Call your health care provider if you have any problems or questions after your procedure. °What can I expect after the procedure? °After the procedure, it is common to have: °· Vomiting. °· A sore throat. °· Mental slowness. ° °It is common to feel: °· Nauseous. °· Cold or shivery. °· Sleepy. °· Tired. °· Sore or achy, even in parts of your body where you did not have surgery. ° °Follow these instructions at home: °For at least 24 hours after the procedure: °· Do not: °? Participate in activities where you could fall or become injured. °? Drive. °? Use heavy machinery. °? Drink alcohol. °? Take sleeping pills or medicines that cause drowsiness. °? Make important decisions or sign legal documents. °? Take care of children on your own. °· Rest. °Eating and drinking °· If you vomit, drink water, juice, or soup when you can drink without vomiting. °· Drink enough fluid to keep your urine clear or pale yellow. °· Make sure you have little or no nausea before eating solid foods. °· Follow the diet recommended by your health care provider. °General instructions °· Have a responsible adult stay with you until you are awake and alert. °· Return to your normal activities as told by your health care provider. Ask your health care provider what activities are safe for you. °· Take over-the-counter and prescription medicines only as told by your health care provider. °· If you smoke, do not smoke without supervision. °· Keep all follow-up visits as told by your health care provider. This is important. °Contact a health care provider if: °· You continue to have nausea or vomiting at home, and medicines are not helpful. °· You  cannot drink fluids or start eating again. °· You cannot urinate after 8-12 hours. °· You develop a skin rash. °· You have fever. °· You have increasing redness at the site of your procedure. °Get help right away if: °· You have difficulty breathing. °· You have chest pain. °· You have unexpected bleeding. °· You feel that you are having a life-threatening or urgent problem. °This information is not intended to replace advice given to you by your health care provider. Make sure you discuss any questions you have with your health care provider. °Document Released: 05/15/2000 Document Revised: 07/12/2015 Document Reviewed: 01/21/2015 °Elsevier Interactive Patient Education © 2018 [employer] ° °

## 2016-09-21 NOTE — Brief Op Note (Signed)
09/21/2016  9:15 AM  PATIENT:  Derek [name]  71 y.o. male  PRE-OPERATIVE DIAGNOSIS:  chronic wound with foreign body  POST-OPERATIVE DIAGNOSIS:  chronic wound with foreign body  PROCEDURE:  Procedure(s): WOUND EXPLORATION AND DEBRIDEMENT ABDOMINAL WALL (N/A)  SURGEON:  Surgeon(s) and Role:    * Armandina Gemma, MD - Primary  ANESTHESIA:   general  EBL:  Total I/O In: 750 [I.V.:750] Out: -   BLOOD ADMINISTERED:none  DRAINS: none   LOCAL MEDICATIONS USED:  MARCAINE     SPECIMEN:  No Specimen  DISPOSITION OF SPECIMEN:  N/A  COUNTS:  YES  TOURNIQUET:  * No tourniquets in log *  DICTATION: .Other Dictation: Dictation Number [id number]  PLAN OF CARE: Discharge to home after PACU  PATIENT DISPOSITION:  PACU - hemodynamically stable.   Delay start of Pharmacological VTE agent (>24hrs) due to surgical blood loss or risk of bleeding: yes  [name], MD, [facility] Office: [phone]

## 2016-09-21 NOTE — Anesthesia Procedure Notes (Signed)
Procedure Name: Intubation Date/Time: 09/21/2016 8:39 AM Performed by: Richar Dunklee, Virgel Gess Pre-anesthesia Checklist: Patient identified, Emergency Drugs available, Suction available, Patient being monitored and Timeout performed Patient Re-evaluated:Patient Re-evaluated prior to induction Oxygen Delivery Method: Circle system utilized Preoxygenation: Pre-oxygenation with 100% oxygen Induction Type: IV induction Ventilation: Mask ventilation without difficulty Laryngoscope Size: Mac and 4 Grade View: Grade II Tube type: Oral Tube size: 7.5 mm Number of attempts: 1 Airway Equipment and Method: Stylet Placement Confirmation: ETT inserted through vocal cords under direct vision,  positive ETCO2,  CO2 detector and breath sounds checked- equal and bilateral Secured at: 22 cm Tube secured with: Tape Dental Injury: Teeth and Oropharynx as per pre-operative assessment

## 2016-09-21 NOTE — Interval H&P Note (Signed)
History and Physical Interval Note:  09/21/2016 8:09 AM  Derek [name]  has presented today for surgery, with the diagnosis of chronic wound with foreign body  The various methods of treatment have been discussed with the patient and family. After consideration of risks, benefits and other options for treatment, the patient has consented to    Procedure(s): WOUND EXPLORATION AND DEBRIDEMENT ABDOMINAL WALL (N/A) as a surgical intervention .    The patient's history has been reviewed, patient examined, no change in status, stable for surgery.  I have reviewed the patient's chart and labs.  Questions were answered to the patient's satisfaction.    [name], MD, [facility] Office: [place]

## 2016-09-21 NOTE — Transfer of Care (Signed)
Immediate Anesthesia Transfer of Care Note  Patient: Derek [name]  Procedure(s) Performed: Procedure(s): WOUND EXPLORATION AND DEBRIDEMENT ABDOMINAL WALL (N/A)  Patient Location: PACU  Anesthesia Type:General  Level of Consciousness:  sedated, patient cooperative and responds to stimulation  Airway & Oxygen Therapy:Patient Spontanous Breathing and Patient connected to face mask oxgen  Post-op Assessment:  Report given to PACU RN and Post -op Vital signs reviewed and stable  Post vital signs:  Reviewed and stable  Last Vitals:  Vitals:   09/21/16 0625 09/21/16 0920  BP: (!) 148/77 104/72  Pulse: 64 66  Resp: 18 16  Temp: 36.8 C (!) 09.7 C    Complications: No apparent anesthesia complications

## 2016-09-21 NOTE — Anesthesia Postprocedure Evaluation (Signed)
Anesthesia Post Note  Patient: Derek [name]  Procedure(s) Performed: Procedure(s) (LRB): WOUND EXPLORATION AND DEBRIDEMENT ABDOMINAL WALL (N/A)     Patient location during evaluation: PACU Anesthesia Type: General Level of consciousness: awake and alert Pain management: pain level controlled Vital Signs Assessment: post-procedure vital signs reviewed and stable Respiratory status: spontaneous breathing, nonlabored ventilation, respiratory function stable and patient connected to nasal cannula oxygen Cardiovascular status: blood pressure returned to baseline and stable Postop Assessment: no signs of nausea or vomiting Anesthetic complications: no    Last Vitals:  Vitals:   09/21/16 1014 09/21/16 1041  BP: (!) 157/91 (!) 150/86  Pulse: (!) 59 (!) 58  Resp: 16   Temp: 36.5 C     Last Pain:  Vitals:   09/21/16 1041  TempSrc:   PainSc: 0-No pain                 Jezlyn Westerfield

## 2016-09-22 NOTE — Op Note (Signed)
NAMEVEDANSH, KERSTETTER NO.:  [id number]  MEDICAL RECORD NO.:  [id number]  LOCATION:                                 FACILITY:  PHYSICIAN:  [name], MD      DATE OF BIRTH:  03-26-1945  DATE OF PROCEDURE:  09/21/2016                              OPERATIVE REPORT   PREOPERATIVE DIAGNOSIS:  Chronic wound with foreign body.  POSTOPERATIVE DIAGNOSIS:  Chronic wound with foreign body.  PROCEDURE:  Wound exploration with excision of foreign body (suture and mesh).  SURGEON:  [name], MD.  ANESTHESIA:  General.  ESTIMATED BLOOD LOSS:  Minimal.  PREPARATION:  ChloraPrep.  COMPLICATIONS:  None.  INDICATIONS:  The patient is a 71 year old male with a complex past surgical history.  The patient has bilateral subcostal surgical incision with 2 small areas, which continued to have intermittent drainage consistent with fistulae.  The patient now comes to Surgery for wound exploration.  BODY OF REPORT:  Procedure was done in OR #4 at the [facility].  The patient was brought to the operating room, placed in supine position on the operating room table.  Following administration of general anesthesia, the patient was prepped and draped in the usual aseptic fashion.  After ascertaining that an adequate level of anesthesia had been achieved, the 2 small openings in the incision at the medial aspect of the left costal margin are probed with a blunt-tip probe.  Tract goes approximately 2 cm down to the fascia.  Using the electrocautery, an overlying ellipse of skin was excised so as to remove both of the fistulous openings.  The tracks were then followed using the blunt-tip probe down to the level of the fascia.  At this point, suture material and unincorporated mesh was identified.  A total of 4 permanent sutures were removed.  A small amount of mesh was removed.  All granulation tissue was removed.  Wound was irrigated with  Betadine solution.  Fascial defects were closed with interrupted #1 PDS sutures. Subcutaneous tissues were anesthetized with local anesthetic.  Skin was anesthetized with local anesthetic.  A 4 x 4 gauze sponge saturated with Betadine solution was packed into the wound and covered with dry gauze dressings.  The patient was awakened from anesthesia and brought to the recovery room.  The patient tolerated the procedure well.   [name], MD, [facility] Office: [phone]    TMG/MEDQ  D:  09/21/2016  T:  09/21/2016  Job:  [id number]

## 2016-09-27 DIAGNOSIS — E119 Type 2 diabetes mellitus without complications: Secondary | ICD-10-CM | POA: Diagnosis not present

## 2016-09-27 DIAGNOSIS — I10 Essential (primary) hypertension: Secondary | ICD-10-CM | POA: Diagnosis not present

## 2016-09-27 DIAGNOSIS — R809 Proteinuria, unspecified: Secondary | ICD-10-CM | POA: Diagnosis not present

## 2016-09-28 MED FILL — TAMSULOSIN HCL 0.4 MG CAP: 0.4 | 30 days supply | Qty: 30 | Fill #1

## 2016-09-28 MED FILL — ONETOUCH VERIO TEST STRIP: 34 days supply | Qty: 100 | Fill #1

## 2016-09-29 ENCOUNTER — Encounter (HOSPITAL_BASED_OUTPATIENT_CLINIC_OR_DEPARTMENT_OTHER): Payer: Self-pay | Admitting: *Deleted

## 2016-09-29 NOTE — Progress Notes (Addendum)
SPOKE W/ PT WIFE.  NPO AFTER MN.  ARRIVE AT 0945.  CURRENT LAB RESULTS AND EKG IN CHART AND EPIC.  WILL TAKE PREVACID AM DOS W/ SIPS OF WATER.  WILL REVIEW CHART W/ MDA AND OBTAIN  ADVICE FOR INSULIN DOSE HS BEFORE AND AM DOS. THEN NEED TO CALL PT BACK W/ INSTRUCTIONS.  ADDENDUM:  REVIEWED CHART W/ DR Alycia Patten MDA,  OK TO PROCEED AND HAVE PT DO REGULAR DOSE OF NPH INSULIN HS BEFORE AND AM DOS AND REMIND PT IF CBG AM DOS 300 AND ABOVE CASE WOULD BE CANCELLED.  CALLED PT AND SPOKE W/ PT WIFE,  SHE VERBALIZED UNDERSTANDING TO HAVE PT DO REGULAR NPH INSULIN DOSE HS BEFORE AND AM DOS AND FOR PT TO WATCH DIET CLOSELY SO HIS BLOOD SUGAR WOULD HOPEFULLY NOT BE 300.

## 2016-10-06 DIAGNOSIS — R300 Dysuria: Secondary | ICD-10-CM | POA: Diagnosis not present

## 2016-10-11 NOTE — H&P (Signed)
H&P  Chief Complaint: Prostate enargement  History of Present Illness: 71 year old male with BPH and significant urinary symptoms presents for urolift procedure for mgmt.  He is on maximal medical therapy including finasteride and alfuzosin. Despite this, he still has significant obstructive symptomatology.   At his last visit on 07/18/2016, IPSS was 23, quality of life score 5.   Cystoscopy revealed bilateral lobar hypertrophy with a small median bar. There was significant trabeculation. There was also a small right-sided bladder diverticulum.    Past Medical History:  Diagnosis Date  . Anxiety   . Arthritis   . Bilateral leg cramps    takes magnesium  . BPH (benign prostatic hyperplasia)   . DDD (degenerative disc disease), lumbar    gets back injections   . Deafness in right ear    meniere's disease  . Depression   . Dermatitis    bilateral hands  . Dyspnea on exertion   . Factor V Leiden mutation ([place])    "never had blood clots"  . GERD (gastroesophageal reflux disease)   . Headache   . Hiatal hernia   . History of kidney stones    multiple kidney stones-not a problem at present  . 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 History of panic attacks   . 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 Hypertension   . Incomplete emptying of bladder   . Incomplete right bundle branch block   . Insulin dependent type 2 diabetes mellitus, uncontrolled ([place]) followed by dr [name] ([facility])   dx 2007--- last A1c 10.2 on 09-15-2016  per pt wife, am bs's 115 to 265 ( past 10 days)  . Lower urinary tract symptoms (LUTS)   . Meniere's disease dx 1970s   intermittant vertigo  . Obstructive sleep apnea    " i used to have sleep apnea" never used a c-pap   . Open wound of abdomen    WET/DRY  DRESSING CHANGES TID--- POST ABD. SURGERY 09-21-2016  . Peripheral vascular disease (HCC)    right leg - decreased pulse   . Wears glasses   . Wears partial dentures    LOWER  . 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 ANAL FISTULECTOMY N/A 04/01/2013   Procedure: EXAM UNDER ANESTHESIA WITH ANAL FISSURectomy, sphincterotomy and internal hemorrhoidectomy;  Surgeon: Harl Bowie, MD;  Location: [facility];  Service: General;  Laterality: N/A;  . APPLICATION OF WOUND VAC N/A 05/16/2015   Procedure: APPLICATION OF WOUND VAC;  Surgeon: Armandina Gemma, MD;  Location: [facility];  Service: General;  Laterality: N/A;  . APPLICATION OF WOUND VAC N/A 05/20/2015   Procedure: EXHANGE OF ABDOMINAL  WOUND VAC DRESSING;  Surgeon: Armandina Gemma, MD;  Location: [facility];  Service: General;  Laterality: N/A;  . COLONOSCOPY    . EUS N/A 12/31/2014   Procedure: UPPER ENDOSCOPIC ULTRASOUND (EUS) LINEAR;  Surgeon: Milus Banister, MD;  [facility];  Service: Endoscopy;  Laterality: N/A;  . HARDWARE REMOVAL Left 2013   left ankle  . HEMORRHOIDECTOMY WITH HEMORRHOID BANDING    . INCISION AND DRAINAGE ABSCESS N/A 05/14/2015   Procedure: DRAINAGE OF INFECTED PANCREATIC PSEUDOCYST;  Surgeon: Armandina Gemma,  MD;  [facility];  Service: General;  Laterality: N/A;  . INCISION AND DRAINAGE OF WOUND N/A 05/16/2015   Procedure: IRRIGATION AND DEBRIDEMENT WOUND;  Surgeon: Armandina Gemma, MD;  [facility];  Service: General;  Laterality: N/A;  . INCISIONAL HERNIA REPAIR N/A 11/12/2015   Procedure: [place];  Surgeon: Armandina Gemma, MD;  Location: [place];  Service: General;  Laterality: N/A;  . INGUINAL HERNIA REPAIR Bilateral 1974;  1982  . INSERTION OF MESH N/A 11/12/2015   Procedure: INSERTION OF MESH;  Surgeon: Armandina Gemma, MD;  Location: [place];  Service: General;  Laterality: N/A;  . IR GENERIC HISTORICAL  04/20/2015   IR  RADIOLOGIST EVAL & MGMT 04/20/2015 [facility] INTERV RAD  . KNEE ARTHROSCOPY Bilateral right 08-08-2000;  left 11-23-2000   meniscal repair and chondroplasty's  . KNEE ARTHROSCOPY  03/01/2012   Procedure: ARTHROSCOPY KNEE;  Surgeon: Gearlean Alf, MD;  [facility];  Service: Orthopedics;  Laterality: Left;  WITH SYNOVECTOMY   . LAPAROTOMY N/A 05/14/2015   Procedure: EXPLORATORY LAPAROTOMY;  Surgeon: Armandina Gemma, MD;  [facility];  Service: General;  Laterality: N/A;  . ORIF LEFT ANKLE FX  08/20/2009   distal tib-fib and malleolus  . ROTATOR CUFF REPAIR Bilateral right 1993; left 1994  . TOTAL KNEE ARTHROPLASTY Right 06/02/2013   Procedure: RIGHT TOTAL KNEE ARTHROPLASTY;  Surgeon: Gearlean Alf, MD;  [facility];  Service: Orthopedics;  Laterality: Right;  . TOTAL KNEE ARTHROPLASTY Left 01-31-2008   dr Wynelle Link  . UMBILICAL HERNIA REPAIR  08-11-1999   dr Ninfa Linden  . UPPER GASTROINTESTINAL ENDOSCOPY  sept 2016  . WOUND EXPLORATION N/A 09/21/2016   Procedure: WOUND EXPLORATION AND DEBRIDEMENT ABDOMINAL WALL;  Surgeon: Armandina Gemma, MD;  [facility];  Service: General;  Laterality: N/A;    Home Medications:  Allergies as of 10/11/2016      Reactions   Oxycontin [oxycodone Hcl] Swelling, Other (See Comments)   Lips swells. Tolerates immediate-release oxycodone as well as hydrocodone.   Quinine Other (See Comments)   Platelets dropped   Voltaren [diclofenac Sodium] Other (See Comments)   Elevated liver enzymes   Morphine And Related Nausea And Vomiting      Medication List    Notice   Cannot display discharge medications because the patient has not yet been admitted.     Allergies:  Allergies  Allergen Reactions  . Oxycontin [Oxycodone Hcl] Swelling and Other (See Comments)    Lips swells. Tolerates immediate-release oxycodone as well as hydrocodone.  . Quinine Other (See Comments)    Platelets dropped  . Voltaren [Diclofenac Sodium] Other (See  Comments)    Elevated liver enzymes  . Morphine And Related Nausea And Vomiting    Family History  Problem Relation Age of Onset  . Bone cancer Father        Jaw  . Factor V Leiden deficiency Daughter   . Diabetes Maternal Grandmother   . Heart disease Paternal Uncle   . Colon cancer Neg Hx   . Esophageal cancer Neg Hx   . Stomach cancer Neg Hx   . Rectal cancer Neg Hx     Social History:  reports that he quit smoking about 41 years ago. His smoking use included Cigarettes. He has a 68.00 pack-year smoking history. He has never used smokeless tobacco. He reports that he does not drink alcohol or use drugs.  ROS: A complete review of systems was performed.  All systems are negative except for pertinent findings as noted.  Physical Exam:  Vital signs in last 24 hours:   Constitutional:  Alert and oriented, No acute distress Cardiovascular: Regular rate and rhythm, No JVD Respiratory: Normal respiratory effort, Lungs clear bilaterally GI: Abdomen is soft, nontender, nondistended, no abdominal masses Genitourinary: No CVAT. Normal male phallus, testes are descended bilaterally and non-tender and without masses, scrotum is normal in appearance without lesions or masses, perineum is normal on inspection. Rectal: Normal sphincter tone, no rectal masses, prostate is non tender and without nodularity. Prostate size is estimated to be 30 cc Lymphatic: No lymphadenopathy Neurologic: Grossly intact, no focal deficits Psychiatric: Normal mood and affect  Laboratory Data:  No results for input(s): WBC, HGB, HCT, PLT in the last 72 hours.  No results for input(s): NA, K, CL, GLUCOSE, BUN, CALCIUM, CREATININE in the last 72 hours.  Invalid input(s): CO3   No results found for this or any previous visit (from the past 24 hour(s)). No results found for this or any previous visit (from the past 240 hour(s)).  Renal Function: No results for input(s): CREATININE in the last 168  hours. CrCl cannot be calculated (Patient's most recent lab result is older than the maximum 21 days allowed.).  Radiologic Imaging: No results found.  Impression/Assessment:  BPH with obstuction  Plan:  Urolift procedure

## 2016-10-12 ENCOUNTER — Encounter (HOSPITAL_BASED_OUTPATIENT_CLINIC_OR_DEPARTMENT_OTHER)
Admission: RE | Disposition: A | Discharge status: Home / Self Care | Payer: Self-pay | Source: Ambulatory Visit | Attending: Urology

## 2016-10-12 ENCOUNTER — Ambulatory Visit (HOSPITAL_BASED_OUTPATIENT_CLINIC_OR_DEPARTMENT_OTHER): Payer: PPO | Admitting: Anesthesiology

## 2016-10-12 ENCOUNTER — Ambulatory Visit (HOSPITAL_BASED_OUTPATIENT_CLINIC_OR_DEPARTMENT_OTHER)
Admission: RE | Admit: 2016-10-12 | Discharge: 2016-10-12 | Disposition: A | Discharge status: Home / Self Care | Payer: PPO | Source: Ambulatory Visit | Attending: Urology | Admitting: Urology

## 2016-10-12 ENCOUNTER — Encounter (HOSPITAL_BASED_OUTPATIENT_CLINIC_OR_DEPARTMENT_OTHER): Payer: Self-pay

## 2016-10-12 DIAGNOSIS — E119 Type 2 diabetes mellitus without complications: Secondary | ICD-10-CM | POA: Insufficient documentation

## 2016-10-12 DIAGNOSIS — N400 Enlarged prostate without lower urinary tract symptoms: Secondary | ICD-10-CM | POA: Diagnosis not present

## 2016-10-12 DIAGNOSIS — N323 Diverticulum of bladder: Secondary | ICD-10-CM | POA: Diagnosis not present

## 2016-10-12 DIAGNOSIS — Z794 Long term (current) use of insulin: Secondary | ICD-10-CM | POA: Diagnosis not present

## 2016-10-12 DIAGNOSIS — Z87891 Personal history of nicotine dependence: Secondary | ICD-10-CM | POA: Insufficient documentation

## 2016-10-12 DIAGNOSIS — Z79899 Other long term (current) drug therapy: Secondary | ICD-10-CM | POA: Diagnosis not present

## 2016-10-12 DIAGNOSIS — I4510 Unspecified right bundle-branch block: Secondary | ICD-10-CM | POA: Diagnosis not present

## 2016-10-12 DIAGNOSIS — Z8507 Personal history of malignant neoplasm of pancreas: Secondary | ICD-10-CM | POA: Diagnosis not present

## 2016-10-12 DIAGNOSIS — D6851 Activated protein C resistance: Secondary | ICD-10-CM | POA: Insufficient documentation

## 2016-10-12 DIAGNOSIS — K602 Anal fissure, unspecified: Secondary | ICD-10-CM | POA: Diagnosis not present

## 2016-10-12 DIAGNOSIS — I10 Essential (primary) hypertension: Secondary | ICD-10-CM | POA: Diagnosis not present

## 2016-10-12 DIAGNOSIS — N401 Enlarged prostate with lower urinary tract symptoms: Secondary | ICD-10-CM | POA: Insufficient documentation

## 2016-10-12 DIAGNOSIS — Z6836 Body mass index (BMI) 36.0-36.9, adult: Secondary | ICD-10-CM | POA: Insufficient documentation

## 2016-10-12 DIAGNOSIS — R338 Other retention of urine: Secondary | ICD-10-CM | POA: Insufficient documentation

## 2016-10-12 DIAGNOSIS — K219 Gastro-esophageal reflux disease without esophagitis: Secondary | ICD-10-CM | POA: Diagnosis not present

## 2016-10-12 DIAGNOSIS — N138 Other obstructive and reflux uropathy: Secondary | ICD-10-CM | POA: Diagnosis not present

## 2016-10-12 HISTORY — DX: Personal history of other mental and behavioral disorders: Z86.59

## 2016-10-12 HISTORY — DX: Personal history of malignant neoplasm of pancreas: Z85.07

## 2016-10-12 HISTORY — DX: Diaphragmatic hernia without obstruction or gangrene: K44.9

## 2016-10-12 HISTORY — DX: Presence of dental prosthetic device (complete) (partial): Z97.2

## 2016-10-12 HISTORY — DX: Unspecified symptoms and signs involving the genitourinary system: R39.9

## 2016-10-12 HISTORY — DX: Activated protein C resistance: D68.51

## 2016-10-12 HISTORY — DX: Cramp and spasm: R25.2

## 2016-10-12 HISTORY — DX: Long term (current) use of insulin: Z79.4

## 2016-10-12 HISTORY — DX: Reserved for concepts with insufficient information to code with codable children: IMO0002

## 2016-10-12 HISTORY — DX: Personal history of other (healed) physical injury and trauma: Z87.828

## 2016-10-12 HISTORY — DX: Unspecified right bundle-branch block: I45.10

## 2016-10-12 HISTORY — DX: Unspecified hearing loss, right ear: H91.91

## 2016-10-12 HISTORY — DX: Unspecified open wound of abdominal wall, unspecified quadrant without penetration into peritoneal cavity, initial encounter: S31.109A

## 2016-10-12 HISTORY — DX: Type 2 diabetes mellitus with hyperglycemia: E11.65

## 2016-10-12 HISTORY — PX: CYSTOSCOPY WITH INSERTION OF UROLIFT: SHX6678

## 2016-10-12 LAB — GLUCOSE, CAPILLARY
Glucose-Capillary: 130 mg/dL — ABNORMAL HIGH (ref 65–99)
Glucose-Capillary: 126 mg/dL — ABNORMAL HIGH (ref 65–99)
Glucose-Capillary: 172 mg/dL — ABNORMAL HIGH (ref 65–99)

## 2016-10-12 SURGERY — CYSTOSCOPY WITH INSERTION OF UROLIFT
Anesthesia: General | Site: Prostate

## 2016-10-12 MED ORDER — FENTANYL CITRATE (PF) 100 MCG/2ML IJ SOLN
25.0000 ug | INTRAMUSCULAR | Status: DC | PRN
  Filled 2016-10-12: qty 1

## 2016-10-12 MED ORDER — LIDOCAINE 2% (20 MG/ML) 5 ML SYRINGE
INTRAMUSCULAR | Status: AC
  Filled 2016-10-12: qty 5

## 2016-10-12 MED ORDER — ACETAMINOPHEN 650 MG RE SUPP
650.0000 mg | RECTAL | Status: DC | PRN
  Filled 2016-10-12: qty 1

## 2016-10-12 MED ORDER — CEFAZOLIN SODIUM-DEXTROSE 2-4 GM/100ML-% IV SOLN
2.0000 g | Freq: Once | INTRAVENOUS | Status: AC
  Administered 2016-10-12: 2 g via INTRAVENOUS
  Filled 2016-10-12: qty 100

## 2016-10-12 MED ORDER — EPHEDRINE SULFATE-NACL 50-0.9 MG/10ML-% IV SOSY
PREFILLED_SYRINGE | INTRAVENOUS | Status: DC | PRN
Start: 2016-10-12 — End: 2016-10-12
  Administered 2016-10-12 (×2): 10 mg via INTRAVENOUS

## 2016-10-12 MED ORDER — TRAMADOL HCL 50 MG PO TABS: 50.0000 mg | ORAL_TABLET | Freq: Four times a day (QID) | ORAL | 0 refills | Status: DC | PRN

## 2016-10-12 MED ORDER — ONDANSETRON HCL 4 MG/2ML IJ SOLN
4.0000 mg | Freq: Once | INTRAMUSCULAR | Status: DC | PRN
  Filled 2016-10-12: qty 2

## 2016-10-12 MED ORDER — ACETAMINOPHEN 325 MG PO TABS
650.0000 mg | ORAL_TABLET | ORAL | Status: DC | PRN
  Filled 2016-10-12: qty 2

## 2016-10-12 MED ORDER — SODIUM CHLORIDE 0.9% FLUSH
3.0000 mL | Freq: Two times a day (BID) | INTRAVENOUS | Status: DC
  Filled 2016-10-12: qty 3

## 2016-10-12 MED ORDER — PROPOFOL 10 MG/ML IV BOLUS
INTRAVENOUS | Status: DC | PRN
  Administered 2016-10-12: 150 mg via INTRAVENOUS

## 2016-10-12 MED ORDER — SODIUM CHLORIDE 0.9% FLUSH
3.0000 mL | INTRAVENOUS | Status: DC | PRN
  Filled 2016-10-12: qty 3

## 2016-10-12 MED ORDER — CEFAZOLIN SODIUM-DEXTROSE 2-4 GM/100ML-% IV SOLN
INTRAVENOUS | Status: AC
  Filled 2016-10-12: qty 100

## 2016-10-12 MED ORDER — PROPOFOL 10 MG/ML IV BOLUS
INTRAVENOUS | Status: AC
  Filled 2016-10-12: qty 20

## 2016-10-12 MED ORDER — STERILE WATER FOR IRRIGATION IR SOLN
Status: DC | PRN
Start: 2016-10-12 — End: 2016-10-12
  Administered 2016-10-12: 3000 mL
  Administered 2016-10-12: 500 mL

## 2016-10-12 MED ORDER — SULFAMETHOXAZOLE-TRIMETHOPRIM 800-160 MG PO TABS: 1.0000 | ORAL_TABLET | Freq: Two times a day (BID) | ORAL | 0 refills | Status: DC

## 2016-10-12 MED ORDER — ONDANSETRON HCL 4 MG/2ML IJ SOLN
INTRAMUSCULAR | Status: DC | PRN
  Administered 2016-10-12: 4 mg via INTRAVENOUS

## 2016-10-12 MED ORDER — EPHEDRINE 5 MG/ML INJ
INTRAVENOUS | Status: AC
  Filled 2016-10-12: qty 10

## 2016-10-12 MED ORDER — LACTATED RINGERS IV SOLN
INTRAVENOUS | Status: DC
  Administered 2016-10-12 (×2): via INTRAVENOUS
  Filled 2016-10-12: qty 1000

## 2016-10-12 MED ORDER — FENTANYL CITRATE (PF) 100 MCG/2ML IJ SOLN
INTRAMUSCULAR | Status: DC | PRN
  Administered 2016-10-12: 25 ug via INTRAVENOUS
  Administered 2016-10-12: 50 ug via INTRAVENOUS
  Administered 2016-10-12: 25 ug via INTRAVENOUS

## 2016-10-12 MED ORDER — FENTANYL CITRATE (PF) 100 MCG/2ML IJ SOLN
INTRAMUSCULAR | Status: AC
  Filled 2016-10-12: qty 2

## 2016-10-12 MED ORDER — SODIUM CHLORIDE 0.9 % IV SOLN
250.0000 mL | INTRAVENOUS | Status: DC | PRN
  Filled 2016-10-12: qty 250

## 2016-10-12 MED ORDER — LIDOCAINE 2% (20 MG/ML) 5 ML SYRINGE
INTRAMUSCULAR | Status: DC | PRN
  Administered 2016-10-12: 60 mg via INTRAVENOUS

## 2016-10-12 MED FILL — traMADol HCL 50 MG TABS: 50 | 3 days supply | Qty: 15 | Fill #0

## 2016-10-12 MED FILL — SULFAMETHOXAZOLE/TMP DS TAB: 800-160 | 3 days supply | Qty: 6 | Fill #0

## 2016-10-12 SURGICAL SUPPLY — 18 items
BAG DRAIN URO-CYSTO SKYTR STRL (DRAIN) ×2 IMPLANT
BAG DRN ANRFLXCHMBR STRAP LEK (BAG) ×1
BAG DRN UROCATH (DRAIN) ×1
BAG URINE DRAINAGE (UROLOGICAL SUPPLIES) ×2 IMPLANT
BAG URINE LEG 19OZ MD ST LTX (BAG) ×2 IMPLANT
BAG URINE LEG 500ML (DRAIN) ×2 IMPLANT
CATH FOLEY 2WAY SLVR  5CC 18FR (CATHETERS)
CATH FOLEY 2WAY SLVR 5CC 18FR (CATHETERS) IMPLANT
CLOTH BEACON ORANGE TIMEOUT ST (SAFETY) ×2 IMPLANT
GLOVE BIO SURGEON STRL SZ 6.5 (GLOVE) ×1 IMPLANT
GLOVE BIO SURGEON STRL SZ8 (GLOVE) ×2 IMPLANT
GLOVE BIOGEL M 6.5 STRL (GLOVE) ×2 IMPLANT
GOWN STRL REUS W/TWL XL LVL3 (GOWN DISPOSABLE) ×2 IMPLANT
MANIFOLD NEPTUNE II (INSTRUMENTS) ×2 IMPLANT
PACK CYSTO (CUSTOM PROCEDURE TRAY) ×2 IMPLANT
SYSTEM UROLIFT (Male Continence) ×4 IMPLANT
TUBE CONNECTING 12X1/4 (SUCTIONS) ×2 IMPLANT
WATER STERILE IRR 3000ML UROMA (IV SOLUTION) ×4 IMPLANT

## 2016-10-12 NOTE — Op Note (Signed)
Preoperative diagnosis: BPH with obstructive symptomatology.  Postoperative diagnosis: Same  Principal procedure: Urolift procedure, with the placement of 4 implants.  (6 implants were utilized, two implants pulled through)  Surgeon: Velinda Wrobel  Anesthesia: Gen. with LMA  Complications: None  Drains: None  Estimated blood loss: Less than 25 mL  Indications: 71 -year-old male with obstructive symptomatology secondary to BPH.  The patient's symptoms have progressed, and he has requested further management.  Management options including TURP with resection/ablation of the prostate as well as Urolift were discussed.  The patient has chosen to have a Urolift procedure.  He has been instructed to the procedure as well as risks and complications which include but are not limited to infection, bleeding, and inadequate treatment with the Urolift procedure alone, anesthetic complications, among others.  He understands these and desires to proceed.  Findings: Using the 17 French cystoscope, urethra and bladder were inspected.  There were no urethral lesions.  Prostatic urethra was obstructed secondary to bilobar hypertrophy.  The bladder was inspected circumferentially.  This revealed moderate to marked trabeculation and a fairly large wide mouth bladder diverticulum in the right posterior bladder.  All urothelium was normal.  Description of procedure: The patient was properly identified in the holding area.  He received preoperative IV antibiotics.  He was taken to the operating room where general anesthetic was administered with the LMA.  He is placed in the dorsolithotomy position.  Genitalia and perineum were prepped and draped.  Proper timeout was performed.  A 38F cystoscope was inserted into the bladder. The cystoscopy bridge was replaced with a UroLift delivery device.The first treatment site was the patient's right side approximately 1.5cm distal to the bladder neck. The distal tip of the delivery  device was then angled laterally approximately 20 degrees at this position to compress the lateral lobe. The trigger was pulled, thereby deploying a needle containing the implant through the prostate. The needle was then retracted, allowing one end of the implant to be delivered to the capsular surface of the prostate. The implant was then tensioned to assure capsular seating and removal of slack monofilament. The device was then angled back toward midline and slowly advanced proximally until cystoscopic verification of the monofilament being centered in the delivery bay. The urethral end piece was then affixed to the monofilament thereby tailoring the size of the implant. Excess filament was then severed. The delivery device was then re-advanced into the bladder. The delivery device was then replaced with cystoscope and bridge and the implant location and opening effect was confirmed cystoscopically. The same procedure was then repeated on the left side, and 2 additional implants were delivered just proximal to the verumontanum, again one on right and one on left side of the prostate, following the same technique.  The left distal prostatic region required the use of 3 implants.  The first 2 pulled through.  On the third try, without significant compression on the prostatic lobe, the implant was delivered.   A final cystoscopy was conducted first to inspect the location and state of each implant and second, to confirm the presence of a continuous anterior channel was present through the prostatic urethra with irrigation flow turned off. 4 Implants were delivered in total.  Following this, the scope was removed .  Following filling the bladder with approximately 150 mL of water.  He was then awakened and taken to the PACU in stable condition.  He tolerated the procedure well.

## 2016-10-12 NOTE — Transfer of Care (Signed)
Immediate Anesthesia Transfer of Care Note  Patient: Derek [name]  Procedure(s) Performed: Procedure(s) (LRB): CYSTOSCOPY WITH INSERTION OF UROLIFT (N/A)  Patient Location: PACU  Anesthesia Type: General  Level of Consciousness: awake, oriented, sedated and patient cooperative  Airway & Oxygen Therapy: Patient Spontanous Breathing and Patient connected to face mask oxygen  Post-op Assessment: Report given to PACU RN and Post -op Vital signs reviewed and stable  Post vital signs: Reviewed and stable  Complications: No apparent anesthesia complications  Last Vitals:  Vitals:   10/12/16 0943  BP: (!) 144/73  Pulse: (!) 58  Resp: 18  Temp: 36.4 C  SpO2: 100%    Last Pain:  Vitals:   10/12/16 1009  TempSrc:   PainSc: 3       Patients Stated Pain Goal: 4 (10/12/16 1009)

## 2016-10-12 NOTE — Discharge Instructions (Signed)
1. You may see some blood in the urine and may have some burning with urination for 48-72 hours. You also may notice that you have to urinate more frequently or urgently after your procedure which is normal.  2. You should call should you develop an inability urinate, fever > 101, persistent nausea and vomiting that prevents you from eating or drinking to stay hydrated.  3. If you have a stent, you will likely urinate more frequently and urgently until the stent is removed and you may experience some discomfort/pain in the lower abdomen and flank especially when urinating. You may take pain medication prescribed to you if needed for pain. You may also intermittently have blood in the urine until the stent is removed. 4. If you have a catheter, you will be taught how to take care of the catheter by the nursing staff prior to discharge from the hospital.  You may periodically feel a strong urge to void with the catheter in place.  This is a bladder spasm and most often can occur when having a bowel movement or moving around. It is typically self-limited and usually will stop after a few minutes.  You may use some Vaseline or Neosporin around the tip of the catheter to reduce friction at the tip of the penis. You may also see some blood in the urine.  A very small amount of blood can make the urine look quite red.  As long as the catheter is draining well, there usually is not a problem.  However, if the catheter is not draining well and is bloody, you should call the office ([phone]) to notify us.  Post Anesthesia Home Care Instructions  Activity: Get plenty of rest for the remainder of the day. A responsible individual must stay with you for 24 hours following the procedure.  For the next 24 hours, DO NOT: -Drive a car -Operate machinery -Drink alcoholic beverages -Take any medication unless instructed by your physician -Make any legal decisions or sign important papers.  Meals: Start with  liquid foods such as gelatin or soup. Progress to regular foods as tolerated. Avoid greasy, spicy, heavy foods. If nausea and/or vomiting occur, drink only clear liquids until the nausea and/or vomiting subsides. Call your physician if vomiting continues.  Special Instructions/Symptoms: Your throat may feel dry or sore from the anesthesia or the breathing tube placed in your throat during surgery. If this causes discomfort, gargle with warm salt water. The discomfort should disappear within 24 hours.  If you had a scopolamine patch placed behind your ear for the management of post- operative nausea and/or vomiting:  1. The medication in the patch is effective for 72 hours, after which it should be removed.  Wrap patch in a tissue and discard in the trash. Wash hands thoroughly with soap and water. 2. You may remove the patch earlier than 72 hours if you experience unpleasant side effects which may include dry mouth, dizziness or visual disturbances. 3. Avoid touching the patch. Wash your hands with soap and water after contact with the patch.     

## 2016-10-12 NOTE — Anesthesia Preprocedure Evaluation (Addendum)
Anesthesia Evaluation  Patient identified by MRN, date of birth, ID band Patient awake    Reviewed: Allergy & Precautions, NPO status , Patient's Chart, lab work & pertinent test results, reviewed documented beta blocker date and time   History of Anesthesia Complications Negative for: history of anesthetic complications  Airway Mallampati: II  TM Distance: >3 FB Neck ROM: Full    Dental  (+) Partial Lower, Dental Advisory Given   Pulmonary neg shortness of breath, neg COPD, neg recent URI, former smoker,  Welder's lung   breath sounds clear to auscultation       Cardiovascular hypertension, Pt. on medications and Pt. on home beta blockers (-) angina(-) Past MI, (-) Cardiac Stents and (-) Orthopnea Dysrhythmias: incomplete RBBB.  Rhythm:Regular Rate:Normal  NSR. Rate 65. LAD, Incomplete, RBBB   Neuro/Psych neg Seizures PSYCHIATRIC DISORDERS Anxiety Depression Lumbar DDD, Meniere's disease    GI/Hepatic Neg liver ROS, hiatal hernia, GERD  Medicated,  Endo/Other  diabetes, Poorly Controlled, Type 2, Insulin DependentMorbid obesity  Renal/GU Renal InsufficiencyRenal disease (h/o nephrolithiasis)     Musculoskeletal  (+) Arthritis ,   Abdominal (+) + obese,   Peds  Hematology  (+) Blood dyscrasia (Factor V Leiden, no h/o blood clots), ,   Anesthesia Other Findings Pancreatic cancer, hearing loss right ear  Reproductive/Obstetrics                            Anesthesia Physical  Anesthesia Plan  ASA: III  Anesthesia Plan: General   Post-op Pain Management:    Induction: Intravenous  PONV Risk Score and Plan: 2 and Ondansetron and Dexamethasone  Airway Management Planned: LMA  Additional Equipment: None  Intra-op Plan:   Post-operative Plan: Extubation in OR  Informed Consent: I have reviewed the patients History and Physical, chart, labs and discussed the procedure including the  risks, benefits and alternatives for the proposed anesthesia with the patient or authorized representative who has indicated his/her understanding and acceptance.   Dental advisory given  Plan Discussed with: CRNA and Surgeon  Anesthesia Plan Comments:         Anesthesia Quick Evaluation

## 2016-10-12 NOTE — Anesthesia Procedure Notes (Signed)
Procedure Name: LMA Insertion Date/Time: 10/12/2016 11:27 AM Performed by: Denna Haggard D Pre-anesthesia Checklist: Patient identified, Emergency Drugs available, Suction available and Patient being monitored Patient Re-evaluated:Patient Re-evaluated prior to induction Oxygen Delivery Method: Circle system utilized Preoxygenation: Pre-oxygenation with 100% oxygen Induction Type: IV induction Ventilation: Mask ventilation without difficulty LMA: LMA with gastric port inserted LMA Size: 4.0 Number of attempts: 1 Airway Equipment and Method: Bite block Placement Confirmation: positive ETCO2 Tube secured with: Tape Dental Injury: Teeth and Oropharynx as per pre-operative assessment

## 2016-10-13 ENCOUNTER — Encounter (HOSPITAL_BASED_OUTPATIENT_CLINIC_OR_DEPARTMENT_OTHER): Payer: Self-pay | Admitting: Urology

## 2016-10-13 DIAGNOSIS — R3914 Feeling of incomplete bladder emptying: Secondary | ICD-10-CM | POA: Diagnosis not present

## 2016-10-13 DIAGNOSIS — N400 Enlarged prostate without lower urinary tract symptoms: Secondary | ICD-10-CM | POA: Diagnosis not present

## 2016-10-13 NOTE — Anesthesia Postprocedure Evaluation (Signed)
Anesthesia Post Note  Patient: Derek [name]  Procedure(s) Performed: Procedure(s) (LRB): CYSTOSCOPY WITH INSERTION OF UROLIFT (N/A)     Patient location during evaluation: PACU Anesthesia Type: General Level of consciousness: awake and alert Pain management: pain level controlled Vital Signs Assessment: post-procedure vital signs reviewed and stable Respiratory status: spontaneous breathing, nonlabored ventilation, respiratory function stable and patient connected to nasal cannula oxygen Cardiovascular status: blood pressure returned to baseline and stable Postop Assessment: no signs of nausea or vomiting Anesthetic complications: no    Last Vitals:  Vitals:   10/12/16 1430 10/12/16 1515  BP: 128/70 138/71  Pulse: (!) 57 (!) 59  Resp: 16 16  Temp: 36.5 C 36.4 C  SpO2:      Last Pain:  Vitals:   10/12/16 1009  TempSrc:   PainSc: 3                  Eastyn Skalla P Lucindia Lemley

## 2016-10-17 DIAGNOSIS — R339 Retention of urine, unspecified: Secondary | ICD-10-CM | POA: Diagnosis not present

## 2016-10-24 DIAGNOSIS — R8271 Bacteriuria: Secondary | ICD-10-CM | POA: Diagnosis not present

## 2016-10-24 DIAGNOSIS — R3914 Feeling of incomplete bladder emptying: Secondary | ICD-10-CM | POA: Diagnosis not present

## 2016-10-24 MED FILL — CEPHALEXIN 500 MG CAPSULE: 500 | 7 days supply | Qty: 14 | Fill #0

## 2016-10-25 DIAGNOSIS — R3914 Feeling of incomplete bladder emptying: Secondary | ICD-10-CM | POA: Diagnosis not present

## 2016-10-26 MED FILL — TAMSULOSIN HCL 0.4 MG CAP: 0.4 | 30 days supply | Qty: 30 | Fill #2

## 2016-10-26 MED FILL — ULTCARE INS SYR 1 ML 31GX5/: 31G X 5/16" | 33 days supply | Qty: 200 | Fill #1

## 2016-10-30 DIAGNOSIS — L600 Ingrowing nail: Secondary | ICD-10-CM | POA: Diagnosis not present

## 2016-10-30 DIAGNOSIS — Z23 Encounter for immunization: Secondary | ICD-10-CM | POA: Diagnosis not present

## 2016-10-30 DIAGNOSIS — K219 Gastro-esophageal reflux disease without esophagitis: Secondary | ICD-10-CM | POA: Diagnosis not present

## 2016-10-30 DIAGNOSIS — L309 Dermatitis, unspecified: Secondary | ICD-10-CM | POA: Diagnosis not present

## 2016-10-30 DIAGNOSIS — R079 Chest pain, unspecified: Secondary | ICD-10-CM | POA: Diagnosis not present

## 2016-10-30 MED FILL — CLOBETASOL PROPIONATE 0.05: 0.05 | 15 days supply | Qty: 30 | Fill #0

## 2016-10-30 MED FILL — LANSOPRAZOLE 30 MG CAP: 30 | 90 days supply | Qty: 90 | Fill #0

## 2016-10-31 ENCOUNTER — Telehealth: Payer: Self-pay

## 2016-10-31 MED FILL — NITROFURANTOIN MCR 100 MG C: 100 | 10 days supply | Qty: 20 | Fill #0

## 2016-10-31 NOTE — Telephone Encounter (Signed)
SENT NOTES TO SCHEDULING 

## 2016-11-01 ENCOUNTER — Ambulatory Visit (INDEPENDENT_AMBULATORY_CARE_PROVIDER_SITE_OTHER): Payer: PPO | Admitting: Cardiology

## 2016-11-01 ENCOUNTER — Encounter: Payer: Self-pay | Admitting: Cardiology

## 2016-11-01 VITALS — BP 116/66 | HR 67 | Ht 72.0 in | Wt 276.0 lb

## 2016-11-01 DIAGNOSIS — I10 Essential (primary) hypertension: Secondary | ICD-10-CM

## 2016-11-01 DIAGNOSIS — R079 Chest pain, unspecified: Secondary | ICD-10-CM

## 2016-11-01 DIAGNOSIS — E1165 Type 2 diabetes mellitus with hyperglycemia: Secondary | ICD-10-CM

## 2016-11-01 MED ORDER — NITROGLYCERIN 0.4 MG SL SUBL: 0.4000 mg | SUBLINGUAL_TABLET | SUBLINGUAL | 3 refills | Status: DC | PRN

## 2016-11-01 MED FILL — NITROGLYCERIN 0.4 MG TAB SL: 0.4 | 9 days supply | Qty: 25 | Fill #0

## 2016-11-01 NOTE — Progress Notes (Signed)
Cardiology Office Note:    Date:  11/01/2016   ID:  Derek [name], DOB December 16, 1945, MRN [id number]  PCP:  [name], MD  Cardiologist:  Jenean Lindau, MD   Referring MD: [name], MD    ASSESSMENT:    1. Chest pain, unspecified type   2. Benign essential HTN   3. Poorly controlled type 2 diabetes mellitus ([place])   4. Morbid obesity ([place])    PLAN:    In order of problems listed above:  1. Primary prevention stressed to the patient. Importance of compliance with diet and medications stressed understanding. His symptoms are not typical for coronary etiology however in view of multiple risk factors for coronary artery disease he will undergo Lexiscan sestamibi. 2. Diet was discussed with dyslipidemia and diabetes mellitus and obesity. Risks of obesity explained and he vocalized understanding. 3. Sublingual nitroglycerin prescription was given. His protocol and 911 protocol explained. He verbalized understanding. He knows to follow-up appointment    Medication Adjustments/Labs and Tests Ordered: Current medicines are reviewed at length with the patient today.  Concerns regarding medicines are outlined above.  No orders of the defined types were placed in this encounter.  No orders of the defined types were placed in this encounter.    History of Present Illness:    Derek [name] is a 71 y.o. male who is being seen today for the evaluation of preoperative risk assessment at the request of [name], MD. Patient is a pleasant 71 year old male. He has past medical history of essential hypertension and diabetes mellitus.He's had multiple abdominal surgeries. He is here for evaluation chest pain. He mentions to me that he has substernal chest tightness at times. This is not related to exertion. No orthopnea or PND. He leads a sedentary lifestyle because of orthopedic problems. He is morbidly obese. He has significant diabetes mellitus. At the time of my  evaluation is alert awake oriented and in no distress. There is no radiation of these chest tightness symptoms to the neck or to the arms. His wife accompanies him.  Past Medical History:  Diagnosis Date  . Anxiety   . Arthritis   . Bilateral leg cramps    takes magnesium  . BPH (benign prostatic hyperplasia)   . DDD (degenerative disc disease), lumbar    gets back injections   . Deafness in right ear    meniere's disease  . Depression   . Dermatitis    bilateral hands  . Dyspnea on exertion   . Factor V Leiden mutation ([place])    "never had blood clots"  . GERD (gastroesophageal reflux disease)   . Headache   . Hiatal hernia   . History of kidney stones    multiple kidney stones-not a problem at present  . 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 History of panic attacks   . 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 Hypertension   . Incomplete emptying of bladder   . Incomplete right bundle branch block   . Insulin dependent type 2 diabetes mellitus, uncontrolled ([place]) followed by dr [name] ([facility])   dx 2007--- last A1c 10.2 on 09-15-2016  per pt wife, am bs's 115 to 265 ( past 10 days)  . Lower urinary tract symptoms (  LUTS)   . Meniere's disease dx 1970s   intermittant vertigo  . Obstructive sleep apnea    " i used to have sleep apnea" never used a c-pap   . Open wound of abdomen    WET/DRY DRESSING CHANGES TID--- POST ABD. SURGERY 09-21-2016  . Peripheral vascular disease (HCC)    right leg - decreased pulse   . Wears glasses   . Wears partial dentures    LOWER  . 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 ANAL FISTULECTOMY N/A 04/01/2013   Procedure: EXAM UNDER ANESTHESIA WITH ANAL FISSURectomy, sphincterotomy and internal  hemorrhoidectomy;  Surgeon: Harl Bowie, MD;  Location: [place];  Service: General;  Laterality: N/A;  . APPLICATION OF WOUND VAC N/A 05/16/2015   Procedure: APPLICATION OF WOUND VAC;  Surgeon: Armandina Gemma, MD;  [facility];  Service: General;  Laterality: N/A;  . APPLICATION OF WOUND VAC N/A 05/20/2015   Procedure: EXHANGE OF ABDOMINAL  WOUND VAC DRESSING;  Surgeon: Armandina Gemma, MD;  [facility];  Service: General;  Laterality: N/A;  . COLONOSCOPY    . CYSTOSCOPY WITH INSERTION OF UROLIFT N/A 10/12/2016   Procedure: CYSTOSCOPY WITH INSERTION OF UROLIFT;  Surgeon: Franchot Gallo, MD;  Location[facility];  Service: Urology;  Laterality: N/A;  . EUS N/A 12/31/2014   Procedure: UPPER ENDOSCOPIC ULTRASOUND (EUS) LINEAR;  Surgeon: Milus Banister, MD;  [facility];  Service: Endoscopy;  Laterality: N/A;  . HARDWARE REMOVAL Left 2013   left ankle  . HEMORRHOIDECTOMY WITH HEMORRHOID BANDING    . INCISION AND DRAINAGE ABSCESS N/A 05/14/2015   Procedure: DRAINAGE OF INFECTED PANCREATIC PSEUDOCYST;  Surgeon: Armandina Gemma, MD;  [facility];  Service: General;  Laterality: N/A;  . INCISION AND DRAINAGE OF WOUND N/A 05/16/2015   Procedure: IRRIGATION AND DEBRIDEMENT WOUND;  Surgeon: Armandina Gemma, MD;  [facility];  Service: General;  Laterality: N/A;  . INCISIONAL HERNIA REPAIR N/A 11/12/2015   Procedure: [place];  Surgeon: Armandina Gemma, MD;  Location: [place];  Service: General;  Laterality: N/A;  . INGUINAL HERNIA REPAIR Bilateral 1974;  1982  . INSERTION OF MESH N/A 11/12/2015   Procedure: INSERTION OF MESH;  Surgeon: Armandina Gemma, MD;  Location: [place];  Service: General;  Laterality: N/A;  . IR GENERIC HISTORICAL  04/20/2015   IR RADIOLOGIST EVAL & MGMT 04/20/2015 [facility] INTERV RAD  . KNEE ARTHROSCOPY Bilateral right 08-08-2000;  left 11-23-2000   meniscal repair and chondroplasty's  . KNEE ARTHROSCOPY   03/01/2012   Procedure: ARTHROSCOPY KNEE;  Surgeon: Gearlean Alf, MD;  [facility];  Service: Orthopedics;  Laterality: Left;  WITH SYNOVECTOMY   . LAPAROTOMY N/A 05/14/2015   Procedure: EXPLORATORY LAPAROTOMY;  Surgeon: Armandina Gemma, MD;  [facility];  Service: General;  Laterality: N/A;  . ORIF LEFT ANKLE FX  08/20/2009   distal tib-fib and malleolus  . ROTATOR CUFF REPAIR Bilateral right 1993; left 1994  . TOTAL KNEE ARTHROPLASTY Right 06/02/2013   Procedure: RIGHT TOTAL KNEE ARTHROPLASTY;  Surgeon: Gearlean Alf, MD;  [facility];  Service: Orthopedics;  Laterality: Right;  . TOTAL KNEE ARTHROPLASTY Left 01-31-2008   dr Wynelle Link  . UMBILICAL HERNIA REPAIR  08-11-1999   dr Ninfa Linden  . UPPER GASTROINTESTINAL ENDOSCOPY  sept 2016  . WOUND EXPLORATION N/A 09/21/2016   Procedure:  WOUND EXPLORATION AND DEBRIDEMENT ABDOMINAL WALL;  Surgeon: Armandina Gemma, MD;  [facility];  Service: General;  Laterality: N/A;    Current Medications: Current Meds  Medication Sig  . aspirin EC 81 MG tablet Take 81 mg by mouth at bedtime.  . carvedilol (COREG) 25 MG tablet Take 0.5 tablets (12.5 mg total) by mouth 2 (two) times daily with a meal. (Patient taking differently: Take 25 mg by mouth at bedtime. )  . chlorpheniramine (CHLOR-TRIMETON) 4 MG tablet Take 4 mg by mouth at bedtime.  . clobetasol ointment (TEMOVATE) 9.41 % Apply 1 application topically daily as needed (for peeling skin on hands and feet). Reported on 03/17/2015  . [employer] TRIPLE MAGNESIUM COMPLEX PO Take 2 capsules by mouth every evening.   Marland Kitchen HYDROmorphone (DILAUDID) 4 MG tablet Take 2-4 mg by mouth every 4 (four) hours as needed (for pain.).   Marland Kitchen insulin aspart (NOVOLOG) 100 UNIT/ML injection Inject 0-9 Units into the skin 3 (three) times daily before meals. (Patient taking differently: Inject into the skin 3 (three) times daily before meals. SSC:  70-150 = 10u/  151-200 = 12u/  201-250 = 14u/  251-300 = 16u/  >300 = 18u, T, continue t test BS 4-6/day pre and post meal for insulin adjustment)  . insulin NPH Human (HUMULIN N,NOVOLIN N) 100 UNIT/ML injection Inject into the skin 2 (two) times daily before a meal. 55 units in am and 65 units qhs  . lansoprazole (PREVACID) 15 MG capsule Take 30-90 mg by mouth daily as needed (for acid reflux.).   Marland Kitchen losartan (COZAAR) 50 MG tablet Take 50 mg by mouth at bedtime.   . Probiotic Product (PROBIOTIC PO) Take 1 capsule by mouth at bedtime.  . tamsulosin (FLOMAX) 0.4 MG CAPS capsule Take 0.4 mg by mouth at bedtime.  . vitamin C (ASCORBIC ACID) 500 MG tablet Take 500 mg by mouth daily.     Allergies:   Oxycontin [oxycodone hcl]; Quinine; Voltaren [diclofenac sodium]; and Morphine and related   Social History   Social History  . Marital status: Married    Spouse name: N/A  . Number of children: 2  . Years of education: N/A   Occupational History  . retired     Social History Main Topics  . Smoking status: Former Smoker    Packs/day: 4.00    Years: 17.00    Types: Cigarettes    Quit date: 02/21/1975  . Smokeless tobacco: Never Used  . Alcohol use No  . Drug use: No  . Sexual activity: Yes   Other Topics Concern  . None   Social History Narrative   Married, retired Scientist, product/process development   1 son 1 daughter   2 caffeine drinks/day   10/18/2014        Family History: The patient's family history includes Bone cancer in his father; Diabetes in his maternal grandmother; Factor V Leiden deficiency in his daughter; Heart disease in his paternal uncle. There is no history of Colon cancer, Esophageal cancer, Stomach cancer, or Rectal cancer.  ROS:   Please see the history of present illness.    All other systems reviewed and are negative.  EKGs/Labs/Other Studies Reviewed:    The following studies were reviewed today: I reviewed records from primary care physician office. EKG done today reveals sinus rhythm and nonspecific ST-T changes.   Recent  Labs: 09/15/2016: BUN 28; Creatinine, Ser 1.17; Hemoglobin 12.4; Platelets 467; Potassium 5.0; Sodium 137  Recent Lipid Panel    Component  Value Date/Time   CHOL 109 05/05/2013 1020   TRIG 120 05/05/2013 1020   HDL 43 05/05/2013 1020   LDLCALC 42 05/05/2013 1020    Physical Exam:    VS:  BP 116/66   Pulse 67   Ht 6' (1.829 m)   Wt 276 lb 0.6 oz (125.2 kg)   SpO2 96%   BMI 37.44 kg/m     Wt Readings from Last 3 Encounters:  11/01/16 276 lb 0.6 oz (125.2 kg)  10/12/16 268 lb (121.6 kg)  09/21/16 267 lb (121.1 kg)     GEN: Patient is in no acute distress HEENT: Normal NECK: No JVD; No carotid bruits LYMPHATICS: No lymphadenopathy CARDIAC: S1 S2 regular, 2/6 systolic murmur at the apex. RESPIRATORY:  Clear to auscultation without rales, wheezing or rhonchi  ABDOMEN: Soft, non-tender, non-distended MUSCULOSKELETAL:  No edema; No deformity  SKIN: Warm and dry NEUROLOGIC:  Alert and oriented x 3 PSYCHIATRIC:  Normal affect    Signed, Jenean Lindau, MD  11/01/2016 3:09 PM    [facility]

## 2016-11-01 NOTE — Patient Instructions (Addendum)
Medication Instructions:   Your physician recommends that you continue on your current medications as directed. Please refer to the Current Medication list given to you today.  TAKE AS NEEDED: Nitroglycerin 0.4 mg is for chest pain as needed.   Labwork:  NONE  Testing/Procedures:  Your physician has requested that you have a lexiscan myoview. For further information please visit [email or web address]. Please follow instruction sheet, as given.    Follow-Up:  Your physician recommends that you schedule a follow-up appointment in: 4 weeks with Dr. Geraldo Pitter.    Any Other Special Instructions Will Be Listed Below (If Applicable).     If you need a refill on your cardiac medications before your next appointment, please call your pharmacy.

## 2016-11-02 MED FILL — ONETOUCH VERIO TEST STRIP: 34 days supply | Qty: 100 | Fill #2

## 2016-11-08 MED FILL — NovoLOG 100 UNIT/ML SOLN: 100 | 42 days supply | Qty: 20 | Fill #3

## 2016-11-13 ENCOUNTER — Telehealth (HOSPITAL_COMMUNITY): Payer: Self-pay | Admitting: *Deleted

## 2016-11-13 NOTE — Telephone Encounter (Signed)
Patient given detailed instructions per Myocardial Perfusion Study Information Sheet for the test on 11/15/16. Patient notified to arrive 15 minutes early and that it is imperative to arrive on time for appointment to keep from having the test rescheduled.  If you need to cancel or reschedule your appointment, please call the office within 24 hours of your appointment. . Patient verbalized understanding. Derek [name]

## 2016-11-15 ENCOUNTER — Ambulatory Visit (HOSPITAL_COMMUNITY): Payer: PPO | Attending: Internal Medicine

## 2016-11-15 DIAGNOSIS — R079 Chest pain, unspecified: Secondary | ICD-10-CM

## 2016-11-15 DIAGNOSIS — I10 Essential (primary) hypertension: Secondary | ICD-10-CM | POA: Insufficient documentation

## 2016-11-15 DIAGNOSIS — I2510 Atherosclerotic heart disease of native coronary artery without angina pectoris: Secondary | ICD-10-CM | POA: Insufficient documentation

## 2016-11-15 DIAGNOSIS — E119 Type 2 diabetes mellitus without complications: Secondary | ICD-10-CM | POA: Diagnosis not present

## 2016-11-15 DIAGNOSIS — R0609 Other forms of dyspnea: Secondary | ICD-10-CM | POA: Diagnosis not present

## 2016-11-15 DIAGNOSIS — E1165 Type 2 diabetes mellitus with hyperglycemia: Secondary | ICD-10-CM | POA: Diagnosis not present

## 2016-11-15 DIAGNOSIS — R9439 Abnormal result of other cardiovascular function study: Secondary | ICD-10-CM | POA: Diagnosis not present

## 2016-11-15 MED ORDER — REGADENOSON 0.4 MG/5ML IV SOLN
0.4000 mg | Freq: Once | INTRAVENOUS | Status: AC
  Administered 2016-11-15: 0.4 mg via INTRAVENOUS

## 2016-11-15 MED ORDER — TECHNETIUM TC 99M TETROFOSMIN IV KIT
31.8000 | PACK | Freq: Once | INTRAVENOUS | Status: AC | PRN
  Administered 2016-11-15: 31.8 via INTRAVENOUS
  Filled 2016-11-15: qty 32

## 2016-11-16 ENCOUNTER — Ambulatory Visit (HOSPITAL_COMMUNITY): Payer: PPO | Attending: Cardiology

## 2016-11-16 DIAGNOSIS — R3914 Feeling of incomplete bladder emptying: Secondary | ICD-10-CM | POA: Diagnosis not present

## 2016-11-16 LAB — MYOCARDIAL PERFUSION IMAGING
LV dias vol: 161 mL (ref 62–150)
LV sys vol: 66 mL
Peak HR: 82 {beats}/min
RATE: 0.42
Rest HR: 65 {beats}/min
SDS: 5
SRS: 2
SSS: 7
TID: 1.55

## 2016-11-16 MED ORDER — TECHNETIUM TC 99M TETROFOSMIN IV KIT
31.6000 | PACK | Freq: Once | INTRAVENOUS | Status: AC | PRN
  Administered 2016-11-16: 31.6 via INTRAVENOUS
  Filled 2016-11-16: qty 32

## 2016-11-20 DIAGNOSIS — R3914 Feeling of incomplete bladder emptying: Secondary | ICD-10-CM | POA: Diagnosis not present

## 2016-11-29 DIAGNOSIS — N138 Other obstructive and reflux uropathy: Secondary | ICD-10-CM | POA: Diagnosis not present

## 2016-11-29 DIAGNOSIS — R300 Dysuria: Secondary | ICD-10-CM | POA: Diagnosis not present

## 2016-11-29 DIAGNOSIS — N401 Enlarged prostate with lower urinary tract symptoms: Secondary | ICD-10-CM | POA: Diagnosis not present

## 2016-11-29 MED FILL — AMOX-CLAV 500-125 MG TABLET: 500-125 | 7 days supply | Qty: 14 | Fill #0

## 2016-11-30 MED FILL — TAMSULOSIN HCL 0.4 MG CAP: 0.4 | 30 days supply | Qty: 30 | Fill #0

## 2016-12-01 ENCOUNTER — Encounter: Payer: Self-pay | Admitting: Cardiology

## 2016-12-01 ENCOUNTER — Ambulatory Visit (INDEPENDENT_AMBULATORY_CARE_PROVIDER_SITE_OTHER): Payer: PPO | Admitting: Cardiology

## 2016-12-01 VITALS — BP 122/68 | HR 81 | Ht 72.0 in | Wt 272.1 lb

## 2016-12-01 DIAGNOSIS — I10 Essential (primary) hypertension: Secondary | ICD-10-CM | POA: Diagnosis not present

## 2016-12-01 DIAGNOSIS — E1165 Type 2 diabetes mellitus with hyperglycemia: Secondary | ICD-10-CM

## 2016-12-01 DIAGNOSIS — G4733 Obstructive sleep apnea (adult) (pediatric): Secondary | ICD-10-CM | POA: Diagnosis not present

## 2016-12-01 NOTE — Patient Instructions (Signed)
Medication Instructions:  Your physician recommends that you continue on your current medications as directed. Please refer to the Current Medication list given to you today.   Labwork: None  Testing/Procedures: None  Follow-Up: 6 months  Any Other Special Instructions Will Be Listed Below (If Applicable).     If you need a refill on your cardiac medications before your next appointment, please call your pharmacy.   

## 2016-12-01 NOTE — Progress Notes (Signed)
Cardiology Office Note:    Date:  12/01/2016   ID:  Derek [name], DOB 08/23/1945, MRN [id number]  PCP:  [name], MD  Cardiologist:  Jenean Lindau, MD   Referring MD: [name], MD    ASSESSMENT:    1. Benign essential HTN   2. Obstructive sleep apnea   3. Poorly controlled type 2 diabetes mellitus ([place])   4. Morbid obesity ([place])    PLAN:    In order of problems listed above:  1. The patient's stress test was mildly abnormal. This is a low-risk scan. I discussed my findings with the patient at extensive length and he and his wife localized understanding. I told him that especially in view of the fact that is a diabetic he could consider oronary angiography. I discussed benefits and risks and potential. He mentions to me that he is feeling fine now with activities of daily living and would not be in a rush to get it done. I respect his wishes. 2. Importance of regular exercise stressed. Diet was discussed with dyslipidemia and diabetes mellitus and obesity. Risks of obesity explained and he verbalized understanding. He'll be seen in follow-up appointment in 6 months or earlier if he has any concerns. He needs to be on statin therapy as he has history of diabetes mellitus.. I did tell him to get in touch with his primary care doctor for the same and he said he will do so as he has an upcoming appointment.   Medication Adjustments/Labs and Tests Ordered: Current medicines are reviewed at length with the patient today.  Concerns regarding medicines are outlined above.  No orders of the defined types were placed in this encounter.  No orders of the defined types were placed in this encounter.    Chief Complaint  Patient presents with  . Follow-up    4 week flup after nuclear stress test      History of Present Illness:    Derek [name] is a 71 y.o. male  The patient was evaluated by me for chest pain. His stress test abnormal are very mildly abnormal.  He tells me that he takes care of activities of daily living. No chest pain orthopnea or PND. At the time of my evaluation he is alert awake oriented and in no distress. His wife accompanies him.  Past Medical History:  Diagnosis Date  . Anxiety   . Arthritis   . Bilateral leg cramps    takes magnesium  . BPH (benign prostatic hyperplasia)   . DDD (degenerative disc disease), lumbar    gets back injections   . Deafness in right ear    meniere's disease  . Depression   . Dermatitis    bilateral hands  . Dyspnea on exertion   . Factor V Leiden mutation ([place])    "never had blood clots"  . GERD (gastroesophageal reflux disease)   . Headache   . Hiatal hernia   . History of kidney stones    multiple kidney stones-not a problem at present  . 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 History of panic attacks   . 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 Hypertension   . Incomplete emptying of bladder   . Incomplete right bundle branch block   .  Insulin dependent type 2 diabetes mellitus, uncontrolled ([place]) followed by dr [name] ([facility])   dx 2007--- last A1c 10.2 on 09-15-2016  per pt wife, am bs's 115 to 265 ( past 10 days)  . Lower urinary tract symptoms (LUTS)   . Meniere's disease dx 1970s   intermittant vertigo  . Obstructive sleep apnea    " i used to have sleep apnea" never used a c-pap   . Open wound of abdomen    WET/DRY DRESSING CHANGES TID--- POST ABD. SURGERY 09-21-2016  . Peripheral vascular disease (HCC)    right leg - decreased pulse   . Wears glasses   . Wears partial dentures    LOWER  . 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 ANAL FISTULECTOMY N/A 04/01/2013   Procedure: EXAM UNDER ANESTHESIA WITH ANAL  FISSURectomy, sphincterotomy and internal hemorrhoidectomy;  Surgeon: Harl Bowie, MD;  Location: [place];  Service: General;  Laterality: N/A;  . APPLICATION OF WOUND VAC N/A 05/16/2015   Procedure: APPLICATION OF WOUND VAC;  Surgeon: Armandina Gemma, MD;  [facility];  Service: General;  Laterality: N/A;  . APPLICATION OF WOUND VAC N/A 05/20/2015   Procedure: EXHANGE OF ABDOMINAL  WOUND VAC DRESSING;  Surgeon: Armandina Gemma, MD;  [facility];  Service: General;  Laterality: N/A;  . COLONOSCOPY    . CYSTOSCOPY WITH INSERTION OF UROLIFT N/A 10/12/2016   Procedure: CYSTOSCOPY WITH INSERTION OF UROLIFT;  Surgeon: Franchot Gallo, MD;  Location: [facility];  Service: Urology;  Laterality: N/A;  . EUS N/A 12/31/2014   Procedure: UPPER ENDOSCOPIC ULTRASOUND (EUS) LINEAR;  Surgeon: Milus Banister, MD;  [facility];  Service: Endoscopy;  Laterality: N/A;  . HARDWARE REMOVAL Left 2013   left ankle  . HEMORRHOIDECTOMY WITH HEMORRHOID BANDING    . INCISION AND DRAINAGE ABSCESS N/A 05/14/2015   Procedure: DRAINAGE OF INFECTED PANCREATIC PSEUDOCYST;  Surgeon: Armandina Gemma, MD;  [facility];  Service: General;  Laterality: N/A;  . INCISION AND DRAINAGE OF WOUND N/A 05/16/2015   Procedure: IRRIGATION AND DEBRIDEMENT WOUND;  Surgeon: Armandina Gemma, MD;  [facility];  Service: General;  Laterality: N/A;  . INCISIONAL HERNIA REPAIR N/A 11/12/2015   Procedure: [place];  Surgeon: Armandina Gemma, MD;  Location: [place];  Service: General;  Laterality: N/A;  . INGUINAL HERNIA REPAIR Bilateral 1974;  1982  . INSERTION OF MESH N/A 11/12/2015   Procedure: INSERTION OF MESH;  Surgeon: Armandina Gemma, MD;  Location: [place];  Service: General;  Laterality: N/A;  . IR GENERIC HISTORICAL  04/20/2015   IR RADIOLOGIST EVAL & MGMT 04/20/2015 [facility] INTERV RAD  . KNEE ARTHROSCOPY Bilateral right 08-08-2000;  left 11-23-2000   meniscal repair and  chondroplasty's  . KNEE ARTHROSCOPY  03/01/2012   Procedure: ARTHROSCOPY KNEE;  Surgeon: Gearlean Alf, MD;  Location: [facility];  Service: Orthopedics;  Laterality: Left;  WITH SYNOVECTOMY   . LAPAROTOMY N/A 05/14/2015   Procedure: EXPLORATORY LAPAROTOMY;  Surgeon: Armandina Gemma, MD;  [facility];  Service: General;  Laterality: N/A;  . ORIF LEFT ANKLE FX  08/20/2009   distal tib-fib and malleolus  . ROTATOR CUFF REPAIR Bilateral right 1993; left 1994  . TOTAL KNEE ARTHROPLASTY Right 06/02/2013   Procedure: RIGHT TOTAL KNEE ARTHROPLASTY;  Surgeon: Gearlean Alf, MD;  [facility];  Service: Orthopedics;  Laterality: Right;  . TOTAL KNEE ARTHROPLASTY Left 01-31-2008   dr Wynelle Link  . UMBILICAL HERNIA REPAIR  08-11-1999   dr Ninfa Linden  . UPPER GASTROINTESTINAL ENDOSCOPY  sept 2016  . WOUND EXPLORATION N/A 09/21/2016   Procedure: WOUND EXPLORATION AND DEBRIDEMENT ABDOMINAL WALL;  Surgeon: Armandina Gemma, MD;  [facility];  Service: General;  Laterality: N/A;    Current Medications: Current Meds  Medication Sig  . amoxicillin-clavulanate (AUGMENTIN) 500-125 MG tablet Take 1 tablet by mouth 2 (two) times daily.  Marland Kitchen aspirin EC 81 MG tablet Take 81 mg by mouth at bedtime.  . carvedilol (COREG) 25 MG tablet Take 0.5 tablets (12.5 mg total) by mouth 2 (two) times daily with a meal. (Patient taking differently: Take 25 mg by mouth at bedtime. )  . chlorpheniramine (CHLOR-TRIMETON) 4 MG tablet Take 4 mg by mouth at bedtime.  . clobetasol ointment (TEMOVATE) [id number] % Apply 1 application topically daily as needed (for peeling skin on hands and feet). Reported on 03/17/2015  . [employer] TRIPLE MAGNESIUM COMPLEX PO Take 2 capsules by mouth every evening.   Marland Kitchen HYDROmorphone (DILAUDID) 4 MG tablet Take 2-4 mg by mouth every 4 (four) hours as needed (for pain.).   Marland Kitchen insulin aspart (NOVOLOG) 100 UNIT/ML injection Inject 0-9 Units into the skin 3 (three) times daily before meals. (Patient taking  differently: Inject 20 Units into the skin 3 (three) times daily before meals. SSC:  70-150 = 10u/  151-200 = 12u/  201-250 = 14u/  251-300 = 16u/ >300 = 18u, T, continue t test BS 4-6/day pre and post meal for insulin adjustment)  . insulin NPH Human (HUMULIN N,NOVOLIN N) 100 UNIT/ML injection Inject into the skin 2 (two) times daily before a meal. 65 units in am and 65 units qhs  . lansoprazole (PREVACID) 15 MG capsule Take 30-90 mg by mouth daily as needed (for acid reflux.).   Marland Kitchen losartan (COZAAR) 50 MG tablet Take 50 mg by mouth at bedtime.   . nitroGLYCERIN (NITROSTAT) 0.4 MG SL tablet Place 1 tablet (0.4 mg total) under the tongue every 5 (five) minutes as needed for chest pain.  . Probiotic Product (PROBIOTIC PO) Take 1 capsule by mouth at bedtime.  . tamsulosin (FLOMAX) 0.4 MG CAPS capsule Take 0.4 mg by mouth at bedtime.  . vitamin C (ASCORBIC ACID) 500 MG tablet Take 500 mg by mouth daily.     Allergies:   Oxycontin [oxycodone hcl]; Quinine; Voltaren [diclofenac sodium]; and Morphine and related   Social History   Social History  . Marital status: Married    Spouse name: N/A  . Number of children: 2  . Years of education: N/A   Occupational History  . retired     Social History Main Topics  . Smoking status: Former Smoker    Packs/day: 4.00    Years: 17.00    Types: Cigarettes    Quit date: 02/21/1975  . Smokeless tobacco: Never Used  . Alcohol use No  . Drug use: No  . Sexual activity: Yes   Other Topics Concern  . None   Social History Narrative   Married, retired Scientist, product/process development   1 son 1 daughter   2 caffeine drinks/day   10/18/2014        Family History: The patient's family history includes Bone cancer in his father; Diabetes in his maternal grandmother; Factor V Leiden deficiency in his daughter; Heart disease in his paternal uncle. There is no history of Colon cancer, Esophageal  cancer, Stomach cancer, or Rectal cancer.  ROS:   Please see the history of  present illness.    All other systems reviewed and are negative.  EKGs/Labs/Other Studies Reviewed:    The following studies were reviewed today: I discussed my findings with the patient at extensive length including stress test report.   Recent Labs: 09/15/2016: BUN 28; Creatinine, Ser 1.17; Hemoglobin 12.4; Platelets 467; Potassium 5.0; Sodium 137  Recent Lipid Panel    Component Value Date/Time   CHOL 109 05/05/2013 1020   TRIG 120 05/05/2013 1020   HDL 43 05/05/2013 1020   LDLCALC 42 05/05/2013 1020    Physical Exam:    VS:  BP 122/68 (BP Location: Left Arm, Patient Position: Sitting)   Pulse 81   Ht 6' (1.829 m)   Wt 272 lb 1.9 oz (123.4 kg)   SpO2 96%   BMI 36.91 kg/m     Wt Readings from Last 3 Encounters:  12/01/16 272 lb 1.9 oz (123.4 kg)  11/15/16 276 lb (125.2 kg)  11/01/16 276 lb 0.6 oz (125.2 kg)     GEN: Patient is in no acute distress HEENT: Normal NECK: No JVD; No carotid bruits LYMPHATICS: No lymphadenopathy CARDIAC: Hear sounds regular, 2/6 systolic murmur at the apex. RESPIRATORY:  Clear to auscultation without rales, wheezing or rhonchi  ABDOMEN: Soft, non-tender, non-distended MUSCULOSKELETAL:  No edema; No deformity  SKIN: Warm and dry NEUROLOGIC:  Alert and oriented x 3 PSYCHIATRIC:  Normal affect   Signed, Jenean Lindau, MD  12/01/2016 2:50 PM    [facility]

## 2016-12-05 DIAGNOSIS — L600 Ingrowing nail: Secondary | ICD-10-CM | POA: Diagnosis not present

## 2016-12-06 MED FILL — HYDROmorphone HCL 4 MG TABS: 4 | 30 days supply | Qty: 60 | Fill #0

## 2016-12-18 MED FILL — ONETOUCH VERIO TEST STRIP: 34 days supply | Qty: 100 | Fill #3

## 2016-12-22 MED FILL — LOSARTAN POTASSIUM 50 MG TA: 50 | 90 days supply | Qty: 90 | Fill #0

## 2016-12-26 DIAGNOSIS — R3914 Feeling of incomplete bladder emptying: Secondary | ICD-10-CM | POA: Diagnosis not present

## 2016-12-27 DIAGNOSIS — G894 Chronic pain syndrome: Secondary | ICD-10-CM | POA: Diagnosis not present

## 2016-12-27 DIAGNOSIS — G8929 Other chronic pain: Secondary | ICD-10-CM | POA: Diagnosis not present

## 2016-12-27 DIAGNOSIS — M5442 Lumbago with sciatica, left side: Secondary | ICD-10-CM | POA: Diagnosis not present

## 2016-12-27 DIAGNOSIS — M5136 Other intervertebral disc degeneration, lumbar region: Secondary | ICD-10-CM | POA: Diagnosis not present

## 2017-01-05 DIAGNOSIS — R300 Dysuria: Secondary | ICD-10-CM | POA: Diagnosis not present

## 2017-01-05 DIAGNOSIS — N390 Urinary tract infection, site not specified: Secondary | ICD-10-CM | POA: Diagnosis not present

## 2017-01-05 MED FILL — CIPROFLOXACIN HCL 500 MG TA: 500 | 10 days supply | Qty: 20 | Fill #0

## 2017-01-05 MED FILL — TAMSULOSIN HCL 0.4 MG CAP: 0.4 | 30 days supply | Qty: 30 | Fill #1

## 2017-01-15 MED FILL — ONETOUCH VERIO TEST STRIP: 34 days supply | Qty: 100 | Fill #4

## 2017-01-15 MED FILL — NovoLOG 100 UNIT/ML SOLN: 100 | 42 days supply | Qty: 20 | Fill #4

## 2017-01-19 DIAGNOSIS — L570 Actinic keratosis: Secondary | ICD-10-CM | POA: Diagnosis not present

## 2017-01-19 DIAGNOSIS — N390 Urinary tract infection, site not specified: Secondary | ICD-10-CM | POA: Diagnosis not present

## 2017-01-19 MED FILL — ULTCARE INS SYR 1 ML 31GX5/: 31G X 5/16" | 33 days supply | Qty: 200 | Fill #2

## 2017-01-19 MED FILL — CEPHALEXIN 500 MG CAPSULE: 500 | 30 days supply | Qty: 90 | Fill #0

## 2017-01-23 DIAGNOSIS — R3914 Feeling of incomplete bladder emptying: Secondary | ICD-10-CM | POA: Diagnosis not present

## 2017-01-24 MED FILL — LANSOPRAZOLE DR 30 MG CAP: 30 | 90 days supply | Qty: 90 | Fill #1

## 2017-02-09 DIAGNOSIS — R3914 Feeling of incomplete bladder emptying: Secondary | ICD-10-CM | POA: Diagnosis not present

## 2017-02-19 DIAGNOSIS — E119 Type 2 diabetes mellitus without complications: Secondary | ICD-10-CM | POA: Diagnosis not present

## 2017-02-19 DIAGNOSIS — I10 Essential (primary) hypertension: Secondary | ICD-10-CM | POA: Diagnosis not present

## 2017-02-19 DIAGNOSIS — R809 Proteinuria, unspecified: Secondary | ICD-10-CM | POA: Diagnosis not present

## 2017-02-22 MED FILL — ONETOUCH VERIO TEST STRIP: 34 days supply | Qty: 100 | Fill #5

## 2017-03-01 DIAGNOSIS — L723 Sebaceous cyst: Secondary | ICD-10-CM | POA: Diagnosis not present

## 2017-03-01 DIAGNOSIS — F339 Major depressive disorder, recurrent, unspecified: Secondary | ICD-10-CM | POA: Diagnosis not present

## 2017-03-01 DIAGNOSIS — D485 Neoplasm of uncertain behavior of skin: Secondary | ICD-10-CM | POA: Diagnosis not present

## 2017-03-01 DIAGNOSIS — E1065 Type 1 diabetes mellitus with hyperglycemia: Secondary | ICD-10-CM | POA: Diagnosis not present

## 2017-03-01 DIAGNOSIS — M25579 Pain in unspecified ankle and joints of unspecified foot: Secondary | ICD-10-CM | POA: Diagnosis not present

## 2017-03-01 MED FILL — FLUOROURACIL 5% CREAM: 5 | 30 days supply | Qty: 40 | Fill #0

## 2017-03-01 MED FILL — DULoxetine HCL 20 MG CPEP: 20 | 30 days supply | Qty: 30 | Fill #0

## 2017-03-14 MED FILL — NovoLOG 100 UNIT/ML SOLN: 100 | 42 days supply | Qty: 20 | Fill #5

## 2017-03-14 MED FILL — CARVEDILOL 25 MG TABLET: 25 | 90 days supply | Qty: 180 | Fill #0

## 2017-03-20 DIAGNOSIS — R3914 Feeling of incomplete bladder emptying: Secondary | ICD-10-CM | POA: Diagnosis not present

## 2017-03-22 MED FILL — LOSARTAN POTASSIUM 50 MG TA: 50 | 90 days supply | Qty: 90 | Fill #0

## 2017-03-27 MED FILL — DULoxetine HCL 20 MG CPEP: 20 | 30 days supply | Qty: 30 | Fill #1

## 2017-04-04 DIAGNOSIS — E1065 Type 1 diabetes mellitus with hyperglycemia: Secondary | ICD-10-CM | POA: Diagnosis not present

## 2017-04-04 MED FILL — ULTCARE INS SYR 1 ML 31GX5/: 31G X 5/16" | 33 days supply | Qty: 200 | Fill #3

## 2017-04-10 DIAGNOSIS — F339 Major depressive disorder, recurrent, unspecified: Secondary | ICD-10-CM | POA: Diagnosis not present

## 2017-04-10 DIAGNOSIS — L570 Actinic keratosis: Secondary | ICD-10-CM | POA: Diagnosis not present

## 2017-04-16 DIAGNOSIS — M25572 Pain in left ankle and joints of left foot: Secondary | ICD-10-CM | POA: Diagnosis not present

## 2017-04-16 DIAGNOSIS — M12572 Traumatic arthropathy, left ankle and foot: Secondary | ICD-10-CM | POA: Diagnosis not present

## 2017-04-18 MED FILL — LANSOPRAZOLE DR 30 MG CAPSU: 30 | 90 days supply | Qty: 90 | Fill #2

## 2017-04-27 MED FILL — DULoxetine HCL 20 MG CPEP: 20 | 90 days supply | Qty: 90 | Fill #0

## 2017-05-02 DIAGNOSIS — E1065 Type 1 diabetes mellitus with hyperglycemia: Secondary | ICD-10-CM | POA: Diagnosis not present

## 2017-05-07 DIAGNOSIS — Z79891 Long term (current) use of opiate analgesic: Secondary | ICD-10-CM | POA: Diagnosis not present

## 2017-05-07 DIAGNOSIS — G894 Chronic pain syndrome: Secondary | ICD-10-CM | POA: Diagnosis not present

## 2017-05-07 MED FILL — HYDROmorphone HCL 4 MG TABS: 4 | 7 days supply | Qty: 21 | Fill #0

## 2017-05-10 MED FILL — NovoLOG 100 UNIT/ML SOLN: 100 | 42 days supply | Qty: 20 | Fill #6

## 2017-05-28 DIAGNOSIS — R3914 Feeling of incomplete bladder emptying: Secondary | ICD-10-CM | POA: Diagnosis not present

## 2017-06-04 DIAGNOSIS — E1065 Type 1 diabetes mellitus with hyperglycemia: Secondary | ICD-10-CM | POA: Diagnosis not present

## 2017-06-08 MED FILL — ULTCARE INS SYR 1 ML 31GX5/: 31G X 5/16" | 34 days supply | Qty: 200 | Fill #0

## 2017-06-12 MED FILL — HYDROmorphone HCL 4 MG TABS: 4 | 20 days supply | Qty: 60 | Fill #0

## 2017-06-21 MED FILL — LOSARTAN POTASSIUM 50 MG TA: 50 | 90 days supply | Qty: 90 | Fill #0

## 2017-06-21 MED FILL — NovoLOG 100 UNIT/ML SOLN: 100 | 42 days supply | Qty: 20 | Fill #0

## 2017-07-04 DIAGNOSIS — E1065 Type 1 diabetes mellitus with hyperglycemia: Secondary | ICD-10-CM | POA: Diagnosis not present

## 2017-07-10 DIAGNOSIS — M47816 Spondylosis without myelopathy or radiculopathy, lumbar region: Secondary | ICD-10-CM | POA: Diagnosis not present

## 2017-07-12 DIAGNOSIS — R809 Proteinuria, unspecified: Secondary | ICD-10-CM | POA: Diagnosis not present

## 2017-07-12 DIAGNOSIS — E1065 Type 1 diabetes mellitus with hyperglycemia: Secondary | ICD-10-CM | POA: Diagnosis not present

## 2017-07-12 DIAGNOSIS — R3914 Feeling of incomplete bladder emptying: Secondary | ICD-10-CM | POA: Diagnosis not present

## 2017-07-12 DIAGNOSIS — I10 Essential (primary) hypertension: Secondary | ICD-10-CM | POA: Diagnosis not present

## 2017-07-16 ENCOUNTER — Emergency Department (INDEPENDENT_AMBULATORY_CARE_PROVIDER_SITE_OTHER)
Admission: EM | Admit: 2017-07-16 | Discharge: 2017-07-16 | Disposition: A | Discharge status: Home / Self Care | Payer: PPO | Source: Home / Self Care | Attending: Family Medicine | Admitting: Family Medicine

## 2017-07-16 ENCOUNTER — Emergency Department (INDEPENDENT_AMBULATORY_CARE_PROVIDER_SITE_OTHER): Payer: PPO

## 2017-07-16 ENCOUNTER — Other Ambulatory Visit: Payer: Self-pay

## 2017-07-16 DIAGNOSIS — S8992XA Unspecified injury of left lower leg, initial encounter: Secondary | ICD-10-CM

## 2017-07-16 DIAGNOSIS — M79662 Pain in left lower leg: Secondary | ICD-10-CM | POA: Diagnosis not present

## 2017-07-16 DIAGNOSIS — S8012XA Contusion of left lower leg, initial encounter: Secondary | ICD-10-CM | POA: Diagnosis not present
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
## 2017-07-16 NOTE — Discharge Instructions (Signed)
°  Please keep the boot on while you are up and about.  You may remove the boot to bathe and to ice the hematoma.   Try to elevate your leg throughout the day to help reduce swelling and pain.  Try to remain non-weight bearing on your Left leg, per recommendation of Dr. Dianah Field, Sports Medicine.  Please call his office tomorrow to schedule an appointment for later this week.  He may order additional imaging.

## 2017-07-18 DIAGNOSIS — R55 Syncope and collapse: Secondary | ICD-10-CM | POA: Diagnosis not present

## 2017-07-20 MED FILL — HYDROmorphone HCL 4 MG TABS: 4 | 20 days supply | Qty: 60 | Fill #0

## 2017-07-23 MED FILL — DULoxetine HCL 20 MG CPEP: 20 | 90 days supply | Qty: 90 | Fill #1

## 2017-07-23 MED FILL — LANSOPRAZOLE DR 30 MG CAPSU: 30 | 90 days supply | Qty: 90 | Fill #3

## 2017-07-24 ENCOUNTER — Encounter: Payer: Self-pay | Admitting: Cardiology
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
## 2017-07-26 ENCOUNTER — Encounter: Payer: Self-pay | Admitting: Cardiology

## 2017-07-26 ENCOUNTER — Ambulatory Visit: Payer: PPO | Admitting: Cardiology

## 2017-07-26 VITALS — BP 128/82 | HR 66 | Ht 72.0 in | Wt 278.0 lb

## 2017-07-26 DIAGNOSIS — R55 Syncope and collapse: Secondary | ICD-10-CM

## 2017-07-26 DIAGNOSIS — E1165 Type 2 diabetes mellitus with hyperglycemia: Secondary | ICD-10-CM | POA: Diagnosis not present

## 2017-07-26 DIAGNOSIS — I10 Essential (primary) hypertension: Secondary | ICD-10-CM

## 2017-07-26 DIAGNOSIS — R079 Chest pain, unspecified: Secondary | ICD-10-CM | POA: Diagnosis not present

## 2017-07-26 NOTE — Patient Instructions (Signed)
Medication Instructions:  Continue current medications  If you need a refill on your cardiac medications before your next appointment, please call your pharmacy.  Labwork: None Ordered   Testing/Procedures: Your physician has requested that you have a carotid duplex. This test is an ultrasound of the carotid arteries in your neck. It looks at blood flow through these arteries that supply the brain with blood. Allow one hour for this exam. There are no restrictions or special instructions.  Your physician has recommended that you wear an event monitor. Event monitors are medical devices that record the heart's electrical activity. Doctors most often [place] these monitors to diagnose arrhythmias. Arrhythmias are problems with the speed or rhythm of the heartbeat. The monitor is a small, portable device. You can wear one while you do your normal daily activities. This is usually used to diagnose what is causing palpitations/syncope (passing out).   Follow-Up: Your physician wants you to follow-up in: 2 Months.      Thank you for choosing [facility] at [facility]!!

## 2017-07-27 ENCOUNTER — Other Ambulatory Visit: Payer: Self-pay | Admitting: Cardiology

## 2017-07-27 DIAGNOSIS — I6523 Occlusion and stenosis of bilateral carotid arteries: Secondary | ICD-10-CM

## 2017-07-27 DIAGNOSIS — M545 Low back pain: Secondary | ICD-10-CM | POA: Diagnosis not present

## 2017-07-27 DIAGNOSIS — Z79891 Long term (current) use of opiate analgesic: Secondary | ICD-10-CM | POA: Diagnosis not present

## 2017-07-27 DIAGNOSIS — M47816 Spondylosis without myelopathy or radiculopathy, lumbar region: Secondary | ICD-10-CM | POA: Diagnosis not present

## 2017-07-27 DIAGNOSIS — R55 Syncope and collapse: Secondary | ICD-10-CM

## 2017-07-27 DIAGNOSIS — G894 Chronic pain syndrome: Secondary | ICD-10-CM | POA: Diagnosis not present

## 2017-08-01 ENCOUNTER — Ambulatory Visit (HOSPITAL_COMMUNITY)
Admission: RE | Admit: 2017-08-01 | Discharge: 2017-08-01 | Disposition: A | Discharge status: Home / Self Care | Payer: PPO | Source: Ambulatory Visit | Attending: Cardiology | Admitting: Cardiology

## 2017-08-01 DIAGNOSIS — I10 Essential (primary) hypertension: Secondary | ICD-10-CM | POA: Insufficient documentation

## 2017-08-01 DIAGNOSIS — I6523 Occlusion and stenosis of bilateral carotid arteries: Secondary | ICD-10-CM | POA: Insufficient documentation

## 2017-08-01 DIAGNOSIS — R55 Syncope and collapse: Secondary | ICD-10-CM | POA: Diagnosis not present

## 2017-08-01 DIAGNOSIS — Z87891 Personal history of nicotine dependence: Secondary | ICD-10-CM | POA: Diagnosis not present

## 2017-08-01 DIAGNOSIS — E119 Type 2 diabetes mellitus without complications: Secondary | ICD-10-CM | POA: Insufficient documentation

## 2017-08-06 DIAGNOSIS — E1065 Type 1 diabetes mellitus with hyperglycemia: Secondary | ICD-10-CM | POA: Diagnosis not present

## 2017-08-07 DIAGNOSIS — R3914 Feeling of incomplete bladder emptying: Secondary | ICD-10-CM | POA: Diagnosis not present

## 2017-08-13 MED FILL — ULTCARE INS SYR 1 ML 31GX5/: 31G X 5/16" | 34 days supply | Qty: 200 | Fill #1

## 2017-08-14 MED FILL — AMOXICILLIN 500 MG CAPSULE: 500 | 8 days supply | Qty: 22 | Fill #0

## 2017-08-15 DIAGNOSIS — N401 Enlarged prostate with lower urinary tract symptoms: Secondary | ICD-10-CM | POA: Diagnosis not present

## 2017-08-15 DIAGNOSIS — R3914 Feeling of incomplete bladder emptying: Secondary | ICD-10-CM | POA: Diagnosis not present

## 2017-08-21 ENCOUNTER — Ambulatory Visit (INDEPENDENT_AMBULATORY_CARE_PROVIDER_SITE_OTHER): Payer: PPO

## 2017-08-21 DIAGNOSIS — R55 Syncope and collapse: Secondary | ICD-10-CM

## 2017-08-27 DIAGNOSIS — E119 Type 2 diabetes mellitus without complications: Secondary | ICD-10-CM | POA: Diagnosis not present

## 2017-08-28 MED FILL — NovoLOG 100 UNIT/ML SOLN: 100 | 42 days supply | Qty: 20 | Fill #1

## 2017-08-28 MED FILL — HYDROmorphone HCL 4 MG TABS: 4 | 20 days supply | Qty: 60 | Fill #0

## 2017-09-04 DIAGNOSIS — Z8507 Personal history of malignant neoplasm of pancreas: Secondary | ICD-10-CM | POA: Diagnosis not present

## 2017-09-04 DIAGNOSIS — L0390 Cellulitis, unspecified: Secondary | ICD-10-CM | POA: Diagnosis not present

## 2017-09-04 DIAGNOSIS — M25569 Pain in unspecified knee: Secondary | ICD-10-CM | POA: Diagnosis not present

## 2017-09-04 DIAGNOSIS — R1013 Epigastric pain: Secondary | ICD-10-CM | POA: Diagnosis not present

## 2017-09-04 MED FILL — DOXYCYCLINE HYCLATE 100 MG: 100 | 10 days supply | Qty: 20 | Fill #0

## 2017-09-04 MED FILL — ZENPEP 10000-32000 UNIT CPE: 10000-32000 | 30 days supply | Qty: 180 | Fill #0

## 2017-09-05 DIAGNOSIS — E1065 Type 1 diabetes mellitus with hyperglycemia: Secondary | ICD-10-CM | POA: Diagnosis not present

## 2017-09-11 ENCOUNTER — Other Ambulatory Visit: Payer: Self-pay | Admitting: Family Medicine

## 2017-09-11 DIAGNOSIS — Z471 Aftercare following joint replacement surgery: Secondary | ICD-10-CM | POA: Diagnosis not present

## 2017-09-11 DIAGNOSIS — M170 Bilateral primary osteoarthritis of knee: Secondary | ICD-10-CM | POA: Diagnosis not present

## 2017-09-11 DIAGNOSIS — Z96653 Presence of artificial knee joint, bilateral: Secondary | ICD-10-CM | POA: Diagnosis not present

## 2017-09-11 DIAGNOSIS — Z8507 Personal history of malignant neoplasm of pancreas: Secondary | ICD-10-CM

## 2017-09-14 DIAGNOSIS — R3914 Feeling of incomplete bladder emptying: Secondary | ICD-10-CM | POA: Diagnosis not present

## 2017-09-14 MED FILL — HYDROmorphone HCL 4 MG TABS: 4 | 20 days supply | Qty: 60 | Fill #0

## 2017-09-14 MED FILL — CARVEDILOL 25 MG TABLET: 25 | 90 days supply | Qty: 180 | Fill #0

## 2017-09-14 MED FILL — LOSARTAN POTASSIUM 50 MG TA: 50 | 90 days supply | Qty: 90 | Fill #1

## 2017-09-19 DIAGNOSIS — Z01812 Encounter for preprocedural laboratory examination: Secondary | ICD-10-CM | POA: Diagnosis not present

## 2017-09-22 ENCOUNTER — Encounter (HOSPITAL_BASED_OUTPATIENT_CLINIC_OR_DEPARTMENT_OTHER): Payer: Self-pay

## 2017-09-22 ENCOUNTER — Ambulatory Visit (HOSPITAL_BASED_OUTPATIENT_CLINIC_OR_DEPARTMENT_OTHER)
Admission: RE | Admit: 2017-09-22 | Discharge: 2017-09-22 | Disposition: A | Discharge status: Home / Self Care | Payer: PPO | Source: Ambulatory Visit | Attending: Family Medicine | Admitting: Family Medicine

## 2017-09-22 DIAGNOSIS — Z9081 Acquired absence of spleen: Secondary | ICD-10-CM | POA: Insufficient documentation

## 2017-09-22 DIAGNOSIS — Z90411 Acquired partial absence of pancreas: Secondary | ICD-10-CM | POA: Diagnosis not present

## 2017-09-22 DIAGNOSIS — Z8507 Personal history of malignant neoplasm of pancreas: Secondary | ICD-10-CM | POA: Diagnosis not present

## 2017-09-22 DIAGNOSIS — C251 Malignant neoplasm of body of pancreas: Secondary | ICD-10-CM | POA: Diagnosis not present

## 2017-09-22 MED ORDER — IOPAMIDOL (ISOVUE-300) INJECTION 61%
100.0000 mL | Freq: Once | INTRAVENOUS | Status: AC | PRN
  Administered 2017-09-22: 100 mL via INTRAVENOUS
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
## 2017-09-25 ENCOUNTER — Encounter: Payer: Self-pay | Admitting: Cardiology

## 2017-09-25 ENCOUNTER — Ambulatory Visit: Payer: PPO | Admitting: Cardiology

## 2017-09-25 VITALS — BP 113/69 | HR 77 | Ht 72.0 in | Wt 269.2 lb

## 2017-09-25 DIAGNOSIS — R55 Syncope and collapse: Secondary | ICD-10-CM | POA: Diagnosis not present

## 2017-09-25 NOTE — Patient Instructions (Addendum)
Medication Instructions:  ?Your physician recommends that you continue on your current medications as directed. Please refer to the Current Medication list given to you today.  ? ?Labwork: ?NONE ? ?Testing/Procedures: ?NONE ? ?Follow-Up: ?AS NEEDED  ? ?  ?

## 2017-10-04 DIAGNOSIS — M25361 Other instability, right knee: Secondary | ICD-10-CM | POA: Diagnosis not present

## 2017-10-04 DIAGNOSIS — M25561 Pain in right knee: Secondary | ICD-10-CM | POA: Diagnosis not present

## 2017-10-04 DIAGNOSIS — Z96653 Presence of artificial knee joint, bilateral: Secondary | ICD-10-CM | POA: Diagnosis not present

## 2017-10-04 DIAGNOSIS — M25551 Pain in right hip: Secondary | ICD-10-CM | POA: Diagnosis not present

## 2017-10-04 MED FILL — HYDROmorphone HCL 4 MG TABS: 4 | 20 days supply | Qty: 60 | Fill #0

## 2017-10-08 DIAGNOSIS — E1065 Type 1 diabetes mellitus with hyperglycemia: Secondary | ICD-10-CM | POA: Diagnosis not present

## 2017-10-19 MED FILL — DULoxetine HCL 20 MG CPEP: 20 | 90 days supply | Qty: 90 | Fill #2

## 2017-10-24 MED FILL — NovoLOG 100 UNIT/ML SOLN: 100 | 42 days supply | Qty: 20 | Fill #2

## 2017-10-24 MED FILL — LANSOPRAZOLE DR 30 MG CAPSU: 30 | 90 days supply | Qty: 90 | Fill #4

## 2017-10-26 DIAGNOSIS — R3914 Feeling of incomplete bladder emptying: Secondary | ICD-10-CM | POA: Diagnosis not present

## 2017-10-31 DIAGNOSIS — G894 Chronic pain syndrome: Secondary | ICD-10-CM | POA: Diagnosis not present

## 2017-10-31 DIAGNOSIS — Z79891 Long term (current) use of opiate analgesic: Secondary | ICD-10-CM | POA: Diagnosis not present

## 2017-10-31 DIAGNOSIS — M545 Low back pain: Secondary | ICD-10-CM | POA: Diagnosis not present

## 2017-10-31 DIAGNOSIS — M47816 Spondylosis without myelopathy or radiculopathy, lumbar region: Secondary | ICD-10-CM | POA: Diagnosis not present

## 2017-11-01 DIAGNOSIS — M25561 Pain in right knee: Secondary | ICD-10-CM | POA: Diagnosis not present

## 2017-11-12 MED FILL — ULTCARE INS SYR 1 ML 31GX5/: 31G X 5/16" | 34 days supply | Qty: 200 | Fill #2

## 2017-11-14 DIAGNOSIS — E1065 Type 1 diabetes mellitus with hyperglycemia: Secondary | ICD-10-CM | POA: Diagnosis not present

## 2017-11-19 MED FILL — HYDROmorphone HCL 4 MG TABS: 4 | 20 days supply | Qty: 60 | Fill #0

## 2017-11-20 DIAGNOSIS — Z96653 Presence of artificial knee joint, bilateral: Secondary | ICD-10-CM | POA: Diagnosis not present

## 2017-11-20 DIAGNOSIS — M1711 Unilateral primary osteoarthritis, right knee: Secondary | ICD-10-CM | POA: Diagnosis not present

## 2017-11-20 DIAGNOSIS — M170 Bilateral primary osteoarthritis of knee: Secondary | ICD-10-CM | POA: Diagnosis not present

## 2017-11-20 MED FILL — NITROFURANTOIN MONO-MCR 100: 100 | 30 days supply | Qty: 60 | Fill #0

## 2017-11-29 DIAGNOSIS — R3914 Feeling of incomplete bladder emptying: Secondary | ICD-10-CM | POA: Diagnosis not present

## 2017-12-06 DIAGNOSIS — M25561 Pain in right knee: Secondary | ICD-10-CM | POA: Diagnosis not present

## 2017-12-06 DIAGNOSIS — Z96659 Presence of unspecified artificial knee joint: Secondary | ICD-10-CM | POA: Diagnosis not present

## 2017-12-10 DIAGNOSIS — R8290 Unspecified abnormal findings in urine: Secondary | ICD-10-CM | POA: Diagnosis not present

## 2017-12-10 DIAGNOSIS — Z23 Encounter for immunization: Secondary | ICD-10-CM | POA: Diagnosis not present

## 2017-12-10 DIAGNOSIS — Z01818 Encounter for other preprocedural examination: Secondary | ICD-10-CM | POA: Diagnosis not present

## 2017-12-10 MED FILL — CIPROFLOXACIN HCL 500 MG TA: 500 | 10 days supply | Qty: 20 | Fill #0

## 2017-12-11 DIAGNOSIS — I10 Essential (primary) hypertension: Secondary | ICD-10-CM | POA: Diagnosis not present

## 2017-12-11 DIAGNOSIS — E1065 Type 1 diabetes mellitus with hyperglycemia: Secondary | ICD-10-CM | POA: Diagnosis not present

## 2017-12-11 DIAGNOSIS — R809 Proteinuria, unspecified: Secondary | ICD-10-CM | POA: Diagnosis not present

## 2017-12-14 DIAGNOSIS — M25561 Pain in right knee: Secondary | ICD-10-CM | POA: Diagnosis not present

## 2017-12-14 DIAGNOSIS — Z471 Aftercare following joint replacement surgery: Secondary | ICD-10-CM | POA: Diagnosis not present

## 2017-12-14 DIAGNOSIS — E1065 Type 1 diabetes mellitus with hyperglycemia: Secondary | ICD-10-CM | POA: Diagnosis not present

## 2017-12-14 DIAGNOSIS — Z96653 Presence of artificial knee joint, bilateral: Secondary | ICD-10-CM | POA: Diagnosis not present

## 2017-12-21 MED FILL — LOSARTAN POTASSIUM 50 MG TA: 50 | 90 days supply | Qty: 90 | Fill #2

## 2017-12-26 ENCOUNTER — Encounter (HOSPITAL_COMMUNITY): Payer: Self-pay | Admitting: *Deleted

## 2017-12-27 MED FILL — DULoxetine HCL 30 MG CPEP: 30 | 30 days supply | Qty: 30 | Fill #0

## 2017-12-31 MED FILL — ULTCARE INS SYR 1 ML 31GX5/: 31G X 5/16" | 34 days supply | Qty: 200 | Fill #3

## 2018-01-01 DIAGNOSIS — M25061 Hemarthrosis, right knee: Secondary | ICD-10-CM | POA: Diagnosis not present

## 2018-01-01 DIAGNOSIS — M25561 Pain in right knee: Secondary | ICD-10-CM | POA: Diagnosis not present

## 2018-01-01 DIAGNOSIS — T84022D Instability of internal right knee prosthesis, subsequent encounter: Secondary | ICD-10-CM | POA: Diagnosis not present

## 2018-01-01 MED FILL — CIPROFLOXACIN HCL 500 MG TA: 500 | 10 days supply | Qty: 20 | Fill #1

## 2018-01-02 DIAGNOSIS — R3914 Feeling of incomplete bladder emptying: Secondary | ICD-10-CM | POA: Diagnosis not present

## 2018-01-08 DIAGNOSIS — E1065 Type 1 diabetes mellitus with hyperglycemia: Secondary | ICD-10-CM | POA: Diagnosis not present

## 2018-01-08 DIAGNOSIS — E109 Type 1 diabetes mellitus without complications: Secondary | ICD-10-CM | POA: Diagnosis not present

## 2018-01-11 ENCOUNTER — Other Ambulatory Visit (HOSPITAL_COMMUNITY): Payer: Self-pay | Admitting: *Deleted

## 2018-01-11 NOTE — Patient Instructions (Addendum)
Derek [name]  01/11/2018   Your procedure is scheduled on: 01-23-18  Report to [facility] Main  Entrance  Report to admitting at  240 PM    Call this number if you have problems the morning of surgery [phone]   Remember: Do not eat food :After Midnight. CLEAR LIQUIDS FROM MIDNIGHT UNTIL 1100 AM DAY OF SURGERY, NOTHING BY MOUTH AFTER 1100 AM DAY OF SURGERY. BRUSH YOUR TEETH MORNING OF SURGERY AND RINSE YOUR MOUTH OUT, NO CHEWING GUM CANDY OR MINTS.     CLEAR LIQUID DIET   Foods Allowed                                                                     Foods Excluded  Coffee and tea, regular and decaf                             liquids that you cannot  Plain Jell-O in any flavor                                             see through such as: Fruit ices (not with fruit pulp)                                     milk, soups, orange juice  Iced Popsicles                                    All solid food Carbonated beverages, regular and diet                                    Cranberry, grape and apple juices Sports drinks like Gatorade Lightly seasoned clear broth or consume(fat free) Sugar, honey syrup  Sample Menu Breakfast                                Lunch                                     Supper Cranberry juice                    Beef broth                            Chicken broth Jell-O                                     Grape juice  Apple juice Coffee or tea                        Jell-O                                      Popsicle                                                Coffee or tea                        Coffee or tea  _____________________________________________________________________    How to Manage Your Diabetes Before and After Surgery  Why is it important to control my blood sugar before and after surgery? . Improving blood sugar levels before and after surgery helps healing and can limit  problems. . A way of improving blood sugar control is eating a healthy diet by: o  Eating less sugar and carbohydrates o  Increasing activity/exercise o  Talking with your doctor about reaching your blood sugar goals . High blood sugars (greater than 180 mg/dL) can raise your risk of infections and slow your recovery, so you will need to focus on controlling your diabetes during the weeks before surgery. . Make sure that the doctor who takes care of your diabetes knows about your planned surgery including the date and location.  How do I manage my blood sugar before surgery? . Check your blood sugar at least 4 times a day, starting 2 days before surgery, to make sure that the level is not too high or low. o Check your blood sugar the morning of your surgery when you wake up and every 2 hours until you get to the Short Stay unit. . If your blood sugar is less than 70 mg/dL, you will need to treat for low blood sugar: o Do not take insulin. o Treat a low blood sugar (less than 70 mg/dL) with  cup of clear juice (cranberry or apple), 4 glucose tablets, OR glucose gel. o Recheck blood sugar in 15 minutes after treatment (to make sure it is greater than 70 mg/dL). If your blood sugar is not greater than 70 mg/dL on recheck, call [phone] for further instructions. . Report your blood sugar to the short stay nurse when you get to Short Stay.  . If you are admitted to the hospital after surgery: o Your blood sugar will be checked by the staff and you will probably be given insulin after surgery (instead of oral diabetes medicines) to make sure you have good blood sugar levels. o The goal for blood sugar control after surgery is 80-180 mg/dL.   WHAT DO I DO ABOUT MY DIABETES MEDICATION?  Marland Kitchen Do not take oral diabetes medicines (pills) the morning of surgery.  . THE DAY  BEFORE SURGERY TAKE MORNING AND AFTERNOON DOSE OF NPH INSULIN AS USUAL, TAKE 80% OF NPH BEDTIME INSULIN DOSE (TAKE 40  UNITS)       . THE MORNING OF SURGERY TAKE 80 % USUAL DOSE OF NPH INSULIN (TAKE 32 UNITS)  . The day of surgery, do not take other diabetes injectables, including Byetta (exenatide), Bydureon (exenatide ER), Victoza (liraglutide), or Trulicity (dulaglutide).  . If your CBG  is greater than 220 mg/dL, you may take  of your sliding scale  . (correction) dose of insulin.     Patient Signature:  Date:   Nurse Signature:  Date:   Reviewed and Endorsed by [facility] Patient Education Committee, August 2015   Take these medicines the morning of surgery with A SIP OF WATER: HYDROMORPHONE (DILAUDID) IF NEEDED                                You may not have any metal on your body including hair pins and              piercings  Do not wear jewelry, make-up, lotions, powders or perfumes, deodorant             Do not wear nail polish.  Do not shave  48 hours prior to surgery.              Men may shave face and neck.   Do not bring valuables to the hospital. [place].  Contacts, dentures or bridgework may not be worn into surgery.  Leave suitcase in the car. After surgery it may be brought to your room.     __   [facility] - Preparing for Surgery Before surgery, you can play an important role.  Because skin is not sterile, your skin needs to be as free of germs as possible.  You can reduce the number of germs on your skin by washing with CHG (chlorahexidine gluconate) soap before surgery.  CHG is an antiseptic cleaner which kills germs and bonds with the skin to continue killing germs even after washing. Please DO NOT use if you have an allergy to CHG or antibacterial soaps.  If your skin becomes reddened/irritated stop using the CHG and inform your nurse when you arrive at Short Stay. Do not shave (including legs and underarms) for at least 48 hours prior to the first CHG shower.  You may shave your face/neck. Please follow these  instructions carefully:  1.  Shower with CHG Soap the night before surgery and the  morning of Surgery.  2.  If you choose to wash your hair, wash your hair first as usual with your  normal  shampoo.  3.  After you shampoo, rinse your hair and body thoroughly to remove the  shampoo.                           4.  Use CHG as you would any other liquid soap.  You can apply chg directly  to the skin and wash                       Gently with a scrungie or clean washcloth.  5.  Apply the CHG Soap to your body ONLY FROM THE NECK DOWN.   Do not use on face/ open                           Wound or open sores. Avoid contact with eyes, ears mouth and genitals (private parts).                       Wash face,  Genitals (private parts) with your normal soap.             6.  Wash thoroughly, paying special attention to the area where your surgery  will be performed.  7.  Thoroughly rinse your body with warm water from the neck down.  8.  DO NOT shower/wash with your normal soap after using and rinsing off  the CHG Soap.                9.  Pat yourself dry with a clean towel.            10.  Wear clean pajamas.            11.  Place clean sheets on your bed the night of your first shower and do not  sleep with pets. Day of Surgery : Do not apply any lotions/deodorants the morning of surgery.  Please wear clean clothes to the hospital/surgery center.   Incentive Spirometer  An incentive spirometer is a tool that can help keep your lungs clear and active. This tool measures how well you are filling your lungs with each breath. Taking long deep breaths may help reverse or decrease the chance of developing breathing (pulmonary) problems (especially infection) following:  A long period of time when you are unable to move or be active. BEFORE THE PROCEDURE   If the spirometer includes an indicator to show your best effort, your nurse or respiratory therapist will set it to a desired goal.  If possible, sit up  straight or lean slightly forward. Try not to slouch.  Hold the incentive spirometer in an upright position. INSTRUCTIONS FOR USE  1. Sit on the edge of your bed if possible, or sit up as far as you can in bed or on a chair. 2. Hold the incentive spirometer in an upright position. 3. Breathe out normally. 4. Place the mouthpiece in your mouth and seal your lips tightly around it. 5. Breathe in slowly and as deeply as possible, raising the piston or the ball toward the top of the column. 6. Hold your breath for 3-5 seconds or for as long as possible. Allow the piston or ball to fall to the bottom of the column. 7. Remove the mouthpiece from your mouth and breathe out normally. 8. Rest for a few seconds and repeat Steps 1 through 7 at least 10 times every 1-2 hours when you are awake. Take your time and take a few normal breaths between deep breaths. 9. The spirometer may include an indicator to show your best effort. Use the indicator as a goal to work toward during each repetition. 10. After each set of 10 deep breaths, practice coughing to be sure your lungs are clear. If you have an incision (the cut made at the time of surgery), support your incision when coughing by placing a pillow or rolled up towels firmly against it. Once you are able to get out of bed, walk around indoors and cough well. You may stop using the incentive spirometer when instructed by your caregiver.  RISKS AND COMPLICATIONS  Take your time so you do not get dizzy or light-headed.  If you are in pain, you may need to take or ask for pain medication before doing incentive spirometry. It is harder to take a deep breath if you are having pain. AFTER USE  Rest and breathe slowly and easily.  It can be helpful to keep track of a log of your progress. Your  caregiver can provide you with a simple table to help with this. If you are using the spirometer at home, follow these instructions: [place] IF:   You are  having difficultly using the spirometer.  You have trouble using the spirometer as often as instructed.  Your pain medication is not giving enough relief while using the spirometer.  You develop fever of 100.5 F (38.1 C) or higher. SEEK IMMEDIATE MEDICAL CARE IF:   You cough up bloody sputum that had not been present before.  You develop fever of 102 F (38.9 C) or greater.  You develop worsening pain at or near the incision site. MAKE SURE YOU:   Understand these instructions.  Will watch your condition.  Will get help right away if you are not doing well or get worse. Document Released: 06/19/2006 Document Revised: 05/01/2011 Document Reviewed: 08/20/2006 ExitCare Patient Information 2014 Exit[employer], [place].   ________________________________________________________________________  WHAT IS A BLOOD TRANSFUSION? Blood Transfusion Information  A transfusion is the replacement of blood or some of its parts. Blood is made up of multiple cells which provide different functions.  Red blood cells carry oxygen and are used for blood loss replacement.  White blood cells fight against infection.  Platelets control bleeding.  Plasma helps clot blood.  Other blood products are available for specialized needs, such as hemophilia or other clotting disorders. BEFORE THE TRANSFUSION  Who gives blood for transfusions?   Healthy volunteers who are fully evaluated to make sure their blood is safe. This is blood bank blood. Transfusion therapy is the safest it has ever been in the practice of medicine. Before blood is taken from a donor, a complete history is taken to make sure that person has no history of diseases nor engages in risky social behavior (examples are intravenous drug use or sexual activity with multiple partners). The donor's travel history is screened to minimize risk of transmitting infections, such as malaria. The donated blood is tested for signs of infectious diseases,  such as HIV and hepatitis. The blood is then tested to be sure it is compatible with you in order to minimize the chance of a transfusion reaction. If you or a relative donates blood, this is often done in anticipation of surgery and is not appropriate for emergency situations. It takes many days to process the donated blood. RISKS AND COMPLICATIONS Although transfusion therapy is very safe and saves many lives, the main dangers of transfusion include:   Getting an infectious disease.  Developing a transfusion reaction. This is an allergic reaction to something in the blood you were given. Every precaution is taken to prevent this. The decision to have a blood transfusion has been considered carefully by your caregiver before blood is given. Blood is not given unless the benefits outweigh the risks. AFTER THE TRANSFUSION  Right after receiving a blood transfusion, you will usually feel much better and more energetic. This is especially true if your red blood cells have gotten low (anemic). The transfusion raises the level of the red blood cells which carry oxygen, and this usually causes an energy increase.  The nurse administering the transfusion will monitor you carefully for complications. HOME CARE INSTRUCTIONS  No special instructions are needed after a transfusion. You may find your energy is better. Speak with your caregiver about any limitations on activity for underlying diseases you may have. SEEK MEDICAL CARE IF:   Your condition is not improving after your transfusion.  You develop redness or irritation at  the intravenous (IV) site. SEEK IMMEDIATE MEDICAL CARE IF:  Any of the following symptoms occur over the next 12 hours:  Shaking chills.  You have a temperature by mouth above 102 F (38.9 C), not controlled by medicine.  Chest, back, or muscle pain.  People around you feel you are not acting correctly or are confused.  Shortness of breath or difficulty  breathing.  Dizziness and fainting.  You get a rash or develop hives.  You have a decrease in urine output.  Your urine turns a dark color or changes to pink, red, or brown. Any of the following symptoms occur over the next 10 days:  You have a temperature by mouth above 102 F (38.9 C), not controlled by medicine.  Shortness of breath.  Weakness after normal activity.  The white part of the eye turns yellow (jaundice).  You have a decrease in the amount of urine or are urinating less often.  Your urine turns a dark color or changes to pink, red, or brown. Document Released: 02/04/2000 Document Revised: 05/01/2011 Document Reviewed: 09/23/2007 [facility] Patient Information 2014 [place], [place].  _______________________________________________________________________

## 2018-01-11 NOTE — Progress Notes (Signed)
hemaglobin a1c results from 01-08-18 faxed to dr Wynelle Link and left message with kelly hancock to make dr Wynelle Link aware of hemaglobin a1c results.

## 2018-01-11 NOTE — Progress Notes (Signed)
MEDICAL CLEARANCE NOTE DR Juanda Crumble ROSS 12-10-17 ON CHART FOR 01-23-18 SURGERY LOV DR [place] 09-25-17 Epic CAROTID DOPPLER AND HOLTER MONITOR RESULTS MENTIONED IN NOTE (PATIENT TO FOLLOW UP WITH DR HOCHREIN PRN PER NOTE) EKG 07-26-17 EPIC

## 2018-01-14 ENCOUNTER — Encounter (HOSPITAL_COMMUNITY): Payer: Self-pay

## 2018-01-14 ENCOUNTER — Other Ambulatory Visit: Payer: Self-pay

## 2018-01-14 ENCOUNTER — Encounter (HOSPITAL_COMMUNITY)
Admission: RE | Admit: 2018-01-14 | Discharge: 2018-01-14 | Disposition: A | Discharge status: Home / Self Care | Payer: PPO | Source: Ambulatory Visit | Attending: Orthopedic Surgery | Admitting: Orthopedic Surgery

## 2018-01-14 DIAGNOSIS — X58XXXA Exposure to other specified factors, initial encounter: Secondary | ICD-10-CM | POA: Diagnosis not present

## 2018-01-14 DIAGNOSIS — T84022A Instability of internal right knee prosthesis, initial encounter: Secondary | ICD-10-CM | POA: Diagnosis not present

## 2018-01-14 DIAGNOSIS — Z01812 Encounter for preprocedural laboratory examination: Secondary | ICD-10-CM | POA: Insufficient documentation

## 2018-01-14 LAB — CBC
HCT: 42.6 % (ref 39.0–52.0)
Hemoglobin: 13.4 g/dL (ref 13.0–17.0)
MCH: 28.3 pg (ref 26.0–34.0)
MCHC: 31.5 g/dL (ref 30.0–36.0)
MCV: 89.9 fL (ref 80.0–100.0)
Platelets: 445 10*3/uL — ABNORMAL HIGH (ref 150–400)
RBC: 4.74 MIL/uL (ref 4.22–5.81)
RDW: 14.4 % (ref 11.5–15.5)
WBC: 9 10*3/uL (ref 4.0–10.5)
nRBC: 0 % (ref 0.0–0.2)

## 2018-01-14 LAB — SURGICAL PCR SCREEN
MRSA, PCR: NEGATIVE
Staphylococcus aureus: NEGATIVE

## 2018-01-14 LAB — COMPREHENSIVE METABOLIC PANEL
ALT: 27 U/L (ref 0–44)
AST: 22 U/L (ref 15–41)
Albumin: 3.7 g/dL (ref 3.5–5.0)
Alkaline Phosphatase: 86 U/L (ref 38–126)
Anion gap: 6 (ref 5–15)
BUN: 24 mg/dL — ABNORMAL HIGH (ref 8–23)
CO2: 24 mmol/L (ref 22–32)
Calcium: 10.5 mg/dL — ABNORMAL HIGH (ref 8.9–10.3)
Chloride: 107 mmol/L (ref 98–111)
Creatinine, Ser: 1.15 mg/dL (ref 0.61–1.24)
GFR calc Af Amer: >60 mL/min (ref >60)
GFR calc non Af Amer: >60 mL/min (ref >60)
Glucose, Bld: 189 mg/dL — ABNORMAL HIGH (ref 70–99)
Potassium: 5.2 mmol/L — ABNORMAL HIGH (ref 3.5–5.1)
Sodium: 137 mmol/L (ref 135–145)
Total Bilirubin: 0.7 mg/dL (ref 0.3–1.2)
Total Protein: 7.3 g/dL (ref 6.5–8.1)

## 2018-01-14 LAB — GLUCOSE, CAPILLARY: Glucose-Capillary: 190 mg/dL — ABNORMAL HIGH (ref 70–99)

## 2018-01-14 LAB — PROTIME-INR
INR: 1.04
Prothrombin Time: 13.5 seconds (ref 11.4–15.2)

## 2018-01-14 LAB — APTT: aPTT: 32 seconds (ref 24–36)

## 2018-01-14 MED FILL — NovoLOG 100 UNIT/ML SOLN: 100 | 42 days supply | Qty: 20 | Fill #3

## 2018-01-16 MED FILL — ONETOUCH VERIO TEST STRIP: 68 days supply | Qty: 200 | Fill #0

## 2018-01-18 DIAGNOSIS — E1065 Type 1 diabetes mellitus with hyperglycemia: Secondary | ICD-10-CM | POA: Diagnosis not present

## 2018-01-19 NOTE — H&P (Signed)
TOTAL KNEE REVISION ADMISSION H&P  Patient is being admitted for right knee polyethylene versus TKA revision  Subjective:  Chief Complaint:right knee pain with recurrent effusion.  HPI: Derek [name], 72 y.o. male, has a history of pain and functional disability in the right knee(s) due to unstable total knee arthroplasty and patient has failed non-surgical conservative treatments for greater than 12 weeks to include activity modification. The indications for the revision of the total knee arthroplasty are instability, pain, and recurrent hemarthrosis. Onset of symptoms was abrupt beginning after patient fell over the summer with stable course since that time.  Prior procedures on the right knee(s) include arthroplasty.  Patient currently rates pain in the right knee(s) at 7 out of 10 with activity. There is worsening of pain with activity and weight bearing, joint swelling and instability.  Patient has no evidence of prosthesis loosening by imaging studies. This condition presents safety issues increasing the risk of falls. There is no current active infection.  Patient Active Problem List   Diagnosis Date Noted  . Foreign body of abdominal wall 09/04/2016  . Ventral incisional hernia 11/12/2015  . Incisional hernia 11/10/2015  . 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 Pleural effusion on left   . Lactose intolerance in adult 04/04/2015  . Protein-calorie malnutrition, moderate ([place]) 04/04/2015  . Pleural effusion, left   . Debility   . Benign essential HTN   . Leukocytosis   . Thrombocytosis ([place])   . Intra-abdominal abscess ([place])   . Poorly controlled type 2 diabetes mellitus ([place])   . 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 OA (osteoarthritis) of knee 06/02/2013  . Anal fissure 02/04/2013  . Meniere's disease   . GERD (gastroesophageal reflux disease)   . Kidney stone   . Obstructive sleep apnea   . Morbid obesity  ([place])   . Synovitis of knee 03/01/2012  . Chronic cough 11/03/2011  . COSTOCHONDRITIS, LEFT 10/08/2009  . RIB PAIN, LEFT SIDED 10/08/2009   Past Medical History:  Diagnosis Date  . Anxiety   . Arthritis   . Bilateral leg cramps    takes magnesium  . BPH (benign prostatic hyperplasia)   . DDD (degenerative disc disease), lumbar    gets back injections   . Deafness in right ear    meniere's disease  . Depression   . Dermatitis    bilateral hands  . Factor V Leiden mutation ([place])    "never had blood clots"  . GERD (gastroesophageal reflux disease)   . Hiatal hernia   . History of kidney stones    multiple kidney stones-not a problem at present  . 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 History of panic attacks   . 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 Hypertension   . Incomplete right bundle branch block   . Insulin dependent type 2 diabetes mellitus, uncontrolled ([place]) followed by dr [name] ([place])   dx 2007--- ltype 1 now since jan 2017 surgery with part of pancreas removed  . Lower urinary tract symptoms (LUTS)   . Meniere's disease dx 1970s   intermittant vertigo  . Obstructive sleep apnea    " i used to have sleep apnea" never used a c-pap   . Open wound of  abdomen    WET/DRY DRESSING CHANGES TID--- POST ABD. SURGERY 09-21-2016 healed now  . Peripheral vascular disease ([facility])    right leg - decreased pulse   . Wears partial dentures    LOWER  . 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 ANAL FISTULECTOMY N/A 04/01/2013   Procedure: EXAM UNDER ANESTHESIA WITH ANAL FISSURectomy, sphincterotomy and internal hemorrhoidectomy;  Surgeon: Harl Bowie, MD;  Location: [place];  Service: General;   Laterality: N/A;  . APPLICATION OF WOUND VAC N/A 05/16/2015   Procedure: APPLICATION OF WOUND VAC;  Surgeon: Armandina Gemma, MD;  Location: [facility];  Service: General;  Laterality: N/A;  . APPLICATION OF WOUND VAC N/A 05/20/2015   Procedure: EXHANGE OF ABDOMINAL  WOUND VAC DRESSING;  Surgeon: Armandina Gemma, MD;  Location: [facility];  Service: General;  Laterality: N/A;  . COLONOSCOPY    . CYSTOSCOPY WITH INSERTION OF UROLIFT N/A 10/12/2016   Procedure: CYSTOSCOPY WITH INSERTION OF UROLIFT;  Surgeon: Franchot Gallo, MD;  Location[facility];  Service: Urology;  Laterality: N/A;  . EUS N/A 12/31/2014   Procedure: UPPER ENDOSCOPIC ULTRASOUND (EUS) LINEAR;  Surgeon: Milus Banister, MD;  Location: [facility];  Service: Endoscopy;  Laterality: N/A;  . HARDWARE REMOVAL Left 2013   left ankle  . HEMORRHOIDECTOMY WITH HEMORRHOID BANDING    . INCISION AND DRAINAGE ABSCESS N/A 05/14/2015   Procedure: DRAINAGE OF INFECTED PANCREATIC PSEUDOCYST;  Surgeon: Armandina Gemma, MD;  Location: [facility];  Service: General;  Laterality: N/A;  . INCISION AND DRAINAGE OF WOUND N/A 05/16/2015   Procedure: IRRIGATION AND DEBRIDEMENT WOUND;  Surgeon: Armandina Gemma, MD;  Location: [facility];  Service: General;  Laterality: N/A;  . INCISIONAL HERNIA REPAIR N/A 11/12/2015   Procedure: [place];  Surgeon: Armandina Gemma, MD;  Location: [place];  Service: General;  Laterality: N/A;  . INGUINAL HERNIA REPAIR Right 1974;  1982  . INSERTION OF MESH N/A 11/12/2015   Procedure: INSERTION OF MESH;  Surgeon: Armandina Gemma, MD;  Location: [place];  Service: General;  Laterality: N/A;  . IR GENERIC HISTORICAL  04/20/2015   IR RADIOLOGIST EVAL & MGMT 04/20/2015 [facility] INTERV RAD  . KNEE ARTHROSCOPY Bilateral right 08-08-2000;  left 11-23-2000   meniscal repair and chondroplasty's  . KNEE ARTHROSCOPY  03/01/2012   Procedure: ARTHROSCOPY KNEE;  Surgeon: Gearlean Alf, MD;  Location[facility];  Service:  Orthopedics;  Laterality: Left;  WITH SYNOVECTOMY   . LAPAROTOMY N/A 05/14/2015   Procedure: EXPLORATORY LAPAROTOMY;  Surgeon: Armandina Gemma, MD;  [facility];  Service: General;  Laterality: N/A;  . ORIF LEFT ANKLE FX  08/20/2009   distal tib-fib and malleolus  . ROTATOR CUFF REPAIR Bilateral right 1994; left 1993, left 1993  . TOTAL KNEE ARTHROPLASTY Right 06/02/2013   Procedure: RIGHT TOTAL KNEE ARTHROPLASTY;  Surgeon: Gearlean Alf, MD;  Location[facility];  Service: Orthopedics;  Laterality: Right;  . TOTAL KNEE ARTHROPLASTY Left 01-31-2008   dr Wynelle Link  . UMBILICAL HERNIA REPAIR  08-11-1999   dr Ninfa Linden  . UPPER GASTROINTESTINAL ENDOSCOPY  sept 2016  . WOUND EXPLORATION N/A 09/21/2016   Procedure: WOUND EXPLORATION AND DEBRIDEMENT ABDOMINAL WALL;  Surgeon: Armandina Gemma, MD;  Location: [facility];  Service: General;  Laterality: N/A;    No current facility-administered medications for this encounter.    Current Outpatient Medications  Medication Sig Dispense Refill Last Dose  . aspirin EC 81 MG tablet Take 81 mg by mouth at bedtime.   Taking  . carvedilol (COREG) 25 MG tablet Take 0.5 tablets (12.5 mg total) by mouth 2 (two) times daily with a meal. (Patient taking differently: Take 25 mg by mouth every evening. ) 60 tablet 1 Taking  . ciprofloxacin (CIPRO) 500 MG tablet Take 500 mg by mouth daily as needed (for UTI).     . clobetasol ointment (TEMOVATE) 4.54 % Apply 1 application topically daily as needed (for peeling skin on hands and feet). Reported on 03/17/2015   Taking  . DULoxetine (CYMBALTA) 30 MG capsule Take 30 mg by mouth at bedtime.   3 Taking  . HYDROmorphone (DILAUDID) 4 MG tablet Take 4 mg by mouth 3 (three) times daily as needed for moderate pain.    Taking  . insulin aspart (NOVOLOG) 100 UNIT/ML injection Inject 0-9 Units into the skin 3 (three) times daily before meals. (Patient taking differently: Inject 5-30 Units into the skin 3 (three) times daily before meals. Per  sliding scale) 10 mL 11 Taking  . insulin NPH Human (HUMULIN N,NOVOLIN N) 100 UNIT/ML injection Inject 25-50 Units into the skin See admin instructions. Inject 50 units SQ in the morning, inject 25 units SQ at lunch and inject 40 units SQ at bedtime     . lansoprazole (PREVACID) 30 MG capsule Take 30 mg by mouth daily as needed (for acid reflux).    Taking  . losartan (COZAAR) 50 MG tablet Take 50 mg by mouth at bedtime.    Taking  . magnesium oxide (MAG-OX) 400 MG tablet Take 800 mg by mouth at bedtime.     . nitroGLYCERIN (NITROSTAT) 0.4 MG SL tablet Place 0.4 mg under the tongue every 5 (five) minutes as needed for chest pain.     . Probiotic Product (PROBIOTIC PO) Take 1 capsule by mouth at bedtime.   Taking  . vitamin C (ASCORBIC ACID) 500 MG tablet Take 500 mg by mouth at bedtime.    Taking   Allergies  Allergen Reactions  . Oxycontin [Oxycodone Hcl] Swelling and Other (See Comments)    Lips swells. Tolerates immediate-release oxycodone as well as hydrocodone.  . Quinine Other (See Comments)    Platelets dropped  . Voltaren [Diclofenac Sodium] Other (See Comments)    Elevated liver enzymes  . Doxycycline Other (See Comments)    Severe nose bleeds  . Morphine And Related Nausea And Vomiting    Social History   Tobacco Use  . Smoking status: Former Smoker    Packs/day: 4.00    Years: 17.00    Pack years: 68.00    Types: Cigarettes    Last attempt to quit: 02/21/1975    Years since quitting: 42.9  . Smokeless tobacco: Never Used  Substance Use Topics  . Alcohol use: No    Alcohol/week: 0.0 standard drinks    Family History  Problem Relation Age of Onset  . Bone cancer Father        Jaw  . Factor V Leiden deficiency Daughter   . Diabetes Maternal Grandmother   . Heart disease Paternal Uncle   . Colon cancer Neg Hx   . Esophageal cancer Neg Hx   . Stomach cancer Neg Hx   . Rectal cancer Neg Hx       Review of Systems  Constitutional: Negative for chills and fever.    HENT: Negative for congestion, sore throat and  tinnitus.   Eyes: Negative for double vision, photophobia and pain.  Respiratory: Negative for cough, shortness of breath and wheezing.   Cardiovascular: Negative for chest pain, palpitations and orthopnea.  Gastrointestinal: Negative for heartburn, nausea and vomiting.  Genitourinary: Negative for dysuria, frequency and urgency.  Musculoskeletal: Positive for joint pain.  Neurological: Negative for dizziness, weakness and headaches.     Objective:  Physical Exam  Well nourished and well developed.  General: Alert and oriented x3, cooperative and pleasant, no acute distress.  Head: normocephalic, atraumatic, neck supple.  Eyes: EOMI.  Respiratory: breath sounds clear in all fields, no wheezing, rales, or rhonchi. Cardiovascular: Regular rate and rhythm, no murmurs, gallops or rubs.  Abdomen: non-tender to palpation and soft, normoactive bowel sounds. Musculoskeletal: Right Knee Exam: Large effusion. No Swelling. Range of motion is 2-3 of hyperextension to - 120 degrees. No crepitus on range of motion of the knee. No medial joint line tenderness. No lateral joint line tenderness. A significant amount of A/P laxity. Moderate varus/valgus laxity. Knee was stable prior to his injury.  Calves soft and nontender. Motor function intact in LE. Strength 5/5 LE bilaterally. Neuro: Distal pulses 2+. Sensation to light touch intact in LE.  Vital signs in last 24 hours:  Blood pressure: 126/78 mmHg Pulse: 60 bpm   Labs:  Estimated body mass index is 36.35 kg/m as calculated from the following:   Height as of 01/14/18: 6' (1.829 m).   Weight as of 01/14/18: 121.6 kg.  Imaging Review Plain radiographs do not demonstrate any loosening of the femoral or tibial prostheses. Overall alignment is neutral. Bone quality appears to be adequate for age and reported activity level.   Preoperative templating of the joint replacement has been completed,  documented, and submitted to the Operating Room personnel in order to optimize intra-operative equipment management.   Assessment/Plan:  End stage arthritis, right knee(s) with unstable previous arthroplasty.   The patient history, physical examination, clinical judgment of the provider and imaging studies are consistent with unstable right total knee arthroplasty of the right knee(s), previous total knee arthroplasty. Hopefully this can be managed with a polyethylene revision, however if there is any evidence of loosening during the operation we will need to convert to a total knee arthroplasty revision, which patient understands and is agreeable with. The risks and benefits of revision total knee arthroplasty were presented and reviewed. The risks due to aseptic loosening, infection, stiffness, patella tracking problems, thromboembolic complications and other imponderables were discussed. The patient acknowledged the explanation, agreed to proceed with the plan and consent was signed. Patient is being admitted for inpatient treatment for surgery, pain control, PT, OT, prophylactic antibiotics, VTE prophylaxis, progressive ambulation and ADL's and discharge planning.The patient is planning to be discharged home.    Therapy Plans: outpatient therapy at [facility] (if polyethylene revision, will plan on HEP if passes therapy). Disposition: Home with wife Planned DVT Prophylaxis: Xarelto 10 mg daily (hx pancreatic CA and factor IV) DME needed: None PCP: Dr. Lona Kettle TXA: IV Allergies: Hydrocodone (facial swelling), quinine Anesthesia Concerns: None BMI: 34.4 Last HgbA1c: 8.3%  - Patient was instructed on what medications to stop prior to surgery. - Follow-up visit in 2 weeks with Dr. Wynelle Link - Begin physical therapy following surgery - Pre-operative lab work as pre-surgical testing - Prescriptions will be provided in hospital at time of discharge  Theresa Duty,  PA-C Orthopedic Surgery [facility] [place]

## 2018-01-21 MED FILL — HYDROmorphone HCL 4 MG TABS: 4 | 20 days supply | Qty: 60 | Fill #0

## 2018-01-22 MED ORDER — BUPIVACAINE LIPOSOME 1.3 % IJ SUSP
20.0000 mL | Freq: Once | INTRAMUSCULAR | Status: DC
  Filled 2018-01-22: qty 20

## 2018-01-22 MED ORDER — DEXTROSE 5 % IV SOLN
3.0000 g | INTRAVENOUS | Status: AC
  Administered 2018-01-23: 3 g via INTRAVENOUS
  Filled 2018-01-22: qty 3

## 2018-01-23 ENCOUNTER — Inpatient Hospital Stay (HOSPITAL_COMMUNITY): Payer: PPO | Admitting: Certified Registered Nurse Anesthetist

## 2018-01-23 ENCOUNTER — Other Ambulatory Visit: Payer: Self-pay

## 2018-01-23 ENCOUNTER — Encounter (HOSPITAL_COMMUNITY): Payer: Self-pay | Admitting: General Practice

## 2018-01-23 ENCOUNTER — Inpatient Hospital Stay (HOSPITAL_COMMUNITY)
Admission: RE | Admit: 2018-01-23 | Discharge: 2018-01-24 | DRG: 488 | Disposition: A | Discharge status: Home / Self Care | Payer: PPO | Attending: Orthopedic Surgery | Admitting: Orthopedic Surgery

## 2018-01-23 ENCOUNTER — Encounter (HOSPITAL_COMMUNITY)
Admission: RE | Disposition: A | Discharge status: Home / Self Care | Payer: Self-pay | Source: Home / Self Care | Attending: Orthopedic Surgery

## 2018-01-23 DIAGNOSIS — Z794 Long term (current) use of insulin: Secondary | ICD-10-CM

## 2018-01-23 DIAGNOSIS — Z8507 Personal history of malignant neoplasm of pancreas: Secondary | ICD-10-CM

## 2018-01-23 DIAGNOSIS — Z888 Allergy status to other drugs, medicaments and biological substances status: Secondary | ICD-10-CM

## 2018-01-23 DIAGNOSIS — H9191 Unspecified hearing loss, right ear: Secondary | ICD-10-CM | POA: Diagnosis not present

## 2018-01-23 DIAGNOSIS — E739 Lactose intolerance, unspecified: Secondary | ICD-10-CM | POA: Diagnosis not present

## 2018-01-23 DIAGNOSIS — Z87442 Personal history of urinary calculi: Secondary | ICD-10-CM

## 2018-01-23 DIAGNOSIS — H8109 Meniere's disease, unspecified ear: Secondary | ICD-10-CM | POA: Diagnosis present

## 2018-01-23 DIAGNOSIS — M1711 Unilateral primary osteoarthritis, right knee: Secondary | ICD-10-CM | POA: Diagnosis not present

## 2018-01-23 DIAGNOSIS — K219 Gastro-esophageal reflux disease without esophagitis: Secondary | ICD-10-CM | POA: Diagnosis not present

## 2018-01-23 DIAGNOSIS — Z8249 Family history of ischemic heart disease and other diseases of the circulatory system: Secondary | ICD-10-CM

## 2018-01-23 DIAGNOSIS — Z96659 Presence of unspecified artificial knee joint: Secondary | ICD-10-CM

## 2018-01-23 DIAGNOSIS — Y792 Prosthetic and other implants, materials and accessory orthopedic devices associated with adverse incidents: Secondary | ICD-10-CM | POA: Diagnosis present

## 2018-01-23 DIAGNOSIS — M65861 Other synovitis and tenosynovitis, right lower leg: Secondary | ICD-10-CM | POA: Diagnosis not present

## 2018-01-23 DIAGNOSIS — F419 Anxiety disorder, unspecified: Secondary | ICD-10-CM | POA: Diagnosis not present

## 2018-01-23 DIAGNOSIS — G8918 Other acute postprocedural pain: Secondary | ICD-10-CM | POA: Diagnosis not present

## 2018-01-23 DIAGNOSIS — I10 Essential (primary) hypertension: Secondary | ICD-10-CM | POA: Diagnosis not present

## 2018-01-23 DIAGNOSIS — E1151 Type 2 diabetes mellitus with diabetic peripheral angiopathy without gangrene: Secondary | ICD-10-CM | POA: Diagnosis present

## 2018-01-23 DIAGNOSIS — M25061 Hemarthrosis, right knee: Secondary | ICD-10-CM | POA: Diagnosis present

## 2018-01-23 DIAGNOSIS — I4519 Other right bundle-branch block: Secondary | ICD-10-CM | POA: Diagnosis present

## 2018-01-23 DIAGNOSIS — D6851 Activated protein C resistance: Secondary | ICD-10-CM | POA: Diagnosis not present

## 2018-01-23 DIAGNOSIS — Z885 Allergy status to narcotic agent status: Secondary | ICD-10-CM

## 2018-01-23 DIAGNOSIS — T84022A Instability of internal right knee prosthesis, initial encounter: Principal | ICD-10-CM | POA: Diagnosis present

## 2018-01-23 DIAGNOSIS — Z87891 Personal history of nicotine dependence: Secondary | ICD-10-CM

## 2018-01-23 DIAGNOSIS — Z881 Allergy status to other antibiotic agents status: Secondary | ICD-10-CM

## 2018-01-23 DIAGNOSIS — Z9081 Acquired absence of spleen: Secondary | ICD-10-CM

## 2018-01-23 DIAGNOSIS — G4733 Obstructive sleep apnea (adult) (pediatric): Secondary | ICD-10-CM | POA: Diagnosis present

## 2018-01-23 DIAGNOSIS — Z972 Presence of dental prosthetic device (complete) (partial): Secondary | ICD-10-CM | POA: Diagnosis not present

## 2018-01-23 DIAGNOSIS — Z79899 Other long term (current) drug therapy: Secondary | ICD-10-CM

## 2018-01-23 DIAGNOSIS — Z7982 Long term (current) use of aspirin: Secondary | ICD-10-CM

## 2018-01-23 DIAGNOSIS — Z87820 Personal history of traumatic brain injury: Secondary | ICD-10-CM

## 2018-01-23 DIAGNOSIS — F329 Major depressive disorder, single episode, unspecified: Secondary | ICD-10-CM | POA: Diagnosis not present

## 2018-01-23 DIAGNOSIS — N400 Enlarged prostate without lower urinary tract symptoms: Secondary | ICD-10-CM | POA: Diagnosis present

## 2018-01-23 DIAGNOSIS — Z833 Family history of diabetes mellitus: Secondary | ICD-10-CM

## 2018-01-23 DIAGNOSIS — Z90411 Acquired partial absence of pancreas: Secondary | ICD-10-CM | POA: Diagnosis not present

## 2018-01-23 DIAGNOSIS — T84018A Broken internal joint prosthesis, other site, initial encounter: Secondary | ICD-10-CM

## 2018-01-23 HISTORY — PX: TOTAL KNEE REVISION: SHX996

## 2018-01-23 LAB — GLUCOSE, CAPILLARY
Glucose-Capillary: 136 mg/dL — ABNORMAL HIGH (ref 70–99)
Glucose-Capillary: 147 mg/dL — ABNORMAL HIGH (ref 70–99)
Glucose-Capillary: 131 mg/dL — ABNORMAL HIGH (ref 70–99)
Glucose-Capillary: 279 mg/dL — ABNORMAL HIGH (ref 70–99)

## 2018-01-23 LAB — TYPE AND SCREEN
ABO/RH(D): O POS
Antibody Screen: NEGATIVE

## 2018-01-23 SURGERY — TOTAL KNEE REVISION
Anesthesia: Spinal | Site: Knee | Laterality: Right

## 2018-01-23 MED ORDER — BUPIVACAINE IN DEXTROSE 0.75-8.25 % IT SOLN
INTRATHECAL | Status: DC | PRN
  Administered 2018-01-23: 1.6 mL via INTRATHECAL

## 2018-01-23 MED ORDER — BUPIVACAINE LIPOSOME 1.3 % IJ SUSP
INTRAMUSCULAR | Status: DC | PRN
  Administered 2018-01-23: 20 mL

## 2018-01-23 MED ORDER — POLYETHYLENE GLYCOL 3350 17 G PO PACK: 17.0000 g | PACK | Freq: Every day | ORAL | Status: DC | PRN

## 2018-01-23 MED ORDER — SODIUM CHLORIDE (PF) 0.9 % IJ SOLN
INTRAMUSCULAR | Status: AC
  Filled 2018-01-23: qty 50

## 2018-01-23 MED ORDER — HYDROMORPHONE HCL 2 MG PO TABS
2.0000 mg | ORAL_TABLET | ORAL | Status: DC | PRN
  Filled 2018-01-23 (×2): qty 1

## 2018-01-23 MED ORDER — MAGNESIUM OXIDE 400 (241.3 MG) MG PO TABS
800.0000 mg | ORAL_TABLET | Freq: Every day | ORAL | Status: DC
  Administered 2018-01-23: 800 mg via ORAL
  Filled 2018-01-23: qty 2

## 2018-01-23 MED ORDER — DEXAMETHASONE SODIUM PHOSPHATE 10 MG/ML IJ SOLN
8.0000 mg | Freq: Once | INTRAMUSCULAR | Status: AC
  Administered 2018-01-23: 8 mg via INTRAVENOUS

## 2018-01-23 MED ORDER — CHLORHEXIDINE GLUCONATE 4 % EX LIQD: 60.0000 mL | Freq: Once | CUTANEOUS | Status: DC

## 2018-01-23 MED ORDER — ROPIVACAINE HCL 5 MG/ML IJ SOLN
INTRAMUSCULAR | Status: DC | PRN
  Administered 2018-01-23 (×2): 5 mL via PERINEURAL

## 2018-01-23 MED ORDER — PROMETHAZINE HCL 25 MG/ML IJ SOLN: 6.2500 mg | INTRAMUSCULAR | Status: DC | PRN

## 2018-01-23 MED ORDER — MIDAZOLAM HCL 2 MG/2ML IJ SOLN
1.0000 mg | INTRAMUSCULAR | Status: AC
  Administered 2018-01-23 (×2): 1 mg via INTRAVENOUS
  Filled 2018-01-23: qty 2

## 2018-01-23 MED ORDER — INSULIN NPH (HUMAN) (ISOPHANE) 100 UNIT/ML ~~LOC~~ SUSP: 25.0000 [IU] | Freq: Every day | SUBCUTANEOUS | Status: DC

## 2018-01-23 MED ORDER — 0.9 % SODIUM CHLORIDE (POUR BTL) OPTIME
TOPICAL | Status: DC | PRN
  Administered 2018-01-23: 1000 mL

## 2018-01-23 MED ORDER — HYDROMORPHONE HCL 1 MG/ML IJ SOLN
0.5000 mg | INTRAMUSCULAR | Status: DC | PRN
  Administered 2018-01-24 (×2): 1 mg via INTRAVENOUS
  Filled 2018-01-23 (×2): qty 1

## 2018-01-23 MED ORDER — METOCLOPRAMIDE HCL 5 MG/ML IJ SOLN: 5.0000 mg | Freq: Three times a day (TID) | INTRAMUSCULAR | Status: DC | PRN

## 2018-01-23 MED ORDER — SODIUM CHLORIDE (PF) 0.9 % IJ SOLN
INTRAMUSCULAR | Status: AC
  Filled 2018-01-23: qty 10

## 2018-01-23 MED ORDER — ROPIVACAINE HCL 7.5 MG/ML IJ SOLN
INTRAMUSCULAR | Status: DC | PRN
  Administered 2018-01-23 (×4): 5 mL via PERINEURAL

## 2018-01-23 MED ORDER — FENTANYL CITRATE (PF) 100 MCG/2ML IJ SOLN: 25.0000 ug | INTRAMUSCULAR | Status: DC | PRN

## 2018-01-23 MED ORDER — DEXAMETHASONE SODIUM PHOSPHATE 10 MG/ML IJ SOLN
10.0000 mg | Freq: Once | INTRAMUSCULAR | Status: AC
  Administered 2018-01-24: 10 mg via INTRAVENOUS
  Filled 2018-01-23: qty 1

## 2018-01-23 MED ORDER — DIPHENHYDRAMINE HCL 12.5 MG/5ML PO ELIX: 12.5000 mg | ORAL_SOLUTION | ORAL | Status: DC | PRN

## 2018-01-23 MED ORDER — PROPOFOL 10 MG/ML IV BOLUS
INTRAVENOUS | Status: DC | PRN
  Administered 2018-01-23: 10 mg via INTRAVENOUS

## 2018-01-23 MED ORDER — MEPERIDINE HCL 50 MG/ML IJ SOLN: 6.2500 mg | INTRAMUSCULAR | Status: DC | PRN

## 2018-01-23 MED ORDER — TRANEXAMIC ACID-NACL 1000-0.7 MG/100ML-% IV SOLN
1000.0000 mg | INTRAVENOUS | Status: AC
  Administered 2018-01-23: 1000 mg via INTRAVENOUS
  Filled 2018-01-23: qty 100

## 2018-01-23 MED ORDER — ACETAMINOPHEN 10 MG/ML IV SOLN: 1000.0000 mg | Freq: Once | INTRAVENOUS | Status: DC | PRN

## 2018-01-23 MED ORDER — METHOCARBAMOL 500 MG IVPB - SIMPLE MED
INTRAVENOUS | Status: AC
  Filled 2018-01-23: qty 50

## 2018-01-23 MED ORDER — ONDANSETRON HCL 4 MG/2ML IJ SOLN
INTRAMUSCULAR | Status: DC | PRN
  Administered 2018-01-23: 4 mg via INTRAVENOUS

## 2018-01-23 MED ORDER — CEFAZOLIN SODIUM-DEXTROSE 2-4 GM/100ML-% IV SOLN
2.0000 g | Freq: Four times a day (QID) | INTRAVENOUS | Status: AC
  Administered 2018-01-23 – 2018-01-24 (×2): 2 g via INTRAVENOUS
  Filled 2018-01-23 (×2): qty 100

## 2018-01-23 MED ORDER — ONDANSETRON HCL 4 MG/2ML IJ SOLN: 4.0000 mg | Freq: Four times a day (QID) | INTRAMUSCULAR | Status: DC | PRN

## 2018-01-23 MED ORDER — METOCLOPRAMIDE HCL 5 MG PO TABS: 5.0000 mg | ORAL_TABLET | Freq: Three times a day (TID) | ORAL | Status: DC | PRN

## 2018-01-23 MED ORDER — INSULIN ASPART 100 UNIT/ML ~~LOC~~ SOLN
0.0000 [IU] | Freq: Three times a day (TID) | SUBCUTANEOUS | Status: DC
  Administered 2018-01-24: 5 [IU] via SUBCUTANEOUS

## 2018-01-23 MED ORDER — LOSARTAN POTASSIUM 50 MG PO TABS: 50.0000 mg | ORAL_TABLET | Freq: Every day | ORAL | Status: DC

## 2018-01-23 MED ORDER — CARVEDILOL 25 MG PO TABS: 25.0000 mg | ORAL_TABLET | Freq: Every evening | ORAL | Status: DC

## 2018-01-23 MED ORDER — LACTATED RINGERS IV SOLN
INTRAVENOUS | Status: DC
  Administered 2018-01-23 (×2): via INTRAVENOUS

## 2018-01-23 MED ORDER — INSULIN NPH (HUMAN) (ISOPHANE) 100 UNIT/ML ~~LOC~~ SUSP
40.0000 [IU] | Freq: Every day | SUBCUTANEOUS | Status: DC
  Administered 2018-01-23: 40 [IU] via SUBCUTANEOUS
  Filled 2018-01-23: qty 10

## 2018-01-23 MED ORDER — ONDANSETRON HCL 4 MG PO TABS: 4.0000 mg | ORAL_TABLET | Freq: Four times a day (QID) | ORAL | Status: DC | PRN

## 2018-01-23 MED ORDER — HYDROMORPHONE HCL 2 MG PO TABS
4.0000 mg | ORAL_TABLET | ORAL | Status: DC | PRN
  Administered 2018-01-23 – 2018-01-24 (×3): 4 mg via ORAL
  Filled 2018-01-23 (×2): qty 2

## 2018-01-23 MED ORDER — FENTANYL CITRATE (PF) 100 MCG/2ML IJ SOLN
50.0000 ug | INTRAMUSCULAR | Status: AC
  Administered 2018-01-23 (×2): 50 ug via INTRAVENOUS
  Filled 2018-01-23: qty 2

## 2018-01-23 MED ORDER — SODIUM CHLORIDE 0.9 % IV SOLN
INTRAVENOUS | Status: DC
  Administered 2018-01-23: 20:00:00 via INTRAVENOUS

## 2018-01-23 MED ORDER — RIVAROXABAN 10 MG PO TABS
10.0000 mg | ORAL_TABLET | Freq: Every day | ORAL | Status: DC
  Administered 2018-01-24: 10 mg via ORAL
  Filled 2018-01-23: qty 1

## 2018-01-23 MED ORDER — DOCUSATE SODIUM 100 MG PO CAPS
100.0000 mg | ORAL_CAPSULE | Freq: Two times a day (BID) | ORAL | Status: DC
  Administered 2018-01-23 – 2018-01-24 (×2): 100 mg via ORAL
  Filled 2018-01-23 (×2): qty 1

## 2018-01-23 MED ORDER — INSULIN NPH (HUMAN) (ISOPHANE) 100 UNIT/ML ~~LOC~~ SUSP
50.0000 [IU] | Freq: Every day | SUBCUTANEOUS | Status: DC
  Administered 2018-01-24: 50 [IU] via SUBCUTANEOUS
  Filled 2018-01-23: qty 10

## 2018-01-23 MED ORDER — INSULIN NPH (HUMAN) (ISOPHANE) 100 UNIT/ML ~~LOC~~ SUSP: 25.0000 [IU] | SUBCUTANEOUS | Status: DC

## 2018-01-23 MED ORDER — ONDANSETRON HCL 4 MG/2ML IJ SOLN
INTRAMUSCULAR | Status: AC
  Filled 2018-01-23: qty 2

## 2018-01-23 MED ORDER — ACETAMINOPHEN 10 MG/ML IV SOLN
1000.0000 mg | Freq: Four times a day (QID) | INTRAVENOUS | Status: DC
  Administered 2018-01-23: 1000 mg via INTRAVENOUS
  Filled 2018-01-23: qty 100

## 2018-01-23 MED ORDER — PANTOPRAZOLE SODIUM 40 MG PO TBEC: 40.0000 mg | DELAYED_RELEASE_TABLET | Freq: Every day | ORAL | Status: DC | PRN

## 2018-01-23 MED ORDER — PROPOFOL 10 MG/ML IV BOLUS
INTRAVENOUS | Status: AC
  Filled 2018-01-23: qty 80

## 2018-01-23 MED ORDER — DULOXETINE HCL 30 MG PO CPEP: 30.0000 mg | ORAL_CAPSULE | Freq: Every day | ORAL | Status: DC

## 2018-01-23 MED ORDER — BISACODYL 10 MG RE SUPP: 10.0000 mg | Freq: Every day | RECTAL | Status: DC | PRN

## 2018-01-23 MED ORDER — SODIUM CHLORIDE 0.9 % IV SOLN
INTRAVENOUS | Status: DC | PRN
  Administered 2018-01-23: 50 ug/min via INTRAVENOUS

## 2018-01-23 MED ORDER — EPHEDRINE SULFATE-NACL 50-0.9 MG/10ML-% IV SOSY
PREFILLED_SYRINGE | INTRAVENOUS | Status: DC | PRN
  Administered 2018-01-23: 10 mg via INTRAVENOUS

## 2018-01-23 MED ORDER — PHENYLEPHRINE 40 MCG/ML (10ML) SYRINGE FOR IV PUSH (FOR BLOOD PRESSURE SUPPORT)
PREFILLED_SYRINGE | INTRAVENOUS | Status: DC | PRN
  Administered 2018-01-23 (×5): 80 ug via INTRAVENOUS

## 2018-01-23 MED ORDER — METHOCARBAMOL 500 MG PO TABS
500.0000 mg | ORAL_TABLET | Freq: Four times a day (QID) | ORAL | Status: DC | PRN
  Administered 2018-01-24: 500 mg via ORAL
  Filled 2018-01-23: qty 1

## 2018-01-23 MED ORDER — GABAPENTIN 300 MG PO CAPS
300.0000 mg | ORAL_CAPSULE | Freq: Once | ORAL | Status: AC
  Administered 2018-01-23: 300 mg via ORAL
  Filled 2018-01-23: qty 1

## 2018-01-23 MED ORDER — METHOCARBAMOL 500 MG IVPB - SIMPLE MED
500.0000 mg | Freq: Four times a day (QID) | INTRAVENOUS | Status: DC | PRN
  Administered 2018-01-23: 500 mg via INTRAVENOUS
  Filled 2018-01-23: qty 50

## 2018-01-23 MED ORDER — PROPOFOL 500 MG/50ML IV EMUL
INTRAVENOUS | Status: DC | PRN
  Administered 2018-01-23: 50 ug/kg/min via INTRAVENOUS

## 2018-01-23 MED ORDER — SODIUM CHLORIDE (PF) 0.9 % IJ SOLN
INTRAMUSCULAR | Status: DC | PRN
  Administered 2018-01-23: 60 mL

## 2018-01-23 MED ORDER — DEXAMETHASONE SODIUM PHOSPHATE 10 MG/ML IJ SOLN
INTRAMUSCULAR | Status: AC
  Filled 2018-01-23: qty 1

## 2018-01-23 MED ORDER — ACETAMINOPHEN 500 MG PO TABS
1000.0000 mg | ORAL_TABLET | Freq: Four times a day (QID) | ORAL | Status: DC
  Administered 2018-01-24 (×2): 1000 mg via ORAL
  Filled 2018-01-23 (×2): qty 2

## 2018-01-23 MED ORDER — FLEET ENEMA 7-19 GM/118ML RE ENEM: 1.0000 | ENEMA | Freq: Once | RECTAL | Status: DC | PRN

## 2018-01-23 MED ORDER — NITROGLYCERIN 0.4 MG SL SUBL: 0.4000 mg | SUBLINGUAL_TABLET | SUBLINGUAL | Status: DC | PRN

## 2018-01-23 MED FILL — DULoxetine HCL 30 MG CPEP: 30 | 30 days supply | Qty: 30 | Fill #1

## 2018-01-23 MED FILL — LANSOPRAZOLE DR 30 MG CAPSU: 30 | 90 days supply | Qty: 90 | Fill #0

## 2018-01-23 SURGICAL SUPPLY — 54 items
BAG DECANTER FOR FLEXI CONT (MISCELLANEOUS) ×2 IMPLANT
BAG SPEC THK2 15X12 ZIP CLS (MISCELLANEOUS)
BAG ZIPLOCK 12X15 (MISCELLANEOUS) IMPLANT
BANDAGE ACE 6X5 VEL STRL LF (GAUZE/BANDAGES/DRESSINGS) ×2 IMPLANT
BANDAGE ELASTIC 6 VELCRO ST LF (GAUZE/BANDAGES/DRESSINGS) ×1 IMPLANT
BLADE SAG 18X100X1.27 (BLADE) ×2 IMPLANT
BLADE SAW SGTL 11.0X1.19X90.0M (BLADE) ×2 IMPLANT
CLOTH BEACON ORANGE TIMEOUT ST (SAFETY) ×2 IMPLANT
COVER SURGICAL LIGHT HANDLE (MISCELLANEOUS) ×2 IMPLANT
COVER WAND RF STERILE (DRAPES) IMPLANT
CUFF TOURN SGL QUICK 34 (TOURNIQUET CUFF) ×2
CUFF TRNQT CYL 34X4X40X1 (TOURNIQUET CUFF) ×1 IMPLANT
DECANTER SPIKE VIAL GLASS SM (MISCELLANEOUS) IMPLANT
DRAPE U-SHAPE 47X51 STRL (DRAPES) ×2 IMPLANT
DRSG ADAPTIC 3X8 NADH LF (GAUZE/BANDAGES/DRESSINGS) ×2 IMPLANT
DRSG PAD ABDOMINAL 8X10 ST (GAUZE/BANDAGES/DRESSINGS) ×2 IMPLANT
DURAPREP 26ML APPLICATOR (WOUND CARE) ×2 IMPLANT
ELECT REM PT RETURN 15FT ADLT (MISCELLANEOUS) ×2 IMPLANT
EVACUATOR 1/8 PVC DRAIN (DRAIN) ×2 IMPLANT
GAUZE SPONGE 4X4 12PLY STRL (GAUZE/BANDAGES/DRESSINGS) ×2 IMPLANT
GLOVE BIO SURGEON STRL SZ7 (GLOVE) ×2 IMPLANT
GLOVE BIO SURGEON STRL SZ8 (GLOVE) ×2 IMPLANT
GLOVE BIOGEL PI IND STRL 7.0 (GLOVE) ×1 IMPLANT
GLOVE BIOGEL PI IND STRL 8 (GLOVE) ×1 IMPLANT
GLOVE BIOGEL PI INDICATOR 7.0 (GLOVE) ×1
GLOVE BIOGEL PI INDICATOR 8 (GLOVE) ×1
GOWN STRL REUS W/TWL LRG LVL3 (GOWN DISPOSABLE) ×2 IMPLANT
GOWN STRL REUS W/TWL XL LVL3 (GOWN DISPOSABLE) ×2 IMPLANT
HANDPIECE INTERPULSE COAX TIP (DISPOSABLE) ×2
HOLDER FOLEY CATH W/STRAP (MISCELLANEOUS) IMPLANT
IMMOBILIZER KNEE 20 (SOFTGOODS) ×2
IMMOBILIZER KNEE 20 THIGH 36 (SOFTGOODS) ×1 IMPLANT
MANIFOLD NEPTUNE II (INSTRUMENTS) ×2 IMPLANT
NS IRRIG 1000ML POUR BTL (IV SOLUTION) ×2 IMPLANT
PACK TOTAL KNEE CUSTOM (KITS) ×2 IMPLANT
PAD ABD 8X10 STRL (GAUZE/BANDAGES/DRESSINGS) ×1 IMPLANT
PADDING CAST COTTON 6X4 STRL (CAST SUPPLIES) ×4 IMPLANT
PLATE ROT INSERT 17.5MM SIZE5 (Plate) ×1 IMPLANT
PROTECTOR NERVE ULNAR (MISCELLANEOUS) ×2 IMPLANT
SET HNDPC FAN SPRY TIP SCT (DISPOSABLE) ×1 IMPLANT
STRIP CLOSURE SKIN 1/2X4 (GAUZE/BANDAGES/DRESSINGS) ×2 IMPLANT
SUT MNCRL AB 4-0 PS2 18 (SUTURE) ×2 IMPLANT
SUT STRATAFIX 0 PDS 27 VIOLET (SUTURE) ×2
SUT VIC AB 2-0 CT1 27 (SUTURE) ×6
SUT VIC AB 2-0 CT1 TAPERPNT 27 (SUTURE) ×3 IMPLANT
SUTURE STRATFX 0 PDS 27 VIOLET (SUTURE) ×1 IMPLANT
SWAB COLLECTION DEVICE MRSA (MISCELLANEOUS) IMPLANT
SWAB CULTURE ESWAB REG 1ML (MISCELLANEOUS) IMPLANT
SYR 50ML LL SCALE MARK (SYRINGE) ×4 IMPLANT
TOWER CARTRIDGE SMART MIX (DISPOSABLE) ×2 IMPLANT
TRAY FOLEY MTR SLVR 16FR STAT (SET/KITS/TRAYS/PACK) ×2 IMPLANT
TUBE KAMVAC SUCTION (TUBING) IMPLANT
WATER STERILE IRR 1000ML POUR (IV SOLUTION) ×2 IMPLANT
WRAP KNEE MAXI GEL POST OP (GAUZE/BANDAGES/DRESSINGS) IMPLANT

## 2018-01-23 NOTE — Anesthesia Preprocedure Evaluation (Signed)
Anesthesia Evaluation  Patient identified by MRN, date of birth, ID band Patient awake    Reviewed: Allergy & Precautions, NPO status , Patient's Chart, lab work & pertinent test results, reviewed documented beta blocker date and time   History of Anesthesia Complications Negative for: history of anesthetic complications  Airway Mallampati: II  TM Distance: >3 FB Neck ROM: Full    Dental  (+) Partial Lower, Dental Advisory Given   Pulmonary neg shortness of breath, sleep apnea , neg COPD, neg recent URI, former smoker,  Welder's lung   Pulmonary exam normal breath sounds clear to auscultation       Cardiovascular hypertension, Pt. on medications and Pt. on home beta blockers (-) angina(-) Past MI, (-) Cardiac Stents and (-) Orthopnea Normal cardiovascular examDysrhythmias: incomplete RBBB.  Rhythm:Regular Rate:Normal  NSR. Rate 65. LAD, Incomplete, RBBB   Neuro/Psych neg Seizures PSYCHIATRIC DISORDERS Anxiety Depression Lumbar DDD, Meniere's disease    GI/Hepatic Neg liver ROS, hiatal hernia, GERD  Medicated and Controlled,  Endo/Other  diabetes, Poorly Controlled, Type 2, Insulin DependentMorbid obesity  Renal/GU Renal InsufficiencyRenal disease (h/o nephrolithiasis)     Musculoskeletal  (+) Arthritis , Osteoarthritis,    Abdominal (+) + obese,   Peds  Hematology  (+) Blood dyscrasia (Factor V Leiden, no h/o blood clots), ,   Anesthesia Other Findings Pancreatic cancer, hearing loss right ear  Reproductive/Obstetrics                             Anesthesia Physical  Anesthesia Plan  ASA: III  Anesthesia Plan: Spinal   Post-op Pain Management:  [facility] for Post-op pain   Induction: Intravenous  PONV Risk Score and Plan: 1 and Ondansetron and Dexamethasone  Airway Management Planned: Natural Airway, Nasal Cannula and Simple Face Mask  Additional Equipment: None  Intra-op Plan:    Post-operative Plan:   Informed Consent: I have reviewed the patients History and Physical, chart, labs and discussed the procedure including the risks, benefits and alternatives for the proposed anesthesia with the patient or authorized representative who has indicated his/her understanding and acceptance.     Plan Discussed with: CRNA and Surgeon  Anesthesia Plan Comments:         Anesthesia Quick Evaluation

## 2018-01-23 NOTE — Anesthesia Postprocedure Evaluation (Signed)
Anesthesia Post Note  Patient: Derek [name]  Procedure(s) Performed: right knee polyethylene revision (Right Knee)     Patient location during evaluation: PACU Anesthesia Type: Spinal Level of consciousness: awake and sedated Pain management: pain level controlled Vital Signs Assessment: post-procedure vital signs reviewed and stable Respiratory status: spontaneous breathing Cardiovascular status: stable Postop Assessment: no headache, no backache, spinal receding and no apparent nausea or vomiting Anesthetic complications: no    Last Vitals:  Vitals:   01/23/18 1815 01/23/18 1830  BP: (!) 147/105 136/81  Pulse: (!) 52 (!) 58  Resp: 15 14  Temp:  36.5 C  SpO2: 94% 99%    Last Pain:  Vitals:   01/23/18 1830  PainSc: 0-No pain   Pain Goal:                 Huston Foley

## 2018-01-23 NOTE — Plan of Care (Signed)

## 2018-01-23 NOTE — Discharge Instructions (Addendum)
° °Dr. Frank Aluisio °Total Joint Specialist °[facility] °[address] °[place],  [address] °[phone] ° °TOTAL KNEE REPLACEMENT POSTOPERATIVE DIRECTIONS ° °Knee Rehabilitation, Guidelines Following Surgery  °Results after knee surgery are often greatly improved when you follow the exercise, range of motion and muscle strengthening exercises prescribed by your doctor. Safety measures are also important to protect the knee from further injury. Any time any of these exercises cause you to have increased pain or swelling in your knee joint, decrease the amount until you are comfortable again and slowly increase them. If you have problems or questions, call your caregiver or physical therapist for advice.  ° °HOME CARE INSTRUCTIONS  °Remove items at home which could result in a fall. This includes throw rugs or furniture in walking pathways.  °· ICE to the affected knee every three hours for 30 minutes at a time and then as needed for pain and swelling.  Continue to use ice on the knee for pain and swelling from surgery. You may notice swelling that will progress down to the foot and ankle.  This is normal after surgery.  Elevate the leg when you are not up walking on it.   °· Continue to use the breathing machine which will help keep your temperature down.  It is common for your temperature to cycle up and down following surgery, especially at night when you are not up moving around and exerting yourself.  The breathing machine keeps your lungs expanded and your temperature down. °· Do not place pillow under knee, focus on keeping the knee straight while resting ° °DIET °You may resume your previous home diet once your are discharged from the hospital. ° °DRESSING / WOUND CARE / SHOWERING °You may shower 3 days after surgery, but keep the wounds dry during showering.  You may use an occlusive plastic wrap (Press'n Seal for example), NO SOAKING/SUBMERGING IN THE BATHTUB.  If the bandage gets  wet, change with a clean dry gauze.  If the incision gets wet, pat the wound dry with a clean towel. °You may start showering once you are discharged home but do not submerge the incision under water. Just pat the incision dry and apply a dry gauze dressing on daily. °Change the surgical dressing daily and reapply a dry dressing each time. ° °ACTIVITY °Walk with your walker as instructed. °Use walker as long as suggested by your caregivers. °Avoid periods of inactivity such as sitting longer than an hour when not asleep. This helps prevent blood clots.  °You may resume a sexual relationship in one month or when given the OK by your doctor.  °You may return to work once you are cleared by your doctor.  °Do not drive a car for 6 weeks or until released by you surgeon.  °Do not drive while taking narcotics. ° °WEIGHT BEARING °Weight bearing as tolerated with assist device (walker, cane, etc) as directed, use it as long as suggested by your surgeon or therapist, typically at least 4-6 weeks. ° °POSTOPERATIVE CONSTIPATION PROTOCOL °Constipation - defined medically as fewer than three stools per week and severe constipation as less than one stool per week. ° °One of the most common issues patients have following surgery is constipation.  Even if you have a regular bowel pattern at home, your normal regimen is likely to be disrupted due to multiple reasons following surgery.  Combination of anesthesia, postoperative narcotics, change in appetite and fluid intake all can affect your bowels.    In order to avoid complications following surgery, here are some recommendations in order to help you during your recovery period. ° °Colace (docusate) - Pick up an over-the-counter form of Colace or another stool softener and take twice a day as long as you are requiring postoperative pain medications.  Take with a full glass of water daily.  If you experience loose stools or diarrhea, hold the colace until you stool forms back up.  If  your symptoms do not get better within 1 week or if they get worse, check with your doctor. ° °Dulcolax (bisacodyl) - Pick up over-the-counter and take as directed by the product packaging as needed to assist with the movement of your bowels.  Take with a full glass of water.  Use this product as needed if not relieved by Colace only.  ° °MiraLax (polyethylene glycol) - Pick up over-the-counter to have on hand.  MiraLax is a solution that will increase the amount of water in your bowels to assist with bowel movements.  Take as directed and can mix with a glass of water, juice, soda, coffee, or tea.  Take if you go more than two days without a movement. °Do not use MiraLax more than once per day. Call your doctor if you are still constipated or irregular after using this medication for 7 days in a row. ° °If you continue to have problems with postoperative constipation, please contact the office for further assistance and recommendations.  If you experience "the worst abdominal pain ever" or develop nausea or vomiting, please contact the office immediatly for further recommendations for treatment. ° °ITCHING ° If you experience itching with your medications, try taking only a single pain pill, or even half a pain pill at a time.  You can also use Benadryl over the counter for itching or also to help with sleep.  ° °TED HOSE STOCKINGS °Wear the elastic stockings on both legs for three weeks following surgery during the day but you may remove then at night for sleeping. ° °MEDICATIONS °See your medication summary on the “After Visit Summary” that the nursing staff will review with you prior to discharge.  You may have some home medications which will be placed on hold until you complete the course of blood thinner medication.  It is important for you to complete the blood thinner medication as prescribed by your surgeon.  Continue your approved medications as instructed at time of discharge. ° °PRECAUTIONS °If you  experience chest pain or shortness of breath - call 911 immediately for transfer to the hospital emergency department.  °If you develop a fever greater that 101 F, purulent drainage from wound, increased redness or drainage from wound, foul odor from the wound/dressing, or calf pain - CONTACT YOUR SURGEON.   °                                                °FOLLOW-UP APPOINTMENTS °Make sure you keep all of your appointments after your operation with your surgeon and caregivers. You should call the office at the above phone number and make an appointment for approximately two weeks after the date of your surgery or on the date instructed by your surgeon outlined in the "After Visit Summary". ° ° °RANGE OF MOTION AND STRENGTHENING EXERCISES  °Rehabilitation of the knee is important following a knee injury or   an operation. After just a few days of immobilization, the muscles of the thigh which control the knee become weakened and shrink (atrophy). Knee exercises are designed to build up the tone and strength of the thigh muscles and to improve knee motion. Often times heat used for twenty to thirty minutes before working out will loosen up your tissues and help with improving the range of motion but do not use heat for the first two weeks following surgery. These exercises can be done on a training (exercise) mat, on the floor, on a table or on a bed. Use what ever works the best and is most comfortable for you Knee exercises include:  °Leg Lifts - While your knee is still immobilized in a splint or cast, you can do straight leg raises. Lift the leg to 60 degrees, hold for 3 sec, and slowly lower the leg. Repeat 10-20 times 2-3 times daily. Perform this exercise against resistance later as your knee gets better.  °Quad and Hamstring Sets - Tighten up the muscle on the front of the thigh (Quad) and hold for 5-10 sec. Repeat this 10-20 times hourly. Hamstring sets are done by pushing the foot backward against an object  and holding for 5-10 sec. Repeat as with quad sets.  °· Leg Slides: Lying on your back, slowly slide your foot toward your buttocks, bending your knee up off the floor (only go as far as is comfortable). Then slowly slide your foot back down until your leg is flat on the floor again. °· Angel Wings: Lying on your back spread your legs to the side as far apart as you can without causing discomfort.  °A rehabilitation program following serious knee injuries can speed recovery and prevent re-injury in the future due to weakened muscles. Contact your doctor or a physical therapist for more information on knee rehabilitation.  ° °IF YOU ARE TRANSFERRED TO A SKILLED REHAB FACILITY °If the patient is transferred to a skilled rehab facility following release from the hospital, a list of the current medications will be sent to the facility for the patient to continue.  When discharged from the skilled rehab facility, please have the facility set up the patient's Home Health Physical Therapy prior to being released. Also, the skilled facility will be responsible for providing the patient with their medications at time of release from the facility to include their pain medication, the muscle relaxants, and their blood thinner medication. If the patient is still at the rehab facility at time of the two week follow up appointment, the skilled rehab facility will also need to assist the patient in arranging follow up appointment in our office and any transportation needs. ° °MAKE SURE YOU:  °Understand these instructions.  °Get help right away if you are not doing well or get worse.  ° ° °Pick up stool softner and laxative for home use following surgery while on pain medications. °Do not submerge incision under water. °Please use good hand washing techniques while changing dressing each day. °May shower starting three days after surgery. °Please use a clean towel to pat the incision dry following showers. °Continue to use ice for  pain and swelling after surgery. °Do not use any lotions or creams on the incision until instructed by your surgeon. ° °Information on my medicine - XARELTO® (Rivaroxaban) ° ° ° °Why was Xarelto® prescribed for you? °Xarelto® was prescribed for you to reduce the risk of blood clots forming after orthopedic surgery. The medical term for   these abnormal blood clots is venous thromboembolism (VTE). ° °What do you need to know about xarelto® ? °Take your Xarelto® ONCE DAILY at the same time every day. °You may take it either with or without food. ° °If you have difficulty swallowing the tablet whole, you may crush it and mix in applesauce just prior to taking your dose. ° °Take Xarelto® exactly as prescribed by your doctor and DO NOT stop taking Xarelto® without talking to the doctor who prescribed the medication.  Stopping without other VTE prevention medication to take the place of Xarelto® may increase your risk of developing a clot. ° °After discharge, you should have regular check-up appointments with your healthcare provider that is prescribing your Xarelto®.   ° °What do you do if you miss a dose? °If you miss a dose, take it as soon as you remember on the same day then continue your regularly scheduled once daily regimen the next day. Do not take two doses of Xarelto® on the same day.  ° °Important Safety Information °A possible side effect of Xarelto® is bleeding. You should call your healthcare provider right away if you experience any of the following: °? Bleeding from an injury or your nose that does not stop. °? Unusual colored urine (red or dark brown) or unusual colored stools (red or black). °? Unusual bruising for unknown reasons. °? A serious fall or if you hit your head (even if there is no bleeding). ° °Some medicines may interact with Xarelto® and might increase your risk of bleeding while on Xarelto®. To help avoid this, consult your healthcare provider or pharmacist prior to using any new  prescription or non-prescription medications, including herbals, vitamins, non-steroidal anti-inflammatory drugs (NSAIDs) and supplements. ° °This website has more information on Xarelto®: www.xarelto.com. ° ° °

## 2018-01-23 NOTE — Op Note (Signed)
NAME: Derek [name], VERDUN MEDICAL RECORD [id number] ACCOUNT [id number] DATE OF BIRTH:27-Feb-1945 FACILITY: [facility] LOCATION: [id number] PHYSICIAN:Christop Hippert Zella Ball, MD  OPERATIVE REPORT  DATE OF PROCEDURE:  01/23/2018  PREOPERATIVE DIAGNOSIS:  Unstable right total knee arthroplasty.  POSTOPERATIVE DIAGNOSIS:  Unstable right total knee arthroplasty.  PROCEDURE:  Right knee polyethylene revision.  SURGEON:  Gaynelle Arabian, MD  ASSISTANT:  Theresa Duty, PA-C  ANESTHESIA:  Spinal and adductor canal block.  ESTIMATED BLOOD LOSS:  25 mL.  DRAIN:  Hemovac x1.  TOURNIQUET TIME:  22 minutes at 300 mmHg.  COMPLICATIONS:  None.  CONDITION:  Stable to recovery.  BRIEF CLINICAL NOTE:  The patient is a 72 year old male who had a right total knee arthroplasty done several years ago.  He had no problems at all until a traumatic episode early in the summer of 2019.  He twisted his knee.  He got it caught up in a  ladder.  Since then, he has had progressive pain, instability, and recurrent hemarthrosis.  Infection workup has been negative.  He had a bone scan which showed no loosening of the prosthesis.  He presents now for polyethylene versus total knee revision.  PROCEDURE IN DETAIL:  After successful administration of adductor canal block and spinal anesthetic, a tourniquet was placed high on the right thigh and right lower extremity, prepped and draped in the usual sterile fashion.  Extremity was wrapped in an  Esmarch.  Tourniquet inflated to 300 mmHg.  A midline incision was made with a 10 blade through subcutaneous tissue to the level of the extensor mechanism.  A fresh blade was used to make a medial parapatellar arthrotomy.  There was some bloody fluid in  the joint.  No evidence of any purulence or abnormal tissue.  There was some synovitis, and a synovectomy was performed.  Soft tissue over the proximal medial tibia was then subperiosteally elevated to the joint line with a knife and into  the  semimembranosus bursa with a Cobb elevator.  Soft tissue laterally was elevated with attention being paid to avoid the patellar tendon on the tibial tubercle.  The patella was everted and knee flexed 90 degrees.  I was able to sublux the tibia forward  from the femur.  I was able to remove the tibial polyethylene without difficulty.  This is a 12.5 mm thickness.  I then inspected the interface between the prosthesis and bone from both the tibial and femoral components, and both were extremely stable.   They were in excellent alignment.  No evidence of any loosening of either component.  We thus decided to trial a thicker polyethylene.  With a 15, there was still a tiny bit of varus-valgus play.  At a 17.5, full extension was achieved with excellent  varus/valgus and anterior/posterior stability throughout full range of motion.  I felt this would be the implant of choice.  The size 5 x 17.5 mm thick Sigma rotating platform per the insert was then placed into the size 5 tibial tray.  The knee was  reduced and had outstanding stability throughout full range of motion.  The wound was then copiously irrigated with saline solution.  Twenty mL of Exparel mixed with 60 mL of saline was then injected in the posterior capsule, the extensor mechanism, and  the periosteum of the femur and subcu tissues.  The arthrotomy was then closed over a Hemovac drain with a running 0 Stratafix suture.  The tourniquet was released for a total time of 22 minutes.  Bleeding was minimal.  The subcu was closed with  interrupted 2-0 Vicryl and subcuticular running 4-0 Monocryl.  The drain was hooked to suction.  Incision was cleaned and dried, and Steri-Strips and a bulky sterile dressing were applied.  He was put into a knee immobilizer, awakened and transported to  recovery in stable condition.  Note that a surgical assistant was a medical necessity for this procedure.  Assistance was necessary for retraction of vital ligaments  and neurovascular structures and for assistance in removing the old implant and for safe and accurate placement of the  new implant.  LN/NUANCE  D:01/23/2018 T:01/23/2018 JOB:[id number]

## 2018-01-23 NOTE — Anesthesia Procedure Notes (Signed)
Spinal  Patient location during procedure: OR Start time: 01/23/2018 4:13 PM End time: 01/23/2018 4:15 PM Staffing Anesthesiologist: Lyn Hollingshead, MD Performed: anesthesiologist  Preanesthetic Checklist Completed: patient identified, site marked, surgical consent, pre-op evaluation, timeout performed, IV checked, risks and benefits discussed and monitors and equipment checked Spinal Block Patient position: sitting Prep: site prepped and draped and DuraPrep Patient monitoring: continuous pulse ox and blood pressure Approach: midline Location: L3-4 Injection technique: single-shot Needle Needle type: Pencan  Needle gauge: 24 G Needle length: 10 cm Needle insertion depth: 7 cm Assessment Sensory level: T8

## 2018-01-23 NOTE — Brief Op Note (Signed)
01/23/2018  5:08 PM  PATIENT:  Derek [name]  72 y.o. male  PRE-OPERATIVE DIAGNOSIS:  unstable right total knee arthroplasty  POST-OPERATIVE DIAGNOSIS:  unstable right total knee arthroplasty  PROCEDURE:  Procedure(s) with comments: right knee polyethylene revision (Right) - 13min  SURGEON:  Surgeon(s) and Role:    Gaynelle Arabian, MD - Primary  PHYSICIAN ASSISTANT:   ASSISTANTS: Theresa Duty, PA-C   ANESTHESIA:   Spinal and adductor canal block  EBL:  25 ml  DRAINS: (Medium) Hemovact drain(s) in the right knee with  Suction Open   LOCAL MEDICATIONS USED:  OTHER Exparel  COUNTS:  YES  TOURNIQUET:   Total Tourniquet Time Documented: Thigh (Right) - 23 minutes Total: Thigh (Right) - 23 minutes   DICTATION: .Other Dictation: Dictation Number [id number]  PLAN OF CARE: Admit to inpatient   PATIENT DISPOSITION:  PACU - hemodynamically stable.

## 2018-01-23 NOTE — Interval H&P Note (Signed)
History and Physical Interval Note:  01/23/2018 2:34 PM  Derek [name]  has presented today for surgery, with the diagnosis of unstable right total knee arthroplasty  The various methods of treatment have been discussed with the patient and family. After consideration of risks, benefits and other options for treatment, the patient has consented to  Procedure(s) with comments: right knee polyethylene vs total knee arthroplasty revision (Right) - 62min as a surgical intervention .  The patient's history has been reviewed, patient examined, no change in status, stable for surgery.  I have reviewed the patient's chart and labs.  Questions were answered to the patient's satisfaction.     Pilar Plate Sophiagrace [place]

## 2018-01-23 NOTE — Transfer of Care (Signed)
Immediate Anesthesia Transfer of Care Note  Patient: Derek [name]  Procedure(s) Performed: right knee polyethylene revision (Right Knee)  Patient Location: PACU  Anesthesia Type:Spinal and MAC combined with regional for post-op pain  Level of Consciousness: awake, alert , oriented and patient cooperative  Airway & Oxygen Therapy: Patient Spontanous Breathing and Patient connected to face mask oxygen  Post-op Assessment: Report given to RN and Post -op Vital signs reviewed and stable  Post vital signs: Reviewed and stable  Last Vitals:  Vitals Value Taken Time  BP 114/66 01/23/2018  5:35 PM  Temp    Pulse 66 01/23/2018  5:38 PM  Resp 17 01/23/2018  5:38 PM  SpO2 100 % 01/23/2018  5:38 PM  Vitals shown include unvalidated device data.  Last Pain:  Vitals:   01/23/18 1435  PainSc: 2          Complications: No apparent anesthesia complications

## 2018-01-23 NOTE — Anesthesia Procedure Notes (Signed)
Anesthesia Regional Block: Adductor canal block   Pre-Anesthetic Checklist: ,, timeout performed, Correct Patient, Correct Site, Correct Laterality, Correct Procedure, Correct Position, site marked, Risks and benefits discussed,  Surgical consent,  Pre-op evaluation,  At surgeon's request and post-op pain management  Laterality: Lower and Right  Prep: chloraprep       Needles:  Injection technique: Single-shot  Needle Type: Echogenic Stimulator Needle     Needle Length: 10cm  Needle Gauge: 21   Needle insertion depth: 3 cm   Additional Needles:   Procedures:,,,, ultrasound used (permanent image in chart),,,,  Narrative:  Start time: 01/23/2018 3:50 PM End time: 01/23/2018 3:57 PM Injection made incrementally with aspirations every 5 mL.  Performed by: Personally  Anesthesiologist: Lyn Hollingshead, MD

## 2018-01-23 NOTE — Anesthesia Procedure Notes (Signed)
Procedure Name: MAC Date/Time: 01/23/2018 4:06 PM Performed by: West Pugh, CRNA Pre-anesthesia Checklist: Patient identified, Emergency Drugs available, Suction available, Patient being monitored and Timeout performed Patient Re-evaluated:Patient Re-evaluated prior to induction Oxygen Delivery Method: Simple face mask Preoxygenation: Pre-oxygenation with 100% oxygen Induction Type: IV induction Placement Confirmation: positive ETCO2 Dental Injury: Teeth and Oropharynx as per pre-operative assessment

## 2018-01-23 NOTE — Progress Notes (Signed)
Assisted Dr.Kevin Hatchett with right knee abductor canal  block. Side rails up, monitors on throughout procedure. See vital signs in flow sheet. Tolerated Procedure well.

## 2018-01-24 ENCOUNTER — Encounter (HOSPITAL_COMMUNITY): Payer: Self-pay | Admitting: Orthopedic Surgery

## 2018-01-24 LAB — BASIC METABOLIC PANEL
Anion gap: 9 (ref 5–15)
BUN: 23 mg/dL (ref 8–23)
CO2: 22 mmol/L (ref 22–32)
Calcium: 9.8 mg/dL (ref 8.9–10.3)
Chloride: 102 mmol/L (ref 98–111)
Creatinine, Ser: 0.98 mg/dL (ref 0.61–1.24)
GFR calc Af Amer: >60 mL/min (ref >60)
GFR calc non Af Amer: >60 mL/min (ref >60)
Glucose, Bld: 320 mg/dL — ABNORMAL HIGH (ref 70–99)
Potassium: 5.1 mmol/L (ref 3.5–5.1)
Sodium: 133 mmol/L — ABNORMAL LOW (ref 135–145)

## 2018-01-24 LAB — CBC
HCT: 41.8 % (ref 39.0–52.0)
Hemoglobin: 13.1 g/dL (ref 13.0–17.0)
MCH: 27.8 pg (ref 26.0–34.0)
MCHC: 31.3 g/dL (ref 30.0–36.0)
MCV: 88.6 fL (ref 80.0–100.0)
Platelets: 450 10*3/uL — ABNORMAL HIGH (ref 150–400)
RBC: 4.72 MIL/uL (ref 4.22–5.81)
RDW: 14.3 % (ref 11.5–15.5)
WBC: 10.1 10*3/uL (ref 4.0–10.5)
nRBC: 0 % (ref 0.0–0.2)

## 2018-01-24 LAB — GLUCOSE, CAPILLARY
Glucose-Capillary: 306 mg/dL — ABNORMAL HIGH (ref 70–99)
Glucose-Capillary: 245 mg/dL — ABNORMAL HIGH (ref 70–99)

## 2018-01-24 MED ORDER — METHOCARBAMOL 500 MG PO TABS: 500.0000 mg | ORAL_TABLET | Freq: Four times a day (QID) | ORAL | 0 refills | Status: DC | PRN

## 2018-01-24 MED ORDER — RIVAROXABAN 10 MG PO TABS: 10.0000 mg | ORAL_TABLET | Freq: Every day | ORAL | 0 refills | Status: DC

## 2018-01-24 MED FILL — METHOCARBAMOL 500 MG TABLET: 500 | 10 days supply | Qty: 40 | Fill #0

## 2018-01-24 MED FILL — XARELTO 10 MG TABLET: 10 | 20 days supply | Qty: 20 | Fill #0

## 2018-01-24 NOTE — Evaluation (Signed)
Physical Therapy Evaluation Patient Details Name: Derek [name] MRN: [id number] DOB: [birth year] Today's Date: 01/24/2018   History of Present Illness  Pt s/p polyethylene revision of R TKR  Clinical Impression  Pt s/p R TKR revision and presents with decreased R LE strength/ROM and post op pain limiting functional mobility.  Pt currently mobilizing at min guard to supervision level and eager for dc home.  Stairs and home therex program reviewed with written instruction provided.    Follow Up Recommendations Outpatient PT    Equipment Recommendations  None recommended by PT    Recommendations for Other Services       Precautions / Restrictions Precautions Precautions: Knee;Fall Restrictions Weight Bearing Restrictions: No Other Position/Activity Restrictions: WBAT      Mobility  Bed Mobility Overal bed mobility: Modified Independent             General bed mobility comments: pt unassisted  Transfers Overall transfer level: Needs assistance Equipment used: Rolling walker (2 wheeled) Transfers: Sit to/from Stand Sit to Stand: Min guard;Supervision         General transfer comment: min cues for use of UEs and for LE management  Ambulation/Gait Ambulation/Gait assistance: Min guard;Supervision Gait Distance (Feet): 150 Feet Assistive device: Rolling walker (2 wheeled) Gait Pattern/deviations: Step-to pattern;Decreased step length - right;Decreased step length - left;Shuffle;Trunk flexed Gait velocity: decr   General Gait Details: cues for sequence, posture and position from RW  Stairs Stairs: Yes Stairs assistance: Min guard Stair Management: Two rails;Step to pattern;Forwards Number of Stairs: 2 General stair comments: Pt self cues for sequence  Wheelchair Mobility    Modified Rankin (Stroke Patients Only)       Balance Overall balance assessment: Mild deficits observed, not formally tested                                          Pertinent Vitals/Pain Pain Assessment: 0-10 Pain Score: 3  Pain Location: R knee Pain Descriptors / Indicators: Aching;Sore Pain Intervention(s): Limited activity within patient's tolerance;Monitored during session;Premedicated before session;Ice applied    Home Living Family/patient expects to be discharged to:: Private residence Living Arrangements: Spouse/significant other Available Help at Discharge: Family Type of Home: House Home Access: Stairs to enter Entrance Stairs-Rails: Right;Left;Can reach both Entrance Stairs-Number of Steps: 4 Home Layout: Able to live on main level with bedroom/bathroom Home Equipment: Walker - 2 wheels;Cane - single point;Crutches      Prior Function Level of Independence: Independent               Hand Dominance        Extremity/Trunk Assessment   Upper Extremity Assessment Upper Extremity Assessment: Overall WFL for tasks assessed    Lower Extremity Assessment Lower Extremity Assessment: RLE deficits/detail RLE Deficits / Details: 3/5 quads with IND SLR; AAROM at knee -10 - 85       Communication   Communication: HOH  Cognition Arousal/Alertness: Awake/alert Behavior During Therapy: WFL for tasks assessed/performed Overall Cognitive Status: Within Functional Limits for tasks assessed                                        General Comments      Exercises Total Joint Exercises Ankle Circles/Pumps: AROM;15 reps;Supine;Both Quad Sets: AROM;Both;10 reps;Supine Heel Slides: AAROM;Right;15 reps;Supine Straight Leg  Raises: AAROM;AROM;Right;15 reps;Supine Long Arc Quad: AROM;Right;10 reps;Seated Knee Flexion: AAROM;AROM;Right;15 reps;Seated   Assessment/Plan    PT Assessment Patient needs continued PT services  PT Problem List Decreased strength;Decreased range of motion;Decreased activity tolerance;Decreased mobility;Decreased knowledge of use of DME;Pain       PT Treatment Interventions DME  instruction;Gait training;Stair training;Functional mobility training;Therapeutic activities;Therapeutic exercise;Patient/family education    PT Goals (Current goals can be found in the Care Plan section)  Acute Rehab PT Goals Patient Stated Goal: Regain IND PT Goal Formulation: All assessment and education complete, DC therapy    Frequency Min 1X/week   Barriers to discharge        Co-evaluation               AM-PAC PT "6 Clicks" Mobility  Outcome Measure Help needed turning from your back to your side while in a flat bed without using bedrails?: None Help needed moving from lying on your back to sitting on the side of a flat bed without using bedrails?: None Help needed moving to and from a bed to a chair (including a wheelchair)?: None Help needed standing up from a chair using your arms (e.g., wheelchair or bedside chair)?: A Little Help needed to walk in hospital room?: A Little Help needed climbing 3-5 steps with a railing? : A Little 6 Click Score: 21    End of Session   Activity Tolerance: Patient tolerated treatment well Patient left: in bed Nurse Communication: Mobility status PT Visit Diagnosis: Difficulty in walking, not elsewhere classified (R26.2)    Time: [id number] PT Time Calculation (min) (ACUTE ONLY): 38 min   Charges:   PT Evaluation $PT Eval Low Complexity: 1 Low PT Treatments $Gait Training: 8-22 mins $Therapeutic Exercise: 8-22 mins        Debe Coder PT Acute Rehabilitation Services Pager [phone] Office [phone]   Rema Lievanos 01/24/2018, 1:02 PM

## 2018-01-24 NOTE — Progress Notes (Signed)
Subjective: 1 Day Post-Op Procedure(s) (LRB): right knee polyethylene revision (Right) Patient reports pain as mild.   Patient seen in rounds with Dr. Wynelle Link. Patient is well, and has had no acute complaints or problems. States he is ready to go home. Foley catheter removed this AM. Denies chest pain, SOB, or calf pain. No issues overnight. We will start therapy today.   Objective: Vital signs in last 24 hours: Temp:  [97.3 F (36.3 C)-97.9 F (36.6 C)] 97.7 F (36.5 C) (12/05 0447) Pulse Rate:  [52-95] 86 (12/05 0447) Resp:  [6-24] 14 (12/05 0447) BP: (114-157)/(65-105) 125/82 (12/05 0447) SpO2:  [91 %-100 %] 96 % (12/05 0447) Weight:  [121.6 kg] 121.6 kg (12/04 1850)  Intake/Output from previous day:  Intake/Output Summary (Last 24 hours) at 01/24/2018 0732 Last data filed at 01/24/2018 0600 Gross per 24 hour  Intake 3511.03 ml  Output 2680 ml  Net 831.03 ml    Labs: Recent Labs    01/24/18 0527  HGB 13.1   Recent Labs    01/24/18 0527  WBC 10.1  RBC 4.72  HCT 41.8  PLT 450*   Recent Labs    01/24/18 0527  NA 133*  K 5.1  CL 102  CO2 22  BUN 23  CREATININE 0.98  GLUCOSE 320*  CALCIUM 9.8   Exam: General - Patient is Alert and Oriented Extremity - Neurologically intact Neurovascular intact Sensation intact distally Dorsiflexion/Plantar flexion intact Dressing - dressing C/D/I Motor Function - intact, moving foot and toes well on exam.   Past Medical History:  Diagnosis Date  . Anxiety   . Arthritis   . Bilateral leg cramps    takes magnesium  . BPH (benign prostatic hyperplasia)   . DDD (degenerative disc disease), lumbar    gets back injections   . Deafness in right ear    meniere's disease  . Depression   . Dermatitis    bilateral hands  . Factor V Leiden mutation ([place])    "never had blood clots"  . GERD (gastroesophageal reflux disease)   . Hiatal hernia   . History of kidney stones    multiple kidney stones-not a problem at  present  . 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 History of panic attacks   . 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 Hypertension   . Incomplete right bundle branch block   . Insulin dependent type 2 diabetes mellitus, uncontrolled ([place]) followed by dr [name] ([place])   dx 2007--- ltype 1 now since jan 2017 surgery with part of pancreas removed  . Lower urinary tract symptoms (LUTS)   . Meniere's disease dx 1970s   intermittant vertigo  . Obstructive sleep apnea    " i used to have sleep apnea" never used a c-pap   . Open wound of abdomen    WET/DRY DRESSING CHANGES TID--- POST ABD. SURGERY 09-21-2016 healed now  . Peripheral vascular disease ([facility])    right leg - decreased pulse   . Wears partial dentures    LOWER  . 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   Weight as of this encounter: 121.6 kg. Advance diet Up with therapy D/C IV fluids  DVT Prophylaxis - Xarelto Weight bearing as tolerated. D/C O2 and pulse ox and try on room air. Hemovac pulled without difficulty, will begin therapy.  Plan is to go Home after hospital stay. Plan for discharge this afternoon after two sessions of physical therapy as long as meeting his goals. Scheduled for outpatient PT at [facility] beginning Monday. Follow-up in the office in 2 weeks with Dr. Wynelle Link.  Theresa Duty, PA-C Orthopedic Surgery 01/24/2018, 7:32 AM

## 2018-01-25 DIAGNOSIS — R3914 Feeling of incomplete bladder emptying: Secondary | ICD-10-CM | POA: Diagnosis not present

## 2018-01-28 ENCOUNTER — Encounter: Payer: Self-pay | Admitting: Physical Therapy

## 2018-01-28 ENCOUNTER — Other Ambulatory Visit: Payer: Self-pay

## 2018-01-28 ENCOUNTER — Ambulatory Visit: Payer: PPO | Attending: Student | Admitting: Physical Therapy

## 2018-01-28 DIAGNOSIS — M6281 Muscle weakness (generalized): Secondary | ICD-10-CM | POA: Insufficient documentation

## 2018-01-28 DIAGNOSIS — M25661 Stiffness of right knee, not elsewhere classified: Secondary | ICD-10-CM | POA: Diagnosis not present

## 2018-01-28 DIAGNOSIS — R262 Difficulty in walking, not elsewhere classified: Secondary | ICD-10-CM | POA: Diagnosis not present

## 2018-01-28 DIAGNOSIS — M25561 Pain in right knee: Secondary | ICD-10-CM | POA: Diagnosis not present

## 2018-01-28 IMAGING — US US GUIDANCE NEEDLE PLACEMENT
1 series · 1 of 1 positions shown · non-contrast
Comparison: CT 03/29/2015, 03/20/2015

CLINICAL DATA: 70-year-old male with a history of pancreatic
pseudocyst and abdominal abscesses with presumed left sympathetic
effusion/empyema.

He has been referred for sampling.
EXAM:
LIMITED ULTRASOUND LEFT CHEST
:

[Series 1: us thy biop/para · 1 of 1 slices shown]
[im 1/1]
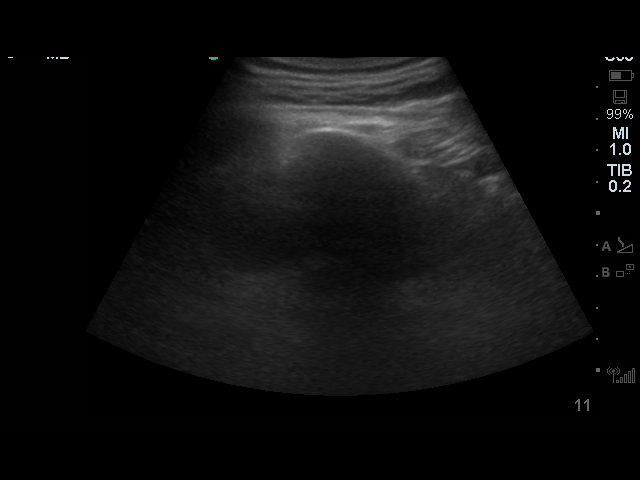

[1 of 1 positions shown; findings below may reference images not displayed]

PROCEDURE:
An ultrasound guided thoracentesis was thoroughly discussed with the
patient and questions answered. The benefits, risks, alternatives
and complications were also discussed. The patient understands and
wishes to proceed with the procedure. Written consent was obtained.

Ultrasound survey performed the left chest. Small window for a
posterior approach, with ultrasound-guided thoracentesis attempted.

The area was prepped and draped in the normal sterile fashion. 1%
Lidocaine was used for local anesthesia. Under ultrasound guidance a
19 gauge Yueh catheter was introduced. No significant fluid was
aspirated from small fluid collection. Second site was attempted.

Patient tolerated the procedure well and remained hemodynamically
stable throughout.

No significant blood loss encountered.  No complications.
FINDINGS: Small fluid pocket from posterior approach to the chest.

No significant fluid was aspirated.
IMPRESSION: Status post left ultrasound-guided lower with no significant fluid
aspirated from small pocket from posterior thoracic approach. The
patient will be rescheduled for a CT guided aspiration/drainage of
pleural effusion from anterior lateral approach.

## 2018-01-28 IMAGING — DX DG CHEST 1V
1 series · 1 of 1 positions shown · non-contrast
Comparison: 03/20/2015

CLINICAL DATA: Attempted LEFT thoracentesis

EXAM:
CHEST 1 VIEW

[chest ap]
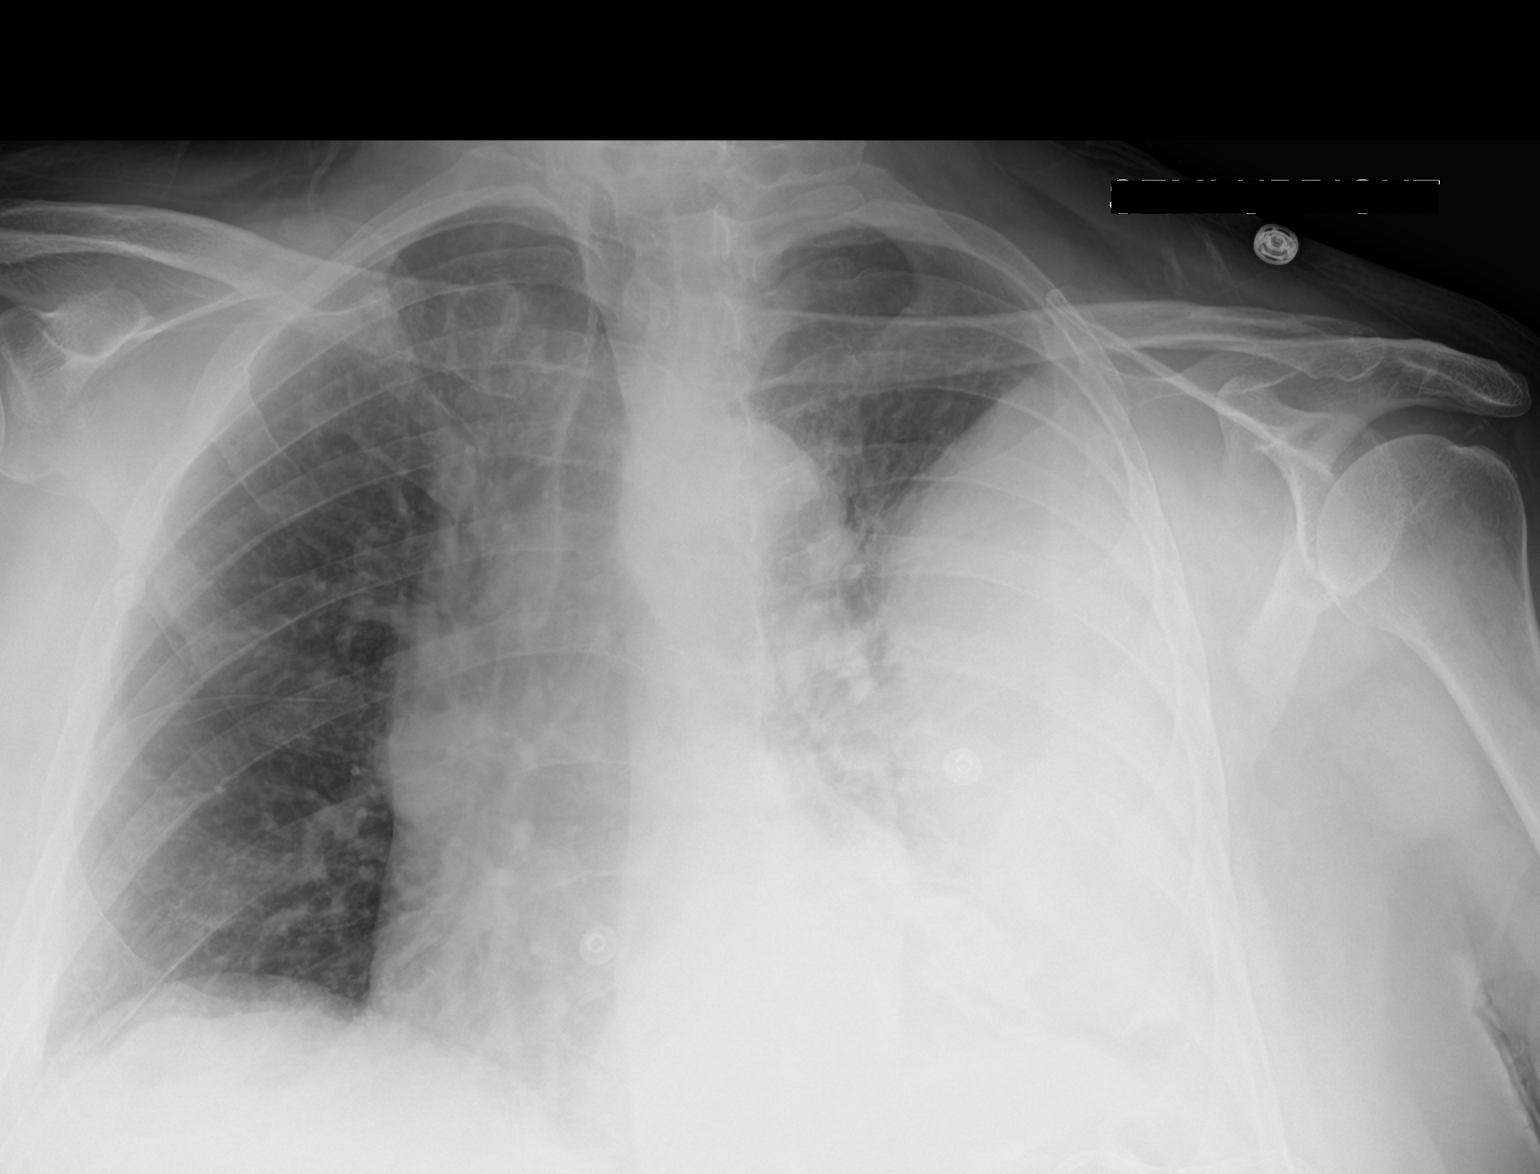

[1 of 1 positions shown; findings below may reference images not displayed]

FINDINGS: Rotation to the RIGHT.

Enlargement of cardiac silhouette.

Pulmonary vascular congestion.

Large peripheral opacity at the lateral aspect of the LEFT hemi
thorax extending from above the aortic arch to the diaphragm likely
representing a loculated pleural fluid collection.

This is significantly increased since 03/20/2015.

Significant compressive atelectasis of the mid to lower LEFT lung.

RIGHT lung grossly clear.

No pneumothorax.

Bones demineralized.
IMPRESSION: Significant interval increase in size of a large loculated LEFT
pleural collection since 03/20/2015.
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
## 2018-01-29 NOTE — Discharge Summary (Signed)
Physician Discharge Summary   Patient ID: Derek [name] MRN: [id number] DOB/AGE: 1945-03-18 72 y.o.  Admit date: 01/23/2018 Discharge date: 01/24/2018  Primary Diagnosis: Unstable right total knee arthroplasty   Admission Diagnoses:  Past Medical History:  Diagnosis Date  . Anxiety   . Arthritis   . Bilateral leg cramps    takes magnesium  . BPH (benign prostatic hyperplasia)   . DDD (degenerative disc disease), lumbar    gets back injections   . Deafness in right ear    meniere's disease  . Depression   . Dermatitis    bilateral hands  . Factor V Leiden mutation ([place])    "never had blood clots"  . GERD (gastroesophageal reflux disease)   . Hiatal hernia   . History of kidney stones    multiple kidney stones-not a problem at present  . 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 History of panic attacks   . 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 Hypertension   . Incomplete right bundle branch block   . Insulin dependent type 2 diabetes mellitus, uncontrolled ([place]) followed by dr [name] ([place])   dx 2007--- ltype 1 now since jan 2017 surgery with part of pancreas removed  . Lower urinary tract symptoms (LUTS)   . Meniere's disease dx 1970s   intermittant vertigo  . Obstructive sleep apnea    " i used to have sleep apnea" never used a c-pap   . Open wound of abdomen    WET/DRY DRESSING CHANGES TID--- POST ABD. SURGERY 09-21-2016 healed now  . Peripheral vascular disease (HCC)    right leg - decreased pulse   . Wears partial dentures    LOWER  . 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   Weight as of this encounter: 121.6 kg.  Procedure:  Procedure(s) (LRB): right knee polyethylene revision (Right)   Consults: None  HPI: The patient is a 72 year old male who had a right total knee arthroplasty done several years ago.  He had no problems at all until a traumatic episode early in the summer of 2019.  He twisted his knee.  He got it caught up in a ladder.  Since then, he has had progressive pain, instability, and recurrent hemarthrosis.  Infection workup has been negative.  He had a bone scan which showed no loosening of the prosthesis.  He presents now for polyethylene versus total knee revision.  Laboratory Data: Admission on 01/23/2018, Discharged on 01/24/2018  Component Date Value Ref Range Status  . Glucose-Capillary 01/23/2018 136* 70 - 99 mg/dL Final  . Glucose-Capillary 01/23/2018 147* 70 - 99 mg/dL Final  . Glucose-Capillary 01/23/2018 131* 70 - 99 mg/dL Final  . WBC 01/24/2018 10.1  4.0 - 10.5 K/uL Final  . RBC 01/24/2018 4.72  4.22 - 5.81 MIL/uL Final  . Hemoglobin 01/24/2018 13.1  13.0 - 17.0 g/dL Final  . HCT 01/24/2018 41.8  39.0 - 52.0 % Final  . MCV 01/24/2018 88.6  80.0 - 100.0 fL Final  . MCH 01/24/2018 27.8  26.0 -  34.0 pg Final  . MCHC 01/24/2018 31.3  30.0 - 36.0 g/dL Final  . RDW 01/24/2018 14.3  11.5 - 15.5 % Final  . Platelets 01/24/2018 450* 150 - 400 K/uL Final  . nRBC 01/24/2018 0.0  0.0 - 0.2 % Final   Performed at [facility], [place] [address], [place], [place] [address]  . Sodium 01/24/2018 133* 135 - 145 mmol/L Final  . Potassium 01/24/2018 5.1  3.5 - 5.1 mmol/L Final  . Chloride 01/24/2018 102  98 - 111 mmol/L Final  . CO2 01/24/2018 22  22 - 32 mmol/L Final  . Glucose, Bld 01/24/2018 320* 70 - 99 mg/dL Final  . BUN 01/24/2018 23  8 - 23 mg/dL Final  . Creatinine, Ser 01/24/2018 0.98  0.61 - 1.24 mg/dL Final  . Calcium 01/24/2018 9.8  8.9 - 10.3 mg/dL Final  . GFR calc non Af Amer 01/24/2018 >60  >60  mL/min Final  . GFR calc Af Amer 01/24/2018 >60  >60 mL/min Final  . Anion gap 01/24/2018 9  5 - 15 Final   Performed at [facility], [place] [address], [place], [place] [address]  . Glucose-Capillary 01/23/2018 279* 70 - 99 mg/dL Final  . Glucose-Capillary 01/24/2018 306* 70 - 99 mg/dL Final  . Glucose-Capillary 01/24/2018 245* 70 - 99 mg/dL Final  Hospital Outpatient Visit on 01/14/2018  Component Date Value Ref Range Status  . Glucose-Capillary 01/14/2018 190* 70 - 99 mg/dL Final  . MRSA, PCR 01/14/2018 NEGATIVE  NEGATIVE Final  . Staphylococcus aureus 01/14/2018 NEGATIVE  NEGATIVE Final   Comment: (NOTE) The Xpert SA Assay (FDA approved for NASAL specimens in patients 20 years of age and older), is one component of a comprehensive surveillance program. It is not intended to diagnose infection nor to guide or monitor treatment. Performed at [facility], [place] [address] [place], [place] [address]   . aPTT 01/14/2018 32  24 - 36 seconds Final   Performed at [facility], [place] [address], [place], [place] [address]  . WBC 01/14/2018 9.0  4.0 - 10.5 K/uL Final  . RBC 01/14/2018 4.74  4.22 - 5.81 MIL/uL Final  . Hemoglobin 01/14/2018 13.4  13.0 - 17.0 g/dL Final  . HCT 01/14/2018 42.6  39.0 - 52.0 % Final  . MCV 01/14/2018 89.9  80.0 - 100.0 fL Final  . MCH 01/14/2018 28.3  26.0 - 34.0 pg Final  . MCHC 01/14/2018 31.5  30.0 - 36.0 g/dL Final  . RDW 01/14/2018 14.4  11.5 - 15.5 % Final  . Platelets 01/14/2018 445* 150 - 400 K/uL Final  . nRBC 01/14/2018 0.0  0.0 - 0.2 % Final   Performed at [facility], [place] [address] [place],  [address]  . Sodium 01/14/2018 137  135 - 145 mmol/L Final  . Potassium 01/14/2018 5.2* 3.5 - 5.1 mmol/L Final  . Chloride 01/14/2018 107  98 - 111 mmol/L Final  . CO2 01/14/2018 24  22 - 32 mmol/L Final  . Glucose, Bld 01/14/2018 189* 70 - 99 mg/dL Final  . BUN 01/14/2018 24*  8 - 23 mg/dL Final  . Creatinine, Ser 01/14/2018 1.15  0.61 - 1.24 mg/dL Final  . Calcium 01/14/2018 10.5* 8.9 - 10.3 mg/dL Final  . Total Protein 01/14/2018 7.3  6.5 - 8.1 g/dL Final  . Albumin 01/14/2018 3.7  3.5 - 5.0 g/dL Final  . AST 01/14/2018 22  15 - 41 U/L Final  .  ALT 01/14/2018 27  0 - 44 U/L Final  . Alkaline Phosphatase 01/14/2018 86  38 - 126 U/L Final  . Total Bilirubin 01/14/2018 0.7  0.3 - 1.2 mg/dL Final  . GFR calc non Af Amer 01/14/2018 >60  >60 mL/min Final  . GFR calc Af Amer 01/14/2018 >60  >60 mL/min Final   Comment: (NOTE) The eGFR has been calculated using the CKD EPI equation. This calculation has not been validated in all clinical situations. eGFR's persistently <60 mL/min signify possible Chronic Kidney Disease.   Georgiann Hahn gap 01/14/2018 6  5 - 15 Final   Performed at [facility], [place] [address], [place], [place] [address]  . Prothrombin Time 01/14/2018 13.5  11.4 - 15.2 seconds Final  . INR 01/14/2018 1.04   Final   Performed at [facility], [place] [address], [place], [place] [address]  . ABO/RH(D) 01/14/2018 O POS   Final  . Antibody Screen 01/14/2018 NEG   Final  . Sample Expiration 01/14/2018 01/26/2018   Final  . Extend sample reason 01/14/2018    Final                   Value:NO TRANSFUSIONS OR PREGNANCY IN THE PAST 3 MONTHS Performed at [facility] [place] [address], [place], [place] [address]      X-Rays:No results found.  EKG: Orders placed or performed in visit on 07/26/17  . EKG 12-Lead     Hospital Course: Derek [name] is a 72 y.o. who was admitted to [facility]. They were brought to the operating room on 01/23/2018 and underwent Procedure(s): right knee polyethylene revision.  Patient tolerated the procedure well and was later transferred to the recovery room and then to the orthopaedic floor for postoperative care. They were given PO and IV analgesics for pain control  following their surgery. They were given 24 hours of postoperative antibiotics of  Anti-infectives (From admission, onward)   Start     Dose/Rate Route Frequency Ordered Stop   01/23/18 2200  ceFAZolin (ANCEF) IVPB 2g/100 mL premix     2 g 200 mL/hr over 30 Minutes Intravenous Every 6 hours 01/23/18 1904 01/24/18 0440   01/23/18 0600  ceFAZolin (ANCEF) 3 g in dextrose 5 % 50 mL IVPB     3 g 100 mL/hr over 30 Minutes Intravenous On call to O.R. 01/22/18 0930 01/23/18 1646     and started on DVT prophylaxis in the form of Xarelto.   PT and OT were ordered for total joint protocol. Discharge planning consulted to help with postop disposition and equipment needs.  Patient had a good night on the evening of surgery. They started to get up OOB with therapy on POD #1. Pt was seen during rounds and was ready to go home pending progress with therapy. Hemovac drain was pulled without difficulty. He worked with therapy on POD #1 and was meeting his goals. Pt was discharged to home later that day in stable condition.  Diet: Diabetic diet Activity: WBAT Follow-up: in 2 weeks with Dr. Wynelle Link Disposition: Home with outpatient PT at [facility] Discharged Condition: stable   Discharge Instructions    Call MD / Call 911   Complete by:  As directed    If you experience chest pain or shortness of breath, CALL 911 and be transported to the hospital emergency room.  If you develope a fever above 101 F, pus (white drainage) or increased  drainage or redness at the wound, or calf pain, call your surgeon's office.   Change dressing   Complete by:  As directed    Change dressing on Friday, then change the dressing daily with sterile 4 x 4 inch gauze dressing and apply TED hose.   Constipation Prevention   Complete by:  As directed    Drink plenty of fluids.  Prune juice may be helpful.  You may use a stool softener, such as Colace (over the counter) 100 mg twice a day.  Use MiraLax (over the counter)  for constipation as needed.   Diet - low sodium heart healthy   Complete by:  As directed    Discharge instructions   Complete by:  As directed    Dr. Gaynelle Arabian Total Joint Specialist [facility] [address], [place], [place] [address] [phone]  TOTAL KNEE REPLACEMENT POSTOPERATIVE DIRECTIONS  Knee Rehabilitation, Guidelines Following Surgery  Results after knee surgery are often greatly improved when you follow the exercise, range of motion and muscle strengthening exercises prescribed by your doctor. Safety measures are also important to protect the knee from further injury. Any time any of these exercises cause you to have increased pain or swelling in your knee joint, decrease the amount until you are comfortable again and slowly increase them. If you have problems or questions, call your caregiver or physical therapist for advice.   HOME CARE INSTRUCTIONS  Remove items at home which could result in a fall. This includes throw rugs or furniture in walking pathways.  ICE to the affected knee every three hours for 30 minutes at a time and then as needed for pain and swelling.  Continue to use ice on the knee for pain and swelling from surgery. You may notice swelling that will progress down to the foot and ankle.  This is normal after surgery.  Elevate the leg when you are not up walking on it.   Continue to use the breathing machine which will help keep your temperature down.  It is common for your temperature to cycle up and down following surgery, especially at night when you are not up moving around and exerting yourself.  The breathing machine keeps your lungs expanded and your temperature down. Do not place pillow under knee, focus on keeping the knee straight while resting   DIET You may resume your previous home diet once your are discharged from the hospital.  DRESSING / WOUND CARE / SHOWERING You may shower 3 days after surgery, but keep the wounds dry  during showering.  You may use an occlusive plastic wrap (Press'n Seal for example), NO SOAKING/SUBMERGING IN THE BATHTUB.  If the bandage gets wet, change with a clean dry gauze.  If the incision gets wet, pat the wound dry with a clean towel. You may start showering once you are discharged home but do not submerge the incision under water. Just pat the incision dry and apply a dry gauze dressing on daily. Change the surgical dressing daily and reapply a dry dressing each time.  ACTIVITY Walk with your walker as instructed. Use walker as long as suggested by your caregivers. Avoid periods of inactivity such as sitting longer than an hour when not asleep. This helps prevent blood clots.  You may resume a sexual relationship in one month or when given the OK by your doctor.  You may return to work once you are cleared by your doctor.  Do not drive a car  for 6 weeks or until released by you surgeon.  Do not drive while taking narcotics.  WEIGHT BEARING Weight bearing as tolerated with assist device (walker, cane, etc) as directed, use it as long as suggested by your surgeon or therapist, typically at least 4-6 weeks.  POSTOPERATIVE CONSTIPATION PROTOCOL Constipation - defined medically as fewer than three stools per week and severe constipation as less than one stool per week.  One of the most common issues patients have following surgery is constipation.  Even if you have a regular bowel pattern at home, your normal regimen is likely to be disrupted due to multiple reasons following surgery.  Combination of anesthesia, postoperative narcotics, change in appetite and fluid intake all can affect your bowels.  In order to avoid complications following surgery, here are some recommendations in order to help you during your recovery period.  Colace (docusate) - Pick up an over-the-counter form of Colace or another stool softener and take twice a day as long as you are requiring postoperative pain  medications.  Take with a full glass of water daily.  If you experience loose stools or diarrhea, hold the colace until you stool forms back up.  If your symptoms do not get better within 1 week or if they get worse, check with your doctor.  Dulcolax (bisacodyl) - Pick up over-the-counter and take as directed by the product packaging as needed to assist with the movement of your bowels.  Take with a full glass of water.  Use this product as needed if not relieved by Colace only.   MiraLax (polyethylene glycol) - Pick up over-the-counter to have on hand.  MiraLax is a solution that will increase the amount of water in your bowels to assist with bowel movements.  Take as directed and can mix with a glass of water, juice, soda, coffee, or tea.  Take if you go more than two days without a movement. Do not use MiraLax more than once per day. Call your doctor if you are still constipated or irregular after using this medication for 7 days in a row.  If you continue to have problems with postoperative constipation, please contact the office for further assistance and recommendations.  If you experience "the worst abdominal pain ever" or develop nausea or vomiting, please contact the office immediatly for further recommendations for treatment.  ITCHING  If you experience itching with your medications, try taking only a single pain pill, or even half a pain pill at a time.  You can also use Benadryl over the counter for itching or also to help with sleep.   TED HOSE STOCKINGS Wear the elastic stockings on both legs for three weeks following surgery during the day but you may remove then at night for sleeping.  MEDICATIONS See your medication summary on the "After Visit Summary" that the nursing staff will review with you prior to discharge.  You may have some home medications which will be placed on hold until you complete the course of blood thinner medication.  It is important for you to complete the blood  thinner medication as prescribed by your surgeon.  Continue your approved medications as instructed at time of discharge.  PRECAUTIONS If you experience chest pain or shortness of breath - call 911 immediately for transfer to the hospital emergency department.  If you develop a fever greater that 101 F, purulent drainage from wound, increased redness or drainage from wound, foul odor from the wound/dressing, or calf pain - CONTACT  YOUR SURGEON.                                                   FOLLOW-UP APPOINTMENTS Make sure you keep all of your appointments after your operation with your surgeon and caregivers. You should call the office at the above phone number and make an appointment for approximately two weeks after the date of your surgery or on the date instructed by your surgeon outlined in the "After Visit Summary".   RANGE OF MOTION AND STRENGTHENING EXERCISES  Rehabilitation of the knee is important following a knee injury or an operation. After just a few days of immobilization, the muscles of the thigh which control the knee become weakened and shrink (atrophy). Knee exercises are designed to build up the tone and strength of the thigh muscles and to improve knee motion. Often times heat used for twenty to thirty minutes before working out will loosen up your tissues and help with improving the range of motion but do not use heat for the first two weeks following surgery. These exercises can be done on a training (exercise) mat, on the floor, on a table or on a bed. Use what ever works the best and is most comfortable for you Knee exercises include:  Leg Lifts - While your knee is still immobilized in a splint or cast, you can do straight leg raises. Lift the leg to 60 degrees, hold for 3 sec, and slowly lower the leg. Repeat 10-20 times 2-3 times daily. Perform this exercise against resistance later as your knee gets better.  Quad and Hamstring Sets - Tighten up the muscle on the front of  the thigh (Quad) and hold for 5-10 sec. Repeat this 10-20 times hourly. Hamstring sets are done by pushing the foot backward against an object and holding for 5-10 sec. Repeat as with quad sets.  Leg Slides: Lying on your back, slowly slide your foot toward your buttocks, bending your knee up off the floor (only go as far as is comfortable). Then slowly slide your foot back down until your leg is flat on the floor again. Angel Wings: Lying on your back spread your legs to the side as far apart as you can without causing discomfort.  A rehabilitation program following serious knee injuries can speed recovery and prevent re-injury in the future due to weakened muscles. Contact your doctor or a physical therapist for more information on knee rehabilitation.   IF YOU ARE TRANSFERRED TO A SKILLED REHAB FACILITY If the patient is transferred to a skilled rehab facility following release from the hospital, a list of the current medications will be sent to the facility for the patient to continue.  When discharged from the skilled rehab facility, please have the facility set up the patient's [place] prior to being released. Also, the skilled facility will be responsible for providing the patient with their medications at time of release from the facility to include their pain medication, the muscle relaxants, and their blood thinner medication. If the patient is still at the rehab facility at time of the two week follow up appointment, the skilled rehab facility will also need to assist the patient in arranging follow up appointment in our office and any transportation needs.  MAKE SURE YOU:  Understand these instructions.  Get help right away  if you are not doing well or get worse.    Pick up stool softner and laxative for home use following surgery while on pain medications. Do not submerge incision under water. Please use good hand washing techniques while changing dressing each  day. May shower starting three days after surgery. Please use a clean towel to pat the incision dry following showers. Continue to use ice for pain and swelling after surgery. Do not use any lotions or creams on the incision until instructed by your surgeon.   Do not put a pillow under the knee. Place it under the heel.   Complete by:  As directed    Driving restrictions   Complete by:  As directed    No driving for two weeks   TED hose   Complete by:  As directed    Use stockings (TED hose) for three weeks on both leg(s).  You may remove them at night for sleeping.   Weight bearing as tolerated   Complete by:  As directed      Allergies as of 01/24/2018      Reactions   Oxycontin [oxycodone Hcl] Swelling, Other (See Comments)   Lips swells. Tolerates immediate-release oxycodone as well as hydrocodone.   Quinine Other (See Comments)   Platelets dropped   Voltaren [diclofenac Sodium] Other (See Comments)   Elevated liver enzymes   Doxycycline Other (See Comments)   Severe nose bleeds   Morphine And Related Nausea And Vomiting      Medication List    STOP taking these medications   aspirin EC 81 MG tablet   PROBIOTIC PO   vitamin C 500 MG tablet Commonly known as:  ASCORBIC ACID     TAKE these medications   carvedilol 25 MG tablet Commonly known as:  COREG Take 0.5 tablets (12.5 mg total) by mouth 2 (two) times daily with a meal. What changed:    how much to take  when to take this   ciprofloxacin 500 MG tablet Commonly known as:  CIPRO Take 500 mg by mouth daily as needed (for UTI).   clobetasol ointment 0.05 % Commonly known as:  TEMOVATE Apply 1 application topically daily as needed (for peeling skin on hands and feet). Reported on 03/17/2015   DULoxetine 30 MG capsule Commonly known as:  CYMBALTA Take 30 mg by mouth at bedtime.   HYDROmorphone 4 MG tablet Commonly known as:  DILAUDID Take 4 mg by mouth 3 (three) times daily as needed for moderate  pain.   insulin aspart 100 UNIT/ML injection Commonly known as:  novoLOG Inject 0-9 Units into the skin 3 (three) times daily before meals. What changed:    how much to take  additional instructions   insulin NPH Human 100 UNIT/ML injection Commonly known as:  HUMULIN N,NOVOLIN N Inject 25-50 Units into the skin See admin instructions. Inject 50 units SQ in the morning, inject 25 units SQ at lunch and inject 40 units SQ at bedtime   lansoprazole 30 MG capsule Commonly known as:  PREVACID Take 30 mg by mouth daily as needed (for acid reflux).   losartan 50 MG tablet Commonly known as:  COZAAR Take 50 mg by mouth at bedtime.   magnesium oxide 400 MG tablet Commonly known as:  MAG-OX Take 800 mg by mouth at bedtime.   methocarbamol 500 MG tablet Commonly known as:  ROBAXIN Take 1 tablet (500 mg total) by mouth every 6 (six) hours as needed for muscle spasms.  nitroGLYCERIN 0.4 MG SL tablet Commonly known as:  NITROSTAT Place 0.4 mg under the tongue every 5 (five) minutes as needed for chest pain.   rivaroxaban 10 MG Tabs tablet Commonly known as:  XARELTO Take 1 tablet (10 mg total) by mouth daily with breakfast for 20 days. Then resume one 81 mg aspirin once a day.            Discharge Care Instructions  (From admission, onward)         Start     Ordered   01/24/18 0000  Weight bearing as tolerated     01/24/18 0738   01/24/18 0000  Change dressing    Comments:  Change dressing on Friday, then change the dressing daily with sterile 4 x 4 inch gauze dressing and apply TED hose.   01/24/18 3507         Follow-up Information    Gaynelle Arabian, MD. Schedule an appointment as soon as possible for a visit on 02/07/2018.   Specialty:  Orthopedic Surgery Contact information: [address] [place] [address] [phone]           Signed: Theresa Duty, PA-C Orthopedic Surgery 01/29/2018, 1:13 PM

## 2018-01-30 ENCOUNTER — Ambulatory Visit: Payer: PPO

## 2018-01-30 DIAGNOSIS — M25561 Pain in right knee: Secondary | ICD-10-CM | POA: Diagnosis not present

## 2018-01-30 DIAGNOSIS — M6281 Muscle weakness (generalized): Secondary | ICD-10-CM

## 2018-01-30 DIAGNOSIS — M25661 Stiffness of right knee, not elsewhere classified: Secondary | ICD-10-CM

## 2018-01-30 DIAGNOSIS — R262 Difficulty in walking, not elsewhere classified: Secondary | ICD-10-CM

## 2018-01-30 IMAGING — DX DG CHEST 1V PORT
1 series · 2 of 2 positions shown · non-contrast
Comparison: CT 03/31/2015 .  03/30/2015.

CLINICAL DATA: Chest tube placement.

EXAM:
PORTABLE CHEST 1 VIEW

[Series 1: chest ap · 0.14mm/px · 2 of 2 slices shown]
[im 1/2]
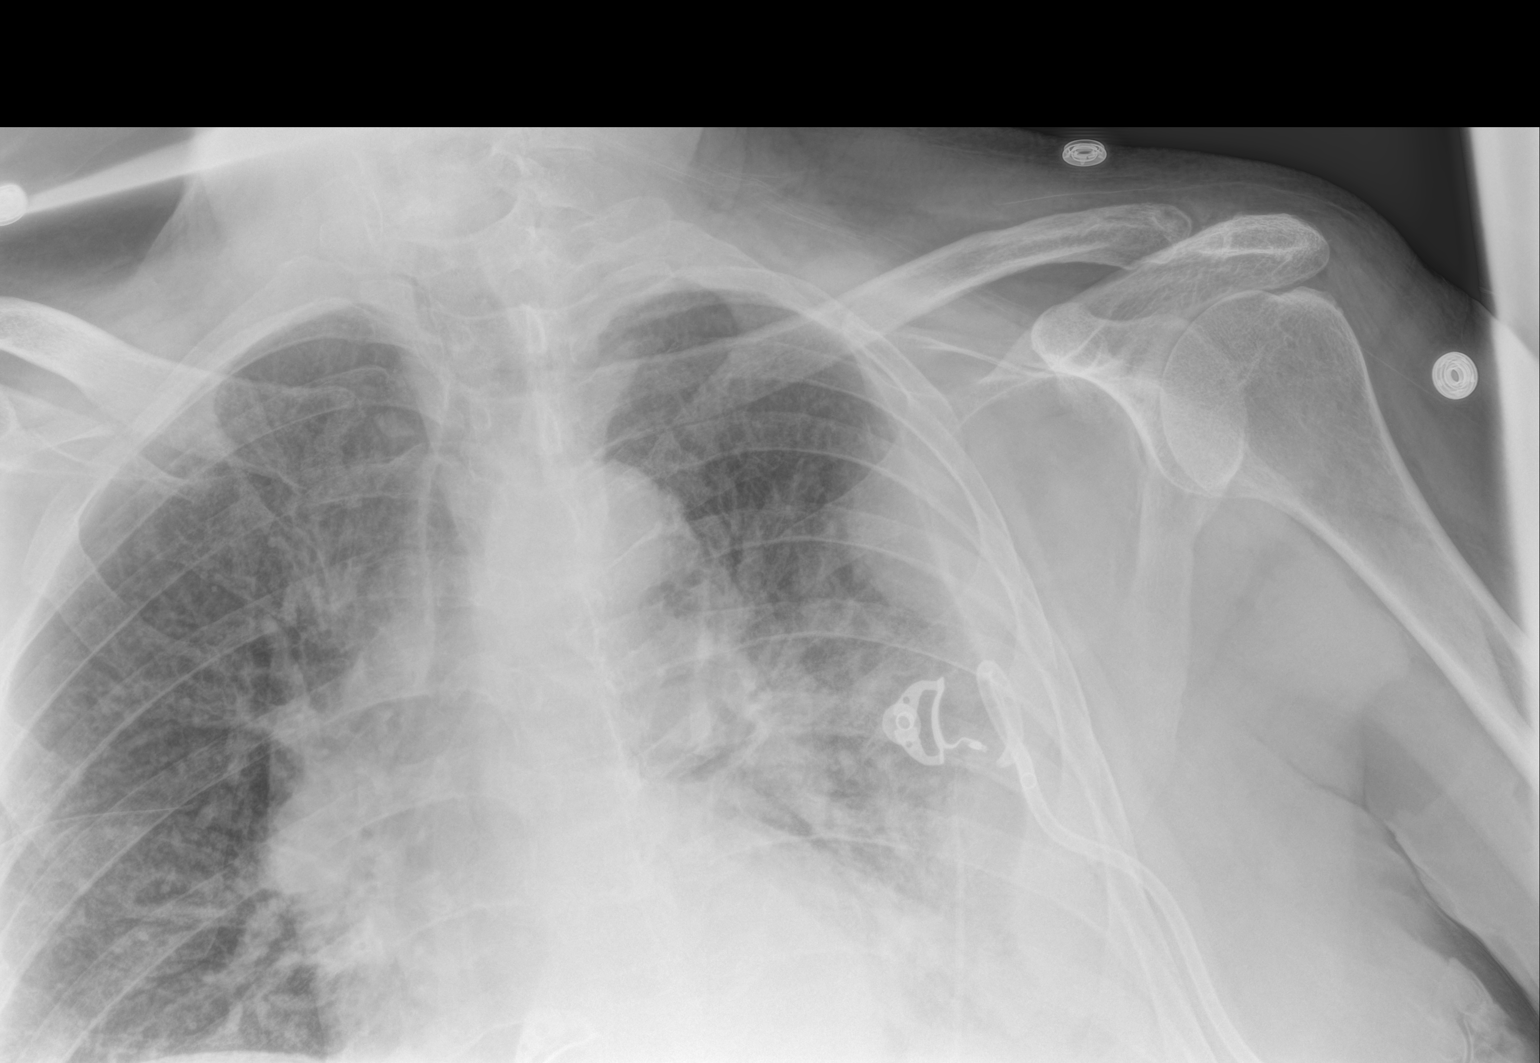
[im 2/2]
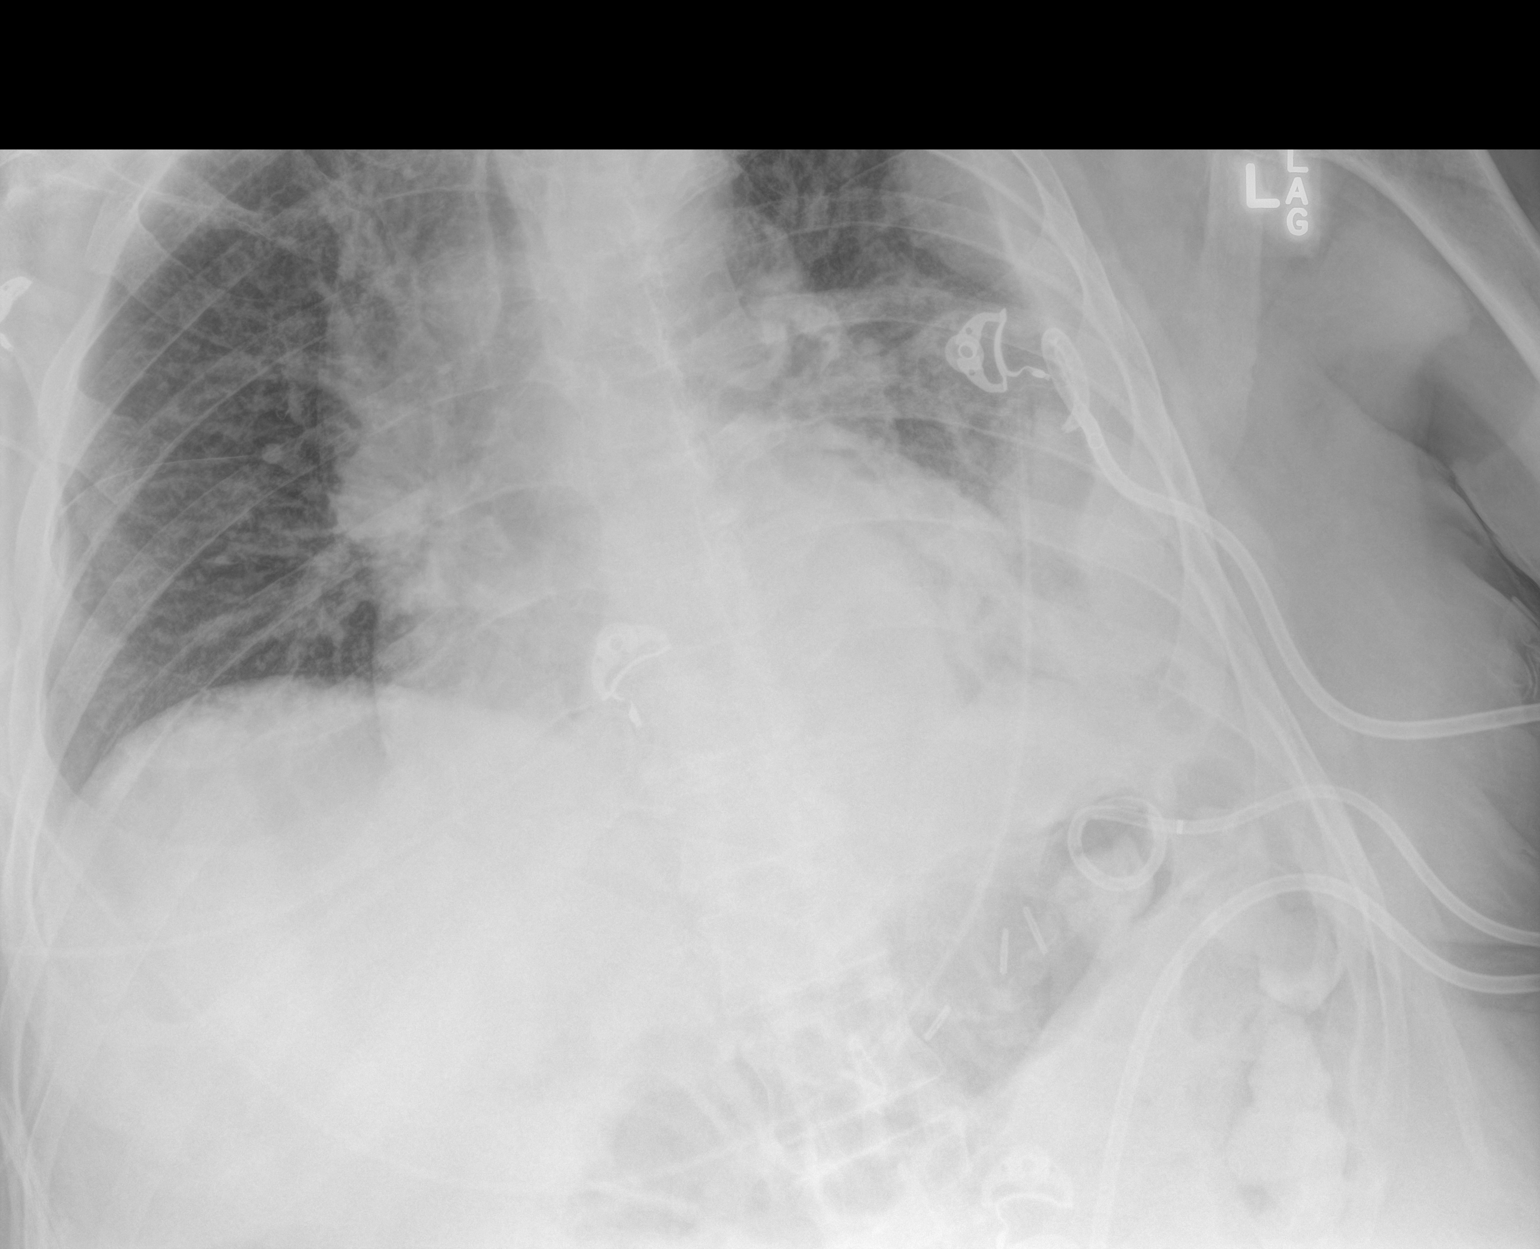

[2 of 2 positions shown; findings below may reference images not displayed]

FINDINGS: Left chest tube in good anatomic position with significant reduction
in large loculated left pleural effusion. No pneumothorax. Pulmonary
apices not completely imaged. Stable cardiomegaly. Low lung volumes
with basilar atelectasis. Surgical clips upper abdomen. Drainage
catheter left upper abdomen.
IMPRESSION: 1. Interim placement of left chest tube with significant reduction
in large loculated left pleural effusion.

2.  Stable cardiomegaly.  Low lung volumes with basilar atelectasis.

3.  Left upper quadrant drainage catheter stable position.

## 2018-01-30 NOTE — Therapy (Signed)
[facility] [address]  [place], [place], [address] Phone: [phone]   Fax:  [phone]  Physical Therapy Treatment  Patient Details  Name: Derek [name] MRN: [id number] Date of Birth: 03/03/1945 Referring Provider (PT): Gaynelle Arabian, MD   Encounter Date: 01/30/2018  PT End of Session - 01/30/18 1459    Visit Number  2    Number of Visits  13    Date for PT Re-Evaluation  02/25/18    Authorization Type  [employer]    PT Start Time  1448    PT Stop Time  1538    PT Time Calculation (min)  50 min    Activity Tolerance  Patient limited by pain;Patient tolerated treatment well    Behavior During Therapy  [facility] for tasks assessed/performed       Past Medical History:  Diagnosis Date  . Anxiety   . Arthritis   . Bilateral leg cramps    takes magnesium  . BPH (benign prostatic hyperplasia)   . DDD (degenerative disc disease), lumbar    gets back injections   . Deafness in right ear    meniere's disease  . Depression   . Dermatitis    bilateral hands  . Factor V Leiden mutation ([place])    "never had blood clots"  . GERD (gastroesophageal reflux disease)   . Hiatal hernia   . History of kidney stones    multiple kidney stones-not a problem at present  . 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 History of panic attacks   . 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 Hypertension   . Incomplete right bundle branch block   . Insulin dependent type 2 diabetes mellitus, uncontrolled ([place]) followed by dr [name] ([place])   dx 2007--- ltype 1 now since jan 2017 surgery with part of pancreas removed  . Lower urinary tract symptoms (LUTS)   . Meniere's disease dx 1970s   intermittant vertigo  . Obstructive sleep apnea     " i used to have sleep apnea" never used a c-pap   . Open wound of abdomen    WET/DRY DRESSING CHANGES TID--- POST ABD. SURGERY 09-21-2016 healed now  . Peripheral vascular disease (HCC)    right leg - decreased pulse   . Wears partial dentures    LOWER  . 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 ANAL FISTULECTOMY N/A 04/01/2013   Procedure: EXAM UNDER ANESTHESIA WITH ANAL FISSURectomy, sphincterotomy and internal hemorrhoidectomy;  Surgeon: Harl Bowie, MD;  Location: [facility];  Service: General;  Laterality: N/A;  . APPLICATION OF WOUND VAC N/A 05/16/2015   Procedure: APPLICATION OF WOUND VAC;  Surgeon: Armandina Gemma, MD;  Location: [facility];  Service: General;  Laterality: N/A;  . APPLICATION OF WOUND VAC N/A 05/20/2015   Procedure: EXHANGE OF ABDOMINAL  WOUND VAC DRESSING;  Surgeon: Armandina Gemma, MD;  Location: [facility];  Service: General;  Laterality: N/A;  . COLONOSCOPY    . CYSTOSCOPY WITH INSERTION OF UROLIFT N/A 10/12/2016   Procedure: CYSTOSCOPY WITH INSERTION OF UROLIFT;  Surgeon: Franchot Gallo, MD;  Location[facility];  Service: Urology;  Laterality: N/A;  . EUS N/A 12/31/2014   Procedure: UPPER ENDOSCOPIC ULTRASOUND (EUS) LINEAR;  Surgeon: Milus Banister, MD;  [facility];  Service: Endoscopy;  Laterality: N/A;  . HARDWARE REMOVAL Left 2013   left ankle  . HEMORRHOIDECTOMY WITH HEMORRHOID BANDING    . INCISION AND DRAINAGE ABSCESS N/A 05/14/2015   Procedure: DRAINAGE OF INFECTED PANCREATIC PSEUDOCYST;  Surgeon: Armandina Gemma, MD;  Location: [facility];  Service: General;  Laterality: N/A;  . INCISION AND DRAINAGE OF WOUND N/A 05/16/2015   Procedure: IRRIGATION AND DEBRIDEMENT WOUND;  Surgeon: Armandina Gemma, MD;  Location: [facility];  Service: General;  Laterality: N/A;  . INCISIONAL HERNIA REPAIR N/A 11/12/2015   Procedure: [place];  Surgeon: Armandina Gemma,  MD;  Location: [facility];  Service: General;  Laterality: N/A;  . INGUINAL HERNIA REPAIR Right 1974;  1982  . INSERTION OF MESH N/A 11/12/2015   Procedure: INSERTION OF MESH;  Surgeon: Armandina Gemma, MD;  Location: [facility];  Service: General;  Laterality: N/A;  . IR GENERIC HISTORICAL  04/20/2015   IR RADIOLOGIST EVAL & MGMT 04/20/2015 [facility] INTERV RAD  . KNEE ARTHROSCOPY Bilateral right 08-08-2000;  left 11-23-2000   meniscal repair and chondroplasty's  . KNEE ARTHROSCOPY  03/01/2012   Procedure: ARTHROSCOPY KNEE;  Surgeon: Gearlean Alf, MD;  Location: [facility];  Service: Orthopedics;  Laterality: Left;  WITH SYNOVECTOMY   . LAPAROTOMY N/A 05/14/2015   Procedure: EXPLORATORY LAPAROTOMY;  Surgeon: Armandina Gemma, MD;  [facility];  Service: General;  Laterality: N/A;  . ORIF LEFT ANKLE FX  08/20/2009   distal tib-fib and malleolus  . ROTATOR CUFF REPAIR Bilateral right 1994; left 1993, left 1993  . TOTAL KNEE ARTHROPLASTY Right 06/02/2013   Procedure: RIGHT TOTAL KNEE ARTHROPLASTY;  Surgeon: Gearlean Alf, MD;  [facility];  Service: Orthopedics;  Laterality: Right;  . TOTAL KNEE ARTHROPLASTY Left 01-31-2008   dr Wynelle Link  . TOTAL KNEE REVISION Right 01/23/2018   Procedure: right knee polyethylene revision;  Surgeon: Gaynelle Arabian, MD;  Location: [facility];  Service: Orthopedics;  Laterality: Right;  34mn  . UMBILICAL HERNIA REPAIR  08-11-1999   dr bNinfa Linden . UPPER GASTROINTESTINAL ENDOSCOPY  sept 2016  . WOUND EXPLORATION N/A 09/21/2016   Procedure: WOUND EXPLORATION AND DEBRIDEMENT ABDOMINAL WALL;  Surgeon: GArmandina Gemma MD;  Location: [facility];  Service: General;  Laterality: N/A;    There were no vitals filed for this visit.  Subjective Assessment - 01/30/18 1454    Subjective  Pt. reporting, "it's hurting today".      Pertinent History  DM, arthritis, hx pancreatic CA with surgery 2017, back pain, hernia repair    Patient Stated Goals  "get up and walk out the door  without any pain"    Currently in Pain?  Yes    Pain Score  5     Pain Location  Knee    Pain Orientation  Right    Pain Type  Acute pain;Surgical pain    Pain Onset  In the past 7 days    Pain Frequency  Intermittent    Aggravating Factors   knee flexion     Multiple Pain Sites  No                       [facility] Adult PT Treatment/Exercise - 01/30/18 1505      Knee/Hip Exercises: Stretches   Passive Hamstring Stretch  Right;1 rep;30 seconds    Passive Hamstring Stretch Limitations  strap       Knee/Hip Exercises: Aerobic   Nustep  Lvl 1, 7 min; UE/LE       Knee/Hip Exercises: Seated   Other Seated Knee/Hip Exercises  R fitter leg press (2 blue bands) x 10 reps    Sit to Sand  5 reps;with UE support   focusing on R LE activation and proper positioning      Knee/Hip Exercises: Supine   Quad Sets  Right;10 reps    Quad Sets Limitations  5" hold     Heel Slides  Right;10 reps    Heel Slides Limitations  with strap assistance     Straight Leg Raises  Right;10 reps    Straight Leg Raises Limitations  Cues for proper motion       Modalities   Modalities  Vasopneumatic      Vasopneumatic   Number Minutes Vasopneumatic   10 minutes    Vasopnuematic Location   Knee    Vasopneumatic Pressure  Medium    Vasopneumatic Temperature   coldest temp.               PT Short Term Goals - 01/30/18 1518      PT SHORT TERM GOAL #1   Title  Patient to be independent with initial HEP.    Time  3    Period  Weeks    Status  On-going        PT Long Term Goals - 01/30/18 1518      PT LONG TERM GOAL #1   Title  Patient to be independent with advanced HEP.    Time  4    Period  Weeks    Status  On-going      PT LONG TERM GOAL #2   Title  Patient to demonstrate R knee AROM/PROM symmetrical to opposite knee.     Time  4    Period  Weeks    Status  On-going      PT LONG TERM GOAL #3   Title  Patient to demonstrate >=4+/5 strength in B LEs.     Time  4     Period  Weeks    Status  On-going      PT LONG TERM GOAL #4   Title  Patient to demonstrate symmetrical step length, weight shift, and TKE with ambulation with LRAD.    Time  4    Period  Weeks    Status  On-going            Plan - 01/30/18 1500    Clinical Impression Statement  Pt. reporting no issues with HEP.  Did report, "I replaced my Steri Strips because they looked scrappy".  Pt. strongly encouraged not to replace Steri Strips himself to keep incision from being compromised this soon after surgery.  pt. tolerated all gentle ROM and strengthening activities well today in session.  Ended with ice/compression to R knee to reduce post-exercise swelling and pain.  Will continue to progress.      Rehab Potential  Good    PT Frequency  3x / week    PT Duration  4 weeks    PT Treatment/Interventions  ADLs/Self Care Home Management;Cryotherapy;Moist Heat;Ultrasound;DME Instruction;Gait training;Stair training;Functional mobility training;Therapeutic activities;Therapeutic exercise;Manual techniques;Patient/family education;Electrical Stimulation;Neuromuscular re-education;Balance training;Scar mobilization;Passive range of motion;Dry needling;Energy conservation;Taping;Vasopneumatic Device;Splinting    Consulted and Agree with Plan of Care  Patient  Patient will benefit from skilled therapeutic intervention in order to improve the following deficits and impairments:  Decreased endurance, Increased edema, Decreased scar mobility, Decreased activity tolerance, Decreased strength, Pain, Difficulty walking, Decreased mobility, Decreased balance, Decreased range of motion, Improper body mechanics, Postural dysfunction, Impaired flexibility  Visit Diagnosis: Acute pain of right knee  Stiffness of right knee, not elsewhere classified  Muscle weakness (generalized)  Difficulty in walking, not elsewhere classified     Problem List Patient Active Problem List   Diagnosis Date Noted   . 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 Foreign body of abdominal wall 09/04/2016  . Ventral incisional hernia 11/12/2015  . Incisional hernia 11/10/2015  . 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 Pleural effusion on left   . Lactose intolerance in adult 04/04/2015  . Protein-calorie malnutrition, moderate ([place]) 04/04/2015  . Pleural effusion, left   . Debility   . Benign essential HTN   . Leukocytosis   . Thrombocytosis ([place])   . Intra-abdominal abscess ([place])   . Poorly controlled type 2 diabetes mellitus ([place])   . 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 OA (osteoarthritis) of knee 06/02/2013  . Anal fissure 02/04/2013  . Meniere's disease   . GERD (gastroesophageal reflux disease)   . Kidney stone   . Obstructive sleep apnea   . Morbid obesity ([place])   . Synovitis of knee 03/01/2012  . Chronic cough 11/03/2011  . COSTOCHONDRITIS, LEFT 10/08/2009  . RIB PAIN, LEFT SIDED 10/08/2009    Bess Harvest, PTA 01/30/18 6:22 PM   [facility] [address]  [place], [place], [address] Phone: [phone]   Fax:  [phone]  Name: BOYSIE LATINA MRN: [id number] Date of Birth: 1945/07/24

## 2018-01-31 ENCOUNTER — Ambulatory Visit: Payer: PPO | Admitting: Rehabilitative and Restorative Service Providers"

## 2018-02-04 ENCOUNTER — Ambulatory Visit: Payer: PPO | Admitting: Physical Therapy

## 2018-02-04 ENCOUNTER — Encounter: Payer: Self-pay | Admitting: Physical Therapy

## 2018-02-04 DIAGNOSIS — M6281 Muscle weakness (generalized): Secondary | ICD-10-CM

## 2018-02-04 DIAGNOSIS — M25561 Pain in right knee: Secondary | ICD-10-CM

## 2018-02-04 DIAGNOSIS — R262 Difficulty in walking, not elsewhere classified: Secondary | ICD-10-CM

## 2018-02-04 DIAGNOSIS — M25661 Stiffness of right knee, not elsewhere classified: Secondary | ICD-10-CM

## 2018-02-04 NOTE — Therapy (Addendum)
[facility] [address]  [place], [place], [address] Phone: [phone]   Fax:  [phone]  Physical Therapy Treatment  Patient Details  Name: Derek [name] MRN: [id number] Date of Birth: 01-01-1946 Referring Provider (PT): Gaynelle Arabian, MD   Progress Note Reporting Period 01/28/18 to 02/04/18  See note below for Objective Data and Assessment of Progress/Goals.    Encounter Date: 02/04/2018  PT End of Session - 02/04/18 1533    Visit Number  3    Number of Visits  13    Date for PT Re-Evaluation  02/25/18    Authorization Type  [employer]    PT Start Time  1446    PT Stop Time  1530    PT Time Calculation (min)  44 min    Activity Tolerance  Patient limited by pain;Patient tolerated treatment well    Behavior During Therapy  [facility] for tasks assessed/performed       Past Medical History:  Diagnosis Date  . Anxiety   . Arthritis   . Bilateral leg cramps    takes magnesium  . BPH (benign prostatic hyperplasia)   . DDD (degenerative disc disease), lumbar    gets back injections   . Deafness in right ear    meniere's disease  . Depression   . Dermatitis    bilateral hands  . Factor V Leiden mutation ([place])    "never had blood clots"  . GERD (gastroesophageal reflux disease)   . Hiatal hernia   . History of kidney stones    multiple kidney stones-not a problem at present  . 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 History of panic attacks   . 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 Hypertension   . Incomplete right bundle branch block   . Insulin dependent type 2 diabetes mellitus, uncontrolled ([place]) followed by dr [name] ([place])   dx 2007--- ltype 1 now since jan 2017 surgery with part of pancreas  removed  . Lower urinary tract symptoms (LUTS)   . Meniere's disease dx 1970s   intermittant vertigo  . Obstructive sleep apnea    " i used to have sleep apnea" never used a c-pap   . Open wound of abdomen    WET/DRY DRESSING CHANGES TID--- POST ABD. SURGERY 09-21-2016 healed now  . Peripheral vascular disease (HCC)    right leg - decreased pulse   . Wears partial dentures    LOWER  . 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 ANAL FISTULECTOMY N/A 04/01/2013   Procedure: EXAM UNDER ANESTHESIA WITH ANAL FISSURectomy, sphincterotomy and internal hemorrhoidectomy;  Surgeon: Harl Bowie, MD;  Location: [facility];  Service: General;  Laterality: N/A;  . APPLICATION OF WOUND VAC N/A 05/16/2015   Procedure: APPLICATION OF WOUND VAC;  Surgeon: Armandina Gemma, MD;  [facility];  Service: General;  Laterality: N/A;  . APPLICATION OF WOUND VAC N/A 05/20/2015   Procedure: EXHANGE OF ABDOMINAL  WOUND VAC DRESSING;  Surgeon: Armandina Gemma, MD;  [facility];  Service: General;  Laterality: N/A;  . COLONOSCOPY    . CYSTOSCOPY WITH INSERTION OF UROLIFT N/A 10/12/2016  Procedure: CYSTOSCOPY WITH INSERTION OF UROLIFT;  Surgeon: Franchot Gallo, MD;  Location: [facility];  Service: Urology;  Laterality: N/A;  . EUS N/A 12/31/2014   Procedure: UPPER ENDOSCOPIC ULTRASOUND (EUS) LINEAR;  Surgeon: Milus Banister, MD;  [facility];  Service: Endoscopy;  Laterality: N/A;  . HARDWARE REMOVAL Left 2013   left ankle  . HEMORRHOIDECTOMY WITH HEMORRHOID BANDING    . INCISION AND DRAINAGE ABSCESS N/A 05/14/2015   Procedure: DRAINAGE OF INFECTED PANCREATIC PSEUDOCYST;  Surgeon: Armandina Gemma, MD;  [facility];  Service: General;  Laterality: N/A;  . INCISION AND DRAINAGE OF WOUND N/A 05/16/2015   Procedure: IRRIGATION AND DEBRIDEMENT WOUND;  Surgeon: Armandina Gemma, MD;  [facility];  Service: General;   Laterality: N/A;  . INCISIONAL HERNIA REPAIR N/A 11/12/2015   Procedure: [place];  Surgeon: Armandina Gemma, MD;  Location: [facility];  Service: General;  Laterality: N/A;  . INGUINAL HERNIA REPAIR Right 1974;  1982  . INSERTION OF MESH N/A 11/12/2015   Procedure: INSERTION OF MESH;  Surgeon: Armandina Gemma, MD;  Location: [facility];  Service: General;  Laterality: N/A;  . IR GENERIC HISTORICAL  04/20/2015   IR RADIOLOGIST EVAL & MGMT 04/20/2015 [facility] INTERV RAD  . KNEE ARTHROSCOPY Bilateral right 08-08-2000;  left 11-23-2000   meniscal repair and chondroplasty's  . KNEE ARTHROSCOPY  03/01/2012   Procedure: ARTHROSCOPY KNEE;  Surgeon: Gearlean Alf, MD;  Location: [facility];  Service: Orthopedics;  Laterality: Left;  WITH SYNOVECTOMY   . LAPAROTOMY N/A 05/14/2015   Procedure: EXPLORATORY LAPAROTOMY;  Surgeon: Armandina Gemma, MD;  [facility];  Service: General;  Laterality: N/A;  . ORIF LEFT ANKLE FX  08/20/2009   distal tib-fib and malleolus  . ROTATOR CUFF REPAIR Bilateral right 1994; left 1993, left 1993  . TOTAL KNEE ARTHROPLASTY Right 06/02/2013   Procedure: RIGHT TOTAL KNEE ARTHROPLASTY;  Surgeon: Gearlean Alf, MD;  [facility];  Service: Orthopedics;  Laterality: Right;  . TOTAL KNEE ARTHROPLASTY Left 01-31-2008   dr Wynelle Link  . TOTAL KNEE REVISION Right 01/23/2018   Procedure: right knee polyethylene revision;  Surgeon: Gaynelle Arabian, MD;  [facility];  Service: Orthopedics;  Laterality: Right;  1mn  . UMBILICAL HERNIA REPAIR  08-11-1999   dr bNinfa Linden . UPPER GASTROINTESTINAL ENDOSCOPY  sept 2016  . WOUND EXPLORATION N/A 09/21/2016   Procedure: WOUND EXPLORATION AND DEBRIDEMENT ABDOMINAL WALL;  Surgeon: GArmandina Gemma MD;  [facility];  Service: General;  Laterality: N/A;    There were no vitals filed for this visit.  Subjective Assessment - 02/04/18 1450    Subjective  Reports heel slides are his most difficult exercise. Has been  walking every night.     Pertinent History  DM, arthritis, hx pancreatic CA with surgery 2017, back pain, hernia repair    Diagnostic tests  none recent    Patient Stated Goals  "get up and walk out the door without any pain"    Currently in Pain?  Yes    Pain Score  4     Pain Location  Knee    Pain Orientation  Right    Pain Descriptors / Indicators  Burning    Pain Type  Acute pain;Surgical pain                       [facility] Adult PT Treatment/Exercise - 02/04/18 0001  Ambulation/Gait   Ambulation Distance (Feet)  180 Feet    Assistive device  Rolling walker;Straight cane    Gait Pattern  Step-to pattern;Decreased hip/knee flexion - right;Decreased weight shift to left;Decreased stance time - right;Decreased step length - left;Right flexed knee in stance    Ambulation Surface  Level;Indoor    Gait velocity  decreased    Gait Comments  cues for TKE at R heel strike and upright trunk       Knee/Hip Exercises: Stretches   Passive Hamstring Stretch  Right;2 reps;30 seconds    Passive Hamstring Stretch Limitations  supine strap    Hip Flexor Stretch  Right;2 reps;20 seconds;Limitations    Hip Flexor Stretch Limitations  mod thomas with strap to tolerance      Knee/Hip Exercises: Aerobic   Nustep  Lvl 1, 6 min; UE/LE       Knee/Hip Exercises: Seated   Sit to Sand  5 reps;1 set;without UE support   Cues for anterior trunk lean, foam pad under bottom     Knee/Hip Exercises: Supine   Quad Sets  Right;10 reps    Quad Sets Limitations  10x10" will towel roll    Heel Slides  Right;10 reps    Heel Slides Limitations  with strap assistance on orange pball    Straight Leg Raises  Right;10 reps    Straight Leg Raises Limitations  cues for quad set before each rep      Manual Therapy   Manual Therapy  Joint mobilization    Joint Mobilization  R knee patellar mobs in all directions- good motion M/L, unable to tolerate d/t pain with superior/inferior directions                PT Short Term Goals - 02/04/18 1537      PT SHORT TERM GOAL #1   Title  Patient to be independent with initial HEP.    Time  3    Period  Weeks    Status  Achieved        PT Long Term Goals - 01/30/18 1518      PT LONG TERM GOAL #1   Title  Patient to be independent with advanced HEP.    Time  4    Period  Weeks    Status  On-going      PT LONG TERM GOAL #2   Title  Patient to demonstrate R knee AROM/PROM symmetrical to opposite knee.     Time  4    Period  Weeks    Status  On-going      PT LONG TERM GOAL #3   Title  Patient to demonstrate >=4+/5 strength in B LEs.     Time  4    Period  Weeks    Status  On-going      PT LONG TERM GOAL #4   Title  Patient to demonstrate symmetrical step length, weight shift, and TKE with ambulation with LRAD.    Time  4    Period  Weeks    Status  On-going            Plan - 02/04/18 1534    Clinical Impression Statement  Patient arrived to session with no new complaints. Patient with visible red rash surrounding R knee incision- patient reporting that this is d/t him ripping his bandaids off of his knee. Performed R knee patellar mobs in all directions- good motion M/L, unable to tolerate d/t pain with superior/inferior directions.  Educated patient on desensitization of scar for better tolerance of palpation. Patient showing good effort with all ther-ex today, tolerating all exercises despite R knee pain/discomfort. Demonstrated good ROM with heel slides using physioball. Attempted to perform prone quad stretch, however patient unable to tolerate prone positioning. Better tolerance of modified Thomas stretch in supine. Ended session with gait training with SPC and with rolling walker- decreased R knee flexion noted when using SPC, better kick through with walker. Advised patient to use SPC when inside, walker out in the community. Patient reported understanding. Offered patient modalities at end of session for pain  relief, which he declined.     PT Treatment/Interventions  ADLs/Self Care Home Management;Cryotherapy;Moist Heat;Ultrasound;DME Instruction;Gait training;Stair training;Functional mobility training;Therapeutic activities;Therapeutic exercise;Manual techniques;Patient/family education;Electrical Stimulation;Neuromuscular re-education;Balance training;Scar mobilization;Passive range of motion;Dry needling;Energy conservation;Taping;Vasopneumatic Device;Splinting    PT Next Visit Plan  progress LE strengthening, gait training SPC    Consulted and Agree with Plan of Care  Patient       Patient will benefit from skilled therapeutic intervention in order to improve the following deficits and impairments:  Decreased endurance, Increased edema, Decreased scar mobility, Decreased activity tolerance, Decreased strength, Pain, Difficulty walking, Decreased mobility, Decreased balance, Decreased range of motion, Improper body mechanics, Postural dysfunction, Impaired flexibility  Visit Diagnosis: Acute pain of right knee  Stiffness of right knee, not elsewhere classified  Muscle weakness (generalized)  Difficulty in walking, not elsewhere classified     Problem List Patient Active Problem List   Diagnosis Date Noted  . 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 Foreign body of abdominal wall 09/04/2016  . Ventral incisional hernia 11/12/2015  . Incisional hernia 11/10/2015  . 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 Pleural effusion on left   . Lactose intolerance in adult 04/04/2015  . Protein-calorie malnutrition, moderate ([place]) 04/04/2015  . Pleural effusion, left   . Debility   . Benign essential HTN   . Leukocytosis   . Thrombocytosis ([place])   . Intra-abdominal abscess ([place])   . Poorly controlled type 2 diabetes mellitus ([place])   . 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 OA (osteoarthritis) of knee 06/02/2013  .  Anal fissure 02/04/2013  . Meniere's disease   . GERD (gastroesophageal reflux disease)   . Kidney stone   . Obstructive sleep apnea   . Morbid obesity ([place])   . Synovitis of knee 03/01/2012  . Chronic cough 11/03/2011  . COSTOCHONDRITIS, LEFT 10/08/2009  . RIB PAIN, LEFT SIDED 10/08/2009    Janene Harvey, PT, DPT 02/04/18 3:38 PM   [facility] [address]  [place], [place], [address] Phone: [phone]   Fax:  [phone]  Name: ASHTAN GIRTMAN MRN: [id number] Date of Birth: 1945/05/27   PHYSICAL THERAPY DISCHARGE SUMMARY  Visits from Start of Care: 3  Current functional level related to goals / functional outcomes: Unable to assess; patient acquired infection in incision and placed on hold with PT   Remaining deficits: Unable to assess   Education / Equipment: HEP  Plan: Patient agrees to discharge.  Patient goals were not met. Patient is being discharged due to the physician's request.  ?????     Janene Harvey, PT, DPT 03/12/18 10:41 AM

## 2018-02-06 ENCOUNTER — Ambulatory Visit: Payer: PPO | Admitting: Physical Therapy

## 2018-02-07 ENCOUNTER — Ambulatory Visit: Payer: PPO

## 2018-02-07 NOTE — Therapy (Addendum)
[facility] [address]  [place], [place], [address] Phone: [phone]   Fax:  [phone]  Patient Details  Name: Derek [name] MRN: [id number] Date of Birth: 1945-03-06 Referring Provider:  Derl Barrow, [place]  Encounter Date: 02/07/2018  Subjective:  Pt. and daughter seen in waiting room today before session reporting concern over their belief of possible R knee infection signs.  Pt. R knee with visible bright red complexion and with excessive swelling which extended into mid shin.  Pt. R knee surrounding skin hot to touch surrounding incision.  Pt. reporting he has been feeling "burning" sensation at R LE all day today.  Pt. reporting he has had "fluid" draining from R incision which started Tuesday night in bed which left large wet spot on sheets.  Pt. noting he had episode of incontinence Tuesday night in bed and threw up.  Pt. and daughter reporting they have not yet contacted MD regarding change in R knee status.  Pt. left without being seen today and strongly encouraged to try and be seen by MD as soon as possible.  Supervising PT contacted MD office to inform of pt. status today at ~ 3:45pm.       Bess Harvest, PTA 02/07/18 4:22 PM   [facility] [address]  [place], [place], [address] Phone: [phone]   Fax:  [phone]

## 2018-02-08 MED FILL — CEPHALEXIN 500 MG CAPSULE: 500 | 10 days supply | Qty: 40 | Fill #0

## 2018-02-11 ENCOUNTER — Ambulatory Visit: Payer: PPO

## 2018-02-12 ENCOUNTER — Ambulatory Visit: Payer: PPO | Admitting: Physical Therapy

## 2018-02-14 ENCOUNTER — Ambulatory Visit: Payer: PPO | Admitting: Physical Therapy

## 2018-02-15 MED FILL — HYDROmorphone HCL 4 MG TABS: 4 | 20 days supply | Qty: 60 | Fill #0

## 2018-02-18 DIAGNOSIS — E1065 Type 1 diabetes mellitus with hyperglycemia: Secondary | ICD-10-CM | POA: Diagnosis not present

## 2018-02-21 ENCOUNTER — Ambulatory Visit: Payer: PPO | Admitting: Physical Therapy

## 2018-02-25 MED FILL — CARVEDILOL 25 MG TABLET: 25 | 90 days supply | Qty: 180 | Fill #1

## 2018-02-25 MED FILL — DULoxetine HCL 30 MG CPEP: 30 | 30 days supply | Qty: 30 | Fill #0

## 2018-03-01 DIAGNOSIS — G894 Chronic pain syndrome: Secondary | ICD-10-CM | POA: Diagnosis not present

## 2018-03-01 DIAGNOSIS — Z79891 Long term (current) use of opiate analgesic: Secondary | ICD-10-CM | POA: Diagnosis not present

## 2018-03-01 DIAGNOSIS — Z79899 Other long term (current) drug therapy: Secondary | ICD-10-CM | POA: Diagnosis not present

## 2018-03-01 DIAGNOSIS — M545 Low back pain: Secondary | ICD-10-CM | POA: Diagnosis not present

## 2018-03-05 DIAGNOSIS — Z5181 Encounter for therapeutic drug level monitoring: Secondary | ICD-10-CM | POA: Diagnosis not present

## 2018-03-05 DIAGNOSIS — Z79899 Other long term (current) drug therapy: Secondary | ICD-10-CM | POA: Diagnosis not present

## 2018-03-06 MED FILL — NovoLOG 100 UNIT/ML SOLN: 100 | 42 days supply | Qty: 20 | Fill #4

## 2018-03-08 DIAGNOSIS — E1169 Type 2 diabetes mellitus with other specified complication: Secondary | ICD-10-CM | POA: Diagnosis not present

## 2018-03-08 DIAGNOSIS — F339 Major depressive disorder, recurrent, unspecified: Secondary | ICD-10-CM | POA: Diagnosis not present

## 2018-03-08 DIAGNOSIS — E7800 Pure hypercholesterolemia, unspecified: Secondary | ICD-10-CM | POA: Diagnosis not present

## 2018-03-08 DIAGNOSIS — E119 Type 2 diabetes mellitus without complications: Secondary | ICD-10-CM | POA: Diagnosis not present

## 2018-03-08 DIAGNOSIS — I10 Essential (primary) hypertension: Secondary | ICD-10-CM | POA: Diagnosis not present

## 2018-03-11 DIAGNOSIS — K219 Gastro-esophageal reflux disease without esophagitis: Secondary | ICD-10-CM | POA: Diagnosis not present

## 2018-03-11 DIAGNOSIS — F339 Major depressive disorder, recurrent, unspecified: Secondary | ICD-10-CM | POA: Diagnosis not present

## 2018-03-11 DIAGNOSIS — Z0000 Encounter for general adult medical examination without abnormal findings: Secondary | ICD-10-CM | POA: Diagnosis not present

## 2018-03-11 DIAGNOSIS — E7800 Pure hypercholesterolemia, unspecified: Secondary | ICD-10-CM | POA: Diagnosis not present

## 2018-03-11 DIAGNOSIS — I10 Essential (primary) hypertension: Secondary | ICD-10-CM | POA: Diagnosis not present

## 2018-03-11 DIAGNOSIS — N390 Urinary tract infection, site not specified: Secondary | ICD-10-CM | POA: Diagnosis not present

## 2018-03-11 DIAGNOSIS — E1169 Type 2 diabetes mellitus with other specified complication: Secondary | ICD-10-CM | POA: Diagnosis not present

## 2018-03-11 MED FILL — DULOXETINE HCL 60 MG CPEP: 60 | 90 days supply | Qty: 90 | Fill #0

## 2018-03-11 MED FILL — LOSARTAN POTASSIUM 50 MG TA: 50 | 90 days supply | Qty: 90 | Fill #0

## 2018-03-11 MED FILL — CIPROFLOXACIN HCL 500 MG TA: 500 | 30 days supply | Qty: 60 | Fill #0

## 2018-03-13 MED FILL — HYDROmorphone HCL 4 MG TABS: 4 | 20 days supply | Qty: 60 | Fill #0

## 2018-04-02 DIAGNOSIS — R3914 Feeling of incomplete bladder emptying: Secondary | ICD-10-CM | POA: Diagnosis not present

## 2018-04-09 DIAGNOSIS — E1065 Type 1 diabetes mellitus with hyperglycemia: Secondary | ICD-10-CM | POA: Diagnosis not present

## 2018-04-09 DIAGNOSIS — I10 Essential (primary) hypertension: Secondary | ICD-10-CM | POA: Diagnosis not present

## 2018-04-09 DIAGNOSIS — R809 Proteinuria, unspecified: Secondary | ICD-10-CM | POA: Diagnosis not present

## 2018-04-09 MED FILL — HYDROmorphone HCL 4 MG TABS: 4 | 20 days supply | Qty: 60 | Fill #0

## 2018-04-11 MED FILL — ULTCARE INS SYR 1 ML 31GX5/: 31G X 5/16" | 34 days supply | Qty: 200 | Fill #4

## 2018-04-12 DIAGNOSIS — R609 Edema, unspecified: Secondary | ICD-10-CM | POA: Diagnosis not present

## 2018-04-12 MED FILL — FUROSEMIDE 20 MG TAB: 20 | 30 days supply | Qty: 30 | Fill #0

## 2018-04-24 MED FILL — LANSOPRAZOLE DR 30 MG CAPSU: 30 | 90 days supply | Qty: 90 | Fill #0

## 2018-04-26 MED FILL — HYDROmorphone HCL 8 MG TABS: 8 | 30 days supply | Qty: 90 | Fill #0

## 2018-05-06 MED FILL — NovoLOG 100 UNIT/ML SOLN: 100 | 42 days supply | Qty: 20 | Fill #5

## 2018-05-08 DIAGNOSIS — R3914 Feeling of incomplete bladder emptying: Secondary | ICD-10-CM | POA: Diagnosis not present

## 2018-05-09 DIAGNOSIS — M189 Osteoarthritis of first carpometacarpal joint, unspecified: Secondary | ICD-10-CM | POA: Diagnosis not present

## 2018-05-09 DIAGNOSIS — I10 Essential (primary) hypertension: Secondary | ICD-10-CM | POA: Diagnosis not present

## 2018-05-09 DIAGNOSIS — E1065 Type 1 diabetes mellitus with hyperglycemia: Secondary | ICD-10-CM | POA: Diagnosis not present

## 2018-05-09 DIAGNOSIS — E1169 Type 2 diabetes mellitus with other specified complication: Secondary | ICD-10-CM | POA: Diagnosis not present

## 2018-05-09 DIAGNOSIS — R809 Proteinuria, unspecified: Secondary | ICD-10-CM | POA: Diagnosis not present

## 2018-05-09 DIAGNOSIS — E1165 Type 2 diabetes mellitus with hyperglycemia: Secondary | ICD-10-CM | POA: Diagnosis not present

## 2018-05-09 DIAGNOSIS — F339 Major depressive disorder, recurrent, unspecified: Secondary | ICD-10-CM | POA: Diagnosis not present

## 2018-05-09 DIAGNOSIS — E119 Type 2 diabetes mellitus without complications: Secondary | ICD-10-CM | POA: Diagnosis not present

## 2018-05-16 MED FILL — FUROSEMIDE 20 MG TAB: 20 | 30 days supply | Qty: 30 | Fill #1

## 2018-06-10 DIAGNOSIS — E1065 Type 1 diabetes mellitus with hyperglycemia: Secondary | ICD-10-CM | POA: Diagnosis not present

## 2018-06-11 DIAGNOSIS — M47816 Spondylosis without myelopathy or radiculopathy, lumbar region: Secondary | ICD-10-CM | POA: Diagnosis not present

## 2018-06-19 MED FILL — HYDROmorphone HCL 8 MG TABS: 8 | 30 days supply | Qty: 90 | Fill #0

## 2018-06-26 MED FILL — LOSARTAN POTASSIUM 50 MG TA: 50 | 90 days supply | Qty: 90 | Fill #1

## 2018-07-03 MED FILL — NovoLOG 100 UNIT/ML SOLN: 100 | 42 days supply | Qty: 20 | Fill #0

## 2018-07-09 DIAGNOSIS — M545 Low back pain: Secondary | ICD-10-CM | POA: Diagnosis not present

## 2018-07-09 DIAGNOSIS — G894 Chronic pain syndrome: Secondary | ICD-10-CM | POA: Diagnosis not present

## 2018-07-09 DIAGNOSIS — Z79891 Long term (current) use of opiate analgesic: Secondary | ICD-10-CM | POA: Diagnosis not present

## 2018-07-10 DIAGNOSIS — E1065 Type 1 diabetes mellitus with hyperglycemia: Secondary | ICD-10-CM | POA: Diagnosis not present

## 2018-07-15 MED FILL — LANSOPRAZOLE DR 30 MG CAPSU: 30 | 90 days supply | Qty: 90 | Fill #1

## 2018-07-16 MED FILL — CIPROFLOXACIN HCL 500 MG TA: 500 | 30 days supply | Qty: 60 | Fill #1

## 2018-07-18 MED FILL — ULTCARE INS SYR 1 ML 31GX5/: 31G X 5/16" | 30 days supply | Qty: 200 | Fill #0

## 2018-07-25 DIAGNOSIS — R3914 Feeling of incomplete bladder emptying: Secondary | ICD-10-CM | POA: Diagnosis not present

## 2018-07-26 MED FILL — HYDROmorphone HCL 8 MG TABS: 8 | 30 days supply | Qty: 90 | Fill #0

## 2018-08-08 DIAGNOSIS — I10 Essential (primary) hypertension: Secondary | ICD-10-CM | POA: Diagnosis not present

## 2018-08-08 DIAGNOSIS — R809 Proteinuria, unspecified: Secondary | ICD-10-CM | POA: Diagnosis not present

## 2018-08-08 DIAGNOSIS — E1065 Type 1 diabetes mellitus with hyperglycemia: Secondary | ICD-10-CM | POA: Diagnosis not present

## 2018-08-12 DIAGNOSIS — E1065 Type 1 diabetes mellitus with hyperglycemia: Secondary | ICD-10-CM | POA: Diagnosis not present

## 2018-08-12 MED FILL — NovoLOG 100 UNIT/ML SOLN: 100 | 42 days supply | Qty: 20 | Fill #1

## 2018-08-19 DIAGNOSIS — Z9889 Other specified postprocedural states: Secondary | ICD-10-CM | POA: Diagnosis not present

## 2018-08-19 DIAGNOSIS — R1012 Left upper quadrant pain: Secondary | ICD-10-CM | POA: Diagnosis not present

## 2018-08-21 DIAGNOSIS — N401 Enlarged prostate with lower urinary tract symptoms: Secondary | ICD-10-CM | POA: Diagnosis not present

## 2018-08-21 DIAGNOSIS — N3000 Acute cystitis without hematuria: Secondary | ICD-10-CM | POA: Diagnosis not present

## 2018-08-21 DIAGNOSIS — R3915 Urgency of urination: Secondary | ICD-10-CM | POA: Diagnosis not present

## 2018-08-27 ENCOUNTER — Other Ambulatory Visit: Payer: Self-pay | Admitting: Surgery

## 2018-08-27 DIAGNOSIS — D3A8 Other benign neuroendocrine tumors: Secondary | ICD-10-CM

## 2018-08-28 MED FILL — HYDROmorphone HCL 8 MG TABS: 8 | 30 days supply | Qty: 120 | Fill #0

## 2018-09-05 ENCOUNTER — Other Ambulatory Visit: Payer: Self-pay

## 2018-09-05 ENCOUNTER — Ambulatory Visit
Admission: RE | Admit: 2018-09-05 | Discharge: 2018-09-05 | Disposition: A | Discharge status: Home / Self Care | Payer: PPO | Source: Ambulatory Visit | Attending: Surgery | Admitting: Surgery

## 2018-09-05 DIAGNOSIS — R1012 Left upper quadrant pain: Secondary | ICD-10-CM | POA: Diagnosis not present

## 2018-09-05 DIAGNOSIS — D3A8 Other benign neuroendocrine tumors: Secondary | ICD-10-CM

## 2018-09-05 MED ORDER — IOPAMIDOL (ISOVUE-300) INJECTION 61%
125.0000 mL | Freq: Once | INTRAVENOUS | Status: AC | PRN
  Administered 2018-09-05: 125 mL via INTRAVENOUS

## 2018-09-10 DIAGNOSIS — M545 Low back pain: Secondary | ICD-10-CM | POA: Diagnosis not present

## 2018-09-10 DIAGNOSIS — G894 Chronic pain syndrome: Secondary | ICD-10-CM | POA: Diagnosis not present

## 2018-09-10 MED FILL — predniSONE 10 MG TABS: 10 | 6 days supply | Qty: 21 | Fill #0

## 2018-09-11 DIAGNOSIS — E1065 Type 1 diabetes mellitus with hyperglycemia: Secondary | ICD-10-CM | POA: Diagnosis not present

## 2018-09-19 DIAGNOSIS — M5136 Other intervertebral disc degeneration, lumbar region: Secondary | ICD-10-CM | POA: Diagnosis not present

## 2018-09-24 MED FILL — CARVEDILOL 25 MG TABLET: 25 | 90 days supply | Qty: 90 | Fill #0

## 2018-09-24 MED FILL — LOSARTAN POTASSIUM 50 MG TA: 50 | 90 days supply | Qty: 90 | Fill #2

## 2018-09-27 DIAGNOSIS — M545 Low back pain: Secondary | ICD-10-CM | POA: Diagnosis not present

## 2018-09-30 MED FILL — NovoLOG 100 UNIT/ML SOLN: 100 | 42 days supply | Qty: 20 | Fill #2

## 2018-10-04 MED FILL — HYDROmorphone HCL 8 MG TABS: 8 | 30 days supply | Qty: 120 | Fill #0

## 2018-10-09 ENCOUNTER — Other Ambulatory Visit: Payer: Self-pay

## 2018-10-09 DIAGNOSIS — H52223 Regular astigmatism, bilateral: Secondary | ICD-10-CM | POA: Diagnosis not present

## 2018-10-09 DIAGNOSIS — H04123 Dry eye syndrome of bilateral lacrimal glands: Secondary | ICD-10-CM | POA: Diagnosis not present

## 2018-10-09 DIAGNOSIS — H01001 Unspecified blepharitis right upper eyelid: Secondary | ICD-10-CM | POA: Diagnosis not present

## 2018-10-09 DIAGNOSIS — H25813 Combined forms of age-related cataract, bilateral: Secondary | ICD-10-CM | POA: Diagnosis not present

## 2018-10-09 DIAGNOSIS — E109 Type 1 diabetes mellitus without complications: Secondary | ICD-10-CM | POA: Diagnosis not present

## 2018-10-09 DIAGNOSIS — H524 Presbyopia: Secondary | ICD-10-CM | POA: Diagnosis not present

## 2018-10-09 NOTE — Patient Outreach (Signed)
  [facility]) Care Management Chronic Special Needs Program    10/09/2018  Name: Derek [name], DOB: 1945/06/20  MRN: [id number]   Derek [name] is enrolled in a chronic special needs plan for Diabetes. Called for initial assessment. States that he is just leaving to go to his eye appt and to call back another day Plan to call on 10/11/18 to do assessment.  Peter Garter RN, Jackquline Denmark, CDE Chronic Care Management Coordinator [facility] Management [phone]

## 2018-10-11 ENCOUNTER — Other Ambulatory Visit: Payer: Self-pay

## 2018-10-11 NOTE — Patient Outreach (Signed)
[facility]) Care Management Chronic Special Needs Program  10/11/2018  Name: Derek [name] DOB: 07/23/1945  MRN: [id number]  Derek [name] is enrolled in a chronic special needs plan for Diabetes. Chronic Care Management Coordinator telephoned client to review health risk assessment and to develop individualized care plan.  Introduced the chronic care management program, importance of client participation, and taking their care plan to all provider appointments and inpatient facilities.  Reviewed the transition of care process and possible referral to community care management. Client gives permission to speak with his wife Derek [name] Designated Party Release  Subjective: States that client is having difficulty getting his blood sugars lower due to his chronic pain.  States that he is seen by Dr. [name] regularly and she has been adjusting his insulin to help cover his high sugars.  States he wears a [employer] and scans about 4 times a day.  States his sugars range 142-300 with most readings in the 200's.  States he is in the process of getting approval for an insulin pump.  States he really does not watch his diet closely as he feels like it is the only thing he has left that he can enjoy.  States his chronic back pain is followed by Dr. Nelva Bush and his last injection did not help much.  States that Dr.Ramos is recommending a pain stimulator if it can be approved.  States he takes a lot of Dilaudid a day to keep his pain tolerable.  States it makes him unsteady and adds to his forgetfulness.  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    Goals Addressed            This Visit's Progress   . Client understands the importance of follow-up with providers by attending scheduled visits      . Client will report improved coping with diabetes by next 6 months      . Client will report no fall or injuries in the next 3 months.        Please review Check for Safety brochure     . Client will use Assistive Devices as needed and verbalize understanding of device use      . Client will verbalize knowledge of chronic lung disease as evidenced by no ED visits or Inpatient stays related to chronic lung disease       . Client will verbalize knowledge of self management of Hypertension as evidences by BP reading of 140/90 or less; or as defined by provider      . Client/Caregiver will verbalize understanding of instructions related to self-care and safety      . Decrease inpatient admissions/ readmissions with in the next year      . HEMOGLOBIN A1C < 7.0       Diabetes self management actions:  Glucose monitoring per provider recommendations  Eat Healthy  Check feet daily  Visit provider every 3-6 months as directed  Hbg A1C level every 3-6 months.  Eye Exam yearly    . Maintain timely refills of diabetic medication as prescribed within the year .      Marland Kitchen Obtain annual  Lipid Profile, LDL-C      . Obtain Annual Eye (retinal)  Exam       . Obtain Annual Foot Exam      . Obtain annual screen for micro albuminuria (urine) , nephropathy (kidney problems)      .  Obtain Hemoglobin A1C at least 2 times per year      . Visit Primary Care Provider or Endocrinologist at least 2 times per year        Client is not meeting diabetes self management goal of hemoglobin A1C of <7% with last reading of 10.1% Instructed to continue to work with Dr.Balan to adjust his insulin Instructed that when he gets his insulin pump they will need to be able to count his CHO to dose his insulin Instructed to request a referral to a dietitian to get more education on CHO counting.  Wife agreeable to doing an EMMI education on-line for CHO counting Reviewed s/s of hypoglycemia and the rule of 15 to treat hypoglycemia Discussed that client is on the [facility] eligible list and encouraged to become engaged with [facility] when they make outreach to him.    Reviewed number for 24-hour nurse Line Discussed COVID19 cause, symptoms, precautions (social distancing, stay at home order, hand washing), confirmed client knows how to contact provider. Plan:  Send successful outreach letter with a copy of their individualized care plan, Send individual care plan to provider and Send educational material- CHO counting, HTN, memory loss  Chronic care management coordination will outreach in:  3 Months     Peter Garter RN, [facility], [place] Management Coordinator [place] Management [phone]

## 2018-10-14 DIAGNOSIS — E1065 Type 1 diabetes mellitus with hyperglycemia: Secondary | ICD-10-CM | POA: Diagnosis not present

## 2018-10-16 ENCOUNTER — Other Ambulatory Visit: Payer: Self-pay

## 2018-10-16 MED FILL — LANSOPRAZOLE 30 MG CPDR: 30 | 90 days supply | Qty: 90 | Fill #2

## 2018-10-16 NOTE — Patient Outreach (Signed)
  [facility]) Care Management Chronic Special Needs Program   10/16/2018  Name: Derek [name], DOB: 1945-05-12  MRN: [id number]  The client was discussed in today's interdisciplinary care team meeting.  The following issues were discussed:  Client's needs, Key risk triggers/risk stratification, Care Plan, Coordination of care and Issues/barriers to care  Participants present:   Mahlon Gammon, MSN, RN, CCM, CNS    Thea Silversmith, MSN, RN, CCM   Melissa Sandlin RN,BSN,CCM, CDE  Kelli Churn, RN, CCM, CDE  Marco Collie, MD  Gilda Crease, PharmD, RPh Bary Castilla, RN, BSN, MS, CCM Coralie Carpen, MD  Recommendations:  Provide education on CHO counting.  Possible Palliative care consult in the future and encourage engagement with [facility]   Plan:  Outreach to [employer] [facility] community management to promote sooner outreach to engage client  Follow-up:  Plan to follow up as per tier schedule in 3 months or sooner if needed  Peter Garter RN, Jackquline Denmark, [place] Management [phone]

## 2018-10-23 ENCOUNTER — Other Ambulatory Visit: Payer: Self-pay

## 2018-10-23 NOTE — Patient Outreach (Addendum)
  [facility]) Care Management Chronic Special Needs Program   10/23/2018  Name: HARRIS CAPELLAN, DOB: 07-04-1945  MRN: [id number]  The client was discussed in today's interdisciplinary care team meeting.  The following issues were discussed:  Client's needs, Changes in health status, Care Plan and Coordination of care  Participants present:   Thea Silversmith, MSN, RN, CCM                     Melissa Sandlin RN,BSN,CCM, CDE  Kelli Churn, RN, CCM, CDE            Marco Collie, MD        Gilda Crease, PharmD, RPh Bary Castilla, RN, BSN, MS, CCM Coralie Carpen, MD [facility] Team: Babs Bertin, RN, BSN; Teresa Pelton, RN, BSN, HSD  Actively engaged with [facility]. Omnipod approved, but client does not have omnipod as of yet; Follow up on pain management.  Plan: Continue care coordination with [facility]; assigned RNCM, Melissa Sandlin, will continue to follow as client's chronic care management coordinator.   Covering RNCM: Thea Silversmith, RN, MSN, [place] [phone]

## 2018-10-28 MED FILL — ULTCARE INS SYR 1 ML 31GX5/: 31G X 5/16" | 30 days supply | Qty: 200 | Fill #1

## 2018-11-01 MED FILL — HYDROmorphone HCL 8 MG TABS: 8 | 30 days supply | Qty: 120 | Fill #0

## 2018-11-07 DIAGNOSIS — M47816 Spondylosis without myelopathy or radiculopathy, lumbar region: Secondary | ICD-10-CM | POA: Diagnosis not present

## 2018-11-08 MED FILL — NovoLOG 100 UNIT/ML SOLN: 100 | 42 days supply | Qty: 20 | Fill #3

## 2018-11-14 DIAGNOSIS — E1065 Type 1 diabetes mellitus with hyperglycemia: Secondary | ICD-10-CM | POA: Diagnosis not present

## 2018-11-21 DIAGNOSIS — G894 Chronic pain syndrome: Secondary | ICD-10-CM | POA: Diagnosis not present

## 2018-11-26 DIAGNOSIS — R3914 Feeling of incomplete bladder emptying: Secondary | ICD-10-CM | POA: Diagnosis not present

## 2018-12-02 MED FILL — HYDROmorphone HCL 8 MG TABS: 8 | 30 days supply | Qty: 120 | Fill #0

## 2018-12-03 DIAGNOSIS — Z79891 Long term (current) use of opiate analgesic: Secondary | ICD-10-CM | POA: Diagnosis not present

## 2018-12-03 MED FILL — NARCAN 4 MG NASAL SPRAY: 4 | 2 days supply | Qty: 2 | Fill #0

## 2018-12-05 MED FILL — CIPROFLOXACIN HCL 500 MG TA: 500 | 30 days supply | Qty: 60 | Fill #2

## 2018-12-12 DIAGNOSIS — I10 Essential (primary) hypertension: Secondary | ICD-10-CM | POA: Diagnosis not present

## 2018-12-12 DIAGNOSIS — R809 Proteinuria, unspecified: Secondary | ICD-10-CM | POA: Diagnosis not present

## 2018-12-12 DIAGNOSIS — E109 Type 1 diabetes mellitus without complications: Secondary | ICD-10-CM | POA: Diagnosis not present

## 2018-12-12 DIAGNOSIS — Z23 Encounter for immunization: Secondary | ICD-10-CM | POA: Diagnosis not present

## 2018-12-12 DIAGNOSIS — Z794 Long term (current) use of insulin: Secondary | ICD-10-CM | POA: Diagnosis not present

## 2018-12-13 MED FILL — NovoLOG 100 UNIT/ML SOLN: 100 | 42 days supply | Qty: 20 | Fill #4

## 2018-12-16 DIAGNOSIS — E1065 Type 1 diabetes mellitus with hyperglycemia: Secondary | ICD-10-CM | POA: Diagnosis not present

## 2018-12-18 MED FILL — CLINDAMYCIN HCL 150 MG CAPS: 150 | 10 days supply | Qty: 30 | Fill #0

## 2018-12-20 MED FILL — HYDROmorphone HCL 8 MG TABS: 8 | 8 days supply | Qty: 40 | Fill #0

## 2018-12-25 DIAGNOSIS — F339 Major depressive disorder, recurrent, unspecified: Secondary | ICD-10-CM | POA: Diagnosis not present

## 2018-12-25 DIAGNOSIS — M189 Osteoarthritis of first carpometacarpal joint, unspecified: Secondary | ICD-10-CM | POA: Diagnosis not present

## 2018-12-25 DIAGNOSIS — E1169 Type 2 diabetes mellitus with other specified complication: Secondary | ICD-10-CM | POA: Diagnosis not present

## 2018-12-25 DIAGNOSIS — I10 Essential (primary) hypertension: Secondary | ICD-10-CM | POA: Diagnosis not present

## 2018-12-25 DIAGNOSIS — E119 Type 2 diabetes mellitus without complications: Secondary | ICD-10-CM | POA: Diagnosis not present

## 2018-12-25 DIAGNOSIS — E1165 Type 2 diabetes mellitus with hyperglycemia: Secondary | ICD-10-CM | POA: Diagnosis not present

## 2018-12-25 DIAGNOSIS — E7800 Pure hypercholesterolemia, unspecified: Secondary | ICD-10-CM | POA: Diagnosis not present

## 2018-12-27 DIAGNOSIS — Z79891 Long term (current) use of opiate analgesic: Secondary | ICD-10-CM | POA: Diagnosis not present

## 2018-12-27 DIAGNOSIS — R3914 Feeling of incomplete bladder emptying: Secondary | ICD-10-CM | POA: Diagnosis not present

## 2018-12-27 MED FILL — HYDROmorphone HCL 8 MG TABS: 8 | 30 days supply | Qty: 120 | Fill #0

## 2019-01-06 MED FILL — CLINDAMYCIN HCL 150 MG CAPS: 150 | 10 days supply | Qty: 30 | Fill #0

## 2019-01-06 MED FILL — LOSARTAN POTASSIUM 50 MG TA: 50 | 90 days supply | Qty: 90 | Fill #3

## 2019-01-10 ENCOUNTER — Other Ambulatory Visit: Payer: Self-pay

## 2019-01-10 NOTE — Patient Outreach (Signed)
  [facility]) Care Management Chronic Special Needs Program  01/10/2019  Name: Derek [name] DOB: [birth year]  MRN: [id number]  Derek [name] is enrolled in a chronic special needs plan for Diabetes. Reviewed and updated care plan.  Client is now engaged with [facility] for complex management Goals Addressed            This Visit's Progress   . COMPLETED: Client understands the importance of follow-up with providers by attending scheduled visits      . Client will report improved coping with diabetes by next 6 months(continue 01/10/19)   On track   . Client will report no fall or injuries in the next 6 months.   On track   . Client will use Assistive Devices as needed and verbalize understanding of device use   On track   . Client will verbalize knowledge of chronic lung disease as evidenced by no ED visits or Inpatient stays related to chronic lung disease    On track   . Client will verbalize knowledge of self management of Hypertension as evidences by BP reading of 140/90 or less; or as defined by provider   On track   . Client/Caregiver will verbalize understanding of instructions related to self-care and safety   On track   . Decrease inpatient admissions/ readmissions with in the next year   On track   . HEMOGLOBIN A1C < 7.0        Diabetes self management actions:  Glucose monitoring per provider recommendations  Eat Healthy  Check feet daily  Visit provider every 3-6 months as directed  Hbg A1C level every 3-6 months.  Eye Exam yearly    . Maintain timely refills of diabetic medication as prescribed within the year .   On track   . COMPLETED: Obtain annual  Lipid Profile, LDL-C       Completed 03/08/18    . COMPLETED: Obtain Annual Eye (retinal)  Exam        Completed 10/09/18    . Obtain Annual Foot Exam   On track   . COMPLETED: Obtain annual screen for micro albuminuria (urine) , nephropathy (kidney problems)       Completed  03/11/18    . COMPLETED: Obtain Hemoglobin A1C at least 2 times per year       Completed 03/08/18, 08/08/18    . COMPLETED: Visit Primary Care Provider or Endocrinologist at least 2 times per year        Visits 05/09/18, 08/08/18      Client is now actively engaged with [facility] for complex management.  Reviewed client's plan of care in [facility]'s electronic medical record- Reviewed [facility]'s plan of care for client.  Initial visit by [facility] provider on 10/17/18 and next visit 03/06/18.  [facility] is providing complex disease management for Type 1 DM, chronic pain issues, HTN, dementia and depression.  Client is declining behavior health referral.  [facility] completed COVID 19 education. Plan:  Send  outreach letter with a copy of their individualized care plan and Send individual care plan to provider  Chronic care management coordinator will outreach in:  3 Months to get update from [facility] to update care plan     Peter Garter RN, [facility], [place] Management Coordinator [place] Management [phone]

## 2019-01-14 DIAGNOSIS — E1065 Type 1 diabetes mellitus with hyperglycemia: Secondary | ICD-10-CM | POA: Diagnosis not present

## 2019-01-21 MED FILL — HYDROCODON-APAP 10-325: 10-325 | 2 days supply | Qty: 10 | Fill #0

## 2019-01-21 MED FILL — CEPHALEXIN 500 MG CAPSULE: 500 | 10 days supply | Qty: 20 | Fill #0

## 2019-01-23 MED FILL — HYDROmorphone HCL 8 MG TABS: 8 | 30 days supply | Qty: 120 | Fill #0

## 2019-01-27 MED FILL — LANSOPRAZOLE 30 MG CPDR: 30 | 90 days supply | Qty: 90 | Fill #3

## 2019-02-08 ENCOUNTER — Encounter (HOSPITAL_BASED_OUTPATIENT_CLINIC_OR_DEPARTMENT_OTHER): Payer: Self-pay | Admitting: Emergency Medicine

## 2019-02-08 ENCOUNTER — Emergency Department (HOSPITAL_BASED_OUTPATIENT_CLINIC_OR_DEPARTMENT_OTHER): Payer: HMO

## 2019-02-08 ENCOUNTER — Emergency Department (HOSPITAL_BASED_OUTPATIENT_CLINIC_OR_DEPARTMENT_OTHER)
Admission: EM | Admit: 2019-02-08 | Discharge: 2019-02-08 | Disposition: A | Discharge status: Home / Self Care | Payer: HMO | Attending: Emergency Medicine | Admitting: Emergency Medicine

## 2019-02-08 ENCOUNTER — Other Ambulatory Visit: Payer: Self-pay

## 2019-02-08 DIAGNOSIS — N200 Calculus of kidney: Secondary | ICD-10-CM | POA: Diagnosis not present

## 2019-02-08 DIAGNOSIS — E119 Type 2 diabetes mellitus without complications: Secondary | ICD-10-CM | POA: Insufficient documentation

## 2019-02-08 DIAGNOSIS — Z794 Long term (current) use of insulin: Secondary | ICD-10-CM | POA: Insufficient documentation

## 2019-02-08 DIAGNOSIS — R319 Hematuria, unspecified: Secondary | ICD-10-CM | POA: Diagnosis not present

## 2019-02-08 DIAGNOSIS — Z885 Allergy status to narcotic agent status: Secondary | ICD-10-CM | POA: Insufficient documentation

## 2019-02-08 DIAGNOSIS — R339 Retention of urine, unspecified: Secondary | ICD-10-CM | POA: Diagnosis not present

## 2019-02-08 DIAGNOSIS — Z79899 Other long term (current) drug therapy: Secondary | ICD-10-CM | POA: Diagnosis not present

## 2019-02-08 DIAGNOSIS — I10 Essential (primary) hypertension: Secondary | ICD-10-CM | POA: Diagnosis not present

## 2019-02-08 DIAGNOSIS — Z87891 Personal history of nicotine dependence: Secondary | ICD-10-CM | POA: Insufficient documentation

## 2019-02-08 DIAGNOSIS — M545 Low back pain: Secondary | ICD-10-CM | POA: Diagnosis not present

## 2019-02-08 LAB — CBC WITH DIFFERENTIAL/PLATELET
Abs Immature Granulocytes: 0.01 10*3/uL (ref 0.00–0.07)
Basophils Absolute: 0.1 10*3/uL (ref 0.0–0.1)
Basophils Relative: 1 %
Eosinophils Absolute: 0.7 10*3/uL — ABNORMAL HIGH (ref 0.0–0.5)
Eosinophils Relative: 7 %
HCT: 39.1 % (ref 39.0–52.0)
Hemoglobin: 12.8 g/dL — ABNORMAL LOW (ref 13.0–17.0)
Immature Granulocytes: 0 %
Lymphocytes Relative: 40 %
Lymphs Abs: 4 10*3/uL (ref 0.7–4.0)
MCH: 29 pg (ref 26.0–34.0)
MCHC: 32.7 g/dL (ref 30.0–36.0)
MCV: 88.5 fL (ref 80.0–100.0)
Monocytes Absolute: 1.2 10*3/uL — ABNORMAL HIGH (ref 0.1–1.0)
Monocytes Relative: 12 %
Neutro Abs: 3.9 10*3/uL (ref 1.7–7.7)
Neutrophils Relative %: 40 %
Platelets: 442 10*3/uL — ABNORMAL HIGH (ref 150–400)
RBC: 4.42 MIL/uL (ref 4.22–5.81)
RDW: 13.2 % (ref 11.5–15.5)
WBC: 9.8 10*3/uL (ref 4.0–10.5)
nRBC: 0 % (ref 0.0–0.2)

## 2019-02-08 LAB — URINALYSIS, ROUTINE W REFLEX MICROSCOPIC
Leukocytes,Ua: NEGATIVE
Nitrite: NEGATIVE

## 2019-02-08 LAB — COMPREHENSIVE METABOLIC PANEL
ALT: 19 U/L (ref 0–44)
AST: 16 U/L (ref 15–41)
Albumin: 3.8 g/dL (ref 3.5–5.0)
Alkaline Phosphatase: 83 U/L (ref 38–126)
Anion gap: 5 (ref 5–15)
BUN: 19 mg/dL (ref 8–23)
CO2: 25 mmol/L (ref 22–32)
Calcium: 10.3 mg/dL (ref 8.9–10.3)
Chloride: 106 mmol/L (ref 98–111)
Creatinine, Ser: 0.85 mg/dL (ref 0.61–1.24)
GFR calc Af Amer: >60 mL/min (ref >60)
GFR calc non Af Amer: >60 mL/min (ref >60)
Glucose, Bld: 144 mg/dL — ABNORMAL HIGH (ref 70–99)
Potassium: 4.2 mmol/L (ref 3.5–5.1)
Sodium: 136 mmol/L (ref 135–145)
Total Bilirubin: 0.8 mg/dL (ref 0.3–1.2)
Total Protein: 6.8 g/dL (ref 6.5–8.1)

## 2019-02-08 LAB — URINALYSIS, MICROSCOPIC (REFLEX): RBC / HPF: >50 RBC/hpf (ref 0–5)

## 2019-02-08 MED ORDER — CEPHALEXIN 500 MG PO CAPS: 500.0000 mg | ORAL_CAPSULE | Freq: Two times a day (BID) | ORAL | 0 refills | Status: AC

## 2019-02-08 MED ORDER — FLUCONAZOLE 150 MG PO TABS
150.0000 mg | ORAL_TABLET | Freq: Once | ORAL | Status: AC
  Administered 2019-02-08: 150 mg via ORAL
  Filled 2019-02-08: qty 1

## 2019-02-08 NOTE — ED Notes (Signed)
ED Provider at bedside. 

## 2019-02-08 NOTE — ED Provider Notes (Signed)
[facility] EMERGENCY DEPARTMENT Provider Note   CSN: [id number] Arrival date & time: 02/08/19  1216     History Chief Complaint  Patient presents with  . Hematuria    Derek [name] is a 73 y.o. male.  HPI   Pt is a 73 y/o male with a h/o BPH, DDD, factor V leiden mutation, GERD, nephrolithiasis, pancreatic CA, DM, who presents to the ED today for eval of hematuria. States he self caths and this AM noted bright red blood when urinating. Since then he has had several more episodes and actually passed some clots. Denies dysuria but states he would not be able to tell due to self cathing. Denies urgency or frequency. He denies fevers. He does c/o right lower back/right flank pain. States pain is minimal and seems to be improving. Pain has been constant since onset. States if feels different than a kidney stone. Denies NVD, constipation. Follows with Dr. Eulogio Ditch with urology.   Past Medical History:  Diagnosis Date  . Anxiety   . Arthritis   . Bilateral leg cramps    takes magnesium  . BPH (benign prostatic hyperplasia)   . DDD (degenerative disc disease), lumbar    gets back injections   . Deafness in right ear    meniere's disease  . Depression   . Dermatitis    bilateral hands  . Factor V Leiden mutation ([place])    "never had blood clots"  . GERD (gastroesophageal reflux disease)   . Hiatal hernia   . History of kidney stones    multiple kidney stones-not a problem at present  . 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 History of panic attacks   . 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 Hypertension   . Incomplete right bundle branch block   . Insulin dependent type 2 diabetes mellitus, uncontrolled ([place]) followed by dr [name] ([place])   dx 2007--- ltype 1 now  since jan 2017 surgery with part of pancreas removed  . Lower urinary tract symptoms (LUTS)   . Meniere's disease dx 1970s   intermittant vertigo  . Obstructive sleep apnea    " i used to have sleep apnea" never used a c-pap   . Open wound of abdomen    WET/DRY DRESSING CHANGES TID--- POST ABD. SURGERY 09-21-2016 healed now  . Peripheral vascular disease (HCC)    right leg - decreased pulse   . Wears partial dentures    LOWER  . 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 Foreign body of abdominal wall 09/04/2016  . Ventral incisional hernia 11/12/2015  . Incisional hernia 11/10/2015  . 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 Pleural effusion on left   . Lactose intolerance in adult 04/04/2015  . Protein-calorie malnutrition, moderate ([place]) 04/04/2015  . Pleural effusion, left   . Debility   . Benign essential HTN   . Leukocytosis   . Thrombocytosis ([place])   . Intra-abdominal abscess ([place])   . Poorly controlled type 2 diabetes mellitus ([place])   . 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 OA (osteoarthritis) of knee 06/02/2013  . Anal  fissure 02/04/2013  . Meniere's disease   . GERD (gastroesophageal reflux disease)   . Kidney stone   . Obstructive sleep apnea   . Morbid obesity ([place])   . Synovitis of knee 03/01/2012  . Chronic cough 11/03/2011  . COSTOCHONDRITIS, LEFT 10/08/2009  . RIB PAIN, LEFT SIDED 10/08/2009    Past Surgical History:  Procedure Laterality Date  . ANAL FISTULECTOMY N/A 04/01/2013   Procedure: EXAM UNDER ANESTHESIA WITH ANAL FISSURectomy, sphincterotomy and internal hemorrhoidectomy;  Surgeon: Harl Bowie, MD;  Location: [place];  Service: General;  Laterality: N/A;  . APPLICATION OF WOUND VAC N/A 05/16/2015   Procedure: APPLICATION OF WOUND VAC;  Surgeon: Armandina Gemma, MD;  Location: [facility];  Service: General;  Laterality: N/A;  . APPLICATION OF WOUND VAC N/A 05/20/2015   Procedure: EXHANGE OF ABDOMINAL  WOUND VAC DRESSING;  Surgeon: Armandina Gemma, MD;  Location: [facility];  Service: General;  Laterality: N/A;  . COLONOSCOPY    . CYSTOSCOPY WITH INSERTION OF UROLIFT N/A 10/12/2016   Procedure: CYSTOSCOPY WITH INSERTION OF UROLIFT;  Surgeon: Franchot Gallo, MD;  Location[facility];  Service: Urology;  Laterality: N/A;  . EUS N/A 12/31/2014   Procedure: UPPER ENDOSCOPIC ULTRASOUND (EUS) LINEAR;  Surgeon: Milus Banister, MD;  [facility];  Service: Endoscopy;  Laterality: N/A;  . HARDWARE REMOVAL Left 2013   left ankle  . HEMORRHOIDECTOMY WITH HEMORRHOID BANDING    . INCISION AND DRAINAGE ABSCESS N/A 05/14/2015   Procedure: DRAINAGE OF INFECTED PANCREATIC PSEUDOCYST;  Surgeon: Armandina Gemma, MD;  [facility];  Service: General;  Laterality: N/A;  . INCISION AND DRAINAGE OF WOUND N/A 05/16/2015   Procedure: IRRIGATION AND DEBRIDEMENT WOUND;  Surgeon: Armandina Gemma, MD;  [facility];  Service: General;  Laterality: N/A;  . INCISIONAL HERNIA REPAIR N/A 11/12/2015   Procedure: [place];  Surgeon: Armandina Gemma, MD;  Location: [facility];  Service: General;  Laterality: N/A;  . INGUINAL HERNIA REPAIR Right 1974;  1982  . INSERTION OF MESH N/A 11/12/2015   Procedure: INSERTION OF MESH;  Surgeon: Armandina Gemma, MD;  Location: [place];  Service: General;  Laterality: N/A;  . IR GENERIC HISTORICAL  04/20/2015   IR RADIOLOGIST EVAL & MGMT 04/20/2015 [facility] INTERV RAD  . KNEE ARTHROSCOPY Bilateral right 08-08-2000;  left 11-23-2000   meniscal repair and chondroplasty's  . KNEE ARTHROSCOPY  03/01/2012   Procedure: ARTHROSCOPY KNEE;  Surgeon: Gearlean Alf, MD;  Location[facility];  Service: Orthopedics;  Laterality: Left;  WITH SYNOVECTOMY   . LAPAROTOMY N/A 05/14/2015   Procedure: EXPLORATORY  LAPAROTOMY;  Surgeon: Armandina Gemma, MD;  [facility];  Service: General;  Laterality: N/A;  . ORIF LEFT ANKLE FX  08/20/2009   distal tib-fib and malleolus  . ROTATOR CUFF REPAIR Bilateral right 1994; left 1993, left 1993  . TOTAL KNEE ARTHROPLASTY Right 06/02/2013   Procedure: RIGHT TOTAL KNEE ARTHROPLASTY;  Surgeon: Gearlean Alf, MD;  [facility];  Service: Orthopedics;  Laterality: Right;  . TOTAL KNEE ARTHROPLASTY Left 01-31-2008   dr Wynelle Link  . TOTAL KNEE REVISION Right 01/23/2018   Procedure: right knee polyethylene revision;  Surgeon: Gaynelle Arabian, MD;  Location[facility];  Service: Orthopedics;  Laterality: Right;  65min  . UMBILICAL HERNIA REPAIR  08-11-1999   dr Ninfa Linden  . UPPER GASTROINTESTINAL ENDOSCOPY  sept 2016  . WOUND EXPLORATION N/A 09/21/2016   Procedure: WOUND  EXPLORATION AND DEBRIDEMENT ABDOMINAL WALL;  Surgeon: Armandina Gemma, MD;  [facility];  Service: General;  Laterality: N/A;       Family History  Problem Relation Age of Onset  . Bone cancer Father        Jaw  . Factor V Leiden deficiency Daughter   . Diabetes Maternal Grandmother   . Heart disease Paternal Uncle   . Colon cancer Neg Hx   . Esophageal cancer Neg Hx   . Stomach cancer Neg Hx   . Rectal cancer Neg Hx     Social History   Tobacco Use  . Smoking status: Former Smoker    Packs/day: 4.00    Years: 17.00    Pack years: 68.00    Types: Cigarettes    Quit date: 02/21/1975    Years since quitting: 43.9  . Smokeless tobacco: Never Used  Substance Use Topics  . Alcohol use: No    Alcohol/week: 0.0 standard drinks  . Drug use: No    Home Medications Prior to Admission medications   Medication Sig Start Date End Date Taking? Authorizing Provider  Ascorbic Acid (VITAMIN C PO) Take by mouth.   Yes [provider]  carvedilol (COREG) 25 MG tablet Take 0.5 tablets (12.5 mg total) by mouth 2 (two) times daily with a meal. Patient taking differently: Take 25 mg by mouth  every evening.  04/06/15  Yes Michael Boston, MD  HYDROmorphone (DILAUDID) 4 MG tablet Take 8 mg by mouth 4 (four) times daily as needed for moderate pain.    Yes [provider]  losartan (COZAAR) 50 MG tablet Take 50 mg by mouth at bedtime.    Yes [provider]  magnesium oxide (MAG-OX) 400 MG tablet Take 800 mg by mouth at bedtime.   Yes [provider]  ciprofloxacin (CIPRO) 500 MG tablet Take 500 mg by mouth daily as needed (for UTI).    [provider]  clobetasol ointment (TEMOVATE) [id number] % Apply 1 application topically daily as needed (for peeling skin on hands and feet). Reported on 03/17/2015    [provider]  DULoxetine (CYMBALTA) 30 MG capsule Take 30 mg by mouth at bedtime.  07/23/17   [provider]  insulin aspart (NOVOLOG) 100 UNIT/ML injection Inject 0-9 Units into the skin 3 (three) times daily before meals. Patient taking differently: Inject 5-30 Units into the skin 3 (three) times daily before meals. Per sliding scale 03/03/15   Armandina Gemma, MD  insulin NPH Human (HUMULIN N,NOVOLIN N) 100 UNIT/ML injection Inject 25-50 Units into the skin See admin instructions. Inject 50 units SQ in the morning, inject 25 units SQ at lunch and inject 40 units SQ at bedtime    [provider]  lansoprazole (PREVACID) 30 MG capsule Take 30 mg by mouth daily as needed (for acid reflux).     [provider]  methocarbamol (ROBAXIN) 500 MG tablet Take 1 tablet (500 mg total) by mouth every 6 (six) hours as needed for muscle spasms. 01/24/18   Edmisten, Kristie L, PA  nitroGLYCERIN (NITROSTAT) 0.4 MG SL tablet Place 0.4 mg under the tongue every 5 (five) minutes as needed for chest pain.    [provider]  rivaroxaban (XARELTO) 10 MG TABS tablet Take 1 tablet (10 mg total) by mouth daily with breakfast for 20 days. Then resume one 81 mg aspirin once a day. 01/24/18 02/13/18  Edmisten, Ok Anis, PA    Allergies    Oxycontin  [  oxycodone hcl], Quinine, Voltaren [diclofenac sodium], Doxycycline, and Morphine and related  Review of Systems   Review of Systems  Constitutional: Negative for chills and fever.  HENT: Negative for ear pain and sore throat.   Eyes: Negative for pain and visual disturbance.  Respiratory: Negative for cough and shortness of breath.   Cardiovascular: Negative for chest pain.  Gastrointestinal: Negative for abdominal pain, constipation, diarrhea, nausea and vomiting.  Genitourinary: Positive for hematuria. Negative for dysuria and urgency.  Musculoskeletal: Positive for back pain.  Skin: Negative for color change and rash.  Neurological: Negative for seizures and syncope.  All other systems reviewed and are negative.   Physical Exam Updated Vital Signs BP (!) 135/55   Pulse 62   Temp 98.7 F (37.1 C) (Oral)   Resp 16   Ht 6' (1.829 m)   Wt 120.2 kg   SpO2 100%   BMI 35.94 kg/m   Physical Exam Vitals and nursing note reviewed.  Constitutional:      Appearance: He is well-developed.  HENT:     Head: Normocephalic and atraumatic.  Eyes:     Conjunctiva/sclera: Conjunctivae normal.  Cardiovascular:     Rate and Rhythm: Normal rate and regular rhythm.     Heart sounds: Normal heart sounds. No murmur.  Pulmonary:     Effort: Pulmonary effort is normal. No respiratory distress.     Breath sounds: Normal breath sounds.  Abdominal:     General: Bowel sounds are normal.     Palpations: Abdomen is soft.     Tenderness: There is no abdominal tenderness. There is no right CVA tenderness, left CVA tenderness, guarding or rebound.  Musculoskeletal:     Cervical back: Neck supple.     Comments: No reproducible ttp to the right lower back  Skin:    General: Skin is warm and dry.  Neurological:     Mental Status: He is alert.     ED Results / Procedures / Treatments   Labs (all labs ordered are listed, but only abnormal results are displayed) Labs Reviewed  URINALYSIS,  ROUTINE W REFLEX MICROSCOPIC - Abnormal; Notable for the following components:      Result Value   Color, Urine RED (*)    APPearance TURBID (*)    Glucose, UA   (*)    Value: TEST NOT REPORTED DUE TO COLOR INTERFERENCE OF URINE PIGMENT   Hgb urine dipstick   (*)    Value: TEST NOT REPORTED DUE TO COLOR INTERFERENCE OF URINE PIGMENT   Bilirubin Urine   (*)    Value: TEST NOT REPORTED DUE TO COLOR INTERFERENCE OF URINE PIGMENT   Ketones, ur   (*)    Value: TEST NOT REPORTED DUE TO COLOR INTERFERENCE OF URINE PIGMENT   Protein, ur   (*)    Value: TEST NOT REPORTED DUE TO COLOR INTERFERENCE OF URINE PIGMENT   All other components within normal limits  CBC WITH DIFFERENTIAL/PLATELET - Abnormal; Notable for the following components:   Hemoglobin 12.8 (*)    Platelets 442 (*)    Monocytes Absolute 1.2 (*)    Eosinophils Absolute 0.7 (*)    All other components within normal limits  COMPREHENSIVE METABOLIC PANEL - Abnormal; Notable for the following components:   Glucose, Bld 144 (*)    All other components within normal limits  URINALYSIS, MICROSCOPIC (REFLEX) - Abnormal; Notable for the following components:   Bacteria, UA MANY (*)    All other components within normal  limits  URINE CULTURE    EKG None  Radiology CT Renal Stone Study  Result Date: 02/08/2019 CLINICAL DATA:  Hematuria.  History of pancreatic cancer. EXAM: CT ABDOMEN AND PELVIS WITHOUT CONTRAST TECHNIQUE: Multidetector CT imaging of the abdomen and pelvis was performed following the standard protocol without IV contrast. COMPARISON:  CT abdomen pelvis dated September 05, 2018. FINDINGS: Lower chest: No acute abnormality. Hepatobiliary: No focal liver abnormality is seen. No gallstones, gallbladder wall thickening, or biliary dilatation. Pancreas: Prior resection of the pancreatic body and tail. No ductal dilatation or surrounding inflammatory changes. Spleen: Surgically absent. Adrenals/Urinary Tract: The adrenal glands are  unremarkable. New punctate calculus in the lower pole of the left kidney. No hydronephrosis. Unchanged thickened bladder wall with right posterolateral diverticulum. New 3.3 cm hyperdensity within the diverticulum. Stomach/Bowel: Unchanged moderate hiatal hernia. No bowel wall thickening, distention, or surrounding inflammatory changes. Normal appendix. Vascular/Lymphatic: Aortic atherosclerosis. Reactive bilateral inguinal lymph nodes are unchanged. Reproductive: Prior Urolift procedure.  Prostate is normal sized. Other: Unchanged small fat containing left inguinal hernia. No free fluid or pneumoperitoneum. Musculoskeletal: No acute or significant osseous findings. IMPRESSION: 1. Unchanged bladder wall thickening and right posterolateral diverticulum with new 3.3 cm blood clot within the diverticulum. 2. New punctate nonobstructive left nephrolithiasis. 3.  Aortic atherosclerosis (ICD10-I70.0). Electronically Signed   By: Titus Dubin M.D.   On: 02/08/2019 14:40    Procedures Procedures (including critical care time)  Medications Ordered in ED Medications  fluconazole (DIFLUCAN) tablet 150 mg (150 mg Oral Given 02/08/19 1454)    ED Course  I have reviewed the triage vital signs and the nursing notes.  Pertinent labs & imaging results that were available during my care of the patient were reviewed by me and considered in my medical decision making (see chart for details).    MDM Rules/Calculators/A&P                      73 year old male presenting for evaluation of painless hematuria that started this morning.  Has been passing bright red blood and also passed clots while at his PCP office today.  Has had no fevers.  Has had some right lower back pain however it has been improving and does not feel consistent with prior kidney stones.  CBC without leukocytosis, mild anemia, mild thrombocytosis. CMP within normal limits UA was grossly abnormal secondary to hematuria and therefore difficult  to interpret.  Microscopic urinalysis with greater than 50 RBCs, 0-5 WBCs, many bacteria and 6-10 squamous epithelial cells.  Also with budding yeast.  Unclear if patient having hematuria secondary to UTI, will treat with Keflex.  Will culture urine.  CT renal scan with unchanged bladder wall thickening and right posterior lateral diverticulum with new 3.3 similar blood clot within the diverticulum.  New, punctate nonobstructive left nephrolithiasis and aortic atherosclerosis.  Communicated results with the patient and plan to treat him for possible UTI and recommended outpatient follow-up with urology for further work-up regarding his hematuria.  Advised on return precautions.  He voices understanding of plan and reasons to return.  All questions answered.  Patient stable for discharge.  Pt seen in conjunction with Dr. Ralene Bathe, supervising physician, who personally evaluated the patient and is in agreement with plan.  Final Clinical Impression(s) / ED Diagnoses Final diagnoses:  Hematuria, unspecified type    Rx / DC Orders ED Discharge Orders    None       Rodney Booze, PA-C 02/08/19 1524  Quintella Reichert, MD 02/09/19 [phone]

## 2019-02-08 NOTE — ED Notes (Signed)
Patient transported to CT 

## 2019-02-08 NOTE — Discharge Instructions (Addendum)
You were given a prescription for antibiotics. Please take the antibiotic prescription fully.   Please call your neurologist on Monday, 02/10/2019 to schedule an appointment for follow-up next week.  Return to the emergency department for any new or worsening symptoms in the meantime.

## 2019-02-08 NOTE — ED Triage Notes (Signed)
He self caths for urine. Noticed blood clots in his urine today. Endorses back pain.

## 2019-02-09 LAB — URINE CULTURE: Culture: NO GROWTH

## 2019-02-11 MED FILL — CEPHALEXIN 500 MG CAPSULE: 500 | 7 days supply | Qty: 14 | Fill #0

## 2019-02-12 DIAGNOSIS — E1065 Type 1 diabetes mellitus with hyperglycemia: Secondary | ICD-10-CM | POA: Diagnosis not present

## 2019-02-12 MED FILL — NovoLOG 100 UNIT/ML SOLN: 100 | 42 days supply | Qty: 20 | Fill #5

## 2019-02-13 DIAGNOSIS — R3914 Feeling of incomplete bladder emptying: Secondary | ICD-10-CM | POA: Diagnosis not present

## 2019-02-20 DIAGNOSIS — M189 Osteoarthritis of first carpometacarpal joint, unspecified: Secondary | ICD-10-CM | POA: Diagnosis not present

## 2019-02-20 DIAGNOSIS — E7800 Pure hypercholesterolemia, unspecified: Secondary | ICD-10-CM | POA: Diagnosis not present

## 2019-02-20 DIAGNOSIS — E1165 Type 2 diabetes mellitus with hyperglycemia: Secondary | ICD-10-CM | POA: Diagnosis not present

## 2019-02-20 DIAGNOSIS — F339 Major depressive disorder, recurrent, unspecified: Secondary | ICD-10-CM | POA: Diagnosis not present

## 2019-02-20 DIAGNOSIS — E1169 Type 2 diabetes mellitus with other specified complication: Secondary | ICD-10-CM | POA: Diagnosis not present

## 2019-02-20 DIAGNOSIS — E119 Type 2 diabetes mellitus without complications: Secondary | ICD-10-CM | POA: Diagnosis not present

## 2019-02-20 DIAGNOSIS — I10 Essential (primary) hypertension: Secondary | ICD-10-CM | POA: Diagnosis not present

## 2019-02-20 MED FILL — PRAVASTATIN NA 20 MG TAB: 20 | 90 days supply | Qty: 26 | Fill #0

## 2019-02-24 MED FILL — ULTCARE INS SYR 1 ML 31GX5/: 31G X 5/16" | 30 days supply | Qty: 200 | Fill #2

## 2019-02-25 MED FILL — HYDROmorphone HCL 8 MG TABS: 8 | 30 days supply | Qty: 120 | Fill #0

## 2019-03-05 ENCOUNTER — Other Ambulatory Visit: Payer: Self-pay

## 2019-03-05 NOTE — Patient Outreach (Signed)
  [facility]) Care Management Chronic Special Needs Program   03/05/2019  Name: Derek [name], DOB: 04/24/1945  MRN: [id number]  The client was discussed in today's interdisciplinary care team meeting.  The following issues were discussed:  Client's needs, Key risk triggers/risk stratification, Care Plan, Coordination of care and Issues/barriers to care  Participants present:   Thea Silversmith, MSN, RN, CCM   Darcy Barbara RN,BSN,CCM, CDE  Quinn Plowman RN, BSN, CCM Kelli Churn, RN, CCM, CDE  Maryella Shivers, MD  Bary Castilla, RN, BSN, MS, CCM Coralie Carpen, MD [facility] CBCS/CMAA  Recommendations: [facility] to continue to provide complex management.  Continue to provide diabetes self management education  Plan:  RNCM will follow up with client to update Interdisciplinary care plan   Follow-up:  Every 3 months per tier level as scheduled  Peter Garter RN, Jackquline [place], [place] [facility] [phone]

## 2019-03-13 DIAGNOSIS — M5416 Radiculopathy, lumbar region: Secondary | ICD-10-CM | POA: Diagnosis not present

## 2019-03-13 DIAGNOSIS — I10 Essential (primary) hypertension: Secondary | ICD-10-CM | POA: Diagnosis not present

## 2019-03-14 DIAGNOSIS — E1065 Type 1 diabetes mellitus with hyperglycemia: Secondary | ICD-10-CM | POA: Diagnosis not present

## 2019-03-18 ENCOUNTER — Ambulatory Visit: Payer: HMO

## 2019-03-19 DIAGNOSIS — N3000 Acute cystitis without hematuria: Secondary | ICD-10-CM | POA: Diagnosis not present

## 2019-03-19 DIAGNOSIS — R3914 Feeling of incomplete bladder emptying: Secondary | ICD-10-CM | POA: Diagnosis not present

## 2019-03-19 DIAGNOSIS — N401 Enlarged prostate with lower urinary tract symptoms: Secondary | ICD-10-CM | POA: Diagnosis not present

## 2019-03-21 DIAGNOSIS — M5416 Radiculopathy, lumbar region: Secondary | ICD-10-CM | POA: Diagnosis not present

## 2019-03-21 DIAGNOSIS — R269 Unspecified abnormalities of gait and mobility: Secondary | ICD-10-CM | POA: Diagnosis not present

## 2019-03-24 MED FILL — NovoLOG 100 UNIT/ML SOLN: 100 | 42 days supply | Qty: 20 | Fill #0

## 2019-03-24 MED FILL — NITROFURANTOIN MONO-MCR 100: 100 | 38 days supply | Qty: 45 | Fill #0

## 2019-03-26 ENCOUNTER — Other Ambulatory Visit: Payer: Self-pay | Admitting: Urology

## 2019-03-27 ENCOUNTER — Ambulatory Visit: Payer: HMO | Attending: Internal Medicine

## 2019-03-27 DIAGNOSIS — Z23 Encounter for immunization: Secondary | ICD-10-CM

## 2019-03-27 NOTE — Progress Notes (Signed)
   Covid-19 Vaccination Clinic  Name:  Derek [name]    MRN: [id number] DOB: Mar 13, 1945  03/27/2019  [name] was observed post Covid-19 immunization for 15 minutes without incidence. He was provided with Vaccine Information Sheet and instruction to access the V-Safe system.   [name] was instructed to call 911 with any severe reactions post vaccine: Marland Kitchen Difficulty breathing  . Swelling of your face and throat  . A fast heartbeat  . A bad rash all over your body  . Dizziness and weakness    Immunizations Administered    Name Date Dose VIS Date Route   Pfizer COVID-19 Vaccine 03/27/2019  6:01 PM 0.3 mL 01/31/2019 Intramuscular   Manufacturer: [place]   Lot: [id number]   [place]: [id number]

## 2019-04-01 MED FILL — HYDROmorphone HCL 8 MG TABS: 8 | 30 days supply | Qty: 120 | Fill #0

## 2019-04-01 MED FILL — CARVEDILOL 25 MG TABLET: 25 | 90 days supply | Qty: 90 | Fill #0

## 2019-04-08 ENCOUNTER — Ambulatory Visit: Payer: HMO

## 2019-04-10 DIAGNOSIS — M545 Low back pain: Secondary | ICD-10-CM | POA: Diagnosis not present

## 2019-04-10 DIAGNOSIS — G894 Chronic pain syndrome: Secondary | ICD-10-CM | POA: Diagnosis not present

## 2019-04-10 DIAGNOSIS — M5136 Other intervertebral disc degeneration, lumbar region: Secondary | ICD-10-CM | POA: Diagnosis not present

## 2019-04-11 DIAGNOSIS — M189 Osteoarthritis of first carpometacarpal joint, unspecified: Secondary | ICD-10-CM | POA: Diagnosis not present

## 2019-04-11 DIAGNOSIS — E7800 Pure hypercholesterolemia, unspecified: Secondary | ICD-10-CM | POA: Diagnosis not present

## 2019-04-11 DIAGNOSIS — F339 Major depressive disorder, recurrent, unspecified: Secondary | ICD-10-CM | POA: Diagnosis not present

## 2019-04-11 DIAGNOSIS — I10 Essential (primary) hypertension: Secondary | ICD-10-CM | POA: Diagnosis not present

## 2019-04-11 DIAGNOSIS — E1165 Type 2 diabetes mellitus with hyperglycemia: Secondary | ICD-10-CM | POA: Diagnosis not present

## 2019-04-11 DIAGNOSIS — E119 Type 2 diabetes mellitus without complications: Secondary | ICD-10-CM | POA: Diagnosis not present

## 2019-04-11 DIAGNOSIS — E1169 Type 2 diabetes mellitus with other specified complication: Secondary | ICD-10-CM | POA: Diagnosis not present

## 2019-04-14 ENCOUNTER — Encounter (HOSPITAL_BASED_OUTPATIENT_CLINIC_OR_DEPARTMENT_OTHER): Payer: Self-pay | Admitting: Urology

## 2019-04-14 DIAGNOSIS — N401 Enlarged prostate with lower urinary tract symptoms: Secondary | ICD-10-CM | POA: Diagnosis not present

## 2019-04-15 ENCOUNTER — Other Ambulatory Visit: Payer: Self-pay

## 2019-04-15 ENCOUNTER — Encounter (HOSPITAL_BASED_OUTPATIENT_CLINIC_OR_DEPARTMENT_OTHER): Payer: Self-pay | Admitting: Urology

## 2019-04-15 DIAGNOSIS — I10 Essential (primary) hypertension: Secondary | ICD-10-CM | POA: Diagnosis not present

## 2019-04-15 DIAGNOSIS — E109 Type 1 diabetes mellitus without complications: Secondary | ICD-10-CM | POA: Diagnosis not present

## 2019-04-15 DIAGNOSIS — R809 Proteinuria, unspecified: Secondary | ICD-10-CM | POA: Diagnosis not present

## 2019-04-15 DIAGNOSIS — Z794 Long term (current) use of insulin: Secondary | ICD-10-CM | POA: Diagnosis not present

## 2019-04-15 NOTE — Progress Notes (Signed)
Spoke w/ via phone for pre-op interview---Pal and Cinthia Rodden spouse Lab needs dos----   I stat 8, ekg            COVID test ------04-17-2019 Arrive at -------730 am  04-21-2019 NPO after ------midnight Medications to take morning of surgery -----dilaudid prn Diabetic medication -----none day of surgery, take 1/2 dose of nph insulin hs 04-20-2019 Patient Special Instructions -----patient given overnight stay instructions including bring all prescription medication in original containers Pre-Op special Istructions ----- Patient verbalized understanding of instructions that were given at this phone interview. Patient denies shortness of breath, chest pain, fever, cough a this phone interview.

## 2019-04-16 MED FILL — CEPHALEXIN 500 MG CAPSULE: 500 | 7 days supply | Qty: 14 | Fill #0

## 2019-04-17 ENCOUNTER — Other Ambulatory Visit (HOSPITAL_COMMUNITY)
Admission: RE | Admit: 2019-04-17 | Discharge: 2019-04-17 | Disposition: A | Discharge status: Home / Self Care | Payer: HMO | Source: Ambulatory Visit | Attending: Urology | Admitting: Urology

## 2019-04-17 DIAGNOSIS — Z01812 Encounter for preprocedural laboratory examination: Secondary | ICD-10-CM | POA: Diagnosis not present

## 2019-04-17 DIAGNOSIS — Z20822 Contact with and (suspected) exposure to covid-19: Secondary | ICD-10-CM | POA: Insufficient documentation

## 2019-04-17 LAB — SARS CORONAVIRUS 2 (TAT 6-24 HRS): SARS Coronavirus 2: NEGATIVE

## 2019-04-18 ENCOUNTER — Other Ambulatory Visit: Payer: Self-pay

## 2019-04-18 NOTE — Patient Outreach (Signed)
[facility]) Care Management Chronic Special Needs Program  04/18/2019  Name: Derek [name] DOB: 02-28-1945  MRN: [id number]  Derek [name] is enrolled in a chronic special needs plan for Diabetes. Chronic Care Management Coordinator telephoned Derek [name] to review health risk assessment and to develop individualized care plan.  Reviewed the chronic care management program, importance of Derek [name] participation, and taking their care plan to all provider appointments and inpatient facilities.  Reviewed the transition of care process and possible referral to community care management. Derek [name] requests RNCM speak with wife Kevontae Silveria HIPAA verified Subjective:States that he has been having frequent UTI since December and he is to have prostate surgery on Monday.  States he is on his 4th antibiotic.  States that his blood sugars have been running higher.  States he saw Dr.Balan on Tuesday and she adjusted his insulin.  States his blood sugars have improved since then.  States he is wearing his [employer] and his blood sugars range from 144-261.  States his pain stimulator insertion has been placed on hold since he has been having UTI.  States his back pain has been a little better and he is taking his Dilaudid as ordered.  Wife states she helps Derek [name] with his medications.  Wife states Derek [name] is watching his diet better and counting the CHO he eats.  States they decided to not go on the insulin pump at this time.  Denies any recent falls.  Wife states that clients memory is about the same.  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  States that the [facility] provider visited on 04/14/19  Goals Addressed            This Visit's Progress   . Derek [name] will report improved coping  with chronic back pain by next 6 months       Plan to continue to be followed by pain control/orthopedic doctor and call as needed Sent EMMI Chronic pain    . Derek [name] will report improved coping  with diabetes by next 6 months(continue 04/18/19)   On track    Reinforced to call endocrinologist for elevated blood sugars  Encouraged to increase activity as tolerated     . Derek [name] will report no fall or injuries in the next 6 months.(continue 04/18/19   On track    No reports of falls  Educated on fall precautions and home safety Stand up slowly Use cane or walker at all times Use grabber to pick up objects on the floor Keep home well-lit and wear your glasses     . COMPLETED: Derek [name] will use Assistive Devices as needed and verbalize understanding of device use   On track    Reports no issues with continuous glucose monitor Goal completed 04/18/19    . Derek [name] will verbalize knowledge of chronic lung disease as evidenced by no ED visits or Inpatient stays related to chronic lung disease    On track    Reports no issues with lungs/chronic bronchitis Sent EMMI Chronic Bronchitis     . Derek [name] will verbalize knowledge of self management of Hypertension as evidences by BP reading of 140/90 or less; or as defined by provider   On track    Reports B/P less than 140/90 when checked Plan to check B/P regularly Take B/P medications as ordered Plan to follow a low salt diet  Increase activity as tolerated    . Derek [name]/Caregiver will verbalize understanding of instructions related to self-care and safety   On  track    Reinforced to caregiver that resources are available for caregiver stress if needed    . COMPLETED: Decrease inpatient admissions/ readmissions with in the next year   On track    No admissions 2020 Goal completed 04/18/19    . HEMOGLOBIN A1C < 7.0        Hemoglobin A1C 9.8% on 12/12/18 Checked on 04/15/19 but does not have results yet Reviewed fasting blood sugar goals of 80-130 and less than 180 1 1/2-2 hours after meals Reinforced carbohydrate counting and carbohydrate portion sizes Instructed to review handouts diabetes checklist and diabetes-controlling blood sugar    .  Maintain timely refills of diabetic medication as prescribed within the year .   On track    Maintaining timely refills of medications per dispense report Reinforced importance of getting medications refilled on time    . Obtain annual  Lipid Profile, LDL-C   On track    Last completed 03/08/18 LDL 72 The goal for LDL is less than 70 mg/dL as you are at high risk for complications Try to avoid saturated fats, trans-fats and eat more fiber    . Obtain Annual Eye (retinal)  Exam    On track    Last completed 10/09/18 Plan to have a dilated eye exam every year    . Obtain Annual Foot Exam   On track    Check your skin and feet every day for cuts, bruises, redness, blisters, or sores. Schedule a foot exam with your health care provider once every year    . Obtain annual screen for micro albuminuria (urine) , nephropathy (kidney problems)   On track    Last completed 03/11/18 It is important for your doctor to check your urine for protein at least every year    . Obtain Hemoglobin A1C at least 2 times per year   On track    Last completed 12/12/18, 04/15/19 It is important to have your Hemoglobin A1C checked every 6 months if you are at goal and every 3 months if you are not at goal    . Patient Stated to have fewer UTI (pt-stated)       Derek [name] scheduled for TURP on 04/21/19 Reinforced to take antibiotics as ordered  Drink adequate amounts of fluids    . Visit Primary Care Provider or Endocrinologist at least 2 times per year    On track    Primary care visit 03/13/19 Endocrinology 04/15/19 Please schedule your annual wellness visit     Derek [name] is not meeting diabetes self management goal of hemoglobin A1C of <7% with last reading of 9.8% Derek [name] received first COVID vaccination on 03/27/19 Derek [name] is scheduled for TURP on 04/21/19 Derek [name] is actively engaged with [facility] for complex management. Reviewed clients plan of care in [facility] electronic medical record. Last seen by provider on  04/14/19.   Plan to review [facility]'s plan of care and will collaborated with [facility] team as needed  Plan:  Send successful outreach letter with a copy of their individualized care plan, Send individual care plan to provider and Send educational material-EMMI: Chronic Pain, Chronic bronchitis  Chronic care management coordination will outreach in:  3 Months     [place], [facility], [place] Management Coordinator [facility] [phone]

## 2019-04-21 ENCOUNTER — Observation Stay (HOSPITAL_BASED_OUTPATIENT_CLINIC_OR_DEPARTMENT_OTHER)
Admission: RE | Admit: 2019-04-21 | Discharge: 2019-04-22 | Disposition: A | Discharge status: Home / Self Care | Payer: HMO | Source: Ambulatory Visit | Attending: Urology | Admitting: Urology

## 2019-04-21 ENCOUNTER — Other Ambulatory Visit: Payer: Self-pay

## 2019-04-21 ENCOUNTER — Encounter (HOSPITAL_BASED_OUTPATIENT_CLINIC_OR_DEPARTMENT_OTHER): Payer: Self-pay | Admitting: Urology

## 2019-04-21 ENCOUNTER — Ambulatory Visit (HOSPITAL_BASED_OUTPATIENT_CLINIC_OR_DEPARTMENT_OTHER): Payer: HMO | Admitting: Anesthesiology

## 2019-04-21 ENCOUNTER — Encounter (HOSPITAL_BASED_OUTPATIENT_CLINIC_OR_DEPARTMENT_OTHER)
Admission: RE | Disposition: A | Discharge status: Home / Self Care | Payer: Self-pay | Source: Ambulatory Visit | Attending: Urology

## 2019-04-21 DIAGNOSIS — R338 Other retention of urine: Secondary | ICD-10-CM | POA: Insufficient documentation

## 2019-04-21 DIAGNOSIS — N400 Enlarged prostate without lower urinary tract symptoms: Secondary | ICD-10-CM | POA: Diagnosis not present

## 2019-04-21 DIAGNOSIS — Z885 Allergy status to narcotic agent status: Secondary | ICD-10-CM | POA: Insufficient documentation

## 2019-04-21 DIAGNOSIS — H8109 Meniere's disease, unspecified ear: Secondary | ICD-10-CM | POA: Insufficient documentation

## 2019-04-21 DIAGNOSIS — G4733 Obstructive sleep apnea (adult) (pediatric): Secondary | ICD-10-CM | POA: Diagnosis not present

## 2019-04-21 DIAGNOSIS — N138 Other obstructive and reflux uropathy: Secondary | ICD-10-CM

## 2019-04-21 DIAGNOSIS — K219 Gastro-esophageal reflux disease without esophagitis: Secondary | ICD-10-CM | POA: Insufficient documentation

## 2019-04-21 DIAGNOSIS — N320 Bladder-neck obstruction: Secondary | ICD-10-CM | POA: Insufficient documentation

## 2019-04-21 DIAGNOSIS — Z79899 Other long term (current) drug therapy: Secondary | ICD-10-CM | POA: Diagnosis not present

## 2019-04-21 DIAGNOSIS — I4510 Unspecified right bundle-branch block: Secondary | ICD-10-CM | POA: Insufficient documentation

## 2019-04-21 DIAGNOSIS — F329 Major depressive disorder, single episode, unspecified: Secondary | ICD-10-CM | POA: Diagnosis not present

## 2019-04-21 DIAGNOSIS — F419 Anxiety disorder, unspecified: Secondary | ICD-10-CM | POA: Insufficient documentation

## 2019-04-21 DIAGNOSIS — Z87442 Personal history of urinary calculi: Secondary | ICD-10-CM | POA: Insufficient documentation

## 2019-04-21 DIAGNOSIS — Z808 Family history of malignant neoplasm of other organs or systems: Secondary | ICD-10-CM | POA: Insufficient documentation

## 2019-04-21 DIAGNOSIS — R05 Cough: Secondary | ICD-10-CM | POA: Diagnosis not present

## 2019-04-21 DIAGNOSIS — I10 Essential (primary) hypertension: Secondary | ICD-10-CM | POA: Diagnosis not present

## 2019-04-21 DIAGNOSIS — Z833 Family history of diabetes mellitus: Secondary | ICD-10-CM | POA: Insufficient documentation

## 2019-04-21 DIAGNOSIS — E1151 Type 2 diabetes mellitus with diabetic peripheral angiopathy without gangrene: Secondary | ICD-10-CM | POA: Diagnosis not present

## 2019-04-21 DIAGNOSIS — E119 Type 2 diabetes mellitus without complications: Secondary | ICD-10-CM | POA: Diagnosis not present

## 2019-04-21 DIAGNOSIS — Z794 Long term (current) use of insulin: Secondary | ICD-10-CM | POA: Insufficient documentation

## 2019-04-21 DIAGNOSIS — Z96653 Presence of artificial knee joint, bilateral: Secondary | ICD-10-CM | POA: Insufficient documentation

## 2019-04-21 DIAGNOSIS — M5136 Other intervertebral disc degeneration, lumbar region: Secondary | ICD-10-CM | POA: Insufficient documentation

## 2019-04-21 DIAGNOSIS — Z7982 Long term (current) use of aspirin: Secondary | ICD-10-CM | POA: Diagnosis not present

## 2019-04-21 DIAGNOSIS — Z90411 Acquired partial absence of pancreas: Secondary | ICD-10-CM | POA: Insufficient documentation

## 2019-04-21 DIAGNOSIS — F410 Panic disorder [episodic paroxysmal anxiety] without agoraphobia: Secondary | ICD-10-CM | POA: Diagnosis not present

## 2019-04-21 DIAGNOSIS — D6851 Activated protein C resistance: Secondary | ICD-10-CM | POA: Insufficient documentation

## 2019-04-21 DIAGNOSIS — Z8249 Family history of ischemic heart disease and other diseases of the circulatory system: Secondary | ICD-10-CM | POA: Insufficient documentation

## 2019-04-21 DIAGNOSIS — Z8507 Personal history of malignant neoplasm of pancreas: Secondary | ICD-10-CM | POA: Diagnosis not present

## 2019-04-21 DIAGNOSIS — Z888 Allergy status to other drugs, medicaments and biological substances status: Secondary | ICD-10-CM | POA: Insufficient documentation

## 2019-04-21 DIAGNOSIS — Z9081 Acquired absence of spleen: Secondary | ICD-10-CM | POA: Insufficient documentation

## 2019-04-21 DIAGNOSIS — N401 Enlarged prostate with lower urinary tract symptoms: Secondary | ICD-10-CM | POA: Diagnosis not present

## 2019-04-21 DIAGNOSIS — Z881 Allergy status to other antibiotic agents status: Secondary | ICD-10-CM | POA: Insufficient documentation

## 2019-04-21 DIAGNOSIS — M1990 Unspecified osteoarthritis, unspecified site: Secondary | ICD-10-CM | POA: Diagnosis not present

## 2019-04-21 DIAGNOSIS — Z87820 Personal history of traumatic brain injury: Secondary | ICD-10-CM | POA: Insufficient documentation

## 2019-04-21 DIAGNOSIS — Z87891 Personal history of nicotine dependence: Secondary | ICD-10-CM | POA: Insufficient documentation

## 2019-04-21 DIAGNOSIS — Z832 Family history of diseases of the blood and blood-forming organs and certain disorders involving the immune mechanism: Secondary | ICD-10-CM | POA: Insufficient documentation

## 2019-04-21 HISTORY — DX: Dyspnea, unspecified: R06.00

## 2019-04-21 HISTORY — PX: TRANSURETHRAL RESECTION OF PROSTATE: SHX73

## 2019-04-21 LAB — POCT I-STAT, CHEM 8
BUN: 16 mg/dL (ref 8–23)
Calcium, Ion: 1.45 mmol/L — ABNORMAL HIGH (ref 1.15–1.40)
Chloride: 104 mmol/L (ref 98–111)
Creatinine, Ser: 0.7 mg/dL (ref 0.61–1.24)
Glucose, Bld: 152 mg/dL — ABNORMAL HIGH (ref 70–99)
HCT: 38 % — ABNORMAL LOW (ref 39.0–52.0)
Hemoglobin: 12.9 g/dL — ABNORMAL LOW (ref 13.0–17.0)
Potassium: 3.9 mmol/L (ref 3.5–5.1)
Sodium: 138 mmol/L (ref 135–145)
TCO2: 26 mmol/L (ref 22–32)

## 2019-04-21 LAB — GLUCOSE, CAPILLARY
Glucose-Capillary: 175 mg/dL — ABNORMAL HIGH (ref 70–99)
Glucose-Capillary: 207 mg/dL — ABNORMAL HIGH (ref 70–99)
Glucose-Capillary: 247 mg/dL — ABNORMAL HIGH (ref 70–99)
Glucose-Capillary: 289 mg/dL — ABNORMAL HIGH (ref 70–99)

## 2019-04-21 SURGERY — TURP (TRANSURETHRAL RESECTION OF PROSTATE)
Anesthesia: General | Site: Prostate

## 2019-04-21 MED ORDER — SENNA 8.6 MG PO TABS
1.0000 | ORAL_TABLET | Freq: Two times a day (BID) | ORAL | Status: DC
  Administered 2019-04-21 (×2): 8.6 mg via ORAL
  Filled 2019-04-21: qty 1

## 2019-04-21 MED ORDER — INSULIN ASPART 100 UNIT/ML ~~LOC~~ SOLN
SUBCUTANEOUS | Status: AC
  Filled 2019-04-21: qty 1

## 2019-04-21 MED ORDER — MEPERIDINE HCL 25 MG/ML IJ SOLN
6.2500 mg | INTRAMUSCULAR | Status: DC | PRN
  Filled 2019-04-21: qty 1

## 2019-04-21 MED ORDER — SODIUM CHLORIDE 0.45 % IV SOLN
INTRAVENOUS | Status: DC
  Filled 2019-04-21 (×2): qty 1000

## 2019-04-21 MED ORDER — HYDROMORPHONE HCL 1 MG/ML IJ SOLN
INTRAMUSCULAR | Status: DC | PRN
  Administered 2019-04-21 (×2): 1 mg via INTRAVENOUS

## 2019-04-21 MED ORDER — PROPOFOL 10 MG/ML IV BOLUS
INTRAVENOUS | Status: AC
  Filled 2019-04-21: qty 20

## 2019-04-21 MED ORDER — INSULIN NPH (HUMAN) (ISOPHANE) 100 UNIT/ML ~~LOC~~ SUSP
30.0000 [IU] | Freq: Every day | SUBCUTANEOUS | Status: DC
  Administered 2019-04-21: 30 [IU] via SUBCUTANEOUS
  Filled 2019-04-21: qty 10

## 2019-04-21 MED ORDER — CIPROFLOXACIN HCL 500 MG PO TABS
500.0000 mg | ORAL_TABLET | Freq: Two times a day (BID) | ORAL | Status: DC
  Administered 2019-04-21 – 2019-04-22 (×3): 500 mg via ORAL
  Filled 2019-04-21 (×4): qty 1

## 2019-04-21 MED ORDER — HYDROMORPHONE HCL 2 MG/ML IJ SOLN
INTRAMUSCULAR | Status: AC
  Filled 2019-04-21: qty 1

## 2019-04-21 MED ORDER — INSULIN ASPART 100 UNIT/ML ~~LOC~~ SOLN
0.0000 [IU] | Freq: Three times a day (TID) | SUBCUTANEOUS | Status: DC
  Filled 2019-04-21: qty 0.15

## 2019-04-21 MED ORDER — HYDROMORPHONE HCL 8 MG PO TABS
8.0000 mg | ORAL_TABLET | Freq: Once | ORAL | Status: AC
  Administered 2019-04-21: 8 mg via ORAL
  Filled 2019-04-21: qty 1

## 2019-04-21 MED ORDER — OXYBUTYNIN CHLORIDE 5 MG PO TABS
ORAL_TABLET | ORAL | Status: AC
  Filled 2019-04-21: qty 1

## 2019-04-21 MED ORDER — HYDROMORPHONE HCL 2 MG PO TABS
ORAL_TABLET | ORAL | Status: AC
  Filled 2019-04-21: qty 4

## 2019-04-21 MED ORDER — HYDROMORPHONE HCL 1 MG/ML IJ SOLN
0.2500 mg | INTRAMUSCULAR | Status: DC | PRN
  Administered 2019-04-21 (×2): 0.5 mg via INTRAVENOUS
  Filled 2019-04-21: qty 0.5

## 2019-04-21 MED ORDER — CEFAZOLIN SODIUM-DEXTROSE 2-4 GM/100ML-% IV SOLN
INTRAVENOUS | Status: AC
  Filled 2019-04-21: qty 100

## 2019-04-21 MED ORDER — INSULIN ASPART 100 UNIT/ML ~~LOC~~ SOLN
10.0000 [IU] | Freq: Three times a day (TID) | SUBCUTANEOUS | Status: DC
  Administered 2019-04-21 – 2019-04-22 (×3): 25 [IU] via SUBCUTANEOUS
  Filled 2019-04-21: qty 0.35

## 2019-04-21 MED ORDER — PHENYLEPHRINE HCL (PRESSORS) 10 MG/ML IV SOLN
INTRAVENOUS | Status: DC | PRN
  Administered 2019-04-21 (×2): 80 ug via INTRAVENOUS

## 2019-04-21 MED ORDER — ONDANSETRON HCL 4 MG/2ML IJ SOLN
4.0000 mg | Freq: Once | INTRAMUSCULAR | Status: DC | PRN
  Filled 2019-04-21: qty 2

## 2019-04-21 MED ORDER — SENNA 8.6 MG PO TABS
ORAL_TABLET | ORAL | Status: AC
  Filled 2019-04-21: qty 1

## 2019-04-21 MED ORDER — PROPOFOL 10 MG/ML IV BOLUS
INTRAVENOUS | Status: DC | PRN
  Administered 2019-04-21: 150 mg via INTRAVENOUS

## 2019-04-21 MED ORDER — INSULIN NPH (HUMAN) (ISOPHANE) 100 UNIT/ML ~~LOC~~ SUSP
40.0000 [IU] | Freq: Every day | SUBCUTANEOUS | Status: DC
  Administered 2019-04-21: 40 [IU] via SUBCUTANEOUS
  Filled 2019-04-21: qty 10

## 2019-04-21 MED ORDER — ZOLPIDEM TARTRATE 5 MG PO TABS
ORAL_TABLET | ORAL | Status: AC
  Filled 2019-04-21: qty 1

## 2019-04-21 MED ORDER — HYDROMORPHONE HCL 1 MG/ML IJ SOLN
INTRAMUSCULAR | Status: AC
  Filled 2019-04-21: qty 1

## 2019-04-21 MED ORDER — FENTANYL CITRATE (PF) 100 MCG/2ML IJ SOLN
INTRAMUSCULAR | Status: DC | PRN
  Administered 2019-04-21 (×2): 50 ug via INTRAVENOUS

## 2019-04-21 MED ORDER — HYDROMORPHONE HCL 8 MG PO TABS
8.0000 mg | ORAL_TABLET | Freq: Four times a day (QID) | ORAL | Status: DC | PRN
  Administered 2019-04-21 – 2019-04-22 (×3): 8 mg via ORAL
  Filled 2019-04-21 (×2): qty 1

## 2019-04-21 MED ORDER — OXYBUTYNIN CHLORIDE 5 MG PO TABS
5.0000 mg | ORAL_TABLET | Freq: Three times a day (TID) | ORAL | Status: DC | PRN
  Administered 2019-04-21: 5 mg via ORAL
  Filled 2019-04-21: qty 1

## 2019-04-21 MED ORDER — CARVEDILOL PHOSPHATE ER 20 MG PO CP24
20.0000 mg | ORAL_CAPSULE | Freq: Every day | ORAL | Status: DC
  Administered 2019-04-21: 20 mg via ORAL
  Filled 2019-04-21: qty 1

## 2019-04-21 MED ORDER — ZOLPIDEM TARTRATE 5 MG PO TABS
5.0000 mg | ORAL_TABLET | Freq: Every evening | ORAL | Status: DC | PRN
  Administered 2019-04-21: 5 mg via ORAL
  Filled 2019-04-21: qty 1

## 2019-04-21 MED ORDER — INSULIN NPH (HUMAN) (ISOPHANE) 100 UNIT/ML ~~LOC~~ SUSP
50.0000 [IU] | Freq: Every day | SUBCUTANEOUS | Status: DC
  Administered 2019-04-22: 50 [IU] via SUBCUTANEOUS
  Filled 2019-04-21 (×2): qty 10

## 2019-04-21 MED ORDER — ONDANSETRON HCL 4 MG/2ML IJ SOLN
INTRAMUSCULAR | Status: AC
  Filled 2019-04-21: qty 2

## 2019-04-21 MED ORDER — HYDROMORPHONE HCL 1 MG/ML IJ SOLN
0.2500 mg | INTRAMUSCULAR | Status: DC | PRN
  Administered 2019-04-21 (×4): 0.5 mg via INTRAVENOUS
  Filled 2019-04-21: qty 0.5

## 2019-04-21 MED ORDER — LIDOCAINE HCL (CARDIAC) PF 100 MG/5ML IV SOSY
PREFILLED_SYRINGE | INTRAVENOUS | Status: DC | PRN
  Administered 2019-04-21: 100 mg via INTRAVENOUS

## 2019-04-21 MED ORDER — PANTOPRAZOLE SODIUM 20 MG PO TBEC
20.0000 mg | DELAYED_RELEASE_TABLET | Freq: Every day | ORAL | Status: DC
  Filled 2019-04-21: qty 1

## 2019-04-21 MED ORDER — LIDOCAINE 2% (20 MG/ML) 5 ML SYRINGE
INTRAMUSCULAR | Status: AC
  Filled 2019-04-21: qty 5

## 2019-04-21 MED ORDER — PHENYLEPHRINE 40 MCG/ML (10ML) SYRINGE FOR IV PUSH (FOR BLOOD PRESSURE SUPPORT)
PREFILLED_SYRINGE | INTRAVENOUS | Status: AC
  Filled 2019-04-21: qty 10

## 2019-04-21 MED ORDER — SODIUM CHLORIDE 0.9 % IR SOLN
Status: DC | PRN
  Administered 2019-04-21: 6000 mL

## 2019-04-21 MED ORDER — DEXAMETHASONE SODIUM PHOSPHATE 4 MG/ML IJ SOLN
INTRAMUSCULAR | Status: DC | PRN
  Administered 2019-04-21: 5 mg via INTRAVENOUS

## 2019-04-21 MED ORDER — ONDANSETRON HCL 4 MG/2ML IJ SOLN
INTRAMUSCULAR | Status: DC | PRN
  Administered 2019-04-21: 4 mg via INTRAVENOUS

## 2019-04-21 MED ORDER — FENTANYL CITRATE (PF) 100 MCG/2ML IJ SOLN
INTRAMUSCULAR | Status: AC
  Filled 2019-04-21: qty 2

## 2019-04-21 MED ORDER — CEFAZOLIN SODIUM-DEXTROSE 2-4 GM/100ML-% IV SOLN
2.0000 g | INTRAVENOUS | Status: AC
  Administered 2019-04-21: 2 g via INTRAVENOUS
  Filled 2019-04-21: qty 100

## 2019-04-21 MED ORDER — LOSARTAN POTASSIUM 50 MG PO TABS
50.0000 mg | ORAL_TABLET | Freq: Every day | ORAL | Status: DC
  Administered 2019-04-21: 50 mg via ORAL
  Filled 2019-04-21: qty 1

## 2019-04-21 MED ORDER — LACTATED RINGERS IV SOLN
INTRAVENOUS | Status: DC
  Filled 2019-04-21: qty 1000

## 2019-04-21 MED ORDER — ACETAMINOPHEN 325 MG PO TABS
650.0000 mg | ORAL_TABLET | ORAL | Status: DC | PRN
  Filled 2019-04-21: qty 2

## 2019-04-21 SURGICAL SUPPLY — 23 items
BAG DRAIN URO-CYSTO SKYTR STRL (DRAIN) ×2 IMPLANT
BAG DRN RND TRDRP ANRFLXCHMBR (UROLOGICAL SUPPLIES) ×1
BAG DRN UROCATH (DRAIN) ×1
BAG URINE DRAIN 2000ML AR STRL (UROLOGICAL SUPPLIES) ×2 IMPLANT
CATH FOLEY 3WAY 30CC 22FR (CATHETERS) ×1 IMPLANT
CLOTH BEACON ORANGE TIMEOUT ST (SAFETY) ×2 IMPLANT
ELECT REM PT RETURN 9FT ADLT (ELECTROSURGICAL) ×2
ELECTRODE REM PT RTRN 9FT ADLT (ELECTROSURGICAL) ×1 IMPLANT
EVACUATOR MICROVAS BLADDER (UROLOGICAL SUPPLIES) ×1 IMPLANT
GLOVE BIO SURGEON STRL SZ8 (GLOVE) ×2 IMPLANT
GLOVE BIOGEL PI IND STRL 8.5 (GLOVE) IMPLANT
GLOVE BIOGEL PI INDICATOR 8.5 (GLOVE) ×1
GLOVE SURG SS PI 8.5 STRL IVOR (GLOVE) ×1
GLOVE SURG SS PI 8.5 STRL STRW (GLOVE) IMPLANT
GOWN STRL REUS W/TWL XL LVL3 (GOWN DISPOSABLE) ×3 IMPLANT
HOLDER FOLEY CATH W/STRAP (MISCELLANEOUS) ×1 IMPLANT
KIT TURNOVER CYSTO (KITS) ×2 IMPLANT
LOOP CUT BIPOLAR 24F LRG (ELECTROSURGICAL) ×2 IMPLANT
MANIFOLD NEPTUNE II (INSTRUMENTS) ×2 IMPLANT
PACK CYSTO (CUSTOM PROCEDURE TRAY) ×2 IMPLANT
SYR 30ML LL (SYRINGE) ×1 IMPLANT
TUBE CONNECTING 12X1/4 (SUCTIONS) ×2 IMPLANT
TUBING UROLOGY SET (TUBING) ×1 IMPLANT

## 2019-04-21 NOTE — Discharge Instructions (Signed)
Transurethral Resection of the Prostate ° °Care After ° °Refer to this sheet in the next few weeks. These discharge instructions provide you with general information on caring for yourself after you leave the hospital. Your caregiver may also give you specific instructions. Your treatment has been planned according to the most current medical practices available, but unavoidable complications sometimes occur. If you have any problems or questions after discharge, please call your caregiver. ° °HOME CARE INSTRUCTIONS  ° °Medications °· You may receive medicine for pain management. As your level of discomfort decreases, adjustments in your pain medicines may be made.  °· Take all medicines as directed.  °· You may be given a medicine (antibiotic) to kill germs following surgery. Finish all medicines. Let your caregiver know if you have any side effects or problems from the medicine.  °· If you are on aspirin, it would be best not to restart the aspirin until the blood in the urine clears °Hygiene °· You can take a shower after surgery.  °· You should not take a bath while you still have the urethral catheter. °Activity °· You will be encouraged to get out of bed as much as possible and increase your activity level as tolerated.  °· Spend the first week in and around your home. For 3 weeks, avoid the following:  °· Straining.  °· Running.  °· Strenuous work.  °· Walks longer than a few blocks.  °· Riding for extended periods.  °· Sexual relations.  °· Do not lift heavy objects (more than 20 pounds) for at least 1 month. When lifting, use your arms instead of your abdominal muscles.  °· You will be encouraged to walk as tolerated. Do not exert yourself. Increase your activity level slowly. Remember that it is important to keep moving after an operation of any type. This cuts down on the possibility of developing blood clots.  °· Your caregiver will tell you when you can resume driving and light housework. Discuss this  at your first office visit after discharge. °Diet °· No special diet is ordered after a TURP. However, if you are on a special diet for another medical problem, it should be continued.  °· Normal fluid intake is usually recommended.  °· Avoid alcohol and caffeinated drinks for 2 weeks. They irritate the bladder. Decaffeinated drinks are okay.  °· Avoid spicy foods.  °Bladder Function °· For the first 10 days, empty the bladder whenever you feel a definite desire. Do not try to hold the urine for long periods of time.  °· Urinating once or twice a night even after you are healed is not uncommon.  °· You may see some recurrence of blood in the urine after discharge from the hospital. This usually happens within 2 weeks after the procedure.If this occurs, force fluids again as you did in the hospital and reduce your activity.  °Bowel Function °· You may experience some constipation after surgery. This can be minimized by increasing fluids and fiber in your diet. Drink enough water and fluids to keep your urine clear or pale yellow.  °· A stool softener may be prescribed for use at home. Do not strain to move your bowels.  °· If you are requiring increased pain medicine, it is important that you take stool softeners to prevent constipation. This will help to promote proper healing by reducing the need to strain to move your bowels.  °Sexual Activity °· Semen movement in the opposite direction and into the bladder (  retrograde ejaculation) may occur. Since the semen passes into the bladder, cloudy urine can occur the first time you urinate after intercourse. Or, you may not have an ejaculation during erection. Ask your caregiver when you can resume sexual activity. Retrograde ejaculation and reduced semen discharge should not reduce one's pleasure of intercourse.  °Postoperative Visit °· Arrange the date and time of your after surgery visit with your caregiver.  °Return to Work °· After your recovery is complete, you will  be able to return to work and resume all activities. Your caregiver will inform you when you can return to work.  °Foley Catheter Care °A soft, flexible tube (Foley catheter) may have been placed in your bladder to drain urine and fluid. Follow these instructions: °Taking Care of the Catheter °· Keep the area where the catheter leaves your body clean.  °· Attach the catheter to the leg so there is no tension on the catheter.  °· Keep the drainage bag below the level of the bladder, but keep it OFF the floor.  °· Do not take [place] soaking baths. Your caregiver will give instructions about showering.  °· Wash your hands before touching ANYTHING related to the catheter or bag.  °· Using mild soap and warm water on a washcloth:  °· Clean the area closest to the catheter insertion site using a circular motion around the catheter.  °· Clean the catheter itself by wiping AWAY from the insertion site for several inches down the tube.  °· NEVER wipe upward as this could sweep bacteria up into the urethra (tube in your body that normally drains the bladder) and cause infection.  °· Place a small amount of sterile lubricant at the tip of the penis where the catheter is entering.  °Taking Care of the Drainage Bags °· Two drainage bags may be taken home: a large overnight drainage bag, and a smaller leg bag which fits underneath clothing.  °· It is okay to wear the overnight bag at any time, but NEVER wear the smaller leg bag at night.  °· Keep the drainage bag well below the level of your bladder. This prevents backflow of urine into the bladder and allows the urine to drain freely.  °· Anchor the tubing to your leg to prevent pulling or tension on the catheter. Use tape or a leg strap provided by the hospital.  °· Empty the drainage bag when it is 1/2 to 3/4 full. Wash your hands before and after touching the bag.  °· Periodically check the tubing for kinks to make sure there is no pressure on the tubing which could restrict  the flow of urine.  °Changing the Drainage Bags °· Cleanse both ends of the clean bag with alcohol before changing.  °· Pinch off the rubber catheter to avoid urine spillage during the disconnection.  °· Disconnect the dirty bag and connect the clean one.  °· Empty the dirty bag carefully to avoid a urine spill.  °· Attach the new bag to the leg with tape or a leg strap.  °Cleaning the Drainage Bags °· Whenever a drainage bag is disconnected, it must be cleaned quickly so it is ready for the next use.  °· Wash the bag in warm, soapy water.  °· Rinse the bag thoroughly with warm water.  °· Soak the bag for 30 minutes in a solution of white vinegar and water (1 cup vinegar to 1 quart warm water).  °· Rinse with warm water.  °SEEK MEDICAL   CARE IF:   You have chills or night sweats.   You are leaking around your catheter or have problems with your catheter. It is not uncommon to have sporadic leakage around your catheter as a result of bladder spasms. If the leakage stops, there is not much need for concern. If you are uncertain, call your caregiver.   You develop side effects that you think are coming from your medicines.  SEEK IMMEDIATE MEDICAL CARE IF:   You are suddenly unable to urinate. Check to see if there are any kinks in the drainage tubing that may cause this. If you cannot find any kinks, call your caregiver immediately. This is an emergency.   You develop shortness of breath or chest pains.   Bleeding persists or clots develop in your urine.   You have a fever.   You develop pain in your back or over your lower belly (abdomen).   You develop pain or swelling in your legs.   Any problems you are having get worse rather than better.  MAKE SURE YOU:   Understand these instructions.   Will watch your condition.   Will get help right away if you are not doing well or get worse.

## 2019-04-21 NOTE — H&P (Signed)
H&P  Chief Complaint:   Urinary retention  History of Present Illness:  74 year old male with history of BPH and prior to his Urolift he was on self catheterization.  Unfortunately, Urolift  Performed in August 2018 failed to relieve his obstruction.  He has continued on self catheterization.  He presents at this time for TURP to relieve his obstruction.  Past Medical History:  Diagnosis Date  . Anxiety   . Arthritis   . Bilateral leg cramps    takes magnesium  . BPH (benign prostatic hyperplasia)   . DDD (degenerative disc disease), lumbar    gets back injections   . Deafness in right ear    meniere's disease  . Depression   . Dermatitis    bilateral hands  . 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 Factor V Leiden mutation ([place])    "never had blood clots"  . GERD (gastroesophageal reflux disease)   . Hiatal hernia   . History of kidney stones    multiple kidney stones-not a problem at present  . 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 History of panic attacks   . 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 Hypertension   . Incomplete right bundle branch block   . Insulin dependent type 2 diabetes mellitus, uncontrolled ([place]) followed by dr [name] ([place])   dx 2007--- ltype 1 now since jan 2017 surgery with part of pancreas removed  . Lower urinary tract symptoms (LUTS)   . Meniere's disease dx 1970s   intermittant vertigo  . Obstructive sleep apnea    " i used to have sleep apnea" never used a c-pap   . Open wound of abdomen    WET/DRY DRESSING CHANGES TID--- POST ABD. SURGERY 09-21-2016 healed now  . Peripheral vascular disease (HCC)    right leg - decreased pulse   . Wears partial dentures    LOWER  . 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 ANAL FISTULECTOMY N/A 04/01/2013   Procedure: EXAM UNDER ANESTHESIA WITH ANAL FISSURectomy, sphincterotomy and internal hemorrhoidectomy;  Surgeon: Harl Bowie, MD;  Location: [place];  Service: General;  Laterality: N/A;  . APPLICATION OF WOUND VAC N/A 05/16/2015   Procedure: APPLICATION OF WOUND VAC;  Surgeon: Armandina Gemma, MD;  Location: [facility];  Service: General;  Laterality: N/A;  . APPLICATION OF WOUND VAC N/A 05/20/2015   Procedure: EXHANGE OF ABDOMINAL  WOUND VAC DRESSING;  Surgeon: Armandina Gemma, MD;  Location: [facility];  Service: General;  Laterality: N/A;  . COLONOSCOPY    . CYSTOSCOPY WITH INSERTION OF UROLIFT N/A 10/12/2016   Procedure: CYSTOSCOPY WITH INSERTION OF UROLIFT;  Surgeon: Franchot Gallo, MD;  Location[facility];  Service: Urology;  Laterality: N/A;  . EUS N/A 12/31/2014   Procedure: UPPER ENDOSCOPIC ULTRASOUND (EUS) LINEAR;  Surgeon: Milus Banister, MD;  [facility];  Service: Endoscopy;  Laterality: N/A;  . HARDWARE REMOVAL Left 2013   left ankle  . HEMORRHOIDECTOMY WITH HEMORRHOID BANDING    . INCISION AND DRAINAGE ABSCESS N/A 05/14/2015   Procedure: DRAINAGE OF INFECTED PANCREATIC PSEUDOCYST;  Surgeon: Armandina Gemma, MD;  [facility];  Service: General;  Laterality: N/A;  . INCISION AND DRAINAGE OF WOUND N/A 05/16/2015   Procedure: IRRIGATION AND DEBRIDEMENT WOUND;  Surgeon: Armandina Gemma, MD;  [facility];  Service: General;  Laterality: N/A;  . INCISIONAL HERNIA REPAIR N/A 11/12/2015   Procedure: [place];  Surgeon: Armandina Gemma, MD;  Location: [place];  Service: General;  Laterality: N/A;  . INGUINAL HERNIA REPAIR Right 1974;  1982  . INSERTION OF MESH N/A 11/12/2015   Procedure: INSERTION OF MESH;  Surgeon: Armandina Gemma, MD;  Location: [place];  Service: General;  Laterality: N/A;  . IR GENERIC HISTORICAL   04/20/2015   IR RADIOLOGIST EVAL & MGMT 04/20/2015 [facility] INTERV RAD  . KNEE ARTHROSCOPY Bilateral right 08-08-2000;  left 11-23-2000   meniscal repair and chondroplasty's  . KNEE ARTHROSCOPY  03/01/2012   Procedure: ARTHROSCOPY KNEE;  Surgeon: Gearlean Alf, MD;  Location[facility];  Service: Orthopedics;  Laterality: Left;  WITH SYNOVECTOMY   . LAPAROTOMY N/A 05/14/2015   Procedure: EXPLORATORY LAPAROTOMY;  Surgeon: Armandina Gemma, MD;  [facility];  Service: General;  Laterality: N/A;  . ORIF LEFT ANKLE FX  08/20/2009   distal tib-fib and malleolus  . ROTATOR CUFF REPAIR Bilateral right 1994; left 1993, left 1993  . TOTAL KNEE ARTHROPLASTY Right 06/02/2013   Procedure: RIGHT TOTAL KNEE ARTHROPLASTY;  Surgeon: Gearlean Alf, MD;  [facility];  Service: Orthopedics;  Laterality: Right;  . TOTAL KNEE ARTHROPLASTY Left 01-31-2008   dr Wynelle Link  . TOTAL KNEE REVISION Right 01/23/2018   Procedure: right knee polyethylene revision;  Surgeon: Gaynelle Arabian, MD;  [facility];  Service: Orthopedics;  Laterality: Right;  14min  . UMBILICAL HERNIA REPAIR  08-11-1999   dr Ninfa Linden  . UPPER GASTROINTESTINAL ENDOSCOPY  sept 2016  . WOUND EXPLORATION N/A 09/21/2016   Procedure: WOUND EXPLORATION AND DEBRIDEMENT ABDOMINAL WALL;  Surgeon: Armandina Gemma, MD;  [facility];  Service: General;  Laterality: N/A;    Home Medications:    Allergies:  Allergies  Allergen Reactions  . Oxycontin [Oxycodone Hcl] Swelling and Other (See Comments)    Lips swells. Tolerates immediate-release oxycodone as well as hydrocodone.  . Quinine Other (See Comments)    Platelets dropped  . Voltaren [Diclofenac Sodium] Other (See Comments)    Elevated liver enzymes  . Doxycycline Other (See Comments)    Severe nose bleeds  . Morphine And Related Nausea And Vomiting    Family History  Problem Relation Age of Onset  . Bone cancer Father        Jaw  . Factor V Leiden deficiency Daughter    . Diabetes Maternal Grandmother   . Heart disease Paternal Uncle   . Colon cancer Neg Hx   . Esophageal cancer Neg Hx   . Stomach cancer Neg Hx   . Rectal cancer Neg Hx     Social History:  reports that he quit smoking about 44 years ago. His smoking use included cigarettes. He has a 68.00 pack-year smoking history. He has never used smokeless tobacco. He reports that he does not drink alcohol or use drugs.  ROS: A complete review of systems was performed.  All systems are negative except for pertinent findings as noted.  Physical Exam:  Vital signs in last 24 hours: BP (!) 153/73   Pulse 66   Temp 98.2 F (36.8 C) (Oral)   Resp 18  Ht 5\' 11"  (1.803 m)   Wt 124.3 kg   SpO2 97%   BMI 38.22 kg/m  Constitutional:  Alert and oriented, No acute distress Cardiovascular: Regular rate  Respiratory: Normal respiratory effort GI: Abdomen is soft, nontender, nondistended, no abdominal masses. No CVAT.  Genitourinary: Normal male phallus, testes are descended bilaterally and non-tender and without masses, scrotum is normal in appearance without lesions or masses, perineum is normal on inspection. Lymphatic: No lymphadenopathy Neurologic: Grossly intact, no focal deficits Psychiatric: Normal mood and affect  Laboratory Data:  Recent Labs    04/21/19 0820  HGB 12.9*  HCT 38.0*    Recent Labs    04/21/19 0820  NA 138  K 3.9  CL 104  GLUCOSE 152*  BUN 16  CREATININE 0.70     Results for orders placed or performed during the hospital encounter of 04/21/19 (from the past 24 hour(s))  I-STAT, chem 8     Status: Abnormal   Collection Time: 04/21/19  8:20 AM  Result Value Ref Range   Sodium 138 135 - 145 mmol/L   Potassium 3.9 3.5 - 5.1 mmol/L   Chloride 104 98 - 111 mmol/L   BUN 16 8 - 23 mg/dL   Creatinine, Ser 0.70 0.61 - 1.24 mg/dL   Glucose, Bld 152 (H) 70 - 99 mg/dL   Calcium, Ion 1.45 (H) 1.15 - 1.40 mmol/L   TCO2 26 22 - 32 mmol/L   Hemoglobin 12.9 (L) 13.0 -  17.0 g/dL   HCT 38.0 (L) 39.0 - 52.0 %   Recent Results (from the past 240 hour(s))  SARS CORONAVIRUS 2 (TAT 6-24 HRS) Nasopharyngeal Nasopharyngeal Swab     Status: None   Collection Time: 04/17/19  2:10 PM   Specimen: Nasopharyngeal Swab  Result Value Ref Range Status   SARS Coronavirus 2 NEGATIVE NEGATIVE Final    Comment: (NOTE) SARS-CoV-2 target nucleic acids are NOT DETECTED. The SARS-CoV-2 RNA is generally detectable in upper and lower respiratory specimens during the acute phase of infection. Negative results do not preclude SARS-CoV-2 infection, do not rule out co-infections with other pathogens, and should not be used as the sole basis for treatment or other patient management decisions. Negative results must be combined with clinical observations, patient history, and epidemiological information. The expected result is Negative. Fact Sheet for Patients: SugarRoll.be Fact Sheet for Healthcare Providers: https://www.woods-mathews.com/ This test is not yet approved or cleared by the [place] FDA and  has been authorized for detection and/or diagnosis of SARS-CoV-2 by FDA under an Emergency Use Authorization (EUA). This EUA will remain  in effect (meaning this test can be used) for the duration of the COVID-19 declaration under Section 56 4(b)(1) of the Act, 21 U.S.C. section 360bbb-3(b)(1), unless the authorization is terminated or revoked sooner. Performed at [facility], [place] [address], [place], [place] [address]     Renal Function: Recent Labs    04/21/19 0820  CREATININE 0.70   Estimated Creatinine Clearance: 108.7 mL/min (by C-G formula based on SCr of 0.7 mg/dL).  Radiologic Imaging: No results found.  Impression/Assessment:   BPH with obstruction  Plan:   TURP

## 2019-04-21 NOTE — Anesthesia Postprocedure Evaluation (Signed)
Anesthesia Post Note  Patient: Derek [name]  Procedure(s) Performed: TRANSURETHRAL RESECTION OF THE PROSTATE (TURP) (N/A Prostate)     Patient location during evaluation: PACU Anesthesia Type: General Level of consciousness: awake and alert Pain management: pain level controlled Vital Signs Assessment: post-procedure vital signs reviewed and stable Respiratory status: spontaneous breathing, nonlabored ventilation, respiratory function stable and patient connected to nasal cannula oxygen Cardiovascular status: blood pressure returned to baseline and stable Postop Assessment: no apparent nausea or vomiting Anesthetic complications: no    Last Vitals:  Vitals:   04/21/19 1130 04/21/19 1153  BP: (!) 147/74 (!) 159/71  Pulse: 65 63  Resp: 18 18  Temp:  36.5 C  SpO2: 98% 100%    Last Pain:  Vitals:   04/21/19 1153  TempSrc:   PainSc: 6                  Nayah Lukens DAVID

## 2019-04-21 NOTE — Progress Notes (Signed)
Patient complaining of severe back pain. Patient stated he takes Dilaudid 24mg  PO at night for the pain at home. Patient's BP 160/95. HR 95. Oxygen 97%. Dr Claudia Desanctis called to see if patient could get more pain medicine. Dilaudid 8mg  PO once was ordered. Will continue to monitor.

## 2019-04-21 NOTE — Interval H&P Note (Signed)
History and Physical Interval Note:  04/21/2019 9:18 AM  Derek [name]  has presented today for surgery, with the diagnosis of BENIGN PROSTATIC HYPERPLASIA.  The various methods of treatment have been discussed with the patient and family. After consideration of risks, benefits and other options for treatment, the patient has consented to  Procedure(s) with comments: [place] (TURP) (N/A) - 1 HR as a surgical intervention.  The patient's history has been reviewed, patient examined, no change in status, stable for surgery.  I have reviewed the patient's chart and labs.  Questions were answered to the patient's satisfaction.     [name] Braelynn Lupton

## 2019-04-21 NOTE — Anesthesia Procedure Notes (Signed)
Procedure Name: LMA Insertion Date/Time: 04/21/2019 9:37 AM Performed by: Bufford Spikes, CRNA Pre-anesthesia Checklist: Patient identified, Emergency Drugs available, Suction available and Patient being monitored Patient Re-evaluated:Patient Re-evaluated prior to induction Oxygen Delivery Method: Circle system utilized Preoxygenation: Pre-oxygenation with 100% oxygen Induction Type: IV induction Ventilation: Mask ventilation without difficulty LMA: LMA inserted LMA Size: 5.0 Number of attempts: 1 Airway Equipment and Method: Bite block Placement Confirmation: positive ETCO2 Tube secured with: Tape Dental Injury: Teeth and Oropharynx as per pre-operative assessment

## 2019-04-21 NOTE — Op Note (Signed)
Preoperative diagnosis: 1. Bladder outlet obstruction secondary to BPH  Postoperative diagnosis:  1. Bladder outlet obstruction secondary to BPH  Procedure:  1. Cystoscopy 2. Transurethral resection of the prostate  Surgeon: [name]. Karter Haire, M.D.  Anesthesia: General  Complications: None  Drain: Foley catheter  EBL: Minimal  Specimens: 1. Prostate chips 2. Urine for culture  Disposition of specimens: Pathology  Indication: Derek [name] is a patient with bladder outlet obstruction secondary to benign prostatic hyperplasia. After reviewing the management options for treatment, he elected to proceed with the above surgical procedure(s). We have discussed the potential benefits and risks of the procedure, side effects of the proposed treatment, the likelihood of the patient achieving the goals of the procedure, and any potential problems that might occur during the procedure or recuperation. Informed consent has been obtained.  Description of procedure:  The patient was identified in the holding area. He received preoperative antibiotics. He was then taken to the operating room. General anesthetic was administered.  The patient was then placed in the dorsal lithotomy position, prepped and draped in the usual sterile fashion. Timeout was then performed.  A resectoscope sheath was placed using the visual obturator.  The bladder was then systematically examined in its entirety. There was no evidence of  tumors, stones, or other mucosal pathology.The resectoscope, loop and telescope were then placed.  The ureteral orifices were identified so as to be avoided during the procedure.  The prostate adenoma was then resected utilizing loop cautery resection with the bipolar cutting loop.  The prostate adenoma from the bladder neck back to the verumontanum was resected beginning at the six o'clock position and then extended to include the right and left lobes of the prostate and  anterior prostate, respectively. Care was taken not to resect distal to the verumontanum.  Hemostasis was then achieved with the cautery and the bladder was emptied and reinspected with no significant bleeding noted at the end of the procedure.  Resected chips were irrigated from the bladder with the evacuator and sent to pathology.  A 3 way catheter was then placed into the bladder and placed on continuous bladder irrigation.  The patient appeared to tolerate the procedure well and without complications. The patient was able to be awakened and transferred to the recovery unit in satisfactory condition. He tolerated the procedure well.

## 2019-04-21 NOTE — Anesthesia Preprocedure Evaluation (Signed)
Anesthesia Evaluation  Patient identified by MRN, date of birth, ID band Patient awake    Reviewed: Allergy & Precautions, NPO status , Patient's Chart, lab work & pertinent test results  Airway Mallampati: I  TM Distance: >3 FB Neck ROM: Full    Dental   Pulmonary sleep apnea , former smoker,    Pulmonary exam normal        Cardiovascular hypertension, Pt. on medications Normal cardiovascular exam     Neuro/Psych Anxiety Depression    GI/Hepatic GERD  Medicated and Controlled,  Endo/Other  diabetes, Type 2, Insulin Dependent  Renal/GU      Musculoskeletal   Abdominal   Peds  Hematology   Anesthesia Other Findings   Reproductive/Obstetrics                             Anesthesia Physical Anesthesia Plan  ASA: III  Anesthesia Plan: General   Post-op Pain Management:    Induction: Intravenous  PONV Risk Score and Plan: 2 and Ondansetron and Midazolam  Airway Management Planned: LMA  Additional Equipment:   Intra-op Plan:   Post-operative Plan: Extubation in OR  Informed Consent: I have reviewed the patients History and Physical, chart, labs and discussed the procedure including the risks, benefits and alternatives for the proposed anesthesia with the patient or authorized representative who has indicated his/her understanding and acceptance.       Plan Discussed with: CRNA and Surgeon  Anesthesia Plan Comments:         Anesthesia Quick Evaluation

## 2019-04-21 NOTE — Transfer of Care (Signed)
Immediate Anesthesia Transfer of Care Note  Patient: Derek [name]  Procedure(s) Performed: TRANSURETHRAL RESECTION OF THE PROSTATE (TURP) (N/A Prostate)  Patient Location: PACU  Anesthesia Type:General  Level of Consciousness: awake, alert  and oriented  Airway & Oxygen Therapy: Patient Spontanous Breathing and Patient connected to nasal cannula oxygen  Post-op Assessment: Report given to RN and Post -op Vital signs reviewed and stable  Post vital signs: Reviewed and stable  Last Vitals:  Vitals Value Taken Time  BP 132/75 04/21/19 1011  Temp    Pulse 65 04/21/19 1014  Resp 9 04/21/19 1014  SpO2 100 % 04/21/19 1014  Vitals shown include unvalidated device data.  Last Pain:  Vitals:   04/21/19 0800  TempSrc: Oral  PainSc: 5       Patients Stated Pain Goal: 4 ([id number])  Complications: No apparent anesthesia complications

## 2019-04-22 ENCOUNTER — Ambulatory Visit: Payer: HMO | Attending: Internal Medicine

## 2019-04-22 DIAGNOSIS — N401 Enlarged prostate with lower urinary tract symptoms: Secondary | ICD-10-CM | POA: Diagnosis not present

## 2019-04-22 DIAGNOSIS — Z23 Encounter for immunization: Secondary | ICD-10-CM

## 2019-04-22 LAB — URINE CULTURE: Culture: NO GROWTH

## 2019-04-22 LAB — GLUCOSE, CAPILLARY: Glucose-Capillary: 249 mg/dL — ABNORMAL HIGH (ref 70–99)

## 2019-04-22 LAB — SURGICAL PATHOLOGY

## 2019-04-22 MED ORDER — INSULIN ASPART 100 UNIT/ML ~~LOC~~ SOLN
SUBCUTANEOUS | Status: AC
  Filled 2019-04-22: qty 1

## 2019-04-22 MED ORDER — CIPROFLOXACIN HCL 500 MG PO TABS: 500.0000 mg | ORAL_TABLET | Freq: Two times a day (BID) | ORAL | 0 refills | Status: DC

## 2019-04-22 MED FILL — LANSOPRAZOLE 30 MG CPDR: 30 | 90 days supply | Qty: 90 | Fill #0

## 2019-04-22 MED FILL — CIPROFLOXACIN HCL 500 MG TA: 500 | 3 days supply | Qty: 6 | Fill #0

## 2019-04-22 MED FILL — LOSARTAN POTASSIUM 50 MG TA: 50 | 90 days supply | Qty: 90 | Fill #0

## 2019-04-22 NOTE — Progress Notes (Signed)
   Covid-19 Vaccination Clinic  Name:  VEGAS TRINGALI    MRN: [id number] DOB: 04-15-1945  04/22/2019  Mr. Kazee was observed post Covid-19 immunization for 15 minutes without incident. He was provided with Vaccine Information Sheet and instruction to access the V-Safe system.   Mr. Pitstick was instructed to call 911 with any severe reactions post vaccine: Marland Kitchen Difficulty breathing  . Swelling of face and throat  . A fast heartbeat  . A bad rash all over body  . Dizziness and weakness   Immunizations Administered    Name Date Dose VIS Date Route   Pfizer COVID-19 Vaccine 04/22/2019 12:56 PM 0.3 mL 01/31/2019 Intramuscular   Manufacturer: [place]   Lot: [id number]   [place]: [id number]

## 2019-04-24 NOTE — Discharge Summary (Signed)
Physician Discharge Summary  Patient ID: Derek [name] MRN: [id number] DOB/AGE: 20-Sep-1945 74 y.o.  Admit date: 04/21/2019 Discharge date: 04/22/2019  Admission Diagnoses:  BPH Discharge Diagnoses:  Active Problems:   Enlarged prostate with urinary obstruction   Past Medical History:  Diagnosis Date  . Anxiety   . Arthritis   . Bilateral leg cramps    takes magnesium  . BPH (benign prostatic hyperplasia)   . DDD (degenerative disc disease), lumbar    gets back injections   . Deafness in right ear    meniere's disease  . Depression   . Dermatitis    bilateral hands  . 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 Factor V Leiden mutation ([place])    "never had blood clots"  . GERD (gastroesophageal reflux disease)   . Hiatal hernia   . History of kidney stones    multiple kidney stones-not a problem at present  . 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 History of panic attacks   . 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 Hypertension   . Incomplete right bundle branch block   . Insulin dependent type 2 diabetes mellitus, uncontrolled ([place]) followed by dr [name] ([place])   dx 2007--- ltype 1 now since jan 2017 surgery with part of pancreas removed  . Lower urinary tract symptoms (LUTS)   . Meniere's disease dx 1970s   intermittant vertigo  . Obstructive sleep apnea    " i used to have sleep apnea" never used a c-pap   . Open wound of abdomen    WET/DRY DRESSING CHANGES TID--- POST ABD. SURGERY 09-21-2016 healed now  . Peripheral vascular disease ([facility])    right leg - decreased pulse   . Wears partial dentures    LOWER  . 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 male who was admitted 04/21/2019 with a diagnosis of BPH and went to the operating room on 04/21/2019 and underwent the above named procedures. Urine was clear POD#1 and foley was removed and patient discharged home    He was given perioperative antibiotics:  Anti-infectives (From admission, onward)   Start     Dose/Rate Route Frequency Ordered Stop   04/22/19 0000  ciprofloxacin (CIPRO) 500 MG tablet     500 mg Oral Every 12 hours 04/22/19 0807     04/21/19 1215  ciprofloxacin (CIPRO) tablet 500 mg  Status:  Discontinued     500 mg Oral Every 12 hours 04/21/19 1213 04/22/19 1258   04/21/19 0726  ceFAZolin (ANCEF) IVPB 2g/100 mL premix     2 g 200 mL/hr over 30 Minutes Intravenous 30 min pre-op 04/21/19 0726 04/21/19 0937    .  He was given sequential compression devices, early ambulation,  for DVT prophylaxis.  He benefited maximally from the hospital stay and there were no complications.    Recent vital signs:  Vitals:   04/22/19 0515 04/22/19 0825  BP: (!) 166/87 (!) 146/65  Pulse: 78 85  Resp: 18 18  Temp: 98.2 F (36.8 C) 98.2 F (36.8 C)  SpO2: 97% 97%    Recent laboratory studies:  Lab Results  Component Value Date   HGB 12.9 (L) 04/21/2019   HGB 12.8 (L) 02/08/2019   HGB 13.1 01/24/2018   Lab Results  Component Value Date   WBC 9.8 02/08/2019   PLT 442 (H) 02/08/2019   Lab Results  Component Value Date   INR 1.04 01/14/2018   Lab Results  Component Value Date   NA 138 04/21/2019   K 3.9 04/21/2019   CL 104 04/21/2019   CO2 25 02/08/2019   BUN 16 04/21/2019   CREATININE 0.70 04/21/2019   GLUCOSE 152 (H) 04/21/2019    Discharge Medications:   Allergies as of 04/22/2019      Reactions   Oxycontin [oxycodone Hcl] Swelling, Other (See Comments)   Lips swells. Tolerates immediate-release oxycodone as well as hydrocodone.    Quinine Other (See Comments)   Platelets dropped   Voltaren [diclofenac Sodium] Other (See Comments)   Elevated liver enzymes   Doxycycline Other (See Comments)   Severe nose bleeds   Morphine And Related Nausea And Vomiting      Medication List    STOP taking these medications   aspirin EC 81 MG tablet   Macrobid 100 MG capsule Generic drug: nitrofurantoin (macrocrystal-monohydrate)     TAKE these medications   carvedilol 25 MG tablet Commonly known as: COREG Take 0.5 tablets (12.5 mg total) by mouth 2 (two) times daily with a meal. What changed:   how much to take  when to take this   ciprofloxacin 500 MG tablet Commonly known as: CIPRO Take 1 tablet (500 mg total) by mouth every 12 (twelve) hours. Notes to patient: Next dose is due at 9 pm   clobetasol ointment 0.05 % Commonly known as: TEMOVATE Apply 1 application topically daily as needed (for peeling skin on hands and feet). Reported on 03/17/2015   diphenhydrAMINE 50 MG tablet Commonly known as: BENADRYL Take 25 mg by mouth every 6 (six) hours as needed for itching. Takes 2  Of 25 mg tabs   HYDROmorphone 4 MG tablet Commonly known as: DILAUDID Take 8 mg by mouth 4 (four) times daily as needed for moderate pain. Notes to patient: Next dose is due at 0930 as needed for pain   insulin aspart 100 UNIT/ML injection Commonly known as: NovoLOG Inject 0-9 Units into the skin 3 (three) times daily before meals. What changed:   how much to take  additional instructions   insulin NPH Human 100 UNIT/ML injection Commonly known as: NOVOLIN N Inject 25-50 Units into the skin See admin instructions. Inject 50 units SQ in the morning, inject 40  units SQ at lunch and inject 40 units SQ at bedtime   lansoprazole 30 MG capsule Commonly known as: PREVACID Take 30 mg by mouth daily as needed (for acid reflux).   losartan 50 MG tablet Commonly known as: COZAAR Take 50 mg by mouth at bedtime.   magnesium oxide  400 MG tablet Commonly known as: MAG-OX Take 800 mg by mouth at bedtime.   Pravachol 20 MG tablet Generic drug: pravastatin Take 20 mg by mouth. q Monday and Friday at hs   VITAMIN C PO Take by mouth daily.       Diagnostic Studies: No results found.  Disposition: Discharge disposition: 01-Home or Self Care       Discharge Instructions    Discharge patient   Complete by:  As directed    Discharge disposition: 01-Home or Self Care   Discharge patient date: 04/22/2019      Follow-up Information    Franchot Gallo, MD.   Specialty: Urology Why: 4.12.2021 @ 2 pm Contact information: [address] [id number] [phone]            Signed: Nicolette Bang 04/24/2019, 1:26 PM

## 2019-04-30 ENCOUNTER — Other Ambulatory Visit: Payer: Self-pay

## 2019-04-30 DIAGNOSIS — E1065 Type 1 diabetes mellitus with hyperglycemia: Secondary | ICD-10-CM | POA: Diagnosis not present

## 2019-04-30 NOTE — Patient Outreach (Signed)
  [facility] Care Management Chronic Special Needs Program   04/30/2019  Name: Derek [name], DOB: 1945-06-08  MRN: [id number]  The client was discussed in today's interdisciplinary care team meeting.  The following issues were discussed:  Client's needs, Changes in health status, Coordination of care and Issues/barriers to care  Participants present:   Thea Silversmith, MSN, RN, CCM   Rollen Selders RN,BSN,CCM, CDE  Quinn Plowman RN, BSN, CCM  Maryella Shivers, MD  Karrie Meres, PharmD, RPh Bary Castilla, RN, BSN, MS, CCM [place] CBCS/CMAA  Roney Mans RD, LDN [facility] RN Teresa Pelton RN, BSN  Recommendations:  [facility] to follow client for complex management.  Continue to follow post TURP and encourage diabetes self management  Follow-up:  RNCM to follow as per tier level and will collaborate with [facility] team as needed  [place], Jackquline Denmark, [place] Management [phone]

## 2019-05-06 MED FILL — HYDROmorphone HCL 8 MG TABS: 8 | 30 days supply | Qty: 120 | Fill #0

## 2019-05-06 MED FILL — NovoLOG 100 UNIT/ML SOLN: 100 | 42 days supply | Qty: 20 | Fill #1

## 2019-05-06 MED FILL — PRAVASTATIN NA 20 MG TAB: 20 | 90 days supply | Qty: 26 | Fill #1

## 2019-05-11 ENCOUNTER — Encounter (HOSPITAL_BASED_OUTPATIENT_CLINIC_OR_DEPARTMENT_OTHER): Payer: Self-pay | Admitting: Emergency Medicine

## 2019-05-11 ENCOUNTER — Emergency Department (HOSPITAL_BASED_OUTPATIENT_CLINIC_OR_DEPARTMENT_OTHER)
Admission: EM | Admit: 2019-05-11 | Discharge: 2019-05-12 | Disposition: A | Discharge status: Home / Self Care | Payer: HMO | Attending: Emergency Medicine | Admitting: Emergency Medicine

## 2019-05-11 ENCOUNTER — Other Ambulatory Visit: Payer: Self-pay

## 2019-05-11 DIAGNOSIS — Z8507 Personal history of malignant neoplasm of pancreas: Secondary | ICD-10-CM | POA: Insufficient documentation

## 2019-05-11 DIAGNOSIS — Z96651 Presence of right artificial knee joint: Secondary | ICD-10-CM | POA: Insufficient documentation

## 2019-05-11 DIAGNOSIS — N3001 Acute cystitis with hematuria: Secondary | ICD-10-CM

## 2019-05-11 DIAGNOSIS — E119 Type 2 diabetes mellitus without complications: Secondary | ICD-10-CM | POA: Insufficient documentation

## 2019-05-11 DIAGNOSIS — Z79899 Other long term (current) drug therapy: Secondary | ICD-10-CM | POA: Diagnosis not present

## 2019-05-11 DIAGNOSIS — Z87891 Personal history of nicotine dependence: Secondary | ICD-10-CM | POA: Insufficient documentation

## 2019-05-11 DIAGNOSIS — R339 Retention of urine, unspecified: Secondary | ICD-10-CM

## 2019-05-11 DIAGNOSIS — Z96652 Presence of left artificial knee joint: Secondary | ICD-10-CM | POA: Diagnosis not present

## 2019-05-11 DIAGNOSIS — Z794 Long term (current) use of insulin: Secondary | ICD-10-CM | POA: Insufficient documentation

## 2019-05-11 DIAGNOSIS — R1030 Lower abdominal pain, unspecified: Secondary | ICD-10-CM | POA: Diagnosis not present

## 2019-05-11 DIAGNOSIS — I10 Essential (primary) hypertension: Secondary | ICD-10-CM | POA: Diagnosis not present

## 2019-05-11 LAB — CBC
HCT: 38.7 % — ABNORMAL LOW (ref 39.0–52.0)
Hemoglobin: 12.5 g/dL — ABNORMAL LOW (ref 13.0–17.0)
MCH: 29.5 pg (ref 26.0–34.0)
MCHC: 32.3 g/dL (ref 30.0–36.0)
MCV: 91.3 fL (ref 80.0–100.0)
Platelets: 354 10*3/uL (ref 150–400)
RBC: 4.24 MIL/uL (ref 4.22–5.81)
RDW: 13.9 % (ref 11.5–15.5)
WBC: 7.3 10*3/uL (ref 4.0–10.5)
nRBC: 0 % (ref 0.0–0.2)

## 2019-05-11 LAB — COMPREHENSIVE METABOLIC PANEL
ALT: 15 U/L (ref 0–44)
AST: 16 U/L (ref 15–41)
Albumin: 3.6 g/dL (ref 3.5–5.0)
Alkaline Phosphatase: 93 U/L (ref 38–126)
Anion gap: 7 (ref 5–15)
BUN: 23 mg/dL (ref 8–23)
CO2: 20 mmol/L — ABNORMAL LOW (ref 22–32)
Calcium: 10.1 mg/dL (ref 8.9–10.3)
Chloride: 104 mmol/L (ref 98–111)
Creatinine, Ser: 0.94 mg/dL (ref 0.61–1.24)
GFR calc Af Amer: >60 mL/min (ref >60)
GFR calc non Af Amer: >60 mL/min (ref >60)
Glucose, Bld: 233 mg/dL — ABNORMAL HIGH (ref 70–99)
Potassium: 5.1 mmol/L (ref 3.5–5.1)
Sodium: 131 mmol/L — ABNORMAL LOW (ref 135–145)
Total Bilirubin: 0.2 mg/dL — ABNORMAL LOW (ref 0.3–1.2)
Total Protein: 6.9 g/dL (ref 6.5–8.1)

## 2019-05-11 LAB — URINALYSIS, ROUTINE W REFLEX MICROSCOPIC
Bilirubin Urine: NEGATIVE
Glucose, UA: 250 mg/dL — AB
Ketones, ur: NEGATIVE mg/dL
Nitrite: NEGATIVE
Protein, ur: 30 mg/dL — AB
Specific Gravity, Urine: 1.025 (ref 1.005–1.030)
pH: 6 (ref 5.0–8.0)

## 2019-05-11 LAB — URINALYSIS, MICROSCOPIC (REFLEX)
RBC / HPF: >50 RBC/hpf (ref 0–5)
WBC, UA: >50 WBC/hpf (ref 0–5)

## 2019-05-11 MED ORDER — SODIUM CHLORIDE 0.9 % IV BOLUS
500.0000 mL | Freq: Once | INTRAVENOUS | Status: AC
  Administered 2019-05-11: 500 mL via INTRAVENOUS

## 2019-05-11 MED ORDER — HYDROMORPHONE HCL 1 MG/ML IJ SOLN
1.0000 mg | Freq: Once | INTRAMUSCULAR | Status: AC
  Administered 2019-05-11: 1 mg via INTRAVENOUS
  Filled 2019-05-11: qty 1

## 2019-05-11 MED ORDER — LIDOCAINE HCL URETHRAL/MUCOSAL 2 % EX GEL
CUTANEOUS | Status: AC
  Filled 2019-05-11: qty 20

## 2019-05-11 MED ORDER — SODIUM CHLORIDE 0.9 % IV SOLN
1.0000 g | Freq: Once | INTRAVENOUS | Status: AC
  Administered 2019-05-11: 1 g via INTRAVENOUS
  Filled 2019-05-11: qty 10

## 2019-05-11 MED ORDER — ONDANSETRON HCL 4 MG/2ML IJ SOLN
4.0000 mg | Freq: Once | INTRAMUSCULAR | Status: AC
  Administered 2019-05-11: 4 mg via INTRAVENOUS
  Filled 2019-05-11: qty 2

## 2019-05-11 NOTE — ED Provider Notes (Signed)
[facility] Provider Note   CSN: [id number] Arrival date & time: 05/11/19  2054     History Chief Complaint  Patient presents with  . Urinary Retention    Derek [name] is a 74 y.o. male.  Patient c/o trouble urinating since yesterday. Symptoms acute onset, moderate, persistent. Notes hx turp a couple months ago, prior to that was self cathing. In past day, had urge to urinate, but only able to go a small amount, mixed with blood/small clots, and felt unable to empty bladder. +suprapubic discomfort, dull, moderate. Pt tried to self cath but only got small amount out. No back/flank pain. No fever or chills. No vomiting. Is eating and drinking per normal. No vomiting or diarrhea.   The history is provided by the patient and the spouse.       Past Medical History:  Diagnosis Date  . Anxiety   . Arthritis   . Bilateral leg cramps    takes magnesium  . BPH (benign prostatic hyperplasia)   . DDD (degenerative disc disease), lumbar    gets back injections   . Deafness in right ear    meniere's disease  . Depression   . Dermatitis    bilateral hands  . 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 Factor V Leiden mutation ([place])    "never had blood clots"  . GERD (gastroesophageal reflux disease)   . Hiatal hernia   . History of kidney stones    multiple kidney stones-not a problem at present  . 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 History of panic attacks   . 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 Hypertension   . Incomplete right bundle branch block   . Insulin dependent type 2 diabetes mellitus, uncontrolled ([place]) followed by dr [name] ([place])   dx 2007--- ltype 1 now since jan 2017  surgery with part of pancreas removed  . Lower urinary tract symptoms (LUTS)   . Meniere's disease dx 1970s   intermittant vertigo  . Obstructive sleep apnea    " i used to have sleep apnea" never used a c-pap   . Open wound of abdomen    WET/DRY DRESSING CHANGES TID--- POST ABD. SURGERY 09-21-2016 healed now  . Peripheral vascular disease (HCC)    right leg - decreased pulse   . Wears partial dentures    LOWER  . 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 Enlarged prostate with urinary obstruction 04/21/2019  . 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 Foreign body of abdominal wall 09/04/2016  . Ventral incisional hernia 11/12/2015  . Incisional hernia 11/10/2015  . 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 Pleural effusion on left   . Lactose intolerance in adult 04/04/2015  . Protein-calorie malnutrition, moderate ([place]) 04/04/2015  . Pleural effusion, left   . Debility   . Benign essential HTN   . Leukocytosis   . Thrombocytosis ([place])   . Intra-abdominal abscess ([place])   . Poorly controlled type 2 diabetes mellitus ([place])   . 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  OA (osteoarthritis) of knee 06/02/2013  . Anal fissure 02/04/2013  . Meniere's disease   . GERD (gastroesophageal reflux disease)   . Kidney stone   . Obstructive sleep apnea   . Morbid obesity ([place])   . Synovitis of knee 03/01/2012  . Chronic cough 11/03/2011  . COSTOCHONDRITIS, LEFT 10/08/2009  . RIB PAIN, LEFT SIDED 10/08/2009    Past Surgical History:  Procedure Laterality Date  . ANAL FISTULECTOMY N/A 04/01/2013   Procedure: EXAM UNDER ANESTHESIA WITH ANAL FISSURectomy, sphincterotomy and internal hemorrhoidectomy;  Surgeon: Harl Bowie, MD;  Location: [facility];  Service: General;  Laterality: N/A;  . APPLICATION OF WOUND VAC N/A 05/16/2015   Procedure:  APPLICATION OF WOUND VAC;  Surgeon: Armandina Gemma, MD;  [facility];  Service: General;  Laterality: N/A;  . APPLICATION OF WOUND VAC N/A 05/20/2015   Procedure: EXHANGE OF ABDOMINAL  WOUND VAC DRESSING;  Surgeon: Armandina Gemma, MD;  [facility];  Service: General;  Laterality: N/A;  . COLONOSCOPY    . CYSTOSCOPY WITH INSERTION OF UROLIFT N/A 10/12/2016   Procedure: CYSTOSCOPY WITH INSERTION OF UROLIFT;  Surgeon: Franchot Gallo, MD;  Location: [facility];  Service: Urology;  Laterality: N/A;  . EUS N/A 12/31/2014   Procedure: UPPER ENDOSCOPIC ULTRASOUND (EUS) LINEAR;  Surgeon: Milus Banister, MD;  [facility];  Service: Endoscopy;  Laterality: N/A;  . HARDWARE REMOVAL Left 2013   left ankle  . HEMORRHOIDECTOMY WITH HEMORRHOID BANDING    . INCISION AND DRAINAGE ABSCESS N/A 05/14/2015   Procedure: DRAINAGE OF INFECTED PANCREATIC PSEUDOCYST;  Surgeon: Armandina Gemma, MD;  [facility];  Service: General;  Laterality: N/A;  . INCISION AND DRAINAGE OF WOUND N/A 05/16/2015   Procedure: IRRIGATION AND DEBRIDEMENT WOUND;  Surgeon: Armandina Gemma, MD;  [facility];  Service: General;  Laterality: N/A;  . INCISIONAL HERNIA REPAIR N/A 11/12/2015   Procedure: [place];  Surgeon: Armandina Gemma, MD;  Location: [facility];  Service: General;  Laterality: N/A;  . INGUINAL HERNIA REPAIR Right 1974;  1982  . INSERTION OF MESH N/A 11/12/2015   Procedure: INSERTION OF MESH;  Surgeon: Armandina Gemma, MD;  Location: [facility];  Service: General;  Laterality: N/A;  . IR GENERIC HISTORICAL  04/20/2015   IR RADIOLOGIST EVAL & MGMT 04/20/2015 [facility] INTERV RAD  . KNEE ARTHROSCOPY Bilateral right 08-08-2000;  left 11-23-2000   meniscal repair and chondroplasty's  . KNEE ARTHROSCOPY  03/01/2012   Procedure: ARTHROSCOPY KNEE;  Surgeon: Gearlean Alf, MD;  [facility];  Service: Orthopedics;  Laterality: Left;  WITH SYNOVECTOMY   . LAPAROTOMY N/A  05/14/2015   Procedure: EXPLORATORY LAPAROTOMY;  Surgeon: Armandina Gemma, MD;  [facility];  Service: General;  Laterality: N/A;  . ORIF LEFT ANKLE FX  08/20/2009   distal tib-fib and malleolus  . ROTATOR CUFF REPAIR Bilateral right 1994; left 1993, left 1993  . TOTAL KNEE ARTHROPLASTY Right 06/02/2013   Procedure: RIGHT TOTAL KNEE ARTHROPLASTY;  Surgeon: Gearlean Alf, MD;  [facility];  Service: Orthopedics;  Laterality: Right;  . TOTAL KNEE ARTHROPLASTY Left 01-31-2008   dr Wynelle Link  . TOTAL KNEE REVISION Right 01/23/2018   Procedure: right knee polyethylene revision;  Surgeon: Gaynelle Arabian, MD;  [facility];  Service: Orthopedics;  Laterality: Right;  69min  . TRANSURETHRAL RESECTION OF PROSTATE N/A 04/21/2019   Procedure: TRANSURETHRAL RESECTION OF THE PROSTATE (TURP);  Surgeon: Franchot Gallo,  MD;  Location: [facility];  Service: Urology;  Laterality: N/A;  . UMBILICAL HERNIA REPAIR  08-11-1999   dr Ninfa Linden  . UPPER GASTROINTESTINAL ENDOSCOPY  sept 2016  . WOUND EXPLORATION N/A 09/21/2016   Procedure: WOUND EXPLORATION AND DEBRIDEMENT ABDOMINAL WALL;  Surgeon: Armandina Gemma, MD;  [facility];  Service: General;  Laterality: N/A;       Family History  Problem Relation Age of Onset  . Bone cancer Father        Jaw  . Factor V Leiden deficiency Daughter   . Diabetes Maternal Grandmother   . Heart disease Paternal Uncle   . Colon cancer Neg Hx   . Esophageal cancer Neg Hx   . Stomach cancer Neg Hx   . Rectal cancer Neg Hx     Social History   Tobacco Use  . Smoking status: Former Smoker    Packs/day: 4.00    Years: 17.00    Pack years: 68.00    Types: Cigarettes    Quit date: 02/21/1975    Years since quitting: 44.2  . Smokeless tobacco: Never Used  Substance Use Topics  . Alcohol use: No    Alcohol/week: 0.0 standard drinks  . Drug use: No    Home Medications Prior to Admission medications   Medication Sig Start Date End Date  Taking? Authorizing Provider  Ascorbic Acid (VITAMIN C PO) Take by mouth daily.     [provider]  carvedilol (COREG) 25 MG tablet Take 0.5 tablets (12.5 mg total) by mouth 2 (two) times daily with a meal. Patient taking differently: Take 25 mg by mouth every evening.  04/06/15   Michael Boston, MD  ciprofloxacin (CIPRO) 500 MG tablet Take 1 tablet (500 mg total) by mouth every 12 (twelve) hours. 04/22/19   McKenzie, Candee Furbish, MD  clobetasol ointment (TEMOVATE) [id number] % Apply 1 application topically daily as needed (for peeling skin on hands and feet). Reported on 03/17/2015    [provider]  diphenhydrAMINE (BENADRYL) 50 MG tablet Take 25 mg by mouth every 6 (six) hours as needed for itching. Takes 2  Of 25 mg tabs    [provider]  HYDROmorphone (DILAUDID) 4 MG tablet Take 8 mg by mouth 4 (four) times daily as needed for moderate pain.     [provider]  insulin aspart (NOVOLOG) 100 UNIT/ML injection Inject 0-9 Units into the skin 3 (three) times daily before meals. Patient taking differently: Inject 5-30 Units into the skin 3 (three) times daily before meals. Per sliding scale 03/03/15   Armandina Gemma, MD  insulin NPH Human (HUMULIN N,NOVOLIN N) 100 UNIT/ML injection Inject 25-50 Units into the skin See admin instructions. Inject 50 units SQ in the morning, inject 40  units SQ at lunch and inject 40 units SQ at bedtime    [provider]  lansoprazole (PREVACID) 30 MG capsule Take 30 mg by mouth daily as needed (for acid reflux).     [provider]  losartan (COZAAR) 50 MG tablet Take 50 mg by mouth at bedtime.     [provider]  magnesium oxide (MAG-OX) 400 MG tablet Take 800 mg by mouth at bedtime.    [provider]  pravastatin (PRAVACHOL) 20 MG tablet Take 20 mg by mouth. q Monday and Friday at hs    [provider]    Allergies    Oxycontin [oxycodone hcl], Quinine, Voltaren [diclofenac sodium],  Doxycycline, and Morphine and related  Review of Systems   Review of Systems  Constitutional: Negative for fever.  HENT: Negative for sore throat.   Eyes: Negative for redness.  Respiratory: Negative for shortness of breath.   Cardiovascular: Negative for chest pain.  Gastrointestinal: Negative for diarrhea and vomiting.  Endocrine: Negative for polyuria.  Genitourinary: Positive for decreased urine volume and hematuria. Negative for flank pain.  Musculoskeletal: Negative for back pain and neck pain.  Skin: Negative for rash.  Neurological: Negative for weakness and numbness.  Hematological: Does not bruise/bleed easily.       No atnicoag use.   Psychiatric/Behavioral: Negative for confusion.    Physical Exam Updated Vital Signs BP (!) 142/69 (BP Location: Right Arm)   Pulse 85   Temp 98.3 F (36.8 C) (Oral)   Resp 20   Ht 1.829 m (6')   Wt 117.9 kg   SpO2 96%   BMI 35.26 kg/m   Physical Exam Vitals and nursing note reviewed.  Constitutional:      Appearance: Normal appearance. He is well-developed.  HENT:     Head: Atraumatic.     Nose: Nose normal.     Mouth/Throat:     Mouth: Mucous membranes are moist.     Pharynx: Oropharynx is clear.  Eyes:     General: No scleral icterus.    Conjunctiva/sclera: Conjunctivae normal.  Neck:     Trachea: No tracheal deviation.  Cardiovascular:     Rate and Rhythm: Normal rate and regular rhythm.     Pulses: Normal pulses.     Heart sounds: Normal heart sounds. No murmur. No friction rub. No gallop.   Pulmonary:     Effort: Pulmonary effort is normal. No accessory muscle usage or respiratory distress.     Breath sounds: Normal breath sounds.  Abdominal:     General: Bowel sounds are normal. There is no distension.     Palpations: Abdomen is soft. There is no mass.     Tenderness: There is no abdominal tenderness. There is no guarding or rebound.     Hernia: No hernia is present.  Genitourinary:    Comments: No cva  tenderness. Normal external gu exam.  Musculoskeletal:        General: No swelling.     Cervical back: Normal range of motion and neck supple. No rigidity.  Skin:    General: Skin is warm and dry.     Findings: No rash.  Neurological:     Mental Status: He is alert.     Comments: Alert, speech clear.   Psychiatric:        Mood and Affect: Mood normal.     ED Results / Procedures / Treatments   Labs (all labs ordered are listed, but only abnormal results are displayed) Results for orders placed or performed during the hospital encounter of 05/11/19  CBC  Result Value Ref Range   WBC 7.3 4.0 - 10.5 K/uL   RBC 4.24 4.22 - 5.81 MIL/uL   Hemoglobin 12.5 (L) 13.0 - 17.0 g/dL   HCT 38.7 (L) 39.0 - 52.0 %   MCV 91.3 80.0 - 100.0 fL   MCH 29.5 26.0 - 34.0 pg   MCHC 32.3 30.0 - 36.0 g/dL   RDW 13.9 11.5 - 15.5 %   Platelets 354 150 - 400 K/uL   nRBC 0.0 0.0 - 0.2 %    EKG None  Radiology No results found.  Procedures Procedures (including critical care time)  Medications Ordered in ED Medications  lidocaine (XYLOCAINE) 2 % jelly (has no administration in time range)    ED Course  I have reviewed the triage vital signs and the nursing notes.  Pertinent labs & imaging results that were available during my care of the patient were reviewed by me and considered in my medical decision making (see chart for details).    MDM Rules/Calculators/A&P                     Labs sent. Iv ns.   Reviewed nursing notes and prior charts for additional history.   Initial labs reviewed/interpreted by me - wbc normal.  Foley cath. ~ 300 cc mildly cloudy urine in bag/tube. UA sent.   2315, chemistries including renal fxn and UA remain pending - signed out to Dr Dina Rich.       Final Clinical Impression(s) / ED Diagnoses Final diagnoses:  None    Rx / DC Orders ED Discharge Orders    None       Lajean Saver, MD 05/11/19 2314

## 2019-05-11 NOTE — ED Notes (Signed)
Four different scans at various points on the abdomen, all scans show 0 ml's in bladder.

## 2019-05-11 NOTE — ED Notes (Signed)
Attempted IV access via ultrasound X 1 without success. Patient tolerated well.

## 2019-05-11 NOTE — ED Triage Notes (Signed)
Pt reports urinary rentention since last night. Pt self caths, unable to get urine return, reports "hematuria and clots".

## 2019-05-12 MED ORDER — CEPHALEXIN 500 MG PO CAPS: 500.0000 mg | ORAL_CAPSULE | Freq: Three times a day (TID) | ORAL | 0 refills | Status: DC

## 2019-05-12 MED FILL — CEPHALEXIN 500 MG CAPSULE: 500 | 7 days supply | Qty: 21 | Fill #0

## 2019-05-12 NOTE — ED Provider Notes (Signed)
Patient signed out pending lab results and urinalysis.  In brief he is a 74 year old gentleman with recent TURP procedure on March 01.  He been doing well at home but recently had to begin self cathing again because of urinary retention.  Had progressive retention and inability to fully evacuate his bladder.  Foley catheter was placed in the ED with greater than 500 cc output.  This was left in place.  Urinalysis with greater than 50 white cells and many bacteria.  He also has right blood cell clumps.  He is greater than 2 weeks out from his procedure.  We will send urine culture.  He was given a dose of Rocephin.  He is nontoxic-appearing and does not appear systemically infected.  Will place on antibiotics and have him follow-up closely with his urologist for catheter removal and repeat evaluation.  Patient and his daughter were updated at the bedside.  After history, exam, and medical workup I feel the patient has been appropriately medically screened and is safe for discharge home. Pertinent diagnoses were discussed with the patient. Patient was given return precautions.  Problem List Items Addressed This Visit    None    Visit Diagnoses    Urinary retention    -  Primary   Acute cystitis with hematuria            Dina Rich Barbette Hair, MD 05/12/19 [phone]

## 2019-05-12 NOTE — ED Notes (Signed)
Applied leg bag. Supplies and instructions  Given to pt and wife. PT has been self catherizing at home and is familiar with procedure for attaching leg bag and bedside drainage bag.

## 2019-05-12 NOTE — Discharge Instructions (Addendum)
You were seen today for urinary retention.  Keep Foley in place for the next 4 to 5 days and follow-up closely with your urologist for removal.  Your urinalysis does appear infected.  Take antibiotics as prescribed.  If you develop fevers or back pain, this would be reason to be reevaluated.

## 2019-05-13 LAB — URINE CULTURE: Culture: NO GROWTH

## 2019-05-16 DIAGNOSIS — R8279 Other abnormal findings on microbiological examination of urine: Secondary | ICD-10-CM | POA: Diagnosis not present

## 2019-05-30 DIAGNOSIS — E1065 Type 1 diabetes mellitus with hyperglycemia: Secondary | ICD-10-CM | POA: Diagnosis not present

## 2019-06-02 DIAGNOSIS — R3914 Feeling of incomplete bladder emptying: Secondary | ICD-10-CM | POA: Diagnosis not present

## 2019-06-02 DIAGNOSIS — N401 Enlarged prostate with lower urinary tract symptoms: Secondary | ICD-10-CM | POA: Diagnosis not present

## 2019-06-02 MED FILL — CLOTRIMAZOLE-BETAMETHASONE: 1-0.05 | 10 days supply | Qty: 15 | Fill #0

## 2019-06-02 MED FILL — NITROFURANTOIN MONO-MCR 100: 100 | 7 days supply | Qty: 14 | Fill #0

## 2019-06-13 DIAGNOSIS — G894 Chronic pain syndrome: Secondary | ICD-10-CM | POA: Diagnosis not present

## 2019-06-13 DIAGNOSIS — M545 Low back pain: Secondary | ICD-10-CM | POA: Diagnosis not present

## 2019-06-19 MED FILL — HYDROmorphone HCL 8 MG TABS: 8 | 30 days supply | Qty: 120 | Fill #0

## 2019-06-25 DIAGNOSIS — I10 Essential (primary) hypertension: Secondary | ICD-10-CM | POA: Diagnosis not present

## 2019-06-25 DIAGNOSIS — E1169 Type 2 diabetes mellitus with other specified complication: Secondary | ICD-10-CM | POA: Diagnosis not present

## 2019-06-25 DIAGNOSIS — E7800 Pure hypercholesterolemia, unspecified: Secondary | ICD-10-CM | POA: Diagnosis not present

## 2019-06-30 DIAGNOSIS — E1065 Type 1 diabetes mellitus with hyperglycemia: Secondary | ICD-10-CM | POA: Diagnosis not present

## 2019-07-01 ENCOUNTER — Other Ambulatory Visit (HOSPITAL_BASED_OUTPATIENT_CLINIC_OR_DEPARTMENT_OTHER): Payer: Self-pay | Admitting: Family Medicine

## 2019-07-01 DIAGNOSIS — R21 Rash and other nonspecific skin eruption: Secondary | ICD-10-CM | POA: Diagnosis not present

## 2019-07-01 DIAGNOSIS — Z8507 Personal history of malignant neoplasm of pancreas: Secondary | ICD-10-CM | POA: Diagnosis not present

## 2019-07-01 DIAGNOSIS — F339 Major depressive disorder, recurrent, unspecified: Secondary | ICD-10-CM | POA: Diagnosis not present

## 2019-07-01 DIAGNOSIS — L570 Actinic keratosis: Secondary | ICD-10-CM | POA: Diagnosis not present

## 2019-07-01 DIAGNOSIS — E7800 Pure hypercholesterolemia, unspecified: Secondary | ICD-10-CM | POA: Diagnosis not present

## 2019-07-01 DIAGNOSIS — I10 Essential (primary) hypertension: Secondary | ICD-10-CM | POA: Diagnosis not present

## 2019-07-01 DIAGNOSIS — Z0000 Encounter for general adult medical examination without abnormal findings: Secondary | ICD-10-CM | POA: Diagnosis not present

## 2019-07-04 DIAGNOSIS — M5416 Radiculopathy, lumbar region: Secondary | ICD-10-CM | POA: Diagnosis not present

## 2019-07-04 DIAGNOSIS — G629 Polyneuropathy, unspecified: Secondary | ICD-10-CM | POA: Diagnosis not present

## 2019-07-04 DIAGNOSIS — G894 Chronic pain syndrome: Secondary | ICD-10-CM | POA: Diagnosis not present

## 2019-07-07 MED FILL — ULTCARE INS SYR 1 ML 31GX5/: 31G X 5/16" | 30 days supply | Qty: 200 | Fill #3

## 2019-07-10 ENCOUNTER — Other Ambulatory Visit: Payer: Self-pay

## 2019-07-10 NOTE — Patient Outreach (Signed)
  [facility]) Care Management Chronic Special Needs Program  07/10/2019  Name: Derek [name] DOB: 07-Jul-1945  MRN: [id number]  Mr. Renato Pennick is enrolled in a chronic special needs plan for Diabetes.  Wife Claudia Desanctis party release answered  and states that they are at the dentist and requests RNCM call back next week.  HIPAA verified Plan for 2nd outreach call in 2-4 business days Chronic care management coordinator will attempt outreach in 2-4 business days.   Peter Garter RN, Jackquline [place], CDE Chronic Care Management Coordinator [facility] [phone]

## 2019-07-14 ENCOUNTER — Other Ambulatory Visit: Payer: Self-pay

## 2019-07-14 DIAGNOSIS — M546 Pain in thoracic spine: Secondary | ICD-10-CM | POA: Diagnosis not present

## 2019-07-14 NOTE — Patient Outreach (Signed)
[facility]) Care Management Chronic Special Needs Program  07/14/2019  Name: Derek [name] DOB: September 20, 1945  MRN: [id number]  Mr. Derek [name] is enrolled in a chronic special needs plan for Diabetes. Reviewed and updated care plan. Client gives permission to speak with wife Derek [name] HIPAA verified  Subjective: States that client had his pain stimulator inserted 1 1/2 weeks ago.  States that he has been followed up by his pain doctor and the company representatives to get the stimulator at the right settings for him.  Client states he has no pain now and he has only had to take a pain pill a few days ago when he stood too long.  States he rode his motorcycle for 200 miles yesterday.  Wife states that client is sleeping much better and his mood has been good.  States that since he had the TURP in March he has not needed to do self catheterization  and he has not had any UTI's.  States he has not seen Dr.Balan since February and he needs to schedule an appointment.  States that his Hemoglobin A1C had gone up when Dr.Ross checked it at his physical earlier this month.  States his blood sugars have been up and down.  States he can range from 200-400 and has had a few low readings around 50. States he gets low if he does not eat enough and goes out to work in the yard/workshop. Wife states that he has been trying to eat fewer CHO but he does not like many vegetables. Denies any falls. States his B/P is good.  Denies any issues with his welders lung  Goals Addressed            This Visit's Progress   . Client will report improved coping  with chronic back pain by next 6 months (pt-stated)   On track    Reports pain improved with implanted pain stimulator  Plan to continue to be followed by pain control/orthopedic doctor and call as needed    . Client will report improved coping with diabetes by next 6 months(continue 04/18/19)   On track    Instructed to call and schedule  follow up appointment with Dr.Balan(endocrinologist) Encouraged to focus on getting blood sugars in range since pain is improved Reinforced to call endocrinologist for elevated blood sugars  Encouraged to increase activity as tolerated     . Client will report no fall or injuries in the next 6 months.(continue 04/18/19   On track    No reports of falls  Educated on fall precautions and home safety Stand up slowly Use cane or walker at all times Use grabber to pick up objects on the floor Keep home well-lit and wear your glasses     . Client will verbalize knowledge of chronic lung disease as evidenced by no ED visits or Inpatient stays related to chronic lung disease    On track    Reports no issues with lungs/chronic bronchitis No ED or hospital visits for lung issues    . Client will verbalize knowledge of self management of Hypertension as evidences by BP reading of 140/90 or less; or as defined by provider   On track    Reports B/P less than 140/90 when checked Plan to check B/P regularly Take B/P medications as ordered Plan to follow a low salt diet  Increase activity as tolerated    . COMPLETED: Client/Caregiver will verbalize understanding of instructions related to self-care and safety  On track    Caregiver coping with caregiver stress at this time Reinforced to caregiver that resources are available for caregiver stress if needed Goal completed 07/14/19    . HEMOGLOBIN A1C < 7.0        Hemoglobin A1C 10.6% on 06/25/19 Reinforced fasting blood sugar goals of 80-130 and less than 180 1 1/2-2 hours after meals Reviewed signs and symptoms of hypoglycemia and actions to take Encouraged to discuss with Endocrinologist about getting Freestyle Libre 2 with alarms to help him avoid high and low blood sugars Reviewed diabetes action plan in [employer] calendar Reinforced carbohydrate counting and carbohydrate portion sizes     . Maintain timely refills of diabetic  medication as prescribed within the year .   On track    Maintaining timely refills of medications per dispense report Reinforced importance of getting medications refilled on time Goal completed 07/14/19    . COMPLETED: Obtain annual  Lipid Profile, LDL-C   On track    Last completed 06/25/19 LDL 70 The goal for LDL is less than 70 mg/dL as you are at high risk for complications Try to avoid saturated fats, trans-fats and eat more fiber Plan to take statin as ordered Goal completed 07/14/19    . COMPLETED: Obtain Annual Eye (retinal)  Exam    On track    Reports completed 07/12/19 Plan to have a dilated eye exam every year Goal completed 07/14/19    . Obtain Annual Foot Exam   On track    Check your skin and feet every day for cuts, bruises, redness, blisters, or sores. Schedule a foot exam with your health care provider once every year    . Obtain annual screen for micro albuminuria (urine) , nephropathy (kidney problems)   On track    Last completed 06/25/19 It is important for your doctor to check your urine for protein at least every year Goal completed 07/14/19    . Obtain Hemoglobin A1C at least 2 times per year   On track    Last completed 06/25/19 It is important to have your Hemoglobin A1C checked every 6 months if you are at goal and every 3 months if you are not at goal    . Patient Stated to have fewer UTI (pt-stated)   On track    Reports no UTI's since TURP on 04/21/19 Drink adequate amounts of fluids    . Visit Primary Care Provider or Endocrinologist at least 2 times per year    On track    Primary care visit 03/13/19, 07/01/19 Endocrinology 04/15/19 Annual wellness visit 07/01/19 Plan to keep scheduled appointments with providers     Client is not meeting diabetes self management goal of hemoglobin A1C of <7% with last reading of 10.6% Client is actively engaged with [facility] Reviewed [facility] electronic medical record and their plan of care.   Last provider visit was 05/07/19  Plan to review [facility]'s plan of care and will collaborate with [facility] team as needed  Plan:  Send successful outreach letter with a copy of their individualized care plan and Send individual care plan to provider  Chronic care management coordinator will outreach in:  3 Months per tier level     Peter Garter RN, Jackquline [place], [place] Management Coordinator [facility] [phone]

## 2019-07-15 ENCOUNTER — Ambulatory Visit: Payer: Self-pay

## 2019-07-18 MED FILL — LANSOPRAZOLE 30 MG CPDR: 30 | 90 days supply | Qty: 90 | Fill #0

## 2019-07-18 MED FILL — LOSARTAN POTASSIUM 50 MG TA: 50 | 90 days supply | Qty: 90 | Fill #0

## 2019-07-25 DIAGNOSIS — M545 Low back pain: Secondary | ICD-10-CM | POA: Diagnosis not present

## 2019-07-29 DIAGNOSIS — M545 Low back pain: Secondary | ICD-10-CM | POA: Diagnosis not present

## 2019-07-31 DIAGNOSIS — E1065 Type 1 diabetes mellitus with hyperglycemia: Secondary | ICD-10-CM | POA: Diagnosis not present

## 2019-08-01 ENCOUNTER — Ambulatory Visit: Payer: Self-pay | Admitting: Orthopedic Surgery

## 2019-08-05 MED FILL — SERTRALINE HCL 25 MG TABLET: 25 | 30 days supply | Qty: 30 | Fill #1

## 2019-08-05 MED FILL — CLINDAMYCIN HCL 150 MG CAPS: 150 | 8 days supply | Qty: 22 | Fill #0

## 2019-08-12 DIAGNOSIS — F339 Major depressive disorder, recurrent, unspecified: Secondary | ICD-10-CM | POA: Diagnosis not present

## 2019-08-12 DIAGNOSIS — E119 Type 2 diabetes mellitus without complications: Secondary | ICD-10-CM | POA: Diagnosis not present

## 2019-08-12 DIAGNOSIS — E7800 Pure hypercholesterolemia, unspecified: Secondary | ICD-10-CM | POA: Diagnosis not present

## 2019-08-12 DIAGNOSIS — I10 Essential (primary) hypertension: Secondary | ICD-10-CM | POA: Diagnosis not present

## 2019-08-12 DIAGNOSIS — E1165 Type 2 diabetes mellitus with hyperglycemia: Secondary | ICD-10-CM | POA: Diagnosis not present

## 2019-08-12 DIAGNOSIS — E1169 Type 2 diabetes mellitus with other specified complication: Secondary | ICD-10-CM | POA: Diagnosis not present

## 2019-08-12 DIAGNOSIS — M189 Osteoarthritis of first carpometacarpal joint, unspecified: Secondary | ICD-10-CM | POA: Diagnosis not present

## 2019-08-15 MED FILL — PRAVASTATIN NA 20 MG TAB: 20 | 90 days supply | Qty: 26 | Fill #0

## 2019-08-18 ENCOUNTER — Other Ambulatory Visit (HOSPITAL_COMMUNITY): Payer: HMO

## 2019-08-18 MED FILL — AMOX-CLAV 875-125 MG TABLET: 875-125 | 20 days supply | Qty: 20 | Fill #0

## 2019-08-20 ENCOUNTER — Ambulatory Visit: Admit: 2019-08-20 | Payer: HMO | Admitting: Orthopedic Surgery

## 2019-08-20 SURGERY — INSERTION, SPINAL CORD STIMULATOR, LUMBAR
Anesthesia: General

## 2019-08-25 DIAGNOSIS — M189 Osteoarthritis of first carpometacarpal joint, unspecified: Secondary | ICD-10-CM | POA: Diagnosis not present

## 2019-08-25 DIAGNOSIS — E119 Type 2 diabetes mellitus without complications: Secondary | ICD-10-CM | POA: Diagnosis not present

## 2019-08-25 DIAGNOSIS — E1169 Type 2 diabetes mellitus with other specified complication: Secondary | ICD-10-CM | POA: Diagnosis not present

## 2019-08-25 DIAGNOSIS — I10 Essential (primary) hypertension: Secondary | ICD-10-CM | POA: Diagnosis not present

## 2019-08-25 DIAGNOSIS — E1165 Type 2 diabetes mellitus with hyperglycemia: Secondary | ICD-10-CM | POA: Diagnosis not present

## 2019-08-25 DIAGNOSIS — E7800 Pure hypercholesterolemia, unspecified: Secondary | ICD-10-CM | POA: Diagnosis not present

## 2019-08-25 DIAGNOSIS — F339 Major depressive disorder, recurrent, unspecified: Secondary | ICD-10-CM | POA: Diagnosis not present

## 2019-08-27 MED FILL — NovoLOG 100 UNIT/ML SOLN: 100 | 42 days supply | Qty: 20 | Fill #3

## 2019-09-01 DIAGNOSIS — E1065 Type 1 diabetes mellitus with hyperglycemia: Secondary | ICD-10-CM | POA: Diagnosis not present

## 2019-09-01 MED FILL — HYDROmorphone HCL 8 MG TABS: 8 | 30 days supply | Qty: 120 | Fill #0

## 2019-09-02 MED FILL — CLINDAMYCIN HCL 150 MG CAPS: 150 | 7 days supply | Qty: 22 | Fill #0

## 2019-09-03 DIAGNOSIS — N138 Other obstructive and reflux uropathy: Secondary | ICD-10-CM | POA: Diagnosis not present

## 2019-09-03 DIAGNOSIS — N401 Enlarged prostate with lower urinary tract symptoms: Secondary | ICD-10-CM | POA: Diagnosis not present

## 2019-09-03 DIAGNOSIS — R350 Frequency of micturition: Secondary | ICD-10-CM | POA: Diagnosis not present

## 2019-09-08 DIAGNOSIS — I10 Essential (primary) hypertension: Secondary | ICD-10-CM | POA: Diagnosis not present

## 2019-09-08 DIAGNOSIS — R809 Proteinuria, unspecified: Secondary | ICD-10-CM | POA: Diagnosis not present

## 2019-09-08 DIAGNOSIS — Z794 Long term (current) use of insulin: Secondary | ICD-10-CM | POA: Diagnosis not present

## 2019-09-08 DIAGNOSIS — E109 Type 1 diabetes mellitus without complications: Secondary | ICD-10-CM | POA: Diagnosis not present

## 2019-09-08 MED FILL — TRESIBA FLEXTOUCH 100 UNITS: 100 | 30 days supply | Qty: 9 | Fill #0

## 2019-09-09 MED FILL — DOXYCYCLINE HYCLATE 100 MG: 100 | 10 days supply | Qty: 20 | Fill #0

## 2019-09-09 MED FILL — TECHLITE PEN NDL 32GX1/4: 32G X 6 MM | 90 days supply | Qty: 100 | Fill #0

## 2019-09-15 MED FILL — SERTRALINE HCL 25 MG TABLET: 25 | 30 days supply | Qty: 30 | Fill #0

## 2019-09-30 MED FILL — HYDROmorphone HCL 8 MG TABS: 8 | 30 days supply | Qty: 120 | Fill #0

## 2019-10-01 ENCOUNTER — Other Ambulatory Visit: Payer: Self-pay

## 2019-10-01 NOTE — Patient Outreach (Signed)
[facility]) Care Management Chronic Special Needs Program  10/01/2019  Name: Derek [name] DOB: [birth year]  MRN: [id number]  Mr. Henley Boettner is enrolled in a chronic special needs plan for Diabetes. Reviewed and updated care plan. Client gives permission to speak with wife Victor Langenbach HIPAA verified  Subjective: States that client's permanent pain stimulator surgery has been postponed because of his high Hemoglobin A1C.  States that he is now having more pain that his pain medication is not helping with as much.  States he is going back to see his pain doctor next week to discuss his pain medications.  States that the temporary stimulator really did help lower his pain.  States he saw Dr.Balan 3 weeks ago and they are working with her to get his Hemoglobin A1C lower.  States he is now on [place] insulin and he is on meal time insulin also.  States his blood sugars have been lower since he started the [place].  States his blood sugars can range from 150-300 in the morning and from 82-180 later in the day.  States he is having fewer low readings which he usually had at night since changing his insulin.  States he is to go back to see Dr. [name] next week.  States he has been having dental issues in which he is going to get dental implants.  States he has only been abel to eat soft foods which include potatoes, pudding and fruit cups.  States his urinary problems are a lot better and he no longer needs to do the self catheterizations since his bladder surgery.  Denies any falls.  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  States his B/P has been good.  Denies any lung or breathing problems at this time.  States the [facility] doctor saw him last month and will visit again in Sept or October.  Goals Addressed              This Visit's Progress   .  Client will report improved coping  with chronic back pain by next 6 months(continue 10/01/19  (pt-stated)   Worsening     Reports pain worsened since temporary  pain stimulator removed Plan to continue to be followed by pain control/orthopedic doctor and call as needed Plan to try non medication methods to reduce pain such as meditation, heat or cold packs and massage    .  Client will report improved coping with dental issues  by next 6 months        Reviewed soft foods that are lower in carbohydrate and with more protein Discussed drining protein drinks such as Premier which are low in carbohydrate and sugar Sent EMMI: Soft diet    .  Client will report improved coping with diabetes by next 6 months(continue 10/01/19)   No change     Reinforced to keep scheduled follow up appointment with Dr.Balan(endocrinologist) Encouraged to focus on getting blood sugars in range  Reinforced to call endocrinologist for elevated blood sugars  Encouraged to increase activity as tolerated     .  Client will report no fall or injuries in the next 6 months.(continue 10/01/19   On track     No reports of falls  Reinforced fall precautions and home safety Stand up slowly Use cane or walker at all times if needed Use grabber to pick up objects on the floor Keep home well-lit and wear your glasses     .  Client will verbalize knowledge of chronic lung disease as evidenced by no ED visits or Inpatient stays related to chronic lung disease    On track     Reports no issues with lungs/chronic bronchitis No ED or hospital visits for lung issues    .  Client will verbalize knowledge of self management of Hypertension as evidences by BP reading of 140/90 or less; or as defined by provider   On track     Reports B/P less than 140/90 when checked Plan to check B/P regularly Take B/P medications as ordered Plan to follow a low salt diet  Increase activity as tolerated    .  HEMOGLOBIN A1C < 7         Hemoglobin A1C 10.1% on 08/11/19 [facility] visit Reinforced fasting blood sugar goals of 80-130 and less  than 180 1 1/2-2 hours after meals Reinforced signs and symptoms of hypoglycemia and actions to take Encouraged to discuss with Endocrinologist insulin pump options Reinforced carbohydrate counting and carbohydrate portion sizes Encouraged to eat soft foods with protein and to avoid concentrated sweets     .  COMPLETED: Maintain timely refills of diabetic medication as prescribed within the year .   On track     Maintaining timely refills of medications per dispense report Reinforced importance of getting medications refilled on time Goal completed 07/14/19    .  Obtain Annual Foot Exam   On track     Check your skin and feet every day for cuts, bruises, redness, blisters, or sores. Schedule a foot exam with your health care provider once every year    .  COMPLETED: Obtain annual screen for micro albuminuria (urine) , nephropathy (kidney problems)        Last completed 06/25/19 It is important for your doctor to check your urine for protein at least every year Goal completed 07/14/19    .  Obtain Hemoglobin A1C at least 2 times per year   On track     Last completed 06/25/19 It is important to have your Hemoglobin A1C checked every 6 months if you are at goal and every 3 months if you are not at goal    .  COMPLETED: Patient Stated to have fewer UTI (pt-stated)   On track     Reports no UTI's since TURP on 04/21/19 Drink adequate amounts of fluids Goal completed 10/01/19    .  Visit Primary Care Provider or Endocrinologist at least 2 times per year    On track     Primary care visit 03/13/19, 07/01/19 Endocrinology 04/15/19, 09/08/19 [facility] provider 08/11/19 Annual wellness visit 07/01/19 Plan to keep scheduled appointments with providers     Reviewed [facility] electronic medical record and their plan of care.  Last provider visit was 08/11/19 Plan to review [facility]'s plan of care and will collaborate with [facility] team as needed  Plan:  Send successful outreach letter with a copy  of their individualized care plan, Send individual care plan to provider and Send educational material EMMI: Soft Diet  Chronic care management coordinator will outreach in:  3 Months     Peter Garter RN, [employer], [place] Management Coordinator [place] [facility] [phone]

## 2019-10-03 ENCOUNTER — Ambulatory Visit: Payer: Self-pay

## 2019-10-03 DIAGNOSIS — E1065 Type 1 diabetes mellitus with hyperglycemia: Secondary | ICD-10-CM | POA: Diagnosis not present

## 2019-10-06 DIAGNOSIS — Z794 Long term (current) use of insulin: Secondary | ICD-10-CM | POA: Diagnosis not present

## 2019-10-06 DIAGNOSIS — R809 Proteinuria, unspecified: Secondary | ICD-10-CM | POA: Diagnosis not present

## 2019-10-06 DIAGNOSIS — E109 Type 1 diabetes mellitus without complications: Secondary | ICD-10-CM | POA: Diagnosis not present

## 2019-10-06 DIAGNOSIS — I10 Essential (primary) hypertension: Secondary | ICD-10-CM | POA: Diagnosis not present

## 2019-10-06 MED FILL — TRESIBA FLEXTOUCH 100 UNITS: 100 | 33 days supply | Qty: 15 | Fill #0

## 2019-10-07 DIAGNOSIS — R109 Unspecified abdominal pain: Secondary | ICD-10-CM | POA: Diagnosis not present

## 2019-10-07 DIAGNOSIS — E1342 Other specified diabetes mellitus with diabetic polyneuropathy: Secondary | ICD-10-CM | POA: Diagnosis not present

## 2019-10-07 DIAGNOSIS — Z79899 Other long term (current) drug therapy: Secondary | ICD-10-CM | POA: Diagnosis not present

## 2019-10-07 DIAGNOSIS — M545 Low back pain: Secondary | ICD-10-CM | POA: Diagnosis not present

## 2019-10-07 DIAGNOSIS — M5416 Radiculopathy, lumbar region: Secondary | ICD-10-CM | POA: Diagnosis not present

## 2019-10-07 MED FILL — GABAPENTIN 300 MG CAPSULE: 300 | 30 days supply | Qty: 90 | Fill #0

## 2019-10-13 MED FILL — SERTRALINE HCL 25 MG TABLET: 25 | 30 days supply | Qty: 30 | Fill #1

## 2019-10-13 MED FILL — CARVEDILOL 25 MG TABLET: 25 | 90 days supply | Qty: 90 | Fill #1

## 2019-10-16 DIAGNOSIS — E109 Type 1 diabetes mellitus without complications: Secondary | ICD-10-CM | POA: Diagnosis not present

## 2019-10-16 DIAGNOSIS — H524 Presbyopia: Secondary | ICD-10-CM | POA: Diagnosis not present

## 2019-10-16 DIAGNOSIS — H0100A Unspecified blepharitis right eye, upper and lower eyelids: Secondary | ICD-10-CM | POA: Diagnosis not present

## 2019-10-16 DIAGNOSIS — H25813 Combined forms of age-related cataract, bilateral: Secondary | ICD-10-CM | POA: Diagnosis not present

## 2019-10-16 DIAGNOSIS — H52223 Regular astigmatism, bilateral: Secondary | ICD-10-CM | POA: Diagnosis not present

## 2019-10-16 DIAGNOSIS — H04123 Dry eye syndrome of bilateral lacrimal glands: Secondary | ICD-10-CM | POA: Diagnosis not present

## 2019-10-24 ENCOUNTER — Other Ambulatory Visit (HOSPITAL_BASED_OUTPATIENT_CLINIC_OR_DEPARTMENT_OTHER): Payer: Self-pay | Admitting: Family Medicine

## 2019-10-24 MED FILL — LOSARTAN POTASSIUM 50 MG TA: 50 | 90 days supply | Qty: 90 | Fill #1

## 2019-10-24 MED FILL — LANSOPRAZOLE 30 MG CPDR: 30 | 90 days supply | Qty: 90 | Fill #0

## 2019-10-28 MED FILL — HYDROmorphone HCL 8 MG TABS: 8 | 30 days supply | Qty: 120 | Fill #0

## 2019-11-03 DIAGNOSIS — K130 Diseases of lips: Secondary | ICD-10-CM | POA: Diagnosis not present

## 2019-11-04 DIAGNOSIS — E1065 Type 1 diabetes mellitus with hyperglycemia: Secondary | ICD-10-CM | POA: Diagnosis not present

## 2019-11-13 DIAGNOSIS — Z23 Encounter for immunization: Secondary | ICD-10-CM | POA: Diagnosis not present

## 2019-11-13 DIAGNOSIS — E109 Type 1 diabetes mellitus without complications: Secondary | ICD-10-CM | POA: Diagnosis not present

## 2019-11-13 MED FILL — NovoLOG 100 UNIT/ML SOLN: 100 | 42 days supply | Qty: 20 | Fill #4

## 2019-11-20 DIAGNOSIS — I10 Essential (primary) hypertension: Secondary | ICD-10-CM | POA: Diagnosis not present

## 2019-11-20 DIAGNOSIS — E1169 Type 2 diabetes mellitus with other specified complication: Secondary | ICD-10-CM | POA: Diagnosis not present

## 2019-11-20 DIAGNOSIS — E119 Type 2 diabetes mellitus without complications: Secondary | ICD-10-CM | POA: Diagnosis not present

## 2019-11-20 DIAGNOSIS — M189 Osteoarthritis of first carpometacarpal joint, unspecified: Secondary | ICD-10-CM | POA: Diagnosis not present

## 2019-11-20 DIAGNOSIS — F339 Major depressive disorder, recurrent, unspecified: Secondary | ICD-10-CM | POA: Diagnosis not present

## 2019-11-20 DIAGNOSIS — E1165 Type 2 diabetes mellitus with hyperglycemia: Secondary | ICD-10-CM | POA: Diagnosis not present

## 2019-11-20 DIAGNOSIS — E7800 Pure hypercholesterolemia, unspecified: Secondary | ICD-10-CM | POA: Diagnosis not present

## 2019-11-26 ENCOUNTER — Other Ambulatory Visit (HOSPITAL_BASED_OUTPATIENT_CLINIC_OR_DEPARTMENT_OTHER): Payer: Self-pay | Admitting: Physical Medicine and Rehabilitation

## 2019-11-26 MED FILL — PRAVASTATIN NA 20 MG TAB: 20 | 90 days supply | Qty: 26 | Fill #1

## 2019-11-26 MED FILL — HYDROmorphone HCL 8 MG TABS: 8 | 30 days supply | Qty: 120 | Fill #0

## 2019-12-01 ENCOUNTER — Other Ambulatory Visit (INDEPENDENT_AMBULATORY_CARE_PROVIDER_SITE_OTHER): Payer: Self-pay | Admitting: Otolaryngology

## 2019-12-01 DIAGNOSIS — C4402 Squamous cell carcinoma of skin of lip: Secondary | ICD-10-CM | POA: Diagnosis not present

## 2019-12-01 DIAGNOSIS — D3701 Neoplasm of uncertain behavior of lip: Secondary | ICD-10-CM | POA: Diagnosis not present

## 2019-12-04 ENCOUNTER — Other Ambulatory Visit: Payer: Self-pay | Admitting: Otolaryngology

## 2019-12-04 ENCOUNTER — Other Ambulatory Visit (HOSPITAL_COMMUNITY): Payer: Self-pay | Admitting: Otolaryngology

## 2019-12-04 DIAGNOSIS — E1065 Type 1 diabetes mellitus with hyperglycemia: Secondary | ICD-10-CM | POA: Diagnosis not present

## 2019-12-04 DIAGNOSIS — C4402 Squamous cell carcinoma of skin of lip: Secondary | ICD-10-CM

## 2019-12-15 ENCOUNTER — Other Ambulatory Visit: Payer: Self-pay

## 2019-12-15 ENCOUNTER — Ambulatory Visit (HOSPITAL_COMMUNITY)
Admission: RE | Admit: 2019-12-15 | Discharge: 2019-12-15 | Disposition: A | Discharge status: Home / Self Care | Payer: HMO | Source: Ambulatory Visit | Attending: Otolaryngology | Admitting: Otolaryngology

## 2019-12-15 DIAGNOSIS — C4402 Squamous cell carcinoma of skin of lip: Secondary | ICD-10-CM | POA: Insufficient documentation

## 2019-12-15 DIAGNOSIS — I700 Atherosclerosis of aorta: Secondary | ICD-10-CM | POA: Insufficient documentation

## 2019-12-15 DIAGNOSIS — N323 Diverticulum of bladder: Secondary | ICD-10-CM | POA: Insufficient documentation

## 2019-12-15 DIAGNOSIS — N3090 Cystitis, unspecified without hematuria: Secondary | ICD-10-CM | POA: Diagnosis not present

## 2019-12-15 DIAGNOSIS — K449 Diaphragmatic hernia without obstruction or gangrene: Secondary | ICD-10-CM | POA: Diagnosis not present

## 2019-12-15 DIAGNOSIS — N3289 Other specified disorders of bladder: Secondary | ICD-10-CM | POA: Diagnosis not present

## 2019-12-15 LAB — GLUCOSE, CAPILLARY: Glucose-Capillary: 163 mg/dL — ABNORMAL HIGH (ref 70–99)

## 2019-12-15 MED ORDER — FLUDEOXYGLUCOSE F - 18 (FDG) INJECTION
12.6000 | Freq: Once | INTRAVENOUS | Status: AC | PRN
  Administered 2019-12-15: 12.6 via INTRAVENOUS

## 2019-12-19 ENCOUNTER — Other Ambulatory Visit: Payer: Self-pay | Admitting: Otolaryngology

## 2019-12-19 DIAGNOSIS — C009 Malignant neoplasm of lip, unspecified: Secondary | ICD-10-CM | POA: Diagnosis not present

## 2019-12-22 ENCOUNTER — Other Ambulatory Visit (HOSPITAL_COMMUNITY)
Admission: RE | Admit: 2019-12-22 | Discharge: 2019-12-22 | Disposition: A | Discharge status: Home / Self Care | Payer: HMO | Source: Ambulatory Visit | Attending: Otolaryngology | Admitting: Otolaryngology

## 2019-12-22 ENCOUNTER — Other Ambulatory Visit: Payer: Self-pay

## 2019-12-22 DIAGNOSIS — Z20822 Contact with and (suspected) exposure to covid-19: Secondary | ICD-10-CM | POA: Insufficient documentation

## 2019-12-22 DIAGNOSIS — Z01812 Encounter for preprocedural laboratory examination: Secondary | ICD-10-CM | POA: Insufficient documentation

## 2019-12-22 NOTE — Progress Notes (Addendum)
PCP - Dr. Melinda Crutch Cardiologist - denies Endocrinologist - [facility] Dr. [name]   Chest x-ray - n/a EKG - 04/21/19 Stress Test - 11/16/16 ECHO - denies Cardiac Cath - denies  Fasting Blood Sugar - 180-270s per Omnipod the last 7 days Checks Blood Sugar 2-3x times a day  Pt instructed to call Dr. [name] or omnipod representative for basal rate adjustment d/t pt being NPO after midnight.   Diabetes coordinator also called - agreed pt needed to check with Dr. [name] or representative for basal rate adjustments, otherwise d/t type of surgery pt will be having, he should be fine with his current rate.   Blood Thinner Instructions: n/a Aspirin Instructions: Follow your surgeon's instructions on when to stop Aspirin.  If no instructions were given by your surgeon then you will need to call the office to get those instructions.    Per pt wife, pt last dose ASA 10/28  COVID TEST- 11/1 negative   Anesthesia review: yes   -------------  SDW INSTRUCTIONS:  Your procedure is scheduled on Wednesday 12/24/19.  Report to [facility] Main Entrance "A" at 0800 A.M., and check in at the Admitting office.  Call this number if you have problems the morning of surgery: [phone]   Remember: Do not eat or drink after midnight the night before your surgery  Take these medicines the morning of surgery with A SIP OF WATER: HYDROmorphone (DILAUDID) if needed lansoprazole (PREVACID)  insulin aspart (NOVOLOG) Day before surgery - take as usual AM dose, no PM dose Day of surgery - NONE - **if CBG is greater than 220, take half of usual correctional dose  ** PLEASE check your blood sugar the morning of your surgery when you wake up and every 2 hours until you get to the Short Stay unit.  If your blood sugar is less than 70 mg/dL, you will need to treat for low blood sugar: - Do not take insulin. - Treat a low blood sugar (less than 70 mg/dL) with  cup of clear juice (cranberry  or apple), 4 glucose tablets, OR glucose gel. - Recheck blood sugar in 15 minutes after treatment (to make sure it is greater than 70 mg/dL). If your blood sugar is not greater than 70 mg/dL on recheck, call [phone] for further instructions.   Follow your surgeon's instructions on when to stop Aspirin.  If no instructions were given by your surgeon then you will need to call the office to get those instructions.    As of today, STOP taking any Aleve, Naproxen, Ibuprofen, Motrin, Advil, Goody's, BC's, all herbal medications, fish oil, and all vitamins.    The Morning of Surgery  Do not wear jewelry  Do not wear lotions, powders, colognes, or deodorant Men may shave face and neck.  Do not bring valuables to the hospital.  [facility] is not responsible for any belongings or valuables.  If you are a smoker, DO NOT Smoke 24 hours prior to surgery  If you wear a CPAP at night please bring your mask the morning of surgery   Remember that you must have someone to transport you home after your surgery, and remain with you for 24 hours if you are discharged the same day.   Please bring cases for contacts, glasses, hearing aids, dentures or bridgework because it cannot be worn into surgery.    Leave your suitcase in the car.  After surgery it may be brought to your room.  For patients  admitted to the hospital, discharge time will be determined by your treatment team.  Patients discharged the day of surgery will not be allowed to drive home.    Special instructions:   [facility]- Preparing For Surgery  Oral Hygiene is also important to reduce your risk of infection.  Remember - BRUSH YOUR TEETH THE MORNING OF SURGERY WITH YOUR REGULAR TOOTHPASTE  Please follow these instructions carefully.   1. Shower the NIGHT BEFORE SURGERY and the MORNING OF SURGERY with DIAL Soap.   2. Wash thoroughly, paying special attention to the area where your surgery will be performed.  3. Thoroughly  rinse your body with warm water from the neck down.  4. Pat yourself dry with a CLEAN TOWEL.  5. Wear CLEAN PAJAMAS to bed the night before surgery  6. Place CLEAN SHEETS on your bed the night of your first shower and DO NOT SLEEP WITH PETS.  7. Wear comfortable clothes the morning of surgery.    Day of Surgery:  Please shower the morning of surgery with the DIAL soap Do not apply any deodorants/lotions. Please wear clean clothes to the hospital/surgery center.   Remember to brush your teeth WITH YOUR REGULAR TOOTHPASTE.   Please read over the following fact sheets that you were given.  Patient denies shortness of breath, fever, cough and chest pain.

## 2019-12-22 NOTE — Patient Outreach (Signed)
Derek [name]) Care Management Chronic Special Needs Program  12/22/2019  Name: Derek [name] DOB: August 07, 1945  MRN: [id number]  Derek [name] is enrolled in a chronic special needs plan for Diabetes. Reviewed and updated care plan.  Client gives permission to speak with wife Derek [name].  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  States he has not been able to complete his dental work because of the cancer on his lip.  Wife states that he is still eating soft foods as he has had his lower teeth pulled.  States he saw Dr. [name] on 11/13/19 and he is to get his Hemoglobin A1C rechecked next week..  States his last Hemoglobin A1C was still over 10%.  States he is now on an Omnipod insulin pump and his blood sugars have been more stable but still running higher from 180-300.  States Dr.Balan adjusted his basal rate last week and his blood sugars have been lower.  Denies any low blood sugars.  States his chronic back pain has not improved and his pain stimulator surgery is still on hold until he can get his Hemoglobin A1C lower. Denies any recent falls Denies any issues affording his medications.  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  States he had his flu shot 9/23/121 and he is to get his COVID booster in a few weeks.  States the [facility] doctor saw him a few weeks ago.  Goals Addressed              This Visit's Progress   .  Client will report improved coping  with chronic back pain by next 6 months(continue 10/01/19 (pt-stated)   No change     Reports pain continues to be difficult to control since temporary  pain stimulator removed Plan to continue to be followed by pain control/orthopedic doctor and call as needed Plan to try non medication methods to reduce pain such as meditation, heat or cold packs and massage    .  Client will report improved coping with  dental issues  by next 6 months(continued 12/22/19)   No change     Reports dental work on hold at this time Reinforced soft foods that are lower in carbohydrate and with more protein Reinforced drinking protein drinks such as Premier which are low in carbohydrate and sugar     .  Client will report improved coping with diabetes by next 6 months(continue 10/01/19)   No change     Reinforced to keep scheduled follow up appointment with Dr.Balan(endocrinologist) Plan to take bolus insulin as ordered when eating Encouraged to focus on getting blood sugars in range  Reinforced to call endocrinologist for elevated blood sugars  Reinforced to increase activity as tolerated     .  Client will report improved coping with squamous cell cancer by next 6 months        Reviewed to keep appointments with providers      .  Client will report no fall or injuries in the next 6 months.(continue 10/01/19   On track     No reports of falls  Reinforced fall precautions and home safety Stand up slowly Use cane or walker at all times if needed Use grabber to pick up objects on the floor Keep home well-lit and wear your glasses     .  Client will verbalize  knowledge of chronic lung disease as evidenced by no ED visits or Inpatient stays related to chronic lung disease    On track     Reports no issues with lungs/chronic bronchitis No ED or hospital visits for lung issues    .  Client will verbalize knowledge of self management of Hypertension as evidences by BP reading of 140/90 or less; or as defined by provider   On track     Reports B/P less than 140/90 when checked Plan to check B/P regularly Take B/P medications as ordered Plan to follow a low salt diet  Increase activity as tolerated    .  HEMOGLOBIN A1C < 7         Hemoglobin A1C 10.9% on 09/08/19  Reinforced fasting blood sugar goals of 80-130 and less than 180 1 1/2-2 hours after meals Reinforced signs and symptoms of hypoglycemia and actions to  take Reinforced carbohydrate counting and carbohydrate portion sizes Reinforced to eat soft foods with protein and to avoid concentrated sweets     .  Obtain Annual Foot Exam   On track     Check your skin and feet every day for cuts, bruises, redness, blisters, or sores. Schedule a foot exam with your health care provider once every year    .  COMPLETED: Obtain Hemoglobin A1C at least 2 times per year   On track     Last completed 06/25/19, 09/08/19 It is important to have your Hemoglobin A1C checked every 6 months if you are at goal and every 3 months if you are not at goal Goal completed 12/22/19    .  COMPLETED: Visit Primary Care Provider or Endocrinologist at least 2 times per year         Primary care visit 03/13/19, 07/01/19, 08/25/19 Endocrinology 04/15/19, 09/08/19, 11/13/19 [facility] provider 08/11/19, 12/08/19 next visit scheduled 06/03/20 Annual wellness visit 07/01/19 Plan to keep scheduled appointments with providers Goal completed 12/22/19       Plan:  Send successful outreach letter with a copy of their individualized care plan and Send individual care plan to provider  [place] Management will continue to provide services for this client through 02/20/2020. [facility] care management team will assume care 02/21/2020.   Peter Garter RN, [place], CDE Chronic Care Management Coordinator [facility] Management [phone]

## 2019-12-23 ENCOUNTER — Ambulatory Visit: Payer: Self-pay

## 2019-12-23 ENCOUNTER — Other Ambulatory Visit (HOSPITAL_BASED_OUTPATIENT_CLINIC_OR_DEPARTMENT_OTHER): Payer: Self-pay | Admitting: Endocrinology

## 2019-12-23 ENCOUNTER — Other Ambulatory Visit: Payer: Self-pay

## 2019-12-23 ENCOUNTER — Encounter (HOSPITAL_COMMUNITY): Payer: Self-pay | Admitting: Otolaryngology

## 2019-12-23 LAB — SARS CORONAVIRUS 2 (TAT 6-24 HRS): SARS Coronavirus 2: NEGATIVE

## 2019-12-23 MED FILL — NovoLOG 100 UNIT/ML SOLN: 100 | 30 days supply | Qty: 30 | Fill #0

## 2019-12-23 NOTE — Anesthesia Preprocedure Evaluation (Addendum)
Anesthesia Evaluation  Patient identified by MRN, date of birth, ID band Patient awake    Reviewed: Allergy & Precautions, NPO status , Patient's Chart, lab work & pertinent test results, reviewed documented beta blocker date and time   Airway Mallampati: II  TM Distance: >3 FB Neck ROM: Full    Dental no notable dental hx. (+) Edentulous Lower   Pulmonary shortness of breath and with exertion, sleep apnea , former smoker,  Welder's lung, chronic cough  Non-compliant with CPAP Former smoker, 68 pack years, quit 1977   Pulmonary exam normal breath sounds clear to auscultation       Cardiovascular hypertension, Pt. on medications and Pt. 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    Endo/Other  diabetes, Poorly Controlled, Type 2, Insulin DependentLast a1c 10.2 FS in preop in 200s  Renal/GU negative Renal ROS     Musculoskeletal  (+) Arthritis , Osteoarthritis,    Abdominal (+) + obese,   Peds  Hematology  (+) Blood dyscrasia, , Factor V leiden mutation   Anesthesia Other Findings Lip carcinoma   Reproductive/Obstetrics negative OB ROS                            Anesthesia Physical Anesthesia Plan  ASA: III  Anesthesia Plan: General   Post-op Pain Management:    Induction: Intravenous  PONV Risk Score and Plan: 2 and Ondansetron, Dexamethasone and Treatment may vary due to age or medical condition  Airway Management Planned: Oral ETT  Additional Equipment: None  Intra-op Plan:   Post-operative Plan: Extubation in OR  Informed Consent: I have reviewed the  patients History and Physical, chart, labs and discussed the procedure including the risks, benefits and alternatives for the proposed anesthesia with the patient or authorized representative who has indicated his/her understanding and acceptance.     Dental advisory given  Plan Discussed with: CRNA  Anesthesia Plan Comments: (Insulin pump running at 1/2 basal rate, will supplement with IV insulin as needed during the procedure.)       Anesthesia Quick Evaluation

## 2019-12-23 NOTE — Progress Notes (Signed)
Anesthesia Chart Review:  Case: [id number] Date/Time: 12/24/19 1015   Procedure: EXCISION LIP CARCINOMA (N/A )   Anesthesia type: General   Pre-op diagnosis: LIP CARCINOMA   Location: [facility] OR ROOM 08 / [facility]   Surgeons: Leta Baptist, MD      DISCUSSION: Patient is a 74 year old male scheduled for the above procedure.   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  12/22/19 presurgical COVID-19 test negative. He is for labs and anesthesia team evaluation on the day of surgery. He has an Omnipod DASH disposal insulin pump. Reported home blood glucose readings ~ 180-270's. Last ASA 12/18/19.   VS: Ht 6' (1.829 m)   Wt 113.4 kg   BMI 33.91 kg/m  BP Readings from Last 3 Encounters:  05/12/19 118/72  04/22/19 (!) 146/65  02/08/19 128/78   Pulse Readings from Last 3 Encounters:  05/12/19 78  04/22/19 85  02/08/19 88    PROVIDERS: [name], MD is PCP Franchot Gallo, MD is urologist Jacelyn Pi, MD is endocrinologist Armandina Gemma, MD is general surgeon  -He is not followed routinely by cardiology. but saw Minus Breeding, MD in 2019 for syncope and Revankar, Sunny Schlein, MD in 2019 for chest pain (low risk stress test). No clear etiology of syncope on event monitor, echo, or carotid Duplex. As needed cardiology follow-up.     LABS: For day of surgery. As of 05/11/19, Cr 0.94, H/H 12.5/38.7, platelets 354, glucose 233.    IMAGES: PET Scan 12/15/19: IMPRESSION: 1. Left level 2 lymph node is hypometabolic with  respect to blood pool. No evidence of metastatic disease. 2. Borderline hypermetabolic lymph nodes in the chest and pelvis, stable in size from 04/18/2016 favoring a benign etiology. 3. Irregular bladder wall thickening and right posterolateral bladder diverticulum with surrounding stranding and haziness. Findings can be seen with cystitis. Difficult to exclude malignancy. Please correlate clinically. 4. Moderate hiatal hernia. 5.  Aortic atherosclerosis (ICD10-I70.0).   EKG: 04/21/19: Normal sinus rhythm Left axis deviation Incomplete right bundle branch block Abnormal ECG No significant change since last tracing Confirmed by Charolette Forward ([id number]) on 04/21/2019 9:45:57 PM   CV: Carotid US 08/01/17: Final Interpretation:  - Right Carotid: Velocities in the right ICA are consistent with a 1-39% stenosis.  - Left Carotid: Velocities in the left ICA are consistent with a 1-39% stenosis.  - Vertebrals: Bilateral vertebral arteries demonstrate antegrade flow.  - Subclavians: Right subclavian artery flow was disturbed. Normal flow hemodynamics were seen in the left subclavian artery.    Cardiac event monitor 08/21/17-09/13/17: Study Highlights NSR, No significant arrhythmias   Nuclear stress test 11/16/16:  Nuclear stress EF: 59%.  There was no ST segment deviation noted during stress.  This is a low risk study.  The left ventricular ejection fraction is normal (55-65%).  1. EF 59%, normal wall motion.  2. Fixed small, mild basal to mid inferior perfusion defect.  Given normal wall motion, this may be diaphragmatic attenuation.  Reversible, small, mild apical anterior perfusion defect. Cannot rule out small area of ischemia.  - Overall, low risk study.    Past Medical History:  Diagnosis Date  .  Anxiety   . Arthritis   . Bilateral leg cramps    takes magnesium  . BPH (benign prostatic hyperplasia)   . DDD (degenerative disc disease), lumbar    gets back injections   .  Deafness in right ear    meniere's disease  . Depression   . Dermatitis    bilateral hands  . 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 Factor V Leiden mutation ([place])    "never had blood clots"  . GERD (gastroesophageal reflux disease)   . Hiatal hernia   . History of kidney stones    multiple kidney stones-not a problem at present  . 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 History of panic attacks   . 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 Hypertension   . Incomplete right bundle branch block   . Insulin dependent type 2 diabetes mellitus, uncontrolled ([place]) followed by dr [name] ([place])   dx 2007--- ltype 1 now since jan 2017 surgery with part of pancreas removed  . Lower urinary tract symptoms (LUTS)   . Meniere's disease dx 1970s   intermittant vertigo  . Obstructive sleep apnea    " i used to have sleep apnea" never used a c-pap   . Open wound of abdomen    WET/DRY DRESSING CHANGES TID--- POST ABD. SURGERY 09-21-2016 healed now  . Peripheral vascular disease ([facility])    right leg - decreased pulse   . Wears partial dentures    LOWER  . 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 ANAL FISTULECTOMY N/A 04/01/2013   Procedure: EXAM UNDER ANESTHESIA WITH ANAL FISSURectomy, sphincterotomy and internal hemorrhoidectomy;  Surgeon: Harl Bowie, MD;  Location: [facility];  Service: General;  Laterality: N/A;  . APPLICATION OF WOUND VAC N/A 05/16/2015   Procedure: APPLICATION OF WOUND VAC;  Surgeon: Armandina Gemma, MD;  Location: [facility];  Service: General;  Laterality: N/A;  . APPLICATION OF WOUND VAC N/A 05/20/2015   Procedure: EXHANGE OF ABDOMINAL  WOUND VAC  DRESSING;  Surgeon: Armandina Gemma, MD;  Location: [facility];  Service: General;  Laterality: N/A;  . COLONOSCOPY    . CYSTOSCOPY WITH INSERTION OF UROLIFT N/A 10/12/2016   Procedure: CYSTOSCOPY WITH INSERTION OF UROLIFT;  Surgeon: Franchot Gallo, MD;  Location: [facility];  Service: Urology;  Laterality: N/A;  . EUS N/A 12/31/2014   Procedure: UPPER ENDOSCOPIC ULTRASOUND (EUS) LINEAR;  Surgeon: Milus Banister, MD;  Location: [facility];  Service: Endoscopy;  Laterality: N/A;  . HARDWARE REMOVAL Left 2013   left ankle  . HEMORRHOIDECTOMY WITH HEMORRHOID BANDING    . INCISION AND DRAINAGE ABSCESS N/A 05/14/2015   Procedure: DRAINAGE OF INFECTED PANCREATIC PSEUDOCYST;  Surgeon: Armandina Gemma, MD;  Location: [facility];  Service: General;  Laterality: N/A;  . INCISION AND DRAINAGE OF WOUND N/A 05/16/2015   Procedure: IRRIGATION AND DEBRIDEMENT WOUND;  Surgeon: Armandina Gemma, MD;  Location: [facility];  Service: General;  Laterality: N/A;  . INCISIONAL HERNIA REPAIR N/A 11/12/2015   Procedure: [place];  Surgeon: Armandina Gemma, MD;  Location: [facility];  Service: General;  Laterality: N/A;  . INGUINAL HERNIA REPAIR Right 1974;  1982  . INSERTION OF MESH N/A 11/12/2015   Procedure: INSERTION OF MESH;  Surgeon: Armandina Gemma, MD;  Location: [facility];  Service: General;  Laterality: N/A;  . IR GENERIC HISTORICAL  04/20/2015   IR RADIOLOGIST EVAL & MGMT 04/20/2015 [facility] INTERV RAD  . KNEE ARTHROSCOPY Bilateral right 08-08-2000;  left 11-23-2000   meniscal repair and chondroplasty's  . KNEE ARTHROSCOPY  03/01/2012   Procedure: ARTHROSCOPY KNEE;  Surgeon: Gearlean Alf, MD;  [facility]  Service: Orthopedics;  Laterality: Left;  WITH SYNOVECTOMY   . LAPAROTOMY N/A 05/14/2015   Procedure: EXPLORATORY LAPAROTOMY;  Surgeon: Armandina Gemma, MD;  [facility];  Service: General;  Laterality: N/A;  . ORIF LEFT ANKLE FX  08/20/2009   distal tib-fib and malleolus  .  ROTATOR CUFF REPAIR Bilateral right 1994; left 1993, left 1993  . TOTAL KNEE ARTHROPLASTY Right 06/02/2013   Procedure: RIGHT TOTAL KNEE ARTHROPLASTY;  Surgeon: Gearlean Alf, MD;  [facility];  Service: Orthopedics;  Laterality: Right;  . TOTAL KNEE ARTHROPLASTY Left 01-31-2008   dr Wynelle Link  . TOTAL KNEE REVISION Right 01/23/2018   Procedure: right knee polyethylene revision;  Surgeon: Gaynelle Arabian, MD;  Location[facility];  Service: Orthopedics;  Laterality: Right;  84min  . TRANSURETHRAL RESECTION OF PROSTATE N/A 04/21/2019   Procedure: TRANSURETHRAL RESECTION OF THE PROSTATE (TURP);  Surgeon: Franchot Gallo, MD;  Location: [facility];  Service: Urology;  Laterality: N/A;  . UMBILICAL HERNIA REPAIR  08-11-1999   dr Ninfa Linden  . UPPER GASTROINTESTINAL ENDOSCOPY  sept 2016  . WOUND EXPLORATION N/A 09/21/2016   Procedure: WOUND EXPLORATION AND DEBRIDEMENT ABDOMINAL WALL;  Surgeon: Armandina Gemma, MD;  Location[facility];  Service: General;  Laterality: N/A;    MEDICATIONS: No current facility-administered medications for this encounter.   Marland Kitchen aspirin EC 81 MG tablet  . carvedilol (COREG) 25 MG tablet  . HYDROmorphone (DILAUDID) 4 MG tablet  . insulin aspart (NOVOLOG) 100 UNIT/ML injection  . Insulin Disposable Pump (OMNIPOD DASH 5 PACK PODS) MISC  . lansoprazole (PREVACID) 30 MG capsule  . losartan (COZAAR) 50 MG tablet  . magnesium oxide (MAG-OX) 400 MG tablet  . pravastatin (PRAVACHOL) 20 MG tablet  . Probiotic Product (PROBIOTIC DAILY PO)  . triamcinolone cream (KENALOG) 0.1 %  . insulin NPH Human (HUMULIN N,NOVOLIN N) 100 UNIT/ML injection    Myra Gianotti, PA-C Surgical Short Stay/Anesthesiology [facility] Phone [phone] [facility] Phone [phone] 12/23/2019 2:28 PM

## 2019-12-24 ENCOUNTER — Encounter (HOSPITAL_COMMUNITY)
Admission: RE | Disposition: A | Discharge status: Home / Self Care | Payer: Self-pay | Source: Ambulatory Visit | Attending: Otolaryngology

## 2019-12-24 ENCOUNTER — Ambulatory Visit (HOSPITAL_COMMUNITY): Payer: HMO | Admitting: Certified Registered Nurse Anesthetist

## 2019-12-24 ENCOUNTER — Encounter (HOSPITAL_COMMUNITY): Payer: Self-pay | Admitting: Otolaryngology

## 2019-12-24 ENCOUNTER — Other Ambulatory Visit: Payer: Self-pay

## 2019-12-24 ENCOUNTER — Other Ambulatory Visit (HOSPITAL_COMMUNITY): Payer: Self-pay | Admitting: Otolaryngology

## 2019-12-24 ENCOUNTER — Ambulatory Visit (HOSPITAL_COMMUNITY)
Admission: RE | Admit: 2019-12-24 | Discharge: 2019-12-24 | Disposition: A | Discharge status: Home / Self Care | Payer: HMO | Source: Ambulatory Visit | Attending: Otolaryngology | Admitting: Otolaryngology

## 2019-12-24 DIAGNOSIS — C001 Malignant neoplasm of external lower lip: Secondary | ICD-10-CM | POA: Diagnosis not present

## 2019-12-24 DIAGNOSIS — C009 Malignant neoplasm of lip, unspecified: Secondary | ICD-10-CM | POA: Diagnosis not present

## 2019-12-24 DIAGNOSIS — C4402 Squamous cell carcinoma of skin of lip: Secondary | ICD-10-CM | POA: Diagnosis not present

## 2019-12-24 DIAGNOSIS — Z9641 Presence of insulin pump (external) (internal): Secondary | ICD-10-CM | POA: Diagnosis not present

## 2019-12-24 DIAGNOSIS — Z794 Long term (current) use of insulin: Secondary | ICD-10-CM | POA: Diagnosis not present

## 2019-12-24 DIAGNOSIS — E1165 Type 2 diabetes mellitus with hyperglycemia: Secondary | ICD-10-CM | POA: Diagnosis not present

## 2019-12-24 DIAGNOSIS — I10 Essential (primary) hypertension: Secondary | ICD-10-CM | POA: Diagnosis not present

## 2019-12-24 HISTORY — PX: MASS EXCISION: SHX2000

## 2019-12-24 LAB — BASIC METABOLIC PANEL
Anion gap: 11 (ref 5–15)
BUN: 24 mg/dL — ABNORMAL HIGH (ref 8–23)
CO2: 19 mmol/L — ABNORMAL LOW (ref 22–32)
Calcium: 10.6 mg/dL — ABNORMAL HIGH (ref 8.9–10.3)
Chloride: 104 mmol/L (ref 98–111)
Creatinine, Ser: 1 mg/dL (ref 0.61–1.24)
GFR, Estimated: >60 mL/min (ref >60)
Glucose, Bld: 270 mg/dL — ABNORMAL HIGH (ref 70–99)
Potassium: 5 mmol/L (ref 3.5–5.1)
Sodium: 134 mmol/L — ABNORMAL LOW (ref 135–145)

## 2019-12-24 LAB — CBC
HCT: 42.9 % (ref 39.0–52.0)
Hemoglobin: 13.6 g/dL (ref 13.0–17.0)
MCH: 28.3 pg (ref 26.0–34.0)
MCHC: 31.7 g/dL (ref 30.0–36.0)
MCV: 89.4 fL (ref 80.0–100.0)
Platelets: 403 10*3/uL — ABNORMAL HIGH (ref 150–400)
RBC: 4.8 MIL/uL (ref 4.22–5.81)
RDW: 13.3 % (ref 11.5–15.5)
WBC: 7.2 10*3/uL (ref 4.0–10.5)
nRBC: 0 % (ref 0.0–0.2)

## 2019-12-24 LAB — GLUCOSE, CAPILLARY
Glucose-Capillary: 244 mg/dL — ABNORMAL HIGH (ref 70–99)
Glucose-Capillary: 236 mg/dL — ABNORMAL HIGH (ref 70–99)
Glucose-Capillary: 209 mg/dL — ABNORMAL HIGH (ref 70–99)

## 2019-12-24 SURGERY — EXCISION MASS
Anesthesia: General | Site: Face

## 2019-12-24 MED ORDER — CEFAZOLIN SODIUM-DEXTROSE 2-3 GM-%(50ML) IV SOLR
INTRAVENOUS | Status: DC | PRN
  Administered 2019-12-24: 2 g via INTRAVENOUS

## 2019-12-24 MED ORDER — DEXAMETHASONE SODIUM PHOSPHATE 10 MG/ML IJ SOLN
INTRAMUSCULAR | Status: AC
  Filled 2019-12-24: qty 1

## 2019-12-24 MED ORDER — ROCURONIUM BROMIDE 10 MG/ML (PF) SYRINGE
PREFILLED_SYRINGE | INTRAVENOUS | Status: AC
  Filled 2019-12-24: qty 10

## 2019-12-24 MED ORDER — BACITRACIN ZINC 500 UNIT/GM EX OINT
TOPICAL_OINTMENT | CUTANEOUS | Status: DC | PRN
  Administered 2019-12-24: 1 via TOPICAL

## 2019-12-24 MED ORDER — INSULIN REGULAR(HUMAN) IN NACL 100-0.9 UT/100ML-% IV SOLN
INTRAVENOUS | Status: DC
  Filled 2019-12-24: qty 100

## 2019-12-24 MED ORDER — ONDANSETRON HCL 4 MG/2ML IJ SOLN
INTRAMUSCULAR | Status: DC | PRN
  Administered 2019-12-24: 4 mg via INTRAVENOUS

## 2019-12-24 MED ORDER — KETAMINE HCL 10 MG/ML IJ SOLN
INTRAMUSCULAR | Status: DC | PRN
  Administered 2019-12-24: 50 mg via INTRAVENOUS

## 2019-12-24 MED ORDER — EPHEDRINE SULFATE-NACL 50-0.9 MG/10ML-% IV SOSY
PREFILLED_SYRINGE | INTRAVENOUS | Status: DC | PRN
  Administered 2019-12-24: 10 mg via INTRAVENOUS
  Administered 2019-12-24 (×3): 5 mg via INTRAVENOUS
  Administered 2019-12-24: 10 mg via INTRAVENOUS

## 2019-12-24 MED ORDER — OXYCODONE HCL 5 MG PO TABS: 5.0000 mg | ORAL_TABLET | Freq: Once | ORAL | Status: DC | PRN

## 2019-12-24 MED ORDER — LIDOCAINE-EPINEPHRINE 1 %-1:100000 IJ SOLN
INTRAMUSCULAR | Status: DC | PRN
  Administered 2019-12-24: 4 mL

## 2019-12-24 MED ORDER — LIDOCAINE 2% (20 MG/ML) 5 ML SYRINGE
INTRAMUSCULAR | Status: DC | PRN
  Administered 2019-12-24: 100 mg via INTRAVENOUS

## 2019-12-24 MED ORDER — CEFAZOLIN SODIUM 1 G IJ SOLR
INTRAMUSCULAR | Status: AC
  Filled 2019-12-24: qty 20

## 2019-12-24 MED ORDER — PHENYLEPHRINE 40 MCG/ML (10ML) SYRINGE FOR IV PUSH (FOR BLOOD PRESSURE SUPPORT)
PREFILLED_SYRINGE | INTRAVENOUS | Status: DC | PRN
  Administered 2019-12-24: 100 ug via INTRAVENOUS
  Administered 2019-12-24: 80 ug via INTRAVENOUS
  Administered 2019-12-24: 100 ug via INTRAVENOUS
  Administered 2019-12-24: 80 ug via INTRAVENOUS

## 2019-12-24 MED ORDER — 0.9 % SODIUM CHLORIDE (POUR BTL) OPTIME
TOPICAL | Status: DC | PRN
  Administered 2019-12-24: 1000 mL

## 2019-12-24 MED ORDER — LIDOCAINE 2% (20 MG/ML) 5 ML SYRINGE
INTRAMUSCULAR | Status: AC
  Filled 2019-12-24: qty 5

## 2019-12-24 MED ORDER — FENTANYL CITRATE (PF) 250 MCG/5ML IJ SOLN
INTRAMUSCULAR | Status: DC | PRN
  Administered 2019-12-24 (×2): 50 ug via INTRAVENOUS

## 2019-12-24 MED ORDER — OXYMETAZOLINE HCL 0.05 % NA SOLN
NASAL | Status: AC
  Filled 2019-12-24: qty 30

## 2019-12-24 MED ORDER — LACTATED RINGERS IV SOLN: INTRAVENOUS | Status: DC

## 2019-12-24 MED ORDER — PROPOFOL 10 MG/ML IV BOLUS
INTRAVENOUS | Status: DC | PRN
  Administered 2019-12-24: 200 mg via INTRAVENOUS

## 2019-12-24 MED ORDER — PROPOFOL 10 MG/ML IV BOLUS
INTRAVENOUS | Status: AC
  Filled 2019-12-24: qty 20

## 2019-12-24 MED ORDER — ACETAMINOPHEN 500 MG PO TABS
1000.0000 mg | ORAL_TABLET | Freq: Once | ORAL | Status: AC
  Administered 2019-12-24: 1000 mg via ORAL
  Filled 2019-12-24: qty 2

## 2019-12-24 MED ORDER — ONDANSETRON HCL 4 MG/2ML IJ SOLN: 4.0000 mg | Freq: Once | INTRAMUSCULAR | Status: AC | PRN

## 2019-12-24 MED ORDER — OXYMETAZOLINE HCL 0.05 % NA SOLN
NASAL | Status: DC | PRN
  Administered 2019-12-24: 3 via NASAL

## 2019-12-24 MED ORDER — ORAL CARE MOUTH RINSE
15.0000 mL | Freq: Once | OROMUCOSAL | Status: AC
  Administered 2019-12-24: 15 mL via OROMUCOSAL

## 2019-12-24 MED ORDER — ONDANSETRON HCL 4 MG/2ML IJ SOLN
INTRAMUSCULAR | Status: AC
  Filled 2019-12-24: qty 2

## 2019-12-24 MED ORDER — INSULIN ASPART 100 UNIT/ML ~~LOC~~ SOLN
SUBCUTANEOUS | Status: AC
  Filled 2019-12-24: qty 1

## 2019-12-24 MED ORDER — FENTANYL CITRATE (PF) 100 MCG/2ML IJ SOLN: 25.0000 ug | INTRAMUSCULAR | Status: DC | PRN

## 2019-12-24 MED ORDER — ONDANSETRON HCL 4 MG/2ML IJ SOLN
INTRAMUSCULAR | Status: AC
  Administered 2019-12-24: 4 mg via INTRAVENOUS
  Filled 2019-12-24: qty 2

## 2019-12-24 MED ORDER — INSULIN ASPART 100 UNIT/ML ~~LOC~~ SOLN
10.0000 [IU] | Freq: Once | SUBCUTANEOUS | Status: AC
  Administered 2019-12-24: 10 [IU] via SUBCUTANEOUS

## 2019-12-24 MED ORDER — AMOXICILLIN 875 MG PO TABS: 875.0000 mg | ORAL_TABLET | Freq: Two times a day (BID) | ORAL | 0 refills | Status: DC

## 2019-12-24 MED ORDER — CHLORHEXIDINE GLUCONATE 0.12 % MT SOLN: 15.0000 mL | Freq: Once | OROMUCOSAL | Status: AC

## 2019-12-24 MED ORDER — FENTANYL CITRATE (PF) 250 MCG/5ML IJ SOLN
INTRAMUSCULAR | Status: AC
  Filled 2019-12-24: qty 5

## 2019-12-24 MED ORDER — OXYCODONE HCL 5 MG/5ML PO SOLN: 5.0000 mg | Freq: Once | ORAL | Status: DC | PRN

## 2019-12-24 MED ORDER — BACITRACIN ZINC 500 UNIT/GM EX OINT
TOPICAL_OINTMENT | CUTANEOUS | Status: AC
  Filled 2019-12-24: qty 28.35

## 2019-12-24 MED ORDER — ROCURONIUM BROMIDE 10 MG/ML (PF) SYRINGE
PREFILLED_SYRINGE | INTRAVENOUS | Status: DC | PRN
  Administered 2019-12-24: 60 mg via INTRAVENOUS

## 2019-12-24 MED ORDER — SUGAMMADEX SODIUM 200 MG/2ML IV SOLN
INTRAVENOUS | Status: DC | PRN
  Administered 2019-12-24: 200 mg via INTRAVENOUS

## 2019-12-24 MED ORDER — BSS IO SOLN
INTRAOCULAR | Status: AC
  Filled 2019-12-24: qty 15

## 2019-12-24 MED ORDER — LIDOCAINE-EPINEPHRINE 1 %-1:100000 IJ SOLN
INTRAMUSCULAR | Status: AC
  Filled 2019-12-24: qty 1

## 2019-12-24 MED ORDER — INSULIN REGULAR BOLUS VIA INFUSION
10.0000 [IU] | INTRAVENOUS | Status: AC
  Administered 2019-12-24: 10 [IU] via INTRAVENOUS
  Filled 2019-12-24: qty 10

## 2019-12-24 MED ORDER — KETAMINE HCL 50 MG/5ML IJ SOSY
PREFILLED_SYRINGE | INTRAMUSCULAR | Status: AC
  Filled 2019-12-24: qty 5

## 2019-12-24 MED FILL — AMOXICILLIN 875 MG TABS: 875 | 5 days supply | Qty: 10 | Fill #0

## 2019-12-24 MED FILL — HYDROmorphone HCL 8 MG TABS: 8 | 30 days supply | Qty: 120 | Fill #0

## 2019-12-24 SURGICAL SUPPLY — 60 items
ADH SKN CLS APL DERMABOND .7 (GAUZE/BANDAGES/DRESSINGS) ×1
ATTRACTOMAT 16X20 MAGNETIC DRP (DRAPES) IMPLANT
BLADE SURG 15 STRL LF DISP TIS (BLADE) IMPLANT
BLADE SURG 15 STRL SS (BLADE) ×2
BNDG CONFORM 2 STRL LF (GAUZE/BANDAGES/DRESSINGS) IMPLANT
BNDG GAUZE ELAST 4 BULKY (GAUZE/BANDAGES/DRESSINGS) IMPLANT
CANISTER SUCT 3000ML PPV (MISCELLANEOUS) IMPLANT
CATH ROBINSON RED A/P 16FR (CATHETERS) IMPLANT
CLEANER TIP ELECTROSURG 2X2 (MISCELLANEOUS) ×2 IMPLANT
CNTNR URN SCR LID CUP LEK RST (MISCELLANEOUS) ×1 IMPLANT
CONT SPEC 4OZ STRL OR WHT (MISCELLANEOUS) ×4
COVER SURGICAL LIGHT HANDLE (MISCELLANEOUS) ×2 IMPLANT
COVER WAND RF STERILE (DRAPES) ×1 IMPLANT
DERMABOND ADVANCED (GAUZE/BANDAGES/DRESSINGS) ×1
DERMABOND ADVANCED .7 DNX12 (GAUZE/BANDAGES/DRESSINGS) ×1 IMPLANT
DRAIN PENROSE 1/4X12 LTX STRL (WOUND CARE) IMPLANT
DRAIN SNY 10 ROU (WOUND CARE) IMPLANT
DRAIN SNY 7 FPER (WOUND CARE) IMPLANT
DRAPE HALF SHEET 40X57 (DRAPES) IMPLANT
DRSG EMULSION OIL 3X3 NADH (GAUZE/BANDAGES/DRESSINGS) IMPLANT
DRSG TELFA 3X8 NADH (GAUZE/BANDAGES/DRESSINGS) ×2 IMPLANT
ELECT COATED BLADE 2.86 ST (ELECTRODE) ×2 IMPLANT
ELECT NDL TIP 2.8 STRL (NEEDLE) IMPLANT
ELECT NEEDLE TIP 2.8 STRL (NEEDLE) IMPLANT
ELECT REM PT RETURN 9FT ADLT (ELECTROSURGICAL) ×2
ELECTRODE REM PT RTRN 9FT ADLT (ELECTROSURGICAL) ×1 IMPLANT
GAUZE 4X4 16PLY RFD (DISPOSABLE) IMPLANT
GAUZE SPONGE 4X4 12PLY STRL (GAUZE/BANDAGES/DRESSINGS) IMPLANT
GLOVE ECLIPSE 7.5 STRL STRAW (GLOVE) ×2 IMPLANT
GOWN STRL REUS W/ TWL LRG LVL3 (GOWN DISPOSABLE) ×2 IMPLANT
GOWN STRL REUS W/ TWL XL LVL3 (GOWN DISPOSABLE) ×1 IMPLANT
GOWN STRL REUS W/TWL LRG LVL3 (GOWN DISPOSABLE) ×4
GOWN STRL REUS W/TWL XL LVL3 (GOWN DISPOSABLE) ×2
KIT BASIN OR (CUSTOM PROCEDURE TRAY) ×2 IMPLANT
KIT TURNOVER KIT B (KITS) ×2 IMPLANT
NDL 25GX 5/8IN NON SAFETY (NEEDLE) IMPLANT
NEEDLE 25GX 5/8IN NON SAFETY (NEEDLE) ×2 IMPLANT
NS IRRIG 1000ML POUR BTL (IV SOLUTION) ×2 IMPLANT
PAD ARMBOARD 7.5X6 YLW CONV (MISCELLANEOUS) ×4 IMPLANT
PAD DRESSING TELFA 3X8 NADH (GAUZE/BANDAGES/DRESSINGS) IMPLANT
PENCIL SMOKE EVACUATOR (MISCELLANEOUS) ×2 IMPLANT
POSITIONER HEAD DONUT 9IN (MISCELLANEOUS) IMPLANT
SPONGE GAUZE 2X2 8PLY STRL LF (GAUZE/BANDAGES/DRESSINGS) ×1 IMPLANT
SUT CHROMIC 4 0 P 3 18 (SUTURE) ×1 IMPLANT
SUT ETHILON 4 0 PS 2 18 (SUTURE) ×1 IMPLANT
SUT ETHILON 5 0 P 3 18 (SUTURE)
SUT NYLON ETHILON 5-0 P-3 1X18 (SUTURE) ×1 IMPLANT
SUT PROLENE 5 0 PS 2 (SUTURE) ×1 IMPLANT
SUT SILK 4 0 (SUTURE)
SUT SILK 4-0 18XBRD TIE 12 (SUTURE) ×1 IMPLANT
SUT VIC AB 4-0 SH 27 (SUTURE) ×4
SUT VIC AB 4-0 SH 27XANBCTRL (SUTURE) IMPLANT
SWAB COLLECTION DEVICE MRSA (MISCELLANEOUS) IMPLANT
SWAB CULTURE ESWAB REG 1ML (MISCELLANEOUS) IMPLANT
SYR BULB IRRIG 60ML STRL (SYRINGE) IMPLANT
SYR TB 1ML LUER SLIP (SYRINGE) IMPLANT
TOWEL GREEN STERILE FF (TOWEL DISPOSABLE) ×2 IMPLANT
TRAY ENT MC OR (CUSTOM PROCEDURE TRAY) ×2 IMPLANT
WATER STERILE IRR 1000ML POUR (IV SOLUTION) ×2 IMPLANT
YANKAUER SUCT BULB TIP NO VENT (SUCTIONS) ×1 IMPLANT

## 2019-12-24 NOTE — Discharge Instructions (Addendum)
The patient may resume his previous activities and diet.  He should apply antibiotic ointment to the external lower lip incision site 3 times a day for 1 week.  He will follow-up in my office in 1 week.

## 2019-12-24 NOTE — Progress Notes (Signed)
Pt. Insulin pump, omnipod at 50 percent.

## 2019-12-24 NOTE — Anesthesia Procedure Notes (Signed)
Procedure Name: Intubation Date/Time: 12/24/2019 9:26 AM Performed by: Dorthea Cove, CRNA Pre-anesthesia Checklist: Patient identified, Emergency Drugs available, Suction available and Patient being monitored Patient Re-evaluated:Patient Re-evaluated prior to induction Oxygen Delivery Method: Circle system utilized Preoxygenation: Pre-oxygenation with 100% oxygen Induction Type: IV induction Ventilation: Mask ventilation without difficulty and Nasal airway inserted- appropriate to patient size Laryngoscope Size: Glidescope and 3 Grade View: Grade I Tube type: Oral Nasal Tubes: Nasal prep performed, Nasal Rae and Magill forceps- large, utilized Tube size: 7.5 mm Number of attempts: 1 Airway Equipment and Method: Video-laryngoscopy Placement Confirmation: ETT inserted through vocal cords under direct vision,  positive ETCO2 and breath sounds checked- equal and bilateral Secured at: 30 cm Tube secured with: Tape Dental Injury: Teeth and Oropharynx as per pre-operative assessment  Comments: Attempted x1 with MAC 4 unable to pass tube through cords. Glidescope utilized on attempt #2 successful.

## 2019-12-24 NOTE — Op Note (Signed)
DATE OF PROCEDURE:  12/24/2019                              OPERATIVE REPORT  SURGEON:  Leta Baptist, MD  PREOPERATIVE DIAGNOSES: 1.  Lower lip squamous cell carcinoma.  POSTOPERATIVE DIAGNOSES: 1.  Lower lip squamous cell carcinoma.  PROCEDURE PERFORMED:  Excision of lower lip squamous cell carcinoma and local flap closure.  ANESTHESIA:  General trans-nasal endotracheal tube anesthesia.  COMPLICATIONS:  None.  ESTIMATED BLOOD LOSS:  Minimal.  INDICATION FOR PROCEDURE:  Derek [name] is a 74 y.o. male with a history of a growing 2cm lower lip ulcer. Punch biopsy was consistent with squamous cell carcinoma. Based on the above findings, the decision was made for the patient to undergo the above stated procedure.  The risks, benefits, alternatives, and details of the procedure were discussed with the patient.  Questions were invited and answered.  Informed consent was obtained.  DESCRIPTION:  The patient was taken to the operating room and placed supine on the operating table.  General transnasal endotracheal tube anesthesia was administered by the anesthesiologist.  The patient was positioned and prepped and draped in a standard fashion for his lip surgery.   Examination of his oral cavity showed a 2 cm circular ulcerative lesion on his lower lip.  1% lidocaine with 1-100,000 epinephrine was infiltrated at the planned site of incision.  A wedge incision was made to excise the entire ulcerative lesion.  The specimen was sent to the pathology department for permanent histologic identification.  Margins were then obtained from the left, right, and the inferior margins of the surgical field.  The frozen section analysis was negative for squamous cell carcinoma. A rotation flap was fashioned with a W-shaped flap. The surgical incision was closed in 3 layers with 4-0 Vicryl and 5-0 Prolene sutures.  The care of the patient was turned over to the anesthesiologist.  The patient was awakened from  anesthesia without difficulty.  The patient was extubated and transferred to the recovery room in good condition.  OPERATIVE FINDINGS:  Lower lip squamous cell carcinoma.  SPECIMEN: Lower lip squamous cell carcinoma and margins.  FOLLOWUP CARE:  The patient will be discharged home once awake and alert.   The patient will follow up in my office in 1 week.  [name] W Levi Crass 12/24/2019 10:50 AM

## 2019-12-24 NOTE — Transfer of Care (Signed)
Immediate Anesthesia Transfer of Care Note  Patient: Derek [name]  Procedure(s) Performed: EXCISION LIP CARCINOMA (N/A Face)  Patient Location: PACU  Anesthesia Type:General  Level of Consciousness: awake, alert  and oriented  Airway & Oxygen Therapy: Patient Spontanous Breathing and Patient connected to face mask oxygen  Post-op Assessment: Report given to RN and Post -op Vital signs reviewed and stable  Post vital signs: Reviewed and stable  Last Vitals:  Vitals Value Taken Time  BP 137/79 12/24/19 1100  Temp    Pulse 65 12/24/19 1105  Resp 16 12/24/19 1105  SpO2 98 % 12/24/19 1105  Vitals shown include unvalidated device data.  Last Pain:  Vitals:   12/24/19 0850  TempSrc:   PainSc: 9       Patients Stated Pain Goal: 5 (76/28/31 5176)  Complications: No complications documented.

## 2019-12-24 NOTE — Anesthesia Postprocedure Evaluation (Signed)
Anesthesia Post Note  Patient: Derek [name]  Procedure(s) Performed: EXCISION LIP CARCINOMA (N/A Face)     Patient location during evaluation: PACU Anesthesia Type: General Level of consciousness: awake and alert, oriented and patient cooperative Pain management: pain level controlled Vital Signs Assessment: post-procedure vital signs reviewed and stable Respiratory status: spontaneous breathing, nonlabored ventilation and respiratory function stable Cardiovascular status: blood pressure returned to baseline and stable Postop Assessment: no apparent nausea or vomiting Anesthetic complications: no   No complications documented.  Last Vitals:  Vitals:   12/24/19 1115 12/24/19 1130  BP: (!) 133/56 (!) 141/67  Pulse: 63 (!) 59  Resp: 15 12  Temp:  36.6 C  SpO2: 98% 98%    Last Pain:  Vitals:   12/24/19 1130  TempSrc:   PainSc: 0-No pain                 Pervis Hocking

## 2019-12-24 NOTE — H&P (Signed)
Cc: Lower lip cancer  HPI: The patient is a 74 year old male who returns today for his follow-up evaluation. The patient was last seen 3 weeks ago.  At that time, he was noted to have a 1.5 cm non-healing ulcer on his lower lip.  The appearance was suggestive of malignancy.  He underwent biopsy of the lower lip ulcer.  The pathology was consistent with squamous cell carcinoma.  The patient subsequently underwent a PET CT scan. The PET CT did not show any metastatic disease.  The patient returns today reporting the ulcer has slightly increased in size.  He is still tolerating oral intake well.  The patient has a long history of sun exposure.  No other ENT, GI, or respiratory issue noted since the last visit.   Exam: General: Communicates without difficulty, well nourished, no acute distress. Head: Normocephalic, no evidence injury, no tenderness, facial buttresses intact without stepoff. Face/sinus: No tenderness to palpation and percussion. Facial movement is normal and symmetric. Eyes: PERRL, EOMI. No scleral icterus, conjunctivae clear. Neuro: CN II exam reveals vision grossly intact.  No nystagmus at any point of gaze. Ears: Auricles well formed without lesions.  Ear canals are intact without mass or lesion.  No erythema or edema is appreciated.  The TMs are intact without fluid. Nose: External evaluation reveals normal support and skin without lesions.  Dorsum is intact.  Anterior rhinoscopy reveals pink mucosa over anterior aspect of inferior turbinates and intact septum.  No purulence noted. Oral:  Oral cavity and oropharynx are intact, symmetric, without erythema or edema. The patient has a growing lower lip squamous cell carcinoma.  It is currently approximately 2 cm in diameter.  Neck: Full range of motion without pain.  There is no significant lymphadenopathy.  No masses palpable.  Thyroid bed within normal limits to palpation.  Parotid glands and submandibular glands equal bilaterally without mass.   Trachea is midline. Neuro:  CN 2-12 grossly intact. Gait normal.   Assessment 1.  The patient has a growing lower lip squamous cell carcinoma.  It is currently approximately 2 cm in diameter.  2.  His recent PET scan showed no metastatic disease. 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    Plan  1.  The physical exam findings, pathology report, and the PET scan results are reviewed with the patient and his wife.   2.  Based on the above findings, the patient will need to undergo surgical excision of the lower lip squamous cell carcinoma. Based on the size of the surgical defect, an [place] rotation flap may be needed for reconstruction.  3.  The risks, benefits, alternatives and details of the procedure are extensively discussed with the patient.  Questions are invited and answered.  4.   The patient would like to proceed with the procedure.

## 2019-12-25 ENCOUNTER — Encounter (HOSPITAL_COMMUNITY): Payer: Self-pay | Admitting: Otolaryngology

## 2019-12-27 ENCOUNTER — Other Ambulatory Visit: Payer: Self-pay

## 2019-12-27 ENCOUNTER — Emergency Department (INDEPENDENT_AMBULATORY_CARE_PROVIDER_SITE_OTHER): Payer: HMO

## 2019-12-27 ENCOUNTER — Emergency Department (INDEPENDENT_AMBULATORY_CARE_PROVIDER_SITE_OTHER)
Admission: EM | Admit: 2019-12-27 | Discharge: 2019-12-27 | Disposition: A | Discharge status: Home / Self Care | Payer: HMO | Source: Home / Self Care | Attending: Family Medicine | Admitting: Family Medicine

## 2019-12-27 DIAGNOSIS — M25552 Pain in left hip: Secondary | ICD-10-CM

## 2019-12-27 DIAGNOSIS — S7002XA Contusion of left hip, initial encounter: Secondary | ICD-10-CM

## 2019-12-27 DIAGNOSIS — M25512 Pain in left shoulder: Secondary | ICD-10-CM

## 2019-12-27 DIAGNOSIS — M19012 Primary osteoarthritis, left shoulder: Secondary | ICD-10-CM | POA: Diagnosis not present

## 2019-12-27 DIAGNOSIS — S46002A Unspecified injury of muscle(s) and tendon(s) of the rotator cuff of left shoulder, initial encounter: Secondary | ICD-10-CM

## 2019-12-27 DIAGNOSIS — W010XXA Fall on same level from slipping, tripping and stumbling without subsequent striking against object, initial encounter: Secondary | ICD-10-CM | POA: Diagnosis not present

## 2019-12-27 DIAGNOSIS — S8001XA Contusion of right knee, initial encounter: Secondary | ICD-10-CM

## 2019-12-27 DIAGNOSIS — M25532 Pain in left wrist: Secondary | ICD-10-CM

## 2019-12-27 DIAGNOSIS — S60212A Contusion of left wrist, initial encounter: Secondary | ICD-10-CM

## 2019-12-27 DIAGNOSIS — S4992XA Unspecified injury of left shoulder and upper arm, initial encounter: Secondary | ICD-10-CM | POA: Diagnosis not present

## 2019-12-27 DIAGNOSIS — M5136 Other intervertebral disc degeneration, lumbar region: Secondary | ICD-10-CM | POA: Diagnosis not present

## 2019-12-27 DIAGNOSIS — Q7649 Other congenital malformations of spine, not associated with scoliosis: Secondary | ICD-10-CM | POA: Diagnosis not present

## 2019-12-27 DIAGNOSIS — S79912A Unspecified injury of left hip, initial encounter: Secondary | ICD-10-CM | POA: Diagnosis not present

## 2019-12-27 MED ORDER — ONDANSETRON HCL 4 MG/2ML IJ SOLN
4.0000 mg | Freq: Once | INTRAMUSCULAR | Status: AC
  Administered 2019-12-27: 4 mg via INTRAMUSCULAR

## 2019-12-27 NOTE — ED Triage Notes (Signed)
Pt states that he had a fall 12/26/2019. Pt states that he injured his left shoulder, left wrist, back and right knee.

## 2019-12-27 NOTE — Discharge Instructions (Addendum)
Wear sling left arm. Apply ice pack for 20 to 30 minutes to injured areas 3 to 4 times daily  Continue until pain and swelling decrease.  Begin left shoulder pendulum exercises as tolerated.

## 2019-12-27 NOTE — ED Provider Notes (Signed)
[name]    CSN: [id number] Arrival date & time: 12/27/19  1740      History   Chief Complaint Chief Complaint  Patient presents with  . Shoulder Pain    HPI Derek [name] is a 74 y.o. male.   While working on his trailer yesterday, patient slipped on cardboard, falling on his left shoulder and left hip.  He also complains of pain in his left wrist also.  Patient has had two surgeries in the past for left rotator cuff injuries.  He denies loss of consciousness, head/neck injury, chest pain, and shortness of breath.  The history is provided by the patient.    Past Medical History:  Diagnosis Date  . Anxiety   . Arthritis   . Bilateral leg cramps    takes magnesium  . BPH (benign prostatic hyperplasia)   . DDD (degenerative disc disease), lumbar    gets back injections   . Deafness in right ear    meniere's disease  . Depression   . Dermatitis    bilateral hands  . 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 Factor V Leiden mutation ([place])    "never had blood clots"  . GERD (gastroesophageal reflux disease)   . Hiatal hernia   . History of kidney stones    multiple kidney stones-not a problem at present  . 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 History of panic attacks   . 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 Hypertension   . Incomplete right bundle branch block   . Insulin dependent type 2 diabetes mellitus, uncontrolled ([place]) followed by dr [name] ([place])   dx 2007--- ltype 1 now since jan 2017 surgery with part of pancreas removed  . Lower urinary tract symptoms (LUTS)   . Meniere's disease dx 1970s   intermittant vertigo  . Obstructive sleep apnea    " i used to have sleep apnea" never  used a c-pap   . Open wound of abdomen    WET/DRY DRESSING CHANGES TID--- POST ABD. SURGERY 09-21-2016 healed now  . Peripheral vascular disease (HCC)    right leg - decreased pulse   . Wears partial dentures    LOWER  . 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 Enlarged prostate with urinary obstruction 04/21/2019  . 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 Foreign body of abdominal wall 09/04/2016  . Ventral incisional hernia 11/12/2015  . Incisional hernia 11/10/2015  . 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 Pleural effusion on left   . Lactose intolerance in adult 04/04/2015  . Protein-calorie malnutrition, moderate ([place]) 04/04/2015  . Pleural effusion, left   . Debility   . Benign essential HTN   . Leukocytosis   . Thrombocytosis   . Intra-abdominal abscess ([place])   . Poorly controlled type 2 diabetes mellitus ([place])   . 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 OA (osteoarthritis) of knee 06/02/2013  . Anal fissure 02/04/2013  . Meniere's disease   . GERD (gastroesophageal reflux disease)   . Kidney stone   . Obstructive  sleep apnea   . Morbid obesity ([place])   . Synovitis of knee 03/01/2012  . Chronic cough 11/03/2011  . COSTOCHONDRITIS, LEFT 10/08/2009  . RIB PAIN, LEFT SIDED 10/08/2009    Past Surgical History:  Procedure Laterality Date  . ANAL FISTULECTOMY N/A 04/01/2013   Procedure: EXAM UNDER ANESTHESIA WITH ANAL FISSURectomy, sphincterotomy and internal hemorrhoidectomy;  Surgeon: Harl Bowie, MD;  Location: [place];  Service: General;  Laterality: N/A;  . APPLICATION OF WOUND VAC N/A 05/16/2015   Procedure: APPLICATION OF WOUND VAC;  Surgeon: Armandina Gemma, MD;  [facility];  Service: General;  Laterality: N/A;  . APPLICATION OF WOUND VAC N/A 05/20/2015   Procedure: EXHANGE OF ABDOMINAL  WOUND VAC DRESSING;   Surgeon: Armandina Gemma, MD;  [facility];  Service: General;  Laterality: N/A;  . COLONOSCOPY    . CYSTOSCOPY WITH INSERTION OF UROLIFT N/A 10/12/2016   Procedure: CYSTOSCOPY WITH INSERTION OF UROLIFT;  Surgeon: Franchot Gallo, MD;  Location[facility];  Service: Urology;  Laterality: N/A;  . EUS N/A 12/31/2014   Procedure: UPPER ENDOSCOPIC ULTRASOUND (EUS) LINEAR;  Surgeon: Milus Banister, MD;  [facility];  Service: Endoscopy;  Laterality: N/A;  . HARDWARE REMOVAL Left 2013   left ankle  . HEMORRHOIDECTOMY WITH HEMORRHOID BANDING    . INCISION AND DRAINAGE ABSCESS N/A 05/14/2015   Procedure: DRAINAGE OF INFECTED PANCREATIC PSEUDOCYST;  Surgeon: Armandina Gemma, MD;  [facility];  Service: General;  Laterality: N/A;  . INCISION AND DRAINAGE OF WOUND N/A 05/16/2015   Procedure: IRRIGATION AND DEBRIDEMENT WOUND;  Surgeon: Armandina Gemma, MD;  [facility];  Service: General;  Laterality: N/A;  . INCISIONAL HERNIA REPAIR N/A 11/12/2015   Procedure: [place];  Surgeon: Armandina Gemma, MD;  Location: [place];  Service: General;  Laterality: N/A;  . INGUINAL HERNIA REPAIR Right 1974;  1982  . INSERTION OF MESH N/A 11/12/2015   Procedure: INSERTION OF MESH;  Surgeon: Armandina Gemma, MD;  Location: [place];  Service: General;  Laterality: N/A;  . IR GENERIC HISTORICAL  04/20/2015   IR RADIOLOGIST EVAL & MGMT 04/20/2015 [facility] INTERV RAD  . KNEE ARTHROSCOPY Bilateral right 08-08-2000;  left 11-23-2000   meniscal repair and chondroplasty's  . KNEE ARTHROSCOPY  03/01/2012   Procedure: ARTHROSCOPY KNEE;  Surgeon: Gearlean Alf, MD;  Location: [facility];  Service: Orthopedics;  Laterality: Left;  WITH SYNOVECTOMY   . LAPAROTOMY N/A 05/14/2015   Procedure: EXPLORATORY LAPAROTOMY;  Surgeon: Armandina Gemma, MD;  [facility];  Service: General;  Laterality: N/A;  . MASS EXCISION N/A 12/24/2019   Procedure: EXCISION LIP CARCINOMA;  Surgeon:  Leta Baptist, MD;  Location: [place];  Service: ENT;  Laterality: N/A;  . ORIF LEFT ANKLE FX  08/20/2009   distal tib-fib and malleolus  . ROTATOR CUFF REPAIR Bilateral right 1994; left 1993, left 1993  . TOTAL KNEE ARTHROPLASTY Right 06/02/2013   Procedure: RIGHT TOTAL KNEE ARTHROPLASTY;  Surgeon: Gearlean Alf, MD;  [facility];  Service: Orthopedics;  Laterality: Right;  . TOTAL KNEE ARTHROPLASTY Left 01-31-2008   dr Wynelle Link  . TOTAL KNEE REVISION Right 01/23/2018   Procedure: right knee polyethylene revision;  Surgeon: Gaynelle Arabian, MD;  [facility];  Service: Orthopedics;  Laterality: Right;  30min  . TRANSURETHRAL RESECTION OF PROSTATE N/A 04/21/2019   Procedure: TRANSURETHRAL RESECTION OF THE PROSTATE (TURP);  Surgeon: Franchot Gallo, MD;  Location:  [place];  Service: Urology;  Laterality: N/A;  . UMBILICAL HERNIA REPAIR  08-11-1999   dr Ninfa Linden  . UPPER GASTROINTESTINAL ENDOSCOPY  sept 2016  . WOUND EXPLORATION N/A 09/21/2016   Procedure: WOUND EXPLORATION AND DEBRIDEMENT ABDOMINAL WALL;  Surgeon: Armandina Gemma, MD;  [facility];  Service: General;  Laterality: N/A;       Home Medications    Prior to Admission medications   Medication Sig Start Date End Date Taking? Authorizing Provider  amoxicillin (AMOXIL) 875 MG tablet Take 1 tablet (875 mg total) by mouth 2 (two) times daily for 5 days. 12/24/19 12/29/19 Yes Leta Baptist, MD  aspirin EC 81 MG tablet Take 81 mg by mouth at bedtime. Swallow whole.   Yes [provider]  carvedilol (COREG) 25 MG tablet Take 0.5 tablets (12.5 mg total) by mouth 2 (two) times daily with a meal. Patient taking differently: Take 25 mg by mouth every evening.  04/06/15  Yes Michael Boston, MD  HYDROmorphone (DILAUDID) 4 MG tablet Take 8 mg by mouth 4 (four) times daily as needed for moderate pain.    Yes [provider]  insulin aspart (NOVOLOG) 100 UNIT/ML injection Inject 0-9 Units into the skin 3 (three)  times daily before meals. Patient taking differently: Inject 5-30 Units into the skin 3 (three) times daily before meals. Per sliding scale 03/03/15  Yes Armandina Gemma, MD  Insulin Disposable Pump (OMNIPOD DASH 5 PACK PODS) MISC Inject into the skin continuous. Change every three days 12/15/19  Yes [provider]  insulin NPH Human (HUMULIN N,NOVOLIN N) 100 UNIT/ML injection Inject 25-50 Units into the skin See admin instructions. Inject 50 units SQ in the morning, inject 40  units SQ at lunch and inject 40 units SQ at bedtime   Yes [provider]  lansoprazole (PREVACID) 30 MG capsule Take 30 mg by mouth daily as needed (for acid reflux).    Yes [provider]  losartan (COZAAR) 50 MG tablet Take 50 mg by mouth at bedtime.    Yes [provider]  magnesium oxide (MAG-OX) 400 MG tablet Take 800 mg by mouth at bedtime. Triple complex   Yes [provider]  pravastatin (PRAVACHOL) 20 MG tablet Take 20 mg by mouth See admin instructions. Every Monday and Friday   Yes [provider]  Probiotic Product (PROBIOTIC DAILY PO) Take 1 tablet by mouth at bedtime.   Yes [provider]  triamcinolone cream (KENALOG) 0.1 % Apply 1 application topically daily as needed (Contact dermititis).  07/01/19  Yes [provider]    Family History Family History  Problem Relation Age of Onset  . Bone cancer Father        Jaw  . Factor V Leiden deficiency Daughter   . Diabetes Maternal Grandmother   . Heart disease Paternal Uncle   . Colon cancer Neg Hx   . Esophageal cancer Neg Hx   . Stomach cancer Neg Hx   . Rectal cancer Neg Hx     Social History Social History   Tobacco Use  . Smoking status: Former Smoker    Packs/day: 4.00    Years: 17.00    Pack years: 68.00    Types: Cigarettes    Quit date: 02/21/1975    Years since quitting: 44.8  . Smokeless tobacco: Never Used  Vaping Use  . Vaping Use: Never used  Substance Use  Topics  . Alcohol use: No    Alcohol/week: 0.0  standard drinks  . Drug use: No     Allergies   Duloxetine, Oxycontin [oxycodone hcl], Quinine, Voltaren [diclofenac sodium], Doxycycline, Hydrocodone, Zoloft [sertraline], and Morphine and related   Review of Systems Review of Systems  Constitutional: Positive for activity change. Negative for chills, diaphoresis, fatigue and fever.  HENT: Negative.   Eyes: Negative.   Respiratory: Negative.   Cardiovascular: Negative.   Gastrointestinal: Negative.   Genitourinary: Negative.   Musculoskeletal:       Left shoulder pain.  Left wrist pain. Left hip pain.  Skin: Negative.   Neurological: Negative.      Physical Exam Triage Vital Signs ED Triage Vitals  Enc Vitals Group     BP 12/27/19 1001 135/79     Pulse Rate 12/27/19 1001 65     Resp --      Temp 12/27/19 1001 98.1 F (36.7 C)     Temp Source 12/27/19 1001 Oral     SpO2 12/27/19 1001 97 %     Weight 12/27/19 0958 250 lb (113.4 kg)     Height 12/27/19 0958 6' (1.829 m)     Head Circumference --      Peak Flow --      Pain Score 12/27/19 0958 10     Pain Loc --      Pain Edu? --      Excl. in [place]? --    No data found.  Updated Vital Signs BP 135/79 (BP Location: Right Arm)   Pulse 65   Temp 98.1 F (36.7 C) (Oral)   Ht 6' (1.829 m)   Wt 113.4 kg   SpO2 97%   BMI 33.91 kg/m   Visual Acuity Right Eye Distance:   Left Eye Distance:   Bilateral Distance:    Right Eye Near:   Left Eye Near:    Bilateral Near:     Physical Exam Vitals and nursing note reviewed.  Constitutional:      General: He is not in acute distress.    Appearance: He is not ill-appearing.  HENT:     Head: Normocephalic and atraumatic.     Right Ear: External ear normal.     Left Ear: External ear normal.     Mouth/Throat:     Pharynx: Oropharynx is clear.  Eyes:     Conjunctiva/sclera: Conjunctivae normal.     Pupils: Pupils are equal, round, and reactive to light.    Cardiovascular:     Rate and Rhythm: Regular rhythm. Tachycardia present.     Heart sounds: Normal heart sounds.  Pulmonary:     Breath sounds: Normal breath sounds. No rales.  Abdominal:     Palpations: Abdomen is soft.     Tenderness: There is no abdominal tenderness.  Musculoskeletal:     Cervical back: Normal range of motion. No tenderness.     Comments: Left shoulder:  Unable to abduct more than 30 degrees from vertical (active and passive).  Decreased internal/external rotation/strength.  Unable to perform Apley's test.  Distal neurovascular function is intact.  Left wrist:  Minimal swelling and good range of motion.  Mild tenderness to palpation over ulnar stylus and snuffbox.  Left hip:  Tenderness over greater trochanter.  Good range of motion although here is mild pain with external rotation.  Skin:    General: Skin is warm and dry.  Neurological:     General: No focal deficit present.     Mental Status: He is alert.  UC Treatments / Results  Labs (all labs ordered are listed, but only abnormal results are displayed) Labs Reviewed - No data to display  EKG   Radiology DG Wrist Complete Left  Result Date: 12/27/2019 CLINICAL DATA:  [place] yesterday, slipped on cardboard while working on a trailer, fell onto LEFT side of body, having LEFT hip pain, LEFT shoulder pain, LEFT wrist pain EXAM: LEFT WRIST - COMPLETE 3+ VIEW COMPARISON:  None FINDINGS: Osseous demineralization. Advanced degenerative changes at first [facility] joint with joint space narrowing, spur formation and radial subluxation. Scattered chondrocalcinosis especially triangular fibrocartilage complex. Additional degenerative changes at radiocarpal joint and distal radioulnar joint. No acute fracture, dislocation, or bone destruction. Metallic foreign body identified within soft tissues at volar aspect of LEFT thumb. IMPRESSION: Degenerative changes and chondrocalcinosis question CPPD as above. No acute  abnormalities. Electronically Signed   By: Lavonia Dana M.D.   On: 12/27/2019 12:25   DG Shoulder Left  Result Date: 12/27/2019 CLINICAL DATA:  [place] yesterday, slipped on car board while working on a trailer, fell onto LEFT side, having LEFT hip pain, LEFT shoulder pain, LEFT wrist pain, history of LEFT rotator cuff surgery EXAM: LEFT SHOULDER - 2+ VIEW COMPARISON:  None FINDINGS: Osseous demineralization. Mild degenerative changes at the [facility] joint with tiny amorphous and corticated calcifications, question calcific debris versus chondrocalcinosis. No acute fracture, dislocation, or bone destruction. IMPRESSION: Degenerative changes LEFT AC joint. No definite acute osseous findings. Electronically Signed   By: Lavonia Dana M.D.   On: 12/27/2019 12:20   DG Hip Unilat W or Wo Pelvis 2-3 Views Left  Result Date: 12/27/2019 CLINICAL DATA:  [place] yesterday, slipped on car board while working on a trailer, fell onto LEFT side, having LEFT hip pain, LEFT shoulder pain, LEFT wrist pain, history of LEFT rotator cuff surgery EXAM: DG HIP (WITH OR WITHOUT PELVIS) 2-3V LEFT COMPARISON:  09/25/2008 FINDINGS: Osseous mineralization low normal. Hip and SI joint spaces preserved. Mild degenerative disc disease changes at visualized lower lumbar spine with partial sacralization of RIGHT transverse process of L5. No acute fracture, dislocation, or bone destruction. Surgical clips at prostate bed IMPRESSION: No acute osseous abnormalities. Electronically Signed   By: Lavonia Dana M.D.   On: 12/27/2019 12:23    Procedures Procedures (including critical care time)  Medications Ordered in UC Medications  ondansetron (ZOFRAN) injection 4 mg (4 mg Intramuscular Given 12/27/19 1121)    Initial Impression / Assessment and Plan / UC Course  I have reviewed the triage vital signs and the nursing notes.  Pertinent labs & imaging results that were available during my care of the patient were reviewed by me and considered in my  medical decision making (see chart for details).    Patient developed episode nausea during exam; Administered Zofran ODT 4mg  PO  Dispensed sling left shoulder. Followup with orthopedist as soon as possible.    Final Clinical Impressions(s) / UC Diagnoses   Final diagnoses:  Rotator cuff injury, left, initial encounter  Contusion of left hip region  Contusion of left wrist, initial encounter  Contusion of right knee, initial encounter     Discharge Instructions     Wear sling left arm. Apply ice pack for 20 to 30 minutes to injured areas 3 to 4 times daily  Continue until pain and swelling decrease.  Begin left shoulder pendulum exercises as tolerated.    ED Prescriptions    None        Kandra Nicolas, MD 12/30/19  0949  

## 2019-12-29 LAB — SURGICAL PATHOLOGY

## 2020-01-05 ENCOUNTER — Other Ambulatory Visit (HOSPITAL_BASED_OUTPATIENT_CLINIC_OR_DEPARTMENT_OTHER): Payer: Self-pay | Admitting: Internal Medicine

## 2020-01-05 ENCOUNTER — Ambulatory Visit: Payer: HMO | Attending: Internal Medicine

## 2020-01-05 DIAGNOSIS — E1065 Type 1 diabetes mellitus with hyperglycemia: Secondary | ICD-10-CM | POA: Diagnosis not present

## 2020-01-05 DIAGNOSIS — Z23 Encounter for immunization: Secondary | ICD-10-CM

## 2020-01-06 MED FILL — PFIZER-BIONTECH COVID-19 VA: 30 | 1 days supply | Qty: 0 | Fill #0

## 2020-01-12 DIAGNOSIS — E1169 Type 2 diabetes mellitus with other specified complication: Secondary | ICD-10-CM | POA: Diagnosis not present

## 2020-01-12 DIAGNOSIS — I10 Essential (primary) hypertension: Secondary | ICD-10-CM | POA: Diagnosis not present

## 2020-01-12 DIAGNOSIS — E1165 Type 2 diabetes mellitus with hyperglycemia: Secondary | ICD-10-CM | POA: Diagnosis not present

## 2020-01-12 DIAGNOSIS — E119 Type 2 diabetes mellitus without complications: Secondary | ICD-10-CM | POA: Diagnosis not present

## 2020-01-12 DIAGNOSIS — M189 Osteoarthritis of first carpometacarpal joint, unspecified: Secondary | ICD-10-CM | POA: Diagnosis not present

## 2020-01-12 DIAGNOSIS — K219 Gastro-esophageal reflux disease without esophagitis: Secondary | ICD-10-CM | POA: Diagnosis not present

## 2020-01-12 DIAGNOSIS — F339 Major depressive disorder, recurrent, unspecified: Secondary | ICD-10-CM | POA: Diagnosis not present

## 2020-01-12 DIAGNOSIS — E7800 Pure hypercholesterolemia, unspecified: Secondary | ICD-10-CM | POA: Diagnosis not present

## 2020-01-19 ENCOUNTER — Other Ambulatory Visit (HOSPITAL_BASED_OUTPATIENT_CLINIC_OR_DEPARTMENT_OTHER): Payer: Self-pay | Admitting: Endocrinology

## 2020-01-19 MED FILL — ULTCARE INS SYR 1 ML 31GX5/: 31G X 5/16" | 30 days supply | Qty: 100 | Fill #0

## 2020-01-20 ENCOUNTER — Other Ambulatory Visit (HOSPITAL_BASED_OUTPATIENT_CLINIC_OR_DEPARTMENT_OTHER): Payer: Self-pay | Admitting: Chiropractic Medicine

## 2020-01-20 DIAGNOSIS — M7542 Impingement syndrome of left shoulder: Secondary | ICD-10-CM | POA: Diagnosis not present

## 2020-01-20 DIAGNOSIS — Z01818 Encounter for other preprocedural examination: Secondary | ICD-10-CM | POA: Diagnosis not present

## 2020-01-21 MED FILL — HYDROmorphone HCL 8 MG TABS: 8 | 30 days supply | Qty: 120 | Fill #0

## 2020-01-22 DIAGNOSIS — E109 Type 1 diabetes mellitus without complications: Secondary | ICD-10-CM | POA: Diagnosis not present

## 2020-01-23 ENCOUNTER — Other Ambulatory Visit: Payer: Self-pay

## 2020-01-23 MED FILL — LANSOPRAZOLE 30 MG CPDR: 30 | 90 days supply | Qty: 90 | Fill #1

## 2020-01-23 NOTE — Patient Outreach (Signed)
  [facility]) Care Management Chronic Special Needs Program    01/23/2020  Name: TRAMAYNE SEBESTA, DOB: 1945-08-15  MRN: [id number]   Mr. [name] Ionescu is enrolled in a chronic special needs plan for Diabetes.  [employer] Care Management Team has assumed care and services for this member. Case closed by [place] RN, [facility], [place] Management [phone]

## 2020-01-28 DIAGNOSIS — M25512 Pain in left shoulder: Secondary | ICD-10-CM | POA: Diagnosis not present

## 2020-02-02 MED FILL — CARVEDILOL 25 MG TABLET: 25 | 90 days supply | Qty: 90 | Fill #2

## 2020-02-02 MED FILL — LOSARTAN POTASSIUM 50 MG TA: 50 | 90 days supply | Qty: 90 | Fill #2

## 2020-02-04 DIAGNOSIS — E1065 Type 1 diabetes mellitus with hyperglycemia: Secondary | ICD-10-CM | POA: Diagnosis not present

## 2020-02-09 DIAGNOSIS — M25512 Pain in left shoulder: Secondary | ICD-10-CM | POA: Diagnosis not present

## 2020-02-11 ENCOUNTER — Other Ambulatory Visit (HOSPITAL_BASED_OUTPATIENT_CLINIC_OR_DEPARTMENT_OTHER): Payer: Self-pay | Admitting: Chiropractic Medicine

## 2020-02-11 DIAGNOSIS — G894 Chronic pain syndrome: Secondary | ICD-10-CM | POA: Diagnosis not present

## 2020-02-17 DIAGNOSIS — K219 Gastro-esophageal reflux disease without esophagitis: Secondary | ICD-10-CM | POA: Diagnosis not present

## 2020-02-17 DIAGNOSIS — I10 Essential (primary) hypertension: Secondary | ICD-10-CM | POA: Diagnosis not present

## 2020-02-17 DIAGNOSIS — E1169 Type 2 diabetes mellitus with other specified complication: Secondary | ICD-10-CM | POA: Diagnosis not present

## 2020-02-17 DIAGNOSIS — E119 Type 2 diabetes mellitus without complications: Secondary | ICD-10-CM | POA: Diagnosis not present

## 2020-02-17 DIAGNOSIS — F339 Major depressive disorder, recurrent, unspecified: Secondary | ICD-10-CM | POA: Diagnosis not present

## 2020-02-17 DIAGNOSIS — E1165 Type 2 diabetes mellitus with hyperglycemia: Secondary | ICD-10-CM | POA: Diagnosis not present

## 2020-02-17 DIAGNOSIS — M189 Osteoarthritis of first carpometacarpal joint, unspecified: Secondary | ICD-10-CM | POA: Diagnosis not present

## 2020-02-17 DIAGNOSIS — E7800 Pure hypercholesterolemia, unspecified: Secondary | ICD-10-CM | POA: Diagnosis not present

## 2020-02-17 MED FILL — HYDROmorphone HCL 8 MG TABS: 8 | 30 days supply | Qty: 210 | Fill #0

## 2020-02-23 MED FILL — NovoLOG 100 UNIT/ML SOLN: 100 | 30 days supply | Qty: 30 | Fill #1

## 2020-03-08 DIAGNOSIS — E1065 Type 1 diabetes mellitus with hyperglycemia: Secondary | ICD-10-CM | POA: Diagnosis not present

## 2020-03-10 DIAGNOSIS — M5459 Other low back pain: Secondary | ICD-10-CM | POA: Diagnosis not present

## 2020-03-10 DIAGNOSIS — M5136 Other intervertebral disc degeneration, lumbar region: Secondary | ICD-10-CM | POA: Diagnosis not present

## 2020-03-15 MED FILL — PRAVASTATIN NA 20 MG TAB: 20 | 90 days supply | Qty: 26 | Fill #2

## 2020-03-17 DIAGNOSIS — F339 Major depressive disorder, recurrent, unspecified: Secondary | ICD-10-CM | POA: Diagnosis not present

## 2020-03-17 DIAGNOSIS — M189 Osteoarthritis of first carpometacarpal joint, unspecified: Secondary | ICD-10-CM | POA: Diagnosis not present

## 2020-03-17 DIAGNOSIS — K219 Gastro-esophageal reflux disease without esophagitis: Secondary | ICD-10-CM | POA: Diagnosis not present

## 2020-03-17 DIAGNOSIS — E119 Type 2 diabetes mellitus without complications: Secondary | ICD-10-CM | POA: Diagnosis not present

## 2020-03-17 DIAGNOSIS — E7800 Pure hypercholesterolemia, unspecified: Secondary | ICD-10-CM | POA: Diagnosis not present

## 2020-03-17 DIAGNOSIS — E1165 Type 2 diabetes mellitus with hyperglycemia: Secondary | ICD-10-CM | POA: Diagnosis not present

## 2020-03-17 DIAGNOSIS — I10 Essential (primary) hypertension: Secondary | ICD-10-CM | POA: Diagnosis not present

## 2020-03-17 DIAGNOSIS — E1169 Type 2 diabetes mellitus with other specified complication: Secondary | ICD-10-CM | POA: Diagnosis not present

## 2020-03-18 ENCOUNTER — Other Ambulatory Visit (HOSPITAL_BASED_OUTPATIENT_CLINIC_OR_DEPARTMENT_OTHER): Payer: Self-pay | Admitting: Chiropractic Medicine

## 2020-03-18 MED FILL — HYDROmorphone HCL 8 MG TABS: 8 | 30 days supply | Qty: 210 | Fill #0

## 2020-03-24 DIAGNOSIS — I10 Essential (primary) hypertension: Secondary | ICD-10-CM | POA: Diagnosis not present

## 2020-03-24 DIAGNOSIS — E119 Type 2 diabetes mellitus without complications: Secondary | ICD-10-CM | POA: Diagnosis not present

## 2020-03-24 DIAGNOSIS — E1169 Type 2 diabetes mellitus with other specified complication: Secondary | ICD-10-CM | POA: Diagnosis not present

## 2020-03-24 DIAGNOSIS — M189 Osteoarthritis of first carpometacarpal joint, unspecified: Secondary | ICD-10-CM | POA: Diagnosis not present

## 2020-03-24 DIAGNOSIS — E1165 Type 2 diabetes mellitus with hyperglycemia: Secondary | ICD-10-CM | POA: Diagnosis not present

## 2020-03-24 DIAGNOSIS — K219 Gastro-esophageal reflux disease without esophagitis: Secondary | ICD-10-CM | POA: Diagnosis not present

## 2020-03-24 DIAGNOSIS — F339 Major depressive disorder, recurrent, unspecified: Secondary | ICD-10-CM | POA: Diagnosis not present

## 2020-03-24 DIAGNOSIS — E7800 Pure hypercholesterolemia, unspecified: Secondary | ICD-10-CM | POA: Diagnosis not present

## 2020-03-26 ENCOUNTER — Other Ambulatory Visit (HOSPITAL_BASED_OUTPATIENT_CLINIC_OR_DEPARTMENT_OTHER): Payer: Self-pay

## 2020-03-26 MED FILL — CLINDAMYCIN HCL 300 MG CAP: 300 | 8 days supply | Qty: 24 | Fill #0

## 2020-03-31 ENCOUNTER — Other Ambulatory Visit (HOSPITAL_BASED_OUTPATIENT_CLINIC_OR_DEPARTMENT_OTHER): Payer: Self-pay | Admitting: Endocrinology

## 2020-03-31 MED FILL — TRESIBA FLEXTOUCH 100 UNITS: 100 | 30 days supply | Qty: 3 | Fill #0

## 2020-04-05 MED FILL — ULTCARE INS SYR 1 ML 31GX5/: 31G X 5/16" | 30 days supply | Qty: 100 | Fill #1

## 2020-04-08 DIAGNOSIS — E1065 Type 1 diabetes mellitus with hyperglycemia: Secondary | ICD-10-CM | POA: Diagnosis not present

## 2020-04-16 ENCOUNTER — Other Ambulatory Visit (HOSPITAL_BASED_OUTPATIENT_CLINIC_OR_DEPARTMENT_OTHER): Payer: Self-pay | Admitting: Chiropractic Medicine

## 2020-04-16 MED FILL — HYDROmorphone HCL 8 MG TABS: 8 | 30 days supply | Qty: 210 | Fill #0

## 2020-04-26 ENCOUNTER — Other Ambulatory Visit (HOSPITAL_BASED_OUTPATIENT_CLINIC_OR_DEPARTMENT_OTHER): Payer: Self-pay | Admitting: Family Medicine

## 2020-04-26 MED FILL — NovoLOG 100 UNIT/ML SOLN: 100 | 30 days supply | Qty: 30 | Fill #2

## 2020-04-26 MED FILL — CELECOXIB 200 MG CAP: 200 | 30 days supply | Qty: 60 | Fill #0

## 2020-04-27 DIAGNOSIS — M25512 Pain in left shoulder: Secondary | ICD-10-CM | POA: Diagnosis not present

## 2020-04-29 DIAGNOSIS — Z85819 Personal history of malignant neoplasm of unspecified site of lip, oral cavity, and pharynx: Secondary | ICD-10-CM | POA: Diagnosis not present

## 2020-04-30 ENCOUNTER — Other Ambulatory Visit (HOSPITAL_BASED_OUTPATIENT_CLINIC_OR_DEPARTMENT_OTHER): Payer: Self-pay | Admitting: Family Medicine

## 2020-04-30 MED FILL — LANSOPRAZOLE 30 MG CPDR: 30 | 90 days supply | Qty: 90 | Fill #0

## 2020-05-05 DIAGNOSIS — M545 Low back pain, unspecified: Secondary | ICD-10-CM | POA: Diagnosis not present

## 2020-05-05 DIAGNOSIS — E1342 Other specified diabetes mellitus with diabetic polyneuropathy: Secondary | ICD-10-CM | POA: Diagnosis not present

## 2020-05-05 DIAGNOSIS — M25512 Pain in left shoulder: Secondary | ICD-10-CM | POA: Diagnosis not present

## 2020-05-10 DIAGNOSIS — G894 Chronic pain syndrome: Secondary | ICD-10-CM | POA: Diagnosis not present

## 2020-05-10 DIAGNOSIS — Z79899 Other long term (current) drug therapy: Secondary | ICD-10-CM | POA: Diagnosis not present

## 2020-05-10 DIAGNOSIS — Z5181 Encounter for therapeutic drug level monitoring: Secondary | ICD-10-CM | POA: Diagnosis not present

## 2020-05-10 DIAGNOSIS — E1065 Type 1 diabetes mellitus with hyperglycemia: Secondary | ICD-10-CM | POA: Diagnosis not present

## 2020-05-12 DIAGNOSIS — F339 Major depressive disorder, recurrent, unspecified: Secondary | ICD-10-CM | POA: Diagnosis not present

## 2020-05-12 DIAGNOSIS — E1165 Type 2 diabetes mellitus with hyperglycemia: Secondary | ICD-10-CM | POA: Diagnosis not present

## 2020-05-12 DIAGNOSIS — M189 Osteoarthritis of first carpometacarpal joint, unspecified: Secondary | ICD-10-CM | POA: Diagnosis not present

## 2020-05-12 DIAGNOSIS — E7800 Pure hypercholesterolemia, unspecified: Secondary | ICD-10-CM | POA: Diagnosis not present

## 2020-05-12 DIAGNOSIS — K219 Gastro-esophageal reflux disease without esophagitis: Secondary | ICD-10-CM | POA: Diagnosis not present

## 2020-05-12 DIAGNOSIS — E1169 Type 2 diabetes mellitus with other specified complication: Secondary | ICD-10-CM | POA: Diagnosis not present

## 2020-05-12 DIAGNOSIS — I10 Essential (primary) hypertension: Secondary | ICD-10-CM | POA: Diagnosis not present

## 2020-05-12 DIAGNOSIS — E119 Type 2 diabetes mellitus without complications: Secondary | ICD-10-CM | POA: Diagnosis not present

## 2020-05-14 MED FILL — CARVEDILOL 25 MG TABLET: 25 | 90 days supply | Qty: 90 | Fill #3

## 2020-05-14 MED FILL — ULTCARE INS SYR 1 ML 31GX5/: 31G X 5/16" | 30 days supply | Qty: 100 | Fill #2

## 2020-05-14 MED FILL — LOSARTAN POTASSIUM 50 MG TA: 50 | 90 days supply | Qty: 90 | Fill #3

## 2020-05-16 ENCOUNTER — Other Ambulatory Visit (HOSPITAL_BASED_OUTPATIENT_CLINIC_OR_DEPARTMENT_OTHER): Payer: Self-pay | Admitting: Chiropractic Medicine

## 2020-05-17 MED FILL — HYDROmorphone HCL 8 MG TABS: 8 | 30 days supply | Qty: 210 | Fill #0

## 2020-05-24 DIAGNOSIS — E109 Type 1 diabetes mellitus without complications: Secondary | ICD-10-CM | POA: Diagnosis not present

## 2020-06-01 ENCOUNTER — Other Ambulatory Visit: Payer: Self-pay | Admitting: Gastroenterology

## 2020-06-01 DIAGNOSIS — R1310 Dysphagia, unspecified: Secondary | ICD-10-CM

## 2020-06-01 DIAGNOSIS — K219 Gastro-esophageal reflux disease without esophagitis: Secondary | ICD-10-CM | POA: Diagnosis not present

## 2020-06-01 DIAGNOSIS — R112 Nausea with vomiting, unspecified: Secondary | ICD-10-CM | POA: Diagnosis not present

## 2020-06-01 DIAGNOSIS — R634 Abnormal weight loss: Secondary | ICD-10-CM | POA: Diagnosis not present

## 2020-06-01 DIAGNOSIS — R198 Other specified symptoms and signs involving the digestive system and abdomen: Secondary | ICD-10-CM | POA: Diagnosis not present

## 2020-06-05 MED FILL — Insulin Degludec Soln Pen-Injector 100 Unit/ML: SUBCUTANEOUS | 30 days supply | Qty: 3 | Fill #0 | Status: AC

## 2020-06-07 ENCOUNTER — Other Ambulatory Visit (HOSPITAL_BASED_OUTPATIENT_CLINIC_OR_DEPARTMENT_OTHER): Payer: Self-pay

## 2020-06-10 DIAGNOSIS — E1065 Type 1 diabetes mellitus with hyperglycemia: Secondary | ICD-10-CM | POA: Diagnosis not present

## 2020-06-16 ENCOUNTER — Ambulatory Visit
Admission: RE | Admit: 2020-06-16 | Discharge: 2020-06-16 | Disposition: A | Discharge status: Home / Self Care | Payer: HMO | Source: Ambulatory Visit | Attending: Gastroenterology | Admitting: Gastroenterology

## 2020-06-16 DIAGNOSIS — R1310 Dysphagia, unspecified: Secondary | ICD-10-CM

## 2020-06-18 ENCOUNTER — Other Ambulatory Visit (HOSPITAL_BASED_OUTPATIENT_CLINIC_OR_DEPARTMENT_OTHER): Payer: Self-pay

## 2020-06-18 DIAGNOSIS — E1165 Type 2 diabetes mellitus with hyperglycemia: Secondary | ICD-10-CM | POA: Diagnosis not present

## 2020-06-18 DIAGNOSIS — M189 Osteoarthritis of first carpometacarpal joint, unspecified: Secondary | ICD-10-CM | POA: Diagnosis not present

## 2020-06-18 DIAGNOSIS — E1169 Type 2 diabetes mellitus with other specified complication: Secondary | ICD-10-CM | POA: Diagnosis not present

## 2020-06-18 DIAGNOSIS — K219 Gastro-esophageal reflux disease without esophagitis: Secondary | ICD-10-CM | POA: Diagnosis not present

## 2020-06-18 DIAGNOSIS — I10 Essential (primary) hypertension: Secondary | ICD-10-CM | POA: Diagnosis not present

## 2020-06-18 DIAGNOSIS — E119 Type 2 diabetes mellitus without complications: Secondary | ICD-10-CM | POA: Diagnosis not present

## 2020-06-18 DIAGNOSIS — E7800 Pure hypercholesterolemia, unspecified: Secondary | ICD-10-CM | POA: Diagnosis not present

## 2020-06-18 DIAGNOSIS — F339 Major depressive disorder, recurrent, unspecified: Secondary | ICD-10-CM | POA: Diagnosis not present

## 2020-06-18 MED ORDER — HYDROMORPHONE HCL 8 MG PO TABS
ORAL_TABLET | ORAL | 0 refills | Status: DC
  Filled 2020-06-18: qty 210, 30d supply, fill #0

## 2020-06-22 ENCOUNTER — Other Ambulatory Visit (HOSPITAL_BASED_OUTPATIENT_CLINIC_OR_DEPARTMENT_OTHER): Payer: Self-pay

## 2020-06-22 MED FILL — Insulin Syringe/Needle U-100 1 ML 31 x 5/16": 90 days supply | Qty: 200 | Fill #0 | Status: AC

## 2020-06-22 MED FILL — Pravastatin Sodium Tab 20 MG: ORAL | 90 days supply | Qty: 26 | Fill #0 | Status: AC

## 2020-06-23 ENCOUNTER — Other Ambulatory Visit (HOSPITAL_BASED_OUTPATIENT_CLINIC_OR_DEPARTMENT_OTHER): Payer: Self-pay

## 2020-06-24 ENCOUNTER — Other Ambulatory Visit (HOSPITAL_BASED_OUTPATIENT_CLINIC_OR_DEPARTMENT_OTHER): Payer: Self-pay

## 2020-06-28 ENCOUNTER — Other Ambulatory Visit (HOSPITAL_BASED_OUTPATIENT_CLINIC_OR_DEPARTMENT_OTHER): Payer: Self-pay

## 2020-06-28 MED ORDER — TRESIBA FLEXTOUCH 100 UNIT/ML ~~LOC~~ SOPN
PEN_INJECTOR | SUBCUTANEOUS | 6 refills | Status: DC
Start: 2020-06-28 — End: 2020-08-18
  Filled 2020-06-28 – 2020-07-20 (×3): qty 3, 21d supply, fill #0

## 2020-06-28 MED FILL — Insulin Degludec Soln Pen-Injector 100 Unit/ML: SUBCUTANEOUS | 30 days supply | Qty: 3 | Fill #1 | Status: CN

## 2020-06-30 ENCOUNTER — Other Ambulatory Visit (HOSPITAL_BASED_OUTPATIENT_CLINIC_OR_DEPARTMENT_OTHER): Payer: Self-pay

## 2020-06-30 MED FILL — Insulin Degludec Soln Pen-Injector 100 Unit/ML: SUBCUTANEOUS | 30 days supply | Qty: 3 | Fill #1 | Status: AC

## 2020-07-01 ENCOUNTER — Other Ambulatory Visit (HOSPITAL_BASED_OUTPATIENT_CLINIC_OR_DEPARTMENT_OTHER): Payer: Self-pay

## 2020-07-01 DIAGNOSIS — M25531 Pain in right wrist: Secondary | ICD-10-CM | POA: Diagnosis not present

## 2020-07-01 DIAGNOSIS — K219 Gastro-esophageal reflux disease without esophagitis: Secondary | ICD-10-CM | POA: Diagnosis not present

## 2020-07-01 DIAGNOSIS — F339 Major depressive disorder, recurrent, unspecified: Secondary | ICD-10-CM | POA: Diagnosis not present

## 2020-07-01 DIAGNOSIS — E7800 Pure hypercholesterolemia, unspecified: Secondary | ICD-10-CM | POA: Diagnosis not present

## 2020-07-01 DIAGNOSIS — I10 Essential (primary) hypertension: Secondary | ICD-10-CM | POA: Diagnosis not present

## 2020-07-01 MED ORDER — CARVEDILOL 25 MG PO TABS
ORAL_TABLET | ORAL | 4 refills | Status: DC
  Filled 2020-07-01 – 2020-08-25 (×2): qty 90, 90d supply, fill #0
  Filled 2020-12-08: qty 90, 90d supply, fill #1
  Filled 2021-03-07: qty 90, 90d supply, fill #2
  Filled 2021-06-03: qty 90, 90d supply, fill #3

## 2020-07-01 MED ORDER — MEGESTROL ACETATE 40 MG PO TABS
ORAL_TABLET | ORAL | 2 refills | Status: DC
  Filled 2020-07-01: qty 60, 30d supply, fill #0
  Filled 2020-09-02: qty 60, 30d supply, fill #1
  Filled 2020-11-01: qty 60, 30d supply, fill #2

## 2020-07-01 MED ORDER — LOSARTAN POTASSIUM 50 MG PO TABS
ORAL_TABLET | ORAL | 4 refills | Status: DC
  Filled 2020-07-01 – 2020-08-25 (×2): qty 90, 90d supply, fill #0
  Filled 2020-12-08: qty 90, 90d supply, fill #1
  Filled 2021-03-07: qty 90, 90d supply, fill #2
  Filled 2021-06-22: qty 90, 90d supply, fill #3

## 2020-07-01 MED ORDER — LANSOPRAZOLE 30 MG PO CPDR
DELAYED_RELEASE_CAPSULE | ORAL | 4 refills | Status: DC
  Filled 2020-07-01 – 2020-07-29 (×2): qty 90, 90d supply, fill #0
  Filled 2020-10-26: qty 90, 90d supply, fill #1
  Filled 2021-01-24: qty 90, 90d supply, fill #2
  Filled 2021-04-13: qty 90, 90d supply, fill #3
  Filled 2021-06-28: qty 90, 90d supply, fill #4

## 2020-07-01 MED ORDER — PRAVASTATIN SODIUM 20 MG PO TABS
ORAL_TABLET | ORAL | 4 refills | Status: DC
  Filled 2020-07-01: qty 40, 90d supply, fill #0
  Filled 2020-10-04: qty 24, 84d supply, fill #0
  Filled 2021-01-11: qty 24, 84d supply, fill #1
  Filled 2021-04-02: qty 24, 84d supply, fill #2
  Filled 2021-06-28: qty 24, 84d supply, fill #3

## 2020-07-05 DIAGNOSIS — I10 Essential (primary) hypertension: Secondary | ICD-10-CM | POA: Diagnosis not present

## 2020-07-05 DIAGNOSIS — K219 Gastro-esophageal reflux disease without esophagitis: Secondary | ICD-10-CM | POA: Diagnosis not present

## 2020-07-05 DIAGNOSIS — E7800 Pure hypercholesterolemia, unspecified: Secondary | ICD-10-CM | POA: Diagnosis not present

## 2020-07-05 DIAGNOSIS — M189 Osteoarthritis of first carpometacarpal joint, unspecified: Secondary | ICD-10-CM | POA: Diagnosis not present

## 2020-07-05 DIAGNOSIS — E119 Type 2 diabetes mellitus without complications: Secondary | ICD-10-CM | POA: Diagnosis not present

## 2020-07-05 DIAGNOSIS — F339 Major depressive disorder, recurrent, unspecified: Secondary | ICD-10-CM | POA: Diagnosis not present

## 2020-07-05 DIAGNOSIS — E1169 Type 2 diabetes mellitus with other specified complication: Secondary | ICD-10-CM | POA: Diagnosis not present

## 2020-07-05 DIAGNOSIS — E1165 Type 2 diabetes mellitus with hyperglycemia: Secondary | ICD-10-CM | POA: Diagnosis not present

## 2020-07-13 DIAGNOSIS — R1310 Dysphagia, unspecified: Secondary | ICD-10-CM | POA: Diagnosis not present

## 2020-07-13 DIAGNOSIS — K2930 Chronic superficial gastritis without bleeding: Secondary | ICD-10-CM | POA: Diagnosis not present

## 2020-07-13 DIAGNOSIS — K269 Duodenal ulcer, unspecified as acute or chronic, without hemorrhage or perforation: Secondary | ICD-10-CM | POA: Diagnosis not present

## 2020-07-13 DIAGNOSIS — K449 Diaphragmatic hernia without obstruction or gangrene: Secondary | ICD-10-CM | POA: Diagnosis not present

## 2020-07-13 DIAGNOSIS — K222 Esophageal obstruction: Secondary | ICD-10-CM | POA: Diagnosis not present

## 2020-07-13 DIAGNOSIS — K2100 Gastro-esophageal reflux disease with esophagitis, without bleeding: Secondary | ICD-10-CM | POA: Diagnosis not present

## 2020-07-14 DIAGNOSIS — M7542 Impingement syndrome of left shoulder: Secondary | ICD-10-CM | POA: Diagnosis not present

## 2020-07-14 DIAGNOSIS — E1065 Type 1 diabetes mellitus with hyperglycemia: Secondary | ICD-10-CM | POA: Diagnosis not present

## 2020-07-14 DIAGNOSIS — Z0000 Encounter for general adult medical examination without abnormal findings: Secondary | ICD-10-CM | POA: Diagnosis not present

## 2020-07-14 DIAGNOSIS — Z1389 Encounter for screening for other disorder: Secondary | ICD-10-CM | POA: Diagnosis not present

## 2020-07-15 ENCOUNTER — Other Ambulatory Visit (HOSPITAL_BASED_OUTPATIENT_CLINIC_OR_DEPARTMENT_OTHER): Payer: Self-pay

## 2020-07-16 ENCOUNTER — Other Ambulatory Visit (HOSPITAL_BASED_OUTPATIENT_CLINIC_OR_DEPARTMENT_OTHER): Payer: Self-pay

## 2020-07-19 MED FILL — Insulin Degludec Soln Pen-Injector 100 Unit/ML: SUBCUTANEOUS | 30 days supply | Qty: 3 | Fill #2 | Status: CN

## 2020-07-20 ENCOUNTER — Other Ambulatory Visit (HOSPITAL_BASED_OUTPATIENT_CLINIC_OR_DEPARTMENT_OTHER): Payer: Self-pay

## 2020-07-20 DIAGNOSIS — K2930 Chronic superficial gastritis without bleeding: Secondary | ICD-10-CM | POA: Diagnosis not present

## 2020-07-20 MED ORDER — INSULIN ASPART 100 UNIT/ML IJ SOLN
INTRAMUSCULAR | 6 refills | Status: DC
  Filled 2020-07-20: qty 30, 30d supply, fill #0

## 2020-07-20 MED ORDER — HYDROMORPHONE HCL 8 MG PO TABS
ORAL_TABLET | ORAL | 0 refills | Status: DC
  Filled 2020-07-20: qty 210, 30d supply, fill #0

## 2020-07-22 ENCOUNTER — Other Ambulatory Visit (HOSPITAL_BASED_OUTPATIENT_CLINIC_OR_DEPARTMENT_OTHER): Payer: Self-pay

## 2020-07-22 MED ORDER — TRESIBA FLEXTOUCH 100 UNIT/ML ~~LOC~~ SOPN
PEN_INJECTOR | SUBCUTANEOUS | 6 refills | Status: DC
  Filled 2020-07-22: qty 15, 90d supply, fill #0

## 2020-07-22 MED FILL — Insulin Degludec Soln Pen-Injector 100 Unit/ML: SUBCUTANEOUS | 30 days supply | Qty: 3 | Fill #2 | Status: CN

## 2020-07-27 DIAGNOSIS — G894 Chronic pain syndrome: Secondary | ICD-10-CM | POA: Diagnosis not present

## 2020-07-28 ENCOUNTER — Other Ambulatory Visit (HOSPITAL_BASED_OUTPATIENT_CLINIC_OR_DEPARTMENT_OTHER): Payer: Self-pay

## 2020-07-29 ENCOUNTER — Other Ambulatory Visit (HOSPITAL_BASED_OUTPATIENT_CLINIC_OR_DEPARTMENT_OTHER): Payer: Self-pay

## 2020-08-02 ENCOUNTER — Other Ambulatory Visit (HOSPITAL_BASED_OUTPATIENT_CLINIC_OR_DEPARTMENT_OTHER): Payer: Self-pay

## 2020-08-02 MED ORDER — POLYETHYLENE GLYCOL 3350 17 GM/SCOOP PO POWD
ORAL | 5 refills | Status: DC
  Filled 2020-08-02: qty 510, 30d supply, fill #0

## 2020-08-03 DIAGNOSIS — G894 Chronic pain syndrome: Secondary | ICD-10-CM | POA: Diagnosis not present

## 2020-08-06 ENCOUNTER — Encounter (HOSPITAL_BASED_OUTPATIENT_CLINIC_OR_DEPARTMENT_OTHER): Payer: Self-pay

## 2020-08-06 ENCOUNTER — Emergency Department (HOSPITAL_BASED_OUTPATIENT_CLINIC_OR_DEPARTMENT_OTHER): Payer: HMO

## 2020-08-06 ENCOUNTER — Other Ambulatory Visit: Payer: Self-pay

## 2020-08-06 ENCOUNTER — Emergency Department (HOSPITAL_BASED_OUTPATIENT_CLINIC_OR_DEPARTMENT_OTHER)
Admission: EM | Admit: 2020-08-06 | Discharge: 2020-08-06 | Disposition: A | Discharge status: Home / Self Care | Payer: HMO | Attending: Emergency Medicine | Admitting: Emergency Medicine

## 2020-08-06 DIAGNOSIS — I80201 Phlebitis and thrombophlebitis of unspecified deep vessels of right lower extremity: Secondary | ICD-10-CM | POA: Diagnosis not present

## 2020-08-06 DIAGNOSIS — Z7901 Long term (current) use of anticoagulants: Secondary | ICD-10-CM | POA: Diagnosis not present

## 2020-08-06 DIAGNOSIS — Z87891 Personal history of nicotine dependence: Secondary | ICD-10-CM | POA: Diagnosis not present

## 2020-08-06 DIAGNOSIS — Z79899 Other long term (current) drug therapy: Secondary | ICD-10-CM | POA: Diagnosis not present

## 2020-08-06 DIAGNOSIS — Z96653 Presence of artificial knee joint, bilateral: Secondary | ICD-10-CM | POA: Insufficient documentation

## 2020-08-06 DIAGNOSIS — Z794 Long term (current) use of insulin: Secondary | ICD-10-CM | POA: Insufficient documentation

## 2020-08-06 DIAGNOSIS — I82441 Acute embolism and thrombosis of right tibial vein: Secondary | ICD-10-CM | POA: Diagnosis not present

## 2020-08-06 DIAGNOSIS — Z7982 Long term (current) use of aspirin: Secondary | ICD-10-CM | POA: Diagnosis not present

## 2020-08-06 DIAGNOSIS — M7989 Other specified soft tissue disorders: Secondary | ICD-10-CM | POA: Diagnosis not present

## 2020-08-06 DIAGNOSIS — R300 Dysuria: Secondary | ICD-10-CM

## 2020-08-06 DIAGNOSIS — R001 Bradycardia, unspecified: Secondary | ICD-10-CM | POA: Insufficient documentation

## 2020-08-06 DIAGNOSIS — I10 Essential (primary) hypertension: Secondary | ICD-10-CM | POA: Insufficient documentation

## 2020-08-06 DIAGNOSIS — I824Z1 Acute embolism and thrombosis of unspecified deep veins of right distal lower extremity: Secondary | ICD-10-CM

## 2020-08-06 DIAGNOSIS — Z8507 Personal history of malignant neoplasm of pancreas: Secondary | ICD-10-CM | POA: Diagnosis not present

## 2020-08-06 DIAGNOSIS — I82401 Acute embolism and thrombosis of unspecified deep veins of right lower extremity: Secondary | ICD-10-CM | POA: Diagnosis not present

## 2020-08-06 DIAGNOSIS — E119 Type 2 diabetes mellitus without complications: Secondary | ICD-10-CM | POA: Insufficient documentation

## 2020-08-06 DIAGNOSIS — M79651 Pain in right thigh: Secondary | ICD-10-CM | POA: Diagnosis not present

## 2020-08-06 DIAGNOSIS — I82811 Embolism and thrombosis of superficial veins of right lower extremities: Secondary | ICD-10-CM | POA: Diagnosis not present

## 2020-08-06 DIAGNOSIS — I809 Phlebitis and thrombophlebitis of unspecified site: Secondary | ICD-10-CM

## 2020-08-06 LAB — COMPREHENSIVE METABOLIC PANEL
ALT: 17 U/L (ref 0–44)
AST: 15 U/L (ref 15–41)
Albumin: 3.7 g/dL (ref 3.5–5.0)
Alkaline Phosphatase: 83 U/L (ref 38–126)
Anion gap: 5 (ref 5–15)
BUN: 22 mg/dL (ref 8–23)
CO2: 23 mmol/L (ref 22–32)
Calcium: 10.5 mg/dL — ABNORMAL HIGH (ref 8.9–10.3)
Chloride: 105 mmol/L (ref 98–111)
Creatinine, Ser: 1.07 mg/dL (ref 0.61–1.24)
GFR, Estimated: >60 mL/min (ref >60)
Glucose, Bld: 230 mg/dL — ABNORMAL HIGH (ref 70–99)
Potassium: 5 mmol/L (ref 3.5–5.1)
Sodium: 133 mmol/L — ABNORMAL LOW (ref 135–145)
Total Bilirubin: 0.4 mg/dL (ref 0.3–1.2)
Total Protein: 7.1 g/dL (ref 6.5–8.1)

## 2020-08-06 LAB — CBC WITH DIFFERENTIAL/PLATELET
Abs Immature Granulocytes: 0.02 10*3/uL (ref 0.00–0.07)
Basophils Absolute: 0.1 10*3/uL (ref 0.0–0.1)
Basophils Relative: 2 %
Eosinophils Absolute: 0.4 10*3/uL (ref 0.0–0.5)
Eosinophils Relative: 6 %
HCT: 37.6 % — ABNORMAL LOW (ref 39.0–52.0)
Hemoglobin: 12.9 g/dL — ABNORMAL LOW (ref 13.0–17.0)
Immature Granulocytes: 0 %
Lymphocytes Relative: 42 %
Lymphs Abs: 3.2 10*3/uL (ref 0.7–4.0)
MCH: 29.6 pg (ref 26.0–34.0)
MCHC: 34.3 g/dL (ref 30.0–36.0)
MCV: 86.2 fL (ref 80.0–100.0)
Monocytes Absolute: 1.1 10*3/uL — ABNORMAL HIGH (ref 0.1–1.0)
Monocytes Relative: 15 %
Neutro Abs: 2.5 10*3/uL (ref 1.7–7.7)
Neutrophils Relative %: 35 %
Platelets: 300 10*3/uL (ref 150–400)
RBC: 4.36 MIL/uL (ref 4.22–5.81)
RDW: 13.5 % (ref 11.5–15.5)
WBC: 7.3 10*3/uL (ref 4.0–10.5)
nRBC: 0 % (ref 0.0–0.2)

## 2020-08-06 LAB — URINALYSIS, ROUTINE W REFLEX MICROSCOPIC
Bilirubin Urine: NEGATIVE
Glucose, UA: >=500 mg/dL — AB
Ketones, ur: NEGATIVE mg/dL
Leukocytes,Ua: NEGATIVE
Nitrite: NEGATIVE
Protein, ur: NEGATIVE mg/dL
Specific Gravity, Urine: 1.015 (ref 1.005–1.030)
pH: 7 (ref 5.0–8.0)

## 2020-08-06 LAB — URINALYSIS, MICROSCOPIC (REFLEX): RBC / HPF: >50 RBC/hpf (ref 0–5)

## 2020-08-06 LAB — CBG MONITORING, ED: Glucose-Capillary: 198 mg/dL — ABNORMAL HIGH (ref 70–99)

## 2020-08-06 MED ORDER — APIXABAN (ELIQUIS) VTE STARTER PACK (10MG AND 5MG): ORAL_TABLET | ORAL | 0 refills | Status: DC

## 2020-08-06 MED ORDER — APIXABAN (ELIQUIS) EDUCATION KIT FOR DVT/PE PATIENTS: PACK | Freq: Once | Status: AC

## 2020-08-06 NOTE — ED Triage Notes (Signed)
Pt c/o swelling/knot right LE x 2 weeks-sent from [facility] PCP for possible blood clot-pt NAD-to triage in w/c

## 2020-08-06 NOTE — ED Provider Notes (Signed)
[facility] Provider Note   CSN: [id number] Arrival date & time: 08/06/20  1954     History Chief Complaint  Patient presents with   Leg Swelling    Derek [name] is a 75 y.o. male.  75 year old male presents with complaint of pain to his medial right thigh extending to his medial knee.  Pain started about 2 weeks ago after he returned from a long distance motorcycle ride.  Denies injury to the leg otherwise, no history of prior PE or DVT, takes a baby aspirin, is not otherwise anticoagulated.  Denies shortness of breath or chest pain.  No other complaints or concerns.      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" no pulmonary md, sees dr [name]   Factor V Leiden mutation ([place])    "never had blood clots"   GERD (gastroesophageal reflux disease)    Hiatal hernia    History of kidney stones    multiple kidney stones-not a problem at present   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, pT1 pNX) no further treatment   History of panic attacks    History 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   Foreign body of abdominal wall 09/04/2016   Ventral incisional hernia 11/12/2015   Incisional hernia 11/10/2015   Infected pancreatic pseudocyst 05/13/2015   Pleural effusion on left    Lactose intolerance in adult 04/04/2015   Protein-calorie malnutrition, moderate ([place]) 04/04/2015   Pleural effusion, left    Debility    Benign essential HTN    Leukocytosis    Thrombocytosis    Intra-abdominal abscess ([place])    Poorly controlled type 2 diabetes mellitus ([place])    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 02/24/2015   OA (osteoarthritis) of knee 06/02/2013   Anal fissure 02/04/2013   Meniere's disease    GERD (gastroesophageal reflux disease)    Kidney stone  Obstructive sleep apnea    Morbid obesity ([facility])    Synovitis of knee 03/01/2012   Chronic cough 11/03/2011   COSTOCHONDRITIS, LEFT 10/08/2009   RIB PAIN, LEFT SIDED 10/08/2009    Past Surgical History:  Procedure Laterality Date   ANAL FISTULECTOMY N/A 04/01/2013   Procedure: EXAM UNDER ANESTHESIA WITH ANAL FISSURectomy, sphincterotomy and internal hemorrhoidectomy;  Surgeon: Douglas A Blackman, MD;  Location: [facility];  Service: General;  Laterality: N/A;   APPLICATION OF WOUND VAC N/A 05/16/2015   Procedure: APPLICATION OF WOUND VAC;  Surgeon: Todd Gerkin, MD;  Location: [facility];  Service: General;  Laterality: N/A;   APPLICATION OF WOUND VAC N/A 05/20/2015   Procedure: EXHANGE OF ABDOMINAL  WOUND VAC DRESSING;  Surgeon: Todd Gerkin, MD;   Location: [facility];  Service: General;  Laterality: N/A;   COLONOSCOPY     CYSTOSCOPY WITH INSERTION OF UROLIFT N/A 10/12/2016   Procedure: CYSTOSCOPY WITH INSERTION OF UROLIFT;  Surgeon: Dahlstedt, Stephen, MD;  Location: [facility];  Service: Urology;  Laterality: N/A;   EUS N/A 12/31/2014   Procedure: UPPER ENDOSCOPIC ULTRASOUND (EUS) LINEAR;  Surgeon: Daniel P Jacobs, MD;  Location: [facility];  Service: Endoscopy;  Laterality: N/A;   HARDWARE REMOVAL Left 2013   left ankle   HEMORRHOIDECTOMY WITH HEMORRHOID BANDING     INCISION AND DRAINAGE ABSCESS N/A 05/14/2015   Procedure: DRAINAGE OF INFECTED PANCREATIC PSEUDOCYST;  Surgeon: Todd Gerkin, MD;  Location: [facility];  Service: General;  Laterality: N/A;   INCISION AND DRAINAGE OF WOUND N/A 05/16/2015   Procedure: IRRIGATION AND DEBRIDEMENT WOUND;  Surgeon: Todd Gerkin, MD;  Location: [facility];  Service: General;  Laterality: N/A;   INCISIONAL HERNIA REPAIR N/A 11/12/2015   Procedure: REPAIR INCISIONAL HERNIA WITH MESH;  Surgeon: Todd Gerkin, MD;  Location: [facility];  Service: General;  Laterality: N/A;   INGUINAL HERNIA REPAIR Right 1974;  1982   INSERTION OF MESH N/A 11/12/2015   Procedure: INSERTION OF MESH;  Surgeon: Todd Gerkin, MD;  Location: [facility];  Service: General;  Laterality: N/A;   IR GENERIC HISTORICAL  04/20/2015   IR RADIOLOGIST EVAL & MGMT 04/20/2015 [facility] INTERV RAD   KNEE ARTHROSCOPY Bilateral right 08-08-2000;  left 11-23-2000   meniscal repair and chondroplasty's   KNEE ARTHROSCOPY  03/01/2012   Procedure: ARTHROSCOPY KNEE;  Surgeon: Frank V Aluisio, MD;  Location: [facility];  Service: Orthopedics;  Laterality: Left;  WITH SYNOVECTOMY    LAPAROTOMY N/A 05/14/2015   Procedure: EXPLORATORY LAPAROTOMY;  Surgeon: Todd Gerkin, MD;  Location: [facility];  Service: General;  Laterality: N/A;   MASS EXCISION N/A 12/24/2019   Procedure: EXCISION LIP CARCINOMA;  Surgeon: Teoh, Su, MD;  Location: [facility];  Service:  ENT;  Laterality: N/A;   ORIF LEFT ANKLE FX  08/20/2009   distal tib-fib and malleolus   ROTATOR CUFF REPAIR Bilateral right 1994; left 1993, left 1993   TOTAL KNEE ARTHROPLASTY Right 06/02/2013   Procedure: RIGHT TOTAL KNEE ARTHROPLASTY;  Surgeon: Frank V Aluisio, MD;  Location: [facility];  Service: Orthopedics;  Laterality: Right;   TOTAL KNEE ARTHROPLASTY Left 01-31-2008   dr aluisio   TOTAL KNEE REVISION Right 01/23/2018   Procedure: right knee polyethylene revision;  Surgeon: Aluisio, Frank, MD;  Location: [facility];  Service: Orthopedics;  Laterality: Right;  90min   TRANSURETHRAL RESECTION OF PROSTATE N/A 04/21/2019   Procedure: TRANSURETHRAL RESECTION OF THE PROSTATE (TURP);  Surgeon: Dahlstedt, Stephen, MD;    Location: [facility];  Service: Urology;  Laterality: N/A;   UMBILICAL HERNIA REPAIR  08-11-1999   dr Ninfa Linden   UPPER GASTROINTESTINAL ENDOSCOPY  sept 2016   WOUND EXPLORATION N/A 09/21/2016   Procedure: WOUND EXPLORATION AND DEBRIDEMENT ABDOMINAL WALL;  Surgeon: Armandina Gemma, MD;  Location: [facility];  Service: General;  Laterality: N/A;       Family History  Problem Relation Age of Onset   Bone cancer Father        Jaw   Factor V Leiden deficiency Daughter    Diabetes Maternal Grandmother    Heart disease Paternal Uncle    Colon cancer Neg Hx    Esophageal cancer Neg Hx    Stomach cancer Neg Hx    Rectal cancer Neg Hx     Social History   Tobacco Use   Smoking status: Former    Packs/day: 4.00    Years: 17.00    Pack years: 68.00    Types: Cigarettes    Quit date: 02/21/1975    Years since quitting: 45.4   Smokeless tobacco: Never  Vaping Use   Vaping Use: Never used  Substance Use Topics   Alcohol use: No    Alcohol/week: 0.0 standard drinks   Drug use: No    Home Medications Prior to Admission medications   Medication Sig Start Date End Date Taking? Authorizing Provider  APIXABAN (ELIQUIS) VTE STARTER PACK (10MG AND 5MG) Take as directed on  package: start with two-73m tablets twice daily for 7 days. On day 8, switch to one-564mtablet twice daily. 08/06/20  Yes MuTacy LearnPA-C  amoxicillin (AMOXIL) 875 MG tablet TAKE 1 TABLET (875 MG TOTAL) BY MOUTH 2 (TWO) TIMES DAILY FOR 5 DAYS. 12/24/19 12/23/20  TeLeta BaptistMD  aspirin EC 81 MG tablet Take 81 mg by mouth at bedtime. Swallow whole.    [provider]  carvedilol (COREG) 25 MG tablet Take 0.5 tablets (12.5 mg total) by mouth 2 (two) times daily with a meal. Patient taking differently: Take 25 mg by mouth every evening.  04/06/15   GrMichael BostonMD  carvedilol (COREG) 25 MG tablet TAKE 1 TABLET BY MOUTH ONCE DAILY 07/01/19 06/30/20  RoLawerance CruelMD  carvedilol (COREG) 25 MG tablet Take one tablet by mouth once daily in the evening. 07/01/20     celecoxib (CELEBREX) 200 MG capsule TAKE 1 CAPSULE BY MOUTH 2 TIMES DAILY WITH FOOD 04/26/20 04/26/21  RoLawerance CruelMD  clindamycin (CLEOCIN) 300 MG capsule TAKE 1 CAPSULE BY MOUTH EVERY 8 HOURS UNTIL FINISHED 03/26/20 03/26/21    COVID-19 mRNA vaccine, Pfizer, 30 MCG/0.3ML injection INJECT AS DIRECTED 01/05/20 01/04/21  SnCarlyle BasquesMD  HYDROmorphone (DILAUDID) 4 MG tablet Take 8 mg by mouth 4 (four) times daily as needed for moderate pain.     [provider]  HYDROmorphone (DILAUDID) 8 MG tablet TAKE 1 TABLET BY MOUTH 7 TIMES A DAY 05/16/20 11/12/20  KnLevy PupaPA-C  HYDROmorphone (DILAUDID) 8 MG tablet TAKE 1 TABLET BY MOUTH 7 TIMES A DAY 04/16/20 10/13/20  KnLevy PupaPA-C  HYDROmorphone (DILAUDID) 8 MG tablet TAKE 1 TABLET BY MOUTH 7 TIMES DAILY 03/18/20 09/14/20  KnLevy PupaPA-C  HYDROmorphone (DILAUDID) 8 MG tablet TAKE 1 TABLET BY MOUTH UP TO 7 TIMES DAILY AS NEEDED 02/11/20 08/09/20  KnLevy PupaPA-C  HYDROmorphone (DILAUDID) 8 MG tablet Take 1 tablet by mouth 7 times a day as needed 07/20/20  insulin aspart (NOVOLOG) 100 UNIT/ML injection Inject 0-9 Units into the skin 3 (three) times daily before  meals. Patient taking differently: Inject 5-30 Units into the skin 3 (three) times daily before meals. Per sliding scale 03/03/15   Gerkin, Todd, MD  insulin aspart (NOVOLOG) 100 UNIT/ML injection USE 100U/DAY SUBCUTANEOUS VIA PUMP 30 DAY(S) 12/23/19 12/22/20  Balan, Bindubal, MD  insulin aspart (NOVOLOG) 100 UNIT/ML injection Use 100 units daily under the skin via pump 12/23/19     insulin degludec (TRESIBA FLEXTOUCH) 100 UNIT/ML FlexTouch Pen Inject 14 units under the skin once a day 06/28/20     insulin degludec (TRESIBA FLEXTOUCH) 100 UNIT/ML FlexTouch Pen Inject 14 units Subcutaneous Once a day 30 days 07/22/20     insulin degludec (TRESIBA) 100 UNIT/ML FlexTouch Pen INJECT 10 UNITS UNDER THE SKIN ONCE A DAY 03/31/20 03/31/21  Balan, Bindubal, MD  Insulin Disposable Pump (OMNIPOD DASH 5 PACK PODS) MISC Inject into the skin continuous. Change every three days 12/15/19   [provider]  insulin NPH Human (HUMULIN N,NOVOLIN N) 100 UNIT/ML injection Inject 25-50 Units into the skin See admin instructions. Inject 50 units SQ in the morning, inject 40  units SQ at lunch and inject 40 units SQ at bedtime    [provider]  Insulin Syringe-Needle U-100 31G X 5/16" 1 ML MISC USE AS DIRECTED TO INJECT 01/19/20 01/18/21  Balan, Bindubal, MD  lansoprazole (PREVACID) 30 MG capsule Take 30 mg by mouth daily as needed (for acid reflux).     [provider]  lansoprazole (PREVACID) 30 MG capsule TAKE 1 CAPSULE BY MOUTH ONCE DAILY 04/30/20 04/30/21  Ross, Charles Alan, MD  lansoprazole (PREVACID) 30 MG capsule TAKE 1 CAPSULE BY MOUTH ONCE DAILY 10/24/19 10/23/20  Ross, Charles Alan, MD  lansoprazole (PREVACID) 30 MG capsule Take one capsule by mouth once daily. 07/01/20     losartan (COZAAR) 50 MG tablet Take 50 mg by mouth at bedtime.     [provider]  losartan (COZAAR) 50 MG tablet TAKE 1 TABLET BY MOUTH ONCE DAILY 07/01/19 06/30/20  Ross, Charles Alan, MD  losartan (COZAAR) 50 MG tablet  Take one tablet by mouth every evening. 07/01/20     magnesium oxide (MAG-OX) 400 MG tablet Take 800 mg by mouth at bedtime. Triple complex    [provider]  megestrol (MEGACE) 40 MG tablet Take one tablet by mouth twice daily. 07/01/20     polyethylene glycol powder (MIRALAX) 17 GM/SCOOP powder Mix 1 scoop (17g) in 8 ounces of water or juice once daily 08/02/20     pravastatin (PRAVACHOL) 20 MG tablet Take 20 mg by mouth See admin instructions. Every Monday and Friday    [provider]  pravastatin (PRAVACHOL) 20 MG tablet Take one tablet by mouth twice weekly. 07/01/20     Probiotic Product (PROBIOTIC DAILY PO) Take 1 tablet by mouth at bedtime.    [provider]  triamcinolone cream (KENALOG) 0.1 % Apply 1 application topically daily as needed (Contact dermititis).  07/01/19   [provider]    Allergies    Duloxetine, Oxycontin [oxycodone hcl], Quinine, Voltaren [diclofenac sodium], Doxycycline, Hydrocodone, Zoloft [sertraline], and Morphine and related  Review of Systems   Review of Systems  Constitutional:  Negative for fever.  Respiratory:  Negative for shortness of breath.   Cardiovascular:  Negative for chest pain.  Gastrointestinal:  Negative for abdominal pain.  Genitourinary:  Positive for dysuria.  Musculoskeletal:  Positive for myalgias.   Negative for joint swelling.  Skin:  Positive for color change. Negative for wound.  Allergic/Immunologic: Positive for immunocompromised state.  Neurological:  Negative for weakness and numbness.  Hematological:  Negative for adenopathy. Does not bruise/bleed easily.  Psychiatric/Behavioral:  Negative for confusion.   All other systems reviewed and are negative.  Physical Exam Updated Vital Signs BP (!) 156/74   Pulse 62   Temp 98.5 F (36.9 C) (Oral)   Resp 18   Ht 6' (1.829 m)   Wt 116.6 kg   SpO2 99%   BMI 34.86 kg/m   Physical Exam Vitals and nursing note reviewed.  Constitutional:       General: He is not in acute distress.    Appearance: He is well-developed. He is not diaphoretic.  HENT:     Head: Normocephalic and atraumatic.  Cardiovascular:     Rate and Rhythm: Regular rhythm. Bradycardia present.     Pulses: Normal pulses.     Heart sounds: Normal heart sounds.  Pulmonary:     Effort: Pulmonary effort is normal.     Breath sounds: Normal breath sounds.  Abdominal:     Palpations: Abdomen is soft.     Tenderness: There is no abdominal tenderness.  Musculoskeletal:        General: Swelling and tenderness present.     Comments: Mild swelling of right leg compared to left, there is tenderness to the medial thigh extending to the medial knee with slight redness in this area as well.  DP pulses present.  Sensation intact.  Skin:    General: Skin is warm and dry.     Findings: Erythema present.  Neurological:     Mental Status: He is alert and oriented to person, place, and time.     Sensory: No sensory deficit.     Motor: No weakness.  Psychiatric:        Behavior: Behavior normal.    ED Results / Procedures / Treatments   Labs (all labs ordered are listed, but only abnormal results are displayed) Labs Reviewed  CBC WITH DIFFERENTIAL/PLATELET - Abnormal; Notable for the following components:      Result Value   Hemoglobin 12.9 (*)    HCT 37.6 (*)    Monocytes Absolute 1.1 (*)    All other components within normal limits  COMPREHENSIVE METABOLIC PANEL - Abnormal; Notable for the following components:   Sodium 133 (*)    Glucose, Bld 230 (*)    Calcium 10.5 (*)    All other components within normal limits  URINALYSIS, ROUTINE W REFLEX MICROSCOPIC - Abnormal; Notable for the following components:   APPearance CLOUDY (*)    Glucose, UA >=500 (*)    Hgb urine dipstick LARGE (*)    All other components within normal limits  URINALYSIS, MICROSCOPIC (REFLEX) - Abnormal; Notable for the following components:   Bacteria, UA RARE (*)    All other components  within normal limits  URINE CULTURE    EKG None  Radiology US Venous Img Lower Right (DVT Study)  Result Date: 08/06/2020 CLINICAL DATA:  Right thigh pain and swelling EXAM: RIGHT LOWER EXTREMITY VENOUS DOPPLER ULTRASOUND TECHNIQUE: Gray-scale sonography with graded compression, as well as color Doppler and duplex ultrasound were performed to evaluate the lower extremity deep venous systems from the level of the common femoral vein and including the common femoral, femoral, profunda femoral, popliteal and calf veins including the posterior tibial, peroneal and gastrocnemius veins when visible. The superficial great saphenous   vein was also interrogated. Spectral Doppler was utilized to evaluate flow at rest and with distal augmentation maneuvers in the common femoral, femoral and popliteal veins. COMPARISON:  None. FINDINGS: Contralateral Common Femoral Vein: Respiratory phasicity is normal and symmetric with the symptomatic side. No evidence of thrombus. Normal compressibility. Common Femoral Vein: No evidence of thrombus. Normal compressibility, respiratory phasicity and response to augmentation. Saphenofemoral Junction: No evidence of thrombus. Normal compressibility and flow on color Doppler imaging. Profunda Femoral Vein: No evidence of thrombus. Normal compressibility and flow on color Doppler imaging. Femoral Vein: No evidence of thrombus. Normal compressibility, respiratory phasicity and response to augmentation. Popliteal Vein: No evidence of thrombus. Normal compressibility, respiratory phasicity and response to augmentation. Calf Veins: Right posterior tibial vein within the mid calf is noncompressible with partial color flow suggesting a nonocclusive thrombus. Peroneal vein was not definitively visualized. Superficial Great Saphenous Vein: Superficial great saphenous vein is noncompressible from the level of the mid thigh to the mid calf. Venous Reflux:  None. Other Findings:  None. IMPRESSION:  1. Positive for nonocclusive DVT involving the right posterior tibial vein within the mid calf. 2. Negative for acute DVT above the level of the knee. 3. Superficial thrombosis of the great saphenous vein from the level of the mid thigh to the mid calf. Electronically Signed   By: [name]  Plundo D.O.   On: 08/06/2020 21:16    Procedures Procedures   Medications Ordered in ED Medications  apixaban (ELIQUIS) Education Kit for DVT/PE patients (has no administration in time range)    ED Course  I have reviewed the triage vital signs and the nursing notes.  Pertinent labs & imaging results that were available during my care of the patient were reviewed by me and considered in my medical decision making (see chart for details).  Clinical Course as of 08/06/20 2246  Fri Aug 06, 2020  2244 75-year-old male sent by PCP office with concern for possible DVT to right leg as above.  Found to have tenderness with overlying redness to the right medial thigh with swelling of the right leg compared to left.  Also secondary complaint of dysuria with concern for UTI.  Regards to UTI, there are no nitrites or leukocytes in the urine, there is rare bacteria, large hemoglobin similar to prior samples on file.  Due to concern for dysuria, a urine culture will be added onto the patient's work-up.  CBC and CMP without significant findings, glucose is 230 and this patient is a known diabetic.  Found to have distal nonocclusive DVT in the right leg is to start Eliquis, discussed risks of bleeding and need to return for head injury concerns otherwise follow-up with PCP for further management.  Given education kit in the ED with prescription to patient's pharmacy of choice. [LM]    Clinical Course User Index [LM] ,  A, PA-C   MDM Rules/Calculators/A&P                           Final Clinical Impression(s) / ED Diagnoses Final diagnoses:  None    Rx / DC Orders ED Discharge Orders           Ordered    APIXABAN (ELIQUIS) VTE STARTER PACK (10MG AND 5MG)        08/06/20 2240             ,  A, PA-C 08/06/20 2246    Trifan, Matthew J, MD 08/07/20 1057  

## 2020-08-08 LAB — URINE CULTURE: Culture: NO GROWTH

## 2020-08-11 DIAGNOSIS — K219 Gastro-esophageal reflux disease without esophagitis: Secondary | ICD-10-CM | POA: Diagnosis not present

## 2020-08-11 DIAGNOSIS — E1165 Type 2 diabetes mellitus with hyperglycemia: Secondary | ICD-10-CM | POA: Diagnosis not present

## 2020-08-11 DIAGNOSIS — M189 Osteoarthritis of first carpometacarpal joint, unspecified: Secondary | ICD-10-CM | POA: Diagnosis not present

## 2020-08-11 DIAGNOSIS — E1169 Type 2 diabetes mellitus with other specified complication: Secondary | ICD-10-CM | POA: Diagnosis not present

## 2020-08-11 DIAGNOSIS — E119 Type 2 diabetes mellitus without complications: Secondary | ICD-10-CM | POA: Diagnosis not present

## 2020-08-11 DIAGNOSIS — I10 Essential (primary) hypertension: Secondary | ICD-10-CM | POA: Diagnosis not present

## 2020-08-11 DIAGNOSIS — M1990 Unspecified osteoarthritis, unspecified site: Secondary | ICD-10-CM | POA: Diagnosis not present

## 2020-08-11 DIAGNOSIS — E7800 Pure hypercholesterolemia, unspecified: Secondary | ICD-10-CM | POA: Diagnosis not present

## 2020-08-13 ENCOUNTER — Emergency Department (HOSPITAL_BASED_OUTPATIENT_CLINIC_OR_DEPARTMENT_OTHER): Payer: HMO

## 2020-08-13 ENCOUNTER — Encounter (HOSPITAL_BASED_OUTPATIENT_CLINIC_OR_DEPARTMENT_OTHER): Payer: Self-pay | Admitting: *Deleted

## 2020-08-13 ENCOUNTER — Other Ambulatory Visit: Payer: Self-pay

## 2020-08-13 ENCOUNTER — Emergency Department (HOSPITAL_BASED_OUTPATIENT_CLINIC_OR_DEPARTMENT_OTHER)
Admission: EM | Admit: 2020-08-13 | Discharge: 2020-08-13 | Disposition: A | Discharge status: Home / Self Care | Payer: HMO | Attending: Emergency Medicine | Admitting: Emergency Medicine

## 2020-08-13 DIAGNOSIS — I824Z3 Acute embolism and thrombosis of unspecified deep veins of distal lower extremity, bilateral: Secondary | ICD-10-CM | POA: Diagnosis not present

## 2020-08-13 DIAGNOSIS — Z87891 Personal history of nicotine dependence: Secondary | ICD-10-CM | POA: Insufficient documentation

## 2020-08-13 DIAGNOSIS — E119 Type 2 diabetes mellitus without complications: Secondary | ICD-10-CM | POA: Insufficient documentation

## 2020-08-13 DIAGNOSIS — Z794 Long term (current) use of insulin: Secondary | ICD-10-CM | POA: Diagnosis not present

## 2020-08-13 DIAGNOSIS — Z96653 Presence of artificial knee joint, bilateral: Secondary | ICD-10-CM | POA: Diagnosis not present

## 2020-08-13 DIAGNOSIS — M7989 Other specified soft tissue disorders: Secondary | ICD-10-CM | POA: Diagnosis not present

## 2020-08-13 DIAGNOSIS — I10 Essential (primary) hypertension: Secondary | ICD-10-CM | POA: Insufficient documentation

## 2020-08-13 DIAGNOSIS — Z79899 Other long term (current) drug therapy: Secondary | ICD-10-CM | POA: Diagnosis not present

## 2020-08-13 DIAGNOSIS — Z86718 Personal history of other venous thrombosis and embolism: Secondary | ICD-10-CM | POA: Diagnosis not present

## 2020-08-13 DIAGNOSIS — Z7982 Long term (current) use of aspirin: Secondary | ICD-10-CM | POA: Insufficient documentation

## 2020-08-13 DIAGNOSIS — I824Z1 Acute embolism and thrombosis of unspecified deep veins of right distal lower extremity: Secondary | ICD-10-CM | POA: Diagnosis not present

## 2020-08-13 DIAGNOSIS — M79662 Pain in left lower leg: Secondary | ICD-10-CM | POA: Diagnosis present

## 2020-08-13 DIAGNOSIS — Z8546 Personal history of malignant neoplasm of prostate: Secondary | ICD-10-CM | POA: Diagnosis not present

## 2020-08-13 DIAGNOSIS — I824Z2 Acute embolism and thrombosis of unspecified deep veins of left distal lower extremity: Secondary | ICD-10-CM | POA: Diagnosis not present

## 2020-08-13 DIAGNOSIS — E1065 Type 1 diabetes mellitus with hyperglycemia: Secondary | ICD-10-CM | POA: Diagnosis not present

## 2020-08-13 MED ORDER — PRADAXA 110 MG PO CAPS: 110.0000 mg | ORAL_CAPSULE | Freq: Two times a day (BID) | ORAL | 0 refills | Status: DC

## 2020-08-13 NOTE — ED Triage Notes (Signed)
Pain in his left lower leg. He was diagnosed with a DVT last week in his right lower leg. He was started blood thinners but stopped after 6 days due to hematuria. His MD told him to stop the blood thinner. Denies SOB.

## 2020-08-13 NOTE — Discharge Instructions (Addendum)
Stop taking your Eliquis.  Do not take aspirin, Advil, Motrin, Aleve or Celebrex while taking the Pradaxa.  Follow-up with your doctor as scheduled.  Return to the emergency room if you have any worsening symptoms.  You do have an area of swelling/nodule in your left lower leg on the ultrasound.  The radiologist is recommending that you have a repeat ultrasound in 6 to 8 weeks to reassess this area.  This can be ordered by your primary care doctor.

## 2020-08-13 NOTE — ED Notes (Signed)
Called [facility] regarding oncology consult.  They will page out for Dr. Tamera Punt

## 2020-08-13 NOTE — ED Notes (Signed)
See EDP assessment 

## 2020-08-13 NOTE — ED Provider Notes (Addendum)
[facility] EMERGENCY DEPARTMENT Provider Note   CSN: [id number] Arrival date & time: 08/13/20  1422     History Chief Complaint  Patient presents with   Leg Pain    Derek [name] is a 75 y.o. male.  Patient is a 75 year old male who presents with a knot to his left lower leg.  He was seen here last Friday with some soreness in his right leg.  He was diagnosed with a DVT.  He was started on Eliquis.  He took 3 doses of it and then developed hematuria.  His PCP advised him to stop taking the Eliquis.  Yesterday he noticed a knot to his left lower leg.  He talked to his primary care provider office and was advised to come to the emergency room.  He denies any chest pain.  No shortness of breath.  No fevers.  No injury to the leg.  He does ride a motorcycle frequently and at times rides about 400 miles a day.  He has not really noticed any swelling of his legs.      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" no pulmonary md, sees dr [name]   Factor V Leiden mutation ([place])    "never had blood clots"   GERD (gastroesophageal reflux disease)    Hiatal hernia    History of kidney stones    multiple kidney stones-not a problem at present   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, pT1 pNX) no further treatment   History of panic attacks    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   Foreign body of abdominal wall 09/04/2016   Ventral incisional hernia 11/12/2015   Incisional hernia 11/10/2015   Infected pancreatic pseudocyst 05/13/2015   Pleural effusion on left    Lactose intolerance in adult 04/04/2015   Protein-calorie malnutrition, moderate (HCC) 04/04/2015   Pleural effusion, left    Debility    Benign  essential HTN    Leukocytosis    Thrombocytosis    Intra-abdominal abscess (HCC)    Poorly controlled type 2 diabetes mellitus ([place])    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 02/24/2015   OA (osteoarthritis) of knee 06/02/2013   Anal fissure 02/04/2013   Meniere's disease    GERD (gastroesophageal reflux disease)    Kidney stone    Obstructive sleep apnea    Morbid obesity (HCC)    Synovitis of knee 03/01/2012   Chronic cough 11/03/2011   COSTOCHONDRITIS, LEFT 10/08/2009   RIB PAIN, LEFT SIDED 10/08/2009    Past Surgical History:  Procedure Laterality Date   ANAL FISTULECTOMY N/A 04/01/2013   Procedure: EXAM UNDER ANESTHESIA WITH ANAL FISSURectomy, sphincterotomy and internal hemorrhoidectomy;  Surgeon: Harl Bowie, MD;  Location: [facility];  Service: General;  Laterality: N/A;   APPLICATION OF  WOUND VAC N/A 05/16/2015   Procedure: APPLICATION OF WOUND VAC;  Surgeon: Armandina Gemma, MD;  Location: [facility];  Service: General;  Laterality: N/A;   APPLICATION OF WOUND VAC N/A 05/20/2015   Procedure: EXHANGE OF ABDOMINAL  WOUND VAC DRESSING;  Surgeon: Armandina Gemma, MD;  Location: [facility];  Service: General;  Laterality: N/A;   COLONOSCOPY     CYSTOSCOPY WITH INSERTION OF UROLIFT N/A 10/12/2016   Procedure: CYSTOSCOPY WITH INSERTION OF UROLIFT;  Surgeon: Franchot Gallo, MD;  Location[facility];  Service: Urology;  Laterality: N/A;   EUS N/A 12/31/2014   Procedure: UPPER ENDOSCOPIC ULTRASOUND (EUS) LINEAR;  Surgeon: Milus Banister, MD;  Location: [facility];  Service: Endoscopy;  Laterality: N/A;   HARDWARE REMOVAL Left 2013   left ankle   HEMORRHOIDECTOMY WITH HEMORRHOID BANDING     INCISION AND DRAINAGE ABSCESS N/A 05/14/2015   Procedure: DRAINAGE OF INFECTED PANCREATIC PSEUDOCYST;  Surgeon: Armandina Gemma, MD;  Location: [facility];  Service: General;  Laterality: N/A;   INCISION AND DRAINAGE OF WOUND N/A 05/16/2015   Procedure: IRRIGATION AND DEBRIDEMENT WOUND;  Surgeon: Armandina Gemma, MD;  Location: [facility];  Service: General;  Laterality: N/A;   INCISIONAL HERNIA REPAIR N/A 11/12/2015   Procedure: REPAIR INCISIONAL HERNIA WITH MESH;  Surgeon: Armandina Gemma, MD;  Location: [facility];  Service: General;  Laterality: N/A;   INGUINAL HERNIA REPAIR Right 1974;  [place] N/A 11/12/2015   Procedure: INSERTION OF MESH;  Surgeon: Armandina Gemma, MD;  Location: [facility];  Service: General;  Laterality: N/A;   IR GENERIC HISTORICAL  04/20/2015   IR RADIOLOGIST EVAL & MGMT 04/20/2015 [facility] INTERV RAD   KNEE ARTHROSCOPY Bilateral right 08-08-2000;  left 11-23-2000   meniscal repair and chondroplasty's   KNEE ARTHROSCOPY  03/01/2012   Procedure: ARTHROSCOPY KNEE;  Surgeon: Gearlean Alf, MD;  Location: [facility];  Service: Orthopedics;  Laterality: Left;  WITH SYNOVECTOMY     LAPAROTOMY N/A 05/14/2015   Procedure: EXPLORATORY LAPAROTOMY;  Surgeon: Armandina Gemma, MD;  Location: [facility];  Service: General;  Laterality: N/A;   MASS EXCISION N/A 12/24/2019   Procedure: EXCISION LIP CARCINOMA;  Surgeon: Leta Baptist, MD;  Location: [facility];  Service: ENT;  Laterality: N/A;   ORIF LEFT ANKLE FX  08/20/2009   distal tib-fib and malleolus   ROTATOR CUFF REPAIR Bilateral right 1994; left 1993, left 1993   TOTAL KNEE ARTHROPLASTY Right 06/02/2013   Procedure: RIGHT TOTAL KNEE ARTHROPLASTY;  Surgeon: Gearlean Alf, MD;  Location[facility];  Service: Orthopedics;  Laterality: Right;   TOTAL KNEE ARTHROPLASTY Left 01-31-2008   dr Wynelle Link   TOTAL KNEE REVISION Right 01/23/2018   Procedure: right knee polyethylene revision;  Surgeon: Gaynelle Arabian, MD;  Location: [facility];  Service: Orthopedics;  Laterality: Right;  17min   TRANSURETHRAL RESECTION OF PROSTATE N/A 04/21/2019   Procedure: TRANSURETHRAL RESECTION OF THE PROSTATE (TURP);  Surgeon: Franchot Gallo, MD;  Location[facility];  Service: Urology;  Laterality: N/A;   UMBILICAL HERNIA REPAIR  08-11-1999   dr Ninfa Linden   UPPER GASTROINTESTINAL ENDOSCOPY  sept 2016   WOUND EXPLORATION N/A 09/21/2016   Procedure: WOUND EXPLORATION AND DEBRIDEMENT ABDOMINAL WALL;  Surgeon: Armandina Gemma, MD;  Location: [facility];  Service: General;  Laterality: N/A;       Family History  Problem Relation Age of Onset   Bone cancer Father        Jaw   Factor V Leiden deficiency Daughter    Diabetes Maternal Grandmother    Heart disease Paternal Uncle    Colon cancer Neg Hx    Esophageal cancer Neg Hx    Stomach cancer Neg Hx    Rectal cancer Neg Hx     Social History   Tobacco Use   Smoking status: Former    Packs/day: 4.00    Years: 17.00    Pack years: 68.00    Types: Cigarettes    Quit date: 02/21/1975    Years since quitting: 45.5   Smokeless tobacco: Never  Vaping Use   Vaping Use: Never used  Substance Use Topics    Alcohol use: No    Alcohol/week: 0.0 standard drinks   Drug use: No    Home Medications Prior to Admission medications   Medication Sig Start Date End Date Taking? Authorizing Provider  Dabigatran Etexilate Mesylate (PRADAXA) 110 MG CAPS Take 110 mg by mouth 2 (two) times daily. 08/13/20  Yes Malvin Johns, MD  amoxicillin (AMOXIL) 875 MG tablet TAKE 1 TABLET (875 MG TOTAL) BY MOUTH 2 (TWO) TIMES DAILY FOR 5 DAYS. 12/24/19 12/23/20  Leta Baptist, MD  aspirin EC 81 MG tablet Take 81 mg by mouth at bedtime. Swallow whole.    [provider]  carvedilol (COREG) 25 MG tablet Take 0.5 tablets (12.5 mg total) by mouth 2 (two) times daily with a meal. Patient taking differently: Take 25 mg by mouth every evening.  04/06/15   Michael Boston, MD  carvedilol (COREG) 25 MG tablet TAKE 1 TABLET BY MOUTH ONCE DAILY 07/01/19 06/30/20  [name], MD  carvedilol (COREG) 25 MG tablet Take one tablet by mouth once daily in the evening. 07/01/20     celecoxib (CELEBREX) 200 MG capsule TAKE 1 CAPSULE BY MOUTH 2 TIMES DAILY WITH FOOD 04/26/20 04/26/21  [name], MD  clindamycin (CLEOCIN) 300 MG capsule TAKE 1 CAPSULE BY MOUTH EVERY 8 HOURS UNTIL FINISHED 03/26/20 03/26/21    COVID-19 mRNA vaccine, Pfizer, 30 MCG/0.3ML injection INJECT AS DIRECTED 01/05/20 01/04/21  Carlyle Basques, MD  HYDROmorphone (DILAUDID) 4 MG tablet Take 8 mg by mouth 4 (four) times daily as needed for moderate pain.     [provider]  HYDROmorphone (DILAUDID) 8 MG tablet TAKE 1 TABLET BY MOUTH 7 TIMES A DAY 05/16/20 11/12/20  Levy Pupa, PA-C  HYDROmorphone (DILAUDID) 8 MG tablet TAKE 1 TABLET BY MOUTH 7 TIMES A DAY 04/16/20 10/13/20  Levy Pupa, PA-C  HYDROmorphone (DILAUDID) 8 MG tablet TAKE 1 TABLET BY MOUTH 7  TIMES DAILY 03/18/20 09/14/20  Levy Pupa, PA-C  HYDROmorphone (DILAUDID) 8 MG tablet Take 1 tablet by mouth 7 times a day as needed 07/20/20     insulin aspart (NOVOLOG) 100 UNIT/ML injection Inject 0-9  Units into the skin 3 (three) times daily before meals. Patient taking differently: Inject 5-30 Units into the skin 3 (three) times daily before meals. Per sliding scale 03/03/15   Armandina Gemma, MD  insulin aspart (NOVOLOG) 100 UNIT/ML injection USE 100U/DAY SUBCUTANEOUS VIA PUMP 30 DAY(S) 12/23/19 12/22/20  Jacelyn Pi, MD  insulin aspart (NOVOLOG) 100 UNIT/ML injection Use 100 units daily under the skin via pump 12/23/19     insulin degludec (TRESIBA FLEXTOUCH) 100 UNIT/ML FlexTouch Pen Inject 14 units under the skin once a day 06/28/20     insulin degludec (TRESIBA FLEXTOUCH) 100 UNIT/ML FlexTouch Pen Inject 14 units Subcutaneous Once a day 30 days 07/22/20     insulin degludec (TRESIBA) 100 UNIT/ML FlexTouch Pen INJECT 10 UNITS UNDER THE SKIN ONCE A DAY 03/31/20 03/31/21  Jacelyn Pi, MD  Insulin Disposable Pump (OMNIPOD DASH 5 PACK PODS) MISC Inject into the skin continuous. Change every three days 12/15/19   [provider]  insulin NPH Human (HUMULIN N,NOVOLIN N) 100 UNIT/ML injection Inject 25-50 Units into the skin See admin instructions. Inject 50 units SQ in the morning, inject 40  units SQ at lunch and inject 40 units SQ at bedtime    [provider]  Insulin Syringe-Needle U-100 31G X 5/16" 1 ML MISC USE AS DIRECTED TO INJECT 01/19/20 01/18/21  Jacelyn Pi, MD  lansoprazole (PREVACID) 30 MG capsule Take 30 mg by mouth daily as needed (for acid reflux).     [provider]  lansoprazole (PREVACID) 30 MG capsule TAKE 1 CAPSULE BY MOUTH ONCE DAILY 04/30/20 04/30/21  [name], MD  lansoprazole (PREVACID) 30 MG capsule TAKE 1 CAPSULE BY MOUTH ONCE DAILY 10/24/19 10/23/20  [name], MD  lansoprazole (PREVACID) 30 MG capsule Take one capsule by mouth once daily. 07/01/20     losartan (COZAAR) 50 MG tablet Take 50 mg by mouth at bedtime.     [provider]  losartan (COZAAR) 50 MG tablet TAKE 1 TABLET BY MOUTH ONCE DAILY 07/01/19 06/30/20  [name], MD  losartan (COZAAR) 50 MG tablet Take one tablet by mouth every evening. 07/01/20     magnesium oxide (MAG-OX) 400 MG tablet Take 800 mg by mouth at bedtime. Triple complex    [provider]  megestrol (MEGACE) 40 MG tablet Take one tablet by mouth twice daily. 07/01/20     polyethylene glycol powder (MIRALAX) 17 GM/SCOOP powder Mix 1 scoop (17g) in 8 ounces of water or juice once daily 08/02/20     pravastatin (PRAVACHOL) 20 MG tablet Take 20 mg by mouth See admin instructions. Every Monday and Friday    [provider]  pravastatin (PRAVACHOL) 20 MG tablet Take one tablet by mouth twice weekly. 07/01/20     Probiotic Product (PROBIOTIC DAILY PO) Take 1 tablet by mouth at bedtime.    [provider]  triamcinolone cream (KENALOG) 0.1 % Apply 1 application topically daily as needed (Contact dermititis).  07/01/19   [provider]    Allergies    Duloxetine, Oxycontin [oxycodone hcl], Quinine, Voltaren [diclofenac sodium], Doxycycline, Hydrocodone, Zoloft [sertraline], and Morphine and related  Review of Systems   Review of Systems  Constitutional:  Negative for chills, diaphoresis, fatigue and fever.  HENT:  Negative for congestion, rhinorrhea and sneezing.   Eyes: Negative.   Respiratory:  Negative for cough, chest tightness and shortness of breath.   Cardiovascular:  Negative for chest pain and leg swelling.  Gastrointestinal:  Negative for abdominal pain, blood in stool, diarrhea, nausea and vomiting.  Genitourinary:  Negative for difficulty urinating, flank pain, frequency and hematuria.  Musculoskeletal:  Positive for myalgias. Negative for arthralgias and back pain.  Skin:  Negative for rash.  Neurological:  Negative for dizziness, speech difficulty, weakness, numbness and headaches.   Physical Exam Updated Vital Signs BP 139/66   Pulse 66   Temp 98.8 F (37.1 C) (Oral)   Resp 20   Ht 6' (1.829 m)   Wt 116.6 kg   SpO2 99%    BMI 34.86 kg/m   Physical Exam Constitutional:      Appearance: He is well-developed.  HENT:     Head: Normocephalic and atraumatic.  Eyes:     Pupils: Pupils are equal, round, and reactive to light.  Cardiovascular:     Rate and Rhythm: Normal rate and regular rhythm.     Heart sounds: Normal heart sounds.  Pulmonary:     Effort: Pulmonary effort is normal. No respiratory distress.     Breath sounds: Normal breath sounds. No wheezing or rales.  Chest:     Chest wall: No tenderness.  Abdominal:     General: Bowel sounds are normal.     Palpations: Abdomen is soft.     Tenderness: There is no abdominal tenderness. There is no guarding or rebound.  Musculoskeletal:        General: Normal range of motion.     Cervical back: Normal range of motion and neck supple.     Comments: Trace edema to lower extremities bilaterally.  He has a firm sore area to his left lower leg towards the posterior aspect of the ankle.  No bony tenderness.  There is no warmth or erythema.  No signs of infection.  Pedal pulses are intact.  Lymphadenopathy:     Cervical: No cervical adenopathy.  Skin:    General: Skin is warm and dry.     Findings: No rash.  Neurological:     Mental Status: He is alert and oriented to person, place, and time.    ED Results / Procedures / Treatments   Labs (all labs ordered are listed, but only abnormal results are displayed) Labs Reviewed - No data to display  EKG None  Radiology US Venous Img Lower Unilateral Left (DVT)  Addendum Date: 08/13/2020   ADDENDUM REPORT: 08/13/2020 18:12 ADDENDUM: These results were called by telephone at the time of interpretation on 08/13/2020 at 6:11 pm to provider Anice Wilshire , who verbally acknowledged these results. Electronically Signed   By: Zetta Bills M.D.   On: 08/13/2020 18:12   Result Date: 08/13/2020 CLINICAL DATA:  Pain and swelling with history of prior DVT involving LEFT lower extremity. EXAM: LEFT LOWER EXTREMITY  VENOUS DOPPLER ULTRASOUND TECHNIQUE: Gray-scale sonography with graded compression, as well as color Doppler and duplex ultrasound were performed to evaluate the lower extremity deep venous systems from the level of the common femoral vein and including the common femoral, femoral, profunda femoral, popliteal and calf veins including the posterior tibial, peroneal and gastrocnemius veins when visible. The superficial great saphenous vein was also interrogated. Spectral Doppler was utilized to evaluate flow at rest and with distal augmentation maneuvers in the common femoral, femoral and popliteal  veins. COMPARISON:  08/06/2020 FINDINGS: Contralateral Common Femoral Vein: Respiratory phasicity is normal and symmetric with the symptomatic side. No evidence of thrombus. Normal compressibility. Common Femoral Vein: No evidence of thrombus. Normal compressibility, respiratory phasicity and response to augmentation. Saphenofemoral Junction: No evidence of thrombus. Normal compressibility and flow on color Doppler imaging. Profunda Femoral Vein: No evidence of thrombus. Normal compressibility and flow on color Doppler imaging. Femoral Vein: No evidence of thrombus. Normal compressibility, respiratory phasicity and response to augmentation. Popliteal Vein: No evidence of thrombus. Normal compressibility, respiratory phasicity and response to augmentation. Calf Veins: Occlusive thrombus in the posterior tibial vein in the LEFT calf. Other Findings: Hypoechoic area within the calf musculature without internal flow measuring approximately 1.2 x 1.0 cm. IMPRESSION: Signs of deep venous thrombosis in the LEFT calf involving the posterior tibial vein in this patient with known thrombus discovered on June 17th in the contralateral lower extremity. Negative for acute DVT above the level of the knee. Hypoechoic area within the musculature in the area of concern exhibited by the patient. This could represent a small hematoma or  intramuscular edema. This is indeterminate. Small mass lesion not excluded based on appearance. Consider short interval follow-up after therapy in 6-8 weeks for further evaluation. Electronically Signed: By: Zetta Bills M.D. On: 08/13/2020 18:07    Procedures Procedures   Medications Ordered in ED Medications - No data to display  ED Course  I have reviewed the triage vital signs and the nursing notes.  Pertinent labs & imaging results that were available during my care of the patient were reviewed by me and considered in my medical decision making (see chart for details).    MDM Rules/Calculators/A&P                          Patient is a 75 year old with a history of a recently diagnosed DVT in the right leg.  He stopped his Eliquis due to hematuria.  He now has a newly diagnosed DVT in his left leg.  I spoke with Dr. [name].  He recommended trying him on a different anticoagulant such as Pradaxa.  He recommends Pradaxa at 110 mg twice daily.  I also advised him to stop taking his baby aspirin and obviously his Eliquis as well as any other NSAID type medication.  Celebrex is listed on his medications but he is not currently taking it.  He and his wife are also advised that there was an area in the ultrasound of his left leg that looked a little hypoechoic.  It was recommended that he have a follow-up ultrasound in 6 to 8 weeks to reassess the area.  This can be done by his PCP.  He has a follow-up appointment with his PCP on July 5.  He was advised to follow-up sooner for any ongoing problems.  Return precautions were given.  21:32 patient's pharmacy did not have the 110 mg Pradaxa capsules.  I spoke to [place] in [facility] that has the 75 mg tablets in the 150.  They said that they can order the 110 mg tablets and get them in the next few days.  We will start 75 mg tablets twice daily over the weekend and then transition to the 110 mg tablets. Final Clinical Impression(s) / ED  Diagnoses Final diagnoses:  Acute deep vein thrombosis (DVT) of distal vein of both lower extremities ([place])    Rx / DC Orders ED Discharge Orders  Ordered    Dabigatran Etexilate Mesylate (PRADAXA) 110 MG CAPS  2 times daily        08/13/20 1857             Malvin Johns, MD 08/13/20 5993    Malvin Johns, MD 08/13/20 2132

## 2020-08-14 ENCOUNTER — Telehealth (HOSPITAL_BASED_OUTPATIENT_CLINIC_OR_DEPARTMENT_OTHER): Payer: Self-pay | Admitting: Emergency Medicine

## 2020-08-14 NOTE — Telephone Encounter (Signed)
The patient had a DVT that had failed DOAC therapy.  Case was discussed with a hematologist who recommended Pradaxa.  Unfortunately the patient is unable to afford this medication.  We will put a consult in the social work to try and assist.

## 2020-08-16 ENCOUNTER — Other Ambulatory Visit (HOSPITAL_BASED_OUTPATIENT_CLINIC_OR_DEPARTMENT_OTHER): Payer: Self-pay

## 2020-08-16 ENCOUNTER — Ambulatory Visit
Admission: RE | Admit: 2020-08-16 | Discharge: 2020-08-16 | Disposition: A | Discharge status: Home / Self Care | Payer: HMO | Source: Ambulatory Visit | Attending: Family Medicine | Admitting: Family Medicine

## 2020-08-16 ENCOUNTER — Other Ambulatory Visit: Payer: Self-pay

## 2020-08-16 ENCOUNTER — Other Ambulatory Visit: Payer: Self-pay | Admitting: Family Medicine

## 2020-08-16 DIAGNOSIS — R310 Gross hematuria: Secondary | ICD-10-CM

## 2020-08-16 DIAGNOSIS — R319 Hematuria, unspecified: Secondary | ICD-10-CM | POA: Diagnosis not present

## 2020-08-16 DIAGNOSIS — R109 Unspecified abdominal pain: Secondary | ICD-10-CM | POA: Diagnosis not present

## 2020-08-16 DIAGNOSIS — K449 Diaphragmatic hernia without obstruction or gangrene: Secondary | ICD-10-CM | POA: Diagnosis not present

## 2020-08-16 DIAGNOSIS — I82443 Acute embolism and thrombosis of tibial vein, bilateral: Secondary | ICD-10-CM | POA: Diagnosis not present

## 2020-08-16 DIAGNOSIS — R300 Dysuria: Secondary | ICD-10-CM | POA: Diagnosis not present

## 2020-08-16 DIAGNOSIS — D6851 Activated protein C resistance: Secondary | ICD-10-CM | POA: Diagnosis not present

## 2020-08-16 MED ORDER — AMOXICILLIN-POT CLAVULANATE 875-125 MG PO TABS
ORAL_TABLET | ORAL | 0 refills | Status: DC
  Filled 2020-08-16: qty 20, 10d supply, fill #0

## 2020-08-17 ENCOUNTER — Other Ambulatory Visit (HOSPITAL_BASED_OUTPATIENT_CLINIC_OR_DEPARTMENT_OTHER): Payer: Self-pay

## 2020-08-17 MED ORDER — HYDROMORPHONE HCL 8 MG PO TABS
ORAL_TABLET | ORAL | 0 refills | Status: DC
  Filled 2020-08-17: qty 210, 30d supply, fill #0

## 2020-08-18 ENCOUNTER — Other Ambulatory Visit: Payer: Self-pay | Admitting: Family Medicine

## 2020-08-18 ENCOUNTER — Other Ambulatory Visit: Payer: Self-pay

## 2020-08-18 ENCOUNTER — Emergency Department (HOSPITAL_COMMUNITY)
Admission: EM | Admit: 2020-08-18 | Discharge: 2020-08-18 | Disposition: A | Discharge status: Home / Self Care | Payer: HMO | Attending: Emergency Medicine | Admitting: Emergency Medicine

## 2020-08-18 ENCOUNTER — Encounter (HOSPITAL_COMMUNITY): Payer: Self-pay | Admitting: *Deleted

## 2020-08-18 ENCOUNTER — Emergency Department (HOSPITAL_COMMUNITY): Payer: HMO

## 2020-08-18 DIAGNOSIS — Z794 Long term (current) use of insulin: Secondary | ICD-10-CM | POA: Diagnosis not present

## 2020-08-18 DIAGNOSIS — R319 Hematuria, unspecified: Secondary | ICD-10-CM | POA: Insufficient documentation

## 2020-08-18 DIAGNOSIS — R079 Chest pain, unspecified: Secondary | ICD-10-CM | POA: Diagnosis not present

## 2020-08-18 DIAGNOSIS — R059 Cough, unspecified: Secondary | ICD-10-CM | POA: Diagnosis not present

## 2020-08-18 DIAGNOSIS — Z96653 Presence of artificial knee joint, bilateral: Secondary | ICD-10-CM | POA: Insufficient documentation

## 2020-08-18 DIAGNOSIS — Z87891 Personal history of nicotine dependence: Secondary | ICD-10-CM | POA: Diagnosis not present

## 2020-08-18 DIAGNOSIS — Z79899 Other long term (current) drug therapy: Secondary | ICD-10-CM | POA: Insufficient documentation

## 2020-08-18 DIAGNOSIS — R69 Illness, unspecified: Secondary | ICD-10-CM

## 2020-08-18 DIAGNOSIS — R6884 Jaw pain: Secondary | ICD-10-CM | POA: Diagnosis not present

## 2020-08-18 DIAGNOSIS — I10 Essential (primary) hypertension: Secondary | ICD-10-CM | POA: Insufficient documentation

## 2020-08-18 DIAGNOSIS — R310 Gross hematuria: Secondary | ICD-10-CM

## 2020-08-18 DIAGNOSIS — Z7901 Long term (current) use of anticoagulants: Secondary | ICD-10-CM | POA: Diagnosis not present

## 2020-08-18 DIAGNOSIS — R001 Bradycardia, unspecified: Secondary | ICD-10-CM | POA: Diagnosis not present

## 2020-08-18 DIAGNOSIS — E119 Type 2 diabetes mellitus without complications: Secondary | ICD-10-CM | POA: Diagnosis not present

## 2020-08-18 DIAGNOSIS — K449 Diaphragmatic hernia without obstruction or gangrene: Secondary | ICD-10-CM | POA: Diagnosis not present

## 2020-08-18 DIAGNOSIS — Z8507 Personal history of malignant neoplasm of pancreas: Secondary | ICD-10-CM | POA: Insufficient documentation

## 2020-08-18 DIAGNOSIS — R0789 Other chest pain: Secondary | ICD-10-CM | POA: Diagnosis not present

## 2020-08-18 LAB — COMPREHENSIVE METABOLIC PANEL
ALT: 20 U/L (ref 0–44)
AST: 39 U/L (ref 15–41)
Albumin: 3.2 g/dL — ABNORMAL LOW (ref 3.5–5.0)
Alkaline Phosphatase: 74 U/L (ref 38–126)
Anion gap: 6 (ref 5–15)
BUN: 26 mg/dL — ABNORMAL HIGH (ref 8–23)
CO2: 19 mmol/L — ABNORMAL LOW (ref 22–32)
Calcium: 10.5 mg/dL — ABNORMAL HIGH (ref 8.9–10.3)
Chloride: 106 mmol/L (ref 98–111)
Creatinine, Ser: 1.14 mg/dL (ref 0.61–1.24)
GFR, Estimated: >60 mL/min (ref >60)
Glucose, Bld: 199 mg/dL — ABNORMAL HIGH (ref 70–99)
Potassium: 4.7 mmol/L (ref 3.5–5.1)
Sodium: 131 mmol/L — ABNORMAL LOW (ref 135–145)
Total Bilirubin: 0.5 mg/dL (ref 0.3–1.2)
Total Protein: 6.1 g/dL — ABNORMAL LOW (ref 6.5–8.1)

## 2020-08-18 LAB — CBC WITH DIFFERENTIAL/PLATELET
Abs Immature Granulocytes: 0.01 10*3/uL (ref 0.00–0.07)
Basophils Absolute: 0.1 10*3/uL (ref 0.0–0.1)
Basophils Relative: 2 %
Eosinophils Absolute: 0.3 10*3/uL (ref 0.0–0.5)
Eosinophils Relative: 5 %
HCT: 34.7 % — ABNORMAL LOW (ref 39.0–52.0)
Hemoglobin: 11.7 g/dL — ABNORMAL LOW (ref 13.0–17.0)
Immature Granulocytes: 0 %
Lymphocytes Relative: 40 %
Lymphs Abs: 2.8 10*3/uL (ref 0.7–4.0)
MCH: 29.3 pg (ref 26.0–34.0)
MCHC: 33.7 g/dL (ref 30.0–36.0)
MCV: 86.8 fL (ref 80.0–100.0)
Monocytes Absolute: 1 10*3/uL (ref 0.1–1.0)
Monocytes Relative: 14 %
Neutro Abs: 2.7 10*3/uL (ref 1.7–7.7)
Neutrophils Relative %: 39 %
Platelets: 359 10*3/uL (ref 150–400)
RBC: 4 MIL/uL — ABNORMAL LOW (ref 4.22–5.81)
RDW: 13.3 % (ref 11.5–15.5)
WBC: 6.9 10*3/uL (ref 4.0–10.5)
nRBC: 0 % (ref 0.0–0.2)

## 2020-08-18 LAB — TROPONIN I (HIGH SENSITIVITY)
Troponin I (High Sensitivity): 10 ng/L (ref <18)
Troponin I (High Sensitivity): 10 ng/L (ref <18)

## 2020-08-18 LAB — LIPASE, BLOOD: Lipase: 21 U/L (ref 11–51)

## 2020-08-18 NOTE — Discharge Instructions (Signed)
1.  At this time your pain is resolved.  Return if you develop shortness of breath, persisting worsening or new associated symptoms. 2.  Continue to monitor your symptoms and follow-up with your doctor within about a week, or as scheduled.

## 2020-08-18 NOTE — ED Provider Notes (Signed)
[facility] EMERGENCY DEPARTMENT Provider Note   CSN: [id number] Arrival date & time: 08/18/20  1303     History Chief Complaint  Patient presents with   Chest Pain    Derek DOLINGER is a 75 y.o. male.  HPI Patient reports that he has just waking up of at around noon.  He reports he got a sudden pain in his left jaw kind of like he been punched or hit.  He rolled over onto the left side and then the pain went down into his shoulder and upper arm.  Patient's pain is now resolved.  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  He is compliant with his Eliquis.  No recent fevers or chills.  No recent productive cough.  Patient does report since being on Eliquis he is having a lot of blood in the urine.    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" no pulmonary md, sees dr [name]   Factor V Leiden mutation ([place])    "never had blood clots"   GERD (gastroesophageal reflux disease)    Hiatal hernia    History of kidney stones    multiple kidney stones-not a problem at present   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, pT1 pNX) no further treatment   History of panic attacks    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   Foreign body of abdominal wall 09/04/2016   Ventral incisional hernia 11/12/2015   Incisional hernia 11/10/2015   Infected pancreatic pseudocyst 05/13/2015   Pleural effusion on left    Lactose intolerance in adult 04/04/2015   Protein-calorie malnutrition, moderate ([place]) 04/04/2015   Pleural effusion, left    Debility    Benign essential HTN    Leukocytosis    Thrombocytosis    Intra-abdominal abscess (HCC)    Poorly controlled type 2 diabetes mellitus ([place])    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 02/25/2015 02/24/2015   OA (osteoarthritis) of knee 06/02/2013   Anal fissure 02/04/2013   Meniere's disease    GERD (gastroesophageal reflux disease)    Kidney stone    Obstructive sleep apnea    Morbid obesity (HCC)    Synovitis of knee 03/01/2012   Chronic cough 11/03/2011   COSTOCHONDRITIS, LEFT 10/08/2009   RIB PAIN, LEFT SIDED 10/08/2009    Past Surgical History:  Procedure Laterality Date   ANAL FISTULECTOMY N/A 04/01/2013   Procedure: EXAM UNDER ANESTHESIA WITH ANAL FISSURectomy, sphincterotomy and internal hemorrhoidectomy;  Surgeon: Harl Bowie, MD;  Location: [place];  Service: General;  Laterality: N/A;   APPLICATION OF WOUND VAC N/A 05/16/2015   Procedure: APPLICATION OF WOUND VAC;  Surgeon: Armandina Gemma, MD;  Location: [facility];  Service: General;   Laterality: N/A;   APPLICATION OF WOUND VAC N/A 05/20/2015   Procedure: EXHANGE OF ABDOMINAL  WOUND VAC DRESSING;  Surgeon: Armandina Gemma, MD;  Location: [facility];  Service: General;  Laterality: N/A;   COLONOSCOPY     CYSTOSCOPY WITH INSERTION OF UROLIFT N/A 10/12/2016   Procedure: CYSTOSCOPY WITH INSERTION OF UROLIFT;  Surgeon: Franchot Gallo, MD;  Location[facility];  Service: Urology;  Laterality: N/A;   EUS N/A 12/31/2014   Procedure: UPPER ENDOSCOPIC ULTRASOUND (EUS) LINEAR;  Surgeon: Milus Banister, MD;  [facility];  Service: Endoscopy;  Laterality: N/A;   HARDWARE REMOVAL Left 2013   left ankle   HEMORRHOIDECTOMY WITH HEMORRHOID BANDING     INCISION AND DRAINAGE ABSCESS N/A 05/14/2015   Procedure: DRAINAGE OF INFECTED PANCREATIC PSEUDOCYST;  Surgeon: Armandina Gemma, MD;  Location: [facility];  Service: General;  Laterality: N/A;   INCISION AND DRAINAGE OF WOUND N/A 05/16/2015   Procedure: IRRIGATION AND DEBRIDEMENT WOUND;  Surgeon: Armandina Gemma, MD;  Location: [facility];  Service: General;  Laterality: N/A;   INCISIONAL HERNIA REPAIR N/A 11/12/2015   Procedure: REPAIR INCISIONAL HERNIA WITH MESH;  Surgeon: Armandina Gemma, MD;  Location: [place];  Service: General;  Laterality: N/A;   INGUINAL HERNIA REPAIR Right 1974;  [place] N/A 11/12/2015   Procedure: INSERTION OF MESH;  Surgeon: Armandina Gemma, MD;  Location: [place];  Service: General;  Laterality: N/A;   IR GENERIC HISTORICAL  04/20/2015   IR RADIOLOGIST EVAL & MGMT 04/20/2015 [facility] INTERV RAD   KNEE ARTHROSCOPY Bilateral right 08-08-2000;  left 11-23-2000   meniscal repair and chondroplasty's   KNEE ARTHROSCOPY  03/01/2012   Procedure: ARTHROSCOPY KNEE;  Surgeon: Gearlean Alf, MD;  Location: [facility];  Service: Orthopedics;  Laterality: Left;  WITH SYNOVECTOMY    LAPAROTOMY N/A 05/14/2015   Procedure: EXPLORATORY LAPAROTOMY;  Surgeon: Armandina Gemma, MD;  Location: [facility];  Service:  General;  Laterality: N/A;   MASS EXCISION N/A 12/24/2019   Procedure: EXCISION LIP CARCINOMA;  Surgeon: Leta Baptist, MD;  Location: [place];  Service: ENT;  Laterality: N/A;   ORIF LEFT ANKLE FX  08/20/2009   distal tib-fib and malleolus   ROTATOR CUFF REPAIR Bilateral right 1994; left 1993, left 1993   TOTAL KNEE ARTHROPLASTY Right 06/02/2013   Procedure: RIGHT TOTAL KNEE ARTHROPLASTY;  Surgeon: Gearlean Alf, MD;  [facility];  Service: Orthopedics;  Laterality: Right;   TOTAL KNEE ARTHROPLASTY Left 01-31-2008   dr Wynelle Link   TOTAL KNEE REVISION Right 01/23/2018   Procedure: right knee polyethylene revision;  Surgeon: Gaynelle Arabian, MD;  Location: [facility];  Service: Orthopedics;  Laterality: Right;  9min   TRANSURETHRAL RESECTION OF PROSTATE N/A 04/21/2019   Procedure: TRANSURETHRAL RESECTION OF THE PROSTATE (TURP);  Surgeon: Franchot Gallo, MD;  Location: [facility];  Service: Urology;  Laterality: N/A;   UMBILICAL HERNIA REPAIR  08-11-1999   dr Ninfa Linden   UPPER GASTROINTESTINAL ENDOSCOPY  sept 2016   WOUND EXPLORATION N/A 09/21/2016   Procedure: WOUND EXPLORATION AND DEBRIDEMENT ABDOMINAL WALL;  Surgeon: Armandina Gemma, MD;  Location[facility];  Service: General;  Laterality: N/A;       Family History  Problem Relation Age of Onset   Bone cancer Father        Jaw   Factor V Leiden deficiency Daughter    Diabetes Maternal Grandmother    Heart disease Paternal Uncle    Colon cancer Neg Hx    Esophageal cancer Neg Hx    Stomach cancer Neg Hx    Rectal cancer Neg Hx     Social History   Tobacco Use   Smoking status: Former    Packs/day: 4.00    Years: 17.00    Pack years: 68.00    Types: Cigarettes    Quit date: 02/21/1975    Years since quitting: 45.5   Smokeless tobacco: Never  Vaping Use   Vaping Use: Never used  Substance Use Topics   Alcohol use: No    Alcohol/week: 0.0 standard drinks   Drug use: No    Home Medications Prior to Admission  medications   Medication Sig Start Date End Date Taking? Authorizing Provider  amoxicillin-clavulanate (AUGMENTIN) 875-125 MG tablet Take 1 tablet by mouth every 12 hours 08/16/20  Yes   apixaban (ELIQUIS) 5 MG TABS tablet Take 5 mg by mouth 2 (two) times daily.   Yes [provider]  AUSTRALIAN DREAM ARTHRITIS 0.025 % CREA Apply 1 application topically 3 (three) times daily as needed (to painful joints).   Yes [provider]  carvedilol (COREG) 25 MG tablet Take one tablet by mouth once daily in the evening. Patient taking differently: Take 25 mg by mouth at bedtime. 07/01/20  Yes   furosemide (LASIX) 20 MG tablet Take 20 mg by mouth daily as needed for edema.   Yes [provider]  Homeopathic Products [facility] RELIEF) FOAM Apply 1 application topically daily as needed (for knee cramps).   Yes [provider]  HYDROmorphone (DILAUDID) 8 MG tablet Take 1 tablet by mouth 7 times a day as needed Patient taking differently: Take 8-56 mg by mouth daily. 08/17/20  Yes   insulin aspart (NOVOLOG) 100 UNIT/ML injection USE 100U/DAY SUBCUTANEOUS VIA PUMP 30 DAY(S) Patient taking differently: Inject 5-30 Units into the skin See admin instructions. Inject 5-30 units into the skin three times a day before meals, PER SLIDING SCALE 12/23/19 12/22/20 Yes Balan, Bindubal, MD  insulin degludec (TRESIBA FLEXTOUCH) 100 UNIT/ML FlexTouch Pen Inject 14 units Subcutaneous Once a day 30 days Patient taking differently: Inject 14 Units into the skin daily before breakfast. 07/22/20  Yes   lansoprazole (PREVACID) 30 MG capsule Take one capsule by mouth once daily. Patient taking differently: Take 30 mg by mouth at bedtime. 07/01/20  Yes   losartan (COZAAR) 50 MG tablet Take one tablet by mouth every evening. Patient taking differently: Take 50 mg by mouth at bedtime. 07/01/20  Yes   magnesium oxide (MAG-OX) 400 MG tablet Take 800 mg by mouth at bedtime. Triple complex  Yes [provider]  megestrol (MEGACE) 40 MG tablet Take one tablet by mouth twice daily. Patient taking differently: Take 40 mg by mouth at bedtime. 07/01/20  Yes   polyethylene glycol powder (MIRALAX) 17 GM/SCOOP powder Mix 1 scoop (17g) in 8 ounces of water or juice once daily Patient taking differently: Take 17 g by mouth See admin instructions. Mix 17 grams of powder into 8 ounces of water or juice and drink every morning 08/02/20  Yes   pravastatin (PRAVACHOL) 20 MG tablet Take one tablet by mouth twice weekly. Patient taking differently: Take 20 mg by mouth 2 (two) times a week. 07/01/20  Yes   senna (SENOKOT) 8.6 MG TABS tablet Take 1 tablet by mouth in the morning.   Yes [provider]  triamcinolone cream (KENALOG) 0.1 % Apply 1 application topically daily as needed (Contact dermititis).  07/01/19  Yes [provider]  Turmeric 500 MG CAPS Take 500 mg by mouth at bedtime.   Yes [provider]  COVID-19 mRNA vaccine, Pfizer, 30 MCG/0.3ML injection INJECT AS DIRECTED Patient not taking: No sig reported 01/05/20 01/04/21  Carlyle Basques, MD  Insulin Syringe-Needle U-100 31G X 5/16" 1 ML MISC USE AS DIRECTED TO INJECT 01/19/20 01/18/21  Jacelyn Pi, MD    Allergies    Duloxetine, Doxycycline, Morphine and related, Oxycontin [oxycodone hcl], Quinine, Sertraline, Voltaren [diclofenac sodium], Diclofenac, Doxycycline hyclate, Duloxetine hcl, Gabapentin, Hydrocodone, Oxybutynin, Sertraline hcl, and Tape  Review of Systems   Review of Systems 10 systems reviewed and negative except as per HPI Physical Exam Updated Vital Signs BP 126/81   Pulse 65   Temp 98.3 F (36.8 C) (Oral)   Resp 10   Ht 6' (1.829 m)   Wt 112 kg   SpO2 98%   BMI 33.50 kg/m   Physical Exam Constitutional:      Appearance: He is well-developed.     Comments: Alert no distress.  HENT:     Mouth/Throat:     Mouth: Mucous membranes are moist.     Pharynx: Oropharynx is clear.  Eyes:      Extraocular Movements: Extraocular movements intact.  Neck:     Comments: No mass fullness or tenderness to the left jaw or neck. Cardiovascular:     Rate and Rhythm: Normal rate and regular rhythm.  Pulmonary:     Effort: Pulmonary effort is normal.     Breath sounds: Normal breath sounds.  Abdominal:     General: There is no distension.     Palpations: Abdomen is soft.  Musculoskeletal:     Cervical back: Normal range of motion and neck supple.     Comments: 1-2+ edema bilateral lower extremities symmetric  Skin:    General: Skin is warm and dry.  Neurological:     General: No focal deficit present.     Mental Status: He is alert and oriented to person, place, and time.     Coordination: Coordination normal.    ED Results / Procedures / Treatments   Labs (all labs ordered are listed, but only abnormal results are displayed) Labs Reviewed  CBC WITH DIFFERENTIAL/PLATELET - Abnormal; Notable for the following components:      Result Value   RBC 4.00 (*)    Hemoglobin 11.7 (*)    HCT 34.7 (*)    All other components within normal limits  COMPREHENSIVE METABOLIC PANEL - Abnormal; Notable for the following components:   Sodium 131 (*)    CO2  19 (*)    Glucose, Bld 199 (*)    BUN 26 (*)    Calcium 10.5 (*)    Total Protein 6.1 (*)    Albumin 3.2 (*)    All other components within normal limits  LIPASE, BLOOD  TROPONIN I (HIGH SENSITIVITY)  TROPONIN I (HIGH SENSITIVITY)    EKG EKG Interpretation  Date/Time:  Wednesday August 18 2020 13:12:06 EDT Ventricular Rate:  59 PR Interval:  181 QRS Duration: 122 QT Interval:  413 QTC Calculation: 410 R Axis:   -52 Text Interpretation: Sinus rhythm RBBB and LAFB no sig change from previous Confirmed by Charlesetta Shanks [phone]) on 08/18/2020 3:22:13 PM  Radiology DG Chest 2 View  Result Date: 08/18/2020 CLINICAL DATA:  Chest pain EXAM: CHEST - 2 VIEW COMPARISON:  PET-CT December 15, 2019 FINDINGS: Lungs are clear. Heart  size and pulmonary vascularity are normal. No adenopathy. There is a focal hiatal hernia. There is aortic atherosclerosis. No bone lesions. IMPRESSION: Lungs clear. Heart size normal. Hiatal hernia evident. Aortic Atherosclerosis (ICD10-I70.0). Electronically Signed   By: Lowella Grip III M.D.   On: 08/18/2020 14:56    Procedures Procedures   Medications Ordered in ED Medications - No data to display  ED Course  I have reviewed the triage vital signs and the nursing notes.  Pertinent labs & imaging results that were available during my care of the patient were reviewed by me and considered in my medical decision making (see chart for details).    MDM Rules/Calculators/A&P                       Patient presents with an episode of jaw pain this morning.  It occurred just as he was awakening around noon.  Patient reports he does typically sleep late.  He then rolled over and the pain migrated to his shoulder and upper arm patient does have a lot of underlying musculoskeletal pain in the shoulder.  This time, no objective signs of facial swelling to suggest focal infection.  Consideration for possible anginal equivalent troponin is checked.  First troponin and EKG no signs of acute ischemic event.  Will check second troponin and if stable anticipate discharge.  Patient is now asymptomatic.  He is anticoagulated on Eliquis.  Heart rate blood pressure and oxygen saturation are all normal, careful return precautions reviewed Final Clinical Impression(s) / ED Diagnoses Final diagnoses:  Jaw pain  Severe comorbid illness    Rx / DC Orders ED Discharge Orders     None        Charlesetta Shanks, MD 08/18/20 1605

## 2020-08-18 NOTE — ED Notes (Signed)
Returns from xray. Alert, NAD, calm, interactive, resps e/u. 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

## 2020-08-18 NOTE — ED Triage Notes (Signed)
Here from home by [facility], here for c/o CP. Got worried when radiated to shoulder, and jaw. Always sob and dizzy. Also chronic back, neck and shoulder pain. Takes dilaudid daily. Seen recently at [facility] for DVT, takes eliquis. Given ASA 324mg  and ntg x1 sl w/o relief. H/o "false attacks". Has cardiologist. H/o HTN, and DM. Cbg 212. NSL in place. NSR on monitor. VSS.

## 2020-08-18 NOTE — ED Notes (Signed)
Pt ambulated to RR well

## 2020-08-18 NOTE — ED Provider Notes (Signed)
Troponin is normal and stable at 10.  Asymptomatic.  Discharged in good condition.  It is likely more musculoskeletal type pain.   Lennice Sites, DO 08/18/20 1636

## 2020-08-24 ENCOUNTER — Other Ambulatory Visit (HOSPITAL_BASED_OUTPATIENT_CLINIC_OR_DEPARTMENT_OTHER): Payer: Self-pay

## 2020-08-24 DIAGNOSIS — Z5181 Encounter for therapeutic drug level monitoring: Secondary | ICD-10-CM | POA: Diagnosis not present

## 2020-08-24 DIAGNOSIS — E669 Obesity, unspecified: Secondary | ICD-10-CM | POA: Diagnosis not present

## 2020-08-24 DIAGNOSIS — R300 Dysuria: Secondary | ICD-10-CM | POA: Diagnosis not present

## 2020-08-24 DIAGNOSIS — M545 Low back pain, unspecified: Secondary | ICD-10-CM | POA: Diagnosis not present

## 2020-08-24 DIAGNOSIS — G894 Chronic pain syndrome: Secondary | ICD-10-CM | POA: Diagnosis not present

## 2020-08-24 DIAGNOSIS — G8929 Other chronic pain: Secondary | ICD-10-CM | POA: Diagnosis not present

## 2020-08-24 DIAGNOSIS — R252 Cramp and spasm: Secondary | ICD-10-CM | POA: Diagnosis not present

## 2020-08-24 DIAGNOSIS — R6884 Jaw pain: Secondary | ICD-10-CM | POA: Diagnosis not present

## 2020-08-24 DIAGNOSIS — Z79899 Other long term (current) drug therapy: Secondary | ICD-10-CM | POA: Diagnosis not present

## 2020-08-24 DIAGNOSIS — F410 Panic disorder [episodic paroxysmal anxiety] without agoraphobia: Secondary | ICD-10-CM | POA: Diagnosis not present

## 2020-08-24 DIAGNOSIS — D6851 Activated protein C resistance: Secondary | ICD-10-CM | POA: Diagnosis not present

## 2020-08-24 DIAGNOSIS — M1990 Unspecified osteoarthritis, unspecified site: Secondary | ICD-10-CM | POA: Diagnosis not present

## 2020-08-24 MED ORDER — HYDROXYZINE PAMOATE 25 MG PO CAPS
ORAL_CAPSULE | ORAL | 0 refills | Status: DC
  Filled 2020-08-24: qty 90, 30d supply, fill #0

## 2020-08-25 ENCOUNTER — Other Ambulatory Visit (HOSPITAL_BASED_OUTPATIENT_CLINIC_OR_DEPARTMENT_OTHER): Payer: Self-pay

## 2020-09-02 ENCOUNTER — Other Ambulatory Visit (HOSPITAL_BASED_OUTPATIENT_CLINIC_OR_DEPARTMENT_OTHER): Payer: Self-pay

## 2020-09-06 ENCOUNTER — Other Ambulatory Visit (HOSPITAL_BASED_OUTPATIENT_CLINIC_OR_DEPARTMENT_OTHER): Payer: Self-pay

## 2020-09-06 MED ORDER — FLUOROURACIL 5 % EX CREA
TOPICAL_CREAM | CUTANEOUS | 0 refills | Status: DC
  Filled 2020-09-06: qty 40, 30d supply, fill #0

## 2020-09-08 ENCOUNTER — Other Ambulatory Visit (HOSPITAL_BASED_OUTPATIENT_CLINIC_OR_DEPARTMENT_OTHER): Payer: Self-pay

## 2020-09-13 ENCOUNTER — Other Ambulatory Visit (HOSPITAL_BASED_OUTPATIENT_CLINIC_OR_DEPARTMENT_OTHER): Payer: Self-pay

## 2020-09-13 DIAGNOSIS — E1065 Type 1 diabetes mellitus with hyperglycemia: Secondary | ICD-10-CM | POA: Diagnosis not present

## 2020-09-13 MED ORDER — ELIQUIS 5 MG PO TABS
ORAL_TABLET | ORAL | 0 refills | Status: DC
  Filled 2020-09-13: qty 180, 90d supply, fill #0

## 2020-09-14 ENCOUNTER — Other Ambulatory Visit (HOSPITAL_BASED_OUTPATIENT_CLINIC_OR_DEPARTMENT_OTHER): Payer: Self-pay

## 2020-09-14 DIAGNOSIS — Z79899 Other long term (current) drug therapy: Secondary | ICD-10-CM | POA: Diagnosis not present

## 2020-09-14 DIAGNOSIS — Z5181 Encounter for therapeutic drug level monitoring: Secondary | ICD-10-CM | POA: Diagnosis not present

## 2020-09-14 DIAGNOSIS — G894 Chronic pain syndrome: Secondary | ICD-10-CM | POA: Diagnosis not present

## 2020-09-14 MED ORDER — HYDROMORPHONE HCL 8 MG PO TABS
ORAL_TABLET | ORAL | 0 refills | Status: DC
  Filled 2020-09-14: qty 210, 30d supply, fill #0

## 2020-09-15 ENCOUNTER — Other Ambulatory Visit (HOSPITAL_BASED_OUTPATIENT_CLINIC_OR_DEPARTMENT_OTHER): Payer: Self-pay

## 2020-09-15 DIAGNOSIS — M189 Osteoarthritis of first carpometacarpal joint, unspecified: Secondary | ICD-10-CM | POA: Diagnosis not present

## 2020-09-15 DIAGNOSIS — E1169 Type 2 diabetes mellitus with other specified complication: Secondary | ICD-10-CM | POA: Diagnosis not present

## 2020-09-15 DIAGNOSIS — F339 Major depressive disorder, recurrent, unspecified: Secondary | ICD-10-CM | POA: Diagnosis not present

## 2020-09-15 DIAGNOSIS — I10 Essential (primary) hypertension: Secondary | ICD-10-CM | POA: Diagnosis not present

## 2020-09-15 DIAGNOSIS — E119 Type 2 diabetes mellitus without complications: Secondary | ICD-10-CM | POA: Diagnosis not present

## 2020-09-15 DIAGNOSIS — E7800 Pure hypercholesterolemia, unspecified: Secondary | ICD-10-CM | POA: Diagnosis not present

## 2020-09-15 DIAGNOSIS — M1990 Unspecified osteoarthritis, unspecified site: Secondary | ICD-10-CM | POA: Diagnosis not present

## 2020-09-15 DIAGNOSIS — K219 Gastro-esophageal reflux disease without esophagitis: Secondary | ICD-10-CM | POA: Diagnosis not present

## 2020-09-15 DIAGNOSIS — E1165 Type 2 diabetes mellitus with hyperglycemia: Secondary | ICD-10-CM | POA: Diagnosis not present

## 2020-09-20 DIAGNOSIS — M25541 Pain in joints of right hand: Secondary | ICD-10-CM | POA: Diagnosis not present

## 2020-09-20 DIAGNOSIS — M25531 Pain in right wrist: Secondary | ICD-10-CM | POA: Diagnosis not present

## 2020-09-20 DIAGNOSIS — R252 Cramp and spasm: Secondary | ICD-10-CM | POA: Diagnosis not present

## 2020-09-20 DIAGNOSIS — M13831 Other specified arthritis, right wrist: Secondary | ICD-10-CM | POA: Diagnosis not present

## 2020-09-22 ENCOUNTER — Other Ambulatory Visit (HOSPITAL_BASED_OUTPATIENT_CLINIC_OR_DEPARTMENT_OTHER): Payer: Self-pay

## 2020-09-22 DIAGNOSIS — R311 Benign essential microscopic hematuria: Secondary | ICD-10-CM | POA: Diagnosis not present

## 2020-09-22 DIAGNOSIS — R310 Gross hematuria: Secondary | ICD-10-CM | POA: Diagnosis not present

## 2020-09-22 DIAGNOSIS — R3914 Feeling of incomplete bladder emptying: Secondary | ICD-10-CM | POA: Diagnosis not present

## 2020-09-22 MED ORDER — DOXYCYCLINE HYCLATE 100 MG PO TABS
ORAL_TABLET | ORAL | 0 refills | Status: DC
  Filled 2020-09-22: qty 20, 10d supply, fill #0

## 2020-10-01 ENCOUNTER — Other Ambulatory Visit (HOSPITAL_BASED_OUTPATIENT_CLINIC_OR_DEPARTMENT_OTHER): Payer: Self-pay

## 2020-10-01 DIAGNOSIS — R310 Gross hematuria: Secondary | ICD-10-CM | POA: Diagnosis not present

## 2020-10-01 DIAGNOSIS — N401 Enlarged prostate with lower urinary tract symptoms: Secondary | ICD-10-CM | POA: Diagnosis not present

## 2020-10-01 DIAGNOSIS — N3000 Acute cystitis without hematuria: Secondary | ICD-10-CM | POA: Diagnosis not present

## 2020-10-01 MED ORDER — FINASTERIDE 5 MG PO TABS
ORAL_TABLET | ORAL | 3 refills | Status: DC
  Filled 2020-10-01: qty 90, 90d supply, fill #0

## 2020-10-01 MED ORDER — NITROFURANTOIN MONOHYD MACRO 100 MG PO CAPS
ORAL_CAPSULE | ORAL | 0 refills | Status: DC
  Filled 2020-10-01: qty 30, 30d supply, fill #0

## 2020-10-01 MED ORDER — PHENAZOPYRIDINE HCL 200 MG PO TABS
ORAL_TABLET | ORAL | 1 refills | Status: DC
  Filled 2020-10-01: qty 60, 20d supply, fill #0
  Filled 2020-11-15: qty 60, 20d supply, fill #1

## 2020-10-04 ENCOUNTER — Other Ambulatory Visit (HOSPITAL_BASED_OUTPATIENT_CLINIC_OR_DEPARTMENT_OTHER): Payer: Self-pay

## 2020-10-07 ENCOUNTER — Other Ambulatory Visit (HOSPITAL_BASED_OUTPATIENT_CLINIC_OR_DEPARTMENT_OTHER): Payer: Self-pay

## 2020-10-07 MED ORDER — TRIAMCINOLONE ACETONIDE 0.1 % EX CREA
1.0000 "application " | TOPICAL_CREAM | Freq: Two times a day (BID) | CUTANEOUS | 3 refills | Status: DC
  Filled 2020-10-07: qty 75, 30d supply, fill #0

## 2020-10-08 ENCOUNTER — Other Ambulatory Visit (HOSPITAL_BASED_OUTPATIENT_CLINIC_OR_DEPARTMENT_OTHER): Payer: Self-pay

## 2020-10-09 MED FILL — Insulin Syringe/Needle U-100 1 ML 31 x 5/16": 90 days supply | Qty: 200 | Fill #1 | Status: CN

## 2020-10-11 ENCOUNTER — Other Ambulatory Visit (HOSPITAL_BASED_OUTPATIENT_CLINIC_OR_DEPARTMENT_OTHER): Payer: Self-pay

## 2020-10-11 MED ORDER — INSULIN ASPART 100 UNIT/ML IJ SOLN
INTRAMUSCULAR | 6 refills | Status: DC
  Filled 2020-10-11: qty 30, 30d supply, fill #0

## 2020-10-11 MED FILL — Insulin Syringe/Needle U-100 1 ML 31 x 5/16": 45 days supply | Qty: 100 | Fill #1 | Status: CN

## 2020-10-11 MED FILL — Insulin Syringe/Needle U-100 1 ML 31 x 5/16": 45 days supply | Qty: 100 | Fill #1 | Status: AC

## 2020-10-12 DIAGNOSIS — E109 Type 1 diabetes mellitus without complications: Secondary | ICD-10-CM | POA: Diagnosis not present

## 2020-10-14 ENCOUNTER — Other Ambulatory Visit (HOSPITAL_BASED_OUTPATIENT_CLINIC_OR_DEPARTMENT_OTHER): Payer: Self-pay

## 2020-10-14 DIAGNOSIS — E1065 Type 1 diabetes mellitus with hyperglycemia: Secondary | ICD-10-CM | POA: Diagnosis not present

## 2020-10-14 MED ORDER — HYDROMORPHONE HCL 8 MG PO TABS
ORAL_TABLET | ORAL | 0 refills | Status: DC
  Filled 2020-10-14: qty 210, 30d supply, fill #0

## 2020-10-19 DIAGNOSIS — E119 Type 2 diabetes mellitus without complications: Secondary | ICD-10-CM | POA: Diagnosis not present

## 2020-10-19 DIAGNOSIS — E7800 Pure hypercholesterolemia, unspecified: Secondary | ICD-10-CM | POA: Diagnosis not present

## 2020-10-19 DIAGNOSIS — E1169 Type 2 diabetes mellitus with other specified complication: Secondary | ICD-10-CM | POA: Diagnosis not present

## 2020-10-19 DIAGNOSIS — K219 Gastro-esophageal reflux disease without esophagitis: Secondary | ICD-10-CM | POA: Diagnosis not present

## 2020-10-19 DIAGNOSIS — M189 Osteoarthritis of first carpometacarpal joint, unspecified: Secondary | ICD-10-CM | POA: Diagnosis not present

## 2020-10-19 DIAGNOSIS — I10 Essential (primary) hypertension: Secondary | ICD-10-CM | POA: Diagnosis not present

## 2020-10-19 DIAGNOSIS — F339 Major depressive disorder, recurrent, unspecified: Secondary | ICD-10-CM | POA: Diagnosis not present

## 2020-10-19 DIAGNOSIS — E1165 Type 2 diabetes mellitus with hyperglycemia: Secondary | ICD-10-CM | POA: Diagnosis not present

## 2020-10-20 DIAGNOSIS — M545 Low back pain, unspecified: Secondary | ICD-10-CM | POA: Diagnosis not present

## 2020-10-20 DIAGNOSIS — G894 Chronic pain syndrome: Secondary | ICD-10-CM | POA: Diagnosis not present

## 2020-10-20 DIAGNOSIS — G8929 Other chronic pain: Secondary | ICD-10-CM | POA: Diagnosis not present

## 2020-10-22 DIAGNOSIS — M7542 Impingement syndrome of left shoulder: Secondary | ICD-10-CM | POA: Diagnosis not present

## 2020-10-27 ENCOUNTER — Other Ambulatory Visit (HOSPITAL_BASED_OUTPATIENT_CLINIC_OR_DEPARTMENT_OTHER): Payer: Self-pay

## 2020-11-01 ENCOUNTER — Other Ambulatory Visit (HOSPITAL_BASED_OUTPATIENT_CLINIC_OR_DEPARTMENT_OTHER): Payer: Self-pay

## 2020-11-01 MED ORDER — CLINDAMYCIN HCL 150 MG PO CAPS
ORAL_CAPSULE | ORAL | 0 refills | Status: DC
  Filled 2020-11-01: qty 22, 7d supply, fill #0

## 2020-11-02 ENCOUNTER — Other Ambulatory Visit (HOSPITAL_BASED_OUTPATIENT_CLINIC_OR_DEPARTMENT_OTHER): Payer: Self-pay

## 2020-11-02 DIAGNOSIS — Z85819 Personal history of malignant neoplasm of unspecified site of lip, oral cavity, and pharynx: Secondary | ICD-10-CM | POA: Diagnosis not present

## 2020-11-07 ENCOUNTER — Emergency Department (HOSPITAL_BASED_OUTPATIENT_CLINIC_OR_DEPARTMENT_OTHER)
Admission: EM | Admit: 2020-11-07 | Discharge: 2020-11-07 | Disposition: A | Discharge status: Home / Self Care | Payer: HMO | Attending: Emergency Medicine | Admitting: Emergency Medicine

## 2020-11-07 ENCOUNTER — Emergency Department (HOSPITAL_BASED_OUTPATIENT_CLINIC_OR_DEPARTMENT_OTHER): Payer: HMO

## 2020-11-07 ENCOUNTER — Encounter (HOSPITAL_BASED_OUTPATIENT_CLINIC_OR_DEPARTMENT_OTHER): Payer: Self-pay | Admitting: *Deleted

## 2020-11-07 ENCOUNTER — Other Ambulatory Visit: Payer: Self-pay

## 2020-11-07 DIAGNOSIS — K219 Gastro-esophageal reflux disease without esophagitis: Secondary | ICD-10-CM | POA: Diagnosis not present

## 2020-11-07 DIAGNOSIS — K469 Unspecified abdominal hernia without obstruction or gangrene: Secondary | ICD-10-CM | POA: Diagnosis not present

## 2020-11-07 DIAGNOSIS — I82401 Acute embolism and thrombosis of unspecified deep veins of right lower extremity: Secondary | ICD-10-CM | POA: Diagnosis not present

## 2020-11-07 DIAGNOSIS — I82411 Acute embolism and thrombosis of right femoral vein: Secondary | ICD-10-CM | POA: Insufficient documentation

## 2020-11-07 DIAGNOSIS — L02224 Furuncle of groin: Secondary | ICD-10-CM | POA: Insufficient documentation

## 2020-11-07 DIAGNOSIS — I10 Essential (primary) hypertension: Secondary | ICD-10-CM | POA: Diagnosis not present

## 2020-11-07 DIAGNOSIS — Z79899 Other long term (current) drug therapy: Secondary | ICD-10-CM | POA: Insufficient documentation

## 2020-11-07 DIAGNOSIS — R1013 Epigastric pain: Secondary | ICD-10-CM | POA: Insufficient documentation

## 2020-11-07 DIAGNOSIS — R2241 Localized swelling, mass and lump, right lower limb: Secondary | ICD-10-CM | POA: Diagnosis present

## 2020-11-07 DIAGNOSIS — R112 Nausea with vomiting, unspecified: Secondary | ICD-10-CM | POA: Insufficient documentation

## 2020-11-07 DIAGNOSIS — N200 Calculus of kidney: Secondary | ICD-10-CM | POA: Diagnosis not present

## 2020-11-07 DIAGNOSIS — E119 Type 2 diabetes mellitus without complications: Secondary | ICD-10-CM | POA: Diagnosis not present

## 2020-11-07 DIAGNOSIS — Z794 Long term (current) use of insulin: Secondary | ICD-10-CM | POA: Insufficient documentation

## 2020-11-07 DIAGNOSIS — Z87891 Personal history of nicotine dependence: Secondary | ICD-10-CM | POA: Diagnosis not present

## 2020-11-07 DIAGNOSIS — I82811 Embolism and thrombosis of superficial veins of right lower extremities: Secondary | ICD-10-CM | POA: Diagnosis not present

## 2020-11-07 DIAGNOSIS — M7989 Other specified soft tissue disorders: Secondary | ICD-10-CM | POA: Diagnosis not present

## 2020-11-07 LAB — CBC WITH DIFFERENTIAL/PLATELET
Abs Immature Granulocytes: 0.03 10*3/uL (ref 0.00–0.07)
Basophils Absolute: 0.1 10*3/uL (ref 0.0–0.1)
Basophils Relative: 1 %
Eosinophils Absolute: 0.3 10*3/uL (ref 0.0–0.5)
Eosinophils Relative: 3 %
HCT: 35.9 % — ABNORMAL LOW (ref 39.0–52.0)
Hemoglobin: 12 g/dL — ABNORMAL LOW (ref 13.0–17.0)
Immature Granulocytes: 0 %
Lymphocytes Relative: 23 %
Lymphs Abs: 2.3 10*3/uL (ref 0.7–4.0)
MCH: 29.8 pg (ref 26.0–34.0)
MCHC: 33.4 g/dL (ref 30.0–36.0)
MCV: 89.1 fL (ref 80.0–100.0)
Monocytes Absolute: 1.3 10*3/uL — ABNORMAL HIGH (ref 0.1–1.0)
Monocytes Relative: 14 %
Neutro Abs: 5.9 10*3/uL (ref 1.7–7.7)
Neutrophils Relative %: 59 %
Platelets: 350 10*3/uL (ref 150–400)
RBC: 4.03 MIL/uL — ABNORMAL LOW (ref 4.22–5.81)
RDW: 13.7 % (ref 11.5–15.5)
WBC: 9.9 10*3/uL (ref 4.0–10.5)
nRBC: 0 % (ref 0.0–0.2)

## 2020-11-07 LAB — COMPREHENSIVE METABOLIC PANEL
ALT: 29 U/L (ref 0–44)
AST: 74 U/L — ABNORMAL HIGH (ref 15–41)
Albumin: 3.5 g/dL (ref 3.5–5.0)
Alkaline Phosphatase: 64 U/L (ref 38–126)
Anion gap: 6 (ref 5–15)
BUN: 27 mg/dL — ABNORMAL HIGH (ref 8–23)
CO2: 23 mmol/L (ref 22–32)
Calcium: 10.8 mg/dL — ABNORMAL HIGH (ref 8.9–10.3)
Chloride: 103 mmol/L (ref 98–111)
Creatinine, Ser: 0.9 mg/dL (ref 0.61–1.24)
GFR, Estimated: >60 mL/min (ref >60)
Glucose, Bld: 231 mg/dL — ABNORMAL HIGH (ref 70–99)
Potassium: 4.7 mmol/L (ref 3.5–5.1)
Sodium: 132 mmol/L — ABNORMAL LOW (ref 135–145)
Total Bilirubin: 0.9 mg/dL (ref 0.3–1.2)
Total Protein: 6.5 g/dL (ref 6.5–8.1)

## 2020-11-07 LAB — LIPASE, BLOOD: Lipase: 23 U/L (ref 11–51)

## 2020-11-07 MED ORDER — HYDROMORPHONE HCL 1 MG/ML IJ SOLN
0.5000 mg | Freq: Once | INTRAMUSCULAR | Status: AC
  Administered 2020-11-07: 0.5 mg via INTRAVENOUS
  Filled 2020-11-07: qty 1

## 2020-11-07 NOTE — ED Provider Notes (Signed)
[facility] EMERGENCY DEPARTMENT Provider Note   CSN: [id number] Arrival date & time: 11/07/20  1659     History Chief Complaint  Patient presents with   Groin Pain   Leg Swelling    Derek [name] is a 75 y.o. male.  Patient with complaints of right groin pain and right leg swelling.  Right groin and right upper thigh pain started about 3 days ago.  It is progressively worsened since then.  He develops nausea and vomiting today for the first time.  Leg is tender to the touch with no redness or skin changes.  Pain is worse with leg movement.  Patient denies any trauma to the area.  Patient denies any worsening shortness of breath or chest pain from baseline.  He does have chronic shortness of breath and chest pain but it is unchanged.  No abdominal pain.  No fever or chills.  No new cough or hemoptysis.  Patient has a history of a DVT and his doctor took him off of his blood thinner approximately 2 weeks ago.  Asked patient why they did this and he decided his doctor told him to.   Groin Pain Pertinent negatives include no chest pain, no abdominal pain, no headaches and no shortness of breath.      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" no pulmonary md, sees dr [name]   Factor V Leiden mutation ([place])    "never had blood clots"   GERD (gastroesophageal reflux disease)    Hiatal hernia    History of kidney stones    multiple kidney stones-not a problem at present   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, pT1 pNX) no  further treatment   History of panic attacks    History of traumatic head injury    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   Foreign body of abdominal wall 09/04/2016   Ventral incisional hernia 11/12/2015   Incisional hernia 11/10/2015   Infected pancreatic pseudocyst  05/13/2015   Pleural effusion on left    Lactose intolerance in adult 04/04/2015   Protein-calorie malnutrition, moderate (HCC) 04/04/2015   Pleural effusion, left    Debility    Benign essential HTN    Leukocytosis    Thrombocytosis    Intra-abdominal abscess (HCC)    Poorly controlled type 2 diabetes mellitus ([place])    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 02/24/2015   OA (osteoarthritis) of knee 06/02/2013   Anal fissure 02/04/2013   Meniere's disease    GERD (gastroesophageal reflux disease)    Kidney stone    Obstructive sleep apnea    Morbid obesity ([place])    Synovitis of knee 03/01/2012   Chronic cough 11/03/2011   COSTOCHONDRITIS, LEFT 10/08/2009   RIB PAIN, LEFT SIDED 10/08/2009    Past Surgical History:  Procedure Laterality Date   ANAL FISTULECTOMY  N/A 04/01/2013   Procedure: EXAM UNDER ANESTHESIA WITH ANAL FISSURectomy, sphincterotomy and internal hemorrhoidectomy;  Surgeon: Harl Bowie, MD;  Location: [facility];  Service: General;  Laterality: N/A;   APPLICATION OF WOUND VAC N/A 05/16/2015   Procedure: APPLICATION OF WOUND VAC;  Surgeon: Armandina Gemma, MD;  Location: [facility];  Service: General;  Laterality: N/A;   APPLICATION OF WOUND VAC N/A 05/20/2015   Procedure: EXHANGE OF ABDOMINAL  WOUND VAC DRESSING;  Surgeon: Armandina Gemma, MD;  Location: [facility];  Service: General;  Laterality: N/A;   COLONOSCOPY     CYSTOSCOPY WITH INSERTION OF UROLIFT N/A 10/12/2016   Procedure: CYSTOSCOPY WITH INSERTION OF UROLIFT;  Surgeon: Franchot Gallo, MD;  Location: [facility];  Service: Urology;  Laterality: N/A;   EUS N/A 12/31/2014   Procedure: UPPER ENDOSCOPIC ULTRASOUND (EUS) LINEAR;  Surgeon: Milus Banister, MD;  Location[facility];  Service: Endoscopy;  Laterality: N/A;   HARDWARE REMOVAL Left 2013   left ankle   HEMORRHOIDECTOMY WITH HEMORRHOID BANDING     INCISION AND DRAINAGE ABSCESS N/A 05/14/2015   Procedure: DRAINAGE OF INFECTED PANCREATIC PSEUDOCYST;  Surgeon: Armandina Gemma, MD;  Location: [facility];  Service: General;  Laterality: N/A;   INCISION AND DRAINAGE OF WOUND N/A 05/16/2015   Procedure: IRRIGATION AND DEBRIDEMENT WOUND;  Surgeon: Armandina Gemma, MD;  Location: [facility];  Service: General;  Laterality: N/A;   INCISIONAL HERNIA REPAIR N/A 11/12/2015   Procedure: REPAIR INCISIONAL HERNIA WITH MESH;  Surgeon: Armandina Gemma, MD;  Location: [facility];  Service: General;  Laterality: N/A;   INGUINAL HERNIA REPAIR Right 1974;  [place] N/A 11/12/2015   Procedure: INSERTION OF MESH;  Surgeon: Armandina Gemma, MD;  Location: [place] [facility];  Service: General;  Laterality: N/A;   IR GENERIC HISTORICAL  04/20/2015   IR RADIOLOGIST EVAL & MGMT 04/20/2015 [facility] INTERV RAD   KNEE ARTHROSCOPY Bilateral right 08-08-2000;   left 11-23-2000   meniscal repair and chondroplasty's   KNEE ARTHROSCOPY  03/01/2012   Procedure: ARTHROSCOPY KNEE;  Surgeon: Gearlean Alf, MD;  Location: [facility];  Service: Orthopedics;  Laterality: Left;  WITH SYNOVECTOMY    LAPAROTOMY N/A 05/14/2015   Procedure: EXPLORATORY LAPAROTOMY;  Surgeon: Armandina Gemma, MD;  Location: [facility];  Service: General;  Laterality: N/A;   MASS EXCISION N/A 12/24/2019   Procedure: EXCISION LIP CARCINOMA;  Surgeon: Leta Baptist, MD;  Location: [facility];  Service: ENT;  Laterality: N/A;   ORIF LEFT ANKLE FX  08/20/2009   distal tib-fib and malleolus  ROTATOR CUFF REPAIR Bilateral right 1994; left 1993, left [place] Right 06/02/2013   Procedure: RIGHT TOTAL KNEE ARTHROPLASTY;  Surgeon: Gearlean Alf, MD;  Location[facility];  Service: Orthopedics;  Laterality: Right;   TOTAL KNEE ARTHROPLASTY Left 01-31-2008   dr Wynelle Link   TOTAL KNEE REVISION Right 01/23/2018   Procedure: right knee polyethylene revision;  Surgeon: Gaynelle Arabian, MD;  Location: [facility];  Service: Orthopedics;  Laterality: Right;  46mn   TRANSURETHRAL RESECTION OF PROSTATE N/A 04/21/2019   Procedure: TRANSURETHRAL RESECTION OF THE PROSTATE (TURP);  Surgeon: DFranchot Gallo MD;  [facility]  Service: Urology;  Laterality: N/A;   UMBILICAL HERNIA REPAIR  08-11-1999   dr bNinfa Linden  UPPER GASTROINTESTINAL ENDOSCOPY  sept 2016   WOUND EXPLORATION N/A 09/21/2016   Procedure: WOUND EXPLORATION AND DEBRIDEMENT ABDOMINAL WALL;  Surgeon: GArmandina Gemma MD;  [facility];  Service: General;  Laterality: N/A;       Family History  Problem Relation Age of Onset   Bone cancer Father        Jaw   Factor V Leiden deficiency Daughter    Diabetes Maternal Grandmother    Heart disease Paternal Uncle    Colon cancer Neg Hx    Esophageal cancer Neg Hx    Stomach cancer Neg Hx    Rectal cancer Neg Hx     Social History   Tobacco Use    Smoking status: Former    Packs/day: 4.00    Years: 17.00    Pack years: 68.00    Types: Cigarettes    Quit date: 02/21/1975    Years since quitting: 45.7   Smokeless tobacco: Never  Vaping Use   Vaping Use: Never used  Substance Use Topics   Alcohol use: No    Alcohol/week: 0.0 standard drinks   Drug use: No    Home Medications Prior to Admission medications   Medication Sig Start Date End Date Taking? Authorizing Provider  amoxicillin-clavulanate (AUGMENTIN) 875-125 MG tablet Take 1 tablet by mouth every 12 hours 08/16/20  Yes   carvedilol (COREG) 25 MG tablet Take one tablet by mouth once daily in the evening. Patient taking differently: Take 25 mg by mouth at bedtime. 07/01/20  Yes   clindamycin (CLEOCIN) 150 MG capsule Take 2 capsules by mouth now then take 1 capsule 3 times a day until gone 11/01/20  Yes   doxycycline (VIBRA-TABS) 100 MG tablet Take 1 tablet by mouth twice a day 09/22/20  Yes   finasteride (PROSCAR) 5 MG tablet Take 1 tablet by mouth once daily 10/01/20  Yes   fluorouracil (EFUDEX) 5 % cream Apply 1 application externally twice a day 09/06/20  Yes   furosemide (LASIX) 20 MG tablet Take 20 mg by mouth daily as needed for edema.   Yes [provider]  apixaban (ELIQUIS) 5 MG TABS tablet Take 5 mg by mouth 2 (two) times daily.    [provider]  apixaban (ELIQUIS) 5 MG TABS tablet Take 1 tablet by mouth twice a day 09/13/20     AUSTRALIAN DREAM ARTHRITIS 0.025 % CREA Apply 1 application topically 3 (three) times daily as needed (to painful joints).    [provider]  COVID-19 mRNA vaccine, Pfizer, 30 MCG/0.3ML injection INJECT AS DIRECTED Patient not taking: No sig reported 01/05/20 01/04/21  SCarlyle Basques MD  Homeopathic Products (TPioneer FOAM Apply 1 application topically daily as needed (for knee cramps).  [provider]  HYDROmorphone (DILAUDID) 8 MG tablet Take 1 tablet by mouth 7 times a day as needed 10/14/20      hydrOXYzine (VISTARIL) 25 MG capsule Take 1 capsule by mouth every 8 hours as needed for stress 08/24/20     insulin aspart (NOVOLOG) 100 UNIT/ML injection USE 100U/DAY SUBCUTANEOUS VIA PUMP 30 DAY(S) Patient taking differently: Inject 5-30 Units into the skin See admin instructions. Inject 5-30 units into the skin three times a day before meals, PER SLIDING SCALE 12/23/19 12/22/20  Jacelyn Pi, MD  insulin aspart (NOVOLOG) 100 UNIT/ML injection Use 100 units daily under the skin via pump 10/11/20     insulin degludec (TRESIBA FLEXTOUCH) 100 UNIT/ML FlexTouch Pen Inject 14 units Subcutaneous Once a day 30 days Patient taking differently: Inject 14 Units into the skin daily before breakfast. 07/22/20     Insulin Syringe-Needle U-100 31G X 5/16" 1 ML MISC USE AS DIRECTED TO INJECT 01/19/20 01/18/21  Jacelyn Pi, MD  lansoprazole (PREVACID) 30 MG capsule Take one capsule by mouth once daily. Patient taking differently: Take 30 mg by mouth at bedtime. 07/01/20     losartan (COZAAR) 50 MG tablet Take one tablet by mouth every evening. Patient taking differently: Take 50 mg by mouth at bedtime. 07/01/20     magnesium oxide (MAG-OX) 400 MG tablet Take 800 mg by mouth at bedtime. Triple complex    [provider]  megestrol (MEGACE) 40 MG tablet Take one tablet by mouth twice daily. Patient taking differently: Take 40 mg by mouth at bedtime. 07/01/20     nitrofurantoin, macrocrystal-monohydrate, (MACROBID) 100 MG capsule Take 1 capsule by mouth once daily 10/01/20     phenazopyridine (PYRIDIUM) 200 MG tablet Take 1 tablet by mouth every 8 hours as needed for urinary pain 10/01/20     polyethylene glycol powder (MIRALAX) 17 GM/SCOOP powder Mix 1 scoop (17g) in 8 ounces of water or juice once daily Patient taking differently: Take 17 g by mouth See admin instructions. Mix 17 grams of powder into 8 ounces of water or juice and drink every morning 08/02/20     pravastatin (PRAVACHOL) 20 MG tablet Take  one tablet by mouth twice weekly. Patient taking differently: Take 20 mg by mouth 2 (two) times a week. 07/01/20     senna (SENOKOT) 8.6 MG TABS tablet Take 1 tablet by mouth in the morning.    [provider]  triamcinolone cream (KENALOG) 0.1 % Apply 1 application topically daily as needed (Contact dermititis).  07/01/19   [provider]  triamcinolone cream (KENALOG) 0.1 % Apply externally two times a day for 30 days. 10/07/20     Turmeric 500 MG CAPS Take 500 mg by mouth at bedtime.    [provider]    Allergies    Duloxetine, Doxycycline, Morphine and related, Oxycontin [oxycodone hcl], Quinine, Sertraline, Voltaren [diclofenac sodium], Diclofenac, Doxycycline hyclate, Duloxetine hcl, Gabapentin, Hydrocodone, Oxybutynin, Sertraline hcl, and Tape  Review of Systems   Review of Systems  Constitutional:  Negative for chills and fever.  HENT:  Negative for congestion and rhinorrhea.   Eyes:  Negative for visual disturbance.  Respiratory:  Negative for cough, chest tightness and shortness of breath.   Cardiovascular:  Positive for leg swelling. Negative for chest pain and palpitations.  Gastrointestinal:  Negative for abdominal pain, constipation, diarrhea, nausea and vomiting.  Genitourinary:  Negative for difficulty urinating and dysuria.  Musculoskeletal:  Negative for back pain.  Left upper and lower leg pain  Skin:  Negative for rash and wound.  Neurological:  Negative for dizziness, syncope, weakness, light-headedness and headaches.  All other systems reviewed and are negative.  Physical Exam Updated Vital Signs BP 130/75 (BP Location: Right Arm)   Pulse 75   Temp 99.1 F (37.3 C) (Oral)   Resp 14   Ht 6' (1.829 m)   Wt 117 kg   SpO2 98%   BMI 34.99 kg/m   Physical Exam Vitals and nursing note reviewed. Exam conducted with a chaperone present.  Constitutional:      General: He is not in acute distress.    Appearance: Normal appearance.  He is obese. He is ill-appearing. He is not toxic-appearing or diaphoretic.     Comments: Chronically ill appearing  HENT:     Head: Normocephalic and atraumatic.  Eyes:     General: No scleral icterus.       Right eye: No discharge.        Left eye: No discharge.     Conjunctiva/sclera: Conjunctivae normal.  Cardiovascular:     Rate and Rhythm: Normal rate and regular rhythm.     Pulses: Normal pulses.     Heart sounds: Normal heart sounds, S1 normal and S2 normal. No murmur heard.   No friction rub. No gallop.  Pulmonary:     Effort: Pulmonary effort is normal. No respiratory distress.     Breath sounds: Normal breath sounds. No wheezing, rhonchi or rales.     Comments: Tachypnea at baseline Abdominal:     General: Abdomen is flat. Bowel sounds are normal. There is no distension.     Palpations: Abdomen is soft. There is no pulsatile mass.     Tenderness: There is abdominal tenderness in the epigastric area. There is no guarding or rebound.     Hernia: A hernia is present. Hernia is present in the ventral area.     Comments: Old abdominal scarring from previous surgery. Tenderness to palpation of epigastric region, however this is not unusual for patient due to hernia  Genitourinary:    Penis: Normal.      Testes: Normal.        Right: Mass or tenderness not present.        Left: Mass or tenderness not present.  Musculoskeletal:     Right lower leg: Edema present.     Left lower leg: Edema present.     Comments: Right leg > Left leg  Skin:    General: Skin is warm and dry.     Coloration: Skin is not jaundiced.     Findings: No bruising, erythema, lesion or rash.  Neurological:     General: No focal deficit present.     Mental Status: He is alert and oriented to person, place, and time.  Psychiatric:        Mood and Affect: Mood normal.        Behavior: Behavior normal.    ED Results / Procedures / Treatments   Labs (all labs ordered are listed, but only abnormal  results are displayed) Labs Reviewed  COMPREHENSIVE METABOLIC PANEL - Abnormal; Notable for the following components:      Result Value   Sodium 132 (*)    Glucose, Bld 231 (*)    BUN 27 (*)    Calcium 10.8 (*)    AST 74 (*)    All other components within normal limits  CBC WITH DIFFERENTIAL/PLATELET - Abnormal; Notable  for the following components:   RBC 4.03 (*)    Hemoglobin 12.0 (*)    HCT 35.9 (*)    Monocytes Absolute 1.3 (*)    All other components within normal limits  LIPASE, BLOOD    EKG EKG Interpretation  Date/Time:  Sunday November 07 2020 17:46:51 EDT Ventricular Rate:  67 PR Interval:    QRS Duration: 117 QT Interval:  393 QTC Calculation: 418 R Axis:   -51 Text Interpretation: Sinus rhythm Incomplete RBBB and LAFB Low voltage, precordial leads Artifact in lead(s) I II III aVR aVL aVF No significant change since last tracing Confirmed by Blanchie Dessert [phone]) on 11/07/2020 6:21:11 PM  Radiology US Venous Img Lower Right (DVT Study)  Result Date: 11/07/2020 CLINICAL DATA:  Right lower extremity swelling EXAM: RIGHT LOWER EXTREMITY VENOUS DOPPLER ULTRASOUND TECHNIQUE: Gray-scale sonography with graded compression, as well as color Doppler and duplex ultrasound were performed to evaluate the lower extremity deep venous systems from the level of the common femoral vein and including the common femoral, femoral, profunda femoral, popliteal and calf veins including the posterior tibial, peroneal and gastrocnemius veins when visible. The superficial great saphenous vein was also interrogated. Spectral Doppler was utilized to evaluate flow at rest and with distal augmentation maneuvers in the common femoral, femoral and popliteal veins. COMPARISON:  None. FINDINGS: Contralateral Common Femoral Vein: Respiratory phasicity is normal and symmetric with the symptomatic side. No evidence of thrombus. Normal compressibility. Common Femoral Vein: Nonocclusive thrombus with  decreased compressibility Saphenofemoral Junction: No evidence of thrombus. Normal compressibility and flow on color Doppler imaging. Profunda Femoral Vein: No evidence of thrombus. Normal compressibility and flow on color Doppler imaging. Femoral Vein: Nonocclusive thrombus Popliteal Vein: No evidence of thrombus. Normal compressibility, respiratory phasicity and response to augmentation. Calf Veins: No evidence of thrombus. Normal compressibility and flow on color Doppler imaging. Superficial Great Saphenous Vein: thrombus in the mid to distal thigh Venous Reflux:  None. Other Findings:  None. IMPRESSION: 1. Nonocclusive deep venous thrombosis in the right common femoral and femoral veins. 2. Acute superficial greater saphenous vein thrombosis in the mid to distal thigh. Electronically Signed   By: Ulyses Jarred M.D.   On: 11/07/2020 19:26    Procedures Procedures   Medications Ordered in ED Medications  HYDROmorphone (DILAUDID) injection 0.5 mg (0.5 mg Intravenous Given 11/07/20 2007)    ED Course  I have reviewed the triage vital signs and the nursing notes.  Pertinent labs & imaging results that were available during my care of the patient were reviewed by me and considered in my medical decision making (see chart for details).  Clinical Course as of 11/07/20 2352  Sun Nov 07, 2020  1831 CBC with Differential(!) Hemoglobin stable from baseline Platelets normal no leukocytosis. [GL]  1855 Comprehensive metabolic panel(!) Mild hyponatremia.  Hyperglycemia to 231.  No anion gap kidney function stable.  Mild hypercalcemia.  Mild elevation of AST.  Other liver function labs are normal.  Lipase is normal [GL]  1936 US Venous Img Lower Right (DVT Study) 1. Nonocclusive deep venous thrombosis in the right common femoral and femoral veins. 2. Acute superficial greater saphenous vein thrombosis in the mid to distal thigh.    [GL]    Clinical Course User Index [GL] Kierre Hintz, Adora Fridge, PA-C    MDM Rules/Calculators/A&P                         Is a chronically ill-appearing 75 year old  male.  He presents to the emergency department with complaints of right leg swelling and pain.  He has a history of DVT and stopped taking his Eliquis 2 days ago.  His vitals are unremarkable on arrival.  He is afebrile.  Exam with bilateral leg swelling however right leg is more swollen than left leg.  Tenderness to palpation of right thigh down to right calf.  No symptoms of dyspnea is unusual for patient.  I have low suspicion for underlying infection.  Low concern for PE.  I suspect the patient may have another DVT.  Reviewed all labs and imaging. Clinical Course as of 11/07/20 2348  Sun Nov 07, 2020  1831 CBC with Differential(!) Hemoglobin stable from baseline Platelets normal no leukocytosis. [GL]  1855 Comprehensive metabolic panel(!) Mild hyponatremia.  Hyperglycemia to 231.  No anion gap kidney function stable.  Mild hypercalcemia.  Mild elevation of AST.  Other liver function labs are normal.  Lipase is normal [GL]  1936 US Venous Img Lower Right (DVT Study) 1. Nonocclusive deep venous thrombosis in the right common femoral and femoral veins. 2. Acute superficial greater saphenous vein thrombosis in the mid to distal thigh.    [GL]    Clinical Course User Index [GL] Lashaya Kienitz, Adora Fridge, PA-C   Nonocclusive deep PT in the right common femoral and femoral veins as well as acute superficial greater saphenous vein thrombosis in the mid to distal thigh.  Patient is instructed to resume taking his Eliquis.  Sound like his doctor had taken him off due to some concerns of hematuria however he has no symptoms of hematuria right now and his hemoglobin is stable.  Risk versus benefit weighed and patient to resume Eliquis.  I have sent him in for a consult to vascular surgery.  He is to call them on Monday to set up an appointment.  I discussed this case extensively with Dr. Maryan Rued and she  agrees with the plan outlined above.  Final Clinical Impression(s) / ED Diagnoses Final diagnoses:  Acute deep vein thrombosis (DVT) of femoral vein of right lower extremity [facility])    Rx / DC Orders ED Discharge Orders          Ordered    Ambulatory referral to Vascular Surgery        11/07/20 2026             Sheila Oats 11/07/20 2352    Blanchie Dessert, MD 11/09/20 1331

## 2020-11-07 NOTE — ED Notes (Signed)
US at bedside

## 2020-11-07 NOTE — Discharge Instructions (Addendum)
Please restart your Eliquis tonight.  Call vascular surgery on Monday to set up an appointment.  Return to the emergency department if you develop worsening symptoms such as shortness of breath, chest pain, or feeling like you are going to pass out.

## 2020-11-07 NOTE — ED Triage Notes (Signed)
Right groin pain and left leg swelling for 3 days . Nausea and vomiting on their way here.

## 2020-11-08 DIAGNOSIS — K219 Gastro-esophageal reflux disease without esophagitis: Secondary | ICD-10-CM | POA: Diagnosis not present

## 2020-11-08 DIAGNOSIS — I10 Essential (primary) hypertension: Secondary | ICD-10-CM | POA: Diagnosis not present

## 2020-11-08 DIAGNOSIS — F339 Major depressive disorder, recurrent, unspecified: Secondary | ICD-10-CM | POA: Diagnosis not present

## 2020-11-08 DIAGNOSIS — E7800 Pure hypercholesterolemia, unspecified: Secondary | ICD-10-CM | POA: Diagnosis not present

## 2020-11-08 DIAGNOSIS — M189 Osteoarthritis of first carpometacarpal joint, unspecified: Secondary | ICD-10-CM | POA: Diagnosis not present

## 2020-11-08 DIAGNOSIS — E1169 Type 2 diabetes mellitus with other specified complication: Secondary | ICD-10-CM | POA: Diagnosis not present

## 2020-11-08 DIAGNOSIS — M1990 Unspecified osteoarthritis, unspecified site: Secondary | ICD-10-CM | POA: Diagnosis not present

## 2020-11-08 DIAGNOSIS — E119 Type 2 diabetes mellitus without complications: Secondary | ICD-10-CM | POA: Diagnosis not present

## 2020-11-08 DIAGNOSIS — E1165 Type 2 diabetes mellitus with hyperglycemia: Secondary | ICD-10-CM | POA: Diagnosis not present

## 2020-11-11 ENCOUNTER — Other Ambulatory Visit (HOSPITAL_BASED_OUTPATIENT_CLINIC_OR_DEPARTMENT_OTHER): Payer: Self-pay

## 2020-11-11 MED ORDER — HYDROMORPHONE HCL 8 MG PO TABS
ORAL_TABLET | ORAL | 0 refills | Status: DC
  Filled 2020-11-11: qty 210, 30d supply, fill #0

## 2020-11-12 ENCOUNTER — Other Ambulatory Visit (HOSPITAL_BASED_OUTPATIENT_CLINIC_OR_DEPARTMENT_OTHER): Payer: Self-pay

## 2020-11-15 ENCOUNTER — Other Ambulatory Visit (HOSPITAL_BASED_OUTPATIENT_CLINIC_OR_DEPARTMENT_OTHER): Payer: Self-pay

## 2020-11-15 DIAGNOSIS — E1065 Type 1 diabetes mellitus with hyperglycemia: Secondary | ICD-10-CM | POA: Diagnosis not present

## 2020-11-16 ENCOUNTER — Other Ambulatory Visit (HOSPITAL_BASED_OUTPATIENT_CLINIC_OR_DEPARTMENT_OTHER): Payer: Self-pay

## 2020-11-16 ENCOUNTER — Other Ambulatory Visit: Payer: Self-pay

## 2020-11-16 ENCOUNTER — Ambulatory Visit: Payer: HMO | Admitting: Vascular Surgery

## 2020-11-16 ENCOUNTER — Encounter: Payer: Self-pay | Admitting: Vascular Surgery

## 2020-11-16 DIAGNOSIS — I82409 Acute embolism and thrombosis of unspecified deep veins of unspecified lower extremity: Secondary | ICD-10-CM | POA: Insufficient documentation

## 2020-11-16 DIAGNOSIS — I82493 Acute embolism and thrombosis of other specified deep vein of lower extremity, bilateral: Secondary | ICD-10-CM | POA: Diagnosis not present

## 2020-11-16 NOTE — Progress Notes (Signed)
Patient name: Derek [name] MRN: [id number] DOB: 05/09/1945 Sex: male  REASON FOR CONSULT: Evaluate lower extremity DVTs  HPI: Derek [name] is a 75 y.o. 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  He has chronic back pain and is on Dilaudid and this has kept him fairly immobile in bed.  Wife states he has been on the decline for the last year.  Subsequently developed DVTs earlier this year.  He had a DVT study on 08/06/2020 that showed right posterior tibial vein DVT.  Then on 08/13/2020 he had a left posterior tibial vein DVT as well.  He was briefly off his blood thinners for about a day and a half.  Most recently on 11/07/2020 he had nonocclusive DVT in the right common femoral vein and femoral vein with a patent profunda vein.  Sounds like he has tried a number of blood thinners due to hematuria and is currently tolerating Eliquis.  Not wearing compression.  Does complain of bilateral thigh pain.  Wife states his cancer is resolved with no evidence of disease after his previous distal pancreatectomy splenectomy.  No previous DVTs until this year.  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" no pulmonary md, sees dr [name]   Factor V Leiden mutation ([place])    "never had blood clots"   GERD (gastroesophageal reflux disease)    Hiatal hernia    History of kidney stones    multiple kidney stones-not a problem at present   History of pancreatic cancer    01/ 2017  neuroendocrine  pancreas tumor treated sugically -- s/p distal pancreatectomy and splenectomy (per path islet cell, pT1 pNX) no further treatment   History of panic attacks    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    Past Surgical History:  Procedure Laterality Date   ANAL FISTULECTOMY N/A 04/01/2013   Procedure: EXAM UNDER ANESTHESIA WITH ANAL FISSURectomy, sphincterotomy and internal hemorrhoidectomy;  Surgeon: Harl Bowie, MD;  Location: [facility];  Service: General;  Laterality: N/A;   APPLICATION OF WOUND  VAC N/A 05/16/2015   Procedure: APPLICATION OF WOUND VAC;  Surgeon: Armandina Gemma, MD;  Location: [facility];  Service: General;  Laterality: N/A;   APPLICATION OF WOUND VAC N/A 05/20/2015   Procedure: EXHANGE OF ABDOMINAL  WOUND VAC DRESSING;  Surgeon: Armandina Gemma, MD;  Location: [facility];  Service: General;  Laterality: N/A;   COLONOSCOPY     CYSTOSCOPY WITH INSERTION OF UROLIFT N/A 10/12/2016   Procedure: CYSTOSCOPY WITH INSERTION OF UROLIFT;  Surgeon: Franchot Gallo, MD;  Location[facility];  Service: Urology;  Laterality: N/A;   EUS N/A 12/31/2014   Procedure: UPPER ENDOSCOPIC ULTRASOUND (EUS) LINEAR;  Surgeon: Milus Banister, MD;  Location[facility];  Service: Endoscopy;   Laterality: N/A;   HARDWARE REMOVAL Left 2013   left ankle   HEMORRHOIDECTOMY WITH HEMORRHOID BANDING     INCISION AND DRAINAGE ABSCESS N/A 05/14/2015   Procedure: DRAINAGE OF INFECTED PANCREATIC PSEUDOCYST;  Surgeon: Armandina Gemma, MD;  Location: [facility];  Service: General;  Laterality: N/A;   INCISION AND DRAINAGE OF WOUND N/A 05/16/2015   Procedure: IRRIGATION AND DEBRIDEMENT WOUND;  Surgeon: Armandina Gemma, MD;  Location: [facility];  Service: General;  Laterality: N/A;   INCISIONAL HERNIA REPAIR N/A 11/12/2015   Procedure: REPAIR INCISIONAL HERNIA WITH MESH;  Surgeon: Armandina Gemma, MD;  Location: [facility];  Service: General;  Laterality: N/A;   INGUINAL HERNIA REPAIR Right 1974;  [place] N/A 11/12/2015   Procedure: INSERTION OF MESH;  Surgeon: Armandina Gemma, MD;  Location: [facility];  Service: General;  Laterality: N/A;   IR GENERIC HISTORICAL  04/20/2015   IR RADIOLOGIST EVAL & MGMT 04/20/2015 [facility] INTERV RAD   KNEE ARTHROSCOPY Bilateral right 08-08-2000;  left 11-23-2000   meniscal repair and chondroplasty's   KNEE ARTHROSCOPY  03/01/2012   Procedure: ARTHROSCOPY KNEE;  Surgeon: Gearlean Alf, MD;  Location[facility];  Service: Orthopedics;  Laterality: Left;  WITH SYNOVECTOMY    LAPAROTOMY N/A 05/14/2015   Procedure: EXPLORATORY LAPAROTOMY;  Surgeon: Armandina Gemma, MD;  Location: [facility];  Service: General;  Laterality: N/A;   MASS EXCISION N/A 12/24/2019   Procedure: EXCISION LIP CARCINOMA;  Surgeon: Leta Baptist, MD;  Location: [facility];  Service: ENT;  Laterality: N/A;   ORIF LEFT ANKLE FX  08/20/2009   distal tib-fib and malleolus   ROTATOR CUFF REPAIR Bilateral right 1994; left 1993, left 1993   TOTAL KNEE ARTHROPLASTY Right 06/02/2013   Procedure: RIGHT TOTAL KNEE ARTHROPLASTY;  Surgeon: Gearlean Alf, MD;  Location[facility];  Service: Orthopedics;  Laterality: Right;   TOTAL KNEE ARTHROPLASTY Left 01-31-2008   dr Wynelle Link   TOTAL KNEE REVISION Right 01/23/2018   Procedure:  right knee polyethylene revision;  Surgeon: Gaynelle Arabian, MD;  Location: [facility];  Service: Orthopedics;  Laterality: Right;  64min   TRANSURETHRAL RESECTION OF PROSTATE N/A 04/21/2019   Procedure: TRANSURETHRAL RESECTION OF THE PROSTATE (TURP);  Surgeon: Franchot Gallo, MD;  Location: [facility];  Service: Urology;  Laterality: N/A;   UMBILICAL HERNIA REPAIR  08-11-1999   dr Ninfa Linden   UPPER GASTROINTESTINAL ENDOSCOPY  sept 2016   WOUND EXPLORATION N/A 09/21/2016   Procedure: WOUND EXPLORATION AND DEBRIDEMENT ABDOMINAL WALL;  Surgeon: Armandina Gemma, MD;  Location[facility];  Service: General;  Laterality: N/A;    Family History  Problem Relation Age of Onset   Bone cancer Father        Jaw  Factor V Leiden deficiency Daughter    Diabetes Maternal Grandmother    Heart disease Paternal Uncle    Colon cancer Neg Hx    Esophageal cancer Neg Hx    Stomach cancer Neg Hx    Rectal cancer Neg Hx     SOCIAL HISTORY: Social History   Socioeconomic History   Marital status: Married    Spouse name: Ivin Booty   Number of children: 2   Years of education: Not on file   Highest education level: Not on file  Occupational History   Occupation: retired   Tobacco Use   Smoking status: Former    Packs/day: 4.00    Years: 17.00    Pack years: 68.00    Types: Cigarettes    Quit date: 02/21/1975    Years since quitting: 45.7   Smokeless tobacco: Never  Vaping Use   Vaping Use: Never used  Substance and Sexual Activity   Alcohol use: No    Alcohol/week: 0.0 standard drinks   Drug use: No   Sexual activity: Yes  Other Topics Concern   Not on file  Social History Narrative   Married, retired Scientist, product/process development   1 son 1 daughter   2 caffeine drinks/day   10/18/2014   Social Determinants of Health   Financial Resource Strain: Not on file  Food Insecurity: No Food Insecurity   Worried About Charity fundraiser in the Last Year: Never true   [place] in the Last Year: Never  true  Transportation Needs: No Transportation Needs   Lack of Transportation (Medical): No   Lack of Transportation (Non-Medical): No  Physical Activity: Not on file  Stress: Not on file  Social Connections: Not on file  Intimate Partner Violence: Not on file    Allergies  Allergen Reactions   Duloxetine Other (See Comments)    Night sweats   Doxycycline Other (See Comments)    Severe nose bleeds   Morphine And Related Nausea And Vomiting, Swelling and Other (See Comments)    Facial swelling and lips swell, but tolerates immediate-release oxycodone as well as hydrocodone    Oxycontin [Oxycodone Hcl] Swelling and Other (See Comments)    Lips swell, but tolerates immediate-release oxycodone as well as hydrocodone   Quinine Other (See Comments)    Platelets dropped- consumptive coagulopathy   Sertraline Other (See Comments)    Can't sleep or urinate   Voltaren [Diclofenac Sodium] Other (See Comments)    Elevated liver enzymes   Diclofenac Swelling and Other (See Comments)    Elevated liver enzymes and lips swell   Doxycycline Hyclate Other (See Comments)    Epistaxis   Duloxetine Hcl Other (See Comments)    Night sweats   Gabapentin Other (See Comments)    Reaction??   Hydrocodone Swelling and Other (See Comments)    Facial swelling- can tolerate "immediate-release" Hydrocodone, though   Oxybutynin Other (See Comments)    Swelling    Sertraline Hcl Other (See Comments)    Urinary problems and insomnia   Tape Other (See Comments)    SKIN IS FRAGILE- CERTAIN BANDAGES TEAR OFF THE SKIN    Current Outpatient Medications  Medication Sig Dispense Refill   apixaban (ELIQUIS) 5 MG TABS tablet Take 5 mg by mouth 2 (two) times daily.     apixaban (ELIQUIS) 5 MG TABS tablet Take 1 tablet by mouth twice a day 180 tablet 0   AUSTRALIAN DREAM ARTHRITIS 0.025 % CREA Apply 1 application topically  3 (three) times daily as needed (to painful joints).     carvedilol (COREG) 25 MG tablet  Take one tablet by mouth once daily in the evening. (Patient taking differently: Take 25 mg by mouth at bedtime.) 90 tablet 4   clindamycin (CLEOCIN) 150 MG capsule Take 2 capsules by mouth now then take 1 capsule 3 times a day until gone 22 capsule 0   finasteride (PROSCAR) 5 MG tablet Take 1 tablet by mouth once daily 90 tablet 3   fluorouracil (EFUDEX) 5 % cream Apply 1 application externally twice a day 40 g 0   furosemide (LASIX) 20 MG tablet Take 20 mg by mouth daily as needed for edema.     Homeopathic Products (THERAWORX RELIEF) FOAM Apply 1 application topically daily as needed (for knee cramps).     HYDROmorphone (DILAUDID) 8 MG tablet Take 1 tablet by mouth 7 times a day as needed 210 tablet 0   hydrOXYzine (VISTARIL) 25 MG capsule Take 1 capsule by mouth every 8 hours as needed for stress 90 capsule 0   insulin aspart (NOVOLOG) 100 UNIT/ML injection USE 100U/DAY SUBCUTANEOUS VIA PUMP 30 DAY(S) (Patient taking differently: Inject 5-30 Units into the skin See admin instructions. Inject 5-30 units into the skin three times a day before meals, PER SLIDING SCALE) 30 mL 6   insulin aspart (NOVOLOG) 100 UNIT/ML injection Use 100 units daily under the skin via pump 30 mL 6   insulin degludec (TRESIBA FLEXTOUCH) 100 UNIT/ML FlexTouch Pen Inject 14 units Subcutaneous Once a day 30 days (Patient taking differently: Inject 14 Units into the skin daily before breakfast.) 15 mL 6   Insulin Syringe-Needle U-100 31G X 5/16" 1 ML MISC USE AS DIRECTED TO INJECT 200 each 12   lansoprazole (PREVACID) 30 MG capsule Take one capsule by mouth once daily. (Patient taking differently: Take 30 mg by mouth at bedtime.) 90 capsule 4   losartan (COZAAR) 50 MG tablet Take one tablet by mouth every evening. (Patient taking differently: Take 50 mg by mouth at bedtime.) 90 tablet 4   magnesium oxide (MAG-OX) 400 MG tablet Take 800 mg by mouth at bedtime. Triple complex     megestrol (MEGACE) 40 MG tablet Take one tablet  by mouth twice daily. (Patient taking differently: Take 40 mg by mouth at bedtime.) 60 tablet 2   phenazopyridine (PYRIDIUM) 200 MG tablet Take 1 tablet by mouth every 8 hours as needed for urinary pain 60 tablet 1   pravastatin (PRAVACHOL) 20 MG tablet Take one tablet by mouth twice weekly. (Patient taking differently: Take 20 mg by mouth 2 (two) times a week.) 40 tablet 4   senna (SENOKOT) 8.6 MG TABS tablet Take 1 tablet by mouth in the morning.     triamcinolone cream (KENALOG) 0.1 % Apply externally two times a day for 30 days. 80 g 3   amoxicillin-clavulanate (AUGMENTIN) 875-125 MG tablet Take 1 tablet by mouth every 12 hours (Patient not taking: Reported on 11/16/2020) 20 tablet 0   COVID-19 mRNA vaccine, Pfizer, 30 MCG/0.3ML injection INJECT AS DIRECTED (Patient not taking: No sig reported) .3 mL 0   doxycycline (VIBRA-TABS) 100 MG tablet Take 1 tablet by mouth twice a day (Patient not taking: Reported on 11/16/2020) 20 tablet 0   nitrofurantoin, macrocrystal-monohydrate, (MACROBID) 100 MG capsule Take 1 capsule by mouth once daily (Patient not taking: Reported on 11/16/2020) 30 capsule 0   polyethylene glycol powder (MIRALAX) 17 GM/SCOOP powder Mix 1 scoop (17g) in 8  ounces of water or juice once daily (Patient not taking: Reported on 11/16/2020) 510 g 5   triamcinolone cream (KENALOG) 0.1 % Apply 1 application topically daily as needed (Contact dermititis).  (Patient not taking: Reported on 11/16/2020)     Turmeric 500 MG CAPS Take 500 mg by mouth at bedtime. (Patient not taking: Reported on 11/16/2020)     No current facility-administered medications for this visit.    REVIEW OF SYSTEMS:  [X]  denotes positive finding, [ ]  denotes negative finding Cardiac  Comments:  Chest pain or chest pressure:    Shortness of breath upon exertion:    Short of breath when lying flat:    Irregular heart rhythm:        Vascular    Pain in calf, thigh, or hip brought on by ambulation:    Pain in feet at  night that wakes you up from your sleep:     Blood clot in your veins:    Leg swelling:  x       Pulmonary    Oxygen at home:    Productive cough:     Wheezing:         Neurologic    Sudden weakness in arms or legs:     Sudden numbness in arms or legs:     Sudden onset of difficulty speaking or slurred speech:    Temporary loss of vision in one eye:     Problems with dizziness:         Gastrointestinal    Blood in stool:     Vomited blood:         Genitourinary    Burning when urinating:     Blood in urine:        Psychiatric    Major depression:         Hematologic    Bleeding problems:    Problems with blood clotting too easily:        Skin    Rashes or ulcers:        Constitutional    Fever or chills:      PHYSICAL EXAM: Vitals:   11/16/20 1103  BP: (!) 162/89  Pulse: 66  Resp: 18  Temp: (!) 97.3 F (36.3 C)  TempSrc: Temporal  SpO2: 95%  Weight: 250 lb (113.4 kg)  Height: 6' (1.829 m)    GENERAL: The patient is a well-nourished male, in no acute distress. The vital signs are documented above. CARDIAC: There is a regular rate and rhythm.  VASCULAR: No signs of phlegmasia, right leg more swollen than left leg Right PT palpable Left DP palpable PULMONARY: There is good air exchange bilaterally without wheezing or rales. ABDOMEN: Soft and non-tender MUSCULOSKELETAL: There are no major deformities or cyanosis. NEUROLOGIC: No focal weakness or paresthesias are detected. SKIN: There are no ulcers or rashes noted. PSYCHIATRIC: The patient has a normal affect.  DATA:   Most recent DVT study on 11/07/2020 shows nonocclusive DVT in the right common femoral vein with a patent profunda and also nonocclusive thrombus in the femoral vein  Previous DVT study in June shows a left posterior tibial vein DVT and right posterior tibial vein DVT  Assessment/Plan:  75 year old male with history of pancreatic neuroendocrine tumor requiring distal pancreatectomy  splenectomy as well as chronic back pain with fairly limited mobility and factor V leiden that presents with bilateral lower extremity DVTs.  Given reported history of underlying factor V and given multiple DVTs in both lower  extremities diagnosed at different time frames this year, I think his best option would be lifelong anticoagulation.  I do not think he needs any surgical intervention given the most extensive DVT on the right is nonocclusive in the common femoral vein with a patent profunda.  I discussed any intervention would be to prevent post thrombotic syndrome but I think we can manage his symptoms with elevation and compression and other conservative measures.  We will get him sized for thigh-high compression today.  He can follow with me as needed in the future.  Seems to be tolerating Eliquis managed by his PCP.  I do not see any evidence of proximal extension and again given the nonocclusive nature of the DVT on the right this is likely best managed with anticoagulation and not surgical intervention.   Marty Heck, MD [facility] Office: [phone]

## 2020-11-17 ENCOUNTER — Encounter (HOSPITAL_COMMUNITY): Payer: Self-pay | Admitting: Family Medicine

## 2020-11-17 ENCOUNTER — Inpatient Hospital Stay (HOSPITAL_COMMUNITY)
Admission: EM | Admit: 2020-11-17 | Discharge: 2020-11-30 | DRG: 918 | Disposition: A | Discharge status: Other Acute Inpatient Hospital | Payer: HMO | Attending: Family Medicine | Admitting: Family Medicine

## 2020-11-17 DIAGNOSIS — D6851 Activated protein C resistance: Secondary | ICD-10-CM | POA: Diagnosis not present

## 2020-11-17 DIAGNOSIS — I152 Hypertension secondary to endocrine disorders: Secondary | ICD-10-CM | POA: Diagnosis not present

## 2020-11-17 DIAGNOSIS — R112 Nausea with vomiting, unspecified: Secondary | ICD-10-CM

## 2020-11-17 DIAGNOSIS — I82409 Acute embolism and thrombosis of unspecified deep veins of unspecified lower extremity: Secondary | ICD-10-CM | POA: Diagnosis present

## 2020-11-17 DIAGNOSIS — R739 Hyperglycemia, unspecified: Secondary | ICD-10-CM | POA: Diagnosis not present

## 2020-11-17 DIAGNOSIS — I129 Hypertensive chronic kidney disease with stage 1 through stage 4 chronic kidney disease, or unspecified chronic kidney disease: Secondary | ICD-10-CM | POA: Diagnosis present

## 2020-11-17 DIAGNOSIS — E1159 Type 2 diabetes mellitus with other circulatory complications: Secondary | ICD-10-CM | POA: Diagnosis not present

## 2020-11-17 DIAGNOSIS — M48061 Spinal stenosis, lumbar region without neurogenic claudication: Secondary | ICD-10-CM | POA: Diagnosis present

## 2020-11-17 DIAGNOSIS — I82811 Embolism and thrombosis of superficial veins of right lower extremities: Secondary | ICD-10-CM | POA: Diagnosis not present

## 2020-11-17 DIAGNOSIS — Z781 Physical restraint status: Secondary | ICD-10-CM | POA: Diagnosis not present

## 2020-11-17 DIAGNOSIS — M545 Low back pain, unspecified: Secondary | ICD-10-CM | POA: Diagnosis not present

## 2020-11-17 DIAGNOSIS — Z8249 Family history of ischemic heart disease and other diseases of the circulatory system: Secondary | ICD-10-CM

## 2020-11-17 DIAGNOSIS — G4733 Obstructive sleep apnea (adult) (pediatric): Secondary | ICD-10-CM | POA: Diagnosis not present

## 2020-11-17 DIAGNOSIS — N200 Calculus of kidney: Secondary | ICD-10-CM | POA: Diagnosis present

## 2020-11-17 DIAGNOSIS — R231 Pallor: Secondary | ICD-10-CM | POA: Diagnosis not present

## 2020-11-17 DIAGNOSIS — Z20822 Contact with and (suspected) exposure to covid-19: Secondary | ICD-10-CM | POA: Diagnosis present

## 2020-11-17 DIAGNOSIS — Z90411 Acquired partial absence of pancreas: Secondary | ICD-10-CM

## 2020-11-17 DIAGNOSIS — I10 Essential (primary) hypertension: Secondary | ICD-10-CM | POA: Diagnosis not present

## 2020-11-17 DIAGNOSIS — E440 Moderate protein-calorie malnutrition: Secondary | ICD-10-CM | POA: Diagnosis present

## 2020-11-17 DIAGNOSIS — M5440 Lumbago with sciatica, unspecified side: Secondary | ICD-10-CM | POA: Diagnosis not present

## 2020-11-17 DIAGNOSIS — T40602 Poisoning by unspecified narcotics, intentional self-harm: Secondary | ICD-10-CM | POA: Diagnosis not present

## 2020-11-17 DIAGNOSIS — Z794 Long term (current) use of insulin: Secondary | ICD-10-CM | POA: Diagnosis not present

## 2020-11-17 DIAGNOSIS — Z833 Family history of diabetes mellitus: Secondary | ICD-10-CM | POA: Diagnosis not present

## 2020-11-17 DIAGNOSIS — Z8546 Personal history of malignant neoplasm of prostate: Secondary | ICD-10-CM

## 2020-11-17 DIAGNOSIS — G894 Chronic pain syndrome: Secondary | ICD-10-CM | POA: Diagnosis not present

## 2020-11-17 DIAGNOSIS — I825Y3 Chronic embolism and thrombosis of unspecified deep veins of proximal lower extremity, bilateral: Secondary | ICD-10-CM

## 2020-11-17 DIAGNOSIS — F322 Major depressive disorder, single episode, severe without psychotic features: Secondary | ICD-10-CM | POA: Diagnosis present

## 2020-11-17 DIAGNOSIS — E871 Hypo-osmolality and hyponatremia: Secondary | ICD-10-CM | POA: Diagnosis not present

## 2020-11-17 DIAGNOSIS — I82411 Acute embolism and thrombosis of right femoral vein: Secondary | ICD-10-CM | POA: Diagnosis present

## 2020-11-17 DIAGNOSIS — T40602D Poisoning by unspecified narcotics, intentional self-harm, subsequent encounter: Secondary | ICD-10-CM | POA: Diagnosis not present

## 2020-11-17 DIAGNOSIS — T50902A Poisoning by unspecified drugs, medicaments and biological substances, intentional self-harm, initial encounter: Secondary | ICD-10-CM | POA: Diagnosis present

## 2020-11-17 DIAGNOSIS — N189 Chronic kidney disease, unspecified: Secondary | ICD-10-CM | POA: Diagnosis present

## 2020-11-17 DIAGNOSIS — N400 Enlarged prostate without lower urinary tract symptoms: Secondary | ICD-10-CM | POA: Diagnosis present

## 2020-11-17 DIAGNOSIS — Z7401 Bed confinement status: Secondary | ICD-10-CM

## 2020-11-17 DIAGNOSIS — E669 Obesity, unspecified: Secondary | ICD-10-CM | POA: Diagnosis not present

## 2020-11-17 DIAGNOSIS — E119 Type 2 diabetes mellitus without complications: Secondary | ICD-10-CM | POA: Diagnosis not present

## 2020-11-17 DIAGNOSIS — Z85828 Personal history of other malignant neoplasm of skin: Secondary | ICD-10-CM

## 2020-11-17 DIAGNOSIS — T40602A Poisoning by unspecified narcotics, intentional self-harm, initial encounter: Secondary | ICD-10-CM

## 2020-11-17 DIAGNOSIS — I482 Chronic atrial fibrillation, unspecified: Secondary | ICD-10-CM | POA: Diagnosis present

## 2020-11-17 DIAGNOSIS — M5136 Other intervertebral disc degeneration, lumbar region: Secondary | ICD-10-CM | POA: Diagnosis present

## 2020-11-17 DIAGNOSIS — R4182 Altered mental status, unspecified: Secondary | ICD-10-CM | POA: Diagnosis present

## 2020-11-17 DIAGNOSIS — M5126 Other intervertebral disc displacement, lumbar region: Secondary | ICD-10-CM | POA: Diagnosis not present

## 2020-11-17 DIAGNOSIS — T402X5A Adverse effect of other opioids, initial encounter: Secondary | ICD-10-CM | POA: Diagnosis present

## 2020-11-17 DIAGNOSIS — I82402 Acute embolism and thrombosis of unspecified deep veins of left lower extremity: Secondary | ICD-10-CM | POA: Diagnosis not present

## 2020-11-17 DIAGNOSIS — E1165 Type 2 diabetes mellitus with hyperglycemia: Secondary | ICD-10-CM | POA: Diagnosis present

## 2020-11-17 DIAGNOSIS — D6489 Other specified anemias: Secondary | ICD-10-CM | POA: Diagnosis present

## 2020-11-17 DIAGNOSIS — E1122 Type 2 diabetes mellitus with diabetic chronic kidney disease: Secondary | ICD-10-CM | POA: Diagnosis present

## 2020-11-17 DIAGNOSIS — R42 Dizziness and giddiness: Secondary | ICD-10-CM | POA: Diagnosis not present

## 2020-11-17 DIAGNOSIS — I82491 Acute embolism and thrombosis of other specified deep vein of right lower extremity: Secondary | ICD-10-CM | POA: Diagnosis not present

## 2020-11-17 DIAGNOSIS — Z86718 Personal history of other venous thrombosis and embolism: Secondary | ICD-10-CM

## 2020-11-17 DIAGNOSIS — T50904A Poisoning by unspecified drugs, medicaments and biological substances, undetermined, initial encounter: Secondary | ICD-10-CM | POA: Diagnosis not present

## 2020-11-17 DIAGNOSIS — Z79899 Other long term (current) drug therapy: Secondary | ICD-10-CM

## 2020-11-17 DIAGNOSIS — R310 Gross hematuria: Secondary | ICD-10-CM | POA: Diagnosis present

## 2020-11-17 DIAGNOSIS — M4807 Spinal stenosis, lumbosacral region: Secondary | ICD-10-CM | POA: Diagnosis not present

## 2020-11-17 DIAGNOSIS — Z8507 Personal history of malignant neoplasm of pancreas: Secondary | ICD-10-CM

## 2020-11-17 DIAGNOSIS — G8929 Other chronic pain: Secondary | ICD-10-CM | POA: Diagnosis present

## 2020-11-17 DIAGNOSIS — N390 Urinary tract infection, site not specified: Secondary | ICD-10-CM | POA: Diagnosis not present

## 2020-11-17 DIAGNOSIS — T887XXA Unspecified adverse effect of drug or medicament, initial encounter: Secondary | ICD-10-CM | POA: Diagnosis not present

## 2020-11-17 DIAGNOSIS — T1491XA Suicide attempt, initial encounter: Secondary | ICD-10-CM

## 2020-11-17 DIAGNOSIS — C259 Malignant neoplasm of pancreas, unspecified: Secondary | ICD-10-CM | POA: Diagnosis not present

## 2020-11-17 DIAGNOSIS — Z9641 Presence of insulin pump (external) (internal): Secondary | ICD-10-CM | POA: Diagnosis present

## 2020-11-17 DIAGNOSIS — T402X2A Poisoning by other opioids, intentional self-harm, initial encounter: Secondary | ICD-10-CM | POA: Diagnosis not present

## 2020-11-17 DIAGNOSIS — Z6833 Body mass index (BMI) 33.0-33.9, adult: Secondary | ICD-10-CM | POA: Diagnosis not present

## 2020-11-17 DIAGNOSIS — F410 Panic disorder [episodic paroxysmal anxiety] without agoraphobia: Secondary | ICD-10-CM | POA: Diagnosis present

## 2020-11-17 DIAGNOSIS — Z9079 Acquired absence of other genital organ(s): Secondary | ICD-10-CM

## 2020-11-17 DIAGNOSIS — K219 Gastro-esophageal reflux disease without esophagitis: Secondary | ICD-10-CM | POA: Diagnosis present

## 2020-11-17 DIAGNOSIS — E1151 Type 2 diabetes mellitus with diabetic peripheral angiopathy without gangrene: Secondary | ICD-10-CM | POA: Diagnosis present

## 2020-11-17 DIAGNOSIS — Z9081 Acquired absence of spleen: Secondary | ICD-10-CM

## 2020-11-17 DIAGNOSIS — R109 Unspecified abdominal pain: Secondary | ICD-10-CM | POA: Diagnosis not present

## 2020-11-17 DIAGNOSIS — Z87891 Personal history of nicotine dependence: Secondary | ICD-10-CM

## 2020-11-17 DIAGNOSIS — Z7901 Long term (current) use of anticoagulants: Secondary | ICD-10-CM

## 2020-11-17 DIAGNOSIS — I82593 Chronic embolism and thrombosis of other specified deep vein of lower extremity, bilateral: Secondary | ICD-10-CM | POA: Diagnosis not present

## 2020-11-17 DIAGNOSIS — K5903 Drug induced constipation: Secondary | ICD-10-CM | POA: Diagnosis not present

## 2020-11-17 DIAGNOSIS — H8101 Meniere's disease, right ear: Secondary | ICD-10-CM | POA: Diagnosis present

## 2020-11-17 DIAGNOSIS — Z96653 Presence of artificial knee joint, bilateral: Secondary | ICD-10-CM | POA: Diagnosis present

## 2020-11-17 DIAGNOSIS — I82401 Acute embolism and thrombosis of unspecified deep veins of right lower extremity: Secondary | ICD-10-CM | POA: Diagnosis not present

## 2020-11-17 DIAGNOSIS — C61 Malignant neoplasm of prostate: Secondary | ICD-10-CM | POA: Diagnosis not present

## 2020-11-17 DIAGNOSIS — G4700 Insomnia, unspecified: Secondary | ICD-10-CM | POA: Diagnosis present

## 2020-11-17 DIAGNOSIS — M549 Dorsalgia, unspecified: Secondary | ICD-10-CM | POA: Diagnosis present

## 2020-11-17 LAB — CBC WITH DIFFERENTIAL/PLATELET
Abs Immature Granulocytes: 0.03 10*3/uL (ref 0.00–0.07)
Basophils Absolute: 0.1 10*3/uL (ref 0.0–0.1)
Basophils Relative: 1 %
Eosinophils Absolute: 0.2 10*3/uL (ref 0.0–0.5)
Eosinophils Relative: 2 %
HCT: 35.2 % — ABNORMAL LOW (ref 39.0–52.0)
Hemoglobin: 11.7 g/dL — ABNORMAL LOW (ref 13.0–17.0)
Immature Granulocytes: 0 %
Lymphocytes Relative: 29 %
Lymphs Abs: 2.9 10*3/uL (ref 0.7–4.0)
MCH: 30.5 pg (ref 26.0–34.0)
MCHC: 33.2 g/dL (ref 30.0–36.0)
MCV: 91.7 fL (ref 80.0–100.0)
Monocytes Absolute: 1.4 10*3/uL — ABNORMAL HIGH (ref 0.1–1.0)
Monocytes Relative: 13 %
Neutro Abs: 5.6 10*3/uL (ref 1.7–7.7)
Neutrophils Relative %: 55 %
Platelets: 364 10*3/uL (ref 150–400)
RBC: 3.84 MIL/uL — ABNORMAL LOW (ref 4.22–5.81)
RDW: 13.3 % (ref 11.5–15.5)
WBC: 10.3 10*3/uL (ref 4.0–10.5)
nRBC: 0 % (ref 0.0–0.2)

## 2020-11-17 LAB — GLUCOSE, CAPILLARY
Glucose-Capillary: 197 mg/dL — ABNORMAL HIGH (ref 70–99)
Glucose-Capillary: 202 mg/dL — ABNORMAL HIGH (ref 70–99)
Glucose-Capillary: 277 mg/dL — ABNORMAL HIGH (ref 70–99)
Glucose-Capillary: 295 mg/dL — ABNORMAL HIGH (ref 70–99)
Glucose-Capillary: 314 mg/dL — ABNORMAL HIGH (ref 70–99)

## 2020-11-17 LAB — URINALYSIS, ROUTINE W REFLEX MICROSCOPIC
Bacteria, UA: NONE SEEN
Bilirubin Urine: NEGATIVE
Glucose, UA: >1000 mg/dL — AB
Ketones, ur: NEGATIVE mg/dL
Leukocytes,Ua: NEGATIVE
Nitrite: POSITIVE — AB
Protein, ur: 100 mg/dL — AB
RBC / HPF: >50 RBC/hpf — ABNORMAL HIGH (ref 0–5)
Specific Gravity, Urine: 1.02 (ref 1.005–1.030)
WBC, UA: >50 WBC/hpf — ABNORMAL HIGH (ref 0–5)
pH: 6 (ref 5.0–8.0)

## 2020-11-17 LAB — RESP PANEL BY RT-PCR (FLU A&B, COVID) ARPGX2
Influenza A by PCR: NEGATIVE
Influenza B by PCR: NEGATIVE
SARS Coronavirus 2 by RT PCR: NEGATIVE

## 2020-11-17 LAB — BASIC METABOLIC PANEL
Anion gap: 8 (ref 5–15)
BUN: 29 mg/dL — ABNORMAL HIGH (ref 8–23)
CO2: 19 mmol/L — ABNORMAL LOW (ref 22–32)
Calcium: 11.2 mg/dL — ABNORMAL HIGH (ref 8.9–10.3)
Chloride: 109 mmol/L (ref 98–111)
Creatinine, Ser: 1.15 mg/dL (ref 0.61–1.24)
GFR, Estimated: >60 mL/min (ref >60)
Glucose, Bld: 347 mg/dL — ABNORMAL HIGH (ref 70–99)
Potassium: 4.3 mmol/L (ref 3.5–5.1)
Sodium: 136 mmol/L (ref 135–145)

## 2020-11-17 LAB — ETHANOL: Alcohol, Ethyl (B): <10 mg/dL (ref <10)

## 2020-11-17 LAB — RAPID URINE DRUG SCREEN, HOSP PERFORMED
Amphetamines: NOT DETECTED
Barbiturates: NOT DETECTED
Benzodiazepines: NOT DETECTED
Cocaine: NOT DETECTED
Opiates: POSITIVE — AB
Tetrahydrocannabinol: NOT DETECTED

## 2020-11-17 LAB — TSH: TSH: 1.045 u[IU]/mL (ref 0.350–4.500)

## 2020-11-17 LAB — SALICYLATE LEVEL: Salicylate Lvl: <7 mg/dL — ABNORMAL LOW (ref 7.0–30.0)

## 2020-11-17 LAB — MRSA NEXT GEN BY PCR, NASAL: MRSA by PCR Next Gen: NOT DETECTED

## 2020-11-17 LAB — VITAMIN B12: Vitamin B-12: 207 pg/mL (ref 180–914)

## 2020-11-17 LAB — ACETAMINOPHEN LEVEL: Acetaminophen (Tylenol), Serum: <10 ug/mL — ABNORMAL LOW (ref 10–30)

## 2020-11-17 MED ORDER — INSULIN ASPART 100 UNIT/ML IJ SOLN
0.0000 [IU] | INTRAMUSCULAR | Status: DC
  Administered 2020-11-17: 8 [IU] via SUBCUTANEOUS
  Administered 2020-11-17: 3 [IU] via SUBCUTANEOUS
  Administered 2020-11-17: 5 [IU] via SUBCUTANEOUS
  Administered 2020-11-17: 8 [IU] via SUBCUTANEOUS
  Administered 2020-11-18: 3 [IU] via SUBCUTANEOUS
  Administered 2020-11-18: 5 [IU] via SUBCUTANEOUS
  Administered 2020-11-18: 3 [IU] via SUBCUTANEOUS
  Administered 2020-11-18: 15 [IU] via SUBCUTANEOUS
  Administered 2020-11-18 – 2020-11-19 (×3): 5 [IU] via SUBCUTANEOUS
  Administered 2020-11-19: 8 [IU] via SUBCUTANEOUS
  Administered 2020-11-19 (×2): 5 [IU] via SUBCUTANEOUS
  Administered 2020-11-19 – 2020-11-20 (×2): 8 [IU] via SUBCUTANEOUS
  Administered 2020-11-20: 3 [IU] via SUBCUTANEOUS
  Administered 2020-11-20: 15 [IU] via SUBCUTANEOUS
  Administered 2020-11-20: 3 [IU] via SUBCUTANEOUS
  Administered 2020-11-21 (×2): 15 [IU] via SUBCUTANEOUS
  Administered 2020-11-21: 5 [IU] via SUBCUTANEOUS
  Administered 2020-11-21: 8 [IU] via SUBCUTANEOUS
  Administered 2020-11-22: 11 [IU] via SUBCUTANEOUS
  Administered 2020-11-22: 8 [IU] via SUBCUTANEOUS
  Administered 2020-11-22: 5 [IU] via SUBCUTANEOUS

## 2020-11-17 MED ORDER — INSULIN GLARGINE-YFGN 100 UNIT/ML ~~LOC~~ SOLN
14.0000 [IU] | Freq: Every day | SUBCUTANEOUS | Status: DC
  Filled 2020-11-17 (×2): qty 0.14

## 2020-11-17 MED ORDER — SODIUM CHLORIDE 0.9 % IV SOLN
6.2500 mg | Freq: Four times a day (QID) | INTRAVENOUS | Status: DC | PRN
  Administered 2020-11-17: 6.25 mg via INTRAVENOUS
  Filled 2020-11-17 (×5): qty 0.25

## 2020-11-17 MED ORDER — PRAVASTATIN SODIUM 20 MG PO TABS
20.0000 mg | ORAL_TABLET | ORAL | Status: DC
  Administered 2020-11-18 – 2020-11-29 (×2): 20 mg via ORAL
  Filled 2020-11-17 (×3): qty 1

## 2020-11-17 MED ORDER — MAGNESIUM OXIDE -MG SUPPLEMENT 400 (240 MG) MG PO TABS
800.0000 mg | ORAL_TABLET | Freq: Every day | ORAL | Status: DC
  Administered 2020-11-17 – 2020-11-29 (×13): 800 mg via ORAL
  Filled 2020-11-17 (×13): qty 2

## 2020-11-17 MED ORDER — MAGNESIUM OXIDE 400 MG PO TABS: 800.0000 mg | ORAL_TABLET | Freq: Every day | ORAL | Status: DC

## 2020-11-17 MED ORDER — GLUCAGON HCL RDNA (DIAGNOSTIC) 1 MG IJ SOLR: 1.0000 mg | Freq: Once | INTRAMUSCULAR | Status: DC | PRN

## 2020-11-17 MED ORDER — PANTOPRAZOLE SODIUM 20 MG PO TBEC
20.0000 mg | DELAYED_RELEASE_TABLET | Freq: Every day | ORAL | Status: DC
  Administered 2020-11-18 – 2020-11-29 (×10): 20 mg via ORAL
  Filled 2020-11-17 (×14): qty 1

## 2020-11-17 MED ORDER — LOSARTAN POTASSIUM 50 MG PO TABS
50.0000 mg | ORAL_TABLET | Freq: Every day | ORAL | Status: DC
  Administered 2020-11-17 – 2020-11-18 (×2): 50 mg via ORAL
  Filled 2020-11-17 (×2): qty 1

## 2020-11-17 MED ORDER — CHLORHEXIDINE GLUCONATE CLOTH 2 % EX PADS
6.0000 | MEDICATED_PAD | Freq: Every day | CUTANEOUS | Status: DC
  Administered 2020-11-18 – 2020-11-20 (×3): 6 via TOPICAL

## 2020-11-17 MED ORDER — NALOXONE HCL 4 MG/10ML IJ SOLN
0.2500 mg/h | INTRAVENOUS | Status: DC
  Filled 2020-11-17: qty 10

## 2020-11-17 MED ORDER — NALOXONE HCL 4 MG/10ML IJ SOLN
0.5000 mg/h | INTRAVENOUS | Status: DC
  Administered 2020-11-17: 0.5 mg/h via INTRAVENOUS
  Filled 2020-11-17: qty 10

## 2020-11-17 MED ORDER — FUROSEMIDE 20 MG PO TABS: 20.0000 mg | ORAL_TABLET | Freq: Every day | ORAL | Status: DC | PRN

## 2020-11-17 MED ORDER — ENOXAPARIN SODIUM 120 MG/0.8ML IJ SOSY
1.0000 mg/kg | PREFILLED_SYRINGE | Freq: Two times a day (BID) | INTRAMUSCULAR | Status: DC
  Administered 2020-11-17 (×2): 114 mg via SUBCUTANEOUS
  Filled 2020-11-17 (×3): qty 0.76

## 2020-11-17 MED ORDER — SENNA 8.6 MG PO TABS
1.0000 | ORAL_TABLET | Freq: Every day | ORAL | Status: DC
  Administered 2020-11-18 – 2020-11-20 (×3): 8.6 mg via ORAL
  Filled 2020-11-17 (×4): qty 1

## 2020-11-17 MED ORDER — APIXABAN 5 MG PO TABS
5.0000 mg | ORAL_TABLET | Freq: Two times a day (BID) | ORAL | Status: DC
  Filled 2020-11-17: qty 1

## 2020-11-17 MED ORDER — INSULIN ASPART 100 UNIT/ML IJ SOLN: 5.0000 [IU] | Freq: Three times a day (TID) | INTRAMUSCULAR | Status: DC

## 2020-11-17 MED ORDER — ORAL CARE MOUTH RINSE
15.0000 mL | Freq: Two times a day (BID) | OROMUCOSAL | Status: DC
  Administered 2020-11-18 – 2020-11-29 (×19): 15 mL via OROMUCOSAL

## 2020-11-17 MED ORDER — INSULIN ASPART 100 UNIT/ML IJ SOLN: 2.0000 [IU] | INTRAMUSCULAR | Status: DC

## 2020-11-17 MED ORDER — CARVEDILOL 12.5 MG PO TABS
25.0000 mg | ORAL_TABLET | Freq: Every day | ORAL | Status: DC
  Administered 2020-11-17 – 2020-11-18 (×2): 25 mg via ORAL
  Filled 2020-11-17 (×2): qty 2

## 2020-11-17 NOTE — ED Notes (Signed)
Urinal at bedside. ENMiles 

## 2020-11-17 NOTE — ED Notes (Signed)
RN notified pharmacy that they need to send up Narcan drip

## 2020-11-17 NOTE — ED Notes (Signed)
Pt states overdose was intentional and an attempt to end his life.

## 2020-11-17 NOTE — ED Notes (Signed)
Security contacted to collect the narcotics retrieved from patient which [place] MD is holding to release to security

## 2020-11-17 NOTE — Progress Notes (Signed)
Wife updated at bedside. He is still having some nausea, so will remain NPO for now and con't phenergan PRN.  [name], DO 11/17/20 3:18 PM [facility]

## 2020-11-17 NOTE — ED Triage Notes (Signed)
Pt states he took 100, 8 mg Dilaudid pills. Pt is cold, clammy, with pinpoint pupils.

## 2020-11-17 NOTE — ED Notes (Signed)
MD Molpus stated that he contacted Poison Control.

## 2020-11-17 NOTE — H&P (Signed)
NAME:  RIYAN HAILE, MRN:  [id number], DOB:  07/13/1945, LOS: 0 ADMISSION DATE:  11/17/2020, CONSULTATION DATE:  11/17/20 REFERRING MD:  Choitner- [facility], CHIEF COMPLAINT:  opiate OD  History of Present Illness:  Mr. Broughton is a 75 y/o gentleman with a history of chronic pain on dilaudid who presented with uncontrolled chronic pain and intentional overdose. He took >100 pills of dilaudid 8mg . His home dose is 8mg  up to 7 times per day as needed. He felt like his pain was too uncontrolled, but didn't feel better after his overdose. He presented to the ED with cool clammy skin and miosis. He was started on a narcan infusion in the ED with a goal of maintaining adequate respirations. Tylenol and salicylates negative. He developed nausea and vomiting in the ED this morning after eating breakfast. He has 1:1 sitter. 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 Factor V leiden mutation. BPH GERD OSA  Significant Hospital Events: Including procedures, antibiotic start and stop dates in addition to other pertinent events   9/28 admitted, started on narcan infusion  Interim History / Subjective:    Objective   Blood pressure (!) 151/69, pulse 70, temperature 97.8 F (36.6 C), resp. rate 19, SpO2 100 %.       No intake or output data in the 24 hours ending 11/17/20 0855 There were no vitals filed for this visit.  Examination: General: Chronically ill appearing man sitting up in bed in NAD. Awake and alert. HENT: [place]/AT, eyes anicteric Lungs: CTAB, no tachypnea or bradypnea. Speaking in full sentences. Cardiovascular: S1S2, RRR Abdomen: Obese, soft, NT. Well-healed abdominal surgical scars. Actively vomiting during encounter- small volume, nonbloody, and  nonbilious. Extremities: No peripheral edema.  Neuro: Awake and alert, moving all extremities. Derm: warm, dry  Resolved Hospital Problem list     Assessment & Plan:   Suicide attempt due to opiate OD-- took 100 pills, 8mg  each. Present on admit. -Narcan infusion; needs to be admitted to ICU. -Con't pulse ox monitoring; can wean narcan. -Con't 1:1 monitoring. -Psychiatry consultation needed. I anticipate this will be hard to manage in the future with chronic pain needing treatment and his use of these meds for suicide attempt.  Hyperglycemia, IDDM with history of pancreatectomy. Present on admit. -con't PTA long-acting insulin 14 units daily -insulin aspart 5 units TIDAC -SSI PRN -goal BG 140-180  Nausea and vomiting due ot overdose and likely withdrawal precipitated by narcan infusion- Present on admit. -phenergan PRN; QTC on most recent EKG WNL. -NPO except meds until nausea resolved  GERD, chronic, present on admit. -con't PPI; pantoprazole while here  H/o DVT, on chronic AC -con't apixaban while admitted -supposed to be wearing thigh high compression hose per vascular surgery note yesterday; will order compression stockings  Chronic anemia; has h/o hematuria on AC -monitor daily CBC -con't apixaban  Best Practice (right click and "Reselect all SmartList Selections" daily)   Diet/type: NPO w/ oral meds DVT prophylaxis: DOAC GI prophylaxis: PPI- PTA med Lines: N/A Foley:  N/A Code Status:  full code Last date of multidisciplinary goals of care discussion [  ]  Labs   CBC: Recent Labs  Lab 11/17/20 0401  WBC 10.3  NEUTROABS 5.6  HGB 11.7*  HCT 35.2*  MCV 91.7  PLT 364  Basic Metabolic Panel: Recent Labs  Lab 11/17/20 0401  NA 136  K 4.3  CL 109  CO2 19*  GLUCOSE 347*  BUN 29*  CREATININE 1.15  CALCIUM 11.2*   GFR: Estimated Creatinine Clearance: 72.1 mL/min (by C-G formula based on SCr of 1.15 mg/dL). Recent Labs  Lab 11/17/20 0401  WBC  10.3    Liver Function Tests: No results for input(s): AST, ALT, ALKPHOS, BILITOT, PROT, ALBUMIN in the last 168 hours. No results for input(s): LIPASE, AMYLASE in the last 168 hours. No results for input(s): AMMONIA in the last 168 hours.  ABG    Component Value Date/Time   PHART 7.319 (L) 02/25/2015 1427   PCO2ART 42.9 02/25/2015 1427   PO2ART 68.7 (L) 02/25/2015 1427   HCO3 21.7 02/25/2015 1427   TCO2 26 04/21/2019 0820   ACIDBASEDEF 4.0 (H) 02/25/2015 1427   O2SAT 91.4 02/25/2015 1427     Coagulation Profile: No results for input(s): INR, PROTIME in the last 168 hours.  Cardiac Enzymes: No results for input(s): CKTOTAL, CKMB, CKMBINDEX, TROPONINI in the last 168 hours.  HbA1C: Hgb A1c MFr Bld  Date/Time Value Ref Range Status  09/15/2016 02:30 PM 10.2 (H) 4.8 - 5.6 % Final    Comment:    (NOTE)         Pre-diabetes: 5.7 - 6.4         Diabetes: >6.4         Glycemic control for adults with diabetes: <7.0   11/13/2015 11:11 AM 8.8 (H) 4.8 - 5.6 % Final    Comment:    (NOTE)         Pre-diabetes: 5.7 - 6.4         Diabetes: >6.4         Glycemic control for adults with diabetes: <7.0     CBG: No results for input(s): GLUCAP in the last 168 hours.  Review of Systems:   Review of Systems  Constitutional:  Negative for fever.  HENT: Negative.    Respiratory:  Negative for cough and shortness of breath.   Cardiovascular:  Negative for chest pain.  Gastrointestinal:  Positive for nausea and vomiting.  Genitourinary: Negative.   Musculoskeletal:  Positive for back pain and joint pain.  Neurological: Negative.        Chronic weakness, mostly bedbound due to pain    Past Medical History:  He,  has a past medical history of Anxiety, Arthritis, Bilateral leg cramps, BPH (benign prostatic hyperplasia), DDD (degenerative disc disease), lumbar, Deafness in right ear, Depression, Dermatitis, Dyspnea, Factor V Leiden mutation (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   Surgical History:   Past Surgical History:  Procedure Laterality Date   ANAL FISTULECTOMY N/A 04/01/2013   Procedure: EXAM UNDER ANESTHESIA WITH ANAL FISSURectomy, sphincterotomy and internal hemorrhoidectomy;  Surgeon: Harl Bowie, MD;  Location: [place];  Service: General;  Laterality: N/A;   APPLICATION OF WOUND VAC N/A 05/16/2015   Procedure: APPLICATION OF WOUND VAC;  Surgeon: Armandina Gemma, MD;  Location: [facility];  Service: General;  Laterality: N/A;   APPLICATION  OF WOUND VAC N/A 05/20/2015   Procedure: EXHANGE OF ABDOMINAL  WOUND VAC DRESSING;  Surgeon: Armandina Gemma, MD;  Location: [facility];  Service: General;  Laterality: N/A;   COLONOSCOPY     CYSTOSCOPY WITH INSERTION OF UROLIFT N/A 10/12/2016   Procedure: CYSTOSCOPY WITH INSERTION OF UROLIFT;  Surgeon: Franchot Gallo, MD;  Location: [facility];  Service: Urology;  Laterality: N/A;   EUS N/A 12/31/2014   Procedure: UPPER ENDOSCOPIC ULTRASOUND (EUS) LINEAR;  Surgeon: Milus Banister, MD;  Location: [facility];  Service: Endoscopy;  Laterality: N/A;   HARDWARE REMOVAL Left 2013   left ankle   HEMORRHOIDECTOMY WITH HEMORRHOID BANDING     INCISION AND DRAINAGE ABSCESS N/A 05/14/2015   Procedure: DRAINAGE OF INFECTED PANCREATIC PSEUDOCYST;  Surgeon: Armandina Gemma, MD;  Location: [facility];  Service: General;  Laterality: N/A;   INCISION AND DRAINAGE OF WOUND N/A 05/16/2015   Procedure: IRRIGATION AND DEBRIDEMENT WOUND;   Surgeon: Armandina Gemma, MD;  Location: [facility];  Service: General;  Laterality: N/A;   INCISIONAL HERNIA REPAIR N/A 11/12/2015   Procedure: REPAIR INCISIONAL HERNIA WITH MESH;  Surgeon: Armandina Gemma, MD;  Location: [facility];  Service: General;  Laterality: N/A;   INGUINAL HERNIA REPAIR Right 1974;  [place] N/A 11/12/2015   Procedure: INSERTION OF MESH;  Surgeon: Armandina Gemma, MD;  Location: [place] [facility];  Service: General;  Laterality: N/A;   IR GENERIC HISTORICAL  04/20/2015   IR RADIOLOGIST EVAL & MGMT 04/20/2015 [facility] INTERV RAD   KNEE ARTHROSCOPY Bilateral right 08-08-2000;  left 11-23-2000   meniscal repair and chondroplasty's   KNEE ARTHROSCOPY  03/01/2012   Procedure: ARTHROSCOPY KNEE;  Surgeon: Gearlean Alf, MD;  Location: [facility];  Service: Orthopedics;  Laterality: Left;  WITH SYNOVECTOMY    LAPAROTOMY N/A 05/14/2015   Procedure: EXPLORATORY LAPAROTOMY;  Surgeon: Armandina Gemma, MD;  Location: [facility];  Service: General;  Laterality: N/A;   MASS EXCISION N/A 12/24/2019   Procedure: EXCISION LIP CARCINOMA;  Surgeon: Leta Baptist, MD;  Location: [place] [facility];  Service: ENT;  Laterality: N/A;   ORIF LEFT ANKLE FX  08/20/2009   distal tib-fib and malleolus   ROTATOR CUFF REPAIR Bilateral right 1994; left 1993, left 1993   TOTAL KNEE ARTHROPLASTY Right 06/02/2013   Procedure: RIGHT TOTAL KNEE ARTHROPLASTY;  Surgeon: Gearlean Alf, MD;  Location: [facility];  Service: Orthopedics;  Laterality: Right;   TOTAL KNEE ARTHROPLASTY Left 01-31-2008   dr Wynelle Link   TOTAL KNEE REVISION Right 01/23/2018   Procedure: right knee polyethylene revision;  Surgeon: Gaynelle Arabian, MD;  Location: [facility];  Service: Orthopedics;  Laterality: Right;  81min   TRANSURETHRAL RESECTION OF PROSTATE N/A 04/21/2019   Procedure: TRANSURETHRAL RESECTION OF THE PROSTATE (TURP);  Surgeon: Franchot Gallo, MD;  Location[facility];  Service: Urology;  Laterality: N/A;   UMBILICAL HERNIA REPAIR   08-11-1999   dr Ninfa Linden   UPPER GASTROINTESTINAL ENDOSCOPY  sept 2016   WOUND EXPLORATION N/A 09/21/2016   Procedure: WOUND EXPLORATION AND DEBRIDEMENT ABDOMINAL WALL;  Surgeon: Armandina Gemma, MD;  Location: [facility];  Service: General;  Laterality: N/A;     Social History:   reports that he quit smoking about 45 years ago. His smoking use included cigarettes. He has a 68.00 pack-year smoking history. He has never used smokeless tobacco. He reports that he does not drink alcohol and does not use drugs.   Family History:  His family history includes Bone cancer in his father; Diabetes in his maternal grandmother; Factor V Leiden deficiency in his daughter; Heart disease in his paternal uncle. There is no history of Colon cancer, Esophageal cancer, Stomach cancer, or Rectal cancer.   Allergies Allergies  Allergen Reactions   Duloxetine Other (See Comments)    Night sweats   Doxycycline Other (See Comments)    Severe nose bleeds   Morphine And Related Nausea And Vomiting, Swelling and Other (See Comments)    Facial swelling and lips swell, but tolerates immediate-release oxycodone as well as hydrocodone    Oxycontin [Oxycodone Hcl] Swelling and Other (See Comments)    Lips swell, but tolerates immediate-release oxycodone as well as hydrocodone   Quinine Other (See Comments)    Platelets dropped- consumptive coagulopathy   Sertraline Other (See Comments)    Can't sleep or urinate   Voltaren [Diclofenac Sodium] Other (See Comments)    Elevated liver enzymes   Diclofenac Swelling and Other (See Comments)    Elevated liver enzymes and lips swell   Doxycycline Hyclate Other (See Comments)    Epistaxis   Duloxetine Hcl Other (See Comments)    Night sweats   Gabapentin Other (See Comments)    Reaction??   Hydrocodone Swelling and Other (See Comments)    Facial swelling- can tolerate "immediate-release" Hydrocodone, though   Oxybutynin Other (See Comments)    Swelling    Sertraline Hcl  Other (See Comments)    Urinary problems and insomnia   Tape Other (See Comments)    SKIN IS FRAGILE- CERTAIN BANDAGES TEAR OFF THE SKIN     Home Medications  Prior to Admission medications   Medication Sig Start Date End Date Taking? Authorizing Provider  apixaban (ELIQUIS) 5 MG TABS tablet Take 1 tablet by mouth twice a day 09/13/20  Yes   AUSTRALIAN DREAM ARTHRITIS 0.025 % CREA Apply 1 application topically 3 (three) times daily as needed (to painful joints).   Yes [provider]  carvedilol (COREG) 25 MG tablet Take one tablet by mouth once daily in the evening. Patient taking differently: Take 25 mg by mouth at bedtime. 07/01/20  Yes   fluorouracil (EFUDEX) 5 % cream Apply 1 application externally twice a day 09/06/20  Yes   furosemide (LASIX) 20 MG tablet Take 20 mg by mouth daily as needed for edema.   Yes [provider]  Homeopathic Products [facility] RELIEF) FOAM Apply 1 application topically daily as needed (for knee cramps).   Yes [provider]  HYDROmorphone (DILAUDID) 8 MG tablet Take 1 tablet by mouth 7 times a day as needed 11/11/20  Yes   hydrOXYzine (VISTARIL) 25 MG capsule Take 1 capsule by mouth every 8 hours as needed for stress 08/24/20  Yes   insulin aspart (NOVOLOG) 100 UNIT/ML injection USE 100U/DAY SUBCUTANEOUS VIA PUMP 30 DAY(S) Patient taking differently: Inject 5-30 Units into the skin See admin instructions. Inject 5-30 units into the skin three times a day before meals, PER SLIDING SCALE 12/23/19 12/22/20 Yes Balan, Bindubal, MD  insulin degludec (TRESIBA FLEXTOUCH) 100 UNIT/ML FlexTouch Pen Inject 14 units Subcutaneous Once a day 30 days Patient taking differently: Inject 14 Units into the skin daily before breakfast. 07/22/20  Yes   lansoprazole (PREVACID) 30 MG capsule Take one capsule by mouth once daily. Patient taking differently: Take 30 mg by mouth at bedtime. 07/01/20  Yes   losartan (COZAAR) 50 MG tablet Take one tablet by mouth  every evening. Patient  taking differently: Take 50 mg by mouth at bedtime. 07/01/20  Yes   magnesium oxide (MAG-OX) 400 MG tablet Take 800 mg by mouth at bedtime. Triple complex   Yes [provider]  megestrol (MEGACE) 40 MG tablet Take one tablet by mouth twice daily. Patient taking differently: Take 40 mg by mouth at bedtime. 07/01/20  Yes   pravastatin (PRAVACHOL) 20 MG tablet Take one tablet by mouth twice weekly. Patient taking differently: Take 20 mg by mouth 2 (two) times a week. 07/01/20  Yes   senna (SENOKOT) 8.6 MG TABS tablet Take 1 tablet by mouth in the morning and at bedtime.   Yes [provider]  triamcinolone cream (KENALOG) 0.1 % Apply externally two times a day for 30 days. 10/07/20  Yes   amoxicillin-clavulanate (AUGMENTIN) 875-125 MG tablet Take 1 tablet by mouth every 12 hours Patient not taking: No sig reported 08/16/20     clindamycin (CLEOCIN) 150 MG capsule Take 2 capsules by mouth now then take 1 capsule 3 times a day until gone Patient not taking: No sig reported 11/01/20     COVID-19 mRNA vaccine, Pfizer, 30 MCG/0.3ML injection INJECT AS DIRECTED Patient not taking: No sig reported 01/05/20 01/04/21  Carlyle Basques, MD  doxycycline (VIBRA-TABS) 100 MG tablet Take 1 tablet by mouth twice a day Patient not taking: No sig reported 09/22/20     finasteride (PROSCAR) 5 MG tablet Take 1 tablet by mouth once daily Patient not taking: No sig reported 10/01/20     insulin aspart (NOVOLOG) 100 UNIT/ML injection Use 100 units daily under the skin via pump Patient not taking: Reported on 11/17/2020 10/11/20     Insulin Syringe-Needle U-100 31G X 5/16" 1 ML MISC USE AS DIRECTED TO INJECT 01/19/20 01/18/21  Jacelyn Pi, MD  nitrofurantoin, macrocrystal-monohydrate, (MACROBID) 100 MG capsule Take 1 capsule by mouth once daily Patient not taking: No sig reported 10/01/20     phenazopyridine (PYRIDIUM) 200 MG tablet Take 1 tablet by mouth every 8 hours as needed for  urinary pain Patient not taking: Reported on 11/17/2020 10/01/20     polyethylene glycol powder (MIRALAX) 17 GM/SCOOP powder Mix 1 scoop (17g) in 8 ounces of water or juice once daily Patient not taking: No sig reported 08/02/20     Turmeric 500 MG CAPS Take 500 mg by mouth at bedtime. Patient not taking: No sig reported    [provider]     Critical care time: 45 min.       [name], DO 11/17/20 10:44 AM [facility]

## 2020-11-17 NOTE — ED Provider Notes (Signed)
[facility] DEPT Provider Note: Georgena Spurling, MD, FACEP  CSN: [id number] MRN: [id number] ARRIVAL: 11/17/20 at 0337 ROOM: WA07/WA07   CHIEF COMPLAINT  Drug Overdose  Level 5 caveat: Altered mental status HISTORY OF PRESENT ILLNESS  11/17/20 3:54 AM Derek [name] is a 75 y.o. male who is a chronic pain patient.  He received a prescription for 210, 8 mg hydromorphone tablets on 11/11/2020.  He was instructed to take 1 tablet up to 7 times daily as needed for pain.  His wife called EMS this morning reporting that the patient had taken an overdose stating he no longer wished to live because of his chronic pain.  He admits the same to me.  Assuming he took the prescribed 7 tablets daily since the prescription was filled on 11/11/2020 (6 days ago), and there are 28 left in the bottle, he could have taken as many as (210 - 6*7 - 28) = 140 tablets.    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" no pulmonary md, sees dr [name]   Factor V Leiden mutation ([place])    "never had blood clots"   GERD (gastroesophageal reflux disease)    Hiatal hernia    History of kidney stones    multiple kidney stones-not a problem at present   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, pT1 pNX) no further treatment   History of panic attacks    History 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    Past Surgical History:  Procedure Laterality Date   ANAL FISTULECTOMY N/A 04/01/2013   Procedure: EXAM UNDER ANESTHESIA WITH ANAL FISSURectomy, sphincterotomy and internal hemorrhoidectomy;  Surgeon: Harl Bowie, MD;  Location: [facility];  Service: General;  Laterality: N/A;   APPLICATION OF WOUND VAC N/A 05/16/2015   Procedure: APPLICATION OF WOUND VAC;  Surgeon: Armandina Gemma, MD;  Location: [facility];  Service: General;  Laterality: N/A;   APPLICATION OF WOUND VAC N/A 05/20/2015   Procedure: EXHANGE OF ABDOMINAL  WOUND VAC DRESSING;  Surgeon: Armandina Gemma, MD;  Location: [facility];  Service: General;  Laterality: N/A;  COLONOSCOPY     CYSTOSCOPY WITH INSERTION OF UROLIFT N/A 10/12/2016   Procedure: CYSTOSCOPY WITH INSERTION OF UROLIFT;  Surgeon: Franchot Gallo, MD;  Location[facility];  Service: Urology;  Laterality: N/A;   EUS N/A 12/31/2014   Procedure: UPPER ENDOSCOPIC ULTRASOUND (EUS) LINEAR;  Surgeon: Milus Banister, MD;  Location[facility];  Service: Endoscopy;  Laterality: N/A;   HARDWARE REMOVAL Left 2013   left ankle   HEMORRHOIDECTOMY WITH HEMORRHOID BANDING     INCISION AND DRAINAGE ABSCESS N/A 05/14/2015   Procedure: DRAINAGE OF INFECTED PANCREATIC PSEUDOCYST;  Surgeon: Armandina Gemma, MD;  Location: [facility];  Service: General;  Laterality: N/A;   INCISION AND DRAINAGE OF WOUND N/A 05/16/2015   Procedure: IRRIGATION AND DEBRIDEMENT WOUND;   Surgeon: Armandina Gemma, MD;  Location: [facility];  Service: General;  Laterality: N/A;   INCISIONAL HERNIA REPAIR N/A 11/12/2015   Procedure: REPAIR INCISIONAL HERNIA WITH MESH;  Surgeon: Armandina Gemma, MD;  Location: [place];  Service: General;  Laterality: N/A;   INGUINAL HERNIA REPAIR Right 1974;  [place] N/A 11/12/2015   Procedure: INSERTION OF MESH;  Surgeon: Armandina Gemma, MD;  Location: [facility];  Service: General;  Laterality: N/A;   IR GENERIC HISTORICAL  04/20/2015   IR RADIOLOGIST EVAL & MGMT 04/20/2015 [facility] INTERV RAD   KNEE ARTHROSCOPY Bilateral right 08-08-2000;  left 11-23-2000   meniscal repair and chondroplasty's   KNEE ARTHROSCOPY  03/01/2012   Procedure: ARTHROSCOPY KNEE;  Surgeon: Gearlean Alf, MD;  [facility]  Service: Orthopedics;  Laterality: Left;  WITH SYNOVECTOMY    LAPAROTOMY N/A 05/14/2015   Procedure: EXPLORATORY LAPAROTOMY;  Surgeon: Armandina Gemma, MD;  Location[facility];  Service: General;  Laterality: N/A;   MASS EXCISION N/A 12/24/2019   Procedure: EXCISION LIP CARCINOMA;  Surgeon: Leta Baptist, MD;  Location: [facility];  Service: ENT;  Laterality: N/A;   ORIF LEFT ANKLE FX  08/20/2009   distal tib-fib and malleolus   ROTATOR CUFF REPAIR Bilateral right 1994; left 1993, left 1993   TOTAL KNEE ARTHROPLASTY Right 06/02/2013   Procedure: RIGHT TOTAL KNEE ARTHROPLASTY;  Surgeon: Gearlean Alf, MD;  Location[facility];  Service: Orthopedics;  Laterality: Right;   TOTAL KNEE ARTHROPLASTY Left 01-31-2008   dr Wynelle Link   TOTAL KNEE REVISION Right 01/23/2018   Procedure: right knee polyethylene revision;  Surgeon: Gaynelle Arabian, MD;  Location[facility];  Service: Orthopedics;  Laterality: Right;  18min   TRANSURETHRAL RESECTION OF PROSTATE N/A 04/21/2019   Procedure: TRANSURETHRAL RESECTION OF THE PROSTATE (TURP);  Surgeon: Franchot Gallo, MD;  Location: [facility];  Service: Urology;  Laterality: N/A;   UMBILICAL HERNIA REPAIR   08-11-1999   dr Ninfa Linden   UPPER GASTROINTESTINAL ENDOSCOPY  sept 2016   WOUND EXPLORATION N/A 09/21/2016   Procedure: WOUND EXPLORATION AND DEBRIDEMENT ABDOMINAL WALL;  Surgeon: Armandina Gemma, MD;  Location[facility];  Service: General;  Laterality: N/A;    Family History  Problem Relation Age of Onset   Bone cancer Father        Jaw   Factor V Leiden deficiency Daughter    Diabetes Maternal Grandmother    Heart disease Paternal Uncle    Colon cancer Neg Hx    Esophageal cancer Neg Hx    Stomach cancer Neg Hx    Rectal cancer Neg Hx     Social History   Tobacco Use   Smoking  status: Former    Packs/day: 4.00    Years: 17.00    Pack years: 68.00    Types: Cigarettes    Quit date: 02/21/1975    Years since quitting: 45.7   Smokeless tobacco: Never  Vaping Use   Vaping Use: Never used  Substance Use Topics   Alcohol use: No    Alcohol/week: 0.0 standard drinks   Drug use: No    Prior to Admission medications   Medication Sig Start Date End Date Taking? Authorizing Provider  apixaban (ELIQUIS) 5 MG TABS tablet Take 1 tablet by mouth twice a day 09/13/20  Yes   AUSTRALIAN DREAM ARTHRITIS 0.025 % CREA Apply 1 application topically 3 (three) times daily as needed (to painful joints).   Yes [provider]  carvedilol (COREG) 25 MG tablet Take one tablet by mouth once daily in the evening. Patient taking differently: Take 25 mg by mouth at bedtime. 07/01/20  Yes   fluorouracil (EFUDEX) 5 % cream Apply 1 application externally twice a day 09/06/20  Yes   furosemide (LASIX) 20 MG tablet Take 20 mg by mouth daily as needed for edema.   Yes [provider]  Homeopathic Products [facility] RELIEF) FOAM Apply 1 application topically daily as needed (for knee cramps).   Yes [provider]  HYDROmorphone (DILAUDID) 8 MG tablet Take 1 tablet by mouth 7 times a day as needed 11/11/20  Yes   hydrOXYzine (VISTARIL) 25 MG capsule Take 1 capsule by mouth every 8 hours as  needed for stress 08/24/20  Yes   insulin aspart (NOVOLOG) 100 UNIT/ML injection USE 100U/DAY SUBCUTANEOUS VIA PUMP 30 DAY(S) Patient taking differently: Inject 5-30 Units into the skin See admin instructions. Inject 5-30 units into the skin three times a day before meals, PER SLIDING SCALE 12/23/19 12/22/20 Yes Balan, Bindubal, MD  insulin degludec (TRESIBA FLEXTOUCH) 100 UNIT/ML FlexTouch Pen Inject 14 units Subcutaneous Once a day 30 days Patient taking differently: Inject 14 Units into the skin daily before breakfast. 07/22/20  Yes   lansoprazole (PREVACID) 30 MG capsule Take one capsule by mouth once daily. Patient taking differently: Take 30 mg by mouth at bedtime. 07/01/20  Yes   losartan (COZAAR) 50 MG tablet Take one tablet by mouth every evening. Patient taking differently: Take 50 mg by mouth at bedtime. 07/01/20  Yes   magnesium oxide (MAG-OX) 400 MG tablet Take 800 mg by mouth at bedtime. Triple complex   Yes [provider]  megestrol (MEGACE) 40 MG tablet Take one tablet by mouth twice daily. Patient taking differently: Take 40 mg by mouth at bedtime. 07/01/20  Yes   pravastatin (PRAVACHOL) 20 MG tablet Take one tablet by mouth twice weekly. Patient taking differently: Take 20 mg by mouth 2 (two) times a week. 07/01/20  Yes   senna (SENOKOT) 8.6 MG TABS tablet Take 1 tablet by mouth in the morning and at bedtime.   Yes [provider]  triamcinolone cream (KENALOG) 0.1 % Apply externally two times a day for 30 days. 10/07/20  Yes   amoxicillin-clavulanate (AUGMENTIN) 875-125 MG tablet Take 1 tablet by mouth every 12 hours Patient not taking: No sig reported 08/16/20     clindamycin (CLEOCIN) 150 MG capsule Take 2 capsules by mouth now then take 1 capsule 3 times a day until gone Patient not taking: No sig reported 11/01/20     COVID-19 mRNA vaccine, Pfizer, 30 MCG/0.3ML injection INJECT AS DIRECTED Patient not taking: No sig reported 01/05/20  01/04/21  Carlyle Basques, MD   doxycycline (VIBRA-TABS) 100 MG tablet Take 1 tablet by mouth twice a day Patient not taking: No sig reported 09/22/20     finasteride (PROSCAR) 5 MG tablet Take 1 tablet by mouth once daily Patient not taking: No sig reported 10/01/20     insulin aspart (NOVOLOG) 100 UNIT/ML injection Use 100 units daily under the skin via pump Patient not taking: Reported on 11/17/2020 10/11/20     Insulin Syringe-Needle U-100 31G X 5/16" 1 ML MISC USE AS DIRECTED TO INJECT 01/19/20 01/18/21  Jacelyn Pi, MD  nitrofurantoin, macrocrystal-monohydrate, (MACROBID) 100 MG capsule Take 1 capsule by mouth once daily Patient not taking: No sig reported 10/01/20     phenazopyridine (PYRIDIUM) 200 MG tablet Take 1 tablet by mouth every 8 hours as needed for urinary pain Patient not taking: Reported on 11/17/2020 10/01/20     polyethylene glycol powder (MIRALAX) 17 GM/SCOOP powder Mix 1 scoop (17g) in 8 ounces of water or juice once daily Patient not taking: No sig reported 08/02/20     Turmeric 500 MG CAPS Take 500 mg by mouth at bedtime. Patient not taking: No sig reported    [provider]    Allergies Duloxetine, Doxycycline, Morphine and related, Oxycontin [oxycodone hcl], Quinine, Sertraline, Voltaren [diclofenac sodium], Diclofenac, Doxycycline hyclate, Duloxetine hcl, Gabapentin, Hydrocodone, Oxybutynin, Sertraline hcl, and Tape   REVIEW OF SYSTEMS  Level 5 caveat   PHYSICAL EXAMINATION  Initial Vital Signs Blood pressure (!) 167/87, pulse 79, temperature 97.8 F (36.6 C), resp. rate 11, SpO2 94 %.  Examination General: Well-developed, well-nourished male in no acute distress; appearance consistent with age of record HENT: normocephalic; atraumatic Eyes: pupils equal, round, 2 mm, sluggish; extraocular muscles grossly intact Neck: supple Heart: regular rate and rhythm Lungs: clear to auscultation bilaterally Abdomen: soft; nondistended; nontender; bowel sounds present Extremities: No  deformity; full range of motion; trace edema of lower leg Neurologic: Awake but lethargic; will answer simple questions; motor function intact in all extremities and symmetric; no facial droop Skin: Warm and dry Psychiatric: Depressed mood with congruent affect; +suicidal ideation; +suicidal plan   RESULTS  Summary of this visit's results, reviewed and interpreted by myself:   EKG Interpretation  Date/Time:  Wednesday November 17 2020 03:56:11 EDT Ventricular Rate:  79 PR Interval:  196 QRS Duration: 126 QT Interval:  396 QTC Calculation: 454 R Axis:   -43 Text Interpretation: Sinus rhythm RBBB and LAFB No significant change was found Confirmed by Aylani Spurlock, Jenny Reichmann [id number]) on 11/17/2020 4:02:48 AM       Laboratory Studies: Results for orders placed or performed during the hospital encounter of 11/17/20 (from the past 24 hour(s))  Ethanol     Status: None   Collection Time: 11/17/20  4:00 AM  Result Value Ref Range   Alcohol, Ethyl (B) <65 <99 mg/dL  Salicylate level     Status: Abnormal   Collection Time: 11/17/20  4:00 AM  Result Value Ref Range   Salicylate Lvl <3.5 (L) 7.0 - 30.0 mg/dL  Acetaminophen level     Status: Abnormal   Collection Time: 11/17/20  4:00 AM  Result Value Ref Range   Acetaminophen (Tylenol), Serum <10 (L) 10 - 30 ug/mL  CBC with Differential/Platelet     Status: Abnormal   Collection Time: 11/17/20  4:01 AM  Result Value Ref Range   WBC 10.3 4.0 - 10.5 K/uL   RBC 3.84 (L) 4.22 - 5.81 MIL/uL   Hemoglobin 11.7 (  L) 13.0 - 17.0 g/dL   HCT 35.2 (L) 39.0 - 52.0 %   MCV 91.7 80.0 - 100.0 fL   MCH 30.5 26.0 - 34.0 pg   MCHC 33.2 30.0 - 36.0 g/dL   RDW 13.3 11.5 - 15.5 %   Platelets 364 150 - 400 K/uL   nRBC 0.0 0.0 - 0.2 %   Neutrophils Relative % 55 %   Neutro Abs 5.6 1.7 - 7.7 K/uL   Lymphocytes Relative 29 %   Lymphs Abs 2.9 0.7 - 4.0 K/uL   Monocytes Relative 13 %   Monocytes Absolute 1.4 (H) 0.1 - 1.0 K/uL   Eosinophils Relative 2 %    Eosinophils Absolute 0.2 0.0 - 0.5 K/uL   Basophils Relative 1 %   Basophils Absolute 0.1 0.0 - 0.1 K/uL   Immature Granulocytes 0 %   Abs Immature Granulocytes 0.03 0.00 - 0.07 K/uL  Basic metabolic panel     Status: Abnormal   Collection Time: 11/17/20  4:01 AM  Result Value Ref Range   Sodium 136 135 - 145 mmol/L   Potassium 4.3 3.5 - 5.1 mmol/L   Chloride 109 98 - 111 mmol/L   CO2 19 (L) 22 - 32 mmol/L   Glucose, Bld 347 (H) 70 - 99 mg/dL   BUN 29 (H) 8 - 23 mg/dL   Creatinine, Ser 1.15 0.61 - 1.24 mg/dL   Calcium 11.2 (H) 8.9 - 10.3 mg/dL   GFR, Estimated >60 >60 mL/min   Anion gap 8 5 - 15  Resp Panel by RT-PCR (Flu A&B, Covid) Nasopharyngeal Swab     Status: None   Collection Time: 11/17/20  4:59 AM   Specimen: Nasopharyngeal Swab; Nasopharyngeal(NP) swabs in vial transport medium  Result Value Ref Range   SARS Coronavirus 2 by RT PCR NEGATIVE NEGATIVE   Influenza A by PCR NEGATIVE NEGATIVE   Influenza B by PCR NEGATIVE NEGATIVE   Imaging Studies: No results found.  ED COURSE and MDM  Nursing notes, initial and subsequent vitals signs, including pulse oximetry, reviewed and interpreted by myself.  Vitals:   11/17/20 0430 11/17/20 0500 11/17/20 0530 11/17/20 0600  BP: (!) 200/95 (!) 161/86 137/77 (!) 149/83  Pulse: (!) 102 71 76 73  Resp: 20 (!) 24 14 15   Temp:      SpO2: 97% 98% 96% 100%   Medications  naloxone HCl (NARCAN) 4 mg in dextrose 5 % 250 mL infusion (0.5 mg/hr Intravenous New Bag/Given 11/17/20 0421)   4:16 AM Discussed with poison control.  They recommend Narcan drip (already ordered at 0.5 mg/h) with goal to maintain respirations but not necessarily awakeness and alertness as long as blood pressure is maintained.  We will also check for alcohol, other drugs of abuse and salicylate and acetaminophen toxicity.  PROCEDURES  Procedures CRITICAL CARE Performed by: Karen Chafe Ikaika Showers Total critical care time: 30 minutes Critical care time was exclusive of  separately billable procedures and treating other patients. Critical care was necessary to treat or prevent imminent or life-threatening deterioration. Critical care was time spent personally by me on the following activities: development of treatment plan with patient and/or surrogate as well as nursing, discussions with consultants, evaluation of patient's response to treatment, examination of patient, obtaining history from patient or surrogate, ordering and performing treatments and interventions, ordering and review of laboratory studies, ordering and review of radiographic studies, pulse oximetry and re-evaluation of patient's condition.   ED DIAGNOSES     ICD-10-CM  1. Intentional opiate overdose, initial encounter ([place])  T40.602A     2. Suicide attempt [facility])  T14.91XA          Shanon Rosser, MD 11/17/20 [phone]

## 2020-11-17 NOTE — Consult Note (Signed)
[facility] New Psychiatric Evaluation   Service Date: November 17, 2020 LOS:  LOS: 0 days    Assessment  Derek [name] is a 75 y.o. male admitted medically for 11/17/2020  3:38 AM for an overdose on approximately 800 mg of dilaudid. He has no psychiatric diagnoses and has an extensive past medical history pertinent for chronic pain and recent DVT; see PMH below which I have reviewed. Psychiatry was consulted for suicide attempt by Derek [name] and Derek [name] (2 orders placed).    His current presentation of a serious suicide attempt requiring ICU stay and narcan infusion is most consistent with MDD/severe; unclear whether single untreated episode or recurrent. He has no medications and has not seen a psychiatrist.  He does meet criteria for MDD (anhedonia, sleep, energy most significant on exam). 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 On initial examination, patient stated that he regretted his attempt and is "glad" things worked out the way they did. He is embarrassedly about some of what we discussed and ask that I attempt to block this note (option not enabled for my account); have removed some sensitive information not necessary for clinical decision making. We discussed inpatient psychiatric hospitalization which he is considering. Did not discuss IVC. Please see plan below for detailed recommendations.   Diagnoses:  Active Hospital problems: Active Problems:   Overdose, intentional self-harm, initial encounter ([name])    Problems edited/added by me: No problems updated.  Plan  ## Safety and Observation Level:  - Based on my clinical evaluation, I estimate the patient to be at high risk of self harm in the current setting - At this time, we recommend a 1:1 sitter level of observation. This decision is based on my review of the chart including patient's history and current presentation, interview of  the patient, mental status examination, and consideration of suicide risk including evaluating suicidal ideation, plan, intent, suicidal or self-harm behaviors, risk factors, and protective factors. This judgment is based on our ability to directly address suicide risk, implement suicide prevention strategies and develop a safety plan while the patient is in the clinical setting. Please contact our team if there is a concern that risk level has changed.   -- pt is not threatening to leave AMA; please IVC if he threatens to leave. Planning on inpt psych hospitalization.   ## Medications:  -- none at this time  -- opioid withdrawal comfort measures per primary team; common measures include: loperamide for GI distress, hydroxyzine for anxiety,   -- consider initiating suboxone tomorrow; palliative medicine consult. Would need frequent dosing (q4-6) during induction 2/2 chronic pain  ## Medical Decision Making Capacity:  Not formally assessed at this time  ## Further Work-up:  -- please add on TSH, B12, u/a; otherwise per ICU team     ## Disposition:  -- psychiatric hospitalization  ## Behavioral / Environmental:  -- delirium precautions  ##Legal Status -- currently voluntary   Thank you for this consult request. Recommendations have been communicated to the primary team.  We will contiue to follow at this time.   [place] A Derek [name]    NEW  Relevant Aspects of Hospital Course:  Admitted on 11/17/2020 for intentional OD on 800 mg dilaudid; required ICU admission and narcan infusion which is being tapered off.  Patient Report:  Alert & oriented, difficult to assess orientation (jokes about being in the "basement" but clear he meant emotionally),  Knows month, year, not date. 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 SAVEAHAART with one error which he quickly self-corrects.  Pt aware that psychiatry was asked to see him because of his suicide attempt.  Right now he feels "OK" about being alive and is sort of glad that it happened the way it did. Patient has chronic severe pain (back, shoulders, knees- numerous surgeries over 30). It got to the point he could not handle the pain anymore. Right now his pain is pretty bad - about 9-10. His marriage has been going downhill for the last 10 years and he has never discussed it with anyone. Has been married for 69 years - first 11 years were wonderful. His wife started pulling away from him when he was 60 and it knocked him for a loop. This has been eating away at him - has never discussed this with anyone besides one of his sisters. He can't move out as he can't take care of himself - doesn't think he would leave anyway. Has 2 kids in early 85s who he has a poor relationship with. Wife and kids know he is here - daughter and son refused to come visit. Becomes tearful many times throughout conversation.   It has been overwhelming - got to a point he was thinking about overdosing (1-2 years). Pain and loneliness were acute triggers.   Currently not willing to go to psych hospital, vague paranoia ("then my wife and kids will know") although narratively they already know he is in the hospital for a suicide attempt.    Mood Mood:  Endorses sadness/low mood which does not improve when good events occur and has lasted for a period of 10 years Sleep: very poor, 3 good days in last 4-5 weeks Interest/motivation: Has virtually no interest in formerly enjoyable activities hopelessness/helplessness: endorses activity level/energy: Makes significant personal effort to initiate or carry out usual daily activities concentration: Normal/usual appetite: No change from usual appetite psychomotor:Normal speed of thinking, gesturing, speaking suicidality: denied during interview homicidality: Denies irritability: endorses "I'm an angry person all of the time". On interview interview irritability appears to be  Absent    Overall he is pleasant and disinhibited.   ROS:  Back pain, chronic pain, diarrhea, (normally has constipation)  Collateral information:  OK to call wife. Not OK to discuss conversation from 10 years ago.   Attempted to call 9/28 no response  Psychiatric History:  Information collected from patient Has never seen a psychiatrist before Has not been on psych meds for psych reasons - duloxetine makes him sick Never seen a psychiatrist Never tried to kill self before   Family psych history: daughter has a lot of depression.  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" no pulmonary md, sees dr [name]   Factor V Leiden mutation ([place])    "never had blood clots"   GERD (gastroesophageal reflux disease)    Hiatal hernia    History of kidney stones    multiple kidney stones-not a problem  at present   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, pT1 pNX) no further treatment   History of panic attacks    History of traumatic head injury    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    Surgical History: Past Surgical History:  Procedure Laterality Date   ANAL FISTULECTOMY N/A 04/01/2013   Procedure: EXAM UNDER ANESTHESIA WITH ANAL FISSURectomy, sphincterotomy and internal hemorrhoidectomy;  Surgeon: Harl Bowie, MD;  Location: [facility];  Service: General;  Laterality: N/A;   APPLICATION OF WOUND VAC N/A 05/16/2015   Procedure: APPLICATION OF WOUND VAC;  Surgeon: Armandina Gemma, MD;  Location: [facility];  Service: General;  Laterality: N/A;   APPLICATION OF WOUND VAC N/A 05/20/2015   Procedure: EXHANGE OF ABDOMINAL  WOUND VAC DRESSING;  Surgeon: Armandina Gemma, MD;  Location: [facility];  Service: General;  Laterality: N/A;   COLONOSCOPY     CYSTOSCOPY WITH INSERTION OF UROLIFT N/A 10/12/2016   Procedure: CYSTOSCOPY WITH INSERTION OF UROLIFT;  Surgeon: Franchot Gallo, MD;  [facility];  Service: Urology;  Laterality: N/A;   EUS N/A 12/31/2014   Procedure: UPPER ENDOSCOPIC ULTRASOUND (EUS) LINEAR;  Surgeon: Milus Banister, MD;  [facility];  Service: Endoscopy;  Laterality: N/A;   HARDWARE REMOVAL Left 2013   left ankle   HEMORRHOIDECTOMY WITH HEMORRHOID BANDING     INCISION AND DRAINAGE ABSCESS N/A 05/14/2015   Procedure: DRAINAGE OF INFECTED PANCREATIC PSEUDOCYST;  Surgeon: Armandina Gemma, MD;  [facility];  Service: General;  Laterality: N/A;   INCISION AND DRAINAGE OF WOUND N/A 05/16/2015   Procedure: IRRIGATION AND DEBRIDEMENT WOUND;  Surgeon: Armandina Gemma, MD;  [facility];  Service: General;  Laterality: N/A;   INCISIONAL HERNIA REPAIR N/A 11/12/2015   Procedure: REPAIR INCISIONAL HERNIA WITH MESH;  Surgeon: Armandina Gemma, MD;  Location: [facility];  Service: General;  Laterality: N/A;   INGUINAL HERNIA REPAIR  Right 1974;  [place] N/A 11/12/2015   Procedure: INSERTION OF MESH;  Surgeon: Armandina Gemma, MD;  Location: [facility];  Service: General;  Laterality: N/A;   IR GENERIC HISTORICAL  04/20/2015   IR RADIOLOGIST EVAL & MGMT 04/20/2015 [facility] INTERV RAD   KNEE ARTHROSCOPY Bilateral right 08-08-2000;  left 11-23-2000   meniscal repair and chondroplasty's   KNEE ARTHROSCOPY  03/01/2012   Procedure: ARTHROSCOPY KNEE;  Surgeon: Gearlean Alf, MD;  Location: [facility];  Service: Orthopedics;  Laterality: Left;  WITH SYNOVECTOMY    LAPAROTOMY N/A 05/14/2015   Procedure: EXPLORATORY LAPAROTOMY;  Surgeon: Armandina Gemma, MD;  [facility];  Service: General;  Laterality: N/A;   MASS EXCISION N/A 12/24/2019   Procedure: EXCISION LIP CARCINOMA;  Surgeon: Leta Baptist, MD;  Location: [facility];  Service: ENT;  Laterality: N/A;   ORIF LEFT ANKLE FX  08/20/2009   distal tib-fib and malleolus   ROTATOR CUFF REPAIR Bilateral right 1994; left 1993, left 1993   TOTAL KNEE ARTHROPLASTY Right 06/02/2013   Procedure: RIGHT TOTAL KNEE ARTHROPLASTY;  Surgeon: Gearlean Alf, MD;  [facility];  Service: Orthopedics;  Laterality: Right;   TOTAL KNEE ARTHROPLASTY Left 01-31-2008   dr Wynelle Link   TOTAL KNEE REVISION Right 01/23/2018   Procedure: right knee polyethylene revision;  Surgeon: Gaynelle Arabian, MD;  [facility];  Service: Orthopedics;  Laterality: Right;  65min   TRANSURETHRAL RESECTION OF PROSTATE N/A 04/21/2019   Procedure: TRANSURETHRAL RESECTION OF THE PROSTATE (TURP);  Surgeon: Franchot Gallo, MD;  Location: [facility] [place];  Service: Urology;  Laterality: N/A;   UMBILICAL HERNIA REPAIR  08-11-1999   dr Ninfa Linden   UPPER GASTROINTESTINAL ENDOSCOPY  sept 2016   WOUND EXPLORATION N/A 09/21/2016   Procedure: WOUND EXPLORATION AND DEBRIDEMENT ABDOMINAL WALL;  Surgeon: Armandina Gemma, MD;  [facility];  Service: General;  Laterality: N/A;    Medications:   Current  Facility-Administered Medications:    carvedilol (COREG) tablet 25 mg, 25 mg, Oral, QHS, [name], Laura P, DO   Chlorhexidine Gluconate Cloth 2 % PADS 6 each, 6 each, Topical, Q0600, Chotiner, Yevonne Aline, MD   enoxaparin (LOVENOX) injection 114 mg, 1 mg/kg, Subcutaneous, BID, [name], DO, 114 mg at 11/17/20 1216   furosemide (LASIX) tablet 20 mg, 20 mg, Oral, Daily PRN, Derek [name] P, DO   glucagon (human recombinant) (GLUCAGEN) injection 1 mg, 1 mg, Intravenous, Once PRN, Derek [name] P, DO   insulin aspart (novoLOG) injection 0-15 Units, 0-15 Units, Subcutaneous, Q4H, [name], DO, 8 Units at 11/17/20 1202   insulin glargine-yfgn (SEMGLEE) injection 14 Units, 14 Units, Subcutaneous, Daily, Carlis Abbott, Laura P, DO   losartan (COZAAR) tablet 50 mg, 50 mg, Oral, QHS, [name], Laura P, DO   magnesium oxide (MAG-OX) tablet 800 mg, 800 mg, Oral, QHS, Chotiner, Yevonne Aline, MD   MEDLINE mouth rinse, 15 mL, Mouth Rinse, BID, Chotiner, Yevonne Aline, MD   pantoprazole (PROTONIX) EC tablet 20 mg, 20 mg, Oral, Daily, Carlis Abbott, Laura P, DO   [START ON 11/18/2020] pravastatin (PRAVACHOL) tablet 20 mg, 20 mg, Oral, Once per day on Mon Thu, [name], Laura P, DO   promethazine (PHENERGAN) 6.25 mg in sodium chloride 0.9 % 50 mL IVPB, 6.25 mg, Intravenous, Q6H PRN, [name], DO, Stopped at 11/17/20 1007   senna (SENOKOT) tablet 8.6 mg, 1 tablet, Oral, Daily, [name], DO  Allergies: Allergies  Allergen Reactions   Duloxetine Other (See Comments)    Night sweats   Doxycycline Other (See Comments)    Severe nose bleeds   Morphine And Related Nausea And Vomiting, Swelling and Other (See Comments)    Facial swelling and lips swell, but tolerates immediate-release oxycodone as well as hydrocodone    Oxycontin [Oxycodone Hcl] Swelling and Other (See Comments)    Lips swell, but tolerates immediate-release oxycodone as well as hydrocodone   Quinine Other (See Comments)    Platelets dropped- consumptive  coagulopathy   Sertraline Other (See Comments)    Can't sleep or urinate   Voltaren [Diclofenac Sodium] Other (See Comments)  Elevated liver enzymes   Diclofenac Swelling and Other (See Comments)    Elevated liver enzymes and lips swell   Doxycycline Hyclate Other (See Comments)    Epistaxis   Duloxetine Hcl Other (See Comments)    Night sweats   Gabapentin Other (See Comments)    Reaction??   Hydrocodone Swelling and Other (See Comments)    Facial swelling- can tolerate "immediate-release" Hydrocodone, though   Oxybutynin Other (See Comments)    Swelling    Sertraline Hcl Other (See Comments)    Urinary problems and insomnia   Tape Other (See Comments)    SKIN IS FRAGILE- CERTAIN BANDAGES TEAR OFF THE SKIN    Social History:  Married for 53 years  Tobacco use: denies Alcohol use: denies Drug use: denies  Family History: daughter with depression.  The patient's family history includes Bone cancer in his father; Diabetes in his maternal grandmother; Factor V Leiden deficiency in his daughter; Heart disease in his paternal uncle.    Objective  Vital signs:  Temp:  [97.8 F (36.6 C)-98 F (36.7 C)] 98 F (36.7 C) (09/28 1200) Pulse Rate:  [70-102] 75 (09/28 0932) Resp:  [11-24] 19 (09/28 0730) BP: (137-200)/(69-178) 151/69 (09/28 0730) SpO2:  [94 %-100 %] 99 % (09/28 0932)  Physical Exam: Gen: alert, largely oriented, some issues with attention Pulm: no increased WOB Skin: no rash, lesions, piloerection noted Neuro: CNII-CXII grossly intact  Mental Status Exam: Appearance: Lying in bed, ill appearing, hospital attire  Attitude:  Cooperative, easily distracted  Behavior/Psychomotor: Normal speed and amount of gesturing  Speech/Language:  Tone, volume, spontaneity, prosody wnl  Mood: "Glad that things went the way they did"  Affect: labile  Thought process: Goal directed, some loosening of associations  Thought content:   Devoid of current suicidal or  homicidal content, concerned varying on paranoid about this conversation's secrecy  Perceptual disturbances:  AH/VH not endorsed, not RIS  Attention: See above; difficulty participating in complicated attention testing  Concentration: Some difficulty concentrating to conversation at hand  Orientation: Oriented to self, situation, month, difficulties with date, covered for ?deficits with location with jokes  Memory: Recent/remote appeared to be intact  Fund of knowledge:  Not formally assessed  Insight:   poor  Judgment:  Very poor  Impulse Control: Very poor     Data Reviewed: PHQ2 did not screen + in 11/21

## 2020-11-17 NOTE — ED Provider Notes (Signed)
7:30 AM-currently patient is wide-awake, alert, sitting up and requesting breakfast.  Vital signs are normal/stable.  He is on a Narcan drip at 0.5 mg every hour.  He admits suicidal attempt due to chronic pain.  He states this is his first time doing that.  Currently we are awaiting stabilization.  The time hospitalist declined to admit the patient when he was contacted by Dr. Florina Ou around 5 AM.  It appears as a hospitalist saw the patient because he ordered a psychiatric consultation.  However there is no note documented about his assessment and/or declination.  At this time the patient appears stable for admission by hospitalist service.  Currently awaiting call from intensivist to confirm that.  7:44 AM-Dr. Lamonte Sakai will have the intensivist team evaluate the patient.    Daleen Bo, MD 11/17/20 [phone]

## 2020-11-18 ENCOUNTER — Inpatient Hospital Stay (HOSPITAL_COMMUNITY): Payer: HMO

## 2020-11-18 ENCOUNTER — Encounter (HOSPITAL_COMMUNITY): Payer: Self-pay | Admitting: Critical Care Medicine

## 2020-11-18 DIAGNOSIS — I152 Hypertension secondary to endocrine disorders: Secondary | ICD-10-CM

## 2020-11-18 DIAGNOSIS — Z7901 Long term (current) use of anticoagulants: Secondary | ICD-10-CM | POA: Diagnosis not present

## 2020-11-18 DIAGNOSIS — T40602A Poisoning by unspecified narcotics, intentional self-harm, initial encounter: Secondary | ICD-10-CM | POA: Diagnosis not present

## 2020-11-18 DIAGNOSIS — E1159 Type 2 diabetes mellitus with other circulatory complications: Secondary | ICD-10-CM | POA: Diagnosis not present

## 2020-11-18 DIAGNOSIS — F322 Major depressive disorder, single episode, severe without psychotic features: Secondary | ICD-10-CM | POA: Diagnosis not present

## 2020-11-18 DIAGNOSIS — T50902A Poisoning by unspecified drugs, medicaments and biological substances, intentional self-harm, initial encounter: Secondary | ICD-10-CM | POA: Diagnosis not present

## 2020-11-18 DIAGNOSIS — T1491XA Suicide attempt, initial encounter: Secondary | ICD-10-CM | POA: Diagnosis not present

## 2020-11-18 LAB — GLUCOSE, CAPILLARY
Glucose-Capillary: 151 mg/dL — ABNORMAL HIGH (ref 70–99)
Glucose-Capillary: 173 mg/dL — ABNORMAL HIGH (ref 70–99)
Glucose-Capillary: 206 mg/dL — ABNORMAL HIGH (ref 70–99)
Glucose-Capillary: 220 mg/dL — ABNORMAL HIGH (ref 70–99)
Glucose-Capillary: 248 mg/dL — ABNORMAL HIGH (ref 70–99)
Glucose-Capillary: 375 mg/dL — ABNORMAL HIGH (ref 70–99)

## 2020-11-18 MED ORDER — HYDROXYZINE HCL 10 MG PO TABS
10.0000 mg | ORAL_TABLET | Freq: Three times a day (TID) | ORAL | Status: DC | PRN
  Administered 2020-11-19 – 2020-11-22 (×3): 10 mg via ORAL
  Filled 2020-11-18 (×7): qty 1

## 2020-11-18 MED ORDER — PREGABALIN 25 MG PO CAPS
25.0000 mg | ORAL_CAPSULE | Freq: Every day | ORAL | Status: DC
  Administered 2020-11-18 – 2020-11-29 (×11): 25 mg via ORAL
  Filled 2020-11-18 (×12): qty 1

## 2020-11-18 MED ORDER — IOHEXOL 350 MG/ML SOLN
80.0000 mL | Freq: Once | INTRAVENOUS | Status: AC | PRN
  Administered 2020-11-19: 80 mL via INTRAVENOUS

## 2020-11-18 MED ORDER — THERAWORX RELIEF EX LIQD: 1.0000 "application " | Freq: Every day | CUTANEOUS | Status: DC | PRN

## 2020-11-18 MED ORDER — ACETAMINOPHEN 325 MG PO TABS
650.0000 mg | ORAL_TABLET | Freq: Four times a day (QID) | ORAL | Status: DC | PRN
  Administered 2020-11-18 – 2020-11-27 (×9): 650 mg via ORAL
  Filled 2020-11-18 (×10): qty 2

## 2020-11-18 MED ORDER — CALCIUM CARBONATE ANTACID 500 MG PO CHEW
1.0000 | CHEWABLE_TABLET | Freq: Two times a day (BID) | ORAL | Status: DC | PRN
  Administered 2020-11-18: 200 mg via ORAL
  Filled 2020-11-18: qty 1

## 2020-11-18 MED ORDER — APIXABAN 5 MG PO TABS
5.0000 mg | ORAL_TABLET | Freq: Two times a day (BID) | ORAL | Status: DC
  Administered 2020-11-18 – 2020-11-29 (×23): 5 mg via ORAL
  Filled 2020-11-18 (×24): qty 1

## 2020-11-18 MED ORDER — SODIUM CHLORIDE 0.9 % IV SOLN
1.0000 g | INTRAVENOUS | Status: AC
  Administered 2020-11-18 – 2020-11-19 (×2): 1 g via INTRAVENOUS
  Filled 2020-11-18 (×4): qty 10

## 2020-11-18 MED ORDER — MAGNESIUM SULFATE 2 GM/50ML IV SOLN
2.0000 g | Freq: Once | INTRAVENOUS | Status: AC
  Administered 2020-11-18: 2 g via INTRAVENOUS
  Filled 2020-11-18: qty 50

## 2020-11-18 NOTE — Progress Notes (Signed)
Blood sugar is 375, per Sliding scale orders, 15 units of Novolog given. Provided snack also to prevent sudden drop of blood sugar as known to happen with this patient. Will continue to monitor.

## 2020-11-18 NOTE — Consult Note (Signed)
Palliative Care  Consultation Reason: Pain Management/Goals of Care  Mr. Derek [name] is a 75 yo man with a histpry of pancreatic neuroendocrine cancer, prostate cancer s/p TURP, invasive squamous cell skin cancer, s/p resection, DM, Obesity, and chronic musculoskeletal/Low back pain admitted on 9/28 after an intentional overdose of hydromorphone (100 tablets/8mg ). Lew endorses his suicidal ideation and feels remorse.   His narrative includes acutely worsening nearly intolerable pain over the past few months and loss of function of his lower extremities-he reports his pain and weakness has gotten so bad that he can barely walk and that he was unable get on his motorcycle which he tells me was the only remaining joy in his life. He shares with me that his QOL has really declined since he had surgery for pancreatic cancer in 2017-partial pancreatectomy w/splenectomy (islet cell tumor, clean margins). When he was told he had pancreatic cancer, he reports that he took his last motorcycle trip to [place] and was gone for a month. He can no longer ride his bike even for short trips. He has had inconsistent cancer surveillance because his surgery was considered curative. His PCP ordered a PET scan in 11/2019 because he was having worsening low back and pelvic pain-there were borderline hypermetabolic nodes at that time but non-specific. His bladder wall was also noted to be abnormal.  Opioid Use Hx: Chronic pain since an accident and orthopedic injuries that started in young adulthood. He has been prescribed Hydromorphone for at least 2+ years per PDMP review with a consistent monthly refill and same provider. In 01/2020 his hydromorphone dose was doubled from #120 to #210 tablets being dispensed. He has allergies documented for oxycodone, morphine,diclofenac, gabapentin, hydrocodone with facial swelling reported.  Assessment and Recommendations:  Management of Opioid Withdrawal, Use of Ketamine for Opioid  Tolerance or Depression, Management of Intentional Overdose and Chronic Pain Management without a known contributing Progressive or Life-Limiting Diagnosis is not within the scope of palliative care practice.  Chronic Pain/ Acute on Chronic Pain/Intentional Overdose: His experience with pain has changed in terms of the quality, duration, severity and intensity of his pain over the past few months leading to his attempted suicide which he reports was because he could no longer take the pain he was experiencing.This could have a variety of etiologies: He could have developed extreme tolerance and resistance to hydromorphone-possibly even hyperalgesia since he had his dose doubled earlier this year and continued to have worsening pain and function. He could have onset of psychiatric or neurodegenerative disease or emotional/psychosocial stressors causing worsening pain  He could have medical cause for new and acutely worsening pain and lower extremity weakness ie malignancy w/spinal involvement.  2. Hypercalcemia, DVT, Hx Cancer, LE Weakness, Abnormal PET At this time we should complete a w/u for malignancy- he has known hx of several significant cancers, a recent DVT, and he has hypercalcemia at >11. He also has nodes that were hypermetabolic on his PET scan done in 11/2019 and an abnormal bladder. Need to r/o lower extremity weakness from cord compression or progression of nerve root impingement.  3. UTI/Abnormal Bladder on Prior Imaging He has gross hematuria and abnormal UA Micro.  He could also be experiencing some delirium or worsening pain and reduced coping associated with UTI/Pyuria- culture is pending but he has 50+WBC+Nitrite+ Clumps of WBC. I do not see where he is receiving treatment for this. Start Rocephin IV q24 empirically.  Orders: Ca 9-19 CEA tumor marker Urine Cytology CT Abdomen and Pelvis  CT L-Spine. Start Rocephin IV based on UA results from yesterday  Will await further  medical evaluation and results.   Currently he appears comfortable -his affect is appropriate, he carries on logical conversation and is able to provide specific details and history about his medical problems. He rates his pain at 6/10. His pupils are still very pinpoint. He is clearly suffering emotionally and spiritually. Consider Chaplain consult.  Time: 70 minutes Greater than 50%  of this time was spent counseling and coordinating care related to the above assessment and plan.  Lane Hacker, DO Palliative Medicine

## 2020-11-18 NOTE — Progress Notes (Addendum)
NAME:  Derek [name], MRN:  [id number], DOB:  Apr 12, 1945, LOS: 1 ADMISSION DATE:  11/17/2020, CONSULTATION DATE:  11/17/20 REFERRING MD:  Choitner- TRH, CHIEF COMPLAINT:  opiate OD  History of Present Illness:  Derek [name] is a 75 y/o gentleman with a history of chronic pain on dilaudid who presented with uncontrolled chronic pain and intentional overdose. He took >100 pills of dilaudid 8mg . His home dose is 8mg  up to 7 times per day as needed. He felt like his pain was too uncontrolled, but didn't feel better after his overdose. He presented to the ED with cool clammy skin and miosis. He was started on a narcan infusion in the ED with a goal of maintaining adequate respirations. Tylenol and salicylates negative. He developed nausea and vomiting in the ED this morning after eating breakfast. He has 1:1 sitter. 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 Factor V leiden mutation. BPH GERD OSA  Significant Hospital Events: Including procedures, antibiotic start and stop dates in addition to other pertinent events   9/28 admitted, started on narcan infusion  Interim History / Subjective:    Objective   Blood pressure 135/63, pulse 67, temperature 97.9 F (36.6 C), temperature source Oral, resp. rate 16, SpO2 95 %.        Intake/Output Summary (Last 24 hours) at 11/18/2020 0959 Last data filed at 11/18/2020 0915 Gross per 24 hour  Intake 583.45 ml  Output 750 ml  Net -166.55 ml   There were no vitals filed for this visit.  Examination: General: Chronically ill appearing elderly M reclined in bed NAD Psych: tearful. 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 Was fairly recently referred to a pain management group and did completed 2/3 ket infusions which seemed to be transiently helpful.  -interestingly pt pain no worse with narcan, highlighting the low utility of opioids as mainstay of his pain mgmgnt at this point  P -1:1 -Psych consult -- will most certainly need inpt psych -[place] consult for help with pain management  -Needs a multimodal approach for this chronic, non-cancer pain  -I am adding a low dose lyrica qHS and a 1x of 2g mag infusion -could consider 200mg  ket gtt (+versed) but I am not sure of the efficacy if not completed as a typical ket series.  -if pain were acutely unmanageable, could also consider lido gtt  IDDM with hyperglycemia S/p Pancreatectomy  P -SSI  N/v in setting of opioid overdose and precipitated withdrawal  P -off narcan gtt 9/29 -PRN antiemetic   GERD P -PPI  Hx DVT, Factor V Leiden  Chronic anticoagulation Chronic anemia Hematuria  P -eliquis  -compression stockings (VVS outpt note rec compression hose)  -CBC PRN    Best Practice (right click and "Reselect all SmartList Selections" daily)  Diet/type: Regular consistency (see orders) DVT prophylaxis: DOAC GI prophylaxis: PPI- PTA med Lines: N/A Foley:  N/A Code Status:  full code Last date of multidisciplinary goals of care discussion [ pending ]  Labs   CBC: Recent Labs  Lab 11/17/20 0401  WBC 10.3  NEUTROABS 5.6  HGB 11.7*  HCT 35.2*  MCV 91.7  PLT 364     Basic Metabolic Panel: Recent Labs  Lab 11/17/20 0401  NA 136  K 4.3  CL  109  CO2 19*  GLUCOSE 347*  BUN 29*  CREATININE 1.15  CALCIUM 11.2*    GFR: Estimated Creatinine Clearance: 72.1 mL/min (by C-G formula based on SCr of 1.15 mg/dL). Recent Labs  Lab 11/17/20 0401  WBC 10.3     Liver Function Tests: No results for input(s): AST, ALT, ALKPHOS, BILITOT, PROT, ALBUMIN in the last 168 hours. No results for input(s): LIPASE, AMYLASE in the last 168 hours. No results for input(s): AMMONIA in the last 168 hours.  ABG    Component Value Date/Time   PHART 7.319 (L) 02/25/2015 1427   PCO2ART 42.9 02/25/2015 1427   PO2ART 68.7 (L) 02/25/2015 1427   HCO3 21.7 02/25/2015 1427   TCO2 26 04/21/2019 0820   ACIDBASEDEF 4.0 (H) 02/25/2015 1427   O2SAT 91.4 02/25/2015 1427      Coagulation Profile: No results for input(s): INR, PROTIME in the last 168 hours.  Cardiac Enzymes: No results for input(s): CKTOTAL, CKMB, CKMBINDEX, TROPONINI in the last 168 hours.  HbA1C: Hgb A1c MFr Bld  Date/Time Value Ref Range Status  09/15/2016 02:30 PM 10.2 (H) 4.8 - 5.6 % Final    Comment:    (NOTE)         Pre-diabetes: 5.7 - 6.4         Diabetes: >6.4         Glycemic control for adults with diabetes: <7.0   11/13/2015 11:11 AM 8.8 (H) 4.8 - 5.6 % Final    Comment:    (NOTE)         Pre-diabetes: 5.7 - 6.4         Diabetes: >6.4         Glycemic control for adults with diabetes: <7.0     CBG: Recent Labs  Lab 11/17/20 1525 11/17/20 1929 11/17/20 2330 11/18/20 0350 11/18/20 0741  GLUCAP 197* 202* 295* 173* 151*      CCT: n/a  Eliseo Gum MSN, AGACNP-BC [place] for pager  11/18/2020, 10:00 AM

## 2020-11-18 NOTE — TOC Initial Note (Signed)
Transition of Care [facility]) - Initial/Assessment Note    Patient Details  Name: Derek [name] MRN: [id number] Date of Birth: 1945/09/02  Transition of Care [facility]) CM/SW Contact:    Leeroy Cha, RN Phone Number: 11/18/2020, 8:57 AM  Clinical Narrative:                 75 y/o gentleman with a history of chronic pain on dilaudid who presented with uncontrolled chronic pain and intentional overdose. He took >100 pills of dilaudid 8mg . His home dose is 8mg  up to 7 times per day as needed. He felt like his pain was too uncontrolled, but didn't feel better after his overdose. He presented to the ED with cool clammy skin and miosis. He was started on a narcan infusion in the ED with a goal of maintaining adequate respirations. Tylenol and salicylates negative. He developed nausea and vomiting in the ED this morning after eating breakfast. He has 1:1 sitter. 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 Factor V leiden mutation. BPH GERD OSA   Significant Hospital Events: Including procedures, antibiotic start and stop dates in addition to other pertinent events   9/28 admitted, started on narcan infusion TOC PLAN OF CARE: FOLLOWING FOR PROGRESSION  Expected Discharge Plan: Home/Self Care Barriers to Discharge: Continued Medical Work up   Patient Goals and CMS Choice Patient states their goals for this hospitalization and ongoing recovery are:: to go home      Expected Discharge Plan and Services Expected Discharge Plan: Home/Self Care   Discharge Planning Services: CM Consult   Living arrangements for the past 2 months: Single Family Home                                      Prior Living Arrangements/Services Living arrangements for  the past 2 months: Single Family Home Lives with:: Spouse Patient language and need for interpreter reviewed:: Yes Do you feel safe going back to the place where you live?: Yes      Need for Family Participation in Patient Care: Yes (Comment) Care giver support system in place?: Yes (comment)   Criminal Activity/Legal Involvement Pertinent to Current Situation/Hospitalization: No - Comment as needed  Activities of Daily Living Home Assistive Devices/Equipment: CBG Meter, Insulin Pump, Eyeglasses, Cane (specify quad or straight), Walker (specify type), Crutches, Dentures (specify type) (lower partial plate, single point cane, front wheeled walker) ADL Screening (condition at time of admission) Patient's cognitive ability adequate to safely complete daily activities?: Yes (patient very sleepy) Is the patient deaf or have difficulty hearing?: 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) Does the patient have difficulty seeing, even when wearing glasses/contacts?: No Does the patient have difficulty concentrating, remembering, or making decisions?: Yes Patient able to express need for assistance with ADLs?: Yes Does the patient have difficulty dressing or bathing?: Yes Independently performs ADLs?: No (secondary to lethargy and weakness) Communication: Independent Dressing (OT): Dependent Is this a change from baseline?: Change from baseline, expected to last >3 days Grooming: Dependent Is this a change from baseline?: Change from baseline, expected to last >3 days Feeding: Dependent Is this a change from baseline?: Change from baseline, expected to last >3 days Bathing: Dependent Is  this a change from baseline?: Change from baseline, expected to last >3 days Toileting: Dependent Is this a change from baseline?: Change from baseline, expected to last >3days In/Out Bed: Dependent Is this a change from baseline?: Change from baseline, expected to last >3 days Walks in Home: Dependent Is this a change from  baseline?: Change from baseline, expected to last >3 days Does the patient have difficulty walking or climbing stairs?: Yes (secondary to weakness, lethargy and chronic back pain) Weakness of Legs: Both Weakness of Arms/Hands: None  Permission Sought/Granted                  Emotional Assessment Appearance:: Appears stated age     Orientation: : Oriented to Self, Oriented to Place, Oriented to  Time, Oriented to Situation Alcohol / Substance Use: Alcohol Use, Other (comment) (poisitive for opitates that he is prescribed for chronic pain)    Admission diagnosis:  Suicide attempt ([place]) [T14.91XA] Intentional opiate overdose, initial encounter ([place]) [T40.602A] Overdose, intentional self-harm, initial encounter ([place]) [T50.902A] Patient Active Problem List   Diagnosis Date Noted   Overdose, intentional self-harm, initial encounter ([place]) 11/17/2020   Deep venous thrombosis ([place]) 11/16/2020   Enlarged prostate with urinary obstruction 04/21/2019   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   Foreign body of abdominal wall 09/04/2016   Ventral incisional hernia 11/12/2015   Incisional hernia 11/10/2015   Infected pancreatic pseudocyst 05/13/2015   Pleural effusion on left    Lactose intolerance in adult 04/04/2015   Protein-calorie malnutrition, moderate ([place]) 04/04/2015   Pleural effusion, left    Debility    Benign essential HTN    Leukocytosis    Thrombocytosis    Intra-abdominal abscess ([place])    Poorly controlled type 2 diabetes mellitus ([place])    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 [place] [address] Phone: [phone] Fax: [phone]     Social Determinants of Health (SDOH) Interventions    Readmission Risk Interventions No flowsheet data found.

## 2020-11-18 NOTE — Progress Notes (Signed)
Patient drinking oral contrast for STAT CT. Night meds also given.

## 2020-11-18 NOTE — Progress Notes (Signed)
[facility] Progress Note Patient Name: Derek [name] DOB: 07-09-1945 MRN: [id number]   Date of Service  11/18/2020  HPI/Events of Note  Patient c/o back pain and requests Tylenol.  eICU Interventions  Plan: Tylenol 650 mg PO Q 6 hours PRN pain or headache.      Intervention Category Major Interventions: Other:  Lysle Dingwall 11/18/2020, 4:43 AM

## 2020-11-18 NOTE — Consult Note (Signed)
[facility] New Psychiatric Evaluation   Service Date: November 18, 2020 LOS:  LOS: 1 day    Assessment  Derek [name] is a 75 y.o. male admitted medically for 11/17/2020  3:38 AM for an overdose on approximately 800 mg of dilaudid. He has no psychiatric diagnoses and has an extensive past medical history pertinent for chronic pain and recent DVT; see PMH below which I have reviewed. Psychiatry was consulted for suicide attempt by Derek [name] and Derek [name] (2 orders placed).    His current presentation of a serious suicide attempt requiring ICU stay and narcan infusion is most consistent with MDD/severe; unclear whether single untreated episode or recurrent. He has no medications and has not seen a psychiatrist.  He does meet criteria for MDD (anhedonia, sleep, energy most significant on exam). 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 On initial examination, patient stated that he regretted his attempt and is "glad" things worked out the way they did. He is embarrassedly about some of what we discussed and ask that I attempt to block this note (option not enabled for my account); have removed some sensitive information not necessary for clinical decision making. We discussed inpatient psychiatric hospitalization which he is considering. Did not discuss IVC. Please see plan below for detailed recommendations.   9/29: defer psych meds as pall care deliberates on ketamine. Still plan for inpt psych when medically stable  Diagnoses:  Active Hospital problems: Active Problems:   Overdose, intentional self-harm, initial encounter ([place])    Problems edited/added by me: No problems updated.  Plan  ## Safety and Observation Level:  - Based on my clinical evaluation, I estimate the patient to be at high risk of self harm in the current setting - At this time, we recommend a 1:1 sitter level of observation. This  decision is based on my review of the chart including patient's history and current presentation, interview of the patient, mental status examination, and consideration of suicide risk including evaluating suicidal ideation, plan, intent, suicidal or self-harm behaviors, risk factors, and protective factors. This judgment is based on our ability to directly address suicide risk, implement suicide prevention strategies and develop a safety plan while the patient is in the clinical setting. Please contact our team if there is a concern that risk level has changed.   -- pt is not threatening to leave AMA; please IVC if he threatens to leave. Planning on inpt psych hospitalization.   ## Medications:  -- none at this time  -- opioid withdrawal comfort measures per primary team; common measures include: loperamide for GI distress, hydroxyzine for anxiety,   -- consider initiating suboxone ; palliative medicine consult to drive pain mgmt. Would need frequent dosing (q4-6) during induction 2/2 chronic pain  ## Medical Decision Making Capacity:  Not formally assessed at this time  ## Further Work-up:  -- B12/TSH wnl   ## Disposition:  -- psychiatric hospitalization  ## Behavioral / Environmental:  -- delirium precautions  ##Legal Status -- currently voluntary   Thank you for this consult request. Recommendations have been communicated to the primary team.  We will contiue to follow at this time.   [place] A Doha Boling    NEW  Relevant Aspects of Hospital Course:  Admitted on 11/17/2020 for intentional OD on 800 mg dilaudid; required ICU admission and narcan infusion which is being tapered off. Has since tolerated narcan taper.   Patient Report:  Alert & oriented, seemed less confused than yesterday. Focused again on his wife/children visiting him, whether the team has gotten in touch with them. Reiterated ultimate plan for inpt psych hospitalization.  Denied overt SI/HI/AH/VH.    ROS:  Generally + for pain  Collateral information:  OK to call wife. Not OK to discuss conversation from 10 years ago.   Attempted to call 9/28 no response. Unable to call 9/29.  Family psych history: daughter has a lot of depression.  PMH, social, allergies, family, psych history: see original consult note. Reviewed/Updated as appropriate; pt has allergies listed to sertraline and duloxetine.    Current Medications:   Current Facility-Administered Medications:    acetaminophen (TYLENOL) tablet 650 mg, 650 mg, Oral, Q6H PRN, [name], MD, 650 mg at 11/18/20 0452   carvedilol (COREG) tablet 25 mg, 25 mg, Oral, QHS, [name], DO, 25 mg at 11/17/20 2100   Chlorhexidine Gluconate Cloth 2 % PADS 6 each, 6 each, Topical, Q0600, Chotiner, Yevonne Aline, MD   enoxaparin (LOVENOX) injection 114 mg, 1 mg/kg, Subcutaneous, BID, [name], DO, 114 mg at 11/17/20 2100   furosemide (LASIX) tablet 20 mg, 20 mg, Oral, Daily PRN, Derek [name] P, DO   glucagon (human recombinant) (GLUCAGEN) injection 1 mg, 1 mg, Intravenous, Once PRN, Derek [name] P, DO   insulin aspart (novoLOG) injection 0-15 Units, 0-15 Units, Subcutaneous, Q4H, [name], DO, 3 Units at 11/18/20 0800   insulin glargine-yfgn (SEMGLEE) injection 14 Units, 14 Units, Subcutaneous, Daily, Carlis Abbott, Laura P, DO   losartan (COZAAR) tablet 50 mg, 50 mg, Oral, QHS, [name], DO, 50 mg at 11/17/20 2100   magnesium oxide (MAG-OX) tablet 800 mg, 800 mg, Oral, QHS, Chotiner, Yevonne Aline, MD, 800 mg at 11/17/20 2100   MEDLINE mouth rinse, 15 mL, Mouth Rinse, BID, Chotiner, Yevonne Aline, MD   pantoprazole (PROTONIX) EC tablet 20 mg, 20 mg, Oral, Daily, Carlis Abbott, Laura P, DO   pravastatin (PRAVACHOL) tablet 20 mg, 20 mg, Oral, Once per day on Mon Thu, [name], Laura P, DO   promethazine (PHENERGAN) 6.25 mg in sodium chloride 0.9 % 50 mL IVPB, 6.25 mg, Intravenous, Q6H PRN, [name], DO, Stopped at 11/17/20 1007   senna  (SENOKOT) tablet 8.6 mg, 1 tablet, Oral, Daily, Derek [name] P, DO      Objective  Vital signs:  Temp:  [97.9 F (36.6 C)-99.1 F (37.3 C)] 97.9 F (36.6 C) (09/29 0800) Pulse Rate:  [56-102] 59 (09/29 0800) Resp:  [12-20] 12 (09/29 0800) BP: (118-148)/(55-110) 131/61 (09/29 0800) SpO2:  [94 %-99 %] 96 % (09/29 0800)   Physical Exam: Gen: alert, oriented, more attentive today Pulm: no increased WOB Skin: no rash, lesions, piloerection noted Neuro: CNII-CXII grossly intact   Mental Status Exam: Appearance: Lying in bed, ill appearing, hospital attire  Attitude:  Cooperative, less distractible  Behavior/Psychomotor: Normal speed and amount of gesturing  Speech/Language:  Tone, volume, spontaneity, prosody wnl  Mood: OK today  Affect: Full range, less labile  Thought process: Goal directed, some loosening of associations  Thought content:   Devoid of current suicidal or homicidal content, concerned varying on paranoid about this conversation's secrecy  Perceptual disturbances:  AH/VH not endorsed, not RIS  Attention: Was able to attend to conversation through multiple interuptions  Concentration: Some difficulty concentrating to conversation at hand  Orientation: Oriented to self, situation,   Memory: Recent/remote appeared to be intact  Fund of knowledge:  Not formally  assessed  Insight:   poor  Judgment:  Very poor  Impulse Control: Very poor     Data Reviewed: PHQ2 did not screen + in 11/21

## 2020-11-19 DIAGNOSIS — T50902A Poisoning by unspecified drugs, medicaments and biological substances, intentional self-harm, initial encounter: Secondary | ICD-10-CM | POA: Diagnosis not present

## 2020-11-19 DIAGNOSIS — G894 Chronic pain syndrome: Secondary | ICD-10-CM

## 2020-11-19 DIAGNOSIS — E119 Type 2 diabetes mellitus without complications: Secondary | ICD-10-CM

## 2020-11-19 DIAGNOSIS — Z794 Long term (current) use of insulin: Secondary | ICD-10-CM

## 2020-11-19 DIAGNOSIS — F322 Major depressive disorder, single episode, severe without psychotic features: Secondary | ICD-10-CM | POA: Diagnosis not present

## 2020-11-19 LAB — GLUCOSE, CAPILLARY
Glucose-Capillary: 206 mg/dL — ABNORMAL HIGH (ref 70–99)
Glucose-Capillary: 237 mg/dL — ABNORMAL HIGH (ref 70–99)
Glucose-Capillary: 227 mg/dL — ABNORMAL HIGH (ref 70–99)
Glucose-Capillary: 278 mg/dL — ABNORMAL HIGH (ref 70–99)
Glucose-Capillary: 278 mg/dL — ABNORMAL HIGH (ref 70–99)

## 2020-11-19 LAB — URINE CULTURE
Culture: NO GROWTH
Special Requests: NORMAL

## 2020-11-19 MED ORDER — MELATONIN 3 MG PO TABS
3.0000 mg | ORAL_TABLET | Freq: Every day | ORAL | Status: DC
  Administered 2020-11-19 – 2020-11-29 (×8): 3 mg via ORAL
  Filled 2020-11-19 (×9): qty 1

## 2020-11-19 MED ORDER — METHOCARBAMOL 500 MG PO TABS
500.0000 mg | ORAL_TABLET | Freq: Four times a day (QID) | ORAL | Status: AC | PRN
  Administered 2020-11-19 – 2020-11-21 (×2): 500 mg via ORAL
  Filled 2020-11-19 (×2): qty 1

## 2020-11-19 MED ORDER — CARVEDILOL 6.25 MG PO TABS
6.2500 mg | ORAL_TABLET | Freq: Every day | ORAL | Status: DC
  Administered 2020-11-19 – 2020-11-29 (×11): 6.25 mg via ORAL
  Filled 2020-11-19 (×11): qty 1

## 2020-11-19 MED ORDER — LOSARTAN POTASSIUM 50 MG PO TABS
25.0000 mg | ORAL_TABLET | Freq: Every day | ORAL | Status: DC
  Administered 2020-11-20 – 2020-11-29 (×10): 25 mg via ORAL
  Filled 2020-11-19 (×10): qty 1

## 2020-11-19 NOTE — Consult Note (Signed)
Derek [name]   Service Date: November 19, 2020 LOS:  LOS: 2 days   Assessment  Derek [name] is a 75 y.o. male admitted medically for 11/17/2020  3:38 AM for an overdose on approximately 800 mg of dilaudid. He has no psychiatric diagnoses and has an extensive past medical history pertinent for chronic pain and recent DVT; see PMH below which I have reviewed. Psychiatry was consulted for suicide attempt by Derek [name] and Derek [name] (2 orders placed).    His current presentation of a serious suicide attempt requiring ICU stay and narcan infusion is most consistent with MDD/severe; unclear whether single untreated episode or recurrent. He has no medications and has not seen a psychiatrist.  He does meet criteria for MDD (anhedonia, sleep, energy most significant on exam). 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 On initial examination, patient stated that he regretted his attempt and is "glad" things worked out the way they did. He is embarrassedly about some of what we discussed and ask that I attempt to block his  notes (option not enabled for my account); have removed some sensitive information not necessary for clinical decision making. We discussed inpatient psychiatric hospitalization which he is considering. Did not discuss IVC. Please see plan below for detailed recommendations.   9/29: defer psych meds as pall care deliberates on ketamine. Still plan for inpt psych when medically stable  9/30: defer psych meds. Confused, starting to slip into delirium. Melatonin +delirium precautions. Some agitation noted.  Diagnoses:  Active Hospital problems: Active Problems:   Overdose, intentional self-harm, initial encounter (Derek [name])   Intentional opiate overdose (Derek [name])    Problems edited/added by me: No problems updated.  Plan  ## Safety and Observation Level:  - Based on my clinical  [name], I estimate the patient to be at high risk of self harm in the current setting - At this time, we recommend a 1:1 sitter level of observation. This decision is based on my review of the chart including patient's history and current presentation, interview of the patient, mental status examination, and consideration of suicide risk including evaluating suicidal ideation, plan, intent, suicidal or self-harm behaviors, risk factors, and protective factors. This judgment is based on our ability to directly address suicide risk, implement suicide prevention strategies and develop a safety plan while the patient is in the clinical setting. Please contact our team if there is a concern that risk level has changed.   -- pt is not threatening to leave AMA; please IVC if he threatens to leave. Planning on inpt psych hospitalization.   ## Medications:  -- none at this time  -- opioid withdrawal comfort measures per primary team; common measures include: loperamide for GI distress, hydroxyzine for anxiety,   -- consider initiating suboxone ; palliative medicine consult to drive pain mgmt. Would need frequent dosing (q4-6) during induction 2/2 chronic pain  ## Medical Decision Making Capacity:  Not formally assessed at this time  ## Further Work-up:  -- B12/TSH wnl   ## Disposition:  -- psychiatric hospitalization  ## Behavioral / Environmental:  -- delirium precautions  ##Legal Status -- currently voluntary   Thank you for this consult request. Recommendations have been communicated to the primary team.  We will contiue to follow at this time.   Derek [name]    NEW  Relevant Aspects of Hospital Course:  Admitted on 11/17/2020 for intentional OD on  800 mg dilaudid; required ICU admission and narcan infusion which is being tapered off. Has since tolerated narcan taper.   I personally spent 15 minutes on the unit in direct patient care. The direct patient care time  included face-to-face time with the patient, reviewing the patient's chart, communicating with other professionals, and coordinating care. Greater than 50% of this time was spent in counseling or coordinating care with the patient regarding goals of hospitalization, psycho-education, and discharge planning needs.    Patient Report:  Drowsy & inattentive, seemed more confused than yesterday. Fell asleep 2-3 times during [name]. Focused again on his wife/children visiting him, whether the team has gotten in touch with them - let him know I had spoken to wife. Reiterated ultimate plan for inpt psych hospitalization.  When I asked about hallucinations, he stated that he was having them. I asked him for more detail and he said "don't you know what hallucinaitons are!". I asked again for more detail - he is waking up, thinking the TV is a few feet to the left or right, and looking around a bit before he tracks the TV.   Denied overt SI/HI/AH/VH - do not believe episode above c/w hallucination (more illusion).    Briefly spoke to CNA - had been pleasant/polite with her but did snap at his wife when she visited.  Collateral Wife has noticed a lot of deterioration in his ability to cope over the last 6 months, in his activity levels, in a lot of things. She is concerned that he has had a lot of medical issues lately. He has been intermittently threatening suicide throughout their married life but did not threaten before this. She found out because he woke her up. She is not sure why he woke her up. He also has pistols (a lot of pistols which have since been removed). He told her he did not use a pistol because "if I used a pistol there is no turning back". Generally confirms ~6 months of    Wife thinks he took more than 100 pills - closer to 140 mg.    Confirms pt has no relationship with his son. His daughter is "just like him" - the argue quite a bit.   ROS:  Generally + for pain  Collateral  information:  OK to call wife. Not OK to discuss conversation from 10 years ago.   Attempted to call 9/28 no response. Unable to call 9/29.  Family psych history: daughter has a lot of depression.  PMH, social, allergies, family, psych history: see original consult note. Reviewed/Updated as appropriate; pt has allergies listed to sertraline and duloxetine.    Current Medications:   Current Facility-Administered Medications:    acetaminophen (TYLENOL) tablet 650 mg, 650 mg, Oral, Q6H PRN, [name], MD, 650 mg at 11/19/20 1554   apixaban (ELIQUIS) tablet 5 mg, 5 mg, Oral, BID, [name], DO, 5 mg at 11/19/20 4268   calcium carbonate (TUMS - dosed in mg elemental calcium) chewable tablet 200 mg of elemental calcium, 1 tablet, Oral, BID PRN, Derek [name] P, DO, 200 mg of elemental calcium at 11/18/20 1617   carvedilol (COREG) tablet 6.25 mg, 6.25 mg, Oral, QHS, [name], MD   cefTRIAXone (ROCEPHIN) 1 g in sodium chloride 0.9 % 100 mL IVPB, 1 g, Intravenous, Q24H, [name], MD, Stopped at 11/18/20 2320   Chlorhexidine Gluconate Cloth 2 % PADS 6 each, 6 each, Topical, Q0600, [name], DO, 6 each at 11/19/20  1550   furosemide (LASIX) tablet 20 mg, 20 mg, Oral, Daily PRN, Derek [name] P, DO   glucagon (human recombinant) (GLUCAGEN) injection 1 mg, 1 mg, Intravenous, Once PRN, Derek [name] P, DO   hydrOXYzine (ATARAX/VISTARIL) tablet 10 mg, 10 mg, Oral, TID PRN, Derek [name] P, DO   insulin aspart (novoLOG) injection 0-15 Units, 0-15 Units, Subcutaneous, Q4H, [name], DO, 8 Units at 11/19/20 1553   [START ON 11/20/2020] losartan (COZAAR) tablet 25 mg, 25 mg, Oral, Daily, [name], MD   magnesium oxide (MAG-OX) tablet 800 mg, 800 mg, Oral, QHS, Chotiner, Yevonne Aline, MD, 800 mg at 11/18/20 2154   MEDLINE mouth rinse, 15 mL, Mouth Rinse, BID, Derek [name] P, DO, 15 mL at 11/19/20 0934   pantoprazole (PROTONIX) EC tablet 20 mg, 20 mg, Oral, Daily, [name], DO, 20 mg at 11/19/20 0932   pravastatin (PRAVACHOL) tablet 20 mg, 20 mg, Oral, Once per day on Mon Thu, [name], Laura P, DO, 20 mg at 11/18/20 1000   pregabalin (LYRICA) capsule 25 mg, 25 mg, Oral, QHS, Bowser, Laurel Dimmer, NP, 25 mg at 11/18/20 2154   promethazine (PHENERGAN) 6.25 mg in sodium chloride 0.9 % 50 mL IVPB, 6.25 mg, Intravenous, Q6H PRN, [name], DO, Stopped at 11/17/20 1007   senna (SENOKOT) tablet 8.6 mg, 1 tablet, Oral, Daily, Derek [name] P, DO, 8.6 mg at 11/19/20 0933   Theraworx Relief LIQD 1 application, 1 application, Apply externally, Daily PRN, Bowser, Laurel Dimmer, NP      Objective  Vital signs:  Temp:  [97.7 F (36.5 C)-98.1 F (36.7 C)] 97.7 F (36.5 C) (09/30 1600) Pulse Rate:  [63-92] 67 (09/30 1600) Resp:  [11-20] 15 (09/30 1600) BP: (77-158)/(39-99) 145/67 (09/30 1600) SpO2:  [95 %-100 %] 99 % (09/30 1600)   Physical Exam: Gen: alert, inattentive Pulm: no increased WOB Skin: no rash, lesions, piloerection noted Neuro: CNII-CXII grossly intact   Mental Status Exam: Appearance: Lying in bed, ill appearing, hospital attire  Attitude:  Distractable, occasionally snaps  Behavior/Psychomotor: Normal speed and amount of gesturing  Speech/Language:  Tone, volume, spontaneity, prosody wnl  Mood: OK today  Affect: Full range, angers easily  Thought process: Goal directed, some loosening of associations  Thought content:   Devoid of current suicidal or homicidal content, less concerned/paranoid about privacy  Perceptual disturbances:  See above - some illusions/perceptual disturbances, does not seem like he is having AH/VH.  Attention: Was able to attend to conversation through multiple interuptions  Concentration: Some difficulty concentrating to conversation at hand  Orientation: Oriented to self, situation,   Memory: Recent/remote appeared to be intact  Fund of knowledge:  Not formally assessed  Insight:   poor  Judgment:  Very poor   Impulse Control: Very poor     Data Reviewed: PHQ2 did not screen + in 11/21

## 2020-11-19 NOTE — Progress Notes (Addendum)
[facility] PROGRESS NOTE   Derek [name] ZSW:[id number] DOB: 1945-07-01 DOA: 11/17/2020  PCP: [name], MD  Brief History/Interval Summary: 75 y/o gentleman with a history of chronic pain involving his lower back shoulder knees and abdomen.  Also has a history of pancreatic cancer status post pancreatectomy splenectomy, history of DVT on apixaban, history of factor V Leyden mutation.  He is also seen by pain management specialist.  He is on dilaudid at home.  He presented with uncontrolled chronic pain and intentional overdose. He took >100 pills of dilaudid 8mg . His home dose is 8mg  up to 7 times per day as needed. He felt like his pain was too uncontrolled, but didn't feel better after his overdose. He presented to the ED with cool clammy skin and miosis. He was started on a narcan infusion in the ED with a goal of maintaining adequate respirations.  He was stabilized and then transferred to the hospitalist service.  Consultants: Psychiatry.  Palliative care.  Procedures: None yet    Subjective/Interval History: Patient mentions that he has chronic back pain but over the past few months it has worsened.  He also has chronic lower extremity pain which seems to radiate from his back and that has also worsened in the past few months.  He has been finding it more more difficult to ambulate.  He used to ride his motorcycle previously which he is not able to do so now.  Denies any chest pain shortness of breath.  No nausea vomiting.   Assessment/Plan:  Suicide attempt with intentional overdose of Dilaudid-depression Patient was in the intensive care unit on a Narcan infusion.  He has stabilized over the last 24 hours.  Currently off of narcotics.  Psychiatry has been consulted.  They do feel that patient may need inpatient psychiatric admission.  Patient was also seen by palliative care due to his chronic pain issues. Landscape architect.  Chronic pain issues He has pain  chronically in his back shoulder both his legs.  This has acutely worsened in the last few months.  Seen by palliative care to help with pain management.  Patient apparently is followed by pain management in the outpatient setting.  He was on Dilaudid at home prior to admission.  Since due to intentional overdose of narcotics are on hold currently.  He was started on Lyrica.  Acute on chronic back pain Patient with longstanding history of degenerative disc disease.  CT of the lumbar spine was done yesterday which shows evidence for significant spinal stenosis in the L4-L5 and L5-S1 regions.  There is also evidence for nerve impingement.  Have requested neurosurgery to review his imaging studies and perhaps reviewed the office notes since the patient mentions that he has been seen by neurosurgery previously.  We will wait for their input.  No focal neurological deficits noted on examination.  He is able to lift his legs.  No red flag symptoms noted. Dr. Annette Stable with neurosurgery reviewed his films.  No indication for any intervention currently.  They will be happy to see him in their office in the next few weeks.  Apparently patient has not seen any of the neurosurgery provider in their office since 2014.  Urinary tract infection UA was noted to be abnormal.  On ceftriaxone.  Insulin-dependent diabetes mellitus Home medication list reviewed.  Looks like he is on [place] and NovoLog via pump.  But previous notes mention that he does not take his long-acting insulin at home and  is apparently refusing here in the hospital.  Currently just on SSI.  Diabetes coordinator to see.  No recent HbA1c in our system.  We will order.  History of DVT/factor V Leyden mutation Patient is chronically anticoagulated with Eliquis which is being continued.  Chronic atrial fibrillation On carvedilol which is being continued.  Anticoagulated with apixaban.  Essential hypertension Monitor blood pressures closely.  Nausea  and vomiting This was in the setting of opioid overdose.  Seems to have improved.  Symptomatic treatment.  GERD Continue PPI  Nephrolithiasis Incidentally noted on CT scan.  Nonobstructing.  Obesity Estimated body mass index is 33.91 kg/m as calculated from the following:   Height as of 11/16/20: 6' (1.829 m).   Weight as of 11/16/20: 113.4 kg.    DVT Prophylaxis: Apixaban Code Status: Full code Family Communication: Discussed with patient.  No family at bedside Disposition Plan: It appears that patient will need inpatient psychiatric admission when medically stable.  Status is: Inpatient  Remains inpatient appropriate because:Ongoing active pain requiring inpatient pain management, Inpatient level of care appropriate due to severity of illness, and suicide attempt  Dispo: The patient is from: Home              Anticipated d/c is to:  To be determined              Patient currently is not medically stable to d/c.   Difficult to place patient No      Medications: Scheduled:  apixaban  5 mg Oral BID   carvedilol  25 mg Oral QHS   Chlorhexidine Gluconate Cloth  6 each Topical Q0600   insulin aspart  0-15 Units Subcutaneous Q4H   losartan  50 mg Oral QHS   magnesium oxide  800 mg Oral QHS   mouth rinse  15 mL Mouth Rinse BID   pantoprazole  20 mg Oral Daily   pravastatin  20 mg Oral Once per day on Mon Thu   pregabalin  25 mg Oral QHS   senna  1 tablet Oral Daily   Continuous:  cefTRIAXone (ROCEPHIN)  IV Stopped (11/18/20 2320)   promethazine (PHENERGAN) injection (IM or IVPB) Stopped (11/17/20 1007)   OZH:YQMVHQIONGEXB, calcium carbonate, furosemide, glucagon (human recombinant), hydrOXYzine, promethazine (PHENERGAN) injection (IM or IVPB), Theraworx Relief  Antibiotics: Anti-infectives (From admission, onward)    Start     Dose/Rate Route Frequency Ordered Stop   11/18/20 2300  cefTRIAXone (ROCEPHIN) 1 g in sodium chloride 0.9 % 100 mL IVPB        1 g 200 mL/hr  over 30 Minutes Intravenous Every 24 hours 11/18/20 2152         Objective:  Vital Signs  Vitals:   11/19/20 0820 11/19/20 0900 11/19/20 1000 11/19/20 1010  BP: (!) 106/42 (!) 118/49  (!) 105/39  Pulse: 64 78 82 69  Resp: 20 19 14 18   Temp:      TempSrc:      SpO2: 97% 97% 100% 99%    Intake/Output Summary (Last 24 hours) at 11/19/2020 1035 Last data filed at 11/19/2020 1010 Gross per 24 hour  Intake 1522.13 ml  Output 2175 ml  Net -652.87 ml   There were no vitals filed for this visit.  General appearance: Awake alert.  In no distress Resp: Clear to auscultation bilaterally.  Normal effort Cardio: S1-S2 is normal regular.  No S3-S4.  No rubs murmurs or bruit GI: Abdomen is soft.  Nontender nondistended.  Bowel sounds are present  normal.  No masses organomegaly Extremities: Mild edema bilateral lower extremities.  Able to lift both legs off the bed though physical deconditioning is noted. Neurologic: Alert and oriented x3.  No focal neurological deficits.    Lab Results:  Data Reviewed: I have personally reviewed following labs and imaging studies  CBC: Recent Labs  Lab 11/17/20 0401  WBC 10.3  NEUTROABS 5.6  HGB 11.7*  HCT 35.2*  MCV 91.7  PLT 161    Basic Metabolic Panel: Recent Labs  Lab 11/17/20 0401  NA 136  K 4.3  CL 109  CO2 19*  GLUCOSE 347*  BUN 29*  CREATININE 1.15  CALCIUM 11.2*    GFR: Estimated Creatinine Clearance: 72.1 mL/min (by C-G formula based on SCr of 1.15 mg/dL).   CBG: Recent Labs  Lab 11/18/20 1614 11/18/20 1938 11/18/20 2338 11/19/20 0321 11/19/20 0728  GLUCAP 206* 375* 220* 206* 237*   Thyroid Function Tests: Recent Labs    11/17/20 1828  TSH 1.045    Anemia Panel: Recent Labs    11/17/20 1828  VITAMINB12 207    Recent Results (from the past 240 hour(s))  Resp Panel by RT-PCR (Flu A&B, Covid) Nasopharyngeal Swab     Status: None   Collection Time: 11/17/20  4:59 AM   Specimen: Nasopharyngeal Swab;  Nasopharyngeal(NP) swabs in vial transport medium  Result Value Ref Range Status   SARS Coronavirus 2 by RT PCR NEGATIVE NEGATIVE Final    Comment: (NOTE) SARS-CoV-2 target nucleic acids are NOT DETECTED.  The SARS-CoV-2 RNA is generally detectable in upper respiratory specimens during the acute phase of infection. The lowest concentration of SARS-CoV-2 viral copies this assay can detect is 138 copies/mL. A negative result does not preclude SARS-Cov-2 infection and should not be used as the sole basis for treatment or other patient management decisions. A negative result may occur with  improper specimen collection/handling, submission of specimen other than nasopharyngeal swab, presence of viral mutation(s) within the areas targeted by this assay, and inadequate number of viral copies(<138 copies/mL). A negative result must be combined with clinical observations, patient history, and epidemiological information. The expected result is Negative.  Fact Sheet for Patients:  EntrepreneurPulse.com.au  Fact Sheet for Healthcare Providers:  IncredibleEmployment.be  This test is no t yet approved or cleared by the [place] FDA and  has been authorized for detection and/or diagnosis of SARS-CoV-2 by FDA under an Emergency Use Authorization (EUA). This EUA will remain  in effect (meaning this test can be used) for the duration of the COVID-19 declaration under Section 564(b)(1) of the Act, 21 U.S.C.section 360bbb-3(b)(1), unless the authorization is terminated  or revoked sooner.       Influenza A by PCR NEGATIVE NEGATIVE Final   Influenza B by PCR NEGATIVE NEGATIVE Final    Comment: (NOTE) The Xpert Xpress SARS-CoV-2/FLU/RSV plus assay is intended as an aid in the diagnosis of influenza from Nasopharyngeal swab specimens and should not be used as a sole basis for treatment. Nasal washings and aspirates are unacceptable for Xpert Xpress  SARS-CoV-2/FLU/RSV testing.  Fact Sheet for Patients: EntrepreneurPulse.com.au  Fact Sheet for Healthcare Providers: IncredibleEmployment.be  This test is not yet approved or cleared by the [place] FDA and has been authorized for detection and/or diagnosis of SARS-CoV-2 by FDA under an Emergency Use Authorization (EUA). This EUA will remain in effect (meaning this test can be used) for the duration of the COVID-19 declaration under Section 564(b)(1) of the Act, 21 U.S.C. section  360bbb-3(b)(1), unless the authorization is terminated or revoked.  Performed at [facility], [place] [address] [place], [place] [address]   Urine Culture     Status: None   Collection Time: 11/17/20  9:14 AM   Specimen: Urine, Clean Catch  Result Value Ref Range Status   Specimen Description   Final    URINE, CLEAN CATCH Performed at [facility], [place] [address] [place], [place] [address]    Special Requests   Final    Normal Performed at [facility], [place] [address] [place], [place] [address]    Culture   Final    NO GROWTH Performed at [facility], [place] [address], [place], [place] [address]    Report Status 11/19/2020 FINAL  Final  MRSA Next Gen by PCR, Nasal     Status: None   Collection Time: 11/17/20  9:27 AM   Specimen: Nasal Mucosa; Nasal Swab  Result Value Ref Range Status   MRSA by PCR Next Gen NOT DETECTED NOT DETECTED Final    Comment: (NOTE) The GeneXpert MRSA Assay (FDA approved for NASAL specimens only), is one component of a comprehensive MRSA colonization surveillance program. It is not intended to diagnose MRSA infection nor to guide or monitor treatment for MRSA infections. Test performance is not FDA approved in patients less than 32 years old. Performed at [facility], [place] [address] [place], [place] [address]       Radiology Studies: CT  LUMBAR SPINE W CONTRAST  Result Date: 11/19/2020 CLINICAL DATA:  Pancreatic cancer status post partial pancreatectomy and splenectomy. Prostate cancer EXAM: CT LUMBAR SPINE WITH CONTRAST TECHNIQUE: Multidetector CT imaging of the lumbar spine was performed with intravenous contrast administration. CONTRAST:  77mL OMNIPAQUE IOHEXOL 350 MG/ML SOLN COMPARISON:  None. FINDINGS: Segmentation: Transitional lumbar anatomy with partial lumbarization of S1. Alignment: Normal lumbar lordosis.  No listhesis. Vertebrae: No acute fracture or focal pathologic process. Paraspinal and other soft tissues: Atherosclerotic calcification noted within the visualized aortoiliac vasculature. The paraspinal soft tissues are otherwise unremarkable. Disc levels: T12-L1: Disc space preserved. No significant neuroforaminal narrowing. No significant facet arthrosis. No canal stenosis. L1-L2: Disc space preserved. No significant facet arthrosis. No significant neuroforaminal narrowing or canal stenosis. L2-L3: Disc space preserved. No significant facet arthrosis. No significant canal stenosis or neuroforaminal narrowing. L3-L4: Mild intervertebral disc space narrowing with endplate remodeling and vacuum disc phenomena in keeping with mild to moderate degenerative disc disease. No significant facet arthrosis. No significant neuroforaminal narrowing. Mild posterior broad-based disc bulge in combination with mild hypertrophy of the lamina propria results in mild central canal stenosis. There is narrowing of the lateral recess bilaterally with possible impingement of the crossing L4 nerve roots. L4-5: Mild intervertebral disc space narrowing with slight endplate remodeling and vacuum disc phenomena in keeping with changes of moderate degenerative disc disease. Mild right facet arthrosis. Synovial hypertrophy mildly encroaches upon the inferior neural foramen without impingement of the exiting L4 nerve roots. Moderate broad-based disc bulge in  combination with synovial hypertrophy and hypertrophy of the lamina propria results in marked central canal stenosis, best appreciated on axial image # 125/10 with probable impingement of the crossing L5 nerve roots. L5-S1: Vacuum disc phenomena seen in keeping with mild to moderate degenerative disc disease. Disc height has been preserved. Mild bilateral facet arthrosis. Facet hypertrophy results in left mild neuroforaminal narrowing without impingement of the exiting L5 nerve root. Broad-based disc bulge in combination with hypertrophy  of the lamina propria bilaterally results in moderate central canal stenosis, best seen on axial image # 139/10 with impingement of the crossing S1 nerve roots. IMPRESSION: No acute fracture or listhesis. No osseous metastatic disease identified. 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 At each of these levels, there is probable impingement of the crossing L5 and S1 nerve roots. Electronically Signed   By: Fidela Salisbury M.D.   On: 11/19/2020 01:19   CT ABDOMEN PELVIS W CONTRAST  Result Date: 11/19/2020 CLINICAL DATA:  Pancreatic cancer, surveillance. Increasing chronic back and abdominal pain. EXAM: CT ABDOMEN AND PELVIS WITH CONTRAST TECHNIQUE: Multidetector CT imaging of the abdomen and pelvis was performed using the standard protocol following bolus administration of intravenous contrast. CONTRAST:  69mL OMNIPAQUE IOHEXOL 350 MG/ML SOLN COMPARISON:  08/16/2020 FINDINGS: Lower chest: The visualized lung bases are clear. Visualized heart and pericardium are unremarkable. Moderate hiatal hernia. Hepatobiliary: No focal liver abnormality is seen. No gallstones, gallbladder wall thickening, or biliary dilatation. Pancreas: Surgical changes of distal pancreatectomy are identified. Normal appearance of the residual proximal pancreas. Spleen: Splenectomy has  been performed. Adrenals/Urinary Tract: The adrenal glands are unremarkable. Punctate left lower pole nephrolithiasis again noted. The kidneys are otherwise normal. The bladder is mildly distended. 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 There is superimposed asymmetric posterior bladder wall thickening and perivesicular inflammatory stranding involving the posterior bladder wall. This may be infectious or inflammatory in nature. Stomach/Bowel: Intra-abdominal stomach, small bowel, and large bowel are unremarkable. Appendix normal. No free intraperitoneal gas or fluid Vascular/Lymphatic: Moderate aortoiliac atherosclerotic calcification. No aortic aneurysm. No pathologic adenopathy within the abdomen and pelvis. Reproductive: Changes of UroLift procedure are identified. Probable surgical changes of trans urethral prostate resection. The prostate is of normal size. Seminal vesicles are unremarkable. Other: Small right and moderate left fat containing inguinal hernias are present. Rectum unremarkable. Musculoskeletal: No acute bone abnormality. No lytic or blastic bone lesions. Degenerative changes are seen within the lumbar spine. IMPRESSION: Surgical changes of distal pancreatectomy and splenectomy. No evidence of recurrent or residual disease within the abdomen and pelvis. Moderate hiatal hernia. Stable minimal left nonobstructing nephrolithiasis. No hydronephrosis. No urolithiasis. Mild bladder distension possibly related to voluntary retention or some degree of bladder outlet obstruction. Superimposed asymmetric bladder wall thickening and perivesicular inflammatory stranding, possibly infectious or inflammatory in nature. Correlation with urinalysis and urine culture may be helpful. Aortic Atherosclerosis (ICD10-I70.0). Electronically Signed   By: Fidela Salisbury M.D.   On: 11/19/2020 01:00       LOS: 2 days   [facility] Pager on  [email or web address]  11/19/2020, 10:35 AM

## 2020-11-19 NOTE — Evaluation (Signed)
Physical Therapy Evaluation Patient Details Name: Derek [name] MRN: [id number] DOB: [birth year] Today's Date: 11/19/2020  History of Present Illness  75 y/o gentleman with a history of chronic pain involving his lower back shoulder knees and abdomen.  Also has a history of pancreatic cancer status post pancreatectomy splenectomy, history of DVT on apixaban, history of factor V Leyden mutation.  He is also seen by pain management specialist.  He is on dilaudid at home.  He presented with uncontrolled chronic pain and intentional overdose  Clinical Impression  Patient very talkative about riding his motorcycle and  wanting to get back on it. Patient ambulated a few feet in room with  RW, should be able to progress  ambulation. Patient independent at baseline and resides with wife. Pt admitted with above diagnosis.  Pt currently with functional limitations due to the deficits listed below (see PT Problem List). Pt will benefit from skilled PT to increase their independence and safety with mobility to allow discharge to the venue listed below.        Recommendations for follow up therapy are one component of a multi-disciplinary discharge planning process, led by the attending physician.  Recommendations may be updated based on patient status, additional functional criteria and insurance authorization.  Follow Up Recommendations Home health PT    Equipment Recommendations  None recommended by PT    Recommendations for Other Services       Precautions / Restrictions Precautions Precautions: Fall Precaution Comments: SA      Mobility  Bed Mobility Overal bed mobility: Needs Assistance Bed Mobility: Supine to Sit     Supine to sit: Supervision;HOB elevated          Transfers Overall transfer level: Needs assistance Equipment used: Standard walker Transfers: Sit to/from Stand Sit to Stand: Supervision         General transfer comment: no  support  Ambulation/Gait Ambulation/Gait assistance: Min guard Gait Distance (Feet): 6 Feet (forward and backward)   Gait Pattern/deviations: Step-to pattern;Step-through pattern        Stairs            Wheelchair Mobility    Modified Rankin (Stroke Patients Only)       Balance Overall balance assessment: Needs assistance Sitting-balance support: No upper extremity supported;Feet supported Sitting balance-Leahy Scale: Good     Standing balance support: During functional activity;Bilateral upper extremity supported Standing balance-Leahy Scale: Fair                               Pertinent Vitals/Pain Pain Assessment: Faces Faces Pain Scale: Hurts a little bit Pain Location: spot on buttock Pain Descriptors / Indicators: Discomfort Pain Intervention(s): Monitored during session (cream applied)    Home Living Family/patient expects to be discharged to:: Private residence Living Arrangements: Spouse/significant other Available Help at Discharge: Family;Available 24 hours/day Type of Home: House Home Access: Stairs to enter Entrance Stairs-Rails: Psychiatric nurse of Steps: 4 Home Layout: Able to live on main level with bedroom/bathroom Home Equipment: Walker - 2 wheels;Cane - single point      Prior Function Level of Independence: Independent               Hand Dominance   Dominant Hand: Right    Extremity/Trunk Assessment   Upper Extremity Assessment Upper Extremity Assessment: Overall WFL for tasks assessed    Lower Extremity Assessment Lower Extremity Assessment: Overall WFL for tasks assessed  Cervical / Trunk Assessment Cervical / Trunk Assessment: Normal  Communication   Communication: No difficulties  Cognition Arousal/Alertness: Awake/alert Behavior During Therapy: WFL for tasks assessed/performed Overall Cognitive Status: Within Functional Limits for tasks assessed                                  General Comments: Stated" I did something stupid."      General Comments      Exercises     Assessment/Plan    PT Assessment Patient needs continued PT services  PT Problem List Decreased strength;Decreased mobility;Decreased activity tolerance;Decreased balance;Decreased knowledge of use of DME       PT Treatment Interventions DME instruction;Therapeutic activities;Gait training;Therapeutic exercise;Patient/family education;Functional mobility training    PT Goals (Current goals can be found in the Care Plan section)  Acute Rehab PT Goals Patient Stated Goal: t  mototrcycle and ride  to [facility] PT Goal Formulation: With patient Time For Goal Achievement: 12/03/20 Potential to Achieve Goals: Good    Frequency Min 3X/week   Barriers to discharge        Co-evaluation               AM-PAC PT "6 Clicks" Mobility  Outcome Measure Help needed turning from your back to your side while in a flat bed without using bedrails?: A Little Help needed moving from lying on your back to sitting on the side of a flat bed without using bedrails?: A Little Help needed moving to and from a bed to a chair (including a wheelchair)?: A Little Help needed standing up from a chair using your arms (e.g., wheelchair or bedside chair)?: A Little Help needed to walk in hospital room?: A Little Help needed climbing 3-5 steps with a railing? : A Lot 6 Click Score: 17    End of Session   Activity Tolerance: Patient tolerated treatment well Patient left: in chair;with call bell/phone within reach;with chair alarm set;with nursing/sitter in room Nurse Communication: Mobility status PT Visit Diagnosis: Unsteadiness on feet (R26.81)    Time: 5697-9480 PT Time Calculation (min) (ACUTE ONLY): 25 min   Charges:   PT Evaluation $PT Eval Low Complexity: 1 Low PT Treatments $Gait Training: 8-22 mins        [name] PT Acute Rehabilitation Services Pager  [phone] Office [phone]   [name] 11/19/2020, 4:20 PM

## 2020-11-19 NOTE — Progress Notes (Signed)
Report called to Alinda Sierras RN. All questions answered at this time. Pt was transported upstairs in the wheelchair by RN and NT on tele. Paper chart and belongings were transported with the patient. 5E will continue to care for patient.

## 2020-11-19 NOTE — Progress Notes (Signed)
Inpatient Diabetes Program Recommendations  AACE/ADA: New Consensus Statement on Inpatient Glycemic Control (2015)  Target Ranges:  Prepandial:   less than 140 mg/dL      Peak postprandial:   less than 180 mg/dL (1-2 hours)      Critically ill patients:  140 - 180 mg/dL   Lab Results  Component Value Date   GLUCAP 227 (H) 11/19/2020   HGBA1C 10.2 (H) 09/15/2016    Review of Glycemic Control  Diabetes history: DM1 Outpatient Diabetes medications: Novolog 5-30 units TID before meals, Tresiba 14 units QAM (not taking d/t hypoglycemia) Current orders for Inpatient glycemic control: Novolog 0-15 units Q4H  HgbA1C ordered. 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 Pt states he does not take [place] at home because his blood sugar would drop low in the middle of the night. Only takes Novolog with meals - has s/s from Endo. Pt states he checks his blood sugars before each meal and "it's usually 300-400s." We discussed importance of controlling blood sugars to feel better and reduce risk of complications. Pt adamant about not taking any long-acting insulin. Asked him to f/u with his Endo and he said he would.   Will follow glucose trends.  Thank you. Lorenda Peck, RD, LDN, CDE Inpatient Diabetes Coordinator [phone]

## 2020-11-20 LAB — COMPREHENSIVE METABOLIC PANEL
ALT: 22 U/L (ref 0–44)
AST: 18 U/L (ref 15–41)
Albumin: 3.2 g/dL — ABNORMAL LOW (ref 3.5–5.0)
Alkaline Phosphatase: 65 U/L (ref 38–126)
Anion gap: 7 (ref 5–15)
BUN: 25 mg/dL — ABNORMAL HIGH (ref 8–23)
CO2: 25 mmol/L (ref 22–32)
Calcium: 11.2 mg/dL — ABNORMAL HIGH (ref 8.9–10.3)
Chloride: 104 mmol/L (ref 98–111)
Creatinine, Ser: 0.87 mg/dL (ref 0.61–1.24)
GFR, Estimated: >60 mL/min (ref >60)
Glucose, Bld: 182 mg/dL — ABNORMAL HIGH (ref 70–99)
Potassium: 4.5 mmol/L (ref 3.5–5.1)
Sodium: 136 mmol/L (ref 135–145)
Total Bilirubin: 0.4 mg/dL (ref 0.3–1.2)
Total Protein: 6.6 g/dL (ref 6.5–8.1)

## 2020-11-20 LAB — CBC
HCT: 34.6 % — ABNORMAL LOW (ref 39.0–52.0)
Hemoglobin: 11.6 g/dL — ABNORMAL LOW (ref 13.0–17.0)
MCH: 30.4 pg (ref 26.0–34.0)
MCHC: 33.5 g/dL (ref 30.0–36.0)
MCV: 90.6 fL (ref 80.0–100.0)
Platelets: 402 10*3/uL — ABNORMAL HIGH (ref 150–400)
RBC: 3.82 MIL/uL — ABNORMAL LOW (ref 4.22–5.81)
RDW: 13.4 % (ref 11.5–15.5)
WBC: 7.6 10*3/uL (ref 4.0–10.5)
nRBC: 0 % (ref 0.0–0.2)

## 2020-11-20 LAB — GLUCOSE, CAPILLARY
Glucose-Capillary: 156 mg/dL — ABNORMAL HIGH (ref 70–99)
Glucose-Capillary: 162 mg/dL — ABNORMAL HIGH (ref 70–99)
Glucose-Capillary: 237 mg/dL — ABNORMAL HIGH (ref 70–99)
Glucose-Capillary: 265 mg/dL — ABNORMAL HIGH (ref 70–99)
Glucose-Capillary: 374 mg/dL — ABNORMAL HIGH (ref 70–99)

## 2020-11-20 LAB — HEMOGLOBIN A1C
Hgb A1c MFr Bld: 10 % — ABNORMAL HIGH (ref 4.8–5.6)
Mean Plasma Glucose: 240.3 mg/dL

## 2020-11-20 MED ORDER — METHADONE HCL 5 MG PO TABS
5.0000 mg | ORAL_TABLET | Freq: Two times a day (BID) | ORAL | Status: DC
  Administered 2020-11-21 (×2): 5 mg via ORAL
  Filled 2020-11-20 (×3): qty 1

## 2020-11-20 MED ORDER — POLYETHYLENE GLYCOL 3350 17 G PO PACK
17.0000 g | PACK | Freq: Two times a day (BID) | ORAL | Status: DC
  Administered 2020-11-20 – 2020-11-21 (×3): 17 g via ORAL
  Filled 2020-11-20 (×4): qty 1

## 2020-11-20 MED ORDER — SENNOSIDES-DOCUSATE SODIUM 8.6-50 MG PO TABS
2.0000 | ORAL_TABLET | Freq: Two times a day (BID) | ORAL | Status: DC
  Administered 2020-11-20 – 2020-11-29 (×6): 2 via ORAL
  Filled 2020-11-20 (×12): qty 2

## 2020-11-20 NOTE — Evaluation (Signed)
Occupational Therapy Evaluation Patient Details Name: Derek [name] MRN: [id number] DOB: 1945/05/07 Today's Date: 11/20/2020   History of Present Illness patient is a 75 y/o gentleman with a history of chronic pain involving his lower back shoulder knees and abdomen.  Also has a history of pancreatic cancer status post pancreatectomy splenectomy, history of DVT on apixaban, history of factor V Leyden mutation.  He is also seen by pain management specialist.  He is on dilaudid at home.  He presented with uncontrolled chronic pain and intentional overdose   Clinical Impression   Patient is a 75 year old male who was admitted for above. Patient was noted to need in guard for safety with ADL task and min A for LB dressing tasks. Patient plans to transition to inpatient psych at time of d/c. Patient would continue to benefit from skilled OT services at this time while admitted to address noted deficits in order to improve overall safety and independence in ADLs.        Recommendations for follow up therapy are one component of a multi-disciplinary discharge planning process, led by the attending physician.  Recommendations may be updated based on patient status, additional functional criteria and insurance authorization.   Follow Up Recommendations  No OT follow up    Equipment Recommendations  None recommended by OT    Recommendations for Other Services       Precautions / Restrictions Precautions Precautions: Fall Precaution Comments: SA Restrictions Weight Bearing Restrictions: No      Mobility Bed Mobility Overal bed mobility: Needs Assistance Bed Mobility: Supine to Sit     Supine to sit: Supervision;HOB elevated     General bed mobility comments: with increased time    Transfers Overall transfer level: Needs assistance Equipment used: Rolling walker (2 wheeled) Transfers: Sit to/from Stand Sit to Stand: Supervision         General transfer comment: patient was  able to transfer from edge of bed to commode in bathroom and back    Balance Overall balance assessment: Needs assistance Sitting-balance support: No upper extremity supported;Feet supported Sitting balance-Leahy Scale: Good     Standing balance support: During functional activity;Bilateral upper extremity supported Standing balance-Leahy Scale: Fair                             ADL either performed or assessed with clinical judgement   ADL Overall ADL's : Needs assistance/impaired Eating/Feeding: Set up;Sitting   Grooming: Wash/dry face;Oral care;Sitting;Set up   Upper Body Bathing: Sitting;Set up   Lower Body Bathing: Sit to/from stand;Min guard   Upper Body Dressing : Sitting;Set up   Lower Body Dressing: Sitting/lateral leans;Minimal assistance   Toilet Transfer: Minimal assistance;RW;Ambulation;Regular Toilet patient completed functional mobility to bathroom and back with increased time. Patient noted to have increased urinary output at this time. Nursing aware.    Toileting- Water quality scientist and Hygiene: Min guard;Sit to/from stand patient was noted to have moments of frustration during session but patient was able to be redirected to task at hand.        Functional mobility during ADLs: Min guard;Rolling walker General ADL Comments: patient reported having increased pain in back but was able to participate in daily tasks with reduced assistance noted on this date.     Vision Patient Visual Report: No change from baseline Vision Assessment?: No apparent visual deficits     Perception     Praxis  Pertinent Vitals/Pain Pain Assessment: Faces Faces Pain Scale: Hurts a little bit Pain Location: spot on buttock Pain Descriptors / Indicators: Discomfort Pain Intervention(s): Monitored during session     Hand Dominance Right   Extremity/Trunk Assessment Upper Extremity Assessment Upper Extremity Assessment: Overall WFL for tasks assessed    Lower Extremity Assessment Lower Extremity Assessment: Defer to PT evaluation   Cervical / Trunk Assessment Cervical / Trunk Assessment: Normal   Communication Communication Communication: No difficulties   Cognition Arousal/Alertness: Awake/alert Behavior During Therapy: WFL for tasks assessed/performed Overall Cognitive Status: Within Functional Limits for tasks assessed                                     General Comments       Exercises     Shoulder Instructions      Home Living Family/patient expects to be discharged to:: Private residence Living Arrangements: Spouse/significant other Available Help at Discharge: Family;Available 24 hours/day Type of Home: House Home Access: Stairs to enter [employer] of Steps: 4 Entrance Stairs-Rails: Right;Left Home Layout: Able to live on main level with bedroom/bathroom     Bathroom Shower/Tub: Tub/shower unit   Bathroom Toilet: Handicapped height     Home Equipment: Environmental consultant - 2 wheels;Cane - single point          Prior Functioning/Environment Level of Independence: Independent                 OT Problem List: Impaired balance (sitting and/or standing);Decreased activity tolerance;Decreased safety awareness      OT Treatment/Interventions: Self-care/ADL training;DME and/or AE instruction;Energy conservation;Therapeutic activities;Balance training;Patient/family education    OT Goals(Current goals can be found in the care plan section) Acute Rehab OT Goals Patient Stated Goal: to ride motorcycle again OT Goal Formulation: With patient Time For Goal Achievement: 12/04/20 Potential to Achieve Goals: Good  OT Frequency: Min 2X/week   Barriers to D/C:            Co-evaluation              AM-PAC OT "6 Clicks" Daily Activity     Outcome Measure Help from another person eating meals?: None Help from another person taking care of personal grooming?: None Help from another  person toileting, which includes using toliet, bedpan, or urinal?: A Little Help from another person bathing (including washing, rinsing, drying)?: A Little Help from another person to put on and taking off regular upper body clothing?: None Help from another person to put on and taking off regular lower body clothing?: A Little 6 Click Score: 21   End of Session Equipment Utilized During Treatment: Gait belt;Rolling walker Nurse Communication: Mobility status  Activity Tolerance: Patient tolerated treatment well Patient left: in chair;with call bell/phone within reach;with nursing/sitter in room  OT Visit Diagnosis: Unsteadiness on feet (R26.81);Muscle weakness (generalized) (M62.81)                Time: 0350-0938 OT Time Calculation (min): 37 min Charges:  OT General Charges $OT Visit: 1 Visit OT Evaluation $OT Eval Low Complexity: 1 Low OT Treatments $Self Care/Home Management : 8-22 mins  Jackelyn Poling OTR/L, MS Acute Rehabilitation Department Office# [phone] Pager# [phone]   [place] 11/20/2020, 1:39 PM

## 2020-11-20 NOTE — Progress Notes (Signed)
Attempted to call the patients daughter in order to get theraworx medication for the patient, daughter answered the phone and went told this is the fathers nurse from [facility], pt's daughter noted to have hung up.

## 2020-11-20 NOTE — Progress Notes (Signed)
Patient noted to be refusing vital signs, as well as CBG. MD notified.

## 2020-11-20 NOTE — Progress Notes (Signed)
Went into patients room and attempted to give patient his medications and get his vital signs. Patient states "Don't give me nothing, I won't take it. Just let me die." Patient also states "I refuse to have any visitors, they won't let me die".

## 2020-11-20 NOTE — Progress Notes (Addendum)
[facility] PROGRESS NOTE   AUBURN HERT VPX:[id number] DOB: Dec 12, 1945 DOA: 11/17/2020  PCP: [name], MD  Brief History/Interval Summary: 75 y/o gentleman with a history of chronic pain involving his lower back shoulder knees and abdomen.  Also has a history of pancreatic cancer status post pancreatectomy splenectomy, history of DVT on apixaban, history of factor V Leyden mutation.  He is also seen by pain management specialist.  He is on dilaudid at home.  He presented with uncontrolled chronic pain and intentional overdose. He took >100 pills of dilaudid 8mg . His home dose is 8mg  up to 7 times per day as needed. He felt like his pain was too uncontrolled, but didn't feel better after his overdose. He presented to the ED with cool clammy skin and miosis. He was started on a narcan infusion in the ED with a goal of maintaining adequate respirations.  He was stabilized and then transferred to the hospitalist service.  Consultants: Psychiatry.  Palliative care.  Procedures: None yet    Subjective/Interval History: Patient continues to have pain in his back and his legs.  These are his chronic symptoms.  Denies any chest pain,  Shortness of breath.    Assessment/Plan:  Suicide attempt with intentional overdose of Dilaudid-depression Patient was in the intensive care unit on a Narcan infusion.  He has stabilized over the last 24 hours.  Currently off of narcotics.   Psychiatry was consulted.  Patient will need inpatient psychiatric care.  Seems to be medically stable at this time.  The pain medication issues need to be sorted out.  See below.  Landscape architect.    Chronic pain syndrome He has pain chronically in his back shoulder both his legs.  This has acutely worsened in the last few months.  Seen by palliative care to help with pain management.  Patient apparently is followed by pain management in the outpatient setting.  He was on Dilaudid at home prior to admission.   Since due to intentional overdose of narcotics are on hold currently.  He was started on Lyrica. Discussed with palliative care.  They will address his pain medication regimen today.  Acute on chronic back pain Patient with longstanding history of degenerative disc disease.  CT of the lumbar spine was done yesterday which shows evidence for significant spinal stenosis in the L4-L5 and L5-S1 regions.  There is also evidence for nerve impingement.  Images were reviewed by Dr. Annette Stable with neurosurgery.  No indication for any intervention currently.  They will be happy to see him in their office in the next few weeks.  Apparently patient has not seen any of the neurosurgery provider in their office since 2014.  No focal neurological deficits noted.  He is able to move his legs better today compared to 2 days ago. Was seen by physical therapy.  He ambulated about 6 feet in the room with a walker.  Home health has been recommended. Discussed with Dr. Hilma Favors with palliative as well as with Dr. Darleene Cleaver with psychiatry.  We will proceed with prescribing methadone for now.  Leave him off of the hydromorphone.  Constipation Aggressive bowel regimen prescribed.  Likely due to his chronic narcotic use.  This is a chronic issue for him.  TSH was normal.  Urinary tract infection UA was noted to be abnormal.  On ceftriaxone.  Plan for a 3-day course.  Urine cultures were negative.  Insulin-dependent diabetes mellitus Home medication list reviewed.  Looks like he is  on [place] and NovoLog via pump.  But previous notes mention that he does not take his long-acting insulin at home and is apparently refusing here in the hospital.   CBGs noted to be occasionally high.  He is just on SSI.   Patient was seen by the diabetes coordinator yesterday.  Looks like they had a long discussion and patient adamantly continues to refuse taking long-acting insulin due to his previous bad experience with low blood glucose levels in  the middle of the night.  Despite education patient declines.  Continue just SSI for now.   HbA1c 10.0.    History of DVT/factor V Leyden mutation Patient is chronically anticoagulated with Eliquis which is being continued.  Chronic atrial fibrillation On carvedilol which is being continued.  Anticoagulated with apixaban.  Essential hypertension Monitor blood pressures closely.  Nausea and vomiting This was in the setting of opioid overdose.  Seems to have improved.  Symptomatic treatment.  Rating his diet.  GERD Continue PPI  Nephrolithiasis Incidentally noted on CT scan.  Nonobstructing.  Obesity Estimated body mass index is 33.91 kg/m as calculated from the following:   Height as of 11/16/20: 6' (1.829 m).   Weight as of 11/16/20: 113.4 kg.    DVT Prophylaxis: Apixaban Code Status: Full code Family Communication: Discussed with patient.  No family at bedside Disposition Plan: It appears that patient will need inpatient psychiatric admission when medically stable.  Will need home health for physical therapy when he returns home.  We will consult social worker to initiate bed search for inpatient psychiatric care.  Status is: Inpatient  Remains inpatient appropriate because:Ongoing active pain requiring inpatient pain management, Inpatient level of care appropriate due to severity of illness, and suicide attempt  Dispo: The patient is from: Home              Anticipated d/c is to:  To be determined              Patient currently is not medically stable to d/c.   Difficult to place patient No      Medications: Scheduled:  apixaban  5 mg Oral BID   carvedilol  6.25 mg Oral QHS   Chlorhexidine Gluconate Cloth  6 each Topical Q0600   insulin aspart  0-15 Units Subcutaneous Q4H   losartan  25 mg Oral Daily   magnesium oxide  800 mg Oral QHS   mouth rinse  15 mL Mouth Rinse BID   melatonin  3 mg Oral q1800   pantoprazole  20 mg Oral Daily   polyethylene glycol  17 g  Oral BID   pravastatin  20 mg Oral Once per day on Mon Thu   pregabalin  25 mg Oral QHS   senna-docusate  2 tablet Oral BID   Continuous:  cefTRIAXone (ROCEPHIN)  IV 1 g (11/19/20 2350)   promethazine (PHENERGAN) injection (IM or IVPB) Stopped (11/17/20 1007)   GLO:VFIEPPIRJJOAC, calcium carbonate, furosemide, glucagon (human recombinant), hydrOXYzine, methocarbamol, promethazine (PHENERGAN) injection (IM or IVPB), Theraworx Relief  Antibiotics: Anti-infectives (From admission, onward)    Start     Dose/Rate Route Frequency Ordered Stop   11/18/20 2300  cefTRIAXone (ROCEPHIN) 1 g in sodium chloride 0.9 % 100 mL IVPB        1 g 200 mL/hr over 30 Minutes Intravenous Every 24 hours 11/18/20 2152 11/21/20 2259       Objective:  Vital Signs  Vitals:   11/19/20 1739 11/19/20 1800 11/19/20 1849 11/19/20 2130  BP: (!) 150/54 116/60 (!) 144/73 132/68  Pulse: 82 72 76 72  Resp: 19 13 17    Temp:   97.8 F (36.6 C) 97.8 F (36.6 C)  TempSrc:   Oral Oral  SpO2: 97% 97% 98% 96%    Intake/Output Summary (Last 24 hours) at 11/20/2020 1131 Last data filed at 11/20/2020 0656 Gross per 24 hour  Intake 240 ml  Output 3675 ml  Net -3435 ml    There were no vitals filed for this visit.  General appearance: Awake alert.  In no distress Resp: Clear to auscultation bilaterally.  Normal effort Cardio: S1-S2 is normal regular.  No S3-S4.  No rubs murmurs or bruit GI: Abdomen is soft.  Nontender nondistended.  Bowel sounds are present normal.  No masses organomegaly Extremities: No edema.  Improved mobility of the lower extremities noted today. Neurologic: Alert and oriented x3.  No focal neurological deficits.     Lab Results:  Data Reviewed: I have personally reviewed following labs and imaging studies  CBC: Recent Labs  Lab 11/17/20 0401 11/20/20 0538  WBC 10.3 7.6  NEUTROABS 5.6  --   HGB 11.7* 11.6*  HCT 35.2* 34.6*  MCV 91.7 90.6  PLT 364 402*     Basic Metabolic  Panel: Recent Labs  Lab 11/17/20 0401 11/20/20 0538  NA 136 136  K 4.3 4.5  CL 109 104  CO2 19* 25  GLUCOSE 347* 182*  BUN 29* 25*  CREATININE 1.15 0.87  CALCIUM 11.2* 11.2*     GFR: Estimated Creatinine Clearance: 95.4 mL/min (by C-G formula based on SCr of 0.87 mg/dL).   CBG: Recent Labs  Lab 11/19/20 1549 11/19/20 2127 11/20/20 0058 11/20/20 0431 11/20/20 0742  GLUCAP 278* 278* 265* 162* 156*    Thyroid Function Tests: Recent Labs    11/17/20 1828  TSH 1.045     Anemia Panel: Recent Labs    11/17/20 1828  VITAMINB12 207     Recent Results (from the past 240 hour(s))  Resp Panel by RT-PCR (Flu A&B, Covid) Nasopharyngeal Swab     Status: None   Collection Time: 11/17/20  4:59 AM   Specimen: Nasopharyngeal Swab; Nasopharyngeal(NP) swabs in vial transport medium  Result Value Ref Range Status   SARS Coronavirus 2 by RT PCR NEGATIVE NEGATIVE Final    Comment: (NOTE) SARS-CoV-2 target nucleic acids are NOT DETECTED.  The SARS-CoV-2 RNA is generally detectable in upper respiratory specimens during the acute phase of infection. The lowest concentration of SARS-CoV-2 viral copies this assay can detect is 138 copies/mL. A negative result does not preclude SARS-Cov-2 infection and should not be used as the sole basis for treatment or other patient management decisions. A negative result may occur with  improper specimen collection/handling, submission of specimen other than nasopharyngeal swab, presence of viral mutation(s) within the areas targeted by this assay, and inadequate number of viral copies(<138 copies/mL). A negative result must be combined with clinical observations, patient history, and epidemiological information. The expected result is Negative.  Fact Sheet for Patients:  EntrepreneurPulse.com.au  Fact Sheet for Healthcare Providers:  IncredibleEmployment.be  This test is no t yet approved or cleared  by the [place] FDA and  has been authorized for detection and/or diagnosis of SARS-CoV-2 by FDA under an Emergency Use Authorization (EUA). This EUA will remain  in effect (meaning this test can be used) for the duration of the COVID-19 declaration under Section 564(b)(1) of the Act, 21 U.S.C.section 360bbb-3(b)(1), unless the authorization  is terminated  or revoked sooner.       Influenza A by PCR NEGATIVE NEGATIVE Final   Influenza B by PCR NEGATIVE NEGATIVE Final    Comment: (NOTE) The Xpert Xpress SARS-CoV-2/FLU/RSV plus assay is intended as an aid in the diagnosis of influenza from Nasopharyngeal swab specimens and should not be used as a sole basis for treatment. Nasal washings and aspirates are unacceptable for Xpert Xpress SARS-CoV-2/FLU/RSV testing.  Fact Sheet for Patients: EntrepreneurPulse.com.au  Fact Sheet for Healthcare Providers: IncredibleEmployment.be  This test is not yet approved or cleared by the [place] FDA and has been authorized for detection and/or diagnosis of SARS-CoV-2 by FDA under an Emergency Use Authorization (EUA). This EUA will remain in effect (meaning this test can be used) for the duration of the COVID-19 declaration under Section 564(b)(1) of the Act, 21 U.S.C. section 360bbb-3(b)(1), unless the authorization is terminated or revoked.  Performed at [facility], [place] [address], [place], [place] [address]   Urine Culture     Status: None   Collection Time: 11/17/20  9:14 AM   Specimen: Urine, Clean Catch  Result Value Ref Range Status   Specimen Description   Final    URINE, CLEAN CATCH Performed at [facility], [place] [address], [place], [place] [address]    Special Requests   Final    Normal Performed at [facility], [place] [address], [place], [place] [address]    Culture   Final    NO GROWTH Performed at [facility],  [place] [address] [place], [place] [address]    Report Status 11/19/2020 FINAL  Final  MRSA Next Gen by PCR, Nasal     Status: None   Collection Time: 11/17/20  9:27 AM   Specimen: Nasal Mucosa; Nasal Swab  Result Value Ref Range Status   MRSA by PCR Next Gen NOT DETECTED NOT DETECTED Final    Comment: (NOTE) The GeneXpert MRSA Assay (FDA approved for NASAL specimens only), is one component of a comprehensive MRSA colonization surveillance program. It is not intended to diagnose MRSA infection nor to guide or monitor treatment for MRSA infections. Test performance is not FDA approved in patients less than 47 years old. Performed at [facility], [place] [address], [place], [place] [address]        Radiology Studies: CT LUMBAR SPINE W CONTRAST  Result Date: 11/19/2020 CLINICAL DATA:  Pancreatic cancer status post partial pancreatectomy and splenectomy. Prostate cancer EXAM: CT LUMBAR SPINE WITH CONTRAST TECHNIQUE: Multidetector CT imaging of the lumbar spine was performed with intravenous contrast administration. CONTRAST:  75mL OMNIPAQUE IOHEXOL 350 MG/ML SOLN COMPARISON:  None. FINDINGS: Segmentation: Transitional lumbar anatomy with partial lumbarization of S1. Alignment: Normal lumbar lordosis.  No listhesis. Vertebrae: No acute fracture or focal pathologic process. Paraspinal and other soft tissues: Atherosclerotic calcification noted within the visualized aortoiliac vasculature. The paraspinal soft tissues are otherwise unremarkable. Disc levels: T12-L1: Disc space preserved. No significant neuroforaminal narrowing. No significant facet arthrosis. No canal stenosis. L1-L2: Disc space preserved. No significant facet arthrosis. No significant neuroforaminal narrowing or canal stenosis. L2-L3: Disc space preserved. No significant facet arthrosis. No significant canal stenosis or neuroforaminal narrowing. L3-L4: Mild intervertebral disc space narrowing with endplate remodeling and  vacuum disc phenomena in keeping with mild to moderate degenerative disc disease. No significant facet arthrosis. No significant neuroforaminal narrowing. Mild posterior broad-based disc bulge in combination with mild hypertrophy of the lamina propria results  in mild central canal stenosis. There is narrowing of the lateral recess bilaterally with possible impingement of the crossing L4 nerve roots. L4-5: Mild intervertebral disc space narrowing with slight endplate remodeling and vacuum disc phenomena in keeping with changes of moderate degenerative disc disease. Mild right facet arthrosis. Synovial hypertrophy mildly encroaches upon the inferior neural foramen without impingement of the exiting L4 nerve roots. Moderate broad-based disc bulge in combination with synovial hypertrophy and hypertrophy of the lamina propria results in marked central canal stenosis, best appreciated on axial image # 125/10 with probable impingement of the crossing L5 nerve roots. L5-S1: Vacuum disc phenomena seen in keeping with mild to moderate degenerative disc disease. Disc height has been preserved. Mild bilateral facet arthrosis. Facet hypertrophy results in left mild neuroforaminal narrowing without impingement of the exiting L5 nerve root. Broad-based disc bulge in combination with hypertrophy of the lamina propria bilaterally results in moderate central canal stenosis, best seen on axial image # 139/10 with impingement of the crossing S1 nerve roots. IMPRESSION: No acute fracture or listhesis. No osseous metastatic disease identified. 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 At each of these levels, there is probable impingement of the crossing L5 and S1 nerve roots. Electronically Signed   By: Fidela Salisbury M.D.   On: 11/19/2020 01:19   CT ABDOMEN PELVIS W CONTRAST  Result Date: 11/19/2020 CLINICAL  DATA:  Pancreatic cancer, surveillance. Increasing chronic back and abdominal pain. EXAM: CT ABDOMEN AND PELVIS WITH CONTRAST TECHNIQUE: Multidetector CT imaging of the abdomen and pelvis was performed using the standard protocol following bolus administration of intravenous contrast. CONTRAST:  45mL OMNIPAQUE IOHEXOL 350 MG/ML SOLN COMPARISON:  08/16/2020 FINDINGS: Lower chest: The visualized lung bases are clear. Visualized heart and pericardium are unremarkable. Moderate hiatal hernia. Hepatobiliary: No focal liver abnormality is seen. No gallstones, gallbladder wall thickening, or biliary dilatation. Pancreas: Surgical changes of distal pancreatectomy are identified. Normal appearance of the residual proximal pancreas. 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 Adrenals/Urinary Tract: The adrenal glands are unremarkable. Punctate left lower pole nephrolithiasis again noted. The kidneys are otherwise normal. The bladder is mildly distended. 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 There is superimposed asymmetric posterior bladder wall thickening and perivesicular inflammatory stranding involving the posterior bladder wall. This may be infectious or inflammatory in nature. Stomach/Bowel: Intra-abdominal stomach, small bowel, and large bowel are unremarkable. Appendix normal. No free intraperitoneal gas or fluid Vascular/Lymphatic: Moderate aortoiliac atherosclerotic calcification. No aortic aneurysm. No pathologic adenopathy within the abdomen and pelvis. Reproductive: Changes of UroLift procedure are identified. Probable surgical changes of trans urethral prostate resection. The prostate is of normal size. Seminal vesicles are unremarkable. Other: Small right and moderate left fat containing inguinal hernias are present. Rectum unremarkable. Musculoskeletal: No acute bone abnormality. No lytic or blastic bone lesions. Degenerative changes are seen within the lumbar spine.  IMPRESSION: Surgical changes of distal pancreatectomy and splenectomy. No evidence of recurrent or residual disease within the abdomen and pelvis. Moderate hiatal hernia. Stable minimal left nonobstructing nephrolithiasis. No hydronephrosis. No urolithiasis. Mild bladder distension possibly related to voluntary retention or some degree of bladder outlet obstruction. Superimposed asymmetric bladder wall thickening and perivesicular inflammatory stranding, possibly infectious or inflammatory in nature. Correlation with urinalysis and urine culture may be helpful. Aortic Atherosclerosis (ICD10-I70.0). Electronically Signed   By: Fidela Salisbury M.D.   On: 11/19/2020 01:00  LOS: 3 days   Katya Rolston [employer] on [email or web address]  11/20/2020, 11:31 AM

## 2020-11-20 NOTE — Significant Event (Signed)
Called by RN: Pt attempting to leave AMA.  Review of chart: 75 yo M in hospital after serious suicide attempt by OD on dilaudid.  Required ICU stay and narcan infusion.  Psychiatry evaluated pt, feels he needs IP psych treatment (see their note for details).  Psych note (9/30) clearly requests IVC if he threatens to leave.  Pt continues to make suicidal statements throughout the afternoon today (see LPN notes for details).  IVC paperwork being faxed.

## 2020-11-21 LAB — GLUCOSE, CAPILLARY
Glucose-Capillary: 196 mg/dL — ABNORMAL HIGH (ref 70–99)
Glucose-Capillary: 233 mg/dL — ABNORMAL HIGH (ref 70–99)
Glucose-Capillary: 360 mg/dL — ABNORMAL HIGH (ref 70–99)
Glucose-Capillary: 355 mg/dL — ABNORMAL HIGH (ref 70–99)
Glucose-Capillary: 292 mg/dL — ABNORMAL HIGH (ref 70–99)

## 2020-11-21 LAB — CEA: CEA: 1.8 ng/mL (ref 0.0–4.7)

## 2020-11-21 LAB — PARATHYROID HORMONE, INTACT (NO CA): PTH: 16 pg/mL (ref 15–65)

## 2020-11-21 LAB — CANCER ANTIGEN 19-9: CA 19-9: 10 U/mL (ref 0–35)

## 2020-11-21 MED ORDER — HALOPERIDOL LACTATE 5 MG/ML IJ SOLN
2.0000 mg | Freq: Once | INTRAMUSCULAR | Status: AC
  Administered 2020-11-21: 2 mg via INTRAVENOUS
  Filled 2020-11-21: qty 1

## 2020-11-21 NOTE — Progress Notes (Signed)
Palliative Care Progress Note  Discussed case with Dr. [place] Pink including findings from CT scans and additional lab work. No evidence of metastatic disease, but significant spinal stenosis. Unclear findings in bladder with gross hematuria and questionable stone. Otherwise no known acute medical issues.  Without a life limiting diagnosis or malignant pain, his pain and opioid use disorder management fall outside the scope of palliative care practice. However I am making recommendations in the absence of an acute pain or addiction management service.   Avoid use of hydromorphone-he likely had hyperalgesia. Consider Cymbalta as anti-depressant with neuropathic pain benefits. Start Methadone for severe spinal stenosis pain 5mg  BID-TID to start- titrate for pain control (optimal drug for spinal stenosis related pain) May or may not be candidate for Suboxone for Opioid Use disorder-his unstable depression is primary management need. He isn't complaining of intractable or intolerable pain and walked with PT today. Add 3 lidoderm patches to his lower back and sacrum. Heat and Ice therapy after activity May be a candidate for interventional pain modalities. Needs Geropsych Management. Neurosurgery and Urology Follow up after stabilization. Reviewed notes and his challenges. He has pancreatic insufficiency and is at risk for passive attempts by using too much or not using insulin at all-could use a continuous CBG monitor to alert him to overnight lows if that is a concern.  Derek Hacker, DO Palliative Medicine

## 2020-11-21 NOTE — Progress Notes (Signed)
Pt. Refused scheduled IV Rocephin and midnight CBG. On- call NP made aware.

## 2020-11-21 NOTE — Progress Notes (Signed)
Patient's daughter Freida Busman says she removed all of the guns in his house.  She would like to have a meeting with doctors and case management/social worker, to set up the next step of his care.

## 2020-11-21 NOTE — Progress Notes (Signed)
[facility] PROGRESS NOTE   Derek [name] JEH:[id number] DOB: 02/18/1946 DOA: 11/17/2020  PCP: [name], MD  Brief History/Interval Summary: 75 y/o gentleman with a history of chronic pain involving his lower back shoulder knees and abdomen.  Also has a history of pancreatic cancer status post pancreatectomy splenectomy, history of DVT on apixaban, history of factor V Leyden mutation.  He is also seen by pain management specialist.  He is on dilaudid at home.  He presented with uncontrolled chronic pain and intentional overdose. He took >100 pills of dilaudid 8mg . His home dose is 8mg  up to 7 times per day as needed. He felt like his pain was too uncontrolled, but didn't feel better after his overdose. He presented to the ED with cool clammy skin and miosis. He was started on a narcan infusion in the ED with a goal of maintaining adequate respirations.  He was stabilized and then transferred to the hospitalist service.  Consultants: Psychiatry.  Palliative care.  Procedures: None yet   Subjective/Interval History: Patient mentioned that he continues to have pain in his back and legs as before.  However has been able to get up and move around without difficulties.  Denies any shortness of breath or chest pain.  He is adamant that he did not make any comments yesterday that suggested that he wanted to take his own life.    Assessment/Plan:  Suicide attempt with intentional overdose of Dilaudid-depression Patient was in the intensive care unit on a Narcan infusion.  He has stabilized over the last 24 hours.  Currently off of narcotics.   Psychiatry was consulted.  Patient will need inpatient psychiatric care.   Medically he is stable to be transferred to psychiatric facility.  Social worker has been consulted to assist.  Remains at high risk for self-harm.  Had to be involuntarily committed 10/1 as patient was threatening to leave. Patient has been refusing some of his  medications and CBG checks at times.  Further management per psychiatry.  Chronic pain syndrome He has pain chronically in his back shoulder both his legs.  This has acutely worsened in the last few months.  Seen by palliative care to help with pain management.  Patient apparently is followed by pain management in the outpatient setting.  He was on Dilaudid at home prior to admission.  Due to intentional overdose of narcotics are on hold currently.  He was started on Lyrica. Discussed with Dr. Hilma Favors with palliative as well as with Dr. Darleene Cleaver with psychiatry on 10/1.  We will proceed with prescribing methadone for now.  Leave him off of the hydromorphone.  Acute on chronic back pain Patient with longstanding history of degenerative disc disease.  CT of the lumbar spine was done yesterday which shows evidence for significant spinal stenosis in the L4-L5 and L5-S1 regions.  There is also evidence for nerve impingement.  Images were reviewed by Dr. Annette Stable with neurosurgery.  No indication for any intervention currently.  They will be happy to see him in their office in the next few weeks.  Apparently patient has not seen any of the neurosurgery provider in their office since 2014.  No focal neurological deficits noted.  Has been able to ambulate with a walker. Was seen by physical therapy.  He ambulated about 6 feet in the room with a walker.  Home health has been recommended. Currently on methadone as discussed above.  Constipation Aggressive bowel regimen prescribed.  Likely due to his chronic  narcotic use.  This is a chronic issue for him.  TSH was normal. Increase the dose of his laxatives.  Urinary tract infection UA was noted to be abnormal.  Given a 3-day course of ceftriaxone though it appears that he refused his dose last night.  Would not pursue this further.  Urine cultures were negative.  Insulin-dependent diabetes mellitus Home medication list reviewed.  Looks like he is on [place] and  NovoLog via pump.  Previous notes mention that he does not take his long-acting insulin at home and is apparently refusing here in the hospital.   CBGs noted to be occasionally high.  He is just on SSI.   Patient was seen by the diabetes coordinator.  Looks like they had a long discussion and patient adamantly refuses taking long-acting insulin due to his previous bad experience with low blood glucose levels in the middle of the night.  Despite education patient declines.  Continue just SSI for now.   HbA1c 10.0.    History of DVT/factor V Leyden mutation Patient is chronically anticoagulated with Eliquis which is being continued.  Chronic atrial fibrillation On carvedilol which is being continued.  Anticoagulated with apixaban.  Essential hypertension Occasional high readings noted.  Continue to monitor.  He is on losartan.  Nausea and vomiting This was in the setting of opioid overdose.  Seems to have improved.  Symptomatic treatment.  Rating his diet.  Hypercalcemia Calcium levels noted to be elevated.  Older labs reviewed.  He has a longstanding history.  His calcium levels were noted to be elevated in 2017 as well.  Does not appear to be symptomatic.  Will need outpatient monitoring and evaluation.  GERD Continue PPI  Nephrolithiasis Incidentally noted on CT scan.  Nonobstructing.  Obesity Estimated body mass index is 33.91 kg/m as calculated from the following:   Height as of 11/16/20: 6' (1.829 m).   Weight as of 11/16/20: 113.4 kg.    DVT Prophylaxis: Apixaban Code Status: Full code Family Communication: Discussed with patient.  No family at bedside Disposition Plan: Inpatient psychiatric admission.  Medically he is clear.  Will need home health for physical therapy when he returns home.   Status is: Inpatient  Remains inpatient appropriate because:Ongoing active pain requiring inpatient pain management, Inpatient level of care appropriate due to severity of illness, and  suicide attempt  Dispo: The patient is from: Home              Anticipated d/c is to:  To be determined              Patient currently is medically stable to transfer to psyche.   Difficult to place patient No      Medications: Scheduled:  apixaban  5 mg Oral BID   carvedilol  6.25 mg Oral QHS   Chlorhexidine Gluconate Cloth  6 each Topical Q0600   insulin aspart  0-15 Units Subcutaneous Q4H   losartan  25 mg Oral Daily   magnesium oxide  800 mg Oral QHS   mouth rinse  15 mL Mouth Rinse BID   melatonin  3 mg Oral q1800   methadone  5 mg Oral Q12H   pantoprazole  20 mg Oral Daily   polyethylene glycol  17 g Oral BID   pravastatin  20 mg Oral Once per day on Mon Thu   pregabalin  25 mg Oral QHS   senna-docusate  2 tablet Oral BID   Continuous:  cefTRIAXone (ROCEPHIN)  IV 1  g (11/19/20 2350)   promethazine (PHENERGAN) injection (IM or IVPB) Stopped (11/17/20 1007)   NLG:XQJJHERDEYCXK, calcium carbonate, furosemide, glucagon (human recombinant), hydrOXYzine, promethazine (PHENERGAN) injection (IM or IVPB), Theraworx Relief  Antibiotics: Anti-infectives (From admission, onward)    Start     Dose/Rate Route Frequency Ordered Stop   11/18/20 2300  cefTRIAXone (ROCEPHIN) 1 g in sodium chloride 0.9 % 100 mL IVPB        1 g 200 mL/hr over 30 Minutes Intravenous Every 24 hours 11/18/20 2152 11/21/20 2259       Objective:  Vital Signs  Vitals:   11/19/20 2130 11/20/20 1915 11/21/20 0326 11/21/20 0737  BP: 132/68 (!) 151/85 (!) 155/94 135/79  Pulse: 72 70 70 70  Resp:  19 17 18   Temp: 97.8 F (36.6 C) 98.6 F (37 C) 98.3 F (36.8 C) 98.1 F (36.7 C)  TempSrc: Oral   Oral  SpO2: 96% 97% 99% 97%    Intake/Output Summary (Last 24 hours) at 11/21/2020 0905 Last data filed at 11/21/2020 4818 Gross per 24 hour  Intake 240 ml  Output 1200 ml  Net -960 ml    There were no vitals filed for this visit.   General appearance: Awake alert.  In no distress.  Somewhat  anxious. Resp: Clear to auscultation bilaterally.  Normal effort Cardio: S1-S2 is normal regular.  No S3-S4.  No rubs murmurs or bruit GI: Abdomen is soft.  Nontender nondistended.  Bowel sounds are present normal.  No masses organomegaly Extremities: No edema.  Able to move both lower extremities Neurologic: Alert and oriented x3.  No focal neurological deficits.      Lab Results:  Data Reviewed: I have personally reviewed following labs and imaging studies  CBC: Recent Labs  Lab 11/17/20 0401 11/20/20 0538  WBC 10.3 7.6  NEUTROABS 5.6  --   HGB 11.7* 11.6*  HCT 35.2* 34.6*  MCV 91.7 90.6  PLT 364 402*     Basic Metabolic Panel: Recent Labs  Lab 11/17/20 0401 11/20/20 0538  NA 136 136  K 4.3 4.5  CL 109 104  CO2 19* 25  GLUCOSE 347* 182*  BUN 29* 25*  CREATININE 1.15 0.87  CALCIUM 11.2* 11.2*     GFR: Estimated Creatinine Clearance: 95.4 mL/min (by C-G formula based on SCr of 0.87 mg/dL).   CBG: Recent Labs  Lab 11/20/20 0742 11/20/20 1205 11/20/20 2142 11/21/20 0424 11/21/20 0741  GLUCAP 156* 237* 374* 196* 233*     Recent Results (from the past 240 hour(s))  Resp Panel by RT-PCR (Flu A&B, Covid) Nasopharyngeal Swab     Status: None   Collection Time: 11/17/20  4:59 AM   Specimen: Nasopharyngeal Swab; Nasopharyngeal(NP) swabs in vial transport medium  Result Value Ref Range Status   SARS Coronavirus 2 by RT PCR NEGATIVE NEGATIVE Final    Comment: (NOTE) SARS-CoV-2 target nucleic acids are NOT DETECTED.  The SARS-CoV-2 RNA is generally detectable in upper respiratory specimens during the acute phase of infection. The lowest concentration of SARS-CoV-2 viral copies this assay can detect is 138 copies/mL. A negative result does not preclude SARS-Cov-2 infection and should not be used as the sole basis for treatment or other patient management decisions. A negative result may occur with  improper specimen collection/handling, submission of  specimen other than nasopharyngeal swab, presence of viral mutation(s) within the areas targeted by this assay, and inadequate number of viral copies(<138 copies/mL). A negative result must be combined with clinical observations, patient  history, and epidemiological information. The expected result is Negative.  Fact Sheet for Patients:  EntrepreneurPulse.com.au  Fact Sheet for Healthcare Providers:  IncredibleEmployment.be  This test is no t yet approved or cleared by the [place] FDA and  has been authorized for detection and/or diagnosis of SARS-CoV-2 by FDA under an Emergency Use Authorization (EUA). This EUA will remain  in effect (meaning this test can be used) for the duration of the COVID-19 declaration under Section 564(b)(1) of the Act, 21 U.S.C.section 360bbb-3(b)(1), unless the authorization is terminated  or revoked sooner.       Influenza A by PCR NEGATIVE NEGATIVE Final   Influenza B by PCR NEGATIVE NEGATIVE Final    Comment: (NOTE) The Xpert Xpress SARS-CoV-2/FLU/RSV plus assay is intended as an aid in the diagnosis of influenza from Nasopharyngeal swab specimens and should not be used as a sole basis for treatment. Nasal washings and aspirates are unacceptable for Xpert Xpress SARS-CoV-2/FLU/RSV testing.  Fact Sheet for Patients: EntrepreneurPulse.com.au  Fact Sheet for Healthcare Providers: IncredibleEmployment.be  This test is not yet approved or cleared by the [place] FDA and has been authorized for detection and/or diagnosis of SARS-CoV-2 by FDA under an Emergency Use Authorization (EUA). This EUA will remain in effect (meaning this test can be used) for the duration of the COVID-19 declaration under Section 564(b)(1) of the Act, 21 U.S.C. section 360bbb-3(b)(1), unless the authorization is terminated or revoked.  Performed at [facility], [place]  [address] [place], [place] [address]   Urine Culture     Status: None   Collection Time: 11/17/20  9:14 AM   Specimen: Urine, Clean Catch  Result Value Ref Range Status   Specimen Description   Final    URINE, CLEAN CATCH Performed at [facility], [place] [address] [place], [place] [address]    Special Requests   Final    Normal Performed at [facility], [place] [address] [place], [place] [address]    Culture   Final    NO GROWTH Performed at [facility], [place] [address] [place], [name] [address]    Report Status 11/19/2020 FINAL  Final  MRSA Next Gen by PCR, Nasal     Status: None   Collection Time: 11/17/20  9:27 AM   Specimen: Nasal Mucosa; Nasal Swab  Result Value Ref Range Status   MRSA by PCR Next Gen NOT DETECTED NOT DETECTED Final    Comment: (NOTE) The GeneXpert MRSA Assay (FDA approved for NASAL specimens only), is one component of a comprehensive MRSA colonization surveillance program. It is not intended to diagnose MRSA infection nor to guide or monitor treatment for MRSA infections. Test performance is not FDA approved in patients less than 78 years old. Performed at [facility], [place] [address], [place], [place] [address]        Radiology Studies: No results found.     LOS: 4 days   Shaquan Missey [employer] on [email or web address]  11/21/2020, 9:05 AM

## 2020-11-21 NOTE — TOC Transition Note (Addendum)
Transition of Care [facility]) - CM/SW Discharge Note   Patient Details  Name: Derek [name] MRN: [id number] Date of Birth: 1945-10-18  Transition of Care [facility]) CM/SW Contact:  Derek [name], Derek Lamas, LCSW Phone Number: 11/21/2020, 12:44 PM   Clinical Narrative:     Secure chat message received from Freada Bergeron, RN, indicating that patient's daughter, Derek [name] reported having removed all of the guns from patient's home.  Ms. Chio is requesting a discharge planning meeting, involving attending physician, patient and case management/social worker.  Attending physician was encouraged to schedule time and date of convenience, notifying case management/social worker.  CSW will await further instruction.  Patient has been consulted by psych, who is recommending inpatient psychiatric hospitalization.  CSW will begin search.  Addendum: Patient was consulted by psych services, who is recommending inpatient psychiatric hospitalization, once medically stable and cleared for discharge.  CSW made numerous attempts to try and contact [place], [facility] Case Manager, [facility] Admissions Department, etc., but without success.  CSW then contacted Derek [name], Sunday Supervisor on call, to request assistance.  Derek [name] reported to [place] that psych services will begin pursing inpatient psychiatric hospitalization for patient, once he is more stable and ready for discharge.  No further follow-up was recommended for CSW today.    Barriers to Discharge: Continued Medical Work up   Patient Goals and CMS Choice Patient states their goals for this hospitalization and ongoing recovery are:: to go home    Discharge Placement   Inpatient Psychiatric Hospitalization.  Discharge Plan and Services   Discharge Planning Services: CM Consult  Social Determinants of Health (SDOH) Interventions  N/A  Readmission Risk Interventions No flowsheet data found.  Derek [name], Derek [name], Derek [name], Derek [name]  Licensed Holiday representative   [employer]  Mailing Address-[address] [place], Derek [name] Physical Address-[address], [place], Derek [name] Toll Free Main # [phone] Fax # [phone] Cell # [phone]  [name]

## 2020-11-22 DIAGNOSIS — E1165 Type 2 diabetes mellitus with hyperglycemia: Secondary | ICD-10-CM

## 2020-11-22 LAB — GLUCOSE, CAPILLARY
Glucose-Capillary: 248 mg/dL — ABNORMAL HIGH (ref 70–99)
Glucose-Capillary: 262 mg/dL — ABNORMAL HIGH (ref 70–99)
Glucose-Capillary: 272 mg/dL — ABNORMAL HIGH (ref 70–99)
Glucose-Capillary: 300 mg/dL — ABNORMAL HIGH (ref 70–99)
Glucose-Capillary: 335 mg/dL — ABNORMAL HIGH (ref 70–99)

## 2020-11-22 MED ORDER — LIDOCAINE 5 % EX PTCH
1.0000 | MEDICATED_PATCH | CUTANEOUS | Status: DC
  Administered 2020-11-22 – 2020-11-25 (×3): 1 via TRANSDERMAL
  Filled 2020-11-22 (×10): qty 1

## 2020-11-22 MED ORDER — METHADONE HCL 5 MG PO TABS: 5.0000 mg | ORAL_TABLET | Freq: Three times a day (TID) | ORAL | Status: DC

## 2020-11-22 MED ORDER — INSULIN ASPART 100 UNIT/ML IJ SOLN
0.0000 [IU] | Freq: Every day | INTRAMUSCULAR | Status: DC
  Administered 2020-11-22: 3 [IU] via SUBCUTANEOUS
  Administered 2020-11-24: 4 [IU] via SUBCUTANEOUS
  Administered 2020-11-25: 2 [IU] via SUBCUTANEOUS
  Administered 2020-11-26: 4 [IU] via SUBCUTANEOUS
  Administered 2020-11-28: 5 [IU] via SUBCUTANEOUS
  Administered 2020-11-29: 4 [IU] via SUBCUTANEOUS

## 2020-11-22 MED ORDER — BISACODYL 5 MG PO TBEC: 5.0000 mg | DELAYED_RELEASE_TABLET | Freq: Every day | ORAL | Status: DC | PRN

## 2020-11-22 MED ORDER — POLYETHYLENE GLYCOL 3350 17 G PO PACK
17.0000 g | PACK | Freq: Three times a day (TID) | ORAL | Status: DC
  Administered 2020-11-25 – 2020-11-29 (×2): 17 g via ORAL
  Filled 2020-11-22 (×8): qty 1

## 2020-11-22 MED ORDER — METHADONE HCL 5 MG PO TABS
5.0000 mg | ORAL_TABLET | Freq: Three times a day (TID) | ORAL | Status: DC
Start: 2020-11-22 — End: 2020-11-30
  Administered 2020-11-22 – 2020-11-29 (×23): 5 mg via ORAL
  Filled 2020-11-22 (×24): qty 1

## 2020-11-22 MED ORDER — INSULIN ASPART 100 UNIT/ML IJ SOLN
4.0000 [IU] | Freq: Three times a day (TID) | INTRAMUSCULAR | Status: DC
  Administered 2020-11-23 – 2020-11-24 (×2): 4 [IU] via SUBCUTANEOUS
  Administered 2020-11-27: 1 [IU] via SUBCUTANEOUS

## 2020-11-22 MED ORDER — PREGABALIN 50 MG PO CAPS
50.0000 mg | ORAL_CAPSULE | Freq: Once | ORAL | Status: AC
  Administered 2020-11-22: 50 mg via ORAL
  Filled 2020-11-22: qty 1

## 2020-11-22 MED ORDER — INSULIN ASPART 100 UNIT/ML IJ SOLN
0.0000 [IU] | Freq: Three times a day (TID) | INTRAMUSCULAR | Status: DC
  Administered 2020-11-22: 11 [IU] via SUBCUTANEOUS
  Administered 2020-11-23: 18 [IU] via SUBCUTANEOUS
  Administered 2020-11-23: 11 [IU] via SUBCUTANEOUS
  Administered 2020-11-24: 15 [IU] via SUBCUTANEOUS
  Administered 2020-11-24: 7 [IU] via SUBCUTANEOUS
  Administered 2020-11-25: 5 [IU] via SUBCUTANEOUS
  Administered 2020-11-26: 8 [IU] via SUBCUTANEOUS
  Administered 2020-11-26: 10 [IU] via SUBCUTANEOUS
  Administered 2020-11-27: 7 [IU] via SUBCUTANEOUS
  Administered 2020-11-27: 12 [IU] via SUBCUTANEOUS
  Administered 2020-11-27: 11 [IU] via SUBCUTANEOUS
  Administered 2020-11-28: 4 [IU] via SUBCUTANEOUS
  Administered 2020-11-28: 10 [IU] via SUBCUTANEOUS
  Administered 2020-11-29: 5 [IU] via SUBCUTANEOUS
  Administered 2020-11-29: 7 [IU] via SUBCUTANEOUS
  Administered 2020-11-29: 5 [IU] via SUBCUTANEOUS

## 2020-11-22 NOTE — Progress Notes (Signed)
Inpatient Diabetes Program Recommendations  AACE/ADA: New Consensus Statement on Inpatient Glycemic Control (2015)  Target Ranges:  Prepandial:   less than 140 mg/dL      Peak postprandial:   less than 180 mg/dL (1-2 hours)      Critically ill patients:  140 - 180 mg/dL   Lab Results  Component Value Date   GLUCAP 300 (H) 11/22/2020   HGBA1C 10.0 (H) 11/20/2020    Review of Glycemic Control Results for TAZ, Derek [name] (MRN [id number]) as of 11/22/2020 10:10  Ref. Range 11/21/2020 07:41 11/21/2020 12:00 11/21/2020 15:32 11/21/2020 19:17 11/22/2020 00:00 11/22/2020 04:00 11/22/2020 08:13  Glucose-Capillary Latest Ref Range: 70 - 99 mg/dL 233 (H) 360 (H) 355 (H) 292 (H) 335 (H) 248 (H) 300 (H)   Diabetes history: DM1 Outpatient Diabetes medications: Novolog 5-30 units TID before meals, Tresiba 14 units QAM (not taking d/t hypoglycemia) Current orders for Inpatient glycemic control:  Novolog 0-20 units tid + hs Novolog 4 units tid meal coverage  HgbA1C ordered. Last one 10.2%  Inpatient Diabetes Program Recommendations:    -  Change Novolog Correction to Q4 hours if pt agreeable to take Novolog at night -  Increase Novolog meal coverage to 6 units tid   Will follow glucose trends.  Thanks,  Tama Headings RN, MSN, BC-ADM Inpatient Diabetes Coordinator Team Pager [phone] (8a-5p)

## 2020-11-22 NOTE — Progress Notes (Signed)
Physical Therapy Treatment Patient Details Name: Derek [name] MRN: [id number] DOB: 08/13/1945 Today's Date: 11/22/2020   History of Present Illness patient is a 75 y/o gentleman with a history of chronic pain involving his lower back shoulder knees and abdomen.  Also has a history of pancreatic cancer status post pancreatectomy splenectomy, history of DVT on apixaban, history of factor V Leyden mutation.  He is also seen by pain management specialist.  He is on dilaudid at home.  He presented with uncontrolled chronic pain and intentional overdose    PT Comments    Pt progressing with mobility. Talking about his SI/SA during PT. Improved activity tolerance and gait stability. Pt will likely not need f/u PT post acute. Pt reluctant to amb without RW today, will work on progressing independence with gait next session. Pt instructed in sit<>stand and calf raises for strengthening as well as gentle knee to chest/LB stretches. Pt cooperative throughout PT session.   Recommendations for follow up therapy are one component of a multi-disciplinary discharge planning process, led by the attending physician.  Recommendations may be updated based on patient status, additional functional criteria and insurance authorization.  Follow Up Recommendations  No PT follow up     Equipment Recommendations  None recommended by PT    Recommendations for Other Services       Precautions / Restrictions Precautions Precautions: Fall     Mobility  Bed Mobility Overal bed mobility: Modified Independent                  Transfers   Equipment used: Rolling walker (2 wheeled) Transfers: Sit to/from Stand Sit to Stand: Supervision;Modified independent (Device/Increase time)         General transfer comment: no assist, cues for hand placement, able to control descent  Ambulation/Gait Ambulation/Gait assistance: Supervision Gait Distance (Feet): 400 Feet Assistive device: Rolling walker (2  wheeled) Gait Pattern/deviations: Step-through pattern     General Gait Details: steady gait with RW, pt declines walker  ht adjustment, states he "doctored it" and likes it where it is. pt declines amb without device today   Stairs             Wheelchair Mobility    Modified Rankin (Stroke Patients Only)       Balance   Sitting-balance support: No upper extremity supported;Feet supported Sitting balance-Leahy Scale: Good     Standing balance support: Single extremity supported;No upper extremity supported;During functional activity Standing balance-Leahy Scale:  (Fair to good, pt able to don one shoe in standing and manage LB garment without assist)                              Cognition Arousal/Alertness: Awake/alert Behavior During Therapy: WFL for tasks assessed/performed Overall Cognitive Status: Within Functional Limits for tasks assessed                                        Exercises      General Comments        Pertinent Vitals/Pain Pain Assessment: No/denies pain    Home Living                      Prior Function            PT Goals (current goals can now be found in the  care plan section) Acute Rehab PT Goals Patient Stated Goal: to ride motorcycle again PT Goal Formulation: With patient Time For Goal Achievement: 12/03/20 Potential to Achieve Goals: Good Progress towards PT goals: Progressing toward goals    Frequency    Min 3X/week      PT Plan Current plan remains appropriate    Co-evaluation              AM-PAC PT "6 Clicks" Mobility   Outcome Measure  Help needed turning from your back to your side while in a flat bed without using bedrails?: None Help needed moving from lying on your back to sitting on the side of a flat bed without using bedrails?: None Help needed moving to and from a bed to a chair (including a wheelchair)?: None Help needed standing up from a chair using  your arms (e.g., wheelchair or bedside chair)?: None Help needed to walk in hospital room?: A Little Help needed climbing 3-5 steps with a railing? : A Little 6 Click Score: 22    End of Session   Activity Tolerance: Patient tolerated treatment well Patient left: in bed;with call bell/phone within reach;with nursing/sitter in room Nurse Communication: Mobility status PT Visit Diagnosis: Unsteadiness on feet (R26.81)     Time: 4081-4481 PT Time Calculation (min) (ACUTE ONLY): 21 min  Charges:  $Gait Training: 8-22 mins                     Baxter Flattery, PT  Acute Rehab Dept ([place]) [phone] Pager [phone]  11/22/2020    [facility] 11/22/2020, 5:47 PM

## 2020-11-22 NOTE — TOC Progression Note (Addendum)
Transition of Care [facility]) - Progression Note    Patient Details  Name: Derek [name] MRN: [id number] Date of Birth: December 09, 1945  Transition of Care [facility]) CM/SW [place], [name] Phone Number: 11/22/2020, 1:56 PM  Clinical Narrative:   [facility] declined patient, stating he needs gero-psych placement.  Information sent to [place], [place], [place], [place] [place], [place] and [place].  Updated patient and wife. TOC will continue to follow during the course of hospitalization.     Expected Discharge Plan: Psychiatric Hospital Barriers to Discharge: Other (must enter comment) [facility] pending bed offer)  Expected Discharge Plan and Services Expected Discharge Plan: [facility]   Discharge Planning Services: CM Consult   Living arrangements for the past 2 months: Single Family Home                                       Social Determinants of Health (SDOH) Interventions    Readmission Risk Interventions No flowsheet data found.

## 2020-11-22 NOTE — Consult Note (Signed)
  75 y/o gentleman with a history of chronic pain involving his lower back shoulder knees and abdomen.  Also has a history of pancreatic cancer status post pancreatectomy splenectomy, history of DVT on apixaban, history of factor V Leyden mutation.  He is also seen by pain management specialist.  He is on dilaudid at home.  He presented with uncontrolled chronic pain and intentional overdose. He took >100 pills of dilaudid 8mg . His home dose is 8mg  up to 7 times per day as needed. He felt like his pain was too uncontrolled, but didn't feel better after his overdose. He presented to the ED with cool clammy skin and miosis. He was started on a narcan infusion in the ED with a goal of maintaining adequate respirations.  He was stabilized and then transferred to the hospitalist service.  Writer attempted to assess patient, however he is asleep.  Per safety sitter at bedside, patient had a rough night and recommend leaving him asleep.  She does state his daughter is present, and has been requesting to speak with the psychiatric service.  I was able to locate the daughter Gurkaran Rahm, at the end of the hallway looking out the window.  Writer introduced self, and reason for visiting her.  She appears to be open and shows much willingness to provide collateral information regarding her father.  She states her father has narcissistic tendencies, and has always threatened on made reference to harming himself but never did.  She states she still does not believe he took all of those pills, despite patient requiring several days of intensive care admission and Narcan drip.  She reports she does not agree with current decision to recommend inpatient psychiatric admission, however she verbalizes understanding."  I do not agree with it, but it is out of my control.  I have told dad that he needs to watch his mouth and watch what he says.  He can be very threatening and belittling, however he does not mean it. "  She further  verbalizes that she is afraid he is going to regress, and will not participate in any future medical procedures or tests, nor will he participate in psychiatric evaluation once he gets to inpatient psych facility.  Discussed with daughter current expectations, and what to expect upon admission.  She then becomes very tearful, although she is able to control her emotions and continues to have a linear conversation.  Writer made best attempt to validate her feelings and concerns, address all her questions at that time.  She further states she has no additional questions, comments, or concerns.  As she is noted to continue to cry and begin to look out the window.  She is advised psychiatry will continue to follow her father daily, until he is admitted to inpatient psych.  Further review risk factors that place him at high risk for suicide completion, and his ongoing need for inpatient psychiatric admission.  -Continue to recommend inpatient geriatric psych hospitalization at this time. -Continue involuntary commitment, as patient continues to be a danger to himself. -We will continue with current medications. -Psychiatry to continue to follow daily until patient is accepted into a geriatric psych facility.  Recommend working closely with TOC to initiate referral for inpatient admission.

## 2020-11-22 NOTE — Progress Notes (Signed)
[facility] PROGRESS NOTE   Derek [name] BJS:[id number] DOB: February 22, 1945 DOA: 11/17/2020  PCP: [name], MD  Brief History/Interval Summary: 75 y/o gentleman with a history of chronic pain involving his lower back shoulder knees and abdomen.  Also has a history of pancreatic cancer status post pancreatectomy splenectomy, history of DVT on apixaban, history of factor V Leyden mutation.  He is also seen by pain management specialist.  He is on dilaudid at home.  He presented with uncontrolled chronic pain and intentional overdose. He took >100 pills of dilaudid 8mg . His home dose is 8mg  up to 7 times per day as needed. He felt like his pain was too uncontrolled, but didn't feel better after his overdose. He presented to the ED with cool clammy skin and miosis. He was started on a narcan infusion in the ED with a goal of maintaining adequate respirations.  He was stabilized and then transferred to the hospitalist service.   Consultants: Psychiatry.  Palliative care.  Procedures: None yet   Subjective/Interval History: Patient mentions that he could not sleep last night due to back pain.  Denies any shortness of breath.  Still has not had a bowel movement.  He was offered enema which he declines.    Assessment/Plan:  Suicide attempt with intentional overdose of Dilaudid-depression Patient was in the intensive care unit on a Narcan infusion.  He has stabilized over the last 24 hours.  Currently off of narcotics.   Psychiatry was consulted.  Psychiatry recommends inpatient admission for psychiatric care.   Medically he is stable to be transferred to psychiatric facility.  Social worker has been consulted to assist.  Remains at high risk for self-harm.  Had to be involuntarily committed 10/1 as patient was threatening to leave. Patient has been refusing some of his medications and CBG checks at times.    Chronic pain syndrome He has pain chronically in his back shoulder both  his legs.  This has acutely worsened in the last few months.  Seen by palliative care to help with pain management.  Patient apparently is followed by pain management in the outpatient setting.  He was on Dilaudid at home prior to admission.  Due to intentional overdose of narcotics are on hold currently.  He was started on Lyrica. Discussed with Dr. Hilma Favors with palliative as well as with Dr. Darleene Cleaver with psychiatry on 10/1.   Recommendation was for not to continue with his hydromorphone.  He was placed on methadone.  Continues to have significant pain.  Could increase it to 3 times a day.    Acute on chronic back pain/spinal stenosis Patient with longstanding history of degenerative disc disease.  CT of the lumbar spine was done yesterday which shows evidence for significant spinal stenosis in the L4-L5 and L5-S1 regions.  There is also evidence for nerve impingement.   Images were reviewed by Dr. Annette Stable with neurosurgery.  No indication for any intervention currently.  They will be happy to see him in their office in the next few weeks.  Apparently patient has not seen any of the neurosurgery provider in their office since 2014.   Has been able to ambulate with a walker.  No focal neurological deficits has been noted.  Continue methadone as discussed above.  We will increase the dose today to 3 times daily.    Constipation Aggressive bowel regimen prescribed.  Likely due to his chronic narcotic use.  This is a chronic issue for him.  TSH  was normal. Laxative dose was increased.  Patient declines enema at this time.  Urinary tract infection UA was noted to be abnormal.  He was prescribed ceftriaxone which he declined.  Would not pursue this further. Urine cultures were negative.  Insulin-dependent diabetes mellitus, uncontrolled with hyperglycemia Home medication list reviewed.  Looks like he is on [place] and NovoLog via pump.  Previous notes mention that he does not take his long-acting insulin  at home and is apparently refusing here in the hospital.   Patient was seen by the diabetes coordinator.  Looks like they had a long discussion and patient adamantly refuses taking long-acting insulin due to his previous bad experience with low blood glucose levels in the middle of the night.  Despite education patient declines.   Continue just SSI for now.  HbA1c 10.0.    History of DVT/factor V Leyden mutation Patient is chronically anticoagulated with Eliquis which is being continued.  Chronic atrial fibrillation On carvedilol which is being continued.  Anticoagulated with apixaban.  Essential hypertension Occasional high readings noted.  Continue to monitor.  He is on losartan.  Nausea and vomiting This was in the setting of opioid overdose.  Seems to have improved.  Symptomatic treatment.  Rating his diet.  Hypercalcemia Calcium levels noted to be elevated.  Older labs reviewed.  He has a longstanding history.  His calcium levels were noted to be elevated in 2017 as well.  Does not appear to be symptomatic.  Will need outpatient monitoring and evaluation if not done previously.  GERD Continue PPI  Nephrolithiasis Incidentally noted on CT scan.  Nonobstructing.  Obesity Estimated body mass index is 33.91 kg/m as calculated from the following:   Height as of 11/16/20: 6' (1.829 m).   Weight as of 11/16/20: 113.4 kg.    DVT Prophylaxis: Apixaban Code Status: Full code Family Communication: Discussed with patient.  No family at bedside Disposition Plan: Inpatient psychiatric admission.  Medically he is clear.  Will need home health for physical therapy when he returns home.     Status is: Inpatient  Remains inpatient appropriate because:Ongoing active pain requiring inpatient pain management, Inpatient level of care appropriate due to severity of illness, and suicide attempt  Dispo: The patient is from: Home              Anticipated d/c is to:  To be determined               Patient currently is medically stable to transfer to psyche.   Difficult to place patient No      Medications: Scheduled:  apixaban  5 mg Oral BID   carvedilol  6.25 mg Oral QHS   Chlorhexidine Gluconate Cloth  6 each Topical Q0600   insulin aspart  0-15 Units Subcutaneous Q4H   lidocaine  1 patch Transdermal Q24H   losartan  25 mg Oral Daily   magnesium oxide  800 mg Oral QHS   mouth rinse  15 mL Mouth Rinse BID   melatonin  3 mg Oral q1800   methadone  5 mg Oral Q12H   pantoprazole  20 mg Oral Daily   polyethylene glycol  17 g Oral BID   pravastatin  20 mg Oral Once per day on Mon Thu   pregabalin  25 mg Oral QHS   senna-docusate  2 tablet Oral BID   Continuous:  promethazine (PHENERGAN) injection (IM or IVPB) Stopped (11/17/20 1007)   BTD:VVOHYWVPXTGGY, calcium carbonate, furosemide, glucagon (human recombinant), hydrOXYzine,  promethazine (PHENERGAN) injection (IM or IVPB), Theraworx Relief  Antibiotics: Anti-infectives (From admission, onward)    Start     Dose/Rate Route Frequency Ordered Stop   11/18/20 2300  cefTRIAXone (ROCEPHIN) 1 g in sodium chloride 0.9 % 100 mL IVPB        1 g 200 mL/hr over 30 Minutes Intravenous Every 24 hours 11/18/20 2152 11/21/20 2259       Objective:  Vital Signs  Vitals:   11/21/20 0326 11/21/20 0737 11/21/20 2138 11/22/20 0403  BP: (!) 155/94 135/79 128/73 133/73  Pulse: 70 70 74 (!) 58  Resp: 17 18 18 18   Temp: 98.3 F (36.8 C) 98.1 F (36.7 C) 98.6 F (37 C) 98.4 F (36.9 C)  TempSrc:  Oral Oral Oral  SpO2: 99% 97% 98% 100%    Intake/Output Summary (Last 24 hours) at 11/22/2020 0957 Last data filed at 11/22/2020 0000 Gross per 24 hour  Intake 1180 ml  Output --  Net 1180 ml    There were no vitals filed for this visit.   General appearance: Awake alert.  In no distress Resp: Clear to auscultation bilaterally.  Normal effort Cardio: S1-S2 is normal regular.  No S3-S4.  No rubs murmurs or bruit GI: Abdomen is  soft.  Nontender nondistended.  Bowel sounds are present normal.  No masses organomegaly Extremities: No edema.  Able to lift both legs off the bed. Neurologic:  No focal neurological deficits.     Lab Results:  Data Reviewed: I have personally reviewed following labs and imaging studies  CBC: Recent Labs  Lab 11/17/20 0401 11/20/20 0538  WBC 10.3 7.6  NEUTROABS 5.6  --   HGB 11.7* 11.6*  HCT 35.2* 34.6*  MCV 91.7 90.6  PLT 364 402*     Basic Metabolic Panel: Recent Labs  Lab 11/17/20 0401 11/20/20 0538  NA 136 136  K 4.3 4.5  CL 109 104  CO2 19* 25  GLUCOSE 347* 182*  BUN 29* 25*  CREATININE 1.15 0.87  CALCIUM 11.2* 11.2*     GFR: Estimated Creatinine Clearance: 95.4 mL/min (by C-G formula based on SCr of 0.87 mg/dL).   CBG: Recent Labs  Lab 11/21/20 1532 11/21/20 1917 11/22/20 0000 11/22/20 0400 11/22/20 0813  GLUCAP 355* 292* 335* 248* 300*     Recent Results (from the past 240 hour(s))  Resp Panel by RT-PCR (Flu A&B, Covid) Nasopharyngeal Swab     Status: None   Collection Time: 11/17/20  4:59 AM   Specimen: Nasopharyngeal Swab; Nasopharyngeal(NP) swabs in vial transport medium  Result Value Ref Range Status   SARS Coronavirus 2 by RT PCR NEGATIVE NEGATIVE Final    Comment: (NOTE) SARS-CoV-2 target nucleic acids are NOT DETECTED.  The SARS-CoV-2 RNA is generally detectable in upper respiratory specimens during the acute phase of infection. The lowest concentration of SARS-CoV-2 viral copies this assay can detect is 138 copies/mL. A negative result does not preclude SARS-Cov-2 infection and should not be used as the sole basis for treatment or other patient management decisions. A negative result may occur with  improper specimen collection/handling, submission of specimen other than nasopharyngeal swab, presence of viral mutation(s) within the areas targeted by this assay, and inadequate number of viral copies(<138 copies/mL). A negative  result must be combined with clinical observations, patient history, and epidemiological information. The expected result is Negative.  Fact Sheet for Patients:  EntrepreneurPulse.com.au  Fact Sheet for Healthcare Providers:  IncredibleEmployment.be  This test is no t yet  approved or cleared by the [place] and  has been authorized for detection and/or diagnosis of SARS-CoV-2 by FDA under an Emergency Use Authorization (EUA). This EUA will remain  in effect (meaning this test can be used) for the duration of the COVID-19 declaration under Section 564(b)(1) of the Act, 21 U.S.C.section 360bbb-3(b)(1), unless the authorization is terminated  or revoked sooner.       Influenza A by PCR NEGATIVE NEGATIVE Final   Influenza B by PCR NEGATIVE NEGATIVE Final    Comment: (NOTE) The Xpert Xpress SARS-CoV-2/FLU/RSV plus assay is intended as an aid in the diagnosis of influenza from Nasopharyngeal swab specimens and should not be used as a sole basis for treatment. Nasal washings and aspirates are unacceptable for Xpert Xpress SARS-CoV-2/FLU/RSV testing.  Fact Sheet for Patients: EntrepreneurPulse.com.au  Fact Sheet for Healthcare Providers: IncredibleEmployment.be  This test is not yet approved or cleared by the [place] FDA and has been authorized for detection and/or diagnosis of SARS-CoV-2 by FDA under an Emergency Use Authorization (EUA). This EUA will remain in effect (meaning this test can be used) for the duration of the COVID-19 declaration under Section 564(b)(1) of the Act, 21 U.S.C. section 360bbb-3(b)(1), unless the authorization is terminated or revoked.  Performed at [facility], [place] [address] [place], [place] [address]   Urine Culture     Status: None   Collection Time: 11/17/20  9:14 AM   Specimen: Urine, Clean Catch  Result Value Ref Range Status   Specimen  Description   Final    URINE, CLEAN CATCH Performed at [facility], [place] [address], [place], [place] [address]    Special Requests   Final    Normal Performed at [facility], [place] [address], [place], [place] [address]    Culture   Final    NO GROWTH Performed at [facility], [place] [address] [place], [place] [address]    Report Status 11/19/2020 FINAL  Final  MRSA Next Gen by PCR, Nasal     Status: None   Collection Time: 11/17/20  9:27 AM   Specimen: Nasal Mucosa; Nasal Swab  Result Value Ref Range Status   MRSA by PCR Next Gen NOT DETECTED NOT DETECTED Final    Comment: (NOTE) The GeneXpert MRSA Assay (FDA approved for NASAL specimens only), is one component of a comprehensive MRSA colonization surveillance program. It is not intended to diagnose MRSA infection nor to guide or monitor treatment for MRSA infections. Test performance is not FDA approved in patients less than 50 years old. Performed at [facility], [place] [address] [place], [place] [address]        Radiology Studies: No results found.     LOS: 5 days   Kerrie Timm [employer] on [email or web address]  11/22/2020, 9:57 AM

## 2020-11-22 NOTE — Progress Notes (Signed)
Pt refused to check the blood sugar.pt is refusing to eat his lunch.MD notified. Will continue monitor the pt.

## 2020-11-23 LAB — GLUCOSE, CAPILLARY
Glucose-Capillary: 372 mg/dL — ABNORMAL HIGH (ref 70–99)
Glucose-Capillary: 364 mg/dL — ABNORMAL HIGH (ref 70–99)
Glucose-Capillary: 257 mg/dL — ABNORMAL HIGH (ref 70–99)
Glucose-Capillary: 115 mg/dL — ABNORMAL HIGH (ref 70–99)
Glucose-Capillary: 175 mg/dL — ABNORMAL HIGH (ref 70–99)

## 2020-11-23 MED ORDER — HALOPERIDOL LACTATE 5 MG/ML IJ SOLN
2.0000 mg | Freq: Four times a day (QID) | INTRAMUSCULAR | Status: DC | PRN
  Administered 2020-11-24 – 2020-11-25 (×2): 2 mg via INTRAMUSCULAR
  Filled 2020-11-23 (×2): qty 1

## 2020-11-23 MED ORDER — LORAZEPAM 1 MG PO TABS
2.0000 mg | ORAL_TABLET | Freq: Once | ORAL | Status: AC
  Administered 2020-11-23: 2 mg via ORAL
  Filled 2020-11-23: qty 2

## 2020-11-23 MED ORDER — INSULIN GLARGINE-YFGN 100 UNIT/ML ~~LOC~~ SOLN
10.0000 [IU] | Freq: Every day | SUBCUTANEOUS | Status: DC
  Filled 2020-11-23 (×2): qty 0.1

## 2020-11-23 MED ORDER — HALOPERIDOL LACTATE 5 MG/ML IJ SOLN: 2.0000 mg | Freq: Four times a day (QID) | INTRAMUSCULAR | Status: DC | PRN

## 2020-11-23 NOTE — TOC Progression Note (Signed)
Transition of Care [facility]) - Progression Note    Patient Details  Name: Derek [name] MRN: [id number] Date of Birth: [birth year]  Transition of Care [facility]) CM/SW [name], [place] Phone Number: 11/23/2020, 12:57 PM  Clinical Narrative:   Felipa Evener back from no facilities that I FAXed patient out to yesterday.  Was informed by co-worker that [facility].  Called, and they said they had no evidence of received FAX.  Re-FAXed. TOC will continue to follow during the course of hospitalization.     Expected Discharge Plan: Psychiatric Hospital Barriers to Discharge: Other (must enter comment) [facility] pending bed offer)  Expected Discharge Plan and Services Expected Discharge Plan: [facility]   Discharge Planning Services: CM Consult   Living arrangements for the past 2 months: Single Family Home                                       Social Determinants of Health (SDOH) Interventions    Readmission Risk Interventions No flowsheet data found.

## 2020-11-23 NOTE — Progress Notes (Signed)
Patient refused full dose of insulin. CBG noted to be 364. Patient was originally supposed to receive 24 units of insulin. Patient states "I'm not taking that much insulin my sugars will drop. I'll take 18". Administered 18 units of insulin.

## 2020-11-23 NOTE — Care Management Important Message (Signed)
Important Message  Patient Details IM Letter given to the Patient. Name: Derek [name] MRN: [id number] Date of Birth: 1945-04-22   Medicare Important Message Given:  Yes     Kerin Salen 11/23/2020, 12:23 PM

## 2020-11-23 NOTE — Progress Notes (Signed)
Patient extremely agitated. Patient states "If I don't get out of this place I will tear it apart" Threatening to hit staff. MD notified.

## 2020-11-23 NOTE — Consult Note (Signed)
[facility] Consult   Reason for Consult:  Suicide attempt Referring Physician:  Dr. Maryland Pink Patient Identification: Derek [name] MRN:  [id number] Principal Diagnosis: <principal problem not specified> Diagnosis:  Active Problems:   Overdose, intentional self-harm, initial encounter ([place])   Intentional opiate overdose ([place])   Total Time spent with patient: 30 minutes  Subjective:   Derek [name] is a 75 y.o. male patient admitted with suicide attempt by overdose on 800mg  of Hydromorphone, in an attempt to end his life. He has been medically stable, with the exception of chronic pain management in which he is now transitioning to methadone as he overdosed on Hydromorphone. He continues to endorse suicidal ideations, and is unable to contract for safety. He continues to report depressive symptoms that are consistent with major depression disorder that included hopelessness, worthlessness, guilty, suicidal ideations, sadness, irritability, insomnia, isolation, and withdrawn. Patient is seen and reassessed by this nurse practitioner, he continues to present with suicidal ideations and depressive symptoms. His mood continues to fluctuate between irritability, sadness, blunt and labile. He is tangential in speech and thought process. He continues to meet inpatient criteria at this time, and will benefit from inpatient psychiatric hospitalizations. He is ambulatory with no assist and independent of his ADLs, he is observed to be sitting on the side of his bed unassisted.   HPI:  75 y/o gentleman with a history of chronic pain involving his lower back shoulder knees and abdomen.  Also has a history of pancreatic cancer status post pancreatectomy splenectomy, history of DVT on apixaban, history of factor V Leyden mutation.  He is also seen by pain management specialist.  He is on dilaudid at home.  He presented with uncontrolled chronic pain and intentional overdose. He took >100 pills  of dilaudid 8mg . His home dose is 8mg  up to 7 times per day as needed. He felt like his pain was too uncontrolled, but didn't feel better after his overdose. He presented to the ED with cool clammy skin and miosis. He was started on a narcan infusion in the ED with a goal of maintaining adequate respirations.  He was stabilized and then transferred to the hospitalist service.   Patient has been stable for the last several days.  After discussion with psychiatry and palliative care patient was started on methadone for his chronic pain.  Waiting on inpatient [facility] facility placement.  Past Psychiatric History: MDD, Delirium. No current outpatient psychiatric services.   Risk to Self:  Yes Risk to Others:   Denies Prior Inpatient Therapy:  Denies Prior Outpatient Therapy:   Denies  Past Medical History:  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" no pulmonary md, sees dr [name]   Factor V Leiden mutation ([place])    "never had blood clots"   GERD (gastroesophageal reflux disease)    Hiatal hernia    History of kidney stones    multiple kidney stones-not a problem at present   History of pancreatic cancer    01/ 2017  neuroendocrine pancreas tumor treated sugically -- s/p  distal pancreatectomy and splenectomy (per path islet cell, pT1 pNX) no further treatment   History of panic attacks    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    Past Surgical History:  Procedure Laterality Date   ANAL FISTULECTOMY N/A 04/01/2013   Procedure: EXAM UNDER ANESTHESIA WITH ANAL FISSURectomy, sphincterotomy and internal hemorrhoidectomy;  Surgeon: Harl Bowie, MD;  Location: [facility];  Service: General;  Laterality: N/A;   APPLICATION OF WOUND VAC N/A 05/16/2015   Procedure: APPLICATION OF WOUND VAC;  Surgeon: Armandina Gemma, MD;  Location: [facility];  Service: General;  Laterality: N/A;   APPLICATION OF WOUND VAC N/A 05/20/2015   Procedure: EXHANGE OF ABDOMINAL  WOUND VAC DRESSING;  Surgeon: Armandina Gemma, MD;  Location: [facility];  Service: General;  Laterality: N/A;   COLONOSCOPY     CYSTOSCOPY WITH INSERTION OF UROLIFT N/A 10/12/2016   Procedure: CYSTOSCOPY WITH INSERTION OF UROLIFT;  Surgeon: Franchot Gallo, MD;  Location: [facility];  Service: Urology;  Laterality: N/A;   EUS N/A 12/31/2014   Procedure: UPPER ENDOSCOPIC ULTRASOUND (EUS) LINEAR;  Surgeon: Milus Banister, MD;  [facility];  Service: Endoscopy;  Laterality: N/A;   HARDWARE REMOVAL Left 2013   left ankle   HEMORRHOIDECTOMY WITH HEMORRHOID BANDING     INCISION AND DRAINAGE ABSCESS N/A 05/14/2015   Procedure: DRAINAGE OF INFECTED PANCREATIC PSEUDOCYST;  Surgeon: Armandina Gemma, MD;  [facility];  Service: General;  Laterality: N/A;   INCISION AND DRAINAGE OF WOUND N/A 05/16/2015   Procedure: IRRIGATION AND DEBRIDEMENT WOUND;  Surgeon: Armandina Gemma, MD;  [facility];  Service:  General;  Laterality: N/A;   INCISIONAL HERNIA REPAIR N/A 11/12/2015   Procedure: REPAIR INCISIONAL HERNIA WITH MESH;  Surgeon: Armandina Gemma, MD;  Location: [facility];  Service: General;  Laterality: N/A;   INGUINAL HERNIA REPAIR Right 1974;  [place] N/A 11/12/2015   Procedure: INSERTION OF MESH;  Surgeon: Armandina Gemma, MD;  Location: [facility];  Service: General;  Laterality: N/A;   IR GENERIC HISTORICAL  04/20/2015   IR RADIOLOGIST EVAL & MGMT 04/20/2015 [facility] INTERV RAD   KNEE ARTHROSCOPY Bilateral right 08-08-2000;  left 11-23-2000   meniscal repair and chondroplasty's   KNEE ARTHROSCOPY  03/01/2012   Procedure: ARTHROSCOPY KNEE;  Surgeon: Gearlean Alf, MD;  Location[facility];  Service: Orthopedics;  Laterality: Left;  WITH SYNOVECTOMY    LAPAROTOMY N/A 05/14/2015   Procedure: EXPLORATORY LAPAROTOMY;  Surgeon: Armandina Gemma, MD;  [facility];  Service: General;  Laterality: N/A;   MASS EXCISION N/A 12/24/2019  Procedure: EXCISION LIP CARCINOMA;  Surgeon: Leta Baptist, MD;  Location: [facility];  Service: ENT;  Laterality: N/A;   ORIF LEFT ANKLE FX  08/20/2009   distal tib-fib and malleolus   ROTATOR CUFF REPAIR Bilateral right 1994; left 1993, left 1993   TOTAL KNEE ARTHROPLASTY Right 06/02/2013   Procedure: RIGHT TOTAL KNEE ARTHROPLASTY;  Surgeon: Gearlean Alf, MD;  [facility];  Service: Orthopedics;  Laterality: Right;   TOTAL KNEE ARTHROPLASTY Left 01-31-2008   dr Wynelle Link   TOTAL KNEE REVISION Right 01/23/2018   Procedure: right knee polyethylene revision;  Surgeon: Gaynelle Arabian, MD;  [facility];  Service: Orthopedics;  Laterality: Right;  33min   TRANSURETHRAL RESECTION OF PROSTATE N/A 04/21/2019   Procedure: TRANSURETHRAL RESECTION OF THE PROSTATE (TURP);  Surgeon: Franchot Gallo, MD;  [facility];  Service: Urology;  Laterality: N/A;   UMBILICAL HERNIA REPAIR  08-11-1999   dr Ninfa Linden   UPPER GASTROINTESTINAL ENDOSCOPY   sept 2016   WOUND EXPLORATION N/A 09/21/2016   Procedure: WOUND EXPLORATION AND DEBRIDEMENT ABDOMINAL WALL;  Surgeon: Armandina Gemma, MD;  [facility];  Service: General;  Laterality: N/A;   Family History:  Family History  Problem Relation Age of Onset   Bone cancer Father        Jaw   Factor V Leiden deficiency Daughter    Diabetes Maternal Grandmother    Heart disease Paternal Uncle    Colon cancer Neg Hx    Esophageal cancer Neg Hx    Stomach cancer Neg Hx    Rectal cancer Neg Hx    Family Psychiatric  History: zDenies Social History:  Social History   Substance and Sexual Activity  Alcohol Use No   Alcohol/week: 0.0 standard drinks     Social History   Substance and Sexual Activity  Drug Use No    Social History   Socioeconomic History   Marital status: Married    Spouse name: Ivin Booty   Number of children: 2   Years of education: Not on file   Highest education level: Not on file  Occupational History   Occupation: retired   Tobacco Use   Smoking status: Former    Packs/day: 4.00    Years: 17.00    Pack years: 68.00    Types: Cigarettes    Quit date: 02/21/1975    Years since quitting: 45.7   Smokeless tobacco: Never  Vaping Use   Vaping Use: Never used  Substance and Sexual Activity   Alcohol use: No    Alcohol/week: 0.0 standard drinks   Drug use: No   Sexual activity: Yes  Other Topics Concern   Not on file  Social History Narrative   Married, retired Scientist, product/process development   1 son 1 daughter   2 caffeine drinks/day   10/18/2014   Social Determinants of Health   Financial Resource Strain: Not on file  Food Insecurity: No Food Insecurity   Worried About Charity fundraiser in the Last Year: Never true   [place] in the Last Year: Never true  Transportation Needs: No Transportation Needs   Lack of Transportation (Medical): No   Lack of Transportation (Non-Medical): No  Physical Activity: Not on file  Stress: Not on file  Social Connections: Not  on file   Additional Social History:    Allergies:   Allergies  Allergen Reactions   Duloxetine Other (See Comments)    Night sweats   Doxycycline Other (See  Comments)    Severe nose bleeds   Morphine And Related Nausea And Vomiting, Swelling and Other (See Comments)    Facial swelling and lips swell, but tolerates immediate-release oxycodone as well as hydrocodone    Oxycontin [Oxycodone Hcl] Swelling and Other (See Comments)    Lips swell, but tolerates immediate-release oxycodone as well as hydrocodone   Quinine Other (See Comments)    Platelets dropped- consumptive coagulopathy   Sertraline Other (See Comments)    Can't sleep or urinate   Voltaren [Diclofenac Sodium] Other (See Comments)    Elevated liver enzymes   Diclofenac Swelling and Other (See Comments)    Elevated liver enzymes and lips swell   Doxycycline Hyclate Other (See Comments)    Epistaxis   Duloxetine Hcl Other (See Comments)    Night sweats   Gabapentin Other (See Comments)    Reaction??   Hydrocodone Swelling and Other (See Comments)    Facial swelling- can tolerate "immediate-release" Hydrocodone, though   Oxybutynin Other (See Comments)    Swelling    Sertraline Hcl Other (See Comments)    Urinary problems and insomnia   Tape Other (See Comments)    SKIN IS FRAGILE- CERTAIN BANDAGES TEAR OFF THE SKIN    Labs:  Results for orders placed or performed during the hospital encounter of 11/17/20 (from the past 48 hour(s))  Glucose, capillary     Status: Abnormal   Collection Time: 11/21/20  3:32 PM  Result Value Ref Range   Glucose-Capillary 355 (H) 70 - 99 mg/dL    Comment: Glucose reference range applies only to samples taken after fasting for at least 8 hours.   Comment 1 Notify RN    Comment 2 Document in Chart   Glucose, capillary     Status: Abnormal   Collection Time: 11/21/20  7:17 PM  Result Value Ref Range   Glucose-Capillary 292 (H) 70 - 99 mg/dL    Comment: Glucose reference range  applies only to samples taken after fasting for at least 8 hours.  Glucose, capillary     Status: Abnormal   Collection Time: 11/22/20 12:00 AM  Result Value Ref Range   Glucose-Capillary 335 (H) 70 - 99 mg/dL    Comment: Glucose reference range applies only to samples taken after fasting for at least 8 hours.  Glucose, capillary     Status: Abnormal   Collection Time: 11/22/20  4:00 AM  Result Value Ref Range   Glucose-Capillary 248 (H) 70 - 99 mg/dL    Comment: Glucose reference range applies only to samples taken after fasting for at least 8 hours.  Glucose, capillary     Status: Abnormal   Collection Time: 11/22/20  8:13 AM  Result Value Ref Range   Glucose-Capillary 300 (H) 70 - 99 mg/dL    Comment: Glucose reference range applies only to samples taken after fasting for at least 8 hours.  Glucose, capillary     Status: Abnormal   Collection Time: 11/22/20  5:38 PM  Result Value Ref Range   Glucose-Capillary 262 (H) 70 - 99 mg/dL    Comment: Glucose reference range applies only to samples taken after fasting for at least 8 hours.  Glucose, capillary     Status: Abnormal   Collection Time: 11/22/20  9:18 PM  Result Value Ref Range   Glucose-Capillary 272 (H) 70 - 99 mg/dL    Comment: Glucose reference range applies only to samples taken after fasting for at least 8 hours.  Glucose,  capillary     Status: Abnormal   Collection Time: 11/23/20  7:01 AM  Result Value Ref Range   Glucose-Capillary 372 (H) 70 - 99 mg/dL    Comment: Glucose reference range applies only to samples taken after fasting for at least 8 hours.  Glucose, capillary     Status: Abnormal   Collection Time: 11/23/20  8:03 AM  Result Value Ref Range   Glucose-Capillary 364 (H) 70 - 99 mg/dL    Comment: Glucose reference range applies only to samples taken after fasting for at least 8 hours.   Comment 1 Notify RN   Glucose, capillary     Status: Abnormal   Collection Time: 11/23/20 11:33 AM  Result Value Ref  Range   Glucose-Capillary 257 (H) 70 - 99 mg/dL    Comment: Glucose reference range applies only to samples taken after fasting for at least 8 hours.    Current Facility-Administered Medications  Medication Dose Route Frequency Provider Last Rate Last Admin   acetaminophen (TYLENOL) tablet 650 mg  650 mg Oral Q6H PRN [name], MD   650 mg at 11/23/20 1144   apixaban (ELIQUIS) tablet 5 mg  5 mg Oral BID [name] P, DO   5 mg at 11/23/20 0847   bisacodyl (DULCOLAX) EC tablet 5 mg  5 mg Oral Daily PRN [name], MD       calcium carbonate (TUMS - dosed in mg elemental calcium) chewable tablet 200 mg of elemental calcium  1 tablet Oral BID PRN [name] P, DO   200 mg of elemental calcium at 11/18/20 1617   carvedilol (COREG) tablet 6.25 mg  6.25 mg Oral QHS [name], MD   6.25 mg at 11/22/20 2303   furosemide (LASIX) tablet 20 mg  20 mg Oral Daily PRN [name] P, DO       glucagon (human recombinant) (GLUCAGEN) injection 1 mg  1 mg Intravenous Once PRN [name] P, DO       hydrOXYzine (ATARAX/VISTARIL) tablet 10 mg  10 mg Oral TID PRN [name] P, DO   10 mg at 11/22/20 1624   insulin aspart (novoLOG) injection 0-20 Units  0-20 Units Subcutaneous TID WC [name], MD   18 Units at 11/23/20 0909   insulin aspart (novoLOG) injection 0-5 Units  0-5 Units Subcutaneous QHS [name], MD   3 Units at 11/22/20 2304   insulin aspart (novoLOG) injection 4 Units  4 Units Subcutaneous TID WC [name], MD       insulin glargine-yfgn (SEMGLEE) injection 10 Units  10 Units Subcutaneous Daily [name], MD       lidocaine (LIDODERM) 5 % 1 patch  1 patch Transdermal Q24H Blount, Scarlette Shorts T, NP   1 patch at 11/23/20 0300   losartan (COZAAR) tablet 25 mg  25 mg Oral Daily [name], MD   25 mg at 11/23/20 0848   magnesium oxide (MAG-OX) tablet 800 mg  800 mg Oral QHS Chotiner, Yevonne Aline, MD   800 mg at 11/22/20 2304   MEDLINE mouth rinse  15 mL  Mouth Rinse BID [name] P, DO   15 mL at 11/23/20 1033   melatonin tablet 3 mg  3 mg Oral q1800 Cinderella, Margaret A   3 mg at 11/22/20 1745   methadone (DOLOPHINE) tablet 5 mg  5 mg Oral TID [name], MD   5 mg at 11/23/20 0848   pantoprazole (PROTONIX) EC tablet 20 mg  20 mg Oral Daily [name] P, DO   20 mg at 11/23/20 0849   polyethylene glycol (MIRALAX / GLYCOLAX) packet 17 g  17 g Oral TID [name], MD       pravastatin (PRAVACHOL) tablet 20 mg  20 mg Oral Once per day on Mon Thu [name], Laura P, DO   20 mg at 11/18/20 1000   pregabalin (LYRICA) capsule 25 mg  25 mg Oral QHS Cristal Generous, NP   25 mg at 11/22/20 2303   promethazine (PHENERGAN) 6.25 mg in sodium chloride 0.9 % 50 mL IVPB  6.25 mg Intravenous Q6H PRN [name], DO   Stopped at 11/17/20 1007   senna-docusate (Senokot-S) tablet 2 tablet  2 tablet Oral BID [name], MD   2 tablet at 11/21/20 2125   Theraworx Relief LIQD 1 application  1 application Apply externally Daily PRN Bowser, Laurel Dimmer, NP        Musculoskeletal: Strength & Muscle Tone: within normal limits Gait & Station: normal Patient leans: N/A            Psychiatric Specialty Exam:  Presentation  General Appearance: Appropriate for Environment; Casual  Eye Contact:Fair  Speech:Clear and Coherent; Normal Rate  Speech Volume:Normal  Handedness:Right   Mood and Affect  Mood:Irritable; Hopeless; Worthless  Affect:Depressed; Labile   Thought Process  Thought Processes:Coherent; Linear  Descriptions of Associations:Tangential  Orientation:Full (Time, Place and Person)  Thought Content:Tangential  History of Schizophrenia/Schizoaffective disorder:No data recorded Duration of Psychotic Symptoms:No data recorded Hallucinations:Hallucinations: None  Ideas of Reference:None  Suicidal Thoughts:Suicidal Thoughts: Yes, Passive SI Passive Intent and/or Plan: With Intent; Without Plan; With Means to [place]; With Access to Means  Homicidal Thoughts:Homicidal Thoughts: No   Sensorium  Memory:Immediate Fair; Recent Fair; Remote Fair  Judgment:Fair  Insight:Fair   Executive Functions  Concentration:Fair  Attention Span:Fair  [place]   Psychomotor Activity  Psychomotor Activity:Psychomotor Activity: Normal   Assets  Assets:Communication Skills; Leisure Time; Desire for Improvement; Financial Resources/Insurance; Housing; Social Support   Sleep  Sleep:Sleep: Fair   Physical Exam: Physical Exam ROS Blood pressure 128/66, pulse 80, temperature 98.1 F (36.7 C), temperature source Oral, resp. rate 20, SpO2 100 %. There is no height or weight on file to calculate BMI.  Treatment Plan Summary: Continue to recommend inpatient psych at geriatric facility. He is able to ambulate and independent of his ADLs. He maybe suitable for admission to traditional inpatient psychiatric facility.  -Continue IVC and 1:1 safety sitter, as he continues to endorse suicidal ideations and exhibit fluctuation in moods.  -Continue current medications at this time.  -Patient has been medically cleared, and pain management is appropriate at this time.   Disposition: Recommend psychiatric Inpatient admission when medically cleared.  Suella Broad, FNP 11/23/2020 12:15 PM

## 2020-11-23 NOTE — Progress Notes (Signed)
[facility] PROGRESS NOTE   Derek [name]:[id number] DOB: 1945/08/31 DOA: 11/17/2020  PCP: [name], MD  Brief History/Interval Summary: 75 y/o gentleman with a history of chronic pain involving his lower back shoulder knees and abdomen.  Also has a history of pancreatic cancer status post pancreatectomy splenectomy, history of DVT on apixaban, history of factor V Leyden mutation.  He is also seen by pain management specialist.  He is on dilaudid at home.  He presented with uncontrolled chronic pain and intentional overdose. He took >100 pills of dilaudid 8mg . His home dose is 8mg  up to 7 times per day as needed. He felt like his pain was too uncontrolled, but didn't feel better after his overdose. He presented to the ED with cool clammy skin and miosis. He was started on a narcan infusion in the ED with a goal of maintaining adequate respirations.  He was stabilized and then transferred to the hospitalist service.  Patient has been stable for the last several days.  After discussion with psychiatry and palliative care patient was started on methadone for his chronic pain.  Waiting on inpatient Gero-psych facility placement.   Consultants: Psychiatry.  Palliative care.  Procedures: None yet   Subjective/Interval History: Patient mentions that he continues to have back pain.  Does not have any relief from the methadone.  But he does not appear to be in any discomfort per se.  Moving all of his extremities.  Per nursing staff he has been erratic in taking some of his medications and the insulin.  Assessment/Plan:  Suicide attempt with intentional overdose of Dilaudid-depression Patient was in the intensive care unit on a Narcan infusion.  He has stabilized over the last 24 hours.  Currently off of narcotics.   Psychiatry was consulted.  Psychiatry recommends inpatient admission for psychiatric care.   Medically he is stable to be transferred to psychiatric facility.   He needs to go to a geriatric psychiatric facility.  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  Remains at high risk for self-harm.  Had to be involuntarily committed 10/1 as patient was threatening to leave. Patient has been refusing some of his medications and CBG checks at times.    Chronic pain syndrome He has pain chronically in his back shoulder both his legs.  This has acutely worsened in the last few months.  Seen by palliative care to help with pain management.  Patient apparently is followed by pain management in the outpatient setting.  He was on Dilaudid at home prior to admission.  Due to intentional overdose of narcotics are on hold currently.  He was started on Lyrica. Discussed with Dr. Hilma Favors with palliative as well as with Dr. Darleene Cleaver with psychiatry on 10/1.   Recommendation was for not to continue with his hydromorphone.  He was placed on methadone.  Continues to have significant pain.  Frequency was increased to 3 times a day.  Could consider escalation in dosage in the next 24 to 48 hours.    Acute on chronic back pain/spinal stenosis Patient with longstanding history of degenerative disc disease.  CT of the lumbar spine was done yesterday which shows evidence for significant spinal stenosis in the L4-L5 and L5-S1 regions.  There is also evidence for nerve impingement.   Images were reviewed by Dr. Annette Stable with neurosurgery.  No indication for any intervention currently.  They will be happy to see him in their office in the next few weeks.  Apparently patient has not seen  any of the neurosurgery provider in their office since 2014.   Has been able to ambulate with a walker.  No focal neurological deficits has been noted.   See above regarding methadone.  Constipation Aggressive bowel regimen prescribed.  Likely due to his chronic narcotic use.  This is a chronic issue for him.  TSH was normal. Laxative dose was increased.  Patient declines enema at this time.  Continues to have good bowel  sounds.  Abdomen remains benign.  Urinary tract infection UA was noted to be abnormal.  He was prescribed ceftriaxone which he declined.  Would not pursue this further. Urine cultures were negative.  Insulin-dependent diabetes mellitus, uncontrolled with hyperglycemia Home medication list reviewed.  Looks like he is on [place] and NovoLog via pump.  Previous notes mention that he does not take his long-acting insulin at home and is apparently refusing here in the hospital.   Patient was seen by the diabetes coordinator.  Looks like they had a long discussion and patient adamantly refuses taking long-acting insulin due to his previous bad experience with low blood glucose levels in the middle of the night.  Despite education patient declines.   HbA1c 10.0.  CBGs noted to be in the 300s this morning.  Will initiate glargine and will continue to educate patient.  History of DVT/factor V Leyden mutation Patient is chronically anticoagulated with Eliquis which is being continued.  Chronic atrial fibrillation On carvedilol which is being continued.  Anticoagulated with apixaban.  Essential hypertension Occasional high readings noted.  Continue to monitor.  He is on losartan.  Labs were last checked on 10/1 and his electrolytes and renal function was stable.  Can be checked weekly.  Nausea and vomiting This was in the setting of opioid overdose.  Seems to have improved.  Symptomatic treatment.  Rating his diet.  Hypercalcemia Calcium levels noted to be elevated.  Older labs reviewed.  He has a longstanding history.  His calcium levels were noted to be elevated in 2017 as well.  Does not appear to be symptomatic.  Will need outpatient monitoring and evaluation if not done previously.  GERD Continue PPI  Nephrolithiasis Incidentally noted on CT scan.  Nonobstructing.  Obesity Estimated body mass index is 33.91 kg/m as calculated from the following:   Height as of 11/16/20: 6' (1.829 m).    Weight as of 11/16/20: 113.4 kg.    DVT Prophylaxis: Apixaban Code Status: Full code Family Communication: Discussed with patient.  No family at bedside Disposition Plan: Inpatient psychiatric admission.  Medically he is clear.  Will need home health for physical therapy when he returns home.     Status is: Inpatient  Remains inpatient appropriate because:Ongoing active pain requiring inpatient pain management, Inpatient level of care appropriate due to severity of illness, and suicide attempt  Dispo: The patient is from: Home              Anticipated d/c is to:  To be determined              Patient currently is medically stable to transfer to psyche.   Difficult to place patient No      Medications: Scheduled:  apixaban  5 mg Oral BID   carvedilol  6.25 mg Oral QHS   insulin aspart  0-20 Units Subcutaneous TID WC   insulin aspart  0-5 Units Subcutaneous QHS   insulin aspart  4 Units Subcutaneous TID WC   lidocaine  1 patch Transdermal Q24H  losartan  25 mg Oral Daily   magnesium oxide  800 mg Oral QHS   mouth rinse  15 mL Mouth Rinse BID   melatonin  3 mg Oral q1800   methadone  5 mg Oral TID   pantoprazole  20 mg Oral Daily   polyethylene glycol  17 g Oral TID   pravastatin  20 mg Oral Once per day on Mon Thu   pregabalin  25 mg Oral QHS   senna-docusate  2 tablet Oral BID   Continuous:  promethazine (PHENERGAN) injection (IM or IVPB) Stopped (11/17/20 1007)   YQM:VHQIONGEXBMWU, bisacodyl, calcium carbonate, furosemide, glucagon (human recombinant), hydrOXYzine, promethazine (PHENERGAN) injection (IM or IVPB), Theraworx Relief  Antibiotics: Anti-infectives (From admission, onward)    Start     Dose/Rate Route Frequency Ordered Stop   11/18/20 2300  cefTRIAXone (ROCEPHIN) 1 g in sodium chloride 0.9 % 100 mL IVPB        1 g 200 mL/hr over 30 Minutes Intravenous Every 24 hours 11/18/20 2152 11/21/20 2259       Objective:  Vital Signs  Vitals:   11/22/20  0403 11/22/20 1137 11/22/20 2120 11/23/20 0438  BP: 133/73 126/65 111/67 128/66  Pulse: (!) 58 93 75 80  Resp: 18 18 18 20   Temp: 98.4 F (36.9 C) 98.5 F (36.9 C) 97.8 F (36.6 C) 98.1 F (36.7 C)  TempSrc: Oral Oral Oral Oral  SpO2: 100% 96% 100% 100%    Intake/Output Summary (Last 24 hours) at 11/23/2020 1109 Last data filed at 11/23/2020 0956 Gross per 24 hour  Intake 240 ml  Output --  Net 240 ml    There were no vitals filed for this visit.   General appearance: Awake alert.  In no distress Resp: Clear to auscultation bilaterally.  Normal effort Cardio: S1-S2 is normal regular.  No S3-S4.  No rubs murmurs or bruit GI: Abdomen is soft.  Nontender nondistended.  Bowel sounds are present normal.  No masses organomegaly Extremities: No edema.  Full range of motion of lower extremities. Neurologic: Alert and oriented x3.  No focal neurological deficits.     Lab Results:  Data Reviewed: I have personally reviewed following labs and imaging studies  CBC: Recent Labs  Lab 11/17/20 0401 11/20/20 0538  WBC 10.3 7.6  NEUTROABS 5.6  --   HGB 11.7* 11.6*  HCT 35.2* 34.6*  MCV 91.7 90.6  PLT 364 402*     Basic Metabolic Panel: Recent Labs  Lab 11/17/20 0401 11/20/20 0538  NA 136 136  K 4.3 4.5  CL 109 104  CO2 19* 25  GLUCOSE 347* 182*  BUN 29* 25*  CREATININE 1.15 0.87  CALCIUM 11.2* 11.2*     GFR: Estimated Creatinine Clearance: 95.4 mL/min (by C-G formula based on SCr of 0.87 mg/dL).   CBG: Recent Labs  Lab 11/22/20 0813 11/22/20 1738 11/22/20 2118 11/23/20 0701 11/23/20 0803  GLUCAP 300* 262* 272* 372* 364*     Recent Results (from the past 240 hour(s))  Resp Panel by RT-PCR (Flu A&B, Covid) Nasopharyngeal Swab     Status: None   Collection Time: 11/17/20  4:59 AM   Specimen: Nasopharyngeal Swab; Nasopharyngeal(NP) swabs in vial transport medium  Result Value Ref Range Status   SARS Coronavirus 2 by RT PCR NEGATIVE NEGATIVE Final     Comment: (NOTE) SARS-CoV-2 target nucleic acids are NOT DETECTED.  The SARS-CoV-2 RNA is generally detectable in upper respiratory specimens during the acute phase of infection. The  lowest concentration of SARS-CoV-2 viral copies this assay can detect is 138 copies/mL. A negative result does not preclude SARS-Cov-2 infection and should not be used as the sole basis for treatment or other patient management decisions. A negative result may occur with  improper specimen collection/handling, submission of specimen other than nasopharyngeal swab, presence of viral mutation(s) within the areas targeted by this assay, and inadequate number of viral copies(<138 copies/mL). A negative result must be combined with clinical observations, patient history, and epidemiological information. The expected result is Negative.  Fact Sheet for Patients:  EntrepreneurPulse.com.au  Fact Sheet for Healthcare Providers:  IncredibleEmployment.be  This test is no t yet approved or cleared by the [place] FDA and  has been authorized for detection and/or diagnosis of SARS-CoV-2 by FDA under an Emergency Use Authorization (EUA). This EUA will remain  in effect (meaning this test can be used) for the duration of the COVID-19 declaration under Section 564(b)(1) of the Act, 21 U.S.C.section 360bbb-3(b)(1), unless the authorization is terminated  or revoked sooner.       Influenza A by PCR NEGATIVE NEGATIVE Final   Influenza B by PCR NEGATIVE NEGATIVE Final    Comment: (NOTE) The Xpert Xpress SARS-CoV-2/FLU/RSV plus assay is intended as an aid in the diagnosis of influenza from Nasopharyngeal swab specimens and should not be used as a sole basis for treatment. Nasal washings and aspirates are unacceptable for Xpert Xpress SARS-CoV-2/FLU/RSV testing.  Fact Sheet for Patients: EntrepreneurPulse.com.au  Fact Sheet for Healthcare  Providers: IncredibleEmployment.be  This test is not yet approved or cleared by the [place] FDA and has been authorized for detection and/or diagnosis of SARS-CoV-2 by FDA under an Emergency Use Authorization (EUA). This EUA will remain in effect (meaning this test can be used) for the duration of the COVID-19 declaration under Section 564(b)(1) of the Act, 21 U.S.C. section 360bbb-3(b)(1), unless the authorization is terminated or revoked.  Performed at [facility], [place] [address] [place], [place] [address]   Urine Culture     Status: None   Collection Time: 11/17/20  9:14 AM   Specimen: Urine, Clean Catch  Result Value Ref Range Status   Specimen Description   Final    URINE, CLEAN CATCH Performed at [facility], [place] [address], [place], [place] [address]    Special Requests   Final    Normal Performed at [facility], [place] [address], [place], [place] [address]    Culture   Final    NO GROWTH Performed at [facility], [place] [address], [place], [place] [address]    Report Status 11/19/2020 FINAL  Final  MRSA Next Gen by PCR, Nasal     Status: None   Collection Time: 11/17/20  9:27 AM   Specimen: Nasal Mucosa; Nasal Swab  Result Value Ref Range Status   MRSA by PCR Next Gen NOT DETECTED NOT DETECTED Final    Comment: (NOTE) The GeneXpert MRSA Assay (FDA approved for NASAL specimens only), is one component of a comprehensive MRSA colonization surveillance program. It is not intended to diagnose MRSA infection nor to guide or monitor treatment for MRSA infections. Test performance is not FDA approved in patients less than 76 years old. Performed at [facility], [place] [address] [place], [place] [address]        Radiology Studies: No results found.     LOS: 6 days   [facility] Pager on  [email or web address]  11/23/2020, 11:09 AM

## 2020-11-24 DIAGNOSIS — K5903 Drug induced constipation: Secondary | ICD-10-CM | POA: Diagnosis present

## 2020-11-24 DIAGNOSIS — I10 Essential (primary) hypertension: Secondary | ICD-10-CM

## 2020-11-24 DIAGNOSIS — G8929 Other chronic pain: Secondary | ICD-10-CM | POA: Diagnosis present

## 2020-11-24 DIAGNOSIS — I82593 Chronic embolism and thrombosis of other specified deep vein of lower extremity, bilateral: Secondary | ICD-10-CM

## 2020-11-24 DIAGNOSIS — G4733 Obstructive sleep apnea (adult) (pediatric): Secondary | ICD-10-CM

## 2020-11-24 DIAGNOSIS — M549 Dorsalgia, unspecified: Secondary | ICD-10-CM | POA: Diagnosis present

## 2020-11-24 DIAGNOSIS — T402X5A Adverse effect of other opioids, initial encounter: Secondary | ICD-10-CM | POA: Diagnosis present

## 2020-11-24 LAB — GLUCOSE, CAPILLARY
Glucose-Capillary: 302 mg/dL — ABNORMAL HIGH (ref 70–99)
Glucose-Capillary: 227 mg/dL — ABNORMAL HIGH (ref 70–99)
Glucose-Capillary: 321 mg/dL — ABNORMAL HIGH (ref 70–99)

## 2020-11-24 MED ORDER — INSULIN GLARGINE-YFGN 100 UNIT/ML ~~LOC~~ SOLN
14.0000 [IU] | Freq: Every day | SUBCUTANEOUS | Status: DC
  Filled 2020-11-24 (×6): qty 0.14

## 2020-11-24 MED ORDER — QUETIAPINE FUMARATE 25 MG PO TABS
25.0000 mg | ORAL_TABLET | Freq: Every day | ORAL | Status: DC
  Administered 2020-11-24 – 2020-11-29 (×6): 25 mg via ORAL
  Filled 2020-11-24 (×6): qty 1

## 2020-11-24 NOTE — Progress Notes (Signed)
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  Patient is followed by a pain management specialist.  He had been receiving ketamine for pain which had given him significant relief, however this was put on hold after he had his latest DVT.  He has been placed on Dilaudid and felt like his pain remained uncontrolled so he took a large number of pills, reportedly greater than 100 and presented to the emergency room with intentional overdose.  Patient admitted to the hospitalist service and started on Narcan infusion.  Stabilized and admitted to the hospitalist service.  Has been seen by palliative care and started on methadone for his chronic pain.  Seen by psychiatry and his wife are amenable to patient getting inpatient help and currently waiting on inpatient Geri psych facility.  Overnight, apparently patient became disoriented and confused/aggressive.  Security required when he attempted to take apart the bathroom sink and use it as a weapon.  Today, he is much more calm and alert.  In discussion with patient's wife and patient, he stated the main therapy that did work was the ketamine which she has been unable to get due to DVT.  Extensively discussed DVT recurrence and interventional radiology consulted for IVC filter placement.  Patient continues to complain of severe pain in his back  Assessment/Plan: Active Problems:   Obstructive sleep apnea   Poorly controlled type 2 diabetes mellitus ([place]): Continued elevated blood sugars.  Have increased Semglee    Benign essential HTN: Occasionally elevated blood  pressures, likely in the setting of pain.   Deep venous thrombosis ([place]): History of factor V Leiden, with persistent recurrence despite being on Eliquis including last month.  This is led to his ketamine treatments for pain being put on hold.  There is some literature reports of increased incidence of DVT in ketamine treatments.  However, patient's pain control physician is reported that as long as a DVT is being treated, they can can resume treatment.  In discussion with the patient and his wife, they are amenable to having interventional radiology see patient for IVC filter which was done today.  IVC filter to be placed hopefully in the next few days.    Overdose, intentional self-harm, initial encounter [facility]): Being followed by psychiatry.  For inpatient psych.  Chronic atrial fibrillation: On Eliquis and Coreg.    Chronic back pain/long-term use of narcotics, followed by pain management.  Does not feel that Dilaudid is doing much.  Seen by psychiatry as well as palliative care who recommended changing him over to methadone.  He did report relief with ketamine.  See above.    Constipation due to opioid therapy: On bowel regimen which should help.   Code Status: Full code  Family Communication: Wife at the bedside  Disposition Plan: Discharge once IVC filter placed and geriatric psych inpatient facility available   Consultants: Psychiatry Palliative care Interventional radiology  Procedures: Planned IVC filter  Antimicrobials: None  DVT prophylaxis: Eliquis  Level of care: Med-Surg   Objective: Vitals:   11/23/20 2332 11/24/20 0635  BP: (!) 141/87 (!) 145/84  Pulse: 67 83  Resp:  20  Temp:  99.2 F (37.3 C)  SpO2:  96%    Intake/Output Summary (Last 24 hours) at 11/24/2020 1614 Last data filed at 11/24/2020 1300 Gross per 24 hour  Intake 740 ml  Output 500 ml  Net 240 ml   There were no vitals filed for this visit. There is no height or weight on file to  calculate BMI.  Exam:  General: Alert and oriented x3, no acute distress HEENT: Normocephalic and atraumatic, mucous membranes are slightly dry Cardiovascular: Irregular rhythm, rate controlled Respiratory: Clear to auscultation bilaterally Abdomen: Soft, nontender, nondistended, positive bowel sounds Musculoskeletal: No clubbing or cyanosis, trace pitting edema Skin: No skin breaks, tears or lesions Psychiatry: Currently appropriate, no evidence of psychoses Neurology: No focal deficits   Data Reviewed: CBC: Recent Labs  Lab 11/20/20 0538  WBC 7.6  HGB 11.6*  HCT 34.6*  MCV 90.6  PLT 096*   Basic Metabolic Panel: Recent Labs  Lab 11/20/20 0538  NA 136  K 4.5  CL 104  CO2 25  GLUCOSE 182*  BUN 25*  CREATININE 0.87  CALCIUM 11.2*   GFR: Estimated Creatinine Clearance: 95.4 mL/min (by C-G formula based on SCr of 0.87 mg/dL). Liver Function Tests: Recent Labs  Lab 11/20/20 0538  AST 18  ALT 22  ALKPHOS 65  BILITOT 0.4  PROT 6.6  ALBUMIN 3.2*   No results for input(s): LIPASE, AMYLASE in the last 168 hours. No results for input(s): AMMONIA in the last 168 hours. Coagulation Profile: No results for input(s): INR, PROTIME in the last 168 hours. Cardiac Enzymes: No results for input(s): CKTOTAL, CKMB, CKMBINDEX, TROPONINI in the last 168 hours. BNP (last 3 results) No results for input(s): PROBNP in the last 8760 hours. HbA1C: No results for input(s): HGBA1C in the last 72 hours. CBG: Recent Labs  Lab 11/23/20 0803 11/23/20 1133 11/23/20 1616 11/23/20 2007 11/24/20 1036  GLUCAP 364* 257* 115* 175* 302*   Lipid Profile: No results for input(s): CHOL, HDL, LDLCALC, TRIG, CHOLHDL, LDLDIRECT in the last 72 hours. Thyroid Function Tests: No results for input(s): TSH, T4TOTAL, FREET4, T3FREE, THYROIDAB in the last 72 hours. Anemia Panel: No results for input(s): VITAMINB12, FOLATE, FERRITIN, TIBC, IRON, RETICCTPCT in the last 72 hours. Urine  analysis:    Component Value Date/Time   COLORURINE YELLOW (A) 11/17/2020 2230   APPEARANCEUR CLEAR (A) 11/17/2020 2230   LABSPEC 1.020 11/17/2020 2230   PHURINE 6.0 11/17/2020 2230   GLUCOSEU >1,000 (A) 11/17/2020 2230   HGBUR LARGE (A) 11/17/2020 2230   BILIRUBINUR NEGATIVE 11/17/2020 2230   BILIRUBINUR negative 07/04/2011 1902   KETONESUR NEGATIVE 11/17/2020 2230   PROTEINUR 100 (A) 11/17/2020 2230   UROBILINOGEN 0.2 05/22/2013 0924   NITRITE POSITIVE (A) 11/17/2020 2230   LEUKOCYTESUR NEGATIVE 11/17/2020 2230   Sepsis Labs: @LABRCNTIP (procalcitonin:4,lacticidven:4)  ) Recent Results (from the past 240 hour(s))  Resp Panel by RT-PCR (Flu A&B, Covid) Nasopharyngeal Swab     Status: None   Collection Time: 11/17/20  4:59 AM   Specimen: Nasopharyngeal Swab; Nasopharyngeal(NP) swabs in vial transport medium  Result Value Ref Range Status   SARS Coronavirus 2 by RT PCR NEGATIVE NEGATIVE Final    Comment: (NOTE) SARS-CoV-2 target nucleic acids are NOT DETECTED.  The SARS-CoV-2 RNA is generally detectable in upper respiratory specimens during the acute phase of infection. The lowest concentration of SARS-CoV-2 viral copies this assay can detect is 138 copies/mL. A negative result does not preclude SARS-Cov-2 infection and should not be used as  the sole basis for treatment or other patient management decisions. A negative result may occur with  improper specimen collection/handling, submission of specimen other than nasopharyngeal swab, presence of viral mutation(s) within the areas targeted by this assay, and inadequate number of viral copies(<138 copies/mL). A negative result must be combined with clinical observations, patient history, and epidemiological information. The expected result is Negative.  Fact Sheet for Patients:  EntrepreneurPulse.com.au  Fact Sheet for Healthcare Providers:  IncredibleEmployment.be  This test is no t yet  approved or cleared by the [place] FDA and  has been authorized for detection and/or diagnosis of SARS-CoV-2 by FDA under an Emergency Use Authorization (EUA). This EUA will remain  in effect (meaning this test can be used) for the duration of the COVID-19 declaration under Section 564(b)(1) of the Act, 21 U.S.C.section 360bbb-3(b)(1), unless the authorization is terminated  or revoked sooner.       Influenza A by PCR NEGATIVE NEGATIVE Final   Influenza B by PCR NEGATIVE NEGATIVE Final    Comment: (NOTE) The Xpert Xpress SARS-CoV-2/FLU/RSV plus assay is intended as an aid in the diagnosis of influenza from Nasopharyngeal swab specimens and should not be used as a sole basis for treatment. Nasal washings and aspirates are unacceptable for Xpert Xpress SARS-CoV-2/FLU/RSV testing.  Fact Sheet for Patients: EntrepreneurPulse.com.au  Fact Sheet for Healthcare Providers: IncredibleEmployment.be  This test is not yet approved or cleared by the [place] FDA and has been authorized for detection and/or diagnosis of SARS-CoV-2 by FDA under an Emergency Use Authorization (EUA). This EUA will remain in effect (meaning this test can be used) for the duration of the COVID-19 declaration under Section 564(b)(1) of the Act, 21 U.S.C. section 360bbb-3(b)(1), unless the authorization is terminated or revoked.  Performed at [facility], [place] [address] [place], [place] [address]   Urine Culture     Status: None   Collection Time: 11/17/20  9:14 AM   Specimen: Urine, Clean Catch  Result Value Ref Range Status   Specimen Description   Final    URINE, CLEAN CATCH Performed at [facility], [place] [address], [place], [place] [address]    Special Requests   Final    Normal Performed at [facility], [place] [address] [place], [place] [address]    Culture   Final    NO GROWTH Performed at  [facility], [place] [address] [place], [place] [address]    Report Status 11/19/2020 FINAL  Final  MRSA Next Gen by PCR, Nasal     Status: None   Collection Time: 11/17/20  9:27 AM   Specimen: Nasal Mucosa; Nasal Swab  Result Value Ref Range Status   MRSA by PCR Next Gen NOT DETECTED NOT DETECTED Final    Comment: (NOTE) The GeneXpert MRSA Assay (FDA approved for NASAL specimens only), is one component of a comprehensive MRSA colonization surveillance program. It is not intended to diagnose MRSA infection nor to guide or monitor treatment for MRSA infections. Test performance is not FDA approved in patients less than 44 years old. Performed at [facility], [place] [address] [place], [place] [address]       Studies: No results found.  Scheduled Meds:  apixaban  5 mg Oral BID   carvedilol  6.25 mg Oral QHS   insulin aspart  0-20 Units Subcutaneous TID WC   insulin aspart  0-5 Units Subcutaneous QHS   insulin aspart  4 Units  Subcutaneous TID WC   insulin glargine-yfgn  10 Units Subcutaneous Daily   lidocaine  1 patch Transdermal Q24H   losartan  25 mg Oral Daily   magnesium oxide  800 mg Oral QHS   mouth rinse  15 mL Mouth Rinse BID   melatonin  3 mg Oral q1800   methadone  5 mg Oral TID   pantoprazole  20 mg Oral Daily   polyethylene glycol  17 g Oral TID   pravastatin  20 mg Oral Once per day on Mon Thu   pregabalin  25 mg Oral QHS   QUEtiapine  25 mg Oral QHS   senna-docusate  2 tablet Oral BID    Continuous Infusions:  promethazine (PHENERGAN) injection (IM or IVPB) Stopped (11/17/20 1007)     LOS: 7 days     Annita Brod, MD [facility]   11/24/2020, 4:14 PM

## 2020-11-24 NOTE — Progress Notes (Signed)
Patient out of restraints using urinal. Patient noted to have refused CBG and insulin this morning.

## 2020-11-24 NOTE — Progress Notes (Signed)
Pt asleep with sitter at bedside. RN entered pts room to complete assessment & administer scheduled medications. RN awoke pt and asked if she could take his BP & administer his medications. Pt nods his head and states "yes". RN completes taking blood pressure & then proceeds to hand patient medication cup explaining what each medication is and purpose. Pt takes medication cup, looks into the cups, then states "I am not taking anything until I talk to my wife, where is my wife." RN attempted to reorient pt letting him know he was in the hospital, it was 11:30 at night and his wife was probably home. Pt sits up on the side of the bed and demands "Call my wife now." RN placed call to patients wife, and handed pt the phone. Pt spoke to wife for a couple of minutes then became very upset and threw the phone across the room. Pt then proceeds to get out of bed stating he had to use the bathroom and starts to walk toward the bathroom. Pt then slams & locks the door. RN & sitter explained to pt for his safety he was not able to shut the door. Pt yells "Get out of my room & leave me alone." Staff proceeded to unlock bathroom door and pt is actively pulling at the metal plumbing on the toilet. Pt proceeds to pull metal pole from toilet and begans charging at staff waving the metal pole. Pt walks into the hallway waving metal pole. Security was called.   Staff able to get patient back into his room safely. Pt is verbally aggressive towards staff, cursing & stating multiple threats. NP notified of behavior and bilateral wrist and ankle restraints initiated.  RN called pts wife again and she stated she was on her way to the hospital.   Pts wife arrived, house supervisor at bedside, explained situation to wife.  Pt remains in restraints, safety ensured, sitter at bedside, frequent rounding in place.

## 2020-11-24 NOTE — Progress Notes (Signed)
    OVERNIGHT PROGRESS REPORT  Visited patient bedside who is now calm and in restraints after getting up to go to the bathroom and then tearing the plumbing from the wall and confronting staff to the point of requiring security.  Currently he is calm with a family member present. Family member came to hospital after notification by nursing staff request.    Gershon Cull MSNA MSN [place] [facility] Nurse Practitioner [place]

## 2020-11-24 NOTE — Progress Notes (Addendum)
Patient out of restraints using the restroom and taking medications. 4 point restraints reapplied afterwards for safety.

## 2020-11-24 NOTE — Progress Notes (Addendum)
Pt sitting up in bed eating dinner. Pt pleasantly interacting with staff & following commands. Wife at bedside. Pt agreeable to POC, allowing staff to complete tasks and medication compliance. Pt not verbally or physically aggressive at this time. RN let pt & wife know with change in behavior pt meets criteria for discontinuation of restraints. Wife & patient agreeable. Sitter at bedside, pts safety ensured, reassurance offered, frequent rounding in place.

## 2020-11-24 NOTE — Progress Notes (Signed)
Referring Physician(s): Lyman Speller  Supervising Physician: Jacqulynn Cadet  Patient Status:  [facility] - In-pt  Chief Complaint: Recurrent lower extremity DVT despite anticoagulation   Subjective: Pt known to IR service from pancreatic resection bed drain placement, left mid abdominal drain placement in 2017, left pleural space drain/repositioning and upsizing of left abdominal drain in 2017, and subsequent drain exchanges.  He is a 75 year old male with history of chronic pain involving his lower back/shoulder, knees and abdomen who was admitted on 9/28 after suicide attempt with intentional overdose of Dilaudid.  He has a past history significant for pancreatic cancer with prior pancreatectomy, splenectomy, BPH, DDD, anxiety/depression, GERD, nephrolithiasis, panic attacks, hypertension, diabetes, obstructive sleep apnea, peripheral vascular disease ,history of bilat DVT on Eliquis, history of factor V Leiden mutation.  Patient has been started on methadone for his chronic pain.  Of note patient has had some verbally aggressive behavior towards staff recently and currently in four point restraints. Latest right lower extremity venous Doppler on 11/07/2020 revealed nonocclusive deep venous thrombus in the right common femoral and femoral veins with acute superficial greater saphenous vein thrombosis in the mid to distal thigh.  Request now received from primary care team for IVC filter placement. Pt denies fever,HA, CP ,dyspnea, N/V or bleeding. He does have some back and LE pain.   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" no pulmonary md, sees dr [name]   Factor V  Leiden mutation ([place])    "never had blood clots"   GERD (gastroesophageal reflux disease)    Hiatal hernia    History of kidney stones    multiple kidney stones-not a problem at present   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, pT1 pNX) no further treatment   History of panic attacks    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   Past Surgical History:  Procedure Laterality Date   ANAL FISTULECTOMY N/A 04/01/2013   Procedure: EXAM UNDER ANESTHESIA WITH ANAL FISSURectomy, sphincterotomy and internal hemorrhoidectomy;  Surgeon: Harl Bowie, MD;  Location: [place];  Service: General;  Laterality: N/A;  APPLICATION OF WOUND VAC N/A 05/16/2015   Procedure: APPLICATION OF WOUND VAC;  Surgeon: Armandina Gemma, MD;  [facility];  Service: General;  Laterality: N/A;   APPLICATION OF WOUND VAC N/A 05/20/2015   Procedure: EXHANGE OF ABDOMINAL  WOUND VAC DRESSING;  Surgeon: Armandina Gemma, MD;  [facility];  Service: General;  Laterality: N/A;   COLONOSCOPY     CYSTOSCOPY WITH INSERTION OF UROLIFT N/A 10/12/2016   Procedure: CYSTOSCOPY WITH INSERTION OF UROLIFT;  Surgeon: Franchot Gallo, MD;  Location: [facility];  Service: Urology;  Laterality: N/A;   EUS N/A 12/31/2014   Procedure: UPPER ENDOSCOPIC ULTRASOUND (EUS) LINEAR;  Surgeon: Milus Banister, MD;  [facility];  Service: Endoscopy;  Laterality: N/A;   HARDWARE REMOVAL Left 2013   left ankle   HEMORRHOIDECTOMY WITH HEMORRHOID BANDING     INCISION AND DRAINAGE ABSCESS N/A 05/14/2015   Procedure: DRAINAGE OF INFECTED PANCREATIC PSEUDOCYST;  Surgeon: Armandina Gemma, MD;  [facility];  Service: General;  Laterality: N/A;   INCISION AND DRAINAGE OF WOUND N/A 05/16/2015   Procedure: IRRIGATION AND DEBRIDEMENT WOUND;  Surgeon: Armandina Gemma, MD;  [facility];  Service: General;  Laterality: N/A;   INCISIONAL HERNIA REPAIR N/A 11/12/2015   Procedure: REPAIR INCISIONAL HERNIA WITH MESH;  Surgeon: Armandina Gemma, MD;  Location: [place];  Service: General;  Laterality: N/A;   INGUINAL HERNIA REPAIR Right 1974;  [place] N/A 11/12/2015   Procedure: INSERTION OF MESH;  Surgeon: Armandina Gemma, MD;  Location: [place];  Service: General;  Laterality: N/A;   IR GENERIC HISTORICAL  04/20/2015   IR RADIOLOGIST EVAL & MGMT 04/20/2015 [facility] INTERV RAD   KNEE ARTHROSCOPY Bilateral right 08-08-2000;  left 11-23-2000   meniscal repair and chondroplasty's   KNEE ARTHROSCOPY  03/01/2012   Procedure: ARTHROSCOPY KNEE;  Surgeon: Gearlean Alf, MD;  Location: [facility];  Service: Orthopedics;  Laterality: Left;  WITH SYNOVECTOMY    LAPAROTOMY N/A 05/14/2015   Procedure: EXPLORATORY LAPAROTOMY;  Surgeon: Armandina Gemma, MD;  [facility];  Service: General;  Laterality: N/A;   MASS EXCISION N/A 12/24/2019   Procedure: EXCISION LIP CARCINOMA;  Surgeon: Leta Baptist, MD;  Location: [place];  Service: ENT;  Laterality: N/A;   ORIF LEFT ANKLE FX  08/20/2009   distal tib-fib and malleolus   ROTATOR CUFF REPAIR Bilateral right 1994; left 1993, left 1993   TOTAL KNEE ARTHROPLASTY Right 06/02/2013    Procedure: RIGHT TOTAL KNEE ARTHROPLASTY;  Surgeon: Gearlean Alf, MD;  [facility];  Service: Orthopedics;  Laterality: Right;   TOTAL KNEE ARTHROPLASTY Left 01-31-2008   dr Wynelle Link   TOTAL KNEE REVISION Right 01/23/2018   Procedure: right knee polyethylene revision;  Surgeon: Gaynelle Arabian, MD;  [facility];  Service: Orthopedics;  Laterality: Right;  36min   TRANSURETHRAL RESECTION OF PROSTATE N/A 04/21/2019   Procedure: TRANSURETHRAL RESECTION OF THE PROSTATE (TURP);  Surgeon: Franchot Gallo, MD;  Location: [facility];  Service: Urology;  Laterality: N/A;   UMBILICAL HERNIA REPAIR  08-11-1999   dr Ninfa Linden   UPPER GASTROINTESTINAL ENDOSCOPY  sept 2016   WOUND EXPLORATION N/A 09/21/2016   Procedure: WOUND EXPLORATION AND DEBRIDEMENT ABDOMINAL WALL;  Surgeon: Armandina Gemma, MD;  [facility];  Service: General;  Laterality: N/A;      Allergies: Duloxetine, Doxycycline, Morphine and related, Oxycontin [oxycodone hcl], Quinine, Sertraline, Voltaren [diclofenac sodium], Diclofenac, Doxycycline hyclate,  Duloxetine hcl, Gabapentin, Hydrocodone, Oxybutynin, Sertraline hcl, and Tape  Medications: Prior to Admission medications   Medication Sig Start Date End Date Taking? Authorizing Provider  apixaban (ELIQUIS) 5 MG TABS tablet Take 1 tablet by mouth twice a day 09/13/20  Yes   AUSTRALIAN DREAM ARTHRITIS 0.025 % CREA Apply 1 application topically 3 (three) times daily as needed (to painful joints).   Yes [provider]  carvedilol (COREG) 25 MG tablet Take one tablet by mouth once daily in the evening. Patient taking differently: Take 25 mg by mouth at bedtime. 07/01/20  Yes   fluorouracil (EFUDEX) 5 % cream Apply 1 application externally twice a day 09/06/20  Yes   furosemide (LASIX) 20 MG tablet Take 20 mg by mouth daily as needed for edema.   Yes [provider]  Homeopathic Products [facility] RELIEF) FOAM Apply 1 application topically daily as  needed (for knee cramps).   Yes [provider]  HYDROmorphone (DILAUDID) 8 MG tablet Take 1 tablet by mouth 7 times a day as needed 11/11/20  Yes   hydrOXYzine (VISTARIL) 25 MG capsule Take 1 capsule by mouth every 8 hours as needed for stress 08/24/20  Yes   insulin aspart (NOVOLOG) 100 UNIT/ML injection USE 100U/DAY SUBCUTANEOUS VIA PUMP 30 DAY(S) Patient taking differently: Inject 5-30 Units into the skin See admin instructions. Inject 5-30 units into the skin three times a day before meals, PER SLIDING SCALE 12/23/19 12/22/20 Yes Balan, Bindubal, MD  insulin degludec (TRESIBA FLEXTOUCH) 100 UNIT/ML FlexTouch Pen Inject 14 units Subcutaneous Once a day 30 days Patient taking differently: Inject 14 Units into the skin daily before breakfast. 07/22/20  Yes   lansoprazole (PREVACID) 30 MG capsule Take one capsule by mouth once daily. Patient taking differently: Take 30 mg by mouth at bedtime. 07/01/20  Yes   losartan (COZAAR) 50 MG tablet Take one tablet by mouth every evening. Patient taking differently: Take 50 mg by mouth at bedtime. 07/01/20  Yes   magnesium oxide (MAG-OX) 400 MG tablet Take 800 mg by mouth at bedtime. Triple complex   Yes [provider]  megestrol (MEGACE) 40 MG tablet Take one tablet by mouth twice daily. Patient taking differently: Take 40 mg by mouth at bedtime. 07/01/20  Yes   pravastatin (PRAVACHOL) 20 MG tablet Take one tablet by mouth twice weekly. Patient taking differently: Take 20 mg by mouth 2 (two) times a week. 07/01/20  Yes   senna (SENOKOT) 8.6 MG TABS tablet Take 1 tablet by mouth in the morning and at bedtime.   Yes [provider]  triamcinolone cream (KENALOG) 0.1 % Apply externally two times a day for 30 days. 10/07/20  Yes   amoxicillin-clavulanate (AUGMENTIN) 875-125 MG tablet Take 1 tablet by mouth every 12 hours Patient not taking: No sig reported 08/16/20     clindamycin (CLEOCIN) 150 MG capsule Take 2 capsules by mouth now then take  1 capsule 3 times a day until gone Patient not taking: No sig reported 11/01/20     COVID-19 mRNA vaccine, Pfizer, 30 MCG/0.3ML injection INJECT AS DIRECTED Patient not taking: No sig reported 01/05/20 01/04/21  Carlyle Basques, MD  doxycycline (VIBRA-TABS) 100 MG tablet Take 1 tablet by mouth twice a day Patient not taking: No sig reported 09/22/20     finasteride (PROSCAR) 5 MG tablet Take 1 tablet by mouth once daily Patient not taking: No sig reported 10/01/20     insulin aspart (NOVOLOG) 100 UNIT/ML injection Use 100 units  daily under the skin via pump Patient not taking: Reported on 11/17/2020 10/11/20     Insulin Syringe-Needle U-100 31G X 5/16" 1 ML MISC USE AS DIRECTED TO INJECT 01/19/20 01/18/21  Jacelyn Pi, MD  nitrofurantoin, macrocrystal-monohydrate, (MACROBID) 100 MG capsule Take 1 capsule by mouth once daily Patient not taking: No sig reported 10/01/20     phenazopyridine (PYRIDIUM) 200 MG tablet Take 1 tablet by mouth every 8 hours as needed for urinary pain Patient not taking: Reported on 11/17/2020 10/01/20     polyethylene glycol powder (MIRALAX) 17 GM/SCOOP powder Mix 1 scoop (17g) in 8 ounces of water or juice once daily Patient not taking: No sig reported 08/02/20     Turmeric 500 MG CAPS Take 500 mg by mouth at bedtime. Patient not taking: No sig reported    [provider]     Vital Signs: BP (!) 145/84 (BP Location: Left Arm)   Pulse 83   Temp 99.2 F (37.3 C) (Oral)   Resp 20   SpO2 96%   Physical Exam: pt awake/sl drowsy; answers simple questions; chest- CTA bilat; heart- RRR; abd- soft,+BS,NT; some mild pretibial edema, sl more on right  Imaging: No results found.  Labs:  CBC: Recent Labs    08/18/20 1317 11/07/20 1815 11/17/20 0401 11/20/20 0538  WBC 6.9 9.9 10.3 7.6  HGB 11.7* 12.0* 11.7* 11.6*  HCT 34.7* 35.9* 35.2* 34.6*  PLT 359 350 364 402*    COAGS: No results for input(s): INR, APTT in the last 8760 hours.  BMP: Recent Labs     08/18/20 1317 11/07/20 1815 11/17/20 0401 11/20/20 0538  NA 131* 132* 136 136  K 4.7 4.7 4.3 4.5  CL 106 103 109 104  CO2 19* 23 19* 25  GLUCOSE 199* 231* 347* 182*  BUN 26* 27* 29* 25*  CALCIUM 10.5* 10.8* 11.2* 11.2*  CREATININE 1.14 0.90 1.15 0.87  GFRNONAA >60 >60 >60 >60    LIVER FUNCTION TESTS: Recent Labs    08/06/20 2153 08/18/20 1317 11/07/20 1815 11/20/20 0538  BILITOT 0.4 0.5 0.9 0.4  AST 15 39 74* 18  ALT 17 20 29 22   ALKPHOS 83 74 64 65  PROT 7.1 6.1* 6.5 6.6  ALBUMIN 3.7 3.2* 3.5 3.2*    Assessment and Plan: Pt known to IR service from pancreatic resection bed drain placement, left mid abdominal drain placement in 2017, left pleural space drain/repositioning and upsizing of left abdominal drain in 2017, and subsequent drain exchanges.  He is a 75 year old male with history of chronic pain involving his lower back/shoulder, knees and abdomen who was admitted on 9/28 after suicide attempt with intentional overdose of Dilaudid.  He has a past history significant for pancreatic cancer with prior pancreatectomy, splenectomy, BPH, DDD, anxiety/depression, GERD, nephrolithiasis, panic attacks, hypertension, diabetes, obstructive sleep apnea, peripheral vascular disease ,history of bilat DVT on Eliquis, history of factor V Leiden mutation.  Patient has been started on methadone for his chronic pain.  Of note patient has had some verbally aggressive behavior towards staff recently and currently in four point restraints. Latest right lower extremity venous Doppler on 11/07/2020 revealed nonocclusive deep venous thrombus in the right common femoral and femoral veins with acute superficial greater saphenous vein thrombosis in the mid to distal thigh.  Request now received from primary care team for IVC filter placement.  Case has been reviewed by Dr. Laurence Ferrari.Risks and benefits discussed with the patient/spouse  including, but not limited to bleeding, infection, contrast  induced renal failure, filter fracture or migration which can lead to emergency surgery or even death, strut penetration with damage or irritation to adjacent structures and caval thrombosis.  All of the patient's questions were answered, patient /spouse are agreeable to proceed. Consent signed and in chart.  Procedure tentatively scheduled for 10/6   Electronically Signed: D. Rowe Robert, PA-C 11/24/2020, 2:36 PM   I spent a total of 25 minutes at the the patient's bedside AND on the patient's hospital floor or unit, greater than 50% of which was counseling/coordinating care for IVC filter placement    Patient ID: Derek [name], male   DOB: [birth year], 75 y.o.   MRN: [id number]

## 2020-11-24 NOTE — Plan of Care (Signed)
  Problem: Education: Goal: Knowledge of General Education information will improve Description: Including pain rating scale, medication(s)/side effects and non-pharmacologic comfort measures Outcome: Progressing   Problem: Health Behavior/Discharge Planning: Goal: Ability to manage health-related needs will improve Outcome: Progressing   Problem: Clinical Measurements: Goal: Ability to maintain clinical measurements within normal limits will improve Outcome: Progressing Goal: Will remain free from infection Outcome: Progressing Goal: Diagnostic test results will improve Outcome: Progressing   Problem: Coping: Goal: Level of anxiety will decrease Outcome: Progressing   Problem: Pain Managment: Goal: General experience of comfort will improve Outcome: Progressing   Problem: Safety: Goal: Ability to remain free from injury will improve Outcome: Progressing

## 2020-11-24 NOTE — TOC Progression Note (Addendum)
Transition of Care [facility]) - Progression Note    Patient Details  Name: Derek [name] MRN: [id number] Date of Birth: 1945/10/03  Transition of Care [facility]) CM/SW [place], [place] Phone Number: 11/24/2020, 2:20 PM  Clinical Narrative:   Called admissions at [facility] to inquire about bed offer.  They have deemed him "too medically complex."  Updated Ms Lavina Hamman in psychiatry. TOC will continue to follow during the course of hospitalization.  Addendum:  [name] was originally only IVC'd for 3 days.  IVC updated today via [place].  Due again 10/11.      Expected Discharge Plan: [facility] Barriers to Discharge: Other (must enter comment) [facility] pending bed offer)  Expected Discharge Plan and Services Expected Discharge Plan: [facility]   Discharge Planning Services: CM Consult   Living arrangements for the past 2 months: Single Family Home                                       Social Determinants of Health (SDOH) Interventions    Readmission Risk Interventions No flowsheet data found.

## 2020-11-24 NOTE — Progress Notes (Signed)
PT Cancellation Note  Patient Details Name: Derek [name] MRN: [id number] DOB: 16-Nov-1945   Cancelled Treatment:    Reason Eval/Treat Not Completed: Medical issues which prohibited therapy, events of night noted. Patient is ambulatory with sitter when [place] allows., This Probation officer observed 10/4.Will follow as able.   [name] 11/24/2020, 6:58 AM [place] Pager [phone] Office [phone]

## 2020-11-24 NOTE — Progress Notes (Signed)
Removed pt from restraints and assisted to bathroom.  Pt found falling asleep in the toilet and propping self on walker. Notified pt that if he is sleepy will need to return to bed, due to cannot allow to sit on toilet and sleep due to safety risk.  Pt became verbally agitated and push is walker out from in front of him.  Pt got up and assisted self back to bed with walker and would not allow staff to help him.  4 point restraints reapplied and re-educated pt on release criteria.  Pt upset and refusing VS at this time.  [place] LPN made aware.  Sitter at bedside and aware of safety plan and precautions.

## 2020-11-24 NOTE — Progress Notes (Signed)
OT Cancellation Note  Patient Details Name: Derek [name] MRN: [id number] DOB: [birth year]   Cancelled Treatment:    Reason Eval/Treat Not Completed: Medical issues which prohibited therapy patient noted to have increased agitation past evening with restraints on today. Will check back when able.  Jackelyn Poling OTR/L, [place] [facility] Office# [phone] Pager# [phone]    11/24/2020, 1:51 PM

## 2020-11-24 NOTE — Consult Note (Signed)
  Patient is seen and attempted to reassess this morning, he was observed to be sleeping. His safety sitter reported that he recently went to sleep after a rough night. She reports that this morning patient has been very apologetic about his actions that transpired during the course of the night. Per chart review patient was exhibiting some visual hallucinations and delusional, responding to external stimuli.    Please see nurses note from last night. Patient continues to be exhibiting symptoms consistent with delirium  which presents as fluctuating cognition.    Last night he appeared to have altered sensorium, perceptual disturbances, visual hallucinations, disorientation, and cognitive deficits that are markedly different from his baseline.     Virtually any medical condition or physiologic stress can precipitate delirium in a susceptible individual, with risk increasing in those with: advanced age, sensory impairments, organic brain disease (stroke, dementia, Parkinsons), psychiatric illness, major chronic medical issues, prolonged hospitalizations, postoperative status, anemia, insomnia/disturbed sleep, and severe pain. Addressing the underlying medical condition and institution of preventative measures are recommended.   After speaking with the wife she reports that his agitation may have been a result of him receiving ativan yesterday evening. She reports he has experience this type of reaction before when he had surgery.   She is now in agree ance for her husband to receive inpatient psychiatric help. We discussed at length the grounds for IVC, POA. Living will, capacity vs competency. She initially was guarded but as we continued in conversation she begin to open up, and her tone became more engaging. She is requesting a copy of the IVC, and has been advised historically these records are obtained through medical records, although we do keep an original in the physical chart.   -Will start  Seroquel 25mg  po qhs for delirium and sedative side effects. If patient continues to present with agitation to degree of physical harm and bodily harm to himself or others will add mirtazapine.  -Continue IVC and safety sitter, as patient remains in and out of restraints.  - Continue to monitor and treat underlying medical causes of delirium, including infection, electrolyte disturbances, etc. - Delirium precautions - Minimize/avoid deliriogenic meds including: anticholinergic, opiates, benzodiazepines (received ativan hours before last night event)            - Maintain hydration, oxygenation, nutrition           - Limit use of restraints and catheters           - Normalize sleep patterns by minimizing nighttime noise, light and interruptions           - Provide sensory aids like glasses, hearing aids           - Encourage ambulation, regular activities and visitors to maintain cognitive stimulation   -Patient would benefit from having family members at bedside to reinforce his orientation.  -Continue seeking inpatient psychiatric admission at this time.

## 2020-11-25 ENCOUNTER — Inpatient Hospital Stay (HOSPITAL_COMMUNITY): Payer: HMO

## 2020-11-25 ENCOUNTER — Encounter (HOSPITAL_COMMUNITY): Payer: Self-pay | Admitting: Interventional Radiology

## 2020-11-25 DIAGNOSIS — M545 Low back pain, unspecified: Secondary | ICD-10-CM

## 2020-11-25 DIAGNOSIS — G8929 Other chronic pain: Secondary | ICD-10-CM

## 2020-11-25 DIAGNOSIS — F322 Major depressive disorder, single episode, severe without psychotic features: Secondary | ICD-10-CM | POA: Diagnosis not present

## 2020-11-25 DIAGNOSIS — T50902A Poisoning by unspecified drugs, medicaments and biological substances, intentional self-harm, initial encounter: Secondary | ICD-10-CM | POA: Diagnosis not present

## 2020-11-25 HISTORY — PX: IR RADIOLOGY PERIPHERAL GUIDED IV START: IMG5598

## 2020-11-25 HISTORY — PX: IR US GUIDE VASC ACCESS RIGHT: IMG2390

## 2020-11-25 HISTORY — PX: IR IVC FILTER PLMT / S&I /IMG GUID/MOD SED: IMG701

## 2020-11-25 LAB — BASIC METABOLIC PANEL
Anion gap: 6 (ref 5–15)
BUN: 25 mg/dL — ABNORMAL HIGH (ref 8–23)
CO2: 20 mmol/L — ABNORMAL LOW (ref 22–32)
Calcium: 10.6 mg/dL — ABNORMAL HIGH (ref 8.9–10.3)
Chloride: 106 mmol/L (ref 98–111)
Creatinine, Ser: 1.02 mg/dL (ref 0.61–1.24)
GFR, Estimated: >60 mL/min (ref >60)
Glucose, Bld: 229 mg/dL — ABNORMAL HIGH (ref 70–99)
Potassium: 4 mmol/L (ref 3.5–5.1)
Sodium: 132 mmol/L — ABNORMAL LOW (ref 135–145)

## 2020-11-25 LAB — CBC WITH DIFFERENTIAL/PLATELET
Abs Immature Granulocytes: 0.03 10*3/uL (ref 0.00–0.07)
Basophils Absolute: 0.1 10*3/uL (ref 0.0–0.1)
Basophils Relative: 1 %
Eosinophils Absolute: 0.2 10*3/uL (ref 0.0–0.5)
Eosinophils Relative: 3 %
HCT: 34.8 % — ABNORMAL LOW (ref 39.0–52.0)
Hemoglobin: 11.7 g/dL — ABNORMAL LOW (ref 13.0–17.0)
Immature Granulocytes: 0 %
Lymphocytes Relative: 40 %
Lymphs Abs: 2.9 10*3/uL (ref 0.7–4.0)
MCH: 30.3 pg (ref 26.0–34.0)
MCHC: 33.6 g/dL (ref 30.0–36.0)
MCV: 90.2 fL (ref 80.0–100.0)
Monocytes Absolute: 1.5 10*3/uL — ABNORMAL HIGH (ref 0.1–1.0)
Monocytes Relative: 19 %
Neutro Abs: 2.8 10*3/uL (ref 1.7–7.7)
Neutrophils Relative %: 37 %
Platelets: 438 10*3/uL — ABNORMAL HIGH (ref 150–400)
RBC: 3.86 MIL/uL — ABNORMAL LOW (ref 4.22–5.81)
RDW: 13.3 % (ref 11.5–15.5)
WBC: 7.5 10*3/uL (ref 4.0–10.5)
nRBC: 0 % (ref 0.0–0.2)

## 2020-11-25 LAB — GLUCOSE, CAPILLARY
Glucose-Capillary: 212 mg/dL — ABNORMAL HIGH (ref 70–99)
Glucose-Capillary: 236 mg/dL — ABNORMAL HIGH (ref 70–99)

## 2020-11-25 LAB — PROTIME-INR
INR: 1.3 — ABNORMAL HIGH (ref 0.8–1.2)
Prothrombin Time: 15.8 seconds — ABNORMAL HIGH (ref 11.4–15.2)

## 2020-11-25 MED ORDER — HALOPERIDOL LACTATE 5 MG/ML IJ SOLN
5.0000 mg | Freq: Four times a day (QID) | INTRAMUSCULAR | Status: DC | PRN
Start: 2020-11-25 — End: 2020-11-30
  Administered 2020-11-25: 5 mg via INTRAMUSCULAR
  Filled 2020-11-25: qty 1

## 2020-11-25 MED ORDER — IOHEXOL 300 MG/ML  SOLN
100.0000 mL | Freq: Once | INTRAMUSCULAR | Status: AC | PRN
  Administered 2020-11-25: 55 mL via INTRAVENOUS

## 2020-11-25 MED ORDER — LIDOCAINE HCL (PF) 1 % IJ SOLN
INTRAMUSCULAR | Status: AC
  Administered 2020-11-25: 10 mL
  Filled 2020-11-25: qty 30

## 2020-11-25 MED ORDER — MIDAZOLAM HCL 2 MG/2ML IJ SOLN
INTRAMUSCULAR | Status: AC
  Filled 2020-11-25: qty 2

## 2020-11-25 MED ORDER — FENTANYL CITRATE (PF) 100 MCG/2ML IJ SOLN
INTRAMUSCULAR | Status: DC | PRN
  Administered 2020-11-25 (×2): 50 ug via INTRAVENOUS

## 2020-11-25 MED ORDER — MIDAZOLAM HCL 2 MG/2ML IJ SOLN
INTRAMUSCULAR | Status: DC | PRN
  Administered 2020-11-25 (×2): 1 mg via INTRAVENOUS

## 2020-11-25 MED ORDER — FENTANYL CITRATE (PF) 100 MCG/2ML IJ SOLN
INTRAMUSCULAR | Status: AC
  Filled 2020-11-25: qty 2

## 2020-11-25 NOTE — Discharge Instructions (Signed)
Inferior Vena Cava Filter Insertion, Care After This sheet gives you information about how to care for yourself after your procedure. Your health care provider may also give you more specific instructions. If you have problems or questions, contact your health care provider. What can I expect after the procedure? After the procedure, it is common to have: Mild pain in the area where the long, thin tube (catheter) used to deliver the filter was inserted. Mild bruising in the area where the catheter used to deliver the filter was inserted. Follow these instructions at home: Insertion site care Follow instructions from your health care provider about how to take care of your catheter insertion site. Make sure you: Wash your hands with soap and water for at least 20 seconds before and after you change your bandage (dressing). If soap and water are not available, use hand sanitizer. Change your dressing as told by your health care provider. Keep the dressing and the insertion site clean and dry. Check your insertion site every day for signs of infection. Check for: More redness, swelling, or pain. Fluid or blood. Warmth. Pus or a bad smell. Do not take baths, swim, or use a hot tub until your health care provider approves. Ask your health care provider if you may take showers. Activity Avoid strenuous exercise or activities that take a lot of effort for 48 hours after the procedure or as told by your health care provider. Do not go back to school or work until your health care provider approves. Do not lift anything that is heavier than 5 lb (2.3 kg), or the limit that you are told, until your health care provider says that it is safe. Driving If you were given a sedative during the procedure, it can affect you for several hours. Do not drive or operate machinery until your health care provider says that it is safe. Ask your health care provider if the medicine prescribed to you requires you to  avoid driving or using heavy machinery. General instructions Take over-the-counter and prescription medicines only as told by your health care provider. Wear compression stockings as told by your health care provider. These stockings help to prevent blood clots and reduce swelling in your legs. Keep all follow-up visits as told by your health care provider. This is important. Contact a health care provider if: You have any of these signs of infection: More redness, swelling, or pain around your catheter insertion site. Fluid or blood coming from your insertion site. Warmth coming from your insertion site. Pus or a bad smell coming from your insertion site. A fever. You are dizzy. You have nausea and vomiting. You develop a rash. Get help right away if: You have chest pain, a cough, or difficulty breathing. You have shortness of breath, feel faint, or pass out. You cough up blood. You have severe pain in your abdomen. You develop swelling and discoloration or pain in your legs. Your legs become pale and cold or blue. You have weakness, difficulty moving your arms or legs, or balance problems. You develop problems with speech or vision. These symptoms may represent a serious problem that is an emergency. Do not wait to see if the symptoms will go away. Get medical help right away. Call your local emergency services (911 in the [place] Do not drive yourself to the hospital. Summary After the procedure, it is common to have mild pain and bruising where a catheter was inserted at your neck or groin (catheter insertion site). Do  not shower, bathe, use a hot tub, or let the dressing get wet until your health care provider approves. Every day, check for signs of infection at the catheter insertion site. This information is not intended to replace advice given to you by your health care provider. Make sure you discuss any questions you have with your health care provider. Document Revised:  02/05/2019 Document Reviewed: 02/05/2019 Elsevier Patient Education  2022 [employer].

## 2020-11-25 NOTE — Consult Note (Signed)
[facility] New Psychiatric Evaluation   Service Date: November 25, 2020 LOS:  LOS: 8 days   Assessment  Derek [name] is a 75 y.o. male admitted medically for 11/17/2020  3:38 AM for an overdose on approximately 800 mg of dilaudid. He has no psychiatric diagnoses and has an extensive past medical history pertinent for chronic pain and recent DVT; see PMH below which I have reviewed. Psychiatry was consulted for suicide attempt by Derek [name] and Derek [name] (2 orders placed).    His current presentation of a serious suicide attempt requiring ICU stay and narcan infusion is most consistent with MDD/severe; unclear whether single untreated episode or recurrent. He has no medications and has not seen a psychiatrist.  He does meet criteria for MDD (anhedonia, sleep, energy most significant on exam). 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 On initial examination, patient stated that he regretted his attempt and is "glad" things worked out the way they did. He is embarrassedly about some of what we discussed and ask that I attempt to block his  notes (option not enabled for my account); have removed some sensitive information not necessary for clinical decision making. We discussed inpatient psychiatric hospitalization which he is considering. Did not discuss IVC. Please see plan below for detailed recommendations.   10/6: Hospital course has been complicated by multiple episodes of agitation (typically after BZD). Has not been accepted to inpt gero psych. Personality structure, frustration tolerance, age, chronic pain, remain significant risk factors. Today focus was to get IVC filter to allow for IV ketamine pain treatments (which would address both pain, depression, and some early literature suggesting related compounds directly address suicidality)  Diagnoses:  Active Hospital problems: Active Problems:   Obstructive  sleep apnea   Poorly controlled type 2 diabetes mellitus (HCC)   Benign essential HTN   Protein-calorie malnutrition, moderate (HCC)   Deep venous thrombosis (HCC)   Overdose, intentional self-harm, initial encounter (Derek [name])   Intentional opiate overdose (Derek [name])   Chronic back pain   Constipation due to opioid therapy    Problems edited/added by me: No problems updated.  Plan  ## Safety and Observation Level:  - Based on my clinical evaluation, I estimate the patient to be at high risk of self harm in the current setting - At this time, we recommend a 1:1 sitter level of observation. This decision is based on my review of the chart including patient's history and current presentation, interview of the patient, mental status examination, and consideration of suicide risk including evaluating suicidal ideation, plan, intent, suicidal or self-harm behaviors, risk factors, and protective factors. This judgment is based on our ability to directly address suicide risk, implement suicide prevention strategies and develop a safety plan while the patient is in the clinical setting. Please contact our team if there is a concern that risk level has changed.   ## Medications:  -- quetiapine 25 mg QHS (agitation/insomnia)  -- for current episode of agitation: if 5 mg haldol ineffective, give 5 mg olanzapine IM and get f/u EKG.   ## Medical Decision Making Capacity:  Not formally assessed at this time  ## Further Work-up:  -- B12/TSH wnl   ## Disposition:  -- psychiatric hospitalization  ## Behavioral / Environmental:  -- delirium precautions  ##Legal Status -- under IVC  Thank you for this consult request. Recommendations have been communicated to the primary team.  We will contiue  to follow at this time.   [place] A Brogen Duell    NEW  Relevant Aspects of Hospital Course:  Admitted on 11/17/2020 for intentional OD on 800 mg dilaudid; required ICU admission and narcan infusion which is  being tapered off. Has since tolerated narcan taper. Had IVC placed today after I saw him - significant behavioral disturbance  Patient Report:  Saw in AM prior to behavioral episode. He was quite irritable; threatened to get a go pro to record all interactions with staff. Attempted to discuss IVC/psych hospital process with little success. Told conflicting stories on original overdose ("I got my wife right away", "I woke up and the pills did nothing so I got my wife"). Attempted to engage in discussion around antidepressant which he was not open to. Discussed events from a few days ago - pt stated "the hospital owns that, not me. It's in my chart that those medications (benzodiazepines) make me go nuts"  No SI, HI, AH/VH endorsed.   Collateral Wife has noticed a lot of deterioration in his ability to cope over the last 6 months, in his activity levels, in a lot of things. She is concerned that he has had a lot of medical issues lately. He has been intermittently threatening suicide throughout their married life but did not threaten before this. She found out because he woke her up. She is not sure why he woke her up. He also has pistols (a lot of pistols which have since been removed). He told her he did not use a pistol because "if I used a pistol there is no turning back". Generally confirms ~6 months of    Wife thinks he took more than 100 pills - closer to 140 mg.    Confirms pt has no relationship with his son. His daughter is "just like him" - the argue quite a bit.   ROS:  Generally + for pain   PMH, social, allergies, family, psych history: see original consult note. Reviewed/Updated as appropriate; pt has allergies listed to sertraline and duloxetine.    Current Medications:   Current Facility-Administered Medications:    acetaminophen (TYLENOL) tablet 650 mg, 650 mg, Oral, Q6H PRN, Derek Simmonds, MD, 650 mg at 11/24/20 1122   apixaban (ELIQUIS) tablet 5 mg, 5 mg, Oral, BID,  Derek Hy, DO, 5 mg at 11/24/20 2104   bisacodyl (DULCOLAX) EC tablet 5 mg, 5 mg, Oral, Daily PRN, Derek Haff, MD   calcium carbonate (TUMS - dosed in mg elemental calcium) chewable tablet 200 mg of elemental calcium, 1 tablet, Oral, BID PRN, Derek [name] P, DO, 200 mg of elemental calcium at 11/18/20 1617   carvedilol (COREG) tablet 6.25 mg, 6.25 mg, Oral, QHS, Derek Haff, MD, 6.25 mg at 11/24/20 2105   fentaNYL (SUBLIMAZE) injection, , , PRN, Criselda Peaches, MD, 50 mcg at 11/25/20 1301   furosemide (LASIX) tablet 20 mg, 20 mg, Oral, Daily PRN, Derek [name] P, DO   glucagon (human recombinant) (GLUCAGEN) injection 1 mg, 1 mg, Intravenous, Once PRN, Derek [name] P, DO   haloperidol lactate (HALDOL) injection 5 mg, 5 mg, Intramuscular, Q6H PRN, Bonnell Public Tublu, MD, 5 mg at 11/25/20 1705   hydrOXYzine (ATARAX/VISTARIL) tablet 10 mg, 10 mg, Oral, TID PRN, Derek [name] P, DO, 10 mg at 11/22/20 1624   insulin aspart (novoLOG) injection 0-20 Units, 0-20 Units, Subcutaneous, TID WC, Derek Haff, MD, 5 Units at 11/25/20 6606   insulin aspart (novoLOG) injection 0-5 Units, 0-5 Units, Subcutaneous,  Paula Compton, MD, 4 Units at 11/24/20 2103   insulin aspart (novoLOG) injection 4 Units, 4 Units, Subcutaneous, TID WC, Derek Haff, MD, 4 Units at 11/24/20 1714   insulin glargine-yfgn (SEMGLEE) injection 14 Units, 14 Units, Subcutaneous, Daily, [place] Pink, Trenton Gammon, MD   lidocaine (LIDODERM) 5 % 1 patch, 1 patch, Transdermal, Q24H, Blount, Scarlette Shorts T, NP, 1 patch at 11/25/20 0109   losartan (COZAAR) tablet 25 mg, 25 mg, Oral, Daily, Derek Haff, MD, 25 mg at 11/25/20 3235   magnesium oxide (MAG-OX) tablet 800 mg, 800 mg, Oral, QHS, Chotiner, Yevonne Aline, MD, 800 mg at 11/24/20 2105   MEDLINE mouth rinse, 15 mL, Mouth Rinse, BID, Derek [name] P, DO, 15 mL at 11/25/20 5732   melatonin tablet 3 mg, 3 mg, Oral, q1800, Averi Cacioppo A, 3 mg at 11/24/20 1714    methadone (DOLOPHINE) tablet 5 mg, 5 mg, Oral, TID, Derek Haff, MD, 5 mg at 11/25/20 2025   midazolam (VERSED) injection, , , PRN, Criselda Peaches, MD, 1 mg at 11/25/20 1301   pantoprazole (PROTONIX) EC tablet 20 mg, 20 mg, Oral, Daily, Derek [name] P, DO, 20 mg at 11/24/20 1123   polyethylene glycol (MIRALAX / GLYCOLAX) packet 17 g, 17 g, Oral, TID, Derek Haff, MD   pravastatin (PRAVACHOL) tablet 20 mg, 20 mg, Oral, Once per day on Mon Thu, [name], Laura P, DO, 20 mg at 11/18/20 1000   pregabalin (LYRICA) capsule 25 mg, 25 mg, Oral, QHS, Bowser, Laurel Dimmer, NP, 25 mg at 11/24/20 2104   promethazine (PHENERGAN) 6.25 mg in sodium chloride 0.9 % 50 mL IVPB, 6.25 mg, Intravenous, Q6H PRN, Derek Hy, DO, Stopped at 11/17/20 1007   QUEtiapine (SEROQUEL) tablet 25 mg, 25 mg, Oral, QHS, Starkes-Perry, Gayland Curry, FNP, 25 mg at 11/24/20 2105   senna-docusate (Senokot-S) tablet 2 tablet, 2 tablet, Oral, BID, Derek Haff, MD, 2 tablet at 11/21/20 2125   Theraworx Relief LIQD 1 application, 1 application, Apply externally, Daily PRN, Bowser, Laurel Dimmer, NP      Objective  Vital signs:  Temp:  [98.8 F (37.1 C)-99.2 F (37.3 C)] 98.8 F (37.1 C) (10/06 0513) Pulse Rate:  [33-134] 41 (10/06 1305) Resp:  [9-21] 21 (10/06 1310) BP: (130-154)/(67-90) 153/85 (10/06 1310) SpO2:  [69 %-100 %] 81 % (10/06 1305)   Physical Exam: Gen: alert, inattentive Pulm: no increased WOB Skin: no rash, lesions, piloerection noted Neuro: CNII-CXII grossly intact   Mental Status Exam: Appearance: Lying in bed, ill appearing, hospital attire  Attitude:  Distractable, occasionally snaps  Behavior/Psychomotor: Normal speed and amount of gesturing  Speech/Language:  Tone, volume, spontaneity, prosody wnl  Mood: Angry/upset   Affect: Full range, angers easily  Thought process: Goal directed, some loosening of associations  Thought content:   Devoid of current suicidal or homicidal content, less  concerned/paranoid about privacy than first evals  Perceptual disturbances:  No ah/vh or illusions noted  Attention: Was able to attend to conversation through multiple interuptions. Did not participate in formal attention testing  Concentration: Some difficulty concentrating to conversation at hand  Orientation: Oriented to self, situation,   Memory: Recent/remote appeared to be intact  Fund of knowledge:  Not formally assessed  Insight:   poor  Judgment:  Very poor  Impulse Control: Very poor     Data Reviewed: PHQ2 did not screen + in 11/21

## 2020-11-25 NOTE — TOC Progression Note (Signed)
Transition of Care [facility]) - Progression Note    Patient Details  Name: BALEN WOOLUM MRN: [id number] Date of Birth: 1945/03/19  Transition of [facility]) CM/SW [place], [place] Phone Number: 11/25/2020, 3:28 PM  Clinical Narrative:  Filter now in place.  FAXed information out to [facility], [place] and [facility]. TOC will continue to follow during the course of hospitalization.      Expected Discharge Plan: Psychiatric Hospital Barriers to Discharge: Other (must enter comment) [facility] pending bed offer)  Expected Discharge Plan and Services Expected Discharge Plan: [facility]   Discharge Planning Services: CM Consult   Living arrangements for the past 2 months: Single Family Home                                       Social Determinants of Health (SDOH) Interventions    Readmission Risk Interventions No flowsheet data found.

## 2020-11-25 NOTE — Progress Notes (Signed)
While patient's nurse was at lunch, patient noted to be becoming increasingly more vocal, stating "I'm leaving.  Y'all can't keep me here."  This charge nurse explained that he is involuntarily committed.   Patient's behavior further continued to escalate.  Security called.  Department leadership aware.  Patient verbally threatening security and nursing staff.  PRN given.  MD notified.  Non-violent restraint order obtained.  Non-violent restraints applied.  Patient's nurse made aware.  AC made aware.   Virginia Rochester, RN

## 2020-11-25 NOTE — Progress Notes (Signed)
Patient refused CBG and all medications.

## 2020-11-25 NOTE — Progress Notes (Addendum)
Patient offered food and fluid. Patient refused food,fluid taken.

## 2020-11-25 NOTE — Progress Notes (Signed)
Alerted by staff that pts wife upset and requesting to speak with leadership.  This Probation officer arrived on the unit to find the wife tearful at the nursing station. Wife expressed concern regarding pts continued disruptive behavior and why is he not being transferred to an inpatient facility.  Expressed to wife that providers are actively working to get placement, however, there are limited inpatient psych beds available.  Wife states he is making her very upset and trying to leave. Leadership encouraged the wife to go home because her presence seems to be escalating the patients behavior. Wife agreed and left the unit.

## 2020-11-25 NOTE — Procedures (Signed)
Interventional Radiology Procedure Note  Procedure: Successful placement of IVC filter  Complications: None  Estimated Blood Loss: None  Recommendations: - Return to room - Bedrest with HOB elevated 30 degrees for 2 hrs  Signed,  Criselda Peaches, MD

## 2020-11-25 NOTE — Progress Notes (Signed)
Physical Therapy Discharge Patient Details Name: Derek [name] MRN: [id number] DOB: 10/06/1945 Today's Date: 11/25/2020 Time:  -     Patient discharged from PT services secondary to  patient is ambulatory, has 1:1 sitters.  .  Please see latest therapy progress note for current level of functioning and progress toward goals.      GP     [name] 11/25/2020, 9:05 AM   [place] Pager [phone] Office [phone]

## 2020-11-25 NOTE — Progress Notes (Signed)
PROGRESS NOTE    Derek [name]  JGG:[id number]  DOB: 1945-09-13  DOA: 11/17/2020 PCP: [name], MD Outpatient Specialists:   Hospital course:  75 year old man with chronic pain secondary to pancreatic cancer s/p pancreatectomy/splenectomy, H/O DVT on Eliquis and factor V Leiden had been doing well on ketamine as an outpatient until it was discontinued secondary to DVT.  He was admitted here 11/17/2020 with intentional overdose of opioids and was treated with Narcan drip and seen by psychiatry.  In house patient's course has been complicated by episodes of agitation and aggression requiring intermittent restraints.  He continues to be followed by psychiatry and palliative care.  Plan is for placement of IVC filter so he could be restarted on ketamine.  He is waiting for a inpatient Gero-psychiatric bed after IVC filter was placed  Subjective:  When I saw the patient he was sitting on the side of his bed chatting amiably with his body "who has known me all my life".  Patient denies feeling confused.  He felt that his pain was "never controlled".  However he appeared comfortable and stable.  Michela Pitcher he was looking forward to IVC placement so he could get started back on his ketamine.  I was subsequently contacted that patient became agitated and aggressive and required security.  Patient was evaluated and placed on four-point restraints again.   Objective: Vitals:   11/25/20 1303 11/25/20 1304 11/25/20 1305 11/25/20 1310  BP:   (!) 151/82 (!) 153/85  Pulse:  (!) 33 (!) 41   Resp: 14 14 17  (!) 21  Temp:      TempSrc:      SpO2:  (!) 71% (!) 81%    No intake or output data in the 24 hours ending 11/25/20 1537 There were no vitals filed for this visit.   Exam:  General: Calm well-appearing man sitting on side of bed chatting amiably with his friend Eyes: sclera anicteric, conjuctiva mild injection bilaterally CVS: S1-S2, regular  Respiratory:  decreased air entry  bilaterally secondary to decreased inspiratory effort, rales at bases  GI: NABS, soft, NT  LE: No edema.  Neuro: A/O x 3, Moving all extremities equally with normal strength, CN 3-12 intact, grossly nonfocal.  Psych: patient is logical and coherent, jmood and affect seemed appropriate to situation.   Assessment & Plan:   Intractable pain secondary to chronic back pain/spinal stenosis Apparently only ketamine seems to manage his pain Plan is for IVC filter placed and reinitiation of ketamine after that Is being followed by palliative care for pain management Started on methadone No further intervention necessary for spinal stenosis per neurosurgery Dr. Trenton Gammon  Depression/agitation/aggression/labile mood Being followed by psychiatry Was involuntarily committed on 11/20/2020 as patient was threatening to leave Awaiting Geri psychiatric bed after IVC placement Patient needs intermittent physical restraints due to labile aggression  Constipation Continue aggressive bowel regimen  UTI Patient declined ceftriaxone  DM2 Sugars are not under excellent control but are not dangerously high in the low 200s Patient has been seen by diabetes coordinator was started on glargine which she is tolerating.  In the past he has refused long-acting insulin  Atrial fibrillation Carvedilol for rate control On Eliquis  DVT/Hypercoagulable state/Factor V Leiden Continue Eliquis  Hypercalcemia Longstanding, asymptomatic, possibly mild hyperparathyroidism   DVT prophylaxis: Eliquis Code Status: Full Family Communication: None Disposition Plan:   Patient is from: Home  Anticipated Discharge Location: Inpatient geriatric psychiatry bed  Barriers to Discharge: Awaiting IVC filter placement  Is patient medically stable for Discharge: He will be after IVC filter placement   Consultants: Psychiatry Antimicrobials: None   Data Reviewed:  Basic Metabolic Panel: Recent Labs  Lab  11/20/20 0538 11/25/20 0511  NA 136 132*  K 4.5 4.0  CL 104 106  CO2 25 20*  GLUCOSE 182* 229*  BUN 25* 25*  CREATININE 0.87 1.02  CALCIUM 11.2* 10.6*   Liver Function Tests: Recent Labs  Lab 11/20/20 0538  AST 18  ALT 22  ALKPHOS 65  BILITOT 0.4  PROT 6.6  ALBUMIN 3.2*   No results for input(s): LIPASE, AMYLASE in the last 168 hours. No results for input(s): AMMONIA in the last 168 hours. CBC: Recent Labs  Lab 11/20/20 0538 11/25/20 0511  WBC 7.6 7.5  NEUTROABS  --  2.8  HGB 11.6* 11.7*  HCT 34.6* 34.8*  MCV 90.6 90.2  PLT 402* 438*   Cardiac Enzymes: No results for input(s): CKTOTAL, CKMB, CKMBINDEX, TROPONINI in the last 168 hours. BNP (last 3 results) No results for input(s): PROBNP in the last 8760 hours. CBG: Recent Labs  Lab 11/23/20 2007 11/24/20 1036 11/24/20 1643 11/24/20 2059 11/25/20 0808  GLUCAP 175* 302* 227* 321* 212*    Recent Results (from the past 240 hour(s))  Resp Panel by RT-PCR (Flu A&B, Covid) Nasopharyngeal Swab     Status: None   Collection Time: 11/17/20  4:59 AM   Specimen: Nasopharyngeal Swab; Nasopharyngeal(NP) swabs in vial transport medium  Result Value Ref Range Status   SARS Coronavirus 2 by RT PCR NEGATIVE NEGATIVE Final    Comment: (NOTE) SARS-CoV-2 target nucleic acids are NOT DETECTED.  The SARS-CoV-2 RNA is generally detectable in upper respiratory specimens during the acute phase of infection. The lowest concentration of SARS-CoV-2 viral copies this assay can detect is 138 copies/mL. A negative result does not preclude SARS-Cov-2 infection and should not be used as the sole basis for treatment or other patient management decisions. A negative result may occur with  improper specimen collection/handling, submission of specimen other than nasopharyngeal swab, presence of viral mutation(s) within the areas targeted by this assay, and inadequate number of viral copies(<138 copies/mL). A negative result must be  combined with clinical observations, patient history, and epidemiological information. The expected result is Negative.  Fact Sheet for Patients:  EntrepreneurPulse.com.au  Fact Sheet for Healthcare Providers:  IncredibleEmployment.be  This test is no t yet approved or cleared by the [place] FDA and  has been authorized for detection and/or diagnosis of SARS-CoV-2 by FDA under an Emergency Use Authorization (EUA). This EUA will remain  in effect (meaning this test can be used) for the duration of the COVID-19 declaration under Section 564(b)(1) of the Act, 21 U.S.C.section 360bbb-3(b)(1), unless the authorization is terminated  or revoked sooner.       Influenza A by PCR NEGATIVE NEGATIVE Final   Influenza B by PCR NEGATIVE NEGATIVE Final    Comment: (NOTE) The Xpert Xpress SARS-CoV-2/FLU/RSV plus assay is intended as an aid in the diagnosis of influenza from Nasopharyngeal swab specimens and should not be used as a sole basis for treatment. Nasal washings and aspirates are unacceptable for Xpert Xpress SARS-CoV-2/FLU/RSV testing.  Fact Sheet for Patients: EntrepreneurPulse.com.au  Fact Sheet for Healthcare Providers: IncredibleEmployment.be  This test is not yet approved or cleared by the [place] FDA and has been authorized for detection and/or diagnosis of SARS-CoV-2 by FDA under an Emergency Use Authorization (EUA). This EUA will remain in effect (meaning  this test can be used) for the duration of the COVID-19 declaration under Section 564(b)(1) of the Act, 21 U.S.C. section 360bbb-3(b)(1), unless the authorization is terminated or revoked.  Performed at [facility], [place] [address] [place], [place] [address]   Urine Culture     Status: None   Collection Time: 11/17/20  9:14 AM   Specimen: Urine, Clean Catch  Result Value Ref Range Status   Specimen Description    Final    URINE, CLEAN CATCH Performed at [facility], [place] [address], [place], [place] [address]    Special Requests   Final    Normal Performed at [facility], [place] [address], [place], [place] [address]    Culture   Final    NO GROWTH Performed at [facility], [place] [address], [place], [place] [address]    Report Status 11/19/2020 FINAL  Final  MRSA Next Gen by PCR, Nasal     Status: None   Collection Time: 11/17/20  9:27 AM   Specimen: Nasal Mucosa; Nasal Swab  Result Value Ref Range Status   MRSA by PCR Next Gen NOT DETECTED NOT DETECTED Final    Comment: (NOTE) The GeneXpert MRSA Assay (FDA approved for NASAL specimens only), is one component of a comprehensive MRSA colonization surveillance program. It is not intended to diagnose MRSA infection nor to guide or monitor treatment for MRSA infections. Test performance is not FDA approved in patients less than 16 years old. 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 Peripheral IV access could not be obtained. Therefore, ultrasound-guided deep vein IV placement was performed by the MD. EXAM: ULTRASOUND GUIDANCE FOR [place] IVC CATHETERIZATION AND VENOGRAM IVC FILTER INSERTION Interventional Radiologist:  Criselda Peaches, MD MEDICATIONS: None. ANESTHESIA/SEDATION: Fentanyl 100 mcg IV; Versed 2 mg IV Moderate Sedation Time:  23  minutes The patient was continuously monitored during the procedure by the interventional radiology nurse under my direct supervision. FLUOROSCOPY TIME:  Fluoroscopy Time: 1 minutes 48 seconds (228 mGy). COMPLICATIONS: None immediate. PROCEDURE: Informed written consent was obtained from the patient after a thorough discussion of the procedural risks, benefits and alternatives. All questions were addressed. Maximal Sterile Barrier Technique was utilized including caps, mask, sterile gowns, sterile gloves, sterile drape, hand hygiene and skin antiseptic. A timeout was performed prior to the initiation of the procedure. The right brachial vein was interrogated with ultrasound and found to be widely patent. An image was obtained and stored for the medical record. Local anesthesia was attained by infiltration with 1% lidocaine. A small dermatotomy was made. Under real-time sonographic guidance, the vessel was punctured with a 21 gauge micropuncture needle. Using standard technique, the initial micro needle was exchanged over a 0.018 micro wire for a transitional 4 [place] micro sheath. The micro sheath was then converted into an IV by adding a valved capped. The IV flushes and aspirates easily. The IV was secured to  the skin in standard fashion. Maximal barrier sterile technique utilized including caps, mask, sterile gowns, sterile gloves, large sterile drape, hand hygiene, and Betadine prep. Under sterile condition and local anesthesia, right internal jugular venous access was performed with ultrasound. An ultrasound image was saved and sent to PACS. Over a guidewire, the IVC filter delivery sheath and inner dilator were advanced into the IVC just above the IVC bifurcation. Contrast injection was performed for an IVC venogram. Through the delivery sheath, a retrievable Denali IVC filter was deployed below the level of the renal veins and above the IVC bifurcation. Limited post deployment venacavagram was performed. The  delivery sheath was removed and hemostasis was obtained with manual compression. A dressing was placed. The patient tolerated the procedure well without immediate post procedural complication. FINDINGS: The IVC is patent. No evidence of thrombus, stenosis, or occlusion. No variant venous anatomy. Successful placement of the IVC filter below the level of the renal veins. IMPRESSION: Successful ultrasound and fluoroscopically guided placement of an infrarenal retrievable IVC filter via right jugular approach. PLAN: Due to patient related comorbidities and/or clinical necessity, this IVC filter should be considered a permanent device. This patient will not be actively followed for future filter retrieval. Electronically Signed   By: Jacqulynn Cadet M.D.   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 Peripheral IV access could not be obtained. Therefore, ultrasound-guided deep vein IV placement was performed by the MD. EXAM: ULTRASOUND GUIDANCE FOR [place] IVC CATHETERIZATION AND VENOGRAM IVC FILTER INSERTION Interventional Radiologist:  Criselda Peaches, MD MEDICATIONS: None. ANESTHESIA/SEDATION: Fentanyl 100 mcg IV; Versed 2 mg IV Moderate Sedation Time:  23 minutes The patient was continuously monitored during the procedure by the interventional radiology nurse under my direct supervision. FLUOROSCOPY TIME:  Fluoroscopy Time: 1 minutes 48 seconds (228 mGy). COMPLICATIONS: None immediate. PROCEDURE: Informed written consent was obtained from the patient after a thorough discussion of the procedural risks, benefits and alternatives. All questions were addressed. Maximal Sterile Barrier Technique was utilized including caps, mask, sterile gowns, sterile gloves, sterile drape, hand hygiene and skin antiseptic. A timeout was performed prior to the initiation of the procedure. The right  brachial vein was interrogated with ultrasound and found to be widely patent. An image was obtained and stored for the medical record. Local anesthesia was attained by infiltration with 1% lidocaine. A small dermatotomy was made. Under real-time sonographic guidance, the vessel was punctured with a 21 gauge micropuncture needle. Using standard technique, the initial micro needle was exchanged over a 0.018 micro wire for a transitional 4 [place] micro sheath. The micro sheath was then converted into an IV by adding a valved capped. The IV flushes and aspirates easily. The IV was secured to the skin in standard fashion. Maximal barrier sterile technique utilized including caps, mask, sterile gowns, sterile gloves, large sterile drape, hand hygiene, and Betadine prep. Under sterile condition and local anesthesia, right internal jugular venous access was performed with ultrasound. An ultrasound image was saved and sent to PACS. Over a guidewire, the IVC filter delivery sheath and inner dilator were advanced into the IVC just above the IVC bifurcation. Contrast injection was performed for an IVC venogram. Through the delivery sheath, a retrievable Denali IVC filter was deployed below the level of the renal veins and above the IVC bifurcation. Limited post deployment venacavagram was performed. The delivery sheath was removed and hemostasis was obtained with  manual compression. A dressing was placed. The patient tolerated the procedure well without immediate post procedural complication. FINDINGS: The IVC is patent. No evidence of thrombus, stenosis, or occlusion. No variant venous anatomy. Successful placement of the IVC filter below the level of the renal veins. IMPRESSION: Successful ultrasound and fluoroscopically guided placement of an infrarenal retrievable IVC filter via right jugular approach. PLAN: Due to patient related comorbidities and/or clinical necessity, this IVC filter should be considered a permanent  device. This patient will not be actively followed for future filter retrieval. Electronically Signed   By: Jacqulynn Cadet M.D.   On: 11/25/2020 13:51     Scheduled Meds:  apixaban  5 mg Oral BID   carvedilol  6.25 mg Oral QHS   insulin aspart  0-20 Units Subcutaneous TID WC   insulin aspart  0-5 Units Subcutaneous QHS   insulin aspart  4 Units Subcutaneous TID WC   insulin glargine-yfgn  14 Units Subcutaneous Daily   lidocaine  1 patch Transdermal Q24H   losartan  25 mg Oral Daily   magnesium oxide  800 mg Oral QHS   mouth rinse  15 mL Mouth Rinse BID   melatonin  3 mg Oral q1800   methadone  5 mg Oral TID   pantoprazole  20 mg Oral Daily   polyethylene glycol  17 g Oral TID   pravastatin  20 mg Oral Once per day on Mon Thu   pregabalin  25 mg Oral QHS   QUEtiapine  25 mg Oral QHS   senna-docusate  2 tablet Oral BID   Continuous Infusions:  promethazine (PHENERGAN) injection (IM or IVPB) Stopped (11/17/20 1007)    Active Problems:   Obstructive sleep apnea   Poorly controlled type 2 diabetes mellitus ([place])   Benign essential HTN   Protein-calorie malnutrition, moderate (HCC)   Deep venous thrombosis (HCC)   Overdose, intentional self-harm, initial encounter ([place])   Intentional opiate overdose ([place])   Chronic back pain   Constipation due to opioid therapy     Avigdor Dollar Derek Jack, [facility]  If 7PM-7AM, please contact night-coverage [email or web address]   LOS: 8 days

## 2020-11-26 DIAGNOSIS — I82402 Acute embolism and thrombosis of unspecified deep veins of left lower extremity: Secondary | ICD-10-CM

## 2020-11-26 DIAGNOSIS — M5440 Lumbago with sciatica, unspecified side: Secondary | ICD-10-CM

## 2020-11-26 DIAGNOSIS — T50902A Poisoning by unspecified drugs, medicaments and biological substances, intentional self-harm, initial encounter: Secondary | ICD-10-CM | POA: Diagnosis not present

## 2020-11-26 DIAGNOSIS — T40602D Poisoning by unspecified narcotics, intentional self-harm, subsequent encounter: Secondary | ICD-10-CM

## 2020-11-26 DIAGNOSIS — F322 Major depressive disorder, single episode, severe without psychotic features: Secondary | ICD-10-CM | POA: Diagnosis not present

## 2020-11-26 LAB — GLUCOSE, CAPILLARY
Glucose-Capillary: 218 mg/dL — ABNORMAL HIGH (ref 70–99)
Glucose-Capillary: 259 mg/dL — ABNORMAL HIGH (ref 70–99)
Glucose-Capillary: 388 mg/dL — ABNORMAL HIGH (ref 70–99)
Glucose-Capillary: 304 mg/dL — ABNORMAL HIGH (ref 70–99)

## 2020-11-26 MED ORDER — GUAIFENESIN-DM 100-10 MG/5ML PO SYRP: 5.0000 mL | ORAL_SOLUTION | ORAL | Status: DC | PRN

## 2020-11-26 MED ORDER — GUAIFENESIN 100 MG/5ML PO SOLN
5.0000 mL | Freq: Once | ORAL | Status: AC
  Administered 2020-11-26: 100 mg via ORAL
  Filled 2020-11-26: qty 10

## 2020-11-26 NOTE — TOC Progression Note (Signed)
Transition of Care [facility]) - Progression Note    Patient Details  Name: Derek [name] MRN: [id number] Date of Birth: [birth year]  [facility] CM/SW [place], [place] Phone Number: 11/26/2020, 8:31 AM  Clinical Narrative:   Sent updated clinicals to yesterday's facilities. Inquiry sent to [facility] re bed availability. TOC will continue to follow during the course of hospitalization.     Expected Discharge Plan: Psychiatric Hospital Barriers to Discharge: Other (must enter comment) [facility] pending bed offer)  Expected Discharge Plan and Services Expected Discharge Plan: [facility]   Discharge Planning Services: CM Consult   Living arrangements for the past 2 months: Single Family Home                                       Social Determinants of Health (SDOH) Interventions    Readmission Risk Interventions No flowsheet data found.

## 2020-11-26 NOTE — Progress Notes (Signed)
Inpatient Diabetes Program Recommendations  AACE/ADA: New Consensus Statement on Inpatient Glycemic Control (2015)  Target Ranges:  Prepandial:   less than 140 mg/dL      Peak postprandial:   less than 180 mg/dL (1-2 hours)      Critically ill patients:  140 - 180 mg/dL   Lab Results  Component Value Date   GLUCAP 218 (H) 11/26/2020   HGBA1C 10.0 (H) 11/20/2020    Review of Glycemic Control  Diabetes history: DM1 Outpatient Diabetes medications: Novolog 5-30 units TID before meals, Tresiba 14 units QAM (not taking d/t hypoglycemia) Current orders for Inpatient glycemic control:  Semglee 14 units Novolog 0-20 units tid + hs Novolog 4 units tid meal coverage  A1c 10% on 10/1  Inpatient Diabetes Program Recommendations:    -  D/C semglee. Pt is refusing basal insulin while inpatient. He has been counseled several times. He is also choosing how much Novolog to take. Will be extremely difficult to control.  Thanks,  Tama Headings RN, MSN, BC-ADM Inpatient Diabetes Coordinator Team Pager [phone] (8a-5p)

## 2020-11-26 NOTE — Progress Notes (Signed)
Occupational Therapy Discharge Patient Details Name: Derek [name] MRN: [id number] DOB: June 13, 1945 Today's Date: 11/26/2020 Time:  -     Patient discharged from OT services secondary to patient ambulated to bathroom himself, tore piping out of wall. Plan is to find psychiatric placement, no acute OT needs.   Please see latest therapy progress note for current level of functioning and progress toward goals.    Delbert Phenix OT OT pager: [phone] 11/26/2020, 6:20 AM

## 2020-11-26 NOTE — Care Management Important Message (Signed)
Important Message  Patient Details IM Letter given to the Patient. Name: Derek [name] MRN: [id number] Date of Birth: [birth year]   Medicare Important Message Given:  Yes     Kerin Salen 11/26/2020, 10:35 AM

## 2020-11-26 NOTE — Progress Notes (Signed)
Patient refused insulin, was able to obtain CBG. Patient offered breakfast, patient refused.

## 2020-11-26 NOTE — Consult Note (Signed)
[facility] New Psychiatric Evaluation   Service Date: November 26, 2020 LOS:  LOS: 9 days   Assessment  Derek [name] is a 75 y.o. male admitted medically for 11/17/2020  3:38 AM for an overdose on approximately 800 mg of dilaudid. He has no psychiatric diagnoses and has an extensive past medical history pertinent for chronic pain and recent DVT; see PMH below which I have reviewed. Psychiatry was consulted for suicide attempt by [name] and [name]    His initial presentation of a serious suicide attempt requiring ICU stay and narcan infusion is most consistent with MDD/severe; unclear whether single untreated episode or recurrent. He has no medications and has not seen a psychiatrist.  He does meet criteria for MDD (anhedonia, sleep, energy most significant on exam). 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 On initial examination, patient stated that he regretted his attempt and is "glad" things worked out the way they did. He is embarrassedly about some of what we discussed and ask that I attempt to block his  notes (option not enabled for my account); have removed some sensitive information not necessary for clinical decision making. We discussed inpatient psychiatric hospitalization which he is considering. Did not discuss IVC. Please see plan below for detailed recommendations.   10/7: Hospital course has been complicated by multiple episodes of agitation (typically after BZD). Has not been accepted to inpt gero psych. Has been having episodes of agitation usually after BZD administration (prior to episode yesterday he got midazolam for the IVC filter placement).   Personality structure, frustration tolerance, age, chronic pain, remain significant risk factors.  Diagnoses:  Active Hospital problems: Active Problems:   Obstructive sleep apnea   Poorly controlled type 2 diabetes mellitus (HCC)    Benign essential HTN   Protein-calorie malnutrition, moderate (HCC)   Deep venous thrombosis (HCC)   Overdose, intentional self-harm, initial encounter ([place])   Intentional opiate overdose ([place])   Chronic back pain   Constipation due to opioid therapy    Problems edited/added by me: No problems updated.  Plan  ## Safety and Observation Level:  - Based on my clinical evaluation, I estimate the patient to be at high risk of self harm in the current setting - At this time, we recommend a 1:1 sitter level of observation. This decision is based on my review of the chart including patient's history and current presentation, interview of the patient, mental status examination, and consideration of suicide risk including evaluating suicidal ideation, plan, intent, suicidal or self-harm behaviors, risk factors, and protective factors. This judgment is based on our ability to directly address suicide risk, implement suicide prevention strategies and develop a safety plan while the patient is in the clinical setting. Please contact our team if there is a concern that risk level has changed.   ## Medications:  -- quetiapine 25 mg QHS (agitation/insomnia)  ## Medical Decision Making Capacity:  Not formally assessed at this time  ## Further Work-up:  -- B12/TSH wnl  ## Disposition:  -- psychiatric hospitalization  ## Behavioral / Environmental:  -- delirium precautions  ##Legal Status -- under IVC  Thank you for this consult request. Recommendations have been communicated to the primary team.  We will contiue to follow at this time.   Benkelman A Jasmin Winberry    NEW  Relevant Aspects of Hospital Course:  Admitted on 11/17/2020 for intentional OD on 800 mg dilaudid; required  ICU admission and narcan infusion which is being tapered off. Has since tolerated narcan taper. Had IVC placed today after I saw him - significant behavioral disturbance  Patient Report:  Saw in PM. He requested nurse  Hailei be in room for conversation, seemed suspicious of this author. We discussed his episodes of agitation which he relates to BZD use and takes no ownership over. We discussed current plan for psych hospitalization - pt continues to not be amenable to antidepressant (attempted again to discuss differences between BZD and antidepressant). Continues to deny current SI,  HI, AH/VH.   Collateral Called wife who has major concerns about patient after episode of agitation witnessed yesterday. Shares chronic problems in their marriage, feels she is not the support he needs at this time.   ROS:  Generally + for pain   PMH, social, allergies, family, psych history: see original consult note. Reviewed/Updated as appropriate; pt has allergies listed to sertraline and duloxetine.    Current Medications:   Current Facility-Administered Medications:    acetaminophen (TYLENOL) tablet 650 mg, 650 mg, Oral, Q6H PRN, [name], MD, 650 mg at 11/26/20 1144   apixaban (ELIQUIS) tablet 5 mg, 5 mg, Oral, BID, [name], DO, 5 mg at 11/26/20 1141   bisacodyl (DULCOLAX) EC tablet 5 mg, 5 mg, Oral, Daily PRN, [name], MD   calcium carbonate (TUMS - dosed in mg elemental calcium) chewable tablet 200 mg of elemental calcium, 1 tablet, Oral, BID PRN, [name] P, DO, 200 mg of elemental calcium at 11/18/20 1617   carvedilol (COREG) tablet 6.25 mg, 6.25 mg, Oral, QHS, [name], MD, 6.25 mg at 11/25/20 2100   fentaNYL (SUBLIMAZE) injection, , , PRN, Criselda Peaches, MD, 50 mcg at 11/25/20 1301   furosemide (LASIX) tablet 20 mg, 20 mg, Oral, Daily PRN, [name] P, DO   glucagon (human recombinant) (GLUCAGEN) injection 1 mg, 1 mg, Intravenous, Once PRN, [name] P, DO   haloperidol lactate (HALDOL) injection 5 mg, 5 mg, Intramuscular, Q6H PRN, Bonnell Public Tublu, MD, 5 mg at 11/25/20 1705   hydrOXYzine (ATARAX/VISTARIL) tablet 10 mg, 10 mg, Oral, TID PRN, [name] P,  DO, 10 mg at 11/22/20 1624   insulin aspart (novoLOG) injection 0-20 Units, 0-20 Units, Subcutaneous, TID WC, [name], MD, 8 Units at 11/26/20 1141   insulin aspart (novoLOG) injection 0-5 Units, 0-5 Units, Subcutaneous, QHS, [name], MD, 2 Units at 11/25/20 2227   insulin aspart (novoLOG) injection 4 Units, 4 Units, Subcutaneous, TID WC, [name], MD, 4 Units at 11/24/20 1714   insulin glargine-yfgn (SEMGLEE) injection 14 Units, 14 Units, Subcutaneous, Daily, [place] Pink, Sendil K, MD   lidocaine (LIDODERM) 5 % 1 patch, 1 patch, Transdermal, Q24H, Blount, Xenia T, NP, 1 patch at 11/25/20 8786   losartan (COZAAR) tablet 25 mg, 25 mg, Oral, Daily, [name], MD, 25 mg at 11/26/20 1141   magnesium oxide (MAG-OX) tablet 800 mg, 800 mg, Oral, QHS, Chotiner, Yevonne Aline, MD, 800 mg at 11/25/20 2100   MEDLINE mouth rinse, 15 mL, Mouth Rinse, BID, [name], Laura P, DO, 15 mL at 11/26/20 1145   melatonin tablet 3 mg, 3 mg, Oral, q1800, Laylonie Marzec A, 3 mg at 11/24/20 1714   methadone (DOLOPHINE) tablet 5 mg, 5 mg, Oral, TID, [name], MD, 5 mg at 11/26/20 1141   midazolam (VERSED) injection, , , PRN, Criselda Peaches, MD, 1 mg at 11/25/20 1301   pantoprazole (PROTONIX) EC tablet 20  mg, 20 mg, Oral, Daily, [name] P, DO, 20 mg at 11/26/20 1141   polyethylene glycol (MIRALAX / GLYCOLAX) packet 17 g, 17 g, Oral, TID, [name], MD, 17 g at 11/25/20 2100   pravastatin (PRAVACHOL) tablet 20 mg, 20 mg, Oral, Once per day on Mon Thu, [name], Laura P, DO, 20 mg at 11/18/20 1000   pregabalin (LYRICA) capsule 25 mg, 25 mg, Oral, QHS, Bowser, Laurel Dimmer, NP, 25 mg at 11/25/20 2100   promethazine (PHENERGAN) 6.25 mg in sodium chloride 0.9 % 50 mL IVPB, 6.25 mg, Intravenous, Q6H PRN, [name], DO, Stopped at 11/17/20 1007   QUEtiapine (SEROQUEL) tablet 25 mg, 25 mg, Oral, QHS, Starkes-Perry, Takia S, FNP, 25 mg at 11/25/20 2100   senna-docusate (Senokot-S) tablet 2  tablet, 2 tablet, Oral, BID, [name], MD, 2 tablet at 11/25/20 2100   Theraworx Relief LIQD 1 application, 1 application, Apply externally, Daily PRN, Cristal Generous, NP      Objective  Vital signs:  Temp:  [98.2 F (36.8 C)-98.6 F (37 C)] 98.2 F (36.8 C) (10/07 0554) Pulse Rate:  [70-78] 70 (10/07 0554) Resp:  [16-18] 16 (10/07 0554) BP: (138-142)/(71-76) 138/76 (10/07 0554) SpO2:  [96 %-97 %] 97 % (10/07 0554)   Physical Exam: Gen: alert, more attentive Pulm: no increased WOB Skin: no rash, lesions, piloerection noted Neuro: CNII-CXII grossly intact   Mental Status Exam: Appearance: Lying in bed, ill appearing, hospital attire  Attitude:  Distractable, occasionally snaps, suspicious   Behavior/Psychomotor: Normal speed and amount of gesturing  Speech/Language:  Tone, volume, spontaneity, prosody wnl  Mood: Frustrated  Affect: Full range, angers easily  Thought process: Goal directed, some loosening of associations  Thought content:   Devoid of current suicidal or homicidal content, less concerned/paranoid about privacy than first evals  Perceptual disturbances:  No ah/vh or illusions noted  Attention: Did not participate in formal attention testing  Concentration: Some difficulty concentrating to conversation at hand  Orientation: Oriented to self, situation,   Memory: Recent/remote appeared to be intact  Fund of knowledge:  Not formally assessed  Insight:   Very poor  Judgment:  Very poor  Impulse Control: Very poor     Data Reviewed: PHQ2 did not screen + in 11/21

## 2020-11-26 NOTE — Progress Notes (Signed)
Patient refused 24 units of insulin that is scheduled according to sliding scale. Patient states he will take 12 units. 12 units administered.

## 2020-11-26 NOTE — Progress Notes (Signed)
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  Patient is followed by pain management specialist he was said to be receiving outpatient ketamine for pain with significant relief however it was put on hold due to his latest DVT he had been placed on Dilaudid which is not helping his pain as a result he took overdose of 100 tablets ended up in the emergency room admitted ICU and not released to hospitalist service he received on Narcan infusion.  He is currently being seen by psychiatry and palliative care Overnight patient became disoriented confused and very aggressive had to be put on restraints.  Subjective November 26, 2020 Patient seen and examined at bedside he is on tube point soft restraints he wants to get out of the restraints he did not understand why he is on restraints.  He stated that he wanted to go home.  But upon further discussion he stated that his wife has put him out he does not have a place to go now.  Assessment/Plan: Active Problems:   Obstructive sleep apnea   Poorly controlled type 2 diabetes mellitus (HCC)   Benign essential HTN   Protein-calorie malnutrition, moderate (HCC)   Deep venous thrombosis (HCC)   Overdose, intentional self-harm, initial encounter ([place])   Intentional opiate overdose ([place])   Chronic back pain   Constipation due to opioid therapy  #1 DVT/hypercoagulable state/factor V Leyden:Doppler on 11/07/2020 revealed nonocclusive deep venous thrombus in the right common femoral and femoral veins with acute superficial greater saphenous vein thrombosis in the mid to distal thigh.   He was on Eliquis.  But  underwent IVC filter because of his recurrent DVT  2.  Chronic pain with spinal stenosis chronic back pain.  Patient was seen by palliative care to recommend methadone but as outpatient she he was on ketamine which was stopped due to his DVT.  Now he has received IVC filter and the question is whether he needs to continue DVT pain management with ketamine. No further intervention necessary for spinal stenosis per neurosurgery Dr. Trenton Gammon  3.  Overdose which might be unintentional versus intentional patient is IVC need  4.  Chronic atrial fibrillation on Eliquis and Coreg  5.  Constipation due to opioid Continue current bowel regimen  6.  Depression agitation aggressive labile mood Being followed by psychiatry Patient involuntarily committed on November 20, 2020 Possibly transfer to [facility] psychiatric bed  7.  Type 2 diabetes mellitus uncontrolled.  Patient has been refusing point-of-care glucose monitoring  8.  Hypercalciuric: Longstanding asymptomatic hypercalcemia  9.  Mild hyponatremia we will continue to monitor  Code Status: Full  Severity of Illness: The appropriate patient status for this patient is INPATIENT. Inpatient status is judged to be reasonable and necessary in order to provide the required intensity of service to ensure the patient's safety. The patient's presenting symptoms, physical exam findings, and initial radiographic and laboratory data in the context of their chronic comorbidities is felt to place them at high risk for further clinical deterioration. Furthermore, it is not anticipated that the patient will be medically stable for discharge from the hospital within 2 midnights of admission. The following factors support the  patient status of inpatient.   " The patien his IVC, waiting for Geri psych   * I certify that at the point of admission it is my clinical judgment that the patient will require inpatient hospital care spanning beyond 2 midnights from the point of  admission due to high intensity of service, high risk for further deterioration and high frequency of surveillance required.*   Family Communication: None at bedside  Disposition Plan:   Status is: Inpatient   Dispo: The patient is from: Home              Anticipated d/c is to: Possibly Geri psych              Anticipated d/c date is:               Patient currently not medically stable for discharge  Consultants: Palliative care Psychiatry Interventional radiology  Procedures:  RIGHT IVC filter placement 10/6  Antimicrobials: None  DVT prophylaxis: Eliquis   Objective: Vitals:   11/25/20 1305 11/25/20 1310 11/25/20 2106 11/26/20 0554  BP: (!) 151/82 (!) 153/85 (!) 142/71 138/76  Pulse: (!) 41  78 70  Resp: 17 (!) 21 18 16   Temp:   98.6 F (37 C) 98.2 F (36.8 C)  TempSrc:   Oral Oral  SpO2: (!) 81%  96% 97%    Intake/Output Summary (Last 24 hours) at 11/26/2020 0901 Last data filed at 11/25/2020 2143 Gross per 24 hour  Intake 250 ml  Output 160 ml  Net 90 ml   There were no vitals filed for this visit. There is no height or weight on file to calculate BMI.  Exam:  General: 75 y.o. year-old male well developed well nourished in no acute distress.  Alert and oriented x3. Cardiovascular: Regular rate and rhythm with no rubs or gallops.  No thyromegaly or JVD noted.   Respiratory: Clear to auscultation with no wheezes or rales. Good inspiratory effort. Abdomen: Soft nontender nondistended with normal bowel sounds x4 quadrants. Musculoskeletal: No lower extremity edema. 2/4 pulses in all 4 extremities. Skin: No ulcerative lesions noted or rashes, Psychiatry: Mood is appropriate for condition and setting somewhat pleasant at the end of the conversation after we explained to him why he was put on restraint and I agreed to let him get up to use the bathroom Neurology:    Data Reviewed: CBC: Recent Labs  Lab 11/20/20 0538 11/25/20 0511  WBC 7.6 7.5   NEUTROABS  --  2.8  HGB 11.6* 11.7*  HCT 34.6* 34.8*  MCV 90.6 90.2  PLT 402* 025*   Basic Metabolic Panel: Recent Labs  Lab 11/20/20 0538 11/25/20 0511  NA 136 132*  K 4.5 4.0  CL 104 106  CO2 25 20*  GLUCOSE 182* 229*  BUN 25* 25*  CREATININE 0.87 1.02  CALCIUM 11.2* 10.6*   GFR: Estimated Creatinine Clearance: 81.3 mL/min (by C-G formula based on SCr of 1.02 mg/dL). Liver Function Tests: Recent Labs  Lab 11/20/20 0538  AST 18  ALT 22  ALKPHOS 65  BILITOT 0.4  PROT 6.6  ALBUMIN 3.2*   No results for input(s): LIPASE, AMYLASE in the last 168 hours. No results for input(s): AMMONIA in the last 168 hours. Coagulation Profile: Recent Labs  Lab 11/25/20 0511  INR 1.3*   Cardiac Enzymes: No results for input(s): CKTOTAL, CKMB, CKMBINDEX, TROPONINI in the last 168 hours. BNP (last 3 results) No results for input(s): PROBNP in the last 8760 hours. HbA1C:  No results for input(s): HGBA1C in the last 72 hours. CBG: Recent Labs  Lab 11/24/20 1643 11/24/20 2059 11/25/20 0808 11/25/20 2124 11/26/20 0844  GLUCAP 227* 321* 212* 236* 218*   Lipid Profile: No results for input(s): CHOL, HDL, LDLCALC, TRIG, CHOLHDL, LDLDIRECT in the last 72 hours. Thyroid Function Tests: No results for input(s): TSH, T4TOTAL, FREET4, T3FREE, THYROIDAB in the last 72 hours. Anemia Panel: No results for input(s): VITAMINB12, FOLATE, FERRITIN, TIBC, IRON, RETICCTPCT in the last 72 hours. Urine analysis:    Component Value Date/Time   COLORURINE YELLOW (A) 11/17/2020 2230   APPEARANCEUR CLEAR (A) 11/17/2020 2230   LABSPEC 1.020 11/17/2020 2230   PHURINE 6.0 11/17/2020 2230   GLUCOSEU >1,000 (A) 11/17/2020 2230   HGBUR LARGE (A) 11/17/2020 2230   BILIRUBINUR NEGATIVE 11/17/2020 2230   BILIRUBINUR negative 07/04/2011 1902   KETONESUR NEGATIVE 11/17/2020 2230   PROTEINUR 100 (A) 11/17/2020 2230   UROBILINOGEN 0.2 05/22/2013 0924   NITRITE POSITIVE (A) 11/17/2020 2230    LEUKOCYTESUR NEGATIVE 11/17/2020 2230   Sepsis Labs: @LABRCNTIP (procalcitonin:4,lacticidven:4)  ) Recent Results (from the past 240 hour(s))  Resp Panel by RT-PCR (Flu A&B, Covid) Nasopharyngeal Swab     Status: None   Collection Time: 11/17/20  4:59 AM   Specimen: Nasopharyngeal Swab; Nasopharyngeal(NP) swabs in vial transport medium  Result Value Ref Range Status   SARS Coronavirus 2 by RT PCR NEGATIVE NEGATIVE Final    Comment: (NOTE) SARS-CoV-2 target nucleic acids are NOT DETECTED.  The SARS-CoV-2 RNA is generally detectable in upper respiratory specimens during the acute phase of infection. The lowest concentration of SARS-CoV-2 viral copies this assay can detect is 138 copies/mL. A negative result does not preclude SARS-Cov-2 infection and should not be used as the sole basis for treatment or other patient management decisions. A negative result may occur with  improper specimen collection/handling, submission of specimen other than nasopharyngeal swab, presence of viral mutation(s) within the areas targeted by this assay, and inadequate number of viral copies(<138 copies/mL). A negative result must be combined with clinical observations, patient history, and epidemiological information. The expected result is Negative.  Fact Sheet for Patients:  EntrepreneurPulse.com.au  Fact Sheet for Healthcare Providers:  IncredibleEmployment.be  This test is no t yet approved or cleared by the [place] FDA and  has been authorized for detection and/or diagnosis of SARS-CoV-2 by FDA under an Emergency Use Authorization (EUA). This EUA will remain  in effect (meaning this test can be used) for the duration of the COVID-19 declaration under Section 564(b)(1) of the Act, 21 U.S.C.section 360bbb-3(b)(1), unless the authorization is terminated  or revoked sooner.       Influenza A by PCR NEGATIVE NEGATIVE Final   Influenza B by PCR NEGATIVE  NEGATIVE Final    Comment: (NOTE) The Xpert Xpress SARS-CoV-2/FLU/RSV plus assay is intended as an aid in the diagnosis of influenza from Nasopharyngeal swab specimens and should not be used as a sole basis for treatment. Nasal washings and aspirates are unacceptable for Xpert Xpress SARS-CoV-2/FLU/RSV testing.  Fact Sheet for Patients: EntrepreneurPulse.com.au  Fact Sheet for Healthcare Providers: IncredibleEmployment.be  This test is not yet approved or cleared by the [place] FDA and has been authorized for detection and/or diagnosis of SARS-CoV-2 by FDA under an Emergency Use Authorization (EUA). This EUA will remain in effect (meaning this test can be used) for the duration of the COVID-19 declaration under Section 564(b)(1) of the Act, 21 U.S.C. section 360bbb-3(b)(1), unless the authorization is  terminated or revoked.  Performed at [facility], [place] [address], [place], [place] [address]   Urine Culture     Status: None   Collection Time: 11/17/20  9:14 AM   Specimen: Urine, Clean Catch  Result Value Ref Range Status   Specimen Description   Final    URINE, CLEAN CATCH Performed at [facility], [place] [address] [place], [place] [address]    Special Requests   Final    Normal Performed at [facility], [place] [address], [place], [place] [address]    Culture   Final    NO GROWTH Performed at [facility], [place] [address] [place], [place] [address]    Report Status 11/19/2020 FINAL  Final  MRSA Next Gen by PCR, Nasal     Status: None   Collection Time: 11/17/20  9:27 AM   Specimen: Nasal Mucosa; Nasal Swab  Result Value Ref Range Status   MRSA by PCR Next Gen NOT DETECTED NOT DETECTED Final    Comment: (NOTE) The GeneXpert MRSA Assay (FDA approved for NASAL specimens only), is one component of a comprehensive MRSA colonization surveillance program. It is not  intended to diagnose MRSA infection nor to guide or monitor treatment for MRSA infections. Test performance is not FDA approved in patients less than 18 years old. Performed at [facility], [place] [address], [place], [place] [address]       Studies: IR IVC FILTER PLMT / S&I Burke Keels GUID/MOD SED  Result Date: 11/25/2020 INDICATION: 75 year old male with recurrent DVT despite anticoagulation. Peripheral IV access could not be obtained. Therefore, ultrasound-guided deep vein IV placement was performed by the MD. EXAM: ULTRASOUND GUIDANCE FOR [place] IVC CATHETERIZATION AND VENOGRAM IVC FILTER INSERTION Interventional Radiologist:  Criselda Peaches, MD MEDICATIONS: None. ANESTHESIA/SEDATION: Fentanyl 100 mcg IV; Versed 2 mg IV Moderate Sedation Time:  23 minutes The patient was continuously monitored during the procedure by the interventional radiology nurse under my direct supervision. FLUOROSCOPY TIME:  Fluoroscopy Time: 1 minutes 48 seconds (228 mGy). COMPLICATIONS: None immediate. PROCEDURE: Informed written consent was obtained from the patient after a thorough discussion of the procedural risks, benefits and alternatives. All questions were addressed. Maximal Sterile Barrier Technique was utilized including caps, mask, sterile gowns, sterile gloves, sterile drape, hand hygiene and skin antiseptic. A timeout was performed prior to the initiation of the procedure. The right brachial vein was interrogated with ultrasound and found to be widely patent. An image was obtained and stored for the medical record. Local anesthesia was attained by infiltration with 1% lidocaine. A small dermatotomy was made. Under real-time sonographic guidance, the vessel was punctured with a 21 gauge micropuncture needle. Using standard technique, the initial micro needle was exchanged over a 0.018 micro wire for a transitional 4 [place] micro sheath. The micro sheath was then converted into an IV by  adding a valved capped. The IV flushes and aspirates easily. The IV was secured to the skin in standard fashion. Maximal barrier sterile technique utilized including caps, mask, sterile gowns, sterile gloves, large sterile drape, hand hygiene, and Betadine prep. Under sterile condition and local anesthesia, right internal jugular venous access was performed with ultrasound. An ultrasound image was saved and sent to PACS. Over a guidewire, the IVC filter delivery sheath and inner dilator were advanced into the IVC just above the IVC bifurcation. Contrast injection was performed for an IVC venogram. Through the delivery sheath, a retrievable Denali IVC  filter was deployed below the level of the renal veins and above the IVC bifurcation. Limited post deployment venacavagram was performed. The delivery sheath was removed and hemostasis was obtained with manual compression. A dressing was placed. The patient tolerated the procedure well without immediate post procedural complication. FINDINGS: The IVC is patent. No evidence of thrombus, stenosis, or occlusion. No variant venous anatomy. Successful placement of the IVC filter below the level of the renal veins. IMPRESSION: Successful ultrasound and fluoroscopically guided placement of an infrarenal retrievable IVC filter via right jugular approach. PLAN: Due to patient related comorbidities and/or clinical necessity, this IVC filter should be considered a permanent device. This patient will not be actively followed for future filter retrieval. Electronically Signed   By: Jacqulynn Cadet M.D.   On: 11/25/2020 13:51   IR US Guide Vasc Access Right  Result Date: 11/25/2020 INDICATION: 75 year old male with recurrent DVT despite anticoagulation. Peripheral IV access could not be obtained. Therefore, ultrasound-guided deep vein IV placement was performed by the MD. EXAM: ULTRASOUND GUIDANCE FOR [place] IVC CATHETERIZATION AND VENOGRAM IVC FILTER INSERTION  Interventional Radiologist:  Criselda Peaches, MD MEDICATIONS: None. ANESTHESIA/SEDATION: Fentanyl 100 mcg IV; Versed 2 mg IV Moderate Sedation Time:  23 minutes The patient was continuously monitored during the procedure by the interventional radiology nurse under my direct supervision. FLUOROSCOPY TIME:  Fluoroscopy Time: 1 minutes 48 seconds (228 mGy). COMPLICATIONS: None immediate. PROCEDURE: Informed written consent was obtained from the patient after a thorough discussion of the procedural risks, benefits and alternatives. All questions were addressed. Maximal Sterile Barrier Technique was utilized including caps, mask, sterile gowns, sterile gloves, sterile drape, hand hygiene and skin antiseptic. A timeout was performed prior to the initiation of the procedure. The right brachial vein was interrogated with ultrasound and found to be widely patent. An image was obtained and stored for the medical record. Local anesthesia was attained by infiltration with 1% lidocaine. A small dermatotomy was made. Under real-time sonographic guidance, the vessel was punctured with a 21 gauge micropuncture needle. Using standard technique, the initial micro needle was exchanged over a 0.018 micro wire for a transitional 4 [place] micro sheath. The micro sheath was then converted into an IV by adding a valved capped. The IV flushes and aspirates easily. The IV was secured to the skin in standard fashion. Maximal barrier sterile technique utilized including caps, mask, sterile gowns, sterile gloves, large sterile drape, hand hygiene, and Betadine prep. Under sterile condition and local anesthesia, right internal jugular venous access was performed with ultrasound. An ultrasound image was saved and sent to PACS. Over a guidewire, the IVC filter delivery sheath and inner dilator were advanced into the IVC just above the IVC bifurcation. Contrast injection was performed for an IVC venogram. Through the delivery sheath, a  retrievable Denali IVC filter was deployed below the level of the renal veins and above the IVC bifurcation. Limited post deployment venacavagram was performed. The delivery sheath was removed and hemostasis was obtained with manual compression. A dressing was placed. The patient tolerated the procedure well without immediate post procedural complication. FINDINGS: The IVC is patent. No evidence of thrombus, stenosis, or occlusion. No variant venous anatomy. Successful placement of the IVC filter below the level of the renal veins. IMPRESSION: Successful ultrasound and fluoroscopically guided placement of an infrarenal retrievable IVC filter via right jugular approach. PLAN: Due to patient related comorbidities and/or clinical necessity, this IVC filter should be considered a permanent device. This patient will not be  actively followed for future filter retrieval. Electronically Signed   By: Jacqulynn Cadet M.D.   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 Peripheral IV access could not be obtained. Therefore, ultrasound-guided deep vein IV placement was performed by the MD. EXAM: ULTRASOUND GUIDANCE FOR [place] IVC CATHETERIZATION AND VENOGRAM IVC FILTER INSERTION Interventional Radiologist:  Criselda Peaches, MD MEDICATIONS: None. ANESTHESIA/SEDATION: Fentanyl 100 mcg IV; Versed 2 mg IV Moderate Sedation Time:  23 minutes The patient was continuously monitored during the procedure by the interventional radiology nurse under my direct supervision. FLUOROSCOPY TIME:  Fluoroscopy Time: 1 minutes 48 seconds (228 mGy). COMPLICATIONS: None immediate. PROCEDURE: Informed written consent was obtained from the patient after a thorough discussion of the procedural risks, benefits and alternatives. All questions were addressed. Maximal Sterile Barrier Technique was utilized including caps,  mask, sterile gowns, sterile gloves, sterile drape, hand hygiene and skin antiseptic. A timeout was performed prior to the initiation of the procedure. The right brachial vein was interrogated with ultrasound and found to be widely patent. An image was obtained and stored for the medical record. Local anesthesia was attained by infiltration with 1% lidocaine. A small dermatotomy was made. Under real-time sonographic guidance, the vessel was punctured with a 21 gauge micropuncture needle. Using standard technique, the initial micro needle was exchanged over a 0.018 micro wire for a transitional 4 [place] micro sheath. The micro sheath was then converted into an IV by adding a valved capped. The IV flushes and aspirates easily. The IV was secured to the skin in standard fashion. Maximal barrier sterile technique utilized including caps, mask, sterile gowns, sterile gloves, large sterile drape, hand hygiene, and Betadine prep. Under sterile condition and local anesthesia, right internal jugular venous access was performed with ultrasound. An ultrasound image was saved and sent to PACS. Over a guidewire, the IVC filter delivery sheath and inner dilator were advanced into the IVC just above the IVC bifurcation. Contrast injection was performed for an IVC venogram. Through the delivery sheath, a retrievable Denali IVC filter was deployed below the level of the renal veins and above the IVC bifurcation. Limited post deployment venacavagram was performed. The delivery sheath was removed and hemostasis was obtained with manual compression. A dressing was placed. The patient tolerated the procedure well without immediate post procedural complication. FINDINGS: The IVC is patent. No evidence of thrombus, stenosis, or occlusion. No variant venous anatomy. Successful placement of the IVC filter below the level of the renal veins. IMPRESSION: Successful ultrasound and fluoroscopically guided placement of an infrarenal retrievable  IVC filter via right jugular approach. PLAN: Due to patient related comorbidities and/or clinical necessity, this IVC filter should be considered a permanent device. This patient will not be actively followed for future filter retrieval. Electronically Signed   By: Jacqulynn Cadet M.D.   On: 11/25/2020 13:51    Scheduled Meds:  apixaban  5 mg Oral BID   carvedilol  6.25 mg Oral QHS   insulin aspart  0-20 Units Subcutaneous TID WC   insulin aspart  0-5 Units Subcutaneous QHS   insulin aspart  4 Units Subcutaneous TID WC   insulin glargine-yfgn  14 Units Subcutaneous Daily   lidocaine  1 patch Transdermal Q24H   losartan  25 mg Oral Daily   magnesium oxide  800 mg Oral QHS   mouth rinse  15 mL Mouth Rinse BID   melatonin  3 mg Oral q1800   methadone  5 mg Oral TID   pantoprazole  20 mg Oral Daily   polyethylene glycol  17 g Oral TID   pravastatin  20 mg Oral Once per day on Mon Thu   pregabalin  25 mg Oral QHS   QUEtiapine  25 mg Oral QHS   senna-docusate  2 tablet Oral BID    Continuous Infusions:  promethazine (PHENERGAN) injection (IM or IVPB) Stopped (11/17/20 1007)     LOS: 9 days     Cristal Deer, MD [facility]  To reach me or the doctor on call, go to: [email or web address] Password [email or web address]  11/26/2020, 9:01 AM

## 2020-11-26 NOTE — Progress Notes (Signed)
Patient taken out of restraints to use the restroom. Restraints reapplied.

## 2020-11-27 LAB — COMPREHENSIVE METABOLIC PANEL
ALT: 16 U/L (ref 0–44)
AST: 15 U/L (ref 15–41)
Albumin: 3.8 g/dL (ref 3.5–5.0)
Alkaline Phosphatase: 75 U/L (ref 38–126)
Anion gap: 8 (ref 5–15)
BUN: 28 mg/dL — ABNORMAL HIGH (ref 8–23)
CO2: 22 mmol/L (ref 22–32)
Calcium: 10.9 mg/dL — ABNORMAL HIGH (ref 8.9–10.3)
Chloride: 103 mmol/L (ref 98–111)
Creatinine, Ser: 1.29 mg/dL — ABNORMAL HIGH (ref 0.61–1.24)
GFR, Estimated: 58 mL/min — ABNORMAL LOW (ref >60)
Glucose, Bld: 305 mg/dL — ABNORMAL HIGH (ref 70–99)
Potassium: 4.2 mmol/L (ref 3.5–5.1)
Sodium: 133 mmol/L — ABNORMAL LOW (ref 135–145)
Total Bilirubin: 0.5 mg/dL (ref 0.3–1.2)
Total Protein: 6.8 g/dL (ref 6.5–8.1)

## 2020-11-27 LAB — GLUCOSE, CAPILLARY
Glucose-Capillary: 250 mg/dL — ABNORMAL HIGH (ref 70–99)
Glucose-Capillary: 330 mg/dL — ABNORMAL HIGH (ref 70–99)
Glucose-Capillary: 291 mg/dL — ABNORMAL HIGH (ref 70–99)
Glucose-Capillary: 170 mg/dL — ABNORMAL HIGH (ref 70–99)

## 2020-11-27 MED ORDER — GUAIFENESIN-DM 100-10 MG/5ML PO SYRP
5.0000 mL | ORAL_SOLUTION | Freq: Three times a day (TID) | ORAL | Status: DC | PRN
  Administered 2020-11-27 – 2020-11-30 (×3): 5 mL via ORAL
  Filled 2020-11-27 (×3): qty 5

## 2020-11-27 NOTE — Progress Notes (Signed)
Patient supposed to get 19 units, states he will only take 12. 12 units administered.

## 2020-11-27 NOTE — Progress Notes (Signed)
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  Patient is followed by pain management specialist he was said to be receiving outpatient ketamine for pain with significant relief however it was put on hold due to his latest DVT he had been placed on Dilaudid which is not helping his pain as a result he took overdose of 100 tablets ended up in the emergency room admitted ICU and not released to hospitalist service he received on Narcan infusion.  He is currently being seen by psychiatry and palliative care Overnight patient became disoriented confused and very aggressive had to be put on restraints.  Subjective November 26, 2020 Patient seen and examined at bedside he is on tube point soft restraints he wants to get out of the restraints he did not understand why he is on restraints.  He stated that he wanted to go home.  But upon further discussion he stated that his wife has put him out he does not have a place to go now.  November 27, 2020: Patient seen and examined at bedside he is one-on-one sitter is still present And he is in a much better mood he had not been off his restraints and was walking down the hallway with his walker and the sitter.  He denies any new complaints Still waiting for placement to [facility] psych unit  Assessment/Plan: Active Problems:   Obstructive sleep apnea   Poorly controlled type 2 diabetes mellitus (HCC)   Benign essential HTN   Protein-calorie malnutrition, moderate (HCC)   Deep venous thrombosis (HCC)   Overdose, intentional self-harm, initial encounter ([place])   Intentional opiate overdose ([place])   Chronic back pain    Constipation due to opioid therapy  #1 DVT/hypercoagulable state/factor V Leyden:Doppler on 11/07/2020 revealed nonocclusive deep venous thrombus in the right common femoral and femoral veins with acute superficial greater saphenous vein thrombosis in the mid to distal thigh.   He was on Eliquis.  But underwent IVC filter because of his recurrent DVT  2.  Chronic pain with spinal stenosis chronic back pain.  Patient was seen by palliative care to recommend methadone but as outpatient she he was on ketamine which was stopped due to his DVT.  Now he has received IVC filter and the question is whether he needs to continue DVT pain management with ketamine. No further intervention necessary for spinal stenosis per neurosurgery Dr. Trenton Gammon  3.  Overdose which might be unintentional versus intentional Patient is IVCed  4.  Chronic atrial fibrillation on Eliquis and Coreg  5.  Constipation due to opioid Continue current bowel regimen  6.  Depression agitation aggressive labile mood Being followed by psychiatry Patient involuntarily committed on November 20, 2020 Possibly transfer to [facility] psychiatric bed  7.  Type 2 diabetes mellitus uncontrolled.  Patient has been refusing point-of-care glucose monitoring  8.  Hypercalciuric: Longstanding asymptomatic hypercalcemia  9.  Mild hyponatremia we will continue to monitor  10.  Chronic kidney disease.  His creatinine today is 2 9.  We will encouraged to continue hydration  Code Status: Full  Severity of Illness: The appropriate patient status for this patient is INPATIENT. Inpatient status is judged to  be reasonable and necessary in order to provide the required intensity of service to ensure the patient's safety. The patient's presenting symptoms, physical exam findings, and initial radiographic and laboratory data in the context of their chronic comorbidities is felt to place them at high risk for further clinical deterioration. Furthermore, it is not  anticipated that the patient will be medically stable for discharge from the hospital within 2 midnights of admission. The following factors support the patient status of inpatient.   " The patien his IVC, waiting for Geri psych   * I certify that at the point of admission it is my clinical judgment that the patient will require inpatient hospital care spanning beyond 2 midnights from the point of admission due to high intensity of service, high risk for further deterioration and high frequency of surveillance required.*   Family Communication: None at bedside  Disposition Plan:   Status is: Inpatient   Dispo: The patient is from: Home              Anticipated d/c is to: Possibly Geri psych              Anticipated d/c date is:               Patient currently not medically stable for discharge  Consultants: Palliative care Psychiatry Interventional radiology  Procedures:  RIGHT IVC filter placement 10/6  Antimicrobials: None  DVT prophylaxis: Eliquis   Objective: Vitals:   11/25/20 1310 11/25/20 2106 11/26/20 0554 11/27/20 0559  BP: (!) 153/85 (!) 142/71 138/76 107/64  Pulse:  78 70 (!) 55  Resp: (!) 21 18 16 14   Temp:  98.6 F (37 C) 98.2 F (36.8 C) 97.8 F (36.6 C)  TempSrc:  Oral Oral Oral  SpO2:  96% 97% 95%    Intake/Output Summary (Last 24 hours) at 11/27/2020 1003 Last data filed at 11/27/2020 0944 Gross per 24 hour  Intake 480 ml  Output --  Net 480 ml    There were no vitals filed for this visit. There is no height or weight on file to calculate BMI.  Exam:  General: 75 y.o. year-old male well developed well nourished in no acute distress.  Alert and oriented x3. Cardiovascular: Regular rate and rhythm with no rubs or gallops.  No thyromegaly or JVD noted.   Respiratory: Clear to auscultation with no wheezes or rales. Good inspiratory effort. Abdomen: Soft nontender nondistended with normal bowel sounds x4 quadrants. Musculoskeletal: No lower  extremity edema. 2/4 pulses in all 4 extremities. Skin: No ulcerative lesions noted or rashes, Psychiatry: Mood is appropriate for condition and setting somewhat pleasant at the end of the conversation after we explained to him why he was put on restraint and I agreed to let him get up to use the bathroom Neurology:    Data Reviewed: CBC: Recent Labs  Lab 11/25/20 0511  WBC 7.5  NEUTROABS 2.8  HGB 11.7*  HCT 34.8*  MCV 90.2  PLT 438*    Basic Metabolic Panel: Recent Labs  Lab 11/25/20 0511 11/27/20 0924  NA 132* 133*  K 4.0 4.2  CL 106 103  CO2 20* 22  GLUCOSE 229* 305*  BUN 25* 28*  CREATININE 1.02 1.29*  CALCIUM 10.6* 10.9*    GFR: Estimated Creatinine Clearance: 64.3 mL/min (A) (by C-G formula based on SCr of 1.29 mg/dL (H)). Liver Function Tests: Recent Labs  Lab 11/27/20 0924  AST 15  ALT 16  ALKPHOS 75  BILITOT 0.5  PROT 6.8  ALBUMIN 3.8    No results for input(s): LIPASE, AMYLASE in the last 168 hours. No results for input(s): AMMONIA in the last 168 hours. Coagulation Profile: Recent Labs  Lab 11/25/20 0511  INR 1.3*    Cardiac Enzymes: No results for input(s): CKTOTAL, CKMB, CKMBINDEX, TROPONINI in the last 168 hours. BNP (last 3 results) No results for input(s): PROBNP in the last 8760 hours. HbA1C: No results for input(s): HGBA1C in the last 72 hours. CBG: Recent Labs  Lab 11/26/20 0844 11/26/20 1136 11/26/20 1655 11/26/20 2112 11/27/20 0805  GLUCAP 218* 259* 388* 304* 250*    Lipid Profile: No results for input(s): CHOL, HDL, LDLCALC, TRIG, CHOLHDL, LDLDIRECT in the last 72 hours. Thyroid Function Tests: No results for input(s): TSH, T4TOTAL, FREET4, T3FREE, THYROIDAB in the last 72 hours. Anemia Panel: No results for input(s): VITAMINB12, FOLATE, FERRITIN, TIBC, IRON, RETICCTPCT in the last 72 hours. Urine analysis:    Component Value Date/Time   COLORURINE YELLOW (A) 11/17/2020 2230   APPEARANCEUR CLEAR (A) 11/17/2020  2230   LABSPEC 1.020 11/17/2020 2230   PHURINE 6.0 11/17/2020 2230   GLUCOSEU >1,000 (A) 11/17/2020 2230   HGBUR LARGE (A) 11/17/2020 2230   BILIRUBINUR NEGATIVE 11/17/2020 2230   BILIRUBINUR negative 07/04/2011 1902   KETONESUR NEGATIVE 11/17/2020 2230   PROTEINUR 100 (A) 11/17/2020 2230   UROBILINOGEN 0.2 05/22/2013 0924   NITRITE POSITIVE (A) 11/17/2020 2230   LEUKOCYTESUR NEGATIVE 11/17/2020 2230   Sepsis Labs: @LABRCNTIP (procalcitonin:4,lacticidven:4)  ) No results found for this or any previous visit (from the past 240 hour(s)).     Studies: No results found.  Scheduled Meds:  apixaban  5 mg Oral BID   carvedilol  6.25 mg Oral QHS   insulin aspart  0-20 Units Subcutaneous TID WC   insulin aspart  0-5 Units Subcutaneous QHS   insulin aspart  4 Units Subcutaneous TID WC   insulin glargine-yfgn  14 Units Subcutaneous Daily   lidocaine  1 patch Transdermal Q24H   losartan  25 mg Oral Daily   magnesium oxide  800 mg Oral QHS   mouth rinse  15 mL Mouth Rinse BID   melatonin  3 mg Oral q1800   methadone  5 mg Oral TID   pantoprazole  20 mg Oral Daily   polyethylene glycol  17 g Oral TID   pravastatin  20 mg Oral Once per day on Mon Thu   pregabalin  25 mg Oral QHS   QUEtiapine  25 mg Oral QHS   senna-docusate  2 tablet Oral BID    Continuous Infusions:  promethazine (PHENERGAN) injection (IM or IVPB) Stopped (11/17/20 1007)     LOS: 10 days     Cristal Deer, MD [facility]  To reach me or the doctor on call, go to: [email or web address] Password [email or web address]  11/27/2020, 10:03 AM

## 2020-11-27 NOTE — Progress Notes (Signed)
Pt is injury free, afebrile, alert, and oriented x 4. VS within baseline. He denies SI, or HI. Sitter remains at bedside. He was calm and compliant with taking his medications. He denies chest pain, SOB, or acute changes. Will continue to monitor

## 2020-11-27 NOTE — Plan of Care (Signed)
  Problem: Education: Goal: Knowledge of General Education information will improve Description: Including pain rating scale, medication(s)/side effects and non-pharmacologic comfort measures Outcome: Progressing   Problem: Health Behavior/Discharge Planning: Goal: Ability to manage health-related needs will improve Outcome: Progressing   Problem: Clinical Measurements: Goal: Ability to maintain clinical measurements within normal limits will improve Outcome: Progressing Goal: Will remain free from infection Outcome: Progressing Goal: Diagnostic test results will improve Outcome: Progressing Goal: Respiratory complications will improve Outcome: Progressing Goal: Cardiovascular complication will be avoided Outcome: Progressing   Problem: Pain Managment: Goal: General experience of comfort will improve Outcome: Progressing   Problem: Safety: Goal: Ability to remain free from injury will improve Outcome: Progressing   Problem: Skin Integrity: Goal: Risk for impaired skin integrity will decrease Outcome: Progressing   Problem: Education: Goal: Knowledge of warning signs, risks, and behaviors that relate to suicide ideation and self-harm behaviors will improve Outcome: Progressing   Problem: Clinical Measurements: Goal: Remain free from any harm during hospitalization Outcome: Progressing   Problem: Nutrition: Goal: Adequate fluids and nutrition will be maintained Outcome: Progressing   Problem: Self Esteem: Goal: Ability to verbalize positive feeling about self will improve Outcome: Progressing   Problem: Safety: Goal: Non-violent Restraint(s) Outcome: Progressing

## 2020-11-28 LAB — GLUCOSE, CAPILLARY
Glucose-Capillary: 191 mg/dL — ABNORMAL HIGH (ref 70–99)
Glucose-Capillary: 189 mg/dL — ABNORMAL HIGH (ref 70–99)
Glucose-Capillary: 332 mg/dL — ABNORMAL HIGH (ref 70–99)
Glucose-Capillary: 369 mg/dL — ABNORMAL HIGH (ref 70–99)

## 2020-11-28 NOTE — Progress Notes (Signed)
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  Patient is followed by pain management specialist he was said to be receiving outpatient ketamine for pain with significant relief however it was put on hold due to his latest DVT he had been placed on Dilaudid which is not helping his pain as a result he took overdose of 100 tablets ended up in the emergency room admitted ICU and not released to hospitalist service he received on Narcan infusion.  He is currently being seen by psychiatry and palliative care Overnight patient became disoriented confused and very aggressive had to be put on restraints.  Subjective November 26, 2020 Patient seen and examined at bedside he is on tube point soft restraints he wants to get out of the restraints he did not understand why he is on restraints.  He stated that he wanted to go home.  But upon further discussion he stated that his wife has put him out he does not have a place to go now.  November 27, 2020: Patient seen and examined at bedside he is one-on-one sitter is still present And he is in a much better mood he had not been off his restraints and was walking down the hallway with his walker and the sitter.  He denies any new complaints Still waiting for placement to [place] unit  November 28, 2020: Patient seen and examined at bedside He is off his restraint.  He is starting to get agitated and upset because his wife has not talked to him or come to visit him he removed his ring he said he was going to give his wedding ring back today his wife and he just wants to get out of here and find hotel room and then find  a place for him to stay and that he may not discontinue his ketamine treatment  Assessment/Plan: Active Problems:   Obstructive sleep apnea   Poorly controlled type 2 diabetes mellitus ([place])   Benign essential HTN   Protein-calorie malnutrition, moderate ([place])   Deep venous thrombosis ([place])   Overdose, intentional self-harm, initial encounter ([place])   Intentional opiate overdose ([place])   Chronic back pain   Constipation due to opioid therapy  #1 DVT/hypercoagulable state/factor V Leyden:Doppler on 11/07/2020 revealed nonocclusive deep venous thrombus in the right common femoral and femoral veins with acute superficial greater saphenous vein thrombosis in the mid to distal thigh.   He was on Eliquis.  But underwent IVC filter because of his recurrent DVT  2.  Chronic pain with spinal stenosis chronic back pain.  Patient was seen by palliative care to recommend methadone but as outpatient she he was on ketamine which was stopped due to his DVT.  Now he has received IVC filter and the question is whether he needs to continue DVT pain management with ketamine. No further intervention necessary for spinal stenosis per neurosurgery Dr. Trenton Gammon  3.  Overdose which might be unintentional versus intentional Patient is IVCed  4.  Chronic atrial fibrillation on Eliquis and Coreg  5.  Constipation due to opioid Continue current bowel regimen  6.  Depression agitation aggressive labile mood Being followed by psychiatry Patient involuntarily  committed on November 20, 2020 Possibly transfer to [facility] psychiatric bed  7.  Type 2 diabetes mellitus uncontrolled.  Patient has been refusing point-of-care glucose monitoring  8.  Hypercalciuric: Longstanding asymptomatic hypercalcemia  9.  Mild hyponatremia we will continue to monitor  10.  Chronic kidney disease.  His creatinine today is 2 9.  We will encouraged to continue hydration  Code Status: Full  Severity of Illness: The appropriate patient  status for this patient is INPATIENT. Inpatient status is judged to be reasonable and necessary in order to provide the required intensity of service to ensure the patient's safety. The patient's presenting symptoms, physical exam findings, and initial radiographic and laboratory data in the context of their chronic comorbidities is felt to place them at high risk for further clinical deterioration. Furthermore, it is not anticipated that the patient will be medically stable for discharge from the hospital within 2 midnights of admission. The following factors support the patient status of inpatient.   " The patien his IVC, waiting for Geri psych   * I certify that at the point of admission it is my clinical judgment that the patient will require inpatient hospital care spanning beyond 2 midnights from the point of admission due to high intensity of service, high risk for further deterioration and high frequency of surveillance required.*   Family Communication: None at bedside  Disposition Plan:   Status is: Inpatient   Dispo: The patient is from: Home              Anticipated d/c is to: Possibly Geri psych              Anticipated d/c date is:               Patient currently not medically stable for discharge  Consultants: Palliative care Psychiatry Interventional radiology  Procedures:  RIGHT IVC filter placement 10/6  Antimicrobials: None  DVT prophylaxis: Eliquis   Objective: Vitals:   11/27/20 1356 11/27/20 2145 11/28/20 0607 11/28/20 1331  BP: (!) 123/55 139/78 126/69 109/69  Pulse: 84 67 69 99  Resp: 18 18 18 14   Temp: 97.9 F (36.6 C) 98.3 F (36.8 C) 99 F (37.2 C) 98.5 F (36.9 C)  TempSrc: Oral Oral Oral   SpO2: 99% 99% 97% 98%   No intake or output data in the 24 hours ending 11/28/20 1718  There were no vitals filed for this visit. There is no height or weight on file to calculate BMI.  Exam:  General: 75 y.o. year-old male well developed well  nourished in no acute distress.  Alert and oriented x3. Cardiovascular: Regular rate and rhythm with no rubs or gallops.  No thyromegaly or JVD noted.   Respiratory: Clear to auscultation with no wheezes or rales. Good inspiratory effort. Abdomen: Soft nontender nondistended with normal bowel sounds x4 quadrants. Musculoskeletal: No lower extremity edema. 2/4 pulses in all 4 extremities. Skin: No ulcerative lesions noted or rashes, Psychiatry: Mood is appropriate for condition and setting somewhat pleasant at the end of the conversation after we explained to him why he was put on restraint and I agreed to let him get up to use the bathroom Neurology:    Data Reviewed: CBC: Recent Labs  Lab 11/25/20 0511  WBC 7.5  NEUTROABS 2.8  HGB 11.7*  HCT 34.8*  MCV 90.2  PLT 438*    Basic Metabolic Panel: Recent Labs  Lab 11/25/20 0511 11/27/20 0924  NA 132* 133*  K 4.0  4.2  CL 106 103  CO2 20* 22  GLUCOSE 229* 305*  BUN 25* 28*  CREATININE 1.02 1.29*  CALCIUM 10.6* 10.9*    GFR: Estimated Creatinine Clearance: 64.3 mL/min (A) (by C-G formula based on SCr of 1.29 mg/dL (H)). Liver Function Tests: Recent Labs  Lab 11/27/20 0924  AST 15  ALT 16  ALKPHOS 75  BILITOT 0.5  PROT 6.8  ALBUMIN 3.8    No results for input(s): LIPASE, AMYLASE in the last 168 hours. No results for input(s): AMMONIA in the last 168 hours. Coagulation Profile: Recent Labs  Lab 11/25/20 0511  INR 1.3*    Cardiac Enzymes: No results for input(s): CKTOTAL, CKMB, CKMBINDEX, TROPONINI in the last 168 hours. BNP (last 3 results) No results for input(s): PROBNP in the last 8760 hours. HbA1C: No results for input(s): HGBA1C in the last 72 hours. CBG: Recent Labs  Lab 11/27/20 1706 11/27/20 2142 11/28/20 0715 11/28/20 1124 11/28/20 1619  GLUCAP 291* 170* 191* 189* 332*    Lipid Profile: No results for input(s): CHOL, HDL, LDLCALC, TRIG, CHOLHDL, LDLDIRECT in the last 72 hours. Thyroid  Function Tests: No results for input(s): TSH, T4TOTAL, FREET4, T3FREE, THYROIDAB in the last 72 hours. Anemia Panel: No results for input(s): VITAMINB12, FOLATE, FERRITIN, TIBC, IRON, RETICCTPCT in the last 72 hours. Urine analysis:    Component Value Date/Time   COLORURINE YELLOW (A) 11/17/2020 2230   APPEARANCEUR CLEAR (A) 11/17/2020 2230   LABSPEC 1.020 11/17/2020 2230   PHURINE 6.0 11/17/2020 2230   GLUCOSEU >1,000 (A) 11/17/2020 2230   HGBUR LARGE (A) 11/17/2020 2230   BILIRUBINUR NEGATIVE 11/17/2020 2230   BILIRUBINUR negative 07/04/2011 1902   KETONESUR NEGATIVE 11/17/2020 2230   PROTEINUR 100 (A) 11/17/2020 2230   UROBILINOGEN 0.2 05/22/2013 0924   NITRITE POSITIVE (A) 11/17/2020 2230   LEUKOCYTESUR NEGATIVE 11/17/2020 2230   Sepsis Labs: @LABRCNTIP (procalcitonin:4,lacticidven:4)  ) No results found for this or any previous visit (from the past 240 hour(s)).     Studies: No results found.  Scheduled Meds:  apixaban  5 mg Oral BID   carvedilol  6.25 mg Oral QHS   insulin aspart  0-20 Units Subcutaneous TID WC   insulin aspart  0-5 Units Subcutaneous QHS   insulin aspart  4 Units Subcutaneous TID WC   insulin glargine-yfgn  14 Units Subcutaneous Daily   lidocaine  1 patch Transdermal Q24H   losartan  25 mg Oral Daily   magnesium oxide  800 mg Oral QHS   mouth rinse  15 mL Mouth Rinse BID   melatonin  3 mg Oral q1800   methadone  5 mg Oral TID   pantoprazole  20 mg Oral Daily   polyethylene glycol  17 g Oral TID   pravastatin  20 mg Oral Once per day on Mon Thu   pregabalin  25 mg Oral QHS   QUEtiapine  25 mg Oral QHS   senna-docusate  2 tablet Oral BID    Continuous Infusions:  promethazine (PHENERGAN) injection (IM or IVPB) Stopped (11/17/20 1007)     LOS: 11 days     Cristal Deer, MD [facility]  To reach me or the doctor on call, go to: [email or web address] Password [email or web address]  11/28/2020, 5:18 PM

## 2020-11-28 NOTE — Progress Notes (Signed)
Pt is injury free, afebrile, alert, and oriented x 4. VS within baseline. He denies SI, or HI. Sitter remains at bedside. He was calm and compliant with taking his medications. He denies chest pain, SOB, or acute changes. Will continue to monitor

## 2020-11-29 ENCOUNTER — Other Ambulatory Visit (HOSPITAL_BASED_OUTPATIENT_CLINIC_OR_DEPARTMENT_OTHER): Payer: Self-pay

## 2020-11-29 DIAGNOSIS — T50902A Poisoning by unspecified drugs, medicaments and biological substances, intentional self-harm, initial encounter: Secondary | ICD-10-CM | POA: Diagnosis not present

## 2020-11-29 DIAGNOSIS — F322 Major depressive disorder, single episode, severe without psychotic features: Secondary | ICD-10-CM | POA: Diagnosis not present

## 2020-11-29 LAB — GLUCOSE, CAPILLARY
Glucose-Capillary: 244 mg/dL — ABNORMAL HIGH (ref 70–99)
Glucose-Capillary: 244 mg/dL — ABNORMAL HIGH (ref 70–99)
Glucose-Capillary: 244 mg/dL — ABNORMAL HIGH (ref 70–99)
Glucose-Capillary: 307 mg/dL — ABNORMAL HIGH (ref 70–99)

## 2020-11-29 NOTE — Care Management Important Message (Signed)
Important Message  Patient Details IM Letter given to the Patient. Name: Derek [name] MRN: [id number] Date of Birth: 1946-01-26   Medicare Important Message Given:  Yes     Kerin Salen 11/29/2020, 10:58 AM

## 2020-11-29 NOTE — Progress Notes (Signed)
At 2100 patient became agitated, refusing to take hs meds, with this Probation officer, and another Rn. During this time, patient was in bed, aggressively tried to get out of bed, at which time this writer prevented patient from standing up.  This episode lasted approximately 5 seconds.  Explained to patient that aggression toward staff will not be tolerated.   Patient initially requested another Rn. Instructed patient that until he was able to calm down, and maintain self control I could not leave the room. Patient subsequently was able to demonstrate self control,  taking prescribed medications. Sitter at bedside during this time, no further episodes noted

## 2020-11-29 NOTE — TOC Progression Note (Signed)
Transition of Care [facility]) - Progression Note    Patient Details  Name: Derek [name] MRN: [id number] Date of Birth: 1945/11/04  Transition of Care [facility]) CM/SW [place], [place] Phone Number: 11/29/2020, 1:01 PM  Clinical Narrative:  FAXed updated clinicals to [facility], [place], [place], [place]. TOC will continue to follow during the course of hospitalization.      Expected Discharge Plan: Psychiatric Hospital Barriers to Discharge: Other (must enter comment) [facility] pending bed offer)  Expected Discharge Plan and Services Expected Discharge Plan: [facility]   Discharge Planning Services: CM Consult   Living arrangements for the past 2 months: Single Family Home                                       Social Determinants of Health (SDOH) Interventions    Readmission Risk Interventions No flowsheet data found.

## 2020-11-29 NOTE — Consult Note (Addendum)
[facility] New Psychiatric Evaluation   Service Date: November 29, 2020 LOS:  LOS: 12 days   Assessment  Derek [name] is a 75 y.o. male admitted medically for 11/17/2020  3:38 AM for an overdose on approximately 800 mg of dilaudid. He has no psychiatric diagnoses and has an extensive past medical history pertinent for chronic pain and recent DVT; see PMH below which I have reviewed. Psychiatry was consulted for suicide attempt by [name] and [name]    His initial presentation of a serious suicide attempt requiring ICU stay and narcan infusion is most consistent with MDD/severe; unclear whether single untreated episode or recurrent. He has no medications and has not seen a psychiatrist.  He does meet criteria for MDD (anhedonia, sleep, energy most significant on exam). 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 On initial examination, patient stated that he regretted his attempt and is "glad" things worked out the way they did. He is embarrassedly about some of what we discussed and ask that I attempt to block his  notes (option not enabled for my account); have removed some sensitive information not necessary for clinical decision making. We discussed inpatient psychiatric hospitalization which he is considering. Did not discuss IVC. Please see plan below for detailed recommendations.   10/7: Hospital course has been complicated by multiple episodes of agitation (typically after BZD). Has not been accepted to inpt gero psych. Has been having episodes of agitation usually after BZD administration (prior to episode yesterday he got midazolam for the IVC filter placement).   10/10: Patient continues to deny SI, HI, and AH/VH. Have spoken at length to wife discussing a potential safety plan to return home. Today, patient was open, honest, and we had a good discussion about some protective factors. Wife is now in  support of him coming home with followup appointments and safety plan; have asked SW to begin marshalling resources for an outpatient plan.   Personality structure, frustration tolerance, age, chronic pain, remain significant risk factors. Is religious, married, and will not have access to lethal means after dc. Also hopeful that outpt ketamine will be helpful both for pain and mood.   Diagnoses:  Active Hospital problems: Active Problems:   Obstructive sleep apnea   Poorly controlled type 2 diabetes mellitus (HCC)   Benign essential HTN   Protein-calorie malnutrition, moderate (HCC)   Deep venous thrombosis (HCC)   Overdose, intentional self-harm, initial encounter ([place])   Intentional opiate overdose ([place])   Chronic back pain   Constipation due to opioid therapy    Problems edited/added by me: No problems updated.  Plan  ## Safety and Observation Level:  - Based on my clinical evaluation, I estimate the patient to be at high risk of self harm in the current setting - At this time, we continue to recommend a 1:1 sitter level of observation (will likely dc tomorrow). This decision is based on my review of the chart including patient's history and current presentation, interview of the patient, mental status examination, and consideration of suicide risk including evaluating suicidal ideation, plan, intent, suicidal or self-harm behaviors, risk factors, and protective factors. This judgment is based on our ability to directly address suicide risk, implement suicide prevention strategies and develop a safety plan while the patient is in the clinical setting. Please contact our team if there is a concern that risk level has changed.   ## Medications:  --  quetiapine 25 mg QHS (agitation/insomnia)  ## Medical Decision Making Capacity:  Not formally assessed at this time  ## Further Work-up:  -- B12/TSH wnl  ## Disposition:  -- psychiatric hospitalization  ## Behavioral /  Environmental:  -- delirium precautions  ##Legal Status -- under IVC  Thank you for this consult request. Recommendations have been communicated to the primary team.  We will contiue to follow at this time.   [name] A [name]    NEW  Relevant Aspects of Hospital Course:  Admitted on 11/17/2020 for intentional OD on 800 mg dilaudid; required ICU admission and narcan infusion which is being tapered off. Has since tolerated narcan taper, had a couple of behavioral disturbances mostly related to BZD administration.   Over weekend made statements about "living in a motel if I have to"   Patient Report:  Saw in PM. Wife says he seems to be doing fine, wanted to see what was going on.   He spoke to wife and had a good conversation - now plans to go back home when he is released. He is upset about course of hospitalization, both the delay in not getting a psych bed and the multiple episodes of agitation he feels are due to BZD (are temporally related). He shares that he felt "wonderful" after 2nd dose of ketamine both from mood and pain standpoint and is hopeful his IVC filter will allow him to access this multimodal treatment. He is open to seeing a psychiatrist but remains hesitant aobut medications; willing to go to therapy after discharge. Shares that he is religious and this is a source of strength. Discussed ongoing marital difficulties (feels wife does not love him) and expressed a desire to work on the relationship without seeing it as essential for continued resolution of SI.   Spoke to sitter who said he has been talkative but appropriate all day.   Collateral Wife has locked up all the guns.  Wife thinks he could come home. She thinks he has been in the hospital long enough and needs to come home. Wife would be willing to have his pain medication locked up.   ROS:  Generally + for pain   PMH, social, allergies, family, psych history: see original consult note. Reviewed/Updated  as appropriate; pt has allergies listed to sertraline and duloxetine.    Current Medications:   Current Facility-Administered Medications:    acetaminophen (TYLENOL) tablet 650 mg, 650 mg, Oral, Q6H PRN, [name], MD, 650 mg at 11/27/20 0900   apixaban (ELIQUIS) tablet 5 mg, 5 mg, Oral, BID, [name], DO, 5 mg at 11/29/20 5035   bisacodyl (DULCOLAX) EC tablet 5 mg, 5 mg, Oral, Daily PRN, [name], MD   calcium carbonate (TUMS - dosed in mg elemental calcium) chewable tablet 200 mg of elemental calcium, 1 tablet, Oral, BID PRN, [name] P, DO, 200 mg of elemental calcium at 11/18/20 1617   carvedilol (COREG) tablet 6.25 mg, 6.25 mg, Oral, QHS, [name], MD, 6.25 mg at 11/28/20 2112   furosemide (LASIX) tablet 20 mg, 20 mg, Oral, Daily PRN, [name] P, DO   glucagon (human recombinant) (GLUCAGEN) injection 1 mg, 1 mg, Intravenous, Once PRN, [name] P, DO   guaiFENesin-dextromethorphan (ROBITUSSIN DM) 100-10 MG/5ML syrup 5 mL, 5 mL, Oral, Q8H PRN, Cristal Deer, MD, 5 mL at 11/27/20 2148   haloperidol lactate (HALDOL) injection 5 mg, 5 mg, Intramuscular, Q6H PRN, Vashti Hey, MD, 5 mg at 11/25/20 1705  hydrOXYzine (ATARAX/VISTARIL) tablet 10 mg, 10 mg, Oral, TID PRN, [name] P, DO, 10 mg at 11/22/20 1624   insulin aspart (novoLOG) injection 0-20 Units, 0-20 Units, Subcutaneous, TID WC, [name], MD, 7 Units at 11/29/20 1202   insulin aspart (novoLOG) injection 0-5 Units, 0-5 Units, Subcutaneous, QHS, [name], MD, 5 Units at 11/28/20 2116   insulin aspart (novoLOG) injection 4 Units, 4 Units, Subcutaneous, TID WC, [name], MD, 1 Units at 11/27/20 0902   insulin glargine-yfgn (SEMGLEE) injection 14 Units, 14 Units, Subcutaneous, Daily, [place] Pink, Trenton Gammon, MD   lidocaine (LIDODERM) 5 % 1 patch, 1 patch, Transdermal, Q24H, Blount, Scarlette Shorts T, NP, 1 patch at 11/25/20 0354   losartan (COZAAR) tablet 25 mg, 25 mg, Oral,  Daily, [name], MD, 25 mg at 11/29/20 6568   magnesium oxide (MAG-OX) tablet 800 mg, 800 mg, Oral, QHS, Chotiner, Yevonne Aline, MD, 800 mg at 11/28/20 2113   MEDLINE mouth rinse, 15 mL, Mouth Rinse, BID, [name] P, DO, 15 mL at 11/29/20 1275   melatonin tablet 3 mg, 3 mg, Oral, q1800, Tycho Cheramie A, 3 mg at 11/28/20 1648   methadone (DOLOPHINE) tablet 5 mg, 5 mg, Oral, TID, [name], MD, 5 mg at 11/29/20 0826   pantoprazole (PROTONIX) EC tablet 20 mg, 20 mg, Oral, Daily, [name] P, DO, 20 mg at 11/29/20 1700   polyethylene glycol (MIRALAX / GLYCOLAX) packet 17 g, 17 g, Oral, TID, [name], MD, 17 g at 11/25/20 2100   pravastatin (PRAVACHOL) tablet 20 mg, 20 mg, Oral, Once per day on Mon Thu, [name], Laura P, DO, 20 mg at 11/29/20 0834   pregabalin (LYRICA) capsule 25 mg, 25 mg, Oral, QHS, Bowser, Laurel Dimmer, NP, 25 mg at 11/28/20 2113   promethazine (PHENERGAN) 6.25 mg in sodium chloride 0.9 % 50 mL IVPB, 6.25 mg, Intravenous, Q6H PRN, [name], DO, Stopped at 11/17/20 1007   QUEtiapine (SEROQUEL) tablet 25 mg, 25 mg, Oral, QHS, Starkes-Perry, Takia S, FNP, 25 mg at 11/28/20 2113   senna-docusate (Senokot-S) tablet 2 tablet, 2 tablet, Oral, BID, [name], MD, 2 tablet at 11/25/20 2100   Theraworx Relief LIQD 1 application, 1 application, Apply externally, Daily PRN, Bowser, Laurel Dimmer, NP      Objective  Vital signs:  Temp:  [97.7 F (36.5 C)-98.7 F (37.1 C)] 98.7 F (37.1 C) (10/10 1409) Pulse Rate:  [60-90] 73 (10/10 1409) Resp:  [17-20] 17 (10/10 1409) BP: (108-128)/(65-79) 128/79 (10/10 1409) SpO2:  [97 %-99 %] 99 % (10/10 1409)   Physical Exam: Gen: alert, more attentive Pulm: no increased WOB Skin: no rash, lesions, piloerection noted Neuro: CNII-CXII grossly intact   Mental Status Exam: Appearance: Up, walking, animated  Attitude:  Less distractible/suspicius, open, occasionally irritated  Behavior/Psychomotor: Normal speed  and amount of gesturing  Speech/Language:  Tone, volume, spontaneity, prosody wnl. Increased amount.   Mood: Better today  Affect: Full range, angers easily  Thought process: Goal directed, some loosening of associations  Thought content:   Devoid of current suicidal or homicidal content, paranoia or delusions  Perceptual disturbances:  No ah/vh or illusions noted  Attention: Did not participate in formal attention testing  Concentration: No difficulty concentrating to conversation at hand  Orientation: Oriented to self, situation, month/year/date  Memory: Recent/remote appeared to be intact  Fund of knowledge:  Not formally assessed  Insight:   Poor (improving)  Judgment:  Very poor  Impulse Control: Very poor  Data Reviewed: PHQ2 did not screen + in 11/21   I personally spent 35 minutes on the unit in direct patient care. The direct patient care time included face-to-face time with the patient, reviewing the patient's chart, communicating with other professionals, and coordinating care. Greater than 50% of this time was spent in counseling or coordinating care with the patient regarding goals of hospitalization, psycho-education, and discharge planning needs.

## 2020-11-29 NOTE — Plan of Care (Signed)
  Problem: Education: Goal: Knowledge of General Education information will improve Description: Including pain rating scale, medication(s)/side effects and non-pharmacologic comfort measures Outcome: Progressing   Problem: Health Behavior/Discharge Planning: Goal: Ability to manage health-related needs will improve Outcome: Progressing   Problem: Clinical Measurements: Goal: Ability to maintain clinical measurements within normal limits will improve Outcome: Progressing Goal: Will remain free from infection Outcome: Progressing Goal: Diagnostic test results will improve Outcome: Progressing Goal: Respiratory complications will improve Outcome: Progressing Goal: Cardiovascular complication will be avoided Outcome: Progressing   Problem: Activity: Goal: Risk for activity intolerance will decrease Outcome: Progressing   Problem: Coping: Goal: Level of anxiety will decrease Outcome: Progressing   Problem: Elimination: Goal: Will not experience complications related to bowel motility Outcome: Progressing Goal: Will not experience complications related to urinary retention Outcome: Progressing   Problem: Pain Managment: Goal: General experience of comfort will improve Outcome: Progressing   Problem: Safety: Goal: Ability to remain free from injury will improve Outcome: Progressing   Problem: Skin Integrity: Goal: Risk for impaired skin integrity will decrease Outcome: Progressing   Problem: Education: Goal: Knowledge of warning signs, risks, and behaviors that relate to suicide ideation and self-harm behaviors will improve Outcome: Progressing   Problem: Health Behavior/Discharge (Transition) Planning: Goal: Ability to manage health-related needs will improve Outcome: Progressing   Problem: Clinical Measurements: Goal: Remain free from any harm during hospitalization Outcome: Progressing   Problem: Nutrition: Goal: Adequate fluids and nutrition will be  maintained Outcome: Progressing   Problem: Coping: Goal: Ability to disclose and discuss thoughts of suicide and self-harm will improve Outcome: Progressing   Problem: Medication Management: Goal: Adhere to prescribed medication regimen Outcome: Progressing   Problem: Sleep Hygiene: Goal: Ability to obtain adequate restful sleep will improve Outcome: Progressing   Problem: Self Esteem: Goal: Ability to verbalize positive feeling about self will improve Outcome: Progressing   Problem: Safety: Goal: Non-violent Restraint(s) Outcome: Progressing

## 2020-11-29 NOTE — Progress Notes (Signed)
Palliative care consultation for symptom management completed on 9/29 and additional recommendation made on 10/01. At this time patient does not have a life limiting diagnosis and his pain is being managed on current dose of methadone and lyrica. No further recommendations. He will need to follow up with both neurosurgery and his pain specialist as an outpatient for management of his severe spinal stenosis pain. These providers will need to be aware of his current admission and consider safeguards for further outpatient prescribing after he discharges from acute care. Palliative care will sign off. Please re-consult if his condition deteriorates or he has a change in his goals of care that would align with provision of palliative care services.  Lane Hacker, DO Palliative Medicine

## 2020-11-29 NOTE — Progress Notes (Signed)
Pt is injury free, afebrile, alert, and oriented x 4. VS within baseline. He denies SI, or HI. Sitter remains at bedside. He complained of moment of severe muscle spasms but declined intervention. He was calm and compliant with taking his medications. He denies chest pain, SOB, or acute changes. Will continue to monitor

## 2020-11-30 ENCOUNTER — Other Ambulatory Visit (HOSPITAL_BASED_OUTPATIENT_CLINIC_OR_DEPARTMENT_OTHER): Payer: Self-pay

## 2020-11-30 DIAGNOSIS — I82401 Acute embolism and thrombosis of unspecified deep veins of right lower extremity: Secondary | ICD-10-CM

## 2020-11-30 MED ORDER — GLUCAGON (RDNA) 1 MG IJ KIT
1.0000 mg | PACK | Freq: Once | INTRAMUSCULAR | 0 refills | Status: DC | PRN
  Filled 2020-11-30: qty 1, 1d supply, fill #0

## 2020-11-30 MED ORDER — APIXABAN 5 MG PO TABS
5.0000 mg | ORAL_TABLET | Freq: Two times a day (BID) | ORAL | 0 refills | Status: DC
  Filled 2020-11-30 – 2021-03-28 (×2): qty 60, 30d supply, fill #0

## 2020-11-30 MED ORDER — PREGABALIN 25 MG PO CAPS: 25.0000 mg | ORAL_CAPSULE | Freq: Every day | ORAL | 0 refills | Status: DC

## 2020-11-30 MED ORDER — QUETIAPINE FUMARATE 25 MG PO TABS
25.0000 mg | ORAL_TABLET | Freq: Every day | ORAL | 0 refills | Status: DC
  Filled 2020-11-30: qty 30, 30d supply, fill #0

## 2020-11-30 MED ORDER — LIDOCAINE 5 % EX PTCH
1.0000 | MEDICATED_PATCH | CUTANEOUS | 0 refills | Status: DC
  Filled 2020-11-30: qty 30, 30d supply, fill #0

## 2020-11-30 MED ORDER — ACETAMINOPHEN 325 MG PO TABS: 650.0000 mg | ORAL_TABLET | Freq: Four times a day (QID) | ORAL | 0 refills | Status: DC | PRN

## 2020-11-30 NOTE — Progress Notes (Signed)
Pt refused his blood sugar check this morning. Pt refused all his morning medicne. MD aware. Pt states he wants not to be bothered and left alone. Will continue monitor the pt.

## 2020-11-30 NOTE — Progress Notes (Signed)
Per charge nurse they do not want me to monitor pt in the bathroom

## 2020-11-30 NOTE — Progress Notes (Signed)
Pt daughter came to pic up the patient. Gave all his belongings to the daughter. Explained all the discharge instructions to daughter. No concerns and questions. Pt left the unit with nurse tech.

## 2020-11-30 NOTE — Discharge Summary (Signed)
Discharge Summary  Derek [name] RCV:[id number] DOB: 12/29/1945  PCP: [name], MD  Admit date: 11/17/2020 Discharge date: 11/30/2020  Time spent: 30 minutes  Recommendations for Outpatient Follow-up:  [facility] :PHP PCP in 1 to 2 weeks Outpatient pain management as previously arranged  Discharge Diagnoses:  Active Hospital Problems   Diagnosis Date Noted   Chronic back pain 11/24/2020   Constipation due to opioid therapy 11/24/2020   Intentional opiate overdose ([place]) 11/18/2020   Overdose, intentional self-harm, initial encounter ([place]) 11/17/2020   Deep venous thrombosis ([place]) 11/16/2020   Protein-calorie malnutrition, moderate ([place]) 04/04/2015   Benign essential HTN    Poorly controlled type 2 diabetes mellitus ([place])    Obstructive sleep apnea     Resolved Hospital Problems  No resolved problems to display.    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  Patient is followed by pain management specialist he was said to be receiving outpatient ketamine for pain with significant relief however it was put on hold due to his latest DVT he had been placed on Dilaudid which is not helping his pain as a result he took overdose of 100 tablets ended up in the emergency room admitted ICU and not released to hospitalist service he received on Narcan infusion.  He is currently being seen by psychiatry and palliative care Overnight patient became disoriented confused and very aggressive had to  be put on restraints.  And involuntary commitment papers where signed and he was IVC did with a sitter at bedside  Hospital Course:  Active Problems:   Obstructive sleep apnea   Poorly controlled type 2 diabetes mellitus ([facility])   Benign essential HTN   Protein-calorie malnutrition, moderate ([facility])   Deep venous thrombosis ([facility])   Overdose, intentional self-harm, initial encounter ([place])   Intentional opiate overdose ([place])   Chronic back pain   Constipation due to opioid therapy 75 year old male with past medical history of chronic pain involving low back, shoulders and knees as well as history of pancreatic cancer status post pancreatectomy and 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  Patient is followed by pain management specialist he was said to be receiving outpatient ketamine for pain with significant relief however it was put on hold due to his latest DVT he had been placed on Dilaudid which is not helping his pain as a result he took overdose of 100 tablets ended up in the emergency room admitted ICU and not released to hospitalist service he received on Narcan infusion.  He is currently being seen by psychiatry and palliative care Overnight patient became disoriented confused and very aggressive had to be put on restraints. Patient underwent IVC filter placement on November 25, 2020 because of his recurrent DVT despite anticoagulation and also so that he will be able to restart his ketamine treatment with his outpatient pain management provider.  But intervention radiology, psychiatry and palliative medicine consults where obtained.  He was started  on methadone and Lyrica while he was in the hospital this seems to control his pain He was refusing to get blood work and occasionally refusing his insulin while in the hospital when I asked him why he stated he read IV hydration low he is scared to have a low blood sugar. Has been discharged in  improved condition he will follow-up as stated above.  For details of his psychiatry evaluation and plan please see the psychiatrist note and consult.  Procedures: Right IVC filter placement November 25, 2020  Consultations: Psychiatry Palliative care Interventional radiology  Discharge Exam: BP 128/67 (BP Location: Left Arm)   Pulse 75   Temp 98.1 F (36.7 C)   Resp 20   SpO2 98%   General: Obese pleasant just wants to go home Cardiovascular: Regular rate and rhythm Respiratory: Clear to auscultation bilaterally  Discharge Instructions You were cared for by a hospitalist during your hospital stay. If you have any questions about your discharge medications or the care you received while you were in the hospital after you are discharged, you can call the unit and asked to speak with the hospitalist on call if the hospitalist that took care of you is not available. Once you are discharged, your primary care physician will handle any further medical issues. Please note that NO REFILLS for any discharge medications will be authorized once you are discharged, as it is imperative that you return to your primary care physician (or establish a relationship with a primary care physician if you do not have one) for your aftercare needs so that they can reassess your need for medications and monitor your lab values.  Discharge Instructions     Call MD for:  severe uncontrolled pain   Complete by: As directed    Diet - low sodium heart healthy   Complete by: As directed    Discharge instructions   Complete by: As directed    [facility] ([facility]) Follow-up with primary care provider in 1 week Follow-up with pain management as scheduled   Increase activity slowly   Complete by: As directed       Allergies as of 11/30/2020       Reactions   Duloxetine Other (See Comments)   Night sweats   Doxycycline Other (See Comments)   Severe nose bleeds    Morphine And Related Nausea And Vomiting, Swelling, Other (See Comments)   Facial swelling and lips swell, but tolerates immediate-release oxycodone as well as hydrocodone   Oxycontin [oxycodone Hcl] Swelling, Other (See Comments)   Lips swell, but tolerates immediate-release oxycodone as well as hydrocodone   Quinine Other (See Comments)   Platelets dropped- consumptive coagulopathy   Sertraline Other (See Comments)   Can't sleep or urinate   Voltaren [diclofenac Sodium] Other (See Comments)   Elevated liver enzymes   Diclofenac Swelling, Other (See Comments)   Elevated liver enzymes and lips swell   Doxycycline Hyclate Other (See Comments)   Epistaxis   Duloxetine Hcl Other (See Comments)   Night sweats   Gabapentin Other (See Comments)   Reaction??   Hydrocodone Swelling, Other (See Comments)   Facial swelling- can tolerate "immediate-release" Hydrocodone, though   Oxybutynin Other (See Comments)   Swelling   Sertraline Hcl Other (See Comments)   Urinary problems and insomnia   Tape Other (See Comments)   SKIN IS FRAGILE- CERTAIN BANDAGES TEAR OFF THE SKIN        Medication List  STOP taking these medications    amoxicillin-clavulanate 875-125 MG tablet Commonly known as: AUGMENTIN   [place] Dream Arthritis 0.025 % Crea Generic drug: Histamine Dihydrochloride   clindamycin 150 MG capsule Commonly known as: CLEOCIN   doxycycline 100 MG tablet Commonly known as: VIBRA-TABS   finasteride 5 MG tablet Commonly known as: Proscar   fluorouracil 5 % cream Commonly known as: EFUDEX   nitrofurantoin (macrocrystal-monohydrate) 100 MG capsule Commonly known as: Macrobid   Pfizer-BioNTech COVID-19 Vacc 30 MCG/0.3ML injection Generic drug: COVID-19 mRNA vaccine (Pfizer)   phenazopyridine 200 MG tablet Commonly known as: PYRIDIUM   Turmeric 500 MG Caps       TAKE these medications    acetaminophen 325 MG tablet Commonly known as: TYLENOL Take 2 tablets  (650 mg total) by mouth every 6 (six) hours as needed for moderate pain, headache or mild pain.   carvedilol 25 MG tablet Commonly known as: COREG Take one tablet by mouth once daily in the evening. What changed:  how much to take how to take this when to take this   Eliquis 5 MG Tabs tablet Generic drug: apixaban Take 1 tablet by mouth twice a day What changed: Another medication with the same name was added. Make sure you understand how and when to take each.   apixaban 5 MG Tabs tablet Commonly known as: ELIQUIS Take 1 tablet (5 mg total) by mouth 2 (two) times daily. What changed: You were already taking a medication with the same name, and this prescription was added. Make sure you understand how and when to take each.   furosemide 20 MG tablet Commonly known as: LASIX Take 20 mg by mouth daily as needed for edema.   glucagon (human recombinant) 1 MG injection Commonly known as: GLUCAGEN Inject 1 mg into the vein once as needed for low blood sugar ((give in addition to dextrose)).   HYDROmorphone 8 MG tablet Commonly known as: DILAUDID Take 1 tablet by mouth 7 times a day as needed   hydrOXYzine 25 MG capsule Commonly known as: VISTARIL Take 1 capsule by mouth every 8 hours as needed for stress   lansoprazole 30 MG capsule Commonly known as: PREVACID Take one capsule by mouth once daily. What changed:  how much to take how to take this when to take this   lidocaine 5 % Commonly known as: LIDODERM Place 1 patch onto the skin daily. Remove & Discard patch within 12 hours or as directed by MD Start taking on: December 01, 2020   losartan 50 MG tablet Commonly known as: COZAAR Take one tablet by mouth every evening. What changed:  how much to take how to take this when to take this   magnesium oxide 400 MG tablet Commonly known as: MAG-OX Take 800 mg by mouth at bedtime. Triple complex   megestrol 40 MG tablet Commonly known as: MEGACE Take one tablet by  mouth twice daily. What changed:  how much to take how to take this when to take this   NovoLOG 100 UNIT/ML injection Generic drug: insulin aspart USE 100U/DAY SUBCUTANEOUS VIA PUMP 30 DAY(S) What changed:  how much to take how to take this when to take this additional instructions Another medication with the same name was removed. Continue taking this medication, and follow the directions you see here.   polyethylene glycol powder 17 GM/SCOOP powder Commonly known as: MiraLax Mix 1 scoop (17g) in 8 ounces of water or juice once daily   pravastatin 20 MG  tablet Commonly known as: PRAVACHOL Take one tablet by mouth twice weekly. What changed:  how much to take how to take this when to take this   pregabalin 25 MG capsule Commonly known as: LYRICA Take 1 capsule (25 mg total) by mouth at bedtime.   QUEtiapine 25 MG tablet Commonly known as: SEROQUEL Take 1 tablet (25 mg total) by mouth at bedtime.   senna 8.6 MG Tabs tablet Commonly known as: SENOKOT Take 1 tablet by mouth in the morning and at bedtime.   Theraworx Relief Foam Apply 1 application topically daily as needed (for knee cramps).   Tyler Aas FlexTouch 100 UNIT/ML FlexTouch Pen Generic drug: insulin degludec Inject 14 units Subcutaneous Once a day 30 days What changed:  how much to take when to take this   triamcinolone cream 0.1 % Commonly known as: KENALOG Apply externally two times a day for 30 days.   UltiCare Insulin Syringe 31G X 5/16" 1 ML Misc Generic drug: Insulin Syringe-Needle U-100 USE AS DIRECTED TO INJECT       Allergies  Allergen Reactions   Duloxetine Other (See Comments)    Night sweats   Doxycycline Other (See Comments)    Severe nose bleeds   Morphine And Related Nausea And Vomiting, Swelling and Other (See Comments)    Facial swelling and lips swell, but tolerates immediate-release oxycodone as well as hydrocodone    Oxycontin [Oxycodone Hcl] Swelling and Other (See  Comments)    Lips swell, but tolerates immediate-release oxycodone as well as hydrocodone   Quinine Other (See Comments)    Platelets dropped- consumptive coagulopathy   Sertraline Other (See Comments)    Can't sleep or urinate   Voltaren [Diclofenac Sodium] Other (See Comments)    Elevated liver enzymes   Diclofenac Swelling and Other (See Comments)    Elevated liver enzymes and lips swell   Doxycycline Hyclate Other (See Comments)    Epistaxis   Duloxetine Hcl Other (See Comments)    Night sweats   Gabapentin Other (See Comments)    Reaction??   Hydrocodone Swelling and Other (See Comments)    Facial swelling- can tolerate "immediate-release" Hydrocodone, though   Oxybutynin Other (See Comments)    Swelling    Sertraline Hcl Other (See Comments)    Urinary problems and insomnia   Tape Other (See Comments)    SKIN IS FRAGILE- CERTAIN BANDAGES TEAR OFF THE SKIN    Follow-up Information     [facility] Follow up.   Specialty: Behavioral Health Why: They will send you a link half an hour before your first appointment at 2p on 10/19, Wednesday    Call if you have questions Contact information: [place] [phone]                 The results of significant diagnostics from this hospitalization (including imaging, microbiology, ancillary and laboratory) are listed below for reference.    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 Prostate cancer EXAM: CT LUMBAR SPINE WITH CONTRAST TECHNIQUE: Multidetector CT imaging of the lumbar spine was performed with intravenous contrast administration. CONTRAST:  61mL OMNIPAQUE IOHEXOL 350 MG/ML SOLN COMPARISON:  None. FINDINGS: Segmentation: Transitional lumbar anatomy with partial lumbarization of S1.  Alignment: Normal lumbar lordosis.  No listhesis. Vertebrae: No acute fracture or focal pathologic process. Paraspinal and other soft  tissues: Atherosclerotic calcification noted within the visualized aortoiliac vasculature. The paraspinal soft tissues are otherwise unremarkable. Disc levels: T12-L1: Disc space preserved. No significant neuroforaminal narrowing. No significant facet arthrosis. No canal stenosis. L1-L2: Disc space preserved. No significant facet arthrosis. No significant neuroforaminal narrowing or canal stenosis. L2-L3: Disc space preserved. No significant facet arthrosis. No significant canal stenosis or neuroforaminal narrowing. L3-L4: Mild intervertebral disc space narrowing with endplate remodeling and vacuum disc phenomena in keeping with mild to moderate degenerative disc disease. No significant facet arthrosis. No significant neuroforaminal narrowing. Mild posterior broad-based disc bulge in combination with mild hypertrophy of the lamina propria results in mild central canal stenosis. There is narrowing of the lateral recess bilaterally with possible impingement of the crossing L4 nerve roots. L4-5: Mild intervertebral disc space narrowing with slight endplate remodeling and vacuum disc phenomena in keeping with changes of moderate degenerative disc disease. Mild right facet arthrosis. Synovial hypertrophy mildly encroaches upon the inferior neural foramen without impingement of the exiting L4 nerve roots. Moderate broad-based disc bulge in combination with synovial hypertrophy and hypertrophy of the lamina propria results in marked central canal stenosis, best appreciated on axial image # 125/10 with probable impingement of the crossing L5 nerve roots. L5-S1: Vacuum disc phenomena seen in keeping with mild to moderate degenerative disc disease. Disc height has been preserved. Mild bilateral facet arthrosis. Facet hypertrophy results in left mild neuroforaminal narrowing without impingement  of the exiting L5 nerve root. Broad-based disc bulge in combination with hypertrophy of the lamina propria bilaterally results in moderate central canal stenosis, best seen on axial image # 139/10 with impingement of the crossing S1 nerve roots. IMPRESSION: No acute fracture or listhesis. No osseous metastatic disease identified. 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 At each of these levels, there is probable impingement of the crossing L5 and S1 nerve roots. Electronically Signed   By: Fidela Salisbury M.D.   On: 11/19/2020 01:19   CT ABDOMEN PELVIS W CONTRAST  Result Date: 11/19/2020 CLINICAL DATA:  Pancreatic cancer, surveillance. Increasing chronic back and abdominal pain. EXAM: CT ABDOMEN AND PELVIS WITH CONTRAST TECHNIQUE: Multidetector CT imaging of the abdomen and pelvis was performed using the standard protocol following bolus administration of intravenous contrast. CONTRAST:  66mL OMNIPAQUE IOHEXOL 350 MG/ML SOLN COMPARISON:  08/16/2020 FINDINGS: Lower chest: The visualized lung bases are clear. Visualized heart and pericardium are unremarkable. Moderate hiatal hernia. Hepatobiliary: No focal liver abnormality is seen. No gallstones, gallbladder wall thickening, or biliary dilatation. Pancreas: Surgical changes of distal pancreatectomy are identified. Normal appearance of the residual proximal pancreas. 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 Adrenals/Urinary Tract: The adrenal glands are unremarkable. Punctate left lower pole nephrolithiasis again noted. The kidneys are otherwise normal. The bladder is mildly distended. 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 There is superimposed asymmetric posterior bladder wall thickening and perivesicular inflammatory stranding involving the posterior bladder wall. This may be infectious or inflammatory in  nature. Stomach/Bowel: Intra-abdominal stomach, small bowel, and large bowel are unremarkable. Appendix normal. No free intraperitoneal gas or fluid Vascular/Lymphatic: Moderate aortoiliac atherosclerotic calcification. No aortic aneurysm. No pathologic adenopathy within the abdomen and pelvis. Reproductive: Changes of UroLift procedure are identified. Probable surgical changes of trans urethral prostate resection. The prostate is of normal size. Seminal vesicles are unremarkable. Other: Small right and moderate left fat containing inguinal hernias are present. Rectum unremarkable. Musculoskeletal: No acute bone abnormality.  No lytic or blastic bone lesions. Degenerative changes are seen within the lumbar spine. IMPRESSION: Surgical changes of distal pancreatectomy and splenectomy. No evidence of recurrent or residual disease within the abdomen and pelvis. Moderate hiatal hernia. Stable minimal left nonobstructing nephrolithiasis. No hydronephrosis. No urolithiasis. Mild bladder distension possibly related to voluntary retention or some degree of bladder outlet obstruction. Superimposed asymmetric bladder wall thickening and perivesicular inflammatory stranding, possibly infectious or inflammatory in nature. Correlation with urinalysis and urine culture may be helpful. Aortic Atherosclerosis (ICD10-I70.0). Electronically Signed   By: Fidela Salisbury M.D.   On: 11/19/2020 01:00   IR IVC FILTER PLMT / S&I Burke Keels GUID/MOD SED  Result Date: 11/25/2020 INDICATION: 75 year old male with recurrent DVT despite anticoagulation. Peripheral IV access could not be obtained. Therefore, ultrasound-guided deep vein IV placement was performed by the MD. EXAM: ULTRASOUND GUIDANCE FOR [place] IVC CATHETERIZATION AND VENOGRAM IVC FILTER INSERTION Interventional Radiologist:  Criselda Peaches, MD MEDICATIONS: None. ANESTHESIA/SEDATION: Fentanyl 100 mcg IV; Versed 2 mg IV Moderate Sedation Time:  23 minutes The patient was  continuously monitored during the procedure by the interventional radiology nurse under my direct supervision. FLUOROSCOPY TIME:  Fluoroscopy Time: 1 minutes 48 seconds (228 mGy). COMPLICATIONS: None immediate. PROCEDURE: Informed written consent was obtained from the patient after a thorough discussion of the procedural risks, benefits and alternatives. All questions were addressed. Maximal Sterile Barrier Technique was utilized including caps, mask, sterile gowns, sterile gloves, sterile drape, hand hygiene and skin antiseptic. A timeout was performed prior to the initiation of the procedure. The right brachial vein was interrogated with ultrasound and found to be widely patent. An image was obtained and stored for the medical record. Local anesthesia was attained by infiltration with 1% lidocaine. A small dermatotomy was made. Under real-time sonographic guidance, the vessel was punctured with a 21 gauge micropuncture needle. Using standard technique, the initial micro needle was exchanged over a 0.018 micro wire for a transitional 4 [place] micro sheath. The micro sheath was then converted into an IV by adding a valved capped. The IV flushes and aspirates easily. The IV was secured to the skin in standard fashion. Maximal barrier sterile technique utilized including caps, mask, sterile gowns, sterile gloves, large sterile drape, hand hygiene, and Betadine prep. Under sterile condition and local anesthesia, right internal jugular venous access was performed with ultrasound. An ultrasound image was saved and sent to PACS. Over a guidewire, the IVC filter delivery sheath and inner dilator were advanced into the IVC just above the IVC bifurcation. Contrast injection was performed for an IVC venogram. Through the delivery sheath, a retrievable [place] IVC filter was deployed below the level of the renal veins and above the IVC bifurcation. Limited post deployment venacavagram was performed. The delivery sheath was  removed and hemostasis was obtained with manual compression. A dressing was placed. The patient tolerated the procedure well without immediate post procedural complication. FINDINGS: The IVC is patent. No evidence of thrombus, stenosis, or occlusion. No variant venous anatomy. Successful placement of the IVC filter below the level of the renal veins. IMPRESSION: Successful ultrasound and fluoroscopically guided placement of an infrarenal retrievable IVC filter via right jugular approach. PLAN: Due to patient related comorbidities and/or clinical necessity, this IVC filter should be considered a permanent device. This patient will not be actively followed for future filter retrieval. Electronically Signed   By: Jacqulynn Cadet M.D.   On: 11/25/2020 13:51   US Venous Img Lower Right (DVT Study)  Result Date:  11/07/2020 CLINICAL DATA:  Right lower extremity swelling EXAM: RIGHT LOWER EXTREMITY VENOUS DOPPLER ULTRASOUND TECHNIQUE: Gray-scale sonography with graded compression, as well as color Doppler and duplex ultrasound were performed to evaluate the lower extremity deep venous systems from the level of the common femoral vein and including the common femoral, femoral, profunda femoral, popliteal and calf veins including the posterior tibial, peroneal and gastrocnemius veins when visible. The superficial great saphenous vein was also interrogated. Spectral Doppler was utilized to evaluate flow at rest and with distal augmentation maneuvers in the common femoral, femoral and popliteal veins. COMPARISON:  None. FINDINGS: Contralateral Common Femoral Vein: Respiratory phasicity is normal and symmetric with the symptomatic side. No evidence of thrombus. Normal compressibility. Common Femoral Vein: Nonocclusive thrombus with decreased compressibility Saphenofemoral Junction: No evidence of thrombus. Normal compressibility and flow on color Doppler imaging. Profunda Femoral Vein: No evidence of thrombus. Normal  compressibility and flow on color Doppler imaging. Femoral Vein: Nonocclusive thrombus Popliteal Vein: No evidence of thrombus. Normal compressibility, respiratory phasicity and response to augmentation. Calf Veins: No evidence of thrombus. Normal compressibility and flow on color Doppler imaging. Superficial Great Saphenous Vein: thrombus in the mid to distal thigh Venous Reflux:  None. Other Findings:  None. IMPRESSION: 1. Nonocclusive deep venous thrombosis in the right common femoral and femoral veins. 2. Acute superficial greater saphenous vein thrombosis in the mid to distal thigh. Electronically Signed   By: Ulyses Jarred M.D.   On: 11/07/2020 19:26   IR US Guide Vasc Access Right  Result Date: 11/25/2020 INDICATION: 75 year old male with recurrent DVT despite anticoagulation. Peripheral IV access could not be obtained. Therefore, ultrasound-guided deep vein IV placement was performed by the MD. EXAM: ULTRASOUND GUIDANCE FOR [place] IVC CATHETERIZATION AND VENOGRAM IVC FILTER INSERTION Interventional Radiologist:  Criselda Peaches, MD MEDICATIONS: None. ANESTHESIA/SEDATION: Fentanyl 100 mcg IV; Versed 2 mg IV Moderate Sedation Time:  23 minutes The patient was continuously monitored during the procedure by the interventional radiology nurse under my direct supervision. FLUOROSCOPY TIME:  Fluoroscopy Time: 1 minutes 48 seconds (228 mGy). COMPLICATIONS: None immediate. PROCEDURE: Informed written consent was obtained from the patient after a thorough discussion of the procedural risks, benefits and alternatives. All questions were addressed. Maximal Sterile Barrier Technique was utilized including caps, mask, sterile gowns, sterile gloves, sterile drape, hand hygiene and skin antiseptic. A timeout was performed prior to the initiation of the procedure. The right brachial vein was interrogated with ultrasound and found to be widely patent. An image was obtained and stored for the medical record.  Local anesthesia was attained by infiltration with 1% lidocaine. A small dermatotomy was made. Under real-time sonographic guidance, the vessel was punctured with a 21 gauge micropuncture needle. Using standard technique, the initial micro needle was exchanged over a 0.018 micro wire for a transitional 4 [place] micro sheath. The micro sheath was then converted into an IV by adding a valved capped. The IV flushes and aspirates easily. The IV was secured to the skin in standard fashion. Maximal barrier sterile technique utilized including caps, mask, sterile gowns, sterile gloves, large sterile drape, hand hygiene, and Betadine prep. Under sterile condition and local anesthesia, right internal jugular venous access was performed with ultrasound. An ultrasound image was saved and sent to PACS. Over a guidewire, the IVC filter delivery sheath and inner dilator were advanced into the IVC just above the IVC bifurcation. Contrast injection was performed for an IVC venogram. Through the delivery sheath, a retrievable Denali IVC filter was  deployed below the level of the renal veins and above the IVC bifurcation. Limited post deployment venacavagram was performed. The delivery sheath was removed and hemostasis was obtained with manual compression. A dressing was placed. The patient tolerated the procedure well without immediate post procedural complication. FINDINGS: The IVC is patent. No evidence of thrombus, stenosis, or occlusion. No variant venous anatomy. Successful placement of the IVC filter below the level of the renal veins. IMPRESSION: Successful ultrasound and fluoroscopically guided placement of an infrarenal retrievable IVC filter via right jugular approach. PLAN: Due to patient related comorbidities and/or clinical necessity, this IVC filter should be considered a permanent device. This patient will not be actively followed for future filter retrieval. Electronically Signed   By: Jacqulynn Cadet M.D.   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 Peripheral IV access could not be obtained. Therefore, ultrasound-guided deep vein IV placement was performed by the MD. EXAM: ULTRASOUND GUIDANCE FOR [place] IVC CATHETERIZATION AND VENOGRAM IVC FILTER INSERTION Interventional Radiologist:  Criselda Peaches, MD MEDICATIONS: None. ANESTHESIA/SEDATION: Fentanyl 100 mcg IV; Versed 2 mg IV Moderate Sedation Time:  23 minutes The patient was continuously monitored during the procedure by the interventional radiology nurse under my direct supervision. FLUOROSCOPY TIME:  Fluoroscopy Time: 1 minutes 48 seconds (228 mGy). COMPLICATIONS: None immediate. PROCEDURE: Informed written consent was obtained from the patient after a thorough discussion of the procedural risks, benefits and alternatives. All questions were addressed. Maximal Sterile Barrier Technique was utilized including caps, mask, sterile gowns, sterile gloves, sterile drape, hand hygiene and skin antiseptic. A timeout was performed prior to the initiation of the procedure. The right brachial vein was interrogated with ultrasound and found to be widely patent. An image was obtained and stored for the medical record. Local anesthesia was attained by infiltration with 1% lidocaine. A small dermatotomy was made. Under real-time sonographic guidance, the vessel was punctured with a 21 gauge micropuncture needle. Using standard technique, the initial micro needle was exchanged over a 0.018 micro wire for a transitional 4 [place] micro sheath. The micro sheath was then converted into an IV by adding a valved capped. The IV flushes and aspirates easily. The IV was secured to the skin in standard fashion. Maximal barrier sterile technique utilized including caps, mask, sterile gowns, sterile gloves, large sterile drape, hand hygiene, and Betadine prep.  Under sterile condition and local anesthesia, right internal jugular venous access was performed with ultrasound. An ultrasound image was saved and sent to PACS. Over a guidewire, the IVC filter delivery sheath and inner dilator were advanced into the IVC just above the IVC bifurcation. Contrast injection was performed for an IVC venogram. Through the delivery sheath, a retrievable Denali IVC filter was deployed below the level of the renal veins and above the IVC bifurcation. Limited post deployment venacavagram was performed. The delivery sheath was removed and hemostasis was obtained with manual compression. A dressing was placed. The patient tolerated the procedure well without immediate post procedural complication. FINDINGS: The IVC is patent. No evidence of thrombus, stenosis, or occlusion. No variant venous anatomy. Successful placement of the IVC filter below the level of the renal veins. IMPRESSION: Successful ultrasound and fluoroscopically guided placement of an infrarenal retrievable IVC filter via right jugular approach. PLAN: Due to patient related comorbidities and/or clinical necessity, this IVC filter should be considered a permanent device. This patient will not be actively followed  for future filter retrieval. Electronically Signed   By: Jacqulynn Cadet M.D.   On: 11/25/2020 13:51    Microbiology: No results found for this or any previous visit (from the past 240 hour(s)).   Labs: Basic Metabolic Panel: Recent Labs  Lab 11/25/20 0511 11/27/20 0924  NA 132* 133*  K 4.0 4.2  CL 106 103  CO2 20* 22  GLUCOSE 229* 305*  BUN 25* 28*  CREATININE 1.02 1.29*  CALCIUM 10.6* 10.9*   Liver Function Tests: Recent Labs  Lab 11/27/20 0924  AST 15  ALT 16  ALKPHOS 75  BILITOT 0.5  PROT 6.8  ALBUMIN 3.8   No results for input(s): LIPASE, AMYLASE in the last 168 hours. No results for input(s): AMMONIA in the last 168 hours. CBC: Recent Labs  Lab 11/25/20 0511  WBC 7.5   NEUTROABS 2.8  HGB 11.7*  HCT 34.8*  MCV 90.2  PLT 438*   Cardiac Enzymes: No results for input(s): CKTOTAL, CKMB, CKMBINDEX, TROPONINI in the last 168 hours. BNP: BNP (last 3 results) No results for input(s): BNP in the last 8760 hours.  ProBNP (last 3 results) No results for input(s): PROBNP in the last 8760 hours.  CBG: Recent Labs  Lab 11/28/20 1946 11/29/20 0758 11/29/20 1150 11/29/20 1659 11/29/20 2005  GLUCAP 369* 244* 244* 244* 307*       Signed:  Cristal Deer, MD [facility] 11/30/2020, 3:30 PM

## 2020-11-30 NOTE — Consult Note (Signed)
[facility] Consult   Reason for Consult:  Suicide attempt Referring Physician:  Dr. Kyung Bacca Patient Identification: Derek [name] MRN:  [id number] Principal Diagnosis: <principal problem not specified> Diagnosis:  Active Problems:   Obstructive sleep apnea   Poorly controlled type 2 diabetes mellitus (Derek [name])   Benign essential HTN   Protein-calorie malnutrition, moderate (HCC)   Deep venous thrombosis (HCC)   Overdose, intentional self-harm, initial encounter (Derek [name])   Intentional opiate overdose (Derek [name])   Chronic back pain   Constipation due to opioid therapy   Total Time spent with patient: 45 minutes  Subjective:   Derek [name] is a 75 y.o. male patient admitted with suicide attempt by overdose on 800mg  of Hydromorphone, in an attempt to end his life.  Patient has been medically stable for approximately 14 days, however due to his medical complexities he has been denied at several local and state geriatric psychiatric facilities.  Although presented with a suicide attempt of high lethality, his behavioral health team has been able to modify some of his risk factors during this hospitalization.  He has been started on quetiapine 25 mg p.o. nightly, for mood stabilization.  He has also transition successfully to methadone as he previously overdosed on hydromorphone.  And his mood has improved somewhat, as he is no longer withdrawn and shows some willingness to engage with his interdisciplinary team.    On today's evaluation patient is observed to be sitting on the side of the bed, manicuring his nails.  He is able to make good eye contact, his speech is normal, and fluency is normal.  His mood is irritable, yet willingness to cooperate and engages well with this Probation officer.  We again reviewed his disposition planning, and which he states he will likely leave home for a little while, as he is able to discuss his wife needing space and time apart.  He also plans to use this time as a  self reflection, and continue to work on himself and his attitude.  He denies any suicidal ideations, suicidal thoughts, self-harm injurious behaviors, and or suicidal statements.  Patient has not displayed or exhibited any disruptive behaviors in over 5 days.  Patient at present does not appear to be at acute risk of harm to self, has no other history of suicide attempts over 75 years, and this sole suicide attempt was a stress reaction seeking attention by family members only. Although this patient presented to the hospital for suicide attempt, he does not appear to be at imminent risk of dangerousness to self and dangerousness to others at this time. While future psychiatric events cannot be accurately predicted, the patient does not necessitate nor desire further acute inpatient psychiatric care at this time. There is a possibility that he would benefit from an inpatient hospital stay to learn additional coping skills and provide structure and safety outside of the context of his psychosocial stressors; however, this must be weighed against the patient's denial of dangerousness and his lack of desire for voluntary admission. The patient and patient's family weighed the risks and benefits of an inpatient admission and voiced understanding of the risks involved in returning to home. The patient, patient's family  responded well to supportive and problem solving therapeutic interventions.   While this patient presents with risk factors that include male age 35 years and older, agitation, suicidal attempts, chronic irritability, Caucasian male, impulsivity, chronic poor judgment and rigid thinking,  these are mitigated by protective factors which include no know access  to weapons or firearms(have been removed by family), no history of previous suicide attempts , no history of violence, supportive family, presence of a significant relationship, presence of an available support system, current treatment  compliance, support system in agreement with treatment recommendations and presence of a safety plan with follow-up care.    HPI:  75 y/o gentleman with a history of chronic pain involving his lower back shoulder knees and abdomen.  Also has a history of pancreatic cancer status post pancreatectomy splenectomy, history of DVT on apixaban, history of factor V Leyden mutation.  He is also seen by pain management specialist.  He is on dilaudid at home.  He presented with uncontrolled chronic pain and intentional overdose. He took >100 pills of dilaudid 8mg . His home dose is 8mg  up to 7 times per day as needed. He felt like his pain was too uncontrolled, but didn't feel better after his overdose. He presented to the ED with cool clammy skin and miosis. He was started on a narcan infusion in the ED with a goal of maintaining adequate respirations.  He was stabilized and then transferred to the hospitalist service.     Past Psychiatric History: MDD, Delirium. No current outpatient psychiatric services.   Risk to Self:  Yes Risk to Others:   Denies Prior Inpatient Therapy:  Denies Prior Outpatient Therapy:   Denies  Past Medical History:  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" no pulmonary md, sees dr Derek [name]   Factor V Leiden mutation ([place])    "never had blood clots"   GERD (gastroesophageal reflux disease)    Hiatal hernia    History of kidney stones    multiple kidney stones-not a problem at present   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, pT1 pNX)  no further treatment   History of panic attacks    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    Past Surgical History:  Procedure Laterality Date   ANAL FISTULECTOMY N/A 04/01/2013   Procedure: EXAM UNDER ANESTHESIA WITH ANAL FISSURectomy, sphincterotomy and internal hemorrhoidectomy;  Surgeon: Harl Bowie, MD;  Location:  [place];  Service: General;  Laterality: N/A;   APPLICATION OF WOUND VAC N/A 05/16/2015   Procedure: APPLICATION OF WOUND VAC;  Surgeon: Armandina Gemma, MD;  Location: [facility];  Service: General;  Laterality: N/A;   APPLICATION OF WOUND VAC N/A 05/20/2015   Procedure: EXHANGE OF ABDOMINAL  WOUND VAC DRESSING;  Surgeon: Armandina Gemma, MD;  Location: [facility];  Service: General;  Laterality: N/A;   COLONOSCOPY     CYSTOSCOPY WITH INSERTION OF UROLIFT N/A 10/12/2016   Procedure: CYSTOSCOPY WITH INSERTION OF UROLIFT;  Surgeon: Franchot Gallo, MD;  Location: [facility];  Service: Urology;  Laterality: N/A;   EUS N/A 12/31/2014   Procedure: UPPER ENDOSCOPIC ULTRASOUND (EUS) LINEAR;  Surgeon: Milus Banister, MD;  Location: [facility];  Service: Endoscopy;  Laterality: N/A;   HARDWARE REMOVAL Left 2013   left ankle   HEMORRHOIDECTOMY WITH HEMORRHOID BANDING     INCISION  AND DRAINAGE ABSCESS N/A 05/14/2015   Procedure: DRAINAGE OF INFECTED PANCREATIC PSEUDOCYST;  Surgeon: Armandina Gemma, MD;  Location: [facility];  Service: General;  Laterality: N/A;   INCISION AND DRAINAGE OF WOUND N/A 05/16/2015   Procedure: IRRIGATION AND DEBRIDEMENT WOUND;  Surgeon: Armandina Gemma, MD;  Location: [facility];  Service: General;  Laterality: N/A;   INCISIONAL HERNIA REPAIR N/A 11/12/2015   Procedure: REPAIR INCISIONAL HERNIA WITH MESH;  Surgeon: Armandina Gemma, MD;  Location: [place];  Service: General;  Laterality: N/A;   INGUINAL HERNIA REPAIR Right 1974;  [place] N/A 11/12/2015   Procedure: INSERTION OF MESH;  Surgeon: Armandina Gemma, MD;  Location: [place];  Service: General;  Laterality: N/A;   IR GENERIC HISTORICAL  04/20/2015   IR RADIOLOGIST EVAL & MGMT 04/20/2015 [facility] INTERV RAD   IR IVC FILTER PLMT / S&I Burke Keels GUID/MOD SED  11/25/2020   IR RADIOLOGY PERIPHERAL GUIDED IV START  11/25/2020   IR US GUIDE VASC ACCESS RIGHT  11/25/2020   KNEE ARTHROSCOPY Bilateral right 08-08-2000;  left 11-23-2000   meniscal repair and chondroplasty's   KNEE ARTHROSCOPY  03/01/2012   Procedure: ARTHROSCOPY KNEE;  Surgeon: Gearlean Alf, MD;  Location: [facility];  Service: Orthopedics;  Laterality: Left;  WITH SYNOVECTOMY    LAPAROTOMY N/A 05/14/2015   Procedure: EXPLORATORY LAPAROTOMY;  Surgeon: Armandina Gemma, MD;  Location: [facility];  Service: General;  Laterality: N/A;   MASS EXCISION N/A 12/24/2019   Procedure: EXCISION LIP CARCINOMA;  Surgeon: Leta Baptist, MD;  Location: [place];  Service: ENT;  Laterality: N/A;   ORIF LEFT ANKLE FX  08/20/2009   distal tib-fib and malleolus   ROTATOR CUFF REPAIR Bilateral right 1994; left 1993, left 1993   TOTAL KNEE ARTHROPLASTY Right 06/02/2013   Procedure: RIGHT TOTAL KNEE ARTHROPLASTY;  Surgeon: Gearlean Alf, MD;  Location: [facility];  Service: Orthopedics;  Laterality: Right;   TOTAL KNEE ARTHROPLASTY Left 01-31-2008   dr Wynelle Link   TOTAL KNEE  REVISION Right 01/23/2018   Procedure: right knee polyethylene revision;  Surgeon: Gaynelle Arabian, MD;  Location: [facility];  Service: Orthopedics;  Laterality: Right;  53min   TRANSURETHRAL RESECTION OF PROSTATE N/A 04/21/2019   Procedure: TRANSURETHRAL RESECTION OF THE PROSTATE (TURP);  Surgeon: Franchot Gallo, MD;  Location: [facility];  Service: Urology;  Laterality: N/A;   UMBILICAL HERNIA REPAIR  08-11-1999   dr Ninfa Linden   UPPER GASTROINTESTINAL ENDOSCOPY  sept 2016   WOUND EXPLORATION N/A 09/21/2016  Procedure: WOUND EXPLORATION AND DEBRIDEMENT ABDOMINAL WALL;  Surgeon: Armandina Gemma, MD;  Location[facility];  Service: General;  Laterality: N/A;   Family History:  Family History  Problem Relation Age of Onset   Bone cancer Father        Jaw   Factor V Leiden deficiency Daughter    Diabetes Maternal Grandmother    Heart disease Paternal Uncle    Colon cancer Neg Hx    Esophageal cancer Neg Hx    Stomach cancer Neg Hx    Rectal cancer Neg Hx    Family Psychiatric  History: zDenies Social History:  Social History   Substance and Sexual Activity  Alcohol Use No   Alcohol/week: 0.0 standard drinks     Social History   Substance and Sexual Activity  Drug Use No    Social History   Socioeconomic History   Marital status: Married    Spouse name: Ivin Booty   Number of children: 2   Years of education: Not on file   Highest education level: Not on file  Occupational History   Occupation: retired   Tobacco Use   Smoking status: Former    Packs/day: 4.00    Years: 17.00    Pack years: 68.00    Types: Cigarettes    Quit date: 02/21/1975    Years since quitting: 45.8   Smokeless tobacco: Never  Vaping Use   Vaping Use: Never used  Substance and Sexual Activity   Alcohol use: No    Alcohol/week: 0.0 standard drinks   Drug use: No   Sexual activity: Yes  Other Topics Concern   Not on file  Social History Narrative   Married, retired Scientist, product/process development   1 son 1  daughter   2 caffeine drinks/day   10/18/2014   Social Determinants of Health   Financial Resource Strain: Not on file  Food Insecurity: No Food Insecurity   Worried About Charity fundraiser in the Last Year: Never true   [place] in the Last Year: Never true  Transportation Needs: No Transportation Needs   Lack of Transportation (Medical): No   Lack of Transportation (Non-Medical): No  Physical Activity: Not on file  Stress: Not on file  Social Connections: Not on file   Additional Social History:    Allergies:   Allergies  Allergen Reactions   Duloxetine Other (See Comments)    Night sweats   Doxycycline Other (See Comments)    Severe nose bleeds   Morphine And Related Nausea And Vomiting, Swelling and Other (See Comments)    Facial swelling and lips swell, but tolerates immediate-release oxycodone as well as hydrocodone    Oxycontin [Oxycodone Hcl] Swelling and Other (See Comments)    Lips swell, but tolerates immediate-release oxycodone as well as hydrocodone   Quinine Other (See Comments)    Platelets dropped- consumptive coagulopathy   Sertraline Other (See Comments)    Can't sleep or urinate   Voltaren [Diclofenac Sodium] Other (See Comments)    Elevated liver enzymes   Diclofenac Swelling and Other (See Comments)    Elevated liver enzymes and lips swell   Doxycycline Hyclate Other (See Comments)    Epistaxis   Duloxetine Hcl Other (See Comments)    Night sweats   Gabapentin Other (See Comments)    Reaction??   Hydrocodone Swelling and Other (See Comments)    Facial swelling- can tolerate "immediate-release" Hydrocodone, though   Oxybutynin Other (See Comments)    Swelling  Sertraline Hcl Other (See Comments)    Urinary problems and insomnia   Tape Other (See Comments)    SKIN IS FRAGILE- CERTAIN BANDAGES TEAR OFF THE SKIN    Labs:  Results for orders placed or performed during the hospital encounter of 11/17/20 (from the past 48 hour(s))   Glucose, capillary     Status: Abnormal   Collection Time: 11/28/20 11:24 AM  Result Value Ref Range   Glucose-Capillary 189 (H) 70 - 99 mg/dL    Comment: Glucose reference range applies only to samples taken after fasting for at least 8 hours.  Glucose, capillary     Status: Abnormal   Collection Time: 11/28/20  4:19 PM  Result Value Ref Range   Glucose-Capillary 332 (H) 70 - 99 mg/dL    Comment: Glucose reference range applies only to samples taken after fasting for at least 8 hours.  Glucose, capillary     Status: Abnormal   Collection Time: 11/28/20  7:46 PM  Result Value Ref Range   Glucose-Capillary 369 (H) 70 - 99 mg/dL    Comment: Glucose reference range applies only to samples taken after fasting for at least 8 hours.  Glucose, capillary     Status: Abnormal   Collection Time: 11/29/20  7:58 AM  Result Value Ref Range   Glucose-Capillary 244 (H) 70 - 99 mg/dL    Comment: Glucose reference range applies only to samples taken after fasting for at least 8 hours.  Glucose, capillary     Status: Abnormal   Collection Time: 11/29/20 11:50 AM  Result Value Ref Range   Glucose-Capillary 244 (H) 70 - 99 mg/dL    Comment: Glucose reference range applies only to samples taken after fasting for at least 8 hours.   Comment 1 Notify RN   Glucose, capillary     Status: Abnormal   Collection Time: 11/29/20  4:59 PM  Result Value Ref Range   Glucose-Capillary 244 (H) 70 - 99 mg/dL    Comment: Glucose reference range applies only to samples taken after fasting for at least 8 hours.   Comment 1 Notify RN   Glucose, capillary     Status: Abnormal   Collection Time: 11/29/20  8:05 PM  Result Value Ref Range   Glucose-Capillary 307 (H) 70 - 99 mg/dL    Comment: Glucose reference range applies only to samples taken after fasting for at least 8 hours.    Current Facility-Administered Medications  Medication Dose Route Frequency Provider Last Rate Last Admin   acetaminophen (TYLENOL)  tablet 650 mg  650 mg Oral Q6H PRN [name], MD   650 mg at 11/27/20 0900   apixaban (ELIQUIS) tablet 5 mg  5 mg Oral BID [name], DO   5 mg at 11/29/20 2118   bisacodyl (DULCOLAX) EC tablet 5 mg  5 mg Oral Daily PRN [name], MD       calcium carbonate (TUMS - dosed in mg elemental calcium) chewable tablet 200 mg of elemental calcium  1 tablet Oral BID PRN [name] P, DO   200 mg of elemental calcium at 11/18/20 1617   carvedilol (COREG) tablet 6.25 mg  6.25 mg Oral QHS [name], MD   6.25 mg at 11/29/20 2118   furosemide (LASIX) tablet 20 mg  20 mg Oral Daily PRN [name] P, DO       glucagon (human recombinant) (GLUCAGEN) injection 1 mg  1 mg Intravenous Once PRN [name], DO  guaiFENesin-dextromethorphan (ROBITUSSIN DM) 100-10 MG/5ML syrup 5 mL  5 mL Oral Q8H PRN Cristal Deer, MD   5 mL at 11/30/20 0242   haloperidol lactate (HALDOL) injection 5 mg  5 mg Intramuscular Q6H PRN Vashti Hey, MD   5 mg at 11/25/20 1705   hydrOXYzine (ATARAX/VISTARIL) tablet 10 mg  10 mg Oral TID PRN [name] P, DO   10 mg at 11/22/20 1624   insulin aspart (novoLOG) injection 0-20 Units  0-20 Units Subcutaneous TID WC [name], MD   5 Units at 11/29/20 1717   insulin aspart (novoLOG) injection 0-5 Units  0-5 Units Subcutaneous QHS [name], MD   4 Units at 11/29/20 2234   insulin aspart (novoLOG) injection 4 Units  4 Units Subcutaneous TID WC [name], MD   1 Units at 11/27/20 0902   insulin glargine-yfgn (SEMGLEE) injection 14 Units  14 Units Subcutaneous Daily Annita Brod, MD       lidocaine (LIDODERM) 5 % 1 patch  1 patch Transdermal Q24H Lovey Newcomer T, NP   1 patch at 11/25/20 9892   losartan (COZAAR) tablet 25 mg  25 mg Oral Daily [name], MD   25 mg at 11/29/20 1194   magnesium oxide (MAG-OX) tablet 800 mg  800 mg Oral QHS Chotiner, Yevonne Aline, MD   800 mg at 11/29/20 2118   MEDLINE mouth rinse  15 mL  Mouth Rinse BID [name], DO   15 mL at 11/29/20 1740   melatonin tablet 3 mg  3 mg Oral q1800 Cinderella, Margaret A   3 mg at 11/29/20 1717   methadone (DOLOPHINE) tablet 5 mg  5 mg Oral TID [name], MD   5 mg at 11/29/20 2118   pantoprazole (PROTONIX) EC tablet 20 mg  20 mg Oral Daily [name] P, DO   20 mg at 11/29/20 8144   polyethylene glycol (MIRALAX / GLYCOLAX) packet 17 g  17 g Oral TID [name], MD   17 g at 11/29/20 2119   pravastatin (PRAVACHOL) tablet 20 mg  20 mg Oral Once per day on Mon Thu [name], Laura P, DO   20 mg at 11/29/20 8185   pregabalin (LYRICA) capsule 25 mg  25 mg Oral QHS Cristal Generous, NP   25 mg at 11/29/20 2118   promethazine (PHENERGAN) 6.25 mg in sodium chloride 0.9 % 50 mL IVPB  6.25 mg Intravenous Q6H PRN [name], DO   Stopped at 11/17/20 1007   QUEtiapine (SEROQUEL) tablet 25 mg  25 mg Oral QHS Suella Broad, FNP   25 mg at 11/29/20 2118   senna-docusate (Senokot-S) tablet 2 tablet  2 tablet Oral BID [name], MD   2 tablet at 11/29/20 2118   Theraworx Relief LIQD 1 application  1 application Apply externally Daily PRN Bowser, Laurel Dimmer, NP        Musculoskeletal: Strength & Muscle Tone: within normal limits Gait & Station: normal Patient leans: N/A    Psychiatric Specialty Exam:  Presentation  General Appearance: Appropriate for Environment; Casual  Eye Contact:Fair  Speech:Clear and Coherent; Normal Rate  Speech Volume:Normal  Handedness:Right   Mood and Affect  Mood:Irritable  Affect:Appropriate; Congruent   Thought Process  Thought Processes:Coherent; Goal Directed  Descriptions of Associations:Intact  Orientation:Full (Time, Place and Person)  Thought Content:Logical  History of Schizophrenia/Schizoaffective disorder:No data recorded Duration of Psychotic Symptoms:No data recorded Hallucinations:Hallucinations: None  Ideas of Reference:None  Suicidal Thoughts:Suicidal  Thoughts: No  Homicidal Thoughts:Homicidal Thoughts: No   Sensorium  Memory:Immediate Fair; Recent Fair; Remote Fair  Judgment:Fair  Insight:Fair   Executive Functions  Concentration:Fair  Attention Span:Fair  [place]   Psychomotor Activity  Psychomotor Activity:Psychomotor Activity: Normal   Assets  Assets:Desire for Improvement; Armed forces logistics/support/administrative officer; Leisure Time; Physical Health; Resilience; Financial Resources/Insurance; Social Support   Sleep  Sleep:Sleep: Fair   Physical Exam: Physical Exam ROS Blood pressure 128/67, pulse 75, temperature 98.1 F (36.7 C), resp. rate 20, SpO2 98 %. There is no height or weight on file to calculate BMI.  See above for suicide risk evaluation.  Treatment Plan Summary: Original recommendation for inpatient psychiatric admission to geriatric facility, has been complicated by patient's multiple medical comorbidities to include chronic pain management, recent placement of IVC filter during hospitalization.  As a result patient has been stabilized and awaiting placement at a facility for approximately 14 days.  Although patient continues to have daily irritability, he appears to be more in his baseline and will be psychiatrically cleared today.  Patient has been managed with quetiapine 25 mg p.o. nightly for mood stabilization, will likely need to increase in an outpatient setting.  -His pain is now being managed with methadone 5 mg p.o. 3 times daily, and will also need to follow-up with outpatient pain management.  Due to patient's ongoing mood lability and chronic irritability, will coordinate appointments and appropriate transition of care to intensive outpatient program, psychiatry, and individual therapy.  Please refer to AVS for the services that have been coordinated by his Education officer, museum.   Disposition: No evidence of imminent risk to self or others at present.   Patient does not  meet criteria for psychiatric inpatient admission. Supportive therapy provided about ongoing stressors. Refer to IOP. Discussed crisis plan, support from social network, calling 911, coming to the Emergency Department, and calling Suicide Hotline.  Suella Broad, FNP 11/30/2020 11:14 AM

## 2020-11-30 NOTE — TOC Transition Note (Signed)
Transition of Care [facility]) - CM/SW Discharge Note   Patient Details  Name: FENDER HERDER MRN: [id number] Date of Birth: 05/23/1945  [facility] CM/SW Contact:  Trish Mage, LCSW Phone Number: 11/30/2020, 2:20 PM   Clinical Narrative:   Patient who has been cleared by psychiatry will return home today.  He will follow up with [facility].  I spoke with his daughter both about the follow up plan, which patient agreed to "if I don't leave immediately to move out [place]," and about need for follow up with pain management clinic.  She stated her mother, patient's wife, is getting an appointment on the books today.  Daughter confirmed she will be here at [place] to pick up her father.  TOC sign off.    Final next level of care: Other (comment) (Home with mental health partial hospitalization program-virtual) Barriers to Discharge: Barriers Resolved   Patient Goals and CMS Choice Patient states their goals for this hospitalization and ongoing recovery are:: to go home      Discharge Placement                       Discharge Plan and Services   Discharge Planning Services: CM Consult                                 Social Determinants of Health (SDOH) Interventions     Readmission Risk Interventions No flowsheet data found.

## 2020-11-30 NOTE — Progress Notes (Addendum)
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  Patient is followed by pain management specialist he was said to be receiving outpatient ketamine for pain with significant relief however it was put on hold due to his latest DVT he had been placed on Dilaudid which is not helping his pain as a result he took overdose of 100 tablets ended up in the emergency room admitted ICU and not released to hospitalist service he received on Narcan infusion.  He is currently being seen by psychiatry and palliative care Overnight patient became disoriented confused and very aggressive had to be put on restraints.  Subjective November 26, 2020 Patient seen and examined at bedside he is on tube point soft restraints he wants to get out of the restraints he did not understand why he is on restraints.  He stated that he wanted to go home.  But upon further discussion he stated that his wife has put him out he does not have a place to go now.  November 27, 2020: Patient seen and examined at bedside he is one-on-one sitter is still present And he is in a much better mood he had not been off his restraints and was walking down the hallway with his walker and the sitter.  He denies any new complaints Still waiting for placement to [place] [facility]  November 28, 2020: Patient seen and examined at bedside He is off his restraint.  He is starting to get agitated and upset because his wife has not talked to him or come to visit him he removed his ring he said he was going to give his wedding ring back today his wife and he just wants to get out of here and find hotel room and then find  a place for him to stay and that he may not discontinue his ketamine treatment  November 29, 2020: Patient seen and examined at bedside he is calm but stated that he was starting to get upset because he still here and he really wants to go home all I want to do is to go home he said.  I have been behaving myself" With palliative and psychiatry have been involved in care they feel there is no need for continuing IVC Make an arrangement for outpatient follow-up. Patient likely be discharged tomorrow  Assessment/Plan: Active Problems:   Obstructive sleep apnea   Poorly controlled type 2 diabetes mellitus (HCC)   Benign essential HTN   Protein-calorie malnutrition, moderate (HCC)   Deep venous thrombosis (HCC)   Overdose, intentional self-harm, initial encounter ([place])   Intentional opiate overdose ([place])   Chronic back pain   Constipation due to opioid therapy  #1 DVT/hypercoagulable state/factor V Leyden:Doppler on 11/07/2020 revealed nonocclusive deep venous thrombus in the right common femoral and femoral veins with acute superficial greater saphenous vein thrombosis in the mid to distal thigh.   He was on Eliquis.  But underwent IVC filter because of his recurrent DVT  2.  Chronic pain with spinal stenosis chronic back pain.  Patient was seen by palliative care to recommend methadone but as outpatient she he was on ketamine which was stopped due to his DVT.  Now he has received IVC filter and the question is whether he needs to continue DVT pain management with ketamine. No further intervention necessary for spinal stenosis per neurosurgery Dr. Trenton Gammon  3.  Overdose which might be unintentional versus intentional Patient is IVCed  4.  Chronic atrial fibrillation on Eliquis and Coreg  5.  Constipation due to opioid Continue current bowel regimen  6.  Depression agitation aggressive labile mood Being followed by psychiatry Patient involuntarily committed on November 20, 2020 Possibly  transfer to [facility] bed  7.  Type 2 diabetes mellitus uncontrolled.  Patient has been refusing point-of-care glucose monitoring  8.  Hypercalciuric: Longstanding asymptomatic hypercalcemia  9.  Mild hyponatremia we will continue to monitor  10.  Chronic kidney disease.  His creatinine today is 2 9.  We will encouraged to continue hydration  Code Status: Full  Severity of Illness: The appropriate patient status for this patient is INPATIENT. Inpatient status is judged to be reasonable and necessary in order to provide the required intensity of service to ensure the patient's safety. The patient's presenting symptoms, physical exam findings, and initial radiographic and laboratory data in the context of their chronic comorbidities is felt to place them at high risk for further clinical deterioration. Furthermore, it is not anticipated that the patient will be medically stable for discharge from the hospital within 2 midnights of admission. The following factors support the patient status of inpatient.   " The patien his IVC, waiting for Geri psych   * I certify that at the point of admission it is my clinical judgment that the patient will require inpatient hospital care spanning beyond 2 midnights from the point of admission due to high intensity of service, high risk for further deterioration and high frequency of surveillance required.*   Family Communication: None at bedside  Disposition Plan:   Status is: Inpatient   Dispo: The patient is from: Home              Anticipated d/c is to: Possibly home              Anticipated d/c date is: November 30, 2020              Patient currently not medically stable for discharge  Consultants: Palliative care Psychiatry Interventional radiology  Procedures:  RIGHT IVC filter placement 10/6  Antimicrobials: None  DVT prophylaxis: Eliquis   Objective: Vitals:   11/29/20 0357 11/29/20 0835 11/29/20 1409 11/29/20 2006  BP:  127/67 108/77 128/79 128/67  Pulse: 60 73 73 75  Resp: 20 20 17 20   Temp: 97.7 F (36.5 C) 98.4 F (36.9 C) 98.7 F (37.1 C) 98.1 F (36.7 C)  TempSrc: Axillary Oral Oral   SpO2: 99% 97% 99% 98%    Intake/Output Summary (Last 24 hours) at 11/30/2020 1538 Last data filed at 11/30/2020 0900 Gross per 24 hour  Intake 240 ml  Output --  Net 240 ml    There were no vitals filed for this visit. There is no height or weight on file to calculate BMI.  Exam:  General: 75 y.o. year-old male well developed well nourished in no acute distress.  Alert and oriented x3. Cardiovascular: Regular rate and rhythm with no rubs or gallops.  No thyromegaly or JVD noted.   Respiratory: Clear to auscultation with no wheezes or rales. Good inspiratory effort. Abdomen: Soft nontender nondistended with normal bowel sounds x4 quadrants. Musculoskeletal: No lower extremity edema. 2/4 pulses in all 4  extremities. Skin: No ulcerative lesions noted or rashes, Psychiatry: Mood is appropriate for condition and setting somewhat pleasant     Data Reviewed: CBC: Recent Labs  Lab 11/25/20 0511  WBC 7.5  NEUTROABS 2.8  HGB 11.7*  HCT 34.8*  MCV 90.2  PLT 438*    Basic Metabolic Panel: Recent Labs  Lab 11/25/20 0511 11/27/20 0924  NA 132* 133*  K 4.0 4.2  CL 106 103  CO2 20* 22  GLUCOSE 229* 305*  BUN 25* 28*  CREATININE 1.02 1.29*  CALCIUM 10.6* 10.9*    GFR: CrCl cannot be calculated (Unknown ideal weight.). Liver Function Tests: Recent Labs  Lab 11/27/20 0924  AST 15  ALT 16  ALKPHOS 75  BILITOT 0.5  PROT 6.8  ALBUMIN 3.8    No results for input(s): LIPASE, AMYLASE in the last 168 hours. No results for input(s): AMMONIA in the last 168 hours. Coagulation Profile: Recent Labs  Lab 11/25/20 0511  INR 1.3*    Cardiac Enzymes: No results for input(s): CKTOTAL, CKMB, CKMBINDEX, TROPONINI in the last 168 hours. BNP (last 3 results) No results for input(s): PROBNP in the  last 8760 hours. HbA1C: No results for input(s): HGBA1C in the last 72 hours. CBG: Recent Labs  Lab 11/28/20 1946 11/29/20 0758 11/29/20 1150 11/29/20 1659 11/29/20 2005  GLUCAP 369* 244* 244* 244* 307*    Lipid Profile: No results for input(s): CHOL, HDL, LDLCALC, TRIG, CHOLHDL, LDLDIRECT in the last 72 hours. Thyroid Function Tests: No results for input(s): TSH, T4TOTAL, FREET4, T3FREE, THYROIDAB in the last 72 hours. Anemia Panel: No results for input(s): VITAMINB12, FOLATE, FERRITIN, TIBC, IRON, RETICCTPCT in the last 72 hours. Urine analysis:    Component Value Date/Time   COLORURINE YELLOW (A) 11/17/2020 2230   APPEARANCEUR CLEAR (A) 11/17/2020 2230   LABSPEC 1.020 11/17/2020 2230   PHURINE 6.0 11/17/2020 2230   GLUCOSEU >1,000 (A) 11/17/2020 2230   HGBUR LARGE (A) 11/17/2020 2230   BILIRUBINUR NEGATIVE 11/17/2020 2230   BILIRUBINUR negative 07/04/2011 1902   KETONESUR NEGATIVE 11/17/2020 2230   PROTEINUR 100 (A) 11/17/2020 2230   UROBILINOGEN 0.2 05/22/2013 0924   NITRITE POSITIVE (A) 11/17/2020 2230   LEUKOCYTESUR NEGATIVE 11/17/2020 2230   Sepsis Labs: @LABRCNTIP (procalcitonin:4,lacticidven:4)  ) No results found for this or any previous visit (from the past 240 hour(s)).     Studies: No results found.  Scheduled Meds:  apixaban  5 mg Oral BID   carvedilol  6.25 mg Oral QHS   insulin aspart  0-20 Units Subcutaneous TID WC   insulin aspart  0-5 Units Subcutaneous QHS   insulin aspart  4 Units Subcutaneous TID WC   insulin glargine-yfgn  14 Units Subcutaneous Daily   lidocaine  1 patch Transdermal Q24H   losartan  25 mg Oral Daily   magnesium oxide  800 mg Oral QHS   mouth rinse  15 mL Mouth Rinse BID   melatonin  3 mg Oral q1800   methadone  5 mg Oral TID   pantoprazole  20 mg Oral Daily   polyethylene glycol  17 g Oral TID   pravastatin  20 mg Oral Once per day on Mon Thu   pregabalin  25 mg Oral QHS   QUEtiapine  25 mg Oral QHS    senna-docusate  2 tablet Oral BID    Continuous Infusions:  promethazine (PHENERGAN) injection (IM or IVPB) Stopped (11/17/20 1007)     LOS: 13 days     Cristal Deer, MD  [facility]  To reach me or the doctor on call, go to: [email or web address] Password [facility]  11/30/2020, 3:38 PM

## 2020-12-03 ENCOUNTER — Other Ambulatory Visit (HOSPITAL_BASED_OUTPATIENT_CLINIC_OR_DEPARTMENT_OTHER): Payer: Self-pay

## 2020-12-06 ENCOUNTER — Other Ambulatory Visit (HOSPITAL_BASED_OUTPATIENT_CLINIC_OR_DEPARTMENT_OTHER): Payer: Self-pay

## 2020-12-07 ENCOUNTER — Other Ambulatory Visit (HOSPITAL_BASED_OUTPATIENT_CLINIC_OR_DEPARTMENT_OTHER): Payer: Self-pay

## 2020-12-07 ENCOUNTER — Telehealth (HOSPITAL_COMMUNITY): Payer: Self-pay | Admitting: Licensed Clinical Social Worker

## 2020-12-07 DIAGNOSIS — E1165 Type 2 diabetes mellitus with hyperglycemia: Secondary | ICD-10-CM | POA: Diagnosis not present

## 2020-12-07 DIAGNOSIS — T1491XD Suicide attempt, subsequent encounter: Secondary | ICD-10-CM | POA: Diagnosis not present

## 2020-12-07 DIAGNOSIS — I10 Essential (primary) hypertension: Secondary | ICD-10-CM | POA: Diagnosis not present

## 2020-12-07 DIAGNOSIS — E119 Type 2 diabetes mellitus without complications: Secondary | ICD-10-CM | POA: Diagnosis not present

## 2020-12-07 DIAGNOSIS — M549 Dorsalgia, unspecified: Secondary | ICD-10-CM | POA: Diagnosis not present

## 2020-12-07 DIAGNOSIS — Z09 Encounter for follow-up examination after completed treatment for conditions other than malignant neoplasm: Secondary | ICD-10-CM | POA: Diagnosis not present

## 2020-12-07 DIAGNOSIS — F339 Major depressive disorder, recurrent, unspecified: Secondary | ICD-10-CM | POA: Diagnosis not present

## 2020-12-07 MED ORDER — INSULIN ASPART 100 UNIT/ML IJ SOLN
INTRAMUSCULAR | 3 refills | Status: DC
  Filled 2020-12-07: qty 50, 84d supply, fill #0
  Filled 2021-04-11: qty 50, 84d supply, fill #1

## 2020-12-07 MED ORDER — FREESTYLE LIBRE 2 SENSOR MISC
3 refills | Status: DC
  Filled 2020-12-07: qty 6, 84d supply, fill #0
  Filled 2021-03-23: qty 6, 84d supply, fill #1
  Filled 2021-06-22: qty 6, 84d supply, fill #2

## 2020-12-08 ENCOUNTER — Other Ambulatory Visit (HOSPITAL_BASED_OUTPATIENT_CLINIC_OR_DEPARTMENT_OTHER): Payer: Self-pay

## 2020-12-08 ENCOUNTER — Ambulatory Visit (HOSPITAL_COMMUNITY): Payer: HMO

## 2020-12-09 ENCOUNTER — Other Ambulatory Visit (HOSPITAL_BASED_OUTPATIENT_CLINIC_OR_DEPARTMENT_OTHER): Payer: Self-pay

## 2020-12-09 MED ORDER — CLINDAMYCIN HCL 150 MG PO CAPS
ORAL_CAPSULE | ORAL | 0 refills | Status: DC
  Filled 2020-12-09: qty 22, 7d supply, fill #0

## 2020-12-10 ENCOUNTER — Emergency Department (HOSPITAL_BASED_OUTPATIENT_CLINIC_OR_DEPARTMENT_OTHER): Payer: HMO

## 2020-12-10 ENCOUNTER — Emergency Department (HOSPITAL_BASED_OUTPATIENT_CLINIC_OR_DEPARTMENT_OTHER)
Admission: EM | Admit: 2020-12-10 | Discharge: 2020-12-10 | Disposition: A | Discharge status: Home / Self Care | Payer: HMO | Attending: Emergency Medicine | Admitting: Emergency Medicine

## 2020-12-10 ENCOUNTER — Emergency Department: Admission: EM | Admit: 2020-12-10 | Discharge: 2020-12-10 | Discharge status: Against Medical Advice | Payer: Self-pay

## 2020-12-10 ENCOUNTER — Encounter (HOSPITAL_BASED_OUTPATIENT_CLINIC_OR_DEPARTMENT_OTHER): Payer: Self-pay

## 2020-12-10 ENCOUNTER — Other Ambulatory Visit: Payer: Self-pay

## 2020-12-10 DIAGNOSIS — E119 Type 2 diabetes mellitus without complications: Secondary | ICD-10-CM | POA: Insufficient documentation

## 2020-12-10 DIAGNOSIS — Z7901 Long term (current) use of anticoagulants: Secondary | ICD-10-CM | POA: Diagnosis not present

## 2020-12-10 DIAGNOSIS — Z794 Long term (current) use of insulin: Secondary | ICD-10-CM | POA: Diagnosis not present

## 2020-12-10 DIAGNOSIS — I10 Essential (primary) hypertension: Secondary | ICD-10-CM | POA: Diagnosis not present

## 2020-12-10 DIAGNOSIS — R1030 Lower abdominal pain, unspecified: Secondary | ICD-10-CM | POA: Diagnosis not present

## 2020-12-10 DIAGNOSIS — Z96653 Presence of artificial knee joint, bilateral: Secondary | ICD-10-CM | POA: Diagnosis not present

## 2020-12-10 DIAGNOSIS — M1611 Unilateral primary osteoarthritis, right hip: Secondary | ICD-10-CM | POA: Diagnosis not present

## 2020-12-10 DIAGNOSIS — Z79899 Other long term (current) drug therapy: Secondary | ICD-10-CM | POA: Insufficient documentation

## 2020-12-10 DIAGNOSIS — R600 Localized edema: Secondary | ICD-10-CM | POA: Diagnosis not present

## 2020-12-10 DIAGNOSIS — R0602 Shortness of breath: Secondary | ICD-10-CM | POA: Diagnosis not present

## 2020-12-10 DIAGNOSIS — Z87891 Personal history of nicotine dependence: Secondary | ICD-10-CM | POA: Diagnosis not present

## 2020-12-10 DIAGNOSIS — N323 Diverticulum of bladder: Secondary | ICD-10-CM | POA: Diagnosis not present

## 2020-12-10 DIAGNOSIS — I700 Atherosclerosis of aorta: Secondary | ICD-10-CM | POA: Diagnosis not present

## 2020-12-10 DIAGNOSIS — N3000 Acute cystitis without hematuria: Secondary | ICD-10-CM | POA: Insufficient documentation

## 2020-12-10 DIAGNOSIS — K219 Gastro-esophageal reflux disease without esophagitis: Secondary | ICD-10-CM | POA: Diagnosis not present

## 2020-12-10 DIAGNOSIS — Z8507 Personal history of malignant neoplasm of pancreas: Secondary | ICD-10-CM | POA: Insufficient documentation

## 2020-12-10 DIAGNOSIS — M79604 Pain in right leg: Secondary | ICD-10-CM | POA: Insufficient documentation

## 2020-12-10 DIAGNOSIS — K449 Diaphragmatic hernia without obstruction or gangrene: Secondary | ICD-10-CM | POA: Diagnosis not present

## 2020-12-10 DIAGNOSIS — R10813 Right lower quadrant abdominal tenderness: Secondary | ICD-10-CM | POA: Diagnosis not present

## 2020-12-10 DIAGNOSIS — M79605 Pain in left leg: Secondary | ICD-10-CM | POA: Insufficient documentation

## 2020-12-10 DIAGNOSIS — R1031 Right lower quadrant pain: Secondary | ICD-10-CM

## 2020-12-10 DIAGNOSIS — N3289 Other specified disorders of bladder: Secondary | ICD-10-CM | POA: Diagnosis not present

## 2020-12-10 LAB — URINALYSIS, MICROSCOPIC (REFLEX)
RBC / HPF: >50 RBC/hpf (ref 0–5)
WBC, UA: >50 WBC/hpf (ref 0–5)

## 2020-12-10 LAB — URINALYSIS, ROUTINE W REFLEX MICROSCOPIC
Bilirubin Urine: NEGATIVE
Glucose, UA: >=500 mg/dL — AB
Ketones, ur: NEGATIVE mg/dL
Nitrite: NEGATIVE
Protein, ur: 100 mg/dL — AB
Specific Gravity, Urine: 1.025 (ref 1.005–1.030)
pH: 7 (ref 5.0–8.0)

## 2020-12-10 MED ORDER — CEPHALEXIN 500 MG PO CAPS
500.0000 mg | ORAL_CAPSULE | Freq: Two times a day (BID) | ORAL | 0 refills | Status: DC
Start: 2020-12-10 — End: 2021-03-02

## 2020-12-10 NOTE — ED Provider Notes (Signed)
[facility] EMERGENCY DEPARTMENT Provider Note   CSN: [id number] Arrival date & time: 12/10/20  1522     History Chief Complaint  Patient presents with   Groin Pain    Derek [name] is a 75 y.o. male.  He has a history of chronic back pain and was recently on narcotics.  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  Complaining of 3 days of right groin pain severe.  Worse with palpation and ambulation.  Did have a couple of falls recently.  Continues to take his anticoagulation.  Stopped his narcotic pain medication due to the fact that its not helping.  Baseline shortness of breath.  No chest pain or fever.  No numbness or weakness.  The history is provided by the patient.  Groin Pain This is a recurrent problem. The current episode started more than 2 days ago. The problem occurs constantly. The problem has not changed since onset.Associated symptoms include shortness of breath. Pertinent negatives include no chest pain, no abdominal pain and no headaches. The symptoms are aggravated by bending, twisting and walking. Nothing relieves the symptoms. He has tried rest for the symptoms. The treatment provided no relief.      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" no pulmonary md, sees dr [name]   Factor V Leiden mutation ([place])    "never had blood clots"   GERD (gastroesophageal reflux disease)    Hiatal hernia    History of kidney stones    multiple kidney stones-not a problem at present   History of pancreatic cancer    01/ 2017  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, pT1 pNX) no further treatment   History of panic attacks    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   Foreign body of abdominal wall 09/04/2016   Ventral incisional hernia 11/12/2015   Incisional hernia 11/10/2015   Infected pancreatic pseudocyst 05/13/2015   Pleural effusion on left    Lactose intolerance in adult 04/04/2015   Protein-calorie malnutrition, moderate ([place]) 04/04/2015   Pleural effusion, left    Debility    Benign essential HTN    Leukocytosis    Thrombocytosis    Intra-abdominal abscess (H[facility])    Poorly controlled type 2 diabetes mellitus ([place])    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 02/24/2015   OA (osteoarthritis) of knee 06/02/2013   Anal fissure 02/04/2013   Meniere's disease    GERD  (gastroesophageal reflux disease)    Kidney stone    Obstructive sleep apnea    Morbid obesity ([place])    Synovitis of knee 03/01/2012   Chronic cough 11/03/2011   COSTOCHONDRITIS, LEFT 10/08/2009   RIB PAIN, LEFT SIDED 10/08/2009    Past Surgical History:  Procedure Laterality Date   ANAL FISTULECTOMY N/A 04/01/2013   Procedure: EXAM UNDER ANESTHESIA WITH ANAL FISSURectomy, sphincterotomy and internal hemorrhoidectomy;  Surgeon: Harl Bowie, MD;  Location: [facility];  Service: General;  Laterality: N/A;   APPLICATION OF WOUND VAC N/A 05/16/2015   Procedure: APPLICATION OF WOUND VAC;  Surgeon: Armandina Gemma, MD;  Location: [facility];  Service: General;  Laterality: N/A;   APPLICATION OF WOUND VAC N/A 05/20/2015   Procedure: EXHANGE OF ABDOMINAL  WOUND VAC DRESSING;  Surgeon: Armandina Gemma, MD;  Location: [facility];  Service: General;  Laterality: N/A;   COLONOSCOPY     CYSTOSCOPY WITH INSERTION OF UROLIFT N/A 10/12/2016   Procedure: CYSTOSCOPY WITH INSERTION OF UROLIFT;  Surgeon: Franchot Gallo, MD;  Location: [facility];  Service: Urology;  Laterality: N/A;   EUS N/A 12/31/2014   Procedure: UPPER ENDOSCOPIC ULTRASOUND (EUS) LINEAR;  Surgeon: Milus Banister, MD;  [facility];  Service: Endoscopy;  Laterality: N/A;   HARDWARE REMOVAL Left 2013   left ankle   HEMORRHOIDECTOMY WITH HEMORRHOID BANDING     INCISION AND DRAINAGE ABSCESS N/A 05/14/2015   Procedure: DRAINAGE OF INFECTED PANCREATIC PSEUDOCYST;  Surgeon: Armandina Gemma, MD;  [facility];  Service: General;  Laterality: N/A;   INCISION AND DRAINAGE OF WOUND N/A 05/16/2015   Procedure: IRRIGATION AND DEBRIDEMENT WOUND;  Surgeon: Armandina Gemma, MD;  Location: [facility];  Service: General;  Laterality: N/A;   INCISIONAL HERNIA REPAIR N/A 11/12/2015   Procedure: REPAIR INCISIONAL HERNIA WITH MESH;  Surgeon: Armandina Gemma, MD;  Location: [facility];  Service: General;  Laterality: N/A;   INGUINAL HERNIA  REPAIR Right 1974;  [place] N/A 11/12/2015   Procedure: INSERTION OF MESH;  Surgeon: Armandina Gemma, MD;  Location: [facility];  Service: General;  Laterality: N/A;   IR GENERIC HISTORICAL  04/20/2015   IR RADIOLOGIST EVAL & MGMT 04/20/2015 [facility] INTERV RAD   IR IVC FILTER PLMT / S&I Burke Keels GUID/MOD SED  11/25/2020   IR RADIOLOGY PERIPHERAL GUIDED IV START  11/25/2020   IR US GUIDE VASC ACCESS RIGHT  11/25/2020   KNEE ARTHROSCOPY Bilateral right 08-08-2000;  left 11-23-2000   meniscal repair and chondroplasty's   KNEE ARTHROSCOPY  03/01/2012   Procedure: ARTHROSCOPY KNEE;  Surgeon: Gearlean Alf, MD;  Location: [facility];  Service: Orthopedics;  Laterality: Left;  WITH SYNOVECTOMY    LAPAROTOMY N/A  05/14/2015   Procedure: EXPLORATORY LAPAROTOMY;  Surgeon: Armandina Gemma, MD;  Location[facility];  Service: General;  Laterality: N/A;   MASS EXCISION N/A 12/24/2019   Procedure: EXCISION LIP CARCINOMA;  Surgeon: Leta Baptist, MD;  Location: [place];  Service: ENT;  Laterality: N/A;   ORIF LEFT ANKLE FX  08/20/2009   distal tib-fib and malleolus   ROTATOR CUFF REPAIR Bilateral right 1994; left 1993, left 1993   TOTAL KNEE ARTHROPLASTY Right 06/02/2013   Procedure: RIGHT TOTAL KNEE ARTHROPLASTY;  Surgeon: Gearlean Alf, MD;  [facility];  Service: Orthopedics;  Laterality: Right;   TOTAL KNEE ARTHROPLASTY Left 01-31-2008   dr Wynelle Link   TOTAL KNEE REVISION Right 01/23/2018   Procedure: right knee polyethylene revision;  Surgeon: Gaynelle Arabian, MD;  Location[facility];  Service: Orthopedics;  Laterality: Right;  65min   TRANSURETHRAL RESECTION OF PROSTATE N/A 04/21/2019   Procedure: TRANSURETHRAL RESECTION OF THE PROSTATE (TURP);  Surgeon: Franchot Gallo, MD;  Location: [facility];  Service: Urology;  Laterality: N/A;   UMBILICAL HERNIA REPAIR  08-11-1999   dr Ninfa Linden   UPPER GASTROINTESTINAL ENDOSCOPY  sept 2016   WOUND EXPLORATION N/A 09/21/2016   Procedure: WOUND  EXPLORATION AND DEBRIDEMENT ABDOMINAL WALL;  Surgeon: Armandina Gemma, MD;  Location[facility];  Service: General;  Laterality: N/A;       Family History  Problem Relation Age of Onset   Bone cancer Father        Jaw   Factor V Leiden deficiency Daughter    Diabetes Maternal Grandmother    Heart disease Paternal Uncle    Colon cancer Neg Hx    Esophageal cancer Neg Hx    Stomach cancer Neg Hx    Rectal cancer Neg Hx     Social History   Tobacco Use   Smoking status: Former    Packs/day: 4.00    Years: 17.00    Pack years: 68.00    Types: Cigarettes    Quit date: 02/21/1975    Years since quitting: 45.8   Smokeless tobacco: Never  Vaping Use   Vaping Use: Never used  Substance Use Topics   Alcohol use: No    Alcohol/week: 0.0 standard drinks   Drug use: No    Home Medications Prior to Admission medications   Medication Sig Start Date End Date Taking? Authorizing Provider  acetaminophen (TYLENOL) 325 MG tablet Take 2 tablets (650 mg total) by mouth every 6 (six) hours as needed for moderate pain, headache or mild pain. 11/30/20   Cristal Deer, MD  apixaban (ELIQUIS) 5 MG TABS tablet Take 1 tablet by mouth twice a day 09/13/20     apixaban (ELIQUIS) 5 MG TABS tablet Take 1 tablet (5 mg total) by mouth 2 (two) times daily. 11/30/20   Cristal Deer, MD  carvedilol (COREG) 25 MG tablet Take one tablet by mouth once daily in the evening. Patient taking differently: Take 25 mg by mouth at bedtime. 07/01/20     clindamycin (CLEOCIN) 150 MG capsule Take 2 capsules by mouth now, then take 1 capsule 3 times a day until gone 12/09/20     Continuous Blood Gluc Sensor (FREESTYLE LIBRE 2 SENSOR) MISC Use as directed 12/07/20     furosemide (LASIX) 20 MG tablet Take 20 mg by mouth daily as needed for edema.    [provider]  glucagon 1 MG injection Inject 1 mg into the vein once as needed for low blood sugar ((give  in addition to dextrose)). 11/30/20 11/25/21  Cristal Deer,  MD  Homeopathic Products ([place]) FOAM Apply 1 application topically daily as needed (for knee cramps).    [provider]  HYDROmorphone (DILAUDID) 8 MG tablet Take 1 tablet by mouth 7 times a day as needed 11/11/20     hydrOXYzine (VISTARIL) 25 MG capsule Take 1 capsule by mouth every 8 hours as needed for stress 08/24/20     insulin aspart (NOVOLOG) 100 UNIT/ML injection USE 100U/DAY SUBCUTANEOUS VIA PUMP 30 DAY(S) Patient taking differently: Inject 5-30 Units into the skin See admin instructions. Inject 5-30 units into the skin three times a day before meals, PER SLIDING SCALE 12/23/19 12/22/20  Jacelyn Pi, MD  insulin aspart (NOVOLOG) 100 UNIT/ML injection Inject 10 - 20 units under the skin 3 times daily before meals 12/07/20     insulin degludec (TRESIBA FLEXTOUCH) 100 UNIT/ML FlexTouch Pen Inject 14 units Subcutaneous Once a day 30 days Patient taking differently: Inject 14 Units into the skin daily before breakfast. 07/22/20     Insulin Syringe-Needle U-100 31G X 5/16" 1 ML MISC USE AS DIRECTED TO INJECT 01/19/20 01/18/21  Jacelyn Pi, MD  lansoprazole (PREVACID) 30 MG capsule Take one capsule by mouth once daily. Patient taking differently: Take 30 mg by mouth at bedtime. 07/01/20     lidocaine (LIDODERM) 5 % Place 1 patch onto the skin daily. Remove & Discard patch within 12 hours or as directed by MD 12/01/20   Cristal Deer, MD  losartan (COZAAR) 50 MG tablet Take one tablet by mouth every evening. Patient taking differently: Take 50 mg by mouth at bedtime. 07/01/20     magnesium oxide (MAG-OX) 400 MG tablet Take 800 mg by mouth at bedtime. Triple complex    [provider]  megestrol (MEGACE) 40 MG tablet Take one tablet by mouth twice daily. Patient taking differently: Take 40 mg by mouth at bedtime. 07/01/20     polyethylene glycol powder (MIRALAX) 17 GM/SCOOP powder Mix 1 scoop (17g) in 8 ounces of water or juice once daily Patient not taking: No sig  reported 08/02/20     pravastatin (PRAVACHOL) 20 MG tablet Take one tablet by mouth twice weekly. Patient taking differently: Take 20 mg by mouth 2 (two) times a week. 07/01/20     pregabalin (LYRICA) 25 MG capsule Take 1 capsule (25 mg total) by mouth at bedtime. 11/30/20   Cristal Deer, MD  QUEtiapine (SEROQUEL) 25 MG tablet Take 1 tablet (25 mg total) by mouth at bedtime. 11/30/20   Cristal Deer, MD  senna (SENOKOT) 8.6 MG TABS tablet Take 1 tablet by mouth in the morning and at bedtime.    [provider]  triamcinolone cream (KENALOG) 0.1 % Apply externally two times a day for 30 days. 10/07/20       Allergies    Duloxetine, Doxycycline, Morphine and related, Oxycontin [oxycodone hcl], Quinine, Sertraline, Voltaren [diclofenac sodium], Diclofenac, Doxycycline hyclate, Duloxetine hcl, Gabapentin, Hydrocodone, Oxybutynin, Sertraline hcl, and Tape  Review of Systems   Review of Systems  Constitutional:  Negative for fever.  HENT:  Negative for sore throat.   Eyes:  Negative for visual disturbance.  Respiratory:  Positive for shortness of breath.   Cardiovascular:  Negative for chest pain.  Gastrointestinal:  Negative for abdominal pain, nausea and vomiting.  Genitourinary:  Negative for dysuria and testicular pain.  Musculoskeletal:  Positive for back pain.  Skin:  Negative for rash.  Neurological:  Negative for headaches.  Physical Exam Updated Vital Signs BP (!) 157/85 (BP Location: Left Arm)   Pulse 78   Temp 98.2 F (36.8 C) (Oral)   Resp 20   Ht 5\' 11"  (1.803 m)   Wt 114.3 kg   SpO2 100%   BMI 35.15 kg/m   Physical Exam Vitals and nursing note reviewed.  Constitutional:      Appearance: Normal appearance. He is well-developed.  HENT:     Head: Normocephalic and atraumatic.  Eyes:     Conjunctiva/sclera: Conjunctivae normal.  Cardiovascular:     Rate and Rhythm: Normal rate and regular rhythm.     Heart sounds: No murmur heard. Pulmonary:      Effort: Pulmonary effort is normal. No respiratory distress.     Breath sounds: Normal breath sounds.  Abdominal:     Palpations: Abdomen is soft.     Tenderness: There is no abdominal tenderness. There is no guarding or rebound.     Comments: Tender in his right inguinal area.  No masses appreciated.  No testicular pain or swelling.  Musculoskeletal:        General: No deformity or signs of injury. Normal range of motion.     Cervical back: Neck supple.  Skin:    General: Skin is warm and dry.  Neurological:     General: No focal deficit present.     Mental Status: He is alert.     Sensory: No sensory deficit.     Motor: No weakness.    ED Results / Procedures / Treatments   Labs (all labs ordered are listed, but only abnormal results are displayed) Labs Reviewed  URINALYSIS, ROUTINE W REFLEX MICROSCOPIC - Abnormal; Notable for the following components:      Result Value   Color, Urine AMBER (*)    APPearance CLOUDY (*)    Glucose, UA >=500 (*)    Hgb urine dipstick LARGE (*)    Protein, ur 100 (*)    Leukocytes,Ua SMALL (*)    All other components within normal limits  URINALYSIS, MICROSCOPIC (REFLEX) - Abnormal; Notable for the following components:   Bacteria, UA MANY (*)    All other components within normal limits  URINE CULTURE    EKG None  Radiology US Venous Img Lower Right (DVT Study)  Result Date: 12/10/2020 CLINICAL DATA:  Right lower extremity pain and edema for the past 4 days. History of DVT. Evaluate for acute or chronic DVT. EXAM: RIGHT LOWER EXTREMITY VENOUS DOPPLER ULTRASOUND TECHNIQUE: Gray-scale sonography with graded compression, as well as color Doppler and duplex ultrasound were performed to evaluate the lower extremity deep venous systems from the level of the common femoral vein and including the common femoral, femoral, profunda femoral, popliteal and calf veins including the posterior tibial, peroneal and gastrocnemius veins when visible. The  superficial great saphenous vein was also interrogated. Spectral Doppler was utilized to evaluate flow at rest and with distal augmentation maneuvers in the common femoral, femoral and popliteal veins. COMPARISON:  Right lower extremity venous Doppler ultrasound-11/07/2020 (positive for nonocclusive DVT involving the right common femoral vein as well as superficial thrombophlebitis involving the greater saphenous vein) FINDINGS: Contralateral Common Femoral Vein: Respiratory phasicity is normal and symmetric with the symptomatic side. No evidence of thrombus. Normal compressibility. Common Femoral Vein: No evidence of acute or chronic thrombus. Normal compressibility, respiratory phasicity and response to augmentation. Saphenofemoral Junction: No evidence of thrombus. Normal compressibility and flow on color Doppler imaging. Profunda Femoral Vein: No evidence of  thrombus. Normal compressibility and flow on color Doppler imaging. Femoral Vein: No evidence of thrombus. Normal compressibility, respiratory phasicity and response to augmentation. Popliteal Vein: No evidence of thrombus. Normal compressibility, respiratory phasicity and response to augmentation. Calf Veins: No evidence of thrombus. Normal compressibility and flow on color Doppler imaging. Superficial Great Saphenous Vein: No evidence of acute or chronic thrombus. Normal compressibility. Venous Reflux:  None. Other Findings:  None. IMPRESSION: No evidence of acute or chronic DVT within the right lower extremity with special attention paid to the right common femoral and greater saphenous veins. Electronically Signed   By: Sandi Mariscal M.D.   On: 12/10/2020 16:35   CT Renal Stone Study  Result Date: 12/10/2020 CLINICAL DATA:  Flank pain, kidney stone suspected rt groin pain. Pt c/o right groin pain x 3 days-hx of same with recent "blood clot"/admn 9/28-slow gait-NAD EXAM: CT ABDOMEN AND PELVIS WITHOUT CONTRAST TECHNIQUE: Multidetector CT imaging of the  abdomen and pelvis was performed following the standard protocol without IV contrast. COMPARISON:  CT abdomen pelvis 11/18/2020 FINDINGS: Lower chest: Partially visualized at least moderate volume hiatal hernia. Hepatobiliary: No focal liver abnormality. No gallstones, gallbladder wall thickening, or pericholecystic fluid. No biliary dilatation. Pancreas: Surgical changes related to distal pancreatectomy. No focal lesion. Normal pancreatic contour. No surrounding inflammatory changes. No main pancreatic ductal dilatation. Spleen: Normal in size without focal abnormality. Status post splenectomy. Adrenals/Urinary Tract: No adrenal nodule bilaterally. No nephrolithiasis and no hydronephrosis. No definite contour-deforming renal mass. No ureterolithiasis or hydroureter. Circumferential irregular urinary bladder wall thickening. Associated mild pericecal fat stranding. Associated right posterior urinary diverticula. Stomach/Bowel: Stomach is within normal limits. No evidence of bowel wall thickening or dilatation. Appendix appears normal. Vascular/Lymphatic: Inferior vena cava filter noted with tip approximately 3 cm inferior to the renal tributaries. The tip is noted alongside the left inferior vena cava wall just inferior to a slight right curve of the inferior vena cava. No abdominal aorta or iliac aneurysm. Moderate atherosclerotic plaque of the aorta and its branches. No abdominal, pelvic, or inguinal lymphadenopathy. Reproductive: Prostate is unremarkable. Other: No intraperitoneal free fluid. No intraperitoneal free gas. No organized fluid collection. Musculoskeletal: No abdominal wall hernia or abnormality. No suspicious lytic or blastic osseous lesions. No acute displaced fracture. Multilevel degenerative changes of the spine. IMPRESSION: 1. Circumferential irregular urinary bladder wall thickening. Urinary bladder diverticula noted. Correlate with urinalysis for infection. Differential diagnosis includes  malignancy. 2. Interval placement of an inferior vena cava filter with tip approximately 3 cm inferior to the renal tributaries. The tip is noted alongside the left inferior vena cava wall just inferior to a slight right curve of the inferior vena cava. 3. Partially visualized at least moderate volume hiatal hernia. 4. Aortic Atherosclerosis (ICD10-I70.0). Electronically Signed   By: Iven Finn M.D.   On: 12/10/2020 17:34   DG Hip Unilat With Pelvis 2-3 Views Right  Result Date: 12/10/2020 CLINICAL DATA:  Right groin pain EXAM: DG HIP (WITH OR WITHOUT PELVIS) 2-3V RIGHT COMPARISON:  Ultrasound 12/10/2020 FINDINGS: SI joints are patent. Pubic symphysis and rami appear intact. Surgical clips or sutures over the low pelvis. No fracture or malalignment. Mild degenerative changes of the right hip IMPRESSION: Mild degenerative changes.  No acute osseous abnormality Electronically Signed   By: Donavan Foil M.D.   On: 12/10/2020 16:44    Procedures Procedures   Medications Ordered in ED Medications - No data to display  ED Course  I have reviewed the triage vital signs and  the nursing notes.  Pertinent labs & imaging results that were available during my care of the patient were reviewed by me and considered in my medical decision making (see chart for details).  Clinical Course as of 12/11/20 1040  Fri Dec 10, 2020  1711 Patient does admit to 2 falls recently.  Denies hitting his head and has no headache. [MB]  1751 Reviewed results with patient.  He is agreeable to treatment with antibiotics for possible UTI.  Recommended close follow-up with his PCP.  Return instructions discussed [MB]    Clinical Course User Index [MB] Hayden Rasmussen, MD   MDM Rules/Calculators/A&P                          This patient complains of right groin pain; this involves an extensive number of treatment Options and is a complaint that carries with it a high risk of complications and Morbidity. The  differential includes musculoskeletal pain, hernia, kidney stone, testicular pain, UTI, hip fracture  I ordered, reviewed and interpreted labs, which included urinalysis with possible signs of infection greater than 50 reds greater than 50 whites many bacteria I ordered imaging studies which included right hip x-ray, duplex right lower extremity, CT renal and I independently    visualized and interpreted imaging which showed no hip fracture, no DVT, CT with bladder thickening concerning for UTI. Additional history obtained from patient's wife Previous records obtained and reviewed in epic was recently seen for DVT and placed on anticoagulation  After the interventions stated above, I reevaluated the patient and found patient symptoms to be stable, hemodynamically stable.  Reviewed results of work-up with him.  He is comfortable plan for treatment with antibiotics.  Recommended close follow-up with his treating providers.  Return instructions discussed   Final Clinical Impression(s) / ED Diagnoses Final diagnoses:  Right groin pain  Acute cystitis without hematuria    Rx / DC Orders ED Discharge Orders          Ordered    cephALEXin (KEFLEX) 500 MG capsule  2 times daily        12/10/20 1755             Hayden Rasmussen, MD 12/11/20 1045

## 2020-12-10 NOTE — Discharge Instructions (Addendum)
You were seen in the emergency department for evaluation of right-sided groin pain.  You had a urinalysis that showed possible signs of infection.  You had x-rays of your right hip along with an ultrasound of your right leg and a CAT scan of your abdomen and pelvis.  We are prescribing some antibiotics.  Please follow-up with your primary care doctor.  Return to the emergency department if any worsening or concerning symptoms.

## 2020-12-10 NOTE — ED Triage Notes (Addendum)
Pt c/o right groin pain x 3 days-hx of same with recent "blood clot"/admn 9/28-slow gait-NAD

## 2020-12-12 LAB — URINE CULTURE: Culture: NO GROWTH

## 2020-12-13 DIAGNOSIS — K219 Gastro-esophageal reflux disease without esophagitis: Secondary | ICD-10-CM | POA: Diagnosis not present

## 2020-12-13 DIAGNOSIS — E119 Type 2 diabetes mellitus without complications: Secondary | ICD-10-CM | POA: Diagnosis not present

## 2020-12-13 DIAGNOSIS — E7800 Pure hypercholesterolemia, unspecified: Secondary | ICD-10-CM | POA: Diagnosis not present

## 2020-12-13 DIAGNOSIS — F339 Major depressive disorder, recurrent, unspecified: Secondary | ICD-10-CM | POA: Diagnosis not present

## 2020-12-13 DIAGNOSIS — E1169 Type 2 diabetes mellitus with other specified complication: Secondary | ICD-10-CM | POA: Diagnosis not present

## 2020-12-13 DIAGNOSIS — I10 Essential (primary) hypertension: Secondary | ICD-10-CM | POA: Diagnosis not present

## 2020-12-13 DIAGNOSIS — M1990 Unspecified osteoarthritis, unspecified site: Secondary | ICD-10-CM | POA: Diagnosis not present

## 2020-12-13 DIAGNOSIS — E1165 Type 2 diabetes mellitus with hyperglycemia: Secondary | ICD-10-CM | POA: Diagnosis not present

## 2020-12-13 DIAGNOSIS — G8929 Other chronic pain: Secondary | ICD-10-CM | POA: Diagnosis not present

## 2020-12-14 MED FILL — Insulin Syringe/Needle U-100 1 ML 31 x 5/16": 45 days supply | Qty: 100 | Fill #2 | Status: AC

## 2020-12-15 ENCOUNTER — Other Ambulatory Visit (HOSPITAL_BASED_OUTPATIENT_CLINIC_OR_DEPARTMENT_OTHER): Payer: Self-pay

## 2020-12-15 DIAGNOSIS — E1065 Type 1 diabetes mellitus with hyperglycemia: Secondary | ICD-10-CM | POA: Diagnosis not present

## 2020-12-16 ENCOUNTER — Other Ambulatory Visit (HOSPITAL_BASED_OUTPATIENT_CLINIC_OR_DEPARTMENT_OTHER): Payer: Self-pay

## 2020-12-20 ENCOUNTER — Other Ambulatory Visit (HOSPITAL_BASED_OUTPATIENT_CLINIC_OR_DEPARTMENT_OTHER): Payer: Self-pay

## 2020-12-21 ENCOUNTER — Other Ambulatory Visit (HOSPITAL_BASED_OUTPATIENT_CLINIC_OR_DEPARTMENT_OTHER): Payer: Self-pay

## 2020-12-21 MED ORDER — ELIQUIS 5 MG PO TABS
5.0000 mg | ORAL_TABLET | Freq: Two times a day (BID) | ORAL | 0 refills | Status: DC
  Filled 2020-12-21: qty 180, 90d supply, fill #0

## 2020-12-30 ENCOUNTER — Other Ambulatory Visit (HOSPITAL_BASED_OUTPATIENT_CLINIC_OR_DEPARTMENT_OTHER): Payer: Self-pay

## 2020-12-30 MED ORDER — MEGESTROL ACETATE 40 MG PO TABS
40.0000 mg | ORAL_TABLET | Freq: Two times a day (BID) | ORAL | 1 refills | Status: DC
  Filled 2020-12-30: qty 60, 30d supply, fill #0
  Filled 2021-02-22: qty 60, 30d supply, fill #1

## 2020-12-30 MED ORDER — AMOXICILLIN 500 MG PO CAPS
ORAL_CAPSULE | ORAL | 0 refills | Status: DC
  Filled 2020-12-30: qty 23, 7d supply, fill #0

## 2020-12-30 MED ORDER — IBUPROFEN 800 MG PO TABS
ORAL_TABLET | ORAL | 0 refills | Status: DC
  Filled 2020-12-30: qty 20, 4d supply, fill #0

## 2021-01-08 DIAGNOSIS — E119 Type 2 diabetes mellitus without complications: Secondary | ICD-10-CM | POA: Diagnosis not present

## 2021-01-08 DIAGNOSIS — E1165 Type 2 diabetes mellitus with hyperglycemia: Secondary | ICD-10-CM | POA: Diagnosis not present

## 2021-01-08 DIAGNOSIS — G8929 Other chronic pain: Secondary | ICD-10-CM | POA: Diagnosis not present

## 2021-01-08 DIAGNOSIS — M189 Osteoarthritis of first carpometacarpal joint, unspecified: Secondary | ICD-10-CM | POA: Diagnosis not present

## 2021-01-08 DIAGNOSIS — K219 Gastro-esophageal reflux disease without esophagitis: Secondary | ICD-10-CM | POA: Diagnosis not present

## 2021-01-08 DIAGNOSIS — M1990 Unspecified osteoarthritis, unspecified site: Secondary | ICD-10-CM | POA: Diagnosis not present

## 2021-01-08 DIAGNOSIS — I10 Essential (primary) hypertension: Secondary | ICD-10-CM | POA: Diagnosis not present

## 2021-01-08 DIAGNOSIS — E1169 Type 2 diabetes mellitus with other specified complication: Secondary | ICD-10-CM | POA: Diagnosis not present

## 2021-01-08 DIAGNOSIS — E7800 Pure hypercholesterolemia, unspecified: Secondary | ICD-10-CM | POA: Diagnosis not present

## 2021-01-08 DIAGNOSIS — F339 Major depressive disorder, recurrent, unspecified: Secondary | ICD-10-CM | POA: Diagnosis not present

## 2021-01-11 ENCOUNTER — Other Ambulatory Visit (HOSPITAL_BASED_OUTPATIENT_CLINIC_OR_DEPARTMENT_OTHER): Payer: Self-pay

## 2021-01-18 ENCOUNTER — Other Ambulatory Visit (HOSPITAL_BASED_OUTPATIENT_CLINIC_OR_DEPARTMENT_OTHER): Payer: Self-pay

## 2021-01-18 DIAGNOSIS — G8929 Other chronic pain: Secondary | ICD-10-CM | POA: Diagnosis not present

## 2021-01-18 DIAGNOSIS — M545 Low back pain, unspecified: Secondary | ICD-10-CM | POA: Diagnosis not present

## 2021-01-18 DIAGNOSIS — Z5181 Encounter for therapeutic drug level monitoring: Secondary | ICD-10-CM | POA: Diagnosis not present

## 2021-01-18 DIAGNOSIS — G894 Chronic pain syndrome: Secondary | ICD-10-CM | POA: Diagnosis not present

## 2021-01-18 DIAGNOSIS — Z79899 Other long term (current) drug therapy: Secondary | ICD-10-CM | POA: Diagnosis not present

## 2021-01-20 ENCOUNTER — Other Ambulatory Visit (HOSPITAL_BASED_OUTPATIENT_CLINIC_OR_DEPARTMENT_OTHER): Payer: Self-pay

## 2021-01-21 ENCOUNTER — Other Ambulatory Visit (HOSPITAL_BASED_OUTPATIENT_CLINIC_OR_DEPARTMENT_OTHER): Payer: Self-pay

## 2021-01-24 ENCOUNTER — Other Ambulatory Visit (HOSPITAL_BASED_OUTPATIENT_CLINIC_OR_DEPARTMENT_OTHER): Payer: Self-pay

## 2021-01-25 ENCOUNTER — Other Ambulatory Visit (HOSPITAL_BASED_OUTPATIENT_CLINIC_OR_DEPARTMENT_OTHER): Payer: Self-pay

## 2021-01-25 DIAGNOSIS — R3914 Feeling of incomplete bladder emptying: Secondary | ICD-10-CM | POA: Diagnosis not present

## 2021-01-25 DIAGNOSIS — N401 Enlarged prostate with lower urinary tract symptoms: Secondary | ICD-10-CM | POA: Diagnosis not present

## 2021-01-25 DIAGNOSIS — N481 Balanitis: Secondary | ICD-10-CM | POA: Diagnosis not present

## 2021-01-25 DIAGNOSIS — N3000 Acute cystitis without hematuria: Secondary | ICD-10-CM | POA: Diagnosis not present

## 2021-01-25 DIAGNOSIS — R8279 Other abnormal findings on microbiological examination of urine: Secondary | ICD-10-CM | POA: Diagnosis not present

## 2021-01-25 MED ORDER — TAMSULOSIN HCL 0.4 MG PO CAPS
ORAL_CAPSULE | ORAL | 3 refills | Status: DC
  Filled 2021-01-25: qty 30, 30d supply, fill #0
  Filled 2021-02-22: qty 30, 30d supply, fill #1
  Filled 2021-03-21: qty 30, 30d supply, fill #2
  Filled 2021-04-19: qty 30, 30d supply, fill #3

## 2021-01-25 MED ORDER — AMOXICILLIN-POT CLAVULANATE 500-125 MG PO TABS
ORAL_TABLET | ORAL | 0 refills | Status: DC
  Filled 2021-01-25: qty 14, 7d supply, fill #0

## 2021-01-25 MED ORDER — HYDROCORTISONE 1 % EX OINT
TOPICAL_OINTMENT | CUTANEOUS | 0 refills | Status: DC
  Filled 2021-01-25: qty 56.8, 14d supply, fill #0

## 2021-01-26 ENCOUNTER — Other Ambulatory Visit (HOSPITAL_BASED_OUTPATIENT_CLINIC_OR_DEPARTMENT_OTHER): Payer: Self-pay

## 2021-01-26 MED ORDER — QUETIAPINE FUMARATE 25 MG PO TABS
ORAL_TABLET | ORAL | 1 refills | Status: DC
  Filled 2021-01-26: qty 30, 30d supply, fill #0

## 2021-01-27 ENCOUNTER — Other Ambulatory Visit (HOSPITAL_BASED_OUTPATIENT_CLINIC_OR_DEPARTMENT_OTHER): Payer: Self-pay

## 2021-01-28 ENCOUNTER — Other Ambulatory Visit (HOSPITAL_BASED_OUTPATIENT_CLINIC_OR_DEPARTMENT_OTHER): Payer: Self-pay

## 2021-01-28 MED ORDER — CEPHALEXIN 500 MG PO CAPS
500.0000 mg | ORAL_CAPSULE | Freq: Two times a day (BID) | ORAL | 0 refills | Status: DC
  Filled 2021-01-28: qty 14, 7d supply, fill #0

## 2021-01-30 ENCOUNTER — Other Ambulatory Visit (HOSPITAL_BASED_OUTPATIENT_CLINIC_OR_DEPARTMENT_OTHER): Payer: Self-pay

## 2021-01-31 ENCOUNTER — Other Ambulatory Visit (HOSPITAL_BASED_OUTPATIENT_CLINIC_OR_DEPARTMENT_OTHER): Payer: Self-pay

## 2021-01-31 MED ORDER — "INSULIN SYRINGE-NEEDLE U-100 31G X 5/16"" 1 ML MISC"
13 refills | Status: DC
  Filled 2021-01-31: qty 200, 90d supply, fill #0
  Filled 2021-01-31: qty 200, 30d supply, fill #0
  Filled 2021-03-21 – 2021-04-21 (×2): qty 200, 90d supply, fill #1

## 2021-02-01 ENCOUNTER — Other Ambulatory Visit (HOSPITAL_BASED_OUTPATIENT_CLINIC_OR_DEPARTMENT_OTHER): Payer: Self-pay

## 2021-02-01 MED ORDER — FREESTYLE LIBRE 2 READER DEVI
0 refills | Status: DC
  Filled 2021-02-01: qty 1, 30d supply, fill #0

## 2021-02-03 ENCOUNTER — Other Ambulatory Visit (HOSPITAL_BASED_OUTPATIENT_CLINIC_OR_DEPARTMENT_OTHER): Payer: Self-pay

## 2021-02-15 DIAGNOSIS — I10 Essential (primary) hypertension: Secondary | ICD-10-CM | POA: Diagnosis not present

## 2021-02-15 DIAGNOSIS — E7800 Pure hypercholesterolemia, unspecified: Secondary | ICD-10-CM | POA: Diagnosis not present

## 2021-02-15 DIAGNOSIS — G8929 Other chronic pain: Secondary | ICD-10-CM | POA: Diagnosis not present

## 2021-02-15 DIAGNOSIS — E1169 Type 2 diabetes mellitus with other specified complication: Secondary | ICD-10-CM | POA: Diagnosis not present

## 2021-02-15 DIAGNOSIS — E1165 Type 2 diabetes mellitus with hyperglycemia: Secondary | ICD-10-CM | POA: Diagnosis not present

## 2021-02-15 DIAGNOSIS — E119 Type 2 diabetes mellitus without complications: Secondary | ICD-10-CM | POA: Diagnosis not present

## 2021-02-23 ENCOUNTER — Other Ambulatory Visit (HOSPITAL_BASED_OUTPATIENT_CLINIC_OR_DEPARTMENT_OTHER): Payer: Self-pay

## 2021-02-24 ENCOUNTER — Other Ambulatory Visit (HOSPITAL_BASED_OUTPATIENT_CLINIC_OR_DEPARTMENT_OTHER): Payer: Self-pay

## 2021-02-24 DIAGNOSIS — G8929 Other chronic pain: Secondary | ICD-10-CM | POA: Diagnosis not present

## 2021-02-24 DIAGNOSIS — R319 Hematuria, unspecified: Secondary | ICD-10-CM | POA: Diagnosis not present

## 2021-02-24 DIAGNOSIS — E1165 Type 2 diabetes mellitus with hyperglycemia: Secondary | ICD-10-CM | POA: Diagnosis not present

## 2021-02-24 DIAGNOSIS — Z6835 Body mass index (BMI) 35.0-35.9, adult: Secondary | ICD-10-CM | POA: Diagnosis not present

## 2021-02-24 DIAGNOSIS — E119 Type 2 diabetes mellitus without complications: Secondary | ICD-10-CM | POA: Diagnosis not present

## 2021-02-24 MED ORDER — NYSTATIN 100000 UNIT/ML MT SUSP
OROMUCOSAL | 0 refills | Status: DC
  Filled 2021-02-24: qty 470, 47d supply, fill #0

## 2021-02-25 ENCOUNTER — Other Ambulatory Visit (HOSPITAL_BASED_OUTPATIENT_CLINIC_OR_DEPARTMENT_OTHER): Payer: Self-pay

## 2021-03-02 ENCOUNTER — Other Ambulatory Visit (HOSPITAL_BASED_OUTPATIENT_CLINIC_OR_DEPARTMENT_OTHER): Payer: Self-pay

## 2021-03-02 ENCOUNTER — Inpatient Hospital Stay: Payer: HMO | Attending: Hematology & Oncology

## 2021-03-02 ENCOUNTER — Inpatient Hospital Stay: Payer: HMO | Admitting: Family

## 2021-03-02 ENCOUNTER — Encounter: Payer: Self-pay | Admitting: Family

## 2021-03-02 ENCOUNTER — Other Ambulatory Visit: Payer: Self-pay | Admitting: Family

## 2021-03-02 ENCOUNTER — Other Ambulatory Visit: Payer: Self-pay

## 2021-03-02 VITALS — BP 126/63 | HR 75 | Temp 98.3°F | Resp 20 | Ht 72.0 in | Wt 262.1 lb

## 2021-03-02 DIAGNOSIS — B351 Tinea unguium: Secondary | ICD-10-CM | POA: Diagnosis not present

## 2021-03-02 DIAGNOSIS — I82401 Acute embolism and thrombosis of unspecified deep veins of right lower extremity: Secondary | ICD-10-CM

## 2021-03-02 DIAGNOSIS — D6851 Activated protein C resistance: Secondary | ICD-10-CM

## 2021-03-02 DIAGNOSIS — R3915 Urgency of urination: Secondary | ICD-10-CM | POA: Insufficient documentation

## 2021-03-02 DIAGNOSIS — M79672 Pain in left foot: Secondary | ICD-10-CM | POA: Diagnosis not present

## 2021-03-02 DIAGNOSIS — E119 Type 2 diabetes mellitus without complications: Secondary | ICD-10-CM | POA: Diagnosis not present

## 2021-03-02 DIAGNOSIS — M79671 Pain in right foot: Secondary | ICD-10-CM | POA: Diagnosis not present

## 2021-03-02 DIAGNOSIS — Q6689 Other  specified congenital deformities of feet: Secondary | ICD-10-CM | POA: Diagnosis not present

## 2021-03-02 DIAGNOSIS — Z86718 Personal history of other venous thrombosis and embolism: Secondary | ICD-10-CM | POA: Diagnosis not present

## 2021-03-02 DIAGNOSIS — D649 Anemia, unspecified: Secondary | ICD-10-CM

## 2021-03-02 DIAGNOSIS — Z7901 Long term (current) use of anticoagulants: Secondary | ICD-10-CM

## 2021-03-02 DIAGNOSIS — R319 Hematuria, unspecified: Secondary | ICD-10-CM | POA: Diagnosis not present

## 2021-03-02 LAB — CBC WITH DIFFERENTIAL (CANCER CENTER ONLY)
Abs Immature Granulocytes: 0.03 10*3/uL (ref 0.00–0.07)
Basophils Absolute: 0.1 10*3/uL (ref 0.0–0.1)
Basophils Relative: 2 %
Eosinophils Absolute: 0.4 10*3/uL (ref 0.0–0.5)
Eosinophils Relative: 5 %
HCT: 36.2 % — ABNORMAL LOW (ref 39.0–52.0)
Hemoglobin: 11.9 g/dL — ABNORMAL LOW (ref 13.0–17.0)
Immature Granulocytes: 0 %
Lymphocytes Relative: 43 %
Lymphs Abs: 3.4 10*3/uL (ref 0.7–4.0)
MCH: 30.1 pg (ref 26.0–34.0)
MCHC: 32.9 g/dL (ref 30.0–36.0)
MCV: 91.6 fL (ref 80.0–100.0)
Monocytes Absolute: 1.1 10*3/uL — ABNORMAL HIGH (ref 0.1–1.0)
Monocytes Relative: 13 %
Neutro Abs: 3 10*3/uL (ref 1.7–7.7)
Neutrophils Relative %: 37 %
Platelet Count: 416 10*3/uL — ABNORMAL HIGH (ref 150–400)
RBC: 3.95 MIL/uL — ABNORMAL LOW (ref 4.22–5.81)
RDW: 13.7 % (ref 11.5–15.5)
WBC Count: 8.1 10*3/uL (ref 4.0–10.5)
nRBC: 0 % (ref 0.0–0.2)

## 2021-03-02 LAB — CMP (CANCER CENTER ONLY)
ALT: 11 U/L (ref 0–44)
AST: 11 U/L — ABNORMAL LOW (ref 15–41)
Albumin: 3.9 g/dL (ref 3.5–5.0)
Alkaline Phosphatase: 89 U/L (ref 38–126)
Anion gap: 8 (ref 5–15)
BUN: 24 mg/dL — ABNORMAL HIGH (ref 8–23)
CO2: 23 mmol/L (ref 22–32)
Calcium: 11.1 mg/dL — ABNORMAL HIGH (ref 8.9–10.3)
Chloride: 105 mmol/L (ref 98–111)
Creatinine: 1.12 mg/dL (ref 0.61–1.24)
GFR, Estimated: >60 mL/min (ref >60)
Glucose, Bld: 286 mg/dL — ABNORMAL HIGH (ref 70–99)
Potassium: 4.6 mmol/L (ref 3.5–5.1)
Sodium: 136 mmol/L (ref 135–145)
Total Bilirubin: 0.5 mg/dL (ref 0.3–1.2)
Total Protein: 6.4 g/dL — ABNORMAL LOW (ref 6.5–8.1)

## 2021-03-02 MED ORDER — FUSION PLUS PO CAPS
1.0000 | ORAL_CAPSULE | Freq: Every day | ORAL | 11 refills | Status: DC
  Filled 2021-03-02: qty 30, 30d supply, fill #0
  Filled 2021-03-28: qty 30, 30d supply, fill #1
  Filled 2021-04-30: qty 30, 30d supply, fill #2

## 2021-03-02 NOTE — Progress Notes (Signed)
Hematology/Oncology Consultation   Name: Derek [name]      MRN: [id number]    Location: Room/bed info not found  Date: 03/02/2021 Time:11:05 AM   REFERRING PHYSICIAN:  Charles A. 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 He has tried several and is currently on Eliquis full dose. He states that when he urinates it looks like it is "all blood". He states that he has urgency and frequency of urination with occasional incontinence.  No other blood loss noted. No abnormal bruising, no petechiae.  He is followed closely by urologist Dr. Diona Fanti for BPH and has an appointment scheduled for next week for further evaluation for cause of symptoms.  He has had a cystoscopy and transurethral resection of the prostate in the past. He states that his PSA has remained within normal limits.  His daughter developed DVT's while on birth control and was diagnosed with Factor V Leiden. He was tested and was also positive for Factor V Leiden.  He has severe chronic back pain and had started Ketamine infusions with Dr. Cleon Gustin. After the second infusion he developed bilateral lower extremity DVTs. No prior history of thrombotic event at that time. 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  He had an IVC filter placed in October and prefers to keep this in place long term.  He states that he has had multiple surgeries in the past without any complications.  He denies injury, illness or travel at or around time of thrombotic event.  He quit smoking in 1977. No ETOH or recreational drug use.  He is symptomatic with fatigue,  weakness, chills and SOB with exertion.  He has occasional episodes of dizziness secondary to meniere's disease.  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 Harrington Challenger.  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 Benjamine Mola. He states that he still follows up with their office.   His father also had history of skin cancer.  No history of thyroid disease.  He is diabetic and states that he is on insulin and Trulicity.  He has maintained a good appetite and is staying well hydrated. His weight is stable at 262 lbs.  No fever, n/v, cough, chest pain, palpitations, abdominal pain/bloating or changes in bowel or bladder habits.  He has contact dermatitis that effects his hands and legs. This waxes and wanes. He uses hydrocortisone cream as needed.  No swelling or tenderness in his extremities.  He has tingling in the left great toe. He states that he has had this for many years and it is unchanged from baseline.  He ambulates with a cane for added support.  No falls or syncope to report. 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   ROS: All other 10 point review of systems is negative.   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" no pulmonary md, sees dr [name]   Factor V Leiden mutation ([place])    "never had blood clots"   GERD (gastroesophageal reflux disease)    Hiatal hernia    History of kidney stones    multiple kidney stones-not a problem at present   History of pancreatic  cancer    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, pT1 pNX) no further treatment   History of panic attacks    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    ALLERGIES: Allergies  Allergen Reactions   Duloxetine Other (See Comments)    Night sweats   Doxycycline Other (See Comments)    Severe nose bleeds   Morphine And Related Nausea And Vomiting, Swelling and Other (See Comments)    Facial swelling and lips swell, but tolerates immediate-release oxycodone as well as hydrocodone    Oxycontin [Oxycodone Hcl] Swelling and Other (See Comments)    Lips swell, but tolerates immediate-release oxycodone as well as hydrocodone   Quinine Other (See Comments)    Platelets dropped- consumptive coagulopathy   Sertraline Other (See Comments)    Can't sleep or urinate   Voltaren [Diclofenac Sodium] Other (See Comments)    Elevated liver enzymes   Diclofenac Swelling and Other (See Comments)    Elevated liver enzymes and lips swell   Doxycycline Hyclate Other (See Comments)    Epistaxis   Duloxetine Hcl Other (See Comments)    Night sweats   Gabapentin Other (See Comments)    Reaction??   Hydrocodone Swelling and Other (See Comments)     Facial swelling- can tolerate "immediate-release" Hydrocodone, though   Oxybutynin Other (See Comments)    Swelling    Sertraline Hcl Other (See Comments)    Urinary problems and insomnia   Tape Other (See Comments)    SKIN IS FRAGILE- CERTAIN BANDAGES TEAR OFF THE SKIN      MEDICATIONS:  Current Outpatient Medications on File Prior to Visit  Medication Sig Dispense Refill   acetaminophen (TYLENOL) 325 MG tablet Take 2 tablets (650 mg total) by mouth every 6 (six) hours as needed for moderate pain, headache or mild pain. 30 tablet 0   amoxicillin (AMOXIL) 500 MG capsule Take 2 capsules by mouth now  then 1 three times daily until gone 23 capsule 0   amoxicillin-clavulanate (AUGMENTIN) 500-125 MG tablet Take 1 tablet by mouth twice a day 14 tablet 0   apixaban (ELIQUIS) 5 MG TABS tablet Take 1 tablet (5 mg total) by mouth 2 (two) times daily. 60 tablet 0   apixaban (ELIQUIS) 5 MG TABS tablet Take 1 tablet by mouth twice a day 180 tablet 0   carvedilol (COREG) 25 MG tablet Take one tablet by mouth once daily in the evening. (Patient taking differently: Take 25 mg by mouth at bedtime.) 90 tablet 4   cephALEXin (KEFLEX) 500 MG capsule Take 1 capsule (500 mg total) by mouth 2 (two) times daily. 14 capsule 0   cephALEXin (KEFLEX) 500 MG capsule Take 1 capsule (500 mg total) by mouth 2 (two) times daily. 14 capsule 0   clindamycin (CLEOCIN) 150 MG capsule Take 2 capsules by mouth now, then take 1 capsule 3 times a day until gone 22 capsule 0   Continuous Blood Gluc Receiver (FREESTYLE LIBRE 2 READER) DEVI use as directed to check blood sugar 1 each 0   Continuous Blood Gluc Sensor (FREESTYLE LIBRE 2 SENSOR) MISC Use as directed 6 each 3   dexamethasone-nystatin Rinse with 1 teaspoonful (37mls) and expectorate 2 times daily until gone 470 mL 0   furosemide (LASIX) 20 MG tablet Take 20 mg by mouth daily as needed for edema.     glucagon 1 MG injection Inject 1 mg into the vein once as needed for low  blood sugar ((give in addition to dextrose)). 1 each 0   Homeopathic Products (THERAWORX RELIEF) FOAM Apply 1 application topically daily as needed (for knee cramps).     hydrocortisone (V-R HYDROCORTISONE) 1 % ointment Apply a thin layer to affected area(s) twice a day for 7 days 56.8 g 0   HYDROmorphone (DILAUDID) 8 MG tablet Take 1 tablet by mouth 7 times a day as needed 210 tablet 0   hydrOXYzine (VISTARIL) 25 MG capsule Take 1 capsule by mouth every 8 hours as needed for stress 90 capsule 0   ibuprofen (ADVIL) 800 MG tablet Take 1 tablet by mouth every 4 to 6 hrs as needed for pain 20 tablet 0   insulin aspart (NOVOLOG) 100 UNIT/ML injection USE 100U/DAY SUBCUTANEOUS VIA PUMP 30 DAY(S) (Patient taking differently: Inject 5-30 Units into the skin See admin instructions. Inject 5-30 units into the skin three times a day before meals, PER SLIDING SCALE) 30 mL 6   insulin aspart (NOVOLOG) 100 UNIT/ML injection Inject 10 - 20 units under the skin 3 times daily before meals 60 mL 3   insulin degludec (TRESIBA FLEXTOUCH) 100 UNIT/ML FlexTouch Pen Inject 14 units Subcutaneous Once a day 30 days (Patient taking differently: Inject 14 Units into the skin daily before breakfast.) 15 mL 6   Insulin Syringe-Needle U-100 31G X 5/16" 1 ML MISC Use as directed to inject insulin. 200 each 13   lansoprazole (PREVACID) 30 MG capsule Take one capsule by mouth once daily. (Patient taking differently: Take 30 mg by mouth at bedtime.) 90 capsule 4   lidocaine (LIDODERM) 5 % Place 1 patch onto the skin daily. Remove & Discard patch within 12 hours or as directed by MD 30 patch 0   losartan (COZAAR) 50 MG tablet Take one tablet by mouth every evening. (Patient taking differently: Take 50 mg by mouth at bedtime.) 90 tablet 4   magnesium oxide (MAG-OX) 400 MG tablet Take 800 mg by  mouth at bedtime. Triple complex     megestrol (MEGACE) 40 MG tablet Take one tablet by mouth twice daily. 60 tablet 1   polyethylene glycol  powder (MIRALAX) 17 GM/SCOOP powder Mix 1 scoop (17g) in 8 ounces of water or juice once daily (Patient not taking: No sig reported) 510 g 5   pravastatin (PRAVACHOL) 20 MG tablet Take one tablet by mouth twice weekly. (Patient taking differently: Take 20 mg by mouth 2 (two) times a week.) 40 tablet 4   pregabalin (LYRICA) 25 MG capsule Take 1 capsule (25 mg total) by mouth at bedtime. 30 capsule 0   QUEtiapine (SEROQUEL) 25 MG tablet Take 1 tablet by mouth once a day at bedtime 30 tablet 1   senna (SENOKOT) 8.6 MG TABS tablet Take 1 tablet by mouth in the morning and at bedtime.     tamsulosin (FLOMAX) 0.4 MG CAPS capsule Take 1 capsule by mouth at bedtime 30 capsule 3   triamcinolone cream (KENALOG) 0.1 % Apply externally two times a day for 30 days. 80 g 3   No current facility-administered medications on file prior to visit.     PAST SURGICAL HISTORY Past Surgical History:  Procedure Laterality Date   ANAL FISTULECTOMY N/A 04/01/2013   Procedure: EXAM UNDER ANESTHESIA WITH ANAL FISSURectomy, sphincterotomy and internal hemorrhoidectomy;  Surgeon: Harl Bowie, MD;  Location: [facility];  Service: General;  Laterality: N/A;   APPLICATION OF WOUND VAC N/A 05/16/2015   Procedure: APPLICATION OF WOUND VAC;  Surgeon: Armandina Gemma, MD;  Location: [facility];  Service: General;  Laterality: N/A;   APPLICATION OF WOUND VAC N/A 05/20/2015   Procedure: EXHANGE OF ABDOMINAL  WOUND VAC DRESSING;  Surgeon: Armandina Gemma, MD;  Location: [facility];  Service: General;  Laterality: N/A;   COLONOSCOPY     CYSTOSCOPY WITH INSERTION OF UROLIFT N/A 10/12/2016   Procedure: CYSTOSCOPY WITH INSERTION OF UROLIFT;  Surgeon: Franchot Gallo, MD;  Location: [facility];  Service: Urology;  Laterality: N/A;   EUS N/A 12/31/2014   Procedure: UPPER ENDOSCOPIC ULTRASOUND (EUS) LINEAR;  Surgeon: Milus Banister, MD;  [facility];  Service: Endoscopy;  Laterality: N/A;   HARDWARE  REMOVAL Left 2013   left ankle   HEMORRHOIDECTOMY WITH HEMORRHOID BANDING     INCISION AND DRAINAGE ABSCESS N/A 05/14/2015   Procedure: DRAINAGE OF INFECTED PANCREATIC PSEUDOCYST;  Surgeon: Armandina Gemma, MD;  Location[facility];  Service: General;  Laterality: N/A;   INCISION AND DRAINAGE OF WOUND N/A 05/16/2015   Procedure: IRRIGATION AND DEBRIDEMENT WOUND;  Surgeon: Armandina Gemma, MD;  Location: [facility];  Service: General;  Laterality: N/A;   INCISIONAL HERNIA REPAIR N/A 11/12/2015   Procedure: REPAIR INCISIONAL HERNIA WITH MESH;  Surgeon: Armandina Gemma, MD;  Location: [facility];  Service: General;  Laterality: N/A;   INGUINAL HERNIA REPAIR Right 1974;  [place] N/A 11/12/2015   Procedure: INSERTION OF MESH;  Surgeon: Armandina Gemma, MD;  Location: [facility];  Service: General;  Laterality: N/A;   IR GENERIC HISTORICAL  04/20/2015   IR RADIOLOGIST EVAL & MGMT 04/20/2015 [facility] INTERV RAD   IR IVC FILTER PLMT / S&I Burke Keels GUID/MOD SED  11/25/2020   IR RADIOLOGY PERIPHERAL GUIDED IV START  11/25/2020   IR US GUIDE VASC ACCESS RIGHT  11/25/2020   KNEE ARTHROSCOPY Bilateral right 08-08-2000;  left 11-23-2000   meniscal repair and chondroplasty's   KNEE ARTHROSCOPY  03/01/2012  Procedure: ARTHROSCOPY KNEE;  Surgeon: Gearlean Alf, MD;  Location: [facility];  Service: Orthopedics;  Laterality: Left;  WITH SYNOVECTOMY    LAPAROTOMY N/A 05/14/2015   Procedure: EXPLORATORY LAPAROTOMY;  Surgeon: Armandina Gemma, MD;  [facility];  Service: General;  Laterality: N/A;   MASS EXCISION N/A 12/24/2019   Procedure: EXCISION LIP CARCINOMA;  Surgeon: Leta Baptist, MD;  Location: [facility];  Service: ENT;  Laterality: N/A;   ORIF LEFT ANKLE FX  08/20/2009   distal tib-fib and malleolus   ROTATOR CUFF REPAIR Bilateral right 1994; left 1993, left 1993   TOTAL KNEE ARTHROPLASTY Right 06/02/2013   Procedure: RIGHT TOTAL KNEE ARTHROPLASTY;  Surgeon: Gearlean Alf, MD;  [facility];  Service: Orthopedics;   Laterality: Right;   TOTAL KNEE ARTHROPLASTY Left 01-31-2008   dr Wynelle Link   TOTAL KNEE REVISION Right 01/23/2018   Procedure: right knee polyethylene revision;  Surgeon: Gaynelle Arabian, MD;  Location[facility];  Service: Orthopedics;  Laterality: Right;  42min   TRANSURETHRAL RESECTION OF PROSTATE N/A 04/21/2019   Procedure: TRANSURETHRAL RESECTION OF THE PROSTATE (TURP);  Surgeon: Franchot Gallo, MD;  Location: [facility];  Service: Urology;  Laterality: N/A;   UMBILICAL HERNIA REPAIR  08-11-1999   dr Ninfa Linden   UPPER GASTROINTESTINAL ENDOSCOPY  sept 2016   WOUND EXPLORATION N/A 09/21/2016   Procedure: WOUND EXPLORATION AND DEBRIDEMENT ABDOMINAL WALL;  Surgeon: Armandina Gemma, MD;  [facility];  Service: General;  Laterality: N/A;    FAMILY HISTORY: Family History  Problem Relation Age of Onset   Bone cancer Father        Jaw   Factor V Leiden deficiency Daughter    Diabetes Maternal Grandmother    Heart disease Paternal Uncle    Colon cancer Neg Hx    Esophageal cancer Neg Hx    Stomach cancer Neg Hx    Rectal cancer Neg Hx     SOCIAL HISTORY:  reports that he quit smoking about 46 years ago. His smoking use included cigarettes. He has a 68.00 pack-year smoking history. He has never used smokeless tobacco. He reports that he does not drink alcohol and does not use drugs.  PERFORMANCE STATUS: The patient's performance status is 1 - Symptomatic but completely ambulatory  PHYSICAL EXAM: Most Recent Vital Signs: There were no vitals taken for this visit. There were no vitals taken for this visit.  General Appearance:    Alert, cooperative, no distress, appears stated age  Head:    Normocephalic, without obvious abnormality, atraumatic  Eyes:    PERRL, conjunctiva/corneas clear, EOM's intact, fundi    benign, both eyes             Throat:   Lips, mucosa, and tongue normal; teeth and gums normal  Neck:   Supple, symmetrical, trachea midline, no adenopathy;        thyroid:  No enlargement/tenderness/nodules; no carotid   bruit or JVD  Back:     Symmetric, no curvature, ROM normal, no CVA tenderness  Lungs:     Clear to auscultation bilaterally, respirations unlabored  Chest wall:    No tenderness or deformity  Heart:    Regular rate and rhythm, S1 and S2 normal, no murmur, rub   or gallop  Abdomen:     Soft, non-tender, bowel sounds active all four quadrants,    no masses, no organomegaly        Extremities:   Extremities normal, atraumatic, no  cyanosis or edema  Pulses:   2+ and symmetric all extremities  Skin:   Skin color, texture, turgor normal, no rashes or lesions  Lymph nodes:   Cervical, supraclavicular, and axillary nodes normal  Neurologic:   CNII-XII intact. Normal strength, sensation and reflexes      throughout    LABORATORY DATA:  Results for orders placed or performed in visit on 03/02/21 (from the past 48 hour(s))  CBC with Differential ([facility] Only)     Status: Abnormal (Preliminary result)   Collection Time: 03/02/21 10:46 AM  Result Value Ref Range   WBC Count 8.1 4.0 - 10.5 K/uL   RBC 3.95 (L) 4.22 - 5.81 MIL/uL   Hemoglobin 11.9 (L) 13.0 - 17.0 g/dL   HCT 36.2 (L) 39.0 - 52.0 %   MCV 91.6 80.0 - 100.0 fL   MCH 30.1 26.0 - 34.0 pg   MCHC 32.9 30.0 - 36.0 g/dL   RDW 13.7 11.5 - 15.5 %   Platelet Count 416 (H) 150 - 400 K/uL   nRBC 0.0 0.0 - 0.2 %    Comment: Performed at [facility], [address], [place], [place] [address]   Neutrophils Relative % PENDING %   Neutro Abs PENDING 1.7 - 7.7 K/uL   Band Neutrophils PENDING %   Lymphocytes Relative PENDING %   Lymphs Abs PENDING 0.7 - 4.0 K/uL   Monocytes Relative PENDING %   Monocytes Absolute PENDING 0.1 - 1.0 K/uL   Eosinophils Relative PENDING %   Eosinophils Absolute PENDING 0.0 - 0.5 K/uL   Basophils Relative PENDING %   Basophils Absolute PENDING 0.0 - 0.1 K/uL   WBC Morphology PENDING    RBC Morphology  PENDING    Smear Review PENDING    Other PENDING %   nRBC PENDING 0 /100 WBC   Metamyelocytes Relative PENDING %   Myelocytes PENDING %   Promyelocytes Relative PENDING %   Blasts PENDING %   Immature Granulocytes PENDING %   Abs Immature Granulocytes PENDING 0.00 - 0.07 K/uL      RADIOGRAPHY: No results found.     PATHOLOGY: None  ASSESSMENT/PLAN: Mr. Baumgardner is a very pleasant 75 to caucasian gentleman with history of hematuria while on anticoagulation. He has history of Factor V Leiden with recurrent right lower extremity DVT while on break from Eliquis for same issue with Hematuria. He now has an IVC filter in place.  Chart, labs and blood smear reviewed with Dr. [name]. Blood smear was unremarkable. No abnormality or evidence of malignancy.  We would recommend the patient hold his Eliquis until Saturday and restart at the same dose.  We do not recommend changing his anticoagulation at this time.  He has an appointment with Urology for further eval for cause of blood loss.  At this time we will follow-up as needed for any future heme onc issues that may arise.   All questions were answered. The patient knows to call the clinic with any problems, questions or concerns. We can certainly see him again if needed.   The patient was discussed with Dr. [name] and he is in agreement with the aforementioned.   Lottie Dawson, NP

## 2021-03-04 ENCOUNTER — Other Ambulatory Visit (HOSPITAL_BASED_OUTPATIENT_CLINIC_OR_DEPARTMENT_OTHER): Payer: Self-pay

## 2021-03-04 ENCOUNTER — Telehealth: Payer: Self-pay | Admitting: *Deleted

## 2021-03-04 NOTE — Telephone Encounter (Signed)
Per 03/02/21 los - no follow-up needed

## 2021-03-07 ENCOUNTER — Other Ambulatory Visit (HOSPITAL_BASED_OUTPATIENT_CLINIC_OR_DEPARTMENT_OTHER): Payer: Self-pay

## 2021-03-08 DIAGNOSIS — G8929 Other chronic pain: Secondary | ICD-10-CM | POA: Diagnosis not present

## 2021-03-08 DIAGNOSIS — E119 Type 2 diabetes mellitus without complications: Secondary | ICD-10-CM | POA: Diagnosis not present

## 2021-03-08 DIAGNOSIS — I10 Essential (primary) hypertension: Secondary | ICD-10-CM | POA: Diagnosis not present

## 2021-03-08 DIAGNOSIS — E1165 Type 2 diabetes mellitus with hyperglycemia: Secondary | ICD-10-CM | POA: Diagnosis not present

## 2021-03-08 DIAGNOSIS — E1169 Type 2 diabetes mellitus with other specified complication: Secondary | ICD-10-CM | POA: Diagnosis not present

## 2021-03-14 DIAGNOSIS — G894 Chronic pain syndrome: Secondary | ICD-10-CM | POA: Diagnosis not present

## 2021-03-14 DIAGNOSIS — M5489 Other dorsalgia: Secondary | ICD-10-CM | POA: Diagnosis not present

## 2021-03-14 DIAGNOSIS — M47816 Spondylosis without myelopathy or radiculopathy, lumbar region: Secondary | ICD-10-CM | POA: Diagnosis not present

## 2021-03-14 DIAGNOSIS — M792 Neuralgia and neuritis, unspecified: Secondary | ICD-10-CM | POA: Diagnosis not present

## 2021-03-14 DIAGNOSIS — M25561 Pain in right knee: Secondary | ICD-10-CM | POA: Diagnosis not present

## 2021-03-14 DIAGNOSIS — R109 Unspecified abdominal pain: Secondary | ICD-10-CM | POA: Diagnosis not present

## 2021-03-14 DIAGNOSIS — R310 Gross hematuria: Secondary | ICD-10-CM | POA: Diagnosis not present

## 2021-03-21 ENCOUNTER — Other Ambulatory Visit (HOSPITAL_BASED_OUTPATIENT_CLINIC_OR_DEPARTMENT_OTHER): Payer: Self-pay

## 2021-03-21 DIAGNOSIS — M792 Neuralgia and neuritis, unspecified: Secondary | ICD-10-CM | POA: Diagnosis not present

## 2021-03-21 DIAGNOSIS — M47816 Spondylosis without myelopathy or radiculopathy, lumbar region: Secondary | ICD-10-CM | POA: Diagnosis not present

## 2021-03-21 DIAGNOSIS — M5126 Other intervertebral disc displacement, lumbar region: Secondary | ICD-10-CM | POA: Diagnosis not present

## 2021-03-21 DIAGNOSIS — M5136 Other intervertebral disc degeneration, lumbar region: Secondary | ICD-10-CM | POA: Diagnosis not present

## 2021-03-21 DIAGNOSIS — M5489 Other dorsalgia: Secondary | ICD-10-CM | POA: Diagnosis not present

## 2021-03-21 DIAGNOSIS — M48061 Spinal stenosis, lumbar region without neurogenic claudication: Secondary | ICD-10-CM | POA: Diagnosis not present

## 2021-03-22 DIAGNOSIS — R319 Hematuria, unspecified: Secondary | ICD-10-CM | POA: Diagnosis not present

## 2021-03-22 DIAGNOSIS — R310 Gross hematuria: Secondary | ICD-10-CM | POA: Diagnosis not present

## 2021-03-23 ENCOUNTER — Other Ambulatory Visit (HOSPITAL_BASED_OUTPATIENT_CLINIC_OR_DEPARTMENT_OTHER): Payer: Self-pay

## 2021-03-25 ENCOUNTER — Other Ambulatory Visit (HOSPITAL_BASED_OUTPATIENT_CLINIC_OR_DEPARTMENT_OTHER): Payer: Self-pay

## 2021-03-28 ENCOUNTER — Other Ambulatory Visit (HOSPITAL_BASED_OUTPATIENT_CLINIC_OR_DEPARTMENT_OTHER): Payer: Self-pay

## 2021-03-30 DIAGNOSIS — Z6835 Body mass index (BMI) 35.0-35.9, adult: Secondary | ICD-10-CM | POA: Diagnosis not present

## 2021-03-30 DIAGNOSIS — Z794 Long term (current) use of insulin: Secondary | ICD-10-CM | POA: Diagnosis not present

## 2021-03-30 DIAGNOSIS — E1165 Type 2 diabetes mellitus with hyperglycemia: Secondary | ICD-10-CM | POA: Diagnosis not present

## 2021-03-30 DIAGNOSIS — Z7985 Long-term (current) use of injectable non-insulin antidiabetic drugs: Secondary | ICD-10-CM | POA: Diagnosis not present

## 2021-04-02 ENCOUNTER — Other Ambulatory Visit (HOSPITAL_BASED_OUTPATIENT_CLINIC_OR_DEPARTMENT_OTHER): Payer: Self-pay

## 2021-04-04 ENCOUNTER — Other Ambulatory Visit (HOSPITAL_BASED_OUTPATIENT_CLINIC_OR_DEPARTMENT_OTHER): Payer: Self-pay

## 2021-04-11 ENCOUNTER — Other Ambulatory Visit (HOSPITAL_BASED_OUTPATIENT_CLINIC_OR_DEPARTMENT_OTHER): Payer: Self-pay

## 2021-04-13 ENCOUNTER — Other Ambulatory Visit (HOSPITAL_BASED_OUTPATIENT_CLINIC_OR_DEPARTMENT_OTHER): Payer: Self-pay

## 2021-04-13 MED ORDER — MEGESTROL ACETATE 40 MG PO TABS
40.0000 mg | ORAL_TABLET | Freq: Two times a day (BID) | ORAL | 1 refills | Status: DC
  Filled 2021-04-13: qty 60, 30d supply, fill #0
  Filled 2021-06-03: qty 60, 30d supply, fill #1

## 2021-04-15 ENCOUNTER — Other Ambulatory Visit: Payer: Self-pay | Admitting: Urology

## 2021-04-19 ENCOUNTER — Other Ambulatory Visit (HOSPITAL_BASED_OUTPATIENT_CLINIC_OR_DEPARTMENT_OTHER): Payer: Self-pay

## 2021-04-19 MED ORDER — PREGABALIN 150 MG PO CAPS
ORAL_CAPSULE | ORAL | 2 refills | Status: DC
  Filled 2021-04-19: qty 60, 30d supply, fill #0
  Filled 2021-05-26: qty 60, 30d supply, fill #1

## 2021-04-19 MED ORDER — ELIQUIS 5 MG PO TABS
5.0000 mg | ORAL_TABLET | Freq: Two times a day (BID) | ORAL | 1 refills | Status: DC
  Filled 2021-04-19 – 2021-04-21 (×3): qty 180, 90d supply, fill #0

## 2021-04-20 ENCOUNTER — Other Ambulatory Visit (HOSPITAL_BASED_OUTPATIENT_CLINIC_OR_DEPARTMENT_OTHER): Payer: Self-pay

## 2021-04-21 ENCOUNTER — Other Ambulatory Visit (HOSPITAL_BASED_OUTPATIENT_CLINIC_OR_DEPARTMENT_OTHER): Payer: Self-pay

## 2021-04-22 ENCOUNTER — Other Ambulatory Visit (HOSPITAL_BASED_OUTPATIENT_CLINIC_OR_DEPARTMENT_OTHER): Payer: Self-pay

## 2021-04-27 DIAGNOSIS — E1169 Type 2 diabetes mellitus with other specified complication: Secondary | ICD-10-CM | POA: Diagnosis not present

## 2021-04-27 DIAGNOSIS — Z6835 Body mass index (BMI) 35.0-35.9, adult: Secondary | ICD-10-CM | POA: Diagnosis not present

## 2021-04-27 DIAGNOSIS — E119 Type 2 diabetes mellitus without complications: Secondary | ICD-10-CM | POA: Diagnosis not present

## 2021-04-27 DIAGNOSIS — E1165 Type 2 diabetes mellitus with hyperglycemia: Secondary | ICD-10-CM | POA: Diagnosis not present

## 2021-05-02 ENCOUNTER — Other Ambulatory Visit (HOSPITAL_BASED_OUTPATIENT_CLINIC_OR_DEPARTMENT_OTHER): Payer: Self-pay

## 2021-05-05 DIAGNOSIS — Z85819 Personal history of malignant neoplasm of unspecified site of lip, oral cavity, and pharynx: Secondary | ICD-10-CM | POA: Diagnosis not present

## 2021-05-09 DIAGNOSIS — R310 Gross hematuria: Secondary | ICD-10-CM | POA: Diagnosis not present

## 2021-05-09 DIAGNOSIS — R350 Frequency of micturition: Secondary | ICD-10-CM | POA: Diagnosis not present

## 2021-05-09 DIAGNOSIS — N401 Enlarged prostate with lower urinary tract symptoms: Secondary | ICD-10-CM | POA: Diagnosis not present

## 2021-05-12 DIAGNOSIS — D692 Other nonthrombocytopenic purpura: Secondary | ICD-10-CM | POA: Diagnosis not present

## 2021-05-12 DIAGNOSIS — E1165 Type 2 diabetes mellitus with hyperglycemia: Secondary | ICD-10-CM | POA: Diagnosis not present

## 2021-05-12 DIAGNOSIS — F331 Major depressive disorder, recurrent, moderate: Secondary | ICD-10-CM | POA: Diagnosis not present

## 2021-05-17 ENCOUNTER — Encounter (HOSPITAL_BASED_OUTPATIENT_CLINIC_OR_DEPARTMENT_OTHER): Payer: Self-pay | Admitting: Urology

## 2021-05-17 ENCOUNTER — Other Ambulatory Visit: Payer: Self-pay

## 2021-05-17 NOTE — Progress Notes (Addendum)
Spoke w/ via phone for pre-op interview: patient and wife, Derek [name] ?Lab needs dos: I-Stat ?Lab results: EKG 11/17/20 ?COVID test: patient states asymptomatic no test needed. ?Arrive at 0545 05/23/21 ?NPO after MN except clear liquids.Clear liquids from MN until 0445 ?Med rec completed. ?Medications to take morning of surgery:lyrica   ?Diabetic medication: none morning of surgery ?Patient instructed no nail polish to be worn day of surgery. ?Patient instructed to bring photo id and insurance card day of surgery. ?Patient aware to have Driver (ride ) / caregiver for 24 hours after surgery. (Wife to drive) ?Patient Special Instructions: pt to hold Eliquis 2 days before surgery per Dr. Harrington Challenger ?Pre-Op special Istructions: NA ?Patient verbalized understanding of instructions that were given at this phone interview. ?Patient denies shortness of breath, chest pain, fever, cough at this phone interview.  ? ?Pt A1C 10.9 03/16/21 in care everywhere and 04/19/21 sx cancelled d/t BS 453. Chart reviewed with Dr. Ermalene Postin, anesthesiologist. Faythe Ghee to proceed with surgery as planned barring any acute changes in patient status and will check blood sugar DOS to determine if surgery can proceed. ?

## 2021-05-19 NOTE — Progress Notes (Signed)
Received fax from dr c Melinda Crutch patient pcp to hold eliquis 2 days before surgery and pt medically cleared for 05-23-2021 surgery at [facility], medical clearance for 05-23-2021 surgery.placed on pt chart. ?

## 2021-05-22 ENCOUNTER — Encounter (HOSPITAL_BASED_OUTPATIENT_CLINIC_OR_DEPARTMENT_OTHER): Payer: Self-pay | Admitting: Urology

## 2021-05-22 NOTE — Anesthesia Preprocedure Evaluation (Addendum)
Anesthesia Evaluation  ?Patient identified by MRN, date of birth, ID band ?Patient awake ? ? ? ?Reviewed: ?Allergy & Precautions, NPO status , Patient's Chart, lab work & pertinent test results, reviewed documented beta blocker date and time  ? ?Airway ?Mallampati: II ? ?TM Distance: >3 FB ?Neck ROM: Full ? ? ? Dental ? ?(+) Edentulous Lower, Dental Advisory Given ?  ?Pulmonary ?shortness of breath and with exertion, sleep apnea , former smoker,  ?  ?Pulmonary exam normal ?breath sounds clear to auscultation ? ? ? ? ? ? Cardiovascular ?hypertension, Pt. on medications ?+ Peripheral Vascular Disease  ?Normal cardiovascular exam ?Rhythm:Regular Rate:Normal ? ?Decreased pulses right LE ? ?Hx/o DVT 10/22 ? ?EKG 10/28/20 ?NSR, RBBB, LAFB ?  ?Neuro/Psych ?PSYCHIATRIC DISORDERS Anxiety Depression Hx/o panic attacksMeniere's disease ?Deafnes right ear ? Neuromuscular disease   ? GI/Hepatic ?Neg liver ROS, hiatal hernia, GERD  Medicated and Controlled,Hx/o neuroendocrine tumor of pancreas S/P distal pancreatectomy and splenectomy ?Hx/o infected pancreatic pseudocyst ?  ?Endo/Other  ?diabetes, Poorly Controlled, Type 2, Insulin DependentHyperlipidemia ?Hypercalcemia ?Obesity ? Renal/GU ?Renal InsufficiencyRenal diseaseHematuria ?Hx/o renal calculi  ? ?Bladder lesion ? ?  ?Musculoskeletal ? ?(+) Arthritis , Osteoarthritis,  Chronic LBP  ? Abdominal ?(+) + obese,   ?Peds ? Hematology ?Hx/o Factor V Leiden deficiency ?Eliquis therapy- last dose 1 week ago   ?Anesthesia Other Findings ? ? Reproductive/Obstetrics ? ?  ? ? ? ? ? ? ? ? ? ? ? ? ? ?  ?  ? ? ? ? ?Anesthesia Physical ?Anesthesia Plan ? ?ASA: 3 ? ?Anesthesia Plan: General  ? ?Post-op Pain Management: Minimal or no pain anticipated  ? ?Induction: Intravenous ? ?PONV Risk Score and Plan: 3 and Treatment may vary due to age or medical condition and Ondansetron ? ?Airway Management Planned: LMA ? ?Additional Equipment: None ? ?Intra-op Plan:   ? ?Post-operative Plan: Extubation in OR ? ?Informed Consent: I have reviewed the patients History and Physical, chart, labs and discussed the procedure including the risks, benefits and alternatives for the proposed anesthesia with the patient or authorized representative who has indicated his/her understanding and acceptance.  ? ? ? ?Dental advisory given ? ?Plan Discussed with: CRNA and Anesthesiologist ? ?Anesthesia Plan Comments:   ? ? ? ? ? ?Anesthesia Quick Evaluation ? ?

## 2021-05-23 ENCOUNTER — Other Ambulatory Visit (HOSPITAL_BASED_OUTPATIENT_CLINIC_OR_DEPARTMENT_OTHER): Payer: Self-pay

## 2021-05-23 ENCOUNTER — Ambulatory Visit (HOSPITAL_BASED_OUTPATIENT_CLINIC_OR_DEPARTMENT_OTHER): Payer: HMO | Admitting: Anesthesiology

## 2021-05-23 ENCOUNTER — Encounter (HOSPITAL_BASED_OUTPATIENT_CLINIC_OR_DEPARTMENT_OTHER)
Admission: RE | Disposition: A | Discharge status: Home / Self Care | Payer: Self-pay | Source: Ambulatory Visit | Attending: Urology

## 2021-05-23 ENCOUNTER — Ambulatory Visit (HOSPITAL_BASED_OUTPATIENT_CLINIC_OR_DEPARTMENT_OTHER)
Admission: RE | Admit: 2021-05-23 | Discharge: 2021-05-23 | Disposition: A | Discharge status: Home / Self Care | Payer: HMO | Source: Ambulatory Visit | Attending: Urology | Admitting: Urology

## 2021-05-23 ENCOUNTER — Other Ambulatory Visit: Payer: Self-pay

## 2021-05-23 ENCOUNTER — Encounter (HOSPITAL_BASED_OUTPATIENT_CLINIC_OR_DEPARTMENT_OTHER): Payer: Self-pay | Admitting: Urology

## 2021-05-23 DIAGNOSIS — R310 Gross hematuria: Secondary | ICD-10-CM

## 2021-05-23 DIAGNOSIS — D6851 Activated protein C resistance: Secondary | ICD-10-CM | POA: Diagnosis not present

## 2021-05-23 DIAGNOSIS — N323 Diverticulum of bladder: Secondary | ICD-10-CM

## 2021-05-23 DIAGNOSIS — G4730 Sleep apnea, unspecified: Secondary | ICD-10-CM | POA: Diagnosis not present

## 2021-05-23 DIAGNOSIS — Z90411 Acquired partial absence of pancreas: Secondary | ICD-10-CM | POA: Insufficient documentation

## 2021-05-23 DIAGNOSIS — E1151 Type 2 diabetes mellitus with diabetic peripheral angiopathy without gangrene: Secondary | ICD-10-CM

## 2021-05-23 DIAGNOSIS — K449 Diaphragmatic hernia without obstruction or gangrene: Secondary | ICD-10-CM | POA: Insufficient documentation

## 2021-05-23 DIAGNOSIS — R0602 Shortness of breath: Secondary | ICD-10-CM | POA: Insufficient documentation

## 2021-05-23 DIAGNOSIS — Z7901 Long term (current) use of anticoagulants: Secondary | ICD-10-CM | POA: Insufficient documentation

## 2021-05-23 DIAGNOSIS — Z87442 Personal history of urinary calculi: Secondary | ICD-10-CM | POA: Insufficient documentation

## 2021-05-23 DIAGNOSIS — Z9081 Acquired absence of spleen: Secondary | ICD-10-CM | POA: Insufficient documentation

## 2021-05-23 DIAGNOSIS — M1990 Unspecified osteoarthritis, unspecified site: Secondary | ICD-10-CM | POA: Diagnosis not present

## 2021-05-23 DIAGNOSIS — E669 Obesity, unspecified: Secondary | ICD-10-CM | POA: Insufficient documentation

## 2021-05-23 DIAGNOSIS — K219 Gastro-esophageal reflux disease without esophagitis: Secondary | ICD-10-CM | POA: Insufficient documentation

## 2021-05-23 DIAGNOSIS — N3021 Other chronic cystitis with hematuria: Secondary | ICD-10-CM | POA: Diagnosis not present

## 2021-05-23 DIAGNOSIS — N3001 Acute cystitis with hematuria: Secondary | ICD-10-CM | POA: Diagnosis not present

## 2021-05-23 DIAGNOSIS — N3081 Other cystitis with hematuria: Secondary | ICD-10-CM | POA: Diagnosis not present

## 2021-05-23 DIAGNOSIS — Z87891 Personal history of nicotine dependence: Secondary | ICD-10-CM | POA: Diagnosis not present

## 2021-05-23 DIAGNOSIS — Z794 Long term (current) use of insulin: Secondary | ICD-10-CM | POA: Diagnosis not present

## 2021-05-23 DIAGNOSIS — N3281 Overactive bladder: Secondary | ICD-10-CM | POA: Diagnosis not present

## 2021-05-23 DIAGNOSIS — I4510 Unspecified right bundle-branch block: Secondary | ICD-10-CM | POA: Diagnosis not present

## 2021-05-23 DIAGNOSIS — Z86718 Personal history of other venous thrombosis and embolism: Secondary | ICD-10-CM | POA: Insufficient documentation

## 2021-05-23 DIAGNOSIS — I10 Essential (primary) hypertension: Secondary | ICD-10-CM | POA: Insufficient documentation

## 2021-05-23 DIAGNOSIS — E785 Hyperlipidemia, unspecified: Secondary | ICD-10-CM | POA: Diagnosis not present

## 2021-05-23 DIAGNOSIS — Z6834 Body mass index (BMI) 34.0-34.9, adult: Secondary | ICD-10-CM | POA: Diagnosis not present

## 2021-05-23 HISTORY — DX: Pleural effusion, not elsewhere classified: J90

## 2021-05-23 HISTORY — PX: CYSTOSCOPY: SHX5120

## 2021-05-23 HISTORY — PX: TRANSURETHRAL RESECTION OF BLADDER TUMOR: SHX2575

## 2021-05-23 LAB — POCT I-STAT, CHEM 8
BUN: 22 mg/dL (ref 8–23)
Calcium, Ion: 1.57 mmol/L (ref 1.15–1.40)
Chloride: 111 mmol/L (ref 98–111)
Creatinine, Ser: 0.9 mg/dL (ref 0.61–1.24)
Glucose, Bld: 260 mg/dL — ABNORMAL HIGH (ref 70–99)
HCT: 39 % (ref 39.0–52.0)
Hemoglobin: 13.3 g/dL (ref 13.0–17.0)
Potassium: 4.3 mmol/L (ref 3.5–5.1)
Sodium: 141 mmol/L (ref 135–145)
TCO2: 19 mmol/L — ABNORMAL LOW (ref 22–32)

## 2021-05-23 LAB — GLUCOSE, CAPILLARY: Glucose-Capillary: 297 mg/dL — ABNORMAL HIGH (ref 70–99)

## 2021-05-23 SURGERY — CYSTOSCOPY
Anesthesia: General | Site: Bladder

## 2021-05-23 MED ORDER — INSULIN ASPART 100 UNIT/ML IJ SOLN: 8.0000 [IU] | Freq: Once | INTRAMUSCULAR | Status: DC

## 2021-05-23 MED ORDER — PROPOFOL 1000 MG/100ML IV EMUL
INTRAVENOUS | Status: AC
  Filled 2021-05-23: qty 100

## 2021-05-23 MED ORDER — ONDANSETRON HCL 4 MG/2ML IJ SOLN: 4.0000 mg | Freq: Once | INTRAMUSCULAR | Status: DC | PRN

## 2021-05-23 MED ORDER — FENTANYL CITRATE (PF) 100 MCG/2ML IJ SOLN
INTRAMUSCULAR | Status: AC
  Filled 2021-05-23: qty 2

## 2021-05-23 MED ORDER — CEFAZOLIN SODIUM-DEXTROSE 2-4 GM/100ML-% IV SOLN
2.0000 g | INTRAVENOUS | Status: AC
  Administered 2021-05-23: 2 g via INTRAVENOUS

## 2021-05-23 MED ORDER — SODIUM CHLORIDE 0.9 % IR SOLN
Status: DC | PRN
  Administered 2021-05-23: 3000 mL

## 2021-05-23 MED ORDER — SCOPOLAMINE 1 MG/3DAYS TD PT72
MEDICATED_PATCH | TRANSDERMAL | Status: AC
  Filled 2021-05-23: qty 1

## 2021-05-23 MED ORDER — CEPHALEXIN 500 MG PO CAPS
500.0000 mg | ORAL_CAPSULE | Freq: Two times a day (BID) | ORAL | 0 refills | Status: AC
  Filled 2021-05-23: qty 6, 3d supply, fill #0

## 2021-05-23 MED ORDER — LIDOCAINE HCL (PF) 2 % IJ SOLN
INTRAMUSCULAR | Status: AC
  Filled 2021-05-23: qty 5

## 2021-05-23 MED ORDER — INSULIN ASPART 100 UNIT/ML IJ SOLN
INTRAMUSCULAR | Status: AC
  Filled 2021-05-23: qty 1

## 2021-05-23 MED ORDER — FENTANYL CITRATE (PF) 100 MCG/2ML IJ SOLN: 25.0000 ug | INTRAMUSCULAR | Status: DC | PRN

## 2021-05-23 MED ORDER — TAMSULOSIN HCL 0.4 MG PO CAPS
ORAL_CAPSULE | ORAL | 3 refills | Status: DC
  Filled 2021-05-23: qty 30, 30d supply, fill #0
  Filled 2021-06-22: qty 30, 30d supply, fill #1
  Filled 2021-07-19: qty 30, 30d supply, fill #2
  Filled 2021-08-15: qty 30, 30d supply, fill #3

## 2021-05-23 MED ORDER — STERILE WATER FOR IRRIGATION IR SOLN
Status: DC | PRN
  Administered 2021-05-23: 500 mL

## 2021-05-23 MED ORDER — CEFAZOLIN SODIUM-DEXTROSE 2-4 GM/100ML-% IV SOLN
INTRAVENOUS | Status: AC
  Filled 2021-05-23: qty 100

## 2021-05-23 MED ORDER — WHITE PETROLATUM EX OINT
TOPICAL_OINTMENT | CUTANEOUS | Status: AC
  Filled 2021-05-23: qty 5

## 2021-05-23 MED ORDER — ONDANSETRON HCL 4 MG/2ML IJ SOLN
INTRAMUSCULAR | Status: DC | PRN
  Administered 2021-05-23: 4 mg via INTRAVENOUS

## 2021-05-23 MED ORDER — ONDANSETRON HCL 4 MG/2ML IJ SOLN
INTRAMUSCULAR | Status: AC
  Filled 2021-05-23: qty 2

## 2021-05-23 MED ORDER — STERILE WATER FOR IRRIGATION IR SOLN
Status: DC | PRN
  Administered 2021-05-23: 600 mL

## 2021-05-23 MED ORDER — PROPOFOL 10 MG/ML IV BOLUS
INTRAVENOUS | Status: DC | PRN
  Administered 2021-05-23: 120 mg via INTRAVENOUS

## 2021-05-23 MED ORDER — PROPOFOL 10 MG/ML IV BOLUS
INTRAVENOUS | Status: AC
  Filled 2021-05-23: qty 20

## 2021-05-23 MED ORDER — INSULIN ASPART 100 UNIT/ML IJ SOLN
8.0000 [IU] | Freq: Once | INTRAMUSCULAR | Status: AC
  Administered 2021-05-23: 100 [IU] via SUBCUTANEOUS

## 2021-05-23 MED ORDER — PROPOFOL 500 MG/50ML IV EMUL
INTRAVENOUS | Status: AC
  Filled 2021-05-23: qty 100

## 2021-05-23 MED ORDER — LACTATED RINGERS IV SOLN: INTRAVENOUS | Status: DC

## 2021-05-23 MED ORDER — ALBUTEROL SULFATE HFA 108 (90 BASE) MCG/ACT IN AERS
INHALATION_SPRAY | RESPIRATORY_TRACT | Status: AC
  Filled 2021-05-23: qty 6.7

## 2021-05-23 MED ORDER — DEXMEDETOMIDINE (PRECEDEX) IN NS 20 MCG/5ML (4 MCG/ML) IV SYRINGE
PREFILLED_SYRINGE | INTRAVENOUS | Status: AC
  Filled 2021-05-23: qty 5

## 2021-05-23 MED ORDER — LIDOCAINE 2% (20 MG/ML) 5 ML SYRINGE
INTRAMUSCULAR | Status: DC | PRN
  Administered 2021-05-23: 60 mg via INTRAVENOUS

## 2021-05-23 MED ORDER — FENTANYL CITRATE (PF) 100 MCG/2ML IJ SOLN
INTRAMUSCULAR | Status: DC | PRN
  Administered 2021-05-23: 75 ug via INTRAVENOUS
  Administered 2021-05-23: 25 ug via INTRAVENOUS

## 2021-05-23 SURGICAL SUPPLY — 31 items
BAG DRAIN URO-CYSTO SKYTR STRL (DRAIN) ×2 IMPLANT
BAG DRN RND TRDRP ANRFLXCHMBR (UROLOGICAL SUPPLIES) ×1
BAG DRN UROCATH (DRAIN) ×1
BAG URINE DRAIN 2000ML AR STRL (UROLOGICAL SUPPLIES) ×1 IMPLANT
BAG URINE LEG 500ML (DRAIN) IMPLANT
CATH FOLEY 2WAY SLVR  5CC 20FR (CATHETERS) ×2
CATH FOLEY 2WAY SLVR  5CC 22FR (CATHETERS)
CATH FOLEY 2WAY SLVR 5CC 20FR (CATHETERS) IMPLANT
CATH FOLEY 2WAY SLVR 5CC 22FR (CATHETERS) IMPLANT
CATH FOLEY 3WAY 30CC 22FR (CATHETERS) IMPLANT
CLOTH BEACON ORANGE TIMEOUT ST (SAFETY) ×2 IMPLANT
ELECT REM PT RETURN 9FT ADLT (ELECTROSURGICAL) ×2
ELECTRODE REM PT RTRN 9FT ADLT (ELECTROSURGICAL) ×1 IMPLANT
EVACUATOR MICROVAS BLADDER (UROLOGICAL SUPPLIES) IMPLANT
GLOVE SURG ENC MOIS LTX SZ8 (GLOVE) ×2 IMPLANT
GOWN STRL REUS W/TWL XL LVL3 (GOWN DISPOSABLE) ×2 IMPLANT
HOLDER FOLEY CATH W/STRAP (MISCELLANEOUS) ×1 IMPLANT
IV NS IRRIG 3000ML ARTHROMATIC (IV SOLUTION) ×3 IMPLANT
KIT TURNOVER CYSTO (KITS) ×2 IMPLANT
LOOP CUT BIPOLAR 24F LRG (ELECTROSURGICAL) ×2 IMPLANT
MANIFOLD NEPTUNE II (INSTRUMENTS) ×2 IMPLANT
NEEDLE HYPO 22GX1.5 SAFETY (NEEDLE) IMPLANT
NS IRRIG 500ML POUR BTL (IV SOLUTION) ×1 IMPLANT
PACK CYSTO (CUSTOM PROCEDURE TRAY) ×2 IMPLANT
PLUG CATH AND CAP STER (CATHETERS) IMPLANT
SYR TOOMEY IRRIG 70ML (MISCELLANEOUS) ×2
SYRINGE TOOMEY IRRIG 70ML (MISCELLANEOUS) ×1 IMPLANT
TUBE CONNECTING 12X1/4 (SUCTIONS) ×2 IMPLANT
TUBING UROLOGY SET (TUBING) IMPLANT
WATER STERILE IRR 3000ML UROMA (IV SOLUTION) ×2 IMPLANT
WATER STERILE IRR 500ML POUR (IV SOLUTION) ×1 IMPLANT

## 2021-05-23 NOTE — H&P (Signed)
H&P  Chief Complaint: Blood in urine, bladder abnormalities  History of Present Illness: 76 year old male with prior history of BPH/outlet obstruction, having undergone a UroLift in 2018 and subsequently a TURP in 2021.  He has had recurrent gross painless hematuria.  Evaluation included CT abdomen pelvis with hematuria protocol showing normal upper tracts but significant posterior bladder wall thickening.  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  He has recurrent/persistent hematuria and is on Eliquis.  He presents at this time for cystoscopy, resection of this abnormal area with cauterization.  He has been on a temporary vacation from his Eliquis.  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" no pulmonary md, sees dr [name]   Factor V Leiden mutation ([place])    "never had blood clots"   GERD (gastroesophageal reflux disease)    Hiatal hernia    History of kidney stones    multiple kidney stones-not a problem at present   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, pT1 pNX) no further treatment   History of panic attacks    History 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    Past Surgical History:  Procedure Laterality Date   ANAL FISTULECTOMY N/A 04/01/2013   Procedure: EXAM UNDER ANESTHESIA WITH ANAL FISSURectomy, sphincterotomy and internal hemorrhoidectomy;  Surgeon: Harl Bowie, MD;  Location: [place];  Service: General;  Laterality: N/A;   APPLICATION OF WOUND VAC N/A 05/16/2015   Procedure: APPLICATION OF WOUND VAC;  Surgeon: Armandina Gemma, MD;  Location: [facility];  Service: General;  Laterality: N/A;   APPLICATION OF WOUND VAC N/A 05/20/2015   Procedure: EXHANGE OF ABDOMINAL  WOUND VAC DRESSING;  Surgeon: Armandina Gemma, MD;  Location: [facility];  Service: General;  Laterality: N/A;   COLONOSCOPY     CYSTOSCOPY WITH INSERTION OF UROLIFT N/A 10/12/2016   Procedure: CYSTOSCOPY WITH INSERTION OF UROLIFT;  Surgeon: Franchot Gallo, MD;  Location[facility];  Service: Urology;  Laterality: N/A;   EUS N/A 12/31/2014   Procedure: UPPER ENDOSCOPIC ULTRASOUND (EUS) LINEAR;  Surgeon: Quillian Quince  Merrily Brittle, MD;  Location: [facility];  Service: Endoscopy;  Laterality: N/A;   HARDWARE REMOVAL Left 2013   left ankle   HEMORRHOIDECTOMY WITH HEMORRHOID BANDING     INCISION AND DRAINAGE ABSCESS N/A 05/14/2015   Procedure: DRAINAGE OF INFECTED PANCREATIC PSEUDOCYST;  Surgeon: Armandina Gemma, MD;  Location: [facility];  Service: General;  Laterality: N/A;   INCISION AND DRAINAGE OF WOUND N/A 05/16/2015   Procedure: IRRIGATION AND DEBRIDEMENT WOUND;  Surgeon: Armandina Gemma, MD;  Location: [facility];  Service: General;  Laterality: N/A;   INCISIONAL HERNIA REPAIR N/A 11/12/2015   Procedure: REPAIR INCISIONAL HERNIA WITH  MESH;  Surgeon: Armandina Gemma, MD;  Location: [place];  Service: General;  Laterality: N/A;   INGUINAL HERNIA REPAIR Right 1974;  [place] N/A 11/12/2015   Procedure: INSERTION OF MESH;  Surgeon: Armandina Gemma, MD;  Location: [place];  Service: General;  Laterality: N/A;   IR GENERIC HISTORICAL  04/20/2015   IR RADIOLOGIST EVAL & MGMT 04/20/2015 [facility] INTERV RAD   IR IVC FILTER PLMT / S&I Burke Keels GUID/MOD SED  11/25/2020   IR RADIOLOGY PERIPHERAL GUIDED IV START  11/25/2020   IR US GUIDE VASC ACCESS RIGHT  11/25/2020   KNEE ARTHROSCOPY Bilateral right 08-08-2000;  left 11-23-2000   meniscal repair and chondroplasty's   KNEE ARTHROSCOPY  03/01/2012   Procedure: ARTHROSCOPY KNEE;  Surgeon: Gearlean Alf, MD;  Location: [facility];  Service: Orthopedics;  Laterality: Left;  WITH SYNOVECTOMY    LAPAROTOMY N/A 05/14/2015   Procedure: EXPLORATORY LAPAROTOMY;  Surgeon: Armandina Gemma, MD;  Location: [facility];  Service: General;  Laterality: N/A;   MASS EXCISION N/A 12/24/2019   Procedure: EXCISION LIP CARCINOMA;  Surgeon: Leta Baptist, MD;  Location: [place];  Service: ENT;  Laterality: N/A;   ORIF LEFT ANKLE FX  08/20/2009   distal tib-fib and malleolus   ROTATOR CUFF REPAIR Bilateral right 1994; left 1993, left 1993   TOTAL KNEE ARTHROPLASTY Right 06/02/2013   Procedure: RIGHT TOTAL KNEE ARTHROPLASTY;  Surgeon: Gearlean Alf, MD;  Location: [facility];  Service: Orthopedics;  Laterality: Right;   TOTAL KNEE ARTHROPLASTY Left 01-31-2008   dr Wynelle Link   TOTAL KNEE REVISION Right 01/23/2018   Procedure: right knee polyethylene revision;  Surgeon: Gaynelle Arabian, MD;  Location: [facility];  Service: Orthopedics;  Laterality: Right;  94mn   TRANSURETHRAL RESECTION OF PROSTATE N/A 04/21/2019   Procedure: TRANSURETHRAL RESECTION OF THE PROSTATE (TURP);  Surgeon: DFranchot Gallo MD;  [facility]  Service: Urology;  Laterality: N/A;   UMBILICAL HERNIA REPAIR  08-11-1999   dr  bNinfa Linden  UPPER GASTROINTESTINAL ENDOSCOPY  sept 2016   WOUND EXPLORATION N/A 09/21/2016   Procedure: WOUND EXPLORATION AND DEBRIDEMENT ABDOMINAL WALL;  Surgeon: GArmandina Gemma MD;  Location: [facility];  Service: General;  Laterality: N/A;    Home Medications:    Allergies:  Allergies  Allergen Reactions   Diclofenac Swelling and Other (See Comments)    Elevated liver enzymes and lips swell   Doxycycline Hyclate Other (See Comments)    Epistaxis   Duloxetine Other (See Comments)    Night sweats   Hydrocodone Swelling and Other (See Comments)    Facial swelling- can tolerate "immediate-release" Hydrocodone, though   Oxybutynin Swelling    Swelling    Quinine Other (See Comments)    Platelets dropped- consumptive coagulopathy   Doxycycline Other (See Comments)    Severe nose bleeds  Duloxetine Hcl Other (See Comments)    Night sweats   Morphine And Related Nausea And Vomiting, Swelling and Other (See Comments)    Facial swelling and lips swell, but tolerates immediate-release oxycodone as well as hydrocodone    Oxycontin [Oxycodone Hcl] Swelling and Other (See Comments)    Lips swell, but tolerates immediate-release oxycodone as well as hydrocodone   Sertraline Other (See Comments)    Can't sleep or urinate   Sertraline Hcl Other (See Comments)    Urinary problems and insomnia   Tape Other (See Comments)    SKIN IS FRAGILE- CERTAIN BANDAGES TEAR OFF THE SKIN   Voltaren [Diclofenac Sodium] Other (See Comments)    Elevated liver enzymes   Gabapentin Other (See Comments)    Unknown Reaction    Family History  Problem Relation Age of Onset   Bone cancer Father        Jaw   Factor V Leiden deficiency Daughter    Diabetes Maternal Grandmother    Heart disease Paternal Uncle    Colon cancer Neg Hx    Esophageal cancer Neg Hx    Stomach cancer Neg Hx    Rectal cancer Neg Hx     Social History:  reports that he quit smoking about 46 years ago. His smoking use included  cigarettes. He has a 68.00 pack-year smoking history. He has never used smokeless tobacco. He reports that he does not drink alcohol and does not use drugs.  ROS: A complete review of systems was performed.  All systems are negative except for pertinent findings as noted.  Physical Exam:  Vital signs in last 24 hours: BP 140/86   Pulse 74   Temp 98.2 F (36.8 C) (Oral)   Resp 18   Ht 6' (1.829 m)   Wt 116.8 kg   SpO2 97%   BMI 34.94 kg/m  Constitutional:  Alert and oriented, No acute distress Cardiovascular: Regular rate  Respiratory: Normal respiratory effort GI: Abdomen is soft, nontender, nondistended, no abdominal masses. No CVAT.  Genitourinary: Normal male phallus, testes are descended bilaterally and non-tender and without masses, scrotum is normal in appearance without lesions or masses, perineum is normal on inspection. Lymphatic: No lymphadenopathy Neurologic: Grossly intact, no focal deficits Psychiatric: Normal mood and affect  I have reviewed prior pt notes  I have reviewed notes from referring/previous physicians  I have reviewed urinalysis results  I have independently reviewed prior imaging  I have reviewed prior PSA results  Impression/Assessment:  Gross hematuria, abnormality of bladder urothelium  Plan:  Cystoscopy, transurethral resection of bladder abnormality

## 2021-05-23 NOTE — Discharge Instructions (Addendum)
You may see some blood in the urine and may have some burning with urination for 48-72 hours. You also may notice that you have to urinate more frequently or urgently after your procedure which is normal.  ?You should call should you develop an inability urinate, fever > 101, persistent nausea and vomiting that prevents you from eating or drinking to stay hydrated.  ?If you have a catheter, you will be taught how to take care of the catheter by the nursing staff prior to discharge from the hospital.  You may periodically feel a strong urge to void with the catheter in place.  This is a bladder spasm and most often can occur when having a bowel movement or moving around. It is typically self-limited and usually will stop after a few minutes.  You may use some Vaseline or Neosporin around the tip of the catheter to reduce friction at the tip of the penis. You may also see some blood in the urine.  A very small amount of blood can make the urine look quite red.  As long as the catheter is draining well, there usually is not a problem.  However, if the catheter is not draining well and is bloody, you should call the office [phone]) to notify us.  If the urine is flowing well and does not have a significant amount of clots, it is okay to remove it on Tuesday morning as instructed by nurses. ?Wait until urine is clear/yellow before restarting your Eliquis ? ?Post Anesthesia Home Care Instructions ? ?Activity: ?Get plenty of rest for the remainder of the day. A responsible adult should stay with you for 24 hours following the procedure.  ?For the next 24 hours, DO NOT: ?-Drive a car ?-Paediatric nurse ?-Drink alcoholic beverages ?-Take any medication unless instructed by your physician ?-Make any legal decisions or sign important papers. ? ?Meals: ?Start with liquid foods such as gelatin or soup. Progress to regular foods as tolerated. Avoid greasy, spicy, heavy foods. If nausea and/or vomiting occur, drink only  clear liquids until the nausea and/or vomiting subsides. Call your physician if vomiting continues. ? ?Special Instructions/Symptoms: ?Your throat may feel dry or sore from the anesthesia or the breathing tube placed in your throat during surgery. If this causes discomfort, gargle with warm salt water. The discomfort should disappear within 24 hours. ? ?. ?   ?

## 2021-05-23 NOTE — Anesthesia Postprocedure Evaluation (Signed)
Anesthesia Post Note ? ?Patient: Derek [name] ? ?Procedure(s) Performed: CYSTOSCOPY (Bladder) ?TRANSURETHRAL BIOPSY OF BLADDER (Bladder) ? ?  ? ?Patient location during evaluation: PACU ?Anesthesia Type: General ?Level of consciousness: awake and alert and oriented ?Pain management: pain level controlled ?Vital Signs Assessment: post-procedure vital signs reviewed and stable ?Respiratory status: spontaneous breathing, nonlabored ventilation and respiratory function stable ?Cardiovascular status: blood pressure returned to baseline and stable ?Postop Assessment: no apparent nausea or vomiting ?Anesthetic complications: no ? ? ?No notable events documented. ? ?Last Vitals:  ?Vitals:  ? 05/23/21 0936 05/23/21 0949  ?BP:  137/76  ?Pulse:  73  ?Resp:  14  ?Temp: 36.7 ?C 36.5 ?C  ?SpO2:  99%  ?  ?Last Pain:  ?Vitals:  ? 05/23/21 0949  ?TempSrc: Oral  ?PainSc: 4   ? ? ?  ?  ?  ?  ?  ?  ? ?Rinoa Garramone A. ? ? ? ? ?

## 2021-05-23 NOTE — Transfer of Care (Signed)
Immediate Anesthesia Transfer of Care Note ? ?Patient: Derek [name] ? ?Procedure(s) Performed: Procedure(s) (LRB): ?CYSTOSCOPY (N/A) ?TRANSURETHRAL BIOPSY OF BLADDER (N/A) ? ?Patient Location: PACU ? ?Anesthesia Type: General ? ?Level of Consciousness: awake, oriented, sedated and patient cooperative ? ?Airway & Oxygen Therapy: Patient Spontanous Breathing and Patient connected to face mask oxygen ? ?Post-op Assessment: Report given to PACU RN and Post -op Vital signs reviewed and stable ? ?Post vital signs: Reviewed and stable ? ?Complications: No apparent anesthesia complications ?Last Vitals:  ?Vitals Value Taken Time  ?BP 135/87 05/23/21 0838  ?Temp    ?Pulse 75 05/23/21 0840  ?Resp 12 05/23/21 0840  ?SpO2 92 % 05/23/21 0840  ?Vitals shown include unvalidated device data. ? ?Last Pain:  ?Vitals:  ? 05/23/21 0609  ?TempSrc: Oral  ?PainSc: 10-Worst pain ever  ?   ? ?Patients Stated Pain Goal: 8 (05/23/21 0721) ? ?Complications: No notable events documented. ?

## 2021-05-23 NOTE — Anesthesia Procedure Notes (Signed)
Procedure Name: LMA Insertion ?Date/Time: 05/23/2021 7:59 AM ?Performed by: Suan Halter, CRNA ?Pre-anesthesia Checklist: Patient identified, Emergency Drugs available, Suction available and Patient being monitored ?Patient Re-evaluated:Patient Re-evaluated prior to induction ?Oxygen Delivery Method: Circle system utilized ?Preoxygenation: Pre-oxygenation with 100% oxygen ?Induction Type: IV induction ?Ventilation: Mask ventilation without difficulty ?LMA: LMA inserted ?LMA Size: 5.0 ?Number of attempts: 1 ?Airway Equipment and Method: Bite block ?Placement Confirmation: positive ETCO2 ?Tube secured with: Tape ?Dental Injury: Teeth and Oropharynx as per pre-operative assessment  ? ? ? ? ?

## 2021-05-23 NOTE — Interval H&P Note (Signed)
History and Physical Interval Note: ? ?05/23/2021 ?7:46 AM ? ?Derek [name]  has presented today for surgery, with the diagnosis of BLADDER LESION, HEMATURIA.  The various methods of treatment have been discussed with the patient and family. After consideration of risks, benefits and other options for treatment, the patient has consented to  Procedure(s): ?CYSTOSCOPY (N/A) ?TRANSURETHRAL BIOPSY OF BLADDER (N/A) as a surgical intervention.  The patient's history has been reviewed, patient examined, no change in status, stable for surgery.  I have reviewed the patient's chart and labs.  Questions were answered to the patient's satisfaction.   ? ? ?[name] [name] ? ? ?

## 2021-05-23 NOTE — Op Note (Addendum)
Preoperative diagnosis: Gross hematuria with bladder wall abnormality ? 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 ?Principal procedure: Cystoscopy, biopsy of neck of bladder diverticulum (area 1 cm in diameter), cauterization of abnormal urothelium ? ?Surgeon: Diona Fanti ? ?Anesthesia: General with LMA ? ?Complications: None ? ?Specimen: Bladder biopsies, for permanent specimen ? ?Estimated blood loss: Less than 25 mL ? ?Indications: 76 year old male with prior urologic history--UroLift in 2018 with eventual TURP in 2021.  He has had intermittent gross painless hematuria for several months.  Cystoscopy in January of this year revealed cystitis cystica-like changes in the posterior bladder wall.  No other urothelial abnormalities were noted.  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  CT abdomen and pelvis hematuria protocol revealed bladder diverticula, thick-walled bladder in the trigonal region, normal upper tracts.  He presents at this time for cystitis, biopsy of abnormal urothelium, cauterization.  I have discussed the procedure to him, risk, complications and need for catheter postoperatively.  He understands and desires to proceed. ? 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  Prostatic fossa was wide open.  There were mild cystitis cystica-like changes at the bladder neck.  1 small suture from the UroLift was present at the 2 o'clock position at the bladder neck.  No other bladder neck abnormalities were noted.  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  The largest was in the right superior trigonal region.  This had cystitis cystica like changes around it.  There was no abnormal urothelium in any of the diverticula. ? ?Description of procedure: The patient was properly identified in the holding area.  Is taken to the operating room where general anesthetic was  administered with the LMA.  He is placed in the dorsolithotomy position.  Genitalia and perineum were prepped, draped, proper timeout performed. ? ?78 French panendoscope passed under direct vision into the bladder.  The stricture was noted and easily traversed.  Inspection of the bladder was performed.  There was some turbid urine within the largest bladder diverticulum to the right and superior to the trigone.  There were no urothelial abnormalities within any of the diverticula.  There were no papillary or nodular lesions seen anywhere within the bladder upon circumferential inspection.  The cold cup forceps were utilized to biopsy several areas in the widemouth diverticular neck.  These were sent for permanent specimen.  I then cauterized the biopsy sites.  I then remove the cystoscope and passed the 26 French resectoscope sheath using the visual obturator.  I then cauterized the entire neck of the large bladder diverticulum.  There was adequate hemostasis.  All of the cystitis cystica like urothelium was ablated.  The small suture present at the 2 o'clock position at the bladder neck was then resected with the loop.  I then inspected the bladder.  No other lesions were seen.  At the bladder neck/prostatic junction, I cauterized the small amount of cystitis cystica like change there.  There was hemostasis, the scope was removed.  I then passed a 36 French Foley catheter, hooked to dependent drainage.  It was filled with 10 cc of water.  The patient was then awakened, taken to the PACU in stable condition, having tolerated the procedure well. ?

## 2021-05-24 LAB — SURGICAL PATHOLOGY

## 2021-05-25 ENCOUNTER — Encounter (HOSPITAL_BASED_OUTPATIENT_CLINIC_OR_DEPARTMENT_OTHER): Payer: Self-pay | Admitting: Urology

## 2021-05-26 ENCOUNTER — Other Ambulatory Visit (HOSPITAL_BASED_OUTPATIENT_CLINIC_OR_DEPARTMENT_OTHER): Payer: Self-pay

## 2021-05-26 MED ORDER — OXYBUTYNIN CHLORIDE 5 MG PO TABS
5.0000 mg | ORAL_TABLET | Freq: Three times a day (TID) | ORAL | 11 refills | Status: DC | PRN
  Filled 2021-05-26: qty 90, 30d supply, fill #0
  Filled 2021-07-27: qty 90, 30d supply, fill #1
  Filled 2021-09-24: qty 90, 30d supply, fill #2

## 2021-05-27 ENCOUNTER — Other Ambulatory Visit (HOSPITAL_BASED_OUTPATIENT_CLINIC_OR_DEPARTMENT_OTHER): Payer: Self-pay

## 2021-05-30 ENCOUNTER — Other Ambulatory Visit (HOSPITAL_BASED_OUTPATIENT_CLINIC_OR_DEPARTMENT_OTHER): Payer: Self-pay

## 2021-05-30 MED ORDER — PREGABALIN 200 MG PO CAPS
ORAL_CAPSULE | ORAL | 2 refills | Status: DC
  Filled 2021-05-30: qty 60, 30d supply, fill #0

## 2021-05-31 DIAGNOSIS — E119 Type 2 diabetes mellitus without complications: Secondary | ICD-10-CM | POA: Diagnosis not present

## 2021-05-31 DIAGNOSIS — B351 Tinea unguium: Secondary | ICD-10-CM | POA: Diagnosis not present

## 2021-05-31 DIAGNOSIS — M79671 Pain in right foot: Secondary | ICD-10-CM | POA: Diagnosis not present

## 2021-05-31 DIAGNOSIS — M79672 Pain in left foot: Secondary | ICD-10-CM | POA: Diagnosis not present

## 2021-06-03 ENCOUNTER — Other Ambulatory Visit (HOSPITAL_BASED_OUTPATIENT_CLINIC_OR_DEPARTMENT_OTHER): Payer: Self-pay

## 2021-06-07 DIAGNOSIS — R338 Other retention of urine: Secondary | ICD-10-CM | POA: Diagnosis not present

## 2021-06-07 DIAGNOSIS — R310 Gross hematuria: Secondary | ICD-10-CM | POA: Diagnosis not present

## 2021-06-16 ENCOUNTER — Other Ambulatory Visit (HOSPITAL_BASED_OUTPATIENT_CLINIC_OR_DEPARTMENT_OTHER): Payer: Self-pay

## 2021-06-16 DIAGNOSIS — E133393 Other specified diabetes mellitus with moderate nonproliferative diabetic retinopathy without macular edema, bilateral: Secondary | ICD-10-CM | POA: Diagnosis not present

## 2021-06-16 DIAGNOSIS — K8681 Exocrine pancreatic insufficiency: Secondary | ICD-10-CM | POA: Diagnosis not present

## 2021-06-16 DIAGNOSIS — Z7985 Long-term (current) use of injectable non-insulin antidiabetic drugs: Secondary | ICD-10-CM | POA: Diagnosis not present

## 2021-06-16 DIAGNOSIS — E1365 Other specified diabetes mellitus with hyperglycemia: Secondary | ICD-10-CM | POA: Diagnosis not present

## 2021-06-16 DIAGNOSIS — Z90411 Acquired partial absence of pancreas: Secondary | ICD-10-CM | POA: Diagnosis not present

## 2021-06-16 DIAGNOSIS — I10 Essential (primary) hypertension: Secondary | ICD-10-CM | POA: Diagnosis not present

## 2021-06-16 DIAGNOSIS — Z794 Long term (current) use of insulin: Secondary | ICD-10-CM | POA: Diagnosis not present

## 2021-06-16 DIAGNOSIS — E113393 Type 2 diabetes mellitus with moderate nonproliferative diabetic retinopathy without macular edema, bilateral: Secondary | ICD-10-CM | POA: Diagnosis not present

## 2021-06-16 DIAGNOSIS — E891 Postprocedural hypoinsulinemia: Secondary | ICD-10-CM | POA: Diagnosis not present

## 2021-06-16 MED ORDER — INSULIN DEGLUDEC FLEXTOUCH 100 UNIT/ML ~~LOC~~ SOPN
PEN_INJECTOR | SUBCUTANEOUS | 1 refills | Status: DC
  Filled 2021-06-16: qty 45, 75d supply, fill #0

## 2021-06-16 MED ORDER — PENTIPS 31G X 5 MM MISC
Freq: Four times a day (QID) | 3 refills | Status: DC
  Filled 2021-06-16: qty 300, 75d supply, fill #0

## 2021-06-16 MED ORDER — INSULIN ASPART FLEXPEN 100 UNIT/ML ~~LOC~~ SOPN
PEN_INJECTOR | SUBCUTANEOUS | 1 refills | Status: DC
  Filled 2021-06-16: qty 90, 90d supply, fill #0

## 2021-06-16 MED ORDER — TRULICITY 1.5 MG/0.5ML ~~LOC~~ SOAJ
SUBCUTANEOUS | 1 refills | Status: DC
  Filled 2021-06-16: qty 2, 28d supply, fill #0
  Filled 2021-07-19: qty 2, 28d supply, fill #1
  Filled 2021-08-15: qty 2, 28d supply, fill #2

## 2021-06-22 ENCOUNTER — Other Ambulatory Visit (HOSPITAL_BASED_OUTPATIENT_CLINIC_OR_DEPARTMENT_OTHER): Payer: Self-pay

## 2021-06-28 ENCOUNTER — Other Ambulatory Visit (HOSPITAL_BASED_OUTPATIENT_CLINIC_OR_DEPARTMENT_OTHER): Payer: Self-pay

## 2021-06-29 DIAGNOSIS — I10 Essential (primary) hypertension: Secondary | ICD-10-CM | POA: Diagnosis not present

## 2021-06-29 DIAGNOSIS — E119 Type 2 diabetes mellitus without complications: Secondary | ICD-10-CM | POA: Diagnosis not present

## 2021-06-29 DIAGNOSIS — E1165 Type 2 diabetes mellitus with hyperglycemia: Secondary | ICD-10-CM | POA: Diagnosis not present

## 2021-07-04 DIAGNOSIS — I10 Essential (primary) hypertension: Secondary | ICD-10-CM | POA: Diagnosis not present

## 2021-07-04 DIAGNOSIS — E7800 Pure hypercholesterolemia, unspecified: Secondary | ICD-10-CM | POA: Diagnosis not present

## 2021-07-04 DIAGNOSIS — E1165 Type 2 diabetes mellitus with hyperglycemia: Secondary | ICD-10-CM | POA: Diagnosis not present

## 2021-07-06 DIAGNOSIS — N401 Enlarged prostate with lower urinary tract symptoms: Secondary | ICD-10-CM | POA: Diagnosis not present

## 2021-07-06 DIAGNOSIS — N323 Diverticulum of bladder: Secondary | ICD-10-CM | POA: Diagnosis not present

## 2021-07-06 DIAGNOSIS — R351 Nocturia: Secondary | ICD-10-CM | POA: Diagnosis not present

## 2021-07-06 DIAGNOSIS — N3020 Other chronic cystitis without hematuria: Secondary | ICD-10-CM | POA: Diagnosis not present

## 2021-07-06 DIAGNOSIS — R310 Gross hematuria: Secondary | ICD-10-CM | POA: Diagnosis not present

## 2021-07-08 ENCOUNTER — Other Ambulatory Visit (HOSPITAL_BASED_OUTPATIENT_CLINIC_OR_DEPARTMENT_OTHER): Payer: Self-pay

## 2021-07-08 DIAGNOSIS — L570 Actinic keratosis: Secondary | ICD-10-CM | POA: Diagnosis not present

## 2021-07-08 DIAGNOSIS — E7800 Pure hypercholesterolemia, unspecified: Secondary | ICD-10-CM | POA: Diagnosis not present

## 2021-07-08 DIAGNOSIS — I10 Essential (primary) hypertension: Secondary | ICD-10-CM | POA: Diagnosis not present

## 2021-07-08 DIAGNOSIS — Z6836 Body mass index (BMI) 36.0-36.9, adult: Secondary | ICD-10-CM | POA: Diagnosis not present

## 2021-07-08 DIAGNOSIS — K219 Gastro-esophageal reflux disease without esophagitis: Secondary | ICD-10-CM | POA: Diagnosis not present

## 2021-07-08 DIAGNOSIS — R29898 Other symptoms and signs involving the musculoskeletal system: Secondary | ICD-10-CM | POA: Diagnosis not present

## 2021-07-08 DIAGNOSIS — B370 Candidal stomatitis: Secondary | ICD-10-CM | POA: Diagnosis not present

## 2021-07-08 DIAGNOSIS — F339 Major depressive disorder, recurrent, unspecified: Secondary | ICD-10-CM | POA: Diagnosis not present

## 2021-07-08 DIAGNOSIS — R202 Paresthesia of skin: Secondary | ICD-10-CM | POA: Diagnosis not present

## 2021-07-08 DIAGNOSIS — L723 Sebaceous cyst: Secondary | ICD-10-CM | POA: Diagnosis not present

## 2021-07-08 DIAGNOSIS — Z0000 Encounter for general adult medical examination without abnormal findings: Secondary | ICD-10-CM | POA: Diagnosis not present

## 2021-07-08 MED ORDER — NYSTATIN 100000 UNIT/ML MT SUSP
OROMUCOSAL | 3 refills | Status: AC
  Filled 2021-07-08: qty 240, 12d supply, fill #0

## 2021-07-08 MED ORDER — CARVEDILOL 25 MG PO TABS
ORAL_TABLET | ORAL | 4 refills | Status: DC
  Filled 2021-09-13: qty 90, 90d supply, fill #0
  Filled 2021-12-14: qty 90, 90d supply, fill #1
  Filled 2022-03-13: qty 90, 90d supply, fill #2
  Filled 2022-06-11: qty 90, 90d supply, fill #3

## 2021-07-08 MED ORDER — LANSOPRAZOLE 30 MG PO CPDR
DELAYED_RELEASE_CAPSULE | ORAL | 4 refills | Status: DC
  Filled 2021-09-24: qty 90, 90d supply, fill #0
  Filled 2021-12-26: qty 90, 90d supply, fill #1

## 2021-07-08 MED ORDER — PRAVASTATIN SODIUM 20 MG PO TABS
ORAL_TABLET | ORAL | 4 refills | Status: DC
  Filled 2021-09-18: qty 24, 84d supply, fill #0
  Filled 2021-12-18: qty 24, 84d supply, fill #1

## 2021-07-08 MED ORDER — LOSARTAN POTASSIUM 50 MG PO TABS
ORAL_TABLET | ORAL | 4 refills | Status: DC
  Filled 2021-09-18: qty 90, 90d supply, fill #0
  Filled 2021-12-14: qty 90, 90d supply, fill #1
  Filled 2022-03-13: qty 90, 90d supply, fill #2
  Filled 2022-06-11: qty 90, 90d supply, fill #3

## 2021-07-11 ENCOUNTER — Other Ambulatory Visit (HOSPITAL_BASED_OUTPATIENT_CLINIC_OR_DEPARTMENT_OTHER): Payer: Self-pay

## 2021-07-11 MED ORDER — CEPHALEXIN 500 MG PO CAPS
ORAL_CAPSULE | ORAL | 0 refills | Status: DC
  Filled 2021-07-11: qty 10, 5d supply, fill #0

## 2021-07-15 DIAGNOSIS — R29898 Other symptoms and signs involving the musculoskeletal system: Secondary | ICD-10-CM | POA: Diagnosis not present

## 2021-07-19 ENCOUNTER — Other Ambulatory Visit (HOSPITAL_BASED_OUTPATIENT_CLINIC_OR_DEPARTMENT_OTHER): Payer: Self-pay

## 2021-07-27 ENCOUNTER — Other Ambulatory Visit (HOSPITAL_BASED_OUTPATIENT_CLINIC_OR_DEPARTMENT_OTHER): Payer: Self-pay

## 2021-07-29 DIAGNOSIS — G8929 Other chronic pain: Secondary | ICD-10-CM | POA: Diagnosis not present

## 2021-07-29 DIAGNOSIS — M549 Dorsalgia, unspecified: Secondary | ICD-10-CM | POA: Diagnosis not present

## 2021-07-29 DIAGNOSIS — M79606 Pain in leg, unspecified: Secondary | ICD-10-CM | POA: Diagnosis not present

## 2021-07-29 DIAGNOSIS — E119 Type 2 diabetes mellitus without complications: Secondary | ICD-10-CM | POA: Diagnosis not present

## 2021-08-05 DIAGNOSIS — N3000 Acute cystitis without hematuria: Secondary | ICD-10-CM | POA: Diagnosis not present

## 2021-08-05 DIAGNOSIS — N323 Diverticulum of bladder: Secondary | ICD-10-CM | POA: Diagnosis not present

## 2021-08-08 ENCOUNTER — Other Ambulatory Visit (HOSPITAL_BASED_OUTPATIENT_CLINIC_OR_DEPARTMENT_OTHER): Payer: Self-pay

## 2021-08-09 ENCOUNTER — Other Ambulatory Visit (HOSPITAL_BASED_OUTPATIENT_CLINIC_OR_DEPARTMENT_OTHER): Payer: Self-pay

## 2021-08-09 DIAGNOSIS — M47816 Spondylosis without myelopathy or radiculopathy, lumbar region: Secondary | ICD-10-CM | POA: Diagnosis not present

## 2021-08-15 ENCOUNTER — Other Ambulatory Visit (HOSPITAL_BASED_OUTPATIENT_CLINIC_OR_DEPARTMENT_OTHER): Payer: Self-pay

## 2021-08-15 DIAGNOSIS — L723 Sebaceous cyst: Secondary | ICD-10-CM | POA: Diagnosis not present

## 2021-08-16 ENCOUNTER — Other Ambulatory Visit (HOSPITAL_BASED_OUTPATIENT_CLINIC_OR_DEPARTMENT_OTHER): Payer: Self-pay

## 2021-08-16 DIAGNOSIS — E113393 Type 2 diabetes mellitus with moderate nonproliferative diabetic retinopathy without macular edema, bilateral: Secondary | ICD-10-CM | POA: Diagnosis not present

## 2021-08-16 DIAGNOSIS — Z794 Long term (current) use of insulin: Secondary | ICD-10-CM | POA: Diagnosis not present

## 2021-08-16 DIAGNOSIS — I10 Essential (primary) hypertension: Secondary | ICD-10-CM | POA: Diagnosis not present

## 2021-08-16 DIAGNOSIS — E782 Mixed hyperlipidemia: Secondary | ICD-10-CM | POA: Diagnosis not present

## 2021-08-16 DIAGNOSIS — Z7985 Long-term (current) use of injectable non-insulin antidiabetic drugs: Secondary | ICD-10-CM | POA: Diagnosis not present

## 2021-08-16 MED ORDER — TRULICITY 3 MG/0.5ML ~~LOC~~ SOAJ
SUBCUTANEOUS | 1 refills | Status: DC
  Filled 2021-08-16: qty 6, 84d supply, fill #0
  Filled 2021-11-03: qty 6, 84d supply, fill #1

## 2021-08-16 MED ORDER — JARDIANCE 25 MG PO TABS
25.0000 mg | ORAL_TABLET | Freq: Every day | ORAL | 1 refills | Status: DC
  Filled 2021-08-16: qty 90, 90d supply, fill #0

## 2021-08-16 MED ORDER — INSULIN DEGLUDEC FLEXTOUCH 100 UNIT/ML ~~LOC~~ SOPN
PEN_INJECTOR | SUBCUTANEOUS | 1 refills | Status: DC
  Filled 2021-08-16: qty 54, 90d supply, fill #0

## 2021-08-17 ENCOUNTER — Other Ambulatory Visit (HOSPITAL_BASED_OUTPATIENT_CLINIC_OR_DEPARTMENT_OTHER): Payer: Self-pay

## 2021-08-22 ENCOUNTER — Other Ambulatory Visit (HOSPITAL_BASED_OUTPATIENT_CLINIC_OR_DEPARTMENT_OTHER): Payer: Self-pay

## 2021-08-22 MED ORDER — TETANUS-DIPHTH-ACELL PERTUSSIS 5-2.5-18.5 LF-MCG/0.5 IM SUSY
PREFILLED_SYRINGE | INTRAMUSCULAR | 0 refills | Status: DC
  Filled 2021-08-22: qty 0.5, 1d supply, fill #0

## 2021-08-22 MED ORDER — ZOSTER VAC RECOMB ADJUVANTED 50 MCG/0.5ML IM SUSR
INTRAMUSCULAR | 0 refills | Status: DC
  Filled 2021-08-22: qty 1, 1d supply, fill #0

## 2021-09-01 DIAGNOSIS — M47816 Spondylosis without myelopathy or radiculopathy, lumbar region: Secondary | ICD-10-CM | POA: Diagnosis not present

## 2021-09-02 ENCOUNTER — Other Ambulatory Visit: Payer: Self-pay

## 2021-09-02 ENCOUNTER — Emergency Department (HOSPITAL_BASED_OUTPATIENT_CLINIC_OR_DEPARTMENT_OTHER)
Admission: EM | Admit: 2021-09-02 | Discharge: 2021-09-02 | Disposition: A | Discharge status: Home / Self Care | Payer: HMO | Attending: Emergency Medicine | Admitting: Emergency Medicine

## 2021-09-02 ENCOUNTER — Encounter (HOSPITAL_BASED_OUTPATIENT_CLINIC_OR_DEPARTMENT_OTHER): Payer: Self-pay | Admitting: Emergency Medicine

## 2021-09-02 ENCOUNTER — Emergency Department (HOSPITAL_BASED_OUTPATIENT_CLINIC_OR_DEPARTMENT_OTHER): Payer: HMO

## 2021-09-02 ENCOUNTER — Other Ambulatory Visit (HOSPITAL_BASED_OUTPATIENT_CLINIC_OR_DEPARTMENT_OTHER): Payer: Self-pay

## 2021-09-02 DIAGNOSIS — I82401 Acute embolism and thrombosis of unspecified deep veins of right lower extremity: Secondary | ICD-10-CM | POA: Diagnosis not present

## 2021-09-02 DIAGNOSIS — Z79899 Other long term (current) drug therapy: Secondary | ICD-10-CM | POA: Insufficient documentation

## 2021-09-02 DIAGNOSIS — Z794 Long term (current) use of insulin: Secondary | ICD-10-CM | POA: Insufficient documentation

## 2021-09-02 DIAGNOSIS — I824Y1 Acute embolism and thrombosis of unspecified deep veins of right proximal lower extremity: Secondary | ICD-10-CM | POA: Diagnosis not present

## 2021-09-02 DIAGNOSIS — D6851 Activated protein C resistance: Secondary | ICD-10-CM | POA: Diagnosis not present

## 2021-09-02 DIAGNOSIS — I82531 Chronic embolism and thrombosis of right popliteal vein: Secondary | ICD-10-CM | POA: Diagnosis not present

## 2021-09-02 DIAGNOSIS — M79661 Pain in right lower leg: Secondary | ICD-10-CM | POA: Diagnosis present

## 2021-09-02 DIAGNOSIS — I82511 Chronic embolism and thrombosis of right femoral vein: Secondary | ICD-10-CM | POA: Diagnosis not present

## 2021-09-02 LAB — BASIC METABOLIC PANEL
Anion gap: 6 (ref 5–15)
BUN: 25 mg/dL — ABNORMAL HIGH (ref 8–23)
CO2: 23 mmol/L (ref 22–32)
Calcium: 10.6 mg/dL — ABNORMAL HIGH (ref 8.9–10.3)
Chloride: 107 mmol/L (ref 98–111)
Creatinine, Ser: 1.09 mg/dL (ref 0.61–1.24)
GFR, Estimated: >60 mL/min (ref >60)
Glucose, Bld: 158 mg/dL — ABNORMAL HIGH (ref 70–99)
Potassium: 4.7 mmol/L (ref 3.5–5.1)
Sodium: 136 mmol/L (ref 135–145)

## 2021-09-02 LAB — CBC WITH DIFFERENTIAL/PLATELET
Abs Immature Granulocytes: 0.03 10*3/uL (ref 0.00–0.07)
Basophils Absolute: 0.2 10*3/uL — ABNORMAL HIGH (ref 0.0–0.1)
Basophils Relative: 2 %
Eosinophils Absolute: 0.6 10*3/uL — ABNORMAL HIGH (ref 0.0–0.5)
Eosinophils Relative: 6 %
HCT: 36.5 % — ABNORMAL LOW (ref 39.0–52.0)
Hemoglobin: 12.5 g/dL — ABNORMAL LOW (ref 13.0–17.0)
Immature Granulocytes: 0 %
Lymphocytes Relative: 27 %
Lymphs Abs: 2.6 10*3/uL (ref 0.7–4.0)
MCH: 30.5 pg (ref 26.0–34.0)
MCHC: 34.2 g/dL (ref 30.0–36.0)
MCV: 89 fL (ref 80.0–100.0)
Monocytes Absolute: 1.4 10*3/uL — ABNORMAL HIGH (ref 0.1–1.0)
Monocytes Relative: 15 %
Neutro Abs: 4.9 10*3/uL (ref 1.7–7.7)
Neutrophils Relative %: 50 %
Platelets: 365 10*3/uL (ref 150–400)
RBC: 4.1 MIL/uL — ABNORMAL LOW (ref 4.22–5.81)
RDW: 13.4 % (ref 11.5–15.5)
WBC: 9.7 10*3/uL (ref 4.0–10.5)
nRBC: 0 % (ref 0.0–0.2)

## 2021-09-02 MED ORDER — APIXABAN (ELIQUIS) VTE STARTER PACK (10MG AND 5MG)
ORAL_TABLET | ORAL | 0 refills | Status: DC
  Filled 2021-09-02: qty 74, 30d supply, fill #0

## 2021-09-02 MED ORDER — APIXABAN 2.5 MG PO TABS: 5.0000 mg | ORAL_TABLET | Freq: Two times a day (BID) | ORAL | Status: DC

## 2021-09-02 MED ORDER — APIXABAN 2.5 MG PO TABS
10.0000 mg | ORAL_TABLET | Freq: Two times a day (BID) | ORAL | Status: DC
  Administered 2021-09-02: 10 mg via ORAL
  Filled 2021-09-02: qty 4

## 2021-09-02 NOTE — ED Triage Notes (Addendum)
Right lower leg pain with swelling since last night.  Pain and swelling have increased this am.  Pt has history of DVT and was taken off his Eliquis recently

## 2021-09-02 NOTE — Discharge Instructions (Addendum)
I would recommend following up with your primary care doctor, vascular surgery and your hematologist.  Call their numbers to get follow-up appointment.  If you are having increasing pain, swelling, any numbness or tingling in your leg fever, any difficulty breathing, chest pain, lightheadedness or passing out, come back to ER for reassessment.  Information on my medicine - ELIQUIS (apixaban)  This medication education was reviewed with me or my healthcare representative as part of my discharge preparation.   Why was Eliquis prescribed for you? Eliquis was prescribed to treat blood clots that may have been found in the veins of your legs (deep vein thrombosis) or in your lungs (pulmonary embolism) and to reduce the risk of them occurring again.  What do You need to know about Eliquis ? The starting dose is 10 mg (two 5 mg tablets) taken TWICE daily for the FIRST SEVEN (7) DAYS, then the dose is reduced to ONE 5 mg tablet taken TWICE daily.  Eliquis may be taken with or without food.   Try to take the dose about the same time in the morning and in the evening. If you have difficulty swallowing the tablet whole please discuss with your pharmacist how to take the medication safely.  Take Eliquis exactly as prescribed and DO NOT stop taking Eliquis without talking to the doctor who prescribed the medication.  Stopping may increase your risk of developing a new blood clot.  Refill your prescription before you run out.  After discharge, you should have regular check-up appointments with your healthcare provider that is prescribing your Eliquis.    What do you do if you miss a dose? If a dose of ELIQUIS is not taken at the scheduled time, take it as soon as possible on the same day and twice-daily administration should be resumed. The dose should not be doubled to make up for a missed dose.  Important Safety Information A possible side effect of Eliquis is bleeding. You should call your  healthcare provider right away if you experience any of the following: Bleeding from an injury or your nose that does not stop. Unusual colored urine (red or dark brown) or unusual colored stools (red or black). Unusual bruising for unknown reasons. A serious fall or if you hit your head (even if there is no bleeding).  Some medicines may interact with Eliquis and might increase your risk of bleeding or clotting while on Eliquis. To help avoid this, consult your healthcare provider or pharmacist prior to using any new prescription or non-prescription medications, including herbals, vitamins, non-steroidal anti-inflammatory drugs (NSAIDs) and supplements.  This website has more information on Eliquis (apixaban): http://www.eliquis.com/eliquis/home

## 2021-09-02 NOTE — ED Provider Notes (Signed)
[facility]ER[facility] DEPARTMENT Provider Note   CSN: [id number] Arrival date & time: 09/02/21  1050     History  Chief Complaint  Patient presents with   Leg Pain    Derek [name] is a 76 y.o. male.  Presenting to the emergency department due to concern for leg pain.  Patient reports yesterday evening he noted some increasing swelling and a little bit of pain, swelling and pain worse this morning.  He denies any sort of chest pain or difficulty in breathing, no episodes of lightheadedness or dizziness or passing out.  He reports that around May of this year he stopped taking his blood thinner as recommended by his primary care doctor.  Had been dealing with hematuria.  Has not had hematuria in a while.  Has not had any GI bleeding.  Had been seen by vascular surgery and hematology/oncology previously and they had recommended being on indefinite anticoagulation.  Reviewed note from Dr. 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  No surgical intervention for prior DVT per vascular..  Reviewed discharge summary from 11/30/2020.  Had an IVC filter placed at that admission.  Patient also has a history of creatinine cancer.  Factor V Leiden mutation.  HPI     Home Medications Prior to Admission medications   Medication Sig Start Date End Date Taking? Authorizing Provider  carvedilol (COREG) 25 MG tablet Take 1 tablet by mouth once a day every evening 07/08/21     cephALEXin (KEFLEX) 500 MG capsule Take 1 capsule by mouth every 12 hours 07/11/21     Continuous Blood Gluc Receiver (FREESTYLE LIBRE 2 READER) DEVI use as directed to check blood sugar 02/01/21     Continuous Blood Gluc Sensor (FREESTYLE LIBRE 2 SENSOR) MISC Use as directed 12/07/20     Dulaglutide (TRULICITY) 8.93 YB/0.1BP SOPN Inject into the skin once a week.    [provider]  Dulaglutide (TRULICITY) 1.5 ZW/2.5EN SOPN  Inject 0.5 mLs (1.5 mg total) into the skin every 7 days. 06/16/21     Dulaglutide (TRULICITY) 3 ID/7.8EU SOPN Inject 0.5 mLs (3 mg total) into the skin every 7 days. 08/16/21     empagliflozin (JARDIANCE) 25 MG TABS tablet Take 1 tablet (25 mg total) by mouth daily. 08/16/21     furosemide (LASIX) 20 MG tablet Take 20 mg by mouth daily as needed for edema.    [provider]  insulin aspart (NOVOLOG) 100 UNIT/ML injection USE 100U/DAY SUBCUTANEOUS VIA PUMP 30 DAY(S) Patient taking differently: Inject 5-30 Units into the skin See admin instructions. Inject 5-30 units into the skin three times a day before meals, PER SLIDING SCALE 12/23/19 05/23/21  Jacelyn Pi, MD  Insulin Aspart FlexPen (NOVOLOG) 100 UNIT/ML Use as directed. Max daily dose of 100 units. 06/16/21     Insulin Degludec FlexTouch 100 UNIT/ML SOPN Inject 40 Units into the skin daily. 06/16/21     Insulin Degludec FlexTouch 100 UNIT/ML SOPN Inject 60 Units into the skin daily. 08/16/21     Insulin Pen Needle (PENTIPS) 31G X 5 MM MISC Use 4 times daily as directed 06/16/21     Insulin Syringe-Needle U-100 31G X 5/16" 1 ML MISC Use as directed to inject insulin. 01/31/21     Iron-FA-B Cmp-C-Biot-Probiotic (FUSION PLUS) CAPS Take 1 capsule by mouth daily. 03/02/21   Celso Amy, NP  lansoprazole (PREVACID) 30 MG capsule TAKE 1 CAPSULE BY MOUTH ONCE DAILY  07/08/21     lidocaine (LIDODERM) 5 % Place 1 patch onto the skin daily. Remove & Discard patch within 12 hours or as directed by MD 12/01/20   Cristal Deer, MD  losartan (COZAAR) 50 MG tablet Take 1 tablet by mouth once every evening 07/08/21     magnesium oxide (MAG-OX) 400 MG tablet Take 800 mg by mouth at bedtime. Triple complex    [provider]  megestrol (MEGACE) 40 MG tablet Take one tablet by mouth twice daily. Patient taking differently: Take 40 mg by mouth at bedtime. 04/13/21     nystatin (MYCOSTATIN) 100000 UNIT/ML suspension Use 4-5 mL for Mouth/Throat as  directed four times a day 07/08/21     oxybutynin (DITROPAN) 5 MG tablet Take 1 tablet (5 mg total) by mouth every 6 (six) to 8 (eight) hours as needed for urinary urgency/frequency/leakage. 05/26/21     pravastatin (PRAVACHOL) 20 MG tablet Take 1 tablet by mouth twice a week 07/08/21     pregabalin (LYRICA) 150 MG capsule Take 1 capsule by mouth Twice a day 04/19/21     pregabalin (LYRICA) 200 MG capsule Take 1 capsule by mouth 2 times daily 05/30/21     QUEtiapine (SEROQUEL) 25 MG tablet Take 1 tablet by mouth once a day at bedtime 01/26/21     tamsulosin (FLOMAX) 0.4 MG CAPS capsule Take 1 capsule by mouth at bedtime 05/23/21     Tdap (BOOSTRIX) 5-2.5-18.5 LF-MCG/0.5 injection Inject into the muscle. 08/22/21   Carlyle Basques, MD  triamcinolone cream (KENALOG) 0.1 % Apply externally two times a day for 30 days. 10/07/20     UNABLE TO FIND Apply 1 Tube topically as needed. Med Name: [facility]    [provider]  Zoster Vaccine Adjuvanted [facility]) injection Inject into the muscle. 08/22/21   Carlyle Basques, MD      Allergies    Diclofenac, Doxycycline, Doxycycline hyclate, Duloxetine, Hydrocodone, Oxybutynin, Quinine, Duloxetine hcl, Gabapentin, Morphine and related, Oxycontin [oxycodone hcl], Sertraline, Sertraline hcl, Tape, and Voltaren [diclofenac sodium]    Review of Systems   Review of Systems  Physical Exam Updated Vital Signs BP 104/82 (BP Location: Left Arm)   Pulse 73   Temp 97.9 F (36.6 C) (Oral)   Resp 20   Ht 6' (1.829 m)   Wt 116.8 kg   SpO2 98%   BMI 34.92 kg/m  Physical Exam Vitals and nursing note reviewed.  Constitutional:      General: He is not in acute distress.    Appearance: He is well-developed.  HENT:     Head: Normocephalic and atraumatic.  Eyes:     Conjunctiva/sclera: Conjunctivae normal.  Cardiovascular:     Rate and Rhythm: Normal rate and regular rhythm.     Heart sounds: No murmur heard. Pulmonary:     Effort: Pulmonary effort  is normal. No respiratory distress.     Breath sounds: Normal breath sounds.  Abdominal:     Palpations: Abdomen is soft.     Tenderness: There is no abdominal tenderness.  Musculoskeletal:        General: Swelling present.     Cervical back: Neck supple.     Comments: RLE: His right leg appears modestly swollen, worse in calf, he has normal DP and PT pulses, sensation and motor is intact LLE: no TTP or swelling throughout  Skin:    General: Skin is warm and dry.     Capillary Refill: Capillary refill takes less than 2  seconds.  Neurological:     Mental Status: He is alert.  Psychiatric:        Mood and Affect: Mood normal.     ED Results / Procedures / Treatments   Labs (all labs ordered are listed, but only abnormal results are displayed) Labs Reviewed  CBC WITH DIFFERENTIAL/PLATELET - Abnormal; Notable for the following components:      Result Value   RBC 4.10 (*)    Hemoglobin 12.5 (*)    HCT 36.5 (*)    Monocytes Absolute 1.4 (*)    Eosinophils Absolute 0.6 (*)    Basophils Absolute 0.2 (*)    All other components within normal limits  BASIC METABOLIC PANEL - Abnormal; Notable for the following components:   Glucose, Bld 158 (*)    BUN 25 (*)    Calcium 10.6 (*)    All other components within normal limits    EKG None  Radiology US Venous Img Lower Unilateral Right  Result Date: 09/02/2021 CLINICAL DATA:  pain and leg swelling, hx of DVT EXAM: RIGHT LOWER EXTREMITY VENOUS DOPPLER ULTRASOUND TECHNIQUE: Gray-scale sonography with graded compression, as well as color Doppler and duplex ultrasound were performed to evaluate the lower extremity deep venous systems from the level of the common femoral vein and including the common femoral, femoral, profunda femoral, popliteal and calf veins including the posterior tibial, peroneal and gastrocnemius veins when visible. The superficial great saphenous vein was also interrogated. Spectral Doppler was utilized to evaluate  flow at rest and with distal augmentation maneuvers in the common femoral, femoral and popliteal veins. COMPARISON:  Ultrasound December 10, 2020. FINDINGS: Contralateral Common Femoral Vein: Respiratory phasicity is normal and symmetric with the symptomatic side. No evidence of thrombus. Normal compressibility. Common Femoral Vein: Visible nonocclusive thrombus. Saphenofemoral Junction: No evidence of thrombus. Normal compressibility and flow on color Doppler imaging. Profunda Femoral Vein: Visible thrombus with limited compressibility. Femoral Vein: Visible thrombus with limited compressibility. Popliteal Vein: Visible thrombus with limited compressibility. Calf Veins: Limited visualization without definite evidence of thrombosis. IMPRESSION: Positive for DVT within the right common femoral, profunda femoral, femoral, and popliteal veins. Electronically Signed   By: Margaretha Sheffield M.D.   On: 09/02/2021 12:05    Procedures Procedures    Medications Ordered in ED Medications  apixaban (ELIQUIS) tablet 10 mg (has no administration in time range)    Followed by  apixaban (ELIQUIS) tablet 5 mg (has no administration in time range)    ED Course/ Medical Decision Making/ A&P                           Medical Decision Making Amount and/or Complexity of Data Reviewed Labs: ordered.  Risk Prescription drug management.   76 year old gentleman presenting to ER due to concern for right leg pain/swelling.  Has history of factor V Leiden, prior DVT, not on anticoagulation at present.  His ultrasound today is concerning for acute DVT.  Involves common femoral vein, profunda femoral, popliteal veins.  Notably the common femoral vein clot is not occlusive.  Check basic labs, no anemia, no thrombocytopenia.  He denies any active bleeding right now and states that his been multiple months since he was having issues with hematuria.  I feel patient would strongly benefit from restarting anticoagulation at this  time.  I discussed risk and benefits with patient and wife and they are agreeable.  I have completed extensive chart review including review of heme-onc notes,  vascular surgery notes, discharge summary from hospitalization last October.  We will give dose of his Eliquis while in ER.  We will give Rx for Eliquis starter pack.  I have sent message to Dr. [name] and his NP Lottie Dawson to request f/u. I also encouraged pt f/u with his PCP and with vascular given his history.   He has no cardiorespiratory complaint and has normal vital signs.  Feel he is appropriate for outpatient management at this time and does not require admission.  After the discussed management above, the patient was determined to be safe for discharge.  The patient was in agreement with this plan and all questions regarding their care were answered.  ED return precautions were discussed and the patient will return to the ED with any significant worsening of condition.         Final Clinical Impression(s) / ED Diagnoses Final diagnoses:  Acute deep vein thrombosis (DVT) of proximal vein of right lower extremity ([employer])  Factor V Leiden ([place])    Rx / DC Orders ED Discharge Orders          Ordered    Ambulatory referral to Hematology / Oncology       Comments: Recurrent DVT, pt has seen Ennever and Royce Macadamia previously at [facility]; please route recommendations from referral to his PCP   09/02/21 1233              Lucrezia Starch, MD 09/02/21 1337

## 2021-09-02 NOTE — ED Notes (Signed)
Patient given discharge instructions, all questions answered. Patient in possession of all belongings, directed to the discharge area  

## 2021-09-05 ENCOUNTER — Other Ambulatory Visit (HOSPITAL_BASED_OUTPATIENT_CLINIC_OR_DEPARTMENT_OTHER): Payer: Self-pay

## 2021-09-12 ENCOUNTER — Encounter (HOSPITAL_BASED_OUTPATIENT_CLINIC_OR_DEPARTMENT_OTHER): Payer: Self-pay | Admitting: Emergency Medicine

## 2021-09-12 ENCOUNTER — Emergency Department (HOSPITAL_BASED_OUTPATIENT_CLINIC_OR_DEPARTMENT_OTHER): Payer: HMO

## 2021-09-12 ENCOUNTER — Other Ambulatory Visit: Payer: Self-pay

## 2021-09-12 ENCOUNTER — Emergency Department (HOSPITAL_BASED_OUTPATIENT_CLINIC_OR_DEPARTMENT_OTHER)
Admission: EM | Admit: 2021-09-12 | Discharge: 2021-09-13 | Disposition: A | Discharge status: Home / Self Care | Payer: HMO | Attending: Emergency Medicine | Admitting: Emergency Medicine

## 2021-09-12 DIAGNOSIS — Z794 Long term (current) use of insulin: Secondary | ICD-10-CM | POA: Insufficient documentation

## 2021-09-12 DIAGNOSIS — I82411 Acute embolism and thrombosis of right femoral vein: Secondary | ICD-10-CM | POA: Diagnosis not present

## 2021-09-12 DIAGNOSIS — M79604 Pain in right leg: Secondary | ICD-10-CM | POA: Diagnosis present

## 2021-09-12 DIAGNOSIS — I82511 Chronic embolism and thrombosis of right femoral vein: Secondary | ICD-10-CM | POA: Diagnosis not present

## 2021-09-12 DIAGNOSIS — Z79899 Other long term (current) drug therapy: Secondary | ICD-10-CM | POA: Diagnosis not present

## 2021-09-12 DIAGNOSIS — I82531 Chronic embolism and thrombosis of right popliteal vein: Secondary | ICD-10-CM | POA: Diagnosis not present

## 2021-09-12 MED ORDER — FENTANYL CITRATE PF 50 MCG/ML IJ SOSY
50.0000 ug | PREFILLED_SYRINGE | Freq: Once | INTRAMUSCULAR | Status: AC
  Administered 2021-09-12: 50 ug via INTRAMUSCULAR

## 2021-09-12 MED ORDER — FENTANYL CITRATE PF 50 MCG/ML IJ SOSY
PREFILLED_SYRINGE | INTRAMUSCULAR | Status: AC
  Filled 2021-09-12: qty 1

## 2021-09-12 MED ORDER — FENTANYL 25 MCG/HR TD PT72
1.0000 | MEDICATED_PATCH | TRANSDERMAL | 0 refills | Status: DC
  Filled 2021-09-12: qty 1, 5d supply, fill #0
  Filled 2021-09-15: qty 4, 12d supply, fill #1

## 2021-09-12 NOTE — ED Triage Notes (Signed)
Pt reports being dx with two blood clots in right leg on 7/14.  Was placed on Eliquis.  States pain has worsened and no improvement in right leg swelling.  Denies chest pain, new shob, cough.

## 2021-09-12 NOTE — ED Provider Notes (Signed)
[facility]MENT Provider Note   CSN: [id number] Arrival date & time: 09/12/21  1945     History  Chief Complaint  Patient presents with   Leg Pain    Derek [name] is a 76 y.o. male.  Pt is a 76 yo male with pmh of factor V leiden def, IVC filter, at the time taken off eliquis, diagnosed with acute DVT in right common femoral, profunda femoral, and popliteal veins on July 14th 2023 and restarted on eliquis presenting for worsening right lower leg pain x 4 days. Denies changes in swelling.   The history is provided by the patient. No language interpreter was used.  Leg Pain Associated symptoms: no back pain and no fever        Home Medications Prior to Admission medications   Medication Sig Start Date End Date Taking? Authorizing Provider  APIXABAN (ELIQUIS) VTE STARTER PACK ('10MG'$  AND '5MG'$ ) Take as directed on package: start with two-'5mg'$  tablets twice daily for 7 days. On day 8, switch to one-'5mg'$  tablet twice daily. 09/02/21   Lucrezia Starch, MD  carvedilol (COREG) 25 MG tablet Take 1 tablet by mouth once a day every evening 07/08/21     cephALEXin (KEFLEX) 500 MG capsule Take 1 capsule by mouth every 12 hours 07/11/21     Continuous Blood Gluc Receiver (FREESTYLE LIBRE 2 READER) DEVI use as directed to check blood sugar 02/01/21     Continuous Blood Gluc Sensor (FREESTYLE LIBRE 2 SENSOR) MISC Use as directed 12/07/20     Dulaglutide (TRULICITY) 9.03 ES/9.2ZR SOPN Inject into the skin once a week.    [provider]  Dulaglutide (TRULICITY) 1.5 AQ/7.6AU SOPN Inject 0.5 mLs (1.5 mg total) into the skin every 7 days. 06/16/21     Dulaglutide (TRULICITY) 3 QJ/3.3LK SOPN Inject 0.5 mLs (3 mg total) into the skin every 7 days. 08/16/21     empagliflozin (JARDIANCE) 25 MG TABS tablet Take 1 tablet (25 mg total) by mouth daily. 08/16/21     furosemide (LASIX) 20 MG tablet Take 20 mg by mouth daily as needed for edema.    [provider]   insulin aspart (NOVOLOG) 100 UNIT/ML injection USE 100U/DAY SUBCUTANEOUS VIA PUMP 30 DAY(S) Patient taking differently: Inject 5-30 Units into the skin See admin instructions. Inject 5-30 units into the skin three times a day before meals, PER SLIDING SCALE 12/23/19 05/23/21  Jacelyn Pi, MD  Insulin Aspart FlexPen (NOVOLOG) 100 UNIT/ML Use as directed. Max daily dose of 100 units. 06/16/21     Insulin Degludec FlexTouch 100 UNIT/ML SOPN Inject 40 Units into the skin daily. 06/16/21     Insulin Degludec FlexTouch 100 UNIT/ML SOPN Inject 60 Units into the skin daily. 08/16/21     Insulin Pen Needle (PENTIPS) 31G X 5 MM MISC Use 4 times daily as directed 06/16/21     Insulin Syringe-Needle U-100 31G X 5/16" 1 ML MISC Use as directed to inject insulin. 01/31/21     Iron-FA-B Cmp-C-Biot-Probiotic (FUSION PLUS) CAPS Take 1 capsule by mouth daily. 03/02/21   Celso Amy, NP  lansoprazole (PREVACID) 30 MG capsule TAKE 1 CAPSULE BY MOUTH ONCE DAILY 07/08/21     lidocaine (LIDODERM) 5 % Place 1 patch onto the skin daily. Remove & Discard patch within 12 hours or as directed by MD 12/01/20   Cristal Deer, MD  losartan (COZAAR) 50 MG tablet Take 1 tablet by mouth once every evening 07/08/21  magnesium oxide (MAG-OX) 400 MG tablet Take 800 mg by mouth at bedtime. Triple complex    [provider]  megestrol (MEGACE) 40 MG tablet Take one tablet by mouth twice daily. Patient taking differently: Take 40 mg by mouth at bedtime. 04/13/21     nystatin (MYCOSTATIN) 100000 UNIT/ML suspension Use 4-5 mL for Mouth/Throat as directed four times a day 07/08/21     oxybutynin (DITROPAN) 5 MG tablet Take 1 tablet (5 mg total) by mouth every 6 (six) to 8 (eight) hours as needed for urinary urgency/frequency/leakage. 05/26/21     pravastatin (PRAVACHOL) 20 MG tablet Take 1 tablet by mouth twice a week 07/08/21     pregabalin (LYRICA) 150 MG capsule Take 1 capsule by mouth Twice a day 04/19/21     pregabalin  (LYRICA) 200 MG capsule Take 1 capsule by mouth 2 times daily 05/30/21     QUEtiapine (SEROQUEL) 25 MG tablet Take 1 tablet by mouth once a day at bedtime 01/26/21     tamsulosin (FLOMAX) 0.4 MG CAPS capsule Take 1 capsule by mouth at bedtime 05/23/21     Tdap (BOOSTRIX) 5-2.5-18.5 LF-MCG/0.5 injection Inject into the muscle. 08/22/21   Carlyle Basques, MD  triamcinolone cream (KENALOG) 0.1 % Apply externally two times a day for 30 days. 10/07/20     UNABLE TO FIND Apply 1 Tube topically as needed. Med Name: [facility]    [provider]  Zoster Vaccine Adjuvanted [facility]) injection Inject into the muscle. 08/22/21   Carlyle Basques, MD      Allergies    Diclofenac, Doxycycline, Doxycycline hyclate, Duloxetine, Hydrocodone, Oxybutynin, Quinine, Duloxetine hcl, Gabapentin, Morphine and related, Oxycontin [oxycodone hcl], Sertraline, Sertraline hcl, Tape, and Voltaren [diclofenac sodium]    Review of Systems   Review of Systems  Constitutional:  Negative for chills and fever.  HENT:  Negative for ear pain and sore throat.   Eyes:  Negative for pain and visual disturbance.  Respiratory:  Negative for cough and shortness of breath.   Cardiovascular:  Positive for leg swelling. Negative for chest pain and palpitations.  Gastrointestinal:  Negative for abdominal pain and vomiting.  Genitourinary:  Negative for dysuria and hematuria.  Musculoskeletal:  Negative for arthralgias and back pain.  Skin:  Negative for color change and rash.  Neurological:  Negative for seizures and syncope.  All other systems reviewed and are negative.   Physical Exam Updated Vital Signs BP 137/85 (BP Location: Right Arm)   Pulse (!) 113   Temp 98.7 F (37.1 C) (Oral)   Resp 20   Ht 6' (1.829 m)   Wt 117.9 kg   SpO2 96%   BMI 35.26 kg/m  Physical Exam Vitals and nursing note reviewed.  Constitutional:      General: He is not in acute distress.    Appearance: He is well-developed.  HENT:      Head: Normocephalic and atraumatic.  Eyes:     Conjunctiva/sclera: Conjunctivae normal.  Cardiovascular:     Rate and Rhythm: Normal rate and regular rhythm.     Pulses:          Dorsalis pedis pulses are 2+ on the right side and 2+ on the left side.     Heart sounds: No murmur heard. Pulmonary:     Effort: Pulmonary effort is normal. No respiratory distress.     Breath sounds: Normal breath sounds.  Abdominal:     Palpations: Abdomen is soft.     Tenderness:  There is no abdominal tenderness.  Musculoskeletal:        General: No swelling.     Cervical back: Neck supple.     Right lower leg: 3+ Edema present.  Skin:    General: Skin is warm and dry.     Capillary Refill: Capillary refill takes less than 2 seconds.  Neurological:     Mental Status: He is alert.  Psychiatric:        Mood and Affect: Mood normal.     ED Results / Procedures / Treatments   Labs (all labs ordered are listed, but only abnormal results are displayed) Labs Reviewed - No data to display  EKG None  Radiology US Venous Img Lower Unilateral Right  Result Date: 09/12/2021 CLINICAL DATA:  Known deep venous thrombosis in right leg without improvement after blood thinners. EXAM: RIGHT LOWER EXTREMITY VENOUS DOPPLER ULTRASOUND TECHNIQUE: Devorah Givhan-scale sonography with compression, as well as color and duplex ultrasound, were performed to evaluate the deep venous system(s) from the level of the common femoral vein through the popliteal and proximal calf veins. COMPARISON:  09/02/2021 FINDINGS: VENOUS Nonocclusive thrombus within the right common femoral vein. Occlusive thrombus within the right femoral vein. Nonocclusive thrombus within the right popliteal vein. Normal findings within the profunda femoral vein and saphenofemoral junction. Calf veins not visualized. Limited views of the contralateral common femoral vein are unremarkable. OTHER None. Limitations: none IMPRESSION: Thrombus within the right common  femoral, femoral, popliteal veins as detailed above. Electronically Signed   By: Abigail Miyamoto M.D.   On: 09/12/2021 21:25    Procedures Procedures    Medications Ordered in ED Medications  fentaNYL (SUBLIMAZE) injection 50 mcg (has no administration in time range)    ED Course/ Medical Decision Making/ A&P                           Medical Decision Making Risk Prescription drug management.   11:02 PM 76 yo male with pmh of factor V leiden def, IVC filter, at the time taken off eliquis, diagnosed with acute DVT in right common femoral, profunda femoral, and popliteal veins on July 14th 2023 and restarted on eliquis presenting for worsening right lower leg pain x 4 days.   On physical exam patient has +2 pitting edema.  Palpable dorsalis pedis pulses.  No erythema.  Repeat ultrasound demonstrates continued DVT with no significant changes.  No occlusion.  I spoke with on-call vascular surgeon who has no further recommendations at this time besides taking prescribed Eliquis.  Patient has a history of narcotic overuse and an overdose attempt that was just last year.  Concerns with sending patient home with pain pills.  Patient's wife is at the bedside who is now in control of his medications.  Prescription for fentanyl patch sent to pharmacy.  Patient's wife states she is now in control of all of his medications and will dose fentanyl appropriately and not let the patient be in charge of his patches.  Vascular surgery also recommended elevation and compression.  Compression stockings prescribed.  Ace wrap given today.  Has follow-up appointment with hematology already.  Patient in no distress and overall condition improved here in the ED. Detailed discussions were had with the patient regarding current findings, and need for close f/u with PCP or on call doctor. The patient has been instructed to return immediately if the symptoms worsen in any way for re-evaluation. Patient verbalized  understanding and is  in agreement with current care plan. All questions answered prior to discharge.         Final Clinical Impression(s) / ED Diagnoses Final diagnoses:  Acute deep vein thrombosis (DVT) of femoral vein of right lower extremity [facility])    Rx / DC Orders ED Discharge Orders     None         Lianne Cure, DO 84/78/41 2302

## 2021-09-12 NOTE — Discharge Instructions (Addendum)
I have changed the fentanyl prescription to be a 72-hour patch. This means one patch every 72 hours. Remove the old patch before placing a new one.   Follow up with for your hematology appointment. Keep leg elevated and compressed.   Continue taking Eliquis

## 2021-09-12 NOTE — ED Notes (Signed)
Pt. Is tearful about the pain in his R lower Leg due to an on going clot.  Pt. Has noted edema in the R lower calf and ankle with a positive pedal pulse and no redness noted.

## 2021-09-13 ENCOUNTER — Other Ambulatory Visit (HOSPITAL_BASED_OUTPATIENT_CLINIC_OR_DEPARTMENT_OTHER): Payer: Self-pay

## 2021-09-15 ENCOUNTER — Other Ambulatory Visit (HOSPITAL_BASED_OUTPATIENT_CLINIC_OR_DEPARTMENT_OTHER): Payer: Self-pay

## 2021-09-18 ENCOUNTER — Other Ambulatory Visit (HOSPITAL_BASED_OUTPATIENT_CLINIC_OR_DEPARTMENT_OTHER): Payer: Self-pay

## 2021-09-19 ENCOUNTER — Other Ambulatory Visit (HOSPITAL_BASED_OUTPATIENT_CLINIC_OR_DEPARTMENT_OTHER): Payer: Self-pay

## 2021-09-19 ENCOUNTER — Inpatient Hospital Stay: Payer: PPO | Admitting: Hematology & Oncology

## 2021-09-19 ENCOUNTER — Inpatient Hospital Stay: Payer: PPO

## 2021-09-19 MED ORDER — TAMSULOSIN HCL 0.4 MG PO CAPS
ORAL_CAPSULE | ORAL | 3 refills | Status: DC
  Filled 2021-09-19: qty 30, 30d supply, fill #0
  Filled 2021-10-18: qty 30, 30d supply, fill #1
  Filled 2021-11-22: qty 30, 30d supply, fill #2
  Filled 2021-12-18: qty 30, 30d supply, fill #3

## 2021-09-20 ENCOUNTER — Other Ambulatory Visit: Payer: Self-pay

## 2021-09-20 DIAGNOSIS — I82401 Acute embolism and thrombosis of unspecified deep veins of right lower extremity: Secondary | ICD-10-CM

## 2021-09-20 DIAGNOSIS — D649 Anemia, unspecified: Secondary | ICD-10-CM

## 2021-09-21 ENCOUNTER — Inpatient Hospital Stay: Payer: HMO | Attending: Hematology & Oncology

## 2021-09-21 ENCOUNTER — Encounter: Payer: Self-pay | Admitting: Hematology & Oncology

## 2021-09-21 ENCOUNTER — Inpatient Hospital Stay (HOSPITAL_BASED_OUTPATIENT_CLINIC_OR_DEPARTMENT_OTHER): Payer: HMO | Admitting: Hematology & Oncology

## 2021-09-21 ENCOUNTER — Telehealth: Payer: Self-pay | Admitting: *Deleted

## 2021-09-21 ENCOUNTER — Other Ambulatory Visit: Payer: Self-pay

## 2021-09-21 VITALS — BP 124/68 | HR 75 | Temp 98.4°F | Resp 20 | Wt 262.0 lb

## 2021-09-21 DIAGNOSIS — Z7901 Long term (current) use of anticoagulants: Secondary | ICD-10-CM | POA: Diagnosis not present

## 2021-09-21 DIAGNOSIS — R319 Hematuria, unspecified: Secondary | ICD-10-CM | POA: Diagnosis not present

## 2021-09-21 DIAGNOSIS — Z86718 Personal history of other venous thrombosis and embolism: Secondary | ICD-10-CM | POA: Insufficient documentation

## 2021-09-21 DIAGNOSIS — M7989 Other specified soft tissue disorders: Secondary | ICD-10-CM | POA: Diagnosis not present

## 2021-09-21 DIAGNOSIS — I82411 Acute embolism and thrombosis of right femoral vein: Secondary | ICD-10-CM | POA: Diagnosis not present

## 2021-09-21 DIAGNOSIS — E119 Type 2 diabetes mellitus without complications: Secondary | ICD-10-CM | POA: Insufficient documentation

## 2021-09-21 DIAGNOSIS — G8929 Other chronic pain: Secondary | ICD-10-CM | POA: Diagnosis not present

## 2021-09-21 DIAGNOSIS — M545 Low back pain, unspecified: Secondary | ICD-10-CM | POA: Insufficient documentation

## 2021-09-21 DIAGNOSIS — Z8507 Personal history of malignant neoplasm of pancreas: Secondary | ICD-10-CM | POA: Diagnosis not present

## 2021-09-21 DIAGNOSIS — D6851 Activated protein C resistance: Secondary | ICD-10-CM

## 2021-09-21 DIAGNOSIS — I82401 Acute embolism and thrombosis of unspecified deep veins of right lower extremity: Secondary | ICD-10-CM

## 2021-09-21 DIAGNOSIS — D649 Anemia, unspecified: Secondary | ICD-10-CM

## 2021-09-21 LAB — CBC WITH DIFFERENTIAL (CANCER CENTER ONLY)
Abs Immature Granulocytes: 0.03 10*3/uL (ref 0.00–0.07)
Basophils Absolute: 0.1 10*3/uL (ref 0.0–0.1)
Basophils Relative: 1 %
Eosinophils Absolute: 0.3 10*3/uL (ref 0.0–0.5)
Eosinophils Relative: 4 %
HCT: 39.2 % (ref 39.0–52.0)
Hemoglobin: 12.9 g/dL — ABNORMAL LOW (ref 13.0–17.0)
Immature Granulocytes: 0 %
Lymphocytes Relative: 26 %
Lymphs Abs: 2 10*3/uL (ref 0.7–4.0)
MCH: 29.9 pg (ref 26.0–34.0)
MCHC: 32.9 g/dL (ref 30.0–36.0)
MCV: 91 fL (ref 80.0–100.0)
Monocytes Absolute: 1 10*3/uL (ref 0.1–1.0)
Monocytes Relative: 13 %
Neutro Abs: 4.3 10*3/uL (ref 1.7–7.7)
Neutrophils Relative %: 56 %
Platelet Count: 405 10*3/uL — ABNORMAL HIGH (ref 150–400)
RBC: 4.31 MIL/uL (ref 4.22–5.81)
RDW: 14 % (ref 11.5–15.5)
WBC Count: 7.7 10*3/uL (ref 4.0–10.5)
nRBC: 0.7 % — ABNORMAL HIGH (ref 0.0–0.2)

## 2021-09-21 LAB — CMP (CANCER CENTER ONLY)
ALT: 21 U/L (ref 0–44)
AST: 19 U/L (ref 15–41)
Albumin: 4.1 g/dL (ref 3.5–5.0)
Alkaline Phosphatase: 78 U/L (ref 38–126)
Anion gap: 8 (ref 5–15)
BUN: 11 mg/dL (ref 8–23)
CO2: 22 mmol/L (ref 22–32)
Calcium: 10.8 mg/dL — ABNORMAL HIGH (ref 8.9–10.3)
Chloride: 104 mmol/L (ref 98–111)
Creatinine: 1.07 mg/dL (ref 0.61–1.24)
GFR, Estimated: >60 mL/min (ref >60)
Glucose, Bld: 223 mg/dL — ABNORMAL HIGH (ref 70–99)
Potassium: 4.5 mmol/L (ref 3.5–5.1)
Sodium: 134 mmol/L — ABNORMAL LOW (ref 135–145)
Total Bilirubin: 0.4 mg/dL (ref 0.3–1.2)
Total Protein: 7 g/dL (ref 6.5–8.1)

## 2021-09-21 LAB — FERRITIN: Ferritin: 86 ng/mL (ref 24–336)

## 2021-09-21 NOTE — Telephone Encounter (Signed)
Per 09/21/21 los - gave upcoming appointments - confirmed

## 2021-09-21 NOTE — Progress Notes (Signed)
Hematology and Oncology Follow Up Visit  Derek [name] [id number] [birth year] 76 y.o. 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 twice daily     Interim History:  Derek [name] is back for follow-up.  He was last seen by [place] back in January.  At that time, he had some hematuria.  He had a history of bilateral lower extremity DVTs.  I am not sure why I am he was sent to [place] because of the hematuria.  He needed to be on anticoagulation.  We had him hold the Eliquis for couple days and then get this restarted.  Has not had problems with hematuria since.  The real problem is that he developed another blood clot.  This was back on 09/02/2021.  This was in his right leg.  He had been off his Eliquis.  His family doctor told him to stop the Eliquis.  He began to have swelling in the right leg.  He subsequently underwent a Doppler.  This showed a thrombus in the right common femoral, profundofemoral, femoral popliteal veins.  For some reason, he had no other Doppler done 10 days later.  This confirmed the blood clot.  He was started back on Eliquis.  He was started on the loading dose.  His leg is still swollen but seems to be doing a little bit better.  Unfortunately, I still think there is much else that we can do for this right now.  He had a filter put in.  I am sure the IVC filter is probably a contributor to this blood clot.  He was seen by Vascular Surgery.  They do not think that he would benefit from thrombectomy.  I suspect that he probably has damage in the vein on the right leg.  He does have diabetes.  I think this is going to make everything little bit more difficult for him.  He has horrible lower back pain.  He is supposed to get radioablation.  I think this can be in a week or so.  However, I really think that he needs to stay on anticoagulation.  I probably would put off doing  any type of radio ablation of his back for about 3 months.  There is been no bleeding.  He has had no fever.  There is been no chest wall pain.  He has had no cough.  Overall, I was his performance status is probably ECOG 2.    Medications:  Current Outpatient Medications:    APIXABAN (ELIQUIS) VTE STARTER PACK ('10MG'$  AND '5MG'$ ), Take as directed on package: start with two-'5mg'$  tablets twice daily for 7 days. On day 8, switch to one-'5mg'$  tablet twice daily., Disp: 74 each, Rfl: 0   carvedilol (COREG) 25 MG tablet, Take 1 tablet by mouth once a day every evening, Disp: 90 tablet, Rfl: 4   Continuous Blood Gluc Receiver (FREESTYLE LIBRE 2 READER) DEVI, use as directed to check blood sugar, Disp: 1 each, Rfl: 0   Continuous Blood Gluc Sensor (FREESTYLE LIBRE 2 SENSOR) MISC, Use as directed, Disp: 6 each, Rfl: 3   Dulaglutide (TRULICITY) 3 QV/9.5GL SOPN, Inject 0.5 mLs (3 mg total) into the skin every 7 days., Disp: 6 mL, Rfl: 1   empagliflozin (JARDIANCE) 25 MG TABS tablet, Take 1 tablet (25 mg total) by mouth daily., Disp: 90 tablet, Rfl: 1   fentaNYL (DURAGESIC) 25 MCG/HR, Place  1 patch onto the skin every 3 (three) days., Disp: 5 patch, Rfl: 0   furosemide (LASIX) 20 MG tablet, Take 20 mg by mouth daily as needed for edema., Disp: , Rfl:    insulin aspart (NOVOLOG) 100 UNIT/ML injection, USE 100U/DAY SUBCUTANEOUS VIA PUMP 30 DAY(S) (Patient taking differently: Inject 5-30 Units into the skin See admin instructions. Inject 5-30 units into the skin three times a day before meals, PER SLIDING SCALE), Disp: 30 mL, Rfl: 6   Insulin Aspart FlexPen (NOVOLOG) 100 UNIT/ML, Use as directed. Max daily dose of 100 units., Disp: 90 mL, Rfl: 1   Insulin Degludec FlexTouch 100 UNIT/ML SOPN, Inject 60 Units into the skin daily., Disp: 60 mL, Rfl: 1   Insulin Pen Needle (PENTIPS) 31G X 5 MM MISC, Use 4 times daily as directed, Disp: 400 each, Rfl: 3   Insulin Syringe-Needle U-100 31G X 5/16" 1 ML MISC, Use as  directed to inject insulin., Disp: 200 each, Rfl: 13   Iron-FA-B Cmp-C-Biot-Probiotic (FUSION PLUS) CAPS, Take 1 capsule by mouth daily., Disp: 30 capsule, Rfl: 11   lansoprazole (PREVACID) 30 MG capsule, TAKE 1 CAPSULE BY MOUTH ONCE DAILY, Disp: 90 capsule, Rfl: 4   lidocaine (LIDODERM) 5 %, Place 1 patch onto the skin daily. Remove & Discard patch within 12 hours or as directed by MD, Disp: 30 patch, Rfl: 0   losartan (COZAAR) 50 MG tablet, Take 1 tablet by mouth once every evening, Disp: 90 tablet, Rfl: 4   magnesium oxide (MAG-OX) 400 MG tablet, Take 800 mg by mouth at bedtime. Triple complex, Disp: , Rfl:    nystatin (MYCOSTATIN) 100000 UNIT/ML suspension, Use 4-5 mL for Mouth/Throat as directed four times a day, Disp: 240 mL, Rfl: 3   oxybutynin (DITROPAN) 5 MG tablet, Take 1 tablet (5 mg total) by mouth every 6 (six) to 8 (eight) hours as needed for urinary urgency/frequency/leakage., Disp: 90 tablet, Rfl: 11   pravastatin (PRAVACHOL) 20 MG tablet, Take 1 tablet by mouth twice a week, Disp: 40 tablet, Rfl: 4   QUEtiapine (SEROQUEL) 25 MG tablet, Take 1 tablet by mouth once a day at bedtime, Disp: 30 tablet, Rfl: 1   tamsulosin (FLOMAX) 0.4 MG CAPS capsule, take 1 capsule by mouth at bedtime, Disp: 30 capsule, Rfl: 3   Tdap (BOOSTRIX) 5-2.5-18.5 LF-MCG/0.5 injection, Inject into the muscle., Disp: 0.5 mL, Rfl: 0   triamcinolone cream (KENALOG) 0.1 %, Apply externally two times a day for 30 days., Disp: 80 g, Rfl: 3   UNABLE TO FIND, Apply 1 Tube topically as needed. Med Name: [place] DREAM CREAM, Disp: , Rfl:   Allergies:  Allergies  Allergen Reactions   Diclofenac Swelling and Other (See Comments)    Elevated liver enzymes and lips swell Elevated liver enzymes Elevated liver enzymes Elevated liver enzymes and lips swell Elevated liver enzymes and lips swell   Doxycycline Other (See Comments)    Severe nose bleeds Severe nose bleeds Severe nose bleeds Epistaxis Epistaxis    Doxycycline Hyclate Other (See Comments)    Epistaxis   Duloxetine Other (See Comments)    Night sweats Night sweats Night sweats Night sweats Night sweats    Hydrocodone Swelling and Other (See Comments)    Facial swelling- can tolerate "immediate-release" Hydrocodone, though   Oxybutynin Swelling and Other (See Comments)    Swelling  Swelling Swelling    Quinine Other (See Comments)    Platelets dropped- consumptive coagulopathy Platelets dropped- consumptive coagulopathy Platelets dropped Other reaction(s):  Unknown    Duloxetine Hcl Other (See Comments)    Night sweats   Gabapentin Other (See Comments)    Unknown Reaction Reaction?? Unknown Reaction    Morphine And Related Nausea And Vomiting, Swelling and Other (See Comments)    Facial swelling and lips swell, but tolerates immediate-release oxycodone as well as hydrocodone    Oxycontin [Oxycodone Hcl] Swelling and Other (See Comments)    Lips swell, but tolerates immediate-release oxycodone as well as hydrocodone   Sertraline Other (See Comments)    Can't sleep or urinate Can't sleep or urinate Other reaction(s): Adverse Reaction Urinary problems and insomnia Can't sleep or urinate Urinary problems and insomnia   Sertraline Hcl Other (See Comments)    Urinary problems and insomnia   Tape Other (See Comments)    SKIN IS FRAGILE- CERTAIN BANDAGES TEAR OFF THE SKIN   Voltaren [Diclofenac Sodium] Other (See Comments)    Elevated liver enzymes    Past Medical History, Surgical history, Social history, and Family History were reviewed and updated.  Review of Systems: Review of Systems  Constitutional:  Positive for fatigue.  HENT:  Negative.    Eyes: Negative.   Respiratory: Negative.    Cardiovascular:  Positive for leg swelling.  Gastrointestinal: Negative.   Endocrine: Negative.   Genitourinary:  Positive for hematuria.   Musculoskeletal:  Positive for back pain, myalgias and neck pain.  Neurological:   Positive for dizziness and headaches.  Hematological: Negative.   Psychiatric/Behavioral: Negative.      Physical Exam:  weight is 262 lb (118.8 kg). His oral temperature is 98.4 F (36.9 C). His blood pressure is 124/68 and his pulse is 75. His respiration is 20 and oxygen saturation is 97%.   Wt Readings from Last 3 Encounters:  09/21/21 262 lb (118.8 kg)  09/12/21 260 lb (117.9 kg)  09/02/21 257 lb 8 oz (116.8 kg)    Physical Exam Vitals reviewed.  HENT:     Head: Normocephalic and atraumatic.  Eyes:     Pupils: Pupils are equal, round, and reactive to light.  Cardiovascular:     Rate and Rhythm: Normal rate and regular rhythm.     Heart sounds: Normal heart sounds.  Pulmonary:     Effort: Pulmonary effort is normal.     Breath sounds: Normal breath sounds.  Abdominal:     General: Bowel sounds are normal.     Palpations: Abdomen is soft.  Musculoskeletal:        General: No tenderness or deformity. Normal range of motion.     Cervical back: Normal range of motion.     Comments: His extremities does show some swelling in the right leg.  Left leg is unremarkable.  Most the swelling is in the right lower leg.  I cannot palpate any venous cord.  He does have a positive Homans sign with the right leg.  He has good pulses in his distal extremities bilaterally.  Lymphadenopathy:     Cervical: No cervical adenopathy.  Skin:    General: Skin is warm and dry.     Findings: No erythema or rash.  Neurological:     Mental Status: He is alert and oriented to person, place, and time.  Psychiatric:        Behavior: Behavior normal.        Thought Content: Thought content normal.        Judgment: Judgment normal.      Lab Results  Component Value Date  WBC 7.7 09/21/2021   HGB 12.9 (L) 09/21/2021   HCT 39.2 09/21/2021   MCV 91.0 09/21/2021   PLT 405 (H) 09/21/2021     Chemistry      Component Value Date/Time   NA 134 (L) 09/21/2021 1350   K 4.5 09/21/2021 1350   CL  104 09/21/2021 1350   CO2 22 09/21/2021 1350   BUN 11 09/21/2021 1350   CREATININE 1.07 09/21/2021 1350      Component Value Date/Time   CALCIUM 10.8 (H) 09/21/2021 1350   ALKPHOS 78 09/21/2021 1350   AST 19 09/21/2021 1350   ALT 21 09/21/2021 1350   BILITOT 0.4 09/21/2021 1350       Impression and Plan: Mr. Stowers is a very nice 76 year old white male.  He has had incredible history.  He has had a past history of a neuroendocrine pancreatic tumor.  He apparently was hospitalized for 4 months because of complications.  He has factor V Leiden.  I do not know if this is heterozygous or homozygous mutation.  I do not think it makes any difference because he is on lifelong anticoagulation now.  Again I would not do any invasive procedures unless they were necessary for about 3 months.  I really would like to try to keep him on continuous anticoagulation for good 3 months before any invasive procedure is done and he would have to come off the Eliquis.  I just feel bad for him.  I think he will always have some element of leg swelling with the left leg.  I suspect he probably has some venous damage because of the clots he may have from his diabetes.  For right now, probably would like to get him back in about 3 to 4 weeks.  I would like to see how he is doing and see if the leg is improved with his swelling.   [name], MD 8/2/20235:26 PM

## 2021-09-22 DIAGNOSIS — Z7901 Long term (current) use of anticoagulants: Secondary | ICD-10-CM | POA: Diagnosis not present

## 2021-09-22 DIAGNOSIS — Z86718 Personal history of other venous thrombosis and embolism: Secondary | ICD-10-CM | POA: Diagnosis not present

## 2021-09-22 DIAGNOSIS — Z09 Encounter for follow-up examination after completed treatment for conditions other than malignant neoplasm: Secondary | ICD-10-CM | POA: Diagnosis not present

## 2021-09-22 LAB — IRON AND IRON BINDING CAPACITY (CC-WL,HP ONLY)
Iron: 60 ug/dL (ref 45–182)
Saturation Ratios: 18 % (ref 17.9–39.5)
TIBC: 332 ug/dL (ref 250–450)
UIBC: 272 ug/dL (ref 117–376)

## 2021-09-23 ENCOUNTER — Other Ambulatory Visit (HOSPITAL_BASED_OUTPATIENT_CLINIC_OR_DEPARTMENT_OTHER): Payer: Self-pay

## 2021-09-23 DIAGNOSIS — J018 Other acute sinusitis: Secondary | ICD-10-CM | POA: Diagnosis not present

## 2021-09-23 DIAGNOSIS — A09 Infectious gastroenteritis and colitis, unspecified: Secondary | ICD-10-CM | POA: Diagnosis not present

## 2021-09-23 DIAGNOSIS — B349 Viral infection, unspecified: Secondary | ICD-10-CM | POA: Diagnosis not present

## 2021-09-23 MED ORDER — AMOXICILLIN 875 MG PO TABS
875.0000 mg | ORAL_TABLET | Freq: Two times a day (BID) | ORAL | 0 refills | Status: DC
Start: 2021-09-23 — End: 2021-10-05
  Filled 2021-09-23: qty 10, 5d supply, fill #0

## 2021-09-26 ENCOUNTER — Other Ambulatory Visit (HOSPITAL_BASED_OUTPATIENT_CLINIC_OR_DEPARTMENT_OTHER): Payer: Self-pay

## 2021-09-27 ENCOUNTER — Other Ambulatory Visit (HOSPITAL_BASED_OUTPATIENT_CLINIC_OR_DEPARTMENT_OTHER): Payer: Self-pay

## 2021-09-27 MED ORDER — ELIQUIS 5 MG PO TABS
5.0000 mg | ORAL_TABLET | Freq: Two times a day (BID) | ORAL | 1 refills | Status: DC
Start: 2021-09-27 — End: 2021-10-10
  Filled 2021-09-27: qty 180, 90d supply, fill #0

## 2021-10-02 ENCOUNTER — Encounter (HOSPITAL_BASED_OUTPATIENT_CLINIC_OR_DEPARTMENT_OTHER): Payer: Self-pay | Admitting: Emergency Medicine

## 2021-10-02 ENCOUNTER — Emergency Department (HOSPITAL_BASED_OUTPATIENT_CLINIC_OR_DEPARTMENT_OTHER)
Admission: EM | Admit: 2021-10-02 | Discharge: 2021-10-02 | Disposition: A | Discharge status: Home / Self Care | Payer: HMO | Attending: Emergency Medicine | Admitting: Emergency Medicine

## 2021-10-02 ENCOUNTER — Other Ambulatory Visit: Payer: Self-pay

## 2021-10-02 DIAGNOSIS — Z86718 Personal history of other venous thrombosis and embolism: Secondary | ICD-10-CM | POA: Diagnosis not present

## 2021-10-02 DIAGNOSIS — Z79899 Other long term (current) drug therapy: Secondary | ICD-10-CM | POA: Diagnosis not present

## 2021-10-02 DIAGNOSIS — Z7901 Long term (current) use of anticoagulants: Secondary | ICD-10-CM | POA: Diagnosis not present

## 2021-10-02 DIAGNOSIS — D6851 Activated protein C resistance: Secondary | ICD-10-CM | POA: Diagnosis not present

## 2021-10-02 DIAGNOSIS — Z794 Long term (current) use of insulin: Secondary | ICD-10-CM | POA: Insufficient documentation

## 2021-10-02 DIAGNOSIS — M7989 Other specified soft tissue disorders: Secondary | ICD-10-CM | POA: Diagnosis present

## 2021-10-02 DIAGNOSIS — I82411 Acute embolism and thrombosis of right femoral vein: Secondary | ICD-10-CM | POA: Diagnosis not present

## 2021-10-02 DIAGNOSIS — I82511 Chronic embolism and thrombosis of right femoral vein: Secondary | ICD-10-CM | POA: Diagnosis not present

## 2021-10-02 NOTE — ED Notes (Addendum)
Patient feet is swollen. States that it is more swollen than normal Patient has limited movement in rt foot .can bear wt Alert x4 Denies sob. Patients leg is red .

## 2021-10-02 NOTE — ED Provider Notes (Addendum)
[facility] EMERGENCY DEPARTMENT Provider Note   CSN: [id number] Arrival date & time: 10/02/21  1802     History  Chief Complaint  Patient presents with   Leg Swelling    Derek [name] is a 76 y.o. male.  Patient with known DVT to the right lower extremity.  Patient had been on Eliquis in the past and then was taken off of it by his primary care doctor and he developed a DVT July 14 restarted on Eliquis at that time.  Then seen again in follow-up on July 24 had Doppler studies of the legs at both times.  He has an IVC filter in place.  Past medical history significant for factor V Leyden mutation.  Patient's hematologist Dr. [name] saw him in August 2.  Notes show that he discussed it with vascular surgery and at that time they did not think thrombectomy was indicated.  Patient does have an IVC filter as well.  Patient without any shortness of breath or chest pain.  And he has been taking the Eliquis consistently since July 14.  Patient was referred in by his [facility] nurse practitioner.       Home Medications Prior to Admission medications   Medication Sig Start Date End Date Taking? Authorizing Provider  amoxicillin (AMOXIL) 875 MG tablet Take 1 tablet (875 mg total) by mouth 2 (two) times daily for 5 days. 09/23/21     apixaban (ELIQUIS) 5 MG TABS tablet Take 1 tablet (5 mg total) by mouth 2 (two) times daily. 09/27/21     APIXABAN (ELIQUIS) VTE STARTER PACK ('10MG'$  AND '5MG'$ ) Take as directed on package: start with two-'5mg'$  tablets twice daily for 7 days. On day 8, switch to one-'5mg'$  tablet twice daily. 09/02/21   Lucrezia Starch, MD  carvedilol (COREG) 25 MG tablet Take 1 tablet by mouth once a day every evening 07/08/21     Continuous Blood Gluc Receiver (FREESTYLE LIBRE 2 READER) DEVI use as directed to check blood sugar 02/01/21     Continuous Blood Gluc Sensor (FREESTYLE LIBRE 2 SENSOR) MISC Use as directed 12/07/20     Dulaglutide (TRULICITY) 3 ZS/0.1UX SOPN Inject 0.5  mLs (3 mg total) into the skin every 7 days. 08/16/21     empagliflozin (JARDIANCE) 25 MG TABS tablet Take 1 tablet (25 mg total) by mouth daily. 08/16/21     fentaNYL (DURAGESIC) 25 MCG/HR Place 1 patch onto the skin every 3 (three) days. 05/[birth year]/53   Campbell Stall P, DO  furosemide (LASIX) 20 MG tablet Take 20 mg by mouth daily as needed for edema.    [provider]  insulin aspart (NOVOLOG) 100 UNIT/ML injection USE 100U/DAY SUBCUTANEOUS VIA PUMP 30 DAY(S) Patient taking differently: Inject 5-30 Units into the skin See admin instructions. Inject 5-30 units into the skin three times a day before meals, PER SLIDING SCALE 12/23/19 05/23/21  Jacelyn Pi, MD  Insulin Aspart FlexPen (NOVOLOG) 100 UNIT/ML Use as directed. Max daily dose of 100 units. 06/16/21     Insulin Degludec FlexTouch 100 UNIT/ML SOPN Inject 60 Units into the skin daily. 08/16/21     Insulin Pen Needle (PENTIPS) 31G X 5 MM MISC Use 4 times daily as directed 06/16/21     Insulin Syringe-Needle U-100 31G X 5/16" 1 ML MISC Use as directed to inject insulin. 01/31/21     Iron-FA-B Cmp-C-Biot-Probiotic (FUSION PLUS) CAPS Take 1 capsule by mouth daily. 03/02/21   Celso Amy, NP  lansoprazole (PREVACID) 30  MG capsule TAKE 1 CAPSULE BY MOUTH ONCE DAILY 07/08/21     lidocaine (LIDODERM) 5 % Place 1 patch onto the skin daily. Remove & Discard patch within 12 hours or as directed by MD 12/01/20   Cristal Deer, MD  losartan (COZAAR) 50 MG tablet Take 1 tablet by mouth once every evening 07/08/21     magnesium oxide (MAG-OX) 400 MG tablet Take 800 mg by mouth at bedtime. Triple complex    [provider]  nystatin (MYCOSTATIN) 100000 UNIT/ML suspension Use 4-5 mL for Mouth/Throat as directed four times a day 07/08/21     oxybutynin (DITROPAN) 5 MG tablet Take 1 tablet (5 mg total) by mouth every 6 (six) to 8 (eight) hours as needed for urinary urgency/frequency/leakage. 05/26/21     pravastatin (PRAVACHOL) 20 MG tablet Take 1  tablet by mouth twice a week 07/08/21     QUEtiapine (SEROQUEL) 25 MG tablet Take 1 tablet by mouth once a day at bedtime 01/26/21     tamsulosin (FLOMAX) 0.4 MG CAPS capsule take 1 capsule by mouth at bedtime 09/19/21     Tdap (BOOSTRIX) 5-2.5-18.5 LF-MCG/0.5 injection Inject into the muscle. 08/22/21   Carlyle Basques, MD  triamcinolone cream (KENALOG) 0.1 % Apply externally two times a day for 30 days. 10/07/20     UNABLE TO FIND Apply 1 Tube topically as needed. Med Name: [place]    [provider]      Allergies    Diclofenac, Doxycycline, Doxycycline hyclate, Duloxetine, Hydrocodone, Oxybutynin, Quinine, Duloxetine hcl, Gabapentin, Morphine and related, Oxycontin [oxycodone hcl], Sertraline, Sertraline hcl, Tape, and Voltaren [diclofenac sodium]    Review of Systems   Review of Systems  Constitutional:  Negative for chills and fever.  HENT:  Negative for ear pain and sore throat.   Eyes:  Negative for pain and visual disturbance.  Respiratory:  Negative for cough and shortness of breath.   Cardiovascular:  Positive for leg swelling. Negative for chest pain and palpitations.  Gastrointestinal:  Negative for abdominal pain and vomiting.  Genitourinary:  Negative for dysuria and hematuria.  Musculoskeletal:  Negative for arthralgias and back pain.  Skin:  Negative for color change and rash.  Neurological:  Negative for seizures and syncope.  All other systems reviewed and are negative.   Physical Exam Updated Vital Signs BP (!) 119/54   Pulse 80   Temp 97.9 F (36.6 C) (Oral)   Resp 20   Ht 1.803 m ('5\' 11"'$ )   Wt 119.3 kg   SpO2 98%   BMI 36.68 kg/m  Physical Exam Vitals and nursing note reviewed.  Constitutional:      General: He is not in acute distress.    Appearance: Normal appearance. He is well-developed.  HENT:     Head: Normocephalic and atraumatic.  Eyes:     Conjunctiva/sclera: Conjunctivae normal.  Cardiovascular:     Rate and Rhythm:  Normal rate and regular rhythm.     Heart sounds: No murmur heard. Pulmonary:     Effort: Pulmonary effort is normal. No respiratory distress.     Breath sounds: Normal breath sounds.  Abdominal:     Palpations: Abdomen is soft.     Tenderness: There is no abdominal tenderness.  Musculoskeletal:        General: No swelling.     Cervical back: Neck supple.     Right lower leg: Edema present.     Left lower leg: No edema.  Comments: Slight erythema to the right lower extremity.  Significant swelling of the foot and calf area.  No significant swelling in the thigh.  Dorsalis pedis pulses 1+.  And excellent cap refill to the foot.  No wounds no open sores.  Nothing this is consistent with cellulitis.  Skin:    General: Skin is warm and dry.     Capillary Refill: Capillary refill takes less than 2 seconds.  Neurological:     General: No focal deficit present.     Mental Status: He is alert and oriented to person, place, and time.  Psychiatric:        Mood and Affect: Mood normal.     ED Results / Procedures / Treatments   Labs (all labs ordered are listed, but only abnormal results are displayed) Labs Reviewed - No data to display  EKG None  Radiology No results found.  Procedures Procedures    Medications Ordered in ED Medications - No data to display  ED Course/ Medical Decision Making/ A&P                           Medical Decision Making  Patient with known history of extensive right DVT.  Does have a filter in place as well.  First diagnosed on July 14.  Patient is on his Eliquis.  Do not see any evidence of phlegmasia alba dolens or phlegmasia Cerulea dolens.  Also patient does not have any chest pain or shortness of breath.  However do recommend close follow-up with Dr. [name].  Patient's wife will give his office a call tomorrow.  They can discuss consideration again with vascular surgery for thrombectomy.  But no acute emergency requiring that at this time.   So no concerns for compartment syndrome.  Patient has good blood flow into the leg.  Swelling of the foot no significant swelling of the thigh and swelling of the calf.    Final Clinical Impression(s) / ED Diagnoses Final diagnoses:  Chronic deep vein thrombosis (DVT) of femoral vein of right lower extremity [facility])    Rx / DC Orders ED Discharge Orders     None         Fredia Sorrow, MD 10/02/21 Marjo Bicker    Fredia Sorrow, MD 10/02/21 1949

## 2021-10-02 NOTE — ED Notes (Signed)
Rn attempted to stick patient was unsuccessful

## 2021-10-02 NOTE — ED Triage Notes (Signed)
Pt arrives pov, slow gait, c/o increase of RLE swelling. Taking elequis for DVT. Examined by EMS, referred to ED. Deneis increase in shob

## 2021-10-02 NOTE — Discharge Instructions (Signed)
Dr. [name] call tomorrow to see if he wants to revisit discussion with vascular surgery about thrombectomy.  Nothing appearing in acute emergency here tonight with the expected leg swelling at this point following the DVT.  But as we discussed things could change in the future.  Continue the Eliquis.

## 2021-10-03 ENCOUNTER — Other Ambulatory Visit: Payer: Self-pay | Admitting: *Deleted

## 2021-10-03 ENCOUNTER — Telehealth: Payer: Self-pay | Admitting: *Deleted

## 2021-10-03 DIAGNOSIS — I82401 Acute embolism and thrombosis of unspecified deep veins of right lower extremity: Secondary | ICD-10-CM

## 2021-10-03 NOTE — Telephone Encounter (Signed)
Received a call from Ivin Booty patients wife stating that patient was in the ED yesterday for right foot swelling.  No ultrasound was done and was told to call our office when they get home.  Dr [name] notified.  Doppler ultrasound ordered for tomorrow

## 2021-10-04 ENCOUNTER — Ambulatory Visit (HOSPITAL_BASED_OUTPATIENT_CLINIC_OR_DEPARTMENT_OTHER): Admission: RE | Admit: 2021-10-04 | Payer: HMO | Source: Ambulatory Visit

## 2021-10-04 ENCOUNTER — Other Ambulatory Visit: Payer: Self-pay | Admitting: Family

## 2021-10-04 ENCOUNTER — Ambulatory Visit (HOSPITAL_BASED_OUTPATIENT_CLINIC_OR_DEPARTMENT_OTHER)
Admission: RE | Admit: 2021-10-04 | Discharge: 2021-10-04 | Disposition: A | Discharge status: Home / Self Care | Payer: HMO | Source: Ambulatory Visit | Attending: Family | Admitting: Family

## 2021-10-04 DIAGNOSIS — Z7901 Long term (current) use of anticoagulants: Secondary | ICD-10-CM | POA: Diagnosis not present

## 2021-10-04 DIAGNOSIS — I82511 Chronic embolism and thrombosis of right femoral vein: Secondary | ICD-10-CM | POA: Diagnosis present

## 2021-10-04 DIAGNOSIS — I82431 Acute embolism and thrombosis of right popliteal vein: Secondary | ICD-10-CM | POA: Diagnosis not present

## 2021-10-04 DIAGNOSIS — I82401 Acute embolism and thrombosis of unspecified deep veins of right lower extremity: Secondary | ICD-10-CM

## 2021-10-04 DIAGNOSIS — D6851 Activated protein C resistance: Secondary | ICD-10-CM

## 2021-10-04 DIAGNOSIS — M79661 Pain in right lower leg: Secondary | ICD-10-CM | POA: Diagnosis present

## 2021-10-04 DIAGNOSIS — M549 Dorsalgia, unspecified: Secondary | ICD-10-CM | POA: Diagnosis present

## 2021-10-04 DIAGNOSIS — E1151 Type 2 diabetes mellitus with diabetic peripheral angiopathy without gangrene: Secondary | ICD-10-CM | POA: Diagnosis present

## 2021-10-04 DIAGNOSIS — I82411 Acute embolism and thrombosis of right femoral vein: Secondary | ICD-10-CM | POA: Diagnosis not present

## 2021-10-04 DIAGNOSIS — I82551 Chronic embolism and thrombosis of right peroneal vein: Secondary | ICD-10-CM | POA: Diagnosis not present

## 2021-10-04 DIAGNOSIS — Z794 Long term (current) use of insulin: Secondary | ICD-10-CM | POA: Diagnosis not present

## 2021-10-04 DIAGNOSIS — Z0289 Encounter for other administrative examinations: Secondary | ICD-10-CM | POA: Diagnosis not present

## 2021-10-04 DIAGNOSIS — I8290 Acute embolism and thrombosis of unspecified vein: Secondary | ICD-10-CM | POA: Diagnosis not present

## 2021-10-04 DIAGNOSIS — Z6835 Body mass index (BMI) 35.0-35.9, adult: Secondary | ICD-10-CM | POA: Diagnosis not present

## 2021-10-04 DIAGNOSIS — I10 Essential (primary) hypertension: Secondary | ICD-10-CM | POA: Diagnosis present

## 2021-10-04 DIAGNOSIS — G4733 Obstructive sleep apnea (adult) (pediatric): Secondary | ICD-10-CM | POA: Diagnosis present

## 2021-10-04 DIAGNOSIS — K219 Gastro-esophageal reflux disease without esophagitis: Secondary | ICD-10-CM | POA: Diagnosis present

## 2021-10-04 DIAGNOSIS — Z8507 Personal history of malignant neoplasm of pancreas: Secondary | ICD-10-CM | POA: Diagnosis not present

## 2021-10-04 DIAGNOSIS — E119 Type 2 diabetes mellitus without complications: Secondary | ICD-10-CM | POA: Diagnosis not present

## 2021-10-04 DIAGNOSIS — R2241 Localized swelling, mass and lump, right lower limb: Secondary | ICD-10-CM | POA: Diagnosis not present

## 2021-10-04 DIAGNOSIS — Z90411 Acquired partial absence of pancreas: Secondary | ICD-10-CM | POA: Diagnosis not present

## 2021-10-04 DIAGNOSIS — Z808 Family history of malignant neoplasm of other organs or systems: Secondary | ICD-10-CM | POA: Diagnosis not present

## 2021-10-04 DIAGNOSIS — Z9081 Acquired absence of spleen: Secondary | ICD-10-CM | POA: Diagnosis not present

## 2021-10-04 DIAGNOSIS — Z885 Allergy status to narcotic agent status: Secondary | ICD-10-CM | POA: Diagnosis not present

## 2021-10-04 DIAGNOSIS — N400 Enlarged prostate without lower urinary tract symptoms: Secondary | ICD-10-CM | POA: Diagnosis present

## 2021-10-04 DIAGNOSIS — H9191 Unspecified hearing loss, right ear: Secondary | ICD-10-CM | POA: Diagnosis present

## 2021-10-04 DIAGNOSIS — Z833 Family history of diabetes mellitus: Secondary | ICD-10-CM | POA: Diagnosis not present

## 2021-10-04 DIAGNOSIS — I708 Atherosclerosis of other arteries: Secondary | ICD-10-CM | POA: Diagnosis not present

## 2021-10-04 DIAGNOSIS — M5136 Other intervertebral disc degeneration, lumbar region: Secondary | ICD-10-CM | POA: Diagnosis present

## 2021-10-04 DIAGNOSIS — Z87891 Personal history of nicotine dependence: Secondary | ICD-10-CM | POA: Diagnosis not present

## 2021-10-04 DIAGNOSIS — G8929 Other chronic pain: Secondary | ICD-10-CM | POA: Diagnosis present

## 2021-10-04 DIAGNOSIS — H8101 Meniere's disease, right ear: Secondary | ICD-10-CM | POA: Diagnosis present

## 2021-10-04 DIAGNOSIS — E669 Obesity, unspecified: Secondary | ICD-10-CM | POA: Diagnosis present

## 2021-10-04 DIAGNOSIS — M5440 Lumbago with sciatica, unspecified side: Secondary | ICD-10-CM | POA: Diagnosis not present

## 2021-10-04 DIAGNOSIS — I4510 Unspecified right bundle-branch block: Secondary | ICD-10-CM | POA: Diagnosis present

## 2021-10-04 DIAGNOSIS — I82541 Chronic embolism and thrombosis of right tibial vein: Secondary | ICD-10-CM | POA: Diagnosis not present

## 2021-10-04 DIAGNOSIS — M1990 Unspecified osteoarthritis, unspecified site: Secondary | ICD-10-CM | POA: Diagnosis present

## 2021-10-04 DIAGNOSIS — Z8249 Family history of ischemic heart disease and other diseases of the circulatory system: Secondary | ICD-10-CM | POA: Diagnosis not present

## 2021-10-04 DIAGNOSIS — D3A8 Other benign neuroendocrine tumors: Secondary | ICD-10-CM | POA: Diagnosis not present

## 2021-10-04 DIAGNOSIS — Z96653 Presence of artificial knee joint, bilateral: Secondary | ICD-10-CM | POA: Diagnosis present

## 2021-10-05 ENCOUNTER — Other Ambulatory Visit: Payer: Self-pay

## 2021-10-05 ENCOUNTER — Emergency Department (HOSPITAL_COMMUNITY)
Admission: EM | Admit: 2021-10-05 | Discharge: 2021-10-05 | Discharge status: Against Medical Advice | Payer: HMO | Source: Home / Self Care

## 2021-10-05 ENCOUNTER — Inpatient Hospital Stay (HOSPITAL_BASED_OUTPATIENT_CLINIC_OR_DEPARTMENT_OTHER): Payer: HMO | Admitting: Hematology & Oncology

## 2021-10-05 ENCOUNTER — Inpatient Hospital Stay: Payer: HMO

## 2021-10-05 ENCOUNTER — Encounter: Payer: Self-pay | Admitting: Hematology & Oncology

## 2021-10-05 VITALS — BP 107/60 | HR 75 | Temp 98.1°F | Resp 20 | Ht 72.0 in | Wt 268.8 lb

## 2021-10-05 DIAGNOSIS — M79604 Pain in right leg: Secondary | ICD-10-CM | POA: Insufficient documentation

## 2021-10-05 DIAGNOSIS — I82401 Acute embolism and thrombosis of unspecified deep veins of right lower extremity: Secondary | ICD-10-CM | POA: Diagnosis not present

## 2021-10-05 DIAGNOSIS — Z5321 Procedure and treatment not carried out due to patient leaving prior to being seen by health care provider: Secondary | ICD-10-CM | POA: Insufficient documentation

## 2021-10-05 DIAGNOSIS — R2241 Localized swelling, mass and lump, right lower limb: Secondary | ICD-10-CM | POA: Insufficient documentation

## 2021-10-05 LAB — CMP (CANCER CENTER ONLY)
ALT: 14 U/L (ref 0–44)
AST: 13 U/L — ABNORMAL LOW (ref 15–41)
Albumin: 4.1 g/dL (ref 3.5–5.0)
Alkaline Phosphatase: 93 U/L (ref 38–126)
Anion gap: 8 (ref 5–15)
BUN: 18 mg/dL (ref 8–23)
CO2: 24 mmol/L (ref 22–32)
Calcium: 11 mg/dL — ABNORMAL HIGH (ref 8.9–10.3)
Chloride: 103 mmol/L (ref 98–111)
Creatinine: 1.16 mg/dL (ref 0.61–1.24)
GFR, Estimated: >60 mL/min (ref >60)
Glucose, Bld: 160 mg/dL — ABNORMAL HIGH (ref 70–99)
Potassium: 4.3 mmol/L (ref 3.5–5.1)
Sodium: 135 mmol/L (ref 135–145)
Total Bilirubin: 0.3 mg/dL (ref 0.3–1.2)
Total Protein: 7.3 g/dL (ref 6.5–8.1)

## 2021-10-05 LAB — CBC
HCT: 42 % (ref 39.0–52.0)
Hemoglobin: 13 g/dL (ref 13.0–17.0)
MCH: 30.1 pg (ref 26.0–34.0)
MCHC: 31 g/dL (ref 30.0–36.0)
MCV: 97.2 fL (ref 80.0–100.0)
Platelets: 433 10*3/uL — ABNORMAL HIGH (ref 150–400)
RBC: 4.32 MIL/uL (ref 4.22–5.81)
RDW: 14.4 % (ref 11.5–15.5)
WBC: 8.2 10*3/uL (ref 4.0–10.5)
nRBC: 0 % (ref 0.0–0.2)

## 2021-10-05 LAB — CBC WITH DIFFERENTIAL (CANCER CENTER ONLY)
Abs Immature Granulocytes: 0.02 10*3/uL (ref 0.00–0.07)
Basophils Absolute: 0.1 10*3/uL (ref 0.0–0.1)
Basophils Relative: 2 %
Eosinophils Absolute: 0.3 10*3/uL (ref 0.0–0.5)
Eosinophils Relative: 5 %
HCT: 38.9 % — ABNORMAL LOW (ref 39.0–52.0)
Hemoglobin: 12.5 g/dL — ABNORMAL LOW (ref 13.0–17.0)
Immature Granulocytes: 0 %
Lymphocytes Relative: 32 %
Lymphs Abs: 2.2 10*3/uL (ref 0.7–4.0)
MCH: 29.3 pg (ref 26.0–34.0)
MCHC: 32.1 g/dL (ref 30.0–36.0)
MCV: 91.3 fL (ref 80.0–100.0)
Monocytes Absolute: 1.1 10*3/uL — ABNORMAL HIGH (ref 0.1–1.0)
Monocytes Relative: 17 %
Neutro Abs: 3.1 10*3/uL (ref 1.7–7.7)
Neutrophils Relative %: 44 %
Platelet Count: 468 10*3/uL — ABNORMAL HIGH (ref 150–400)
RBC: 4.26 MIL/uL (ref 4.22–5.81)
RDW: 14 % (ref 11.5–15.5)
WBC Count: 6.9 10*3/uL (ref 4.0–10.5)
nRBC: 0.3 % — ABNORMAL HIGH (ref 0.0–0.2)

## 2021-10-05 LAB — BASIC METABOLIC PANEL
Anion gap: 10 (ref 5–15)
BUN: 16 mg/dL (ref 8–23)
CO2: 20 mmol/L — ABNORMAL LOW (ref 22–32)
Calcium: 10.7 mg/dL — ABNORMAL HIGH (ref 8.9–10.3)
Chloride: 107 mmol/L (ref 98–111)
Creatinine, Ser: 1.16 mg/dL (ref 0.61–1.24)
GFR, Estimated: >60 mL/min (ref >60)
Glucose, Bld: 119 mg/dL — ABNORMAL HIGH (ref 70–99)
Potassium: 5.2 mmol/L — ABNORMAL HIGH (ref 3.5–5.1)
Sodium: 137 mmol/L (ref 135–145)

## 2021-10-05 LAB — D-DIMER, QUANTITATIVE: D-Dimer, Quant: 0.8 ug/mL-FEU — ABNORMAL HIGH (ref 0.00–0.50)

## 2021-10-05 NOTE — Progress Notes (Addendum)
Hematology and Oncology Follow Up Visit  Derek [name] [id number] May 18, 1945 76 y.o. 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 twice daily     Interim History:  Derek [name] is back for her scheduled visit.  Unfortunately, the clot in the right leg is certainly worse.  His wife called a couple days ago saying that the right leg was more swollen and painful.  We did get a Doppler of that leg.  This was done on 10/04/2021.  The Doppler for his showed extensive thrombus that is occlusive in the right leg.    He has a hard time walking.  The leg is quite swollen and firm.  The distal right foot has a little bit of bluish tinge.  He is able to move his toes.  Is hard to palpate any pulse because of the swelling and firmness.  The temperature to touch does seem to be warm.  I think part of the problem is that he has a filter in.  I suppose that the filter could certainly be blocked up.  He has had no cough or shortness of breath.  He has had no chest wall pain.  He has had no nausea or vomiting.  Is been no change in bowel or bladder habits.  He does have a myriad of health issues.  He does have diabetes.  He is on a fentanyl patch for chronic pain.  He has had no fever.  Overall, I would say his performance status is probably ECOG 2.   Medications:  Current Outpatient Medications:    apixaban (ELIQUIS) 5 MG TABS tablet, Take 1 tablet (5 mg total) by mouth 2 (two) times daily., Disp: 180 tablet, Rfl: 1   carvedilol (COREG) 25 MG tablet, Take 1 tablet by mouth once a day every evening, Disp: 90 tablet, Rfl: 4   Dulaglutide (TRULICITY) 3 KT/6.2BW SOPN, Inject 0.5 mLs (3 mg total) into the skin every 7 days., Disp: 6 mL, Rfl: 1   empagliflozin (JARDIANCE) 25 MG TABS tablet, Take 1 tablet (25 mg total) by mouth daily., Disp: 90 tablet, Rfl: 1   fentaNYL (DURAGESIC) 25 MCG/HR, Place 1  patch onto the skin every 3 (three) days., Disp: 5 patch, Rfl: 0   furosemide (LASIX) 20 MG tablet, Take 20 mg by mouth daily as needed for edema., Disp: , Rfl:    Iron-FA-B Cmp-C-Biot-Probiotic (FUSION PLUS) CAPS, Take 1 capsule by mouth daily., Disp: 30 capsule, Rfl: 11   lansoprazole (PREVACID) 30 MG capsule, TAKE 1 CAPSULE BY MOUTH ONCE DAILY, Disp: 90 capsule, Rfl: 4   lidocaine (LIDODERM) 5 %, Place 1 patch onto the skin daily. Remove & Discard patch within 12 hours or as directed by MD, Disp: 30 patch, Rfl: 0   losartan (COZAAR) 50 MG tablet, Take 1 tablet by mouth once every evening, Disp: 90 tablet, Rfl: 4   magnesium oxide (MAG-OX) 400 MG tablet, Take 800 mg by mouth at bedtime. Triple complex, Disp: , Rfl:    nystatin (MYCOSTATIN) 100000 UNIT/ML suspension, Use 4-5 mL for Mouth/Throat as directed four times a day, Disp: 240 mL, Rfl: 3   oxybutynin (DITROPAN) 5 MG tablet, Take 1 tablet (5 mg total) by mouth every 6 (six) to 8 (eight) hours as needed for urinary urgency/frequency/leakage., Disp: 90 tablet, Rfl: 11   pravastatin (PRAVACHOL) 20 MG tablet, Take 1 tablet by mouth twice a  week, Disp: 40 tablet, Rfl: 4   tamsulosin (FLOMAX) 0.4 MG CAPS capsule, take 1 capsule by mouth at bedtime, Disp: 30 capsule, Rfl: 3   triamcinolone cream (KENALOG) 0.1 %, Apply externally two times a day for 30 days., Disp: 80 g, Rfl: 3   UNABLE TO FIND, Apply 1 Tube topically as needed. Med Name: AUSTRALIAN DREAM CREAM, Disp: , Rfl:    insulin aspart (NOVOLOG) 100 UNIT/ML injection, USE 100U/DAY SUBCUTANEOUS VIA PUMP 30 DAY(S) (Patient taking differently: Inject 5-30 Units into the skin See admin instructions. Inject 5-30 units into the skin three times a day before meals, PER SLIDING SCALE), Disp: 30 mL, Rfl: 6  Allergies:  Allergies  Allergen Reactions   Diclofenac Swelling and Other (See Comments)    Elevated liver enzymes and lips swell    Doxycycline Other (See Comments)    Severe nose bleeds     Doxycycline Hyclate Other (See Comments)    Epistaxis   Duloxetine Other (See Comments)    Night sweats     Hydrocodone Swelling and Other (See Comments)    Facial swelling- can tolerate "immediate-release" Hydrocodone, though   Oxybutynin Swelling and Other (See Comments)    Swelling    Quinine Other (See Comments)    Platelets dropped- consumptive coagulopathy     Sertraline Other (See Comments)    Can't sleep or urinate   Duloxetine Hcl Other (See Comments)    Night sweats   Morphine And Related Nausea And Vomiting, Swelling and Other (See Comments)    Facial swelling and lips swell, but tolerates immediate-release oxycodone as well as hydrocodone    Oxycontin [Oxycodone Hcl] Swelling and Other (See Comments)    Lips swell, but tolerates immediate-release oxycodone as well as hydrocodone   Sertraline Hcl Other (See Comments)    Urinary problems and insomnia   Tape Other (See Comments)    SKIN IS FRAGILE- CERTAIN BANDAGES TEAR OFF THE SKIN   Voltaren [Diclofenac Sodium] Other (See Comments)    Elevated liver enzymes   Gabapentin Other (See Comments)         Past Medical History, Surgical history, Social history, and Family History were reviewed and updated.  Review of Systems: Review of Systems  Constitutional:  Positive for fatigue.  HENT:  Negative.    Eyes: Negative.   Respiratory: Negative.    Cardiovascular:  Positive for leg swelling.  Gastrointestinal: Negative.   Endocrine: Negative.   Genitourinary:  Positive for hematuria.   Musculoskeletal:  Positive for back pain, myalgias and neck pain.  Neurological:  Positive for dizziness and headaches.  Hematological: Negative.   Psychiatric/Behavioral: Negative.      Physical Exam:  height is 6' (1.829 m) and weight is 268 lb 12.8 oz (121.9 kg). His oral temperature is 98.1 F (36.7 C). His blood pressure is 107/60 and his pulse is 75. His respiration is 20 and oxygen saturation is 97%.   Wt Readings from  Last 3 Encounters:  10/05/21 268 lb 12.8 oz (121.9 kg)  10/02/21 263 lb (119.3 kg)  09/21/21 262 lb (118.8 kg)    Physical Exam Vitals reviewed.  HENT:     Head: Normocephalic and atraumatic.  Eyes:     Pupils: Pupils are equal, round, and reactive to light.  Cardiovascular:     Rate and Rhythm: Normal rate and regular rhythm.     Heart sounds: Normal heart sounds.  Pulmonary:     Effort: Pulmonary effort is normal.     Breath  sounds: Normal breath sounds.  Abdominal:     General: Bowel sounds are normal.     Palpations: Abdomen is soft.  Musculoskeletal:        General: No tenderness or deformity. Normal range of motion.     Cervical back: Normal range of motion.     Comments: His extremities does show marked swelling in the right leg.  The right leg is firm.  This skin temperature is warm.  There is some slight bluish coloration of the distal right foot and toes.  He does have decent range of motion of the joints although the right knee does not have a lot of flexibility because of the swelling.    Lymphadenopathy:     Cervical: No cervical adenopathy.  Skin:    General: Skin is warm and dry.     Findings: No erythema or rash.  Neurological:     Mental Status: He is alert and oriented to person, place, and time.  Psychiatric:        Behavior: Behavior normal.        Thought Content: Thought content normal.        Judgment: Judgment normal.      Lab Results  Component Value Date   WBC 6.9 10/05/2021   HGB 12.5 (L) 10/05/2021   HCT 38.9 (L) 10/05/2021   MCV 91.3 10/05/2021   PLT 468 (H) 10/05/2021     Chemistry      Component Value Date/Time   NA 135 10/05/2021 1529   K 4.3 10/05/2021 1529   CL 103 10/05/2021 1529   CO2 24 10/05/2021 1529   BUN 18 10/05/2021 1529   CREATININE 1.16 10/05/2021 1529      Component Value Date/Time   CALCIUM 11.0 (H) 10/05/2021 1529   ALKPHOS 93 10/05/2021 1529   AST 13 (L) 10/05/2021 1529   ALT 14 10/05/2021 1529   BILITOT  0.3 10/05/2021 1529       Impression and Plan: Mr. Maese is a very nice 76 year old white male.  He has had incredible history.  He has had a past history of a neuroendocrine pancreatic tumor.  He apparently was hospitalized for 4 months because of complications.  Unfortunately, he does not seem to be responding to the Eliquis.  The right leg clearly is more swollen.  The blood clot has not improved.  He is affected by this clot.  I think he is going to have to be in the hospital.  He needs IV heparin.  I do not know if vascular surgery can do a thrombectomy.  He does have the IVC filter and the filter might be blocked up.  I am not sure if he would be a candidate for thrombolytic therapy given all of his other health problems.  Clearly, cancer is not a problem for him.  I spoke to the ER MD at [facility].  He will be seen there.  I will find him in the hospital.  Again, I would think Vascular Surgery or Interventional radiology will need to see him and see if they can offer any invasive procedure for him to try to help with the leg swelling and pain.    [name], MD 8/16/20235:40 PM

## 2021-10-05 NOTE — ED Provider Triage Note (Signed)
Emergency Medicine Provider Triage Evaluation Note  Derek [name] , a 76 y.o. male  was evaluated in triage.  Pt complains of right leg no DVT that is worsening despite anticoagulation, IVC filter.  He was sent over by Dr. Antonieta Pert office for vascular or IR consultation for removal of his blood clot.  I am reaching out to them to see what they would like or need for his work-up.  Patient endorses pain to the right leg..  Review of Systems  Positive: Blood clot, right leg swelling, pain Negative: Shortness of breath, chest pain  Physical Exam  BP 134/79   Pulse 80   Temp 97.6 F (36.4 C) (Oral)   Resp 18   Ht 6' (1.829 m)   Wt 121.6 kg   SpO2 98%   BMI 36.35 kg/m  Gen:   Awake, no distress   Resp:  Normal effort  MSK:   Moves extremities without difficulty  Other:    Medical Decision Making  Medically screening exam initiated at 7:23 PM.  Appropriate orders placed.  [name] was informed that the remainder of the evaluation will be completed by another provider, this initial triage assessment does not replace that evaluation, and the importance of remaining in the ED until their evaluation is complete.  Contacted vascular to let them know that patient is in the emergency department   [name], [place] 10/05/21 1925

## 2021-10-05 NOTE — ED Notes (Signed)
Pt left AMA °

## 2021-10-05 NOTE — ED Triage Notes (Signed)
Pt was referred to ED by Hematologist/Oncologist d/t concerns for worsening right leg clot. Has been on eliquis and ASA daily. Denies CP/SOB.

## 2021-10-06 ENCOUNTER — Telehealth: Payer: Self-pay | Admitting: *Deleted

## 2021-10-06 ENCOUNTER — Encounter (HOSPITAL_COMMUNITY): Payer: Self-pay

## 2021-10-06 ENCOUNTER — Emergency Department (HOSPITAL_BASED_OUTPATIENT_CLINIC_OR_DEPARTMENT_OTHER): Payer: HMO

## 2021-10-06 ENCOUNTER — Inpatient Hospital Stay (HOSPITAL_BASED_OUTPATIENT_CLINIC_OR_DEPARTMENT_OTHER)
Admission: EM | Admit: 2021-10-06 | Discharge: 2021-10-10 | DRG: 271 | Disposition: A | Discharge status: Home / Self Care | Payer: HMO | Source: Ambulatory Visit | Attending: Internal Medicine | Admitting: Internal Medicine

## 2021-10-06 ENCOUNTER — Encounter (HOSPITAL_BASED_OUTPATIENT_CLINIC_OR_DEPARTMENT_OTHER): Payer: Self-pay | Admitting: Emergency Medicine

## 2021-10-06 DIAGNOSIS — G4733 Obstructive sleep apnea (adult) (pediatric): Secondary | ICD-10-CM | POA: Diagnosis present

## 2021-10-06 DIAGNOSIS — Z96653 Presence of artificial knee joint, bilateral: Secondary | ICD-10-CM | POA: Diagnosis present

## 2021-10-06 DIAGNOSIS — E119 Type 2 diabetes mellitus without complications: Secondary | ICD-10-CM

## 2021-10-06 DIAGNOSIS — H9191 Unspecified hearing loss, right ear: Secondary | ICD-10-CM | POA: Diagnosis present

## 2021-10-06 DIAGNOSIS — I82401 Acute embolism and thrombosis of unspecified deep veins of right lower extremity: Secondary | ICD-10-CM | POA: Diagnosis not present

## 2021-10-06 DIAGNOSIS — Z87891 Personal history of nicotine dependence: Secondary | ICD-10-CM

## 2021-10-06 DIAGNOSIS — N400 Enlarged prostate without lower urinary tract symptoms: Secondary | ICD-10-CM | POA: Diagnosis present

## 2021-10-06 DIAGNOSIS — D6851 Activated protein C resistance: Secondary | ICD-10-CM | POA: Diagnosis present

## 2021-10-06 DIAGNOSIS — M549 Dorsalgia, unspecified: Secondary | ICD-10-CM | POA: Diagnosis present

## 2021-10-06 DIAGNOSIS — Z8249 Family history of ischemic heart disease and other diseases of the circulatory system: Secondary | ICD-10-CM | POA: Diagnosis not present

## 2021-10-06 DIAGNOSIS — Z885 Allergy status to narcotic agent status: Secondary | ICD-10-CM

## 2021-10-06 DIAGNOSIS — Z8507 Personal history of malignant neoplasm of pancreas: Secondary | ICD-10-CM | POA: Diagnosis not present

## 2021-10-06 DIAGNOSIS — Z7901 Long term (current) use of anticoagulants: Secondary | ICD-10-CM

## 2021-10-06 DIAGNOSIS — I4510 Unspecified right bundle-branch block: Secondary | ICD-10-CM | POA: Diagnosis present

## 2021-10-06 DIAGNOSIS — M79661 Pain in right lower leg: Secondary | ICD-10-CM | POA: Diagnosis present

## 2021-10-06 DIAGNOSIS — H8101 Meniere's disease, right ear: Secondary | ICD-10-CM | POA: Diagnosis present

## 2021-10-06 DIAGNOSIS — Z6835 Body mass index (BMI) 35.0-35.9, adult: Secondary | ICD-10-CM | POA: Diagnosis not present

## 2021-10-06 DIAGNOSIS — Z888 Allergy status to other drugs, medicaments and biological substances status: Secondary | ICD-10-CM

## 2021-10-06 DIAGNOSIS — M1990 Unspecified osteoarthritis, unspecified site: Secondary | ICD-10-CM | POA: Diagnosis present

## 2021-10-06 DIAGNOSIS — I82402 Acute embolism and thrombosis of unspecified deep veins of left lower extremity: Secondary | ICD-10-CM

## 2021-10-06 DIAGNOSIS — Z833 Family history of diabetes mellitus: Secondary | ICD-10-CM | POA: Diagnosis not present

## 2021-10-06 DIAGNOSIS — K219 Gastro-esophageal reflux disease without esophagitis: Secondary | ICD-10-CM | POA: Diagnosis present

## 2021-10-06 DIAGNOSIS — G8929 Other chronic pain: Secondary | ICD-10-CM | POA: Diagnosis present

## 2021-10-06 DIAGNOSIS — D3A8 Other benign neuroendocrine tumors: Secondary | ICD-10-CM | POA: Diagnosis present

## 2021-10-06 DIAGNOSIS — Z7985 Long-term (current) use of injectable non-insulin antidiabetic drugs: Secondary | ICD-10-CM

## 2021-10-06 DIAGNOSIS — I82511 Chronic embolism and thrombosis of right femoral vein: Secondary | ICD-10-CM | POA: Diagnosis present

## 2021-10-06 DIAGNOSIS — M5440 Lumbago with sciatica, unspecified side: Secondary | ICD-10-CM

## 2021-10-06 DIAGNOSIS — I82431 Acute embolism and thrombosis of right popliteal vein: Secondary | ICD-10-CM | POA: Diagnosis not present

## 2021-10-06 DIAGNOSIS — I8290 Acute embolism and thrombosis of unspecified vein: Secondary | ICD-10-CM

## 2021-10-06 DIAGNOSIS — E669 Obesity, unspecified: Secondary | ICD-10-CM | POA: Diagnosis present

## 2021-10-06 DIAGNOSIS — Z79899 Other long term (current) drug therapy: Secondary | ICD-10-CM

## 2021-10-06 DIAGNOSIS — I10 Essential (primary) hypertension: Secondary | ICD-10-CM | POA: Diagnosis present

## 2021-10-06 DIAGNOSIS — E1151 Type 2 diabetes mellitus with diabetic peripheral angiopathy without gangrene: Secondary | ICD-10-CM | POA: Diagnosis present

## 2021-10-06 DIAGNOSIS — M5136 Other intervertebral disc degeneration, lumbar region: Secondary | ICD-10-CM | POA: Diagnosis present

## 2021-10-06 DIAGNOSIS — Z90411 Acquired partial absence of pancreas: Secondary | ICD-10-CM

## 2021-10-06 DIAGNOSIS — Z9081 Acquired absence of spleen: Secondary | ICD-10-CM

## 2021-10-06 DIAGNOSIS — I82409 Acute embolism and thrombosis of unspecified deep veins of unspecified lower extremity: Secondary | ICD-10-CM | POA: Diagnosis present

## 2021-10-06 DIAGNOSIS — Z7982 Long term (current) use of aspirin: Secondary | ICD-10-CM

## 2021-10-06 DIAGNOSIS — Z7984 Long term (current) use of oral hypoglycemic drugs: Secondary | ICD-10-CM

## 2021-10-06 DIAGNOSIS — Z808 Family history of malignant neoplasm of other organs or systems: Secondary | ICD-10-CM

## 2021-10-06 DIAGNOSIS — Z794 Long term (current) use of insulin: Secondary | ICD-10-CM | POA: Diagnosis not present

## 2021-10-06 LAB — CBC
HCT: 38.4 % — ABNORMAL LOW (ref 39.0–52.0)
Hemoglobin: 12.4 g/dL — ABNORMAL LOW (ref 13.0–17.0)
MCH: 29.2 pg (ref 26.0–34.0)
MCHC: 32.3 g/dL (ref 30.0–36.0)
MCV: 90.6 fL (ref 80.0–100.0)
Platelets: 502 10*3/uL — ABNORMAL HIGH (ref 150–400)
RBC: 4.24 MIL/uL (ref 4.22–5.81)
RDW: 14.2 % (ref 11.5–15.5)
WBC: 6.8 10*3/uL (ref 4.0–10.5)
nRBC: 0.3 % — ABNORMAL HIGH (ref 0.0–0.2)

## 2021-10-06 LAB — BASIC METABOLIC PANEL
Anion gap: 8 (ref 5–15)
BUN: 18 mg/dL (ref 8–23)
CO2: 22 mmol/L (ref 22–32)
Calcium: 9.9 mg/dL (ref 8.9–10.3)
Chloride: 104 mmol/L (ref 98–111)
Creatinine, Ser: 1.23 mg/dL (ref 0.61–1.24)
GFR, Estimated: >60 mL/min (ref >60)
Glucose, Bld: 236 mg/dL — ABNORMAL HIGH (ref 70–99)
Potassium: 4.3 mmol/L (ref 3.5–5.1)
Sodium: 134 mmol/L — ABNORMAL LOW (ref 135–145)

## 2021-10-06 LAB — PROTIME-INR
INR: 1.3 — ABNORMAL HIGH (ref 0.8–1.2)
Prothrombin Time: 16.3 seconds — ABNORMAL HIGH (ref 11.4–15.2)

## 2021-10-06 LAB — APTT: aPTT: 35 seconds (ref 24–36)

## 2021-10-06 MED ORDER — ONDANSETRON HCL 4 MG/2ML IJ SOLN
4.0000 mg | Freq: Four times a day (QID) | INTRAMUSCULAR | Status: DC | PRN
  Administered 2021-10-09: 4 mg via INTRAVENOUS
  Filled 2021-10-06 (×2): qty 2

## 2021-10-06 MED ORDER — ONDANSETRON HCL 4 MG PO TABS: 4.0000 mg | ORAL_TABLET | Freq: Four times a day (QID) | ORAL | Status: DC | PRN

## 2021-10-06 MED ORDER — INSULIN ASPART 100 UNIT/ML IJ SOLN
0.0000 [IU] | INTRAMUSCULAR | Status: DC
  Administered 2021-10-07: 3 [IU] via SUBCUTANEOUS
  Administered 2021-10-07 – 2021-10-08 (×2): 1 [IU] via SUBCUTANEOUS
  Administered 2021-10-08: 3 [IU] via SUBCUTANEOUS
  Administered 2021-10-09: 5 [IU] via SUBCUTANEOUS
  Administered 2021-10-09: 2 [IU] via SUBCUTANEOUS
  Administered 2021-10-09 (×2): 3 [IU] via SUBCUTANEOUS
  Administered 2021-10-09 – 2021-10-10 (×3): 2 [IU] via SUBCUTANEOUS

## 2021-10-06 MED ORDER — IOHEXOL 350 MG/ML SOLN
125.0000 mL | Freq: Once | INTRAVENOUS | Status: AC | PRN
  Administered 2021-10-06: 125 mL via INTRAVENOUS

## 2021-10-06 MED ORDER — HEPARIN (PORCINE) 25000 UT/250ML-% IV SOLN
1900.0000 [IU]/h | INTRAVENOUS | Status: DC
  Administered 2021-10-06: 1700 [IU]/h via INTRAVENOUS
  Administered 2021-10-07: 1900 [IU]/h via INTRAVENOUS
  Administered 2021-10-07 – 2021-10-08 (×3): 2100 [IU]/h via INTRAVENOUS
  Administered 2021-10-09 (×2): 1900 [IU]/h via INTRAVENOUS
  Filled 2021-10-06 (×7): qty 250

## 2021-10-06 MED ORDER — ONDANSETRON HCL 4 MG/2ML IJ SOLN
4.0000 mg | Freq: Once | INTRAMUSCULAR | Status: AC
  Administered 2021-10-06: 4 mg via INTRAVENOUS
  Filled 2021-10-06: qty 2

## 2021-10-06 MED ORDER — TAMSULOSIN HCL 0.4 MG PO CAPS
0.4000 mg | ORAL_CAPSULE | Freq: Every day | ORAL | Status: DC
  Administered 2021-10-06 – 2021-10-09 (×4): 0.4 mg via ORAL
  Filled 2021-10-06 (×4): qty 1

## 2021-10-06 MED ORDER — ACETAMINOPHEN 650 MG RE SUPP: 650.0000 mg | Freq: Four times a day (QID) | RECTAL | Status: DC | PRN

## 2021-10-06 MED ORDER — HYDROMORPHONE HCL 1 MG/ML IJ SOLN
0.5000 mg | Freq: Once | INTRAMUSCULAR | Status: AC
  Administered 2021-10-06: 0.5 mg via INTRAVENOUS
  Filled 2021-10-06: qty 1

## 2021-10-06 MED ORDER — FENTANYL CITRATE PF 50 MCG/ML IJ SOSY
12.5000 ug | PREFILLED_SYRINGE | INTRAMUSCULAR | Status: DC | PRN
  Administered 2021-10-06 – 2021-10-09 (×17): 50 ug via INTRAVENOUS
  Filled 2021-10-06 (×18): qty 1

## 2021-10-06 MED ORDER — CARVEDILOL 25 MG PO TABS
25.0000 mg | ORAL_TABLET | Freq: Every day | ORAL | Status: DC
  Administered 2021-10-06 – 2021-10-09 (×4): 25 mg via ORAL
  Filled 2021-10-06 (×4): qty 1

## 2021-10-06 MED ORDER — HYDROMORPHONE HCL 1 MG/ML IJ SOLN
0.2500 mg | Freq: Once | INTRAMUSCULAR | Status: AC
  Administered 2021-10-06: 0.25 mg via INTRAVENOUS
  Filled 2021-10-06: qty 1

## 2021-10-06 MED ORDER — ACETAMINOPHEN 325 MG PO TABS: 650.0000 mg | ORAL_TABLET | Freq: Four times a day (QID) | ORAL | Status: DC | PRN

## 2021-10-06 NOTE — ED Provider Notes (Signed)
[name] EMERGENCY DEPARTMENT Provider Note   CSN: [id number] Arrival date & time: 10/06/21  1305     History  Chief Complaint  Patient presents with   Leg Pain    Derek [name] is a 76 y.o. male.  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  Per the patient's wife a year ago he had a blood clot in his right lower extremity.  Was placed on Eliquis which resolved it and was taken off.  Says that July 14 he started experiencing worsening swelling of the right lower extremity and was diagnosed again with a blood clot.  Says that they started him on his Eliquis again but he has had persistently worsening pain.  They did see Dr. [name] from hematology oncology yesterday who was concerned about his IVC filter potentially being clogged and has reflected a clot requiring thrombectomy and IV heparin so he referred them to the emergency department.  They went to [facility] but due to wait times were not seen.  Did have an ultrasound of his right lower extremity 2 days ago that did show extensive clot burden.  Denies any chest pain or shortness of breath at this time.  No recent surgeries or bleeding.   Leg Pain      Home Medications Prior to Admission medications   Medication Sig Start Date End Date Taking? Authorizing Provider  acetaminophen (TYLENOL) 500 MG tablet Take 500 mg by mouth every 6 (six) hours as needed for mild pain or headache.   Yes [provider]  apixaban (ELIQUIS) 5 MG TABS tablet Take 1 tablet (5 mg total) by mouth 2 (two) times daily. 09/27/21  Yes   aspirin EC 81 MG tablet Take 81 mg by mouth daily. Swallow whole.   Yes [provider]  carvedilol (COREG) 25 MG tablet Take 1 tablet by mouth once a day every evening 07/08/21  Yes    diphenhydramine-acetaminophen (TYLENOL PM) 25-500 MG TABS tablet Take 2 tablets by mouth at bedtime.   Yes [provider]  Dulaglutide (TRULICITY) 3 BW/4.6KZ SOPN Inject 0.5 mLs (3 mg total) into the skin every 7 days. Patient taking differently: Inject 3 mg into the skin every Monday. 08/16/21  Yes   empagliflozin (JARDIANCE) 25 MG TABS tablet Take 1 tablet (25 mg total) by mouth daily. 08/16/21  Yes   fentaNYL (DURAGESIC) 25 MCG/HR Place 1 patch onto the skin every 3 (three) days. 9/93/57  Yes Campbell Stall P, DO  furosemide (LASIX) 20 MG tablet Take 20 mg by mouth daily as needed for edema.   Yes [provider]  lansoprazole (PREVACID) 15 MG capsule Take 15 mg by mouth daily as needed (for breakthrough GERD).   Yes [provider]  lansoprazole (PREVACID) 30 MG capsule TAKE 1 CAPSULE BY MOUTH ONCE DAILY Patient taking differently: Take 30 mg by mouth at bedtime. 07/08/21  Yes   lidocaine (LIDODERM) 5 % Place 1 patch onto the skin daily. Remove & Discard patch within 12 hours or as directed by MD 12/01/20  Yes Cristal Deer, MD  losartan (COZAAR) 50 MG tablet Take 1 tablet by mouth once every evening 07/08/21  Yes   magnesium oxide (MAG-OX) 400 MG tablet Take 800 mg by mouth at bedtime.   Yes [provider]  OVER THE COUNTER MEDICATION  Apply 1 application  topically 2 (two) times daily as needed (pain). [place] Dream cream   Yes [provider]  oxybutynin (DITROPAN) 5 MG tablet Take 1 tablet (5 mg total) by mouth every 6 (six) to 8 (eight) hours as needed for urinary urgency/frequency/leakage. Patient taking differently: Take 5 mg by mouth at bedtime. 05/26/21  Yes   pravastatin (PRAVACHOL) 20 MG tablet Take 1 tablet by mouth twice a week Patient taking differently: Take 20 mg by mouth 2 (two) times a week. Monday, Friday 07/08/21  Yes   tamsulosin (FLOMAX) 0.4 MG CAPS capsule take 1 capsule by mouth at bedtime 09/19/21  Yes   triamcinolone cream  (KENALOG) 0.1 % Apply externally two times a day for 30 days. Patient taking differently: Apply 1 application  topically daily as needed (irritation). 10/07/20  Yes   insulin aspart (NOVOLOG) 100 UNIT/ML injection USE 100U/DAY SUBCUTANEOUS VIA PUMP 30 DAY(S) Patient not taking: Reported on 10/07/2021 12/23/19 11/26/21  Jacelyn Pi, MD  Iron-FA-B Cmp-C-Biot-Probiotic (FUSION PLUS) CAPS Take 1 capsule by mouth daily. Patient not taking: Reported on 10/07/2021 03/02/21   Celso Amy, NP  nystatin (MYCOSTATIN) 100000 UNIT/ML suspension Use 4-5 mL for Mouth/Throat as directed four times a day Patient not taking: Reported on 10/07/2021 07/08/21         Allergies    Ditropan [oxybutynin], Quinine, Vibra-tab [doxycycline], Voltaren [diclofenac], Zoloft [sertraline], Cymbalta [duloxetine hcl], Morphine and related, Oxycontin [oxycodone hcl], Tape, Voltaren [diclofenac sodium], Zohydro er [employer] bitartrate er], and Neurontin [gabapentin]    Review of Systems   Review of Systems  Physical Exam Updated Vital Signs BP (!) 111/92 (BP Location: Left Wrist)   Pulse 68   Temp 97.6 F (36.4 C) (Oral)   Resp 18   Ht 6' (1.829 m)   Wt 120 kg   SpO2 96%   BMI 35.88 kg/m  Physical Exam Vitals and nursing note reviewed.  Constitutional:      General: He is not in acute distress.    Appearance: He is well-developed.  HENT:     Head: Normocephalic and atraumatic.     Right Ear: External ear normal.     Left Ear: External ear normal.     Nose: Nose normal.  Eyes:     Extraocular Movements: Extraocular movements intact.     Conjunctiva/sclera: Conjunctivae normal.     Pupils: Pupils are equal, round, and reactive to light.  Cardiovascular:     Rate and Rhythm: Normal rate and regular rhythm.  Pulmonary:     Effort: Pulmonary effort is normal. No respiratory distress.  Abdominal:     General: There is no distension.     Palpations: Abdomen is soft.  Musculoskeletal:        General: No  swelling.     Cervical back: Normal range of motion and neck supple.     Right lower leg: Edema (3+) present.     Left lower leg: No edema.     Comments: Patient able to move all toes and plantar and dorsiflex both ankles with full strength.  Intact sensation to light touch of both feet.  Both feet appear warm and well perfused with cap refill less than 3 seconds in all 10 toes.  Skin:    General: Skin is warm and dry.     Capillary Refill: Capillary refill takes less than 2 seconds.  Neurological:     Mental Status: He is alert. Mental status is at baseline.  Psychiatric:  Mood and Affect: Mood normal.        Behavior: Behavior normal.     ED Results / Procedures / Treatments   Labs (all labs ordered are listed, but only abnormal results are displayed) Labs Reviewed  BASIC METABOLIC PANEL - Abnormal; Notable for the following components:      Result Value   Sodium 134 (*)    Glucose, Bld 236 (*)    All other components within normal limits  CBC - Abnormal; Notable for the following components:   Hemoglobin 12.4 (*)    HCT 38.4 (*)    Platelets 502 (*)    nRBC 0.3 (*)    All other components within normal limits  PROTIME-INR - Abnormal; Notable for the following components:   Prothrombin Time 16.3 (*)    INR 1.3 (*)    All other components within normal limits  CBC - Abnormal; Notable for the following components:   RBC 3.96 (*)    Hemoglobin 11.9 (*)    HCT 36.2 (*)    Platelets 438 (*)    All other components within normal limits  BASIC METABOLIC PANEL - Abnormal; Notable for the following components:   Sodium 134 (*)    Glucose, Bld 168 (*)    All other components within normal limits  HEMOGLOBIN A1C - Abnormal; Notable for the following components:   Hgb A1c MFr Bld 8.3 (*)    All other components within normal limits  APTT - Abnormal; Notable for the following components:   aPTT 56 (*)    All other components within normal limits  HEPARIN LEVEL  (UNFRACTIONATED) - Abnormal; Notable for the following components:   Heparin Unfractionated >1.10 (*)    All other components within normal limits  GLUCOSE, CAPILLARY - Abnormal; Notable for the following components:   Glucose-Capillary 211 (*)    All other components within normal limits  APTT - Abnormal; Notable for the following components:   aPTT 61 (*)    All other components within normal limits  GLUCOSE, CAPILLARY - Abnormal; Notable for the following components:   Glucose-Capillary 141 (*)    All other components within normal limits  GLUCOSE, CAPILLARY - Abnormal; Notable for the following components:   Glucose-Capillary 135 (*)    All other components within normal limits  GLUCOSE, CAPILLARY - Abnormal; Notable for the following components:   Glucose-Capillary 118 (*)    All other components within normal limits  APTT  HEPARIN LEVEL (UNFRACTIONATED)  APTT    EKG None  Radiology CT VENOGRAM ABD/PELVIS/LOWER EXT BILAT  Result Date: 10/06/2021 CLINICAL DATA:  Right leg pain with known occlusive DVT. EXAM: CT VENOGRAM ABDOMEN AND PELVIS AND LOWER EXTREMITY BILATERAL TECHNIQUE: CT imaging of the abdomen pelvis and bilateral lower extremities performed following administration of intravenous contrast. RADIATION DOSE REDUCTION: This exam was performed according to the departmental dose-optimization program which includes automated exposure control, adjustment of the mA and/or kV according to patient size and/or use of iterative reconstruction technique. CONTRAST:  125m OMNIPAQUE IOHEXOL 350 MG/ML SOLN COMPARISON:  Right lower extremity DVT ultrasound 10/04/2021 FINDINGS: Lower chest: Large sliding hiatal hernia. Mild dependent atelectasis. No focal opacity. Hepatobiliary: No focal liver abnormality is seen. No gallstones, gallbladder wall thickening, or biliary dilatation. Pancreas: Surgical changes of prior distal pancreatectomy. The remaining pancreatic tissue demonstrates no  evidence of mass or inflammatory changes. Spleen: Surgically absent. Adrenals/Urinary Tract: Unremarkable adrenal glands. No hydronephrosis, nephrolithiasis or enhancing renal mass. No evidence of hydronephrosis. The ureters  are normal. Diffuse mild thickening of the bladder wall with a right-sided Peri ureteral diverticulum. Stomach/Bowel: No focal bowel wall thickening or evidence of obstruction. Vascular/Lymphatic: Scattered atherosclerotic plaque along the abdominal aorta. Mild tortuosity. No evidence of aneurysm or dissection. Venous structures are well evaluated. The right common femoral vein is patent as is the saphenofemoral junction. DVT begins proximally in the right femoral vein. Reproductive: Surgical changes of prior TURP. Other: No abdominal wall hernia or abnormality. No abdominopelvic ascites. Musculoskeletal: No acute fracture or aggressive appearing lytic or blastic osseous lesion. Multilevel degenerative disc disease. IVC: A Bard Denali IVC filter is present within the infrarenal IVC. There is a small amount of thrombus within the nose cone of the catheter. The IVC remains patent. Portal and mesenteric veins: Widely patent and unremarkable. Bilateral iliac veins: Both iliac veins are patent without evidence of venous compression or obstruction. Right lower extremity: The right common femoral vein is widely patent. The saphenofemoral junction is patent. No evidence of thrombus in the profunda femoral vein. Thrombus begins in the proximal femoral vein in the upper thigh and extends throughout the femoral vein and into the popliteal vein. Evaluation of the popliteal vein is slightly limited due to knee arthroplasty prosthesis. The calf veins appear largely patent. Left lower extremity: No evidence of deep venous thrombosis in the left lower extremity. IMPRESSION: 1. Positive for DVT within the right femoral vein beginning at the vein origin in the upper thigh and extending through the popliteal vein.  2. No evidence of extension into the iliac venous system or IVC. 3. No evidence of iliac vein compression physiology. 4. Bard Denali IVC filter present in the infrarenal IVC with a small amount of nonocclusive thrombus in the nose cone. 5. Moderately large sliding hiatal hernia. 6.  Aortic Atherosclerosis (ICD10-I70.0). 7. 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 8. Additional ancillary findings as above. Electronically Signed   By: Jacqulynn Cadet M.D.   On: 10/06/2021 14:59    Procedures Procedures   Medications Ordered in ED Medications  heparin ADULT infusion 100 units/mL (25000 units/221m) (2,100 Units/hr Intravenous Rate/Dose Change 10/07/21 1523)  acetaminophen (TYLENOL) tablet 650 mg (has no administration in time range)    Or  acetaminophen (TYLENOL) suppository 650 mg (has no administration in time range)  ondansetron (ZOFRAN) tablet 4 mg (has no administration in time range)    Or  ondansetron (ZOFRAN) injection 4 mg (has no administration in time range)  fentaNYL (SUBLIMAZE) injection 12.5-50 mcg (50 mcg Intravenous Given 10/07/21 1522)  insulin aspart (novoLOG) injection 0-9 Units ( Subcutaneous Not Given 10/07/21 1139)  tamsulosin (FLOMAX) capsule 0.4 mg (0.4 mg Oral Given 10/06/21 2243)  carvedilol (COREG) tablet 25 mg (25 mg Oral Given 10/06/21 2243)  lidocaine (LIDODERM) 5 % 1 patch (1 patch Transdermal Patch Applied 10/07/21 1305)  iohexol (OMNIPAQUE) 350 MG/ML injection 125 mL (125 mLs Intravenous Contrast Given 10/06/21 1423)  HYDROmorphone (DILAUDID) injection 0.25 mg (0.25 mg Intravenous Given 10/06/21 1650)  ondansetron (ZOFRAN) injection 4 mg (4 mg Intravenous Given 10/06/21 1650)  HYDROmorphone (DILAUDID) injection 0.5 mg (0.5 mg Intravenous Given 10/06/21 2037)    ED Course/ Medical Decision Making/ A&P Clinical Course as of 10/07/21 1723  Thu Oct 06, 2021  1340 Spoke with vascular surgeon Dr CCarlis Abbott No evidence of common femoral vein involvement  on recent dvt so they feel that proximal clot requiring venous imaging of the abdomen or IVC filter is not required.  They state that since the clot is  chronic he is not a candidate for thrombectomy. [RP]  6578 Called Heme onc who states Dr [name] can be reached at [phone].  [RP]  1403 Spoke with Dr Maryelizabeth Kaufmann from IR who was contacted by Dr [name] says that CTV of the abdomen pelvis we beneficial. [RP]  1538 Dr Maryelizabeth Kaufmann from IR has reviewed the scans.  Feels that thrombectomy is an option for the patient.  Discussed with hematology who also feels that patient would benefit from admission to be transitioned to a different oral anticoagulant such as Coumadin.  We will start the patient on a heparin drip at this time. [RP]  1324 Spoke to John from pharmacy who will place the heparin order shortly. [RP]    Clinical Course User Index [RP] Fransico Meadow, MD                           Medical Decision Making Amount and/or Complexity of Data Reviewed Labs: ordered. Radiology: ordered.  Risk Prescription drug management. Decision regarding hospitalization.   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   Initial DDx: Recurrent DVT, phlegmasia cerulea dolens  Plan:  Labs Vascular surgery consult Hematology consult  ED Summary:  Discussed the patient initially with vascular surgery who did not feel that the patient was suitable for an intervention on their behalf.  Also discussed him with hematology who had reviewed the case with interventional radiology who felt that IR may be able to provide a thrombectomy.  MRV was obtained per IR recommendations for interventional planning.  After discussing the patient's treatment failure and the need to go on possibly a different anticoagulant he was started on heparin  and then was admitted to the hospital for further management of his DVT and possible thrombectomy tomorrow.  Patient remained neurovascularly intact while in the emergency department did not have any signs of ischemia.  Consults: -IR consult -Vascular surgery consult -Hematology consult    Additional history obtained from spouse Records reviewed Care Everywhere/External Records  Final Clinical Impression(s) / ED Diagnoses Final diagnoses:  Chronic deep vein thrombosis (DVT) of femoral vein of right lower extremity (HCC)  Anticoagulant long-term use    Rx / DC Orders ED Discharge Orders     None         Fransico Meadow, MD 10/07/21 1723

## 2021-10-06 NOTE — Assessment & Plan Note (Signed)
RLE DVT with worsening swelling, failed eliquis at this point. 1. Heparin gtt 2. Likely need ultimately to be on coumadin per heme/onc office note from earlier today 3. IR to see in AM for thrombectomy 1. NPO after MN 4. Tele monitor 5. Small amount of clot in his IVC but filter remains patent on CT today. 6. Clinically: pt doesn't seem to have phlegmasia cerulea dolens at this point: 1. Easily doplerable (and even palpable) DP pulse in foot 2. No skin discoloration in that leg, no mottling, etc 7. Fentanyl PRN pain control

## 2021-10-06 NOTE — Telephone Encounter (Signed)
Per 10/05/21 los - I will schedule him for follow-up.  He will be admitted to the hospital today.

## 2021-10-06 NOTE — Telephone Encounter (Signed)
Per 10/06/21 los - I will schedule him for follow-up.  He will be admitted to the hospital today.

## 2021-10-06 NOTE — Assessment & Plan Note (Signed)
Continue coreg (looks like he only takes this once a day?) Med rec pending.

## 2021-10-06 NOTE — ED Notes (Signed)
[facility] at bedside. Pt stable for transport. Pt/family updated on plan of care. All questions/concerns addressed at this time.

## 2021-10-06 NOTE — H&P (Signed)
History and Physical    Patient: Derek [name] YQM:[id number] DOB: May 19, 1945 DOA: 10/06/2021 DOS: the patient was seen and examined on 10/06/2021 PCP: [name], MD  Patient coming from: Home  Chief Complaint:  Chief Complaint  Patient presents with   Leg Pain   HPI: Derek [name] is a 76 y.o. 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  Pt was mistakenly taken off of eliquis a couple of months ago.  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  Put back on eliquis.  Unfortunately despite being back on eliquis, swelling + pain in RLE has worsened.  Has intermittent bouts of discoloration in that extremity, though thankfully none at the moment.  Does have 7/10 pain in that extremity currently.  Saw Heme/onc in office earlier this AM who sent him in to ED at med center for thrombectomy by IR, heparin drip with plan to transition to coumadin.   Review of Systems: As mentioned in the history of present illness. All other systems reviewed and are negative. 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" no pulmonary md, sees dr [name]   Factor V Leiden mutation ([place])    "never had blood clots"   GERD (gastroesophageal reflux disease)    Hiatal hernia    History of kidney stones    multiple kidney stones-not a problem at present   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, pT1 pNX) no further treatment   History of panic  attacks    History of traumatic head injury    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   Past Surgical History:  Procedure Laterality Date   ANAL FISTULECTOMY N/A 04/01/2013   Procedure: EXAM UNDER ANESTHESIA WITH ANAL FISSURectomy, sphincterotomy and internal hemorrhoidectomy;  Surgeon: Harl Bowie, MD;  Location: [place];  Service: General;  Laterality: N/A;   APPLICATION OF WOUND VAC N/A 05/16/2015   Procedure: APPLICATION OF WOUND VAC;  Surgeon: Armandina Gemma, MD;  Location: [facility];  Service: General;  Laterality:  N/A;   APPLICATION OF WOUND VAC N/A 05/20/2015   Procedure: EXHANGE OF ABDOMINAL  WOUND VAC DRESSING;  Surgeon: Armandina Gemma, MD;  Location: [facility];  Service: General;  Laterality: N/A;   COLONOSCOPY     CYSTOSCOPY N/A 05/23/2021   Procedure: Consuela Mimes;  Surgeon: Franchot Gallo, MD;  Location: [facility];  Service: Urology;  Laterality: N/A;   CYSTOSCOPY WITH INSERTION OF UROLIFT N/A 10/12/2016   Procedure: CYSTOSCOPY WITH INSERTION OF UROLIFT;  Surgeon: Franchot Gallo, MD;  Location: [facility];  Service: Urology;  Laterality: N/A;   EUS N/A 12/31/2014   Procedure: UPPER ENDOSCOPIC ULTRASOUND (EUS) LINEAR;  Surgeon: Milus Banister, MD;  Location[facility];   Service: Endoscopy;  Laterality: N/A;   HARDWARE REMOVAL Left 2013   left ankle   HEMORRHOIDECTOMY WITH HEMORRHOID BANDING     INCISION AND DRAINAGE ABSCESS N/A 05/14/2015   Procedure: DRAINAGE OF INFECTED PANCREATIC PSEUDOCYST;  Surgeon: Armandina Gemma, MD;  Location: [facility];  Service: General;  Laterality: N/A;   INCISION AND DRAINAGE OF WOUND N/A 05/16/2015   Procedure: IRRIGATION AND DEBRIDEMENT WOUND;  Surgeon: Armandina Gemma, MD;  Location: [facility];  Service: General;  Laterality: N/A;   INCISIONAL HERNIA REPAIR N/A 11/12/2015   Procedure: REPAIR INCISIONAL HERNIA WITH MESH;  Surgeon: Armandina Gemma, MD;  Location: [place];  Service: General;  Laterality: N/A;   INGUINAL HERNIA REPAIR Right 1974;  [place] N/A 11/12/2015   Procedure: INSERTION OF MESH;  Surgeon: Armandina Gemma, MD;  Location: [place];  Service: General;  Laterality: N/A;   IR GENERIC HISTORICAL  04/20/2015   IR RADIOLOGIST EVAL & MGMT 04/20/2015 [facility] INTERV RAD   IR IVC FILTER PLMT / S&I Burke Keels GUID/MOD SED  11/25/2020   IR RADIOLOGY PERIPHERAL GUIDED IV START  11/25/2020   IR US GUIDE VASC ACCESS RIGHT  11/25/2020   KNEE ARTHROSCOPY Bilateral right 08-08-2000;  left 11-23-2000   meniscal repair and chondroplasty's   KNEE ARTHROSCOPY  03/01/2012   Procedure: ARTHROSCOPY KNEE;  Surgeon: Gearlean Alf, MD;  Location: [facility];  Service: Orthopedics;  Laterality: Left;  WITH SYNOVECTOMY    LAPAROTOMY N/A 05/14/2015   Procedure: EXPLORATORY LAPAROTOMY;  Surgeon: Armandina Gemma, MD;  Location[facility];  Service: General;  Laterality: N/A;   MASS EXCISION N/A 12/24/2019   Procedure: EXCISION LIP CARCINOMA;  Surgeon: Leta Baptist, MD;  Location: [place];  Service: ENT;  Laterality: N/A;   ORIF LEFT ANKLE FX  08/20/2009   distal tib-fib and malleolus   ROTATOR CUFF REPAIR Bilateral right 1994; left 1993, left 1993   TOTAL KNEE ARTHROPLASTY Right 06/02/2013   Procedure: RIGHT TOTAL KNEE ARTHROPLASTY;  Surgeon: Gearlean Alf,  MD;  Location[facility];  Service: Orthopedics;  Laterality: Right;   TOTAL KNEE ARTHROPLASTY Left 01-31-2008   dr Wynelle Link   TOTAL KNEE REVISION Right 01/23/2018   Procedure: right knee polyethylene revision;  Surgeon: Gaynelle Arabian, MD;  Location[facility];  Service: Orthopedics;  Laterality: Right;  61mn   TRANSURETHRAL RESECTION OF BLADDER TUMOR N/A 05/23/2021   Procedure: TRANSURETHRAL BIOPSY OF BLADDER;  Surgeon: DFranchot Gallo MD;  [facility]  Service: Urology;  Laterality: N/A;   TRANSURETHRAL RESECTION OF PROSTATE N/A 04/21/2019   Procedure: TRANSURETHRAL RESECTION OF THE PROSTATE (TURP);  Surgeon: DFranchot Gallo MD;  [facility]  Service: Urology;  Laterality: N/A;   UMBILICAL HERNIA REPAIR  08-11-1999   dr blackman   UPPER GASTROINTESTINAL ENDOSCOPY  sept 2016   WOUND EXPLORATION N/A 09/21/2016   Procedure: WOUND EXPLORATION AND DEBRIDEMENT ABDOMINAL WALL;  Surgeon: Armandina Gemma, MD;  [facility];  Service: General;  Laterality: N/A;   Social History:  reports that he quit smoking about 46 years ago. His smoking use included cigarettes. He has a 68.00 pack-year smoking history. He has never used smokeless tobacco. He reports that he does not drink alcohol and does not use drugs.  Allergies  Allergen Reactions   Diclofenac Swelling and Other (See Comments)    Elevated liver enzymes and lips swell    Doxycycline Other (See Comments)    Severe nose bleeds    Doxycycline Hyclate Other (See Comments)    Epistaxis   Duloxetine Other (See Comments)    Night sweats     Hydrocodone Swelling and Other (See Comments)    Facial swelling- can tolerate "immediate-release" Hydrocodone, though   Oxybutynin Swelling and Other (See Comments)    Swelling    Quinine Other (See Comments)    Platelets dropped- consumptive coagulopathy     Sertraline Other (See Comments)    Can't sleep or urinate   Duloxetine Hcl Other (See  Comments)    Night sweats   Morphine And Related Nausea And Vomiting, Swelling and Other (See Comments)    Facial swelling and lips swell, but tolerates immediate-release oxycodone as well as hydrocodone    Oxycontin [Oxycodone Hcl] Swelling and Other (See Comments)    Lips swell, but tolerates immediate-release oxycodone as well as hydrocodone   Sertraline Hcl Other (See Comments)    Urinary problems and insomnia   Tape Other (See Comments)    SKIN IS FRAGILE- CERTAIN BANDAGES TEAR OFF THE SKIN   Voltaren [Diclofenac Sodium] Other (See Comments)    Elevated liver enzymes   Gabapentin Other (See Comments)         Family History  Problem Relation Age of Onset   Bone cancer Father        Jaw   Factor V Leiden deficiency Daughter    Diabetes Maternal Grandmother    Heart disease Paternal Uncle    Colon cancer Neg Hx    Esophageal cancer Neg Hx    Stomach cancer Neg Hx    Rectal cancer Neg Hx     Prior to Admission medications   Medication Sig Start Date End Date Taking? Authorizing Provider  apixaban (ELIQUIS) 5 MG TABS tablet Take 1 tablet (5 mg total) by mouth 2 (two) times daily. 09/27/21     carvedilol (COREG) 25 MG tablet Take 1 tablet by mouth once a day every evening 07/08/21     Dulaglutide (TRULICITY) 3 ZH/0.8MV SOPN Inject 0.5 mLs (3 mg total) into the skin every 7 days. 08/16/21     empagliflozin (JARDIANCE) 25 MG TABS tablet Take 1 tablet (25 mg total) by mouth daily. 08/16/21     fentaNYL (DURAGESIC) 25 MCG/HR Place 1 patch onto the skin every 3 (three) days. 7/84/69   Campbell Stall P, DO  furosemide (LASIX) 20 MG tablet Take 20 mg by mouth daily as needed for edema.    [provider]  insulin aspart (NOVOLOG) 100 UNIT/ML injection USE 100U/DAY SUBCUTANEOUS VIA PUMP 30 DAY(S) Patient taking differently: Inject 5-30 Units into the skin See admin instructions. Inject 5-30 units into the skin three times a day before meals, PER SLIDING SCALE 12/23/19 05/23/21  Jacelyn Pi, MD  Iron-FA-B  Cmp-C-Biot-Probiotic (FUSION PLUS) CAPS Take 1 capsule by mouth daily. 03/02/21   Celso Amy, NP  lansoprazole (PREVACID) 30 MG capsule TAKE 1 CAPSULE BY MOUTH ONCE DAILY 07/08/21     lidocaine (LIDODERM) 5 % Place 1 patch onto the skin daily. Remove & Discard patch within 12 hours or as directed by MD 12/01/20   Cristal Deer, MD  losartan (COZAAR) 50 MG tablet Take 1 tablet by mouth once every evening 07/08/21     magnesium oxide (MAG-OX) 400 MG tablet Take 800 mg by mouth at bedtime. Triple complex    [provider]  nystatin (MYCOSTATIN) 100000 UNIT/ML suspension Use 4-5 mL for Mouth/Throat as directed four times a day 07/08/21     oxybutynin (DITROPAN) 5 MG tablet Take 1 tablet (5 mg total) by mouth every 6 (six) to 8 (eight) hours as needed for urinary urgency/frequency/leakage. 05/26/21     pravastatin (PRAVACHOL) 20 MG tablet Take 1 tablet by mouth twice a week 07/08/21     tamsulosin (FLOMAX) 0.4 MG CAPS capsule take 1 capsule by mouth at bedtime 09/19/21     triamcinolone cream (KENALOG) 0.1 % Apply externally two times a day for 30 days. 10/07/20     UNABLE TO FIND Apply 1 Tube topically as needed. Med Name: [place]    [provider]    Physical Exam: Vitals:   10/06/21 1944 10/06/21 1945 10/06/21 2030 10/06/21 2128  BP: 115/73 115/73 (!) 145/85 133/85  Pulse: 67 68 67 72  Resp: '19 11 17 17  '$ Temp: 97.6 F (36.4 C)   97.6 F (36.4 C)  TempSrc: Oral   Oral  SpO2: 98% 99% 98% 96%  Weight:    120.5 kg  Height:    6' (1.829 m)   Constitutional: NAD, calm, comfortable Eyes: PERRL, lids and conjunctivae normal ENMT: Mucous membranes are moist. Posterior pharynx clear of any exudate or lesions.Normal dentition.  Neck: normal, supple, no masses, no thyromegaly Respiratory: clear to auscultation bilaterally, no wheezing, no crackles. Normal respiratory effort. No accessory muscle use.  Cardiovascular: Regular rate and rhythm,  no murmurs / rubs / gallops. No extremity edema. 2+ pedal pulses. No carotid bruits.  Abdomen: no tenderness, no masses palpated. No hepatosplenomegaly. Bowel sounds positive.  Musculoskeletal: no clubbing / cyanosis. No joint deformity upper and lower extremities. Good ROM, no contractures. Normal muscle tone.  Skin:  Does have significant significant RLE swelling.  Compartments somewhat tense but No discoloration of leg/foot.  No mottling.  And has easily dopplerable / palpable DP pulse in that foot. Neurologic: CN 2-12 grossly intact. Sensation intact, DTR normal. Strength 5/5 in all 4.  Psychiatric: Normal judgment and insight. Alert and oriented x 3. Normal mood.   Data Reviewed:    CT Venogram of LE: IMPRESSION: 1. Positive for DVT within the right femoral vein beginning at the vein origin in the upper thigh and extending through the popliteal vein. 2. No evidence of extension into the iliac venous system or IVC. 3. No evidence of iliac vein compression physiology. 4. Bard Denali IVC filter present in the infrarenal IVC with a small amount of nonocclusive thrombus in the nose cone. 5. Moderately large sliding hiatal hernia. 6.  Aortic Atherosclerosis (ICD10-I70.0). 7. 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 8. Additional ancillary findings as above.  US venous: IMPRESSION: Occlusive deep venous thrombosis is noted in the right femoral, popliteal, posterior tibial and peroneal veins. These results will be called to the ordering  clinician or representative by the Radiologist Assistant, and communication documented in the PACS or zVision Dashboard.   Assessment and Plan: * DVT (deep venous thrombosis) (HCC) RLE DVT with worsening swelling, failed eliquis at this point. Heparin gtt Likely need ultimately to be on coumadin per heme/onc office note from earlier today IR to see in AM for thrombectomy NPO after MN Tele monitor Small amount of clot in  his IVC but filter remains patent on CT today. Clinically: pt doesn't seem to have phlegmasia cerulea dolens at this point: Easily doplerable (and even palpable) DP pulse in foot No skin discoloration in that leg, no mottling, etc Fentanyl PRN pain control  Chronic back pain No longer on long term opoid therapy (pt had very bad experience with dilaudid, almost committed suicide, etc).  Benign essential HTN Continue coreg (looks like he only takes this once a day?) Med rec pending.  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 Following with hemeonc DVT this time around believed to be secondary to stopping eliquis for 2 months prior to onset.  And eliquis not really working for patient.      Advance Care Planning:   Code Status: Full Code  Consults: Dr. Dwaine Gale (IR), they will se pt in AM  Family Communication: Wife at bedside  Severity of Illness: The appropriate patient status for this patient is INPATIENT. Inpatient status is judged to be reasonable and necessary in order to provide the required intensity of service to ensure the patient's safety. The patient's presenting symptoms, physical exam findings, and initial radiographic and laboratory data in the context of their chronic comorbidities is felt to place them at high risk for further clinical deterioration. Furthermore, it is not anticipated that the patient will be medically stable for discharge from the hospital within 2 midnights of admission.   * I certify that at the point of admission it is my clinical judgment that the patient will require inpatient hospital care spanning beyond 2 midnights from the point of admission due to high intensity of service, high risk for further deterioration and high frequency of surveillance required.*  Author: Etta Quill., DO 10/06/2021 10:32 PM  For on call review [email or web address].

## 2021-10-06 NOTE — ED Notes (Signed)
Elevated RLE per MD order.

## 2021-10-06 NOTE — ED Notes (Signed)
ED TO INPATIENT HANDOFF REPORT  ED Nurse Name and Phone #: Lucina Mellow [phone]  S Name/Age/Gender Derek [name] 76 y.o. male Room/Bed: MH05/MH05  Code Status   Code Status: Prior  Home/SNF/Other Home Patient oriented to: self, place, time, and situation Is this baseline? Yes   Triage Complete: Triage complete  Chief Complaint DVT (deep venous thrombosis) ([place]) [I82.409]  Triage Note Patient presents to ED via POV from home. Here with right leg pain. Known occlusive DVT. Per chart review foot has a bluish hue and pulses were difficult to palpate in clinic.    Allergies Allergies  Allergen Reactions   Diclofenac Swelling and Other (See Comments)    Elevated liver enzymes and lips swell    Doxycycline Other (See Comments)    Severe nose bleeds    Doxycycline Hyclate Other (See Comments)    Epistaxis   Duloxetine Other (See Comments)    Night sweats     Hydrocodone Swelling and Other (See Comments)    Facial swelling- can tolerate "immediate-release" Hydrocodone, though   Oxybutynin Swelling and Other (See Comments)    Swelling    Quinine Other (See Comments)    Platelets dropped- consumptive coagulopathy     Sertraline Other (See Comments)    Can't sleep or urinate   Duloxetine Hcl Other (See Comments)    Night sweats   Morphine And Related Nausea And Vomiting, Swelling and Other (See Comments)    Facial swelling and lips swell, but tolerates immediate-release oxycodone as well as hydrocodone    Oxycontin [Oxycodone Hcl] Swelling and Other (See Comments)    Lips swell, but tolerates immediate-release oxycodone as well as hydrocodone   Sertraline Hcl Other (See Comments)    Urinary problems and insomnia   Tape Other (See Comments)    SKIN IS FRAGILE- CERTAIN BANDAGES TEAR OFF THE SKIN   Voltaren [Diclofenac Sodium] Other (See Comments)    Elevated liver enzymes   Gabapentin Other (See Comments)         Level of Care/Admitting Diagnosis ED  Disposition     ED Disposition  Admit   Condition  --   Comment  Hospital Area: [facility] [[id number]]  Level of Care: Telemetry Medical [104]  May admit patient to [facility] or [facility] if equivalent level of care is available:: No  Interfacility transfer: Yes  Covid Evaluation: Confirmed COVID Negative  Diagnosis: DVT (deep venous thrombosis) [facility]) [[id number]]  Admitting Physician: Catheryn Bacon  Attending Physician: Florencia Reasons [[id number]]  Certification:: I certify this patient will need inpatient services for at least 2 midnights  Estimated Length of Stay: 3          B Medical/Surgery History 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" no pulmonary md, sees dr [name]   Factor V Leiden mutation ([place])    "never had blood clots"   GERD (gastroesophageal reflux disease)    Hiatal hernia    History of kidney stones    multiple kidney stones-not a problem at present   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, pT1 pNX) no further treatment   History of panic attacks    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   Past Surgical History:  Procedure Laterality Date   ANAL FISTULECTOMY N/A 04/01/2013   Procedure: EXAM UNDER ANESTHESIA WITH ANAL FISSURectomy, sphincterotomy and internal hemorrhoidectomy;  Surgeon: Harl Bowie, MD;  Location: [facility];  Service: General;  Laterality: N/A;   APPLICATION OF WOUND VAC N/A 05/16/2015   Procedure: APPLICATION OF WOUND VAC;  Surgeon: Armandina Gemma, MD;  [facility];  Service: General;  Laterality: N/A;   APPLICATION OF WOUND VAC N/A 05/20/2015   Procedure: EXHANGE OF ABDOMINAL  WOUND VAC DRESSING;  Surgeon: Armandina Gemma, MD;  [facility];  Service: General;  Laterality: N/A;   COLONOSCOPY     CYSTOSCOPY N/A 05/23/2021   Procedure: Consuela Mimes;  Surgeon: Franchot Gallo, MD;  Location: [facility];  Service: Urology;  Laterality: N/A;   CYSTOSCOPY WITH INSERTION OF UROLIFT N/A 10/12/2016   Procedure: CYSTOSCOPY WITH INSERTION OF UROLIFT;  Surgeon: Franchot Gallo, MD;  Location: [facility];  Service: Urology;  Laterality: N/A;   EUS N/A 12/31/2014   Procedure: UPPER ENDOSCOPIC ULTRASOUND (EUS) LINEAR;  Surgeon: Milus Banister, MD;  [facility];  Service: Endoscopy;  Laterality: N/A;   HARDWARE REMOVAL Left 2013   left ankle   HEMORRHOIDECTOMY WITH HEMORRHOID BANDING     INCISION AND DRAINAGE ABSCESS N/A 05/14/2015   Procedure: DRAINAGE OF INFECTED PANCREATIC PSEUDOCYST;  Surgeon: Armandina Gemma, MD;  [facility];  Service: General;  Laterality: N/A;   INCISION AND DRAINAGE OF WOUND N/A 05/16/2015   Procedure: IRRIGATION AND DEBRIDEMENT WOUND;  Surgeon: Armandina Gemma, MD;  [facility];  Service:  General;  Laterality: N/A;   INCISIONAL HERNIA REPAIR N/A 11/12/2015   Procedure: REPAIR INCISIONAL HERNIA WITH MESH;  Surgeon: Armandina Gemma, MD;  Location: [facility];  Service: General;  Laterality: N/A;   INGUINAL HERNIA REPAIR Right 1974;  [place] N/A 11/12/2015   Procedure: INSERTION OF MESH;  Surgeon: Armandina Gemma, MD;  Location: [facility];  Service: General;  Laterality: N/A;   IR GENERIC HISTORICAL  04/20/2015   IR RADIOLOGIST EVAL & MGMT 04/20/2015 [facility] INTERV RAD   IR IVC FILTER PLMT / S&I Burke Keels GUID/MOD SED  11/25/2020   IR RADIOLOGY PERIPHERAL GUIDED IV START  11/25/2020   IR US GUIDE VASC ACCESS RIGHT  11/25/2020   KNEE ARTHROSCOPY Bilateral  right 08-08-2000;  left 11-23-2000   meniscal repair and chondroplasty's   KNEE ARTHROSCOPY  03/01/2012   Procedure: ARTHROSCOPY KNEE;  Surgeon: Gearlean Alf, MD;  Location: [facility];  Service: Orthopedics;  Laterality: Left;  WITH SYNOVECTOMY    LAPAROTOMY N/A 05/14/2015   Procedure: EXPLORATORY LAPAROTOMY;  Surgeon: Armandina Gemma, MD;  [facility];  Service: General;  Laterality: N/A;   MASS EXCISION N/A 12/24/2019   Procedure: EXCISION LIP CARCINOMA;  Surgeon: Leta Baptist, MD;  Location: [facility];  Service: ENT;  Laterality: N/A;   ORIF LEFT ANKLE FX  08/20/2009   distal tib-fib and malleolus   ROTATOR CUFF REPAIR Bilateral right 1994; left 1993, left 1993   TOTAL KNEE ARTHROPLASTY Right 06/02/2013   Procedure: RIGHT TOTAL KNEE ARTHROPLASTY;  Surgeon: Gearlean Alf, MD;  [facility];  Service: Orthopedics;  Laterality: Right;   TOTAL KNEE ARTHROPLASTY Left 01-31-2008   dr Wynelle Link   TOTAL KNEE REVISION Right 01/23/2018   Procedure: right knee polyethylene revision;  Surgeon: Gaynelle Arabian, MD;  [facility];  Service: Orthopedics;  Laterality: Right;  82mn   TRANSURETHRAL RESECTION OF BLADDER TUMOR N/A 05/23/2021   Procedure: TRANSURETHRAL BIOPSY OF BLADDER;  Surgeon: DFranchot Gallo MD;  [facility]  Service: Urology;  Laterality: N/A;   TRANSURETHRAL RESECTION OF PROSTATE N/A 04/21/2019   Procedure: TRANSURETHRAL RESECTION OF THE PROSTATE (TURP);  Surgeon: DFranchot Gallo MD;  [facility]  Service: Urology;  Laterality: N/A;   UMBILICAL HERNIA REPAIR  08-11-1999   dr bNinfa Linden  UPPER GASTROINTESTINAL ENDOSCOPY  sept 2016   WOUND EXPLORATION N/A 09/21/2016   Procedure: WOUND EXPLORATION AND DEBRIDEMENT ABDOMINAL WALL;  Surgeon: GArmandina Gemma MD;  [facility];  Service: General;  Laterality: N/A;     A IV Location/Drains/Wounds Patient Lines/Drains/Airways Status     Active Line/Drains/Airways     Name Placement date Placement time Site Days   Peripheral IV 10/06/21 20 G 1" Right Antecubital 10/06/21  1359  Antecubital  less than 1   Incision (Closed) 05/23/21 Penis Other (Comment) 05/23/21  0825  -- 136            Intake/Output Last 24 hours No intake or output data in the 24 hours ending 10/06/21 1933  Labs/Imaging Results for orders placed or performed during the hospital encounter of 10/06/21 (from the past 48 hour(s))  Basic metabolic panel     Status: Abnormal   Collection Time: 10/06/21  1:30 PM  Result Value Ref Range   Sodium 134 (L) 135 - 145 mmol/L   Potassium 4.3 3.5 - 5.1 mmol/L   Chloride 104 98 - 111 mmol/L   CO2 22 22 - 32 mmol/L   Glucose, Bld 236 (H) 70 - 99 mg/dL    Comment: Glucose reference range applies only to samples taken after fasting for at least 8 hours.   BUN 18 8 - 23 mg/dL   Creatinine, Ser 1.23 0.61 - 1.24 mg/dL   Calcium 9.9 8.9 - 10.3 mg/dL   GFR, Estimated >60 >60 mL/min    Comment: (NOTE) Calculated using the CKD-EPI Creatinine Equation (2021)    Anion gap 8 5 - 15    Comment: [facility] [address] [id number] CBC     Status: Abnormal   Collection Time: 10/06/21  1:30 PM  Result Value Ref Range   WBC 6.8 4.0 -  10.5 K/uL   RBC  4.24 4.22 - 5.81 MIL/uL   Hemoglobin 12.4 (L) 13.0 - 17.0 g/dL   HCT 38.4 (L) 39.0 - 52.0 %   MCV 90.6 80.0 - 100.0 fL   MCH 29.2 26.0 - 34.0 pg   MCHC 32.3 30.0 - 36.0 g/dL   RDW 14.2 11.5 - 15.5 %   Platelets 502 (H) 150 - 400 K/uL   nRBC 0.3 (H) 0.0 - 0.2 %    Comment: Performed at [facility], [address] [place], [place] [address]  APTT     Status: None   Collection Time: 10/06/21  1:30 PM  Result Value Ref Range   aPTT 35 24 - 36 seconds    Comment: Performed at [facility], [address] [place], [place] [address]  Protime-INR     Status: Abnormal   Collection Time: 10/06/21  1:30 PM  Result Value Ref Range   Prothrombin Time 16.3 (H) 11.4 - 15.2 seconds   INR 1.3 (H) 0.8 - 1.2    Comment: (NOTE) INR goal varies based on device and disease states. Performed at [facility], [address], [place], [place] [address]    CT VENOGRAM ABD/PELVIS/LOWER EXT BILAT  Result Date: 10/06/2021 CLINICAL DATA:  Right leg pain with known occlusive DVT. EXAM: CT VENOGRAM ABDOMEN AND PELVIS AND LOWER EXTREMITY BILATERAL TECHNIQUE: CT imaging of the abdomen pelvis and bilateral lower extremities performed following administration of intravenous contrast. RADIATION DOSE REDUCTION: This exam was performed according to the departmental dose-optimization program which includes automated exposure control, adjustment of the mA and/or kV according to patient size and/or use of iterative reconstruction technique. CONTRAST:  132m OMNIPAQUE IOHEXOL 350 MG/ML SOLN COMPARISON:  Right lower extremity DVT ultrasound 10/04/2021 FINDINGS: Lower chest: Large sliding hiatal hernia. Mild dependent atelectasis. No focal opacity. Hepatobiliary: No focal liver abnormality is seen. No gallstones, gallbladder wall thickening, or biliary dilatation. Pancreas: Surgical changes of prior distal pancreatectomy. The remaining pancreatic tissue demonstrates no evidence of mass or  inflammatory changes. Spleen: Surgically absent. Adrenals/Urinary Tract: Unremarkable adrenal glands. No hydronephrosis, nephrolithiasis or enhancing renal mass. No evidence of hydronephrosis. The ureters are normal. Diffuse mild thickening of the bladder wall with a right-sided Peri ureteral diverticulum. Stomach/Bowel: No focal bowel wall thickening or evidence of obstruction. Vascular/Lymphatic: Scattered atherosclerotic plaque along the abdominal aorta. Mild tortuosity. No evidence of aneurysm or dissection. Venous structures are well evaluated. The right common femoral vein is patent as is the saphenofemoral junction. DVT begins proximally in the right femoral vein. Reproductive: Surgical changes of prior TURP. Other: No abdominal wall hernia or abnormality. No abdominopelvic ascites. Musculoskeletal: No acute fracture or aggressive appearing lytic or blastic osseous lesion. Multilevel degenerative disc disease. IVC: A Bard Denali IVC filter is present within the infrarenal IVC. There is a small amount of thrombus within the nose cone of the catheter. The IVC remains patent. Portal and mesenteric veins: Widely patent and unremarkable. Bilateral iliac veins: Both iliac veins are patent without evidence of venous compression or obstruction. Right lower extremity: The right common femoral vein is widely patent. The saphenofemoral junction is patent. No evidence of thrombus in the profunda femoral vein. Thrombus begins in the proximal femoral vein in the upper thigh and extends throughout the femoral vein and into the popliteal vein. Evaluation of the popliteal vein is slightly limited due to knee arthroplasty prosthesis. The calf veins appear largely patent. Left lower extremity: No  evidence of deep venous thrombosis in the left lower extremity. IMPRESSION: 1. Positive for DVT within the right femoral vein beginning at the vein origin in the upper thigh and extending through the popliteal vein. 2. No evidence of  extension into the iliac venous system or IVC. 3. No evidence of iliac vein compression physiology. 4. Bard Denali IVC filter present in the infrarenal IVC with a small amount of nonocclusive thrombus in the nose cone. 5. Moderately large sliding hiatal hernia. 6.  Aortic Atherosclerosis (ICD10-I70.0). 7. 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 8. Additional ancillary findings as above. Electronically Signed   By: Jacqulynn Cadet M.D.   On: 10/06/2021 14:59    Pending Labs Unresulted Labs (From admission, onward)     Start     Ordered   10/07/21 0100  APTT  Once-Timed,   STAT       See Hyperspace for full Linked Orders Report.   10/06/21 1555   10/07/21 0100  Heparin level (unfractionated)  Once-Timed,   URGENT       See Hyperspace for full Linked Orders Report.   10/06/21 1555            Vitals/Pain Today's Vitals   10/06/21 1600 10/06/21 1630 10/06/21 1700 10/06/21 1754  BP: 137/82 133/77    Pulse: 70 71 69 72  Resp: '19 14 18 13  '$ Temp:      TempSrc:      SpO2: 99% 99% 100% 100%  Weight:      Height:      PainSc:        Isolation Precautions No active isolations  Medications Medications  heparin ADULT infusion 100 units/mL (25000 units/258m) (1,700 Units/hr Intravenous New Bag/Given 10/06/21 1655)  iohexol (OMNIPAQUE) 350 MG/ML injection 125 mL (125 mLs Intravenous Contrast Given 10/06/21 1423)  HYDROmorphone (DILAUDID) injection 0.25 mg (0.25 mg Intravenous Given 10/06/21 1650)  ondansetron (ZOFRAN) injection 4 mg (4 mg Intravenous Given 10/06/21 1650)    Mobility walks with person assist Low fall risk   R Recommendations: See Admitting Provider Note  Report given to: STeodoro Kil RN

## 2021-10-06 NOTE — Assessment & Plan Note (Signed)
Hold home hypoglycemics Sensitive SSI Q4H while NPO

## 2021-10-06 NOTE — Progress Notes (Signed)
ANTICOAGULATION CONSULT NOTE - Initial Consult  Pharmacy Consult for heparin Indication: DVT  Allergies  Allergen Reactions   Diclofenac Swelling and Other (See Comments)    Elevated liver enzymes and lips swell    Doxycycline Other (See Comments)    Severe nose bleeds    Doxycycline Hyclate Other (See Comments)    Epistaxis   Duloxetine Other (See Comments)    Night sweats     Hydrocodone Swelling and Other (See Comments)    Facial swelling- can tolerate "immediate-release" Hydrocodone, though   Oxybutynin Swelling and Other (See Comments)    Swelling    Quinine Other (See Comments)    Platelets dropped- consumptive coagulopathy     Sertraline Other (See Comments)    Can't sleep or urinate   Duloxetine Hcl Other (See Comments)    Night sweats   Morphine And Related Nausea And Vomiting, Swelling and Other (See Comments)    Facial swelling and lips swell, but tolerates immediate-release oxycodone as well as hydrocodone    Oxycontin [Oxycodone Hcl] Swelling and Other (See Comments)    Lips swell, but tolerates immediate-release oxycodone as well as hydrocodone   Sertraline Hcl Other (See Comments)    Urinary problems and insomnia   Tape Other (See Comments)    SKIN IS FRAGILE- CERTAIN BANDAGES TEAR OFF THE SKIN   Voltaren [Diclofenac Sodium] Other (See Comments)    Elevated liver enzymes   Gabapentin Other (See Comments)         Patient Measurements: Height: 6' (182.9 cm) Weight: 122 kg (268 lb 15.4 oz) IBW/kg (Calculated) : 77.6 Heparin Dosing Weight: 104kg  Vital Signs: Temp: 97.6 F (36.4 C) (08/17 1536) Temp Source: Oral (08/17 1536) BP: 129/71 (08/17 1536) Pulse Rate: 69 (08/17 1536)  Labs: Recent Labs    10/05/21 1529 10/05/21 1947 10/06/21 1330  HGB 12.5* 13.0 12.4*  HCT 38.9* 42.0 38.4*  PLT 468* 433* 502*  APTT  --   --  35  LABPROT  --   --  16.3*  INR  --   --  1.3*  CREATININE 1.16 1.16 1.23    Estimated Creatinine Clearance: 68.9  mL/min (by C-G formula based on SCr of 1.23 mg/dL).   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" no pulmonary md, sees dr [name]   Factor V Leiden mutation ([place])    "never had blood clots"   GERD (gastroesophageal reflux disease)    Hiatal hernia    History of kidney stones    multiple kidney stones-not a problem at present   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, pT1 pNX) no further treatment   History of panic attacks    History 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  [place] with DVT, plts 502  Goal of Therapy:  Heparin level 0.3-0.7 units/ml aPTT 66-102 seconds Monitor platelets by anticoagulation  protocol: Yes   Plan:  Heparin gtt at 1700 units/hr, no bolus F/u 8 hour aPTT/HL F/u any intervention and long term [facility] plan s/p  Bertis Ruddy, PharmD Clinical Pharmacist ED Pharmacist Phone # [phone] 10/06/2021 3:55 PM

## 2021-10-06 NOTE — Assessment & Plan Note (Signed)
No longer on long term opoid therapy (pt had very bad experience with dilaudid, almost committed suicide, etc).

## 2021-10-06 NOTE — ED Triage Notes (Signed)
Patient presents to ED via POV from home. Here with right leg pain. Known occlusive DVT. Per chart review foot has a bluish hue and pulses were difficult to palpate in clinic.

## 2021-10-06 NOTE — Progress Notes (Signed)
Plan of Care Note for accepted transfer   Patient: Derek [name] MRN: [id number]   DOA: 10/06/2021  Facility requesting transfer: [facility] Requesting Provider: EDP Reason for transfer: Symptomatic DVT needs thrombectomy by IR Facility course:  Blood pressure 129/71, pulse 69, temperature 97.6 F (36.4 C), temperature source Oral, resp. rate 14, height 6' (1.829 m), weight 122 kg, SpO2 97 %. H/o factor V Leiden presented with right leg pain, symptomatic DVT, failed Eliquis, hematology Dr. [name] recommended thrombectomy by IR, start heparin drip with a plan to transition to Coumadin.  Plan of care: The patient is accepted for admission to Telemetry unit, at [facility]..     Author: Florencia Reasons, MD PhD FACP 10/06/2021  Check [email or web address] for on-call coverage.  Nursing staff, Please call [place] number on Amion as soon as patient's arrival, so appropriate admitting provider can evaluate the pt.

## 2021-10-06 NOTE — ED Notes (Signed)
Report given to [facility]. 

## 2021-10-06 NOTE — Telephone Encounter (Signed)
        Patient  visited [place] ed on 10/06/2021  for leg pain  Patient is now being admitted for DVT will cease ed follow up as he is inpatient   [address] , [place] [address] Email : [email or web address]

## 2021-10-06 NOTE — ED Notes (Signed)
Attempted 2nd IV access prior to initiating heparin infusion. Unsuccessful x 2.

## 2021-10-06 NOTE — Assessment & Plan Note (Addendum)
Long term remission at this point. Following with hemeonc DVT this time around believed to be secondary to stopping eliquis for 2 months prior to onset.  And eliquis not really working for patient.

## 2021-10-07 ENCOUNTER — Encounter (HOSPITAL_COMMUNITY): Payer: Self-pay | Admitting: Internal Medicine

## 2021-10-07 ENCOUNTER — Other Ambulatory Visit (HOSPITAL_COMMUNITY): Payer: Self-pay

## 2021-10-07 ENCOUNTER — Telehealth (HOSPITAL_COMMUNITY): Payer: Self-pay | Admitting: Pharmacy Technician

## 2021-10-07 DIAGNOSIS — E669 Obesity, unspecified: Secondary | ICD-10-CM | POA: Diagnosis not present

## 2021-10-07 DIAGNOSIS — Z8507 Personal history of malignant neoplasm of pancreas: Secondary | ICD-10-CM

## 2021-10-07 DIAGNOSIS — M549 Dorsalgia, unspecified: Secondary | ICD-10-CM

## 2021-10-07 DIAGNOSIS — I82401 Acute embolism and thrombosis of unspecified deep veins of right lower extremity: Secondary | ICD-10-CM | POA: Diagnosis not present

## 2021-10-07 DIAGNOSIS — Z794 Long term (current) use of insulin: Secondary | ICD-10-CM | POA: Diagnosis not present

## 2021-10-07 DIAGNOSIS — Z7901 Long term (current) use of anticoagulants: Secondary | ICD-10-CM

## 2021-10-07 DIAGNOSIS — E119 Type 2 diabetes mellitus without complications: Secondary | ICD-10-CM | POA: Diagnosis not present

## 2021-10-07 DIAGNOSIS — Z87891 Personal history of nicotine dependence: Secondary | ICD-10-CM

## 2021-10-07 DIAGNOSIS — G8929 Other chronic pain: Secondary | ICD-10-CM | POA: Diagnosis not present

## 2021-10-07 LAB — GLUCOSE, CAPILLARY
Glucose-Capillary: 118 mg/dL — ABNORMAL HIGH (ref 70–99)
Glucose-Capillary: 135 mg/dL — ABNORMAL HIGH (ref 70–99)
Glucose-Capillary: 141 mg/dL — ABNORMAL HIGH (ref 70–99)
Glucose-Capillary: 155 mg/dL — ABNORMAL HIGH (ref 70–99)
Glucose-Capillary: 211 mg/dL — ABNORMAL HIGH (ref 70–99)

## 2021-10-07 LAB — HEMOGLOBIN A1C
Hgb A1c MFr Bld: 8.3 % — ABNORMAL HIGH (ref 4.8–5.6)
Mean Plasma Glucose: 191.51 mg/dL

## 2021-10-07 LAB — BASIC METABOLIC PANEL
Anion gap: 6 (ref 5–15)
BUN: 15 mg/dL (ref 8–23)
CO2: 23 mmol/L (ref 22–32)
Calcium: 9.9 mg/dL (ref 8.9–10.3)
Chloride: 105 mmol/L (ref 98–111)
Creatinine, Ser: 1.23 mg/dL (ref 0.61–1.24)
GFR, Estimated: >60 mL/min (ref >60)
Glucose, Bld: 168 mg/dL — ABNORMAL HIGH (ref 70–99)
Potassium: 4.3 mmol/L (ref 3.5–5.1)
Sodium: 134 mmol/L — ABNORMAL LOW (ref 135–145)

## 2021-10-07 LAB — APTT
aPTT: 56 seconds — ABNORMAL HIGH (ref 24–36)
aPTT: 61 seconds — ABNORMAL HIGH (ref 24–36)
aPTT: 100 seconds — ABNORMAL HIGH (ref 24–36)

## 2021-10-07 LAB — CBC
HCT: 36.2 % — ABNORMAL LOW (ref 39.0–52.0)
Hemoglobin: 11.9 g/dL — ABNORMAL LOW (ref 13.0–17.0)
MCH: 30.1 pg (ref 26.0–34.0)
MCHC: 32.9 g/dL (ref 30.0–36.0)
MCV: 91.4 fL (ref 80.0–100.0)
Platelets: 438 10*3/uL — ABNORMAL HIGH (ref 150–400)
RBC: 3.96 MIL/uL — ABNORMAL LOW (ref 4.22–5.81)
RDW: 14 % (ref 11.5–15.5)
WBC: 8.7 10*3/uL (ref 4.0–10.5)
nRBC: 0.2 % (ref 0.0–0.2)

## 2021-10-07 LAB — HEPARIN LEVEL (UNFRACTIONATED): Heparin Unfractionated: >1.1 IU/mL — ABNORMAL HIGH (ref 0.30–0.70)

## 2021-10-07 MED ORDER — FENTANYL 25 MCG/HR TD PT72
1.0000 | MEDICATED_PATCH | TRANSDERMAL | Status: DC
  Administered 2021-10-07: 1 via TRANSDERMAL
  Filled 2021-10-07: qty 1

## 2021-10-07 MED ORDER — LIDOCAINE 5 % EX PTCH
1.0000 | MEDICATED_PATCH | CUTANEOUS | Status: DC
  Administered 2021-10-07 – 2021-10-09 (×3): 1 via TRANSDERMAL
  Filled 2021-10-07 (×3): qty 1

## 2021-10-07 NOTE — Telephone Encounter (Signed)
Pharmacy Patient Advocate Encounter  Insurance verification completed.    The patient is insured through [employer]   The patient is currently admitted and ran test claims for the following: enoxaparin (Lovenox) 100 mg/ml injection.  Copays and coinsurance results were relayed to Inpatient clinical team.

## 2021-10-07 NOTE — Consult Note (Cosign Needed)
[place]  [phone] Fax:([phone]) [place]  Reason for Consultation: Right lower extremity DVT  HPI: Mr. Derek [name] is a 76 year old male with a past medical history significant for factor V Leiden with multiple blood clots, pancreatic cancer and long-term remission since 2017, chronic back pain.  He was seen at the cancer center on 8/16 for a visit.  He was having increased right lower extremity swelling and pain.  Doppler ultrasound was performed 10/04/2021 which showed extensive thrombus that was occlusive in the right leg.  He had been on Eliquis prior to admission.  He was advised to be admitted to the hospital for evaluation by either vascular surgery or IR.  He has been started on heparin drip.  Today, he reports improvement in his right lower extremity edema as well as pain since starting on heparin.  He was evaluated by IR earlier today with plans for right leg venogram with mechanical thrombectomy today.  He complains of back pain which has been chronic for him.  He is not having any bleeding.  He otherwise has no new issues today.  Hematology was asked see the patient make recommendations regarding his recurrent DVT.  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" no pulmonary md, sees dr [name]   Factor V Leiden mutation ([place])    "never had blood clots"   GERD (gastroesophageal reflux disease)    Hiatal hernia    History of kidney stones    multiple kidney stones-not a problem at present   History of pancreatic cancer    01/ 2017  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, pT1 pNX) no further treatment   History of panic attacks    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  :     Past Surgical History:  Procedure Laterality Date   ANAL FISTULECTOMY N/A 04/01/2013   Procedure: EXAM UNDER ANESTHESIA WITH ANAL FISSURectomy, sphincterotomy and internal hemorrhoidectomy;  Surgeon: Harl Bowie, MD;  Location: [place];  Service: General;  Laterality: N/A;   APPLICATION OF WOUND VAC N/A 05/16/2015   Procedure: APPLICATION OF  WOUND VAC;  Surgeon: Armandina Gemma, MD;  Location: [facility];  Service: General;  Laterality: N/A;   APPLICATION OF WOUND VAC N/A 05/20/2015   Procedure: EXHANGE OF ABDOMINAL  WOUND VAC DRESSING;  Surgeon: Armandina Gemma, MD;  Location: [facility];  Service: General;  Laterality: N/A;   COLONOSCOPY     CYSTOSCOPY N/A 05/23/2021   Procedure: Consuela Mimes;  Surgeon: Franchot Gallo, MD;  Location: [facility];  Service: Urology;  Laterality: N/A;   CYSTOSCOPY WITH INSERTION OF UROLIFT N/A 10/12/2016   Procedure: CYSTOSCOPY WITH INSERTION OF UROLIFT;  Surgeon: Franchot Gallo, MD;  Location: [facility];  Service: Urology;  Laterality: N/A;   EUS N/A 12/31/2014    Procedure: UPPER ENDOSCOPIC ULTRASOUND (EUS) LINEAR;  Surgeon: Milus Banister, MD;  Location[facility];  Service: Endoscopy;  Laterality: N/A;   HARDWARE REMOVAL Left 2013   left ankle   HEMORRHOIDECTOMY WITH HEMORRHOID BANDING     INCISION AND DRAINAGE ABSCESS N/A 05/14/2015   Procedure: DRAINAGE OF INFECTED PANCREATIC PSEUDOCYST;  Surgeon: Armandina Gemma, MD;  Location: [facility];  Service: General;  Laterality: N/A;   INCISION AND DRAINAGE OF WOUND N/A 05/16/2015   Procedure: IRRIGATION AND DEBRIDEMENT WOUND;  Surgeon: Armandina Gemma, MD;  Location: [facility];  Service: General;  Laterality: N/A;   INCISIONAL HERNIA REPAIR N/A 11/12/2015   Procedure: REPAIR INCISIONAL HERNIA WITH MESH;  Surgeon: Armandina Gemma, MD;  Location: [place];  Service: General;  Laterality: N/A;   INGUINAL HERNIA REPAIR Right 1974;  [place] N/A 11/12/2015   Procedure: INSERTION OF MESH;  Surgeon: Armandina Gemma, MD;  Location: [place];  Service: General;  Laterality: N/A;   IR GENERIC HISTORICAL  04/20/2015   IR RADIOLOGIST EVAL & MGMT 04/20/2015 [facility] INTERV RAD   IR IVC FILTER PLMT / S&I Burke Keels GUID/MOD SED  11/25/2020   IR RADIOLOGY PERIPHERAL GUIDED IV START  11/25/2020   IR US GUIDE VASC ACCESS RIGHT  11/25/2020   KNEE ARTHROSCOPY Bilateral right 08-08-2000;  left 11-23-2000   meniscal repair and chondroplasty's   KNEE ARTHROSCOPY  03/01/2012   Procedure: ARTHROSCOPY KNEE;  Surgeon: Gearlean Alf, MD;  Location: [facility];  Service: Orthopedics;  Laterality: Left;  WITH SYNOVECTOMY    LAPAROTOMY N/A 05/14/2015   Procedure: EXPLORATORY LAPAROTOMY;  Surgeon: Armandina Gemma, MD;  [facility];  Service: General;  Laterality: N/A;   MASS EXCISION N/A 12/24/2019   Procedure: EXCISION LIP CARCINOMA;  Surgeon: Leta Baptist, MD;  Location: [place];  Service: ENT;  Laterality: N/A;   ORIF LEFT ANKLE FX  08/20/2009   distal tib-fib and malleolus   ROTATOR CUFF REPAIR Bilateral right 1994; left 1993, left 1993    TOTAL KNEE ARTHROPLASTY Right 06/02/2013   Procedure: RIGHT TOTAL KNEE ARTHROPLASTY;  Surgeon: Gearlean Alf, MD;  [facility];  Service: Orthopedics;  Laterality: Right;   TOTAL KNEE ARTHROPLASTY Left 01-31-2008   dr Wynelle Link   TOTAL KNEE REVISION Right 01/23/2018   Procedure: right knee polyethylene revision;  Surgeon: Gaynelle Arabian, MD;  Location: [facility];  Service: Orthopedics;  Laterality: Right;  88mn   TRANSURETHRAL RESECTION OF BLADDER TUMOR N/A 05/23/2021   Procedure: TRANSURETHRAL BIOPSY OF BLADDER;  Surgeon: DFranchot Gallo MD;  [facility]  Service: Urology;  Laterality: N/A;   TRANSURETHRAL RESECTION OF PROSTATE N/A 04/21/2019   Procedure: TRANSURETHRAL RESECTION OF THE PROSTATE (TURP);  Surgeon: DFranchot Gallo MD;  Location:  [place];  Service: Urology;  Laterality: N/A;   UMBILICAL HERNIA REPAIR  08-11-1999   dr Ninfa Linden   UPPER GASTROINTESTINAL ENDOSCOPY  sept 2016   WOUND EXPLORATION N/A 09/21/2016   Procedure: WOUND EXPLORATION AND DEBRIDEMENT ABDOMINAL WALL;  Surgeon: Armandina Gemma, MD;  [facility];  Service: General;  Laterality: N/A;  :   CURRENT MEDS: Current Facility-Administered Medications  Medication Dose Route Frequency Provider Last Rate Last Admin   acetaminophen (TYLENOL) tablet 650 mg  650 mg Oral Q6H PRN Etta Quill, DO       Or   acetaminophen (TYLENOL) suppository 650 mg  650 mg Rectal Q6H PRN Etta Quill, DO       carvedilol (COREG) tablet 25 mg  25 mg Oral QHS Jennette Kettle M, DO   25 mg at 10/06/21 2243   fentaNYL (SUBLIMAZE) injection 12.5-50 mcg  12.5-50 mcg Intravenous Q2H PRN Etta Quill, DO   50 mcg at 10/07/21 1522   heparin ADULT infusion 100 units/mL (25000 units/260m)  2,100 Units/hr Intravenous Continuous MKris Mouton RPH 21 mL/hr at 10/07/21 1523 2,100 Units/hr at 10/07/21 1523   insulin aspart (novoLOG) injection 0-9 Units  0-9 Units Subcutaneous Q4H GJennette KettleM, DO   1 Units at 10/07/21 0430   lidocaine (LIDODERM) 5 % 1 patch  1 patch Transdermal Q24H XFlorencia Reasons MD   1 patch at 10/07/21 1305   ondansetron (ZOFRAN) tablet 4 mg  4 mg Oral Q6H PRN GEtta Quill DO       Or   ondansetron ([facility] injection 4 mg  4 mg Intravenous Q6H PRN GEtta Quill DO       tamsulosin ([facility] capsule 0.4 mg  0.4 mg Oral QHS GAlcario Drought Jared M, DO   0.4 mg at 10/06/21 2243       Allergies  Allergen Reactions   Ditropan [Oxybutynin] Swelling and Other (See Comments)   Quinine Other (See Comments)    Platelets dropped- consumptive coagulopathy     Vibra-Tab [Doxycycline] Other (See Comments)    Epistaxis     Voltaren [Diclofenac] Swelling and Other (See Comments)    Elevated liver enzymes Lip swelling    Zoloft [Sertraline] Other (See Comments)    Insomnia Acute urinary retention   Cymbalta [Duloxetine Hcl] Other (See Comments)    Night sweats   Morphine And Related Nausea And Vomiting, Swelling and Other (See Comments)    Facial and lip swelling OK to use IR form of hydrocodone and oxycodone   Oxycontin [Oxycodone Hcl] Swelling and Other (See Comments)    Lip swelling OK to use IR form   Tape Other (See Comments)    Skin tears easily   Voltaren [Diclofenac Sodium] Other (See Comments)    Elevated liver enzymes   Zohydro Er [Hydrocodone Bitartrate Er] Swelling    Facial swelling OK to use IR form   Neurontin [Gabapentin] Other (See Comments)    Unknown reaction   :   Family History  Problem Relation Age of Onset   Bone cancer Father        Jaw   Factor V Leiden deficiency Daughter    Diabetes Maternal Grandmother    Heart disease Paternal Uncle    Colon cancer Neg Hx    Esophageal cancer Neg Hx    Stomach cancer Neg Hx    Rectal cancer Neg Hx   :   Social History   Socioeconomic History   Marital status:  Married    Spouse name: Derek [name]   Number of children: 2   Years of education: Not on file   Highest education  level: Not on file  Occupational History   Occupation: retired   Tobacco Use   Smoking status: Former    Packs/day: 4.00    Years: 17.00    Total pack years: 68.00    Types: Cigarettes    Quit date: 02/21/1975    Years since quitting: 46.6   Smokeless tobacco: Never  Vaping Use   Vaping Use: Never used  Substance and Sexual Activity   Alcohol use: No    Alcohol/week: 0.0 standard drinks of alcohol   Drug use: No   Sexual activity: Yes  Other Topics Concern   Not on file  Social History Narrative   Married, retired Scientist, product/process development   1 son 1 daughter   2 caffeine drinks/day   10/18/2014   Social Determinants of Health   Financial Resource Strain: Not on file  Food Insecurity: No Food Insecurity (12/22/2019)   Hunger Vital Sign    Worried About Running Out of Food in the Last Year: Never true    [place] in the Last Year: Never true  Transportation Needs: No Transportation Needs (12/22/2019)   PRAPARE - Hydrologist (Medical): No    Lack of Transportation (Non-Medical): No  Physical Activity: Not on file  Stress: Not on file  Social Connections: Not on file  Intimate Partner Violence: Not on file  :  REVIEW OF SYSTEMS:  A comprehensive 14 point review of systems was negative except as noted in the HPI.    Exam: Patient Vitals for the past 24 hrs:  BP Temp Temp src Pulse Resp SpO2 Height Weight  10/07/21 1447 (!) 111/92 97.6 F (36.4 C) Oral -- 18 -- -- --  10/07/21 0415 106/65 97.6 F (36.4 C) Oral 68 17 96 % -- 120 kg  10/06/21 2128 133/85 97.6 F (36.4 C) Oral 72 17 96 % 6' (1.829 m) 120.5 kg  10/06/21 2030 (!) 145/85 -- -- 67 17 98 % -- --  10/06/21 1945 115/73 -- -- 68 11 99 % -- --  10/06/21 1944 115/73 97.6 F (36.4 C) Oral 67 19 98 % -- --  10/06/21 1754 -- -- -- 72 13 100 % -- --  10/06/21 1700 -- -- -- 69 18 100 % -- --  10/06/21 1630 133/77 -- -- 71 14 99 % -- --  10/06/21 1600 137/82 -- -- 70 19 99 % -- --    Physical  Exam Vitals reviewed.  Constitutional:      General: He is not in acute distress. HENT:     Head: Normocephalic.  Eyes:     General: No scleral icterus.    Conjunctiva/sclera: Conjunctivae normal.  Pulmonary:     Effort: Pulmonary effort is normal. No respiratory distress.  Musculoskeletal:     Right lower leg: 2+ Edema present.  Skin:    General: Skin is warm and dry.  Neurological:     Mental Status: He is alert and oriented to person, place, and time.    LABS:  Lab Results  Component Value Date   WBC 8.7 10/07/2021   HGB 11.9 (L) 10/07/2021   HCT 36.2 (L) 10/07/2021   PLT 438 (H) 10/07/2021   GLUCOSE 168 (H) 10/07/2021   CHOL 109 05/05/2013   TRIG 120 05/05/2013   HDL 43 05/05/2013   LDLCALC  42 05/05/2013   ALT 14 10/05/2021   AST 13 (L) 10/05/2021   NA 134 (L) 10/07/2021   K 4.3 10/07/2021   CL 105 10/07/2021   CREATININE 1.23 10/07/2021   BUN 15 10/07/2021   CO2 23 10/07/2021   INR 1.3 (H) 10/06/2021   HGBA1C 8.3 (H) 10/07/2021    CT VENOGRAM ABD/PELVIS/LOWER EXT BILAT  Result Date: 10/06/2021 CLINICAL DATA:  Right leg pain with known occlusive DVT. EXAM: CT VENOGRAM ABDOMEN AND PELVIS AND LOWER EXTREMITY BILATERAL TECHNIQUE: CT imaging of the abdomen pelvis and bilateral lower extremities performed following administration of intravenous contrast. RADIATION DOSE REDUCTION: This exam was performed according to the departmental dose-optimization program which includes automated exposure control, adjustment of the mA and/or kV according to patient size and/or use of iterative reconstruction technique. CONTRAST:  148m OMNIPAQUE IOHEXOL 350 MG/ML SOLN COMPARISON:  Right lower extremity DVT ultrasound 10/04/2021 FINDINGS: Lower chest: Large sliding hiatal hernia. Mild dependent atelectasis. No focal opacity. Hepatobiliary: No focal liver abnormality is seen. No gallstones, gallbladder wall thickening, or biliary dilatation. Pancreas: Surgical changes of prior distal  pancreatectomy. The remaining pancreatic tissue demonstrates no evidence of mass or inflammatory changes. Spleen: Surgically absent. Adrenals/Urinary Tract: Unremarkable adrenal glands. No hydronephrosis, nephrolithiasis or enhancing renal mass. No evidence of hydronephrosis. The ureters are normal. Diffuse mild thickening of the bladder wall with a right-sided Peri ureteral diverticulum. Stomach/Bowel: No focal bowel wall thickening or evidence of obstruction. Vascular/Lymphatic: Scattered atherosclerotic plaque along the abdominal aorta. Mild tortuosity. No evidence of aneurysm or dissection. Venous structures are well evaluated. The right common femoral vein is patent as is the saphenofemoral junction. DVT begins proximally in the right femoral vein. Reproductive: Surgical changes of prior TURP. Other: No abdominal wall hernia or abnormality. No abdominopelvic ascites. Musculoskeletal: No acute fracture or aggressive appearing lytic or blastic osseous lesion. Multilevel degenerative disc disease. IVC: A Bard Denali IVC filter is present within the infrarenal IVC. There is a small amount of thrombus within the nose cone of the catheter. The IVC remains patent. Portal and mesenteric veins: Widely patent and unremarkable. Bilateral iliac veins: Both iliac veins are patent without evidence of venous compression or obstruction. Right lower extremity: The right common femoral vein is widely patent. The saphenofemoral junction is patent. No evidence of thrombus in the profunda femoral vein. Thrombus begins in the proximal femoral vein in the upper thigh and extends throughout the femoral vein and into the popliteal vein. Evaluation of the popliteal vein is slightly limited due to knee arthroplasty prosthesis. The calf veins appear largely patent. Left lower extremity: No evidence of deep venous thrombosis in the left lower extremity. IMPRESSION: 1. Positive for DVT within the right femoral vein beginning at the vein  origin in the upper thigh and extending through the popliteal vein. 2. No evidence of extension into the iliac venous system or IVC. 3. No evidence of iliac vein compression physiology. 4. Bard Denali IVC filter present in the infrarenal IVC with a small amount of nonocclusive thrombus in the nose cone. 5. Moderately large sliding hiatal hernia. 6.  Aortic Atherosclerosis (ICD10-I70.0). 7. 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 8. Additional ancillary findings as above. Electronically Signed   By: HJacqulynn CadetM.D.   On: 10/06/2021 14:59   UK[place]Venous Img Lower Unilateral Right (DVT)  Result Date: 10/04/2021 CLINICAL DATA:  Right lower extremity swelling. EXAM: Right LOWER EXTREMITY VENOUS DOPPLER ULTRASOUND TECHNIQUE: Gray-scale sonography with graded compression, as well as color Doppler  and duplex ultrasound were performed to evaluate the lower extremity deep venous systems from the level of the common femoral vein and including the common femoral, femoral, profunda femoral, popliteal and calf veins including the posterior tibial, peroneal and gastrocnemius veins when visible. The superficial great saphenous vein was also interrogated. Spectral Doppler was utilized to evaluate flow at rest and with distal augmentation maneuvers in the common femoral, femoral and popliteal veins. COMPARISON:  September 12, 2021. FINDINGS: Contralateral Common Femoral Vein: Respiratory phasicity is normal and symmetric with the symptomatic side. No evidence of thrombus. Normal compressibility. Common Femoral Vein: No evidence of thrombus. Normal compressibility, respiratory phasicity and response to augmentation. Saphenofemoral Junction: No evidence of thrombus. Normal compressibility and flow on color Doppler imaging. Profunda Femoral Vein: No evidence of thrombus. Normal compressibility and flow on color Doppler imaging. Femoral Vein: Noncompressible with no flow consistent with occlusive thrombus.  Popliteal Vein: Noncompressible with no flow consistent with occlusive thrombus. Calf Veins: Posterior tibial and peroneal veins are noncompressible with no flow consistent with occlusive thrombus. Superficial Great Saphenous Vein: No evidence of thrombus. Normal compressibility. Venous Reflux:  None. Other Findings:  None. IMPRESSION: Occlusive deep venous thrombosis is noted in the right femoral, popliteal, posterior tibial and peroneal veins. These results will be called to the ordering clinician or representative by the Radiologist Assistant, and communication documented in the PACS or zVision Dashboard. Electronically Signed   By: Marijo Conception M.D.   On: 10/04/2021 11:36   US Venous Img Lower Unilateral Right  Result Date: 09/12/2021 CLINICAL DATA:  Known deep venous thrombosis in right leg without improvement after blood thinners. EXAM: RIGHT LOWER EXTREMITY VENOUS DOPPLER ULTRASOUND TECHNIQUE: Gray-scale sonography with compression, as well as color and duplex ultrasound, were performed to evaluate the deep venous system(s) from the level of the common femoral vein through the popliteal and proximal calf veins. COMPARISON:  09/02/2021 FINDINGS: VENOUS Nonocclusive thrombus within the right common femoral vein. Occlusive thrombus within the right femoral vein. Nonocclusive thrombus within the right popliteal vein. Normal findings within the profunda femoral vein and saphenofemoral junction. Calf veins not visualized. Limited views of the contralateral common femoral vein are unremarkable. OTHER None. Limitations: none IMPRESSION: Thrombus within the right common femoral, femoral, popliteal veins as detailed above. Electronically Signed   By: Abigail Miyamoto M.D.   On: 09/12/2021 21:25     ASSESSMENT AND PLAN:   1.  Right lower extremity DVT 2.  Factor V Leiden mutation 3.  Chronic back pain 4.  Hypertension 5.  Diabetes mellitus 6.  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  He has failed Eliquis. -He will continue on a heparin drip for now with mechanical thrombectomy planned for later today by IR. -He also has an IVC filter in place which may be blocked. -Would recommend transitioning the patient to warfarin prior to hospital discharge.  If he is stable for discharge before INR is therapeutic, can bridge with Lovenox to warfarin.  Mikey Bussing, DNP, AGPCNP-BC, AOCNP   ADDENDUM: I saw and examined Derek [name] this morning.  He has a swollen right leg.  His swelling does look better with the heparin.  He is to go to try to have the thrombectomy this morning.  As far as long-term anticoagulation, I really think that Coumadin is going to be a huge  problem with all of his medications.  I hate to say this but I think he is probably going to need either Lovenox or Arixtra.  I would prefer Arixtra since this is a daily dose.  He says that his wife can do this.  I know that injectable anticoagulation is not that pleasant I think it would be more effective and more manageable then Coumadin.  Again with all of his medications and all of his health issues, I just think Coumadin management would really be complicated.  He currently is on heparin.  Once he has the thrombectomy done, then I think we can see about switching over to Arixtra.  He probably will need the 10 mg daily dose.  We will have to see if insurance will cover this.  We will then have to get his wife to learn how to do this.  His labs this morning show white cell count of 8.5.  Hemoglobin 11.7.  Platelet count 431,000.  His BUN is 13 creatinine 1.19.  I know it is been a very challenging situation to get to this point.  I am so grateful for everybody trying to help Derek [name].  I know that he can be difficult.  However, I know that he also will do  what needs to be done to help his legs so he will have a better quality of life.  Again, I think in the long-term, Arixtra probably would be the optimal way of trying to treat him.   Lattie Haw, MD  Darlyn Chamber 17:14

## 2021-10-07 NOTE — Care Management (Signed)
10-07-21 1037 Case Manager received a consult to check Lovenox cost. Benefits check submitted and Case Manager will follow for cost.

## 2021-10-07 NOTE — Progress Notes (Signed)
ANTICOAGULATION CONSULT NOTE  Pharmacy Consult for heparin Indication: DVT  Allergies  Allergen Reactions   Diclofenac Swelling and Other (See Comments)    Elevated liver enzymes and lips swell    Doxycycline Other (See Comments)    Severe nose bleeds    Doxycycline Hyclate Other (See Comments)    Epistaxis   Duloxetine Other (See Comments)    Night sweats     Hydrocodone Swelling and Other (See Comments)    Facial swelling- can tolerate "immediate-release" Hydrocodone, though   Oxybutynin Swelling and Other (See Comments)    Swelling    Quinine Other (See Comments)    Platelets dropped- consumptive coagulopathy     Sertraline Other (See Comments)    Can't sleep or urinate   Duloxetine Hcl Other (See Comments)    Night sweats   Morphine And Related Nausea And Vomiting, Swelling and Other (See Comments)    Facial swelling and lips swell, but tolerates immediate-release oxycodone as well as hydrocodone    Oxycontin [Oxycodone Hcl] Swelling and Other (See Comments)    Lips swell, but tolerates immediate-release oxycodone as well as hydrocodone   Sertraline Hcl Other (See Comments)    Urinary problems and insomnia   Tape Other (See Comments)    SKIN IS FRAGILE- CERTAIN BANDAGES TEAR OFF THE SKIN   Voltaren [Diclofenac Sodium] Other (See Comments)    Elevated liver enzymes   Gabapentin Other (See Comments)         Patient Measurements: Height: 6' (182.9 cm) Weight: 120.5 kg (265 lb 10.5 oz) IBW/kg (Calculated) : 77.6 Heparin Dosing Weight: 104kg  Vital Signs: Temp: 97.6 F (36.4 C) (08/17 2128) Temp Source: Oral (08/17 2128) BP: 133/85 (08/17 2128) Pulse Rate: 72 (08/17 2128)  Labs: Recent Labs    10/05/21 1947 10/06/21 1330 10/07/21 0223  HGB 13.0 12.4* 11.9*  HCT 42.0 38.4* 36.2*  PLT 433* 502* 438*  APTT  --  35 56*  LABPROT  --  16.3*  --   INR  --  1.3*  --   HEPARINUNFRC  --   --  >1.10*  CREATININE 1.16 1.23 1.23     Estimated Creatinine  Clearance: 68.5 mL/min (by C-G formula based on SCr of 1.23 mg/dL).   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  [place] with DVT, plts 502.  Heparin level >1.1 (affected by Eliquis), aPTT 56 sec (subtherapeutic) on infusion at 1700 units/hr. No issues with line or bleeding reported per RN.  Goal of Therapy:  Heparin level 0.3-0.7 units/ml aPTT 66-102 seconds Monitor platelets by anticoagulation protocol: Yes   Plan:  Increase heparin gtt 1900 units/hr F/u 8 hour aPTT/HL F/u any intervention and long term AC plan s/p  Sherlon Handing, PharmD, BCPS Please see amion for complete clinical pharmacist phone list 10/07/2021 4:13 AM

## 2021-10-07 NOTE — Consult Note (Signed)
Chief Complaint: Deep vein thrombosis  Referring Physician(s): Esau Grew  Supervising Physician: Michaelle Birks  Patient Status: [facility] - In-pt  History of Present Illness: Derek [name] is a 76 y.o. 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   Pt was mistakenly taken off of eliquis a couple of months ago.   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   Despite being back on Eliquis, his swelling in the right leg has worsened.    We are asked to evaluate for mechanical thrombectomy.  He is NPO. He is currently on heparin drip and feels his leg has improved some since that was started.  He has an IVC filter in place which was placed by Dr. Laurence Ferrari back on 11/25/2020.    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" no pulmonary md, sees dr [name]   Factor V Leiden mutation ([place])    "never had blood clots"   GERD (gastroesophageal reflux disease)    Hiatal hernia    History of kidney stones    multiple kidney stones-not a problem at present   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, pT1 pNX) no further treatment   History of panic attacks    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    Past Surgical History:  Procedure Laterality Date   ANAL FISTULECTOMY N/A 04/01/2013   Procedure: EXAM UNDER ANESTHESIA WITH ANAL FISSURectomy, sphincterotomy and internal hemorrhoidectomy;  Surgeon: Harl Bowie, MD;  Location: [place];  Service: General;  Laterality: N/A;   APPLICATION OF WOUND VAC N/A 05/16/2015   Procedure: APPLICATION OF WOUND VAC;  Surgeon: Armandina Gemma, MD;  Location: [facility];  Service: General;  Laterality: N/A;   APPLICATION OF WOUND VAC N/A 05/20/2015   Procedure: EXHANGE OF ABDOMINAL  WOUND VAC DRESSING;  Surgeon: Armandina Gemma, MD;  Location: [facility];  Service: General;  Laterality: N/A;   COLONOSCOPY  CYSTOSCOPY N/A 05/23/2021   Procedure: CYSTOSCOPY;  Surgeon: Franchot Gallo, MD;  Location: [facility];  Service: Urology;  Laterality: N/A;   CYSTOSCOPY WITH INSERTION OF UROLIFT N/A 10/12/2016   Procedure: CYSTOSCOPY WITH INSERTION OF UROLIFT;  Surgeon: Franchot Gallo, MD;  Location[facility];  Service: Urology;  Laterality: N/A;   EUS N/A 12/31/2014   Procedure: UPPER ENDOSCOPIC ULTRASOUND (EUS) LINEAR;  Surgeon: Milus Banister, MD;  [facility];  Service: Endoscopy;  Laterality: N/A;   HARDWARE REMOVAL Left 2013   left ankle   HEMORRHOIDECTOMY WITH HEMORRHOID BANDING     INCISION AND DRAINAGE ABSCESS N/A 05/14/2015   Procedure: DRAINAGE OF INFECTED PANCREATIC PSEUDOCYST;  Surgeon: Armandina Gemma, MD;   Location[facility];  Service: General;  Laterality: N/A;   INCISION AND DRAINAGE OF WOUND N/A 05/16/2015   Procedure: IRRIGATION AND DEBRIDEMENT WOUND;  Surgeon: Armandina Gemma, MD;  Location: [facility];  Service: General;  Laterality: N/A;   INCISIONAL HERNIA REPAIR N/A 11/12/2015   Procedure: REPAIR INCISIONAL HERNIA WITH MESH;  Surgeon: Armandina Gemma, MD;  Location: [place];  Service: General;  Laterality: N/A;   INGUINAL HERNIA REPAIR Right 1974;  [place] N/A 11/12/2015   Procedure: INSERTION OF MESH;  Surgeon: Armandina Gemma, MD;  Location: [place];  Service: General;  Laterality: N/A;   IR GENERIC HISTORICAL  04/20/2015   IR RADIOLOGIST EVAL & MGMT 04/20/2015 [facility] INTERV RAD   IR IVC FILTER PLMT / S&I Burke Keels GUID/MOD SED  11/25/2020   IR RADIOLOGY PERIPHERAL GUIDED IV START  11/25/2020   IR US GUIDE VASC ACCESS RIGHT  11/25/2020   KNEE ARTHROSCOPY Bilateral right 08-08-2000;  left 11-23-2000   meniscal repair and chondroplasty's   KNEE ARTHROSCOPY  03/01/2012   Procedure: ARTHROSCOPY KNEE;  Surgeon: Gearlean Alf, MD;  Location[facility];  Service: Orthopedics;  Laterality: Left;  WITH SYNOVECTOMY    LAPAROTOMY N/A 05/14/2015   Procedure: EXPLORATORY LAPAROTOMY;  Surgeon: Armandina Gemma, MD;  [facility];  Service: General;  Laterality: N/A;   MASS EXCISION N/A 12/24/2019   Procedure: EXCISION LIP CARCINOMA;  Surgeon: Leta Baptist, MD;  Location: [place];  Service: ENT;  Laterality: N/A;   ORIF LEFT ANKLE FX  08/20/2009   distal tib-fib and malleolus   ROTATOR CUFF REPAIR Bilateral right 1994; left 1993, left 1993   TOTAL KNEE ARTHROPLASTY Right 06/02/2013   Procedure: RIGHT TOTAL KNEE ARTHROPLASTY;  Surgeon: Gearlean Alf, MD;  [facility];  Service: Orthopedics;  Laterality: Right;   TOTAL KNEE ARTHROPLASTY Left 01-31-2008   dr Wynelle Link   TOTAL KNEE REVISION Right 01/23/2018   Procedure: right knee polyethylene revision;  Surgeon: Gaynelle Arabian, MD;  Location[facility];   Service: Orthopedics;  Laterality: Right;  71mn   TRANSURETHRAL RESECTION OF BLADDER TUMOR N/A 05/23/2021   Procedure: TRANSURETHRAL BIOPSY OF BLADDER;  Surgeon: DFranchot Gallo MD;  [facility]  Service: Urology;  Laterality: N/A;   TRANSURETHRAL RESECTION OF PROSTATE N/A 04/21/2019   Procedure: TRANSURETHRAL RESECTION OF THE PROSTATE (TURP);  Surgeon: DFranchot Gallo MD;  [facility]  Service: Urology;  Laterality: N/A;   UMBILICAL HERNIA REPAIR  08-11-1999   dr bNinfa Linden  UPPER GASTROINTESTINAL ENDOSCOPY  sept 2016   WOUND EXPLORATION N/A 09/21/2016   Procedure: WOUND EXPLORATION AND DEBRIDEMENT ABDOMINAL WALL;  Surgeon: GArmandina Gemma MD;  [facility];  Service: General;  Laterality: N/A;    Allergies: Ditropan [oxybutynin], Quinine, Vibra-tab [doxycycline], Voltaren [diclofenac], Zoloft [sertraline], Cymbalta [duloxetine hcl], Morphine and related, Oxycontin [oxycodone hcl], Tape, Voltaren [diclofenac sodium], Zohydro er [employer] bitartrate er], and Neurontin [gabapentin]  Medications: Prior to Admission medications   Medication Sig Start Date End Date Taking? Authorizing Provider  apixaban (ELIQUIS) 5 MG TABS tablet Take 1 tablet (5 mg total) by mouth 2 (two) times daily. 09/27/21     carvedilol (COREG) 25 MG tablet Take 1 tablet by mouth once a day every evening 07/08/21     Dulaglutide (TRULICITY) 3 LG/9.2JJ SOPN Inject 0.5 mLs (3 mg total) into the skin every 7 days. 08/16/21     empagliflozin (JARDIANCE) 25 MG TABS tablet Take 1 tablet (25 mg total) by mouth daily. 08/16/21     fentaNYL (DURAGESIC) 25 MCG/HR Place 1 patch onto the skin every 3 (three) days. 9/41/74   Campbell Stall P, DO  furosemide (LASIX) 20 MG tablet Take 20 mg by mouth daily as needed for edema.    [provider]  insulin aspart (NOVOLOG) 100 UNIT/ML injection USE 100U/DAY SUBCUTANEOUS VIA PUMP 30 DAY(S) Patient taking differently: Inject 5-30  Units into the skin See admin instructions. Inject 5-30 units into the skin three times a day before meals, PER SLIDING SCALE 12/23/19 05/23/21  Jacelyn Pi, MD  Iron-FA-B Cmp-C-Biot-Probiotic (FUSION PLUS) CAPS Take 1 capsule by mouth daily. 03/02/21   Celso Amy, NP  lansoprazole (PREVACID) 30 MG capsule TAKE 1 CAPSULE BY MOUTH ONCE DAILY Patient taking differently: Take 30 mg by mouth daily. 07/08/21     lidocaine (LIDODERM) 5 % Place 1 patch onto the skin daily. Remove & Discard patch within 12 hours or as directed by MD 12/01/20   Cristal Deer, MD  losartan (COZAAR) 50 MG tablet Take 1 tablet by mouth once every evening 07/08/21     magnesium oxide (MAG-OX) 400 MG tablet Take 800 mg by mouth at bedtime. Triple complex    [provider]  nystatin (MYCOSTATIN) 100000 UNIT/ML suspension Use 4-5 mL for Mouth/Throat as directed four times a day 07/08/21     oxybutynin (DITROPAN) 5 MG tablet Take 1 tablet (5 mg total) by mouth every 6 (six) to 8 (eight) hours as needed for urinary urgency/frequency/leakage. 05/26/21     pravastatin (PRAVACHOL) 20 MG tablet Take 1 tablet by mouth twice a week 07/08/21     tamsulosin (FLOMAX) 0.4 MG CAPS capsule take 1 capsule by mouth at bedtime 09/19/21     triamcinolone cream (KENALOG) 0.1 % Apply externally two times a day for 30 days. 10/07/20     UNABLE TO FIND Apply 1 Tube topically as needed. Med Name: [place]    [provider]     Family History  Problem Relation Age of Onset   Bone cancer Father        Jaw   Factor V Leiden deficiency Daughter    Diabetes Maternal Grandmother    Heart disease Paternal Uncle    Colon cancer Neg Hx    Esophageal cancer Neg Hx    Stomach cancer Neg Hx    Rectal cancer Neg Hx     Social History   Socioeconomic History   Marital status: Married    Spouse name: Ivin Booty   Number of children: 2   Years of education: Not on file   Highest education level: Not on file  Occupational  History   Occupation: retired   Tobacco Use  Smoking status: Former    Packs/day: 4.00    Years: 17.00    Total pack years: 68.00    Types: Cigarettes    Quit date: 02/21/1975    Years since quitting: 46.6   Smokeless tobacco: Never  Vaping Use   Vaping Use: Never used  Substance and Sexual Activity   Alcohol use: No    Alcohol/week: 0.0 standard drinks of alcohol   Drug use: No   Sexual activity: Yes  Other Topics Concern   Not on file  Social History Narrative   Married, retired Scientist, product/process development   1 son 1 daughter   2 caffeine drinks/day   10/18/2014   Social Determinants of Health   Financial Resource Strain: Not on file  Food Insecurity: No Food Insecurity (12/22/2019)   Hunger Vital Sign    Worried About Running Out of Food in the Last Year: Never true    [place] in the Last Year: Never true  Transportation Needs: No Transportation Needs (12/22/2019)   PRAPARE - Hydrologist (Medical): No    Lack of Transportation (Non-Medical): No  Physical Activity: Not on file  Stress: Not on file  Social Connections: Not on file     Review of Systems: A 12 point ROS discussed and pertinent positives are indicated in the HPI above.  All other systems are negative.  Review of Systems  Vital Signs: BP 106/65 (BP Location: Left Arm)   Pulse 68   Temp 97.6 F (36.4 C) (Oral)   Resp 17   Ht 6' (1.829 m)   Wt 264 lb 8.8 oz (120 kg)   SpO2 96%   BMI 35.88 kg/m   Physical Exam Vitals reviewed.  Constitutional:      Appearance: Normal appearance.  HENT:     Head: Normocephalic and atraumatic.  Eyes:     Extraocular Movements: Extraocular movements intact.  Cardiovascular:     Rate and Rhythm: Normal rate and regular rhythm.  Pulmonary:     Effort: Pulmonary effort is normal. No respiratory distress.     Breath sounds: Normal breath sounds.  Abdominal:     General: There is no distension.     Palpations: Abdomen is soft.      Tenderness: There is no abdominal tenderness.  Musculoskeletal:        General: Normal range of motion.     Cervical back: Normal range of motion.     Comments: Right lower extremity with 2 + Edema.  Skin:    General: Skin is warm and dry.  Neurological:     General: No focal deficit present.     Mental Status: He is alert and oriented to person, place, and time.  Psychiatric:        Mood and Affect: Mood normal.        Behavior: Behavior normal.        Thought Content: Thought content normal.        Judgment: Judgment normal.     Imaging: CT VENOGRAM ABD/PELVIS/LOWER EXT BILAT  Result Date: 10/06/2021 CLINICAL DATA:  Right leg pain with known occlusive DVT. EXAM: CT VENOGRAM ABDOMEN AND PELVIS AND LOWER EXTREMITY BILATERAL TECHNIQUE: CT imaging of the abdomen pelvis and bilateral lower extremities performed following administration of intravenous contrast. RADIATION DOSE REDUCTION: This exam was performed according to the departmental dose-optimization program which includes automated exposure control, adjustment of the mA and/or kV according to patient size and/or use of iterative reconstruction  technique. CONTRAST:  173m OMNIPAQUE IOHEXOL 350 MG/ML SOLN COMPARISON:  Right lower extremity DVT ultrasound 10/04/2021 FINDINGS: Lower chest: Large sliding hiatal hernia. Mild dependent atelectasis. No focal opacity. Hepatobiliary: No focal liver abnormality is seen. No gallstones, gallbladder wall thickening, or biliary dilatation. Pancreas: Surgical changes of prior distal pancreatectomy. The remaining pancreatic tissue demonstrates no evidence of mass or inflammatory changes. Spleen: Surgically absent. Adrenals/Urinary Tract: Unremarkable adrenal glands. No hydronephrosis, nephrolithiasis or enhancing renal mass. No evidence of hydronephrosis. The ureters are normal. Diffuse mild thickening of the bladder wall with a right-sided Peri ureteral diverticulum. Stomach/Bowel: No focal bowel wall  thickening or evidence of obstruction. Vascular/Lymphatic: Scattered atherosclerotic plaque along the abdominal aorta. Mild tortuosity. No evidence of aneurysm or dissection. Venous structures are well evaluated. The right common femoral vein is patent as is the saphenofemoral junction. DVT begins proximally in the right femoral vein. Reproductive: Surgical changes of prior TURP. Other: No abdominal wall hernia or abnormality. No abdominopelvic ascites. Musculoskeletal: No acute fracture or aggressive appearing lytic or blastic osseous lesion. Multilevel degenerative disc disease. IVC: A Bard Denali IVC filter is present within the infrarenal IVC. There is a small amount of thrombus within the nose cone of the catheter. The IVC remains patent. Portal and mesenteric veins: Widely patent and unremarkable. Bilateral iliac veins: Both iliac veins are patent without evidence of venous compression or obstruction. Right lower extremity: The right common femoral vein is widely patent. The saphenofemoral junction is patent. No evidence of thrombus in the profunda femoral vein. Thrombus begins in the proximal femoral vein in the upper thigh and extends throughout the femoral vein and into the popliteal vein. Evaluation of the popliteal vein is slightly limited due to knee arthroplasty prosthesis. The calf veins appear largely patent. Left lower extremity: No evidence of deep venous thrombosis in the left lower extremity. IMPRESSION: 1. Positive for DVT within the right femoral vein beginning at the vein origin in the upper thigh and extending through the popliteal vein. 2. No evidence of extension into the iliac venous system or IVC. 3. No evidence of iliac vein compression physiology. 4. Bard Denali IVC filter present in the infrarenal IVC with a small amount of nonocclusive thrombus in the nose cone. 5. Moderately large sliding hiatal hernia. 6.  Aortic Atherosclerosis (ICD10-I70.0). 7. Surgical changes of prior distal  pancreatectomy and splenectomy. 8. Additional ancillary findings as above. Electronically Signed   By: HJacqulynn CadetM.D.   On: 10/06/2021 14:59   UK[place]Venous Img Lower Unilateral Right (DVT)  Result Date: 10/04/2021 CLINICAL DATA:  Right lower extremity swelling. EXAM: Right LOWER EXTREMITY VENOUS DOPPLER ULTRASOUND TECHNIQUE: Gray-scale sonography with graded compression, as well as color Doppler and duplex ultrasound were performed to evaluate the lower extremity deep venous systems from the level of the common femoral vein and including the common femoral, femoral, profunda femoral, popliteal and calf veins including the posterior tibial, peroneal and gastrocnemius veins when visible. The superficial great saphenous vein was also interrogated. Spectral Doppler was utilized to evaluate flow at rest and with distal augmentation maneuvers in the common femoral, femoral and popliteal veins. COMPARISON:  September 12, 2021. FINDINGS: Contralateral Common Femoral Vein: Respiratory phasicity is normal and symmetric with the symptomatic side. No evidence of thrombus. Normal compressibility. Common Femoral Vein: No evidence of thrombus. Normal compressibility, respiratory phasicity and response to augmentation. Saphenofemoral Junction: No evidence of thrombus. Normal compressibility and flow on color Doppler imaging. Profunda Femoral Vein: No evidence of thrombus. Normal compressibility and  flow on color Doppler imaging. Femoral Vein: Noncompressible with no flow consistent with occlusive thrombus. Popliteal Vein: Noncompressible with no flow consistent with occlusive thrombus. Calf Veins: Posterior tibial and peroneal veins are noncompressible with no flow consistent with occlusive thrombus. Superficial Great Saphenous Vein: No evidence of thrombus. Normal compressibility. Venous Reflux:  None. Other Findings:  None. IMPRESSION: Occlusive deep venous thrombosis is noted in the right femoral, popliteal, posterior tibial  and peroneal veins. These results will be called to the ordering clinician or representative by the Radiologist Assistant, and communication documented in the PACS or zVision Dashboard. Electronically Signed   By: Marijo Conception M.D.   On: 10/04/2021 11:36   US Venous Img Lower Unilateral Right  Result Date: 09/12/2021 CLINICAL DATA:  Known deep venous thrombosis in right leg without improvement after blood thinners. EXAM: RIGHT LOWER EXTREMITY VENOUS DOPPLER ULTRASOUND TECHNIQUE: Gray-scale sonography with compression, as well as color and duplex ultrasound, were performed to evaluate the deep venous system(s) from the level of the common femoral vein through the popliteal and proximal calf veins. COMPARISON:  09/02/2021 FINDINGS: VENOUS Nonocclusive thrombus within the right common femoral vein. Occlusive thrombus within the right femoral vein. Nonocclusive thrombus within the right popliteal vein. Normal findings within the profunda femoral vein and saphenofemoral junction. Calf veins not visualized. Limited views of the contralateral common femoral vein are unremarkable. OTHER None. Limitations: none IMPRESSION: Thrombus within the right common femoral, femoral, popliteal veins as detailed above. Electronically Signed   By: Abigail Miyamoto M.D.   On: 09/12/2021 21:25    Labs:  CBC: Recent Labs    10/05/21 1529 10/05/21 1947 10/06/21 1330 10/07/21 0223  WBC 6.9 8.2 6.8 8.7  HGB 12.5* 13.0 12.4* 11.9*  HCT 38.9* 42.0 38.4* 36.2*  PLT 468* 433* 502* 438*    COAGS: Recent Labs    11/25/20 0511 10/06/21 1330 10/07/21 0223  INR 1.3* 1.3*  --   APTT  --  35 56*    BMP: Recent Labs    10/05/21 1529 10/05/21 1947 10/06/21 1330 10/07/21 0223  NA 135 137 134* 134*  K 4.3 5.2* 4.3 4.3  CL 103 107 104 105  CO2 24 20* 22 23  GLUCOSE 160* 119* 236* 168*  BUN '18 16 18 15  '$ CALCIUM 11.0* 10.7* 9.9 9.9  CREATININE 1.16 1.16 1.23 1.23  GFRNONAA >60 >60 >60 >60    LIVER FUNCTION  TESTS: Recent Labs    11/27/20 0924 03/02/21 1046 09/21/21 1350 10/05/21 1529  BILITOT 0.5 0.5 0.4 0.3  AST 15 11* 19 13*  ALT '16 11 21 14  '$ ALKPHOS 75 89 78 93  PROT 6.8 6.4* 7.0 7.3  ALBUMIN 3.8 3.9 4.1 4.1    TUMOR MARKERS: No results for input(s): "AFPTM", "CEA", "CA199", "CHROMGRNA" in the last 8760 hours.  Assessment and Plan:  Right lower extremity deep vein thrombosis.  Will proceed with right leg venogram with mechanical thrombectomy today by Dr. Maryelizabeth Kaufmann  Risks and benefits of right leg  mechanical thrombecomy were discussed with the patient including, but not limited to bleeding, possible life threatening bleeding and need for blood product transfusion, vascular injury, pulmonary embolism, stroke, contrast induced renal failure, limb loss, infection or even death.  This interventional procedure involves the use of X-rays and because of the nature of the planned procedure, it is possible that we will have prolonged use of X-ray fluoroscopy.  Potential radiation risks to you include (but are not limited to) the following: -  A slightly elevated risk for cancer  several years later in life. This risk is typically less than 0.5% percent. This risk is low in comparison to the normal incidence of human cancer, which is 33% for women and 50% for men according to the [employer]. - Radiation induced injury can include skin redness, resembling a rash, tissue breakdown / ulcers and hair loss (which can be temporary or permanent).   The likelihood of either of these occurring depends on the difficulty of the procedure and whether you are sensitive to radiation due to previous procedures, disease, or genetic conditions.   IF your procedure requires a prolonged use of radiation, you will be notified and given written instructions for further action.  It is your responsibility to monitor the irradiated area for the 2 weeks following the procedure and to notify your  physician if you are concerned that you have suffered a radiation induced injury.    All of the patient's questions were answered, patient is agreeable to proceed.  Consent signed and in chart.  Thank you for allowing our service to participate in SHAUNAK KREIS 's care.  Electronically Signed: Murrell Redden, PA-C   10/07/2021, 8:56 AM      I spent a total of    25 Minutes in face to face in clinical consultation, greater than 50% of which was counseling/coordinating care for right leg venous thrombectomy

## 2021-10-07 NOTE — Progress Notes (Addendum)
[facility] for heparin Indication: DVT  Allergies  Allergen Reactions   Ditropan [Oxybutynin] Swelling and Other (See Comments)   Quinine Other (See Comments)    Platelets dropped- consumptive coagulopathy     Vibra-Tab [Doxycycline] Other (See Comments)    Epistaxis     Voltaren [Diclofenac] Swelling and Other (See Comments)    Elevated liver enzymes Lip swelling    Zoloft [Sertraline] Other (See Comments)    Insomnia Acute urinary retention   Cymbalta [Duloxetine Hcl] Other (See Comments)    Night sweats   Morphine And Related Nausea And Vomiting, Swelling and Other (See Comments)    Facial and lip swelling OK to use IR form of hydrocodone and oxycodone   Oxycontin [Oxycodone Hcl] Swelling and Other (See Comments)    Lip swelling OK to use IR form   Tape Other (See Comments)    Skin tears easily   Voltaren [Diclofenac Sodium] Other (See Comments)    Elevated liver enzymes   Zohydro Er [Hydrocodone Bitartrate Er] Swelling    Facial swelling OK to use IR form   Neurontin [Gabapentin] Other (See Comments)    Unknown reaction     Patient Measurements: Height: 6' (182.9 cm) Weight: 120 kg (264 lb 8.8 oz) IBW/kg (Calculated) : 77.6 Heparin Dosing Weight: 104kg  Vital Signs: Temp: 97.6 F (36.4 C) (08/18 1447) Temp Source: Oral (08/18 1447) BP: 111/92 (08/18 1447) Pulse Rate: 68 (08/18 0415)  Labs: Recent Labs    10/05/21 1947 10/06/21 1330 10/07/21 0223 10/07/21 1221  HGB 13.0 12.4* 11.9*  --   HCT 42.0 38.4* 36.2*  --   PLT 433* 502* 438*  --   APTT  --  35 56* 61*  LABPROT  --  16.3*  --   --   INR  --  1.3*  --   --   HEPARINUNFRC  --   --  >1.10*  --   CREATININE 1.16 1.23 1.23  --      Estimated Creatinine Clearance: 68.4 mL/min (by C-G formula based on SCr of 1.23 mg/dL).   Assessment: 43 YOM presenting with leg pain, hx V Leiden and DVTs on Eliquis PTA with last dose taken 8/17 AM, also with IVC  filter.  [place] with DVT, plts 502. -aPTT= 61 on heparin 1900 units/hr  Goal of Therapy:  Heparin level 0.3-0.7 units/ml aPTT 66-102 seconds Monitor platelets by anticoagulation protocol: Yes   Plan:  -Increase heparin to 2100 units/hr -will follow plans post mechanical thrombectomy   Hildred Laser, PharmD Clinical Pharmacist **Pharmacist phone directory can now be found on [email or web address] ([email or web address]).  Listed under [facility].

## 2021-10-07 NOTE — TOC Benefit Eligibility Note (Signed)
Patient Teacher, English as a foreign language completed.    The patient is currently admitted and upon discharge could be taking enoxaparin (Lovenox) 100 mg/ml injection.  The current 15 day co-pay is $24.58.   The patient is insured through [place], [place] Patient [place] Patient Advocate Team Direct Number: [phone]  Fax: [phone]

## 2021-10-07 NOTE — Progress Notes (Signed)
[facility] for heparin Indication: DVT  Allergies  Allergen Reactions   Ditropan [Oxybutynin] Swelling and Other (See Comments)   Quinine Other (See Comments)    Platelets dropped- consumptive coagulopathy     Vibra-Tab [Doxycycline] Other (See Comments)    Epistaxis     Voltaren [Diclofenac] Swelling and Other (See Comments)    Elevated liver enzymes Lip swelling    Zoloft [Sertraline] Other (See Comments)    Insomnia Acute urinary retention   Cymbalta [Duloxetine Hcl] Other (See Comments)    Night sweats   Morphine And Related Nausea And Vomiting, Swelling and Other (See Comments)    Facial and lip swelling OK to use IR form of hydrocodone and oxycodone   Oxycontin [Oxycodone Hcl] Swelling and Other (See Comments)    Lip swelling OK to use IR form   Tape Other (See Comments)    Skin tears easily   Voltaren [Diclofenac Sodium] Other (See Comments)    Elevated liver enzymes   Zohydro Er [Hydrocodone Bitartrate Er] Swelling    Facial swelling OK to use IR form   Neurontin [Gabapentin] Other (See Comments)    Unknown reaction     Patient Measurements: Height: 6' (182.9 cm) Weight: 120 kg (264 lb 8.8 oz) IBW/kg (Calculated) : 77.6 Heparin Dosing Weight: 104kg  Vital Signs: Temp: 97.7 F (36.5 C) (08/18 1937) Temp Source: Oral (08/18 1937) BP: 90/73 (08/18 1937)  Labs: Recent Labs    10/05/21 1947 10/05/21 1947 10/06/21 1330 10/07/21 0223 10/07/21 1221 10/07/21 2114  HGB 13.0  --  12.4* 11.9*  --   --   HCT 42.0  --  38.4* 36.2*  --   --   PLT 433*  --  502* 438*  --   --   APTT  --    < > 35 56* 61* 100*  LABPROT  --   --  16.3*  --   --   --   INR  --   --  1.3*  --   --   --   HEPARINUNFRC  --   --   --  >1.10*  --   --   CREATININE 1.16  --  1.23 1.23  --   --    < > = values in this interval not displayed.    Estimated Creatinine Clearance: 68.4 mL/min (by C-G formula based on SCr of 1.23  mg/dL).   Assessment: 54 YOM presenting with leg pain, hx V Leiden and DVTs on Eliquis PTA with last dose taken 8/17 AM, also with IVC filter.  [place] with DVT, plts 502.  Update: aPTT 100 after rate change to 2100 untis/hr.   Goal of Therapy:  Heparin level 0.3-0.7 units/ml aPTT 66-102 seconds Monitor platelets by anticoagulation protocol: Yes   Plan:  -Continue heparin to 2100 units/hr Daily HL and CBC -will follow plans post mechanical thrombectomy   Sloan Leiter, PharmD, BCPS, BCCCP Clinical Pharmacist Please refer to [facility] for [place] numbers 10/07/2021, 10:12 PM

## 2021-10-07 NOTE — Progress Notes (Signed)
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 [name] recommended thrombectomy by IR, start heparin drip with a plan to transition to Coumadin.     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 Small amount of clot in his IVC but filter remains patent on CT today. Clinically: pt doesn't seem to have phlegmasia cerulea dolens at this point: Easily doplerable (and even palpable) DP pulse in foot No skin discoloration in that leg, no mottling, etc Plan for pain control, continue heparin, IR for right leg venogram with mechanical thrombectomy  Management per hematology/oncology and IR  Chronic back pain No longer on long term opoid therapy (pt had very bad experience with dilaudid, almost committed suicide, etc).  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 Following with hemeonc DVT this time around believed to be secondary to stopping eliquis for 2 months prior to onset.  And eliquis not really working for patient.   : Body mass index is 35.88 kg/m.Marland Kitchen   Meet obesity criteria  .   I have Reviewed nursing notes, Vitals, pain scores, I/o's, Lab results and  imaging results since pt's last encounter, details please see discussion above  I ordered the following labs:  Unresulted Labs (From admission, onward)     Start     Ordered   10/08/21 0500  Heparin level (unfractionated)  Daily at 5am,   R      10/06/21 2319   10/08/21 0500  APTT  Daily at 5am,   R      10/06/21 2319   10/08/21 0500  CBC with Differential/Platelet  Tomorrow morning,   R       Question:  Specimen collection method  Answer:  Lab=Lab collect   10/07/21 2242   10/08/21 3532  Basic metabolic panel  Tomorrow morning,   R       Question:  Specimen collection method  Answer:  Lab=Lab collect   10/07/21 2242             DVT prophylaxis:   Currently on heparin drip   Code Status:   Code Status: Full Code  Family Communication: Wife at bedside Disposition:     Dispo: The patient is from: Home              Anticipated d/c is to: Home              Anticipated d/c date is: . 48hrs, need thrombectomy, need to bridge to Coumadin  Antimicrobials:   Anti-infectives (From admission, onward)    None  Objective: Vitals:   10/06/21 2128 10/07/21 0415 10/07/21 1447 10/07/21 1937  BP: 133/85 106/65 (!) 111/92 90/73  Pulse: 72 68    Resp: '17 17 18 17  '$ Temp: 97.6 F (36.4 C) 97.6 F (36.4 C) 97.6 F (36.4 C) 97.7 F (36.5 C)  TempSrc: Oral Oral Oral Oral  SpO2: 96% 96%    Weight: 120.5 kg 120 kg    Height: 6' (1.829 m)       Intake/Output Summary (Last 24 hours) at 10/07/2021 2242 Last data filed at 10/07/2021 0300 Gross per 24 hour  Intake 168.15 ml  Output --  Net 168.15 ml   Filed Weights   10/06/21 1407 10/06/21 2128 10/07/21 0415  Weight: 122 kg 120.5 kg 120 kg    Examination:  General exam: alert, awake, communicative,calm, NAD Respiratory system:  Respiratory effort normal. Cardiovascular system:  RRR.  Gastrointestinal  system: Abdomen is nondistended, soft and nontender.  Normal bowel sounds heard. Central nervous system: Alert and oriented. No focal neurological deficits. Extremities:  right lower extremity edema Skin: No rashes, lesions or ulcers Psychiatry: Judgement and insight appear normal. Mood & affect appropriate.     Data Reviewed: I have personally reviewed  labs and visualized  imaging studies since the last encounter and formulate the plan        Scheduled Meds:  carvedilol  25 mg Oral QHS   fentaNYL  1 patch Transdermal Q72H   insulin aspart  0-9 Units Subcutaneous Q4H   lidocaine  1 patch Transdermal Q24H   tamsulosin  0.4 mg Oral QHS   Continuous Infusions:  heparin 2,100 Units/hr (10/07/21 2235)     LOS: 1 day     Florencia Reasons, MD PhD FACP [facility]  Available via Epic secure chat 7am-7pm for nonurgent issues Please page for urgent issues To page the attending provider between 7A-7P or the covering provider during after hours 7P-7A, please log into the web site [email or web address] and access using universal [place] password for that web site. If you do not have the password, please call the hospital operator.    10/07/2021, 10:42 PM

## 2021-10-07 NOTE — Progress Notes (Signed)
Mobility Specialist Progress Note    10/07/21 1452  Mobility  Activity Contraindicated/medical hold   RN advised before procedure. Will f/u as schedule permits.  Hildred Alamin Mobility Specialist

## 2021-10-08 ENCOUNTER — Inpatient Hospital Stay (HOSPITAL_BASED_OUTPATIENT_CLINIC_OR_DEPARTMENT_OTHER): Payer: HMO

## 2021-10-08 DIAGNOSIS — I82431 Acute embolism and thrombosis of right popliteal vein: Secondary | ICD-10-CM

## 2021-10-08 DIAGNOSIS — I82401 Acute embolism and thrombosis of unspecified deep veins of right lower extremity: Secondary | ICD-10-CM | POA: Diagnosis not present

## 2021-10-08 HISTORY — PX: IR THROMBECT VENO MECH MOD SED: IMG2300

## 2021-10-08 HISTORY — PX: IR US GUIDE VASC ACCESS RIGHT: IMG2390

## 2021-10-08 LAB — CBC WITH DIFFERENTIAL/PLATELET
Abs Immature Granulocytes: 0.02 10*3/uL (ref 0.00–0.07)
Basophils Absolute: 0.1 10*3/uL (ref 0.0–0.1)
Basophils Relative: 1 %
Eosinophils Absolute: 0.4 10*3/uL (ref 0.0–0.5)
Eosinophils Relative: 5 %
HCT: 36.4 % — ABNORMAL LOW (ref 39.0–52.0)
Hemoglobin: 11.7 g/dL — ABNORMAL LOW (ref 13.0–17.0)
Immature Granulocytes: 0 %
Lymphocytes Relative: 43 %
Lymphs Abs: 3.7 10*3/uL (ref 0.7–4.0)
MCH: 29.5 pg (ref 26.0–34.0)
MCHC: 32.1 g/dL (ref 30.0–36.0)
MCV: 91.7 fL (ref 80.0–100.0)
Monocytes Absolute: 1.2 10*3/uL — ABNORMAL HIGH (ref 0.1–1.0)
Monocytes Relative: 14 %
Neutro Abs: 3.1 10*3/uL (ref 1.7–7.7)
Neutrophils Relative %: 37 %
Platelets: 431 10*3/uL — ABNORMAL HIGH (ref 150–400)
RBC: 3.97 MIL/uL — ABNORMAL LOW (ref 4.22–5.81)
RDW: 14 % (ref 11.5–15.5)
WBC: 8.5 10*3/uL (ref 4.0–10.5)
nRBC: 0 % (ref 0.0–0.2)

## 2021-10-08 LAB — HEPARIN LEVEL (UNFRACTIONATED): Heparin Unfractionated: 1.07 IU/mL — ABNORMAL HIGH (ref 0.30–0.70)

## 2021-10-08 LAB — BASIC METABOLIC PANEL
Anion gap: 9 (ref 5–15)
BUN: 13 mg/dL (ref 8–23)
CO2: 21 mmol/L — ABNORMAL LOW (ref 22–32)
Calcium: 10 mg/dL (ref 8.9–10.3)
Chloride: 106 mmol/L (ref 98–111)
Creatinine, Ser: 1.19 mg/dL (ref 0.61–1.24)
GFR, Estimated: >60 mL/min (ref >60)
Glucose, Bld: 122 mg/dL — ABNORMAL HIGH (ref 70–99)
Potassium: 4 mmol/L (ref 3.5–5.1)
Sodium: 136 mmol/L (ref 135–145)

## 2021-10-08 LAB — GLUCOSE, CAPILLARY
Glucose-Capillary: 115 mg/dL — ABNORMAL HIGH (ref 70–99)
Glucose-Capillary: 150 mg/dL — ABNORMAL HIGH (ref 70–99)
Glucose-Capillary: 151 mg/dL — ABNORMAL HIGH (ref 70–99)
Glucose-Capillary: 150 mg/dL — ABNORMAL HIGH (ref 70–99)
Glucose-Capillary: 210 mg/dL — ABNORMAL HIGH (ref 70–99)

## 2021-10-08 LAB — APTT: aPTT: 100 seconds — ABNORMAL HIGH (ref 24–36)

## 2021-10-08 LAB — POCT ACTIVATED CLOTTING TIME
Activated Clotting Time: 173 seconds
Activated Clotting Time: 197 seconds

## 2021-10-08 MED ORDER — FENTANYL CITRATE (PF) 100 MCG/2ML IJ SOLN
INTRAMUSCULAR | Status: AC | PRN
  Administered 2021-10-08 (×4): 50 ug via INTRAVENOUS

## 2021-10-08 MED ORDER — CEFAZOLIN SODIUM-DEXTROSE 2-4 GM/100ML-% IV SOLN
INTRAVENOUS | Status: AC | PRN
  Administered 2021-10-08: 2 g via INTRAVENOUS

## 2021-10-08 MED ORDER — CEFAZOLIN SODIUM-DEXTROSE 2-4 GM/100ML-% IV SOLN
INTRAVENOUS | Status: AC
  Filled 2021-10-08: qty 100

## 2021-10-08 MED ORDER — IOHEXOL 300 MG/ML  SOLN
100.0000 mL | Freq: Once | INTRAMUSCULAR | Status: AC | PRN
  Administered 2021-10-08: 50 mL via INTRAVENOUS

## 2021-10-08 MED ORDER — HYDROMORPHONE HCL 1 MG/ML IJ SOLN
INTRAMUSCULAR | Status: AC
  Filled 2021-10-08: qty 2

## 2021-10-08 MED ORDER — FENTANYL CITRATE (PF) 100 MCG/2ML IJ SOLN
INTRAMUSCULAR | Status: AC
  Filled 2021-10-08: qty 6

## 2021-10-08 MED ORDER — HEPARIN SODIUM (PORCINE) 1000 UNIT/ML IJ SOLN
INTRAMUSCULAR | Status: AC
  Filled 2021-10-08: qty 10

## 2021-10-08 MED ORDER — HEPARIN SODIUM (PORCINE) 1000 UNIT/ML IJ SOLN
INTRAMUSCULAR | Status: AC | PRN
  Administered 2021-10-08: 3000 [IU] via INTRAVENOUS

## 2021-10-08 MED ORDER — MIDAZOLAM HCL 2 MG/2ML IJ SOLN
INTRAMUSCULAR | Status: AC | PRN
  Administered 2021-10-08: 1 mg via INTRAVENOUS
  Administered 2021-10-08: .5 mg via INTRAVENOUS
  Administered 2021-10-08: 1 mg via INTRAVENOUS
  Administered 2021-10-08: 1.5 mg via INTRAVENOUS

## 2021-10-08 MED ORDER — LIDOCAINE HCL 1 % IJ SOLN
INTRAMUSCULAR | Status: AC
  Filled 2021-10-08: qty 20

## 2021-10-08 MED ORDER — POLYETHYLENE GLYCOL 3350 17 G PO PACK
17.0000 g | PACK | Freq: Every day | ORAL | Status: DC
  Filled 2021-10-08: qty 1

## 2021-10-08 MED ORDER — MIDAZOLAM HCL 2 MG/2ML IJ SOLN
INTRAMUSCULAR | Status: AC
  Filled 2021-10-08: qty 6

## 2021-10-08 NOTE — Progress Notes (Signed)
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  Dr. [name] recommended thrombectomy by IR-continue with heparin drip.  Plan to transition to Arixtra if patient able to afford it.   Patient underwent right lower extremity venogram and subsequently catheter directed thrombectomy by IR on 8/19.     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 [name] following recommended Arixtra at discharge or  Lovenox.  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 [name]     Estimated body mass index is 35.88 kg/m as calculated from the following:   Height as of this encounter: 6' (1.829 m).   Weight as of this encounter: 120 kg.   DVT prophylaxis: Heparin  Code Status: Full code Family Communication: Wife at bedside.   Disposition Plan:  Status is: Inpatient Remains inpatient appropriate because: management of DVT    Consultants:  IR Dr [name].   Procedures:  Venogram, thrombectomy   Antimicrobials:    Subjective: He is alert, came from procedure. Report chronic back pain.    Objective: Vitals:   10/07/21 0415 10/07/21 1447 10/07/21 1937 10/07/21 2332  BP: 106/65 (!) 111/92 90/73 123/78  Pulse: 68     Resp: '17 18 17   '$ Temp: 97.6 F (36.4 C) 97.6 F (36.4 C) 97.7 F (36.5 C)   TempSrc: Oral Oral Oral   SpO2: 96%     Weight: 120 kg     Height:       No intake or output data in the 24 hours ending 10/08/21 0751 Filed Weights   10/06/21 1407 10/06/21 2128 10/07/21 0415  Weight: 122 kg 120.5 kg 120 kg    Examination:  General exam: Appears calm and comfortable  Respiratory system: Clear to auscultation. Respiratory effort normal. Cardiovascular system: S1 & S2 heard, RRR. No JVD, murmurs, rubs, gallops or clicks. No pedal edema. Gastrointestinal system: Abdomen is nondistended, soft and nontender. No organomegaly or masses felt. Normal bowel sounds heard. Central nervous system: Alert and oriented.  Extremities: Symmetric 5 x 5 power.    Data Reviewed: I have personally reviewed following labs and imaging studies  CBC: Recent Labs  Lab 10/05/21 1529 10/05/21 1947 10/06/21 1330 10/07/21 0223 10/08/21 0245  WBC 6.9 8.2 6.8 8.7 8.5  NEUTROABS 3.1  --   --   --  3.1  HGB 12.5* 13.0 12.4* 11.9* 11.7*  HCT 38.9* 42.0 38.4* 36.2* 36.4*  MCV 91.3 97.2 90.6 91.4 91.7  PLT 468* 433* 502* 438* 662*   Basic Metabolic Panel: Recent Labs  Lab 10/05/21 1529 10/05/21 1947 10/06/21 1330 10/07/21 0223 10/08/21 0245  NA 135 137 134* 134* 136  K 4.3 5.2* 4.3 4.3 4.0  CL 103 107 104 105 106  CO2 24 20* 22 23 21*  GLUCOSE 160* 119* 236* 168* 122*  BUN '18 16 18 15 13  '$ CREATININE 1.16 1.16 1.23 1.23 1.19  CALCIUM 11.0* 10.7* 9.9 9.9 10.0   GFR: Estimated Creatinine  Clearance: 70.7 mL/min (by C-G formula based on SCr of 1.19 mg/dL). Liver Function Tests: Recent Labs  Lab 10/05/21 1529  AST 13*  ALT 14  ALKPHOS 93  BILITOT 0.3  PROT 7.3  ALBUMIN 4.1   No results for input(s): "LIPASE", "AMYLASE" in the last 168 hours. No results for input(s): "AMMONIA" in the last 168 hours. Coagulation Profile: Recent Labs  Lab 10/06/21 1330  INR 1.3*   Cardiac Enzymes: No results for input(s): "CKTOTAL", "CKMB", "CKMBINDEX", "TROPONINI" in the last 168 hours. BNP (last 3 results) No results for input(s): "PROBNP" in the last 8760 hours. HbA1C: Recent Labs    10/07/21 0223  HGBA1C 8.3*   CBG: Recent Labs  Lab 10/07/21 0801 10/07/21 1139 10/07/21 2250 10/08/21 0246 10/08/21 0658  GLUCAP 135* 118* 155* 115* 150*   Lipid Profile: No results for input(s): "CHOL", "HDL", "LDLCALC", "TRIG", "CHOLHDL", "LDLDIRECT" in the last 72 hours. Thyroid Function Tests: No results for input(s): "TSH", "T4TOTAL", "FREET4", "T3FREE", "THYROIDAB" in the last 72 hours. Anemia Panel: No results for input(s): "VITAMINB12", "FOLATE", "FERRITIN", "TIBC", "IRON", "RETICCTPCT" in the last 72 hours. Sepsis Labs: No results for input(s): "PROCALCITON", "LATICACIDVEN" in the last 168 hours.  No results found for this or any previous visit (from the past 240 hour(s)).       Radiology Studies: CT VENOGRAM ABD/PELVIS/LOWER EXT BILAT  Result Date: 10/06/2021 CLINICAL DATA:  Right leg pain with known occlusive DVT. EXAM: CT VENOGRAM ABDOMEN AND PELVIS AND LOWER EXTREMITY BILATERAL TECHNIQUE: CT imaging of the abdomen pelvis and bilateral lower extremities performed following administration of intravenous contrast. RADIATION DOSE REDUCTION: This exam was performed according to the departmental dose-optimization program which includes automated exposure control, adjustment of the mA and/or kV according to patient size and/or use of iterative reconstruction technique.  CONTRAST:  119m OMNIPAQUE IOHEXOL 350 MG/ML SOLN COMPARISON:  Right lower extremity DVT ultrasound 10/04/2021 FINDINGS: Lower chest: Large sliding hiatal hernia. Mild dependent atelectasis. No focal opacity. Hepatobiliary: No focal liver abnormality is seen. No gallstones, gallbladder wall thickening, or biliary dilatation. Pancreas: Surgical changes of prior distal pancreatectomy. The remaining pancreatic tissue demonstrates no evidence of mass or inflammatory changes. Spleen: Surgically absent. Adrenals/Urinary Tract: Unremarkable adrenal glands. No hydronephrosis, nephrolithiasis or enhancing renal mass. No evidence of hydronephrosis. The ureters are normal. Diffuse mild thickening of the bladder wall with a right-sided Peri ureteral diverticulum. Stomach/Bowel: No focal bowel wall thickening or evidence of obstruction. Vascular/Lymphatic: Scattered atherosclerotic plaque along the abdominal aorta. Mild tortuosity. No evidence of aneurysm or dissection. Venous structures are well evaluated. The right common femoral vein is patent as is the saphenofemoral junction. DVT begins proximally in the right femoral vein. Reproductive: Surgical changes of prior TURP. Other: No abdominal wall hernia or abnormality. No abdominopelvic ascites. Musculoskeletal: No acute fracture or aggressive appearing lytic or blastic osseous lesion. Multilevel degenerative disc disease. IVC: A Bard Denali IVC filter is present within the infrarenal IVC. There is a small amount of thrombus within the nose  cone of the catheter. The IVC remains patent. Portal and mesenteric veins: Widely patent and unremarkable. Bilateral iliac veins: Both iliac veins are patent without evidence of venous compression or obstruction. Right lower extremity: The right common femoral vein is widely patent. The saphenofemoral junction is patent. No evidence of thrombus in the profunda femoral vein. Thrombus begins in the proximal femoral vein in the upper thigh and  extends throughout the femoral vein and into the popliteal vein. Evaluation of the popliteal vein is slightly limited due to knee arthroplasty prosthesis. The calf veins appear largely patent. Left lower extremity: No evidence of deep venous thrombosis in the left lower extremity. IMPRESSION: 1. Positive for DVT within the right femoral vein beginning at the vein origin in the upper thigh and extending through the popliteal vein. 2. No evidence of extension into the iliac venous system or IVC. 3. No evidence of iliac vein compression physiology. 4. Bard Denali IVC filter present in the infrarenal IVC with a small amount of nonocclusive thrombus in the nose cone. 5. Moderately large sliding hiatal hernia. 6.  Aortic Atherosclerosis (ICD10-I70.0). 7. 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 8. Additional ancillary findings as above. Electronically Signed   By: Jacqulynn Cadet M.D.   On: 10/06/2021 14:59        Scheduled Meds:  carvedilol  25 mg Oral QHS   fentaNYL  1 patch Transdermal Q72H   insulin aspart  0-9 Units Subcutaneous Q4H   lidocaine  1 patch Transdermal Q24H   tamsulosin  0.4 mg Oral QHS   Continuous Infusions:  heparin 2,100 Units/hr (10/07/21 2235)     LOS: 2 days    Time spent: 35 minutes    Leiby Pigeon A Nydia Ytuarte, MD [facility]   If 7PM-7AM, please contact night-coverage [email or web address]  10/08/2021, 7:51 AM

## 2021-10-08 NOTE — Sedation Documentation (Signed)
Second ACT 197

## 2021-10-08 NOTE — Plan of Care (Signed)
  Problem: Activity: Goal: Risk for activity intolerance will decrease Outcome: Progressing   Problem: Pain Managment: Goal: General experience of comfort will improve Outcome: Progressing   

## 2021-10-08 NOTE — Progress Notes (Addendum)
[place] for heparin Indication: DVT  Allergies  Allergen Reactions   Ditropan [Oxybutynin] Swelling and Other (See Comments)   Quinine Other (See Comments)    Platelets dropped- consumptive coagulopathy     Vibra-Tab [Doxycycline] Other (See Comments)    Epistaxis     Voltaren [Diclofenac] Swelling and Other (See Comments)    Elevated liver enzymes Lip swelling    Zoloft [Sertraline] Other (See Comments)    Insomnia Acute urinary retention   Cymbalta [Duloxetine Hcl] Other (See Comments)    Night sweats   Morphine And Related Nausea And Vomiting, Swelling and Other (See Comments)    Facial and lip swelling OK to use IR form of hydrocodone and oxycodone   Oxycontin [Oxycodone Hcl] Swelling and Other (See Comments)    Lip swelling OK to use IR form   Tape Other (See Comments)    Skin tears easily   Voltaren [Diclofenac Sodium] Other (See Comments)    Elevated liver enzymes   Zohydro Er [Hydrocodone Bitartrate Er] Swelling    Facial swelling OK to use IR form   Neurontin [Gabapentin] Other (See Comments)    Unknown reaction     Patient Measurements: Height: 6' (182.9 cm) Weight: 120 kg (264 lb 8.8 oz) IBW/kg (Calculated) : 77.6 Heparin Dosing Weight: 104kg  Vital Signs: BP: 123/78 (08/18 2332)  Labs: Recent Labs    10/06/21 1330 10/07/21 0223 10/07/21 1221 10/07/21 2114 10/08/21 0245  HGB 12.4* 11.9*  --   --  11.7*  HCT 38.4* 36.2*  --   --  36.4*  PLT 502* 438*  --   --  431*  APTT 35 56* 61* 100* 100*  LABPROT 16.3*  --   --   --   --   INR 1.3*  --   --   --   --   HEPARINUNFRC  --  >1.10*  --   --  1.07*  CREATININE 1.23 1.23  --   --  1.19     Estimated Creatinine Clearance: 70.7 mL/min (by C-G formula based on SCr of 1.19 mg/dL).   Assessment: 39 YOM presenting with leg pain, hx V Leiden and DVTs on Eliquis PTA with last dose taken 8/17 AM, also with IVC filter. [place] with DVT.  Heparin level >1.07  (affected by Eliquis), aPTT remains 100 sec (therapeutic) on infusion at 2100 units/hr. No issues with line or bleeding reported per RN.   Goal of Therapy:  Heparin level 0.3-0.7 units/ml aPTT 66-102 seconds Monitor platelets by anticoagulation protocol: Yes   Plan:  -Continue heparin to 2100 units/hr -Daily HL, aPTT, and CBC -will follow plans post mechanical thrombectomy   Francena Hanly, PharmD Pharmacy Resident  10/08/2021 8:19 AM ---------------------------------------------------------------------------------------------------------- I discussed / reviewed the pharmacy note by Dr. Oletta Lamas and I agree with the resident's findings and plans as documented.  Vaughan Basta BS, PharmD, BCPS Clinical Pharmacist 10/08/2021 8:42 AM  Contact: [phone] after 3 PM  "Be curious, not judgmental..." -Jamal Maes ----------------------------------------------------------------------------------------------------------

## 2021-10-08 NOTE — Progress Notes (Signed)
Patient reported that the fentanyl patch came off of his back while rolling over the bed. Sent a message to pharmacy that we need a new patch and got a message back to waste it. Wasted fentanyl patch in the med room.

## 2021-10-08 NOTE — Procedures (Signed)
Vascular and Interventional Radiology Procedure Note  Patient: Derek [name] DOB: January 20, 1946 Medical Record Number: [id number] Note Date/Time: 10/08/21 10:59 AM   Performing Physician: Michaelle Birks, MD Assistant(s): Debby Freiberg, IR RT  Diagnosis: Persistent, symptomatic RLE DVT despite AC   Procedure(s):  RIGHT LOWER EXTREMITY VENOGRAM,  MECHANICAL THROMBECTOMY for DVT, Catheter-directed   Anesthesia: Conscious Sedation Complications: None Estimated Blood Loss:  50 mL Specimens: Pathology   Findings:  - access via the Right popliteal vein   - extensive, near-occlusive femoropopliteal RLE DVT - catheter-directed thrombectomy with 12 [place] - significant clot burden retrieved   Plan: - RLE straight x 2 Hrs - continue Hep gtt and OK begin transition to PO AC from VIR perspective, orders per Medicine / Pharmacy Teams - No additional VIR intervention. OK to DC when disposition and AC transition complete. - Pt will follow up with me in Clinic, and for repeat RLE Venous Duplex in 1 month.   Final report to follow once all images are reviewed and compared with previous studies.  See detailed dictation with images in PACS. The patient tolerated the procedure well without incident or complication and was returned to Floor Bed in stable condition.    Michaelle Birks, MD [facility] Radiology   Pager. [place]

## 2021-10-08 NOTE — Sedation Documentation (Signed)
I-STAT ACT 143

## 2021-10-09 DIAGNOSIS — I82401 Acute embolism and thrombosis of unspecified deep veins of right lower extremity: Secondary | ICD-10-CM | POA: Diagnosis not present

## 2021-10-09 LAB — CBC
HCT: 35.4 % — ABNORMAL LOW (ref 39.0–52.0)
Hemoglobin: 11.7 g/dL — ABNORMAL LOW (ref 13.0–17.0)
MCH: 30 pg (ref 26.0–34.0)
MCHC: 33.1 g/dL (ref 30.0–36.0)
MCV: 90.8 fL (ref 80.0–100.0)
Platelets: 378 10*3/uL (ref 150–400)
RBC: 3.9 MIL/uL — ABNORMAL LOW (ref 4.22–5.81)
RDW: 14.2 % (ref 11.5–15.5)
WBC: 8.2 10*3/uL (ref 4.0–10.5)
nRBC: 0 % (ref 0.0–0.2)

## 2021-10-09 LAB — GLUCOSE, CAPILLARY
Glucose-Capillary: 174 mg/dL — ABNORMAL HIGH (ref 70–99)
Glucose-Capillary: 153 mg/dL — ABNORMAL HIGH (ref 70–99)
Glucose-Capillary: 151 mg/dL — ABNORMAL HIGH (ref 70–99)
Glucose-Capillary: 234 mg/dL — ABNORMAL HIGH (ref 70–99)
Glucose-Capillary: 252 mg/dL — ABNORMAL HIGH (ref 70–99)
Glucose-Capillary: 243 mg/dL — ABNORMAL HIGH (ref 70–99)

## 2021-10-09 LAB — HEPARIN LEVEL (UNFRACTIONATED)
Heparin Unfractionated: 0.85 IU/mL — ABNORMAL HIGH (ref 0.30–0.70)
Heparin Unfractionated: 0.63 IU/mL (ref 0.30–0.70)

## 2021-10-09 LAB — BASIC METABOLIC PANEL
Anion gap: 9 (ref 5–15)
BUN: 15 mg/dL (ref 8–23)
CO2: 20 mmol/L — ABNORMAL LOW (ref 22–32)
Calcium: 10 mg/dL (ref 8.9–10.3)
Chloride: 104 mmol/L (ref 98–111)
Creatinine, Ser: 1.1 mg/dL (ref 0.61–1.24)
GFR, Estimated: >60 mL/min (ref >60)
Glucose, Bld: 163 mg/dL — ABNORMAL HIGH (ref 70–99)
Potassium: 4.1 mmol/L (ref 3.5–5.1)
Sodium: 133 mmol/L — ABNORMAL LOW (ref 135–145)

## 2021-10-09 LAB — APTT
aPTT: 107 seconds — ABNORMAL HIGH (ref 24–36)
aPTT: 78 seconds — ABNORMAL HIGH (ref 24–36)

## 2021-10-09 MED ORDER — FENTANYL 25 MCG/HR TD PT72
1.0000 | MEDICATED_PATCH | TRANSDERMAL | Status: DC
  Administered 2021-10-09: 1 via TRANSDERMAL
  Filled 2021-10-09: qty 1

## 2021-10-09 NOTE — Progress Notes (Signed)
[place] for heparin Indication: DVT  Allergies  Allergen Reactions   Ditropan [Oxybutynin] Swelling and Other (See Comments)   Quinine Other (See Comments)    Platelets dropped- consumptive coagulopathy     Vibra-Tab [Doxycycline] Other (See Comments)    Epistaxis     Voltaren [Diclofenac] Swelling and Other (See Comments)    Elevated liver enzymes Lip swelling    Zoloft [Sertraline] Other (See Comments)    Insomnia Acute urinary retention   Cymbalta [Duloxetine Hcl] Other (See Comments)    Night sweats   Morphine And Related Nausea And Vomiting, Swelling and Other (See Comments)    Facial and lip swelling OK to use IR form of hydrocodone and oxycodone   Oxycontin [Oxycodone Hcl] Swelling and Other (See Comments)    Lip swelling OK to use IR form   Tape Other (See Comments)    Skin tears easily   Voltaren [Diclofenac Sodium] Other (See Comments)    Elevated liver enzymes   Zohydro Er [Hydrocodone Bitartrate Er] Swelling    Facial swelling OK to use IR form   Neurontin [Gabapentin] Other (See Comments)    Unknown reaction     Patient Measurements: Height: 6' (182.9 cm) Weight: 120 kg (264 lb 8.8 oz) IBW/kg (Calculated) : 77.6 Heparin Dosing Weight: 104kg  Vital Signs: Temp: 98 F (36.7 C) (08/20 1109) Temp Source: Oral (08/20 1109) BP: 131/79 (08/20 1109) Pulse Rate: 74 (08/20 1109)  Labs: Recent Labs    10/07/21 0223 10/07/21 1221 10/08/21 0245 10/09/21 0357 10/09/21 1455  HGB 11.9*  --  11.7* 11.7*  --   HCT 36.2*  --  36.4* 35.4*  --   PLT 438*  --  431* 378  --   APTT 56*   < > 100* 107* 78*  HEPARINUNFRC >1.10*  --  1.07* 0.85* 0.63  CREATININE 1.23  --  1.19 1.10  --    < > = values in this interval not displayed.    Estimated Creatinine Clearance: 76.4 mL/min (by C-G formula based on SCr of 1.1 mg/dL).  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 [place] with DVT.  Patient underwent right lower extremity venogram and subsequently catheter directed thrombectomy by IR on 8/19.   Heparin level >0. 63 (therapeutic), aPTT  78 seconds (therapeutic) on infusion at 1900 units/hr. These levels are correlating, thus will stop checking aPTTs and now use heparin levels to monitor heparin. No bleeding reported.   Dr. [name] following recommended Arixtra at discharge or  Lovenox; depending on if patient is able to afford medication.  Goal of Therapy:  Heparin level -0.3-0.7 units/mol Monitor platelets by anticoagulation protocol: Yes   Plan:  Continue heparin at 1900 units/hr Daily Heparin level, and CBC   Nicole Cella, RPh Clinical Pharmacist 10/09/2021 4:16 PM Please check AMION for all [place] numbers

## 2021-10-09 NOTE — Evaluation (Signed)
Physical Therapy Evaluation Patient Details Name: Derek [name] MRN: [id number] DOB: 07-19-1945 Today's Date: 10/09/2021  History of Present Illness  The pt is a 76 yo male presenting 8/18 with acute DVT in RLE despite being on Eliquis. Pt s/p mechanical thrombecotmy on 8/19. PMH includes: DM II, HTN, chronic back pain, and neuroendocine tumor of pancreas s/p pancreatectomy in 2017.   Clinical Impression  Pt in bed upon arrival of PT, agreeable to evaluation at this time. Prior to admission the pt was completely independent without need for DME or assist with ADLs, but does report fairly sedentary lifestyle. The pt now presents with minor limitations in functional mobility, strength, and endurance due to above dx, but is safe to return home when medically stable. The pt was able to complete sit-stand transfers and hallway ambulation without support or assist, and completed 4 stairs with use of rails but no assist or instability. The pt reports no change in pain, and VSS with all mobility. Discussed addition of progressive walking program at home to build endurance and pt verbalized agreement. Will continue to follow acutely to maintain mobility.      Recommendations for follow up therapy are one component of a multi-disciplinary discharge planning process, led by the attending physician.  Recommendations may be updated based on patient status, additional functional criteria and insurance authorization.  Follow Up Recommendations No PT follow up      Assistance Recommended at Discharge PRN  Patient can return home with the following  Help with stairs or ramp for entrance    Equipment Recommendations None recommended by PT  Recommendations for Other Services       Functional Status Assessment Patient has had a recent decline in their functional status and demonstrates the ability to make significant improvements in function in a reasonable and predictable amount of time.     Precautions  / Restrictions Precautions Precautions: None Restrictions Weight Bearing Restrictions: No      Mobility  Bed Mobility Overal bed mobility: Independent                  Transfers Overall transfer level: Independent Equipment used: None               General transfer comment: no assist or instability noted    Ambulation/Gait Ambulation/Gait assistance: Supervision Gait Distance (Feet): 150 Feet Assistive device: None Gait Pattern/deviations: Step-through pattern, Decreased stride length Gait velocity: 0.42 m/s Gait velocity interpretation: 1.31 - 2.62 ft/sec, indicative of limited community ambulator   General Gait Details: slightly slowed but no overt LOB or need for assist.  Stairs Stairs: Yes Stairs assistance: Min guard Stair Management: Two rails, Step to pattern, Forwards Number of Stairs: 4 General stair comments: step-to due to chronic R knee pain    Balance Overall balance assessment: Mild deficits observed, not formally tested                                           Pertinent Vitals/Pain Pain Assessment Pain Assessment: Faces Faces Pain Scale: Hurts little more Pain Location: chronic low back pain Pain Descriptors / Indicators: Discomfort Pain Intervention(s): Limited activity within patient's tolerance, Monitored during session, Repositioned    Home Living Family/patient expects to be discharged to:: Private residence Living Arrangements: Spouse/significant other Available Help at Discharge: Family;Available 24 hours/day Type of Home: House Home Access: Stairs to enter Entrance Stairs-Rails:  Right;Left Entrance Stairs-Number of Steps: 4   Home Layout: Able to live on main level with bedroom/bathroom Home Equipment: Cane - quad      Prior Function Prior Level of Function : Independent/Modified Independent;Driving             Mobility Comments: no use of DME, but reports sedentary lifestyle ADLs Comments:  independent     Hand Dominance   Dominant Hand: Right    Extremity/Trunk Assessment   Upper Extremity Assessment Upper Extremity Assessment: Overall WFL for tasks assessed    Lower Extremity Assessment Lower Extremity Assessment: RLE deficits/detail RLE Deficits / Details: increased edema, reports no numbness and tingling, grossly functional with OOB mobility, no buckling RLE Sensation: WNL RLE Coordination: WNL    Cervical / Trunk Assessment Cervical / Trunk Assessment: Normal  Communication   Communication: No difficulties  Cognition Arousal/Alertness: Awake/alert Behavior During Therapy: WFL for tasks assessed/performed Overall Cognitive Status: Within Functional Limits for tasks assessed                                          General Comments General comments (skin integrity, edema, etc.): VSS on RA        Assessment/Plan    PT Assessment Patient needs continued PT services  PT Problem List Decreased strength;Decreased range of motion;Decreased activity tolerance;Decreased balance       PT Treatment Interventions Gait training;Stair training;Functional mobility training;Therapeutic activities;Therapeutic exercise;Balance training;Patient/family education    PT Goals (Current goals can be found in the Care Plan section)  Acute Rehab PT Goals Patient Stated Goal: return home asap PT Goal Formulation: With patient Time For Goal Achievement: 10/23/21 Potential to Achieve Goals: Good    Frequency Min 3X/week        AM-PAC PT "6 Clicks" Mobility  Outcome Measure Help needed turning from your back to your side while in a flat bed without using bedrails?: None Help needed moving from lying on your back to sitting on the side of a flat bed without using bedrails?: None Help needed moving to and from a bed to a chair (including a wheelchair)?: None Help needed standing up from a chair using your arms (e.g., wheelchair or bedside chair)?:  None Help needed to walk in hospital room?: A Little Help needed climbing 3-5 steps with a railing? : A Little 6 Click Score: 22    End of Session Equipment Utilized During Treatment: Gait belt Activity Tolerance: Patient tolerated treatment well Patient left: in bed;with call bell/phone within reach;with nursing/sitter in room Nurse Communication: Mobility status PT Visit Diagnosis: Unsteadiness on feet (R26.81);Other abnormalities of gait and mobility (R26.89);Muscle weakness (generalized) (M62.81)    Time: 1701-1720 PT Time Calculation (min) (ACUTE ONLY): 19 min   Charges:   PT Evaluation $PT Eval Low Complexity: 1 Low          [place], PT, DPT   Acute Rehabilitation Department  Sandra Cockayne 10/09/2021, 6:25 PM

## 2021-10-09 NOTE — Progress Notes (Signed)
[place] for heparin Indication: DVT  Allergies  Allergen Reactions   Ditropan [Oxybutynin] Swelling and Other (See Comments)   Quinine Other (See Comments)    Platelets dropped- consumptive coagulopathy     Vibra-Tab [Doxycycline] Other (See Comments)    Epistaxis     Voltaren [Diclofenac] Swelling and Other (See Comments)    Elevated liver enzymes Lip swelling    Zoloft [Sertraline] Other (See Comments)    Insomnia Acute urinary retention   Cymbalta [Duloxetine Hcl] Other (See Comments)    Night sweats   Morphine And Related Nausea And Vomiting, Swelling and Other (See Comments)    Facial and lip swelling OK to use IR form of hydrocodone and oxycodone   Oxycontin [Oxycodone Hcl] Swelling and Other (See Comments)    Lip swelling OK to use IR form   Tape Other (See Comments)    Skin tears easily   Voltaren [Diclofenac Sodium] Other (See Comments)    Elevated liver enzymes   Zohydro Er [Hydrocodone Bitartrate Er] Swelling    Facial swelling OK to use IR form   Neurontin [Gabapentin] Other (See Comments)    Unknown reaction     Patient Measurements: Height: 6' (182.9 cm) Weight: 120 kg (264 lb 8.8 oz) IBW/kg (Calculated) : 77.6 Heparin Dosing Weight: 104kg  Vital Signs: Temp: 97.4 F (36.3 C) (08/20 0544) Temp Source: Oral (08/20 0544) BP: 125/71 (08/20 0544) Pulse Rate: 75 (08/20 0544)  Labs: Recent Labs    10/06/21 1330 10/07/21 0223 10/07/21 1221 10/07/21 2114 10/08/21 0245 10/09/21 0357  HGB 12.4* 11.9*  --   --  11.7* 11.7*  HCT 38.4* 36.2*  --   --  36.4* 35.4*  PLT 502* 438*  --   --  431* 378  APTT 35 56*   < > 100* 100* 107*  LABPROT 16.3*  --   --   --   --   --   INR 1.3*  --   --   --   --   --   HEPARINUNFRC  --  >1.10*  --   --  1.07* 0.85*  CREATININE 1.23 1.23  --   --  1.19 1.10   < > = values in this interval not displayed.    Estimated Creatinine Clearance: 76.4 mL/min (by C-G formula  based on SCr of 1.1 mg/dL).  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 [place] with DVT.  Heparin level >0.85 (affected by Eliquis), aPTT remains 107 sec (therapeutic) on infusion at 2100 units/hr. These levels are close to correlating. No issues with line or bleeding reported per RN.   Goal of Therapy:  Heparin level 0.3-0.7 units/ml aPTT 66-102 seconds Monitor platelets by anticoagulation protocol: Yes   Plan:  -Reduce heparin to 1900 units/hr -8 hour level then Daily Heparin level, aPTT, and CBC -will follow plans post mechanical thrombectomy    Georga Bora, PharmD Clinical Pharmacist 10/09/2021 6:21 AM Please check AMION for all [place] numbers

## 2021-10-09 NOTE — Progress Notes (Signed)
Referring Physician(s): Dr Tyrell Antonio  Supervising Physician: Michaelle Birks  Patient Status:  [facility] - In-pt  Chief Complaint:  RLE DVT Factor 5 Leiden hx  Subjective:  IR procedure 8/19: Dr Maryelizabeth Kaufmann RIGHT LOWER EXTREMITY VENOGRAM,  MECHANICAL THROMBECTOMY for DVT, Catheter-directed  Pt feels so much better already today Less pain; less red; less swelling Able to put wt on foot Can bend knee   Allergies: Ditropan [oxybutynin], Quinine, Vibra-tab [doxycycline], Voltaren [diclofenac], Zoloft [sertraline], Cymbalta [duloxetine hcl], Morphine and related, Oxycontin [oxycodone hcl], Tape, Voltaren [diclofenac sodium], Zohydro er [employer] bitartrate er], and Neurontin [gabapentin]  Medications: Prior to Admission medications   Medication Sig Start Date End Date Taking? Authorizing Provider  acetaminophen (TYLENOL) 500 MG tablet Take 500 mg by mouth every 6 (six) hours as needed for mild pain or headache.   Yes [provider]  apixaban (ELIQUIS) 5 MG TABS tablet Take 1 tablet (5 mg total) by mouth 2 (two) times daily. 09/27/21  Yes   aspirin EC 81 MG tablet Take 81 mg by mouth daily. Swallow whole.   Yes [provider]  carvedilol (COREG) 25 MG tablet Take 1 tablet by mouth once a day every evening 07/08/21  Yes   diphenhydramine-acetaminophen (TYLENOL PM) 25-500 MG TABS tablet Take 2 tablets by mouth at bedtime.   Yes [provider]  Dulaglutide (TRULICITY) 3 NI/6.2VO SOPN Inject 0.5 mLs (3 mg total) into the skin every 7 days. Patient taking differently: Inject 3 mg into the skin every Monday. 08/16/21  Yes   empagliflozin (JARDIANCE) 25 MG TABS tablet Take 1 tablet (25 mg total) by mouth daily. 08/16/21  Yes   fentaNYL (DURAGESIC) 25 MCG/HR Place 1 patch onto the skin every 3 (three) days. 3/50/09  Yes Campbell Stall P, DO  furosemide (LASIX) 20 MG tablet Take 20 mg by mouth daily as needed for edema.   Yes [provider]  lansoprazole  (PREVACID) 15 MG capsule Take 15 mg by mouth daily as needed (for breakthrough GERD).   Yes [provider]  lansoprazole (PREVACID) 30 MG capsule TAKE 1 CAPSULE BY MOUTH ONCE DAILY Patient taking differently: Take 30 mg by mouth at bedtime. 07/08/21  Yes   lidocaine (LIDODERM) 5 % Place 1 patch onto the skin daily. Remove & Discard patch within 12 hours or as directed by MD 12/01/20  Yes Cristal Deer, MD  losartan (COZAAR) 50 MG tablet Take 1 tablet by mouth once every evening 07/08/21  Yes   magnesium oxide (MAG-OX) 400 MG tablet Take 800 mg by mouth at bedtime.   Yes [provider]  OVER THE COUNTER MEDICATION Apply 1 application  topically 2 (two) times daily as needed (pain). [place] Dream cream   Yes [provider]  oxybutynin (DITROPAN) 5 MG tablet Take 1 tablet (5 mg total) by mouth every 6 (six) to 8 (eight) hours as needed for urinary urgency/frequency/leakage. Patient taking differently: Take 5 mg by mouth at bedtime. 05/26/21  Yes   pravastatin (PRAVACHOL) 20 MG tablet Take 1 tablet by mouth twice a week Patient taking differently: Take 20 mg by mouth 2 (two) times a week. Monday, Friday 07/08/21  Yes   tamsulosin (FLOMAX) 0.4 MG CAPS capsule take 1 capsule by mouth at bedtime 09/19/21  Yes   triamcinolone cream (KENALOG) 0.1 % Apply externally two times a day for 30 days. Patient taking differently: Apply 1 application  topically daily as needed (irritation). 10/07/20  Yes   insulin aspart (NOVOLOG) 100  UNIT/ML injection USE 100U/DAY SUBCUTANEOUS VIA PUMP 30 DAY(S) Patient not taking: Reported on 10/07/2021 12/23/19 11/26/21  Jacelyn Pi, MD  Iron-FA-B Cmp-C-Biot-Probiotic (FUSION PLUS) CAPS Take 1 capsule by mouth daily. Patient not taking: Reported on 10/07/2021 03/02/21   Celso Amy, NP  nystatin (MYCOSTATIN) 100000 UNIT/ML suspension Use 4-5 mL for Mouth/Throat as directed four times a day Patient not taking: Reported on 10/07/2021 07/08/21         Vital Signs: BP 120/86 (BP Location: Left Arm)   Pulse 75   Temp 97.8 F (36.6 C) (Oral)   Resp 18   Ht 6' (1.829 m)   Wt 264 lb 8.8 oz (120 kg)   SpO2 94%   BMI 35.88 kg/m   Physical Exam Musculoskeletal:        General: Normal range of motion.     Comments: Mild swelling remains  Skin:    General: Skin is warm.     Comments: Site posterior knee is NT no bleeding No hematoma C/d/I Dressing dry  Neurological:     Mental Status: He is oriented to person, place, and time.  Psychiatric:        Behavior: Behavior normal.     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 Known RIGHT lower extremity DVT and on on Eliquis with worsening swelling. EXAM: Procedures: 1. ULTRASOUND GUIDANCE FOR RIGHT POPLITEAL VEIN ACCESS 2. RIGHT LOWER EXTREMITY VENOGRAM and MECHANICAL THROMBECTOMY for DVT COMPARISON:  CT venogram, 10/06/2021. Lower extremity duplex, 10/04/2021 and 09/12/2021. MEDICATIONS: 2 g Ancef IV administered as preprocedure antibiotics. 3K IU heparin IV. CONTRAST:  77m OMNIPAQUE IOHEXOL 300 MG/ML  SOLN ANESTHESIA/SEDATION: Moderate (conscious) sedation was employed during this procedure. A total of Versed 4 mg and Fentanyl 200 mcg was administered intravenously. Moderate Sedation Time: 48 minutes. The patient's level of consciousness and vital signs were monitored continuously by radiology nursing throughout the procedure under my direct supervision. FLUOROSCOPY TIME:  Fluoroscopic dose; 558.8mGy COMPLICATIONS: None immediate. TECHNIQUE: Informed written consent was obtained from the patient after a discussion of the risks, benefits and alternatives to treatment. Questions regarding the procedure were encouraged and answered. A timeout was performed prior to the  initiation of the procedure. The patient was placed supine on the fluoroscopy table and the skin posterior to the RIGHT knee was prepped and draped in the usual sterile fashion, and a sterile drape was applied covering the operative field. Maximum barrier sterile technique with sterile gowns and gloves were used for the procedure. Under direct ultrasound guidance, the RIGHT popliteal vein was access with a micropuncture kit after the overlying soft tissues were anesthetized with 1% lidocaine. An ultrasound image was saved for documentation purposed. This allowed for placement of a 5 Fr vascular sheath. A limited venogram was performed through the side arm of the vascular sheath. With the use of a glidewire, a Kumpe catheter was advanced to the RIGHT external iliac vein. Limited venograms were performed in multiple stations, up to the level of the pelvic veins. The sheath was exchanged for a 12 Fr 30 cm Brite tip sheath, then a Penumbra 12 Fr lightning catheter was used to perform serial mechanical thrombectomy from the popliteal vein to the level of the common femoral vein. Follow-up venograms demonstrated adequate clearance. Images were saved and will be uploaded  PACS. The wire and vascular sheath removed. A purse-string suture and manual compression at the posterior RIGHT knee were performed for hemostasis. A dressing was placed. The patient tolerated the procedure well without immediate postprocedural complication. FINDINGS: 1. Initial ultrasound scanning demonstrates the RIGHT popliteal vein is enlarged and occluded with echogenic and non compressible thrombus. 2. Extensive RIGHT lower extremity femoropopliteal DVT, with central extension to the site of RIGHT common femoral vein. 3. Technique successful catheter-directed aspiration mechanical thrombectomy, with venography findings of eccentric femoral vein defects consistent with chronic DVT. IMPRESSION: Successful RIGHT lower extremity venogram and  catheter-directed mechanical thrombectomy for extensive femoropopliteal DVT, as above. PLAN: 1. Continue systemic anticoagulation with heparin gtt. Management per pharmacy and medicine teams. 2. Okay to transitioned to PO [facility] and to discharge from a vascular interventional radiology (VIR) perspective. 3. The patient will be seen in clinic by me for follow-up and repeat RIGHT lower extremity venous duplex in 1 month. Michaelle Birks, MD [facility] Radiology Electronically Signed   By: Michaelle Birks M.D.   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 Known RIGHT lower extremity DVT and on on Eliquis with worsening swelling. EXAM: Procedures: 1. ULTRASOUND GUIDANCE FOR RIGHT POPLITEAL VEIN ACCESS 2. RIGHT LOWER EXTREMITY VENOGRAM and MECHANICAL THROMBECTOMY for DVT COMPARISON:  CT venogram, 10/06/2021. Lower extremity duplex, 10/04/2021 and 09/12/2021. MEDICATIONS: 2 g Ancef IV administered as preprocedure antibiotics. 3K IU heparin IV. CONTRAST:  75m OMNIPAQUE IOHEXOL 300 MG/ML  SOLN ANESTHESIA/SEDATION: Moderate (conscious) sedation was employed during this procedure. A total of Versed 4 mg and Fentanyl 200 mcg was administered intravenously. Moderate Sedation Time: 48 minutes. The patient's level of consciousness and vital signs were monitored continuously by radiology nursing throughout the procedure under my direct supervision. FLUOROSCOPY TIME:  Fluoroscopic dose; 516.3mGy COMPLICATIONS: None immediate. TECHNIQUE: Informed written consent was obtained from the patient after a discussion of the risks, benefits and alternatives to treatment. Questions regarding the procedure were encouraged and answered. A timeout was performed prior to  the initiation of the procedure. The patient was placed supine on the fluoroscopy table and the skin posterior to the RIGHT knee was prepped and draped in the usual sterile fashion, and a sterile drape was applied covering the operative field. Maximum barrier sterile technique with sterile gowns and gloves were used for the procedure. Under direct ultrasound guidance, the RIGHT popliteal vein was access with a micropuncture kit after the overlying soft tissues were anesthetized with 1% lidocaine. An ultrasound image was saved for documentation purposed. This allowed for placement of a 5 Fr vascular sheath. A limited venogram was performed through the side arm of the vascular sheath. With the use of a glidewire, a Kumpe catheter was advanced to the RIGHT external iliac vein. Limited venograms were performed in multiple stations, up to the level of the pelvic veins. The sheath was exchanged for a 12 Fr 30 cm Brite tip sheath, then a Penumbra 12 Fr lightning catheter was used to perform serial mechanical thrombectomy from the popliteal vein to the level of the common femoral vein. Follow-up venograms demonstrated adequate clearance. Images were saved and will be uploaded PACS. The wire and vascular sheath removed. A purse-string suture and manual compression at the posterior RIGHT knee were performed for  hemostasis. A dressing was placed. The patient tolerated the procedure well without immediate postprocedural complication. FINDINGS: 1. Initial ultrasound scanning demonstrates the RIGHT popliteal vein is enlarged and occluded with echogenic and non compressible thrombus. 2. Extensive RIGHT lower extremity femoropopliteal DVT, with central extension to the site of RIGHT common femoral vein. 3. Technique successful catheter-directed aspiration mechanical thrombectomy, with venography findings of eccentric femoral vein defects consistent with chronic DVT. IMPRESSION: Successful RIGHT lower extremity venogram and  catheter-directed mechanical thrombectomy for extensive femoropopliteal DVT, as above. PLAN: 1. Continue systemic anticoagulation with heparin gtt. Management per pharmacy and medicine teams. 2. Okay to transitioned to PO [facility] and to discharge from a vascular interventional radiology (VIR) perspective. 3. The patient will be seen in clinic by me for follow-up and repeat RIGHT lower extremity venous duplex in 1 month. Michaelle Birks, MD [facility] Radiology Electronically Signed   By: Michaelle Birks M.D.   On: 10/08/2021 16:05   CT VENOGRAM ABD/PELVIS/LOWER EXT BILAT  Result Date: 10/06/2021 CLINICAL DATA:  Right leg pain with known occlusive DVT. EXAM: CT VENOGRAM ABDOMEN AND PELVIS AND LOWER EXTREMITY BILATERAL TECHNIQUE: CT imaging of the abdomen pelvis and bilateral lower extremities performed following administration of intravenous contrast. RADIATION DOSE REDUCTION: This exam was performed according to the departmental dose-optimization program which includes automated exposure control, adjustment of the mA and/or kV according to patient size and/or use of iterative reconstruction technique. CONTRAST:  141m OMNIPAQUE IOHEXOL 350 MG/ML SOLN COMPARISON:  Right lower extremity DVT ultrasound 10/04/2021 FINDINGS: Lower chest: Large sliding hiatal hernia. Mild dependent atelectasis. No focal opacity. Hepatobiliary: No focal liver abnormality is seen. No gallstones, gallbladder wall thickening, or biliary dilatation. Pancreas: Surgical changes of prior distal pancreatectomy. The remaining pancreatic tissue demonstrates no evidence of mass or inflammatory changes. Spleen: Surgically absent. Adrenals/Urinary Tract: Unremarkable adrenal glands. No hydronephrosis, nephrolithiasis or enhancing renal mass. No evidence of hydronephrosis. The ureters are normal. Diffuse mild thickening of the bladder wall with a right-sided Peri ureteral diverticulum. Stomach/Bowel: No focal  bowel wall thickening or evidence of obstruction. Vascular/Lymphatic: Scattered atherosclerotic plaque along the abdominal aorta. Mild tortuosity. No evidence of aneurysm or dissection. Venous structures are well evaluated. The right common femoral vein is patent as is the saphenofemoral junction. DVT begins proximally in the right femoral vein. Reproductive: Surgical changes of prior TURP. Other: No abdominal wall hernia or abnormality. No abdominopelvic ascites. Musculoskeletal: No acute fracture or aggressive appearing lytic or blastic osseous lesion. Multilevel degenerative disc disease. IVC: A Bard Denali IVC filter is present within the infrarenal IVC. There is a small amount of thrombus within the nose cone of the catheter. The IVC remains patent. Portal and mesenteric veins: Widely patent and unremarkable. Bilateral iliac veins: Both iliac veins are patent without evidence of venous compression or obstruction. Right lower extremity: The right common femoral vein is widely patent. The saphenofemoral junction is patent. No evidence of thrombus in the profunda femoral vein. Thrombus begins in the proximal femoral vein in the upper thigh and extends throughout the femoral vein and into the popliteal vein. Evaluation of the popliteal vein is slightly limited due to knee arthroplasty prosthesis. The calf veins appear largely patent. Left lower extremity: No evidence of deep venous thrombosis in the left lower extremity. IMPRESSION: 1. Positive for DVT within the right femoral vein beginning at the vein origin in the upper thigh and extending through the popliteal vein. 2. No evidence of extension into the iliac venous system or  IVC. 3. No evidence of iliac vein compression physiology. 4. Bard Denali IVC filter present in the infrarenal IVC with a small amount of nonocclusive thrombus in the nose cone. 5. Moderately large sliding hiatal hernia. 6.  Aortic Atherosclerosis (ICD10-I70.0). 7. 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 8. Additional ancillary findings as above. Electronically Signed   By: Jacqulynn Cadet M.D.   On: 10/06/2021 14:59    Labs:  CBC: Recent Labs    10/06/21 1330 10/07/21 0223 10/08/21 0245 10/09/21 0357  WBC 6.8 8.7 8.5 8.2  HGB 12.4* 11.9* 11.7* 11.7*  HCT 38.4* 36.2* 36.4* 35.4*  PLT 502* 438* 431* 378    COAGS: Recent Labs    11/25/20 0511 10/06/21 1330 10/07/21 0223 10/07/21 1221 10/07/21 2114 10/08/21 0245 10/09/21 0357  INR 1.3* 1.3*  --   --   --   --   --   APTT  --  35   < > 61* 100* 100* 107*   < > = values in this interval not displayed.    BMP: Recent Labs    10/06/21 1330 10/07/21 0223 10/08/21 0245 10/09/21 0357  NA 134* 134* 136 133*  K 4.3 4.3 4.0 4.1  CL 104 105 106 104  CO2 22 23 21* 20*  GLUCOSE 236* 168* 122* 163*  BUN '18 15 13 15  ' CALCIUM 9.9 9.9 10.0 10.0  CREATININE 1.23 1.23 1.19 1.10  GFRNONAA >60 >60 >60 >60    LIVER FUNCTION TESTS: Recent Labs    11/27/20 0924 03/02/21 1046 09/21/21 1350 10/05/21 1529  BILITOT 0.5 0.5 0.4 0.3  AST 15 11* 19 13*  ALT '16 11 21 14  ' ALKPHOS 75 89 78 93  PROT 6.8 6.4* 7.0 7.3  ALBUMIN 3.8 3.9 4.1 4.1    Assessment and Plan:  RLE DVT-- Thrombectomy in IR Continue Hep gtt and OK begin transition to PO AC from VIR perspective, orders per Medicine / Pharmacy Teams - No additional VIR intervention. OK to DC when disposition and AC transition complete. - Pt will follow up with me in Clinic, and for repeat RLE Venous Duplex in 1 month. Pt will hear from [facility] for appt date and time  Electronically Signed: Lavonia Drafts, PA-C 10/09/2021, 8:07 AM   I spent a total of 15 Minutes at the the patient's bedside AND on the patient's hospital floor or unit, greater than 50% of which was counseling/coordinating care for RLE DVT treatment

## 2021-10-09 NOTE — Progress Notes (Signed)
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  Dr. [name] recommended thrombectomy by IR-continue with heparin drip.  Plan to transition to Arixtra if patient able to afford it.   Patient underwent right lower extremity venogram and subsequently catheter directed thrombectomy by IR on 8/19.   Continue Heparin Gtt, awaiting information in regards if his insurance will cover for Arixtra, CM consulted.   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 -Dr. [name] following recommended Arixtra at discharge or  Lovenox.  -Case Management consulted to see if patient is able to afford medication -Continue with heparin drip, transition to Arixtra or Lovenox depending on affordability.   2-Chronic back pain: Patient was a started on fentanyl patch. He had very bad experience with opioids in the past, Dilaudid, he almost committed suicide. He is aware that he wont   be discharged on fentanyl patch  Hypertension: Continue with carvedilol  Diabetes type 2 insulin-dependent Sliding scale insulin. Neuroendocrine tumor of pancreas status post distal  pancreatectomy and splenectomy 02/25/2015 Continue to follow with Dr. [name]     Estimated body mass index is 35.88 kg/m as calculated from the following:   Height as of this encounter: 6' (1.829 m).   Weight as of this encounter: 120 kg.   DVT prophylaxis: Heparin  Code Status: Full code Family Communication: Wife at bedside 8/19.  Disposition Plan:  Status is: Inpatient Remains inpatient appropriate because: management of DVT    Consultants:  IR Dr [name].   Procedures:  Venogram, thrombectomy   Antimicrobials:    Subjective: He is doing ok, complaining of chronic back pain. Denies Lower extremity pain.   Objective: Vitals:   10/09/21 0225 10/09/21 0544 10/09/21 0734 10/09/21 1109  BP: 112/65 125/71 120/86 131/79  Pulse:  75  74  Resp:  _0 Temp:  (!) 97.4 F (36.3 C) 97.8 F (36.6 C) 98 F (36.7 C)  TempSrc:  Oral Oral Oral  SpO2:  94%  97%  Weight:      Height:        Intake/Output Summary (Last 24 hours) at 10/09/2021 1408 Last data filed at 10/09/2021 0900 Gross per 24 hour  Intake 1625.88 ml  Output --  Net 1625.88 ml   Filed Weights   10/06/21 1407 10/06/21 2128 10/07/21 0415  Weight: 122 kg 120.5 kg 120 kg    Examination:  General exam: NAD Respiratory system: CTA Cardiovascular system: S,1 S 2 RRR Gastrointestinal system: BS present, soft nt Central nervous system: Alert and oriented.  Extremities: moves BL extremities.     Data Reviewed: I have personally reviewed following labs and imaging studies  CBC: Recent Labs  Lab 10/05/21 1529 10/05/21 06-Jun-1945 10/06/21 1330 10/07/21  7564 10/08/21 0245 10/09/21 0357  WBC 6.9 8.2 6.8 8.7 8.5 8.2  NEUTROABS 3.1  --   --   --  3.1  --   HGB 12.5* 13.0 12.4* 11.9* 11.7* 11.7*  HCT 38.9* 42.0 38.4* 36.2* 36.4* 35.4*  MCV 91.3 97.2 90.6 91.4 91.7 90.8  PLT 468* 433* 502* 438* 431* 332    Basic Metabolic Panel: Recent Labs  Lab 10/05/21 1947 10/06/21 1330 10/07/21 0223  10/08/21 0245 10/09/21 0357  NA 137 134* 134* 136 133*  K 5.2* 4.3 4.3 4.0 4.1  CL 107 104 105 106 104  CO2 20* 22 23 21* 20*  GLUCOSE 119* 236* 168* 122* 163*  BUN _0 CREATININE 1.16 1.23 1.23 1.19 1.10  CALCIUM 10.7* 9.9 9.9 10.0 10.0    GFR: Estimated Creatinine Clearance: 76.4 mL/min (by C-G formula based on SCr of 1.1 mg/dL). Liver Function Tests: Recent Labs  Lab 10/05/21 1529  AST 13*  ALT 14  ALKPHOS 93  BILITOT 0.3  PROT 7.3  ALBUMIN 4.1    No results for input(s): "LIPASE", "AMYLASE" in the last 168 hours. No results for input(s): "AMMONIA" in the last 168 hours. Coagulation Profile: Recent Labs  Lab 10/06/21 1330  INR 1.3*    Cardiac Enzymes: No results for input(s): "CKTOTAL", "CKMB", "CKMBINDEX", "TROPONINI" in the last 168 hours. BNP (last 3 results) No results for input(s): "PROBNP" in the last 8760 hours. HbA1C: Recent Labs    10/07/21 0223  HGBA1C 8.3*    CBG: Recent Labs  Lab 10/08/21 2055 10/09/21 0217 10/09/21 0549 10/09/21 0739 10/09/21 1108  GLUCAP 210* 174* 153* 151* 234*    Lipid Profile: No results for input(s): "CHOL", "HDL", "LDLCALC", "TRIG", "CHOLHDL", "LDLDIRECT" in the last 72 hours. Thyroid Function Tests: No results for input(s): "TSH", "T4TOTAL", "FREET4", "T3FREE", "THYROIDAB" in the last 72 hours. Anemia Panel: No results for input(s): "VITAMINB12", "FOLATE", "FERRITIN", "TIBC", "IRON", "RETICCTPCT" in the last 72 hours. Sepsis Labs: No results for input(s): "PROCALCITON", "LATICACIDVEN" in the last 168 hours.  No results found for this or any previous visit (from the past 240 hour(s)).       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 Known RIGHT lower extremity  DVT and on on Eliquis with worsening swelling. EXAM: Procedures: 1. ULTRASOUND GUIDANCE FOR RIGHT POPLITEAL VEIN ACCESS 2. RIGHT LOWER EXTREMITY VENOGRAM and MECHANICAL THROMBECTOMY for DVT COMPARISON:  CT venogram, 10/06/2021. Lower extremity duplex, 10/04/2021 and 09/12/2021. MEDICATIONS: 2 g Ancef IV administered as preprocedure antibiotics. 3K IU heparin IV. CONTRAST:  38m OMNIPAQUE IOHEXOL 300 MG/ML  SOLN ANESTHESIA/SEDATION: Moderate (conscious) sedation was employed during this procedure. A total of Versed 4 mg and Fentanyl 200 mcg was administered intravenously. Moderate Sedation Time: 48 minutes. The patient's level of consciousness and vital signs were monitored continuously by radiology nursing throughout the procedure under my direct supervision. FLUOROSCOPY TIME:  Fluoroscopic dose; 595.1mGy COMPLICATIONS: None immediate. TECHNIQUE: Informed written consent was obtained from the patient after a discussion of the risks, benefits and alternatives to treatment. Questions regarding the procedure were encouraged and answered. A timeout was performed prior to the initiation of the procedure. The patient was placed supine on the fluoroscopy table and the skin posterior to the RIGHT knee was prepped and draped in  the usual sterile fashion, and a sterile drape was applied covering the operative field. Maximum barrier sterile technique with sterile gowns and gloves were used for the procedure. Under direct ultrasound guidance, the RIGHT popliteal vein was access with a micropuncture kit after the overlying soft tissues were anesthetized with 1% lidocaine. An ultrasound image was saved for documentation purposed. This allowed for placement of a 5 Fr vascular sheath. A limited venogram was performed through the side arm of the vascular sheath. With the use of a glidewire, a Kumpe catheter was advanced to the RIGHT external iliac vein. Limited venograms were performed in multiple stations, up to the level of the  pelvic veins. The sheath was exchanged for a 12 Fr 30 cm Brite tip sheath, then a Penumbra 12 Fr lightning catheter was used to perform serial mechanical thrombectomy from the popliteal vein to the level of the common femoral vein. Follow-up venograms demonstrated adequate clearance. Images were saved and will be uploaded PACS. The wire and vascular sheath removed. A purse-string suture and manual compression at the posterior RIGHT knee were performed for hemostasis. A dressing was placed. The patient tolerated the procedure well without immediate postprocedural complication. FINDINGS: 1. Initial ultrasound scanning demonstrates the RIGHT popliteal vein is enlarged and occluded with echogenic and non compressible thrombus. 2. Extensive RIGHT lower extremity femoropopliteal DVT, with central extension to the site of RIGHT common femoral vein. 3. Technique successful catheter-directed aspiration mechanical thrombectomy, with venography findings of eccentric femoral vein defects consistent with chronic DVT. IMPRESSION: Successful RIGHT lower extremity venogram and catheter-directed mechanical thrombectomy for extensive femoropopliteal DVT, as above. PLAN: 1. Continue systemic anticoagulation with heparin gtt. Management per pharmacy and medicine teams. 2. Okay to transitioned to PO [facility] and to discharge from a vascular interventional radiology (VIR) perspective. 3. The patient will be seen in clinic by me for follow-up and repeat RIGHT lower extremity venous duplex in 1 month. Michaelle Birks, MD [facility] Radiology Electronically Signed   By: Michaelle Birks M.D.   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 Known RIGHT lower extremity DVT  and on on Eliquis with worsening swelling. EXAM: Procedures: 1. ULTRASOUND GUIDANCE FOR RIGHT POPLITEAL VEIN ACCESS 2. RIGHT LOWER EXTREMITY VENOGRAM and MECHANICAL THROMBECTOMY for DVT COMPARISON:  CT venogram, 10/06/2021. Lower extremity duplex, 10/04/2021 and 09/12/2021. MEDICATIONS: 2 g Ancef IV administered as preprocedure antibiotics. 3K IU heparin IV. CONTRAST:  73m OMNIPAQUE IOHEXOL 300 MG/ML  SOLN ANESTHESIA/SEDATION: Moderate (conscious) sedation was employed during this procedure. A total of Versed 4 mg and Fentanyl 200 mcg was administered intravenously. Moderate Sedation Time: 48 minutes. The patient's level of consciousness and vital signs were monitored continuously by radiology nursing throughout the procedure under my direct supervision. FLUOROSCOPY TIME:  Fluoroscopic dose; 582.5mGy COMPLICATIONS: None immediate. TECHNIQUE: Informed written consent was obtained from the patient after a discussion of the risks, benefits and alternatives to treatment. Questions regarding the procedure were encouraged and answered. A timeout was performed prior to the initiation of the procedure. The patient was placed supine on the fluoroscopy table and the skin posterior to the RIGHT knee was prepped and draped in the usual sterile fashion, and a sterile drape was applied covering the operative field. Maximum barrier sterile technique with sterile gowns  and gloves were used for the procedure. Under direct ultrasound guidance, the RIGHT popliteal vein was access with a micropuncture kit after the overlying soft tissues were anesthetized with 1% lidocaine. An ultrasound image was saved for documentation purposed. This allowed for placement of a 5 Fr vascular sheath. A limited venogram was performed through the side arm of the vascular sheath. With the use of a glidewire, a Kumpe catheter was advanced to the RIGHT external iliac vein. Limited venograms were performed in multiple stations, up to the level of the pelvic  veins. The sheath was exchanged for a 12 Fr 30 cm Brite tip sheath, then a Penumbra 12 Fr lightning catheter was used to perform serial mechanical thrombectomy from the popliteal vein to the level of the common femoral vein. Follow-up venograms demonstrated adequate clearance. Images were saved and will be uploaded PACS. The wire and vascular sheath removed. A purse-string suture and manual compression at the posterior RIGHT knee were performed for hemostasis. A dressing was placed. The patient tolerated the procedure well without immediate postprocedural complication. FINDINGS: 1. Initial ultrasound scanning demonstrates the RIGHT popliteal vein is enlarged and occluded with echogenic and non compressible thrombus. 2. Extensive RIGHT lower extremity femoropopliteal DVT, with central extension to the site of RIGHT common femoral vein. 3. Technique successful catheter-directed aspiration mechanical thrombectomy, with venography findings of eccentric femoral vein defects consistent with chronic DVT. IMPRESSION: Successful RIGHT lower extremity venogram and catheter-directed mechanical thrombectomy for extensive femoropopliteal DVT, as above. PLAN: 1. Continue systemic anticoagulation with heparin gtt. Management per pharmacy and medicine teams. 2. Okay to transitioned to PO [facility] and to discharge from a vascular interventional radiology (VIR) perspective. 3. The patient will be seen in clinic by me for follow-up and repeat RIGHT lower extremity venous duplex in 1 month. Michaelle Birks, MD [facility] Radiology Electronically Signed   By: Michaelle Birks M.D.   On: 10/08/2021 16:05        Scheduled Meds:  carvedilol  25 mg Oral QHS   fentaNYL  1 patch Transdermal Q72H   insulin aspart  0-9 Units Subcutaneous Q4H   lidocaine  1 patch Transdermal Q24H   polyethylene glycol  17 g Oral Daily   tamsulosin  0.4 mg Oral QHS   Continuous Infusions:  heparin 1,900 Units/hr  (10/09/21 1156)     LOS: 3 days    Time spent: 35 minutes     A , MD [facility]   If 7PM-7AM, please contact night-coverage [email or web address]  10/09/2021, 2:08 PM

## 2021-10-10 ENCOUNTER — Other Ambulatory Visit (HOSPITAL_BASED_OUTPATIENT_CLINIC_OR_DEPARTMENT_OTHER): Payer: Self-pay

## 2021-10-10 ENCOUNTER — Other Ambulatory Visit (HOSPITAL_COMMUNITY): Payer: Self-pay

## 2021-10-10 ENCOUNTER — Telehealth (HOSPITAL_COMMUNITY): Payer: Self-pay | Admitting: Pharmacy Technician

## 2021-10-10 DIAGNOSIS — I10 Essential (primary) hypertension: Secondary | ICD-10-CM | POA: Diagnosis not present

## 2021-10-10 DIAGNOSIS — I82401 Acute embolism and thrombosis of unspecified deep veins of right lower extremity: Secondary | ICD-10-CM | POA: Diagnosis not present

## 2021-10-10 DIAGNOSIS — E119 Type 2 diabetes mellitus without complications: Secondary | ICD-10-CM | POA: Diagnosis not present

## 2021-10-10 DIAGNOSIS — D3A8 Other benign neuroendocrine tumors: Secondary | ICD-10-CM | POA: Diagnosis not present

## 2021-10-10 DIAGNOSIS — Z7901 Long term (current) use of anticoagulants: Secondary | ICD-10-CM | POA: Diagnosis not present

## 2021-10-10 LAB — BASIC METABOLIC PANEL
Anion gap: 10 (ref 5–15)
BUN: 15 mg/dL (ref 8–23)
CO2: 22 mmol/L (ref 22–32)
Calcium: 10.4 mg/dL — ABNORMAL HIGH (ref 8.9–10.3)
Chloride: 101 mmol/L (ref 98–111)
Creatinine, Ser: 1.13 mg/dL (ref 0.61–1.24)
GFR, Estimated: >60 mL/min (ref >60)
Glucose, Bld: 221 mg/dL — ABNORMAL HIGH (ref 70–99)
Potassium: 4.3 mmol/L (ref 3.5–5.1)
Sodium: 133 mmol/L — ABNORMAL LOW (ref 135–145)

## 2021-10-10 LAB — CBC
HCT: 36.4 % — ABNORMAL LOW (ref 39.0–52.0)
Hemoglobin: 11.8 g/dL — ABNORMAL LOW (ref 13.0–17.0)
MCH: 29.5 pg (ref 26.0–34.0)
MCHC: 32.4 g/dL (ref 30.0–36.0)
MCV: 91 fL (ref 80.0–100.0)
Platelets: 465 10*3/uL — ABNORMAL HIGH (ref 150–400)
RBC: 4 MIL/uL — ABNORMAL LOW (ref 4.22–5.81)
RDW: 13.8 % (ref 11.5–15.5)
WBC: 7.9 10*3/uL (ref 4.0–10.5)
nRBC: 0.3 % — ABNORMAL HIGH (ref 0.0–0.2)

## 2021-10-10 LAB — GLUCOSE, CAPILLARY
Glucose-Capillary: 154 mg/dL — ABNORMAL HIGH (ref 70–99)
Glucose-Capillary: 181 mg/dL — ABNORMAL HIGH (ref 70–99)

## 2021-10-10 MED ORDER — FONDAPARINUX SODIUM 10 MG/0.8ML ~~LOC~~ SOLN
10.0000 mg | SUBCUTANEOUS | Status: DC
  Administered 2021-10-10: 10 mg via SUBCUTANEOUS
  Filled 2021-10-10: qty 0.8

## 2021-10-10 MED ORDER — ONDANSETRON 4 MG PO TBDP
4.0000 mg | ORAL_TABLET | Freq: Three times a day (TID) | ORAL | 0 refills | Status: AC | PRN
  Filled 2021-10-10: qty 30, 10d supply, fill #0

## 2021-10-10 MED ORDER — ENOXAPARIN SODIUM 120 MG/0.8ML IJ SOSY
1.0000 mg/kg | PREFILLED_SYRINGE | Freq: Two times a day (BID) | INTRAMUSCULAR | 0 refills | Status: DC
  Filled 2021-10-10: qty 8, 5d supply, fill #0
  Filled 2021-10-10: qty 16, 10d supply, fill #0

## 2021-10-10 NOTE — Progress Notes (Signed)
As expected, Interventional Radiology did a fantastic job.  They pulled out a large thrombus in the right leg.  His right leg is not nearly as swollen or tight now.  Its not as painful.  He is able to walk around.  We will now switch him over to Arixtra.  This is daily administration.  He says his wife can do this as she gives him his insulin.  He has not had any lab work back yet today.  He has had no bleeding.  He has had no change in bowel or bladder habits.  He really, really hates the beds.  It really exacerbates his lower back pain.  He has had no cough or shortness of breath.  There is been no chest wall pain.  There has been no fever.  He has had no rashes.  His vital signs show temperature of 97.4.  Pulse 69.  Blood pressure 111/65.  Oxygen saturation is only 86%.  His lungs sound relatively clear bilaterally.  He has good air movement bilaterally.  I do not hear any wheezing.  Cardiac exam regular rate and rhythm.  He has 1/6 systolic ejection murmur.  Abdomen is soft.  He has no fluid wave.  There is no palpable liver or spleen tip.  Extremities shows the swollen right leg but this is much improved.  It is not as tight.  There is some swelling in his feet.  He has decent pulses.  Neurological exam is nonfocal.  Derek [name] underwent a thrombectomy.  His right leg is much better.  He is much more ambulatory.  There is much less pain.  The pain that he has is in the lower back which is chronic.  Again, we will switch him over to Arixtra.  He can have this as an outpatient.  Hopefully, insurance will cover this.  If not, we will have to see about Lovenox.  I know that he is gotten incredible care from all the staff upon [address].  I do appreciate all of their compassion.   Lattie Haw, MD  Psalm 41:3

## 2021-10-10 NOTE — Plan of Care (Signed)

## 2021-10-10 NOTE — Care Management Important Message (Signed)
Important Message  Patient Details  Name: Derek [name] MRN: [id number] Date of Birth: 1945/03/01   Medicare Important Message Given:  Yes     Shelda Altes 10/10/2021, 8:28 AM

## 2021-10-10 NOTE — TOC Benefit Eligibility Note (Signed)
Patient Teacher, English as a foreign language completed.    The patient is currently admitted and upon discharge could be taking fondaparinux (Arixtra) 10 mg/0.8 ml injection.  The current 30 day co-pay is $119.27.   The patient is insured through [place], [place] Patient [place] Patient Advocate Team Direct Number: [phone]  Fax: [phone]

## 2021-10-10 NOTE — Discharge Summary (Signed)
Physician Discharge Summary  Derek [name] JDB:[id number] DOB: 11-02-1945 DOA: 10/06/2021  PCP: [name], MD  Admit date: 10/06/2021 Discharge date: 10/10/2021  Admitted From: Home Disposition:  Home  Recommendations for Outpatient Follow-up:  Follow up with PCP in 1-2 weeks Please obtain BMP/CBC in one week your next doctors visit.  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  Dr. [name] recommended thrombectomy by IR-continue with heparin drip.  Plan to transition to Arixtra if patient able to afford it.   Patient underwent right lower extremity venogram and subsequently catheter directed thrombectomy by IR on 8/19.  Due to cost issues patient and family preferred Lovenox over Arixtra.  Lovenox prescription was sent to his pharmacy and it was confirmed that they were able to fill it. Wife is present at bedside during my discussion and evaluation on the day of discharge. Medically stable for discharge.   Assessment and Plan: Right lower extremity DVT with worsening of swelling failed Eliquis -A small amount of clot in his IVC but filter remain patent on CT -IR consulted, patient underwent venogram and subsequently catheter directed thrombectomy by IR on 8/19. -Dr. [name] following recommended Arixtra at discharge or  Lovenox.  -Case Management consulted to see if patient is able to afford medication Patient will be discharged on Lovenox 120 mg every 12 hours.   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 [name]           Body mass index is 35.88 kg/m.       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  Discharge Exam: Vitals:   10/10/21 0010 10/10/21 0525  BP: 115/62 111/65  Pulse: 68 69  Resp: 16 16  Temp: 97.8 F (36.6 C) (!) 97.4 F (36.3 C)  SpO2:  (!) 86%   Vitals:   10/09/21 1636 10/09/21 1949 10/10/21 0010 10/10/21 0525  BP: 119/71 123/84 115/62 111/65  Pulse: 71  68 69  Resp: '16 17 16 16  ' Temp: 97.6 F (36.4 C) 98 F (36.7 C) 97.8 F (36.6 C) (!) 97.4 F (36.3 C)  TempSrc: Oral Oral Oral Oral  SpO2: 96%   (!) 86%  Weight:      Height:        General: Pt is alert, awake, not in acute distress Cardiovascular: RRR, S1/S2 +, no rubs, no gallops Respiratory: CTA bilaterally, no wheezing, no rhonchi Abdominal: Soft, NT, ND, bowel sounds + Extremities: no edema, no cyanosis  Discharge Instructions   Allergies as of 10/10/2021       Reactions   Ditropan [oxybutynin] Swelling, Other (See Comments)  Quinine Other (See Comments)   Platelets dropped- consumptive coagulopathy   Vibra-tab [doxycycline] Other (See Comments)   Epistaxis    Voltaren [diclofenac] Swelling, Other (See Comments)   Elevated liver enzymes Lip swelling   Zoloft [sertraline] Other (See Comments)   Insomnia Acute urinary retention   Cymbalta [duloxetine Hcl] Other (See Comments)   Night sweats   Morphine  And Related Nausea And Vomiting, Swelling, Other (See Comments)   Facial and lip swelling OK to use IR form of hydrocodone and oxycodone   Oxycontin [oxycodone Hcl] Swelling, Other (See Comments)   Lip swelling OK to use IR form   Tape Other (See Comments)   Skin tears easily   Voltaren [diclofenac Sodium] Other (See Comments)   Elevated liver enzymes   Zohydro Er [hydrocodone Bitartrate Er] Swelling   Facial swelling OK to use IR form   Neurontin [gabapentin] Other (See Comments)   Unknown reaction        Medication List     STOP taking these medications    Eliquis 5 MG Tabs tablet Generic drug: apixaban       TAKE these medications    acetaminophen 500 MG tablet Commonly known as: TYLENOL Take 500 mg by mouth every 6 (six) hours as needed for mild pain or headache.   aspirin EC 81 MG tablet Take 81 mg by mouth daily. Swallow whole.   carvedilol 25 MG tablet Commonly known as: COREG Take 1 tablet by mouth once a day every evening   diphenhydramine-acetaminophen 25-500 MG Tabs tablet Commonly known as: TYLENOL PM Take 2 tablets by mouth at bedtime.   enoxaparin 120 MG/0.8ML injection Commonly known as: LOVENOX Inject 0.8 mLs (120 mg total) into the skin 2 (two) times daily for 15 days. Notes to patient: Start this medication on 8/22 in the morning   fentaNYL 25 MCG/HR Commonly known as: [place] 1 patch onto the skin every 3 (three) days.   furosemide 20 MG tablet Commonly known as: LASIX Take 20 mg by mouth daily as needed for edema.   Fusion Plus Caps Take 1 capsule by mouth daily.   Jardiance 25 MG Tabs tablet Generic drug: empagliflozin Take 1 tablet (25 mg total) by mouth daily.   lansoprazole 15 MG capsule Commonly known as: PREVACID Take 15 mg by mouth daily as needed (for breakthrough GERD). What changed: Another medication with the same name was changed. Make sure you understand how and when to take each.   lansoprazole 30 MG  capsule Commonly known as: PREVACID TAKE 1 CAPSULE BY MOUTH ONCE DAILY What changed:  how much to take how to take this when to take this   lidocaine 5 % Commonly known as: LIDODERM Place 1 patch onto the skin daily. Remove & Discard patch within 12 hours or as directed by MD   losartan 50 MG tablet Commonly known as: COZAAR Take 1 tablet by mouth once every evening   magnesium oxide 400 MG tablet Commonly known as: MAG-OX Take 800 mg by mouth at bedtime.   NovoLOG 100 UNIT/ML injection Generic drug: insulin aspart USE 100U/DAY SUBCUTANEOUS VIA PUMP 30 DAY(S)   nystatin 100000 UNIT/ML suspension Commonly known as: MYCOSTATIN Use 4-5 mL for Mouth/Throat as directed four times a day   ondansetron 4 MG disintegrating tablet Commonly known as: ZOFRAN-ODT Take 1 tablet (4 mg total) by mouth every 8 (eight) hours as needed for nausea or vomiting.   OVER THE COUNTER MEDICATION Apply 1 application  topically 2 (  two) times daily as needed (pain). [place] Dream cream   oxybutynin 5 MG tablet Commonly known as: DITROPAN Take 1 tablet (5 mg total) by mouth every 6 (six) to 8 (eight) hours as needed for urinary urgency/frequency/leakage. What changed: when to take this   pravastatin 20 MG tablet Commonly known as: PRAVACHOL Take 1 tablet by mouth twice a week What changed:  how much to take how to take this when to take this additional instructions   tamsulosin 0.4 MG Caps capsule Commonly known as: FLOMAX take 1 capsule by mouth at bedtime   triamcinolone cream 0.1 % Commonly known as: KENALOG Apply externally two times a day for 30 days. What changed:  when to take this reasons to take this   Trulicity 3 CZ/6.6AY Sopn Generic drug: Dulaglutide Inject 0.5 mLs (3 mg total) into the skin every 7 days. What changed:  how much to take how to take this when to take this        Follow-up Information     Michaelle Birks, MD Follow up.   Specialties:  Interventional Radiology, Diagnostic Radiology, Radiology Why: IR scheduler will call you to setup follow up appointment with Dr. Maryelizabeth Kaufmann about 1 month after your procedure. Please call [phone] or [phone] with questions or concerns. Contact information: [address] [place] [address] [phone]         [name], MD Follow up in 6 week(s).   Specialty: Family Medicine Contact information: [address] [place] [address] [phone]                Allergies  Allergen Reactions   Ditropan [Oxybutynin] Swelling and Other (See Comments)   Quinine Other (See Comments)    Platelets dropped- consumptive coagulopathy     Vibra-Tab [Doxycycline] Other (See Comments)    Epistaxis     Voltaren [Diclofenac] Swelling and Other (See Comments)    Elevated liver enzymes Lip swelling    Zoloft [Sertraline] Other (See Comments)    Insomnia Acute urinary retention   Cymbalta [Duloxetine Hcl] Other (See Comments)    Night sweats   Morphine And Related Nausea And Vomiting, Swelling and Other (See Comments)    Facial and lip swelling OK to use IR form of hydrocodone and oxycodone   Oxycontin [Oxycodone Hcl] Swelling and Other (See Comments)    Lip swelling OK to use IR form   Tape Other (See Comments)    Skin tears easily   Voltaren [Diclofenac Sodium] Other (See Comments)    Elevated liver enzymes   Zohydro Er [Hydrocodone Bitartrate Er] Swelling    Facial swelling OK to use IR form   Neurontin [Gabapentin] Other (See Comments)    Unknown reaction     You were cared for by a hospitalist during your hospital stay. If you have any questions about your discharge medications or the care you received while you were in the hospital after you are discharged, you can call the unit and asked to speak with the hospitalist on call if the hospitalist that took care of you is not available. Once you are discharged, your primary care physician  will handle any further medical issues. Please note that no refills for any discharge medications will be authorized once you are discharged, as it is imperative that you return to your primary care physician (or establish a relationship with a primary care physician if you do not have one) for your aftercare needs so that they can  reassess your need for medications and monitor your lab values.   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 Known RIGHT lower extremity DVT and on on Eliquis with worsening swelling. EXAM: Procedures: 1. ULTRASOUND GUIDANCE FOR RIGHT POPLITEAL VEIN ACCESS 2. RIGHT LOWER EXTREMITY VENOGRAM and MECHANICAL THROMBECTOMY for DVT COMPARISON:  CT venogram, 10/06/2021. Lower extremity duplex, 10/04/2021 and 09/12/2021. MEDICATIONS: 2 g Ancef IV administered as preprocedure antibiotics. 3K IU heparin IV. CONTRAST:  71m OMNIPAQUE IOHEXOL 300 MG/ML  SOLN ANESTHESIA/SEDATION: Moderate (conscious) sedation was employed during this procedure. A total of Versed 4 mg and Fentanyl 200 mcg was administered intravenously. Moderate Sedation Time: 48 minutes. The patient's level of consciousness and vital signs were monitored continuously by radiology nursing throughout the procedure under my direct supervision. FLUOROSCOPY TIME:  Fluoroscopic dose; 510.9mGy COMPLICATIONS: None immediate. TECHNIQUE: Informed written consent was obtained from the patient after a discussion of the risks, benefits and alternatives to treatment. Questions regarding the procedure were encouraged and answered. A timeout was performed prior to the initiation of the procedure. The patient was placed supine on the fluoroscopy table and the skin posterior to the RIGHT knee was prepped and draped  in the usual sterile fashion, and a sterile drape was applied covering the operative field. Maximum barrier sterile technique with sterile gowns and gloves were used for the procedure. Under direct ultrasound guidance, the RIGHT popliteal vein was access with a micropuncture kit after the overlying soft tissues were anesthetized with 1% lidocaine. An ultrasound image was saved for documentation purposed. This allowed for placement of a 5 Fr vascular sheath. A limited venogram was performed through the side arm of the vascular sheath. With the use of a glidewire, a Kumpe catheter was advanced to the RIGHT external iliac vein. Limited venograms were performed in multiple stations, up to the level of the pelvic veins. The sheath was exchanged for a 12 Fr 30 cm Brite tip sheath, then a Penumbra 12 Fr lightning catheter was used to perform serial mechanical thrombectomy from the popliteal vein to the level of the common femoral vein. Follow-up venograms demonstrated adequate clearance. Images were saved and will be uploaded PACS. The wire and vascular sheath removed. A purse-string suture and manual compression at the posterior RIGHT knee were performed for hemostasis. A dressing was placed. The patient tolerated the procedure well without immediate postprocedural complication. FINDINGS: 1. Initial ultrasound scanning demonstrates the RIGHT popliteal vein is enlarged and occluded with echogenic and non compressible thrombus. 2. Extensive RIGHT lower extremity femoropopliteal DVT, with central extension to the site of RIGHT common femoral vein. 3. Technique successful catheter-directed aspiration mechanical thrombectomy, with venography findings of eccentric femoral vein defects consistent with chronic DVT. IMPRESSION: Successful RIGHT lower extremity venogram and catheter-directed mechanical thrombectomy for extensive femoropopliteal DVT, as above. PLAN: 1. Continue systemic anticoagulation with heparin gtt. Management  per pharmacy and medicine teams. 2. [facility] from a vascular interventional radiology (VIR) perspective. 3. The patient will be seen in clinic by me for follow-up and repeat RIGHT lower extremity venous duplex in 1 month. JMichaelle Birks MD [facility] Hospital[facility] Electronically Signed   By: JMichaelle BirksM.D.   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 Known RIGHT lower extremity DVT and on on Eliquis with worsening swelling. EXAM: Procedures: 1. ULTRASOUND GUIDANCE FOR RIGHT POPLITEAL VEIN ACCESS 2. RIGHT LOWER EXTREMITY VENOGRAM and MECHANICAL THROMBECTOMY for DVT COMPARISON:  CT venogram, 10/06/2021. Lower extremity duplex, 10/04/2021 and 09/12/2021. MEDICATIONS: 2 g Ancef IV administered as preprocedure antibiotics. 3K IU heparin IV. CONTRAST:  75m OMNIPAQUE IOHEXOL 300 MG/ML  SOLN ANESTHESIA/SEDATION: Moderate (conscious) sedation was employed during this procedure. A total of Versed 4 mg and Fentanyl 200 mcg was administered intravenously. Moderate Sedation Time: 48 minutes. The patient's level of consciousness and vital signs were monitored continuously by radiology nursing throughout the procedure under my direct supervision. FLUOROSCOPY TIME:  Fluoroscopic dose; 501.7mGy COMPLICATIONS: None immediate. TECHNIQUE: Informed written consent was obtained from the patient after a discussion of the risks, benefits and alternatives to treatment. Questions regarding the procedure were encouraged and answered. A timeout was performed prior to the initiation of the procedure. The patient was placed supine on the fluoroscopy table and the skin posterior to the RIGHT knee was prepped and draped in the  usual sterile fashion, and a sterile drape was applied covering the operative field. Maximum barrier sterile technique with sterile gowns and gloves were used for the procedure. Under direct ultrasound guidance, the RIGHT popliteal vein was access with a micropuncture kit after the overlying soft tissues were anesthetized with 1% lidocaine. An ultrasound image was saved for documentation purposed. This allowed for placement of a 5 Fr vascular sheath. A limited venogram was performed through the side arm of the vascular sheath. With the use of a glidewire, a Kumpe catheter was advanced to the RIGHT external iliac vein. Limited venograms were performed in multiple stations, up to the level of the pelvic veins. The sheath was exchanged for a 12 Fr 30 cm Brite tip sheath, then a Penumbra 12 Fr lightning catheter was used to perform serial mechanical thrombectomy from the popliteal vein to the level of the common femoral vein. Follow-up venograms demonstrated adequate clearance. Images were saved and will be uploaded PACS. The wire and vascular sheath removed. A purse-string suture and manual compression at the posterior RIGHT knee were performed for hemostasis. A dressing was placed. The patient tolerated the procedure well without immediate postprocedural complication. FINDINGS: 1. Initial ultrasound scanning demonstrates the RIGHT popliteal vein is enlarged and occluded with echogenic and non compressible thrombus. 2. Extensive RIGHT lower extremity femoropopliteal DVT, with central extension to the site of RIGHT common femoral vein. 3. Technique successful catheter-directed aspiration mechanical thrombectomy, with venography findings of eccentric femoral vein defects consistent with chronic DVT. IMPRESSION: Successful RIGHT lower extremity venogram and catheter-directed mechanical thrombectomy for extensive femoropopliteal DVT, as above. PLAN: 1. Continue systemic anticoagulation with heparin gtt. Management per  pharmacy and medicine teams. 2. Okay to transitioned [facility] to discharge from a vascular interventional radiology (VIR) perspective. 3. The patient will be seen in clinic by me for follow-up and repeat RIGHT lower extremity venous duplex in 1 month. JMichaelle Birks MD [facility] HospitalRad[facility] Electronically Signed   By: JMichaelle BirksM.D.   On: 10/08/2021 16:05   CT VENOGRAM ABD/PELVIS/LOWER EXT BILAT  Result Date: 10/06/2021 CLINICAL DATA:  Right leg pain with known occlusive DVT. EXAM: CT VENOGRAM ABDOMEN AND PELVIS AND LOWER EXTREMITY BILATERAL  TECHNIQUE: CT imaging of the abdomen pelvis and bilateral lower extremities performed following administration of intravenous contrast. RADIATION DOSE REDUCTION: This exam was performed according to the departmental dose-optimization program which includes automated exposure control, adjustment of the mA and/or kV according to patient size and/or use of iterative reconstruction technique. CONTRAST:  174m OMNIPAQUE IOHEXOL 350 MG/ML SOLN COMPARISON:  Right lower extremity DVT ultrasound 10/04/2021 FINDINGS: Lower chest: Large sliding hiatal hernia. Mild dependent atelectasis. No focal opacity. Hepatobiliary: No focal liver abnormality is seen. No gallstones, gallbladder wall thickening, or biliary dilatation. Pancreas: Surgical changes of prior distal pancreatectomy. The remaining pancreatic tissue demonstrates no evidence of mass or inflammatory changes. Spleen: Surgically absent. Adrenals/Urinary Tract: Unremarkable adrenal glands. No hydronephrosis, nephrolithiasis or enhancing renal mass. No evidence of hydronephrosis. The ureters are normal. Diffuse mild thickening of the bladder wall with a right-sided Peri ureteral diverticulum. Stomach/Bowel: No focal bowel wall thickening or evidence of obstruction. Vascular/Lymphatic: Scattered atherosclerotic plaque along the abdominal aorta. Mild tortuosity. No evidence of  aneurysm or dissection. Venous structures are well evaluated. The right common femoral vein is patent as is the saphenofemoral junction. DVT begins proximally in the right femoral vein. Reproductive: Surgical changes of prior TURP. Other: No abdominal wall hernia or abnormality. No abdominopelvic ascites. Musculoskeletal: No acute fracture or aggressive appearing lytic or blastic osseous lesion. Multilevel degenerative disc disease. IVC: A Bard Denali IVC filter is present within the infrarenal IVC. There is a small amount of thrombus within the nose cone of the catheter. The IVC remains patent. Portal and mesenteric veins: Widely patent and unremarkable. Bilateral iliac veins: Both iliac veins are patent without evidence of venous compression or obstruction. Right lower extremity: The right common femoral vein is widely patent. The saphenofemoral junction is patent. No evidence of thrombus in the profunda femoral vein. Thrombus begins in the proximal femoral vein in the upper thigh and extends throughout the femoral vein and into the popliteal vein. Evaluation of the popliteal vein is slightly limited due to knee arthroplasty prosthesis. The calf veins appear largely patent. Left lower extremity: No evidence of deep venous thrombosis in the left lower extremity. IMPRESSION: 1. Positive for DVT within the right femoral vein beginning at the vein origin in the upper thigh and extending through the popliteal vein. 2. No evidence of extension into the iliac venous system or IVC. 3. No evidence of iliac vein compression physiology. 4. Bard Denali IVC filter present in the infrarenal IVC with a small amount of nonocclusive thrombus in the nose cone. 5. Moderately large sliding hiatal hernia. 6.  Aortic Atherosclerosis (ICD10-I70.0). 7. 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 8. Additional ancillary findings as above. Electronically Signed   By: HJacqulynn CadetM.D.   On: 10/06/2021 14:59   [place] Venous Img Lower Unilateral Right (DVT)  Result Date: 10/04/2021 CLINICAL DATA:  Right lower extremity swelling. EXAM: Right LOWER EXTREMITY VENOUS DOPPLER ULTRASOUND TECHNIQUE: Gray-scale sonography with graded compression, as well as color Doppler and duplex ultrasound were performed to evaluate the lower extremity deep venous systems from the level of the common femoral vein and including the common femoral, femoral, profunda femoral, popliteal and calf veins including the posterior tibial, peroneal and gastrocnemius veins when visible. The superficial great saphenous vein was also interrogated. Spectral Doppler was utilized to evaluate flow at rest and with distal augmentation maneuvers in the common femoral, femoral and popliteal veins. COMPARISON:  September 12, 2021. FINDINGS: Contralateral Common Femoral Vein: Respiratory phasicity is normal and  symmetric with the symptomatic side. No evidence of thrombus. Normal compressibility. Common Femoral Vein: No evidence of thrombus. Normal compressibility, respiratory phasicity and response to augmentation. Saphenofemoral Junction: No evidence of thrombus. Normal compressibility and flow on color Doppler imaging. Profunda Femoral Vein: No evidence of thrombus. Normal compressibility and flow on color Doppler imaging. Femoral Vein: Noncompressible with no flow consistent with occlusive thrombus. Popliteal Vein: Noncompressible with no flow consistent with occlusive thrombus. Calf Veins: Posterior tibial and peroneal veins are noncompressible with no flow consistent with occlusive thrombus. Superficial Great Saphenous Vein: No evidence of thrombus. Normal compressibility. Venous Reflux:  None. Other Findings:  None. IMPRESSION: Occlusive deep venous thrombosis is noted in the right femoral, popliteal, posterior tibial and peroneal veins. These results will be called to the ordering clinician or representative by the Radiologist Assistant, and communication documented in  the PACS or zVision Dashboard. Electronically Signed   By: Marijo Conception M.D.   On: 10/04/2021 11:36   US Venous Img Lower Unilateral Right  Result Date: 09/12/2021 CLINICAL DATA:  Known deep venous thrombosis in right leg without improvement after blood thinners. EXAM: RIGHT LOWER EXTREMITY VENOUS DOPPLER ULTRASOUND TECHNIQUE: Gray-scale sonography with compression, as well as color and duplex ultrasound, were performed to evaluate the deep venous system(s) from the level of the common femoral vein through the popliteal and proximal calf veins. COMPARISON:  09/02/2021 FINDINGS: VENOUS Nonocclusive thrombus within the right common femoral vein. Occlusive thrombus within the right femoral vein. Nonocclusive thrombus within the right popliteal vein. Normal findings within the profunda femoral vein and saphenofemoral junction. Calf veins not visualized. Limited views of the contralateral common femoral vein are unremarkable. OTHER None. Limitations: none IMPRESSION: Thrombus within the right common femoral, femoral, popliteal veins as detailed above. Electronically Signed   By: Abigail Miyamoto M.D.   On: 09/12/2021 21:25     The results of significant diagnostics from this hospitalization (including imaging, microbiology, ancillary and laboratory) are listed below for reference.     Microbiology: No results found for this or any previous visit (from the past 240 hour(s)).   Labs: BNP (last 3 results) No results for input(s): "BNP" in the last 8760 hours. Basic Metabolic Panel: Recent Labs  Lab 10/06/21 1330 10/07/21 0223 10/08/21 0245 10/09/21 0357 10/10/21 0908  NA 134* 134* 136 133* 133*  K 4.3 4.3 4.0 4.1 4.3  CL 104 105 106 104 101  CO2 22 23 21* 20* 22  GLUCOSE 236* 168* 122* 163* 221*  BUN '18 15 13 15 15  ' CREATININE 1.23 1.23 1.19 1.10 1.13  CALCIUM 9.9 9.9 10.0 10.0 10.4*   Liver Function Tests: Recent Labs  Lab 10/05/21 1529  AST 13*  ALT 14  ALKPHOS 93  BILITOT 0.3  PROT  7.3  ALBUMIN 4.1   No results for input(s): "LIPASE", "AMYLASE" in the last 168 hours. No results for input(s): "AMMONIA" in the last 168 hours. CBC: Recent Labs  Lab 10/05/21 1529 10/05/21 1947 10/06/21 1330 10/07/21 0223 10/08/21 0245 10/09/21 0357 10/10/21 0908  WBC 6.9   < > 6.8 8.7 8.5 8.2 7.9  NEUTROABS 3.1  --   --   --  3.1  --   --   HGB 12.5*   < > 12.4* 11.9* 11.7* 11.7* 11.8*  HCT 38.9*   < > 38.4* 36.2* 36.4* 35.4* 36.4*  MCV 91.3   < > 90.6 91.4 91.7 90.8 91.0  PLT 468*   < > 502* 438* 431* 378 465*   < > =  values in this interval not displayed.   Cardiac Enzymes: No results for input(s): "CKTOTAL", "CKMB", "CKMBINDEX", "TROPONINI" in the last 168 hours. BNP: Invalid input(s): "POCBNP" CBG: Recent Labs  Lab 10/09/21 1108 10/09/21 1634 10/09/21 1947 10/10/21 0014 10/10/21 0415  GLUCAP 234* 252* 243* 181* 154*   D-Dimer No results for input(s): "DDIMER" in the last 72 hours. Hgb A1c No results for input(s): "HGBA1C" in the last 72 hours. Lipid Profile No results for input(s): "CHOL", "HDL", "LDLCALC", "TRIG", "CHOLHDL", "LDLDIRECT" in the last 72 hours. Thyroid function studies No results for input(s): "TSH", "T4TOTAL", "T3FREE", "THYROIDAB" in the last 72 hours.  Invalid input(s): "FREET3" Anemia work up No results for input(s): "VITAMINB12", "FOLATE", "FERRITIN", "TIBC", "IRON", "RETICCTPCT" in the last 72 hours. Urinalysis    Component Value Date/Time   COLORURINE AMBER (A) 12/10/2020 1655   APPEARANCEUR CLOUDY (A) 12/10/2020 1655   LABSPEC 1.025 12/10/2020 1655   PHURINE 7.0 12/10/2020 1655   GLUCOSEU >=500 (A) 12/10/2020 1655   HGBUR LARGE (A) 12/10/2020 1655   BILIRUBINUR NEGATIVE 12/10/2020 1655   BILIRUBINUR negative 07/04/2011 1902   KETONESUR NEGATIVE 12/10/2020 1655   PROTEINUR 100 (A) 12/10/2020 1655   UROBILINOGEN 0.2 05/22/2013 0924   NITRITE NEGATIVE 12/10/2020 1655   LEUKOCYTESUR SMALL (A) 12/10/2020 1655   Sepsis  Labs Recent Labs  Lab 10/07/21 0223 10/08/21 0245 10/09/21 0357 10/10/21 0908  WBC 8.7 8.5 8.2 7.9   Microbiology No results found for this or any previous visit (from the past 240 hour(s)).   Time coordinating discharge:  I have spent 35 minutes face to face with the patient and on the ward discussing the patients care, assessment, plan and disposition with other care givers. >50% of the time was devoted counseling the patient about the risks and benefits of treatment/Discharge disposition and coordinating care.   SIGNED:   Damita Lack, MD  [facility] 10/10/2021, 1:09 PM   If 7PM-7AM, please contact night-coverage

## 2021-10-10 NOTE — Telephone Encounter (Signed)
Pharmacy Patient Advocate Encounter  Insurance verification completed.    The patient is insured through [employer]   The patient is currently admitted and ran test claims for the following: fondaparinux (Arixtra) 10 mg/0.8 ml injection.  Copays and coinsurance results were relayed to Inpatient clinical team.

## 2021-10-10 NOTE — TOC Benefit Eligibility Note (Signed)
Patient Teacher, English as a foreign language completed.    The patient is currently admitted and upon discharge could be taking enoxaparin (Lovenox) 120 mg/0.8 ml.  The current 15 day co-pay is $13.99.   The patient is insured through [place], [place] Patient [place] Patient Advocate Team Direct Number: [phone]  Fax: [phone]

## 2021-10-11 LAB — SURGICAL PATHOLOGY

## 2021-10-12 ENCOUNTER — Other Ambulatory Visit (HOSPITAL_COMMUNITY): Payer: Self-pay | Admitting: Internal Medicine

## 2021-10-12 ENCOUNTER — Encounter (HOSPITAL_COMMUNITY): Payer: Self-pay

## 2021-10-12 DIAGNOSIS — Z7901 Long term (current) use of anticoagulants: Secondary | ICD-10-CM | POA: Diagnosis not present

## 2021-10-12 DIAGNOSIS — I8290 Acute embolism and thrombosis of unspecified vein: Secondary | ICD-10-CM

## 2021-10-12 DIAGNOSIS — Z09 Encounter for follow-up examination after completed treatment for conditions other than malignant neoplasm: Secondary | ICD-10-CM | POA: Diagnosis not present

## 2021-10-12 DIAGNOSIS — Z86718 Personal history of other venous thrombosis and embolism: Secondary | ICD-10-CM | POA: Diagnosis not present

## 2021-10-12 HISTORY — PX: IR VENO/EXT/UNI RIGHT: IMG676

## 2021-10-14 ENCOUNTER — Encounter: Payer: Self-pay | Admitting: Hematology & Oncology

## 2021-10-14 ENCOUNTER — Other Ambulatory Visit: Payer: Self-pay

## 2021-10-14 ENCOUNTER — Inpatient Hospital Stay (HOSPITAL_BASED_OUTPATIENT_CLINIC_OR_DEPARTMENT_OTHER): Payer: HMO | Admitting: Hematology & Oncology

## 2021-10-14 ENCOUNTER — Inpatient Hospital Stay: Payer: HMO

## 2021-10-14 VITALS — BP 110/61 | HR 57 | Temp 97.6°F | Resp 19 | Ht 72.0 in | Wt 265.0 lb

## 2021-10-14 DIAGNOSIS — M7989 Other specified soft tissue disorders: Secondary | ICD-10-CM | POA: Diagnosis not present

## 2021-10-14 DIAGNOSIS — D6851 Activated protein C resistance: Secondary | ICD-10-CM | POA: Diagnosis not present

## 2021-10-14 DIAGNOSIS — I82401 Acute embolism and thrombosis of unspecified deep veins of right lower extremity: Secondary | ICD-10-CM | POA: Diagnosis not present

## 2021-10-14 DIAGNOSIS — Z86718 Personal history of other venous thrombosis and embolism: Secondary | ICD-10-CM | POA: Diagnosis not present

## 2021-10-14 DIAGNOSIS — Z7901 Long term (current) use of anticoagulants: Secondary | ICD-10-CM | POA: Diagnosis not present

## 2021-10-14 DIAGNOSIS — Z8507 Personal history of malignant neoplasm of pancreas: Secondary | ICD-10-CM | POA: Diagnosis not present

## 2021-10-14 DIAGNOSIS — R319 Hematuria, unspecified: Secondary | ICD-10-CM | POA: Diagnosis not present

## 2021-10-14 DIAGNOSIS — D3A8 Other benign neuroendocrine tumors: Secondary | ICD-10-CM | POA: Diagnosis not present

## 2021-10-14 DIAGNOSIS — M545 Low back pain, unspecified: Secondary | ICD-10-CM | POA: Diagnosis not present

## 2021-10-14 LAB — CBC WITH DIFFERENTIAL (CANCER CENTER ONLY)
Abs Immature Granulocytes: 0.02 10*3/uL (ref 0.00–0.07)
Basophils Absolute: 0.2 10*3/uL — ABNORMAL HIGH (ref 0.0–0.1)
Basophils Relative: 2 %
Eosinophils Absolute: 0.5 10*3/uL (ref 0.0–0.5)
Eosinophils Relative: 7 %
HCT: 38.6 % — ABNORMAL LOW (ref 39.0–52.0)
Hemoglobin: 12.4 g/dL — ABNORMAL LOW (ref 13.0–17.0)
Immature Granulocytes: 0 %
Lymphocytes Relative: 37 %
Lymphs Abs: 2.6 10*3/uL (ref 0.7–4.0)
MCH: 29.3 pg (ref 26.0–34.0)
MCHC: 32.1 g/dL (ref 30.0–36.0)
MCV: 91.3 fL (ref 80.0–100.0)
Monocytes Absolute: 1 10*3/uL (ref 0.1–1.0)
Monocytes Relative: 15 %
Neutro Abs: 2.7 10*3/uL (ref 1.7–7.7)
Neutrophils Relative %: 39 %
Platelet Count: 456 10*3/uL — ABNORMAL HIGH (ref 150–400)
RBC: 4.23 MIL/uL (ref 4.22–5.81)
RDW: 13.8 % (ref 11.5–15.5)
Smear Review: NORMAL
WBC Count: 7 10*3/uL (ref 4.0–10.5)
nRBC: 0.3 % — ABNORMAL HIGH (ref 0.0–0.2)

## 2021-10-14 LAB — CMP (CANCER CENTER ONLY)
ALT: 24 U/L (ref 0–44)
AST: 22 U/L (ref 15–41)
Albumin: 4.2 g/dL (ref 3.5–5.0)
Alkaline Phosphatase: 97 U/L (ref 38–126)
Anion gap: 7 (ref 5–15)
BUN: 18 mg/dL (ref 8–23)
CO2: 25 mmol/L (ref 22–32)
Calcium: 10.7 mg/dL — ABNORMAL HIGH (ref 8.9–10.3)
Chloride: 102 mmol/L (ref 98–111)
Creatinine: 1.12 mg/dL (ref 0.61–1.24)
GFR, Estimated: >60 mL/min (ref >60)
Glucose, Bld: 244 mg/dL — ABNORMAL HIGH (ref 70–99)
Potassium: 5 mmol/L (ref 3.5–5.1)
Sodium: 134 mmol/L — ABNORMAL LOW (ref 135–145)
Total Bilirubin: 0.4 mg/dL (ref 0.3–1.2)
Total Protein: 7.4 g/dL (ref 6.5–8.1)

## 2021-10-14 LAB — D-DIMER, QUANTITATIVE: D-Dimer, Quant: 0.83 ug/mL-FEU — ABNORMAL HIGH (ref 0.00–0.50)

## 2021-10-14 NOTE — Progress Notes (Signed)
Hematology and Oncology Follow Up Visit  Derek [name] [id number] 09-Oct-1945 76 y.o. 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  Last time that we saw him, he had marked swelling in his right leg.  He had to be admitted.  He had a large blood clot in the right leg.  He had been on Eliquis.  We stopped the Eliquis and got him on heparin.  In the hospital, interventional radiology, did a fantastic job, and did a thrombectomy on him.  They put a large thrombus in the right leg.  His right leg swelling improved.  He now is on Lovenox.  I really think that he is going need to be on Lovenox long-term.  I do not have any confidence in the Coumadin just because of all the medications that he is taking.  He feels better.  He has had no problems with the Lovenox.  He does the injections himself.  He has had no bleeding.  He has had no change in bowel or bladder habits.  He has had no chest wall pain.  There has been no cough.  Overall, I was his performance status right now is probably ECOG 2.     Medications:  Current Outpatient Medications:    acetaminophen (TYLENOL) 500 MG tablet, Take 500 mg by mouth every 6 (six) hours as needed for mild pain or headache., Disp: , Rfl:    aspirin EC 81 MG tablet, Take 81 mg by mouth daily. Swallow whole., Disp: , Rfl:    carvedilol (COREG) 25 MG tablet, Take 1 tablet by mouth once a day every evening, Disp: 90 tablet, Rfl: 4   diphenhydramine-acetaminophen (TYLENOL PM) 25-500 MG TABS tablet, Take 2 tablets by mouth at bedtime., Disp: , Rfl:    Dulaglutide (TRULICITY) 3 WU/9.8JX SOPN, Inject 0.5 mLs (3 mg total) into the skin every 7 days. (Patient taking differently: Inject 3 mg into the skin every Monday.), Disp: 6 mL, Rfl: 1   empagliflozin (JARDIANCE) 25 MG TABS  tablet, Take 1 tablet (25 mg total) by mouth daily., Disp: 90 tablet, Rfl: 1   enoxaparin (LOVENOX) 120 MG/0.8ML injection, Inject 0.8 mLs (120 mg total) into the skin 2 (two) times daily for 15 days., Disp: 24 mL, Rfl: 0   fentaNYL (DURAGESIC) 25 MCG/HR, Place 1 patch onto the skin every 3 (three) days., Disp: 5 patch, Rfl: 0   furosemide (LASIX) 20 MG tablet, Take 20 mg by mouth daily as needed for edema., Disp: , Rfl:    lansoprazole (PREVACID) 15 MG capsule, Take 15 mg by mouth daily as needed (for breakthrough GERD)., Disp: , Rfl:    lansoprazole (PREVACID) 30 MG capsule, TAKE 1 CAPSULE BY MOUTH ONCE DAILY (Patient taking differently: Take 30 mg by mouth at bedtime.), Disp: 90 capsule, Rfl: 4   lidocaine (LIDODERM) 5 %, Place 1 patch onto the skin daily. Remove & Discard patch within 12 hours or as directed by MD, Disp: 30 patch, Rfl: 0   losartan (COZAAR) 50 MG tablet, Take 1 tablet by mouth once every evening, Disp: 90 tablet, Rfl: 4   magnesium oxide (MAG-OX) 400 MG tablet, Take 800 mg by mouth at bedtime., Disp: , Rfl:    OVER THE COUNTER MEDICATION, Apply 1 application  topically 2 (two)  times daily as needed (pain). [place] Dream cream, Disp: , Rfl:    oxybutynin (DITROPAN) 5 MG tablet, Take 1 tablet (5 mg total) by mouth every 6 (six) to 8 (eight) hours as needed for urinary urgency/frequency/leakage. (Patient taking differently: Take 5 mg by mouth at bedtime.), Disp: 90 tablet, Rfl: 11   pravastatin (PRAVACHOL) 20 MG tablet, Take 1 tablet by mouth twice a week (Patient taking differently: Take 20 mg by mouth 2 (two) times a week. Monday, Friday), Disp: 40 tablet, Rfl: 4   tamsulosin (FLOMAX) 0.4 MG CAPS capsule, take 1 capsule by mouth at bedtime, Disp: 30 capsule, Rfl: 3   triamcinolone cream (KENALOG) 0.1 %, Apply externally two times a day for 30 days. (Patient taking differently: Apply 1 application  topically daily as needed (irritation).), Disp: 80 g, Rfl: 3   nystatin  (MYCOSTATIN) 100000 UNIT/ML suspension, Use 4-5 mL for Mouth/Throat as directed four times a day (Patient not taking: Reported on 10/07/2021), Disp: 240 mL, Rfl: 3   ondansetron (ZOFRAN-ODT) 4 MG disintegrating tablet, Take 1 tablet (4 mg total) by mouth every 8 (eight) hours as needed for nausea or vomiting. (Patient not taking: Reported on 10/14/2021), Disp: 30 tablet, Rfl: 0  Allergies:  Allergies  Allergen Reactions   Ditropan [Oxybutynin] Swelling and Other (See Comments)   Quinine Other (See Comments)    Platelets dropped- consumptive coagulopathy     Vibra-Tab [Doxycycline] Other (See Comments)    Epistaxis     Voltaren [Diclofenac] Swelling and Other (See Comments)    Elevated liver enzymes Lip swelling    Zoloft [Sertraline] Other (See Comments)    Insomnia Acute urinary retention   Cymbalta [Duloxetine Hcl] Other (See Comments)    Night sweats   Morphine And Related Nausea And Vomiting, Swelling and Other (See Comments)    Facial and lip swelling OK to use IR form of hydrocodone and oxycodone   Oxycontin [Oxycodone Hcl] Swelling and Other (See Comments)    Lip swelling OK to use IR form   Tape Other (See Comments)    Skin tears easily   Voltaren [Diclofenac Sodium] Other (See Comments)    Elevated liver enzymes   Zohydro Er [Hydrocodone Bitartrate Er] Swelling    Facial swelling OK to use IR form   Neurontin [Gabapentin] Other (See Comments)    Unknown reaction     Past Medical History, Surgical history, Social history, and Family History were reviewed and updated.  Review of Systems: Review of Systems  Constitutional:  Positive for fatigue.  HENT:  Negative.    Eyes: Negative.   Respiratory: Negative.    Cardiovascular:  Positive for leg swelling.  Gastrointestinal: Negative.   Endocrine: Negative.   Genitourinary:  Positive for hematuria.   Musculoskeletal:  Positive for back pain, myalgias and neck pain.  Neurological:  Positive for dizziness and  headaches.  Hematological: Negative.   Psychiatric/Behavioral: Negative.      Physical Exam:  height is 6' (1.829 m) and weight is 265 lb (120.2 kg). His oral temperature is 97.6 F (36.4 C). His blood pressure is 110/61 and his pulse is 57 (abnormal). His respiration is 19 and oxygen saturation is 99%.   Wt Readings from Last 3 Encounters:  10/14/21 265 lb (120.2 kg)  10/07/21 264 lb 8.8 oz (120 kg)  10/05/21 268 lb (121.6 kg)    Physical Exam Vitals reviewed.  HENT:     Head: Normocephalic and atraumatic.  Eyes:     Pupils: Pupils are  equal, round, and reactive to light.  Cardiovascular:     Rate and Rhythm: Normal rate and regular rhythm.     Heart sounds: Normal heart sounds.  Pulmonary:     Effort: Pulmonary effort is normal.     Breath sounds: Normal breath sounds.  Abdominal:     General: Bowel sounds are normal.     Palpations: Abdomen is soft.  Musculoskeletal:        General: No tenderness or deformity. Normal range of motion.     Cervical back: Normal range of motion.     Comments: His extremities does show some mild swelling in the right leg.  It is not as firm.  He has good warmth to the leg.  He has palpable pulses in the distal extremity.    Lymphadenopathy:     Cervical: No cervical adenopathy.  Skin:    General: Skin is warm and dry.     Findings: No erythema or rash.  Neurological:     Mental Status: He is alert and oriented to person, place, and time.  Psychiatric:        Behavior: Behavior normal.        Thought Content: Thought content normal.        Judgment: Judgment normal.      Lab Results  Component Value Date   WBC 7.0 10/14/2021   HGB 12.4 (L) 10/14/2021   HCT 38.6 (L) 10/14/2021   MCV 91.3 10/14/2021   PLT 456 (H) 10/14/2021     Chemistry      Component Value Date/Time   NA 134 (L) 10/14/2021 1454   K 5.0 10/14/2021 1454   CL 102 10/14/2021 1454   CO2 25 10/14/2021 1454   BUN 18 10/14/2021 1454   CREATININE 1.12 10/14/2021  1454      Component Value Date/Time   CALCIUM 10.7 (H) 10/14/2021 1454   ALKPHOS 97 10/14/2021 1454   AST 22 10/14/2021 1454   ALT 24 10/14/2021 1454   BILITOT 0.4 10/14/2021 1454       Impression and Plan: Mr. Folson is a very nice 76 year old white male.  He has had incredible history.  He has had a past history of a neuroendocrine pancreatic tumor.  He apparently was hospitalized for 4 months because of complications.  I am so glad that he has a thrombectomy.  Again I think the problem is that he has his IVC filter in.  I am unsure if this can ever come out.  As always, Interventional Radiology really did a wonderful job with him.  I know this was incredibly complicated.  For right now, I am just going to keep him on Lovenox.  I really think that this is the best option for him.  Again with all of his medications, I just do not have confidence that Coumadin is going to be effective.  I think that as long as he has a filter in, he is going need to have aggressive anticoagulation.  I would like to get him back in about 6 weeks.   [name], MD 8/25/20234:29 PM

## 2021-10-18 ENCOUNTER — Other Ambulatory Visit: Payer: Self-pay | Admitting: Hematology & Oncology

## 2021-10-18 ENCOUNTER — Other Ambulatory Visit (HOSPITAL_BASED_OUTPATIENT_CLINIC_OR_DEPARTMENT_OTHER): Payer: Self-pay

## 2021-10-18 MED ORDER — ENOXAPARIN SODIUM 120 MG/0.8ML IJ SOSY
1.0000 mg/kg | PREFILLED_SYRINGE | Freq: Two times a day (BID) | INTRAMUSCULAR | 0 refills | Status: DC
  Filled 2021-10-18: qty 3.2, 2d supply, fill #0
  Filled 2021-10-19: qty 24, 15d supply, fill #0

## 2021-10-19 ENCOUNTER — Encounter: Payer: Self-pay | Admitting: *Deleted

## 2021-10-19 ENCOUNTER — Other Ambulatory Visit (HOSPITAL_BASED_OUTPATIENT_CLINIC_OR_DEPARTMENT_OTHER): Payer: Self-pay

## 2021-10-19 DIAGNOSIS — Z09 Encounter for follow-up examination after completed treatment for conditions other than malignant neoplasm: Secondary | ICD-10-CM | POA: Diagnosis not present

## 2021-10-19 DIAGNOSIS — M1990 Unspecified osteoarthritis, unspecified site: Secondary | ICD-10-CM | POA: Diagnosis not present

## 2021-10-19 DIAGNOSIS — I82501 Chronic embolism and thrombosis of unspecified deep veins of right lower extremity: Secondary | ICD-10-CM | POA: Diagnosis not present

## 2021-10-19 DIAGNOSIS — N644 Mastodynia: Secondary | ICD-10-CM | POA: Diagnosis not present

## 2021-10-19 DIAGNOSIS — R609 Edema, unspecified: Secondary | ICD-10-CM | POA: Diagnosis not present

## 2021-10-19 DIAGNOSIS — E669 Obesity, unspecified: Secondary | ICD-10-CM | POA: Diagnosis not present

## 2021-10-19 NOTE — Progress Notes (Signed)
Approval received for Lovenox via Cover My Meds from 10/19/21-02/19/22.  No reference number provided.

## 2021-10-20 ENCOUNTER — Other Ambulatory Visit (HOSPITAL_BASED_OUTPATIENT_CLINIC_OR_DEPARTMENT_OTHER): Payer: Self-pay

## 2021-10-21 DIAGNOSIS — N644 Mastodynia: Secondary | ICD-10-CM | POA: Diagnosis not present

## 2021-10-25 ENCOUNTER — Other Ambulatory Visit: Payer: HMO

## 2021-10-25 ENCOUNTER — Telehealth: Payer: Self-pay

## 2021-10-25 ENCOUNTER — Other Ambulatory Visit (HOSPITAL_BASED_OUTPATIENT_CLINIC_OR_DEPARTMENT_OTHER): Payer: Self-pay

## 2021-10-25 ENCOUNTER — Other Ambulatory Visit (HOSPITAL_COMMUNITY): Payer: Self-pay | Admitting: Physician Assistant

## 2021-10-25 ENCOUNTER — Other Ambulatory Visit: Payer: Self-pay | Admitting: Interventional Radiology

## 2021-10-25 DIAGNOSIS — I82401 Acute embolism and thrombosis of unspecified deep veins of right lower extremity: Secondary | ICD-10-CM

## 2021-10-25 NOTE — Telephone Encounter (Signed)
Patient wife called asking if patient can be switched to an oral medication instead of the lovenox, discussed with MD who declined switching to orals as he has failed them in the past. Pt to continue on with lovenox injections until his next appt 10/6. Pt wife verbalized understanding and denies any other questions or concerns at this time.

## 2021-10-26 ENCOUNTER — Ambulatory Visit
Admission: RE | Admit: 2021-10-26 | Discharge: 2021-10-26 | Disposition: A | Discharge status: Home / Self Care | Payer: HMO | Source: Ambulatory Visit | Attending: Interventional Radiology | Admitting: Interventional Radiology

## 2021-10-26 ENCOUNTER — Ambulatory Visit
Admission: RE | Admit: 2021-10-26 | Discharge: 2021-10-26 | Disposition: A | Discharge status: Home / Self Care | Payer: HMO | Source: Ambulatory Visit | Attending: Physician Assistant | Admitting: Physician Assistant

## 2021-10-26 ENCOUNTER — Encounter: Payer: Self-pay | Admitting: Radiology

## 2021-10-26 DIAGNOSIS — I82431 Acute embolism and thrombosis of right popliteal vein: Secondary | ICD-10-CM | POA: Diagnosis not present

## 2021-10-26 DIAGNOSIS — I82441 Acute embolism and thrombosis of right tibial vein: Secondary | ICD-10-CM | POA: Diagnosis not present

## 2021-10-26 DIAGNOSIS — I82401 Acute embolism and thrombosis of unspecified deep veins of right lower extremity: Secondary | ICD-10-CM

## 2021-10-26 DIAGNOSIS — I82411 Acute embolism and thrombosis of right femoral vein: Secondary | ICD-10-CM | POA: Diagnosis not present

## 2021-10-26 HISTORY — PX: IR RADIOLOGIST EVAL & MGMT: IMG5224

## 2021-10-26 NOTE — Progress Notes (Signed)
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 Pt had previously been on chronic AC w Eliquis but had since been off for a few months. He had presented for hematology oncology follow up on 10/05/21 and was noted to have increased RLE pain and swelling. US DVT showed extensive occlussive thrombus and Pt was admitted for evaluation and management leading to my involvement in his care.  He underwent RLE venogram and catheter-directed mechanical thrombectomy for extensive DVT on 10/08/21. He was discharged on [facility] with Lovenox and presents for follow up evaluation. Follow up venous doppler demonstrates improved though incompletely resolved RLE femoropopliteal DVT.  He reports that his right leg feels significantly better. It is reportedly similar in girth to his LLE in the AM but progressively swells throughout the day. He is ambulatory and is back to his daily routines. He has not being wearing compression. He is frustrated by his BID Lovenox injections at his abdomen.   Review of Systems: A 12-point ROS discussed, and pertinent positives are indicated in the HPI above.  All other systems are negative.   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" no pulmonary md, sees dr [name]   Factor V Leiden mutation ([place])    "never had blood clots"   GERD  (gastroesophageal reflux disease)    Hiatal hernia    History of kidney stones    multiple kidney stones-not a problem at present   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, pT1 pNX) no further treatment   History of panic attacks    History 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    Past Surgical History:  Procedure Laterality Date   ANAL FISTULECTOMY N/A 04/01/2013   Procedure: EXAM UNDER ANESTHESIA WITH ANAL FISSURectomy, sphincterotomy  and internal hemorrhoidectomy;  Surgeon: Harl Bowie, MD;  Location: [place];  Service: General;  Laterality: N/A;   APPLICATION OF WOUND VAC N/A 05/16/2015   Procedure: APPLICATION OF WOUND VAC;  Surgeon: Armandina Gemma, MD;  [facility];  Service: General;  Laterality: N/A;   APPLICATION OF WOUND VAC N/A 05/20/2015   Procedure: EXHANGE OF ABDOMINAL  WOUND VAC DRESSING;  Surgeon: Armandina Gemma, MD;  Location: [facility];  Service: General;  Laterality: N/A;   COLONOSCOPY     CYSTOSCOPY N/A 05/23/2021   Procedure: Consuela Mimes;  Surgeon: Franchot Gallo, MD;  Location: [facility];  Service: Urology;  Laterality: N/A;    CYSTOSCOPY WITH INSERTION OF UROLIFT N/A 10/12/2016   Procedure: CYSTOSCOPY WITH INSERTION OF UROLIFT;  Surgeon: Franchot Gallo, MD;  Location: [facility];  Service: Urology;  Laterality: N/A;   EUS N/A 12/31/2014   Procedure: UPPER ENDOSCOPIC ULTRASOUND (EUS) LINEAR;  Surgeon: Milus Banister, MD;  [facility];  Service: Endoscopy;  Laterality: N/A;   HARDWARE REMOVAL Left 2013   left ankle   HEMORRHOIDECTOMY WITH HEMORRHOID BANDING     INCISION AND DRAINAGE ABSCESS N/A 05/14/2015   Procedure: DRAINAGE OF INFECTED PANCREATIC PSEUDOCYST;  Surgeon: Armandina Gemma, MD;  [facility];  Service: General;  Laterality: N/A;   INCISION AND DRAINAGE OF WOUND N/A 05/16/2015   Procedure: IRRIGATION AND DEBRIDEMENT WOUND;  Surgeon: Armandina Gemma, MD;  [facility];  Service: General;  Laterality: N/A;   INCISIONAL HERNIA REPAIR N/A 11/12/2015   Procedure: REPAIR INCISIONAL HERNIA WITH MESH;  Surgeon: Armandina Gemma, MD;  Location: [place];  Service: General;  Laterality: N/A;   INGUINAL HERNIA REPAIR Right 1974;  [place] N/A 11/12/2015   Procedure: INSERTION OF MESH;  Surgeon: Armandina Gemma, MD;  Location: [place];  Service: General;  Laterality: N/A;   IR GENERIC HISTORICAL  04/20/2015   IR RADIOLOGIST EVAL & MGMT 04/20/2015 [facility] INTERV RAD   IR IVC FILTER PLMT / S&I /IMG GUID/MOD SED  11/25/2020   IR RADIOLOGIST EVAL & MGMT  10/26/2021   IR RADIOLOGY PERIPHERAL GUIDED IV START  11/25/2020   IR THROMBECT VENO MECH MOD SED  10/08/2021   IR US GUIDE VASC ACCESS RIGHT  11/25/2020   IR US GUIDE VASC ACCESS RIGHT  10/08/2021   IR VENO/EXT/UNI RIGHT  10/12/2021   KNEE ARTHROSCOPY Bilateral right 08-08-2000;  left 11-23-2000   meniscal repair and chondroplasty's   KNEE ARTHROSCOPY  03/01/2012   Procedure: ARTHROSCOPY KNEE;  Surgeon: Gearlean Alf, MD;  Location: [facility];  Service: Orthopedics;  Laterality: Left;  WITH SYNOVECTOMY    LAPAROTOMY N/A  05/14/2015   Procedure: EXPLORATORY LAPAROTOMY;  Surgeon: Armandina Gemma, MD;  [facility];  Service: General;  Laterality: N/A;   MASS EXCISION N/A 12/24/2019   Procedure: EXCISION LIP CARCINOMA;  Surgeon: Leta Baptist, MD;  Location: [place];  Service: ENT;  Laterality: N/A;   ORIF LEFT ANKLE FX  08/20/2009   distal tib-fib and malleolus   ROTATOR CUFF REPAIR Bilateral right 1994; left 1993, left 1993   TOTAL KNEE ARTHROPLASTY Right 06/02/2013   Procedure: RIGHT TOTAL KNEE ARTHROPLASTY;  Surgeon: Gearlean Alf, MD;  [facility];  Service: Orthopedics;  Laterality: Right;   TOTAL KNEE ARTHROPLASTY Left 01-31-2008   dr Wynelle Link   TOTAL KNEE REVISION Right 01/23/2018   Procedure: right knee polyethylene revision;  Surgeon: Gaynelle Arabian,  MD;  [facility];  Service: Orthopedics;  Laterality: Right;  85mn   TRANSURETHRAL RESECTION OF BLADDER TUMOR N/A 05/23/2021   Procedure: TRANSURETHRAL BIOPSY OF BLADDER;  Surgeon: DFranchot Gallo MD;  [facility]  Service: Urology;  Laterality: N/A;   TRANSURETHRAL RESECTION OF PROSTATE N/A 04/21/2019   Procedure: TRANSURETHRAL RESECTION OF THE PROSTATE (TURP);  Surgeon: DFranchot Gallo MD;  [facility]  Service: Urology;  Laterality: N/A;   UMBILICAL HERNIA REPAIR  08-11-1999   dr bNinfa Linden  UPPER GASTROINTESTINAL ENDOSCOPY  sept 2016   WOUND EXPLORATION N/A 09/21/2016   Procedure: WOUND EXPLORATION AND DEBRIDEMENT ABDOMINAL WALL;  Surgeon: GArmandina Gemma MD;  [facility];  Service: General;  Laterality: N/A;    Allergies: Ditropan [oxybutynin], Quinine, Vibra-tab [doxycycline], Voltaren [diclofenac], Zoloft [sertraline], Cymbalta [duloxetine hcl], Morphine and related, Oxycontin [oxycodone hcl], Tape, Voltaren [diclofenac sodium], Zohydro er [hydrocodone bitartrate er], and Neurontin [gabapentin]  Medications: Prior to Admission medications   Medication Sig Start Date End Date Taking?  Authorizing Provider  acetaminophen (TYLENOL) 500 MG tablet Take 500 mg by mouth every 6 (six) hours as needed for mild pain or headache.    [provider]  aspirin EC 81 MG tablet Take 81 mg by mouth daily. Swallow whole.    [provider]  carvedilol (COREG) 25 MG tablet Take 1 tablet by mouth once a day every evening 07/08/21     diphenhydramine-acetaminophen (TYLENOL PM) 25-500 MG TABS tablet Take 2 tablets by mouth at bedtime.    [provider]  Dulaglutide (TRULICITY) 3 MVE/9.3YBSOPN Inject 0.5 mLs (3 mg total) into the skin every 7 days. Patient taking differently: Inject 3 mg into the skin every Monday. 08/16/21     empagliflozin (JARDIANCE) 25 MG TABS tablet Take 1 tablet (25 mg total) by mouth daily. 08/16/21     enoxaparin (LOVENOX) 120 MG/0.8ML injection Inject 0.8 mLs (120 mg total) into the skin 2 (two) times daily for 15 days. 10/18/21 11/05/21  EVolanda Napoleon MD  fentaNYL (DURAGESIC) 25 MCG/HR Place 1 patch onto the skin every 3 (three) days. 70/17/51  GCampbell StallP, DO  furosemide (LASIX) 20 MG tablet Take 20 mg by mouth daily as needed for edema.    [provider]  lansoprazole (PREVACID) 15 MG capsule Take 15 mg by mouth daily as needed (for breakthrough GERD).    [provider]  lansoprazole (PREVACID) 30 MG capsule TAKE 1 CAPSULE BY MOUTH ONCE DAILY Patient taking differently: Take 30 mg by mouth at bedtime. 07/08/21     lidocaine (LIDODERM) 5 % Place 1 patch onto the skin daily. Remove & Discard patch within 12 hours or as directed by MD 12/01/20   UCristal Deer MD  losartan (COZAAR) 50 MG tablet Take 1 tablet by mouth once every evening 07/08/21     magnesium oxide (MAG-OX) 400 MG tablet Take 800 mg by mouth at bedtime.    [provider]  nystatin (MYCOSTATIN) 100000 UNIT/ML suspension Use 4-5 mL for Mouth/Throat as directed four times a day Patient not taking: Reported on 10/07/2021 07/08/21     ondansetron  (ZOFRAN-ODT) 4 MG disintegrating tablet Take 1 tablet (4 mg total) by mouth every 8 (eight) hours as needed for nausea or vomiting. Patient not taking: Reported on 10/14/2021 10/10/21   ADamita Lack MD  OVER THE COUNTER MEDICATION Apply 1 application  topically 2 (two) times daily as needed (pain). ACubaDream cream  [provider]  oxybutynin (DITROPAN) 5 MG tablet Take 1 tablet (5 mg total) by mouth every 6 (six) to 8 (eight) hours as needed for urinary urgency/frequency/leakage. Patient taking differently: Take 5 mg by mouth at bedtime. 05/26/21     pravastatin (PRAVACHOL) 20 MG tablet Take 1 tablet by mouth twice a week Patient taking differently: Take 20 mg by mouth 2 (two) times a week. Monday, Friday 07/08/21     tamsulosin (FLOMAX) 0.4 MG CAPS capsule take 1 capsule by mouth at bedtime 09/19/21     triamcinolone cream (KENALOG) 0.1 % Apply externally two times a day for 30 days. Patient taking differently: Apply 1 application  topically daily as needed (irritation). 10/07/20        Family History  Problem Relation Age of Onset   Bone cancer Father        Jaw   Factor V Leiden deficiency Daughter    Diabetes Maternal Grandmother    Heart disease Paternal Uncle    Colon cancer Neg Hx    Esophageal cancer Neg Hx    Stomach cancer Neg Hx    Rectal cancer Neg Hx     Social History   Socioeconomic History   Marital status: Married    Spouse name: Ivin Booty   Number of children: 2   Years of education: Not on file   Highest education level: Not on file  Occupational History   Occupation: retired   Tobacco Use   Smoking status: Former    Packs/day: 4.00    Years: 17.00    Total pack years: 68.00    Types: Cigarettes    Quit date: 02/21/1975    Years since quitting: 46.7   Smokeless tobacco: Never  Vaping Use   Vaping Use: Never used  Substance and Sexual Activity   Alcohol use: No    Alcohol/week: 0.0 standard drinks of alcohol   Drug use: No   Sexual  activity: Yes  Other Topics Concern   Not on file  Social History Narrative   Married, retired Scientist, product/process development   1 son 1 daughter   2 caffeine drinks/day   10/18/2014   Social Determinants of Health   Financial Resource Strain: Not on file  Food Insecurity: No Food Insecurity (12/22/2019)   Hunger Vital Sign    Worried About Running Out of Food in the Last Year: Never true    [place] in the Last Year: Never true  Transportation Needs: No Transportation Needs (12/22/2019)   PRAPARE - Hydrologist (Medical): No    Lack of Transportation (Non-Medical): No  Physical Activity: Not on file  Stress: Not on file  Social Connections: Not on file    Vital Signs: There were no vitals taken for this visit.  Physical Exam  General: WN, NAD  CV: RRR Pulm: normal work of breathing on RA Abd: S, rotund, NT MSK: comparatively > girth of RLE. No evidence of venostasis. Psych: Appropriate affect.   Imaging: US Venous Img Lower Unilateral Right (DVT)  Result Date: 10/26/2021 CLINICAL DATA:  History of factor V Leiden and RIGHT lower extremity DVT. RIGHT lower extremity mechanical thrombectomy on 10/08/2021. EXAM: RIGHT LOWER EXTREMITY VENOUS DOPPLER ULTRASOUND TECHNIQUE: Gray-scale sonography with compression, as well as color and duplex ultrasound, were performed to evaluate the deep venous system(s) from the level of the common femoral vein through the popliteal and proximal calf veins. COMPARISON:  RIGHT lower extremity duplex, 10/04/2021. CT venogram, 10/06/2021.  IR fluoroscopy, 10/08/2021. FINDINGS: VENOUS Normal compressibility of the common femoral and visualized portions of the profunda femoral and greater saphenous veins. Partially-occlusive filling defect with incomplete coaptation within the imaged portions of the femoral popliteal, posterior tibial and peroneal veins. Limited views of the contralateral common femoral vein are unremarkable. OTHER No evidence  of superficial thrombophlebitis or abnormal fluid collection. Mild-to-moderate subcutaneous edema of the imaged distal RIGHT lower extremity. Limitations: none IMPRESSION: Improved though incompletely resolved RIGHT lower extremity femoropopliteal DVT, with residual partially-occlusive venous filling defects, as described above. Michaelle Birks, MD [facility] Electronically Signed   By: Michaelle Birks M.D.   On: 10/26/2021 15:09    Labs:  CBC: Recent Labs    10/08/21 0245 10/09/21 0357 10/10/21 0908 10/14/21 1454  WBC 8.5 8.2 7.9 7.0  HGB 11.7* 11.7* 11.8* 12.4*  HCT 36.4* 35.4* 36.4* 38.6*  PLT 431* 378 465* 456*    COAGS: Recent Labs    11/25/20 0511 10/06/21 1330 10/07/21 0223 10/07/21 2114 10/08/21 0245 10/09/21 0357 10/09/21 1455  INR 1.3* 1.3*  --   --   --   --   --   APTT  --  35   < > 100* 100* 107* 78*   < > = values in this interval not displayed.    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 Pt known to me s/p RLE venogram and mechanical thrombectomy for extensive DVT on 10/08/21.  Incomplete resolution of RLE DVT with partially-occlusive residual on today's venous duplex.  Mildy symptomatic, through significantly reduced swelling.  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 Level of support; firm Indication; following DVT, post-surgery  *No additional VIR intervention. *AC per Heme Onc, consider conversion to PO AC. *Pt will follow up with me in Clinic, and for repeat RLE Venous Duplex in 3 months.   Thank you for allowing [place] to participate in the care of this Patient. Please contact me with questions, concerns, or if new issues arise.  Electronically Signed:  Michaelle Birks, MD [facility] Radiology   Pager. [phone] Clinic. [phone]  I spent a total of 25 Minutes in face to face in clinical consultation, greater than 50% of which was counseling/coordinating care for Mr Derek [name]'s extensive RLE DVT management

## 2021-11-03 ENCOUNTER — Other Ambulatory Visit (HOSPITAL_BASED_OUTPATIENT_CLINIC_OR_DEPARTMENT_OTHER): Payer: Self-pay

## 2021-11-03 ENCOUNTER — Other Ambulatory Visit: Payer: Self-pay | Admitting: Hematology & Oncology

## 2021-11-03 DIAGNOSIS — Z85819 Personal history of malignant neoplasm of unspecified site of lip, oral cavity, and pharynx: Secondary | ICD-10-CM | POA: Diagnosis not present

## 2021-11-03 MED ORDER — ENOXAPARIN SODIUM 120 MG/0.8ML IJ SOSY
1.0000 mg/kg | PREFILLED_SYRINGE | Freq: Two times a day (BID) | INTRAMUSCULAR | 0 refills | Status: DC
  Filled 2021-11-03: qty 24, 15d supply, fill #0

## 2021-11-04 ENCOUNTER — Other Ambulatory Visit (HOSPITAL_BASED_OUTPATIENT_CLINIC_OR_DEPARTMENT_OTHER): Payer: Self-pay

## 2021-11-07 ENCOUNTER — Telehealth: Payer: Self-pay | Admitting: *Deleted

## 2021-11-07 NOTE — Telephone Encounter (Signed)
Patient wife Derek [name] called stating that Derek [name] cant go on receiving these injections because they hurt and are causing large red areas on his stomach.  Dr [name] notified.  Concerned that he failed previous therapies.  Asked patient if they can wait until 10/6 to talk to Dr [name] at appointment.  They asked to move up appt.  Scheduling request made.

## 2021-11-10 ENCOUNTER — Inpatient Hospital Stay (HOSPITAL_BASED_OUTPATIENT_CLINIC_OR_DEPARTMENT_OTHER): Payer: HMO | Admitting: Hematology & Oncology

## 2021-11-10 ENCOUNTER — Other Ambulatory Visit: Payer: Self-pay

## 2021-11-10 ENCOUNTER — Other Ambulatory Visit (HOSPITAL_BASED_OUTPATIENT_CLINIC_OR_DEPARTMENT_OTHER): Payer: Self-pay

## 2021-11-10 ENCOUNTER — Inpatient Hospital Stay: Payer: HMO | Attending: Hematology & Oncology

## 2021-11-10 ENCOUNTER — Encounter: Payer: Self-pay | Admitting: Hematology & Oncology

## 2021-11-10 VITALS — BP 123/71 | HR 75 | Temp 97.8°F | Resp 18 | Ht 72.0 in | Wt 266.0 lb

## 2021-11-10 DIAGNOSIS — M545 Low back pain, unspecified: Secondary | ICD-10-CM | POA: Insufficient documentation

## 2021-11-10 DIAGNOSIS — D6851 Activated protein C resistance: Secondary | ICD-10-CM | POA: Insufficient documentation

## 2021-11-10 DIAGNOSIS — E1100 Type 2 diabetes mellitus with hyperosmolarity without nonketotic hyperglycemic-hyperosmolar coma (NKHHC): Secondary | ICD-10-CM

## 2021-11-10 DIAGNOSIS — I82401 Acute embolism and thrombosis of unspecified deep veins of right lower extremity: Secondary | ICD-10-CM

## 2021-11-10 DIAGNOSIS — M25512 Pain in left shoulder: Secondary | ICD-10-CM | POA: Diagnosis not present

## 2021-11-10 DIAGNOSIS — D75839 Thrombocytosis, unspecified: Secondary | ICD-10-CM

## 2021-11-10 DIAGNOSIS — M25511 Pain in right shoulder: Secondary | ICD-10-CM | POA: Diagnosis not present

## 2021-11-10 LAB — CBC WITH DIFFERENTIAL (CANCER CENTER ONLY)
Abs Immature Granulocytes: 0.01 10*3/uL (ref 0.00–0.07)
Basophils Absolute: 0.1 10*3/uL (ref 0.0–0.1)
Basophils Relative: 2 %
Eosinophils Absolute: 0.5 10*3/uL (ref 0.0–0.5)
Eosinophils Relative: 7 %
HCT: 42 % (ref 39.0–52.0)
Hemoglobin: 13.3 g/dL (ref 13.0–17.0)
Immature Granulocytes: 0 %
Lymphocytes Relative: 39 %
Lymphs Abs: 2.7 10*3/uL (ref 0.7–4.0)
MCH: 28 pg (ref 26.0–34.0)
MCHC: 31.7 g/dL (ref 30.0–36.0)
MCV: 88.4 fL (ref 80.0–100.0)
Monocytes Absolute: 1 10*3/uL (ref 0.1–1.0)
Monocytes Relative: 14 %
Neutro Abs: 2.6 10*3/uL (ref 1.7–7.7)
Neutrophils Relative %: 38 %
Platelet Count: 550 10*3/uL — ABNORMAL HIGH (ref 150–400)
RBC: 4.75 MIL/uL (ref 4.22–5.81)
RDW: 13.6 % (ref 11.5–15.5)
WBC Count: 6.9 10*3/uL (ref 4.0–10.5)
nRBC: 0.3 % — ABNORMAL HIGH (ref 0.0–0.2)

## 2021-11-10 LAB — CMP (CANCER CENTER ONLY)
ALT: 20 U/L (ref 0–44)
AST: 19 U/L (ref 15–41)
Albumin: 4.4 g/dL (ref 3.5–5.0)
Alkaline Phosphatase: 88 U/L (ref 38–126)
Anion gap: 6 (ref 5–15)
BUN: 15 mg/dL (ref 8–23)
CO2: 26 mmol/L (ref 22–32)
Calcium: 11.3 mg/dL — ABNORMAL HIGH (ref 8.9–10.3)
Chloride: 104 mmol/L (ref 98–111)
Creatinine: 1.12 mg/dL (ref 0.61–1.24)
GFR, Estimated: >60 mL/min (ref >60)
Glucose, Bld: 189 mg/dL — ABNORMAL HIGH (ref 70–99)
Potassium: 5 mmol/L (ref 3.5–5.1)
Sodium: 136 mmol/L (ref 135–145)
Total Bilirubin: 0.4 mg/dL (ref 0.3–1.2)
Total Protein: 7.8 g/dL (ref 6.5–8.1)

## 2021-11-10 MED ORDER — DABIGATRAN ETEXILATE MESYLATE 150 MG PO CAPS
150.0000 mg | ORAL_CAPSULE | Freq: Two times a day (BID) | ORAL | 12 refills | Status: DC
  Filled 2021-11-10: qty 60, 30d supply, fill #0
  Filled 2021-12-14: qty 60, 30d supply, fill #1
  Filled 2022-01-09: qty 60, 30d supply, fill #2
  Filled 2022-02-08: qty 60, 30d supply, fill #3
  Filled 2022-03-13: qty 60, 30d supply, fill #4
  Filled 2022-04-17: qty 60, 30d supply, fill #5

## 2021-11-10 NOTE — Progress Notes (Signed)
Hematology and Oncology Follow Up Visit  Derek [name] [id number] February 07, 1946 76 y.o. 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  He is having a lot of lower back pain.  This is been a chronic problem for him.  He is going to have nerve ablation next week.  I think this will be on 11/15/2021.  I told him to stop the Lovenox after dose on Sunday morning.  He does not want to go back onto Lovenox.  He is tired of having injections.  I think we will probably switch him over to Pradaxa.  I think Pradaxa at 150 mg p.o. twice daily would be reasonable.  I told her to start this the day after he has his nerve ablation.  His right leg is still a little bit swollen but he says in the morning when he wakes up there is no swelling at all.  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  He will have some compression stockings made and hopefully they will help keep swelling down.  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  I am sure this is the reason for the thrombocytosis.  We may have to watch his iron studies.  He has had no fever.  He has had no obvious bleeding.  He does have some hematomas where he gets the Lovenox injections on his abdominal wall.  He has had no headache.  Overall, I would say his performance status is probably ECOG 1-2.  Medications:  Current Outpatient Medications:    acetaminophen (TYLENOL) 500 MG tablet, Take 500 mg by mouth every 6 (six) hours as needed for mild pain or headache., Disp: , Rfl:    aspirin EC 81 MG tablet, Take 81 mg by mouth daily. Swallow whole., Disp: , Rfl:    carvedilol (COREG) 25 MG tablet, Take 1 tablet by mouth once a day every evening, Disp: 90  tablet, Rfl: 4   diphenhydramine-acetaminophen (TYLENOL PM) 25-500 MG TABS tablet, Take 2 tablets by mouth at bedtime., Disp: , Rfl:    Dulaglutide (TRULICITY) 3 IE/3.3IR SOPN, Inject 0.5 mLs (3 mg total) into the skin every 7 days. (Patient taking differently: Inject 3 mg into the skin every Monday.), Disp: 6 mL, Rfl: 1   empagliflozin (JARDIANCE) 25 MG TABS tablet, Take 1 tablet (25 mg total) by mouth daily., Disp: 90 tablet, Rfl: 1   enoxaparin (LOVENOX) 120 MG/0.8ML injection, Inject 0.8 mLs (120 mg total) into the skin 2 (two) times daily for 15 days., Disp: 24 mL, Rfl: 0   furosemide (LASIX) 20 MG tablet, Take 20 mg by mouth daily as needed for edema., Disp: , Rfl:    lansoprazole (PREVACID) 15 MG capsule, Take 15 mg by mouth daily as needed (for breakthrough GERD)., Disp: , Rfl:    lansoprazole (PREVACID) 30 MG capsule, TAKE 1 CAPSULE BY MOUTH ONCE DAILY (Patient taking differently: Take 30 mg by mouth at bedtime.), Disp: 90 capsule, Rfl: 4   lidocaine (LIDODERM) 5 %, Place 1 patch onto the skin daily. Remove & Discard patch within 12 hours or as directed by MD, Disp: 30 patch, Rfl: 0   losartan (COZAAR)  50 MG tablet, Take 1 tablet by mouth once every evening, Disp: 90 tablet, Rfl: 4   magnesium oxide (MAG-OX) 400 MG tablet, Take 800 mg by mouth at bedtime., Disp: , Rfl:    nystatin (MYCOSTATIN) 100000 UNIT/ML suspension, Use 4-5 mL for Mouth/Throat as directed four times a day (Patient not taking: Reported on 10/07/2021), Disp: 240 mL, Rfl: 3   ondansetron (ZOFRAN-ODT) 4 MG disintegrating tablet, Take 1 tablet (4 mg total) by mouth every 8 (eight) hours as needed for nausea or vomiting. (Patient not taking: Reported on 10/14/2021), Disp: 30 tablet, Rfl: 0   OVER THE COUNTER MEDICATION, Apply 1 application  topically 2 (two) times daily as needed (pain). [place] Dream cream, Disp: , Rfl:    oxybutynin (DITROPAN) 5 MG tablet, Take 1 tablet (5 mg total) by mouth every 6 (six) to 8 (eight) hours  as needed for urinary urgency/frequency/leakage. (Patient taking differently: Take 5 mg by mouth at bedtime.), Disp: 90 tablet, Rfl: 11   pravastatin (PRAVACHOL) 20 MG tablet, Take 1 tablet by mouth twice a week (Patient taking differently: Take 20 mg by mouth 2 (two) times a week. Monday, Friday), Disp: 40 tablet, Rfl: 4   tamsulosin (FLOMAX) 0.4 MG CAPS capsule, take 1 capsule by mouth at bedtime, Disp: 30 capsule, Rfl: 3   triamcinolone cream (KENALOG) 0.1 %, Apply externally two times a day for 30 days. (Patient taking differently: Apply 1 application  topically daily as needed (irritation).), Disp: 80 g, Rfl: 3  Allergies:  Allergies  Allergen Reactions   Ditropan [Oxybutynin] Swelling and Other (See Comments)   Quinine Other (See Comments)    Platelets dropped- consumptive coagulopathy     Vibra-Tab [Doxycycline] Other (See Comments)    Epistaxis     Voltaren [Diclofenac] Swelling and Other (See Comments)    Elevated liver enzymes Lip swelling    Zoloft [Sertraline] Other (See Comments)    Insomnia Acute urinary retention   Cymbalta [Duloxetine Hcl] Other (See Comments)    Night sweats   Morphine And Related Nausea And Vomiting, Swelling and Other (See Comments)    Facial and lip swelling OK to use IR form of hydrocodone and oxycodone   Oxycontin [Oxycodone Hcl] Swelling and Other (See Comments)    Lip swelling OK to use IR form   Tape Other (See Comments)    Skin tears easily   Voltaren [Diclofenac Sodium] Other (See Comments)    Elevated liver enzymes   Zohydro Er [Hydrocodone Bitartrate Er] Swelling    Facial swelling OK to use IR form   Fentanyl Nausea And Vomiting and Other (See Comments)    Nausea, vomiting ad headaches   Neurontin [Gabapentin] Other (See Comments)    Unknown reaction     Past Medical History, Surgical history, Social history, and Family History were reviewed and updated.  Review of Systems: Review of Systems  Constitutional:  Positive for  fatigue.  HENT:  Negative.    Eyes: Negative.   Respiratory: Negative.    Cardiovascular:  Positive for leg swelling.  Gastrointestinal: Negative.   Endocrine: Negative.   Genitourinary:  Positive for hematuria.   Musculoskeletal:  Positive for back pain, myalgias and neck pain.  Neurological:  Positive for dizziness and headaches.  Hematological: Negative.   Psychiatric/Behavioral: Negative.      Physical Exam:  height is 6' (1.829 m) and weight is 266 lb (120.7 kg). His oral temperature is 97.8 F (36.6 C). His blood pressure is 123/71 and his pulse is  75. His respiration is 18 and oxygen saturation is 100%.   Wt Readings from Last 3 Encounters:  11/10/21 266 lb (120.7 kg)  10/14/21 265 lb (120.2 kg)  10/07/21 264 lb 8.8 oz (120 kg)    Physical Exam Vitals reviewed.  HENT:     Head: Normocephalic and atraumatic.  Eyes:     Pupils: Pupils are equal, round, and reactive to light.  Cardiovascular:     Rate and Rhythm: Normal rate and regular rhythm.     Heart sounds: Normal heart sounds.  Pulmonary:     Effort: Pulmonary effort is normal.     Breath sounds: Normal breath sounds.  Abdominal:     General: Bowel sounds are normal.     Palpations: Abdomen is soft.  Musculoskeletal:        General: No tenderness or deformity. Normal range of motion.     Cervical back: Normal range of motion.     Comments: His extremities does show some mild swelling in the right leg.  It is not as firm.  He has good warmth to the leg.  He has palpable pulses in the distal extremity.    Lymphadenopathy:     Cervical: No cervical adenopathy.  Skin:    General: Skin is warm and dry.     Findings: No erythema or rash.  Neurological:     Mental Status: He is alert and oriented to person, place, and time.  Psychiatric:        Behavior: Behavior normal.        Thought Content: Thought content normal.        Judgment: Judgment normal.      Lab Results  Component Value Date   WBC 6.9  11/10/2021   HGB 13.3 11/10/2021   HCT 42.0 11/10/2021   MCV 88.4 11/10/2021   PLT 550 (H) 11/10/2021     Chemistry      Component Value Date/Time   NA 134 (L) 10/14/2021 1454   K 5.0 10/14/2021 1454   CL 102 10/14/2021 1454   CO2 25 10/14/2021 1454   BUN 18 10/14/2021 1454   CREATININE 1.12 10/14/2021 1454      Component Value Date/Time   CALCIUM 10.7 (H) 10/14/2021 1454   ALKPHOS 97 10/14/2021 1454   AST 22 10/14/2021 1454   ALT 24 10/14/2021 1454   BILITOT 0.4 10/14/2021 1454       Impression and Plan: Mr. Kaluzny is a very nice 76 year old white male.  He has had incredible history.  He has had a past history of a neuroendocrine pancreatic tumor.  He apparently was hospitalized for 4 months because of complications.  I am glad to see that the legs are doing better.  I think still has the IVC filter in.  I am unsure if this can be removed.  However, Interventional radiology will take a look at him I think in 3 months.  We will switch him over to Pradaxa.  This is a direct thrombin inhibitor.  Hopefully, he will not have problems with the Pradaxa.  I do not see any problems with him having the spinal nerve ablation.  Again he is going stop the Lovenox Sunday morning.  I think this would be enough time for Lovenox to get out of system in the ablation to be done.  We will go ahead and plan to get him back to see him in about 6 weeks.  Hopefully, he will be having less pain.  Again,  hopefully, he will be much more enthusiastic with the Pradaxa and I have to worry about twice daily injections.  [name], MD 9/21/202312:09 PM

## 2021-11-11 ENCOUNTER — Other Ambulatory Visit (HOSPITAL_BASED_OUTPATIENT_CLINIC_OR_DEPARTMENT_OTHER): Payer: Self-pay

## 2021-11-14 ENCOUNTER — Other Ambulatory Visit (HOSPITAL_BASED_OUTPATIENT_CLINIC_OR_DEPARTMENT_OTHER): Payer: Self-pay

## 2021-11-14 DIAGNOSIS — Z7984 Long term (current) use of oral hypoglycemic drugs: Secondary | ICD-10-CM | POA: Diagnosis not present

## 2021-11-14 DIAGNOSIS — Z794 Long term (current) use of insulin: Secondary | ICD-10-CM | POA: Diagnosis not present

## 2021-11-14 DIAGNOSIS — I10 Essential (primary) hypertension: Secondary | ICD-10-CM | POA: Diagnosis not present

## 2021-11-14 DIAGNOSIS — E113393 Type 2 diabetes mellitus with moderate nonproliferative diabetic retinopathy without macular edema, bilateral: Secondary | ICD-10-CM | POA: Diagnosis not present

## 2021-11-14 DIAGNOSIS — E782 Mixed hyperlipidemia: Secondary | ICD-10-CM | POA: Diagnosis not present

## 2021-11-14 DIAGNOSIS — Z7985 Long-term (current) use of injectable non-insulin antidiabetic drugs: Secondary | ICD-10-CM | POA: Diagnosis not present

## 2021-11-14 MED ORDER — JARDIANCE 25 MG PO TABS
25.0000 mg | ORAL_TABLET | Freq: Every day | ORAL | 1 refills | Status: DC
  Filled 2021-11-14: qty 90, 90d supply, fill #0
  Filled 2022-02-08: qty 90, 90d supply, fill #1

## 2021-11-14 MED ORDER — INSULIN DEGLUDEC FLEXTOUCH 100 UNIT/ML ~~LOC~~ SOPN
70.0000 [IU] | PEN_INJECTOR | Freq: Every day | SUBCUTANEOUS | 1 refills | Status: DC
  Filled 2021-11-14: qty 60, 86d supply, fill #0
  Filled 2022-02-08 – 2022-02-10 (×2): qty 60, 86d supply, fill #1

## 2021-11-14 MED ORDER — TRULICITY 4.5 MG/0.5ML ~~LOC~~ SOAJ
4.5000 mg | SUBCUTANEOUS | 1 refills | Status: DC
  Filled 2021-11-14: qty 6, 84d supply, fill #0

## 2021-11-15 ENCOUNTER — Other Ambulatory Visit (HOSPITAL_BASED_OUTPATIENT_CLINIC_OR_DEPARTMENT_OTHER): Payer: Self-pay

## 2021-11-15 DIAGNOSIS — M47816 Spondylosis without myelopathy or radiculopathy, lumbar region: Secondary | ICD-10-CM | POA: Diagnosis not present

## 2021-11-16 ENCOUNTER — Other Ambulatory Visit (HOSPITAL_BASED_OUTPATIENT_CLINIC_OR_DEPARTMENT_OTHER): Payer: Self-pay

## 2021-11-16 MED ORDER — SHINGRIX 50 MCG/0.5ML IM SUSR
INTRAMUSCULAR | 0 refills | Status: DC
  Filled 2021-11-16: qty 1, 1d supply, fill #0

## 2021-11-23 ENCOUNTER — Other Ambulatory Visit (HOSPITAL_BASED_OUTPATIENT_CLINIC_OR_DEPARTMENT_OTHER): Payer: Self-pay

## 2021-11-25 ENCOUNTER — Other Ambulatory Visit: Payer: HMO

## 2021-11-25 ENCOUNTER — Ambulatory Visit: Payer: HMO | Admitting: Hematology & Oncology

## 2021-12-14 ENCOUNTER — Other Ambulatory Visit (HOSPITAL_BASED_OUTPATIENT_CLINIC_OR_DEPARTMENT_OTHER): Payer: Self-pay

## 2021-12-16 ENCOUNTER — Encounter: Payer: Self-pay | Admitting: *Deleted

## 2021-12-19 ENCOUNTER — Other Ambulatory Visit: Payer: Self-pay | Admitting: Physician Assistant

## 2021-12-19 ENCOUNTER — Other Ambulatory Visit (HOSPITAL_BASED_OUTPATIENT_CLINIC_OR_DEPARTMENT_OTHER): Payer: Self-pay

## 2021-12-19 DIAGNOSIS — R519 Headache, unspecified: Secondary | ICD-10-CM

## 2021-12-20 ENCOUNTER — Other Ambulatory Visit (HOSPITAL_BASED_OUTPATIENT_CLINIC_OR_DEPARTMENT_OTHER): Payer: Self-pay

## 2021-12-20 MED ORDER — FLUAD QUADRIVALENT 0.5 ML IM PRSY
PREFILLED_SYRINGE | INTRAMUSCULAR | 0 refills | Status: DC
  Filled 2021-12-20: qty 0.5, 1d supply, fill #0

## 2021-12-22 ENCOUNTER — Ambulatory Visit
Admission: RE | Admit: 2021-12-22 | Discharge: 2021-12-22 | Disposition: A | Discharge status: Home / Self Care | Payer: HMO | Source: Ambulatory Visit | Attending: Physician Assistant | Admitting: Physician Assistant

## 2021-12-22 DIAGNOSIS — R519 Headache, unspecified: Secondary | ICD-10-CM

## 2021-12-26 ENCOUNTER — Other Ambulatory Visit (HOSPITAL_BASED_OUTPATIENT_CLINIC_OR_DEPARTMENT_OTHER): Payer: Self-pay

## 2021-12-29 ENCOUNTER — Inpatient Hospital Stay (HOSPITAL_BASED_OUTPATIENT_CLINIC_OR_DEPARTMENT_OTHER): Payer: HMO | Admitting: Hematology & Oncology

## 2021-12-29 ENCOUNTER — Inpatient Hospital Stay: Payer: HMO | Attending: Hematology & Oncology

## 2021-12-29 ENCOUNTER — Encounter: Payer: Self-pay | Admitting: Hematology & Oncology

## 2021-12-29 VITALS — BP 104/59 | HR 75 | Temp 98.0°F | Resp 20 | Ht 71.0 in | Wt 266.0 lb

## 2021-12-29 DIAGNOSIS — I82401 Acute embolism and thrombosis of unspecified deep veins of right lower extremity: Secondary | ICD-10-CM

## 2021-12-29 DIAGNOSIS — D75839 Thrombocytosis, unspecified: Secondary | ICD-10-CM | POA: Diagnosis not present

## 2021-12-29 DIAGNOSIS — Z7901 Long term (current) use of anticoagulants: Secondary | ICD-10-CM | POA: Insufficient documentation

## 2021-12-29 DIAGNOSIS — E1100 Type 2 diabetes mellitus with hyperosmolarity without nonketotic hyperglycemic-hyperosmolar coma (NKHHC): Secondary | ICD-10-CM

## 2021-12-29 DIAGNOSIS — D6851 Activated protein C resistance: Secondary | ICD-10-CM | POA: Diagnosis not present

## 2021-12-29 LAB — CBC WITH DIFFERENTIAL (CANCER CENTER ONLY)
Abs Immature Granulocytes: 0.03 10*3/uL (ref 0.00–0.07)
Basophils Absolute: 0.1 10*3/uL (ref 0.0–0.1)
Basophils Relative: 1 %
Eosinophils Absolute: 0.2 10*3/uL (ref 0.0–0.5)
Eosinophils Relative: 3 %
HCT: 41.1 % (ref 39.0–52.0)
Hemoglobin: 12.9 g/dL — ABNORMAL LOW (ref 13.0–17.0)
Immature Granulocytes: 0 %
Lymphocytes Relative: 30 %
Lymphs Abs: 2.7 10*3/uL (ref 0.7–4.0)
MCH: 26.1 pg (ref 26.0–34.0)
MCHC: 31.4 g/dL (ref 30.0–36.0)
MCV: 83 fL (ref 80.0–100.0)
Monocytes Absolute: 1.1 10*3/uL — ABNORMAL HIGH (ref 0.1–1.0)
Monocytes Relative: 12 %
Neutro Abs: 4.9 10*3/uL (ref 1.7–7.7)
Neutrophils Relative %: 54 %
Platelet Count: 492 10*3/uL — ABNORMAL HIGH (ref 150–400)
RBC: 4.95 MIL/uL (ref 4.22–5.81)
RDW: 15.3 % (ref 11.5–15.5)
WBC Count: 9.1 10*3/uL (ref 4.0–10.5)
nRBC: 0.3 % — ABNORMAL HIGH (ref 0.0–0.2)

## 2021-12-29 LAB — CMP (CANCER CENTER ONLY)
ALT: 16 U/L (ref 0–44)
AST: 14 U/L — ABNORMAL LOW (ref 15–41)
Albumin: 4.1 g/dL (ref 3.5–5.0)
Alkaline Phosphatase: 75 U/L (ref 38–126)
Anion gap: 7 (ref 5–15)
BUN: 23 mg/dL (ref 8–23)
CO2: 26 mmol/L (ref 22–32)
Calcium: 10.8 mg/dL — ABNORMAL HIGH (ref 8.9–10.3)
Chloride: 105 mmol/L (ref 98–111)
Creatinine: 1.19 mg/dL (ref 0.61–1.24)
GFR, Estimated: >60 mL/min (ref >60)
Glucose, Bld: 160 mg/dL — ABNORMAL HIGH (ref 70–99)
Potassium: 4.7 mmol/L (ref 3.5–5.1)
Sodium: 138 mmol/L (ref 135–145)
Total Bilirubin: 0.3 mg/dL (ref 0.3–1.2)
Total Protein: 7.2 g/dL (ref 6.5–8.1)

## 2021-12-29 LAB — RETICULOCYTES
Immature Retic Fract: 24.6 % — ABNORMAL HIGH (ref 2.3–15.9)
RBC.: 4.95 MIL/uL (ref 4.22–5.81)
Retic Count, Absolute: 78.7 10*3/uL (ref 19.0–186.0)
Retic Ct Pct: 1.6 % (ref 0.4–3.1)

## 2021-12-29 LAB — FERRITIN: Ferritin: 7 ng/mL — ABNORMAL LOW (ref 24–336)

## 2021-12-29 LAB — SAVE SMEAR(SSMR), FOR PROVIDER SLIDE REVIEW

## 2021-12-29 NOTE — Progress Notes (Signed)
Hematology and Oncology Follow Up Visit  Derek [name] [id number] 11/09/1945 76 y.o. 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  Thankfully his right leg is doing well.  Still a little bit of swelling but after he had the thrombectomy, he really has improved.  His main problem continues to be his lower back.  I think he had some spinal ablation done.  It worked for part of his spine.  I am unsure what is next for him.  He may be looking at a TENS UNIT.  He is on Pradaxa.  He has had no problems with bleeding.  He has had no nausea or vomiting.  There is been no change in bowel or bladder habits.  He has had no cough or shortness of breath.  He has had no chest wall pain.  Again, it is his lower back that really is his clinical issue.  This will dictate his quality of life in my opinion.  Currently, I would have to say that his performance status is ECOG 2.   Medications:  Current Outpatient Medications:    acetaminophen (TYLENOL) 500 MG tablet, Take 500 mg by mouth every 6 (six) hours as needed for mild pain or headache., Disp: , Rfl:    aspirin EC 81 MG tablet, Take 81 mg by mouth daily. Swallow whole., Disp: , Rfl:    carvedilol (COREG) 25 MG tablet, Take 1 tablet by mouth once a day every evening, Disp: 90 tablet, Rfl: 4   dabigatran (PRADAXA) 150 MG CAPS capsule, Take 1 capsule (150 mg total) by mouth 2 (two) times daily., Disp: 60 capsule, Rfl: 12   diphenhydramine-acetaminophen (TYLENOL PM) 25-500 MG TABS tablet, Take 2 tablets by mouth at bedtime., Disp: , Rfl:    Dulaglutide (TRULICITY) 3 HQ/4.6NG SOPN, Inject 0.5 mLs (3 mg total) into the skin every 7 days. (Patient taking differently: Inject 3 mg into the skin every  Monday.), Disp: 6 mL, Rfl: 1   empagliflozin (JARDIANCE) 25 MG TABS tablet, Take 1 tablet (25 mg total) by mouth daily., Disp: 90 tablet, Rfl: 1   furosemide (LASIX) 20 MG tablet, Take 20 mg by mouth daily as needed for edema., Disp: , Rfl:    Insulin Degludec FlexTouch 100 UNIT/ML SOPN, Inject 70 Units into the skin daily., Disp: 60 mL, Rfl: 1   lansoprazole (PREVACID) 15 MG capsule, Take 15 mg by mouth daily as needed (for breakthrough GERD)., Disp: , Rfl:    lansoprazole (PREVACID) 30 MG capsule, TAKE 1 CAPSULE BY MOUTH ONCE DAILY (Patient taking differently: Take 30 mg by mouth at bedtime.), Disp: 90 capsule, Rfl: 4   losartan (COZAAR) 50 MG tablet, Take 1 tablet by mouth once every evening, Disp: 90 tablet, Rfl: 4   magnesium oxide (MAG-OX) 400 MG tablet, Take 800 mg by mouth at bedtime., Disp: , Rfl:    oxybutynin (DITROPAN) 5 MG tablet, Take 1 tablet (5 mg total) by mouth every 6 (six) to 8 (eight) hours as needed for urinary urgency/frequency/leakage. (Patient taking differently: Take 5 mg by mouth at bedtime.), Disp: 90 tablet, Rfl: 11   pravastatin (PRAVACHOL) 20 MG tablet, Take 1 tablet by mouth twice a week (Patient taking differently: Take  20 mg by mouth 2 (two) times a week. Monday, Friday), Disp: 40 tablet, Rfl: 4   tamsulosin (FLOMAX) 0.4 MG CAPS capsule, take 1 capsule by mouth at bedtime, Disp: 30 capsule, Rfl: 3   triamcinolone cream (KENALOG) 0.1 %, Apply externally two times a day for 30 days. (Patient taking differently: Apply 1 application  topically daily as needed (irritation).), Disp: 80 g, Rfl: 3   Dulaglutide (TRULICITY) 4.5 AV/4.0JW SOPN, Inject 4.5 mg into the skin every 7 (seven) days. (Patient not taking: Reported on 12/29/2021), Disp: 6 mL, Rfl: 1   nystatin (MYCOSTATIN) 100000 UNIT/ML suspension, Use 4-5 mL for Mouth/Throat as directed four times a day (Patient not taking: Reported on 10/07/2021), Disp: 240 mL, Rfl: 3   ondansetron (ZOFRAN-ODT) 4 MG disintegrating  tablet, Take 1 tablet (4 mg total) by mouth every 8 (eight) hours as needed for nausea or vomiting. (Patient not taking: Reported on 10/14/2021), Disp: 30 tablet, Rfl: 0  Allergies:  Allergies  Allergen Reactions   Ditropan [Oxybutynin] Swelling and Other (See Comments)   Quinine Other (See Comments)    Platelets dropped- consumptive coagulopathy     Vibra-Tab [Doxycycline] Other (See Comments)    Epistaxis     Voltaren [Diclofenac] Swelling and Other (See Comments)    Elevated liver enzymes Lip swelling    Zoloft [Sertraline] Other (See Comments)    Insomnia Acute urinary retention   Cymbalta [Duloxetine Hcl] Other (See Comments)    Night sweats   Morphine And Related Nausea And Vomiting, Swelling and Other (See Comments)    Facial and lip swelling OK to use IR form of hydrocodone and oxycodone   Oxycontin [Oxycodone Hcl] Swelling and Other (See Comments)    Lip swelling OK to use IR form   Tape Other (See Comments)    Skin tears easily   Voltaren [Diclofenac Sodium] Other (See Comments)    Elevated liver enzymes   Zohydro Er [Hydrocodone Bitartrate Er] Swelling    Facial swelling OK to use IR form   Fentanyl Nausea And Vomiting and Other (See Comments)    Nausea, vomiting ad headaches   Neurontin [Gabapentin] Other (See Comments)    Unknown reaction     Past Medical History, Surgical history, Social history, and Family History were reviewed and updated.  Review of Systems: Review of Systems  Constitutional:  Positive for fatigue.  HENT:  Negative.    Eyes: Negative.   Respiratory: Negative.    Cardiovascular:  Positive for leg swelling.  Gastrointestinal: Negative.   Endocrine: Negative.   Genitourinary:  Positive for hematuria.   Musculoskeletal:  Positive for back pain, myalgias and neck pain.  Neurological:  Positive for dizziness and headaches.  Hematological: Negative.   Psychiatric/Behavioral: Negative.      Physical Exam:  height is '5\' 11"'$  (1.803  m) and weight is 266 lb (120.7 kg). His oral temperature is 98 F (36.7 C). His blood pressure is 104/59 (abnormal) and his pulse is 75. His respiration is 20 and oxygen saturation is 95%.   Wt Readings from Last 3 Encounters:  12/29/21 266 lb (120.7 kg)  11/10/21 266 lb (120.7 kg)  10/14/21 265 lb (120.2 kg)    Physical Exam Vitals reviewed.  HENT:     Head: Normocephalic and atraumatic.  Eyes:     Pupils: Pupils are equal, round, and reactive to light.  Cardiovascular:     Rate and Rhythm: Normal rate and regular rhythm.     Heart sounds: Normal heart sounds.  Pulmonary:     Effort: Pulmonary effort is normal.     Breath sounds: Normal breath sounds.  Abdominal:     General: Bowel sounds are normal.     Palpations: Abdomen is soft.  Musculoskeletal:        General: No tenderness or deformity. Normal range of motion.     Cervical back: Normal range of motion.     Comments: His extremities does show some mild swelling in the right leg.  It is not as firm.  He has good warmth to the leg.  He has palpable pulses in the distal extremity.    Lymphadenopathy:     Cervical: No cervical adenopathy.  Skin:    General: Skin is warm and dry.     Findings: No erythema or rash.  Neurological:     Mental Status: He is alert and oriented to person, place, and time.  Psychiatric:        Behavior: Behavior normal.        Thought Content: Thought content normal.        Judgment: Judgment normal.      Lab Results  Component Value Date   WBC 9.1 12/29/2021   HGB 12.9 (L) 12/29/2021   HCT 41.1 12/29/2021   MCV 83.0 12/29/2021   PLT 492 (H) 12/29/2021     Chemistry      Component Value Date/Time   NA 136 11/10/2021 1138   K 5.0 11/10/2021 1138   CL 104 11/10/2021 1138   CO2 26 11/10/2021 1138   BUN 15 11/10/2021 1138   CREATININE 1.12 11/10/2021 1138      Component Value Date/Time   CALCIUM 11.3 (H) 11/10/2021 1138   ALKPHOS 88 11/10/2021 1138   AST 19 11/10/2021 1138    ALT 20 11/10/2021 1138   BILITOT 0.4 11/10/2021 1138       Impression and Plan: Derek [name] is a very nice 76 year old white male.  He has had incredible history.  He has had a past history of a neuroendocrine pancreatic tumor.  He apparently was hospitalized for 4 months because of complications.  He now has had recurrent thromboembolic disease.  He required a thrombectomy back in September.  Again this worked incredibly well for him.  He is on Pradaxa.  He will be on Pradaxa for life.  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  He has the thrombocytosis.  He has not had a splenectomy.  How much he worried about the thrombocytosis.  I do believe that he does have a IVC filter in place.  I will plan to see him back in 3-4 months now.  I do not see that we have to do any scans or Dopplers on him.    [name], MD 11/9/20233:27 PM

## 2021-12-30 ENCOUNTER — Other Ambulatory Visit: Payer: Self-pay | Admitting: Interventional Radiology

## 2021-12-30 DIAGNOSIS — I82401 Acute embolism and thrombosis of unspecified deep veins of right lower extremity: Secondary | ICD-10-CM

## 2021-12-30 LAB — IRON AND IRON BINDING CAPACITY (CC-WL,HP ONLY)
Iron: 26 ug/dL — ABNORMAL LOW (ref 45–182)
Saturation Ratios: 6 % — ABNORMAL LOW (ref 17.9–39.5)
TIBC: 441 ug/dL (ref 250–450)
UIBC: 415 ug/dL — ABNORMAL HIGH (ref 117–376)

## 2022-01-03 ENCOUNTER — Other Ambulatory Visit (HOSPITAL_BASED_OUTPATIENT_CLINIC_OR_DEPARTMENT_OTHER): Payer: Self-pay

## 2022-01-03 ENCOUNTER — Telehealth: Payer: Self-pay | Admitting: Neurology

## 2022-01-03 ENCOUNTER — Encounter: Payer: Self-pay | Admitting: Neurology

## 2022-01-03 ENCOUNTER — Ambulatory Visit: Payer: PPO | Admitting: Neurology

## 2022-01-03 VITALS — BP 135/79 | HR 79 | Ht 71.0 in | Wt 268.2 lb

## 2022-01-03 DIAGNOSIS — R6884 Jaw pain: Secondary | ICD-10-CM | POA: Diagnosis not present

## 2022-01-03 DIAGNOSIS — J320 Chronic maxillary sinusitis: Secondary | ICD-10-CM | POA: Diagnosis not present

## 2022-01-03 DIAGNOSIS — R519 Headache, unspecified: Secondary | ICD-10-CM

## 2022-01-03 DIAGNOSIS — G4734 Idiopathic sleep related nonobstructive alveolar hypoventilation: Secondary | ICD-10-CM

## 2022-01-03 MED ORDER — AMOXICILLIN-POT CLAVULANATE 875-125 MG PO TABS
1.0000 | ORAL_TABLET | Freq: Two times a day (BID) | ORAL | 0 refills | Status: DC
  Filled 2022-01-03: qty 20, 10d supply, fill #0

## 2022-01-03 NOTE — Progress Notes (Signed)
[facility]    Provider:  Dr Jaynee Eagles Requesting Provider: Amelia Jo, [place] Primary Care Provider:  [name], MD  CC:  maxillary pain and new onset headache  HPI:  Derek [name] is a 76 y.o. male here as requested by Amelia Jo, [place] for pounding occipital headaches.  He has a history of Mnire's disease and has disequilibrium.  He also has a past medical history of hypertension, GERD, sinusitis, chronic pain, type 2 diabetes, dysphagia, eczema, panic attack, on insulin, UTI, ulnar neuropathy, osteoarthritis, numbness and tingling in the hands, lumbar radiculopathy, obesity, factor V Leiden, depression, obesity, sleep apnea, kidney stones, he sees Dr. Stark [facility] he is also seen [facility] in [place], blood clots in legs recurrent even on Eliquis 2023 Dr. [name].  I reviewed Dr. Alan Ripper notes, he has intermittent pounding headaches have been occurring for the last 3 to 4 months, Tylenol does not help much, increased blurred vision over the last few years, suspect this is due to previously uncontrolled diabetes, no current sudden vision changes, random intermittent spells of dizziness but he has been years.  He has been seen by an eye doctor, disequilibrium, visual disturbance, vertigo he has concern for an intracranial mass or form of cancer that may be contributing to both his headaches and a sense of disequilibrium, he denies any history of sleep apnea or snoring however his headache is noticeably right when he wakes up in the morning and is not associated with positional changes in the morning.  Never really had headaches 3 months ago, no inciting events, no falls, he wake up every morning with the headache, right in the back of the head, no tenderness, pulsating, not shooting up from your neck, sleeps on the left or right, used to wake himself up years ago. More on the left. He refuses a sleep test or eben a test. The other option would  be occipital nerve. Blurry vision. Having neck pain. Finger numbness. Worst when he open his eyes sometimes it'll wake him up. Pounding, no light or sound sensitivity, no nausea, he pops his neck a lot, chronic neck pain for years. Unsteadiness and prior falls. Vision loss both eyes. Hearing changes tinnitis right ear, meniere's in the right ear. Wake up the headache goes away not all day. Last 10-30/40 minutes and gone. No other associated weakness or numbness or tingling, he has significant arthritis in the low back.   Reviewed notes, labs and imaging from outside physicians, which showed:  CT head 12/22/2020:  RADIATION DOSE REDUCTION: This exam was performed according to the departmental dose-optimization program which includes automated exposure control, adjustment of the mA and/or kV according to patient size and/or use of iterative reconstruction technique.   COMPARISON:  07/25/2016   FINDINGS: Brain: No acute intracranial findings are seen in noncontrast CT brain. There are no signs of bleeding within the cranium. Cortical sulci are prominent. Ventricles are unremarkable.   Vascular: Scattered arterial calcifications are seen.   Skull: Unremarkable.   Sinuses/Orbits: Days almost complete opacification of left maxillary sinus which was not seen in the previous study. There is central flaring of mucosal thickening in right maxillary sinus.   Other: None.   IMPRESSION: No acute intracranial findings are seen in noncontrast CT brain.  BUN 23 creatinine 1.19 11/2021   Chronic left maxillary sinusitis.  Review of Systems: Patient complains of symptoms per HPI as well as the following symptoms headache. Pertinent negatives and positives per HPI. All others  negative.   Social History   Socioeconomic History   Marital status: Married    Spouse name: Ivin Booty   Number of children: 2   Years of education: Not on file   Highest education level: Not on file  Occupational History    Occupation: retired   Tobacco Use   Smoking status: Former    Packs/day: 4.00    Years: 17.00    Total pack years: 68.00    Types: Cigarettes    Quit date: 02/21/1975    Years since quitting: 46.8   Smokeless tobacco: Never  Vaping Use   Vaping Use: Never used  Substance and Sexual Activity   Alcohol use: No    Alcohol/week: 0.0 standard drinks of alcohol   Drug use: No   Sexual activity: Not Currently  Other Topics Concern   Not on file  Social History Narrative   Married, retired Sport and exercise psychologist   1 son 1 daughter   2 caffeine free (will switch off once supply is finished with caffiene).       Social Determinants of Health   Financial Resource Strain: Not on file  Food Insecurity: No Food Insecurity (12/22/2019)   Hunger Vital Sign    Worried About Running Out of Food in the Last Year: Never true    Ran Out of Food in the Last Year: Never true  Transportation Needs: No Transportation Needs (12/22/2019)   PRAPARE - Hydrologist (Medical): No    Lack of Transportation (Non-Medical): No  Physical Activity: Not on file  Stress: Not on file  Social Connections: Not on file  Intimate Partner Violence: Not on file    Family History  Problem Relation Age of Onset   Bone cancer Father        Jaw   Heart disease Paternal Uncle    Diabetes Maternal Grandmother    Factor V Leiden deficiency Daughter    Colon cancer Neg Hx    Esophageal cancer Neg Hx    Stomach cancer Neg Hx    Rectal cancer Neg Hx    Migraines Neg Hx     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" no pulmonary md, sees dr Juanda Crumble alan ross   Factor V  Leiden mutation ([place])    "never had blood clots"   GERD (gastroesophageal reflux disease)    Hiatal hernia    History of kidney stones    multiple kidney stones-not a problem at present   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, pT1 pNX) no further treatment   History of panic attacks    History 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    Patient Active Problem List   Diagnosis Date Noted   Maxillary sinusitis, chronic 01/03/2022   Recurrent acute deep vein thrombosis (DVT) of lower extremity ([place]) 10/06/2021   Chronic back pain 11/24/2020   Constipation due to opioid therapy 11/24/2020   Intentional opiate overdose ([place]) 11/18/2020   Overdose, intentional self-harm, initial encounter ([place]) 11/17/2020   Deep venous thrombosis ([place]) 11/16/2020   Enlarged prostate with urinary obstruction 04/21/2019   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   Foreign body of abdominal wall 09/04/2016   Ventral incisional hernia 11/12/2015   Incisional hernia 11/10/2015   Infected pancreatic pseudocyst 05/13/2015   Pleural effusion on left    Lactose intolerance in adult 04/04/2015    Protein-calorie malnutrition, moderate ([place]) 04/04/2015   Pleural effusion, left    Debility    Benign essential HTN    Leukocytosis    Thrombocytosis    Intra-abdominal abscess (HCC)    DM2 (diabetes mellitus, type 2) ([place])    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 02/24/2015   OA (osteoarthritis) of knee 06/02/2013   Anal fissure 02/04/2013   Meniere's disease    GERD (gastroesophageal reflux disease)    Kidney stone    Obstructive sleep apnea    Morbid obesity ([place])    Synovitis of knee 03/01/2012   Chronic cough 11/03/2011   COSTOCHONDRITIS, LEFT 10/08/2009   RIB PAIN, LEFT SIDED 10/08/2009    Past Surgical History:  Procedure Laterality Date   ANAL FISTULECTOMY N/A 04/01/2013   Procedure: EXAM UNDER ANESTHESIA WITH ANAL FISSURectomy, sphincterotomy and internal hemorrhoidectomy;  Surgeon: Harl Bowie, MD;  Location: [facility];  Service: General;  Laterality: N/A;   APPLICATION OF WOUND VAC N/A 05/16/2015   Procedure: APPLICATION OF WOUND VAC;  Surgeon: Armandina Gemma, MD;  Location: [facility];  Service: General;  Laterality: N/A;   APPLICATION OF WOUND VAC N/A 05/20/2015   Procedure: EXHANGE OF ABDOMINAL  WOUND VAC DRESSING;  Surgeon: Armandina Gemma, MD;  Location: [facility];  Service: General;  Laterality: N/A;   COLONOSCOPY     CYSTOSCOPY N/A 05/23/2021   Procedure: Consuela Mimes;  Surgeon: Franchot Gallo, MD;  [facility];  Service: Urology;  Laterality: N/A;   CYSTOSCOPY WITH INSERTION OF UROLIFT N/A 10/12/2016   Procedure: CYSTOSCOPY WITH INSERTION OF UROLIFT;  Surgeon: Franchot Gallo, MD;  Location: [facility];  Service: Urology;  Laterality: N/A;   EUS N/A 12/31/2014   Procedure: UPPER ENDOSCOPIC ULTRASOUND (EUS) LINEAR;  Surgeon: Milus Banister, MD;  [facility];  Service: Endoscopy;  Laterality: N/A;   HARDWARE REMOVAL Left 2013   left ankle    HEMORRHOIDECTOMY WITH HEMORRHOID BANDING     INCISION AND DRAINAGE ABSCESS N/A 05/14/2015   Procedure: DRAINAGE OF INFECTED PANCREATIC PSEUDOCYST;  Surgeon: Armandina Gemma, MD;  [facility];  Service: General;  Laterality: N/A;  INCISION AND DRAINAGE OF WOUND N/A 05/16/2015   Procedure: IRRIGATION AND DEBRIDEMENT WOUND;  Surgeon: Armandina Gemma, MD;  [facility]: [facility];  Service: General;  Laterality: N/A;   INCISIONAL HERNIA REPAIR N/A 11/12/2015   Procedure: REPAIR INCISIONAL HERNIA WITH MESH;  Surgeon: Armandina Gemma, MD;  Location: [facility];  Service: General;  Laterality: N/A;   INGUINAL HERNIA REPAIR Right 1974;  [place] N/A 11/12/2015   Procedure: INSERTION OF MESH;  Surgeon: Armandina Gemma, MD;  Location: [facility];  Service: General;  Laterality: N/A;   IR GENERIC HISTORICAL  04/20/2015   IR RADIOLOGIST EVAL & MGMT 04/20/2015 [facility] INTERV RAD   IR IVC FILTER PLMT / S&I /IMG GUID/MOD SED  11/25/2020   IR RADIOLOGIST EVAL & MGMT  10/26/2021   IR RADIOLOGY PERIPHERAL GUIDED IV START  11/25/2020   IR THROMBECT VENO MECH MOD SED  10/08/2021   IR US GUIDE VASC ACCESS RIGHT  11/25/2020   IR US GUIDE VASC ACCESS RIGHT  10/08/2021   IR VENO/EXT/UNI RIGHT  10/12/2021   KNEE ARTHROSCOPY Bilateral right 08-08-2000;  left 11-23-2000   meniscal repair and chondroplasty's   KNEE ARTHROSCOPY  03/01/2012   Procedure: ARTHROSCOPY KNEE;  Surgeon: Gearlean Alf, MD;  Location[facility];  Service: Orthopedics;  Laterality: Left;  WITH SYNOVECTOMY    LAPAROTOMY N/A 05/14/2015   Procedure: EXPLORATORY LAPAROTOMY;  Surgeon: Armandina Gemma, MD;  [facility];  Service: General;  Laterality: N/A;   MASS EXCISION N/A 12/24/2019   Procedure: EXCISION LIP CARCINOMA;  Surgeon: Leta Baptist, MD;  Location: [facility];  Service: ENT;  Laterality: N/A;   ORIF LEFT ANKLE FX  08/20/2009   distal tib-fib and malleolus   ROTATOR CUFF REPAIR Bilateral right 1994; left 1993, left 1993   TOTAL KNEE ARTHROPLASTY Right  06/02/2013   Procedure: RIGHT TOTAL KNEE ARTHROPLASTY;  Surgeon: Gearlean Alf, MD;  [facility];  Service: Orthopedics;  Laterality: Right;   TOTAL KNEE ARTHROPLASTY Left 01-31-2008   dr Wynelle Link   TOTAL KNEE REVISION Right 01/23/2018   Procedure: right knee polyethylene revision;  Surgeon: Gaynelle Arabian, MD;  [facility];  Service: Orthopedics;  Laterality: Right;  62mn   TRANSURETHRAL RESECTION OF BLADDER TUMOR N/A 05/23/2021   Procedure: TRANSURETHRAL BIOPSY OF BLADDER;  Surgeon: DFranchot Gallo MD;  [facility]  Service: Urology;  Laterality: N/A;   TRANSURETHRAL RESECTION OF PROSTATE N/A 04/21/2019   Procedure: TRANSURETHRAL RESECTION OF THE PROSTATE (TURP);  Surgeon: DFranchot Gallo MD;  [facility]  Service: Urology;  Laterality: N/A;   UMBILICAL HERNIA REPAIR  08-11-1999   dr bNinfa Linden  UPPER GASTROINTESTINAL ENDOSCOPY  sept 2016   WOUND EXPLORATION N/A 09/21/2016   Procedure: WOUND EXPLORATION AND DEBRIDEMENT ABDOMINAL WALL;  Surgeon: GArmandina Gemma MD;  [facility];  Service: General;  Laterality: N/A;    Current Outpatient Medications  Medication Sig Dispense Refill   acetaminophen (TYLENOL) 500 MG tablet Take 500 mg by mouth every 6 (six) hours as needed for mild pain or headache.     aspirin EC 81 MG tablet Take 81 mg by mouth daily. Swallow whole.     carvedilol (COREG) 25 MG tablet Take 1 tablet by mouth once a day every evening 90 tablet 4   dabigatran (PRADAXA) 150 MG CAPS capsule Take 1 capsule (150 mg total) by mouth 2 (two) times daily. 60 capsule 12   diphenhydramine-acetaminophen (TYLENOL PM)  25-500 MG TABS tablet Take 2 tablets by mouth at bedtime.     empagliflozin (JARDIANCE) 25 MG TABS tablet Take 1 tablet (25 mg total) by mouth daily. 90 tablet 1   furosemide (LASIX) 20 MG tablet Take 20 mg by mouth daily as needed for edema.     Insulin Degludec FlexTouch 100 UNIT/ML SOPN Inject 70 Units into the  skin daily. 60 mL 1   lansoprazole (PREVACID) 15 MG capsule Take 15 mg by mouth daily as needed (for breakthrough GERD).     losartan (COZAAR) 50 MG tablet Take 1 tablet by mouth once every evening 90 tablet 4   magnesium oxide (MAG-OX) 400 MG tablet Take 800 mg by mouth at bedtime.     nystatin (MYCOSTATIN) 100000 UNIT/ML suspension Use 4-5 mL for Mouth/Throat as directed four times a day 240 mL 3   ondansetron (ZOFRAN-ODT) 4 MG disintegrating tablet Take 1 tablet (4 mg total) by mouth every 8 (eight) hours as needed for nausea or vomiting. 30 tablet 0   tamsulosin (FLOMAX) 0.4 MG CAPS capsule take 1 capsule by mouth at bedtime 30 capsule 3   amoxicillin-clavulanate (AUGMENTIN) 875-125 MG tablet Take 1 tablet by mouth every 12 (twelve) hours with food for 10 days. 20 tablet 0   No current facility-administered medications for this visit.    Allergies as of 01/03/2022 - Review Complete 01/03/2022  Allergen Reaction Noted   Ditropan [oxybutynin] Swelling and Other (See Comments) 08/16/2020   Quinine Other (See Comments) 10/08/2009   Vibra-tab [doxycycline] Other (See Comments) 09/25/2017   Voltaren [diclofenac] Swelling and Other (See Comments) 09/24/2014   Zoloft [sertraline] Other (See Comments) 12/19/2019   Cymbalta [duloxetine hcl] Other (See Comments) 08/16/2020   Morphine and related Nausea And Vomiting, Swelling, and Other (See Comments) 03/01/2012   Oxycontin [oxycodone hcl] Swelling and Other (See Comments) 03/01/2012   Tape Other (See Comments) 08/18/2020   Voltaren [diclofenac sodium] Other (See Comments) 09/24/2014   Zohydro er [hydrocodone bitartrate er] Swelling 09/29/2016   Fentanyl Nausea And Vomiting and Other (See Comments) 11/10/2021   Neurontin [gabapentin] Other (See Comments) 08/16/2020    Vitals: BP 135/79   Pulse 79   Ht '5\' 11"'$  (1.803 m)   Wt 268 lb 3.2 oz (121.7 kg)   BMI 37.41 kg/m  Last Weight:  Wt Readings from Last 1 Encounters:  01/03/22 268 lb 3.2  oz (121.7 kg)   Last Height:   Ht Readings from Last 1 Encounters:  01/03/22 '5\' 11"'$  (1.803 m)     Physical exam: Exam: Gen: NAD, conversant, well nourised, obese, well groomed                     CV: RRR, no MRG. No Carotid Bruits. No peripheral edema, warm, nontender Eyes: Conjunctivae clear without exudates or hemorrhage Pain on palpation of left maxillary sinus and swelling  Neuro: Detailed Neurologic Exam  Speech:    Speech is normal; fluent and spontaneous with normal comprehension.  Cognition:    The patient is oriented to person, place, and time;     recent and remote memory intact;     language fluent;     normal attention, concentration,     fund of knowledge Cranial Nerves:    The pupils are equal, round, and reactive to light. Attempted, pupils too small to visualize fundi. Visual fields are full to finger confrontation. Extraocular movements are intact. Trigeminal sensation is intact and the muscles of mastication are normal. The  face is symmetric. The palate elevates in the midline. Hearing intact. Voice is normal. Shoulder shrug is normal. The tongue has normal motion without fasciculations.   Coordination:    Normal   Gait:   normal.   Motor Observation:    No asymmetry, no atrophy, and no involuntary movements noted. Tone:    Normal muscle tone.    Posture:    Posture is normal. normal erect    Strength: prox weakness uppers (states rotator cuff issues) otherwise.     Strength is V/V in the upper and lower limbs.      Sensation: intact to LT     Reflex Exam:  DTR's: absent Ajs, surgical patellars, 1+ biceps.     Toes:    The toes are equiv bilaterally.   Clonus:    Clonus is absent.    Assessment/Plan:  Patient with new onset headache in the mornings: pain on palpation and swelling left maxillary sinus.  New sinusitis on last CT head: "almost complete opacification of left maxillary sinus which was not seen in the previous study." Patient  has pain on palpation of left maxillary. If can't see dr. Benjamine Mola soon to treat ask. Ross to treat. This could be cause of new left-sided headaches. I texted Dr. Harrington Challenger and asked patient to call dr Benjamine Mola (see picture below.  Other Differential: Either sleep apnea, decreased oxygen overnight (morning headaches). Send oxygen monitor overnight.Could also be occipital nerve due to neck arthritis in the neck  They prefer to treat sinusitis and if headaches do not improve will order MRI brain and cervical spine     Cc: Amelia Jo, PA,  [name], MD, Dr. Chrisandra Netters, MD  [facility] [address] [place], [place] [address]  Phone [phone] Fax [phone]  I spent over 75 minutes of face-to-face and non-face-to-face time with patient on the  1. Maxillary sinusitis, chronic   2. Morning headache   3. Maxillary pain    diagnosis.  This included previsit chart review, lab review, study review, order entry, electronic health record documentation, patient education on the different diagnostic and therapeutic options, counseling and coordination of care, risks and benefits of management, compliance, or risk factor reduction

## 2022-01-03 NOTE — Telephone Encounter (Signed)
Send overnighT ONO for patient for nocturnal hypoxemia and morning headaches. He does not use o2

## 2022-01-03 NOTE — Patient Instructions (Addendum)
Differential: Either sleep apnea, decreased oxygen overnight (morning headaches). Send oxygen monitor overnight.Could also be occipital nerve due to neck arthritis in the neck  But New sinusitis on last CT head: "almost complete opacification of left maxillary sinus which was not seen in the previous study." Patient has pain on palpation of left maxillary. If can't see dr. Benjamine Mola soon to treat ask. Ross to treat. This could be cause of new left-sided headaches.  They prefer to treat sinusitis and if headaches do not improve will order MRI brain and cervical spine

## 2022-01-04 NOTE — Telephone Encounter (Signed)
ONO order signed by Dr Jaynee Eagles, then faxed to [facility] @ [phone]. Received a receipt of confirmation.  Spoke with wife Derek [name] (on [place]) and let her know to watch for a call from [employer] to schedule ONO. Also provided [employer]'s contact info and address. She verbalized appreciation and her questions were answered.

## 2022-01-04 NOTE — Addendum Note (Signed)
Addended by: Gildardo Griffes on: 01/04/2022 07:58 AM   Modules accepted: Orders

## 2022-01-04 NOTE — Telephone Encounter (Signed)
ONO order written and is pending Dr Cathren Laine signature.

## 2022-01-09 ENCOUNTER — Other Ambulatory Visit (HOSPITAL_BASED_OUTPATIENT_CLINIC_OR_DEPARTMENT_OTHER): Payer: Self-pay

## 2022-01-10 ENCOUNTER — Other Ambulatory Visit (HOSPITAL_BASED_OUTPATIENT_CLINIC_OR_DEPARTMENT_OTHER): Payer: Self-pay

## 2022-01-16 ENCOUNTER — Other Ambulatory Visit (HOSPITAL_BASED_OUTPATIENT_CLINIC_OR_DEPARTMENT_OTHER): Payer: Self-pay

## 2022-01-20 ENCOUNTER — Other Ambulatory Visit (HOSPITAL_BASED_OUTPATIENT_CLINIC_OR_DEPARTMENT_OTHER): Payer: Self-pay

## 2022-01-20 MED ORDER — OXYBUTYNIN CHLORIDE 5 MG PO TABS
5.0000 mg | ORAL_TABLET | Freq: Three times a day (TID) | ORAL | 6 refills | Status: DC | PRN
  Filled 2022-01-20: qty 90, 30d supply, fill #0

## 2022-01-20 MED ORDER — TAMSULOSIN HCL 0.4 MG PO CAPS
0.4000 mg | ORAL_CAPSULE | Freq: Every day | ORAL | 3 refills | Status: DC
  Filled 2022-01-20: qty 30, 30d supply, fill #0

## 2022-01-23 ENCOUNTER — Ambulatory Visit
Admission: RE | Admit: 2022-01-23 | Discharge: 2022-01-23 | Disposition: A | Discharge status: Home / Self Care | Payer: HMO | Source: Ambulatory Visit | Attending: Interventional Radiology | Admitting: Interventional Radiology

## 2022-01-23 ENCOUNTER — Other Ambulatory Visit (HOSPITAL_BASED_OUTPATIENT_CLINIC_OR_DEPARTMENT_OTHER): Payer: Self-pay

## 2022-01-23 DIAGNOSIS — I82401 Acute embolism and thrombosis of unspecified deep veins of right lower extremity: Secondary | ICD-10-CM

## 2022-01-26 ENCOUNTER — Ambulatory Visit
Admission: RE | Admit: 2022-01-26 | Discharge: 2022-01-26 | Disposition: A | Discharge status: Home / Self Care | Payer: HMO | Source: Ambulatory Visit | Attending: Interventional Radiology | Admitting: Interventional Radiology

## 2022-01-26 DIAGNOSIS — I82401 Acute embolism and thrombosis of unspecified deep veins of right lower extremity: Secondary | ICD-10-CM

## 2022-01-26 HISTORY — PX: IR RADIOLOGIST EVAL & MGMT: IMG5224

## 2022-01-26 NOTE — Progress Notes (Signed)
Reason for visit: 3 mos follow up s/p treatment for extensive RLE DVT   Care Team:  Primary Care: [name], MD Hematology: [name], MD    History of present illness:   76 y/o M comorbid w PMHx significant for factor V Leiden with reported multiple prior blood clots, pancreatic CA on long-term remission since 2017 and chronic back pain. Pt had presented to his medical oncologist with worsened RLE pain and swelling while on chronic AC with Eliquis. He underwent RLE venogram and catheter-directed mechanical thrombectomy for extensive DVT on 10/08/21, and was discharged on Lovenox. He was last seen by me in clinic on 10/26/21 and was back to his daily routines. He had not been wearing compression stockings and had gotten frustrated with his BID AC injections.   He returns to clinic after having his follow up RLE venous doppler earlier this week. He and his wife continue to report decreased swelling of his RIGHT leg, though he does endorse some discomfort at the back of his leg that his wife says is from his arthritis. He was transitioned from lovenox to pradaxa by Dr [name]. He still refuses to wear compressions, states that he doesn't like them.  Review of Systems: A 12-point ROS discussed, and pertinent positives are indicated in the HPI above.  All other systems are negative.   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" no pulmonary md, sees dr [name]   Factor V Leiden mutation ([place])    "never had blood clots"   GERD (gastroesophageal reflux disease)    Hiatal hernia    History of kidney stones    multiple kidney stones-not a problem at present    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, pT1 pNX) no further treatment   History of panic attacks    History of traumatic head injury    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    Past Surgical History:  Procedure Laterality Date   ANAL FISTULECTOMY N/A 04/01/2013   Procedure: EXAM UNDER ANESTHESIA WITH ANAL FISSURectomy, sphincterotomy and internal hemorrhoidectomy;  Surgeon: Harl Bowie, MD;  Location: [facility];  Service: General;  Laterality: N/A;   APPLICATION OF WOUND VAC N/A 05/16/2015   Procedure: APPLICATION OF WOUND VAC;  Surgeon: Armandina Gemma, MD;  Location: [facility];  Service: General;  Laterality: N/A;   APPLICATION OF WOUND VAC N/A 05/20/2015   Procedure: EXHANGE OF ABDOMINAL  WOUND VAC DRESSING;  Surgeon: Armandina Gemma, MD;  Location: [facility];  Service: General;  Laterality: N/A;   COLONOSCOPY     CYSTOSCOPY N/A 05/23/2021   Procedure: Consuela Mimes;  Surgeon: Franchot Gallo, MD;  Location: [facility];  Service: Urology;  Laterality: N/A;   CYSTOSCOPY WITH INSERTION OF UROLIFT N/A 10/12/2016   Procedure: CYSTOSCOPY WITH INSERTION OF UROLIFT;  Surgeon: Franchot Gallo,  MD;  Location: [place];  Service: Urology;  Laterality: N/A;   EUS N/A 12/31/2014   Procedure: UPPER ENDOSCOPIC ULTRASOUND (EUS) LINEAR;  Surgeon: Milus Banister, MD;  [facility];  Service: Endoscopy;  Laterality: N/A;   HARDWARE REMOVAL Left 2013   left ankle   HEMORRHOIDECTOMY WITH HEMORRHOID BANDING     INCISION AND DRAINAGE ABSCESS N/A 05/14/2015   Procedure: DRAINAGE OF INFECTED PANCREATIC PSEUDOCYST;  Surgeon: Armandina Gemma, MD;  [facility];  Service: General;  Laterality: N/A;   INCISION AND DRAINAGE OF WOUND N/A 05/16/2015   Procedure: IRRIGATION AND DEBRIDEMENT WOUND;  Surgeon: Armandina Gemma, MD;  [facility];  Service: General;  Laterality: N/A;   INCISIONAL HERNIA REPAIR N/A 11/12/2015   Procedure: REPAIR INCISIONAL HERNIA WITH MESH;  Surgeon: Armandina Gemma, MD;  Location: [place];  Service: General;  Laterality: N/A;   INGUINAL HERNIA REPAIR Right 1974;  [place] N/A 11/12/2015   Procedure: INSERTION OF MESH;  Surgeon: Armandina Gemma, MD;  Location: [place];  Service: General;  Laterality: N/A;   IR GENERIC HISTORICAL  04/20/2015   IR RADIOLOGIST EVAL & MGMT 04/20/2015 [facility] INTERV RAD   IR IVC FILTER PLMT / S&I /IMG GUID/MOD SED  11/25/2020   IR RADIOLOGIST EVAL & MGMT  10/26/2021   IR RADIOLOGIST EVAL & MGMT  01/26/2022   IR RADIOLOGY PERIPHERAL GUIDED IV START  11/25/2020   IR THROMBECT VENO MECH MOD SED  10/08/2021   IR US GUIDE VASC ACCESS RIGHT  11/25/2020   IR US GUIDE VASC ACCESS RIGHT  10/08/2021   IR VENO/EXT/UNI RIGHT  10/12/2021   KNEE ARTHROSCOPY Bilateral right 08-08-2000;  left 11-23-2000   meniscal repair and chondroplasty's   KNEE ARTHROSCOPY  03/01/2012   Procedure: ARTHROSCOPY KNEE;  Surgeon: Gearlean Alf, MD;  [facility];  Service: Orthopedics;  Laterality: Left;  WITH SYNOVECTOMY    LAPAROTOMY N/A 05/14/2015   Procedure: EXPLORATORY LAPAROTOMY;  Surgeon: Armandina Gemma, MD;  [facility];   Service: General;  Laterality: N/A;   MASS EXCISION N/A 12/24/2019   Procedure: EXCISION LIP CARCINOMA;  Surgeon: Leta Baptist, MD;  Location: [place];  Service: ENT;  Laterality: N/A;   ORIF LEFT ANKLE FX  08/20/2009   distal tib-fib and malleolus   ROTATOR CUFF REPAIR Bilateral right 1994; left 1993, left 1993   TOTAL KNEE ARTHROPLASTY Right 06/02/2013   Procedure: RIGHT TOTAL KNEE ARTHROPLASTY;  Surgeon: Gearlean Alf, MD;  [facility];  Service: Orthopedics;  Laterality: Right;   TOTAL KNEE ARTHROPLASTY Left 01-31-2008   dr Wynelle Link   TOTAL KNEE REVISION Right 01/23/2018   Procedure: right knee polyethylene revision;  Surgeon: Gaynelle Arabian, MD;  [facility];  Service: Orthopedics;  Laterality: Right;  78mn   TRANSURETHRAL RESECTION OF BLADDER TUMOR N/A 05/23/2021   Procedure: TRANSURETHRAL BIOPSY OF BLADDER;  Surgeon: DFranchot Gallo MD;  [facility]  Service: Urology;  Laterality: N/A;   TRANSURETHRAL RESECTION OF PROSTATE N/A 04/21/2019   Procedure: TRANSURETHRAL RESECTION OF THE PROSTATE (TURP);  Surgeon: DFranchot Gallo MD;  [facility]  Service: Urology;  Laterality: N/A;   UMBILICAL HERNIA REPAIR  08-11-1999   dr bNinfa Linden  UPPER GASTROINTESTINAL ENDOSCOPY  sept 2016   WOUND EXPLORATION N/A 09/21/2016   Procedure: WOUND EXPLORATION AND DEBRIDEMENT ABDOMINAL WALL;  Surgeon: GArmandina Gemma MD;  [facility];  Service: General;  Laterality: N/A;    Allergies: Ditropan [oxybutynin], Quinine, Vibra-tab [doxycycline], Voltaren [diclofenac], Zoloft [sertraline], Cymbalta [duloxetine hcl], Morphine and related, Oxycontin [oxycodone hcl], Tape, Voltaren [diclofenac sodium], Zohydro er [hydrocodone bitartrate er], Fentanyl, and Neurontin [gabapentin]  Medications: Prior to Admission medications   Medication Sig Start Date End Date Taking? Authorizing Provider  acetaminophen (TYLENOL) 500 MG tablet Take 500 mg by mouth every 6  (six) hours as needed for mild pain or headache.    [provider]  amoxicillin-clavulanate (AUGMENTIN) 875-125 MG tablet Take 1 tablet by mouth every 12 (twelve) hours with food for 10 days. 01/03/22     aspirin EC 81 MG tablet Take 81 mg by mouth daily. Swallow whole.    [provider]  carvedilol (COREG) 25 MG tablet Take 1 tablet by mouth once a day every evening 07/08/21     dabigatran (PRADAXA) 150 MG CAPS capsule Take 1 capsule (150 mg total) by mouth 2 (two) times daily. 11/10/21   EVolanda Napoleon MD  diphenhydramine-acetaminophen (TYLENOL PM) 25-500 MG TABS tablet Take 2 tablets by mouth at bedtime.    [provider]  empagliflozin (JARDIANCE) 25 MG TABS tablet Take 1 tablet (25 mg total) by mouth daily. 11/14/21     furosemide (LASIX) 20 MG tablet Take 20 mg by mouth daily as needed for edema.    [provider]  Insulin Degludec FlexTouch 100 UNIT/ML SOPN Inject 70 Units into the skin daily. 11/14/21     lansoprazole (PREVACID) 15 MG capsule Take 15 mg by mouth daily as needed (for breakthrough GERD).    [provider]  losartan (COZAAR) 50 MG tablet Take 1 tablet by mouth once every evening 07/08/21     magnesium oxide (MAG-OX) 400 MG tablet Take 800 mg by mouth at bedtime.    [provider]  nystatin (MYCOSTATIN) 100000 UNIT/ML suspension Use 4-5 mL for Mouth/Throat as directed four times a day 07/08/21     ondansetron (ZOFRAN-ODT) 4 MG disintegrating tablet Take 1 tablet (4 mg total) by mouth every 8 (eight) hours as needed for nausea or vomiting. 10/10/21   Amin, AJeanella Flattery MD  oxybutynin (DITROPAN) 5 MG tablet Take 1 tablet (5 mg total) by mouth every 6 (six) to 8 (eight) hours as needed urinary urgency/frequency/leakage. 01/20/22     tamsulosin (FLOMAX) 0.4 MG CAPS capsule Take 1 capsule (0.4 mg total) by mouth at bedtime. 01/20/22        Family History  Problem Relation Age of Onset   Bone cancer Father        Jaw   Heart  disease Paternal Uncle    Diabetes Maternal Grandmother    Factor V Leiden deficiency Daughter    Colon cancer Neg Hx    Esophageal cancer  Neg Hx    Stomach cancer Neg Hx    Rectal cancer Neg Hx    Migraines Neg Hx     Social History   Socioeconomic History   Marital status: Married    Spouse name: Ivin Booty   Number of children: 2   Years of education: Not on file   Highest education level: Not on file  Occupational History   Occupation: retired   Tobacco Use   Smoking status: Former    Packs/day: 4.00    Years: 17.00    Total pack years: 68.00    Types: Cigarettes    Quit date: 02/21/1975    Years since quitting: 46.9   Smokeless tobacco: Never  Vaping Use   Vaping Use: Never used  Substance and Sexual Activity   Alcohol use: No    Alcohol/week: 0.0 standard drinks of alcohol   Drug use: No   Sexual activity: Not Currently  Other Topics Concern   Not on file  Social History Narrative   Married, retired Sport and exercise psychologist   1 son 1 daughter   2 caffeine free (will switch off once supply is finished with caffiene).       Social Determinants of Health   Financial Resource Strain: Not on file  Food Insecurity: No Food Insecurity (12/22/2019)   Hunger Vital Sign    Worried About Running Out of Food in the Last Year: Never true    Ran Out of Food in the Last Year: Never true  Transportation Needs: No Transportation Needs (12/22/2019)   PRAPARE - Hydrologist (Medical): No    Lack of Transportation (Non-Medical): No  Physical Activity: Not on file  Stress: Not on file  Social Connections: Not on file     Vital Signs: There were no vitals taken for this visit.  Physical Exam Deferred secondary to virtual visit.  Imaging:  US Venous Img Lower Unilateral Right (DVT)  Result Date: 01/23/2022 CLINICAL DATA:  Follow-up, RIGHT lower extremity DVT. History of factor V Leiden with RIGHT lower extremity DVT s/p mechanical thrombectomy on  10/08/2021. EXAM: RIGHT LOWER EXTREMITY VENOUS DOPPLER ULTRASOUND TECHNIQUE: Gray-scale sonography with compression, as well as color and duplex ultrasound, were performed to evaluate the deep venous system(s) from the level of the common femoral vein through the popliteal and proximal calf veins. COMPARISON:  RIGHT lower extremity venous duplex, 10/26/2021. And 10/04/2021. CT venogram, 10/06/2021. IR fluoroscopy, 10/08/2021. FINDINGS: VENOUS Normal compressibility of the RIGHT common femoral and visualized portions of profunda vein and great saphenous veins. Persistent, heterogeneously-echogenic, nonocclusive filling defect with incomplete compressibility involving the imaged portions of the RIGHT femoral, popliteal, posterior tibial and peroneal veins. Limited views of the contralateral common femoral vein are unremarkable. OTHER No evidence of superficial thrombophlebitis or abnormal fluid collection. Subcutaneous edema of the imaged distal RIGHT lower extremity. Limitations: Suboptimal evaluation, with poor acoustic penetration secondary to patient habitus. IMPRESSION: Residual, partially-occlusive RIGHT lower extremity femoropopliteal DVT Michaelle Birks, MD [facility] Radiology Electronically Signed   By: Michaelle Birks M.D.   On: 01/23/2022 16:08    Labs:  CBC: Recent Labs    10/10/21 0908 10/14/21 1454 11/10/21 1138 12/29/21 1455  WBC 7.9 7.0 6.9 9.1  HGB 11.8* 12.4* 13.3 12.9*  HCT 36.4* 38.6* 42.0 41.1  PLT 465* 456* 550* 492*    COAGS: Recent Labs    10/06/21 1330 10/07/21 0223 10/07/21 2114 10/08/21 0245 10/09/21 0357 10/09/21 1455  INR 1.3*  --   --   --   --   --  APTT 35   < > 100* 100* 107* 78*   < > = values in this interval not displayed.     Assessment and Plan:  76 y/o M comorbid w PMHx significant for factor V Leiden with reported multiple prior blood clots, pancreatic CA on long-term remission since 2017 and chronic  back pain. Pt known to me s/p RLE venogram and mechanical thrombectomy for extensive DVT on 10/08/21.   Residual, partially-occlusive RLE femoropopliteal DVT on today's venous duplex (01/26/22)  *AC per Heme Onc *reiterated  recommendation to wear fitted compression stockings to augment venous pump *repeat RLE Venous Duplex in 6 months. No clinic visit. *VIR follow up PRN.   Thank you for allowing me to participate in the care of this Patient. Please contact me with questions, concerns, or if new issues arise.  Electronically Signed:  Michaelle Birks, MD [facility] Radiology   Pager. [phone] Clinic. [phone]   I spent a total of 25 Minutes of non-face-to-face time in clinical consultation, greater than 50% of which was counseling/coordinating care for Mr Derek [name]. evaluation for lower extremity DVT.

## 2022-02-09 ENCOUNTER — Other Ambulatory Visit (HOSPITAL_BASED_OUTPATIENT_CLINIC_OR_DEPARTMENT_OTHER): Payer: Self-pay

## 2022-02-10 ENCOUNTER — Other Ambulatory Visit (HOSPITAL_BASED_OUTPATIENT_CLINIC_OR_DEPARTMENT_OTHER): Payer: Self-pay

## 2022-02-10 ENCOUNTER — Other Ambulatory Visit: Payer: Self-pay

## 2022-02-21 ENCOUNTER — Other Ambulatory Visit (HOSPITAL_BASED_OUTPATIENT_CLINIC_OR_DEPARTMENT_OTHER): Payer: Self-pay

## 2022-02-21 DIAGNOSIS — J0101 Acute recurrent maxillary sinusitis: Secondary | ICD-10-CM | POA: Diagnosis not present

## 2022-02-21 DIAGNOSIS — J343 Hypertrophy of nasal turbinates: Secondary | ICD-10-CM | POA: Diagnosis not present

## 2022-02-21 MED ORDER — FLUTICASONE PROPIONATE 50 MCG/ACT NA SUSP
1.0000 | Freq: Every day | NASAL | 5 refills | Status: AC
  Filled 2022-02-21: qty 16, 30d supply, fill #0
  Filled 2022-04-20: qty 16, 30d supply, fill #1

## 2022-02-24 ENCOUNTER — Other Ambulatory Visit (HOSPITAL_BASED_OUTPATIENT_CLINIC_OR_DEPARTMENT_OTHER): Payer: Self-pay

## 2022-02-24 MED ORDER — METHOCARBAMOL 500 MG PO TABS
500.0000 mg | ORAL_TABLET | Freq: Four times a day (QID) | ORAL | 2 refills | Status: DC | PRN
  Filled 2022-02-24: qty 120, 30d supply, fill #0

## 2022-03-01 ENCOUNTER — Other Ambulatory Visit (HOSPITAL_BASED_OUTPATIENT_CLINIC_OR_DEPARTMENT_OTHER): Payer: Self-pay

## 2022-03-09 ENCOUNTER — Other Ambulatory Visit (HOSPITAL_BASED_OUTPATIENT_CLINIC_OR_DEPARTMENT_OTHER): Payer: Self-pay

## 2022-03-09 DIAGNOSIS — R61 Generalized hyperhidrosis: Secondary | ICD-10-CM | POA: Diagnosis not present

## 2022-03-09 DIAGNOSIS — Z6837 Body mass index (BMI) 37.0-37.9, adult: Secondary | ICD-10-CM | POA: Diagnosis not present

## 2022-03-09 MED ORDER — PREGABALIN 50 MG PO CAPS
50.0000 mg | ORAL_CAPSULE | Freq: Every day | ORAL | 6 refills | Status: DC
  Filled 2022-03-09: qty 60, 30d supply, fill #0
  Filled 2022-05-03: qty 60, 30d supply, fill #1
  Filled 2022-06-30: qty 60, 30d supply, fill #2

## 2022-03-10 ENCOUNTER — Other Ambulatory Visit (HOSPITAL_BASED_OUTPATIENT_CLINIC_OR_DEPARTMENT_OTHER): Payer: Self-pay

## 2022-03-10 DIAGNOSIS — Z794 Long term (current) use of insulin: Secondary | ICD-10-CM | POA: Diagnosis not present

## 2022-03-10 DIAGNOSIS — E213 Hyperparathyroidism, unspecified: Secondary | ICD-10-CM | POA: Diagnosis not present

## 2022-03-10 DIAGNOSIS — I10 Essential (primary) hypertension: Secondary | ICD-10-CM | POA: Diagnosis not present

## 2022-03-10 DIAGNOSIS — Z7985 Long-term (current) use of injectable non-insulin antidiabetic drugs: Secondary | ICD-10-CM | POA: Diagnosis not present

## 2022-03-10 DIAGNOSIS — E782 Mixed hyperlipidemia: Secondary | ICD-10-CM | POA: Diagnosis not present

## 2022-03-10 DIAGNOSIS — E113393 Type 2 diabetes mellitus with moderate nonproliferative diabetic retinopathy without macular edema, bilateral: Secondary | ICD-10-CM | POA: Diagnosis not present

## 2022-03-10 DIAGNOSIS — Z87891 Personal history of nicotine dependence: Secondary | ICD-10-CM | POA: Diagnosis not present

## 2022-03-10 DIAGNOSIS — Z7984 Long term (current) use of oral hypoglycemic drugs: Secondary | ICD-10-CM | POA: Diagnosis not present

## 2022-03-10 MED ORDER — JARDIANCE 25 MG PO TABS
25.0000 mg | ORAL_TABLET | Freq: Every day | ORAL | 1 refills | Status: DC
  Filled 2022-03-10 – 2022-05-29 (×2): qty 90, 90d supply, fill #0
  Filled 2022-08-28: qty 90, 90d supply, fill #1

## 2022-03-10 MED ORDER — INSULIN DEGLUDEC FLEXTOUCH 100 UNIT/ML ~~LOC~~ SOPN
70.0000 [IU] | PEN_INJECTOR | Freq: Every day | SUBCUTANEOUS | 1 refills | Status: DC
  Filled 2022-03-10 – 2022-05-03 (×2): qty 60, 86d supply, fill #0

## 2022-03-10 MED ORDER — TRULICITY 4.5 MG/0.5ML ~~LOC~~ SOAJ
4.5000 mg | SUBCUTANEOUS | 1 refills | Status: DC
  Filled 2022-03-10: qty 2, 28d supply, fill #0
  Filled 2022-05-01: qty 2, 28d supply, fill #1
  Filled 2022-05-29: qty 2, 28d supply, fill #2
  Filled 2022-06-30: qty 2, 28d supply, fill #3
  Filled 2022-07-31: qty 2, 28d supply, fill #4
  Filled 2022-08-28: qty 2, 28d supply, fill #5

## 2022-03-13 ENCOUNTER — Other Ambulatory Visit: Payer: Self-pay

## 2022-03-13 ENCOUNTER — Other Ambulatory Visit (HOSPITAL_BASED_OUTPATIENT_CLINIC_OR_DEPARTMENT_OTHER): Payer: Self-pay

## 2022-03-14 DIAGNOSIS — E113393 Type 2 diabetes mellitus with moderate nonproliferative diabetic retinopathy without macular edema, bilateral: Secondary | ICD-10-CM | POA: Diagnosis not present

## 2022-03-16 ENCOUNTER — Other Ambulatory Visit (HOSPITAL_BASED_OUTPATIENT_CLINIC_OR_DEPARTMENT_OTHER): Payer: Self-pay

## 2022-03-16 MED ORDER — PRAVASTATIN SODIUM 20 MG PO TABS
20.0000 mg | ORAL_TABLET | ORAL | 0 refills | Status: DC
  Filled 2022-03-16: qty 24, 84d supply, fill #0
  Filled 2022-06-11: qty 16, 56d supply, fill #1

## 2022-03-23 DIAGNOSIS — E7800 Pure hypercholesterolemia, unspecified: Secondary | ICD-10-CM | POA: Diagnosis not present

## 2022-03-23 DIAGNOSIS — E1165 Type 2 diabetes mellitus with hyperglycemia: Secondary | ICD-10-CM | POA: Diagnosis not present

## 2022-03-23 DIAGNOSIS — G8929 Other chronic pain: Secondary | ICD-10-CM | POA: Diagnosis not present

## 2022-03-23 DIAGNOSIS — I10 Essential (primary) hypertension: Secondary | ICD-10-CM | POA: Diagnosis not present

## 2022-03-23 DIAGNOSIS — K219 Gastro-esophageal reflux disease without esophagitis: Secondary | ICD-10-CM | POA: Diagnosis not present

## 2022-03-30 DIAGNOSIS — Z794 Long term (current) use of insulin: Secondary | ICD-10-CM | POA: Diagnosis not present

## 2022-03-30 DIAGNOSIS — E113393 Type 2 diabetes mellitus with moderate nonproliferative diabetic retinopathy without macular edema, bilateral: Secondary | ICD-10-CM | POA: Diagnosis not present

## 2022-03-31 ENCOUNTER — Other Ambulatory Visit (HOSPITAL_BASED_OUTPATIENT_CLINIC_OR_DEPARTMENT_OTHER): Payer: Self-pay

## 2022-04-04 ENCOUNTER — Other Ambulatory Visit: Payer: Self-pay | Admitting: Otolaryngology

## 2022-04-04 DIAGNOSIS — J320 Chronic maxillary sinusitis: Secondary | ICD-10-CM | POA: Diagnosis not present

## 2022-04-04 DIAGNOSIS — J329 Chronic sinusitis, unspecified: Secondary | ICD-10-CM

## 2022-04-04 DIAGNOSIS — J343 Hypertrophy of nasal turbinates: Secondary | ICD-10-CM | POA: Diagnosis not present

## 2022-04-05 ENCOUNTER — Other Ambulatory Visit (HOSPITAL_BASED_OUTPATIENT_CLINIC_OR_DEPARTMENT_OTHER): Payer: Self-pay

## 2022-04-05 MED ORDER — LANSOPRAZOLE 30 MG PO CPDR
30.0000 mg | DELAYED_RELEASE_CAPSULE | Freq: Every day | ORAL | 0 refills | Status: DC
  Filled 2022-04-05: qty 90, 90d supply, fill #0

## 2022-04-12 ENCOUNTER — Inpatient Hospital Stay: Payer: PPO | Admitting: Hematology & Oncology

## 2022-04-12 ENCOUNTER — Inpatient Hospital Stay: Payer: PPO

## 2022-04-14 DIAGNOSIS — E113393 Type 2 diabetes mellitus with moderate nonproliferative diabetic retinopathy without macular edema, bilateral: Secondary | ICD-10-CM | POA: Diagnosis not present

## 2022-04-17 ENCOUNTER — Other Ambulatory Visit (HOSPITAL_BASED_OUTPATIENT_CLINIC_OR_DEPARTMENT_OTHER): Payer: Self-pay

## 2022-04-19 ENCOUNTER — Inpatient Hospital Stay: Payer: PPO

## 2022-04-19 ENCOUNTER — Other Ambulatory Visit: Payer: Self-pay

## 2022-04-19 ENCOUNTER — Other Ambulatory Visit (HOSPITAL_BASED_OUTPATIENT_CLINIC_OR_DEPARTMENT_OTHER): Payer: Self-pay

## 2022-04-19 ENCOUNTER — Encounter: Payer: Self-pay | Admitting: Hematology & Oncology

## 2022-04-19 ENCOUNTER — Inpatient Hospital Stay (HOSPITAL_BASED_OUTPATIENT_CLINIC_OR_DEPARTMENT_OTHER): Payer: PPO | Admitting: Hematology & Oncology

## 2022-04-19 VITALS — BP 120/56 | HR 76 | Temp 97.5°F | Resp 20 | Ht 71.0 in | Wt 266.0 lb

## 2022-04-19 DIAGNOSIS — E669 Obesity, unspecified: Secondary | ICD-10-CM | POA: Diagnosis present

## 2022-04-19 DIAGNOSIS — I10 Essential (primary) hypertension: Secondary | ICD-10-CM | POA: Diagnosis present

## 2022-04-19 DIAGNOSIS — M25511 Pain in right shoulder: Secondary | ICD-10-CM | POA: Diagnosis not present

## 2022-04-19 DIAGNOSIS — I82401 Acute embolism and thrombosis of unspecified deep veins of right lower extremity: Secondary | ICD-10-CM | POA: Diagnosis not present

## 2022-04-19 DIAGNOSIS — I824Y1 Acute embolism and thrombosis of unspecified deep veins of right proximal lower extremity: Secondary | ICD-10-CM | POA: Diagnosis present

## 2022-04-19 DIAGNOSIS — I82409 Acute embolism and thrombosis of unspecified deep veins of unspecified lower extremity: Secondary | ICD-10-CM | POA: Diagnosis not present

## 2022-04-19 DIAGNOSIS — D3A8 Other benign neuroendocrine tumors: Secondary | ICD-10-CM | POA: Diagnosis not present

## 2022-04-19 DIAGNOSIS — I82811 Embolism and thrombosis of superficial veins of right lower extremities: Secondary | ICD-10-CM | POA: Diagnosis not present

## 2022-04-19 DIAGNOSIS — R319 Hematuria, unspecified: Secondary | ICD-10-CM | POA: Diagnosis present

## 2022-04-19 DIAGNOSIS — E119 Type 2 diabetes mellitus without complications: Secondary | ICD-10-CM | POA: Diagnosis not present

## 2022-04-19 DIAGNOSIS — D6859 Other primary thrombophilia: Secondary | ICD-10-CM | POA: Diagnosis not present

## 2022-04-19 DIAGNOSIS — M7989 Other specified soft tissue disorders: Secondary | ICD-10-CM | POA: Diagnosis present

## 2022-04-19 DIAGNOSIS — F32A Depression, unspecified: Secondary | ICD-10-CM | POA: Diagnosis present

## 2022-04-19 DIAGNOSIS — Z794 Long term (current) use of insulin: Secondary | ICD-10-CM | POA: Diagnosis not present

## 2022-04-19 DIAGNOSIS — D75839 Thrombocytosis, unspecified: Secondary | ICD-10-CM

## 2022-04-19 DIAGNOSIS — I82411 Acute embolism and thrombosis of right femoral vein: Secondary | ICD-10-CM | POA: Diagnosis present

## 2022-04-19 DIAGNOSIS — K449 Diaphragmatic hernia without obstruction or gangrene: Secondary | ICD-10-CM | POA: Diagnosis not present

## 2022-04-19 DIAGNOSIS — D6851 Activated protein C resistance: Secondary | ICD-10-CM | POA: Diagnosis present

## 2022-04-19 DIAGNOSIS — M5440 Lumbago with sciatica, unspecified side: Secondary | ICD-10-CM | POA: Diagnosis not present

## 2022-04-19 DIAGNOSIS — N400 Enlarged prostate without lower urinary tract symptoms: Secondary | ICD-10-CM | POA: Diagnosis present

## 2022-04-19 DIAGNOSIS — Z87891 Personal history of nicotine dependence: Secondary | ICD-10-CM | POA: Diagnosis not present

## 2022-04-19 DIAGNOSIS — Z86718 Personal history of other venous thrombosis and embolism: Secondary | ICD-10-CM | POA: Diagnosis not present

## 2022-04-19 DIAGNOSIS — M549 Dorsalgia, unspecified: Secondary | ICD-10-CM | POA: Diagnosis present

## 2022-04-19 DIAGNOSIS — Z87442 Personal history of urinary calculi: Secondary | ICD-10-CM | POA: Diagnosis not present

## 2022-04-19 DIAGNOSIS — G8929 Other chronic pain: Secondary | ICD-10-CM | POA: Diagnosis present

## 2022-04-19 DIAGNOSIS — M1990 Unspecified osteoarthritis, unspecified site: Secondary | ICD-10-CM | POA: Diagnosis present

## 2022-04-19 DIAGNOSIS — Z7901 Long term (current) use of anticoagulants: Secondary | ICD-10-CM | POA: Insufficient documentation

## 2022-04-19 DIAGNOSIS — H9191 Unspecified hearing loss, right ear: Secondary | ICD-10-CM | POA: Diagnosis present

## 2022-04-19 DIAGNOSIS — K219 Gastro-esophageal reflux disease without esophagitis: Secondary | ICD-10-CM | POA: Diagnosis present

## 2022-04-19 DIAGNOSIS — Z79899 Other long term (current) drug therapy: Secondary | ICD-10-CM | POA: Insufficient documentation

## 2022-04-19 DIAGNOSIS — E785 Hyperlipidemia, unspecified: Secondary | ICD-10-CM | POA: Diagnosis present

## 2022-04-19 DIAGNOSIS — R5383 Other fatigue: Secondary | ICD-10-CM | POA: Diagnosis not present

## 2022-04-19 DIAGNOSIS — E611 Iron deficiency: Secondary | ICD-10-CM | POA: Diagnosis present

## 2022-04-19 DIAGNOSIS — Z8507 Personal history of malignant neoplasm of pancreas: Secondary | ICD-10-CM | POA: Diagnosis not present

## 2022-04-19 DIAGNOSIS — M5136 Other intervertebral disc degeneration, lumbar region: Secondary | ICD-10-CM | POA: Diagnosis present

## 2022-04-19 DIAGNOSIS — I82511 Chronic embolism and thrombosis of right femoral vein: Secondary | ICD-10-CM | POA: Diagnosis not present

## 2022-04-19 DIAGNOSIS — Z96653 Presence of artificial knee joint, bilateral: Secondary | ICD-10-CM | POA: Diagnosis present

## 2022-04-19 DIAGNOSIS — Z95828 Presence of other vascular implants and grafts: Secondary | ICD-10-CM | POA: Diagnosis not present

## 2022-04-19 LAB — CBC WITH DIFFERENTIAL (CANCER CENTER ONLY)
Abs Immature Granulocytes: 0.01 10*3/uL (ref 0.00–0.07)
Basophils Absolute: 0.1 10*3/uL (ref 0.0–0.1)
Basophils Relative: 2 %
Eosinophils Absolute: 0.4 10*3/uL (ref 0.0–0.5)
Eosinophils Relative: 6 %
HCT: 39.5 % (ref 39.0–52.0)
Hemoglobin: 12.2 g/dL — ABNORMAL LOW (ref 13.0–17.0)
Immature Granulocytes: 0 %
Lymphocytes Relative: 42 %
Lymphs Abs: 3 10*3/uL (ref 0.7–4.0)
MCH: 25.3 pg — ABNORMAL LOW (ref 26.0–34.0)
MCHC: 30.9 g/dL (ref 30.0–36.0)
MCV: 81.8 fL (ref 80.0–100.0)
Monocytes Absolute: 1 10*3/uL (ref 0.1–1.0)
Monocytes Relative: 13 %
Neutro Abs: 2.7 10*3/uL (ref 1.7–7.7)
Neutrophils Relative %: 37 %
Platelet Count: 380 10*3/uL (ref 150–400)
RBC: 4.83 MIL/uL (ref 4.22–5.81)
RDW: 17.8 % — ABNORMAL HIGH (ref 11.5–15.5)
WBC Count: 7.2 10*3/uL (ref 4.0–10.5)
nRBC: 0.3 % — ABNORMAL HIGH (ref 0.0–0.2)

## 2022-04-19 LAB — RETICULOCYTES
Immature Retic Fract: 20.4 % — ABNORMAL HIGH (ref 2.3–15.9)
RBC.: 4.83 MIL/uL (ref 4.22–5.81)
Retic Count, Absolute: 76.8 10*3/uL (ref 19.0–186.0)
Retic Ct Pct: 1.6 % (ref 0.4–3.1)

## 2022-04-19 LAB — CMP (CANCER CENTER ONLY)
ALT: 10 U/L (ref 0–44)
AST: 11 U/L — ABNORMAL LOW (ref 15–41)
Albumin: 4.1 g/dL (ref 3.5–5.0)
Alkaline Phosphatase: 73 U/L (ref 38–126)
Anion gap: 8 (ref 5–15)
BUN: 19 mg/dL (ref 8–23)
CO2: 21 mmol/L — ABNORMAL LOW (ref 22–32)
Calcium: 10.6 mg/dL — ABNORMAL HIGH (ref 8.9–10.3)
Chloride: 109 mmol/L (ref 98–111)
Creatinine: 0.96 mg/dL (ref 0.61–1.24)
GFR, Estimated: >60 mL/min (ref >60)
Glucose, Bld: 135 mg/dL — ABNORMAL HIGH (ref 70–99)
Potassium: 3.9 mmol/L (ref 3.5–5.1)
Sodium: 138 mmol/L (ref 135–145)
Total Bilirubin: 0.4 mg/dL (ref 0.3–1.2)
Total Protein: 6.8 g/dL (ref 6.5–8.1)

## 2022-04-19 LAB — LACTATE DEHYDROGENASE: LDH: 111 U/L (ref 98–192)

## 2022-04-19 LAB — FERRITIN: Ferritin: 7 ng/mL — ABNORMAL LOW (ref 24–336)

## 2022-04-19 MED ORDER — DABIGATRAN ETEXILATE MESYLATE 150 MG PO CAPS
150.0000 mg | ORAL_CAPSULE | Freq: Two times a day (BID) | ORAL | 6 refills | Status: DC
  Filled 2022-04-19: qty 180, 90d supply, fill #0

## 2022-04-19 NOTE — Progress Notes (Signed)
Hematology and Oncology Follow Up Visit  Derek [name] [id number] 1945-09-05 77 y.o. 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  The main problem that he is having right now is that he has had hematuria.  Unfortunately, he is on blood thinner.  We really cannot stop the Pradaxa on him.  He does see Urology.  I think he has an appointment to see them soon.  He also is having some more pain and swelling in the right lower leg.  His last Doppler that was done was back in December.  I think would be worthwhile getting another Doppler on him.  He still has a lot of issues with pain.  He has had chronic back pain.  He has a chronic abdominal pain.  He does have multiple health issues.  He does have diabetes but this seems to be doing much better from what he says.  He has had no fever.  He has had no problems with COVID.  Overall, I would have said that his performance status is probably ECOG 2.     Medications:  Current Outpatient Medications:    acetaminophen (TYLENOL) 500 MG tablet, Take 500 mg by mouth every 6 (six) hours as needed for mild pain or headache., Disp: , Rfl:    aspirin EC 81 MG tablet, Take 81 mg by mouth daily. Swallow whole., Disp: , Rfl:    carvedilol (COREG) 25 MG tablet, Take 1 tablet by mouth once a day every evening, Disp: 90 tablet, Rfl: 4   dabigatran (PRADAXA) 150 MG CAPS capsule, Take 1 capsule (150 mg total) by mouth 2 (two) times daily., Disp: 60 capsule, Rfl: 12   diphenhydramine-acetaminophen (TYLENOL PM) 25-500 MG TABS tablet, Take 1 tablet by mouth at bedtime., Disp: , Rfl:    Dulaglutide (TRULICITY) 4.5 0000000 SOPN, Inject 4.5 mg into the skin once a week., Disp: 6 mL, Rfl: 1   empagliflozin  (JARDIANCE) 25 MG TABS tablet, Take 1 tablet (25 mg total) by mouth daily., Disp: 90 tablet, Rfl: 1   fluticasone (FLONASE) 50 MCG/ACT nasal spray, Place 2 sprays into both nostrils daily., Disp: 16 g, Rfl: 5   furosemide (LASIX) 20 MG tablet, Take 20 mg by mouth daily as needed for edema., Disp: , Rfl:    Insulin Degludec FlexTouch 100 UNIT/ML SOPN, Inject 70 Units into the skin daily., Disp: 60 mL, Rfl: 1   lansoprazole (PREVACID) 15 MG capsule, Take 15 mg by mouth daily as needed (for breakthrough GERD)., Disp: , Rfl:    lansoprazole (PREVACID) 30 MG capsule, Take 1 capsule (30 mg total) by mouth daily before a meal., Disp: 90 capsule, Rfl: 0   losartan (COZAAR) 50 MG tablet, Take 1 tablet by mouth once every evening, Disp: 90 tablet, Rfl: 4   magnesium oxide (MAG-OX) 400 MG tablet, Take 800 mg by mouth at bedtime., Disp: , Rfl:    nystatin (MYCOSTATIN) 100000 UNIT/ML suspension, Use 4-5 mL for Mouth/Throat as directed four times a day, Disp: 240 mL, Rfl: 3   ondansetron (ZOFRAN-ODT) 4 MG disintegrating tablet, Take 1 tablet (4 mg total) by mouth every 8 (eight) hours as needed for nausea or vomiting., Disp: 30 tablet, Rfl: 0  pravastatin (PRAVACHOL) 20 MG tablet, Take 1 tablet (20 mg total) by mouth 2 (two) times a week., Disp: 40 tablet, Rfl: 0   pregabalin (LYRICA) 50 MG capsule, Take 1-2 capsules (50-100 mg total) by mouth at bedtime., Disp: 60 capsule, Rfl: 6   amoxicillin-clavulanate (AUGMENTIN) 875-125 MG tablet, Take 1 tablet by mouth every 12 (twelve) hours with food for 10 days. (Patient not taking: Reported on 04/19/2022), Disp: 20 tablet, Rfl: 0   methocarbamol (ROBAXIN) 500 MG tablet, Take 1 tablet (500 mg total) by mouth 4 (four) times daily as needed. (Patient not taking: Reported on 04/19/2022), Disp: 120 tablet, Rfl: 2   oxybutynin (DITROPAN) 5 MG tablet, Take 1 tablet (5 mg total) by mouth every 6 (six) to 8 (eight) hours as needed urinary urgency/frequency/leakage. (Patient not  taking: Reported on 04/19/2022), Disp: 90 tablet, Rfl: 6   tamsulosin (FLOMAX) 0.4 MG CAPS capsule, Take 1 capsule (0.4 mg total) by mouth at bedtime. (Patient not taking: Reported on 04/19/2022), Disp: 30 capsule, Rfl: 3  Allergies:  Allergies  Allergen Reactions   Ditropan [Oxybutynin] Swelling and Other (See Comments)   Quinine Other (See Comments)    Platelets dropped- consumptive coagulopathy     Vibra-Tab [Doxycycline] Other (See Comments)    Epistaxis     Voltaren [Diclofenac] Swelling and Other (See Comments)    Elevated liver enzymes Lip swelling    Zoloft [Sertraline] Other (See Comments)    Insomnia Acute urinary retention   Cymbalta [Duloxetine Hcl] Other (See Comments)    Night sweats   Morphine And Related Nausea And Vomiting, Swelling and Other (See Comments)    Facial and lip swelling OK to use IR form of hydrocodone and oxycodone   Oxycontin [Oxycodone Hcl] Swelling and Other (See Comments)    Lip swelling OK to use IR form   Tape Other (See Comments)    Skin tears easily  SKIN IS FRAGILE- CERTAIN BANDAGES TEAR OFF THE SKIN, "BANDAIDS ARE OKAY."   Voltaren [Diclofenac Sodium] Other (See Comments)    Elevated liver enzymes   Zohydro Er [Hydrocodone Bitartrate Er] Swelling    Facial swelling OK to use IR form   Fentanyl Nausea And Vomiting and Other (See Comments)    Nausea, vomiting ad headaches   Neurontin [Gabapentin] Other (See Comments)    Unknown reaction     Past Medical History, Surgical history, Social history, and Family History were reviewed and updated.  Review of Systems: Review of Systems  Constitutional:  Positive for fatigue.  HENT:  Negative.    Eyes: Negative.   Respiratory: Negative.    Cardiovascular:  Positive for leg swelling.  Gastrointestinal: Negative.   Endocrine: Negative.   Genitourinary:  Positive for hematuria.   Musculoskeletal:  Positive for back pain, myalgias and neck pain.  Neurological:  Positive for dizziness  and headaches.  Hematological: Negative.   Psychiatric/Behavioral: Negative.      Physical Exam:  height is '5\' 11"'$  (1.803 m) and weight is 266 lb (120.7 kg). His temperature is 97.5 F (36.4 C) (abnormal). His blood pressure is 120/56 (abnormal) and his pulse is 76. His respiration is 20 and oxygen saturation is 98%.   Wt Readings from Last 3 Encounters:  04/19/22 266 lb (120.7 kg)  01/03/22 268 lb 3.2 oz (121.7 kg)  12/29/21 266 lb (120.7 kg)    Physical Exam Vitals reviewed.  HENT:     Head: Normocephalic and atraumatic.  Eyes:     Pupils: Pupils are equal, round,  and reactive to light.  Cardiovascular:     Rate and Rhythm: Normal rate and regular rhythm.     Heart sounds: Normal heart sounds.  Pulmonary:     Effort: Pulmonary effort is normal.     Breath sounds: Normal breath sounds.  Abdominal:     General: Bowel sounds are normal.     Palpations: Abdomen is soft.  Musculoskeletal:        General: No tenderness or deformity. Normal range of motion.     Cervical back: Normal range of motion.     Comments: His extremities does show some mild swelling in the right leg.  It is not as firm.  He has good warmth to the leg.  He has palpable pulses in the distal extremity.    Lymphadenopathy:     Cervical: No cervical adenopathy.  Skin:    General: Skin is warm and dry.     Findings: No erythema or rash.  Neurological:     Mental Status: He is alert and oriented to person, place, and time.  Psychiatric:        Behavior: Behavior normal.        Thought Content: Thought content normal.        Judgment: Judgment normal.     Lab Results  Component Value Date   WBC 7.2 04/19/2022   HGB 12.2 (L) 04/19/2022   HCT 39.5 04/19/2022   MCV 81.8 04/19/2022   PLT 380 04/19/2022     Chemistry      Component Value Date/Time   NA 138 04/19/2022 1510   K 3.9 04/19/2022 1510   CL 109 04/19/2022 1510   CO2 21 (L) 04/19/2022 1510   BUN 19 04/19/2022 1510   CREATININE 0.96  04/19/2022 1510      Component Value Date/Time   CALCIUM 10.6 (H) 04/19/2022 1510   ALKPHOS 73 04/19/2022 1510   AST 11 (L) 04/19/2022 1510   ALT 10 04/19/2022 1510   BILITOT 0.4 04/19/2022 1510       Impression and Plan: Derek [name] is a very nice 77 year old white male.  He has had incredible history.  He has had a past history of a neuroendocrine pancreatic tumor.  He apparently was hospitalized for 4 months because of complications.  He now has had recurrent thromboembolic disease.  He required a thrombectomy back in September.  Again this worked incredibly well for him.  He is on Pradaxa.  He will be on Pradaxa for life.  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  We will go ahead and get another Doppler of his right leg.  I suspect that he probably was have some kind of residual thrombus.  This could certainly be a postphlebitic issue that he is just going to have to manage.  I told her to try to apply some topical hydrocortisone cream to this area pain in the right lower leg.  The thrombocytosis has resolved.  He is going to have to see urology.  I suspect that he is going be iron deficient by the decrease in his MCV.  I do believe that he does have a IVC filter in place.  We will have to see him back in about 2 months.  If, there is a problem with the right leg on the Doppler, he may need to have another thrombectomy.  Other that, we may need to have his anticoagulation changed to something the injectable.    [name], MD  2/28/20244:02 PM

## 2022-04-20 LAB — IRON AND IRON BINDING CAPACITY (CC-WL,HP ONLY)
Iron: 33 ug/dL — ABNORMAL LOW (ref 45–182)
Saturation Ratios: 8 % — ABNORMAL LOW (ref 17.9–39.5)
TIBC: 406 ug/dL (ref 250–450)
UIBC: 373 ug/dL (ref 117–376)

## 2022-04-21 ENCOUNTER — Encounter (HOSPITAL_BASED_OUTPATIENT_CLINIC_OR_DEPARTMENT_OTHER): Payer: Self-pay

## 2022-04-21 ENCOUNTER — Inpatient Hospital Stay (HOSPITAL_BASED_OUTPATIENT_CLINIC_OR_DEPARTMENT_OTHER)
Admission: EM | Admit: 2022-04-21 | Discharge: 2022-04-25 | DRG: 300 | Disposition: A | Discharge status: Home / Self Care | Payer: PPO | Attending: Internal Medicine | Admitting: Internal Medicine

## 2022-04-21 ENCOUNTER — Ambulatory Visit (HOSPITAL_BASED_OUTPATIENT_CLINIC_OR_DEPARTMENT_OTHER)
Admission: RE | Admit: 2022-04-21 | Discharge: 2022-04-21 | Disposition: A | Discharge status: Home / Self Care | Payer: PPO | Source: Ambulatory Visit | Attending: Hematology & Oncology | Admitting: Hematology & Oncology

## 2022-04-21 ENCOUNTER — Other Ambulatory Visit: Payer: Self-pay

## 2022-04-21 DIAGNOSIS — Z79899 Other long term (current) drug therapy: Secondary | ICD-10-CM

## 2022-04-21 DIAGNOSIS — Z87820 Personal history of traumatic brain injury: Secondary | ICD-10-CM

## 2022-04-21 DIAGNOSIS — E669 Obesity, unspecified: Secondary | ICD-10-CM | POA: Diagnosis present

## 2022-04-21 DIAGNOSIS — Z86718 Personal history of other venous thrombosis and embolism: Secondary | ICD-10-CM

## 2022-04-21 DIAGNOSIS — E611 Iron deficiency: Secondary | ICD-10-CM | POA: Diagnosis present

## 2022-04-21 DIAGNOSIS — M5136 Other intervertebral disc degeneration, lumbar region: Secondary | ICD-10-CM | POA: Diagnosis present

## 2022-04-21 DIAGNOSIS — D6851 Activated protein C resistance: Secondary | ICD-10-CM | POA: Diagnosis present

## 2022-04-21 DIAGNOSIS — E119 Type 2 diabetes mellitus without complications: Secondary | ICD-10-CM | POA: Diagnosis not present

## 2022-04-21 DIAGNOSIS — I82409 Acute embolism and thrombosis of unspecified deep veins of unspecified lower extremity: Secondary | ICD-10-CM | POA: Diagnosis not present

## 2022-04-21 DIAGNOSIS — M5440 Lumbago with sciatica, unspecified side: Secondary | ICD-10-CM

## 2022-04-21 DIAGNOSIS — Z7982 Long term (current) use of aspirin: Secondary | ICD-10-CM

## 2022-04-21 DIAGNOSIS — Z91048 Other nonmedicinal substance allergy status: Secondary | ICD-10-CM

## 2022-04-21 DIAGNOSIS — Z885 Allergy status to narcotic agent status: Secondary | ICD-10-CM

## 2022-04-21 DIAGNOSIS — D3A8 Other benign neuroendocrine tumors: Secondary | ICD-10-CM

## 2022-04-21 DIAGNOSIS — M549 Dorsalgia, unspecified: Secondary | ICD-10-CM | POA: Diagnosis present

## 2022-04-21 DIAGNOSIS — M1990 Unspecified osteoarthritis, unspecified site: Secondary | ICD-10-CM | POA: Diagnosis present

## 2022-04-21 DIAGNOSIS — I82411 Acute embolism and thrombosis of right femoral vein: Principal | ICD-10-CM | POA: Diagnosis present

## 2022-04-21 DIAGNOSIS — Z832 Family history of diseases of the blood and blood-forming organs and certain disorders involving the immune mechanism: Secondary | ICD-10-CM

## 2022-04-21 DIAGNOSIS — Z713 Dietary counseling and surveillance: Secondary | ICD-10-CM

## 2022-04-21 DIAGNOSIS — E785 Hyperlipidemia, unspecified: Secondary | ICD-10-CM | POA: Diagnosis present

## 2022-04-21 DIAGNOSIS — Z7985 Long-term (current) use of injectable non-insulin antidiabetic drugs: Secondary | ICD-10-CM

## 2022-04-21 DIAGNOSIS — Z95828 Presence of other vascular implants and grafts: Secondary | ICD-10-CM

## 2022-04-21 DIAGNOSIS — Z87442 Personal history of urinary calculi: Secondary | ICD-10-CM

## 2022-04-21 DIAGNOSIS — Z90411 Acquired partial absence of pancreas: Secondary | ICD-10-CM

## 2022-04-21 DIAGNOSIS — Z87891 Personal history of nicotine dependence: Secondary | ICD-10-CM

## 2022-04-21 DIAGNOSIS — I10 Essential (primary) hypertension: Secondary | ICD-10-CM | POA: Diagnosis not present

## 2022-04-21 DIAGNOSIS — I82401 Acute embolism and thrombosis of unspecified deep veins of right lower extremity: Secondary | ICD-10-CM

## 2022-04-21 DIAGNOSIS — I824Y1 Acute embolism and thrombosis of unspecified deep veins of right proximal lower extremity: Principal | ICD-10-CM

## 2022-04-21 DIAGNOSIS — Z8507 Personal history of malignant neoplasm of pancreas: Secondary | ICD-10-CM

## 2022-04-21 DIAGNOSIS — K219 Gastro-esophageal reflux disease without esophagitis: Secondary | ICD-10-CM | POA: Diagnosis present

## 2022-04-21 DIAGNOSIS — R319 Hematuria, unspecified: Secondary | ICD-10-CM | POA: Diagnosis present

## 2022-04-21 DIAGNOSIS — M7989 Other specified soft tissue disorders: Secondary | ICD-10-CM | POA: Diagnosis present

## 2022-04-21 DIAGNOSIS — Z888 Allergy status to other drugs, medicaments and biological substances status: Secondary | ICD-10-CM

## 2022-04-21 DIAGNOSIS — H9191 Unspecified hearing loss, right ear: Secondary | ICD-10-CM | POA: Diagnosis present

## 2022-04-21 DIAGNOSIS — Z7984 Long term (current) use of oral hypoglycemic drugs: Secondary | ICD-10-CM

## 2022-04-21 DIAGNOSIS — D75839 Thrombocytosis, unspecified: Secondary | ICD-10-CM | POA: Diagnosis present

## 2022-04-21 DIAGNOSIS — Z9079 Acquired absence of other genital organ(s): Secondary | ICD-10-CM

## 2022-04-21 DIAGNOSIS — Z794 Long term (current) use of insulin: Secondary | ICD-10-CM

## 2022-04-21 DIAGNOSIS — N400 Enlarged prostate without lower urinary tract symptoms: Secondary | ICD-10-CM | POA: Diagnosis present

## 2022-04-21 DIAGNOSIS — F32A Depression, unspecified: Secondary | ICD-10-CM | POA: Diagnosis present

## 2022-04-21 DIAGNOSIS — Z9081 Acquired absence of spleen: Secondary | ICD-10-CM

## 2022-04-21 DIAGNOSIS — D500 Iron deficiency anemia secondary to blood loss (chronic): Secondary | ICD-10-CM

## 2022-04-21 DIAGNOSIS — G8929 Other chronic pain: Secondary | ICD-10-CM

## 2022-04-21 DIAGNOSIS — Z6836 Body mass index (BMI) 36.0-36.9, adult: Secondary | ICD-10-CM

## 2022-04-21 DIAGNOSIS — Z96653 Presence of artificial knee joint, bilateral: Secondary | ICD-10-CM | POA: Diagnosis present

## 2022-04-21 LAB — URINALYSIS, ROUTINE W REFLEX MICROSCOPIC
Bilirubin Urine: NEGATIVE
Glucose, UA: >=500 mg/dL — AB
Ketones, ur: NEGATIVE mg/dL
Leukocytes,Ua: NEGATIVE
Nitrite: NEGATIVE
Protein, ur: NEGATIVE mg/dL
Specific Gravity, Urine: 1.015 (ref 1.005–1.030)
pH: 5.5 (ref 5.0–8.0)

## 2022-04-21 LAB — URINALYSIS, MICROSCOPIC (REFLEX)
RBC / HPF: >50 RBC/hpf (ref 0–5)
WBC, UA: NONE SEEN WBC/hpf (ref 0–5)

## 2022-04-21 LAB — CBC WITH DIFFERENTIAL/PLATELET
Abs Immature Granulocytes: 0.01 10*3/uL (ref 0.00–0.07)
Basophils Absolute: 0.1 10*3/uL (ref 0.0–0.1)
Basophils Relative: 1 %
Eosinophils Absolute: 0.4 10*3/uL (ref 0.0–0.5)
Eosinophils Relative: 6 %
HCT: 35.9 % — ABNORMAL LOW (ref 39.0–52.0)
Hemoglobin: 11.3 g/dL — ABNORMAL LOW (ref 13.0–17.0)
Immature Granulocytes: 0 %
Lymphocytes Relative: 36 %
Lymphs Abs: 2.7 10*3/uL (ref 0.7–4.0)
MCH: 25 pg — ABNORMAL LOW (ref 26.0–34.0)
MCHC: 31.5 g/dL (ref 30.0–36.0)
MCV: 79.4 fL — ABNORMAL LOW (ref 80.0–100.0)
Monocytes Absolute: 1.2 10*3/uL — ABNORMAL HIGH (ref 0.1–1.0)
Monocytes Relative: 16 %
Neutro Abs: 2.9 10*3/uL (ref 1.7–7.7)
Neutrophils Relative %: 41 %
Platelets: 514 10*3/uL — ABNORMAL HIGH (ref 150–400)
RBC: 4.52 MIL/uL (ref 4.22–5.81)
RDW: 17.9 % — ABNORMAL HIGH (ref 11.5–15.5)
WBC: 7.3 10*3/uL (ref 4.0–10.5)
nRBC: 0 % (ref 0.0–0.2)

## 2022-04-21 LAB — ACETAMINOPHEN LEVEL: Acetaminophen (Tylenol), Serum: <10 ug/mL — ABNORMAL LOW (ref 10–30)

## 2022-04-21 LAB — APTT: aPTT: 55 seconds — ABNORMAL HIGH (ref 24–36)

## 2022-04-21 LAB — BASIC METABOLIC PANEL
Anion gap: 4 — ABNORMAL LOW (ref 5–15)
BUN: 16 mg/dL (ref 8–23)
CO2: 21 mmol/L — ABNORMAL LOW (ref 22–32)
Calcium: 9.4 mg/dL (ref 8.9–10.3)
Chloride: 111 mmol/L (ref 98–111)
Creatinine, Ser: 0.85 mg/dL (ref 0.61–1.24)
GFR, Estimated: >60 mL/min (ref >60)
Glucose, Bld: 82 mg/dL (ref 70–99)
Potassium: 3.9 mmol/L (ref 3.5–5.1)
Sodium: 136 mmol/L (ref 135–145)

## 2022-04-21 LAB — GLUCOSE, CAPILLARY: Glucose-Capillary: 68 mg/dL — ABNORMAL LOW (ref 70–99)

## 2022-04-21 LAB — PROTIME-INR
INR: 1.5 — ABNORMAL HIGH (ref 0.8–1.2)
Prothrombin Time: 17.6 seconds — ABNORMAL HIGH (ref 11.4–15.2)

## 2022-04-21 LAB — CBG MONITORING, ED
Glucose-Capillary: 61 mg/dL — ABNORMAL LOW (ref 70–99)
Glucose-Capillary: 105 mg/dL — ABNORMAL HIGH (ref 70–99)

## 2022-04-21 LAB — SALICYLATE LEVEL: Salicylate Lvl: <7 mg/dL — ABNORMAL LOW (ref 7.0–30.0)

## 2022-04-21 MED ORDER — LORATADINE 10 MG PO TABS
10.0000 mg | ORAL_TABLET | Freq: Every day | ORAL | Status: DC
  Administered 2022-04-22 – 2022-04-24 (×4): 10 mg via ORAL
  Filled 2022-04-21 (×4): qty 1

## 2022-04-21 MED ORDER — ONDANSETRON HCL 4 MG/2ML IJ SOLN: 4.0000 mg | Freq: Four times a day (QID) | INTRAMUSCULAR | Status: DC | PRN

## 2022-04-21 MED ORDER — ASPIRIN 81 MG PO TBEC
81.0000 mg | DELAYED_RELEASE_TABLET | Freq: Every day | ORAL | Status: DC
  Administered 2022-04-22 – 2022-04-25 (×4): 81 mg via ORAL
  Filled 2022-04-21 (×4): qty 1

## 2022-04-21 MED ORDER — LOSARTAN POTASSIUM 50 MG PO TABS
50.0000 mg | ORAL_TABLET | Freq: Every day | ORAL | Status: DC
  Administered 2022-04-22 – 2022-04-24 (×4): 50 mg via ORAL
  Filled 2022-04-21 (×4): qty 1

## 2022-04-21 MED ORDER — INSULIN ASPART 100 UNIT/ML IJ SOLN
0.0000 [IU] | Freq: Three times a day (TID) | INTRAMUSCULAR | Status: DC
  Administered 2022-04-22: 4 [IU] via SUBCUTANEOUS
  Administered 2022-04-23: 3 [IU] via SUBCUTANEOUS
  Administered 2022-04-25 (×2): 4 [IU] via SUBCUTANEOUS

## 2022-04-21 MED ORDER — ACETAMINOPHEN 650 MG RE SUPP: 650.0000 mg | Freq: Four times a day (QID) | RECTAL | Status: DC | PRN

## 2022-04-21 MED ORDER — HEPARIN BOLUS VIA INFUSION
3500.0000 [IU] | Freq: Once | INTRAVENOUS | Status: AC
  Administered 2022-04-21: 3500 [IU] via INTRAVENOUS

## 2022-04-21 MED ORDER — DIPHENHYDRAMINE HCL 25 MG PO CAPS
25.0000 mg | ORAL_CAPSULE | Freq: Every day | ORAL | Status: DC
  Administered 2022-04-22 – 2022-04-23 (×3): 25 mg via ORAL
  Filled 2022-04-21 (×4): qty 1

## 2022-04-21 MED ORDER — EMPAGLIFLOZIN 25 MG PO TABS
25.0000 mg | ORAL_TABLET | Freq: Every day | ORAL | Status: DC
  Administered 2022-04-22 – 2022-04-25 (×3): 25 mg via ORAL
  Filled 2022-04-21 (×4): qty 1

## 2022-04-21 MED ORDER — DIPHENHYDRAMINE-APAP (SLEEP) 25-500 MG PO TABS: 1.0000 | ORAL_TABLET | Freq: Every day | ORAL | Status: DC

## 2022-04-21 MED ORDER — PRAVASTATIN SODIUM 20 MG PO TABS
20.0000 mg | ORAL_TABLET | ORAL | Status: DC
  Administered 2022-04-22 – 2022-04-24 (×2): 20 mg via ORAL
  Filled 2022-04-21 (×2): qty 1

## 2022-04-21 MED ORDER — DULAGLUTIDE 4.5 MG/0.5ML ~~LOC~~ SOAJ: 4.5000 mg | SUBCUTANEOUS | Status: DC

## 2022-04-21 MED ORDER — INSULIN DEGLUDEC 100 UNIT/ML ~~LOC~~ SOPN: 50.0000 [IU] | PEN_INJECTOR | SUBCUTANEOUS | Status: DC

## 2022-04-21 MED ORDER — ONDANSETRON HCL 4 MG PO TABS
4.0000 mg | ORAL_TABLET | Freq: Four times a day (QID) | ORAL | Status: DC | PRN
  Administered 2022-04-23 (×2): 4 mg via ORAL
  Filled 2022-04-21 (×2): qty 1

## 2022-04-21 MED ORDER — ACETAMINOPHEN 500 MG PO TABS
500.0000 mg | ORAL_TABLET | Freq: Every day | ORAL | Status: DC
  Administered 2022-04-22 – 2022-04-23 (×3): 500 mg via ORAL
  Filled 2022-04-21 (×4): qty 1

## 2022-04-21 MED ORDER — FLUTICASONE PROPIONATE 50 MCG/ACT NA SUSP: 1.0000 | Freq: Every day | NASAL | Status: DC | PRN

## 2022-04-21 MED ORDER — INSULIN ASPART 100 UNIT/ML IJ SOLN: 0.0000 [IU] | Freq: Every day | INTRAMUSCULAR | Status: DC

## 2022-04-21 MED ORDER — HEPARIN (PORCINE) 25000 UT/250ML-% IV SOLN
1700.0000 [IU]/h | INTRAVENOUS | Status: DC
  Administered 2022-04-21 – 2022-04-24 (×6): 1700 [IU]/h via INTRAVENOUS
  Filled 2022-04-21 (×6): qty 250

## 2022-04-21 MED ORDER — ACETAMINOPHEN 325 MG PO TABS
650.0000 mg | ORAL_TABLET | Freq: Four times a day (QID) | ORAL | Status: DC | PRN
  Administered 2022-04-22: 650 mg via ORAL
  Filled 2022-04-21: qty 2

## 2022-04-21 MED ORDER — LEVOCETIRIZINE DIHYDROCHLORIDE 5 MG PO TABS: 5.0000 mg | ORAL_TABLET | Freq: Every evening | ORAL | Status: DC

## 2022-04-21 MED ORDER — DOCUSATE SODIUM 100 MG PO CAPS
100.0000 mg | ORAL_CAPSULE | Freq: Two times a day (BID) | ORAL | Status: DC
  Administered 2022-04-22 – 2022-04-25 (×8): 100 mg via ORAL
  Filled 2022-04-21 (×8): qty 1

## 2022-04-21 MED ORDER — PREGABALIN 50 MG PO CAPS: 50.0000 mg | ORAL_CAPSULE | Freq: Every day | ORAL | Status: DC

## 2022-04-21 MED ORDER — PANTOPRAZOLE SODIUM 40 MG PO TBEC
40.0000 mg | DELAYED_RELEASE_TABLET | Freq: Every day | ORAL | Status: DC
  Administered 2022-04-22 – 2022-04-25 (×4): 40 mg via ORAL
  Filled 2022-04-21 (×4): qty 1

## 2022-04-21 MED ORDER — INSULIN GLARGINE-YFGN 100 UNIT/ML ~~LOC~~ SOLN
50.0000 [IU] | Freq: Every day | SUBCUTANEOUS | Status: DC
  Administered 2022-04-22 – 2022-04-25 (×3): 50 [IU] via SUBCUTANEOUS
  Filled 2022-04-21 (×4): qty 0.5

## 2022-04-21 MED ORDER — CARVEDILOL 25 MG PO TABS
25.0000 mg | ORAL_TABLET | Freq: Every day | ORAL | Status: DC
  Administered 2022-04-22 – 2022-04-24 (×4): 25 mg via ORAL
  Filled 2022-04-21 (×4): qty 1

## 2022-04-21 MED ORDER — MAGNESIUM OXIDE -MG SUPPLEMENT 400 (240 MG) MG PO TABS
800.0000 mg | ORAL_TABLET | Freq: Every day | ORAL | Status: DC
  Administered 2022-04-22 – 2022-04-24 (×4): 800 mg via ORAL
  Filled 2022-04-21 (×4): qty 2

## 2022-04-21 NOTE — ED Provider Notes (Signed)
[facility] Provider Note   CSN: [id number] Arrival date & time: 04/21/22  1616     History {Add pertinent medical, surgical, social history, OB history to HPI:1} Chief Complaint  Patient presents with  . Leg Pain    Derek SLUTZKY Sr. is a 77 y.o. 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  Patient went to PCP today and reported 1 week of RLE throbbing constant pain and swelling. Similar to prior DVTs. [place] demonstarted " occlusive DVT is noted in the right profundus femoral, superficial femoral, and popliteal veins.  1.2 cm enlarged lymph node is noted in the right groin area which most likely is reactive or inflammatory in etiology." I received a call from PCP Dr. [name] informing me of the patient and recommending admission for heparin and possible thrombectomy. Patient states he has received thrombectomy in the past for DVT. Had previously been on lovenox and was transitioned to pradaxa in September 2023. This morning patient also reported hematuria, no dysuria/fevers/chills/retention. Patient endorsing taking 6 extra strength tylenol at one time every night for pain management. States he has been on dilaudid PO in the past but it stopped working. His wife states his doctor and pharmacist have talked to him about this amount of tylenol but he continues to take it.  Per chart review had thrombectomy w/ IR of RLE DVT in August 2023.  HPI     Home Medications Prior to Admission medications   Medication Sig Start Date End Date Taking? Authorizing Provider  acetaminophen (TYLENOL) 500 MG tablet Take 500 mg by mouth every 6 (six) hours as needed for mild pain or headache.    [provider]  amoxicillin-clavulanate (AUGMENTIN) 875-125 MG tablet Take 1 tablet by mouth every 12 (twelve) hours with food for 10 days. Patient not taking: Reported on 04/19/2022  01/03/22     aspirin EC 81 MG tablet Take 81 mg by mouth daily. Swallow whole.    [provider]  carvedilol (COREG) 25 MG tablet Take 1 tablet by mouth once a day every evening 07/08/21     dabigatran (PRADAXA) 150 MG CAPS capsule Take 1 capsule (150 mg total) by mouth 2 (two) times daily. 04/19/22   [name], MD  diphenhydramine-acetaminophen (TYLENOL PM) 25-500 MG TABS tablet Take 1 tablet by mouth at bedtime.    [provider]  Dulaglutide (TRULICITY) 4.5 0000000 SOPN Inject 4.5 mg into the skin once a week. 03/10/22     empagliflozin (JARDIANCE) 25 MG TABS tablet Take 1 tablet (25 mg total) by mouth daily. 03/10/22     fluticasone (FLONASE) 50 MCG/ACT nasal spray Place 2 sprays into both nostrils daily. 02/21/22     furosemide (LASIX) 20 MG tablet Take 20 mg by mouth daily as needed for edema.    [provider]  Insulin Degludec FlexTouch 100 UNIT/ML SOPN Inject 70 Units into the skin daily. 03/10/22     lansoprazole (PREVACID) 15 MG capsule Take 15 mg by mouth daily as needed (for breakthrough GERD).    [provider]  lansoprazole (PREVACID) 30 MG capsule Take 1 capsule (30 mg total) by mouth daily before a meal. 04/05/22     losartan (COZAAR) 50 MG tablet Take 1 tablet by mouth once every evening 07/08/21     magnesium oxide (MAG-OX) 400 MG tablet Take 800 mg by mouth at  bedtime.    [provider]  methocarbamol (ROBAXIN) 500 MG tablet Take 1 tablet (500 mg total) by mouth 4 (four) times daily as needed. Patient not taking: Reported on 04/19/2022 02/24/22     nystatin (MYCOSTATIN) 100000 UNIT/ML suspension Use 4-5 mL for Mouth/Throat as directed four times a day 07/08/21     ondansetron (ZOFRAN-ODT) 4 MG disintegrating tablet Take 1 tablet (4 mg total) by mouth every 8 (eight) hours as needed for nausea or vomiting. 10/10/21   Amin, Jeanella Flattery, MD  oxybutynin (DITROPAN) 5 MG tablet Take 1 tablet (5 mg total) by mouth every 6 (six) to 8 (eight)  hours as needed urinary urgency/frequency/leakage. Patient not taking: Reported on 04/19/2022 01/20/22     pravastatin (PRAVACHOL) 20 MG tablet Take 1 tablet (20 mg total) by mouth 2 (two) times a week. 03/16/22     pregabalin (LYRICA) 50 MG capsule Take 1-2 capsules (50-100 mg total) by mouth at bedtime. 03/09/22     tamsulosin (FLOMAX) 0.4 MG CAPS capsule Take 1 capsule (0.4 mg total) by mouth at bedtime. Patient not taking: Reported on 04/19/2022 01/20/22         Allergies    Ditropan [oxybutynin], Quinine, Vibra-tab [doxycycline], Voltaren [diclofenac], Zoloft [sertraline], Cymbalta [duloxetine hcl], Morphine and related, Oxycontin [oxycodone hcl], Tape, Voltaren [diclofenac sodium], Zohydro er [hydrocodone bitartrate er], Fentanyl, and Neurontin [gabapentin]    Review of Systems   Review of Systems Review of systems Negative for CP, SOB.  A 10 point review of systems was performed and is negative unless otherwise reported in HPI.  Physical Exam Updated Vital Signs BP (!) 145/84   Pulse 68   Temp 97.8 F (36.6 C) (Oral)   Resp 18   Ht '5\' 11"'$  (1.803 m)   Wt 119.3 kg   SpO2 100%   BMI 36.68 kg/m  Physical Exam General: Normal appearing obese male, lying in bed.  HEENT: Sclera anicteric, MMM, trachea midline.  Cardiology: RRR, no murmurs/rubs/gallops. BL radial and DP pulses equal bilaterally.  Resp: Normal respiratory rate and effort. CTAB, no wheezes, rhonchi, crackles.  Abd: Soft, non-tender, non-distended. No rebound tenderness or guarding.  GU: Deferred. MSK: Non pitting edema of RLE up to knee w/ TTP, homan's sign neg. No signs of trauma. Extremities without deformity or TTP. No cyanosis or clubbing. Skin: warm, dry. No rashes or lesions. Neuro: A&Ox4, CNs II-XII grossly intact. MAEs. Sensation grossly intact.  Psych: Normal mood and affect.   ED Results / Procedures / Treatments   Labs (all labs ordered are listed, but only abnormal results are displayed) Labs Reviewed   CBC WITH DIFFERENTIAL/PLATELET - Abnormal; Notable for the following components:      Result Value   Hemoglobin 11.3 (*)    HCT 35.9 (*)    MCV 79.4 (*)    MCH 25.0 (*)    RDW 17.9 (*)    Platelets 514 (*)    Monocytes Absolute 1.2 (*)    All other components within normal limits  BASIC METABOLIC PANEL - Abnormal; Notable for the following components:   CO2 21 (*)    Anion gap 4 (*)    All other components within normal limits  PROTIME-INR - Abnormal; Notable for the following components:   Prothrombin Time 17.6 (*)    INR 1.5 (*)    All other components within normal limits  APTT - Abnormal; Notable for the following components:   aPTT 55 (*)    All other components within normal  limits  URINALYSIS, ROUTINE W REFLEX MICROSCOPIC - Abnormal; Notable for the following components:   APPearance CLOUDY (*)    Glucose, UA >=500 (*)    Hgb urine dipstick LARGE (*)    All other components within normal limits  ACETAMINOPHEN LEVEL - Abnormal; Notable for the following components:   Acetaminophen (Tylenol), Serum <10 (*)    All other components within normal limits  SALICYLATE LEVEL - Abnormal; Notable for the following components:   Salicylate Lvl [id number] (*)    All other components within normal limits  URINALYSIS, MICROSCOPIC (REFLEX) - Abnormal; Notable for the following components:   Bacteria, UA RARE (*)    All other components within normal limits  CBG MONITORING, ED - Abnormal; Notable for the following components:   Glucose-Capillary 61 (*)    All other components within normal limits  HEPARIN LEVEL (UNFRACTIONATED)    EKG None  Radiology US Venous Img Lower Unilateral Right (DVT)  Result Date: 04/21/2022 CLINICAL DATA:  Right lower extremity pain and swelling. EXAM: Right LOWER EXTREMITY VENOUS DOPPLER ULTRASOUND TECHNIQUE: Gray-scale sonography with graded compression, as well as color Doppler and duplex ultrasound were performed to evaluate the lower extremity deep  venous systems from the level of the common femoral vein and including the common femoral, femoral, profunda femoral, popliteal and calf veins including the posterior tibial, peroneal and gastrocnemius veins when visible. The superficial great saphenous vein was also interrogated. Spectral Doppler was utilized to evaluate flow at rest and with distal augmentation maneuvers in the common femoral, femoral and popliteal veins. COMPARISON:  January 23, 2022. FINDINGS: Contralateral Common Femoral Vein: Respiratory phasicity is normal and symmetric with the symptomatic side. No evidence of thrombus. Normal compressibility. Common Femoral Vein: No evidence of thrombus. Normal compressibility, respiratory phasicity and response to augmentation. Saphenofemoral Junction: No evidence of thrombus. Normal compressibility and flow on color Doppler imaging. Profunda Femoral Vein: Noncompressible with no flow consistent with occlusive thrombus. Femoral Vein: Noncompressible with no flow consistent with occlusive thrombus. Popliteal Vein: Noncompressible with no flow consistent with occlusive thrombus. Calf Veins: No evidence of thrombus. Normal compressibility and flow on color Doppler imaging. Superficial Great Saphenous Vein: No evidence of thrombus. Normal compressibility. Venous Reflux:  None. Other Findings: 1.2 cm enlarged lymph node is noted in right groin region with fatty hilum. IMPRESSION: Occlusive deep venous thrombosis is noted in the right profundus femoral, superficial femoral and popliteal veins. 1.2 cm enlarged lymph node is noted in right groin region which most likely is reactive or inflammatory in etiology. Follow-up ultrasound in 2-3 months is recommended to ensure resolution or stability. Electronically Signed   By: Marijo Conception M.D.   On: 04/21/2022 15:44    Procedures Procedures  {Document cardiac monitor, telemetry assessment procedure when appropriate:1}  Medications Ordered in ED Medications   heparin ADULT infusion 100 units/mL (25000 units/236m) (1,700 Units/hr Intravenous New Bag/Given 04/21/22 1844)  heparin bolus via infusion 3,500 Units (3,500 Units Intravenous Bolus from Bag 04/21/22 1848)    ED Course/ Medical Decision Making/ A&P                          Medical Decision Making Amount and/or Complexity of Data Reviewed Labs: ordered.  Risk Prescription drug management. Decision regarding hospitalization.    This patient presents to the ED for concern of recurrent RLE DVT; this involves an extensive number of treatment options, and is a complaint that carries with it a high risk  of complications and morbidity.  I considered the following differential and admission for this acute, potentially life threatening condition.   MDM:    Pt with DVT US of RLE today positive for occlusive thrombus in deep femoral, superficial femoral, and popliteal veins. He has pain but intact peripheral pulses, tolerable now, overall well-appearing patient. ***   Patient states that he takes 6 extra take Tylenol nightly for his pain control.  This is 3 g of Tylenol every evening.  This is the allotted daily allowance and he states he only takes it 1 time per day.  Unlikely that this would represent a Tylenol overdose for this patient but will obtain a Tylenol level as well as a salicylate level to be sure.  I counseled patient on the dangers of Tylenol overdose patient reports understanding.  Clinical Course as of 04/21/22 1921  Ludwig Clarks Apr 21, 2022  1742 Will consult to hospitalist and vascular surgery. Ordered heparin per pharmacy consult, appreciate pharmacy assistance. [HN]    Clinical Course User Index [HN] Audley Hose, MD    Labs: I Ordered, and personally interpreted labs.  The pertinent results include:  ***  Imaging Studies ordered: I ordered imaging studies including *** I independently visualized and interpreted imaging. I agree with the radiologist  interpretation  Additional history obtained from ***.  External records from outside source obtained and reviewed including ***  Cardiac Monitoring: .The patient was maintained on a cardiac monitor.  I personally viewed and interpreted the cardiac monitored which showed an underlying rhythm of: ***  Reevaluation: After the interventions noted above, I reevaluated the patient and found that they have :{resolved/improved/worsened:23923::"improved"}  Social Determinants of Health: .***  Disposition:  ***  Co morbidities that complicate the patient evaluation . Past Medical History:  Diagnosis Date  . Anxiety   . Arthritis   . Bilateral leg cramps    takes magnesium  . BPH (benign prostatic hyperplasia)   . DDD (degenerative disc disease), lumbar    gets back injections   . Deafness in right ear    meniere's disease  . Depression   . Dermatitis    bilateral hands  . 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 Factor V Leiden mutation ([place])    "never had blood clots"  . GERD (gastroesophageal reflux disease)   . Hiatal hernia   . History of kidney stones    multiple kidney stones-not a problem at present  . 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 History of panic attacks   . 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 Hypertension   . Incomplete right bundle branch block   . Insulin dependent type 2 diabetes mellitus, uncontrolled followed by dr [name] ([facility])   dx 2007--- ltype 1 now since jan 2017 surgery with part of pancreas removed  . Lower urinary tract symptoms (LUTS)   . Meniere's disease dx 1970s   intermittant vertigo  . Obstructive sleep apnea    " i used to have sleep apnea" never used a c-pap   . Open wound of  abdomen    WET/DRY DRESSING CHANGES TID--- POST ABD. SURGERY 09-21-2016 healed now  . Peripheral vascular disease ([place])  right leg - decreased pulse   . Pleural effusion   . Wears partial dentures    LOWER  . 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 heparin bolus via infusion 3,500 Units  . heparin ADULT infusion 100 units/mL (25000 units/223m)    I have reviewed the patients home medicines and have made adjustments as needed  Problem List / ED Course: Problem List Items Addressed This Visit       Cardiovascular and Mediastinum   Deep venous thrombosis (HCC) - Primary   Relevant Medications   heparin ADULT infusion 100 units/mL (25000 units/2539m         {Document critical care time when appropriate:1} {Document review of labs and clinical decision tools ie heart score, Chads2Vasc2 etc:1}  {Document your independent review of radiology images, and any outside records:1} {Document your discussion with family members, caretakers, and with consultants:1} {Document social determinants of health affecting pt's care:1} {Document your decision making why or why not admission, treatments were needed:1}  This note was created using dictation software, which may contain spelling or grammatical errors.

## 2022-04-21 NOTE — H&P (Signed)
History and Physical    Patient: Derek [name] [id number] DOB: 12-25-1945 DOA: 04/21/2022 DOS: the patient was seen and examined on 04/21/2022 PCP: [name], MD  Patient coming from: Home  Chief Complaint:  Chief Complaint  Patient presents with   Leg Pain   HPI: [name] is a 77 y.o. 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  Has IVC filter.  Recently I admitted pt in Aug after he got a DVT after being mistakenly taken off of eliquis for a short period of time.  Put back on eliquis but DVT continued to worsen despite eliquis.  Pt underwent Thrombectomy with Dr. Maryelizabeth Kaufmann.  Discharged initially on lovenox before this was switched to pradaxa which he has remained on for past several months.  Initially did well post thrombectomy for many months until recently he began to have increased RLE swelling.  Saw Dr. [name] on 2/28 who ordered a repeat LE [place] which was done earlier today.  Unfortunately repeat LE [place] confirms extensive recurrent DVT.  Pt sent in to ED.  Also having hematuria recently.  Review of Systems: As mentioned in the history of present illness. All other systems reviewed and are negative. 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" no pulmonary md, sees dr [name]   Factor V Leiden mutation ([place])    "never had blood clots"   GERD (gastroesophageal reflux disease)    Hiatal hernia    History of kidney stones    multiple kidney stones-not a problem at present   History of pancreatic cancer    01/ 2017  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  and splenectomy (per path islet cell, pT1 pNX) no further treatment   History of panic attacks    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   Past Surgical History:  Procedure Laterality Date   ANAL FISTULECTOMY N/A 04/01/2013   Procedure: EXAM UNDER ANESTHESIA WITH ANAL FISSURectomy, sphincterotomy and internal hemorrhoidectomy;  Surgeon: Harl Bowie, MD;  Location: [place];  Service: General;  Laterality: N/A;   APPLICATION OF WOUND VAC N/A 05/16/2015  Procedure: APPLICATION OF WOUND VAC;  Surgeon: Armandina Gemma, MD;  [facility];  Service: General;  Laterality: N/A;   APPLICATION OF WOUND VAC N/A 05/20/2015   Procedure: EXHANGE OF ABDOMINAL  WOUND VAC DRESSING;  Surgeon: Armandina Gemma, MD;  Location: [facility];  Service: General;  Laterality: N/A;   COLONOSCOPY     CYSTOSCOPY N/A 05/23/2021   Procedure: Consuela Mimes;  Surgeon: Franchot Gallo, MD;  Location: [facility];  Service: Urology;  Laterality: N/A;   CYSTOSCOPY WITH INSERTION OF UROLIFT N/A 10/12/2016   Procedure: CYSTOSCOPY WITH INSERTION OF UROLIFT;  Surgeon: Franchot Gallo, MD;  Location: [facility];  Service: Urology;  Laterality: N/A;   EUS N/A 12/31/2014   Procedure: UPPER  ENDOSCOPIC ULTRASOUND (EUS) LINEAR;  Surgeon: Milus Banister, MD;  [facility];  Service: Endoscopy;  Laterality: N/A;   HARDWARE REMOVAL Left 2013   left ankle   HEMORRHOIDECTOMY WITH HEMORRHOID BANDING     INCISION AND DRAINAGE ABSCESS N/A 05/14/2015   Procedure: DRAINAGE OF INFECTED PANCREATIC PSEUDOCYST;  Surgeon: Armandina Gemma, MD;  [facility];  Service: General;  Laterality: N/A;   INCISION AND DRAINAGE OF WOUND N/A 05/16/2015   Procedure: IRRIGATION AND DEBRIDEMENT WOUND;  Surgeon: Armandina Gemma, MD;  [facility];  Service: General;  Laterality: N/A;   INCISIONAL HERNIA REPAIR N/A 11/12/2015   Procedure: REPAIR INCISIONAL HERNIA WITH MESH;  Surgeon: Armandina Gemma, MD;  Location: [place];  Service: General;  Laterality: N/A;   INGUINAL HERNIA REPAIR Right 1974;  [place] N/A 11/12/2015   Procedure: INSERTION OF MESH;  Surgeon: Armandina Gemma, MD;  Location: [place];  Service: General;  Laterality: N/A;   IR GENERIC HISTORICAL  04/20/2015   IR RADIOLOGIST EVAL & MGMT 04/20/2015 [facility] INTERV RAD   IR IVC FILTER PLMT / S&I /IMG GUID/MOD SED  11/25/2020   IR RADIOLOGIST EVAL & MGMT  10/26/2021   IR RADIOLOGIST EVAL & MGMT  01/26/2022   IR RADIOLOGY PERIPHERAL GUIDED IV START  11/25/2020   IR THROMBECT VENO MECH MOD SED  10/08/2021   IR US GUIDE VASC ACCESS RIGHT  11/25/2020   IR US GUIDE VASC ACCESS RIGHT  10/08/2021   IR VENO/EXT/UNI RIGHT  10/12/2021   KNEE ARTHROSCOPY Bilateral right 08-08-2000;  left 11-23-2000   meniscal repair and chondroplasty's   KNEE ARTHROSCOPY  03/01/2012   Procedure: ARTHROSCOPY KNEE;  Surgeon: Gearlean Alf, MD;  Location: [facility];  Service: Orthopedics;  Laterality: Left;  WITH SYNOVECTOMY    LAPAROTOMY N/A 05/14/2015   Procedure: EXPLORATORY LAPAROTOMY;  Surgeon: Armandina Gemma, MD;  [facility];  Service: General;  Laterality: N/A;   MASS EXCISION N/A 12/24/2019   Procedure: EXCISION LIP CARCINOMA;  Surgeon: Leta Baptist,  MD;  Location: [place];  Service: ENT;  Laterality: N/A;   ORIF LEFT ANKLE FX  08/20/2009   distal tib-fib and malleolus   ROTATOR CUFF REPAIR Bilateral right 1994; left 1993, left 1993   TOTAL KNEE ARTHROPLASTY Right 06/02/2013   Procedure: RIGHT TOTAL KNEE ARTHROPLASTY;  Surgeon: Gearlean Alf, MD;  [facility];  Service: Orthopedics;  Laterality: Right;   TOTAL KNEE ARTHROPLASTY Left 01-31-2008   dr Wynelle Link   TOTAL KNEE REVISION Right 01/23/2018   Procedure: right knee polyethylene revision;  Surgeon: Gaynelle Arabian, MD;  [facility];  Service: Orthopedics;  Laterality: Right;  19mn   TRANSURETHRAL RESECTION OF BLADDER TUMOR N/A 05/23/2021  Procedure: TRANSURETHRAL BIOPSY OF BLADDER;  Surgeon: Franchot Gallo, MD;  Location: [facility];  Service: Urology;  Laterality: N/A;   TRANSURETHRAL RESECTION OF PROSTATE N/A 04/21/2019   Procedure: TRANSURETHRAL RESECTION OF THE PROSTATE (TURP);  Surgeon: Franchot Gallo, MD;  Location: [facility];  Service: Urology;  Laterality: N/A;   UMBILICAL HERNIA REPAIR  08-11-1999   dr Ninfa Linden   UPPER GASTROINTESTINAL ENDOSCOPY  sept 2016   WOUND EXPLORATION N/A 09/21/2016   Procedure: WOUND EXPLORATION AND DEBRIDEMENT ABDOMINAL WALL;  Surgeon: Armandina Gemma, MD;  [facility];  Service: General;  Laterality: N/A;   Social History:  reports that he quit smoking about 47 years ago. His smoking use included cigarettes. He has a 68.00 pack-year smoking history. He has never used smokeless tobacco. He reports that he does not drink alcohol and does not use drugs.  Allergies  Allergen Reactions   Ditropan [Oxybutynin] Swelling and Other (See Comments)   Quinine Other (See Comments)    Platelets dropped- consumptive coagulopathy     Vibra-Tab [Doxycycline] Other (See Comments)    Epistaxis     Voltaren [Diclofenac] Swelling and Other (See Comments)    Elevated liver enzymes Lip swelling    Zoloft [Sertraline]  Other (See Comments)    Insomnia Acute urinary retention   Cymbalta [Duloxetine Hcl] Other (See Comments)    Night sweats   Morphine And Related Nausea And Vomiting, Swelling and Other (See Comments)    Facial and lip swelling OK to use IR form of hydrocodone and oxycodone   Oxycontin [Oxycodone Hcl] Swelling and Other (See Comments)    Lip swelling OK to use IR form   Tape Other (See Comments)    Skin tears easily  SKIN IS FRAGILE- CERTAIN BANDAGES TEAR OFF THE SKIN, "BANDAIDS ARE OKAY."   Voltaren [Diclofenac Sodium] Other (See Comments)    Elevated liver enzymes   Zohydro Er [Hydrocodone Bitartrate Er] Swelling    Facial swelling OK to use IR form   Fentanyl Nausea And Vomiting and Other (See Comments)    Nausea, vomiting ad headaches   Neurontin [Gabapentin] Other (See Comments)    Unknown reaction     Family History  Problem Relation Age of Onset   Bone cancer Father        Jaw   Heart disease Paternal Uncle    Diabetes Maternal Grandmother    Factor V Leiden deficiency Daughter    Colon cancer Neg Hx    Esophageal cancer Neg Hx    Stomach cancer Neg Hx    Rectal cancer Neg Hx    Migraines Neg Hx     Prior to Admission medications   Medication Sig Start Date End Date Taking? Authorizing Provider  acetaminophen (TYLENOL) 500 MG tablet Take 500 mg by mouth every 6 (six) hours as needed for mild pain or headache.   Yes [provider]  aspirin EC 81 MG tablet Take 81 mg by mouth daily. Swallow whole.   Yes [provider]  carvedilol (COREG) 25 MG tablet Take 1 tablet by mouth once a day every evening 07/08/21  Yes   dabigatran (PRADAXA) 150 MG CAPS capsule Take 1 capsule (150 mg total) by mouth 2 (two) times daily. 04/19/22  Yes Ennever, Rudell Cobb, MD  diphenhydramine-acetaminophen (TYLENOL PM) 25-500 MG TABS tablet Take 1 tablet by mouth at bedtime.   Yes [provider]  docusate sodium (COLACE) 100 MG capsule Take 100 mg by mouth 2 (two)  times daily.   Yes [provider]  Dulaglutide (TRULICITY) 4.5 0000000 SOPN Inject 4.5 mg into the skin once a week. 03/10/22  Yes   empagliflozin (JARDIANCE) 25 MG TABS tablet Take 1 tablet (25 mg total) by mouth daily. 03/10/22  Yes   fluticasone (FLONASE) 50 MCG/ACT nasal spray Place 2 sprays into both nostrils daily. Patient taking differently: Place 1-2 sprays into both nostrils daily as needed for allergies. 02/21/22  Yes   furosemide (LASIX) 20 MG tablet Take 20 mg by mouth daily as needed for edema.   Yes [provider]  Insulin Degludec FlexTouch 100 UNIT/ML SOPN Inject 70 Units into the skin daily. 03/10/22  Yes   lansoprazole (PREVACID) 30 MG capsule Take 1 capsule (30 mg total) by mouth daily before a meal. 04/05/22  Yes   levocetirizine (XYZAL) 5 MG tablet Take 5 mg by mouth every evening.   Yes [provider]  losartan (COZAAR) 50 MG tablet Take 1 tablet by mouth once every evening 07/08/21  Yes   magnesium oxide (MAG-OX) 400 MG tablet Take 800 mg by mouth at bedtime.   Yes [provider]  nystatin (MYCOSTATIN) 100000 UNIT/ML suspension Use 4-5 mL for Mouth/Throat as directed four times a day Patient taking differently: Take 5 mLs by mouth 4 (four) times daily as needed (thrush). 07/08/21  Yes   ondansetron (ZOFRAN-ODT) 4 MG disintegrating tablet Take 1 tablet (4 mg total) by mouth every 8 (eight) hours as needed for nausea or vomiting. 10/10/21  Yes Amin, Ankit Chirag, MD  pravastatin (PRAVACHOL) 20 MG tablet Take 1 tablet (20 mg total) by mouth 2 (two) times a week. Patient taking differently: Take 20 mg by mouth 2 (two) times a week. Take on Monday and Friday Evenings 03/16/22  Yes   pregabalin (LYRICA) 50 MG capsule Take 1-2 capsules (50-100 mg total) by mouth at bedtime. Patient taking differently: Take 50 mg by mouth at bedtime. 03/09/22  Yes     Physical Exam: Vitals:   04/21/22 1814 04/21/22 1826 04/21/22 1827 04/21/22 2235  BP:    (!) 149/93   Pulse: 66 74 69 69  Resp:    20  Temp:    98.1 F (36.7 C)  TempSrc:    Oral  SpO2: 100% 100% 100% 100%  Weight:      Height:       Constitutional: NAD, calm, comfortable Respiratory: clear to auscultation bilaterally, no wheezing, no crackles. Normal respiratory effort. No accessory muscle use.  Cardiovascular: Regular rate and rhythm, no murmurs / rubs / gallops.  B DP pulses present Abdomen: no tenderness, no masses palpated. No hepatosplenomegaly. Bowel sounds positive.  Musculoskeletal: RLE with non pitting edema from foot to knee. Neurologic: CN 2-12 grossly intact. Sensation intact, DTR normal. Strength 5/5 in all 4.  Psychiatric: Normal judgment and insight. Alert and oriented x 3. Normal mood.   Data Reviewed: {Tip this will not be part of the note when signed- Document your independent interpretation of telemetry tracing, EKG, lab, Radiology test or any other diagnostic tests. Add any new diagnostic test ordered today. (Optional):[id number]}       Latest Ref Rng & Units 04/21/2022    5:15 PM 04/19/2022    3:10 PM 12/29/2021    2:55 PM  CBC  WBC 4.0 - 10.5 K/uL 7.3  7.2  9.1   Hemoglobin 13.0 - 17.0 g/dL 11.3  12.2  12.9   Hematocrit 39.0 - 52.0 % 35.9  39.5  41.1  Platelets 150 - 400 K/uL 514  380  492       Latest Ref Rng & Units 04/21/2022    5:15 PM 04/19/2022    3:10 PM 12/29/2021    2:55 PM  CMP  Glucose 70 - 99 mg/dL 82  135  160   BUN 8 - 23 mg/dL '16  19  23   '$ Creatinine 0.61 - 1.24 mg/dL 0.85  0.96  1.19   Sodium 135 - 145 mmol/L 136  138  138   Potassium 3.5 - 5.1 mmol/L 3.9  3.9  4.7   Chloride 98 - 111 mmol/L 111  109  105   CO2 22 - 32 mmol/L '21  21  26   '$ Calcium 8.9 - 10.3 mg/dL 9.4  10.6  10.8   Total Protein 6.5 - 8.1 g/dL  6.8  7.2   Total Bilirubin 0.3 - 1.2 mg/dL  0.4  0.3   Alkaline Phos 38 - 126 U/L  73  75   AST 15 - 41 U/L  11  14   ALT 0 - 44 U/L  10  16    LE [place]: IMPRESSION: Occlusive deep venous thrombosis is noted in the right  profundus femoral, superficial femoral and popliteal veins.   1.2 cm enlarged lymph node is noted in right groin region which most likely is reactive or inflammatory in etiology. Follow-up ultrasound in 2-3 months is recommended to ensure resolution or stability.  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 Now recurrent DVT despite pradaxa (failed pradaxa). Heparin gtt for the moment Dr. [name] consult for AM re: what to put him on for [facility] longer term Per Dr. Maryelizabeth Kaufmann (who also did intervention last time): "Good evening. Let's try Hep gtt. I will be at [facility] on Monday and can evaluate him for thrombectomy then. I suspect the same as Dr Alcario Drought that [facility] may not be enough but we need to give it a try. Please have him NPO after MN Sunday night for potential intervention on Monday."  Chronic back pain Cont Lyrica No longer on long term opiates, had bad experience with them.  Benign essential HTN Cont home BP meds  DM2 (diabetes mellitus, type 2) (HCC) Resistant scale SSI AC/HS Cont Trulicity Cont Jardiance Cont degludec at slightly reduced dose of 50u daily.  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      Advance Care Planning:   Code Status: Full Code  Consults: Dr. Maryelizabeth Kaufmann, also sent secure chat to Dr. [name]  Family Communication: ***  Severity of Illness: The appropriate patient status for this patient is OBSERVATION. Observation status is judged to be reasonable and necessary in order to provide the required intensity of service to ensure the patient's safety. The patient's presenting symptoms, physical exam findings, and initial radiographic and laboratory data in the context of their medical condition  is felt to place them at decreased risk for further clinical deterioration. Furthermore, it is anticipated that the patient will be medically stable for discharge from the hospital within 2 midnights of admission.   Author: Etta Quill., DO 04/21/2022 11:40 PM  For on call review [email or web address].

## 2022-04-21 NOTE — Assessment & Plan Note (Signed)
Long term remission since 2017.

## 2022-04-21 NOTE — Assessment & Plan Note (Addendum)
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 Now recurrent DVT despite pradaxa (failed pradaxa). Heparin gtt for the moment Dr. [name] consult for AM re: what to put him on for [facility] longer term Per Dr. Maryelizabeth Kaufmann (who also did intervention last time): "Good evening. Let's try Hep gtt. I will be at [facility] on Monday and can evaluate him for thrombectomy then. I suspect the same as Dr Alcario Drought that [facility] may not be enough but we need to give it a try. Please have him NPO after MN Sunday night for potential intervention on Monday." Needs repeat US in 2-3 months to make sure the 1.2cm lymph node in R groin has improved.

## 2022-04-21 NOTE — Progress Notes (Signed)
ANTICOAGULATION CONSULT NOTE - Initial Consult  Pharmacy Consult for heparin Indication: DVT  Allergies  Allergen Reactions   Ditropan [Oxybutynin] Swelling and Other (See Comments)   Quinine Other (See Comments)    Platelets dropped- consumptive coagulopathy     Vibra-Tab [Doxycycline] Other (See Comments)    Epistaxis     Voltaren [Diclofenac] Swelling and Other (See Comments)    Elevated liver enzymes Lip swelling    Zoloft [Sertraline] Other (See Comments)    Insomnia Acute urinary retention   Cymbalta [Duloxetine Hcl] Other (See Comments)    Night sweats   Morphine And Related Nausea And Vomiting, Swelling and Other (See Comments)    Facial and lip swelling OK to use IR form of hydrocodone and oxycodone   Oxycontin [Oxycodone Hcl] Swelling and Other (See Comments)    Lip swelling OK to use IR form   Tape Other (See Comments)    Skin tears easily  SKIN IS FRAGILE- CERTAIN BANDAGES TEAR OFF THE SKIN, "BANDAIDS ARE OKAY."   Voltaren [Diclofenac Sodium] Other (See Comments)    Elevated liver enzymes   Zohydro Er [Hydrocodone Bitartrate Er] Swelling    Facial swelling OK to use IR form   Fentanyl Nausea And Vomiting and Other (See Comments)    Nausea, vomiting ad headaches   Neurontin [Gabapentin] Other (See Comments)    Unknown reaction     Patient Measurements: Height: '5\' 11"'$  (180.3 cm) Weight: 119.3 kg (263 lb) IBW/kg (Calculated) : 75.3 Heparin Dosing Weight: 101kg  Vital Signs: Temp: 97.8 F (36.6 C) (03/01 1624) Temp Source: Oral (03/01 1624) BP: 145/87 (03/01 1624) Pulse Rate: 75 (03/01 1624)  Labs: Recent Labs    04/19/22 1510 04/21/22 1715  HGB 12.2* 11.3*  HCT 39.5 35.9*  PLT 380 514*  APTT  --  55*  LABPROT  --  17.6*  INR  --  1.5*  CREATININE 0.96 0.85    Estimated Creatinine Clearance: 95.6 mL/min (by C-G formula based on SCr of 0.85 mg/dL).   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" no pulmonary md, sees dr [name]   Factor V Leiden mutation ([place])    "never had blood clots"   GERD (gastroesophageal reflux disease)    Hiatal hernia    History of kidney stones    multiple kidney stones-not a problem at present   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, pT1 pNX) no further treatment   History of panic attacks    History of traumatic head injury    age 77-- "coma for a month"--- no residual   Hypertension    Incomplete right bundle branch block    Insulin dependent type 2 diabetes mellitus, uncontrolled followed by dr [name] ([facility])   dx 2007--- ltype 1 now since jan 2017 surgery with part of pancreas removed   Lower urinary tract symptoms (LUTS)    Meniere's disease dx 1970s   intermittant vertigo  Obstructive sleep apnea    " i used to have sleep apnea" never used a c-pap    Open wound of abdomen    WET/DRY DRESSING CHANGES TID--- POST ABD. 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  [place] with DVT  Goal of Therapy:  Heparin level 0.3-0.7 units/ml Monitor platelets by anticoagulation protocol: Yes   Plan:  Heparin 3500 units IV x 1, and gtt at 1700 units/hr F/u 8 hour heparin level F/u long term [facility] plan  Bertis Ruddy, PharmD, [place] Pharmacist ED Pharmacist  Phone # [phone] 04/21/2022 6:15 PM

## 2022-04-21 NOTE — ED Notes (Signed)
[facility] here to transport pt via stretcher, report given at the bedside. IP nurse updated about pt arrival.

## 2022-04-21 NOTE — ED Notes (Signed)
Pt requested Ice cream for low blood sugar

## 2022-04-21 NOTE — Assessment & Plan Note (Signed)
Resistant scale SSI AC/HS Cont Trulicity Cont Jardiance Cont degludec at slightly reduced dose of 50u daily.

## 2022-04-21 NOTE — ED Triage Notes (Signed)
Pt reports he just had [place] of right leg and they told him that he has a blood clot.

## 2022-04-21 NOTE — ED Notes (Signed)
Report given to Ellen Henri, RN at [facility] via telephone

## 2022-04-21 NOTE — Assessment & Plan Note (Signed)
Lyrica didn't work so not taking this any more. Muscle relaxants no good. No longer on long term opiates, had bad experience with them. Will try a 1 time of toradol tonight to see if any help. 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

## 2022-04-21 NOTE — Assessment & Plan Note (Signed)
Cont home BP meds ?

## 2022-04-21 NOTE — ED Notes (Signed)
[facility] called for transport. 

## 2022-04-22 ENCOUNTER — Observation Stay (HOSPITAL_COMMUNITY): Payer: PPO

## 2022-04-22 DIAGNOSIS — Z7901 Long term (current) use of anticoagulants: Secondary | ICD-10-CM

## 2022-04-22 DIAGNOSIS — E785 Hyperlipidemia, unspecified: Secondary | ICD-10-CM | POA: Diagnosis present

## 2022-04-22 DIAGNOSIS — D6859 Other primary thrombophilia: Secondary | ICD-10-CM

## 2022-04-22 DIAGNOSIS — R319 Hematuria, unspecified: Secondary | ICD-10-CM | POA: Diagnosis present

## 2022-04-22 DIAGNOSIS — M5136 Other intervertebral disc degeneration, lumbar region: Secondary | ICD-10-CM | POA: Diagnosis present

## 2022-04-22 DIAGNOSIS — Z95828 Presence of other vascular implants and grafts: Secondary | ICD-10-CM | POA: Diagnosis not present

## 2022-04-22 DIAGNOSIS — I82411 Acute embolism and thrombosis of right femoral vein: Secondary | ICD-10-CM | POA: Diagnosis present

## 2022-04-22 DIAGNOSIS — M549 Dorsalgia, unspecified: Secondary | ICD-10-CM | POA: Diagnosis present

## 2022-04-22 DIAGNOSIS — K219 Gastro-esophageal reflux disease without esophagitis: Secondary | ICD-10-CM | POA: Diagnosis present

## 2022-04-22 DIAGNOSIS — Z86718 Personal history of other venous thrombosis and embolism: Secondary | ICD-10-CM | POA: Diagnosis not present

## 2022-04-22 DIAGNOSIS — M7989 Other specified soft tissue disorders: Secondary | ICD-10-CM | POA: Diagnosis present

## 2022-04-22 DIAGNOSIS — Z8507 Personal history of malignant neoplasm of pancreas: Secondary | ICD-10-CM

## 2022-04-22 DIAGNOSIS — D6851 Activated protein C resistance: Secondary | ICD-10-CM | POA: Diagnosis present

## 2022-04-22 DIAGNOSIS — I824Y1 Acute embolism and thrombosis of unspecified deep veins of right proximal lower extremity: Secondary | ICD-10-CM | POA: Diagnosis present

## 2022-04-22 DIAGNOSIS — I82401 Acute embolism and thrombosis of unspecified deep veins of right lower extremity: Secondary | ICD-10-CM | POA: Diagnosis not present

## 2022-04-22 DIAGNOSIS — G8929 Other chronic pain: Secondary | ICD-10-CM | POA: Diagnosis present

## 2022-04-22 DIAGNOSIS — I10 Essential (primary) hypertension: Secondary | ICD-10-CM | POA: Diagnosis present

## 2022-04-22 DIAGNOSIS — Z794 Long term (current) use of insulin: Secondary | ICD-10-CM | POA: Diagnosis not present

## 2022-04-22 DIAGNOSIS — M1990 Unspecified osteoarthritis, unspecified site: Secondary | ICD-10-CM | POA: Diagnosis present

## 2022-04-22 DIAGNOSIS — Z87891 Personal history of nicotine dependence: Secondary | ICD-10-CM | POA: Diagnosis not present

## 2022-04-22 DIAGNOSIS — E669 Obesity, unspecified: Secondary | ICD-10-CM | POA: Diagnosis present

## 2022-04-22 DIAGNOSIS — R5383 Other fatigue: Secondary | ICD-10-CM | POA: Diagnosis not present

## 2022-04-22 DIAGNOSIS — Z87442 Personal history of urinary calculi: Secondary | ICD-10-CM | POA: Diagnosis not present

## 2022-04-22 DIAGNOSIS — Z96653 Presence of artificial knee joint, bilateral: Secondary | ICD-10-CM | POA: Diagnosis present

## 2022-04-22 DIAGNOSIS — N400 Enlarged prostate without lower urinary tract symptoms: Secondary | ICD-10-CM | POA: Diagnosis present

## 2022-04-22 DIAGNOSIS — D75839 Thrombocytosis, unspecified: Secondary | ICD-10-CM | POA: Diagnosis present

## 2022-04-22 DIAGNOSIS — H9191 Unspecified hearing loss, right ear: Secondary | ICD-10-CM | POA: Diagnosis present

## 2022-04-22 DIAGNOSIS — E611 Iron deficiency: Secondary | ICD-10-CM | POA: Diagnosis present

## 2022-04-22 DIAGNOSIS — F32A Depression, unspecified: Secondary | ICD-10-CM | POA: Diagnosis present

## 2022-04-22 LAB — BASIC METABOLIC PANEL
Anion gap: 7 (ref 5–15)
BUN: 16 mg/dL (ref 8–23)
CO2: 22 mmol/L (ref 22–32)
Calcium: 9.9 mg/dL (ref 8.9–10.3)
Chloride: 110 mmol/L (ref 98–111)
Creatinine, Ser: 0.87 mg/dL (ref 0.61–1.24)
GFR, Estimated: >60 mL/min (ref >60)
Glucose, Bld: 106 mg/dL — ABNORMAL HIGH (ref 70–99)
Potassium: 3.6 mmol/L (ref 3.5–5.1)
Sodium: 139 mmol/L (ref 135–145)

## 2022-04-22 LAB — CBC
HCT: 36.4 % — ABNORMAL LOW (ref 39.0–52.0)
Hemoglobin: 11.2 g/dL — ABNORMAL LOW (ref 13.0–17.0)
MCH: 25.2 pg — ABNORMAL LOW (ref 26.0–34.0)
MCHC: 30.8 g/dL (ref 30.0–36.0)
MCV: 82 fL (ref 80.0–100.0)
Platelets: 514 10*3/uL — ABNORMAL HIGH (ref 150–400)
RBC: 4.44 MIL/uL (ref 4.22–5.81)
RDW: 17.9 % — ABNORMAL HIGH (ref 11.5–15.5)
WBC: 9.2 10*3/uL (ref 4.0–10.5)
nRBC: 0.2 % (ref 0.0–0.2)

## 2022-04-22 LAB — HEPARIN LEVEL (UNFRACTIONATED)
Heparin Unfractionated: 0.11 IU/mL — ABNORMAL LOW (ref 0.30–0.70)
Heparin Unfractionated: 0.53 IU/mL (ref 0.30–0.70)
Heparin Unfractionated: 0.49 IU/mL (ref 0.30–0.70)

## 2022-04-22 LAB — GLUCOSE, CAPILLARY
Glucose-Capillary: 119 mg/dL — ABNORMAL HIGH (ref 70–99)
Glucose-Capillary: 167 mg/dL — ABNORMAL HIGH (ref 70–99)
Glucose-Capillary: 171 mg/dL — ABNORMAL HIGH (ref 70–99)
Glucose-Capillary: 76 mg/dL (ref 70–99)
Glucose-Capillary: 98 mg/dL (ref 70–99)

## 2022-04-22 MED ORDER — IOHEXOL 350 MG/ML SOLN
125.0000 mL | Freq: Once | INTRAVENOUS | Status: AC | PRN
  Administered 2022-04-22: 125 mL via INTRAVENOUS

## 2022-04-22 MED ORDER — KETOROLAC TROMETHAMINE 30 MG/ML IJ SOLN
30.0000 mg | Freq: Once | INTRAMUSCULAR | Status: AC
  Administered 2022-04-22: 30 mg via INTRAVENOUS
  Filled 2022-04-22: qty 1

## 2022-04-22 MED ORDER — LIDOCAINE 5 % EX PTCH
1.0000 | MEDICATED_PATCH | CUTANEOUS | Status: DC
  Administered 2022-04-22: 1 via TRANSDERMAL
  Filled 2022-04-22 (×4): qty 1

## 2022-04-22 MED ORDER — SODIUM CHLORIDE 0.9 % IV SOLN
510.0000 mg | Freq: Once | INTRAVENOUS | Status: AC
  Administered 2022-04-22: 510 mg via INTRAVENOUS
  Filled 2022-04-22: qty 510

## 2022-04-22 NOTE — Consult Note (Signed)
Chief Complaint: DVT  Referring Physician(s): Jennette Kettle  Supervising Physician: Michaelle Birks  Patient Status: [facility] - In-pt  History of Present Illness: Derek [name]. is a 77 y.o. 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    He has an IVC filter which was placed by Dr. Laurence Ferrari back on 11/25/2020.  He is well known to our service for previous thrombectomy back in August after he developed a DVT due to being mistakenly taken off of eliquis for a short period of time.    He was discharged initially on Lovenox then was switched to Pradaxa which he has remained on for past several months.   Initially did well post thrombectomy for many months until recently he began to have increased RLE swelling.   Saw Dr. [name] on 2/28 who ordered a repeat LE [place] which showed recurrent DVT.    We are asked to evaluate him for mechanical thrombectomy.  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" no pulmonary md, sees dr [name]   Factor V Leiden mutation ([place])    "never had blood clots"   GERD (gastroesophageal reflux disease)    Hiatal hernia    History of kidney stones    multiple kidney stones-not a problem at present   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, pT1 pNX) no further treatment   History of panic attacks    History 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    Past Surgical History:  Procedure Laterality Date   ANAL FISTULECTOMY N/A 04/01/2013   Procedure: EXAM UNDER ANESTHESIA WITH ANAL FISSURectomy, sphincterotomy and internal hemorrhoidectomy;  Surgeon: Harl Bowie, MD;  Location: [place];  Service: General;  Laterality: N/A;   APPLICATION OF WOUND VAC N/A 05/16/2015   Procedure: APPLICATION OF WOUND VAC;  Surgeon: Armandina Gemma, MD;  Location[facility];  Service: General;  Laterality: N/A;   APPLICATION OF WOUND VAC N/A 05/20/2015   Procedure: EXHANGE OF ABDOMINAL  WOUND VAC DRESSING;  Surgeon: Armandina Gemma, MD;  Location: [facility];  Service: General;  Laterality: N/A;   COLONOSCOPY     CYSTOSCOPY N/A 05/23/2021   Procedure: CYSTOSCOPY;  Surgeon: Franchot Gallo, MD;  Location: [facility];  Service: Urology;  Laterality: N/A;   CYSTOSCOPY WITH INSERTION OF UROLIFT N/A 10/12/2016   Procedure: CYSTOSCOPY WITH INSERTION OF UROLIFT;  Surgeon: Franchot Gallo, MD;  Location: [facility];  Service: Urology;  Laterality: N/A;   EUS N/A 12/31/2014   Procedure: UPPER ENDOSCOPIC ULTRASOUND (EUS) LINEAR;  Surgeon: Milus Banister, MD;  [facility];  Service: Endoscopy;  Laterality: N/A;   HARDWARE REMOVAL Left 2013   left ankle   HEMORRHOIDECTOMY WITH HEMORRHOID BANDING     INCISION AND DRAINAGE ABSCESS N/A 05/14/2015    Procedure: DRAINAGE OF INFECTED PANCREATIC PSEUDOCYST;  Surgeon: Armandina Gemma, MD;  [facility];  Service: General;  Laterality: N/A;   INCISION AND DRAINAGE OF WOUND N/A 05/16/2015   Procedure: IRRIGATION AND DEBRIDEMENT WOUND;  Surgeon: Armandina Gemma, MD;  [facility];  Service: General;  Laterality: N/A;   INCISIONAL HERNIA REPAIR N/A 11/12/2015   Procedure: REPAIR INCISIONAL HERNIA WITH MESH;  Surgeon: Armandina Gemma, MD;  Location: [place];  Service: General;  Laterality: N/A;   INGUINAL HERNIA REPAIR Right 1974;  [place] N/A 11/12/2015   Procedure: INSERTION OF MESH;  Surgeon: Armandina Gemma, MD;  Location: [place];  Service: General;  Laterality: N/A;   IR GENERIC HISTORICAL  04/20/2015   IR RADIOLOGIST EVAL & MGMT 04/20/2015 [facility] INTERV RAD   IR IVC FILTER PLMT / S&I /IMG GUID/MOD SED  11/25/2020   IR RADIOLOGIST EVAL & MGMT  10/26/2021   IR RADIOLOGIST EVAL & MGMT  01/26/2022   IR RADIOLOGY PERIPHERAL GUIDED IV START  11/25/2020   IR THROMBECT VENO MECH MOD SED  10/08/2021   IR US GUIDE VASC ACCESS RIGHT  11/25/2020   IR US GUIDE VASC ACCESS RIGHT  10/08/2021   IR VENO/EXT/UNI RIGHT  10/12/2021   KNEE ARTHROSCOPY Bilateral right 08-08-2000;  left 11-23-2000   meniscal repair and chondroplasty's   KNEE ARTHROSCOPY  03/01/2012   Procedure: ARTHROSCOPY KNEE;  Surgeon: Gearlean Alf, MD;  Location: [facility];  Service: Orthopedics;  Laterality: Left;  WITH SYNOVECTOMY    LAPAROTOMY N/A 05/14/2015   Procedure: EXPLORATORY LAPAROTOMY;  Surgeon: Armandina Gemma, MD;  [facility];  Service: General;  Laterality: N/A;   MASS EXCISION N/A 12/24/2019   Procedure: EXCISION LIP CARCINOMA;  Surgeon: Leta Baptist, MD;  Location: [place];  Service: ENT;  Laterality: N/A;   ORIF LEFT ANKLE FX  08/20/2009   distal tib-fib and malleolus   ROTATOR CUFF REPAIR Bilateral right 1994; left 1993, left 1993   TOTAL KNEE ARTHROPLASTY Right 06/02/2013   Procedure: RIGHT TOTAL KNEE  ARTHROPLASTY;  Surgeon: Gearlean Alf, MD;  [facility];  Service: Orthopedics;  Laterality: Right;   TOTAL KNEE ARTHROPLASTY Left 01-31-2008   dr Wynelle Link   TOTAL KNEE REVISION Right 01/23/2018   Procedure: right knee polyethylene revision;  Surgeon: Gaynelle Arabian, MD;  [facility];  Service: Orthopedics;  Laterality: Right;  31mn   TRANSURETHRAL RESECTION OF BLADDER TUMOR N/A 05/23/2021   Procedure: TRANSURETHRAL BIOPSY OF BLADDER;  Surgeon: DFranchot Gallo MD;  [facility]  Service: Urology;  Laterality: N/A;   TRANSURETHRAL RESECTION OF PROSTATE N/A 04/21/2019   Procedure: TRANSURETHRAL RESECTION OF THE PROSTATE (TURP);  Surgeon: DDiona Fanti  Annie Main, MD;  Location: [facility];  Service: Urology;  Laterality: N/A;   UMBILICAL HERNIA REPAIR  08-11-1999   dr Ninfa Linden   UPPER GASTROINTESTINAL ENDOSCOPY  sept 2016   WOUND EXPLORATION N/A 09/21/2016   Procedure: WOUND EXPLORATION AND DEBRIDEMENT ABDOMINAL WALL;  Surgeon: Armandina Gemma, MD;  [facility];  Service: General;  Laterality: N/A;    Allergies: Ditropan [oxybutynin], Quinine, Vibra-tab [doxycycline], Voltaren [diclofenac], Zoloft [sertraline], Cymbalta [duloxetine hcl], Morphine and related, Oxycontin [oxycodone hcl], Tape, Voltaren [diclofenac sodium], Zohydro er [hydrocodone bitartrate er], Fentanyl, and Neurontin [gabapentin]  Medications: Prior to Admission medications   Medication Sig Start Date End Date Taking? Authorizing Provider  acetaminophen (TYLENOL) 500 MG tablet Take 500 mg by mouth every 6 (six) hours as needed for mild pain or headache.   Yes [provider]  aspirin EC 81 MG tablet Take 81 mg by mouth daily. Swallow whole.   Yes [provider]  carvedilol (COREG) 25 MG tablet Take 1 tablet by mouth once a day every evening 07/08/21  Yes   dabigatran (PRADAXA) 150 MG CAPS capsule Take 1 capsule (150 mg total) by mouth 2 (two) times daily. 04/19/22   Yes Ennever, Rudell Cobb, MD  diphenhydramine-acetaminophen (TYLENOL PM) 25-500 MG TABS tablet Take 1 tablet by mouth at bedtime.   Yes [provider]  docusate sodium (COLACE) 100 MG capsule Take 100 mg by mouth 2 (two) times daily.   Yes [provider]  Dulaglutide (TRULICITY) 4.5 0000000 SOPN Inject 4.5 mg into the skin once a week. 03/10/22  Yes   empagliflozin (JARDIANCE) 25 MG TABS tablet Take 1 tablet (25 mg total) by mouth daily. 03/10/22  Yes   fluticasone (FLONASE) 50 MCG/ACT nasal spray Place 2 sprays into both nostrils daily. Patient taking differently: Place 1-2 sprays into both nostrils daily as needed for allergies. 02/21/22  Yes   furosemide (LASIX) 20 MG tablet Take 20 mg by mouth daily as needed for edema.   Yes [provider]  Insulin Degludec FlexTouch 100 UNIT/ML SOPN Inject 70 Units into the skin daily. 03/10/22  Yes   lansoprazole (PREVACID) 30 MG capsule Take 1 capsule (30 mg total) by mouth daily before a meal. 04/05/22  Yes   levocetirizine (XYZAL) 5 MG tablet Take 5 mg by mouth every evening.   Yes [provider]  losartan (COZAAR) 50 MG tablet Take 1 tablet by mouth once every evening 07/08/21  Yes   magnesium oxide (MAG-OX) 400 MG tablet Take 800 mg by mouth at bedtime.   Yes [provider]  nystatin (MYCOSTATIN) 100000 UNIT/ML suspension Use 4-5 mL for Mouth/Throat as directed four times a day Patient taking differently: Take 5 mLs by mouth 4 (four) times daily as needed (thrush). 07/08/21  Yes   ondansetron (ZOFRAN-ODT) 4 MG disintegrating tablet Take 1 tablet (4 mg total) by mouth every 8 (eight) hours as needed for nausea or vomiting. 10/10/21  Yes Amin, Ankit Chirag, MD  pravastatin (PRAVACHOL) 20 MG tablet Take 1 tablet (20 mg total) by mouth 2 (two) times a week. Patient taking differently: Take 20 mg by mouth 2 (two) times a week. Take on Monday and Friday Evenings 03/16/22  Yes   pregabalin (LYRICA) 50 MG capsule Take 1-2  capsules (50-100 mg total) by mouth at bedtime. Patient taking differently: Take 50 mg by mouth at bedtime. 03/09/22  Yes      Family History  Problem Relation Age of Onset   Bone cancer  Father        Jaw   Heart disease Paternal Uncle    Diabetes Maternal Grandmother    Factor V Leiden deficiency Daughter    Colon cancer Neg Hx    Esophageal cancer Neg Hx    Stomach cancer Neg Hx    Rectal cancer Neg Hx    Migraines Neg Hx     Social History   Socioeconomic History   Marital status: Married    Spouse name: Ivin Booty   Number of children: 2   Years of education: Not on file   Highest education level: Not on file  Occupational History   Occupation: retired   Tobacco Use   Smoking status: Former    Packs/day: 4.00    Years: 17.00    Total pack years: 68.00    Types: Cigarettes    Quit date: 02/21/1975    Years since quitting: 47.1   Smokeless tobacco: Never  Vaping Use   Vaping Use: Never used  Substance and Sexual Activity   Alcohol use: No    Alcohol/week: 0.0 standard drinks of alcohol   Drug use: No   Sexual activity: Not Currently  Other Topics Concern   Not on file  Social History Narrative   Married, retired Sport and exercise psychologist   1 son 1 daughter   2 caffeine free (will switch off once supply is finished with caffiene).       Social Determinants of Health   Financial Resource Strain: Not on file  Food Insecurity: No Food Insecurity (04/22/2022)   Hunger Vital Sign    Worried About Running Out of Food in the Last Year: Never true    Ran Out of Food in the Last Year: Never true  Transportation Needs: No Transportation Needs (04/22/2022)   PRAPARE - Hydrologist (Medical): No    Lack of Transportation (Non-Medical): No  Physical Activity: Not on file  Stress: Not on file  Social Connections: Not on file     Review of Systems: A 12 point ROS discussed and pertinent positives are indicated in the HPI above.  All other systems are  negative.  Review of Systems  Vital Signs: BP 134/67 (BP Location: Left Arm)   Pulse 65   Temp (!) 97.5 F (36.4 C) (Oral)   Resp 16   Ht '5\' 11"'$  (1.803 m)   Wt 263 lb (119.3 kg)   SpO2 96%   BMI 36.68 kg/m   Physical Exam Vitals reviewed.  Constitutional:      Appearance: Normal appearance.  HENT:     Head: Normocephalic and atraumatic.  Eyes:     Extraocular Movements: Extraocular movements intact.  Cardiovascular:     Rate and Rhythm: Normal rate and regular rhythm.  Pulmonary:     Effort: Pulmonary effort is normal. No respiratory distress.     Breath sounds: Normal breath sounds.  Abdominal:     General: There is no distension.     Palpations: Abdomen is soft.     Tenderness: There is no abdominal tenderness.  Musculoskeletal:        General: Swelling present. Normal range of motion.     Cervical back: Normal range of motion.     Comments: Right lower extremity swelling.  Skin:    General: Skin is warm and dry.  Neurological:     General: No focal deficit present.     Mental Status: He is alert and oriented to person, place, and time.  Psychiatric:        Mood and Affect: Mood normal.        Behavior: Behavior normal.        Thought Content: Thought content normal.        Judgment: Judgment normal.     Imaging: US Venous Img Lower Unilateral Right (DVT)  Result Date: 04/21/2022 CLINICAL DATA:  Right lower extremity pain and swelling. EXAM: Right LOWER EXTREMITY VENOUS DOPPLER ULTRASOUND TECHNIQUE: Gray-scale sonography with graded compression, as well as color Doppler and duplex ultrasound were performed to evaluate the lower extremity deep venous systems from the level of the common femoral vein and including the common femoral, femoral, profunda femoral, popliteal and calf veins including the posterior tibial, peroneal and gastrocnemius veins when visible. The superficial great saphenous vein was also interrogated. Spectral Doppler was utilized to evaluate  flow at rest and with distal augmentation maneuvers in the common femoral, femoral and popliteal veins. COMPARISON:  January 23, 2022. FINDINGS: Contralateral Common Femoral Vein: Respiratory phasicity is normal and symmetric with the symptomatic side. No evidence of thrombus. Normal compressibility. Common Femoral Vein: No evidence of thrombus. Normal compressibility, respiratory phasicity and response to augmentation. Saphenofemoral Junction: No evidence of thrombus. Normal compressibility and flow on color Doppler imaging. Profunda Femoral Vein: Noncompressible with no flow consistent with occlusive thrombus. Femoral Vein: Noncompressible with no flow consistent with occlusive thrombus. Popliteal Vein: Noncompressible with no flow consistent with occlusive thrombus. Calf Veins: No evidence of thrombus. Normal compressibility and flow on color Doppler imaging. Superficial Great Saphenous Vein: No evidence of thrombus. Normal compressibility. Venous Reflux:  None. Other Findings: 1.2 cm enlarged lymph node is noted in right groin region with fatty hilum. IMPRESSION: Occlusive deep venous thrombosis is noted in the right profundus femoral, superficial femoral and popliteal veins. 1.2 cm enlarged lymph node is noted in right groin region which most likely is reactive or inflammatory in etiology. Follow-up ultrasound in 2-3 months is recommended to ensure resolution or stability. Electronically Signed   By: Marijo Conception M.D.   On: 04/21/2022 15:44    Labs:  CBC: Recent Labs    12/29/21 1455 04/19/22 1510 04/21/22 1715 04/22/22 0525  WBC 9.1 7.2 7.3 9.2  HGB 12.9* 12.2* 11.3* 11.2*  HCT 41.1 39.5 35.9* 36.4*  PLT 492* 380 514* 514*    COAGS: Recent Labs    10/06/21 1330 10/07/21 0223 10/08/21 0245 10/09/21 0357 10/09/21 1455 04/21/22 1715  INR 1.3*  --   --   --   --  1.5*  APTT 35   < > 100* 107* 78* 55*   < > = values in this interval not displayed.    BMP: Recent Labs     12/29/21 1455 04/19/22 1510 04/21/22 1715 04/22/22 0525  NA 138 138 136 139  K 4.7 3.9 3.9 3.6  CL 105 109 111 110  CO2 26 21* 21* 22  GLUCOSE 160* 135* 82 106*  BUN '23 19 16 16  '$ CALCIUM 10.8* 10.6* 9.4 9.9  CREATININE 1.19 0.96 0.85 0.87  GFRNONAA >60 >60 >60 >60    LIVER FUNCTION TESTS: Recent Labs    10/14/21 1454 11/10/21 1138 12/29/21 1455 04/19/22 1510  BILITOT 0.4 0.4 0.3 0.4  AST 22 19 14* 11*  ALT '24 20 16 10  '$ ALKPHOS 97 88 75 73  PROT 7.4 7.8 7.2 6.8  ALBUMIN 4.2 4.4 4.1 4.1    TUMOR MARKERS: No results for input(s): "AFPTM", "CEA", "CA199", "CHROMGRNA" in the  last 8760 hours.  Assessment and Plan:  Recurrent right DVT despite anticoagulation. Existing IVC filter.  Will plan for venography and mechanical thrombectomy on Monday by Dr. Maryelizabeth Kaufmann.   First will need a CT venogram to evalute the extent of the thrombus and determine if there is caval thrombus and if the filter is involved.  Will evaluate the CT and plan IR Procedure accordingly.  Thank you for allowing our service to participate in [place]. 's care.  Electronically Signed: Murrell Redden, PA-C   04/22/2022, 9:27 AM      I spent a total of    25 Minutes in face to face in clinical consultation, greater than 50% of which was counseling/coordinating care for evaluation for mechanical thrombectomy.

## 2022-04-22 NOTE — Progress Notes (Signed)
PROGRESS NOTE    Derek [name]  [id number] DOB: [birth year] DOA: 04/21/2022 PCP: [name], MD  Outpatient Specialists:     Brief Narrative:  Patient is a 77 year old male with history of factor V Leiden, prior right lower extremity DVT and s/p IVC filter placement.  Patient presents with swelling and pain of the right lower extremity.  Ultrasound of the lower extremity revealed extensive DVT.  Apparently, patient was seen by Dr. [name], local hematologist who ordered the Doppler ultrasound of the lower extremity.  Patient had failed Eliquis.  Patient was switched to practice.  Patient has developed another DVT on Pradaxa.  Patient is currently on heparin drip.  Interventional radiology team has been consulted for possible mechanical thrombectomy.  Interventional radiology team will see patient on Monday (2 days time).  N.p.o. after midnight tomorrow.  Further management depend on hospital course.   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 Now recurrent DVT despite pradaxa (failed pradaxa). Heparin gtt for the moment Dr. [name] consult for AM re: what to put him on for [facility] longer term Per Dr. Maryelizabeth Kaufmann (who also did intervention last time): "Good evening. Let's try Hep gtt. I will be at [facility] on Monday and can evaluate him for thrombectomy then. I suspect the same as Dr Alcario Drought that [facility] may not be enough but we need to give it a try. Please have him NPO after MN Sunday night for potential  intervention on Monday." Needs repeat US in 2-3 months to make sure the 1.2cm lymph node in R groin has improved. 04/22/2022: Continue heparin drip.  Keep n.p.o. after midnight tomorrow.  IR team to see patient on Monday for possible mechanical thrombectomy.   Chronic back pain Lyrica didn't work so not taking this any more. Muscle relaxants no good. No longer on long term opiates, had bad experience with them. Will try a 1 time of toradol tonight to see if any help. 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 04/22/2022: Trial of lidocaine patch.   Benign essential HTN Cont home BP meds   DM2 (diabetes mellitus, type 2) (HCC) Resistant scale SSI AC/HS Cont Trulicity Cont Jardiance Cont degludec at slightly reduced dose of 50u daily. 04/22/2022: Continue to monitor and replace.   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      DVT prophylaxis: Heparin drip Code Status: Full code Family Communication:  Disposition Plan: Home eventually   Consultants:  Patient was seen by Dr. [name], local hematologist.  Procedures:  Doppler ultrasound of the lower extremities.  Antimicrobials:  None.   Subjective: Right lower extremity swelling and pain.  Objective: Vitals:   04/21/22 1826 04/21/22 1827 04/21/22 2235 04/22/22 0441  BP:   (!) 149/93 134/67  Pulse: 74 69 69 65  Resp:   20 19  Temp:   98.1 F (36.7 C) (!) 97.5 F (36.4 C)  TempSrc:  Oral Oral  SpO2: 100% 100% 100% 96%  Weight:      Height:        Intake/Output Summary (Last 24 hours) at 04/22/2022 0747 Last data filed at 04/22/2022 [id number] Gross per 24 hour  Intake 237.55 ml  Output 800 ml  Net -562.45 ml   Filed Weights   04/21/22 1623  Weight: 119.3 kg    Examination:  General exam: Appears calm and comfortable.  Patient is obese. Respiratory system: Clear to  auscultation.  Cardiovascular system: S1 & S2 heard Gastrointestinal system: Abdomen is obese, soft and nontender.   Central nervous system: Awake and alert.  Patient moves all extremities.  Extremities: Swelling of right lower extremity.   Data Reviewed: I have personally reviewed following labs and imaging studies  CBC: Recent Labs  Lab 04/19/22 1510 04/21/22 1715 04/22/22 0525  WBC 7.2 7.3 9.2  NEUTROABS 2.7 2.9  --   HGB 12.2* 11.3* 11.2*  HCT 39.5 35.9* 36.4*  MCV 81.8 79.4* 82.0  PLT 380 514* 0000000*   Basic Metabolic Panel: Recent Labs  Lab 04/19/22 1510 04/21/22 1715 04/22/22 0525  NA 138 136 139  K 3.9 3.9 3.6  CL 109 111 110  CO2 21* 21* 22  GLUCOSE 135* 82 106*  BUN '19 16 16  '$ CREATININE 0.96 0.85 0.87  CALCIUM 10.6* 9.4 9.9   GFR: Estimated Creatinine Clearance: 93.4 mL/min (by C-G formula based on SCr of 0.87 mg/dL). Liver Function Tests: Recent Labs  Lab 04/19/22 1510  AST 11*  ALT 10  ALKPHOS 73  BILITOT 0.4  PROT 6.8  ALBUMIN 4.1   No results for input(s): "LIPASE", "AMYLASE" in the last 168 hours. No results for input(s): "AMMONIA" in the last 168 hours. Coagulation Profile: Recent Labs  Lab 04/21/22 1715  INR 1.5*   Cardiac Enzymes: No results for input(s): "CKTOTAL", "CKMB", "CKMBINDEX", "TROPONINI" in the last 168 hours. BNP (last 3 results) No results for input(s): "PROBNP" in the last 8760 hours. HbA1C: No results for input(s): "HGBA1C" in the last 72 hours. CBG: Recent Labs  Lab 04/21/22 1811 04/21/22 1942 04/21/22 2310 04/22/22 0009 04/22/22 0729  GLUCAP 61* 105* 68* 98 76   Lipid Profile: No results for input(s): "CHOL", "HDL", "LDLCALC", "TRIG", "CHOLHDL", "LDLDIRECT" in the last 72 hours. Thyroid Function Tests: No results for input(s): "TSH", "T4TOTAL", "FREET4", "T3FREE", "THYROIDAB" in the last 72 hours. Anemia Panel: Recent Labs    04/19/22 1510 04/19/22 1511  FERRITIN  --  7*  TIBC 406  --   IRON 33*  --    RETICCTPCT  --  1.6   Urine analysis:    Component Value Date/Time   COLORURINE YELLOW 04/21/2022 1845   APPEARANCEUR CLOUDY (A) 04/21/2022 1845   LABSPEC 1.015 04/21/2022 1845   PHURINE 5.5 04/21/2022 1845   GLUCOSEU >=500 (A) 04/21/2022 1845   HGBUR LARGE (A) 04/21/2022 1845   BILIRUBINUR NEGATIVE 04/21/2022 1845   BILIRUBINUR negative 07/04/2011 1902   KETONESUR NEGATIVE 04/21/2022 1845   PROTEINUR NEGATIVE 04/21/2022 1845   UROBILINOGEN 0.2 05/22/2013 0924   NITRITE NEGATIVE 04/21/2022 1845   LEUKOCYTESUR NEGATIVE 04/21/2022 1845   Sepsis Labs: '@LABRCNTIP'$ (procalcitonin:4,lacticidven:4)  )No results found for this or any previous visit (from the past 240 hour(s)).       Radiology Studies: US Venous Img Lower Unilateral Right (DVT)  Result Date: 04/21/2022 CLINICAL DATA:  Right lower extremity pain and swelling. EXAM: Right LOWER EXTREMITY VENOUS DOPPLER ULTRASOUND TECHNIQUE: Gray-scale sonography with graded compression, as  well as color Doppler and duplex ultrasound were performed to evaluate the lower extremity deep venous systems from the level of the common femoral vein and including the common femoral, femoral, profunda femoral, popliteal and calf veins including the posterior tibial, peroneal and gastrocnemius veins when visible. The superficial great saphenous vein was also interrogated. Spectral Doppler was utilized to evaluate flow at rest and with distal augmentation maneuvers in the common femoral, femoral and popliteal veins. COMPARISON:  January 23, 2022. FINDINGS: Contralateral Common Femoral Vein: Respiratory phasicity is normal and symmetric with the symptomatic side. No evidence of thrombus. Normal compressibility. Common Femoral Vein: No evidence of thrombus. Normal compressibility, respiratory phasicity and response to augmentation. Saphenofemoral Junction: No evidence of thrombus. Normal compressibility and flow on color Doppler imaging. Profunda Femoral Vein:  Noncompressible with no flow consistent with occlusive thrombus. Femoral Vein: Noncompressible with no flow consistent with occlusive thrombus. Popliteal Vein: Noncompressible with no flow consistent with occlusive thrombus. Calf Veins: No evidence of thrombus. Normal compressibility and flow on color Doppler imaging. Superficial Great Saphenous Vein: No evidence of thrombus. Normal compressibility. Venous Reflux:  None. Other Findings: 1.2 cm enlarged lymph node is noted in right groin region with fatty hilum. IMPRESSION: Occlusive deep venous thrombosis is noted in the right profundus femoral, superficial femoral and popliteal veins. 1.2 cm enlarged lymph node is noted in right groin region which most likely is reactive or inflammatory in etiology. Follow-up ultrasound in 2-3 months is recommended to ensure resolution or stability. Electronically Signed   By: Marijo Conception M.D.   On: 04/21/2022 15:44        Scheduled Meds:  diphenhydrAMINE  25 mg Oral QHS   And   acetaminophen  500 mg Oral QHS   aspirin EC  81 mg Oral Daily   carvedilol  25 mg Oral QHS   docusate sodium  100 mg Oral BID   [START ON 04/24/2022] Dulaglutide  4.5 mg Subcutaneous Weekly   empagliflozin  25 mg Oral Daily   insulin aspart  0-20 Units Subcutaneous TID WC   insulin aspart  0-5 Units Subcutaneous QHS   insulin glargine-yfgn  50 Units Subcutaneous Daily   loratadine  10 mg Oral QHS   losartan  50 mg Oral QHS   magnesium oxide  800 mg Oral QHS   pantoprazole  40 mg Oral Daily   pravastatin  20 mg Oral Once per day on Mon Thu   Continuous Infusions:  ferumoxytol (FERAHEME) 510 mg in sodium chloride 0.9 % 100 mL IVPB     heparin 1,700 Units/hr (04/22/22 0602)     LOS: 0 days    Time spent: 35 minutes.    Dana Allan, MD  [facility] Pager #: [phone] 7PM-7AM contact night coverage as above

## 2022-04-22 NOTE — Progress Notes (Addendum)
[place] for heparin Indication: DVT  Allergies  Allergen Reactions   Ditropan [Oxybutynin] Swelling and Other (See Comments)   Quinine Other (See Comments)    Platelets dropped- consumptive coagulopathy     Vibra-Tab [Doxycycline] Other (See Comments)    Epistaxis     Voltaren [Diclofenac] Swelling and Other (See Comments)    Elevated liver enzymes Lip swelling    Zoloft [Sertraline] Other (See Comments)    Insomnia Acute urinary retention   Cymbalta [Duloxetine Hcl] Other (See Comments)    Night sweats   Morphine And Related Nausea And Vomiting, Swelling and Other (See Comments)    Facial and lip swelling OK to use IR form of hydrocodone and oxycodone   Oxycontin [Oxycodone Hcl] Swelling and Other (See Comments)    Lip swelling OK to use IR form   Tape Other (See Comments)    Skin tears easily  SKIN IS FRAGILE- CERTAIN BANDAGES TEAR OFF THE SKIN, "BANDAIDS ARE OKAY."   Voltaren [Diclofenac Sodium] Other (See Comments)    Elevated liver enzymes   Zohydro Er [Hydrocodone Bitartrate Er] Swelling    Facial swelling OK to use IR form   Fentanyl Nausea And Vomiting and Other (See Comments)    Nausea, vomiting ad headaches   Neurontin [Gabapentin] Other (See Comments)    Unknown reaction     Patient Measurements: Height: '5\' 11"'$  (180.3 cm) Weight: 119.3 kg (263 lb) IBW/kg (Calculated) : 75.3 Heparin Dosing Weight: 101.7 kg  Vital Signs: Temp: 97.5 F (36.4 C) (03/02 0441) Temp Source: Oral (03/02 0441) BP: 134/67 (03/02 0441) Pulse Rate: 65 (03/02 0441)  Labs: Recent Labs    04/19/22 1510 04/21/22 1715 04/22/22 0525 04/22/22 0905  HGB 12.2* 11.3* 11.2*  --   HCT 39.5 35.9* 36.4*  --   PLT 380 514* 514*  --   APTT  --  55*  --   --   LABPROT  --  17.6*  --   --   INR  --  1.5*  --   --   HEPARINUNFRC  --   --  0.11* 0.53  CREATININE 0.96 0.85 0.87  --      Estimated Creatinine Clearance: 93.4 mL/min (by C-G  formula based on SCr of 0.87 mg/dL).   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" no pulmonary md, sees dr [name]   Factor V Leiden mutation ([place])    "never had blood clots"   GERD (gastroesophageal reflux disease)    Hiatal hernia    History of kidney stones    multiple kidney stones-not a problem at present   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, pT1 pNX) no further treatment   History of panic attacks    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 RLE [place] + for DVT. Pharmacy consulted for IV heparin dosing. Baseline CBC: Hgb 11.3, Plt 514K.  PT/INR 17.6/1.5, aPTT 55 seconds.   Today, 04/22/22:  Heparin level = 0.53 units/mL, therapeutic CBC: Hgb 11.2, low/stable. Plt 514K, remains elevated. No bleeding or infusion issues noted per nursing.   Goal of Therapy:  Heparin level 0.3-0.7 units/ml Monitor platelets by anticoagulation protocol: Yes   Plan:  Continue heparin infusion at 1700 units/hr Check heparin level in 8 hours to confirm remains in therapeutic range Daily CBC, heparin level F/u plans for thrombectomy, long-term AC   [name], PharmD, BCPS Clinical Pharmacist 04/22/2022 10:03 AM   Addendum:  [id number] heparin level = 0.49 units/mL, remains therapeutic No bleeding or infusion issues per nursing   Plan: Continue heparin infusion at 1700 units/hr Daily CBC, heparin level Noted plans for mechanical thrombectomy on Monday   [name], PharmD, BCPS Clinical Pharmacist 04/22/2022 5:48 PM

## 2022-04-22 NOTE — Progress Notes (Signed)
Hematology and Oncology Follow Up Visit  Derek [name] [id number] September 30, 1945 77 y.o. 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  The main problem that he is having right now is that he has had hematuria.  Unfortunately, he is on blood thinner.  We really cannot stop the Pradaxa on him.  He does see Urology.  I think he has an appointment to see them soon.  He also is having some more pain and swelling in the right lower leg.  His last Doppler that was done was back in December.  I think would be worthwhile getting another Doppler on him.  He still has a lot of issues with pain.  He has had chronic back pain.  He has a chronic abdominal pain.  He does have multiple health issues.  He does have diabetes but this seems to be doing much better from what he says.  He has had no fever.  He has had no problems with COVID.  Overall, I would have said that his performance status is probably ECOG 2.     Medications:  Current Facility-Administered Medications:    acetaminophen (TYLENOL) tablet 650 mg, 650 mg, Oral, Q6H PRN **OR** acetaminophen (TYLENOL) suppository 650 mg, 650 mg, Rectal, Q6H PRN, Alcario Drought, Jared M, DO   diphenhydrAMINE (BENADRYL) capsule 25 mg, 25 mg, Oral, QHS, 25 mg at 04/22/22 0105 **AND** acetaminophen (TYLENOL) tablet 500 mg, 500 mg, Oral, QHS, Alcario Drought, Jared M, DO, 500 mg at 04/22/22 0105   aspirin EC tablet 81 mg, 81 mg, Oral, Daily, Alcario Drought, Jared M, DO   carvedilol (COREG) tablet 25 mg, 25 mg, Oral, QHS, Gardner, Jared M, DO, 25 mg at 04/22/22 0105   docusate sodium (COLACE) capsule 100 mg, 100 mg, Oral, BID, Alcario Drought, Jared M, DO, 100 mg at 04/22/22 0106   [START ON 04/24/2022] Dulaglutide SOPN 4.5 mg, 4.5 mg,  Subcutaneous, Weekly, Alcario Drought, Jared M, DO   empagliflozin (JARDIANCE) tablet 25 mg, 25 mg, Oral, Daily, Alcario Drought, Jared M, DO   fluticasone (FLONASE) 50 MCG/ACT nasal spray 1-2 spray, 1-2 spray, Each Nare, Daily PRN, Alcario Drought, Jared M, DO   heparin ADULT infusion 100 units/mL (25000 units/25m), 1,700 Units/hr, Intravenous, Continuous, OBertis Ruddy RPH, Last Rate: 17 mL/hr at 04/22/22 0602, 1,700 Units/hr at 04/22/22 0602   insulin aspart (novoLOG) injection 0-20 Units, 0-20 Units, Subcutaneous, TID WC, GAlcario Drought Jared M, DO   insulin aspart (novoLOG) injection 0-5 Units, 0-5 Units, Subcutaneous, QHS, GAlcario Drought Jared M, DO   insulin glargine-yfgn (SEMGLEE) injection 50 Units, 50 Units, Subcutaneous, Daily, GAlcario Drought Jared M, DO   loratadine (CLARITIN) tablet 10 mg, 10 mg, Oral, QHS, Gardner, Jared M, DO, 10 mg at 04/22/22 0106   losartan (COZAAR) tablet 50 mg, 50 mg, Oral, QHS, GAlcario Drought Jared M, DO, 50 mg at 04/22/22 0105   magnesium oxide (MAG-OX) tablet 800 mg, 800 mg, Oral, QHS, Gardner, Jared M, DO, 800 mg at 04/22/22 0105   ondansetron (ZOFRAN) tablet 4 mg, 4 mg, Oral, Q6H PRN **OR** ondansetron (ZOFRAN) injection 4 mg, 4 mg, Intravenous, Q6H PRN, GAlcario Drought Jared M, DO   pantoprazole (PROTONIX) EC tablet 40 mg, 40 mg, Oral, Daily, GAlcario Drought Jared M, DO   pravastatin (PRAVACHOL) tablet  20 mg, 20 mg, Oral, Once per day on Mon Thu, Gardner, Jared M, DO, 20 mg at 04/22/22 0106  Allergies:  Allergies  Allergen Reactions   Ditropan [Oxybutynin] Swelling and Other (See Comments)   Quinine Other (See Comments)    Platelets dropped- consumptive coagulopathy     Vibra-Tab [Doxycycline] Other (See Comments)    Epistaxis     Voltaren [Diclofenac] Swelling and Other (See Comments)    Elevated liver enzymes Lip swelling    Zoloft [Sertraline] Other (See Comments)    Insomnia Acute urinary retention   Cymbalta [Duloxetine Hcl] Other (See Comments)    Night sweats   Morphine And Related Nausea  And Vomiting, Swelling and Other (See Comments)    Facial and lip swelling OK to use IR form of hydrocodone and oxycodone   Oxycontin [Oxycodone Hcl] Swelling and Other (See Comments)    Lip swelling OK to use IR form   Tape Other (See Comments)    Skin tears easily  SKIN IS FRAGILE- CERTAIN BANDAGES TEAR OFF THE SKIN, "BANDAIDS ARE OKAY."   Voltaren [Diclofenac Sodium] Other (See Comments)    Elevated liver enzymes   Zohydro Er [Hydrocodone Bitartrate Er] Swelling    Facial swelling OK to use IR form   Fentanyl Nausea And Vomiting and Other (See Comments)    Nausea, vomiting ad headaches   Neurontin [Gabapentin] Other (See Comments)    Unknown reaction     Past Medical History, Surgical history, Social history, and Family History were reviewed and updated.  Review of Systems: Review of Systems  Constitutional:  Positive for fatigue.  HENT:  Negative.    Eyes: Negative.   Respiratory: Negative.    Cardiovascular:  Positive for leg swelling.  Gastrointestinal: Negative.   Endocrine: Negative.   Genitourinary:  Positive for hematuria.   Musculoskeletal:  Positive for back pain, myalgias and neck pain.  Neurological:  Positive for dizziness and headaches.  Hematological: Negative.   Psychiatric/Behavioral: Negative.      Physical Exam:  height is '5\' 11"'$  (1.803 m) and weight is 263 lb (119.3 kg). His oral temperature is 97.5 F (36.4 C) (abnormal). His blood pressure is 134/67 and his pulse is 65. His respiration is 19 and oxygen saturation is 96%.   Wt Readings from Last 3 Encounters:  04/21/22 263 lb (119.3 kg)  04/19/22 266 lb (120.7 kg)  01/03/22 268 lb 3.2 oz (121.7 kg)    Physical Exam Vitals reviewed.  HENT:     Head: Normocephalic and atraumatic.  Eyes:     Pupils: Pupils are equal, round, and reactive to light.  Cardiovascular:     Rate and Rhythm: Normal rate and regular rhythm.     Heart sounds: Normal heart sounds.  Pulmonary:     Effort: Pulmonary  effort is normal.     Breath sounds: Normal breath sounds.  Abdominal:     General: Bowel sounds are normal.     Palpations: Abdomen is soft.  Musculoskeletal:        General: No tenderness or deformity. Normal range of motion.     Cervical back: Normal range of motion.     Comments: His extremities does show some mild swelling in the right leg.  It is not as firm.  He has good warmth to the leg.  He has palpable pulses in the distal extremity.    Lymphadenopathy:     Cervical: No cervical adenopathy.  Skin:    General: Skin is warm and  dry.     Findings: No erythema or rash.  Neurological:     Mental Status: He is alert and oriented to person, place, and time.  Psychiatric:        Behavior: Behavior normal.        Thought Content: Thought content normal.        Judgment: Judgment normal.      Lab Results  Component Value Date   WBC 9.2 04/22/2022   HGB 11.2 (L) 04/22/2022   HCT 36.4 (L) 04/22/2022   MCV 82.0 04/22/2022   PLT 514 (H) 04/22/2022     Chemistry      Component Value Date/Time   NA 139 04/22/2022 0525   K 3.6 04/22/2022 0525   CL 110 04/22/2022 0525   CO2 22 04/22/2022 0525   BUN 16 04/22/2022 0525   CREATININE 0.87 04/22/2022 0525   CREATININE 0.96 04/19/2022 1510      Component Value Date/Time   CALCIUM 9.9 04/22/2022 0525   ALKPHOS 73 04/19/2022 1510   AST 11 (L) 04/19/2022 1510   ALT 10 04/19/2022 1510   BILITOT 0.4 04/19/2022 1510       Impression and Plan: Derek [name] is a very nice 77 year old white male.  He has had incredible history.  He has had a past history of a neuroendocrine pancreatic tumor.  He apparently was hospitalized for 4 months because of complications.  He now has had recurrent thromboembolic disease.  He required a thrombectomy back in September.  Again this worked incredibly well for him.  He is on Pradaxa.  He will be on Pradaxa for life.  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  We will go ahead and get  another Doppler of his right leg.  I suspect that he probably was have some kind of residual thrombus.  This could certainly be a postphlebitic issue that he is just going to have to manage.  I told her to try to apply some topical hydrocortisone cream to this area pain in the right lower leg.  The thrombocytosis has resolved.  He is going to have to see urology.  I suspect that he is going be iron deficient by the decrease in his MCV.  I do believe that he does have a IVC filter in place.  We will have to see him back in about 2 months.  If, there is a problem with the right leg on the Doppler, he may need to have another thrombectomy.  Other that, we may need to have his anticoagulation changed to something the injectable.    [name], MD 3/2/20247:00 AM   ADDENDUM: Unfortunately, Derek [name] was admitted on 04/21/2022.  We were called the afternoon of 04/21/2022 with the results of his Doppler.  When I had seen him in the office a couple days before, he had new swelling in the right leg.  There is some point tenderness in the right calf.  I am really not surprised by the fact that he was found to have a new acute occlusive thrombus in the right leg.  This was fairly extensive.  I do not know if the IVC filter has anything to do with this, if the IVC filter is plugged up.  He is going need to be on heparin.  Currently, he is on heparin.  I do still see that we can have him on anything other than an injectable anticoagulant now.  I have no confidence in Coumadin.  I think Coumadin would be incredibly difficult to manage with him because of all of his medications.  I am know that IR will be doing their best to try to eradicate this thrombus.  Hopefully, on Monday, they will be able to take him over to [facility] and maybe do a thrombectomy.  He is on heparin right now.  There is still some swelling in the right leg.  He has had no chest wall pain.  He has had no nausea or vomiting.  He has  horrible back pain which is chronic.  I know that he will be monitored by Pharmacy for his heparin dosing.  Again, I think we will going to have to get him back on Lovenox or maybe Arixtra once he has an outpatient.  We will certainly follow along.  Of note, he does have profound iron deficiency.  His ferritin is 7 with an iron saturation of 8%.  I will give him a dose of IV Feraheme.  I know that he will get incredible care by from everybody on 4 E.   Lattie Haw, MD  Michaelyn Barter 4:32

## 2022-04-23 ENCOUNTER — Encounter (HOSPITAL_COMMUNITY): Payer: Self-pay | Admitting: Internal Medicine

## 2022-04-23 DIAGNOSIS — I82401 Acute embolism and thrombosis of unspecified deep veins of right lower extremity: Secondary | ICD-10-CM | POA: Diagnosis not present

## 2022-04-23 LAB — CBC WITH DIFFERENTIAL/PLATELET
Abs Immature Granulocytes: 0.05 10*3/uL (ref 0.00–0.07)
Basophils Absolute: 0.1 10*3/uL (ref 0.0–0.1)
Basophils Relative: 2 %
Eosinophils Absolute: 0.5 10*3/uL (ref 0.0–0.5)
Eosinophils Relative: 6 %
HCT: 37.1 % — ABNORMAL LOW (ref 39.0–52.0)
Hemoglobin: 11.4 g/dL — ABNORMAL LOW (ref 13.0–17.0)
Immature Granulocytes: 1 %
Lymphocytes Relative: 45 %
Lymphs Abs: 3.7 10*3/uL (ref 0.7–4.0)
MCH: 25.3 pg — ABNORMAL LOW (ref 26.0–34.0)
MCHC: 30.7 g/dL (ref 30.0–36.0)
MCV: 82.4 fL (ref 80.0–100.0)
Monocytes Absolute: 1.1 10*3/uL — ABNORMAL HIGH (ref 0.1–1.0)
Monocytes Relative: 13 %
Neutro Abs: 2.7 10*3/uL (ref 1.7–7.7)
Neutrophils Relative %: 33 %
Platelets: 527 10*3/uL — ABNORMAL HIGH (ref 150–400)
RBC: 4.5 MIL/uL (ref 4.22–5.81)
RDW: 18.2 % — ABNORMAL HIGH (ref 11.5–15.5)
WBC: 8.1 10*3/uL (ref 4.0–10.5)
nRBC: 0.4 % — ABNORMAL HIGH (ref 0.0–0.2)

## 2022-04-23 LAB — COMPREHENSIVE METABOLIC PANEL
ALT: 13 U/L (ref 0–44)
AST: 15 U/L (ref 15–41)
Albumin: 3.5 g/dL (ref 3.5–5.0)
Alkaline Phosphatase: 70 U/L (ref 38–126)
Anion gap: 5 (ref 5–15)
BUN: 17 mg/dL (ref 8–23)
CO2: 23 mmol/L (ref 22–32)
Calcium: 10 mg/dL (ref 8.9–10.3)
Chloride: 109 mmol/L (ref 98–111)
Creatinine, Ser: 0.88 mg/dL (ref 0.61–1.24)
GFR, Estimated: >60 mL/min (ref >60)
Glucose, Bld: 86 mg/dL (ref 70–99)
Potassium: 3.6 mmol/L (ref 3.5–5.1)
Sodium: 137 mmol/L (ref 135–145)
Total Bilirubin: 0.5 mg/dL (ref 0.3–1.2)
Total Protein: 6.7 g/dL (ref 6.5–8.1)

## 2022-04-23 LAB — HEPARIN LEVEL (UNFRACTIONATED): Heparin Unfractionated: 0.43 IU/mL (ref 0.30–0.70)

## 2022-04-23 LAB — GLUCOSE, CAPILLARY
Glucose-Capillary: 104 mg/dL — ABNORMAL HIGH (ref 70–99)
Glucose-Capillary: 145 mg/dL — ABNORMAL HIGH (ref 70–99)
Glucose-Capillary: 105 mg/dL — ABNORMAL HIGH (ref 70–99)
Glucose-Capillary: 173 mg/dL — ABNORMAL HIGH (ref 70–99)

## 2022-04-23 MED ORDER — HYDROCODONE-ACETAMINOPHEN 5-325 MG PO TABS
1.0000 | ORAL_TABLET | ORAL | Status: DC | PRN
  Administered 2022-04-23: 1 via ORAL
  Filled 2022-04-23: qty 1

## 2022-04-23 MED ORDER — HYDROCODONE-ACETAMINOPHEN 5-325 MG PO TABS
1.0000 | ORAL_TABLET | Freq: Once | ORAL | Status: AC
  Administered 2022-04-23: 1 via ORAL
  Filled 2022-04-23: qty 1

## 2022-04-23 NOTE — Progress Notes (Signed)
  CT venogram images reviewed by Dr. Maryelizabeth Kaufmann. IVC filter is patent.  Will proceed with RLE thrombectomy tomorrow as IR schedule allows.  NO NEED to stop Heparin drip prior to procedure.  Risks and benefits of venogram with thrombectomy were discussed with the patient including, but not limited to bleeding, infection, vascular injury, contrast induced renal failure, pulmonary embolism, or even death.  This interventional procedure involves the use of X-rays and because of the nature of the planned procedure, it is possible that we will have prolonged use of X-ray fluoroscopy.  Potential radiation risks to you include (but are not limited to) the following: - A slightly elevated risk for cancer  several years later in life. This risk is typically less than 0.5% percent. This risk is low in comparison to the normal incidence of human cancer, which is 33% for women and 50% for men according to the [employer]. - Radiation induced injury can include skin redness, resembling a rash, tissue breakdown / ulcers and hair loss (which can be temporary or permanent).   The likelihood of either of these occurring depends on the difficulty of the procedure and whether you are sensitive to radiation due to previous procedures, disease, or genetic conditions.   IF your procedure requires a prolonged use of radiation, you will be notified and given written instructions for further action.  It is your responsibility to monitor the irradiated area for the 2 weeks following the procedure and to notify your physician if you are concerned that you have suffered a radiation induced injury.    All of the patient's questions were answered, patient is agreeable to proceed.  Consent signed and in chart.  [place] for patient to have narcotic medications for back pain as needed prior to procedure.  Taher Vannote S Caleigha Zale PA-C 04/23/2022 10:37 AM

## 2022-04-23 NOTE — Progress Notes (Addendum)
Patient called RN to room complaining of "20/10" back pain. Patient advised RN that he is in "excruciating" back pain.  Patient stated back pain is chronic but that this pain was sudden in onset. Patient contributed pain onset to discomfort of hospital bed. Patient up out of bed and in chair to assist in comfort. RN offered patient ice/heat pack intervention and patient refused. Patient previously refused lidocaine patch as stated it did not offer any relief. RN asked patient what interventions he implements at home for back pain and he said "none".  Patient advised RN that he was previously on pain medication at home but that they stopped being effective.  Patient stated last hospital admission he received Fentanyl which helped but it caused him to have nausea and vomiting so it was not an effective intervention. Patient received one time dose of Toradol during this admission and patient advised RN this was for back pain.  Patient advised RN this "helped a little" but that he needs "something stronger". RN asked patient what he felt would help with his back pain and he stated "I don't know but it needs to be strong." Patient advised RN that if he can not get relief from back pain, he wishes to leave the hospital.  Messaged MD Ogbata to notify of patient's pain complaints.   MD Ogbata advised to administer one time dose of HYDROcodone-acetaminophen (NORCO/VICODIN) 5-325 MG per tablet and advise if patient tolerates.   Patient has listed allergy to [facility], allergy reviewed by MD and Merrilee Seashore in pharmacy and RN advised okay to administer medication to patient.

## 2022-04-23 NOTE — Progress Notes (Signed)
PROGRESS NOTE    Derek [name]  [id number] DOB: 1945-07-24 DOA: 04/21/2022 PCP: [name], MD  Outpatient Specialists:     Brief Narrative:  Patient is a 77 year old male with history of factor V Leiden, prior right lower extremity DVT and s/p IVC filter placement.  Patient presents with swelling and pain of the right lower extremity.  Ultrasound of the lower extremity revealed extensive DVT.  Apparently, patient was seen by Dr. [name], local hematologist who ordered the Doppler ultrasound of the lower extremity.  Patient had failed Eliquis.  Patient was switched to practice.  Patient has developed another DVT on Pradaxa.  Patient is currently on heparin drip.  Interventional radiology team has been consulted for possible mechanical thrombectomy.  Interventional radiology team will see patient on Monday (2 days time).  N.p.o. after midnight tomorrow.  Further management will depend on hospital course.  04/23/2022: Patient seen.  Interventional radiology team plans to proceed with mechanical thrombectomy tomorrow.  N.p.o. after midnight.  Patient is currently on heparin drip.  Hematology team to advise long-term anticoagulation.  Patient has failed Eliquis and Pradaxa.  Severe back pain reported.   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 Now recurrent DVT despite pradaxa (failed pradaxa). Heparin gtt for the  moment Dr. [name] consult for AM re: what to put him on for [facility] longer term Per Dr. Maryelizabeth Kaufmann (who also did intervention last time): "Good evening. Let's try Hep gtt. I will be at [facility] on Monday and can evaluate him for thrombectomy then. I suspect the same as Dr Alcario Drought that [facility] may not be enough but we need to give it a try. Please have him NPO after MN Sunday night for potential intervention on Monday." Needs repeat US in 2-3 months to make sure the 1.2cm lymph node in R groin has improved. 04/22/2022: Continue heparin drip.  Keep n.p.o. after midnight tomorrow.  IR team to see patient on Monday for possible mechanical thrombectomy. 04/23/2022: See above documentation.  For mechanical thrombectomy tomorrow.   Chronic back pain Lyrica didn't work so not taking this any more. Muscle relaxants no good. No longer on long term opiates, had bad experience with them. Will try a 1 time of toradol tonight to see if any help. 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 04/22/2022: Trial of lidocaine patch. 04/23/2022: Patient reports that lidocaine patches not working.  Patient wants to try P's.  Optimize pain control.   Benign essential HTN Cont home BP meds   DM2 (diabetes mellitus, type 2) (HCC) Resistant scale SSI AC/HS Cont Trulicity Cont Jardiance Cont degludec at slightly reduced dose of 50u daily. 04/23/2022: Continue to monitor and replace.   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      DVT prophylaxis: Heparin drip Code Status: Full code  Family Communication:  Disposition Plan: Home eventually   Consultants:  Patient was seen by Dr. [name], local hematologist.  Procedures:  Doppler ultrasound of the lower extremities.  Antimicrobials:  None.   Subjective: Right lower extremity swelling and pain.  Objective: Vitals:   04/22/22 0913 04/22/22  1415 04/22/22 2209 04/23/22 0458  BP:  (!) 149/82 129/69 (!) 151/71  Pulse:  72 78 71  Resp: '16 18 20 20  '$ Temp:  97.8 F (36.6 C) 97.8 F (36.6 C) 97.7 F (36.5 C)  TempSrc:  Oral  Oral  SpO2:  99% 98% 95%  Weight:      Height:        Intake/Output Summary (Last 24 hours) at 04/23/2022 1202 Last data filed at 04/23/2022 1100 Gross per 24 hour  Intake 715.42 ml  Output 1100 ml  Net -384.58 ml    Filed Weights   04/21/22 1623  Weight: 119.3 kg    Examination:  General exam: Appears calm and comfortable.  Patient is obese. Respiratory system: Clear to auscultation.  Cardiovascular system: S1 & S2 heard Gastrointestinal system: Abdomen is obese, soft and nontender.   Central nervous system: Awake and alert.  Patient moves all extremities.  Extremities: Swelling of right lower extremity.   Data Reviewed: I have personally reviewed following labs and imaging studies  CBC: Recent Labs  Lab 04/19/22 1510 04/21/22 1715 04/22/22 0525 04/23/22 0517  WBC 7.2 7.3 9.2 8.1  NEUTROABS 2.7 2.9  --  2.7  HGB 12.2* 11.3* 11.2* 11.4*  HCT 39.5 35.9* 36.4* 37.1*  MCV 81.8 79.4* 82.0 82.4  PLT 380 514* 514* 527*    Basic Metabolic Panel: Recent Labs  Lab 04/19/22 1510 04/21/22 1715 04/22/22 0525 04/23/22 0517  NA 138 136 139 137  K 3.9 3.9 3.6 3.6  CL 109 111 110 109  CO2 21* 21* 22 23  GLUCOSE 135* 82 106* 86  BUN '19 16 16 17  '$ CREATININE 0.96 0.85 0.87 0.88  CALCIUM 10.6* 9.4 9.9 10.0    GFR: Estimated Creatinine Clearance: 92.4 mL/min (by C-G formula based on SCr of 0.88 mg/dL). Liver Function Tests: Recent Labs  Lab 04/19/22 1510 04/23/22 0517  AST 11* 15  ALT 10 13  ALKPHOS 73 70  BILITOT 0.4 0.5  PROT 6.8 6.7  ALBUMIN 4.1 3.5    No results for input(s): "LIPASE", "AMYLASE" in the last 168 hours. No results for input(s): "AMMONIA" in the last 168 hours. Coagulation Profile: Recent Labs  Lab 04/21/22 1715  INR 1.5*    Cardiac Enzymes: No results  for input(s): "CKTOTAL", "CKMB", "CKMBINDEX", "TROPONINI" in the last 168 hours. BNP (last 3 results) No results for input(s): "PROBNP" in the last 8760 hours. HbA1C: No results for input(s): "HGBA1C" in the last 72 hours. CBG: Recent Labs  Lab 04/22/22 1150 04/22/22 1622 04/22/22 2205 04/23/22 0758 04/23/22 1145  GLUCAP 119* 171* 167* 104* 145*    Lipid Profile: No results for input(s): "CHOL", "HDL", "LDLCALC", "TRIG", "CHOLHDL", "LDLDIRECT" in the last 72 hours. Thyroid Function Tests: No results for input(s): "TSH", "T4TOTAL", "FREET4", "T3FREE", "THYROIDAB" in the last 72 hours. Anemia Panel: No results for input(s): "VITAMINB12", "FOLATE", "FERRITIN", "TIBC", "IRON", "RETICCTPCT" in the last 72 hours.  Urine analysis:    Component Value Date/Time   COLORURINE YELLOW 04/21/2022 1845   APPEARANCEUR CLOUDY (A) 04/21/2022 1845   LABSPEC 1.015 04/21/2022 1845   PHURINE 5.5 04/21/2022 1845   GLUCOSEU >=500 (A) 04/21/2022 1845   HGBUR LARGE (A)  04/21/2022 1845   BILIRUBINUR NEGATIVE 04/21/2022 1845   BILIRUBINUR negative 07/04/2011 1902   KETONESUR NEGATIVE 04/21/2022 1845   PROTEINUR NEGATIVE 04/21/2022 1845   UROBILINOGEN 0.2 05/22/2013 0924   NITRITE NEGATIVE 04/21/2022 1845   LEUKOCYTESUR NEGATIVE 04/21/2022 1845   Sepsis Labs: '@LABRCNTIP'$ (procalcitonin:4,lacticidven:4)  )No results found for this or any previous visit (from the past 240 hour(s)).       Radiology Studies: CT VENOGRAM ABD/PEL  Result Date: 04/22/2022 CLINICAL DATA:  Leg deep vein thrombosis (DVT) suspected concern from caval thrombus. EXAM: CT VENOGRAM ABDOMEN AND PELVIS WITH CONTRAST TECHNIQUE: Multidetector CT imaging of the abdomen and pelvis was performed using the standard protocol during bolus administration of intravenous contrast. Multiplanar reconstructed images and MIPs were obtained and reviewed to evaluate the vascular anatomy. RADIATION DOSE REDUCTION: This exam was performed according  to the departmental dose-optimization program which includes automated exposure control, adjustment of the mA and/or kV according to patient size and/or use of iterative reconstruction technique. CONTRAST:  127m OMNIPAQUE IOHEXOL 350 MG/ML SOLN COMPARISON:  Lower extremity venous duplex, 04/21/2022 and 01/23/2022. CT AP, 10/06/2021 and 03/22/2021. IR fluoroscopy, 10/08/2021. FINDINGS: VASCULAR Suboptimal evaluation, secondary to poor contrast opacification such that emboli could missed. AORTA AND ARTERIAL VASCULATURE: Mild-to-moderate calcified aortoiliac atherosclerotic disease. Normal without aneurysm, dissection, vasculitis or significant stenosis. 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 *Patent IVC common iliac veins, external iliac veins and CFVs bilaterally. *Diminutive appearance of the RIGHT femoral and popliteal veins Review of the MIP images confirms the above findings. NON-VASCULAR Lower chest: Punctate granulomas at the middle lobe. No suspicious pulmonary nodule or mass of the lung bases. Hepatobiliary: No focal liver abnormality is seen. No gallstones, gallbladder wall thickening, or biliary dilatation. Pancreas: Distal pancreatectomy. No pancreatic ductal dilatation or surrounding inflammatory changes. Spleen: Splenectomy. Adrenals/Urinary Tract: Adrenal glands are unremarkable. Kidneys are normal, without renal calculi, focal lesion, or hydronephrosis. Bladder is unremarkable. Stomach/Bowel: Moderate-sized hiatus hernia. No CTV evidence of gastric vascular compromise. Appendix is not definitively visualized. No evidence of bowel wall thickening, distention, or inflammatory changes. Lymphatic: No enlarged abdominal or pelvic lymph nodes. Reproductive: Prostate is unremarkable. Other: Small fat-containing LEFT inguinal hernia versus cord lipoma. Supraumbilical ventral abdominal hernia repair. No abdominopelvic  ascites. Musculoskeletal: Focal anterior abdominal wall contusions, likely injection sites. No acute or significant osseous findings. IMPRESSION: VASCULAR 1. Infrarenal IVC filter with mild medial tilt at its hub. Patent IVC and iliac and external iliac veins bilaterally. 2. Diminutive appearance of the RIGHT femoral and popliteal veins, favored consistent with chronic/post thrombotic change. NON-VASCULAR 1. No acute abdominopelvic process. 2. Hiatus hernia.  No CT evidence of gastric vascular compromise. 3. Aortic Atherosclerosis (ICD10-I70.0). Additional incidental, chronic and senescent findings as above. JMichaelle Birks MD [facility]er[facility] HospitalRadiology Electronically Signed   By: JMichaelle BirksM.D.   On: 04/22/2022 21:50   [place]Venous Img Lower Unilateral Right (DVT)  Result Date: 04/21/2022 CLINICAL DATA:  Right lower extremity pain and swelling. EXAM: Right LOWER EXTREMITY VENOUS DOPPLER ULTRASOUND TECHNIQUE: Gray-scale sonography with graded compression, as well as color Doppler and duplex ultrasound were performed to evaluate the lower extremity deep venous systems from the level of the common femoral vein and including the common femoral, femoral, profunda femoral, popliteal and calf veins including the posterior tibial, peroneal and gastrocnemius veins when visible. The superficial great saphenous vein was also interrogated. Spectral Doppler was utilized to evaluate flow  at rest and with distal augmentation maneuvers in the common femoral, femoral and popliteal veins. COMPARISON:  January 23, 2022. FINDINGS: Contralateral Common Femoral Vein: Respiratory phasicity is normal and symmetric with the symptomatic side. No evidence of thrombus. Normal compressibility. Common Femoral Vein: No evidence of thrombus. Normal compressibility, respiratory phasicity and response to augmentation. Saphenofemoral Junction: No evidence of thrombus. Normal compressibility and flow on  color Doppler imaging. Profunda Femoral Vein: Noncompressible with no flow consistent with occlusive thrombus. Femoral Vein: Noncompressible with no flow consistent with occlusive thrombus. Popliteal Vein: Noncompressible with no flow consistent with occlusive thrombus. Calf Veins: No evidence of thrombus. Normal compressibility and flow on color Doppler imaging. Superficial Great Saphenous Vein: No evidence of thrombus. Normal compressibility. Venous Reflux:  None. Other Findings: 1.2 cm enlarged lymph node is noted in right groin region with fatty hilum. IMPRESSION: Occlusive deep venous thrombosis is noted in the right profundus femoral, superficial femoral and popliteal veins. 1.2 cm enlarged lymph node is noted in right groin region which most likely is reactive or inflammatory in etiology. Follow-up ultrasound in 2-3 months is recommended to ensure resolution or stability. Electronically Signed   By: Marijo Conception M.D.   On: 04/21/2022 15:44        Scheduled Meds:  diphenhydrAMINE  25 mg Oral QHS   And   acetaminophen  500 mg Oral QHS   aspirin EC  81 mg Oral Daily   carvedilol  25 mg Oral QHS   docusate sodium  100 mg Oral BID   [START ON 04/24/2022] Dulaglutide  4.5 mg Subcutaneous Weekly   empagliflozin  25 mg Oral Daily   insulin aspart  0-20 Units Subcutaneous TID WC   insulin aspart  0-5 Units Subcutaneous QHS   insulin glargine-yfgn  50 Units Subcutaneous Daily   lidocaine  1 patch Transdermal Q24H   loratadine  10 mg Oral QHS   losartan  50 mg Oral QHS   magnesium oxide  800 mg Oral QHS   pantoprazole  40 mg Oral Daily   pravastatin  20 mg Oral Once per day on Mon Thu   Continuous Infusions:  heparin 1,700 Units/hr (04/23/22 1100)     LOS: 1 day    Time spent: 35 minutes.    Dana Allan, MD  [facility] Pager #: [phone] 7PM-7AM contact night coverage as above

## 2022-04-23 NOTE — Progress Notes (Signed)
[place] for heparin Indication: DVT  Allergies  Allergen Reactions   Ditropan [Oxybutynin] Swelling and Other (See Comments)   Quinine Other (See Comments)    Platelets dropped- consumptive coagulopathy     Vibra-Tab [Doxycycline] Other (See Comments)    Epistaxis     Voltaren [Diclofenac] Swelling and Other (See Comments)    Elevated liver enzymes Lip swelling    Zoloft [Sertraline] Other (See Comments)    Insomnia Acute urinary retention   Cymbalta [Duloxetine Hcl] Other (See Comments)    Night sweats   Morphine And Related Nausea And Vomiting, Swelling and Other (See Comments)    Facial and lip swelling OK to use IR form of hydrocodone and oxycodone   Oxycontin [Oxycodone Hcl] Swelling and Other (See Comments)    Lip swelling OK to use IR form   Tape Other (See Comments)    Skin tears easily  SKIN IS FRAGILE- CERTAIN BANDAGES TEAR OFF THE SKIN, "BANDAIDS ARE OKAY."   Voltaren [Diclofenac Sodium] Other (See Comments)    Elevated liver enzymes   Zohydro Er [Hydrocodone Bitartrate Er] Swelling    Facial swelling OK to use IR form   Fentanyl Nausea And Vomiting and Other (See Comments)    Nausea, vomiting ad headaches   Neurontin [Gabapentin] Other (See Comments)    Unknown reaction     Patient Measurements: Height: '5\' 11"'$  (180.3 cm) Weight: 119.3 kg (263 lb) IBW/kg (Calculated) : 75.3 Heparin Dosing Weight: 101.7 kg  Vital Signs: Temp: 97.7 F (36.5 C) (03/03 0458) Temp Source: Oral (03/03 0458) BP: 151/71 (03/03 0458) Pulse Rate: 71 (03/03 0458)  Labs: Recent Labs    04/21/22 1715 04/21/22 1715 04/22/22 0525 04/22/22 0905 04/22/22 1649 04/23/22 0517  HGB 11.3*  --  11.2*  --   --  11.4*  HCT 35.9*  --  36.4*  --   --  37.1*  PLT 514*  --  514*  --   --  527*  APTT 55*  --   --   --   --   --   LABPROT 17.6*  --   --   --   --   --   INR 1.5*  --   --   --   --   --   HEPARINUNFRC  --    < > 0.11* 0.53  0.49 0.43  CREATININE 0.85  --  0.87  --   --  0.88   < > = values in this interval not displayed.     Estimated Creatinine Clearance: 92.4 mL/min (by C-G formula based on SCr of 0.88 mg/dL).   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" no pulmonary md, sees dr [name]   Factor V Leiden mutation ([place])    "never had blood clots"   GERD (gastroesophageal reflux disease)    Hiatal hernia    History of kidney stones    multiple kidney stones-not a problem at present   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, pT1 pNX) no further treatment   History of panic attacks    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 RLE [place] + for DVT. Pharmacy consulted for IV heparin dosing. Baseline CBC: Hgb 11.3, Plt 514K. PT/INR 17.6/1.5, aPTT 55 seconds.   Today, 04/23/22:  Heparin level = 0.43 units/mL, remains therapeutic CBC: Hgb 11.4, low/stable. Plt 527K, remains elevated. No bleeding or infusion issues noted per nursing.   Goal of Therapy:  Heparin level 0.3-0.7 units/ml Monitor platelets by anticoagulation protocol: Yes   Plan:  Continue heparin infusion at 1700 units/hr Daily CBC, heparin level Monitor closely for s/sx of bleeding Noted plans for mechanical thrombectomy on Monday   [name], PharmD, BCPS Clinical Pharmacist 04/23/2022 9:49 AM

## 2022-04-24 ENCOUNTER — Inpatient Hospital Stay (HOSPITAL_COMMUNITY): Payer: PPO

## 2022-04-24 DIAGNOSIS — R319 Hematuria, unspecified: Secondary | ICD-10-CM | POA: Diagnosis not present

## 2022-04-24 DIAGNOSIS — D6859 Other primary thrombophilia: Secondary | ICD-10-CM | POA: Diagnosis not present

## 2022-04-24 DIAGNOSIS — R5383 Other fatigue: Secondary | ICD-10-CM | POA: Diagnosis not present

## 2022-04-24 DIAGNOSIS — I82401 Acute embolism and thrombosis of unspecified deep veins of right lower extremity: Secondary | ICD-10-CM | POA: Diagnosis not present

## 2022-04-24 HISTORY — PX: IR US GUIDE VASC ACCESS LEFT: IMG2389

## 2022-04-24 HISTORY — PX: IR VENOCAVAGRAM IVC: IMG678

## 2022-04-24 HISTORY — PX: IR VENO/EXT/UNI RIGHT: IMG676

## 2022-04-24 LAB — HEPARIN LEVEL (UNFRACTIONATED): Heparin Unfractionated: 0.5 IU/mL (ref 0.30–0.70)

## 2022-04-24 LAB — COMPREHENSIVE METABOLIC PANEL
ALT: 13 U/L (ref 0–44)
AST: 16 U/L (ref 15–41)
Albumin: 3.5 g/dL (ref 3.5–5.0)
Alkaline Phosphatase: 66 U/L (ref 38–126)
Anion gap: 6 (ref 5–15)
BUN: 14 mg/dL (ref 8–23)
CO2: 24 mmol/L (ref 22–32)
Calcium: 10 mg/dL (ref 8.9–10.3)
Chloride: 107 mmol/L (ref 98–111)
Creatinine, Ser: 0.96 mg/dL (ref 0.61–1.24)
GFR, Estimated: >60 mL/min (ref >60)
Glucose, Bld: 105 mg/dL — ABNORMAL HIGH (ref 70–99)
Potassium: 3.9 mmol/L (ref 3.5–5.1)
Sodium: 137 mmol/L (ref 135–145)
Total Bilirubin: 0.6 mg/dL (ref 0.3–1.2)
Total Protein: 6.3 g/dL — ABNORMAL LOW (ref 6.5–8.1)

## 2022-04-24 LAB — CBC WITH DIFFERENTIAL/PLATELET
Abs Immature Granulocytes: 0.02 10*3/uL (ref 0.00–0.07)
Basophils Absolute: 0.1 10*3/uL (ref 0.0–0.1)
Basophils Relative: 2 %
Eosinophils Absolute: 0.4 10*3/uL (ref 0.0–0.5)
Eosinophils Relative: 6 %
HCT: 36.2 % — ABNORMAL LOW (ref 39.0–52.0)
Hemoglobin: 11.2 g/dL — ABNORMAL LOW (ref 13.0–17.0)
Immature Granulocytes: 0 %
Lymphocytes Relative: 44 %
Lymphs Abs: 3.4 10*3/uL (ref 0.7–4.0)
MCH: 25.3 pg — ABNORMAL LOW (ref 26.0–34.0)
MCHC: 30.9 g/dL (ref 30.0–36.0)
MCV: 81.7 fL (ref 80.0–100.0)
Monocytes Absolute: 1 10*3/uL (ref 0.1–1.0)
Monocytes Relative: 13 %
Neutro Abs: 2.7 10*3/uL (ref 1.7–7.7)
Neutrophils Relative %: 35 %
Platelets: 496 10*3/uL — ABNORMAL HIGH (ref 150–400)
RBC: 4.43 MIL/uL (ref 4.22–5.81)
RDW: 18.2 % — ABNORMAL HIGH (ref 11.5–15.5)
WBC: 7.7 10*3/uL (ref 4.0–10.5)
nRBC: 0.3 % — ABNORMAL HIGH (ref 0.0–0.2)

## 2022-04-24 LAB — GLUCOSE, CAPILLARY
Glucose-Capillary: 99 mg/dL (ref 70–99)
Glucose-Capillary: 85 mg/dL (ref 70–99)
Glucose-Capillary: 94 mg/dL (ref 70–99)
Glucose-Capillary: 158 mg/dL — ABNORMAL HIGH (ref 70–99)

## 2022-04-24 LAB — HEMOGLOBIN A1C
Hgb A1c MFr Bld: 6.9 % — ABNORMAL HIGH (ref 4.8–5.6)
Mean Plasma Glucose: 151 mg/dL

## 2022-04-24 MED ORDER — IOHEXOL 300 MG/ML  SOLN
100.0000 mL | Freq: Once | INTRAMUSCULAR | Status: AC | PRN
  Administered 2022-04-24: 78 mL via INTRA_ARTERIAL

## 2022-04-24 MED ORDER — MIDAZOLAM HCL 2 MG/2ML IJ SOLN
INTRAMUSCULAR | Status: AC | PRN
  Administered 2022-04-24: .5 mg via INTRAVENOUS
  Administered 2022-04-24: 1.5 mg via INTRAVENOUS
  Administered 2022-04-24: .5 mg via INTRAVENOUS
  Administered 2022-04-24 (×2): 1 mg via INTRAVENOUS

## 2022-04-24 MED ORDER — LIDOCAINE HCL 1 % IJ SOLN
INTRAMUSCULAR | Status: AC
  Filled 2022-04-24: qty 20

## 2022-04-24 MED ORDER — FENTANYL CITRATE (PF) 100 MCG/2ML IJ SOLN
INTRAMUSCULAR | Status: AC
  Filled 2022-04-24: qty 2

## 2022-04-24 MED ORDER — HYDROMORPHONE HCL 2 MG/ML IJ SOLN
INTRAMUSCULAR | Status: AC
  Filled 2022-04-24: qty 1

## 2022-04-24 MED ORDER — IOHEXOL 300 MG/ML  SOLN
100.0000 mL | Freq: Once | INTRAMUSCULAR | Status: AC | PRN
  Administered 2022-04-24: 75 mL via INTRA_ARTERIAL

## 2022-04-24 MED ORDER — ONDANSETRON HCL 4 MG/2ML IJ SOLN
INTRAMUSCULAR | Status: AC | PRN
  Administered 2022-04-24: 4 mg via INTRAVENOUS

## 2022-04-24 MED ORDER — ALTEPLASE 2 MG IJ SOLR
2.0000 mg | Freq: Once | INTRAMUSCULAR | Status: DC
  Filled 2022-04-24: qty 2

## 2022-04-24 MED ORDER — MIDAZOLAM HCL 2 MG/2ML IJ SOLN
INTRAMUSCULAR | Status: AC
  Filled 2022-04-24: qty 2

## 2022-04-24 MED ORDER — ONDANSETRON HCL 4 MG/2ML IJ SOLN
INTRAMUSCULAR | Status: AC
  Filled 2022-04-24: qty 2

## 2022-04-24 MED ORDER — LIDOCAINE HCL (PF) 1 % IJ SOLN
INTRAMUSCULAR | Status: AC
  Filled 2022-04-24: qty 30

## 2022-04-24 MED ORDER — FENTANYL CITRATE (PF) 100 MCG/2ML IJ SOLN
INTRAMUSCULAR | Status: AC | PRN
  Administered 2022-04-24: 50 ug via INTRAVENOUS
  Administered 2022-04-24: 75 ug via INTRAVENOUS
  Administered 2022-04-24: 25 ug via INTRAVENOUS
  Administered 2022-04-24 (×2): 50 ug via INTRAVENOUS

## 2022-04-24 MED ORDER — DIPHENHYDRAMINE HCL 50 MG/ML IJ SOLN
INTRAMUSCULAR | Status: AC
  Filled 2022-04-24: qty 1

## 2022-04-24 MED ORDER — HEPARIN SODIUM (PORCINE) 1000 UNIT/ML IJ SOLN
INTRAMUSCULAR | Status: AC
  Filled 2022-04-24: qty 10

## 2022-04-24 MED ORDER — DIPHENHYDRAMINE HCL 50 MG/ML IJ SOLN
INTRAMUSCULAR | Status: AC | PRN
  Administered 2022-04-24: 50 mg via INTRAVENOUS

## 2022-04-24 NOTE — Progress Notes (Signed)
PROGRESS NOTE Derek [name]  [id number] DOB: 06-Sep-1945 DOA: 04/21/2022 PCP: [name], MD   Brief Narrative/Hospital Course: 6 yom w/ factor V Leiden history w/ ,prior Rt DVT s/p IVC filter placement presented with swelling and pain of the right lower extremity.Ultrasound of the lower extremity revealed extensive DVT Apparently, patient was seen by Dr. Aram Beecham who ordered the Doppler ultrasound of the lower extremity.He had failed Eliquis>switched to Pradaxa.He had developed another DVT on Pradaxa.  Patient  was placed on heparin drip. Interventional radiology team has been consulted for possible mechanical thrombectomy, kept N.p.o. 3/3 night for mechanical thrombectomy.   Subjective: Seen and examined RLE swelling much better. Still some pain Overnight no fever On RA hh stable on heparin gtt.  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  On heparin drip, open PRN, IR following with plan for right lower extremity thrombectomy.  Oncology recommending Lovenox subcu and discharged given he failed oral regimen and Coumadin will be difficult.  He has no days of subcu injection as he has used in the past.  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    Benign essential HTN: BP well-controlled on Coreg losartan HLD pravastatin  Chronic back pain: Continue lidocaine patch,  Tylenol pain control.  Previously on Lyrica muscle relaxant opiates but no longer  04/23/2022: Patient reports that lidocaine patches not working.  Patient wants to try P's.  Optimize pain control.   Class II Obesity:Patient's Body mass index is 36.68 kg/m. : Will benefit with PCP follow-up, weight loss  healthy lifestyle and outpatient sleep evaluation.   DVT prophylaxis: heparin gtt Code Status:   Code Status: Full Code Family Communication: plan of care discussed with patient at bedside. Patient status is: inpatient  because of DVT Level of care: Telemetry   Dispo: The patient is from: Home w/ wife            Anticipated disposition: TBD Objective: Vitals last 24 hrs: Vitals:   04/23/22 0458 04/23/22 1348 04/23/22 1950 04/24/22 0432  BP: (!) 151/71 116/70 130/79 126/79  Pulse: 71 70  69  Resp: '20 14 18 18  '$ Temp: 97.7 F (36.5 C) 97.9 F (36.6 C) 97.9 F (36.6 C) 97.8 F (36.6 C)  TempSrc: Oral Oral Oral Oral  SpO2: 95% 96% 98% 99%  Weight:      Height:       Weight change:   Physical Examination: General exam: alert awake, older than stated age HEENT:Oral mucosa moist, Ear/Nose WNL grossly Respiratory system: bilaterally CLEAR BS, no use of accessory muscle Cardiovascular system: S1 & S2 +, No JVD. Gastrointestinal system: Abdomen soft,NT,ND, BS+ Nervous System:Alert, awake, moving extremities. Extremities: LE edema + O Nrle W/ MILD REDNESS,distal peripheral pulses palpable.  Skin: No rashes,no icterus. MSK:  Normal muscle bulk,tone, power  Medications reviewed:  Scheduled Meds:  diphenhydrAMINE  25 mg Oral QHS   And   acetaminophen  500 mg Oral QHS   aspirin EC  81 mg Oral Daily   carvedilol  25 mg Oral QHS   docusate sodium  100 mg Oral BID   Dulaglutide  4.5 mg Subcutaneous Weekly   empagliflozin  25 mg Oral Daily   insulin aspart  0-20 Units Subcutaneous TID WC   insulin aspart  0-5 Units Subcutaneous QHS   insulin glargine-yfgn  50 Units Subcutaneous  Daily   lidocaine  1 patch Transdermal Q24H   loratadine  10 mg Oral QHS   losartan  50 mg Oral QHS   magnesium oxide  800 mg Oral QHS   pantoprazole  40 mg Oral Daily   pravastatin  20 mg Oral Once per day on Mon Thu   Continuous Infusions:  heparin 1,700 Units/hr (04/24/22 [id number])      Diet Order             Diet NPO time specified Except for: Sips with Meds  Diet effective ____                  Intake/Output Summary (Last 24 hours) at 04/24/2022 0948 Last data filed at 04/24/2022 0739 Gross per 24 hour  Intake 579.64 ml  Output 500 ml  Net 79.64 ml   Net IO Since Admission: -1,023.43 mL [04/24/22 0948]  Wt Readings from Last 3 Encounters:  04/21/22 119.3 kg  04/19/22 120.7 kg  01/03/22 121.7 kg     Unresulted Labs (From admission, onward)     Start     Ordered   04/23/22 0500  CBC with Differential/Platelet  Daily,   R     Question:  Specimen collection method  Answer:  Lab=Lab collect   04/22/22 0707   04/23/22 0500  Comprehensive metabolic panel  Daily,   R     Question:  Specimen collection method  Answer:  Lab=Lab collect   04/22/22 0707   04/23/22 0500  Occult blood card to lab, stool  Daily,   R      04/22/22 0707   04/23/22 0500  Heparin level (unfractionated)  Daily,   R     Question:  Specimen collection method  Answer:  Lab=Lab collect   04/22/22 1012   04/21/22 2255  Hemoglobin A1c  Once,   R       Comments: To assess prior glycemic control   Question:  Specimen collection method  Answer:  Lab=Lab collect   04/21/22 2254          Data Reviewed: I have personally reviewed following labs and imaging studies CBC: Recent Labs  Lab 04/19/22 1510 04/21/22 1715 04/22/22 0525 04/23/22 0517 04/24/22 0427  WBC 7.2 7.3 9.2 8.1 7.7  NEUTROABS 2.7 2.9  --  2.7 2.7  HGB 12.2* 11.3* 11.2* 11.4* 11.2*  HCT 39.5 35.9* 36.4* 37.1* 36.2*  MCV 81.8 79.4* 82.0 82.4 81.7  PLT 380 514* 514* 527* [id number]*   Basic Metabolic Panel: Recent Labs  Lab  04/19/22 1510 04/21/22 1715 04/22/22 0525 04/23/22 0517 04/24/22 0427  NA 138 136 139 137 137  K 3.9 3.9 3.6 3.6 3.9  CL 109 111 110 109 107  CO2 21* 21* '22 23 24  '$ GLUCOSE 135* 82 106* 86 105*  BUN '19 16 16 17 14  '$ CREATININE 0.96 0.85 0.87 0.88 0.96  CALCIUM 10.6* 9.4 9.9 10.0  10.0   GFR: Estimated Creatinine Clearance: 84.7 mL/min (by C-G formula based on SCr of 0.96 mg/dL). Liver Function Tests: Recent Labs  Lab 04/19/22 1510 04/23/22 0517 04/24/22 0427  AST 11* 15 16  ALT '10 13 13  '$ ALKPHOS 73 70 66  BILITOT 0.4 0.5 0.6  PROT 6.8 6.7 6.3*  ALBUMIN 4.1 3.5 3.5   Recent Labs  Lab 04/21/22 1715  INR 1.5*   CBG: Recent Labs  Lab 04/23/22 0758 04/23/22 1145 04/23/22 1711 04/23/22 1951 04/24/22 0726  GLUCAP 104* 145* 105* 173* 99   No results found for this or any previous visit (from the past 240 hour(s)).  Antimicrobials: Anti-infectives (From admission, onward)    None      Culture/Microbiology    Component Value Date/Time   SDES  12/10/2020 1655    URINE, RANDOM Performed at [facility], [address] Madelaine Bhat [place], [place] [address]    [facility]  12/10/2020 1655    NONE Performed at [facility], [address] [place], [place] [address]    CULT  12/10/2020 1655    NO GROWTH Performed at [facility], [place] [address], [place], [place] [address]    REPTSTATUS 12/12/2020 FINAL 12/10/2020 1655  Radiology Studies: CT VENOGRAM ABD/PEL  Result Date: 04/22/2022 CLINICAL DATA:  Leg deep vein thrombosis (DVT) suspected concern from caval thrombus. EXAM: CT VENOGRAM ABDOMEN AND PELVIS WITH CONTRAST TECHNIQUE: Multidetector CT imaging of the abdomen and pelvis was performed using the standard protocol during bolus administration of intravenous contrast. Multiplanar reconstructed images and MIPs were obtained and reviewed to evaluate the vascular anatomy. RADIATION DOSE REDUCTION: This exam was performed according to the  departmental dose-optimization program which includes automated exposure control, adjustment of the mA and/or kV according to patient size and/or use of iterative reconstruction technique. CONTRAST:  117m OMNIPAQUE IOHEXOL 350 MG/ML SOLN COMPARISON:  Lower extremity venous duplex, 04/21/2022 and 01/23/2022. CT AP, 10/06/2021 and 03/22/2021. IR fluoroscopy, 10/08/2021. FINDINGS: VASCULAR Suboptimal evaluation, secondary to poor contrast opacification such that emboli could missed. AORTA AND ARTERIAL VASCULATURE: Mild-to-moderate calcified aortoiliac atherosclerotic disease. Normal without aneurysm, dissection, vasculitis or significant stenosis. 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 *Patent IVC common iliac veins, external iliac veins and CFVs bilaterally. *Diminutive appearance of the RIGHT femoral and popliteal veins Review of the MIP images confirms the above findings. NON-VASCULAR Lower chest: Punctate granulomas at the middle lobe. No suspicious pulmonary nodule or mass of the lung bases. Hepatobiliary: No focal liver abnormality is seen. No gallstones, gallbladder wall thickening, or biliary dilatation. Pancreas: Distal pancreatectomy. No pancreatic ductal dilatation or surrounding inflammatory changes. Spleen: Splenectomy. Adrenals/Urinary Tract: Adrenal glands are unremarkable. Kidneys are normal, without renal calculi, focal lesion, or hydronephrosis. Bladder is unremarkable. Stomach/Bowel: Moderate-sized hiatus hernia. No CTV evidence of gastric vascular compromise. Appendix is not definitively visualized. No evidence of bowel wall thickening, distention, or inflammatory changes. Lymphatic: No enlarged abdominal or pelvic lymph nodes. Reproductive: Prostate is unremarkable. Other: Small fat-containing LEFT inguinal hernia versus cord lipoma. Supraumbilical ventral abdominal hernia repair. No abdominopelvic ascites.  Musculoskeletal: Focal anterior abdominal wall contusions, likely injection sites. No acute or significant osseous findings. IMPRESSION: VASCULAR 1. Infrarenal IVC filter with mild medial tilt at its hub. Patent IVC and iliac and external iliac veins bilaterally. 2. Diminutive appearance of the RIGHT femoral and popliteal veins, favored consistent with chronic/post thrombotic change. NON-VASCULAR 1. No  acute abdominopelvic process. 2. Hiatus hernia.  No CT evidence of gastric vascular compromise. 3. Aortic Atherosclerosis (ICD10-I70.0). Additional incidental, chronic and senescent findings as above. Michaelle Birks, MD [facility] Electronically Signed   By: Michaelle Birks M.D.   On: 04/22/2022 21:50     LOS: 2 days   Antonieta Pert, MD [facility]  04/24/2022, 9:48 AM

## 2022-04-24 NOTE — Procedures (Signed)
Vascular and Interventional Radiology Procedure Note  Patient: Derek PREVOT Sr. DOB: Nov 02, 1945 Medical Record Number: [id number] Note Date/Time: 04/24/22 3:46 PM   Performing Physician: Michaelle Birks, MD Assistant(s): None  Diagnosis: Hx of factor V Leiden. New, extensive and symptomatic RLE DVT. Prior anterograde MT in 10/08/21.   Procedure:  RIGHT LOWER EXTREMITY VENOGRAM PELVIC AND INFERIOR VENACAVOGRAM  Anesthesia: Conscious Sedation Complications: None Estimated Blood Loss: Minimal Specimens: Pathology  Findings:  - access via the LEFT  greater saphenous  vein. - Patent IVC, R pelvic and R common femoral veins - post thrombotic venous change and occlusive RLE femoral DVT - could not access the R SFV peripheral to the mid thigh, despite considerable effort - No thrombectomy was performed.  Plan: - Post sheath removal precautions. Bedrest with RLE straight x2hrs. - Purse string suture at R groin. Will be removed by VIR service on rounds in AM.  Final report to follow once all images are reviewed and compared with previous studies.  See detailed dictation with images in PACS. The patient tolerated the procedure well without incident or complication and was returned to Floor Bed in stable condition.    Michaelle Birks, MD [facility]   Pager. [place]

## 2022-04-24 NOTE — Sedation Documentation (Signed)
O2 increased to 4L

## 2022-04-24 NOTE — Progress Notes (Signed)
Looks like Mr. Mcdevitt will have a right lower extremity thrombectomy today.  He is on heparin drip.  The right leg does look better.  He says the swelling is going down.  There is still some pain.  There was a CT venogram that was done over the weekend.  This showed that the IVC filter was patent.  He continues his heparin drip.  There is no bleeding.  His CBC shows white cell count 7.7.  Hemoglobin 9.2.  Platelet count 496,000.  His calcium is 10 with an albumin of 3.5.  The BUN is 14 creatinine 0.96.  He is eating well.  He has had no problems with cough or shortness of breath.  There is been no problems with nausea or vomiting.  I think he did get some iron over the weekend because of a very low iron saturation and ferritin done on 04/19/2022.  Vital signs show temperature 97.8.  Pulse 69.  Blood pressure 126/79.  His lungs sound clear bilaterally.  Cardiac exam regular rate and rhythm.  He has no murmurs.  Abdomen is soft.  Bowel sounds are present.  There is no fluid wave.  There is no guarding or rebound tenderness.  Extremity shows no erythema of the right leg.  There is still a little bit of swelling.  The leg is not tight.  Neurological exam is nonfocal.  I am so grateful for the wonderful care that he is getting on 4 E.  I know that IR will do a wonderful job today trying to remove the thrombus.  Unfortunately, he is going to need injectable anticoagulation at this point.  I just do not think that Coumadin is can be easy to manage at all with him given all of his problems and his medications.  For now, he will just continue on the heparin.    Lattie Haw, MD  [place] 14:14

## 2022-04-24 NOTE — Progress Notes (Signed)
[place] for heparin Indication: DVT  Allergies  Allergen Reactions   Ditropan [Oxybutynin] Swelling and Other (See Comments)   Quinine Other (See Comments)    Platelets dropped- consumptive coagulopathy     Vibra-Tab [Doxycycline] Other (See Comments)    Epistaxis     Voltaren [Diclofenac] Swelling and Other (See Comments)    Elevated liver enzymes Lip swelling    Zoloft [Sertraline] Other (See Comments)    Insomnia Acute urinary retention   Cymbalta [Duloxetine Hcl] Other (See Comments)    Night sweats   Morphine And Related Nausea And Vomiting, Swelling and Other (See Comments)    Facial and lip swelling OK to use IR form of hydrocodone and oxycodone   Oxycontin [Oxycodone Hcl] Swelling and Other (See Comments)    Lip swelling OK to use IR form   Tape Other (See Comments)    Skin tears easily  SKIN IS FRAGILE- CERTAIN BANDAGES TEAR OFF THE SKIN, "BANDAIDS ARE OKAY."   Voltaren [Diclofenac Sodium] Other (See Comments)    Elevated liver enzymes   Zohydro Er [Hydrocodone Bitartrate Er] Swelling    Facial swelling OK to use IR form   Fentanyl Nausea And Vomiting and Other (See Comments)    Nausea, vomiting ad headaches   Neurontin [Gabapentin] Other (See Comments)    Unknown reaction     Patient Measurements: Height: '5\' 11"'$  (180.3 cm) Weight: 119.3 kg (263 lb) IBW/kg (Calculated) : 75.3 Heparin Dosing Weight: 101.7 kg  Vital Signs: Temp: 97.8 F (36.6 C) (03/04 0432) Temp Source: Oral (03/04 0432) BP: 126/79 (03/04 0432) Pulse Rate: 69 (03/04 0432)  Labs: Recent Labs    04/21/22 1715 04/22/22 0525 04/22/22 0905 04/22/22 1649 04/23/22 0517 04/24/22 0427  HGB 11.3* 11.2*  --   --  11.4* 11.2*  HCT 35.9* 36.4*  --   --  37.1* 36.2*  PLT 514* 514*  --   --  527* 496*  APTT 55*  --   --   --   --   --   LABPROT 17.6*  --   --   --   --   --   INR 1.5*  --   --   --   --   --   HEPARINUNFRC  --  0.11*   < > 0.49  0.43 0.50  CREATININE 0.85 0.87  --   --  0.88 0.96   < > = values in this interval not displayed.     Estimated Creatinine Clearance: 84.7 mL/min (by C-G formula based on SCr of 0.96 mg/dL).   Assessment: 89 y/oM with PMH of recurrent DVT s/p thrombectomy 09/2021, factor V leiden, IVC filter on Dabigatran '150mg'$  PO BID  PTA (last dose 04/21/22 at 1200) presenting with RLE swelling/pain. RLE [place] + for DVT. Pharmacy consulted for IV heparin dosing. Baseline CBC: Hgb 11.3, Plt 514K. PT/INR 17.6/1.5, aPTT 55 seconds.   Today, 04/24/22:  Heparin level = 0.5 units/mL, remains therapeutic CBC: Hgb 11.2, low/stable. Plt 496K, remains elevated. No bleeding or infusion issues noted per nursing/MD.   Goal of Therapy:  Heparin level 0.3-0.7 units/ml Monitor platelets by anticoagulation protocol: Yes   Plan:  Continue heparin infusion at 1700 units/hr Daily CBC, heparin level Monitor closely for s/sx of bleeding Noted plans for mechanical thrombectomy today, no need to hold heparin prior per IR   Peggyann Juba, PharmD, BCPS Pharmacy: [phone] 04/24/2022 6:57 AM

## 2022-04-24 NOTE — Hospital Course (Addendum)
78 yom w/ factor V Leiden history w/ ,prior Rt DVT s/p IVC filter placement presented with swelling and pain of the right lower extremity.Ultrasound of the lower extremity revealed extensive DVT Apparently, patient was seen by Dr. Aram Beecham who ordered the Doppler ultrasound of the lower extremity.He had failed Eliquis>switched to Pradaxa.He had developed another DVT on Pradaxa.  Patient  was placed on heparin drip. Interventional radiology team has been consulted for possible mechanical thrombectomy, kept N.p.o. 3/3 night for mechanical thrombectomy. On 3/4: uderwent right lower extremity venogram [facility].  [place] > access via the LEFT  greater saphenous  vein.Patent IVC, R pelvic and R common femoral veins, - post thrombotic venous change and occlusive RLE femoral DVT- could not access the R SFV peripheral to the mid thigh, despite considerable effort- No thrombectomy was performed. Oncology advised extra anticoagulation as outpatient as well.  Due to high co-pay unable to afford Arixtra so we switched to Lovenox after discussion with Dr. [name] and wife, he remains medically stable for discharge home today

## 2022-04-25 ENCOUNTER — Other Ambulatory Visit (HOSPITAL_BASED_OUTPATIENT_CLINIC_OR_DEPARTMENT_OTHER): Payer: Self-pay

## 2022-04-25 ENCOUNTER — Other Ambulatory Visit (HOSPITAL_COMMUNITY): Payer: Self-pay

## 2022-04-25 DIAGNOSIS — I82401 Acute embolism and thrombosis of unspecified deep veins of right lower extremity: Secondary | ICD-10-CM | POA: Diagnosis not present

## 2022-04-25 LAB — COMPREHENSIVE METABOLIC PANEL
ALT: 15 U/L (ref 0–44)
AST: 20 U/L (ref 15–41)
Albumin: 3.5 g/dL (ref 3.5–5.0)
Alkaline Phosphatase: 70 U/L (ref 38–126)
Anion gap: 7 (ref 5–15)
BUN: 17 mg/dL (ref 8–23)
CO2: 24 mmol/L (ref 22–32)
Calcium: 10.1 mg/dL (ref 8.9–10.3)
Chloride: 105 mmol/L (ref 98–111)
Creatinine, Ser: 0.98 mg/dL (ref 0.61–1.24)
GFR, Estimated: >60 mL/min (ref >60)
Glucose, Bld: 97 mg/dL (ref 70–99)
Potassium: 4.4 mmol/L (ref 3.5–5.1)
Sodium: 136 mmol/L (ref 135–145)
Total Bilirubin: 0.4 mg/dL (ref 0.3–1.2)
Total Protein: 6.6 g/dL (ref 6.5–8.1)

## 2022-04-25 LAB — CBC WITH DIFFERENTIAL/PLATELET
Abs Immature Granulocytes: 0.03 10*3/uL (ref 0.00–0.07)
Basophils Absolute: 0.1 10*3/uL (ref 0.0–0.1)
Basophils Relative: 1 %
Eosinophils Absolute: 0.5 10*3/uL (ref 0.0–0.5)
Eosinophils Relative: 5 %
HCT: 37.1 % — ABNORMAL LOW (ref 39.0–52.0)
Hemoglobin: 11.2 g/dL — ABNORMAL LOW (ref 13.0–17.0)
Immature Granulocytes: 0 %
Lymphocytes Relative: 37 %
Lymphs Abs: 3.1 10*3/uL (ref 0.7–4.0)
MCH: 24.9 pg — ABNORMAL LOW (ref 26.0–34.0)
MCHC: 30.2 g/dL (ref 30.0–36.0)
MCV: 82.6 fL (ref 80.0–100.0)
Monocytes Absolute: 1.1 10*3/uL — ABNORMAL HIGH (ref 0.1–1.0)
Monocytes Relative: 13 %
Neutro Abs: 3.8 10*3/uL (ref 1.7–7.7)
Neutrophils Relative %: 44 %
Platelets: 462 10*3/uL — ABNORMAL HIGH (ref 150–400)
RBC: 4.49 MIL/uL (ref 4.22–5.81)
RDW: 18.9 % — ABNORMAL HIGH (ref 11.5–15.5)
WBC: 8.6 10*3/uL (ref 4.0–10.5)
nRBC: 0.2 % (ref 0.0–0.2)

## 2022-04-25 LAB — GLUCOSE, CAPILLARY
Glucose-Capillary: 155 mg/dL — ABNORMAL HIGH (ref 70–99)
Glucose-Capillary: 167 mg/dL — ABNORMAL HIGH (ref 70–99)

## 2022-04-25 LAB — HEPARIN LEVEL (UNFRACTIONATED): Heparin Unfractionated: 0.3 IU/mL (ref 0.30–0.70)

## 2022-04-25 MED ORDER — LIDOCAINE 5 % EX PTCH
1.0000 | MEDICATED_PATCH | CUTANEOUS | 0 refills | Status: DC
  Filled 2022-04-25: qty 30, 30d supply, fill #0

## 2022-04-25 MED ORDER — FONDAPARINUX SODIUM 10 MG/0.8ML ~~LOC~~ SOLN
10.0000 mg | SUBCUTANEOUS | Status: DC
  Administered 2022-04-25: 10 mg via SUBCUTANEOUS
  Filled 2022-04-25: qty 0.8

## 2022-04-25 MED ORDER — ENOXAPARIN SODIUM 120 MG/0.8ML IJ SOSY: 120.0000 mg | PREFILLED_SYRINGE | Freq: Two times a day (BID) | INTRAMUSCULAR | Status: DC

## 2022-04-25 MED ORDER — ENOXAPARIN SODIUM 120 MG/0.8ML IJ SOSY
120.0000 mg | PREFILLED_SYRINGE | Freq: Two times a day (BID) | INTRAMUSCULAR | 0 refills | Status: DC
  Filled 2022-04-25: qty 48, 30d supply, fill #0

## 2022-04-25 NOTE — Progress Notes (Signed)
Patient to be discharged to home this afternoon. Patient and Patient's Wife given discharge instructions including all discharge Medications and schedules for these Medications, Understanding verbalized and discharge AVS with the Patient at time of discharge

## 2022-04-25 NOTE — TOC Benefit Eligibility Note (Addendum)
Patient Teacher, English as a foreign language completed.    The patient is currently admitted and upon discharge could be taking fondaparinux (Arixtra) 10 mg/0.8 ml soln injection.  The current 30 day co-pay is $739.46.   The patient is currently admitted and upon discharge could be taking enoxaparin (Lovenox) 100 mg/ml soln injection.  The current 30 day co-pay is $100.00.   The patient is insured through [place], [place] Patient Advocate Specialist [place] Patient Advocate Team Direct Number: [phone]  Fax: [phone]

## 2022-04-25 NOTE — Progress Notes (Signed)
Unfortunately, IR cannot do a thrombectomy yesterday.  I know they tried incredibly hard.  As always, they really go the extra mile to try to help our patients.  Given the fact that no thrombectomy can be done, we need to get him on an outpatient protocol.  I will try him on Arixtra.  I really think this would be a reasonable approach.  I have slowly have no confidence in Coumadin right now.  Try to manage Coumadin and him would be very difficult given all of his medications.  We will have him on Arixtra 10 mg subcu daily.  Hopefully, his insurance will cover this as an outpatient.  He says the leg is feeling a little bit better.  It is not swollen.  He has had no chest wall pain.  There is been no cough.  He has had no shortness of breath.  There is been no nausea or vomiting.  I am not sure how much she is walking right now.  If he walks, he needs a rolling walker because of his back.  His labs show sodium 136.  Potassium 4.4.  BUN 17 creatinine 0.98.  His calcium is 10.1 with an albumin of 3.5.  The white cell count is 8.6.  Hemoglobin 11.2.  Platelet count 462,000.  His vital signs show temperature of 97.9.  Pulse 68.  Blood pressure 131/71.  Oxygen saturation 97%.  His head neck exam shows no ocular or oral lesions.  There are no palpable cervical or supraclavicular lymph nodes.  Lungs are clear bilaterally.  He has good air movement bilaterally.  Cardiac exam regular rate and rhythm.  He has no murmurs.  Abdomen is soft.  Abdomen is obese.  Bowel sounds are present.  Extremities does show little bit of swelling in the right leg.  No venous cord is noted.  He has a negative Homans' sign.  He has decent pulses.  At this point, it is a matter of getting Derek [name] on Arixtra and make sure that this is covered as an outpatient and make sure that he can get it as an outpatient.  I do appreciate everybody's help on 4 E.  Lattie Haw, MD  [place] 12:12

## 2022-04-25 NOTE — Progress Notes (Signed)
Referring Physician(s): Dr. Alcario Drought  Supervising Physician: Michaelle Birks  Patient Status:  [name] - In-pt  Chief Complaint:  RLE DVT s/p right lower extremity venogram with attempted thrombectomy  Subjective: Patient seen in IR nurse's station. He is accompanied by his wife. He denies any pain or discomfort and is able to ambulate unassisted.   Allergies: Ditropan [oxybutynin], Quinine, Vibra-tab [doxycycline], Voltaren [diclofenac], Zoloft [sertraline], Cymbalta [duloxetine hcl], Morphine and related, Oxycontin [oxycodone hcl], Tape, Voltaren [diclofenac sodium], Zohydro er [hydrocodone bitartrate er], Fentanyl, and Neurontin [gabapentin]  Medications: Prior to Admission medications   Medication Sig Start Date End Date Taking? Authorizing Provider  acetaminophen (TYLENOL) 500 MG tablet Take 500 mg by mouth every 6 (six) hours as needed for mild pain or headache.   Yes [provider]  aspirin EC 81 MG tablet Take 81 mg by mouth daily. Swallow whole.   Yes [provider]  carvedilol (COREG) 25 MG tablet Take 1 tablet by mouth once a day every evening 07/08/21  Yes   dabigatran (PRADAXA) 150 MG CAPS capsule Take 1 capsule (150 mg total) by mouth 2 (two) times daily. 04/19/22  Yes Ennever, Rudell Cobb, MD  diphenhydramine-acetaminophen (TYLENOL PM) 25-500 MG TABS tablet Take 1 tablet by mouth at bedtime.   Yes [provider]  docusate sodium (COLACE) 100 MG capsule Take 100 mg by mouth 2 (two) times daily.   Yes [provider]  Dulaglutide (TRULICITY) 4.5 0000000 SOPN Inject 4.5 mg into the skin once a week. 03/10/22  Yes   empagliflozin (JARDIANCE) 25 MG TABS tablet Take 1 tablet (25 mg total) by mouth daily. 03/10/22  Yes   fluticasone (FLONASE) 50 MCG/ACT nasal spray Place 2 sprays into both nostrils daily. Patient taking differently: Place 1-2 sprays into both nostrils daily as needed for allergies. 02/21/22  Yes   furosemide (LASIX) 20 MG tablet Take 20  mg by mouth daily as needed for edema.   Yes [provider]  Insulin Degludec FlexTouch 100 UNIT/ML SOPN Inject 70 Units into the skin daily. 03/10/22  Yes   lansoprazole (PREVACID) 30 MG capsule Take 1 capsule (30 mg total) by mouth daily before a meal. 04/05/22  Yes   levocetirizine (XYZAL) 5 MG tablet Take 5 mg by mouth every evening.   Yes [provider]  losartan (COZAAR) 50 MG tablet Take 1 tablet by mouth once every evening 07/08/21  Yes   magnesium oxide (MAG-OX) 400 MG tablet Take 800 mg by mouth at bedtime.   Yes [provider]  nystatin (MYCOSTATIN) 100000 UNIT/ML suspension Use 4-5 mL for Mouth/Throat as directed four times a day Patient taking differently: Take 5 mLs by mouth 4 (four) times daily as needed (thrush). 07/08/21  Yes   ondansetron (ZOFRAN-ODT) 4 MG disintegrating tablet Take 1 tablet (4 mg total) by mouth every 8 (eight) hours as needed for nausea or vomiting. 10/10/21  Yes Amin, Ankit Chirag, MD  pravastatin (PRAVACHOL) 20 MG tablet Take 1 tablet (20 mg total) by mouth 2 (two) times a week. Patient taking differently: Take 20 mg by mouth 2 (two) times a week. Take on Monday and Friday Evenings 03/16/22  Yes   pregabalin (LYRICA) 50 MG capsule Take 1-2 capsules (50-100 mg total) by mouth at bedtime. Patient taking differently: Take 50 mg by mouth at bedtime. 03/09/22  Yes      Vital Signs: BP 131/71 (BP Location: Left Arm)   Pulse 68   Temp 97.9 F (36.6 C) (  Oral)   Resp 18   Ht '5\' 11"'$  (1.803 m)   Wt 263 lb (119.3 kg)   SpO2 97%   BMI 36.68 kg/m   Physical Exam Constitutional:      General: He is not in acute distress.    Appearance: He is not ill-appearing.  HENT:     Mouth/Throat:     Mouth: Mucous membranes are moist.     Pharynx: Oropharynx is clear.  Cardiovascular:     Comments: Left groin venous purse-string suture. Site it clean, dry, intact and non-tender.  Pulmonary:     Effort: Pulmonary effort is normal.  Abdominal:      Palpations: Abdomen is soft.     Tenderness: There is no abdominal tenderness.  Skin:    General: Skin is warm and dry.  Neurological:     Mental Status: He is alert and oriented to person, place, and time.  Psychiatric:        Mood and Affect: Mood normal.        Behavior: Behavior normal.        Thought Content: Thought content normal.        Judgment: Judgment normal.    Imaging: DG Shoulder Right Port  Result Date: 04/24/2022 CLINICAL DATA:  [id number], postoperative right shoulder pain. EXAM: RIGHT SHOULDER - 1 VIEW COMPARISON:  None Available. FINDINGS: There is no evidence of fracture or dislocation. Soft tissues show no obvious acute findings. There is mild osteopenia. There is a suture anchor in the upper lateral humeral head. Mild spurring noted AC joint. There are soft tissue calcifications along the posterior humeral cuff insertion which could be due to calcific tendinopathy or calcific tendinitis. There are overlying monitor wires. IMPRESSION: 1. No evidence of fracture or dislocation. 2. Soft tissue calcifications along the posterior humeral cuff insertion which could be due to calcific tendinopathy or calcific tendinitis. Electronically Signed   By: Telford Nab M.D.   On: 04/24/2022 20:23   CT VENOGRAM ABD/PEL  Result Date: 04/22/2022 CLINICAL DATA:  Leg deep vein thrombosis (DVT) suspected concern from caval thrombus. EXAM: CT VENOGRAM ABDOMEN AND PELVIS WITH CONTRAST TECHNIQUE: Multidetector CT imaging of the abdomen and pelvis was performed using the standard protocol during bolus administration of intravenous contrast. Multiplanar reconstructed images and MIPs were obtained and reviewed to evaluate the vascular anatomy. RADIATION DOSE REDUCTION: This exam was performed according to the departmental dose-optimization program which includes automated exposure control, adjustment of the mA and/or kV according to patient size and/or use of iterative reconstruction technique.  CONTRAST:  124m OMNIPAQUE IOHEXOL 350 MG/ML SOLN COMPARISON:  Lower extremity venous duplex, 04/21/2022 and 01/23/2022. CT AP, 10/06/2021 and 03/22/2021. IR fluoroscopy, 10/08/2021. FINDINGS: VASCULAR Suboptimal evaluation, secondary to poor contrast opacification such that emboli could missed. AORTA AND ARTERIAL VASCULATURE: Mild-to-moderate calcified aortoiliac atherosclerotic disease. Normal without aneurysm, dissection, vasculitis or significant stenosis. 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 *Patent IVC common iliac veins, external iliac veins and CFVs bilaterally. *Diminutive appearance of the RIGHT femoral and popliteal veins Review of the MIP images confirms the above findings. NON-VASCULAR Lower chest: Punctate granulomas at the middle lobe. No suspicious pulmonary nodule or mass of the lung bases. Hepatobiliary: No focal liver abnormality is seen. No gallstones, gallbladder wall thickening, or biliary dilatation. Pancreas: Distal pancreatectomy. No pancreatic ductal dilatation or surrounding inflammatory changes. Spleen: Splenectomy. Adrenals/Urinary Tract: Adrenal glands are unremarkable. Kidneys are normal, without renal calculi,  focal lesion, or hydronephrosis. Bladder is unremarkable. Stomach/Bowel: Moderate-sized hiatus hernia. No CTV evidence of gastric vascular compromise. Appendix is not definitively visualized. No evidence of bowel wall thickening, distention, or inflammatory changes. Lymphatic: No enlarged abdominal or pelvic lymph nodes. Reproductive: Prostate is unremarkable. Other: Small fat-containing LEFT inguinal hernia versus cord lipoma. Supraumbilical ventral abdominal hernia repair. No abdominopelvic ascites. Musculoskeletal: Focal anterior abdominal wall contusions, likely injection sites. No acute or significant osseous findings. IMPRESSION: VASCULAR 1. Infrarenal IVC filter with mild medial  tilt at its hub. Patent IVC and iliac and external iliac veins bilaterally. 2. Diminutive appearance of the RIGHT femoral and popliteal veins, favored consistent with chronic/post thrombotic change. NON-VASCULAR 1. No acute abdominopelvic process. 2. Hiatus hernia.  No CT evidence of gastric vascular compromise. 3. Aortic Atherosclerosis (ICD10-I70.0). Additional incidental, chronic and senescent findings as above. Michaelle Birks, MD [name] Electronically Signed   By: Michaelle Birks M.D.   On: 04/22/2022 21:50   US Venous Img Lower Unilateral Right (DVT)  Result Date: 04/21/2022 CLINICAL DATA:  Right lower extremity pain and swelling. EXAM: Right LOWER EXTREMITY VENOUS DOPPLER ULTRASOUND TECHNIQUE: Gray-scale sonography with graded compression, as well as color Doppler and duplex ultrasound were performed to evaluate the lower extremity deep venous systems from the level of the common femoral vein and including the common femoral, femoral, profunda femoral, popliteal and calf veins including the posterior tibial, peroneal and gastrocnemius veins when visible. The superficial great saphenous vein was also interrogated. Spectral Doppler was utilized to evaluate flow at rest and with distal augmentation maneuvers in the common femoral, femoral and popliteal veins. COMPARISON:  January 23, 2022. FINDINGS: Contralateral Common Femoral Vein: Respiratory phasicity is normal and symmetric with the symptomatic side. No evidence of thrombus. Normal compressibility. Common Femoral Vein: No evidence of thrombus. Normal compressibility, respiratory phasicity and response to augmentation. Saphenofemoral Junction: No evidence of thrombus. Normal compressibility and flow on color Doppler imaging. Profunda Femoral Vein: Noncompressible with no flow consistent with occlusive thrombus. Femoral Vein: Noncompressible with no flow consistent with occlusive thrombus. Popliteal  Vein: Noncompressible with no flow consistent with occlusive thrombus. Calf Veins: No evidence of thrombus. Normal compressibility and flow on color Doppler imaging. Superficial Great Saphenous Vein: No evidence of thrombus. Normal compressibility. Venous Reflux:  None. Other Findings: 1.2 cm enlarged lymph node is noted in right groin region with fatty hilum. IMPRESSION: Occlusive deep venous thrombosis is noted in the right profundus femoral, superficial femoral and popliteal veins. 1.2 cm enlarged lymph node is noted in right groin region which most likely is reactive or inflammatory in etiology. Follow-up ultrasound in 2-3 months is recommended to ensure resolution or stability. Electronically Signed   By: Marijo Conception M.D.   On: 04/21/2022 15:44    Labs:  CBC: Recent Labs    04/22/22 0525 04/23/22 0517 04/24/22 0427 04/25/22 0505  WBC 9.2 8.1 7.7 8.6  HGB 11.2* 11.4* 11.2* 11.2*  HCT 36.4* 37.1* 36.2* 37.1*  PLT 514* 527* 496* 462*    COAGS: Recent Labs    10/06/21 1330 10/07/21 0223 10/08/21 0245 10/09/21 0357 10/09/21 1455 04/21/22 1715  INR 1.3*  --   --   --   --  1.5*  APTT 35   < > 100* 107* 78* 55*   < > = values in this interval not displayed.    BMP: Recent Labs    04/22/22 0525 04/23/22 0517 04/24/22 0427 04/25/22 0505  NA 139 137  137 136  K 3.6 3.6 3.9 4.4  CL 110 109 107 105  CO2 '22 23 24 24  '$ GLUCOSE 106* 86 105* 97  BUN '16 17 14 17  '$ CALCIUM 9.9 10.0 10.0 10.1  CREATININE 0.87 0.88 0.96 0.98  GFRNONAA >60 >60 >60 >60    LIVER FUNCTION TESTS: Recent Labs    04/19/22 1510 04/23/22 0517 04/24/22 0427 04/25/22 0505  BILITOT 0.4 0.5 0.6 0.4  AST 11* '15 16 20  '$ ALT '10 13 13 15  '$ ALKPHOS 73 70 66 70  PROT 6.8 6.7 6.3* 6.6  ALBUMIN 4.1 3.5 3.5 3.5    Assessment and Plan:  RLE DVT s/p right lower extremity venogram with attempted thrombectomy  Patient is doing well today and has been discharged home. He was discharged prior to purse-string  suture removal but patient was not far from the [name] and was able to return to [facility]. He was evaluated in the IR nurse's station. Left groin site is clean, soft, dry and non-tender. Purse stitch suture removed. Scant bleeding at the site post-removal. Site covered with gauze and tape. Patient instructed to leave dressing on until tomorrow and to call IR if they have any issues with the site such as continued bleeding, pain, erythema, etc.   Mr. Geen will follow up with Dr. Maryelizabeth Kaufmann in one month for a lower extremity duplex study followed by an evaluation with Dr. Maryelizabeth Kaufmann. The patient and his wife know that a scheduler will call them with a date/time of their appointment and they can call IR any time with questions prior to their appointment.   Electronically Signed: Soyla Dryer, AGACNP-BC [phone] 04/25/2022, 8:54 AM   I spent a total of 15 Minutes at the the patient's bedside AND on the patient's [name] floor or unit, greater than 50% of which was counseling/coordinating care s/p RLE venogram with attempted thrombectomy.

## 2022-04-25 NOTE — TOC Transition Note (Signed)
Transition of Care [facility]) - CM/SW Discharge Note   Patient Details  Name: Derek CARICO Sr. MRN: [id number] Date of Birth: Jul 03, 1945  [facility]) CM/SW Contact:  Dessa Phi, RN Phone Number: 04/25/2022, 12:30 PM   Clinical Narrative:   d/c home. Spoke to spouse-patient aleady has otpt pcp office makes calls to f/u;also insurance has nsg that makes calls & home visits. [facility]. No further CM needs.    Final next level of care: Home/Self Care Barriers to Discharge: No Barriers Identified   Patient Goals and CMS Choice      Discharge Placement                         Discharge Plan and Services Additional resources added to the After Visit Summary for                                       Social Determinants of Health (SDOH) Interventions SDOH Screenings   Food Insecurity: No Food Insecurity (04/22/2022)  Housing: Low Risk  (04/22/2022)  Transportation Needs: No Transportation Needs (04/22/2022)  Utilities: Not At Risk (04/22/2022)  Depression (PHQ2-9): Low Risk  (12/22/2019)  Tobacco Use: Medium Risk (04/23/2022)     Readmission Risk Interventions     No data to display

## 2022-04-25 NOTE — Discharge Summary (Signed)
Physician Discharge Summary  Derek [name]. [id number] DOB: 16-Nov-1945 DOA: 04/21/2022  PCP: [name], MD  Admit date: 04/21/2022 Discharge date: 04/25/2022 Recommendations for Outpatient Follow-up:  Follow up with PCP in 1 weeks-call for appointment Please obtain BMP/CBC in one week 1.2 cm lymph node in right groin noted so needs repeat US in 2-3 months to make sure it is resolved Discharge Dispo: Home Discharge Condition: Stable Code Status:   Code Status: Full Code Diet recommendation:  Diet Order             Diet Carb Modified Fluid consistency: Thin; Room service appropriate? Yes  Diet effective now                    Brief/Interim Summary: 64 yom w/ factor V Leiden history w/ ,prior Rt DVT s/p IVC filter placement presented with swelling and pain of the right lower extremity.Ultrasound of the lower extremity revealed extensive DVT Apparently, patient was seen by Dr. Aram Beecham who ordered the Doppler ultrasound of the lower extremity.He had failed Eliquis>switched to Pradaxa.He had developed another DVT on Pradaxa.  Patient  was placed on heparin drip. Interventional radiology team has been consulted for possible mechanical thrombectomy, kept N.p.o. 3/3 night for mechanical thrombectomy. On 3/4: uderwent right lower extremity venogram [facility].  Milligram > access via the LEFT  greater saphenous  vein.Patent IVC, R pelvic and R common femoral veins, - post thrombotic venous change and occlusive RLE femoral DVT- could not access the R SFV peripheral to the mid thigh, despite considerable effort- No thrombectomy was performed. Oncology advised extra anticoagulation as outpatient as well.  Due to high co-pay unable to afford Arixtra so we switched to Lovenox after discussion with Dr. 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  On heparin drip> On 3/4: uderwent right lower extremity venogram [facility].  Milligram > access via the LEFT  greater saphenous  vein.Patent IVC, R pelvic and R common femoral veins, - post thrombotic venous change and occlusive RLE femoral DVT- could not access the R SFV peripheral to the mid thigh, despite considerable effort- No thrombectomy was performed. Oncology advised extra anticoagulation as outpatient as well.  Due to high co-pay unable to afford Arixtra so we switched to Lovenox after discussion with Dr. [name] and wife, he remains medically stable for discharge home today.  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  Previously on Lyrica muscle relaxant opiates but no longer Class II Obesity:Patient's Body mass  index is 36.68 kg/m. : Will benefit with PCP follow-up, weight loss  healthy lifestyle and outpatient sleep evaluation  Consults: Oncology Subjective: Alert awake oriented no more leg swelling or pain.  Feels improved and feels ready for discharge home today  Discharge Exam: Vitals:   04/24/22 2000 04/25/22  0508  BP: 126/82 131/71  Pulse: 82 68  Resp:  18  Temp: 97.8 F (36.6 C) 97.9 F (36.6 C)  SpO2: 96% 97%   General: Pt is alert, awake, not in acute distress Cardiovascular: RRR, S1/S2 +, no rubs, no gallops Respiratory: CTA bilaterally, no wheezing, no rhonchi Abdominal: Soft, NT, ND, bowel sounds + Extremities: no edema, no cyanosis  Discharge Instructions  Discharge Instructions     Discharge instructions   Complete by: As directed    Follow-up with your primary care doctor and Dr. [name]  1.2 cm lymph node in right groin noted so needs repeat US in 2-3 months to make sure it is resolved   Please call call MD or return to ER for similar or worsening recurring problem that brought you to hospital or if any fever,nausea/vomiting,abdominal pain, uncontrolled pain, chest pain,  shortness of breath or any other alarming symptoms.  Please follow-up your doctor as instructed in a week time and call the office for appointment.  Please avoid alcohol, smoking, or any other illicit substance and maintain healthy habits including taking your regular medications as prescribed.  You were cared for by a hospitalist during your hospital stay. If you have any questions about your discharge medications or the care you received while you were in the hospital after you are discharged, you can call the unit and ask to speak with the hospitalist on call if the hospitalist that took care of you is not available.  Once you are discharged, your primary care physician will handle any further medical issues. Please note that NO REFILLS for any discharge medications will be authorized once you are discharged, as it is imperative that you return to your primary care physician (or establish a relationship with a primary care physician if you do not have one) for your aftercare needs so that they can reassess your need for medications and monitor your lab values   Increase activity slowly   Complete by: As  directed       Allergies as of 04/25/2022       Reactions   Ditropan [oxybutynin] Swelling, Other (See Comments)   Quinine Other (See Comments)   Platelets dropped- consumptive coagulopathy   Vibra-tab [doxycycline] Other (See Comments)   Epistaxis    Voltaren [diclofenac] Swelling, Other (See Comments)   Elevated liver enzymes Lip swelling   Zoloft [sertraline] Other (See Comments)   Insomnia Acute urinary retention   Cymbalta [duloxetine Hcl] Other (See Comments)   Night sweats   Morphine And Related Nausea And Vomiting, Swelling, Other (See Comments)   Facial and lip swelling OK to use IR form of hydrocodone and oxycodone   Oxycontin [oxycodone Hcl] Swelling, Other (See Comments)   Lip swelling OK to use IR form   Tape Other (See Comments)   Skin tears easily SKIN IS FRAGILE- CERTAIN BANDAGES TEAR OFF THE SKIN, "BANDAIDS ARE OKAY."   Voltaren [diclofenac Sodium] Other (See Comments)   Elevated liver enzymes   Zohydro Er [hydrocodone Bitartrate Er] Swelling   Facial swelling OK to use IR form   Fentanyl Nausea And Vomiting, Other (See Comments)   Nausea, vomiting ad headaches - Per patient "okay  to use nothing ever happened with it"   Neurontin [gabapentin] Other (See Comments)   Unknown reaction        Medication List     STOP taking these medications    dabigatran 150 MG Caps capsule Commonly known as: Pradaxa       TAKE these medications    acetaminophen 500 MG tablet Commonly known as: TYLENOL Take 500 mg by mouth every 6 (six) hours as needed for mild pain or headache.   aspirin EC 81 MG tablet Take 81 mg by mouth daily. Swallow whole.   carvedilol 25 MG tablet Commonly known as: COREG Take 1 tablet by mouth once a day every evening   diphenhydramine-acetaminophen 25-500 MG Tabs tablet Commonly known as: TYLENOL PM Take 1 tablet by mouth at bedtime.   docusate sodium 100 MG capsule Commonly known as: COLACE Take 100 mg by mouth 2 (two)  times daily.   enoxaparin 120 MG/0.8ML injection Commonly known as: LOVENOX Inject 0.8 mLs (120 mg total) into the skin every 12 (twelve) hours. Start taking on: April 26, 2022   fluticasone 50 MCG/ACT nasal spray Commonly known as: FLONASE Place 2 sprays into both nostrils daily. What changed:  when to take this reasons to take this   furosemide 20 MG tablet Commonly known as: LASIX Take 20 mg by mouth daily as needed for edema.   Insulin Degludec FlexTouch 100 UNIT/ML Sopn Inject 70 Units into the skin daily.   Jardiance 25 MG Tabs tablet Generic drug: empagliflozin Take 1 tablet (25 mg total) by mouth daily.   lansoprazole 30 MG capsule Commonly known as: PREVACID Take 1 capsule (30 mg total) by mouth daily before a meal.   levocetirizine 5 MG tablet Commonly known as: XYZAL Take 5 mg by mouth every evening.   lidocaine 5 % Commonly known as: LIDODERM Place 1 patch onto the skin daily. Remove & Discard patch within 12 hours or as directed by MD   losartan 50 MG tablet Commonly known as: COZAAR Take 1 tablet by mouth once every evening   magnesium oxide 400 MG tablet Commonly known as: MAG-OX Take 800 mg by mouth at bedtime.   nystatin 100000 UNIT/ML suspension Commonly known as: MYCOSTATIN Use 4-5 mL for Mouth/Throat as directed four times a day What changed:  how much to take how to take this when to take this reasons to take this   ondansetron 4 MG disintegrating tablet Commonly known as: ZOFRAN-ODT Take 1 tablet (4 mg total) by mouth every 8 (eight) hours as needed for nausea or vomiting.   pravastatin 20 MG tablet Commonly known as: PRAVACHOL Take 1 tablet (20 mg total) by mouth 2 (two) times a week. What changed: additional instructions   pregabalin 50 MG capsule Commonly known as: LYRICA Take 1-2 capsules (50-100 mg total) by mouth at bedtime. What changed: how much to take   Trulicity 4.5 0000000 Sopn Generic drug: Dulaglutide Inject 4.5  mg into the skin once a week.        Allergies  Allergen Reactions   Ditropan [Oxybutynin] Swelling and Other (See Comments)   Quinine Other (See Comments)    Platelets dropped- consumptive coagulopathy     Vibra-Tab [Doxycycline] Other (See Comments)    Epistaxis     Voltaren [Diclofenac] Swelling and Other (See Comments)    Elevated liver enzymes Lip swelling    Zoloft [Sertraline] Other (See Comments)    Insomnia Acute urinary retention   Cymbalta [Duloxetine Hcl] Other (  See Comments)    Night sweats   Morphine And Related Nausea And Vomiting, Swelling and Other (See Comments)    Facial and lip swelling OK to use IR form of hydrocodone and oxycodone   Oxycontin [Oxycodone Hcl] Swelling and Other (See Comments)    Lip swelling OK to use IR form   Tape Other (See Comments)    Skin tears easily  SKIN IS FRAGILE- CERTAIN BANDAGES TEAR OFF THE SKIN, "BANDAIDS ARE OKAY."   Voltaren [Diclofenac Sodium] Other (See Comments)    Elevated liver enzymes   Zohydro Er [Hydrocodone Bitartrate Er] Swelling    Facial swelling OK to use IR form   Fentanyl Nausea And Vomiting and Other (See Comments)    Nausea, vomiting ad headaches - Per patient "okay to use nothing ever happened with it"   Neurontin [Gabapentin] Other (See Comments)    Unknown reaction     The results of significant diagnostics from this hospitalization (including imaging, microbiology, ancillary and laboratory) are listed below for reference.    Microbiology: No results found for this or any previous visit (from the past 240 hour(s)).  Procedures/Studies: DG Shoulder Right Port  Result Date: 04/24/2022 CLINICAL DATA:  [id number], postoperative right shoulder pain. EXAM: RIGHT SHOULDER - 1 VIEW COMPARISON:  None Available. FINDINGS: There is no evidence of fracture or dislocation. Soft tissues show no obvious acute findings. There is mild osteopenia. There is a suture anchor in the upper lateral humeral head. Mild  spurring noted AC joint. There are soft tissue calcifications along the posterior humeral cuff insertion which could be due to calcific tendinopathy or calcific tendinitis. There are overlying monitor wires. IMPRESSION: 1. No evidence of fracture or dislocation. 2. Soft tissue calcifications along the posterior humeral cuff insertion which could be due to calcific tendinopathy or calcific tendinitis. Electronically Signed   By: Telford Nab M.D.   On: 04/24/2022 20:23   CT VENOGRAM ABD/PEL  Result Date: 04/22/2022 CLINICAL DATA:  Leg deep vein thrombosis (DVT) suspected concern from caval thrombus. EXAM: CT VENOGRAM ABDOMEN AND PELVIS WITH CONTRAST TECHNIQUE: Multidetector CT imaging of the abdomen and pelvis was performed using the standard protocol during bolus administration of intravenous contrast. Multiplanar reconstructed images and MIPs were obtained and reviewed to evaluate the vascular anatomy. RADIATION DOSE REDUCTION: This exam was performed according to the departmental dose-optimization program which includes automated exposure control, adjustment of the mA and/or kV according to patient size and/or use of iterative reconstruction technique. CONTRAST:  148m OMNIPAQUE IOHEXOL 350 MG/ML SOLN COMPARISON:  Lower extremity venous duplex, 04/21/2022 and 01/23/2022. CT AP, 10/06/2021 and 03/22/2021. IR fluoroscopy, 10/08/2021. FINDINGS: VASCULAR Suboptimal evaluation, secondary to poor contrast opacification such that emboli could missed. AORTA AND ARTERIAL VASCULATURE: Mild-to-moderate calcified aortoiliac atherosclerotic disease. Normal without aneurysm, dissection, vasculitis or significant stenosis. 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 *Patent IVC common iliac veins, external iliac veins and CFVs bilaterally. *Diminutive appearance of the RIGHT femoral and popliteal veins Review of the MIP images confirms the  above findings. NON-VASCULAR Lower chest: Punctate granulomas at the middle lobe. No suspicious pulmonary nodule or mass of the lung bases. Hepatobiliary: No focal liver abnormality is seen. No gallstones, gallbladder wall thickening, or biliary dilatation. Pancreas: Distal pancreatectomy. No pancreatic ductal dilatation or surrounding inflammatory changes. Spleen: Splenectomy. Adrenals/Urinary Tract: Adrenal glands are unremarkable. Kidneys are normal, without renal calculi, focal lesion, or hydronephrosis. Bladder is unremarkable. Stomach/Bowel: Moderate-sized hiatus  hernia. No CTV evidence of gastric vascular compromise. Appendix is not definitively visualized. No evidence of bowel wall thickening, distention, or inflammatory changes. Lymphatic: No enlarged abdominal or pelvic lymph nodes. Reproductive: Prostate is unremarkable. Other: Small fat-containing LEFT inguinal hernia versus cord lipoma. Supraumbilical ventral abdominal hernia repair. No abdominopelvic ascites. Musculoskeletal: Focal anterior abdominal wall contusions, likely injection sites. No acute or significant osseous findings. IMPRESSION: VASCULAR 1. Infrarenal IVC filter with mild medial tilt at its hub. Patent IVC and iliac and external iliac veins bilaterally. 2. Diminutive appearance of the RIGHT femoral and popliteal veins, favored consistent with chronic/post thrombotic change. NON-VASCULAR 1. No acute abdominopelvic process. 2. Hiatus hernia.  No CT evidence of gastric vascular compromise. 3. Aortic Atherosclerosis (ICD10-I70.0). Additional incidental, chronic and senescent findings as above. Michaelle Birks, MD [facility] Electronically Signed   By: Michaelle Birks M.D.   On: 04/22/2022 21:50   US Venous Img Lower Unilateral Right (DVT)  Result Date: 04/21/2022 CLINICAL DATA:  Right lower extremity pain and swelling. EXAM: Right LOWER EXTREMITY VENOUS DOPPLER ULTRASOUND TECHNIQUE:  Gray-scale sonography with graded compression, as well as color Doppler and duplex ultrasound were performed to evaluate the lower extremity deep venous systems from the level of the common femoral vein and including the common femoral, femoral, profunda femoral, popliteal and calf veins including the posterior tibial, peroneal and gastrocnemius veins when visible. The superficial great saphenous vein was also interrogated. Spectral Doppler was utilized to evaluate flow at rest and with distal augmentation maneuvers in the common femoral, femoral and popliteal veins. COMPARISON:  January 23, 2022. FINDINGS: Contralateral Common Femoral Vein: Respiratory phasicity is normal and symmetric with the symptomatic side. No evidence of thrombus. Normal compressibility. Common Femoral Vein: No evidence of thrombus. Normal compressibility, respiratory phasicity and response to augmentation. Saphenofemoral Junction: No evidence of thrombus. Normal compressibility and flow on color Doppler imaging. Profunda Femoral Vein: Noncompressible with no flow consistent with occlusive thrombus. Femoral Vein: Noncompressible with no flow consistent with occlusive thrombus. Popliteal Vein: Noncompressible with no flow consistent with occlusive thrombus. Calf Veins: No evidence of thrombus. Normal compressibility and flow on color Doppler imaging. Superficial Great Saphenous Vein: No evidence of thrombus. Normal compressibility. Venous Reflux:  None. Other Findings: 1.2 cm enlarged lymph node is noted in right groin region with fatty hilum. IMPRESSION: Occlusive deep venous thrombosis is noted in the right profundus femoral, superficial femoral and popliteal veins. 1.2 cm enlarged lymph node is noted in right groin region which most likely is reactive or inflammatory in etiology. Follow-up ultrasound in 2-3 months is recommended to ensure resolution or stability. Electronically Signed   By: Marijo Conception M.D.   On: 04/21/2022 15:44     Labs: BNP (last 3 results) No results for input(s): "BNP" in the last 8760 hours. Basic Metabolic Panel: Recent Labs  Lab 04/21/22 1715 04/22/22 0525 04/23/22 0517 04/24/22 0427 04/25/22 0505  NA 136 139 137 137 136  K 3.9 3.6 3.6 3.9 4.4  CL 111 110 109 107 105  CO2 21* '22 23 24 24  '$ GLUCOSE 82 106* 86 105* 97  BUN '16 16 17 14 17  '$ CREATININE 0.85 0.87 0.88 0.96 0.98  CALCIUM 9.4 9.9 10.0 10.0 10.1   Liver Function Tests: Recent Labs  Lab 04/19/22 1510 04/23/22 0517 04/24/22 0427 04/25/22 0505  AST 11* '15 16 20  '$ ALT '10 13 13 15  '$ ALKPHOS 73 70 66 70  BILITOT 0.4 0.5 0.6 0.4  PROT 6.8 6.7 6.3* 6.6  ALBUMIN 4.1 3.5 3.5 3.5   No results for input(s): "LIPASE", "AMYLASE" in the last 168 hours. No results for input(s): "AMMONIA" in the last 168 hours. CBC: Recent Labs  Lab 04/19/22 1510 04/21/22 1715 04/22/22 0525 04/23/22 0517 04/24/22 0427 04/25/22 0505  WBC 7.2 7.3 9.2 8.1 7.7 8.6  NEUTROABS 2.7 2.9  --  2.7 2.7 3.8  HGB 12.2* 11.3* 11.2* 11.4* 11.2* 11.2*  HCT 39.5 35.9* 36.4* 37.1* 36.2* 37.1*  MCV 81.8 79.4* 82.0 82.4 81.7 82.6  PLT 380 514* 514* 527* 496* 462*   Cardiac Enzymes: No results for input(s): "CKTOTAL", "CKMB", "CKMBINDEX", "TROPONINI" in the last 168 hours. BNP: Invalid input(s): "POCBNP" CBG: Recent Labs  Lab 04/24/22 1130 04/24/22 1916 04/24/22 2053 04/25/22 0733 04/25/22 1127  GLUCAP 85 94 158* 155* 167*    Anemia work up No results for input(s): "VITAMINB12", "FOLATE", "FERRITIN", "TIBC", "IRON", "RETICCTPCT" in the last 72 hours. Urinalysis    Component Value Date/Time   COLORURINE YELLOW 04/21/2022 1845   APPEARANCEUR CLOUDY (A) 04/21/2022 1845   LABSPEC 1.015 04/21/2022 1845   PHURINE 5.5 04/21/2022 1845   GLUCOSEU >=500 (A) 04/21/2022 1845   HGBUR LARGE (A) 04/21/2022 1845   BILIRUBINUR NEGATIVE 04/21/2022 1845   BILIRUBINUR negative 07/04/2011 1902   KETONESUR NEGATIVE 04/21/2022 1845   PROTEINUR NEGATIVE  04/21/2022 1845   UROBILINOGEN 0.2 05/22/2013 0924   NITRITE NEGATIVE 04/21/2022 1845   LEUKOCYTESUR NEGATIVE 04/21/2022 1845   Sepsis Labs Recent Labs  Lab 04/22/22 0525 04/23/22 0517 04/24/22 0427 04/25/22 0505  WBC 9.2 8.1 7.7 8.6   Microbiology No results found for this or any previous visit (from the past 240 hour(s)).   Time coordinating discharge: 35 minutes  SIGNED: Antonieta Pert, MD  [facility] 04/25/2022, 11:35 AM  If 7PM-7AM, please contact night-coverage [email or web address]

## 2022-04-27 DIAGNOSIS — Z86718 Personal history of other venous thrombosis and embolism: Secondary | ICD-10-CM | POA: Diagnosis not present

## 2022-04-27 DIAGNOSIS — Z09 Encounter for follow-up examination after completed treatment for conditions other than malignant neoplasm: Secondary | ICD-10-CM | POA: Diagnosis not present

## 2022-04-27 DIAGNOSIS — Z7901 Long term (current) use of anticoagulants: Secondary | ICD-10-CM | POA: Diagnosis not present

## 2022-05-01 ENCOUNTER — Other Ambulatory Visit (HOSPITAL_BASED_OUTPATIENT_CLINIC_OR_DEPARTMENT_OTHER): Payer: Self-pay

## 2022-05-01 ENCOUNTER — Other Ambulatory Visit: Payer: Self-pay | Admitting: Interventional Radiology

## 2022-05-01 ENCOUNTER — Other Ambulatory Visit: Payer: Self-pay

## 2022-05-01 DIAGNOSIS — I82411 Acute embolism and thrombosis of right femoral vein: Secondary | ICD-10-CM

## 2022-05-02 DIAGNOSIS — R899 Unspecified abnormal finding in specimens from other organs, systems and tissues: Secondary | ICD-10-CM | POA: Diagnosis not present

## 2022-05-02 DIAGNOSIS — D6851 Activated protein C resistance: Secondary | ICD-10-CM | POA: Diagnosis not present

## 2022-05-02 DIAGNOSIS — R591 Generalized enlarged lymph nodes: Secondary | ICD-10-CM | POA: Diagnosis not present

## 2022-05-02 DIAGNOSIS — Z6837 Body mass index (BMI) 37.0-37.9, adult: Secondary | ICD-10-CM | POA: Diagnosis not present

## 2022-05-02 DIAGNOSIS — Z09 Encounter for follow-up examination after completed treatment for conditions other than malignant neoplasm: Secondary | ICD-10-CM | POA: Diagnosis not present

## 2022-05-03 ENCOUNTER — Other Ambulatory Visit (HOSPITAL_BASED_OUTPATIENT_CLINIC_OR_DEPARTMENT_OTHER): Payer: Self-pay

## 2022-05-03 ENCOUNTER — Other Ambulatory Visit: Payer: Self-pay | Admitting: Family Medicine

## 2022-05-03 DIAGNOSIS — R599 Enlarged lymph nodes, unspecified: Secondary | ICD-10-CM

## 2022-05-04 ENCOUNTER — Other Ambulatory Visit: Payer: Self-pay

## 2022-05-04 ENCOUNTER — Ambulatory Visit
Admission: RE | Admit: 2022-05-04 | Discharge: 2022-05-04 | Disposition: A | Discharge status: Home / Self Care | Payer: HMO | Source: Ambulatory Visit | Attending: Otolaryngology | Admitting: Otolaryngology

## 2022-05-04 DIAGNOSIS — J329 Chronic sinusitis, unspecified: Secondary | ICD-10-CM

## 2022-05-08 ENCOUNTER — Other Ambulatory Visit (HOSPITAL_BASED_OUTPATIENT_CLINIC_OR_DEPARTMENT_OTHER): Payer: Self-pay

## 2022-05-12 DIAGNOSIS — M47816 Spondylosis without myelopathy or radiculopathy, lumbar region: Secondary | ICD-10-CM | POA: Diagnosis not present

## 2022-05-12 DIAGNOSIS — M4826 Kissing spine, lumbar region: Secondary | ICD-10-CM | POA: Diagnosis not present

## 2022-05-13 DIAGNOSIS — E113393 Type 2 diabetes mellitus with moderate nonproliferative diabetic retinopathy without macular edema, bilateral: Secondary | ICD-10-CM | POA: Diagnosis not present

## 2022-05-22 ENCOUNTER — Other Ambulatory Visit: Payer: HMO

## 2022-05-29 ENCOUNTER — Other Ambulatory Visit (HOSPITAL_BASED_OUTPATIENT_CLINIC_OR_DEPARTMENT_OTHER): Payer: Self-pay

## 2022-06-02 ENCOUNTER — Ambulatory Visit
Admission: RE | Admit: 2022-06-02 | Discharge: 2022-06-02 | Disposition: A | Discharge status: Home / Self Care | Payer: PPO | Source: Ambulatory Visit | Attending: Student | Admitting: Student

## 2022-06-02 ENCOUNTER — Ambulatory Visit
Admission: RE | Admit: 2022-06-02 | Discharge: 2022-06-02 | Disposition: A | Discharge status: Home / Self Care | Payer: PPO | Source: Ambulatory Visit | Attending: Interventional Radiology | Admitting: Interventional Radiology

## 2022-06-02 DIAGNOSIS — I82411 Acute embolism and thrombosis of right femoral vein: Secondary | ICD-10-CM

## 2022-06-02 DIAGNOSIS — Z9889 Other specified postprocedural states: Secondary | ICD-10-CM | POA: Diagnosis not present

## 2022-06-02 DIAGNOSIS — I824Z9 Acute embolism and thrombosis of unspecified deep veins of unspecified distal lower extremity: Secondary | ICD-10-CM | POA: Diagnosis not present

## 2022-06-02 DIAGNOSIS — I82401 Acute embolism and thrombosis of unspecified deep veins of right lower extremity: Secondary | ICD-10-CM | POA: Diagnosis not present

## 2022-06-02 DIAGNOSIS — R600 Localized edema: Secondary | ICD-10-CM | POA: Diagnosis not present

## 2022-06-02 HISTORY — PX: IR RADIOLOGIST EVAL & MGMT: IMG5224

## 2022-06-02 NOTE — Progress Notes (Signed)
Referring Physician(s): Covington,Jamie R  Supervising Physician: {Supervising Physician:[id number]}  Chief Complaint: The patient is seen in follow up today s/p ***  History of present illness:  ***  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" no pulmonary md, sees dr Leonette Most alan ross   Factor V Leiden mutation (HCC)    "never had blood clots"   GERD (gastroesophageal reflux disease)    Hiatal hernia    History of kidney stones    multiple kidney stones-not a problem at present   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, pT1 pNX) no further treatment   History of panic attacks    History of traumatic head injury    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 SURGERY 09-21-2016 healed now   Peripheral vascular disease (HCC)    right leg - decreased pulse    Pleural effusion    Wears partial dentures    LOWER   Welders' lung (HCC)    chronic cough    Past Surgical History:  Procedure Laterality Date   ANAL FISTULECTOMY N/A 04/01/2013   Procedure: EXAM UNDER ANESTHESIA WITH ANAL  FISSURectomy, sphincterotomy and internal hemorrhoidectomy;  Surgeon: Shelly Rubenstein, MD;  Location: [facility];  Service: General;  Laterality: N/A;   APPLICATION OF WOUND VAC N/A 05/16/2015   Procedure: APPLICATION OF WOUND VAC;  Surgeon: Darnell Level, MD;  Location: [facility];  Service: General;  Laterality: N/A;   APPLICATION OF WOUND VAC N/A 05/20/2015   Procedure: EXHANGE OF ABDOMINAL  WOUND VAC DRESSING;  Surgeon: Darnell Level, MD;  Location: [facility];  Service: General;  Laterality: N/A;   COLONOSCOPY     CYSTOSCOPY N/A 05/23/2021   Procedure: Bluford Kaufmann;  Surgeon: Marcine Matar, MD;  Location: [facility]  Service: Urology;  Laterality: N/A;   CYSTOSCOPY WITH INSERTION OF UROLIFT N/A 10/12/2016   Procedure: CYSTOSCOPY WITH INSERTION OF UROLIFT;  Surgeon: Marcine Matar, MD;  Location: [facility];  Service: Urology;  Laterality: N/A;   EUS N/A 12/31/2014   Procedure: UPPER ENDOSCOPIC ULTRASOUND (EUS) LINEAR;  Surgeon: Rachael Fee, MD;  Location: [facility];  Service: Endoscopy;  Laterality: N/A;   HARDWARE REMOVAL Left 2013   left ankle   HEMORRHOIDECTOMY WITH HEMORRHOID BANDING     INCISION AND DRAINAGE ABSCESS N/A 05/14/2015   Procedure: DRAINAGE OF INFECTED PANCREATIC PSEUDOCYST;  Surgeon: Tawanna Cooler  Gerkin, MD;  Location: [facility];  Service: General;  Laterality: N/A;   INCISION AND DRAINAGE OF WOUND N/A 05/16/2015   Procedure: IRRIGATION AND DEBRIDEMENT WOUND;  Surgeon: Darnell Level, MD;  Location: [facility];  Service: General;  Laterality: N/A;   INCISIONAL HERNIA REPAIR N/A 11/12/2015   Procedure: REPAIR INCISIONAL HERNIA WITH MESH;  Surgeon: Darnell Level, MD;  Location: [facility];  Service: General;  Laterality: N/A;   INGUINAL HERNIA REPAIR Right 1974;  1982   INSERTION OF MESH N/A 11/12/2015   Procedure: INSERTION OF MESH;  Surgeon: Darnell Level, MD;  Location: [facility];  Service: General;  Laterality: N/A;   IR GENERIC HISTORICAL  04/20/2015    IR RADIOLOGIST EVAL & MGMT 04/20/2015 [facility] INTERV RAD   IR IVC FILTER PLMT / S&I /IMG GUID/MOD SED  11/25/2020   IR RADIOLOGIST EVAL & MGMT  10/26/2021   IR RADIOLOGIST EVAL & MGMT  01/26/2022   IR RADIOLOGY PERIPHERAL GUIDED IV START  11/25/2020   IR THROMBECT VENO MECH MOD SED  10/08/2021   IR US GUIDE VASC ACCESS LEFT  04/24/2022   IR US GUIDE VASC ACCESS RIGHT  11/25/2020   IR US GUIDE VASC ACCESS RIGHT  10/08/2021   IR VENO/EXT/UNI RIGHT  10/12/2021   IR VENO/EXT/UNI RIGHT  04/24/2022   IR VENOCAVAGRAM IVC  04/24/2022   KNEE ARTHROSCOPY Bilateral right 08-08-2000;  left 11-23-2000   meniscal repair and chondroplasty's   KNEE ARTHROSCOPY  03/01/2012   Procedure: ARTHROSCOPY KNEE;  Surgeon: Loanne Drilling, MD;  Location: [facility];  Service: Orthopedics;  Laterality: Left;  WITH SYNOVECTOMY    LAPAROTOMY N/A 05/14/2015   Procedure: EXPLORATORY LAPAROTOMY;  Surgeon: Darnell Level, MD;  Location: [facility];  Service: General;  Laterality: N/A;   MASS EXCISION N/A 12/24/2019   Procedure: EXCISION LIP CARCINOMA;  Surgeon: Newman Pies, MD;  Location: [facility];  Service: ENT;  Laterality: N/A;   ORIF LEFT ANKLE FX  08/20/2009   distal tib-fib and malleolus   ROTATOR CUFF REPAIR Bilateral right 1994; left 1993, left 1993   TOTAL KNEE ARTHROPLASTY Right 06/02/2013   Procedure: RIGHT TOTAL KNEE ARTHROPLASTY;  Surgeon: Loanne Drilling, MD;  Location: [facility];  Service: Orthopedics;  Laterality: Right;   TOTAL KNEE ARTHROPLASTY Left 01-31-2008   dr Lequita Halt   TOTAL KNEE REVISION Right 01/23/2018   Procedure: right knee polyethylene revision;  Surgeon: Ollen Gross, MD;  Location: [facility];  Service: Orthopedics;  Laterality: Right;    TRANSURETHRAL RESECTION OF BLADDER TUMOR N/A 05/23/2021   Procedure: TRANSURETHRAL BIOPSY OF BLADDER;  Surgeon: Marcine Matar, MD;  Location: [facility];  Service: Urology;  Laterality: N/A;   TRANSURETHRAL RESECTION OF PROSTATE N/A 04/21/2019    Procedure: TRANSURETHRAL RESECTION OF THE PROSTATE (TURP);  Surgeon: Marcine Matar, MD;  Location: [facility];  Service: Urology;  Laterality: N/A;   UMBILICAL HERNIA REPAIR  08-11-1999   dr Magnus Ivan   UPPER GASTROINTESTINAL ENDOSCOPY  sept 2016   WOUND EXPLORATION N/A 09/21/2016   Procedure: WOUND EXPLORATION AND DEBRIDEMENT ABDOMINAL WALL;  Surgeon: Darnell Level, MD;  Location: [facility];  Service: General;  Laterality: N/A;    Allergies: Ditropan [oxybutynin], Quinine, Vibra-tab [doxycycline], Voltaren [diclofenac], Zoloft [sertraline], Cymbalta [duloxetine hcl], Morphine and related, Oxycontin [oxycodone hcl], Tape, Voltaren [diclofenac sodium], Zohydro er [hydrocodone bitartrate er], Fentanyl, and Neurontin [gabapentin]  Medications: Prior to Admission medications   Medication Sig Start Date End Date Taking? Authorizing Provider  acetaminophen (TYLENOL) 500 MG tablet Take 500 mg by mouth every 6 (six) hours as needed for mild pain or headache.    [provider]  aspirin EC 81 MG tablet Take 81 mg by mouth daily. Swallow whole.    [provider]  carvedilol (COREG) 25 MG tablet Take 1 tablet by mouth once a day every evening 07/08/21     diphenhydramine-acetaminophen (TYLENOL PM) 25-500 MG TABS tablet Take 1 tablet by mouth at bedtime.    [provider]  docusate sodium (COLACE) 100 MG capsule Take 100 mg by mouth 2 (two) times daily.    [provider]  Dulaglutide (TRULICITY) 4.5 MG/0.5ML SOPN Inject 4.5 mg into the skin once a week. 03/10/22     empagliflozin (JARDIANCE) 25 MG TABS tablet Take 1 tablet (25 mg total) by mouth daily. 03/10/22     enoxaparin (LOVENOX) 120 MG/0.8ML injection Inject 0.8 mLs (120 mg total) into the skin every 12 (twelve) hours. 04/26/22 06/01/22  Lanae Boast, MD  fluticasone (FLONASE) 50 MCG/ACT nasal spray Place 2 sprays into both nostrils daily. Patient taking differently: Place 1-2 sprays into both nostrils  daily as needed for allergies. 02/21/22     furosemide (LASIX) 20 MG tablet Take 20 mg by mouth daily as needed for edema.    [provider]  Insulin Degludec FlexTouch 100 UNIT/ML SOPN Inject 70 Units into the skin daily. 03/10/22     lansoprazole (PREVACID) 30 MG capsule Take 1 capsule (30 mg total) by mouth daily before a meal. 04/05/22     levocetirizine (XYZAL) 5 MG tablet Take 5 mg by mouth every evening.    [provider]  lidocaine (LIDODERM) 5 % Place 1 patch onto the skin daily. Remove & Discard patch within 12 hours or as directed by MD 04/25/22   Lanae Boast, MD  losartan (COZAAR) 50 MG tablet Take 1 tablet by mouth once every evening 07/08/21     magnesium oxide (MAG-OX) 400 MG tablet Take 800 mg by mouth at bedtime.    [provider]  nystatin (MYCOSTATIN) 100000 UNIT/ML suspension Use 4-5 mL for Mouth/Throat as directed four times a day Patient taking differently: Take 5 mLs by mouth 4 (four) times daily as needed (thrush). 07/08/21     ondansetron (ZOFRAN-ODT) 4 MG disintegrating tablet Take 1 tablet (4 mg total) by mouth every 8 (eight) hours as needed for nausea or vomiting. 10/10/21   Amin, Loura Halt, MD  pravastatin (PRAVACHOL) 20 MG tablet Take 1 tablet (20 mg total) by mouth 2 (two) times a week. Patient taking differently: Take 20 mg by mouth 2 (two) times a week. Take on Monday and Friday Evenings 03/16/22     pregabalin (LYRICA) 50 MG capsule Take 1-2 capsules (50-100 mg total) by mouth at bedtime. Patient taking differently: Take 50 mg by mouth at bedtime. 03/09/22        Family History  Problem Relation Age of Onset   Bone cancer Father        Jaw   Heart disease Paternal Uncle    Diabetes Maternal Grandmother    Factor V Leiden deficiency Daughter    Colon cancer Neg Hx    Esophageal cancer Neg Hx    Stomach cancer Neg Hx    Rectal cancer Neg Hx    Migraines Neg Hx     Social History   Socioeconomic History   Marital status: Married     Spouse name: Jasmine December   Number  of children: 2   Years of education: Not on file   Highest education level: Not on file  Occupational History   Occupation: retired   Tobacco Use   Smoking status: Former    Packs/day: 4.00    Years: 17.00    Additional pack years: 0.00    Total pack years: 68.00    Types: Cigarettes    Quit date: 02/21/1975    Years since quitting: 47.3   Smokeless tobacco: Never  Vaping Use   Vaping Use: Never used  Substance and Sexual Activity   Alcohol use: No    Alcohol/week: 0.0 standard drinks of alcohol   Drug use: No   Sexual activity: Not Currently  Other Topics Concern   Not on file  Social History Narrative   Married, retired Therapist, art   1 son 1 daughter   2 caffeine free (will switch off once supply is finished with caffiene).       Social Determinants of Health   Financial Resource Strain: Not on file  Food Insecurity: No Food Insecurity (04/22/2022)   Hunger Vital Sign    Worried About Running Out of Food in the Last Year: Never true    Ran Out of Food in the Last Year: Never true  Transportation Needs: No Transportation Needs (04/22/2022)   PRAPARE - Administrator, Civil Service (Medical): No    Lack of Transportation (Non-Medical): No  Physical Activity: Not on file  Stress: Not on file  Social Connections: Not on file     Vital Signs: There were no vitals taken for this visit.  Physical Exam  Imaging: No results found.  Labs:  CBC: Recent Labs    04/22/22 0525 04/23/22 0517 04/24/22 0427 04/25/22 0505  WBC 9.2 8.1 7.7 8.6  HGB 11.2* 11.4* 11.2* 11.2*  HCT 36.4* 37.1* 36.2* 37.1*  PLT 514* 527* 496* 462*    COAGS: Recent Labs    10/06/21 1330 10/07/21 0223 10/08/21 0245 10/09/21 0357 10/09/21 1455 04/21/22 1715  INR 1.3*  --   --   --   --  1.5*  APTT 35   < > 100* 107* 78* 55*   < > = values in this interval not displayed.    BMP: Recent Labs    04/22/22 0525 04/23/22 0517  04/24/22 0427 04/25/22 0505  NA 139 137 137 136  K 3.6 3.6 3.9 4.4  CL 110 109 107 105  CO2 GLUCOSE 106* 86 105* 97  BUN CALCIUM 9.9 10.0 10.0 10.1  CREATININE 0.87 0.88 0.96 0.98  GFRNONAA >60 >60 >60 >60    LIVER FUNCTION TESTS: Recent Labs    04/19/22 1510 04/23/22 0517 04/24/22 0427 04/25/22 0505  BILITOT 0.4 0.5 0.6 0.4  AST 11* ALT ALKPHOS 73 70 66 70  PROT 6.8 6.7 6.3* 6.6  ALBUMIN 4.1 3.5 3.5 3.5    Assessment and Plan:  ***  Electronically Signed: Roanna Banning 06/02/2022, 9:12 AM   I spent a total of {Established Out-Pt:[id number]} in face to face in clinical consultation, greater than 50% of which was counseling/coordinating care for ***

## 2022-06-05 ENCOUNTER — Other Ambulatory Visit (HOSPITAL_BASED_OUTPATIENT_CLINIC_OR_DEPARTMENT_OTHER): Payer: Self-pay

## 2022-06-05 ENCOUNTER — Other Ambulatory Visit: Payer: Self-pay | Admitting: *Deleted

## 2022-06-05 MED ORDER — ENOXAPARIN SODIUM 120 MG/0.8ML IJ SOSY
120.0000 mg | PREFILLED_SYRINGE | Freq: Two times a day (BID) | INTRAMUSCULAR | 0 refills | Status: DC
  Filled 2022-06-05: qty 48, 30d supply, fill #0
  Filled 2022-06-30: qty 48, 30d supply, fill #1

## 2022-06-05 MED ORDER — INSULIN DEGLUDEC FLEXTOUCH 100 UNIT/ML ~~LOC~~ SOPN
55.0000 [IU] | PEN_INJECTOR | Freq: Every day | SUBCUTANEOUS | 1 refills | Status: DC
  Filled 2022-06-05: qty 15, 27d supply, fill #0
  Filled 2022-10-30: qty 48, 88d supply, fill #0
  Filled 2023-01-20 – 2023-01-21 (×2): qty 48, 88d supply, fill #1

## 2022-06-12 ENCOUNTER — Other Ambulatory Visit (HOSPITAL_BASED_OUTPATIENT_CLINIC_OR_DEPARTMENT_OTHER): Payer: Self-pay

## 2022-06-12 ENCOUNTER — Other Ambulatory Visit: Payer: Self-pay

## 2022-06-13 DIAGNOSIS — E113393 Type 2 diabetes mellitus with moderate nonproliferative diabetic retinopathy without macular edema, bilateral: Secondary | ICD-10-CM | POA: Diagnosis not present

## 2022-06-19 ENCOUNTER — Encounter: Payer: Self-pay | Admitting: Hematology & Oncology

## 2022-06-19 ENCOUNTER — Other Ambulatory Visit: Payer: Self-pay

## 2022-06-19 ENCOUNTER — Inpatient Hospital Stay (HOSPITAL_BASED_OUTPATIENT_CLINIC_OR_DEPARTMENT_OTHER): Payer: PPO | Admitting: Hematology & Oncology

## 2022-06-19 ENCOUNTER — Inpatient Hospital Stay: Payer: PPO | Attending: Hematology & Oncology

## 2022-06-19 VITALS — BP 103/44 | HR 76 | Temp 98.9°F | Resp 19 | Ht 71.0 in | Wt 262.0 lb

## 2022-06-19 DIAGNOSIS — R109 Unspecified abdominal pain: Secondary | ICD-10-CM | POA: Insufficient documentation

## 2022-06-19 DIAGNOSIS — E119 Type 2 diabetes mellitus without complications: Secondary | ICD-10-CM | POA: Insufficient documentation

## 2022-06-19 DIAGNOSIS — I10 Essential (primary) hypertension: Secondary | ICD-10-CM | POA: Diagnosis not present

## 2022-06-19 DIAGNOSIS — D6851 Activated protein C resistance: Secondary | ICD-10-CM | POA: Diagnosis not present

## 2022-06-19 DIAGNOSIS — I82401 Acute embolism and thrombosis of unspecified deep veins of right lower extremity: Secondary | ICD-10-CM

## 2022-06-19 DIAGNOSIS — Z7901 Long term (current) use of anticoagulants: Secondary | ICD-10-CM | POA: Insufficient documentation

## 2022-06-19 DIAGNOSIS — Z8507 Personal history of malignant neoplasm of pancreas: Secondary | ICD-10-CM | POA: Diagnosis not present

## 2022-06-19 DIAGNOSIS — I82409 Acute embolism and thrombosis of unspecified deep veins of unspecified lower extremity: Secondary | ICD-10-CM | POA: Diagnosis not present

## 2022-06-19 LAB — CMP (CANCER CENTER ONLY)
ALT: 16 U/L (ref 0–44)
AST: 13 U/L — ABNORMAL LOW (ref 15–41)
Albumin: 3.9 g/dL (ref 3.5–5.0)
Alkaline Phosphatase: 65 U/L (ref 38–126)
Anion gap: 7 (ref 5–15)
BUN: 20 mg/dL (ref 8–23)
CO2: 23 mmol/L (ref 22–32)
Calcium: 10.8 mg/dL — ABNORMAL HIGH (ref 8.9–10.3)
Chloride: 107 mmol/L (ref 98–111)
Creatinine: 1.06 mg/dL (ref 0.61–1.24)
GFR, Estimated: >60 mL/min (ref >60)
Glucose, Bld: 190 mg/dL — ABNORMAL HIGH (ref 70–99)
Potassium: 4.4 mmol/L (ref 3.5–5.1)
Sodium: 137 mmol/L (ref 135–145)
Total Bilirubin: 0.3 mg/dL (ref 0.3–1.2)
Total Protein: 6.6 g/dL (ref 6.5–8.1)

## 2022-06-19 LAB — CBC WITH DIFFERENTIAL (CANCER CENTER ONLY)
Abs Immature Granulocytes: 0.05 10*3/uL (ref 0.00–0.07)
Basophils Absolute: 0.1 10*3/uL (ref 0.0–0.1)
Basophils Relative: 2 %
Eosinophils Absolute: 0.4 10*3/uL (ref 0.0–0.5)
Eosinophils Relative: 7 %
HCT: 42.9 % (ref 39.0–52.0)
Hemoglobin: 13.7 g/dL (ref 13.0–17.0)
Immature Granulocytes: 1 %
Lymphocytes Relative: 37 %
Lymphs Abs: 2.2 10*3/uL (ref 0.7–4.0)
MCH: 26.5 pg (ref 26.0–34.0)
MCHC: 31.9 g/dL (ref 30.0–36.0)
MCV: 83 fL (ref 80.0–100.0)
Monocytes Absolute: 0.8 10*3/uL (ref 0.1–1.0)
Monocytes Relative: 13 %
Neutro Abs: 2.4 10*3/uL (ref 1.7–7.7)
Neutrophils Relative %: 40 %
Platelet Count: 423 10*3/uL — ABNORMAL HIGH (ref 150–400)
RBC: 5.17 MIL/uL (ref 4.22–5.81)
RDW: 18.1 % — ABNORMAL HIGH (ref 11.5–15.5)
WBC Count: 6.1 10*3/uL (ref 4.0–10.5)
nRBC: 0 % (ref 0.0–0.2)

## 2022-06-19 LAB — RETICULOCYTES
Immature Retic Fract: 12.8 % (ref 2.3–15.9)
RBC.: 5.19 MIL/uL (ref 4.22–5.81)
Retic Count, Absolute: 58.1 10*3/uL (ref 19.0–186.0)
Retic Ct Pct: 1.1 % (ref 0.4–3.1)

## 2022-06-19 LAB — FERRITIN: Ferritin: 8 ng/mL — ABNORMAL LOW (ref 24–336)

## 2022-06-19 NOTE — Progress Notes (Signed)
Hematology and Oncology Follow Up Visit  Derek [name] [id number] [birth year] 77 y.o. 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  Unfortunately, he developed another blood clot in the right leg.  When we last saw him, we did do a Doppler.  This seemed to show that there is a new thromboembolus in the right femoral-popliteal vein.  This appears to be subacute.  He was occlusive.  At that time, he had been on Pradaxa.  We stopped the Pradaxa.  Now is on Lovenox.  I think he sees Interventional Radiology and they will see about maybe doing a thrombectomy on him.  He has a lot of pain issues.  He has horrible back pain.  He really needs to have a spinal stimulator placed.  However, his pain specialist is little bit reluctant to place this since he has a blood clot in his leg.  From my point of view, I do not see a problem with him having spinal stimulator with him just being on blood thinner.  We can always stop the blood thinner a day or so before any procedure.  He also has problems with abdominal pain.  He does have a abdominal mesh and from hernia surgery.  He does have multiple other health issues.  He is trying his best to control his diabetes.  He has had no problems with nausea or vomiting.  There is no change in bowel or bladder habits.  Overall, I would have to say that his performance status is probably ECOG 1-2.    Medications:  Current Outpatient Medications:    acetaminophen (TYLENOL) 500 MG tablet, Take 500 mg by mouth every 6 (six) hours as needed for mild pain or headache., Disp: , Rfl:    aspirin EC 81 MG tablet, Take 81 mg by mouth daily. Swallow whole., Disp: , Rfl:    carvedilol (COREG) 25 MG tablet, Take 1 tablet by mouth once a day every evening, Disp: 90 tablet, Rfl: 4    diphenhydramine-acetaminophen (TYLENOL PM) 25-500 MG TABS tablet, Take 1 tablet by mouth at bedtime., Disp: , Rfl:    docusate sodium (COLACE) 100 MG capsule, Take 100 mg by mouth 2 (two) times daily., Disp: , Rfl:    Dulaglutide (TRULICITY) 4.5 MG/0.5ML SOPN, Inject 4.5 mg into the skin once a week., Disp: 6 mL, Rfl: 1   empagliflozin (JARDIANCE) 25 MG TABS tablet, Take 1 tablet (25 mg total) by mouth daily., Disp: 90 tablet, Rfl: 1   enoxaparin (LOVENOX) 120 MG/0.8ML injection, Inject 0.8 mLs (120 mg total) into the skin every 12 (twelve) hours., Disp: 60 mL, Rfl: 0   fluticasone (FLONASE) 50 MCG/ACT nasal spray, Place 2 sprays into both nostrils daily. (Patient taking differently: Place 1-2 sprays into both nostrils daily as needed for allergies.), Disp: 16 g, Rfl: 5   furosemide (LASIX) 20 MG tablet, Take 20 mg by mouth daily as needed for edema., Disp: , Rfl:    Insulin Degludec FlexTouch 100 UNIT/ML SOPN, Inject 70 Units into the skin daily., Disp: 60 mL, Rfl: 1   Insulin Degludec FlexTouch 100 UNIT/ML SOPN, Inject 55 Units into the skin daily., Disp: 60 mL, Rfl: 1   lansoprazole (PREVACID) 30 MG capsule, Take 1 capsule (30 mg total) by mouth daily before  a meal., Disp: 90 capsule, Rfl: 0   levocetirizine (XYZAL) 5 MG tablet, Take 5 mg by mouth every evening., Disp: , Rfl:    lidocaine (LIDODERM) 5 %, Place 1 patch onto the skin daily. Remove & Discard patch within 12 hours or as directed by MD, Disp: 30 patch, Rfl: 0   losartan (COZAAR) 50 MG tablet, Take 1 tablet by mouth once every evening, Disp: 90 tablet, Rfl: 4   magnesium oxide (MAG-OX) 400 MG tablet, Take 800 mg by mouth at bedtime., Disp: , Rfl:    nystatin (MYCOSTATIN) 100000 UNIT/ML suspension, Use 4-5 mL for Mouth/Throat as directed four times a day (Patient taking differently: Take 5 mLs by mouth 4 (four) times daily as needed (thrush).), Disp: 240 mL, Rfl: 3   ondansetron (ZOFRAN-ODT) 4 MG disintegrating tablet, Take 1 tablet (4  mg total) by mouth every 8 (eight) hours as needed for nausea or vomiting., Disp: 30 tablet, Rfl: 0   pravastatin (PRAVACHOL) 20 MG tablet, Take 1 tablet (20 mg total) by mouth 2 (two) times a week. (Patient taking differently: Take 20 mg by mouth 2 (two) times a week. Take on Monday and Friday Evenings), Disp: 40 tablet, Rfl: 0   pregabalin (LYRICA) 50 MG capsule, Take 1-2 capsules (50-100 mg total) by mouth at bedtime. (Patient taking differently: Take 50 mg by mouth at bedtime.), Disp: 60 capsule, Rfl: 6  Allergies:  Allergies  Allergen Reactions   Ditropan [Oxybutynin] Swelling and Other (See Comments)   Quinine Other (See Comments)    Platelets dropped- consumptive coagulopathy     Vibra-Tab [Doxycycline] Other (See Comments)    Epistaxis     Voltaren [Diclofenac] Swelling and Other (See Comments)    Elevated liver enzymes Lip swelling    Zoloft [Sertraline] Other (See Comments)    Insomnia Acute urinary retention   Cymbalta [Duloxetine Hcl] Other (See Comments)    Night sweats   Morphine And Related Nausea And Vomiting, Swelling and Other (See Comments)    Facial and lip swelling OK to use IR form of hydrocodone and oxycodone   Oxycontin [Oxycodone Hcl] Swelling and Other (See Comments)    Lip swelling OK to use IR form   Tape Other (See Comments)    Skin tears easily  SKIN IS FRAGILE- CERTAIN BANDAGES TEAR OFF THE SKIN, "BANDAIDS ARE OKAY."   Voltaren [Diclofenac Sodium] Other (See Comments)    Elevated liver enzymes   Zohydro Er [Hydrocodone Bitartrate Er] Swelling    Facial swelling OK to use IR form   Fentanyl Nausea And Vomiting and Other (See Comments)    Nausea, vomiting ad headaches - Per patient "okay to use nothing ever happened with it"   Neurontin [Gabapentin] Other (See Comments)    Unknown reaction     Past Medical History, Surgical history, Social history, and Family History were reviewed and updated.  Review of Systems: Review of Systems   Constitutional:  Positive for fatigue.  HENT:  Negative.    Eyes: Negative.   Respiratory: Negative.    Cardiovascular:  Positive for leg swelling.  Gastrointestinal: Negative.   Endocrine: Negative.   Genitourinary:  Positive for hematuria.   Musculoskeletal:  Positive for back pain, myalgias and neck pain.  Neurological:  Positive for dizziness and headaches.  Hematological: Negative.   Psychiatric/Behavioral: Negative.      Physical Exam:  height is 5\' 11"  (1.803 m) and weight is 262 lb (118.8 kg). His oral temperature is 98.9 F (37.2 C). His  blood pressure is 103/44 (abnormal) and his pulse is 76. His respiration is 19 and oxygen saturation is 96%.   Wt Readings from Last 3 Encounters:  06/19/22 262 lb (118.8 kg)  04/21/22 263 lb (119.3 kg)  04/19/22 266 lb (120.7 kg)    Physical Exam Vitals reviewed.  HENT:     Head: Normocephalic and atraumatic.  Eyes:     Pupils: Pupils are equal, round, and reactive to light.  Cardiovascular:     Rate and Rhythm: Normal rate and regular rhythm.     Heart sounds: Normal heart sounds.  Pulmonary:     Effort: Pulmonary effort is normal.     Breath sounds: Normal breath sounds.  Abdominal:     General: Bowel sounds are normal.     Palpations: Abdomen is soft.  Musculoskeletal:        General: No tenderness or deformity. Normal range of motion.     Cervical back: Normal range of motion.     Comments: His extremities does show some mild swelling in the right leg.  It is not as firm.  He has good warmth to the leg.  He has palpable pulses in the distal extremity.    Lymphadenopathy:     Cervical: No cervical adenopathy.  Skin:    General: Skin is warm and dry.     Findings: No erythema or rash.  Neurological:     Mental Status: He is alert and oriented to person, place, and time.  Psychiatric:        Behavior: Behavior normal.        Thought Content: Thought content normal.        Judgment: Judgment normal.     Lab Results   Component Value Date   WBC 6.1 06/19/2022   HGB 13.7 06/19/2022   HCT 42.9 06/19/2022   MCV 83.0 06/19/2022   PLT 423 (H) 06/19/2022     Chemistry      Component Value Date/Time   NA 137 06/19/2022 1516   K 4.4 06/19/2022 1516   CL 107 06/19/2022 1516   CO2 23 06/19/2022 1516   BUN 20 06/19/2022 1516   CREATININE 1.06 06/19/2022 1516      Component Value Date/Time   CALCIUM 10.8 (H) 06/19/2022 1516   ALKPHOS 65 06/19/2022 1516   AST 13 (L) 06/19/2022 1516   ALT 16 06/19/2022 1516   BILITOT 0.3 06/19/2022 1516       Impression and Plan: Derek [name] is a very nice 77 year old white male.  He has had incredible history.  He has had a past history of a neuroendocrine pancreatic tumor.  He apparently was hospitalized for 4 months because of complications.  He now has had recurrent thromboembolic disease.  Unfortunate, I think is going to need to be on Lovenox.  I just hate this for him.  However I do still see any other way around this.  Again if he needs to have a thrombectomy, I do not see a problem with this.  If he needs dental work, we can adjust his Lovenox.  If he needs to have a spinal stimulator, we can also adjust the Lovenox.  I just feel bad that he has all of his other health issues.  We will go ahead and plan to get him back in another 6 weeks or so.     Josph Macho, MD 4/29/20243:59 PM

## 2022-06-20 LAB — IRON AND IRON BINDING CAPACITY (CC-WL,HP ONLY)
Iron: 74 ug/dL (ref 45–182)
Saturation Ratios: 19 % (ref 17.9–39.5)
TIBC: 391 ug/dL (ref 250–450)
UIBC: 317 ug/dL (ref 117–376)

## 2022-06-22 ENCOUNTER — Other Ambulatory Visit (HOSPITAL_COMMUNITY): Payer: Self-pay | Admitting: Interventional Radiology

## 2022-06-22 DIAGNOSIS — I825Z1 Chronic embolism and thrombosis of unspecified deep veins of right distal lower extremity: Secondary | ICD-10-CM

## 2022-06-30 ENCOUNTER — Other Ambulatory Visit: Payer: Self-pay

## 2022-06-30 ENCOUNTER — Other Ambulatory Visit (HOSPITAL_BASED_OUTPATIENT_CLINIC_OR_DEPARTMENT_OTHER): Payer: Self-pay

## 2022-06-30 ENCOUNTER — Other Ambulatory Visit: Payer: Self-pay | Admitting: Hematology & Oncology

## 2022-06-30 MED ORDER — LANSOPRAZOLE 30 MG PO CPDR
30.0000 mg | DELAYED_RELEASE_CAPSULE | Freq: Every day | ORAL | 0 refills | Status: DC
  Filled 2022-06-30: qty 90, 90d supply, fill #0

## 2022-06-30 MED ORDER — ENOXAPARIN SODIUM 120 MG/0.8ML IJ SOSY
120.0000 mg | PREFILLED_SYRINGE | Freq: Two times a day (BID) | INTRAMUSCULAR | 0 refills | Status: DC
  Filled 2022-06-30: qty 48, 30d supply, fill #0
  Filled 2022-07-31: qty 12, 8d supply, fill #1

## 2022-07-03 DIAGNOSIS — Z7901 Long term (current) use of anticoagulants: Secondary | ICD-10-CM | POA: Diagnosis not present

## 2022-07-03 DIAGNOSIS — D682 Hereditary deficiency of other clotting factors: Secondary | ICD-10-CM | POA: Diagnosis not present

## 2022-07-03 DIAGNOSIS — E119 Type 2 diabetes mellitus without complications: Secondary | ICD-10-CM | POA: Diagnosis not present

## 2022-07-03 DIAGNOSIS — I10 Essential (primary) hypertension: Secondary | ICD-10-CM | POA: Diagnosis not present

## 2022-07-03 DIAGNOSIS — Z6836 Body mass index (BMI) 36.0-36.9, adult: Secondary | ICD-10-CM | POA: Diagnosis not present

## 2022-07-03 DIAGNOSIS — Z86718 Personal history of other venous thrombosis and embolism: Secondary | ICD-10-CM | POA: Diagnosis not present

## 2022-07-03 DIAGNOSIS — F331 Major depressive disorder, recurrent, moderate: Secondary | ICD-10-CM | POA: Diagnosis not present

## 2022-07-05 ENCOUNTER — Other Ambulatory Visit: Payer: Self-pay | Admitting: Radiology

## 2022-07-05 DIAGNOSIS — I824Y1 Acute embolism and thrombosis of unspecified deep veins of right proximal lower extremity: Secondary | ICD-10-CM

## 2022-07-05 NOTE — H&P (Signed)
Chief Complaint: Patient was seen in consultation today for extensive RLE DVT.  Referring Physician(s): Gildardo Cranker, MD (PCP)/Ennever, Theron Arista, MD (hematology)  Supervising Physician: Roanna Banning  Patient Status: [facility] - Out-pt  History of Present Illness: Race Hider. is a 77 y.o. 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 Milford Cage who presents today for RLE angiogram with possible intervention.   Mr. Moschella was most recently seen by Dr. Milford Cage in IR clinic 06/02/22 - please see this note for full details. Briefly, Mr. Devan has been followed by hematology for known factor V Leiden and was originally on Eliquis however this was briefly stopped and he developed RLE DVT which required thrombectomy 10/08/21 in IR. 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 He presented to the ED on 04/21/22 due to leg pain and IR was consulted for mechanical thrombectomy which was attempted 04/24/22 however the right SFV peripheral to the mid thigh was unable to be accessed and no thrombectomy was performed. Decision was made to proceed with conservative treatment at that time.  He was seen for follow up by Dr. Milford Cage 06/02/22 where he reported RLE below the knee edema and subsequently underwent RLE [place] which showed subacute-appearing occlusive right femoropopliteal DVT. He was found to be at moderate risk for post thrombotic syndrome and decision was made to proceed with repeat RLE angiogram with mechanical thrombectomy for which he presents today.  Past Medical History:   Diagnosis Date   Anxiety    Arthritis    Bilateral leg cramps    takes magnesium   BPH (benign prostatic hyperplasia)    DDD (degenerative disc disease), lumbar    gets back injections    Deafness in right ear    meniere's disease   Depression    Dermatitis    bilateral hands   Dyspnea    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" no pulmonary md, sees dr Daisy Floro   Factor V Leiden mutation (HCC)    "never had blood clots"   GERD (gastroesophageal reflux disease)    Hiatal hernia    History of kidney stones    multiple kidney stones-not a problem at present   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, pT1 pNX) no further treatment   History of panic attacks    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 SURGERY  09-21-2016 healed now   Peripheral vascular disease (HCC)    right leg - decreased pulse    Pleural effusion    Wears partial dentures    LOWER   Welders' lung (HCC)    chronic cough    Past Surgical History:  Procedure Laterality Date   ANAL FISTULECTOMY N/A 04/01/2013   Procedure: EXAM UNDER ANESTHESIA WITH ANAL FISSURectomy, sphincterotomy and internal hemorrhoidectomy;  Surgeon: Shelly Rubenstein, MD;  Location: [facility];  Service: General;  Laterality: N/A;   APPLICATION OF WOUND VAC N/A 05/16/2015   Procedure: APPLICATION OF  WOUND VAC;  Surgeon: Darnell Level, MD;  Location: [facility];  Service: General;  Laterality: N/A;   APPLICATION OF WOUND VAC N/A 05/20/2015   Procedure: EXHANGE OF ABDOMINAL  WOUND VAC DRESSING;  Surgeon: Darnell Level, MD;  Location: [facility];  Service: General;  Laterality: N/A;   COLONOSCOPY     CYSTOSCOPY N/A 05/23/2021   Procedure: Bluford Kaufmann;  Surgeon: Marcine Matar, MD;  Location: [facility];  Service: Urology;  Laterality: N/A;   CYSTOSCOPY WITH INSERTION OF UROLIFT N/A 10/12/2016   Procedure: CYSTOSCOPY WITH INSERTION OF UROLIFT;  Surgeon: Marcine Matar, MD;  Location: [facility];  Service: Urology;  Laterality: N/A;   EUS N/A 12/31/2014   Procedure: UPPER ENDOSCOPIC ULTRASOUND (EUS) LINEAR;  Surgeon: Rachael Fee, MD;  Location: [facility];  Service: Endoscopy;  Laterality: N/A;   HARDWARE REMOVAL Left 2013   left ankle   HEMORRHOIDECTOMY WITH HEMORRHOID BANDING     INCISION AND DRAINAGE ABSCESS N/A 05/14/2015   Procedure: DRAINAGE OF INFECTED PANCREATIC PSEUDOCYST;  Surgeon: Darnell Level, MD;  Location: [facility];  Service: General;  Laterality: N/A;   INCISION AND DRAINAGE OF WOUND N/A 05/16/2015   Procedure: IRRIGATION AND DEBRIDEMENT WOUND;  Surgeon: Darnell Level, MD;  Location: [facility];  Service: General;  Laterality: N/A;   INCISIONAL HERNIA REPAIR N/A 11/12/2015   Procedure: REPAIR INCISIONAL HERNIA WITH MESH;  Surgeon: Darnell Level, MD;  Location: [facility];  Service: General;  Laterality: N/A;   INGUINAL HERNIA REPAIR Right 1974;  1982   INSERTION OF MESH N/A 11/12/2015   Procedure: INSERTION OF MESH;  Surgeon: Darnell Level, MD;  Location: [facility];  Service: General;  Laterality: N/A;   IR GENERIC HISTORICAL  04/20/2015   IR RADIOLOGIST EVAL & MGMT 04/20/2015 [facility] INTERV RAD   IR IVC FILTER PLMT / S&I /IMG GUID/MOD SED  11/25/2020   IR RADIOLOGIST EVAL & MGMT  10/26/2021   IR RADIOLOGIST EVAL & MGMT  01/26/2022   IR RADIOLOGIST EVAL & MGMT  06/02/2022   IR  RADIOLOGY PERIPHERAL GUIDED IV START  11/25/2020   IR THROMBECT VENO MECH MOD SED  10/08/2021   IR US GUIDE VASC ACCESS LEFT  04/24/2022   IR US GUIDE VASC ACCESS RIGHT  11/25/2020   IR US GUIDE VASC ACCESS RIGHT  10/08/2021   IR VENO/EXT/UNI RIGHT  10/12/2021   IR VENO/EXT/UNI RIGHT  04/24/2022   IR VENOCAVAGRAM IVC  04/24/2022   KNEE ARTHROSCOPY Bilateral right 08-08-2000;  left 11-23-2000   meniscal repair and chondroplasty's   KNEE ARTHROSCOPY  03/01/2012   Procedure: ARTHROSCOPY KNEE;  Surgeon: Loanne Drilling, MD;  Location: [facility];  Service: Orthopedics;  Laterality: Left;  WITH SYNOVECTOMY    LAPAROTOMY N/A 05/14/2015   Procedure: EXPLORATORY LAPAROTOMY;  Surgeon: Darnell Level, MD;  Location: [facility];  Service: General;  Laterality: N/A;  MASS EXCISION N/A 12/24/2019   Procedure: EXCISION LIP CARCINOMA;  Surgeon: Newman Pies, MD;  Location: [facility];  Service: ENT;  Laterality: N/A;   ORIF LEFT ANKLE FX  08/20/2009   distal tib-fib and malleolus   ROTATOR CUFF REPAIR Bilateral right 1994; left 1993, left 1993   TOTAL KNEE ARTHROPLASTY Right 06/02/2013   Procedure: RIGHT TOTAL KNEE ARTHROPLASTY;  Surgeon: Loanne Drilling, MD;  Location: [facility];  Service: Orthopedics;  Laterality: Right;   TOTAL KNEE ARTHROPLASTY Left 01-31-2008   dr Lequita Halt   TOTAL KNEE REVISION Right 01/23/2018   Procedure: right knee polyethylene revision;  Surgeon: Ollen Gross, MD;  Location: [facility];  Service: Orthopedics;  Laterality: Right;    TRANSURETHRAL RESECTION OF BLADDER TUMOR N/A 05/23/2021   Procedure: TRANSURETHRAL BIOPSY OF BLADDER;  Surgeon: Marcine Matar, MD;  Location: [facility];  Service: Urology;  Laterality: N/A;   TRANSURETHRAL RESECTION OF PROSTATE N/A 04/21/2019   Procedure: TRANSURETHRAL RESECTION OF THE PROSTATE (TURP);  Surgeon: Marcine Matar, MD;  Location: [facility];  Service: Urology;  Laterality: N/A;   UMBILICAL HERNIA REPAIR  08-11-1999    dr Magnus Ivan   UPPER GASTROINTESTINAL ENDOSCOPY  sept 2016   WOUND EXPLORATION N/A 09/21/2016   Procedure: WOUND EXPLORATION AND DEBRIDEMENT ABDOMINAL WALL;  Surgeon: Darnell Level, MD;  Location: [facility];  Service: General;  Laterality: N/A;    Allergies: Ditropan [oxybutynin], Quinine, Vibra-tab [doxycycline], Voltaren [diclofenac], Zoloft [sertraline], Cymbalta [duloxetine hcl], Morphine and codeine, Oxycontin [oxycodone hcl], Tape, Voltaren [diclofenac sodium], Zohydro er [hydrocodone bitartrate er], Fentanyl, and Neurontin [gabapentin]  Medications: Prior to Admission medications   Medication Sig Start Date End Date Taking? Authorizing Provider  acetaminophen (TYLENOL) 500 MG tablet Take 500 mg by mouth every 6 (six) hours as needed for mild pain or headache.    [provider]  aspirin EC 81 MG tablet Take 81 mg by mouth daily. Swallow whole.    [provider]  carvedilol (COREG) 25 MG tablet Take 1 tablet by mouth once a day every evening 07/08/21     diphenhydramine-acetaminophen (TYLENOL PM) 25-500 MG TABS tablet Take 1 tablet by mouth at bedtime.    [provider]  docusate sodium (COLACE) 100 MG capsule Take 100 mg by mouth 2 (two) times daily.    [provider]  Dulaglutide (TRULICITY) 4.5 MG/0.5ML SOPN Inject 4.5 mg into the skin once a week. 03/10/22     empagliflozin (JARDIANCE) 25 MG TABS tablet Take 1 tablet (25 mg total) by mouth daily. 03/10/22     enoxaparin (LOVENOX) 120 MG/0.8ML injection Inject 0.8 mLs (120 mg total) into the skin every 12 (twelve) hours. 06/30/22 08/02/22  Josph Macho, MD  fluticasone (FLONASE) 50 MCG/ACT nasal spray Place 2 sprays into both nostrils daily. Patient taking differently: Place 1-2 sprays into both nostrils daily as needed for allergies. 02/21/22     furosemide (LASIX) 20 MG tablet Take 20 mg by mouth daily as needed for edema.    [provider]  Insulin Degludec FlexTouch 100 UNIT/ML SOPN Inject  70 Units into the skin daily. 03/10/22     Insulin Degludec FlexTouch 100 UNIT/ML SOPN Inject 55 Units into the skin daily. 06/05/22     lansoprazole (PREVACID) 30 MG capsule Take 1 capsule (30 mg total) by mouth daily before a meal. 06/30/22     levocetirizine (XYZAL) 5 MG tablet Take 5 mg by mouth every evening.    [provider]  lidocaine (LIDODERM) 5 % Place 1 patch onto the skin daily. Remove & Discard patch within 12 hours or as directed by MD 04/25/22   Lanae Boast, MD  losartan (COZAAR) 50 MG tablet Take 1 tablet by mouth once every evening 07/08/21     magnesium oxide (MAG-OX) 400 MG tablet Take 800 mg by mouth at bedtime.    [provider]  nystatin (MYCOSTATIN) 100000 UNIT/ML suspension Use 4-5 mL for Mouth/Throat as directed four times a day Patient taking differently: Take 5 mLs by mouth 4 (four) times daily as needed (thrush). 07/08/21     ondansetron (ZOFRAN-ODT) 4 MG disintegrating tablet Take 1 tablet (4 mg total) by mouth every 8 (eight) hours as needed for nausea or vomiting. 10/10/21   Amin, Loura Halt, MD  pravastatin (PRAVACHOL) 20 MG tablet Take 1 tablet (20 mg total) by mouth 2 (two) times a week. Patient taking differently: Take 20 mg by mouth 2 (two) times a week. Take on Monday and Friday Evenings 03/16/22     pregabalin (LYRICA) 50 MG capsule Take 1-2 capsules (50-100 mg total) by mouth at bedtime. Patient taking differently: Take 50 mg by mouth at bedtime. 03/09/22        Family History  Problem Relation Age of Onset   Bone cancer Father        Jaw   Heart disease Paternal Uncle    Diabetes Maternal Grandmother    Factor V Leiden deficiency Daughter    Colon cancer Neg Hx    Esophageal cancer Neg Hx    Stomach cancer Neg Hx    Rectal cancer Neg Hx    Migraines Neg Hx     Social History   Socioeconomic History   Marital status: Married    Spouse name: Jasmine December   Number of children: 2   Years of education: Not on file   Highest education  level: Not on file  Occupational History   Occupation: retired   Tobacco Use   Smoking status: Former    Packs/day: 4.00    Years: 17.00    Additional pack years: 0.00    Total pack years: 68.00    Types: Cigarettes    Quit date: 02/21/1975    Years since quitting: 47.4   Smokeless tobacco: Never  Vaping Use   Vaping Use: Never used  Substance and Sexual Activity   Alcohol use: No    Alcohol/week: 0.0 standard drinks of alcohol   Drug use: No   Sexual activity: Not Currently  Other Topics Concern   Not on file  Social History Narrative   Married, retired Therapist, art   1 son 1 daughter   2 caffeine free (will switch off once supply is finished with caffiene).       Social Determinants of Health   Financial Resource Strain: Not on file  Food Insecurity: No Food Insecurity (04/22/2022)   Hunger Vital Sign    Worried About Running Out of Food in the Last Year: Never true    Ran Out of Food in the Last Year: Never true  Transportation Needs: No Transportation Needs (04/22/2022)   PRAPARE - Administrator, Civil Service (Medical): No    Lack of Transportation (Non-Medical): No  Physical Activity: Not on file  Stress: Not on file  Social Connections: Not on file     Review of Systems: A 12 point ROS discussed and pertinent positives are indicated in the HPI above.  All other systems are  negative.  Review of Systems  Vital Signs: There were no vitals taken for this visit.  Physical Exam       Imaging: No results found.  Labs:  CBC: Recent Labs    04/23/22 0517 04/24/22 0427 04/25/22 0505 06/19/22 1516  WBC 8.1 7.7 8.6 6.1  HGB 11.4* 11.2* 11.2* 13.7  HCT 37.1* 36.2* 37.1* 42.9  PLT 527* 496* 462* 423*    COAGS: Recent Labs    10/06/21 1330 10/07/21 0223 10/08/21 0245 10/09/21 0357 10/09/21 1455 04/21/22 1715  INR 1.3*  --   --   --   --  1.5*  APTT 35   < > 100* 107* 78* 55*   < > = values in this interval not displayed.     BMP: Recent Labs    04/23/22 0517 04/24/22 0427 04/25/22 0505 06/19/22 1516  NA 137 137 136 137  K 3.6 3.9 4.4 4.4  CL 109 107 105 107  CO2 23 24 24 23   GLUCOSE 86 105* 97 190*  BUN 17 14 17 20   CALCIUM 10.0 10.0 10.1 10.8*  CREATININE 0.88 0.96 0.98 1.06  GFRNONAA >60 >60 >60 >60    LIVER FUNCTION TESTS: Recent Labs    04/23/22 0517 04/24/22 0427 04/25/22 0505 06/19/22 1516  BILITOT 0.5 0.6 0.4 0.3  AST 15 16 20  13*  ALT 13 13 15 16   ALKPHOS 70 66 70 65  PROT 6.7 6.3* 6.6 6.6  ALBUMIN 3.5 3.5 3.5 3.9    TUMOR MARKERS: No results for input(s): "AFPTM", "CEA", "CA199", "CHROMGRNA" in the last 8760 hours.  Assessment and Plan:  77 y/o M with history of factor V Leiden on chronic AC with history of multiple blood clots who is known to IR service from RLE mechanical thrombectomy 10/08/21 and RLE venogram with aborted intervention 04/24/22 who presents today for repeat RLE angiogram with possible mechanical thrombectomy.  Patient confirms code status to be: FULL CODE.   Risks and benefits of right lower extremity  arteriogram with intervention were discussed with the patient including, but not limited to bleeding, infection, vascular injury, contrast induced renal failure, stroke, reperfusion hemorrhage, or even death.  This interventional procedure involves the use of X-rays and because of the nature of the planned procedure, it is possible that we will have prolonged use of X-ray fluoroscopy. Potential radiation risks to you include (but are not limited to) the following: - A slightly elevated risk for cancer  several years later in life. This risk is typically less than 0.5% percent. This risk is low in comparison to the normal incidence of human cancer, which is 33% for women and 50% for men according to the [employer] Society. - Radiation induced injury can include skin redness, resembling a rash, tissue breakdown / ulcers and hair loss (which can be temporary  or permanent).  The likelihood of either of these occurring depends on the difficulty of the procedure and whether you are sensitive to radiation due to previous procedures, disease, or genetic conditions.  IF your procedure requires a prolonged use of radiation, you will be notified and given written instructions for further action.  It is your responsibility to monitor the irradiated area for the 2 weeks following the procedure and to notify your physician if you are concerned that you have suffered a radiation induced injury.    All of the patient's questions were answered, patient is agreeable to proceed.  Consent signed and in chart.  Thank you for this  interesting consult.  I greatly enjoyed meeting DAMICO TRZCINSKI Sr. and look forward to participating in their care.  A copy of this report was sent to the requesting provider on this date.  Electronically Signed: Villa Herb, PA-C 07/05/2022, 11:37 AM   I spent a total of  25 Minutes in face to face in clinical consultation, greater than 50% of which was counseling/coordinating care for recurrent RLE DVT.

## 2022-07-06 ENCOUNTER — Other Ambulatory Visit (HOSPITAL_COMMUNITY): Payer: Self-pay | Admitting: Interventional Radiology

## 2022-07-06 ENCOUNTER — Ambulatory Visit (HOSPITAL_COMMUNITY)
Admission: RE | Admit: 2022-07-06 | Discharge: 2022-07-06 | Disposition: A | Discharge status: Home / Self Care | Payer: PPO | Source: Ambulatory Visit | Attending: Interventional Radiology | Admitting: Interventional Radiology

## 2022-07-06 ENCOUNTER — Other Ambulatory Visit (HOSPITAL_COMMUNITY): Payer: Self-pay | Admitting: Physician Assistant

## 2022-07-06 VITALS — BP 129/83 | HR 58 | Temp 97.8°F | Resp 18 | Ht 71.0 in | Wt 146.0 lb

## 2022-07-06 DIAGNOSIS — I825Z1 Chronic embolism and thrombosis of unspecified deep veins of right distal lower extremity: Secondary | ICD-10-CM

## 2022-07-06 DIAGNOSIS — I82401 Acute embolism and thrombosis of unspecified deep veins of right lower extremity: Secondary | ICD-10-CM

## 2022-07-06 DIAGNOSIS — Z832 Family history of diseases of the blood and blood-forming organs and certain disorders involving the immune mechanism: Secondary | ICD-10-CM | POA: Diagnosis not present

## 2022-07-06 DIAGNOSIS — Z87891 Personal history of nicotine dependence: Secondary | ICD-10-CM | POA: Insufficient documentation

## 2022-07-06 DIAGNOSIS — I82411 Acute embolism and thrombosis of right femoral vein: Secondary | ICD-10-CM

## 2022-07-06 DIAGNOSIS — I82811 Embolism and thrombosis of superficial veins of right lower extremities: Secondary | ICD-10-CM | POA: Diagnosis not present

## 2022-07-06 DIAGNOSIS — I824Y1 Acute embolism and thrombosis of unspecified deep veins of right proximal lower extremity: Secondary | ICD-10-CM

## 2022-07-06 DIAGNOSIS — D6851 Activated protein C resistance: Secondary | ICD-10-CM | POA: Insufficient documentation

## 2022-07-06 HISTORY — PX: IR US GUIDE VASC ACCESS LEFT: IMG2389

## 2022-07-06 HISTORY — PX: IR VENO/EXT/UNI RIGHT: IMG676

## 2022-07-06 HISTORY — PX: IR US GUIDE VASC ACCESS RIGHT: IMG2390

## 2022-07-06 HISTORY — PX: IR PTA NON CORO-LOWER EXTREM: IMG6142

## 2022-07-06 LAB — CBC
HCT: 44.4 % (ref 39.0–52.0)
Hemoglobin: 14.1 g/dL (ref 13.0–17.0)
MCH: 26.7 pg (ref 26.0–34.0)
MCHC: 31.8 g/dL (ref 30.0–36.0)
MCV: 83.9 fL (ref 80.0–100.0)
Platelets: 463 10*3/uL — ABNORMAL HIGH (ref 150–400)
RBC: 5.29 MIL/uL (ref 4.22–5.81)
RDW: 18.6 % — ABNORMAL HIGH (ref 11.5–15.5)
WBC: 8 10*3/uL (ref 4.0–10.5)
nRBC: 0 % (ref 0.0–0.2)

## 2022-07-06 LAB — GLUCOSE, CAPILLARY
Glucose-Capillary: 119 mg/dL — ABNORMAL HIGH (ref 70–99)
Glucose-Capillary: 92 mg/dL (ref 70–99)

## 2022-07-06 LAB — PROTIME-INR
INR: 1 (ref 0.8–1.2)
Prothrombin Time: 13.3 seconds (ref 11.4–15.2)

## 2022-07-06 LAB — BASIC METABOLIC PANEL
Anion gap: 6 (ref 5–15)
BUN: 14 mg/dL (ref 8–23)
CO2: 22 mmol/L (ref 22–32)
Calcium: 10.7 mg/dL — ABNORMAL HIGH (ref 8.9–10.3)
Chloride: 108 mmol/L (ref 98–111)
Creatinine, Ser: 0.91 mg/dL (ref 0.61–1.24)
GFR, Estimated: >60 mL/min (ref >60)
Glucose, Bld: 126 mg/dL — ABNORMAL HIGH (ref 70–99)
Potassium: 3.7 mmol/L (ref 3.5–5.1)
Sodium: 136 mmol/L (ref 135–145)

## 2022-07-06 MED ORDER — ONDANSETRON HCL 4 MG/2ML IJ SOLN
INTRAMUSCULAR | Status: AC | PRN
  Administered 2022-07-06: 4 mg via INTRAVENOUS

## 2022-07-06 MED ORDER — IOHEXOL 300 MG/ML  SOLN
100.0000 mL | Freq: Once | INTRAMUSCULAR | Status: AC | PRN
  Administered 2022-07-06: 40 mL via INTRAVENOUS

## 2022-07-06 MED ORDER — SODIUM CHLORIDE 0.9 % IV SOLN: INTRAVENOUS | Status: DC

## 2022-07-06 MED ORDER — MIDAZOLAM HCL 2 MG/2ML IJ SOLN
INTRAMUSCULAR | Status: AC | PRN
  Administered 2022-07-06 (×7): .5 mg via INTRAVENOUS

## 2022-07-06 MED ORDER — FENTANYL CITRATE (PF) 100 MCG/2ML IJ SOLN
INTRAMUSCULAR | Status: AC
  Filled 2022-07-06: qty 2

## 2022-07-06 MED ORDER — ONDANSETRON HCL 4 MG/2ML IJ SOLN
INTRAMUSCULAR | Status: AC
  Filled 2022-07-06: qty 2

## 2022-07-06 MED ORDER — HEPARIN SODIUM (PORCINE) 1000 UNIT/ML IJ SOLN
INTRAMUSCULAR | Status: AC | PRN
  Administered 2022-07-06: 5000 [IU] via INTRAVENOUS

## 2022-07-06 MED ORDER — LIDOCAINE HCL 1 % IJ SOLN
INTRAMUSCULAR | Status: AC
  Filled 2022-07-06: qty 20

## 2022-07-06 MED ORDER — MIDAZOLAM HCL 2 MG/2ML IJ SOLN
INTRAMUSCULAR | Status: AC
  Filled 2022-07-06: qty 2

## 2022-07-06 MED ORDER — HEPARIN SODIUM (PORCINE) 1000 UNIT/ML IJ SOLN
INTRAMUSCULAR | Status: AC
  Filled 2022-07-06: qty 10

## 2022-07-06 MED ORDER — IOHEXOL 300 MG/ML  SOLN
100.0000 mL | Freq: Once | INTRAMUSCULAR | Status: AC | PRN
  Administered 2022-07-06: 60 mL via INTRAVENOUS

## 2022-07-06 MED ORDER — FENTANYL CITRATE (PF) 100 MCG/2ML IJ SOLN
INTRAMUSCULAR | Status: AC | PRN
  Administered 2022-07-06 (×3): 25 ug via INTRAVENOUS
  Administered 2022-07-06: 50 ug via INTRAVENOUS
  Administered 2022-07-06 (×2): 25 ug via INTRAVENOUS

## 2022-07-06 NOTE — Procedures (Signed)
Vascular and Interventional Radiology Procedure Note  Patient: Derek FAWCETT Sr. DOB: 10-Jan-1946 Medical Record Number: [id number] Note Date/Time: 07/06/22 9:35 AM   Performing Physician: Roanna Banning, MD Assistant(s): None  Diagnosis: Persistent, symptomatic RLE DVT despite AC   Procedure(s):  RIGHT LOWER EXTREMITY VENOGRAM BALLOON ANGIOPLASTY OF RIGHT FEMORAL VEIN *Aborted* MECHANICAL THROMBECTOMY for DVT   Anesthesia: Conscious Sedation Complications: None Estimated Blood Loss:  50 mL Specimens: Pathology   Findings:  - access via the LEFT greater saphenous vein - unable to cannulate RIGHT popliteal vein , antegrade - PTA of RIGHT femoral vein with 6 mm balloon - Post thrombotic venous change. *Aborted* catheter-directed thrombectomy   Plan: - RLE straight x 1 Hrs - Pt will follow up with me in Clinic, and for repeat RLE Venous Duplex in 3 months.  Final report to follow once all images are reviewed and compared with previous studies.  See detailed dictation with images in PACS. The patient tolerated the procedure well without incident or complication and was returned to Recovery in stable condition.    Roanna Banning, MD [facility] Radiology   Pager. [phone] Clinic. [phone]

## 2022-07-06 NOTE — Progress Notes (Signed)
Patient and wife was given discharge instructions. Both verbalized understanding. 

## 2022-07-10 ENCOUNTER — Other Ambulatory Visit (HOSPITAL_BASED_OUTPATIENT_CLINIC_OR_DEPARTMENT_OTHER): Payer: Self-pay

## 2022-07-10 DIAGNOSIS — E213 Hyperparathyroidism, unspecified: Secondary | ICD-10-CM | POA: Diagnosis not present

## 2022-07-10 DIAGNOSIS — Z794 Long term (current) use of insulin: Secondary | ICD-10-CM | POA: Diagnosis not present

## 2022-07-10 DIAGNOSIS — E782 Mixed hyperlipidemia: Secondary | ICD-10-CM | POA: Diagnosis not present

## 2022-07-10 DIAGNOSIS — Z7985 Long-term (current) use of injectable non-insulin antidiabetic drugs: Secondary | ICD-10-CM | POA: Diagnosis not present

## 2022-07-10 DIAGNOSIS — Z7984 Long term (current) use of oral hypoglycemic drugs: Secondary | ICD-10-CM | POA: Diagnosis not present

## 2022-07-10 DIAGNOSIS — E113393 Type 2 diabetes mellitus with moderate nonproliferative diabetic retinopathy without macular edema, bilateral: Secondary | ICD-10-CM | POA: Diagnosis not present

## 2022-07-10 MED ORDER — JARDIANCE 25 MG PO TABS
25.0000 mg | ORAL_TABLET | Freq: Every day | ORAL | 1 refills | Status: DC
  Filled 2022-11-22 (×2): qty 90, 90d supply, fill #0

## 2022-07-10 MED ORDER — INSULIN DEGLUDEC FLEXTOUCH 100 UNIT/ML ~~LOC~~ SOPN: PEN_INJECTOR | SUBCUTANEOUS | 1 refills | Status: DC

## 2022-07-10 MED ORDER — TRULICITY 4.5 MG/0.5ML ~~LOC~~ SOAJ
SUBCUTANEOUS | 1 refills | Status: DC
  Filled 2022-10-12: qty 2, 28d supply, fill #0
  Filled 2022-11-17: qty 2, 28d supply, fill #1
  Filled 2022-12-12: qty 2, 28d supply, fill #2

## 2022-07-11 ENCOUNTER — Other Ambulatory Visit (HOSPITAL_BASED_OUTPATIENT_CLINIC_OR_DEPARTMENT_OTHER): Payer: Self-pay

## 2022-07-11 MED ORDER — CLINDAMYCIN HCL 150 MG PO CAPS
150.0000 mg | ORAL_CAPSULE | Freq: Three times a day (TID) | ORAL | 0 refills | Status: DC
  Filled 2022-07-11: qty 22, 8d supply, fill #0

## 2022-07-13 DIAGNOSIS — E113393 Type 2 diabetes mellitus with moderate nonproliferative diabetic retinopathy without macular edema, bilateral: Secondary | ICD-10-CM | POA: Diagnosis not present

## 2022-07-14 DIAGNOSIS — E7800 Pure hypercholesterolemia, unspecified: Secondary | ICD-10-CM | POA: Diagnosis not present

## 2022-07-18 ENCOUNTER — Other Ambulatory Visit (HOSPITAL_BASED_OUTPATIENT_CLINIC_OR_DEPARTMENT_OTHER): Payer: Self-pay

## 2022-07-18 MED ORDER — CLINDAMYCIN HCL 150 MG PO CAPS
150.0000 mg | ORAL_CAPSULE | Freq: Three times a day (TID) | ORAL | 0 refills | Status: DC
  Filled 2022-07-18: qty 21, 7d supply, fill #0

## 2022-07-19 DIAGNOSIS — M533 Sacrococcygeal disorders, not elsewhere classified: Secondary | ICD-10-CM | POA: Diagnosis not present

## 2022-07-19 DIAGNOSIS — M5451 Vertebrogenic low back pain: Secondary | ICD-10-CM | POA: Diagnosis not present

## 2022-07-19 DIAGNOSIS — M47816 Spondylosis without myelopathy or radiculopathy, lumbar region: Secondary | ICD-10-CM | POA: Diagnosis not present

## 2022-07-19 DIAGNOSIS — G8929 Other chronic pain: Secondary | ICD-10-CM | POA: Diagnosis not present

## 2022-07-20 ENCOUNTER — Other Ambulatory Visit (HOSPITAL_BASED_OUTPATIENT_CLINIC_OR_DEPARTMENT_OTHER): Payer: Self-pay

## 2022-07-20 DIAGNOSIS — E7800 Pure hypercholesterolemia, unspecified: Secondary | ICD-10-CM | POA: Diagnosis not present

## 2022-07-20 DIAGNOSIS — L309 Dermatitis, unspecified: Secondary | ICD-10-CM | POA: Diagnosis not present

## 2022-07-20 DIAGNOSIS — Z1159 Encounter for screening for other viral diseases: Secondary | ICD-10-CM | POA: Diagnosis not present

## 2022-07-20 DIAGNOSIS — I10 Essential (primary) hypertension: Secondary | ICD-10-CM | POA: Diagnosis not present

## 2022-07-20 DIAGNOSIS — G8929 Other chronic pain: Secondary | ICD-10-CM | POA: Diagnosis not present

## 2022-07-20 DIAGNOSIS — F331 Major depressive disorder, recurrent, moderate: Secondary | ICD-10-CM | POA: Diagnosis not present

## 2022-07-20 DIAGNOSIS — Z0000 Encounter for general adult medical examination without abnormal findings: Secondary | ICD-10-CM | POA: Diagnosis not present

## 2022-07-20 MED ORDER — CARVEDILOL 25 MG PO TABS
25.0000 mg | ORAL_TABLET | Freq: Every evening | ORAL | 4 refills | Status: DC
  Filled 2022-07-20 – 2022-09-09 (×2): qty 90, 90d supply, fill #0
  Filled 2022-12-12: qty 90, 90d supply, fill #1
  Filled 2023-03-13: qty 90, 90d supply, fill #2
  Filled 2023-06-06: qty 90, 90d supply, fill #3

## 2022-07-20 MED ORDER — LOSARTAN POTASSIUM 50 MG PO TABS
50.0000 mg | ORAL_TABLET | Freq: Every day | ORAL | 4 refills | Status: DC
  Filled 2022-07-20 – 2022-09-09 (×2): qty 90, 90d supply, fill #0
  Filled 2022-12-12: qty 90, 90d supply, fill #1
  Filled 2023-03-13: qty 90, 90d supply, fill #2
  Filled 2023-06-06: qty 90, 90d supply, fill #3

## 2022-07-20 MED ORDER — PRAVASTATIN SODIUM 20 MG PO TABS
20.0000 mg | ORAL_TABLET | ORAL | 4 refills | Status: DC
  Filled 2022-07-20: qty 40, 140d supply, fill #0
  Filled 2022-08-07: qty 24, 84d supply, fill #0
  Filled 2022-10-30: qty 24, 84d supply, fill #1
  Filled 2023-01-20: qty 24, 84d supply, fill #2
  Filled 2023-04-23: qty 24, 84d supply, fill #3
  Filled 2023-07-17: qty 24, 84d supply, fill #4

## 2022-07-20 MED ORDER — PREGABALIN 50 MG PO CAPS
50.0000 mg | ORAL_CAPSULE | Freq: Every evening | ORAL | 1 refills | Status: DC
  Filled 2022-07-20: qty 90, 90d supply, fill #0

## 2022-07-20 MED ORDER — TRIAMCINOLONE ACETONIDE 0.1 % EX CREA
1.0000 | TOPICAL_CREAM | Freq: Two times a day (BID) | CUTANEOUS | 3 refills | Status: DC
  Filled 2022-07-20: qty 80, 40d supply, fill #0
  Filled 2022-07-21: qty 90, 45d supply, fill #0

## 2022-07-21 ENCOUNTER — Other Ambulatory Visit: Payer: Self-pay

## 2022-07-21 ENCOUNTER — Other Ambulatory Visit (HOSPITAL_BASED_OUTPATIENT_CLINIC_OR_DEPARTMENT_OTHER): Payer: Self-pay

## 2022-07-24 ENCOUNTER — Inpatient Hospital Stay: Payer: PPO | Attending: Hematology & Oncology

## 2022-07-24 ENCOUNTER — Encounter: Payer: Self-pay | Admitting: Hematology & Oncology

## 2022-07-24 ENCOUNTER — Inpatient Hospital Stay (HOSPITAL_BASED_OUTPATIENT_CLINIC_OR_DEPARTMENT_OTHER): Payer: PPO | Admitting: Hematology & Oncology

## 2022-07-24 VITALS — BP 118/67 | HR 84 | Temp 97.7°F | Resp 18 | Wt 261.0 lb

## 2022-07-24 DIAGNOSIS — D3A8 Other benign neuroendocrine tumors: Secondary | ICD-10-CM

## 2022-07-24 DIAGNOSIS — I82409 Acute embolism and thrombosis of unspecified deep veins of unspecified lower extremity: Secondary | ICD-10-CM

## 2022-07-24 DIAGNOSIS — D6851 Activated protein C resistance: Secondary | ICD-10-CM | POA: Diagnosis not present

## 2022-07-24 DIAGNOSIS — E119 Type 2 diabetes mellitus without complications: Secondary | ICD-10-CM | POA: Insufficient documentation

## 2022-07-24 DIAGNOSIS — I82401 Acute embolism and thrombosis of unspecified deep veins of right lower extremity: Secondary | ICD-10-CM | POA: Diagnosis not present

## 2022-07-24 DIAGNOSIS — Z794 Long term (current) use of insulin: Secondary | ICD-10-CM | POA: Diagnosis not present

## 2022-07-24 LAB — CBC WITH DIFFERENTIAL (CANCER CENTER ONLY)
Abs Immature Granulocytes: 0.01 10*3/uL (ref 0.00–0.07)
Basophils Absolute: 0.2 10*3/uL — ABNORMAL HIGH (ref 0.0–0.1)
Basophils Relative: 2 %
Eosinophils Absolute: 0.4 10*3/uL (ref 0.0–0.5)
Eosinophils Relative: 6 %
HCT: 43.6 % (ref 39.0–52.0)
Hemoglobin: 14 g/dL (ref 13.0–17.0)
Immature Granulocytes: 0 %
Lymphocytes Relative: 35 %
Lymphs Abs: 2.5 10*3/uL (ref 0.7–4.0)
MCH: 27.2 pg (ref 26.0–34.0)
MCHC: 32.1 g/dL (ref 30.0–36.0)
MCV: 84.7 fL (ref 80.0–100.0)
Monocytes Absolute: 0.8 10*3/uL (ref 0.1–1.0)
Monocytes Relative: 12 %
Neutro Abs: 3.1 10*3/uL (ref 1.7–7.7)
Neutrophils Relative %: 45 %
Platelet Count: 447 10*3/uL — ABNORMAL HIGH (ref 150–400)
RBC: 5.15 MIL/uL (ref 4.22–5.81)
RDW: 18 % — ABNORMAL HIGH (ref 11.5–15.5)
WBC Count: 7 10*3/uL (ref 4.0–10.5)
nRBC: 0 % (ref 0.0–0.2)

## 2022-07-24 LAB — CMP (CANCER CENTER ONLY)
ALT: 21 U/L (ref 0–44)
AST: 17 U/L (ref 15–41)
Albumin: 4.1 g/dL (ref 3.5–5.0)
Alkaline Phosphatase: 79 U/L (ref 38–126)
Anion gap: 6 (ref 5–15)
BUN: 19 mg/dL (ref 8–23)
CO2: 23 mmol/L (ref 22–32)
Calcium: 10.8 mg/dL — ABNORMAL HIGH (ref 8.9–10.3)
Chloride: 106 mmol/L (ref 98–111)
Creatinine: 1.14 mg/dL (ref 0.61–1.24)
GFR, Estimated: >60 mL/min (ref >60)
Glucose, Bld: 262 mg/dL — ABNORMAL HIGH (ref 70–99)
Potassium: 4.3 mmol/L (ref 3.5–5.1)
Sodium: 135 mmol/L (ref 135–145)
Total Bilirubin: 0.4 mg/dL (ref 0.3–1.2)
Total Protein: 7.3 g/dL (ref 6.5–8.1)

## 2022-07-24 LAB — LACTATE DEHYDROGENASE: LDH: 161 U/L (ref 98–192)

## 2022-07-24 NOTE — Progress Notes (Signed)
Hematology and Oncology Follow Up Visit  Derek [name] [id number] 08/01/1945 77 y.o. 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  As far as blood clots ago, he is about the same.  I know that Interventional radiology is try to get an to pull the blood clot out of the right leg.  Unfortunately, interventional radiology just could not pull the thrombus out.  It sounds like they will try once again in July.  He is still having a lot of problems with his diabetes.  His blood sugar today is 262.  He is being followed by Endocrinology.  They are trying her best to get his blood sugars under better control.  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  He has chronic back issues.  He has abdominal issues now.  It sounds like he may have some hernias where he had a mesh placed previously.  I know that he just has a lot of pain issues.  It sounds like he is going to have some sacroiliac injections.  He really is not a candidate for the implantable stimulator because of the Lovenox.  There has been no issues with fever.  He has had no obvious bleeding.  He is going to the bathroom okay.  Overall, I would say that his performance status is probably ECOG 2.   .    Medications:  Current Outpatient Medications:    acetaminophen (TYLENOL) 500 MG tablet, Take 500 mg by mouth every 6 (six) hours as needed for mild pain or headache., Disp: , Rfl:    aspirin EC 81 MG tablet, Take 81 mg by mouth daily. Swallow whole., Disp: , Rfl:    carvedilol (COREG) 25 MG tablet, Take 1 tablet (25 mg total) by mouth every evening., Disp: 90 tablet, Rfl: 4   clindamycin (CLEOCIN) 150 MG capsule, Take 2 capsules (300 mg total) by mouth now, and then take 1 capsule (150 mg total) by mouth 3 (three) times daily unitl gone., Disp: 21  capsule, Rfl: 0   diphenhydramine-acetaminophen (TYLENOL PM) 25-500 MG TABS tablet, Take 1 tablet by mouth at bedtime., Disp: , Rfl:    diphenhydramine-acetaminophen (TYLENOL PM) 25-500 MG TABS tablet, Take 1 tablet by mouth at bedtime as needed., Disp: , Rfl:    docusate sodium (COLACE) 100 MG capsule, Take 100 mg by mouth 2 (two) times daily as needed., Disp: , Rfl:    Dulaglutide (TRULICITY) 4.5 MG/0.5ML SOPN, Inject 0.5 mL (4.5 mg total) under the skin every 7 days., Disp: 6 mL, Rfl: 1   empagliflozin (JARDIANCE) 25 MG TABS tablet, Take 1 tablet (25 mg total) by mouth daily., Disp: 90 tablet, Rfl: 1   enoxaparin (LOVENOX) 120 MG/0.8ML injection, Inject 0.8 mLs (120 mg total) into the skin every 12 (twelve) hours., Disp: 60 mL, Rfl: 0   fluticasone (FLONASE) 50 MCG/ACT nasal spray, Place 2 sprays into both nostrils daily. (Patient taking differently: Place 1-2 sprays into both nostrils daily as needed for allergies.), Disp: 16 g, Rfl: 5   furosemide (LASIX) 20 MG tablet, Take 20 mg by mouth daily as needed for edema., Disp: , Rfl:    Insulin Degludec FlexTouch 100 UNIT/ML SOPN, Inject 55 Units into the skin daily. (Patient taking differently: Inject 45 Units into the skin daily.), Disp: 60  mL, Rfl: 1   lansoprazole (PREVACID) 30 MG capsule, Take 1 capsule (30 mg total) by mouth daily before a meal., Disp: 90 capsule, Rfl: 0   levocetirizine (XYZAL) 5 MG tablet, Take 5 mg by mouth every evening., Disp: , Rfl:    losartan (COZAAR) 50 MG tablet, Take 1 tablet (50 mg total) by mouth daily., Disp: 90 tablet, Rfl: 4   nystatin (MYCOSTATIN) 100000 UNIT/ML suspension, Use 4-5 mL for Mouth/Throat as directed four times a day (Patient taking differently: Take 5 mLs by mouth 4 (four) times daily as needed (thrush).), Disp: 240 mL, Rfl: 3   ondansetron (ZOFRAN-ODT) 4 MG disintegrating tablet, Take 1 tablet (4 mg total) by mouth every 8 (eight) hours as needed for nausea or vomiting., Disp: 30 tablet, Rfl: 0    pravastatin (PRAVACHOL) 20 MG tablet, Take 1 tablet (20 mg total) by mouth 2 (two) times a week., Disp: 40 tablet, Rfl: 4   pregabalin (LYRICA) 50 MG capsule, Take 1-2 capsules (50-100 mg total) by mouth at bedtime. (Patient taking differently: Take 50 mg by mouth at bedtime.), Disp: 60 capsule, Rfl: 6   pregabalin (LYRICA) 50 MG capsule, Take 1 capsule (50 mg total) by mouth every evening, 1 to 3 hours before bedtime., Disp: 90 capsule, Rfl: 1   triamcinolone cream (KENALOG) 0.1 %, Apply 1 application topically to affected area(s) 2 (two) times daily., Disp: 80 g, Rfl: 3   Dulaglutide (TRULICITY) 4.5 MG/0.5ML SOPN, Inject 4.5 mg into the skin once a week. (Patient not taking: Reported on 07/24/2022), Disp: 6 mL, Rfl: 1   empagliflozin (JARDIANCE) 25 MG TABS tablet, Take 1 tablet (25 mg total) by mouth daily. (Patient not taking: Reported on 07/24/2022), Disp: 90 tablet, Rfl: 1   Insulin Degludec FlexTouch 100 UNIT/ML SOPN, Inject 70 Units into the skin daily. (Patient not taking: Reported on 07/24/2022), Disp: 60 mL, Rfl: 1   Insulin Degludec FlexTouch 100 UNIT/ML SOPN, Inject 55 units under the skin once daily. (Patient not taking: Reported on 07/24/2022), Disp: 60 mL, Rfl: 1   lidocaine (LIDODERM) 5 %, Place 1 patch onto the skin daily. Remove & Discard patch within 12 hours or as directed by MD (Patient not taking: Reported on 07/24/2022), Disp: 30 patch, Rfl: 0   magnesium oxide (MAG-OX) 400 MG tablet, Take 800 mg by mouth at bedtime. (Patient not taking: Reported on 07/24/2022), Disp: , Rfl:   Allergies:  Allergies  Allergen Reactions   Ditropan [Oxybutynin] Swelling and Other (See Comments)   Quinine Other (See Comments)    Platelets dropped- consumptive coagulopathy     Vibra-Tab [Doxycycline] Other (See Comments)    Epistaxis     Voltaren [Diclofenac] Swelling and Other (See Comments)    Elevated liver enzymes Lip swelling    Zoloft [Sertraline] Other (See Comments)    Insomnia Acute urinary  retention   Cymbalta [Duloxetine Hcl] Other (See Comments)    Night sweats   Morphine And Codeine Nausea And Vomiting, Swelling and Other (See Comments)    Facial and lip swelling OK to use IR form of hydrocodone and oxycodone   Oxycontin [Oxycodone Hcl] Swelling and Other (See Comments)    Lip swelling OK to use IR form   Tape Other (See Comments)    Skin tears easily  SKIN IS FRAGILE- CERTAIN BANDAGES TEAR OFF THE SKIN, "BANDAIDS ARE OKAY."   Voltaren [Diclofenac Sodium] Other (See Comments)    Elevated liver enzymes   Zohydro Er [Hydrocodone Bitartrate Er] Swelling  Facial swelling OK to use IR form   Fentanyl Nausea And Vomiting and Other (See Comments)    Nausea, vomiting ad headaches - Per patient "okay to use nothing ever happened with it"   Neurontin [Gabapentin] Other (See Comments)    Unknown reaction     Past Medical History, Surgical history, Social history, and Family History were reviewed and updated.  Review of Systems: Review of Systems  Constitutional:  Positive for fatigue.  HENT:  Negative.    Eyes: Negative.   Respiratory: Negative.    Cardiovascular:  Positive for leg swelling.  Gastrointestinal: Negative.   Endocrine: Negative.   Genitourinary:  Positive for hematuria.   Musculoskeletal:  Positive for back pain, myalgias and neck pain.  Neurological:  Positive for dizziness and headaches.  Hematological: Negative.   Psychiatric/Behavioral: Negative.      Physical Exam:  weight is 261 lb 0.6 oz (118.4 kg). His oral temperature is 97.7 F (36.5 C). His blood pressure is 118/67 and his pulse is 84. His respiration is 18 and oxygen saturation is 100%.   Wt Readings from Last 3 Encounters:  07/24/22 261 lb 0.6 oz (118.4 kg)  07/06/22 146 lb (66.2 kg)  06/19/22 262 lb (118.8 kg)    Physical Exam Vitals reviewed.  HENT:     Head: Normocephalic and atraumatic.  Eyes:     Pupils: Pupils are equal, round, and reactive to light.  Cardiovascular:      Rate and Rhythm: Normal rate and regular rhythm.     Heart sounds: Normal heart sounds.  Pulmonary:     Effort: Pulmonary effort is normal.     Breath sounds: Normal breath sounds.  Abdominal:     General: Bowel sounds are normal.     Palpations: Abdomen is soft.  Musculoskeletal:        General: No tenderness or deformity. Normal range of motion.     Cervical back: Normal range of motion.     Comments: His extremities does show some mild swelling in the right leg.  It is not as firm.  He has good warmth to the leg.  He has palpable pulses in the distal extremity.    Lymphadenopathy:     Cervical: No cervical adenopathy.  Skin:    General: Skin is warm and dry.     Findings: No erythema or rash.  Neurological:     Mental Status: He is alert and oriented to person, place, and time.  Psychiatric:        Behavior: Behavior normal.        Thought Content: Thought content normal.        Judgment: Judgment normal.      Lab Results  Component Value Date   WBC 7.0 07/24/2022   HGB 14.0 07/24/2022   HCT 43.6 07/24/2022   MCV 84.7 07/24/2022   PLT 447 (H) 07/24/2022     Chemistry      Component Value Date/Time   NA 135 07/24/2022 1439   K 4.3 07/24/2022 1439   CL 106 07/24/2022 1439   CO2 23 07/24/2022 1439   BUN 19 07/24/2022 1439   CREATININE 1.14 07/24/2022 1439      Component Value Date/Time   CALCIUM 10.8 (H) 07/24/2022 1439   ALKPHOS 79 07/24/2022 1439   AST 17 07/24/2022 1439   ALT 21 07/24/2022 1439   BILITOT 0.4 07/24/2022 1439       Impression and Plan: Derek [name] is a very nice 77 year old white  male.  He has had incredible history.  He has had a past history of a neuroendocrine pancreatic tumor.  He apparently was hospitalized for 4 months because of complications.  He now has had recurrent thromboembolic disease.  He is on lifelong Lovenox.  We have tried everything else.  It seems like everything else that we have tried just does not hold for a long  time.  I think his prognosis clearly will be dictated by his diabetes.  He just has a very hard time getting the diabetes under good control.  For right now, we will plan to get him back in a couple months.  And we will see how everything goes with Interventional Radiology.  Maybe they will be able to remove the thrombus from the right thigh.  I know that they are incredibly skilled and very determined.      Josph Macho, MD 6/3/20243:25 PM

## 2022-07-25 ENCOUNTER — Ambulatory Visit
Admission: RE | Admit: 2022-07-25 | Discharge: 2022-07-25 | Disposition: A | Discharge status: Home / Self Care | Payer: PPO | Source: Ambulatory Visit | Attending: Family Medicine | Admitting: Family Medicine

## 2022-07-25 ENCOUNTER — Other Ambulatory Visit (HOSPITAL_BASED_OUTPATIENT_CLINIC_OR_DEPARTMENT_OTHER): Payer: Self-pay

## 2022-07-25 DIAGNOSIS — R599 Enlarged lymph nodes, unspecified: Secondary | ICD-10-CM | POA: Diagnosis not present

## 2022-07-28 DIAGNOSIS — M533 Sacrococcygeal disorders, not elsewhere classified: Secondary | ICD-10-CM | POA: Diagnosis not present

## 2022-07-31 ENCOUNTER — Other Ambulatory Visit (HOSPITAL_BASED_OUTPATIENT_CLINIC_OR_DEPARTMENT_OTHER): Payer: Self-pay

## 2022-08-02 ENCOUNTER — Other Ambulatory Visit (HOSPITAL_BASED_OUTPATIENT_CLINIC_OR_DEPARTMENT_OTHER): Payer: Self-pay

## 2022-08-02 DIAGNOSIS — N323 Diverticulum of bladder: Secondary | ICD-10-CM | POA: Diagnosis not present

## 2022-08-02 DIAGNOSIS — R351 Nocturia: Secondary | ICD-10-CM | POA: Diagnosis not present

## 2022-08-02 DIAGNOSIS — N401 Enlarged prostate with lower urinary tract symptoms: Secondary | ICD-10-CM | POA: Diagnosis not present

## 2022-08-02 DIAGNOSIS — R310 Gross hematuria: Secondary | ICD-10-CM | POA: Diagnosis not present

## 2022-08-07 ENCOUNTER — Other Ambulatory Visit: Payer: Self-pay | Admitting: Hematology & Oncology

## 2022-08-07 ENCOUNTER — Other Ambulatory Visit (HOSPITAL_BASED_OUTPATIENT_CLINIC_OR_DEPARTMENT_OTHER): Payer: Self-pay

## 2022-08-07 ENCOUNTER — Other Ambulatory Visit: Payer: Self-pay

## 2022-08-07 MED ORDER — ENOXAPARIN SODIUM 120 MG/0.8ML IJ SOSY
120.0000 mg | PREFILLED_SYRINGE | Freq: Two times a day (BID) | INTRAMUSCULAR | 0 refills | Status: DC
  Filled 2022-08-07: qty 24, 15d supply, fill #0
  Filled 2022-08-07: qty 36, 23d supply, fill #0

## 2022-08-13 DIAGNOSIS — E113393 Type 2 diabetes mellitus with moderate nonproliferative diabetic retinopathy without macular edema, bilateral: Secondary | ICD-10-CM | POA: Diagnosis not present

## 2022-08-28 ENCOUNTER — Other Ambulatory Visit (HOSPITAL_BASED_OUTPATIENT_CLINIC_OR_DEPARTMENT_OTHER): Payer: Self-pay

## 2022-09-05 DIAGNOSIS — M545 Low back pain, unspecified: Secondary | ICD-10-CM | POA: Diagnosis not present

## 2022-09-05 DIAGNOSIS — R1030 Lower abdominal pain, unspecified: Secondary | ICD-10-CM | POA: Diagnosis not present

## 2022-09-05 DIAGNOSIS — Z6836 Body mass index (BMI) 36.0-36.9, adult: Secondary | ICD-10-CM | POA: Diagnosis not present

## 2022-09-07 ENCOUNTER — Other Ambulatory Visit: Payer: Self-pay | Admitting: Family Medicine

## 2022-09-07 DIAGNOSIS — Z8507 Personal history of malignant neoplasm of pancreas: Secondary | ICD-10-CM

## 2022-09-07 DIAGNOSIS — M545 Low back pain, unspecified: Secondary | ICD-10-CM

## 2022-09-07 DIAGNOSIS — R1030 Lower abdominal pain, unspecified: Secondary | ICD-10-CM

## 2022-09-11 ENCOUNTER — Other Ambulatory Visit: Payer: Self-pay | Admitting: Hematology & Oncology

## 2022-09-11 ENCOUNTER — Other Ambulatory Visit (HOSPITAL_BASED_OUTPATIENT_CLINIC_OR_DEPARTMENT_OTHER): Payer: Self-pay

## 2022-09-11 MED ORDER — ENOXAPARIN SODIUM 120 MG/0.8ML IJ SOSY
120.0000 mg | PREFILLED_SYRINGE | Freq: Two times a day (BID) | INTRAMUSCULAR | 0 refills | Status: DC
  Filled 2022-09-11: qty 48, 30d supply, fill #0

## 2022-09-12 ENCOUNTER — Other Ambulatory Visit: Payer: HMO

## 2022-09-18 DIAGNOSIS — E113393 Type 2 diabetes mellitus with moderate nonproliferative diabetic retinopathy without macular edema, bilateral: Secondary | ICD-10-CM | POA: Diagnosis not present

## 2022-09-19 ENCOUNTER — Ambulatory Visit: Admission: RE | Admit: 2022-09-19 | Payer: HMO | Source: Ambulatory Visit

## 2022-09-19 ENCOUNTER — Other Ambulatory Visit (HOSPITAL_COMMUNITY): Payer: Self-pay | Admitting: Family Medicine

## 2022-09-19 DIAGNOSIS — Z8507 Personal history of malignant neoplasm of pancreas: Secondary | ICD-10-CM

## 2022-09-19 DIAGNOSIS — K7689 Other specified diseases of liver: Secondary | ICD-10-CM | POA: Diagnosis not present

## 2022-09-19 DIAGNOSIS — R1030 Lower abdominal pain, unspecified: Secondary | ICD-10-CM

## 2022-09-19 DIAGNOSIS — M545 Low back pain, unspecified: Secondary | ICD-10-CM | POA: Diagnosis not present

## 2022-09-19 DIAGNOSIS — R109 Unspecified abdominal pain: Secondary | ICD-10-CM | POA: Diagnosis not present

## 2022-09-21 ENCOUNTER — Encounter (HOSPITAL_COMMUNITY): Payer: Self-pay

## 2022-09-21 ENCOUNTER — Ambulatory Visit (HOSPITAL_COMMUNITY)
Admission: RE | Admit: 2022-09-21 | Discharge: 2022-09-21 | Disposition: A | Discharge status: Home / Self Care | Payer: PPO | Source: Ambulatory Visit | Attending: Family Medicine | Admitting: Family Medicine

## 2022-09-21 DIAGNOSIS — Z8507 Personal history of malignant neoplasm of pancreas: Secondary | ICD-10-CM | POA: Insufficient documentation

## 2022-09-21 DIAGNOSIS — R1030 Lower abdominal pain, unspecified: Secondary | ICD-10-CM | POA: Insufficient documentation

## 2022-09-21 DIAGNOSIS — E119 Type 2 diabetes mellitus without complications: Secondary | ICD-10-CM | POA: Insufficient documentation

## 2022-09-21 DIAGNOSIS — K4090 Unilateral inguinal hernia, without obstruction or gangrene, not specified as recurrent: Secondary | ICD-10-CM | POA: Diagnosis not present

## 2022-09-21 DIAGNOSIS — K8020 Calculus of gallbladder without cholecystitis without obstruction: Secondary | ICD-10-CM | POA: Diagnosis not present

## 2022-09-21 DIAGNOSIS — K439 Ventral hernia without obstruction or gangrene: Secondary | ICD-10-CM | POA: Diagnosis not present

## 2022-09-21 DIAGNOSIS — K449 Diaphragmatic hernia without obstruction or gangrene: Secondary | ICD-10-CM | POA: Diagnosis not present

## 2022-09-21 LAB — POCT I-STAT CREATININE: Creatinine, Ser: 1.2 mg/dL (ref 0.61–1.24)

## 2022-09-21 MED ORDER — IOHEXOL 300 MG/ML  SOLN
100.0000 mL | Freq: Once | INTRAMUSCULAR | Status: AC | PRN
  Administered 2022-09-21: 100 mL via INTRAVENOUS

## 2022-09-21 MED ORDER — SODIUM CHLORIDE (PF) 0.9 % IJ SOLN
INTRAMUSCULAR | Status: AC
  Filled 2022-09-21: qty 50

## 2022-09-25 ENCOUNTER — Other Ambulatory Visit (HOSPITAL_BASED_OUTPATIENT_CLINIC_OR_DEPARTMENT_OTHER): Payer: Self-pay

## 2022-09-25 MED ORDER — LANSOPRAZOLE 30 MG PO CPDR
30.0000 mg | DELAYED_RELEASE_CAPSULE | Freq: Every day | ORAL | 1 refills | Status: DC
  Filled 2022-09-25: qty 90, 90d supply, fill #0
  Filled 2022-12-26: qty 90, 90d supply, fill #1

## 2022-09-26 ENCOUNTER — Other Ambulatory Visit (HOSPITAL_BASED_OUTPATIENT_CLINIC_OR_DEPARTMENT_OTHER): Payer: Self-pay

## 2022-09-26 MED ORDER — METRONIDAZOLE 500 MG PO TABS
500.0000 mg | ORAL_TABLET | Freq: Three times a day (TID) | ORAL | 0 refills | Status: DC
  Filled 2022-09-26: qty 30, 10d supply, fill #0

## 2022-09-27 ENCOUNTER — Other Ambulatory Visit (HOSPITAL_BASED_OUTPATIENT_CLINIC_OR_DEPARTMENT_OTHER): Payer: Self-pay

## 2022-09-27 ENCOUNTER — Inpatient Hospital Stay: Payer: PPO | Attending: Hematology & Oncology

## 2022-09-27 ENCOUNTER — Encounter: Payer: Self-pay | Admitting: Hematology & Oncology

## 2022-09-27 ENCOUNTER — Inpatient Hospital Stay (HOSPITAL_BASED_OUTPATIENT_CLINIC_OR_DEPARTMENT_OTHER): Payer: PPO | Admitting: Hematology & Oncology

## 2022-09-27 ENCOUNTER — Other Ambulatory Visit: Payer: Self-pay

## 2022-09-27 VITALS — BP 137/78 | HR 77 | Temp 98.0°F | Resp 19 | Ht 71.0 in | Wt 260.0 lb

## 2022-09-27 DIAGNOSIS — D3A8 Other benign neuroendocrine tumors: Secondary | ICD-10-CM | POA: Diagnosis not present

## 2022-09-27 DIAGNOSIS — D6851 Activated protein C resistance: Secondary | ICD-10-CM | POA: Insufficient documentation

## 2022-09-27 DIAGNOSIS — K529 Noninfective gastroenteritis and colitis, unspecified: Secondary | ICD-10-CM | POA: Insufficient documentation

## 2022-09-27 DIAGNOSIS — I82401 Acute embolism and thrombosis of unspecified deep veins of right lower extremity: Secondary | ICD-10-CM

## 2022-09-27 DIAGNOSIS — I82412 Acute embolism and thrombosis of left femoral vein: Secondary | ICD-10-CM | POA: Diagnosis not present

## 2022-09-27 DIAGNOSIS — Z8507 Personal history of malignant neoplasm of pancreas: Secondary | ICD-10-CM | POA: Insufficient documentation

## 2022-09-27 LAB — CBC WITH DIFFERENTIAL (CANCER CENTER ONLY)
Abs Immature Granulocytes: 0.02 10*3/uL (ref 0.00–0.07)
Basophils Absolute: 0.1 10*3/uL (ref 0.0–0.1)
Basophils Relative: 2 %
Eosinophils Absolute: 0.5 10*3/uL (ref 0.0–0.5)
Eosinophils Relative: 6 %
HCT: 46.2 % (ref 39.0–52.0)
Hemoglobin: 14.8 g/dL (ref 13.0–17.0)
Immature Granulocytes: 0 %
Lymphocytes Relative: 30 %
Lymphs Abs: 2.3 10*3/uL (ref 0.7–4.0)
MCH: 27.8 pg (ref 26.0–34.0)
MCHC: 32 g/dL (ref 30.0–36.0)
MCV: 86.8 fL (ref 80.0–100.0)
Monocytes Absolute: 1.1 10*3/uL — ABNORMAL HIGH (ref 0.1–1.0)
Monocytes Relative: 14 %
Neutro Abs: 3.8 10*3/uL (ref 1.7–7.7)
Neutrophils Relative %: 48 %
Platelet Count: 462 10*3/uL — ABNORMAL HIGH (ref 150–400)
RBC: 5.32 MIL/uL (ref 4.22–5.81)
RDW: 16.8 % — ABNORMAL HIGH (ref 11.5–15.5)
WBC Count: 7.9 10*3/uL (ref 4.0–10.5)
nRBC: 0 % (ref 0.0–0.2)

## 2022-09-27 LAB — CMP (CANCER CENTER ONLY)
ALT: 19 U/L (ref 0–44)
AST: 18 U/L (ref 15–41)
Albumin: 4.2 g/dL (ref 3.5–5.0)
Alkaline Phosphatase: 79 U/L (ref 38–126)
Anion gap: 9 (ref 5–15)
BUN: 17 mg/dL (ref 8–23)
CO2: 24 mmol/L (ref 22–32)
Calcium: 10.7 mg/dL — ABNORMAL HIGH (ref 8.9–10.3)
Chloride: 103 mmol/L (ref 98–111)
Creatinine: 1 mg/dL (ref 0.61–1.24)
GFR, Estimated: >60 mL/min (ref >60)
Glucose, Bld: 210 mg/dL — ABNORMAL HIGH (ref 70–99)
Potassium: 4.2 mmol/L (ref 3.5–5.1)
Sodium: 136 mmol/L (ref 135–145)
Total Bilirubin: 0.4 mg/dL (ref 0.3–1.2)
Total Protein: 7.5 g/dL (ref 6.5–8.1)

## 2022-09-27 MED ORDER — ENOXAPARIN SODIUM 150 MG/ML IJ SOSY
150.0000 mg | PREFILLED_SYRINGE | INTRAMUSCULAR | 12 refills | Status: DC
  Filled 2022-09-27 – 2022-10-30 (×2): qty 30, 30d supply, fill #0

## 2022-09-27 NOTE — Progress Notes (Signed)
Hematology and Oncology Follow Up Visit  Derek [name] [id number] 05/22/1945 77 y.o. 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  The problem right now is that he has colitis.  He developed abdominal pain.  He subsequently had a CT scan.  Initially, he thought that this was his pancreatic cancer, back.  He has been free of disease for the cancer for about 7 years.  He had a CT scan which seems to suggest colitis.  Surprisingly he has never had diarrhea.  There is no melena or hematochezia.  He was put on Flagyl.  He says the abdominal pain is getting a little bit better.  He wants to go off the Lovenox.  I told him this is not a good idea given that he has had multiple thromboembolic events.  He has factor V Leiden.  I think if he wants to go to daily Lovenox we can try this.  I would dose him at 150 mg a day.  His blood sugars seem to be managing okay.  He has had no cough.  He has had no increased shortness of breath.  He has had some discomfort in the right inguinal area where he has a swollen lymph node.  This could be from the colitis.  He does have a little bit of swelling in the right leg.  This is chronic also.  We will see if Interventional Radiology can see him again and see about a thrombectomy.  He really would like to have another procedure if possible.  Currently, I would have to say that his performance status prior ECOG 2.  Medications:  Current Outpatient Medications:    acetaminophen (TYLENOL) 500 MG tablet, Take 500 mg by mouth every 6 (six) hours as needed for mild pain or headache., Disp: , Rfl:    aspirin EC 81 MG tablet, Take 81 mg by mouth daily. Swallow whole., Disp: , Rfl:    carvedilol (COREG) 25 MG tablet, Take 1 tablet  (25 mg total) by mouth every evening., Disp: 90 tablet, Rfl: 4   diphenhydramine-acetaminophen (TYLENOL PM) 25-500 MG TABS tablet, Take 1 tablet by mouth at bedtime as needed., Disp: , Rfl:    docusate sodium (COLACE) 100 MG capsule, Take 100 mg by mouth 2 (two) times daily as needed., Disp: , Rfl:    Dulaglutide (TRULICITY) 4.5 MG/0.5ML SOPN, Inject 0.5 mL (4.5 mg total) under the skin every 7 days., Disp: 6 mL, Rfl: 1   empagliflozin (JARDIANCE) 25 MG TABS tablet, Take 1 tablet (25 mg total) by mouth daily., Disp: 90 tablet, Rfl: 1   enoxaparin (LOVENOX) 120 MG/0.8ML injection, Inject 0.8 mLs (120 mg total) into the skin every 12 (twelve) hours., Disp: 60 mL, Rfl: 0   fluticasone (FLONASE) 50 MCG/ACT nasal spray, Place 2 sprays into both nostrils daily. (Patient taking differently: Place 1-2 sprays into both nostrils daily as needed for allergies.), Disp: 16 g, Rfl: 5   furosemide (LASIX) 20 MG tablet, Take 20 mg by mouth daily as needed for edema., Disp: , Rfl:    Insulin Degludec FlexTouch 100 UNIT/ML SOPN, Inject 55 Units into the skin daily. (Patient taking differently: Inject 45 Units into the skin daily.), Disp: 60 mL, Rfl: 1  lansoprazole (PREVACID) 30 MG capsule, Take 1 capsule (30 mg total) by mouth daily before a meal, Disp: 90 capsule, Rfl: 1   levocetirizine (XYZAL) 5 MG tablet, Take 5 mg by mouth every evening., Disp: , Rfl:    lidocaine (LIDODERM) 5 %, Place 1 patch onto the skin daily. Remove & Discard patch within 12 hours or as directed by MD, Disp: 30 patch, Rfl: 0   losartan (COZAAR) 50 MG tablet, Take 1 tablet (50 mg total) by mouth daily., Disp: 90 tablet, Rfl: 4   magnesium oxide (MAG-OX) 400 MG tablet, Take 800 mg by mouth at bedtime., Disp: , Rfl:    metroNIDAZOLE (FLAGYL) 500 MG tablet, Take 1 tablet (500 mg total) by mouth 3 (three) times daily for 10 days., Disp: 30 tablet, Rfl: 0   nystatin (MYCOSTATIN) 100000 UNIT/ML suspension, Use 4-5 mL for Mouth/Throat as directed  four times a day (Patient taking differently: Take 5 mLs by mouth 4 (four) times daily as needed (thrush).), Disp: 240 mL, Rfl: 3   ondansetron (ZOFRAN-ODT) 4 MG disintegrating tablet, Take 1 tablet (4 mg total) by mouth every 8 (eight) hours as needed for nausea or vomiting., Disp: 30 tablet, Rfl: 0   pravastatin (PRAVACHOL) 20 MG tablet, Take 1 tablet (20 mg total) by mouth 2 (two) times a week., Disp: 40 tablet, Rfl: 4   pregabalin (LYRICA) 50 MG capsule, Take 1 capsule (50 mg total) by mouth every evening, 1 to 3 hours before bedtime., Disp: 90 capsule, Rfl: 1   triamcinolone cream (KENALOG) 0.1 %, Apply 1 application topically to affected area(s) 2 (two) times daily., Disp: 80 g, Rfl: 3  Allergies:  Allergies  Allergen Reactions   Ditropan [Oxybutynin] Swelling and Other (See Comments)   Quinine Other (See Comments)    Platelets dropped- consumptive coagulopathy     Vibra-Tab [Doxycycline] Other (See Comments)    Epistaxis     Voltaren [Diclofenac] Swelling and Other (See Comments)    Elevated liver enzymes Lip swelling    Zoloft [Sertraline] Other (See Comments)    Insomnia Acute urinary retention   Cymbalta [Duloxetine Hcl] Other (See Comments)    Night sweats   Morphine And Codeine Nausea And Vomiting, Swelling and Other (See Comments)    Facial and lip swelling OK to use IR form of hydrocodone and oxycodone   Oxycontin [Oxycodone Hcl] Swelling and Other (See Comments)    Lip swelling OK to use IR form   Tape Other (See Comments)    Skin tears easily  SKIN IS FRAGILE- CERTAIN BANDAGES TEAR OFF THE SKIN, "BANDAIDS ARE OKAY."   Voltaren [Diclofenac Sodium] Other (See Comments)    Elevated liver enzymes   Zohydro Er [Hydrocodone Bitartrate Er] Swelling    Facial swelling OK to use IR form   Fentanyl Nausea And Vomiting and Other (See Comments)    Nausea, vomiting ad headaches - Per patient "okay to use nothing ever happened with it"   Neurontin [Gabapentin] Other (See  Comments)    Unknown reaction     Past Medical History, Surgical history, Social history, and Family History were reviewed and updated.  Review of Systems: Review of Systems  Constitutional:  Positive for fatigue.  HENT:  Negative.    Eyes: Negative.   Respiratory: Negative.    Cardiovascular:  Positive for leg swelling.  Gastrointestinal: Negative.   Endocrine: Negative.   Genitourinary:  Positive for hematuria.   Musculoskeletal:  Positive for back pain, myalgias and neck pain.  Neurological:  Positive for dizziness and headaches.  Hematological: Negative.   Psychiatric/Behavioral: Negative.      Physical Exam:  height is 5\' 11"  (1.803 m) and weight is 260 lb (117.9 kg). His oral temperature is 98 F (36.7 C). His blood pressure is 137/78 and his pulse is 77. His respiration is 19 and oxygen saturation is 98%.   Wt Readings from Last 3 Encounters:  09/27/22 260 lb (117.9 kg)  07/24/22 261 lb 0.6 oz (118.4 kg)  07/06/22 146 lb (66.2 kg)    Physical Exam Vitals reviewed.  HENT:     Head: Normocephalic and atraumatic.  Eyes:     Pupils: Pupils are equal, round, and reactive to light.  Cardiovascular:     Rate and Rhythm: Normal rate and regular rhythm.     Heart sounds: Normal heart sounds.  Pulmonary:     Effort: Pulmonary effort is normal.     Breath sounds: Normal breath sounds.  Abdominal:     General: Bowel sounds are normal.     Palpations: Abdomen is soft.  Musculoskeletal:        General: No tenderness or deformity. Normal range of motion.     Cervical back: Normal range of motion.     Comments: His extremities does show some mild swelling in the right leg.  It is not as firm.  He has good warmth to the leg.  He has palpable pulses in the distal extremity.    Lymphadenopathy:     Cervical: No cervical adenopathy.  Skin:    General: Skin is warm and dry.     Findings: No erythema or rash.  Neurological:     Mental Status: He is alert and oriented to  person, place, and time.  Psychiatric:        Behavior: Behavior normal.        Thought Content: Thought content normal.        Judgment: Judgment normal.     Lab Results  Component Value Date   WBC 7.9 09/27/2022   HGB 14.8 09/27/2022   HCT 46.2 09/27/2022   MCV 86.8 09/27/2022   PLT 462 (H) 09/27/2022     Chemistry      Component Value Date/Time   NA 135 07/24/2022 1439   K 4.3 07/24/2022 1439   CL 106 07/24/2022 1439   CO2 23 07/24/2022 1439   BUN 19 07/24/2022 1439   CREATININE 1.20 09/21/2022 0918   CREATININE 1.14 07/24/2022 1439      Component Value Date/Time   CALCIUM 10.8 (H) 07/24/2022 1439   ALKPHOS 79 07/24/2022 1439   AST 17 07/24/2022 1439   ALT 21 07/24/2022 1439   BILITOT 0.4 07/24/2022 1439       Impression and Plan: Mr. Stefka is a very nice 77 year old white male.  He has had incredible history.  He has had a past history of a neuroendocrine pancreatic tumor.  He apparently was hospitalized for 4 months because of complications.  Again, he has multiple problems.  He has a colitis now.  He has a thromboembolic disease.  He has diabetes.  Again, we will try to get him on daily Lovenox at 150 mg.  This will probably start about 3-4 weeks.  He is willing to do this.  I think this is a good compromise for him since he really wants to be off the Lovenox.  Hopefully, this colitis will resolve with the Flagyl.  I will plan to see him back in 6  weeks.   Josph Macho, MD 8/7/202410:44 AM

## 2022-10-03 DIAGNOSIS — J320 Chronic maxillary sinusitis: Secondary | ICD-10-CM | POA: Diagnosis not present

## 2022-10-03 DIAGNOSIS — Z85819 Personal history of malignant neoplasm of unspecified site of lip, oral cavity, and pharynx: Secondary | ICD-10-CM | POA: Diagnosis not present

## 2022-10-03 DIAGNOSIS — J343 Hypertrophy of nasal turbinates: Secondary | ICD-10-CM | POA: Diagnosis not present

## 2022-10-09 DIAGNOSIS — Z86718 Personal history of other venous thrombosis and embolism: Secondary | ICD-10-CM | POA: Diagnosis not present

## 2022-10-09 DIAGNOSIS — Z09 Encounter for follow-up examination after completed treatment for conditions other than malignant neoplasm: Secondary | ICD-10-CM | POA: Diagnosis not present

## 2022-10-12 ENCOUNTER — Other Ambulatory Visit: Payer: Self-pay

## 2022-10-12 ENCOUNTER — Other Ambulatory Visit (HOSPITAL_BASED_OUTPATIENT_CLINIC_OR_DEPARTMENT_OTHER): Payer: Self-pay

## 2022-10-13 DIAGNOSIS — M5451 Vertebrogenic low back pain: Secondary | ICD-10-CM | POA: Diagnosis not present

## 2022-10-19 DIAGNOSIS — E113393 Type 2 diabetes mellitus with moderate nonproliferative diabetic retinopathy without macular edema, bilateral: Secondary | ICD-10-CM | POA: Diagnosis not present

## 2022-10-30 ENCOUNTER — Other Ambulatory Visit (HOSPITAL_BASED_OUTPATIENT_CLINIC_OR_DEPARTMENT_OTHER): Payer: Self-pay

## 2022-10-31 ENCOUNTER — Other Ambulatory Visit (HOSPITAL_BASED_OUTPATIENT_CLINIC_OR_DEPARTMENT_OTHER): Payer: Self-pay

## 2022-11-02 ENCOUNTER — Telehealth: Payer: Self-pay

## 2022-11-02 NOTE — Telephone Encounter (Signed)
Patient called and LM inquiring if he needed to have another scan on his leg, called and LM with patient informing him Dr.Ennever did not order a scan but did want to follow back up with him which does appear to be scheduled on 9/26 at 245. Left appointment information on patients VM and informed him too call back with any other questions or concerns.

## 2022-11-16 ENCOUNTER — Ambulatory Visit: Payer: HMO | Admitting: Hematology & Oncology

## 2022-11-16 ENCOUNTER — Other Ambulatory Visit: Payer: HMO

## 2022-11-19 DIAGNOSIS — E113393 Type 2 diabetes mellitus with moderate nonproliferative diabetic retinopathy without macular edema, bilateral: Secondary | ICD-10-CM | POA: Diagnosis not present

## 2022-11-21 ENCOUNTER — Other Ambulatory Visit (HOSPITAL_BASED_OUTPATIENT_CLINIC_OR_DEPARTMENT_OTHER): Payer: Self-pay

## 2022-11-21 DIAGNOSIS — H25043 Posterior subcapsular polar age-related cataract, bilateral: Secondary | ICD-10-CM | POA: Diagnosis not present

## 2022-11-21 DIAGNOSIS — H18413 Arcus senilis, bilateral: Secondary | ICD-10-CM | POA: Diagnosis not present

## 2022-11-21 DIAGNOSIS — H2512 Age-related nuclear cataract, left eye: Secondary | ICD-10-CM | POA: Diagnosis not present

## 2022-11-21 DIAGNOSIS — H35373 Puckering of macula, bilateral: Secondary | ICD-10-CM | POA: Diagnosis not present

## 2022-11-21 DIAGNOSIS — H2513 Age-related nuclear cataract, bilateral: Secondary | ICD-10-CM | POA: Diagnosis not present

## 2022-11-21 MED ORDER — PREDNISOLONE ACETATE 1 % OP SUSP
1.0000 [drp] | Freq: Four times a day (QID) | OPHTHALMIC | 1 refills | Status: DC
  Filled 2022-11-21: qty 5, 25d supply, fill #0

## 2022-11-21 MED ORDER — GATIFLOXACIN 0.5 % OP SOLN
1.0000 [drp] | Freq: Four times a day (QID) | OPHTHALMIC | 1 refills | Status: DC
  Filled 2022-11-21: qty 5, 25d supply, fill #0

## 2022-11-21 MED ORDER — KETOROLAC TROMETHAMINE 0.5 % OP SOLN
1.0000 [drp] | Freq: Four times a day (QID) | OPHTHALMIC | 1 refills | Status: DC
  Filled 2022-11-21: qty 10, 50d supply, fill #0

## 2022-11-22 ENCOUNTER — Other Ambulatory Visit (HOSPITAL_BASED_OUTPATIENT_CLINIC_OR_DEPARTMENT_OTHER): Payer: Self-pay

## 2022-11-23 ENCOUNTER — Other Ambulatory Visit (HOSPITAL_COMMUNITY): Payer: Self-pay

## 2022-11-30 ENCOUNTER — Encounter: Payer: Self-pay | Admitting: Interventional Radiology

## 2022-12-01 ENCOUNTER — Other Ambulatory Visit: Payer: Self-pay | Admitting: Interventional Radiology

## 2022-12-01 DIAGNOSIS — I82401 Acute embolism and thrombosis of unspecified deep veins of right lower extremity: Secondary | ICD-10-CM

## 2022-12-01 DIAGNOSIS — I82411 Acute embolism and thrombosis of right femoral vein: Secondary | ICD-10-CM

## 2022-12-04 ENCOUNTER — Encounter: Payer: Self-pay | Admitting: Hematology & Oncology

## 2022-12-04 ENCOUNTER — Other Ambulatory Visit (HOSPITAL_BASED_OUTPATIENT_CLINIC_OR_DEPARTMENT_OTHER): Payer: Self-pay

## 2022-12-04 ENCOUNTER — Inpatient Hospital Stay (HOSPITAL_BASED_OUTPATIENT_CLINIC_OR_DEPARTMENT_OTHER): Payer: PPO | Admitting: Hematology & Oncology

## 2022-12-04 ENCOUNTER — Inpatient Hospital Stay: Payer: PPO | Attending: Hematology & Oncology

## 2022-12-04 VITALS — BP 153/89 | HR 78 | Temp 97.9°F | Resp 20 | Wt 250.0 lb

## 2022-12-04 DIAGNOSIS — I82412 Acute embolism and thrombosis of left femoral vein: Secondary | ICD-10-CM

## 2022-12-04 DIAGNOSIS — D6851 Activated protein C resistance: Secondary | ICD-10-CM | POA: Insufficient documentation

## 2022-12-04 DIAGNOSIS — Z8507 Personal history of malignant neoplasm of pancreas: Secondary | ICD-10-CM | POA: Insufficient documentation

## 2022-12-04 DIAGNOSIS — D3A8 Other benign neuroendocrine tumors: Secondary | ICD-10-CM

## 2022-12-04 DIAGNOSIS — Z7902 Long term (current) use of antithrombotics/antiplatelets: Secondary | ICD-10-CM | POA: Diagnosis not present

## 2022-12-04 LAB — CMP (CANCER CENTER ONLY)
ALT: 16 U/L (ref 0–44)
AST: 16 U/L (ref 15–41)
Albumin: 4 g/dL (ref 3.5–5.0)
Alkaline Phosphatase: 92 U/L (ref 38–126)
Anion gap: 9 (ref 5–15)
BUN: 17 mg/dL (ref 8–23)
CO2: 24 mmol/L (ref 22–32)
Calcium: 10.8 mg/dL — ABNORMAL HIGH (ref 8.9–10.3)
Chloride: 104 mmol/L (ref 98–111)
Creatinine: 1.12 mg/dL (ref 0.61–1.24)
GFR, Estimated: >60 mL/min (ref >60)
Glucose, Bld: 211 mg/dL — ABNORMAL HIGH (ref 70–99)
Potassium: 4 mmol/L (ref 3.5–5.1)
Sodium: 137 mmol/L (ref 135–145)
Total Bilirubin: 0.3 mg/dL (ref 0.3–1.2)
Total Protein: 7.2 g/dL (ref 6.5–8.1)

## 2022-12-04 LAB — CBC WITH DIFFERENTIAL (CANCER CENTER ONLY)
Abs Immature Granulocytes: 0.02 10*3/uL (ref 0.00–0.07)
Basophils Absolute: 0.1 10*3/uL (ref 0.0–0.1)
Basophils Relative: 2 %
Eosinophils Absolute: 0.5 10*3/uL (ref 0.0–0.5)
Eosinophils Relative: 6 %
HCT: 46.5 % (ref 39.0–52.0)
Hemoglobin: 15.2 g/dL (ref 13.0–17.0)
Immature Granulocytes: 0 %
Lymphocytes Relative: 31 %
Lymphs Abs: 2.7 10*3/uL (ref 0.7–4.0)
MCH: 28.2 pg (ref 26.0–34.0)
MCHC: 32.7 g/dL (ref 30.0–36.0)
MCV: 86.3 fL (ref 80.0–100.0)
Monocytes Absolute: 1 10*3/uL (ref 0.1–1.0)
Monocytes Relative: 12 %
Neutro Abs: 4.2 10*3/uL (ref 1.7–7.7)
Neutrophils Relative %: 49 %
Platelet Count: 303 10*3/uL (ref 150–400)
RBC: 5.39 MIL/uL (ref 4.22–5.81)
RDW: 14.9 % (ref 11.5–15.5)
WBC Count: 8.5 10*3/uL (ref 4.0–10.5)
nRBC: 0 % (ref 0.0–0.2)

## 2022-12-04 LAB — LACTATE DEHYDROGENASE: LDH: 118 U/L (ref 98–192)

## 2022-12-04 MED ORDER — DABIGATRAN ETEXILATE MESYLATE 150 MG PO CAPS
150.0000 mg | ORAL_CAPSULE | Freq: Two times a day (BID) | ORAL | 6 refills | Status: DC
  Filled 2022-12-04: qty 60, 30d supply, fill #0
  Filled 2023-01-02: qty 60, 30d supply, fill #1
  Filled 2023-01-30: qty 60, 30d supply, fill #2
  Filled 2023-03-14: qty 60, 30d supply, fill #3

## 2022-12-05 NOTE — Progress Notes (Signed)
Hematology and Oncology Follow Up Visit  Derek [name] [id number] [birth year] 77 y.o. 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 Pradaxa 150 mg p.o. twice daily-start on 12/05/2022.     Interim History:  Derek [name] is back for follow-up.  Unfortunately, he, himself, stopped the Lovenox.  He was tolerated himself injections into the stomach.  He really wants to be on a pill.  I think 1 option would be Pradaxa.  I think this would be reasonable to try.  Is it is a direct thrombin inhibitor.  I would have her on full dose at 150 mg p.o. twice daily.  He has severe back issues.  I think is going on have a thermal ablation of his spinal nerves.  I am not sure what this is going to happen.  I told him that he could always stop the Pradaxa 2 or 3 days before he has a procedure.  Hopefully, this will help give him some relief from the chronic pain that he has.  He does not have any problems with cough or shortness of breath.  There is no issues with chest wall pain.  He did have a bout of colitis at the back in the summertime.  This seems to have resolved.  He has had no rashes.  He has had no obvious bleeding.  Derek [name] does have chronic leg swelling.  Overall, I would say that his performance status is probably ECOG 2.     Medications:  Current Outpatient Medications:    acetaminophen (TYLENOL) 500 MG tablet, Take 500 mg by mouth every 6 (six) hours as needed for mild pain or headache., Disp: , Rfl:    aspirin EC 81 MG tablet, Take 81 mg by mouth daily. Swallow whole., Disp: , Rfl:    carvedilol (COREG) 25 MG tablet, Take 1 tablet (25 mg total) by mouth every evening., Disp: 90 tablet, Rfl: 4   dabigatran (PRADAXA) 150 MG  CAPS capsule, Take 1 capsule (150 mg total) by mouth 2 (two) times daily., Disp: 60 capsule, Rfl: 6   diphenhydramine-acetaminophen (TYLENOL PM) 25-500 MG TABS tablet, Take 1 tablet by mouth at bedtime as needed., Disp: , Rfl:    docusate sodium (COLACE) 100 MG capsule, Take 100 mg by mouth 2 (two) times daily as needed., Disp: , Rfl:    Dulaglutide (TRULICITY) 4.5 MG/0.5ML SOPN, Inject 0.5 mL (4.5 mg total) under the skin every 7 days., Disp: 6 mL, Rfl: 1   empagliflozin (JARDIANCE) 25 MG TABS tablet, Take 1 tablet (25 mg total) by mouth daily., Disp: 90 tablet, Rfl: 1   fluticasone (FLONASE) 50 MCG/ACT nasal spray, Place 2 sprays into both nostrils daily. (Patient taking differently: Place 1-2 sprays into both nostrils daily as needed for allergies.), Disp: 16 g, Rfl: 5   furosemide (LASIX) 20 MG tablet, Take 20 mg by mouth daily as needed for edema., Disp: , Rfl:    gatifloxacin (ZYMAXID) 0.5 % SOLN, Place 1 drop into the left eye 4 (four) times daily as directed. Store upside down!, Disp: 5 mL, Rfl: 1   Insulin Degludec FlexTouch 100 UNIT/ML SOPN, Inject 55 Units into the skin daily. (Patient taking differently: Inject 45 Units into the skin  daily.), Disp: 60 mL, Rfl: 1   ketorolac (ACULAR) 0.5 % ophthalmic solution, Place 1 drop into the left eye 4 (four) times daily as directed., Disp: 10 mL, Rfl: 1   lansoprazole (PREVACID) 30 MG capsule, Take 1 capsule (30 mg total) by mouth daily before a meal, Disp: 90 capsule, Rfl: 1   levocetirizine (XYZAL) 5 MG tablet, Take 5 mg by mouth every evening., Disp: , Rfl:    lidocaine (LIDODERM) 5 %, Place 1 patch onto the skin daily. Remove & Discard patch within 12 hours or as directed by MD, Disp: 30 patch, Rfl: 0   losartan (COZAAR) 50 MG tablet, Take 1 tablet (50 mg total) by mouth daily., Disp: 90 tablet, Rfl: 4   magnesium oxide (MAG-OX) 400 MG tablet, Take 800 mg by mouth at bedtime., Disp: , Rfl:    metroNIDAZOLE (FLAGYL) 500 MG tablet, Take 1 tablet  (500 mg total) by mouth 3 (three) times daily for 10 days., Disp: 30 tablet, Rfl: 0   nystatin (MYCOSTATIN) 100000 UNIT/ML suspension, Use 4-5 mL for Mouth/Throat as directed four times a day (Patient taking differently: Take 5 mLs by mouth 4 (four) times daily as needed (thrush).), Disp: 240 mL, Rfl: 3   ondansetron (ZOFRAN-ODT) 4 MG disintegrating tablet, Take 1 tablet (4 mg total) by mouth every 8 (eight) hours as needed for nausea or vomiting., Disp: 30 tablet, Rfl: 0   pravastatin (PRAVACHOL) 20 MG tablet, Take 1 tablet (20 mg total) by mouth 2 (two) times a week., Disp: 40 tablet, Rfl: 4   prednisoLONE acetate (PRED FORTE) 1 % ophthalmic suspension, Place 1 drop into the left eye 4 (four) times daily as directed. Please shake well before each use!, Disp: 5 mL, Rfl: 1   pregabalin (LYRICA) 50 MG capsule, Take 1 capsule (50 mg total) by mouth every evening, 1 to 3 hours before bedtime., Disp: 90 capsule, Rfl: 1   triamcinolone cream (KENALOG) 0.1 %, Apply 1 application topically to affected area(s) 2 (two) times daily., Disp: 80 g, Rfl: 3  Allergies:  Allergies  Allergen Reactions   Ditropan [Oxybutynin] Swelling and Other (See Comments)   Quinine Other (See Comments)    Platelets dropped- consumptive coagulopathy     Vibra-Tab [Doxycycline] Other (See Comments)    Epistaxis     Voltaren [Diclofenac] Swelling and Other (See Comments)    Elevated liver enzymes Lip swelling    Zoloft [Sertraline] Other (See Comments)    Insomnia Acute urinary retention   Cymbalta [Duloxetine Hcl] Other (See Comments)    Night sweats   Morphine And Codeine Nausea And Vomiting, Swelling and Other (See Comments)    Facial and lip swelling OK to use IR form of hydrocodone and oxycodone   Oxycontin [Oxycodone Hcl] Swelling and Other (See Comments)    Lip swelling OK to use IR form   Tape Other (See Comments)    Skin tears easily  SKIN IS FRAGILE- CERTAIN BANDAGES TEAR OFF THE SKIN, "BANDAIDS ARE  OKAY."   Voltaren [Diclofenac Sodium] Other (See Comments)    Elevated liver enzymes   Zohydro Er [Hydrocodone Bitartrate Er] Swelling    Facial swelling OK to use IR form   Fentanyl Nausea And Vomiting and Other (See Comments)    Nausea, vomiting ad headaches - Per patient "okay to use nothing ever happened with it"   Neurontin [Gabapentin] Other (See Comments)    Unknown reaction     Past Medical History, Surgical history, Social history, and Family  History were reviewed and updated.  Review of Systems: Review of Systems  Constitutional:  Positive for fatigue.  HENT:  Negative.    Eyes: Negative.   Respiratory: Negative.    Cardiovascular:  Positive for leg swelling.  Gastrointestinal: Negative.   Endocrine: Negative.   Genitourinary:  Positive for hematuria.   Musculoskeletal:  Positive for back pain, myalgias and neck pain.  Neurological:  Positive for dizziness and headaches.  Hematological: Negative.   Psychiatric/Behavioral: Negative.      Physical Exam:  weight is 250 lb (113.4 kg). His oral temperature is 97.9 F (36.6 C). His blood pressure is 153/89 (abnormal) and his pulse is 78. His respiration is 20 and oxygen saturation is 99%.   Wt Readings from Last 3 Encounters:  12/04/22 250 lb (113.4 kg)  09/27/22 260 lb (117.9 kg)  07/24/22 261 lb 0.6 oz (118.4 kg)    Physical Exam Vitals reviewed.  HENT:     Head: Normocephalic and atraumatic.  Eyes:     Pupils: Pupils are equal, round, and reactive to light.  Cardiovascular:     Rate and Rhythm: Normal rate and regular rhythm.     Heart sounds: Normal heart sounds.  Pulmonary:     Effort: Pulmonary effort is normal.     Breath sounds: Normal breath sounds.  Abdominal:     General: Bowel sounds are normal.     Palpations: Abdomen is soft.  Musculoskeletal:        General: No tenderness or deformity. Normal range of motion.     Cervical back: Normal range of motion.     Comments: His extremities does  show some mild swelling in the right leg.  It is not as firm.  He has good warmth to the leg.  He has palpable pulses in the distal extremity.    Lymphadenopathy:     Cervical: No cervical adenopathy.  Skin:    General: Skin is warm and dry.     Findings: No erythema or rash.  Neurological:     Mental Status: He is alert and oriented to person, place, and time.  Psychiatric:        Behavior: Behavior normal.        Thought Content: Thought content normal.        Judgment: Judgment normal.      Lab Results  Component Value Date   WBC 8.5 12/04/2022   HGB 15.2 12/04/2022   HCT 46.5 12/04/2022   MCV 86.3 12/04/2022   PLT 303 12/04/2022     Chemistry      Component Value Date/Time   NA 137 12/04/2022 1240   K 4.0 12/04/2022 1240   CL 104 12/04/2022 1240   CO2 24 12/04/2022 1240   BUN 17 12/04/2022 1240   CREATININE 1.12 12/04/2022 1240      Component Value Date/Time   CALCIUM 10.8 (H) 12/04/2022 1240   ALKPHOS 92 12/04/2022 1240   AST 16 12/04/2022 1240   ALT 16 12/04/2022 1240   BILITOT 0.3 12/04/2022 1240       Impression and Plan: Derek [name] is a very nice 77 year old white male.  He has had incredible history.  He has had a past history of a neuroendocrine pancreatic tumor.  He apparently was hospitalized for 4 months because of complications.  He had been doing well on the Lovenox.  However, he does not want to do anymore Lovenox.  We will see about Pradaxa.  We will try 150 mg p.o.  twice daily.  He will let us know when he is going to have the spinal nerve thermal ablation.  Again hopefully this will help with his back.  We will plan to see him back in about a month or so.  Since we changed him over to Pradaxa, I want to make sure that we I will closely follow-up with him.  Derek Macho, MD 10/15/20242:38 PM

## 2022-12-12 ENCOUNTER — Other Ambulatory Visit: Payer: Self-pay

## 2022-12-14 NOTE — Progress Notes (Signed)
Reason for visit: Hx of extensive RLE DVT. Follow up.   Care Team:  Primary Care: Daisy Floro, MD Hematology: Josph Macho, MD    History of present illness:   Mr. Derek [name] Sr. is a 77 y.o. 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  Pt is closely followed by his Oncologist / Hematologist, Dr Myna Hidalgo and had initially been on Lovenox but could not tolerate the injections anymore. He is currently back on Pradaxa. He returns to clinic after having his follow up RLE venous doppler, and is joined by his adult daughter, Derek [name]. They report that he only has minimal swelling of his RIGHT leg. He endorses normal ambulation, and improved quality of life. He says that this is "the best" his leg has been in his recent memory. ROS was positive for chronic back pain, for which he is now pursuing ketamine infusion treatments for management.   Review of Systems: A 12-point ROS discussed, and pertinent positives are indicated in the HPI above.  All other systems are negative.   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" no pulmonary md, sees dr Daisy Floro   Factor V Leiden mutation (HCC)    "never had blood clots"   GERD (gastroesophageal reflux  disease)    Hiatal hernia    History of kidney stones    multiple kidney stones-not a problem at present   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, pT1 pNX) no further treatment   History of panic attacks    History 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 SURGERY 09-21-2016 healed now   Peripheral vascular disease (HCC)    right leg - decreased pulse    Pleural effusion    Wears partial dentures    LOWER   Welders' lung (HCC)    chronic cough    Past Surgical History:  Procedure Laterality Date   ANAL FISTULECTOMY N/A 04/01/2013   Procedure: EXAM UNDER  ANESTHESIA WITH ANAL FISSURectomy, sphincterotomy and internal hemorrhoidectomy;  Surgeon: Shelly Rubenstein, MD;  Location: [facility];  Service: General;  Laterality: N/A;   APPLICATION OF WOUND VAC N/A 05/16/2015   Procedure: APPLICATION OF WOUND VAC;  Surgeon: Darnell Level, MD;  Location: [facility];  Service: General;  Laterality: N/A;   APPLICATION OF WOUND VAC N/A 05/20/2015   Procedure: EXHANGE OF ABDOMINAL  WOUND VAC DRESSING;  Surgeon: Darnell Level, MD;  Location: [facility];  Service: General;  Laterality: N/A;   COLONOSCOPY     CYSTOSCOPY N/A 05/23/2021   Procedure: Bluford Kaufmann;  Surgeon: Marcine Matar, MD;  Location: [facility];  Service: Urology;  Laterality: N/A;   CYSTOSCOPY WITH INSERTION  OF UROLIFT N/A 10/12/2016   Procedure: CYSTOSCOPY WITH INSERTION OF UROLIFT;  Surgeon: Marcine Matar, MD;  Location[facility];  Service: Urology;  Laterality: N/A;   EUS N/A 12/31/2014   Procedure: UPPER ENDOSCOPIC ULTRASOUND (EUS) LINEAR;  Surgeon: Rachael Fee, MD;  Location: [facility];  Service: Endoscopy;  Laterality: N/A;   HARDWARE REMOVAL Left 2013   left ankle   HEMORRHOIDECTOMY WITH HEMORRHOID BANDING     INCISION AND DRAINAGE ABSCESS N/A 05/14/2015   Procedure: DRAINAGE OF INFECTED PANCREATIC PSEUDOCYST;  Surgeon: Darnell Level, MD;  Location: [facility];  Service: General;  Laterality: N/A;   INCISION AND DRAINAGE OF WOUND N/A 05/16/2015   Procedure: IRRIGATION AND DEBRIDEMENT WOUND;  Surgeon: Darnell Level, MD;  Location: [facility];  Service: General;  Laterality: N/A;   INCISIONAL HERNIA REPAIR N/A 11/12/2015   Procedure: REPAIR INCISIONAL HERNIA WITH MESH;  Surgeon: Darnell Level, MD;  Location: [facility] OR;  Service: General;  Laterality: N/A;   INGUINAL HERNIA REPAIR Right 1974;  1982   INSERTION OF MESH N/A 11/12/2015   Procedure: INSERTION OF MESH;  Surgeon: Darnell Level, MD;  Location: [facility] OR;  Service: General;  Laterality: N/A;   IR GENERIC HISTORICAL  04/20/2015   IR RADIOLOGIST EVAL & MGMT 04/20/2015 [facility] INTERV RAD   IR IVC FILTER PLMT / S&I Lenise Arena GUID/MOD SED  11/25/2020   IR PTA NON CORO-LOWER EXTREM  07/06/2022   IR RADIOLOGIST EVAL & MGMT  10/26/2021   IR RADIOLOGIST EVAL & MGMT  01/26/2022   IR RADIOLOGIST EVAL & MGMT  06/02/2022   IR RADIOLOGIST EVAL & MGMT  12/15/2022   IR RADIOLOGY PERIPHERAL GUIDED IV START  11/25/2020   IR THROMBECT VENO MECH MOD SED  10/08/2021   IR US GUIDE VASC ACCESS LEFT  04/24/2022   IR US GUIDE VASC ACCESS LEFT  07/06/2022   IR US GUIDE VASC ACCESS RIGHT  11/25/2020   IR US GUIDE VASC ACCESS RIGHT  10/08/2021   IR US GUIDE VASC ACCESS RIGHT  07/06/2022   IR VENO/EXT/UNI RIGHT  10/12/2021   IR VENO/EXT/UNI RIGHT  04/24/2022   IR VENO/EXT/UNI  RIGHT  07/06/2022   IR VENOCAVAGRAM IVC  04/24/2022   KNEE ARTHROSCOPY Bilateral right 08-08-2000;  left 11-23-2000   meniscal repair and chondroplasty's   KNEE ARTHROSCOPY  03/01/2012   Procedure: ARTHROSCOPY KNEE;  Surgeon: Loanne Drilling, MD;  Location: [facility];  Service: Orthopedics;  Laterality: Left;  WITH SYNOVECTOMY    LAPAROTOMY N/A 05/14/2015   Procedure: EXPLORATORY LAPAROTOMY;  Surgeon: Darnell Level, MD;  Location: [facility];  Service: General;  Laterality: N/A;   MASS EXCISION N/A 12/24/2019   Procedure: EXCISION LIP CARCINOMA;  Surgeon: Newman Pies, MD;  Location: [facility]  OR;  Service: ENT;  Laterality: N/A;   ORIF LEFT ANKLE FX  08/20/2009   distal tib-fib and malleolus   ROTATOR CUFF REPAIR Bilateral right 1994; left 1993, left 1993   TOTAL KNEE ARTHROPLASTY Right 06/02/2013   Procedure: RIGHT TOTAL KNEE ARTHROPLASTY;  Surgeon: Loanne Drilling, MD;  Location: [facility];  Service: Orthopedics;  Laterality: Right;   TOTAL KNEE ARTHROPLASTY Left 01-31-2008   dr Lequita Halt   TOTAL KNEE REVISION Right 01/23/2018   Procedure: right knee polyethylene revision;  Surgeon: Ollen Gross, MD;  Location: [facility];  Service: Orthopedics;  Laterality: Right;    TRANSURETHRAL RESECTION OF BLADDER TUMOR N/A 05/23/2021   Procedure: TRANSURETHRAL BIOPSY OF BLADDER;  Surgeon: Marcine Matar, MD;  Location: [facility];  Service: Urology;  Laterality: N/A;   TRANSURETHRAL RESECTION OF PROSTATE N/A 04/21/2019   Procedure: TRANSURETHRAL RESECTION OF THE PROSTATE (TURP);  Surgeon: Marcine Matar, MD;  Location: [facility];  Service: Urology;  Laterality: N/A;   UMBILICAL HERNIA REPAIR  08-11-1999   dr Magnus Ivan   UPPER GASTROINTESTINAL ENDOSCOPY  sept 2016   WOUND EXPLORATION N/A 09/21/2016   Procedure: WOUND EXPLORATION AND DEBRIDEMENT ABDOMINAL WALL;  Surgeon: Darnell Level, MD;  Location: [facility];  Service: General;  Laterality: N/A;    Allergies: Ditropan  [oxybutynin], Quinine, Vibra-tab [doxycycline], Voltaren [diclofenac], Zoloft [sertraline], Cymbalta [duloxetine hcl], Morphine and codeine, Oxycontin [oxycodone hcl], Tape, Voltaren [diclofenac sodium], Zohydro er [hydrocodone bitartrate er], Fentanyl, and Neurontin [gabapentin]  Medications: Prior to Admission medications   Medication Sig Start Date End Date Taking? Authorizing Provider  acetaminophen (TYLENOL) 500 MG tablet Take 500 mg by mouth every 6 (six) hours as needed for mild pain or headache.    [provider]  aspirin EC 81 MG tablet Take 81 mg by mouth daily. Swallow whole.    [provider]  carvedilol (COREG) 25 MG tablet Take 1 tablet (25 mg total) by mouth every evening. 07/20/22     dabigatran (PRADAXA) 150 MG CAPS capsule Take 1 capsule (150 mg total) by mouth 2 (two) times daily. 12/04/22   Josph Macho, MD  diphenhydramine-acetaminophen (TYLENOL PM) 25-500 MG TABS tablet Take 1 tablet by mouth at bedtime as needed.    [provider]  docusate sodium (COLACE) 100 MG capsule Take 100 mg by mouth 2 (two) times daily as needed.    [provider]  Dulaglutide (TRULICITY) 4.5 MG/0.5ML SOAJ Inject 0.5 mL (4.5 mg total) under the skin every 7 days. 07/10/22     empagliflozin (JARDIANCE) 25 MG TABS tablet Take 1 tablet (25 mg total) by mouth daily. 07/10/22     fluticasone (FLONASE) 50 MCG/ACT nasal spray Place 2 sprays into both nostrils daily. Patient taking differently: Place 1-2 sprays into both nostrils daily as needed for allergies. 02/21/22     furosemide (LASIX) 20 MG tablet Take 20 mg by mouth daily as needed for edema.    [provider]  gatifloxacin (ZYMAXID) 0.5 % SOLN Place 1 drop into the left eye 4 (four) times daily as directed. Store upside down! 11/21/22     Insulin Degludec FlexTouch 100 UNIT/ML SOPN Inject 55 Units into the skin daily. Patient taking differently: Inject 45 Units into the skin daily. 06/05/22      ketorolac (ACULAR) 0.5 % ophthalmic solution Place 1 drop into the left eye 4 (four) times daily as directed. 11/21/22     lansoprazole (PREVACID) 30 MG capsule Take 1 capsule (30  mg total) by mouth daily before a meal 09/25/22     levocetirizine (XYZAL) 5 MG tablet Take 5 mg by mouth every evening.    [provider]  lidocaine (LIDODERM) 5 % Place 1 patch onto the skin daily. Remove & Discard patch within 12 hours or as directed by MD 04/25/22   Lanae Boast, MD  losartan (COZAAR) 50 MG tablet Take 1 tablet (50 mg total) by mouth daily. 07/20/22     magnesium oxide (MAG-OX) 400 MG tablet Take 800 mg by mouth at bedtime.    [provider]  metroNIDAZOLE (FLAGYL) 500 MG tablet Take 1 tablet (500 mg total) by mouth 3 (three) times daily for 10 days. 09/26/22     nystatin (MYCOSTATIN) 100000 UNIT/ML suspension Use 4-5 mL for Mouth/Throat as directed four times a day Patient taking differently: Take 5 mLs by mouth 4 (four) times daily as needed (thrush). 07/08/21     ondansetron (ZOFRAN-ODT) 4 MG disintegrating tablet Take 1 tablet (4 mg total) by mouth every 8 (eight) hours as needed for nausea or vomiting. 10/10/21   Amin, Ankit C, MD  pravastatin (PRAVACHOL) 20 MG tablet Take 1 tablet (20 mg total) by mouth 2 (two) times a week. 07/20/22     prednisoLONE acetate (PRED FORTE) 1 % ophthalmic suspension Place 1 drop into the left eye 4 (four) times daily as directed. Please shake well before each use! 11/21/22     pregabalin (LYRICA) 50 MG capsule Take 1 capsule (50 mg total) by mouth every evening, 1 to 3 hours before bedtime. 07/20/22     triamcinolone cream (KENALOG) 0.1 % Apply 1 application topically to affected area(s) 2 (two) times daily. 07/20/22        Family History  Problem Relation Age of Onset   Bone cancer Father        Jaw   Heart disease Paternal Uncle    Diabetes Maternal Grandmother    Factor V Leiden deficiency Daughter    Colon cancer Neg Hx    Esophageal cancer Neg Hx     Stomach cancer Neg Hx    Rectal cancer Neg Hx    Migraines Neg Hx     Social History   Socioeconomic History   Marital status: Married    Spouse name: Jasmine December   Number of children: 2   Years of education: Not on file   Highest education level: Not on file  Occupational History   Occupation: retired   Tobacco Use   Smoking status: Former    Current packs/day: 0.00    Average packs/day: 4.0 packs/day for 17.0 years (68.0 ttl pk-yrs)    Types: Cigarettes    Start date: 02/20/1958    Quit date: 02/21/1975    Years since quitting: 47.8   Smokeless tobacco: Never  Vaping Use   Vaping status: Never Used  Substance and Sexual Activity   Alcohol use: No    Alcohol/week: 0.0 standard drinks of alcohol   Drug use: No   Sexual activity: Not Currently  Other Topics Concern   Not on file  Social History Narrative   Married, retired Therapist, art   1 son 1 daughter   2 caffeine free (will switch off once supply is finished with caffiene).       Social Determinants of Health   Financial Resource Strain: Not on file  Food Insecurity: No Food Insecurity (04/22/2022)   Hunger Vital Sign    Worried About Running Out of Food in  the Last Year: Never true    Ran Out of Food in the Last Year: Never true  Transportation Needs: No Transportation Needs (04/22/2022)   PRAPARE - Administrator, Civil Service (Medical): No    Lack of Transportation (Non-Medical): No  Physical Activity: Not on file  Stress: Not on file  Social Connections: Unknown (07/05/2021)   [facility]   Social Network    Social Network: Not on file     Vital Signs: BP 138/89   Pulse 65   Temp 97.9 F (36.6 C)   Resp 20   SpO2 96%   Physical Exam  General: WN, NAD  CV: RRR on monitor Pulm: normal work of breathing on RA Abd: S, ND, NT MSK: minimal RLE BTK edema. No venostasis ulcers Psych: Appropriate affect.  Imaging:  RLE Venous Doppler:  12/15/2022 IMPRESSION: 1. No evidence of acute superimposed or worsening DVT within the RIGHT lower extremity.  2. Chronic-appearing, partially-occlusive RIGHT femoropopliteal DVT.  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    US Venous Img Lower Unilateral Right (DVT)  Result Date: 12/15/2022 CLINICAL DATA:  f/u thrombectomy RLE w/DVT Briefly, 77 year old male with a history significant for factor V Leiden with recurrent symptomatic RIGHT lower extremity DVT. EXAM: RIGHT LOWER EXTREMITY VENOUS DOPPLER ULTRASOUND TECHNIQUE: Gray-scale sonography with compression, as well as color and duplex ultrasound, were performed to evaluate the deep venous system(s) from the level of the common femoral vein through the popliteal and proximal calf veins. COMPARISON:  CT AP, 09/21/2022. IR RIGHT lower extremity venogram, 07/06/2022. RIGHT lower extremity venous duplex, 06/02/2022. FINDINGS: Suboptimal evaluation, with poor acoustic penetration secondary to patient habitus. VENOUS Normal compressibility of the common femoral, as well as the visualized portions of profunda femoral vein and great saphenous vein. Heterogeneously-echogenic, noncompressible and partially-occlusive filling defect within the imaged portions of the peripheral femoral vein, popliteal vein and imaged portions of the calf veins. Limited views of the contralateral common femoral vein are unremarkable. OTHER No evidence of superficial thrombophlebitis or abnormal fluid collection. A prominent RIGHT groin lymph node is present, with transaxial dimension 1 cm. Limitations: Patient body habitus IMPRESSION: 1. No evidence of acute superimposed or worsening DVT within the RIGHT lower extremity. 2. Chronic-appearing, partially-occlusive RIGHT femoropopliteal DVT. Derek Banning, MD [facility] Radiology Electronically Signed   By:  Derek [name] M.D.   On: 12/15/2022 16:43    Labs:  CBC: Recent Labs    07/06/22 0730 07/24/22 1439 09/27/22 0947 12/04/22 1240  WBC 8.0 7.0 7.9 8.5  HGB 14.1 14.0 14.8 15.2  HCT 44.4 43.6 46.2 46.5  PLT 463* 447* 462* 303    COAGS: Recent Labs    04/21/22 1715 07/06/22 0730  INR 1.5* 1.0  APTT 55*  --     BMP: Recent Labs    07/06/22 0730 07/24/22 1439 09/21/22 0918 09/27/22 0947 12/04/22 1240  NA 136 135  --  136 137  K 3.7 4.3  --  4.2 4.0  CL 108 106  --  103 104  CO2 22 23  --  24 24  GLUCOSE 126* 262*  --  210* 211*  BUN 14 19  --  17 17  CALCIUM 10.7* 10.8*  --  10.7* 10.8*  CREATININE 0.91 1.14 1.20 1.00 1.12  GFRNONAA >60 >60  --  >60 >60    Oncology  Cancer Staging  No matching staging information was found for the patient.   Assessment and Plan:  77 y.o. 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 Pt is s/p catheter-directed mechanical thrombectomy on 10/08/21, RLE venogram with aborted intervention on 04/24/22 and most recently IVUS and PTA for chronic post thrombotic venous change on 07/06/22.   Unable to tolerate Lovenox long term, currently back to Pradaxa. Exam demostrating minimal RLE BTK edema. RLE venous doppler today (12/15/2022) without evidence of acute superimposed or worsening DVT   Vill[place] score for severity of post-thrombotic syndrome in adults; RIGHT - Total criteria point count: 2 (0 to 4 points: Not present)    *Pt is mildly (barely) symptomatic and with good QoL for PTS.  *No lifestyle limiting PTS. No VIR intervention recommended at this time.  *continue AC (Pradaxa) as recommended by Med / Heme Onc *continue graded compression, 30 - 40 mmHg, new Rx sent with Pt  for fitting at [facility]  [place]  *Return to VIR clinic PRN    Thank you for allowing me to participate in the care of this Patient.  Electronically Signed:  Roanna Banning, MD [facility] Radiology   Pager. [phone] Clinic. [phone]  I spent a total of  45 minutes  in face to face in clinical consultation, greater than 50% of which wass counseling/coordinating care for Mr CALVEN GILKES Sr's chronic right lower extremity DVT

## 2022-12-15 ENCOUNTER — Other Ambulatory Visit (HOSPITAL_BASED_OUTPATIENT_CLINIC_OR_DEPARTMENT_OTHER): Payer: Self-pay

## 2022-12-15 ENCOUNTER — Ambulatory Visit
Admission: RE | Admit: 2022-12-15 | Discharge: 2022-12-15 | Disposition: A | Discharge status: Home / Self Care | Payer: PPO | Source: Ambulatory Visit | Attending: Interventional Radiology

## 2022-12-15 ENCOUNTER — Ambulatory Visit
Admission: RE | Admit: 2022-12-15 | Discharge: 2022-12-15 | Disposition: A | Discharge status: Home / Self Care | Payer: HMO | Source: Ambulatory Visit | Attending: Physician Assistant | Admitting: Physician Assistant

## 2022-12-15 DIAGNOSIS — I82411 Acute embolism and thrombosis of right femoral vein: Secondary | ICD-10-CM

## 2022-12-15 DIAGNOSIS — I82401 Acute embolism and thrombosis of unspecified deep veins of right lower extremity: Secondary | ICD-10-CM

## 2022-12-15 HISTORY — PX: IR RADIOLOGIST EVAL & MGMT: IMG5224

## 2022-12-18 ENCOUNTER — Other Ambulatory Visit (HOSPITAL_BASED_OUTPATIENT_CLINIC_OR_DEPARTMENT_OTHER): Payer: Self-pay

## 2022-12-18 DIAGNOSIS — Z7985 Long-term (current) use of injectable non-insulin antidiabetic drugs: Secondary | ICD-10-CM | POA: Diagnosis not present

## 2022-12-18 DIAGNOSIS — Z7984 Long term (current) use of oral hypoglycemic drugs: Secondary | ICD-10-CM | POA: Diagnosis not present

## 2022-12-18 DIAGNOSIS — E782 Mixed hyperlipidemia: Secondary | ICD-10-CM | POA: Diagnosis not present

## 2022-12-18 DIAGNOSIS — Z794 Long term (current) use of insulin: Secondary | ICD-10-CM | POA: Diagnosis not present

## 2022-12-18 DIAGNOSIS — I10 Essential (primary) hypertension: Secondary | ICD-10-CM | POA: Diagnosis not present

## 2022-12-18 DIAGNOSIS — E113393 Type 2 diabetes mellitus with moderate nonproliferative diabetic retinopathy without macular edema, bilateral: Secondary | ICD-10-CM | POA: Diagnosis not present

## 2022-12-18 MED ORDER — PEN NEEDLES 31G X 5 MM MISC
1.0000 | Freq: Every day | 3 refills | Status: AC
  Filled 2022-12-18: qty 100, 90d supply, fill #0

## 2022-12-18 MED ORDER — JARDIANCE 25 MG PO TABS
25.0000 mg | ORAL_TABLET | Freq: Every day | ORAL | 1 refills | Status: DC
  Filled 2022-12-18 – 2023-02-27 (×2): qty 90, 90d supply, fill #0

## 2022-12-18 MED ORDER — INSULIN DEGLUDEC FLEXTOUCH 100 UNIT/ML ~~LOC~~ SOPN
70.0000 [IU] | PEN_INJECTOR | Freq: Every day | SUBCUTANEOUS | 1 refills | Status: DC
  Filled 2022-12-18: qty 60, 86d supply, fill #0

## 2022-12-18 MED ORDER — MOUNJARO 7.5 MG/0.5ML ~~LOC~~ SOAJ
7.5000 mg | SUBCUTANEOUS | 1 refills | Status: DC
Start: 2022-12-18 — End: 2023-05-25
  Filled 2022-12-18: qty 2, 28d supply, fill #0
  Filled 2023-01-12: qty 2, 28d supply, fill #1
  Filled 2023-02-11: qty 2, 28d supply, fill #2
  Filled 2023-03-16: qty 2, 28d supply, fill #3
  Filled 2023-04-10: qty 2, 28d supply, fill #4

## 2022-12-19 ENCOUNTER — Other Ambulatory Visit (HOSPITAL_BASED_OUTPATIENT_CLINIC_OR_DEPARTMENT_OTHER): Payer: Self-pay

## 2022-12-19 DIAGNOSIS — E113393 Type 2 diabetes mellitus with moderate nonproliferative diabetic retinopathy without macular edema, bilateral: Secondary | ICD-10-CM | POA: Diagnosis not present

## 2022-12-20 ENCOUNTER — Other Ambulatory Visit (HOSPITAL_BASED_OUTPATIENT_CLINIC_OR_DEPARTMENT_OTHER): Payer: Self-pay

## 2022-12-21 ENCOUNTER — Other Ambulatory Visit (HOSPITAL_BASED_OUTPATIENT_CLINIC_OR_DEPARTMENT_OTHER): Payer: Self-pay

## 2023-01-02 DIAGNOSIS — Z86718 Personal history of other venous thrombosis and embolism: Secondary | ICD-10-CM | POA: Diagnosis not present

## 2023-01-02 DIAGNOSIS — G8929 Other chronic pain: Secondary | ICD-10-CM | POA: Diagnosis not present

## 2023-01-08 ENCOUNTER — Inpatient Hospital Stay: Payer: PPO | Attending: Hematology & Oncology

## 2023-01-08 ENCOUNTER — Inpatient Hospital Stay: Payer: PPO | Admitting: Hematology & Oncology

## 2023-01-08 DIAGNOSIS — D6851 Activated protein C resistance: Secondary | ICD-10-CM | POA: Insufficient documentation

## 2023-01-08 DIAGNOSIS — Z7901 Long term (current) use of anticoagulants: Secondary | ICD-10-CM | POA: Insufficient documentation

## 2023-01-08 DIAGNOSIS — Z7902 Long term (current) use of antithrombotics/antiplatelets: Secondary | ICD-10-CM | POA: Insufficient documentation

## 2023-01-09 ENCOUNTER — Other Ambulatory Visit (HOSPITAL_BASED_OUTPATIENT_CLINIC_OR_DEPARTMENT_OTHER): Payer: Self-pay

## 2023-01-09 MED ORDER — FREESTYLE LIBRE 3 SENSOR MISC
1.0000 | 3 refills | Status: DC
  Filled 2023-01-09: qty 6, 84d supply, fill #0
  Filled 2023-04-02: qty 6, 84d supply, fill #1
  Filled 2023-06-24: qty 6, 84d supply, fill #2
  Filled 2023-09-17: qty 6, 84d supply, fill #3

## 2023-01-10 ENCOUNTER — Other Ambulatory Visit (HOSPITAL_BASED_OUTPATIENT_CLINIC_OR_DEPARTMENT_OTHER): Payer: Self-pay

## 2023-01-10 ENCOUNTER — Encounter: Payer: Self-pay | Admitting: Hematology & Oncology

## 2023-01-10 ENCOUNTER — Inpatient Hospital Stay: Payer: PPO | Admitting: Hematology & Oncology

## 2023-01-10 ENCOUNTER — Inpatient Hospital Stay: Payer: PPO

## 2023-01-10 VITALS — BP 118/73 | HR 65 | Temp 97.6°F | Resp 20 | Ht 71.0 in | Wt 259.1 lb

## 2023-01-10 DIAGNOSIS — Z7902 Long term (current) use of antithrombotics/antiplatelets: Secondary | ICD-10-CM | POA: Diagnosis not present

## 2023-01-10 DIAGNOSIS — Z7901 Long term (current) use of anticoagulants: Secondary | ICD-10-CM | POA: Diagnosis not present

## 2023-01-10 DIAGNOSIS — M5441 Lumbago with sciatica, right side: Secondary | ICD-10-CM | POA: Diagnosis not present

## 2023-01-10 DIAGNOSIS — G8929 Other chronic pain: Secondary | ICD-10-CM | POA: Diagnosis not present

## 2023-01-10 DIAGNOSIS — D6851 Activated protein C resistance: Secondary | ICD-10-CM | POA: Diagnosis not present

## 2023-01-10 DIAGNOSIS — I82412 Acute embolism and thrombosis of left femoral vein: Secondary | ICD-10-CM

## 2023-01-10 LAB — CBC WITH DIFFERENTIAL (CANCER CENTER ONLY)
Abs Immature Granulocytes: 0.02 10*3/uL (ref 0.00–0.07)
Basophils Absolute: 0.2 10*3/uL — ABNORMAL HIGH (ref 0.0–0.1)
Basophils Relative: 2 %
Eosinophils Absolute: 0.5 10*3/uL (ref 0.0–0.5)
Eosinophils Relative: 6 %
HCT: 43.7 % (ref 39.0–52.0)
Hemoglobin: 14.3 g/dL (ref 13.0–17.0)
Immature Granulocytes: 0 %
Lymphocytes Relative: 38 %
Lymphs Abs: 3.4 10*3/uL (ref 0.7–4.0)
MCH: 28.3 pg (ref 26.0–34.0)
MCHC: 32.7 g/dL (ref 30.0–36.0)
MCV: 86.5 fL (ref 80.0–100.0)
Monocytes Absolute: 1.2 10*3/uL — ABNORMAL HIGH (ref 0.1–1.0)
Monocytes Relative: 13 %
Neutro Abs: 3.8 10*3/uL (ref 1.7–7.7)
Neutrophils Relative %: 41 %
Platelet Count: 454 10*3/uL — ABNORMAL HIGH (ref 150–400)
RBC: 5.05 MIL/uL (ref 4.22–5.81)
RDW: 15.3 % (ref 11.5–15.5)
WBC Count: 8.9 10*3/uL (ref 4.0–10.5)
nRBC: 0 % (ref 0.0–0.2)

## 2023-01-10 LAB — CMP (CANCER CENTER ONLY)
ALT: 13 U/L (ref 0–44)
AST: 16 U/L (ref 15–41)
Albumin: 3.8 g/dL (ref 3.5–5.0)
Alkaline Phosphatase: 67 U/L (ref 38–126)
Anion gap: 7 (ref 5–15)
BUN: 20 mg/dL (ref 8–23)
CO2: 24 mmol/L (ref 22–32)
Calcium: 10.6 mg/dL — ABNORMAL HIGH (ref 8.9–10.3)
Chloride: 108 mmol/L (ref 98–111)
Creatinine: 1.02 mg/dL (ref 0.61–1.24)
GFR, Estimated: >60 mL/min (ref >60)
Glucose, Bld: 177 mg/dL — ABNORMAL HIGH (ref 70–99)
Potassium: 4.1 mmol/L (ref 3.5–5.1)
Sodium: 139 mmol/L (ref 135–145)
Total Bilirubin: 0.4 mg/dL (ref <1.2)
Total Protein: 6.8 g/dL (ref 6.5–8.1)

## 2023-01-10 NOTE — Progress Notes (Signed)
: Hematology and Oncology Follow Up Visit  Derek [name] [id number] 14-Feb-1946 77 y.o. 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 Pradaxa 150 mg p.o. twice daily-start on 12/05/2022.     Interim History:  Derek [name] is back for follow-up.  He now is on Pradaxa.  He seems to be doing pretty well on the Pradaxa.  We started him on this about a month ago.  He still is having severe back pain.  Hopefully, he will be having the ablation of the spinal nerves sometime in January.  He has had no bleeding.  He has had no cough.  His appetite is doing fairly well.  He has had no nausea or vomiting.  He has had no rashes.  He does have the chronic leg swelling in the right leg.  Again, his main clinical problem is his for back.  Hopefully, the spinal nerve ablation will make a difference for him.  Overall, I would have to say that his performance status is probably ECOG 2.    Medications:  Current Outpatient Medications:    acetaminophen (TYLENOL) 500 MG tablet, Take 500 mg by mouth every 6 (six) hours as needed for mild pain or headache., Disp: , Rfl:    carvedilol (COREG) 25 MG tablet, Take 1 tablet (25 mg total) by mouth every evening., Disp: 90 tablet, Rfl: 4   Continuous Glucose Sensor (FREESTYLE LIBRE 3 SENSOR) MISC, Use as directed and change every 14 (fourteen) days., Disp: 6 each, Rfl: 3   dabigatran (PRADAXA) 150 MG CAPS capsule, Take 1 capsule (150 mg total) by mouth 2 (two) times daily., Disp: 60 capsule, Rfl: 6   docusate sodium (COLACE) 100 MG capsule, Take 100 mg by mouth 2 (two) times daily as needed., Disp: , Rfl:    empagliflozin (JARDIANCE) 25 MG TABS tablet, Take 1 tablet (25 mg total) by mouth daily.,  Disp: 90 tablet, Rfl: 1   fluticasone (FLONASE) 50 MCG/ACT nasal spray, Place 2 sprays into both nostrils daily. (Patient taking differently: Place 1-2 sprays into both nostrils daily as needed for allergies.), Disp: 16 g, Rfl: 5   furosemide (LASIX) 20 MG tablet, Take 20 mg by mouth daily as needed for edema., Disp: , Rfl:    Insulin Degludec FlexTouch 100 UNIT/ML SOPN, Inject 70 Units into the skin daily., Disp: 60 mL, Rfl: 1   Insulin Pen Needle (PEN NEEDLES) 31G X 5 MM MISC, Use 1 as directed once daily., Disp: 100 each, Rfl: 3   lansoprazole (PREVACID) 30 MG capsule, Take 1 capsule (30 mg total) by mouth daily before a meal, Disp: 90 capsule, Rfl: 1   levocetirizine (XYZAL) 5 MG tablet, Take 5 mg by mouth every evening., Disp: , Rfl:    losartan (COZAAR) 50 MG tablet, Take 1 tablet (50 mg total) by mouth daily., Disp: 90 tablet, Rfl: 4   nystatin (MYCOSTATIN) 100000 UNIT/ML suspension, Use 4-5 mL for Mouth/Throat as directed four times a day (Patient taking differently: Take 5 mLs by mouth 4 (four) times daily as needed (thrush).), Disp: 240 mL, Rfl: 3   ondansetron (ZOFRAN-ODT) 4 MG disintegrating tablet, Take 1 tablet (4 mg total) by mouth every 8 (eight) hours as needed for  nausea or vomiting., Disp: 30 tablet, Rfl: 0   pravastatin (PRAVACHOL) 20 MG tablet, Take 1 tablet (20 mg total) by mouth 2 (two) times a week., Disp: 40 tablet, Rfl: 4   pregabalin (LYRICA) 50 MG capsule, Take 1 capsule (50 mg total) by mouth every evening, 1 to 3 hours before bedtime., Disp: 90 capsule, Rfl: 1   tirzepatide (MOUNJARO) 7.5 MG/0.5ML Pen, Inject 7.5 mg into the skin once a week., Disp: 6 mL, Rfl: 1   triamcinolone cream (KENALOG) 0.1 %, Apply 1 application topically to affected area(s) 2 (two) times daily., Disp: 80 g, Rfl: 3   gatifloxacin (ZYMAXID) 0.5 % SOLN, Place 1 drop into the left eye 4 (four) times daily as directed. Store upside down! (Patient not taking: Reported on 01/10/2023), Disp: 5 mL, Rfl:  1   Insulin Degludec FlexTouch 100 UNIT/ML SOPN, Inject 55 Units into the skin daily. (Patient not taking: Reported on 01/10/2023), Disp: 60 mL, Rfl: 1   ketorolac (ACULAR) 0.5 % ophthalmic solution, Place 1 drop into the left eye 4 (four) times daily as directed. (Patient not taking: Reported on 01/10/2023), Disp: 10 mL, Rfl: 1   prednisoLONE acetate (PRED FORTE) 1 % ophthalmic suspension, Place 1 drop into the left eye 4 (four) times daily as directed. Please shake well before each use! (Patient not taking: Reported on 01/10/2023), Disp: 5 mL, Rfl: 1  Allergies:  Allergies  Allergen Reactions   Ditropan [Oxybutynin] Swelling and Other (See Comments)   Quinine Other (See Comments)    Platelets dropped- consumptive coagulopathy     Vibra-Tab [Doxycycline] Other (See Comments)    Epistaxis     Voltaren [Diclofenac] Swelling and Other (See Comments)    Elevated liver enzymes Lip swelling    Zoloft [Sertraline] Other (See Comments)    Insomnia Acute urinary retention   Cymbalta [Duloxetine Hcl] Other (See Comments)    Night sweats   Morphine And Codeine Nausea And Vomiting, Swelling and Other (See Comments)    Facial and lip swelling OK to use IR form of hydrocodone and oxycodone   Oxycontin [Oxycodone Hcl] Swelling and Other (See Comments)    Lip swelling OK to use IR form   Tape Other (See Comments)    Skin tears easily  SKIN IS FRAGILE- CERTAIN BANDAGES TEAR OFF THE SKIN, "BANDAIDS ARE OKAY."   Voltaren [Diclofenac Sodium] Other (See Comments)    Elevated liver enzymes   Zohydro Er [Hydrocodone Bitartrate Er] Swelling    Facial swelling OK to use IR form   Fentanyl Nausea And Vomiting and Other (See Comments)    Nausea, vomiting ad headaches - Per patient "okay to use nothing ever happened with it"   Neurontin [Gabapentin] Other (See Comments)    Unknown reaction     Past Medical History, Surgical history, Social history, and Family History were reviewed and  updated.  Review of Systems: Review of Systems  Constitutional:  Positive for fatigue.  HENT:  Negative.    Eyes: Negative.   Respiratory: Negative.    Cardiovascular:  Positive for leg swelling.  Gastrointestinal: Negative.   Endocrine: Negative.   Genitourinary:  Positive for hematuria.   Musculoskeletal:  Positive for back pain, myalgias and neck pain.  Neurological:  Positive for dizziness and headaches.  Hematological: Negative.   Psychiatric/Behavioral: Negative.      Physical Exam:  height is 5\' 11"  (1.803 m) and weight is 259 lb 1.9 oz (117.5 kg). His oral temperature is 97.6 F (36.4 C). His  blood pressure is 118/73 and his pulse is 65. His respiration is 20 and oxygen saturation is 97%.   Wt Readings from Last 3 Encounters:  01/10/23 259 lb 1.9 oz (117.5 kg)  12/04/22 250 lb (113.4 kg)  09/27/22 260 lb (117.9 kg)    Physical Exam Vitals reviewed.  HENT:     Head: Normocephalic and atraumatic.  Eyes:     Pupils: Pupils are equal, round, and reactive to light.  Cardiovascular:     Rate and Rhythm: Normal rate and regular rhythm.     Heart sounds: Normal heart sounds.  Pulmonary:     Effort: Pulmonary effort is normal.     Breath sounds: Normal breath sounds.  Abdominal:     General: Bowel sounds are normal.     Palpations: Abdomen is soft.  Musculoskeletal:        General: No tenderness or deformity. Normal range of motion.     Cervical back: Normal range of motion.     Comments: His extremities does show some mild swelling in the right leg.  It is not as firm.  He has good warmth to the leg.  He has palpable pulses in the distal extremity.    Lymphadenopathy:     Cervical: No cervical adenopathy.  Skin:    General: Skin is warm and dry.     Findings: No erythema or rash.  Neurological:     Mental Status: He is alert and oriented to person, place, and time.  Psychiatric:        Behavior: Behavior normal.        Thought Content: Thought content normal.         Judgment: Judgment normal.      Lab Results  Component Value Date   WBC 8.9 01/10/2023   HGB 14.3 01/10/2023   HCT 43.7 01/10/2023   MCV 86.5 01/10/2023   PLT 454 (H) 01/10/2023     Chemistry      Component Value Date/Time   NA 139 01/10/2023 1231   K 4.1 01/10/2023 1231   CL 108 01/10/2023 1231   CO2 24 01/10/2023 1231   BUN 20 01/10/2023 1231   CREATININE 1.02 01/10/2023 1231      Component Value Date/Time   CALCIUM 10.6 (H) 01/10/2023 1231   ALKPHOS 67 01/10/2023 1231   AST 16 01/10/2023 1231   ALT 13 01/10/2023 1231   BILITOT 0.4 01/10/2023 1231       Impression and Plan: Mr. Schoeff is a very nice 77 year old white male.  He has had incredible history.  He has had a past history of a neuroendocrine pancreatic tumor.  He apparently was hospitalized for 4 months because of complications.  Hopefully, the Pradaxa will hold him over.  Again, this is good.  Tenuous.  However, he really does not want to be on injections.  I will plan to see him back in 2 months now.  Maybe, by then, he would have had his first of several spinal nerve ablation procedures.    Josph Macho, MD 11/20/20241:15 PM

## 2023-01-21 ENCOUNTER — Other Ambulatory Visit (HOSPITAL_BASED_OUTPATIENT_CLINIC_OR_DEPARTMENT_OTHER): Payer: Self-pay

## 2023-01-24 ENCOUNTER — Other Ambulatory Visit: Payer: Self-pay

## 2023-01-24 ENCOUNTER — Other Ambulatory Visit (HOSPITAL_BASED_OUTPATIENT_CLINIC_OR_DEPARTMENT_OTHER): Payer: Self-pay

## 2023-01-24 DIAGNOSIS — Z6836 Body mass index (BMI) 36.0-36.9, adult: Secondary | ICD-10-CM | POA: Diagnosis not present

## 2023-01-24 DIAGNOSIS — E1165 Type 2 diabetes mellitus with hyperglycemia: Secondary | ICD-10-CM | POA: Diagnosis not present

## 2023-01-24 DIAGNOSIS — M1990 Unspecified osteoarthritis, unspecified site: Secondary | ICD-10-CM | POA: Diagnosis not present

## 2023-01-24 DIAGNOSIS — G479 Sleep disorder, unspecified: Secondary | ICD-10-CM | POA: Diagnosis not present

## 2023-01-24 DIAGNOSIS — G8929 Other chronic pain: Secondary | ICD-10-CM | POA: Diagnosis not present

## 2023-01-24 DIAGNOSIS — Z23 Encounter for immunization: Secondary | ICD-10-CM | POA: Diagnosis not present

## 2023-01-24 MED ORDER — RAMELTEON 8 MG PO TABS
8.0000 mg | ORAL_TABLET | Freq: Every evening | ORAL | 0 refills | Status: AC | PRN
  Filled 2023-01-24: qty 30, 30d supply, fill #0

## 2023-01-25 ENCOUNTER — Other Ambulatory Visit (HOSPITAL_BASED_OUTPATIENT_CLINIC_OR_DEPARTMENT_OTHER): Payer: Self-pay

## 2023-01-25 MED ORDER — TRAMADOL HCL 50 MG PO TABS
50.0000 mg | ORAL_TABLET | Freq: Two times a day (BID) | ORAL | 0 refills | Status: DC | PRN
  Filled 2023-01-25: qty 60, 30d supply, fill #0

## 2023-01-29 DIAGNOSIS — H2512 Age-related nuclear cataract, left eye: Secondary | ICD-10-CM | POA: Diagnosis not present

## 2023-01-30 ENCOUNTER — Other Ambulatory Visit (HOSPITAL_BASED_OUTPATIENT_CLINIC_OR_DEPARTMENT_OTHER): Payer: Self-pay

## 2023-01-30 DIAGNOSIS — H2511 Age-related nuclear cataract, right eye: Secondary | ICD-10-CM | POA: Diagnosis not present

## 2023-01-30 MED ORDER — KETOROLAC TROMETHAMINE 0.5 % OP SOLN
1.0000 [drp] | Freq: Four times a day (QID) | OPHTHALMIC | 1 refills | Status: DC
  Filled 2023-01-30: qty 5, 25d supply, fill #0

## 2023-01-30 MED ORDER — PREDNISOLONE ACETATE 1 % OP SUSP
1.0000 [drp] | Freq: Four times a day (QID) | OPHTHALMIC | 1 refills | Status: DC
  Filled 2023-01-30: qty 5, 25d supply, fill #0

## 2023-01-30 MED ORDER — GATIFLOXACIN 0.5 % OP SOLN
1.0000 [drp] | Freq: Four times a day (QID) | OPHTHALMIC | 1 refills | Status: DC
  Filled 2023-01-30: qty 5, 25d supply, fill #0

## 2023-02-06 DIAGNOSIS — H2512 Age-related nuclear cataract, left eye: Secondary | ICD-10-CM | POA: Diagnosis not present

## 2023-02-13 ENCOUNTER — Telehealth: Payer: Self-pay | Admitting: Hematology & Oncology

## 2023-02-13 ENCOUNTER — Other Ambulatory Visit: Payer: Self-pay

## 2023-02-13 DIAGNOSIS — I82412 Acute embolism and thrombosis of left femoral vein: Secondary | ICD-10-CM

## 2023-02-13 NOTE — Progress Notes (Signed)
Patients wife called asking for referral to vein specialist for patient. Referred to Dr.Mark [facility] per Dr.Ennever.

## 2023-02-13 NOTE — Telephone Encounter (Signed)
Completed external referral to Dr Consuela Mimes at [facility]. Referral fax transmission successful 02/13/23 @ 10:48AM.

## 2023-02-19 DIAGNOSIS — H2511 Age-related nuclear cataract, right eye: Secondary | ICD-10-CM | POA: Diagnosis not present

## 2023-02-20 ENCOUNTER — Other Ambulatory Visit: Payer: Self-pay | Admitting: Hematology & Oncology

## 2023-02-20 ENCOUNTER — Other Ambulatory Visit: Payer: Self-pay | Admitting: *Deleted

## 2023-02-20 DIAGNOSIS — M79604 Pain in right leg: Secondary | ICD-10-CM | POA: Diagnosis not present

## 2023-02-20 DIAGNOSIS — I82511 Chronic embolism and thrombosis of right femoral vein: Secondary | ICD-10-CM | POA: Diagnosis not present

## 2023-02-20 DIAGNOSIS — D3A8 Other benign neuroendocrine tumors: Secondary | ICD-10-CM

## 2023-02-27 ENCOUNTER — Other Ambulatory Visit (HOSPITAL_BASED_OUTPATIENT_CLINIC_OR_DEPARTMENT_OTHER): Payer: Self-pay

## 2023-02-27 ENCOUNTER — Other Ambulatory Visit: Payer: Self-pay

## 2023-02-27 DIAGNOSIS — D3A8 Other benign neuroendocrine tumors: Secondary | ICD-10-CM

## 2023-02-27 MED ORDER — ENOXAPARIN SODIUM 120 MG/0.8ML IJ SOSY
120.0000 mg | PREFILLED_SYRINGE | Freq: Two times a day (BID) | INTRAMUSCULAR | 0 refills | Status: DC
  Filled 2023-02-27: qty 8, 5d supply, fill #0

## 2023-02-27 NOTE — Progress Notes (Signed)
 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 Verbal order from Dr.Ennever for DG eye Foreign Body prior to MRI tomorrow. Patients wife informed.  Patients wife also informed we got a clearance for patients procedure with Dr. Tobie and was given medication orders as follows:  Stop Pradaxa  today 02/27/23, start Lovenox  BID tomorrow 02/28/2023 for 5 days (stopping on 03/04/23) take nothing on 03/05/23 and restart Pradxa on 03/06/23. Prescription for lovenox  sent to pharmacy. Patients wife verbalized all understanding and denies any questions or concerns at this time.

## 2023-02-28 ENCOUNTER — Ambulatory Visit (HOSPITAL_BASED_OUTPATIENT_CLINIC_OR_DEPARTMENT_OTHER)
Admission: RE | Admit: 2023-02-28 | Discharge: 2023-02-28 | Disposition: A | Discharge status: Home / Self Care | Payer: PPO | Source: Ambulatory Visit | Attending: Hematology & Oncology | Admitting: Hematology & Oncology

## 2023-02-28 DIAGNOSIS — D3A8 Other benign neuroendocrine tumors: Secondary | ICD-10-CM | POA: Insufficient documentation

## 2023-02-28 MED ORDER — GADOBUTROL 1 MMOL/ML IV SOLN: 10.0000 mL | Freq: Once | INTRAVENOUS | Status: DC | PRN

## 2023-02-28 MED ORDER — GADOBUTROL 1 MMOL/ML IV SOLN
10.0000 mL | Freq: Once | INTRAVENOUS | Status: AC | PRN
  Administered 2023-02-28: 10 mL via INTRAVENOUS

## 2023-03-05 ENCOUNTER — Other Ambulatory Visit (HOSPITAL_BASED_OUTPATIENT_CLINIC_OR_DEPARTMENT_OTHER): Payer: Self-pay

## 2023-03-05 DIAGNOSIS — E119 Type 2 diabetes mellitus without complications: Secondary | ICD-10-CM | POA: Diagnosis not present

## 2023-03-05 DIAGNOSIS — M5451 Vertebrogenic low back pain: Secondary | ICD-10-CM | POA: Diagnosis not present

## 2023-03-05 MED ORDER — METHOCARBAMOL 500 MG PO TABS
500.0000 mg | ORAL_TABLET | Freq: Four times a day (QID) | ORAL | 0 refills | Status: AC
  Filled 2023-03-05: qty 40, 10d supply, fill #0

## 2023-03-08 ENCOUNTER — Encounter: Payer: Self-pay | Admitting: *Deleted

## 2023-03-08 ENCOUNTER — Other Ambulatory Visit (HOSPITAL_BASED_OUTPATIENT_CLINIC_OR_DEPARTMENT_OTHER): Payer: Self-pay

## 2023-03-08 MED ORDER — TRAMADOL HCL 50 MG PO TABS
50.0000 mg | ORAL_TABLET | Freq: Two times a day (BID) | ORAL | 0 refills | Status: DC | PRN
  Filled 2023-03-08: qty 60, 30d supply, fill #0

## 2023-03-12 ENCOUNTER — Encounter: Payer: Self-pay | Admitting: Hematology & Oncology

## 2023-03-12 ENCOUNTER — Inpatient Hospital Stay: Payer: PPO | Attending: Hematology & Oncology

## 2023-03-12 ENCOUNTER — Inpatient Hospital Stay: Payer: PPO | Admitting: Hematology & Oncology

## 2023-03-12 ENCOUNTER — Other Ambulatory Visit: Payer: Self-pay

## 2023-03-12 ENCOUNTER — Other Ambulatory Visit (HOSPITAL_BASED_OUTPATIENT_CLINIC_OR_DEPARTMENT_OTHER): Payer: Self-pay

## 2023-03-12 VITALS — BP 114/48 | HR 60 | Temp 98.0°F | Resp 16 | Ht 71.0 in | Wt 254.0 lb

## 2023-03-12 DIAGNOSIS — Z7902 Long term (current) use of antithrombotics/antiplatelets: Secondary | ICD-10-CM | POA: Diagnosis not present

## 2023-03-12 DIAGNOSIS — I82401 Acute embolism and thrombosis of unspecified deep veins of right lower extremity: Secondary | ICD-10-CM | POA: Diagnosis not present

## 2023-03-12 DIAGNOSIS — D6851 Activated protein C resistance: Secondary | ICD-10-CM | POA: Insufficient documentation

## 2023-03-12 DIAGNOSIS — G8929 Other chronic pain: Secondary | ICD-10-CM

## 2023-03-12 LAB — CMP (CANCER CENTER ONLY)
ALT: 16 U/L (ref 0–44)
AST: 15 U/L (ref 15–41)
Albumin: 4 g/dL (ref 3.5–5.0)
Alkaline Phosphatase: 89 U/L (ref 38–126)
Anion gap: 8 (ref 5–15)
BUN: 22 mg/dL (ref 8–23)
CO2: 23 mmol/L (ref 22–32)
Calcium: 10.4 mg/dL — ABNORMAL HIGH (ref 8.9–10.3)
Chloride: 104 mmol/L (ref 98–111)
Creatinine: 1.03 mg/dL (ref 0.61–1.24)
GFR, Estimated: >60 mL/min (ref >60)
Glucose, Bld: 144 mg/dL — ABNORMAL HIGH (ref 70–99)
Potassium: 4.5 mmol/L (ref 3.5–5.1)
Sodium: 135 mmol/L (ref 135–145)
Total Bilirubin: 0.6 mg/dL (ref 0.0–1.2)
Total Protein: 7.1 g/dL (ref 6.5–8.1)

## 2023-03-12 LAB — CBC WITH DIFFERENTIAL (CANCER CENTER ONLY)
Abs Immature Granulocytes: 0.04 10*3/uL (ref 0.00–0.07)
Basophils Absolute: 0.1 10*3/uL (ref 0.0–0.1)
Basophils Relative: 1 %
Eosinophils Absolute: 0.4 10*3/uL (ref 0.0–0.5)
Eosinophils Relative: 4 %
HCT: 43.1 % (ref 39.0–52.0)
Hemoglobin: 14.3 g/dL (ref 13.0–17.0)
Immature Granulocytes: 0 %
Lymphocytes Relative: 23 %
Lymphs Abs: 2.3 10*3/uL (ref 0.7–4.0)
MCH: 28.6 pg (ref 26.0–34.0)
MCHC: 33.2 g/dL (ref 30.0–36.0)
MCV: 86.2 fL (ref 80.0–100.0)
Monocytes Absolute: 1.2 10*3/uL — ABNORMAL HIGH (ref 0.1–1.0)
Monocytes Relative: 12 %
Neutro Abs: 6 10*3/uL (ref 1.7–7.7)
Neutrophils Relative %: 60 %
Platelet Count: 446 10*3/uL — ABNORMAL HIGH (ref 150–400)
RBC: 5 MIL/uL (ref 4.22–5.81)
RDW: 15.9 % — ABNORMAL HIGH (ref 11.5–15.5)
WBC Count: 10 10*3/uL (ref 4.0–10.5)
nRBC: 0 % (ref 0.0–0.2)

## 2023-03-12 MED ORDER — AMOXICILLIN-POT CLAVULANATE 875-125 MG PO TABS
1.0000 | ORAL_TABLET | Freq: Two times a day (BID) | ORAL | 0 refills | Status: DC
  Filled 2023-03-12: qty 20, 10d supply, fill #0

## 2023-03-12 NOTE — Progress Notes (Signed)
: Hematology and Oncology Follow Up Visit  Derek [name] [id number] Jun 28, 1945 78 y.o. 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 Pradaxa 150 mg p.o. twice daily-start on 12/05/2022.     Interim History:  Derek [name] is back for follow-up.  Unfortunately, he does have a whole lot of problems with his right leg.  He has had problems with blood clots in that leg.  However, he is having some swelling and pain.  We actually an MRI of the right lower leg.  This was done on 02/28/2023.  This showed edema.  There may have been a little bit of cellulitis.  There is no mass.  Derek [name] also now is complaining of pain in the right foot.  This is the medial side of the foot.  It is below the malleolus.  Again, I am not sure exactly what the real problem is.  However, I will go ahead and put him on some Augmentin (875 mg p.o. twice daily x 10 days) and we will see if this may help a little bit.  He says he is tired of taking the Pradaxa.  He says is caused him to have these skin lesions.  I must say I have never seen this before with Pradaxa.  He says he is going to taper himself off the Pradaxa.  He is going to start taking it daily and then will stop it altogether.  He understands the risk of stopping the Pradaxa.  However, he says he is tired of these skin lesions.  He says that the bleed.  Again I am never seen these with Pradaxa.  However, I suppose 1 could get these.  Again, he understands it completely about the risk of stopping blood thinner.  It would not surprise me if he does get another blood clot.  I told him that if he needs to go to the wound care clinic we can certainly make a referral for this.  He says he going to  start this "taper" in about a month.   He apparently had some spinal ablation done of his lower back.  This helped for a few days.  Overall, I would say that his performance status is probably ECOG 2.     Medications:  Current Outpatient Medications:    acetaminophen (TYLENOL) 500 MG tablet, Take 500 mg by mouth every 6 (six) hours as needed for mild pain or headache., Disp: , Rfl:    carvedilol (COREG) 25 MG tablet, Take 1 tablet (25 mg total) by mouth every evening., Disp: 90 tablet, Rfl: 4   Continuous Glucose Sensor (FREESTYLE LIBRE 3 SENSOR) MISC, Use as directed and change every 14 (fourteen) days., Disp: 6 each, Rfl: 3   dabigatran (PRADAXA) 150 MG CAPS capsule, Take 1 capsule (150 mg total) by mouth 2 (two) times daily., Disp: 60 capsule, Rfl: 6   diclofenac Sodium (VOLTAREN) 1 % GEL, Apply topically., Disp: , Rfl:    docusate sodium (COLACE) 100 MG capsule, Take 100 mg by mouth 2 (two) times daily as needed., Disp: , Rfl:    empagliflozin (JARDIANCE) 25 MG TABS tablet, Take 1 tablet (25 mg total) by mouth daily., Disp: 90 tablet, Rfl: 1  enoxaparin (LOVENOX) 120 MG/0.8ML injection, Inject 0.8 mLs (120 mg total) into the skin 2 (two) times daily. Start on 02/28/2023 for 5 days prior to procedure., Disp: 8 mL, Rfl: 0   fluticasone (FLONASE) 50 MCG/ACT nasal spray, Place 2 sprays into both nostrils daily. (Patient taking differently: Place 1-2 sprays into both nostrils daily as needed for allergies.), Disp: 16 g, Rfl: 5   furosemide (LASIX) 20 MG tablet, Take 20 mg by mouth daily as needed for edema., Disp: , Rfl:    gatifloxacin (ZYMAXID) 0.5 % SOLN, Place 1 drop into the left eye 4 (four) times daily as directed. Store upside down! (Patient not taking: Reported on 01/10/2023), Disp: 5 mL, Rfl: 1   Insulin Degludec FlexTouch 100 UNIT/ML SOPN, Inject 70 Units into the skin daily., Disp: 60 mL, Rfl: 1   Insulin Pen Needle (PEN NEEDLES) 31G X 5 MM MISC, Use 1 as directed once daily., Disp:  100 each, Rfl: 3   ketorolac (ACULAR) 0.5 % ophthalmic solution, Place 1 drop into the left eye 4 (four) times daily as directed. (Patient not taking: Reported on 01/10/2023), Disp: 10 mL, Rfl: 1   lansoprazole (PREVACID) 30 MG capsule, Take 1 capsule (30 mg total) by mouth daily before a meal, Disp: 90 capsule, Rfl: 1   levocetirizine (XYZAL) 5 MG tablet, Take 5 mg by mouth every evening., Disp: , Rfl:    losartan (COZAAR) 50 MG tablet, Take 1 tablet (50 mg total) by mouth daily., Disp: 90 tablet, Rfl: 4   methocarbamol (ROBAXIN) 500 MG tablet, Take 1 tablet (500 mg total) by mouth 4 (four) times a day for 10 days., Disp: 40 tablet, Rfl: 0   nystatin (MYCOSTATIN) 100000 UNIT/ML suspension, Use 4-5 mL for Mouth/Throat as directed four times a day (Patient taking differently: Take 5 mLs by mouth 4 (four) times daily as needed (thrush).), Disp: 240 mL, Rfl: 3   ondansetron (ZOFRAN-ODT) 4 MG disintegrating tablet, Take 1 tablet (4 mg total) by mouth every 8 (eight) hours as needed for nausea or vomiting., Disp: 30 tablet, Rfl: 0   pravastatin (PRAVACHOL) 20 MG tablet, Take 1 tablet (20 mg total) by mouth 2 (two) times a week., Disp: 40 tablet, Rfl: 4   prednisoLONE acetate (PRED FORTE) 1 % ophthalmic suspension, Place 1 drop into the left eye 4 (four) times daily as directed. Please shake well before each use! (Patient not taking: Reported on 01/10/2023), Disp: 5 mL, Rfl: 1   pregabalin (LYRICA) 50 MG capsule, Take 1 capsule (50 mg total) by mouth every evening, 1 to 3 hours before bedtime., Disp: 90 capsule, Rfl: 1   ramelteon (ROZEREM) 8 MG tablet, Take 1 tablet (8 mg total) by mouth at bedtime as needed., Disp: 30 tablet, Rfl: 0   tirzepatide (MOUNJARO) 7.5 MG/0.5ML Pen, Inject 7.5 mg into the skin once a week., Disp: 6 mL, Rfl: 1   traMADol (ULTRAM) 50 MG tablet, Take 1 tablet (50 mg total) by mouth 2 (two) times daily as needed., Disp: 60 tablet, Rfl: 0   triamcinolone cream (KENALOG) 0.1 %, Apply 1  application topically to affected area(s) 2 (two) times daily., Disp: 80 g, Rfl: 3  Allergies:  Allergies  Allergen Reactions   Ditropan [Oxybutynin] Swelling and Other (See Comments)   Quinine Other (See Comments)    Platelets dropped- consumptive coagulopathy     Vibra-Tab [Doxycycline] Other (See Comments)    Epistaxis     Voltaren [Diclofenac] Swelling and Other (See Comments)  Elevated liver enzymes Lip swelling    Zoloft [Sertraline] Other (See Comments)    Insomnia Acute urinary retention   Cymbalta [Duloxetine Hcl] Other (See Comments)    Night sweats   Morphine And Codeine Nausea And Vomiting, Swelling and Other (See Comments)    Facial and lip swelling OK to use IR form of hydrocodone and oxycodone   Oxycontin [Oxycodone Hcl] Swelling and Other (See Comments)    Lip swelling OK to use IR form   Tape Other (See Comments)    Skin tears easily  SKIN IS FRAGILE- CERTAIN BANDAGES TEAR OFF THE SKIN, "BANDAIDS ARE OKAY."   Voltaren [Diclofenac Sodium] Other (See Comments)    Elevated liver enzymes   Zohydro Er [Hydrocodone Bitartrate Er] Swelling    Facial swelling OK to use IR form   Fentanyl Nausea And Vomiting and Other (See Comments)    Nausea, vomiting ad headaches - Per patient "okay to use nothing ever happened with it"   Neurontin [Gabapentin] Other (See Comments)    Unknown reaction     Past Medical History, Surgical history, Social history, and Family History were reviewed and updated.  Review of Systems: Review of Systems  Constitutional:  Positive for fatigue.  HENT:  Negative.    Eyes: Negative.   Respiratory: Negative.    Cardiovascular:  Positive for leg swelling.  Gastrointestinal: Negative.   Endocrine: Negative.   Genitourinary:  Positive for hematuria.   Musculoskeletal:  Positive for back pain, myalgias and neck pain.  Neurological:  Positive for dizziness and headaches.  Hematological: Negative.   Psychiatric/Behavioral: Negative.       Physical Exam:  vitals were not taken for this visit.   Wt Readings from Last 3 Encounters:  01/10/23 259 lb 1.9 oz (117.5 kg)  12/04/22 250 lb (113.4 kg)  09/27/22 260 lb (117.9 kg)    Physical Exam Vitals reviewed.  HENT:     Head: Normocephalic and atraumatic.  Eyes:     Pupils: Pupils are equal, round, and reactive to light.  Cardiovascular:     Rate and Rhythm: Normal rate and regular rhythm.     Heart sounds: Normal heart sounds.  Pulmonary:     Effort: Pulmonary effort is normal.     Breath sounds: Normal breath sounds.  Abdominal:     General: Bowel sounds are normal.     Palpations: Abdomen is soft.  Musculoskeletal:        General: No tenderness or deformity. Normal range of motion.     Cervical back: Normal range of motion.     Comments: His extremities does show some mild swelling in the right leg.  It is not as firm.  He has good warmth to the leg.  He has palpable pulses in the distal extremity.    Lymphadenopathy:     Cervical: No cervical adenopathy.  Skin:    General: Skin is warm and dry.     Findings: No erythema or rash.     Comments: He has these macular type lesions.  They are on his right neck.  They seem to be scabbed over.  He has a couple other type of macular lesions on his upper chest and arms.  Neurological:     Mental Status: He is alert and oriented to person, place, and time.  Psychiatric:        Behavior: Behavior normal.        Thought Content: Thought content normal.  Judgment: Judgment normal.      Lab Results  Component Value Date   WBC 10.0 03/12/2023   HGB 14.3 03/12/2023   HCT 43.1 03/12/2023   MCV 86.2 03/12/2023   PLT 446 (H) 03/12/2023     Chemistry      Component Value Date/Time   NA 139 01/10/2023 1231   K 4.1 01/10/2023 1231   CL 108 01/10/2023 1231   CO2 24 01/10/2023 1231   BUN 20 01/10/2023 1231   CREATININE 1.02 01/10/2023 1231      Component Value Date/Time   CALCIUM 10.6 (H) 01/10/2023 1231    ALKPHOS 67 01/10/2023 1231   AST 16 01/10/2023 1231   ALT 13 01/10/2023 1231   BILITOT 0.4 01/10/2023 1231       Impression and Plan: Mr. Mohamad is a very nice 78 year old white male.  He has had incredible history.  He has had a past history of a neuroendocrine pancreatic tumor.  He apparently was hospitalized for 4 months because of complications.  However, this is not going to be a problem for him in my opinion.  Clearly, he is suffering because of his lower back.  This will be his biggest problem in the long run.  Again he started taking Pradaxa.  He says he can taper himself off.  I told him that he can do this if he wishes.  Again, he clearly understands that he could have another blood clot in the blood clot could be into his lungs and can potentially kill him.  He is willing to take that risk.  Hopefully, the Augmentin will help a little bit with the skin lesions and with the right lower leg.  Again, I really cannot feel anything unusual I saw some swelling which is chronic.  I would like to see him back in about a month.  There is a lot going on with him right now.   Josph Macho, MD 1/20/20252:10 PM

## 2023-03-14 ENCOUNTER — Other Ambulatory Visit (HOSPITAL_BASED_OUTPATIENT_CLINIC_OR_DEPARTMENT_OTHER): Payer: Self-pay

## 2023-03-22 ENCOUNTER — Other Ambulatory Visit (HOSPITAL_BASED_OUTPATIENT_CLINIC_OR_DEPARTMENT_OTHER): Payer: Self-pay

## 2023-03-22 DIAGNOSIS — L0390 Cellulitis, unspecified: Secondary | ICD-10-CM | POA: Diagnosis not present

## 2023-03-22 MED ORDER — MUPIROCIN 2 % EX OINT
1.0000 | TOPICAL_OINTMENT | Freq: Two times a day (BID) | CUTANEOUS | 3 refills | Status: AC
  Filled 2023-03-22: qty 22, 11d supply, fill #0

## 2023-03-22 MED ORDER — SULFAMETHOXAZOLE-TRIMETHOPRIM 800-160 MG PO TABS
1.0000 | ORAL_TABLET | Freq: Two times a day (BID) | ORAL | 1 refills | Status: DC
  Filled 2023-03-22: qty 20, 10d supply, fill #0
  Filled 2023-04-02: qty 20, 10d supply, fill #1

## 2023-04-02 ENCOUNTER — Other Ambulatory Visit (HOSPITAL_BASED_OUTPATIENT_CLINIC_OR_DEPARTMENT_OTHER): Payer: Self-pay

## 2023-04-02 ENCOUNTER — Encounter (HOSPITAL_BASED_OUTPATIENT_CLINIC_OR_DEPARTMENT_OTHER): Payer: Self-pay

## 2023-04-02 ENCOUNTER — Other Ambulatory Visit: Payer: Self-pay

## 2023-04-04 ENCOUNTER — Other Ambulatory Visit (HOSPITAL_BASED_OUTPATIENT_CLINIC_OR_DEPARTMENT_OTHER): Payer: Self-pay

## 2023-04-09 ENCOUNTER — Other Ambulatory Visit (HOSPITAL_BASED_OUTPATIENT_CLINIC_OR_DEPARTMENT_OTHER): Payer: Self-pay

## 2023-04-09 ENCOUNTER — Inpatient Hospital Stay: Payer: PPO

## 2023-04-09 ENCOUNTER — Ambulatory Visit: Payer: PPO | Admitting: Hematology & Oncology

## 2023-04-09 MED ORDER — LANSOPRAZOLE 30 MG PO CPDR
30.0000 mg | DELAYED_RELEASE_CAPSULE | Freq: Every day | ORAL | 1 refills | Status: DC
  Filled 2023-04-09: qty 90, 90d supply, fill #0
  Filled 2023-07-10: qty 90, 90d supply, fill #1

## 2023-04-11 ENCOUNTER — Other Ambulatory Visit (HOSPITAL_BASED_OUTPATIENT_CLINIC_OR_DEPARTMENT_OTHER): Payer: Self-pay

## 2023-04-11 MED ORDER — INSULIN DEGLUDEC FLEXTOUCH 100 UNIT/ML ~~LOC~~ SOPN
55.0000 [IU] | PEN_INJECTOR | Freq: Every day | SUBCUTANEOUS | 1 refills | Status: DC
  Filled 2023-04-11: qty 48, 88d supply, fill #0

## 2023-04-16 ENCOUNTER — Other Ambulatory Visit (HOSPITAL_BASED_OUTPATIENT_CLINIC_OR_DEPARTMENT_OTHER): Payer: Self-pay

## 2023-04-16 MED ORDER — CEPHALEXIN 500 MG PO CAPS
500.0000 mg | ORAL_CAPSULE | Freq: Three times a day (TID) | ORAL | 0 refills | Status: AC
  Filled 2023-04-16: qty 30, 10d supply, fill #0

## 2023-04-20 DIAGNOSIS — G8929 Other chronic pain: Secondary | ICD-10-CM | POA: Diagnosis not present

## 2023-04-20 DIAGNOSIS — M4826 Kissing spine, lumbar region: Secondary | ICD-10-CM | POA: Diagnosis not present

## 2023-04-20 DIAGNOSIS — M5416 Radiculopathy, lumbar region: Secondary | ICD-10-CM | POA: Diagnosis not present

## 2023-04-23 ENCOUNTER — Other Ambulatory Visit: Payer: Self-pay

## 2023-04-23 ENCOUNTER — Inpatient Hospital Stay: Payer: PPO | Attending: Hematology & Oncology

## 2023-04-23 ENCOUNTER — Encounter: Payer: Self-pay | Admitting: Hematology & Oncology

## 2023-04-23 ENCOUNTER — Inpatient Hospital Stay: Payer: PPO | Admitting: Hematology & Oncology

## 2023-04-23 VITALS — BP 120/65 | HR 68 | Temp 97.9°F | Resp 18 | Ht 71.0 in | Wt 248.0 lb

## 2023-04-23 DIAGNOSIS — Z8507 Personal history of malignant neoplasm of pancreas: Secondary | ICD-10-CM | POA: Diagnosis not present

## 2023-04-23 DIAGNOSIS — D6851 Activated protein C resistance: Secondary | ICD-10-CM | POA: Diagnosis not present

## 2023-04-23 DIAGNOSIS — I82401 Acute embolism and thrombosis of unspecified deep veins of right lower extremity: Secondary | ICD-10-CM

## 2023-04-23 DIAGNOSIS — M545 Low back pain, unspecified: Secondary | ICD-10-CM | POA: Insufficient documentation

## 2023-04-23 DIAGNOSIS — M79661 Pain in right lower leg: Secondary | ICD-10-CM | POA: Diagnosis not present

## 2023-04-23 LAB — CMP (CANCER CENTER ONLY)
ALT: 16 U/L (ref 0–44)
AST: 17 U/L (ref 15–41)
Albumin: 4.3 g/dL (ref 3.5–5.0)
Alkaline Phosphatase: 78 U/L (ref 38–126)
Anion gap: 8 (ref 5–15)
BUN: 21 mg/dL (ref 8–23)
CO2: 23 mmol/L (ref 22–32)
Calcium: 10.5 mg/dL — ABNORMAL HIGH (ref 8.9–10.3)
Chloride: 106 mmol/L (ref 98–111)
Creatinine: 1.03 mg/dL (ref 0.61–1.24)
GFR, Estimated: >60 mL/min (ref >60)
Glucose, Bld: 172 mg/dL — ABNORMAL HIGH (ref 70–99)
Potassium: 4.1 mmol/L (ref 3.5–5.1)
Sodium: 137 mmol/L (ref 135–145)
Total Bilirubin: 0.5 mg/dL (ref 0.0–1.2)
Total Protein: 7.3 g/dL (ref 6.5–8.1)

## 2023-04-23 LAB — CBC WITH DIFFERENTIAL (CANCER CENTER ONLY)
Abs Immature Granulocytes: 0.08 10*3/uL — ABNORMAL HIGH (ref 0.00–0.07)
Basophils Absolute: 0.1 10*3/uL (ref 0.0–0.1)
Basophils Relative: 1 %
Eosinophils Absolute: 0.6 10*3/uL — ABNORMAL HIGH (ref 0.0–0.5)
Eosinophils Relative: 6 %
HCT: 43.3 % (ref 39.0–52.0)
Hemoglobin: 14.2 g/dL (ref 13.0–17.0)
Immature Granulocytes: 1 %
Lymphocytes Relative: 32 %
Lymphs Abs: 2.7 10*3/uL (ref 0.7–4.0)
MCH: 28.7 pg (ref 26.0–34.0)
MCHC: 32.8 g/dL (ref 30.0–36.0)
MCV: 87.7 fL (ref 80.0–100.0)
Monocytes Absolute: 1.1 10*3/uL — ABNORMAL HIGH (ref 0.1–1.0)
Monocytes Relative: 13 %
Neutro Abs: 4.1 10*3/uL (ref 1.7–7.7)
Neutrophils Relative %: 47 %
Platelet Count: 350 10*3/uL (ref 150–400)
RBC: 4.94 MIL/uL (ref 4.22–5.81)
RDW: 15.9 % — ABNORMAL HIGH (ref 11.5–15.5)
WBC Count: 8.7 10*3/uL (ref 4.0–10.5)
nRBC: 0 % (ref 0.0–0.2)

## 2023-04-23 NOTE — Progress Notes (Signed)
 : Hematology and Oncology Follow Up Visit  Mannix Kroeker [id number] 02/21/1945 78 y.o. 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 Pradaxa 150 mg p.o. twice daily-start on 12/05/2022 --patient is now off Pradaxa     Interim History:  Mr. Lacko is back for follow-up.  Unfortunately, he still having a lot of problems with his right lower leg.  He now is off Pradaxa.  He has been off it now for about 3 weeks.  He said that once he stopped the Pradaxa, his skin lesions went away.    He is having a lot more pain in the right lower leg.  We have done a MRI of the right lower leg.  This was back in January.  This was not show anything that looks like thromboembolic disease.  He had some edema.  I think we have I do need to get another Doppler.  However the right lower leg is very painful.  He is also having a lot of problems with his lower back.  Apparently, the ablation did not help.  At some 1 point, he was considered for a spinal stimulator.  However, it seemed as if the surgeon would not do this because of him being on blood thinner.  I really do not see a problem with him being on a blood thinner.  We can always take him off.  Again he is not on anything right now.  I will see if we cannot get him into our spinal surgeon-Dr. Yevette Edwards and see if he might be able to help out his for lower back.  His blood sugars are doing better.  He is lost weight.  He says he is not eating much because of the pain that he is in with his right lower leg and his lower back.  Currently, I would say that his performance status is probably ECOG 2.       Medications:  Current Outpatient Medications:    acetaminophen (TYLENOL) 500 MG  tablet, Take 500 mg by mouth every 6 (six) hours as needed for mild pain or headache., Disp: , Rfl:    carvedilol (COREG) 25 MG tablet, Take 1 tablet (25 mg total) by mouth every evening., Disp: 90 tablet, Rfl: 4   cephALEXin (KEFLEX) 500 MG capsule, Take 1 capsule (500 mg total) by mouth 3 (three) times daily for 10 days., Disp: 30 capsule, Rfl: 0   Continuous Glucose Sensor (FREESTYLE LIBRE 3 SENSOR) MISC, Use as directed and change every 14 (fourteen) days., Disp: 6 each, Rfl: 3   diclofenac Sodium (VOLTAREN) 1 % GEL, Apply topically., Disp: , Rfl:    docusate sodium (COLACE) 100 MG capsule, Take 100 mg by mouth 2 (two) times daily as needed., Disp: , Rfl:    empagliflozin (JARDIANCE) 25 MG TABS tablet, Take 1 tablet (25 mg total) by mouth daily., Disp: 90 tablet, Rfl: 1   fluticasone (FLONASE) 50 MCG/ACT nasal spray, Place 2 sprays into both nostrils daily. (Patient taking differently: Place 1-2 sprays into both nostrils daily as needed for allergies.), Disp: 16 g, Rfl: 5   furosemide (LASIX) 20 MG tablet, Take 20 mg by mouth daily as needed for edema., Disp: , Rfl:  Insulin Degludec FlexTouch 100 UNIT/ML SOPN, Inject 70 Units into the skin daily. (Patient taking differently: Inject 60 Units into the skin daily.), Disp: 60 mL, Rfl: 1   Insulin Pen Needle (PEN NEEDLES) 31G X 5 MM MISC, Use 1 as directed once daily., Disp: 100 each, Rfl: 3   lansoprazole (PREVACID) 30 MG capsule, Take 1 capsule (30 mg) by mouth once a day before a meal., Disp: 90 capsule, Rfl: 1   levocetirizine (XYZAL) 5 MG tablet, Take 5 mg by mouth every evening., Disp: , Rfl:    losartan (COZAAR) 50 MG tablet, Take 1 tablet (50 mg total) by mouth daily., Disp: 90 tablet, Rfl: 4   methocarbamol (ROBAXIN) 500 MG tablet, Take 1 tablet (500 mg total) by mouth 4 (four) times a day for 10 days., Disp: 40 tablet, Rfl: 0   mupirocin ointment (BACTROBAN) 2 %, Apply 1 Application topically 2 (two) times daily for 5 days., Disp: 22 g,  Rfl: 3   nystatin (MYCOSTATIN) 100000 UNIT/ML suspension, Use 4-5 mL for Mouth/Throat as directed four times a day (Patient taking differently: Take 5 mLs by mouth 4 (four) times daily as needed (thrush).), Disp: 240 mL, Rfl: 3   Omega-3 Fatty Acids (FISH OIL) 1000 MG CAPS, Take 1 capsule by mouth daily at 6 (six) AM., Disp: , Rfl:    ondansetron (ZOFRAN-ODT) 4 MG disintegrating tablet, Take 1 tablet (4 mg total) by mouth every 8 (eight) hours as needed for nausea or vomiting., Disp: 30 tablet, Rfl: 0   pravastatin (PRAVACHOL) 20 MG tablet, Take 1 tablet (20 mg total) by mouth 2 (two) times a week., Disp: 40 tablet, Rfl: 4   ramelteon (ROZEREM) 8 MG tablet, Take 1 tablet (8 mg total) by mouth at bedtime as needed., Disp: 30 tablet, Rfl: 0   tirzepatide (MOUNJARO) 7.5 MG/0.5ML Pen, Inject 7.5 mg into the skin once a week., Disp: 6 mL, Rfl: 1   traMADol (ULTRAM) 50 MG tablet, Take 1 tablet (50 mg total) by mouth 2 (two) times daily as needed., Disp: 60 tablet, Rfl: 0   triamcinolone cream (KENALOG) 0.1 %, Apply 1 application topically to affected area(s) 2 (two) times daily., Disp: 80 g, Rfl: 3  Allergies:  Allergies  Allergen Reactions   Ditropan [Oxybutynin] Swelling and Other (See Comments)   Quinine Other (See Comments)    Platelets dropped- consumptive coagulopathy     Vibra-Tab [Doxycycline] Other (See Comments)    Epistaxis     Voltaren [Diclofenac] Swelling and Other (See Comments)    Elevated liver enzymes Lip swelling    Zoloft [Sertraline] Other (See Comments)    Insomnia Acute urinary retention   Cymbalta [Duloxetine Hcl] Other (See Comments)    Night sweats   Morphine And Codeine Nausea And Vomiting, Swelling and Other (See Comments)    Facial and lip swelling OK to use IR form of hydrocodone and oxycodone   Oxycontin [Oxycodone Hcl] Swelling and Other (See Comments)    Lip swelling OK to use IR form   Tape Other (See Comments)    Skin tears easily  SKIN IS FRAGILE-  CERTAIN BANDAGES TEAR OFF THE SKIN, "BANDAIDS ARE OKAY."   Voltaren [Diclofenac Sodium] Other (See Comments)    Elevated liver enzymes   Zohydro Er [Hydrocodone Bitartrate Er] Swelling    Facial swelling OK to use IR form   Fentanyl Nausea And Vomiting and Other (See Comments)    Nausea, vomiting ad headaches - Per patient "okay to use nothing ever  happened with it"   Neurontin [Gabapentin] Other (See Comments)    Unknown reaction     Past Medical History, Surgical history, Social history, and Family History were reviewed and updated.  Review of Systems: Review of Systems  Constitutional:  Positive for fatigue.  HENT:  Negative.    Eyes: Negative.   Respiratory: Negative.    Cardiovascular:  Positive for leg swelling.  Gastrointestinal: Negative.   Endocrine: Negative.   Genitourinary:  Positive for hematuria.   Musculoskeletal:  Positive for back pain, myalgias and neck pain.  Neurological:  Positive for dizziness and headaches.  Hematological: Negative.   Psychiatric/Behavioral: Negative.      Physical Exam: Vital signs are temperature of 97.9.  Pulse 68.  Blood pressure 120/65.  Weight is 248 pounds.  Wt Readings from Last 3 Encounters:  03/12/23 254 lb (115.2 kg)  01/10/23 259 lb 1.9 oz (117.5 kg)  12/04/22 250 lb (113.4 kg)    Physical Exam Vitals reviewed.  HENT:     Head: Normocephalic and atraumatic.  Eyes:     Pupils: Pupils are equal, round, and reactive to light.  Cardiovascular:     Rate and Rhythm: Normal rate and regular rhythm.     Heart sounds: Normal heart sounds.  Pulmonary:     Effort: Pulmonary effort is normal.     Breath sounds: Normal breath sounds.  Abdominal:     General: Bowel sounds are normal.     Palpations: Abdomen is soft.  Musculoskeletal:        General: No tenderness or deformity. Normal range of motion.     Cervical back: Normal range of motion.     Comments: His extremities does show some mild swelling in the right leg.  It  is not as firm.  He has good warmth to the leg.  He has palpable pulses in the distal extremity.    Lymphadenopathy:     Cervical: No cervical adenopathy.  Skin:    General: Skin is warm and dry.     Findings: No erythema or rash.  Neurological:     Mental Status: He is alert and oriented to person, place, and time.  Psychiatric:        Behavior: Behavior normal.        Thought Content: Thought content normal.        Judgment: Judgment normal.      Lab Results  Component Value Date   WBC 8.7 04/23/2023   HGB 14.2 04/23/2023   HCT 43.3 04/23/2023   MCV 87.7 04/23/2023   PLT 350 04/23/2023     Chemistry      Component Value Date/Time   NA 135 03/12/2023 1349   K 4.5 03/12/2023 1349   CL 104 03/12/2023 1349   CO2 23 03/12/2023 1349   BUN 22 03/12/2023 1349   CREATININE 1.03 03/12/2023 1349      Component Value Date/Time   CALCIUM 10.4 (H) 03/12/2023 1349   ALKPHOS 89 03/12/2023 1349   AST 15 03/12/2023 1349   ALT 16 03/12/2023 1349   BILITOT 0.6 03/12/2023 1349       Impression and Plan: Mr. Pingree is a very nice 78 year old white male.  He has had incredible history.  He has had a past history of a neuroendocrine pancreatic tumor.  He apparently was hospitalized for 4 months because of complications.  However, this is not going to be a problem for him in my opinion.  Clearly, he is suffering because of  his lower back.  This will be his biggest problem in the long run.  I really think that a spinal stimulator could help him out.  I will have to talk with Dr. Yevette Edwards SPINAL Surgery and see if he can help [place] out.  I do think a Doppler of his right lower leg would not be a bad idea.  Hopefully, there is no problems with thromboembolic disease.  I realize that Mr. Rachel just has a lot of health issues.  I really feel that for him.  I know that a lot of this is some that we really are not involved with but yet I feel that we really need to try to do our best to help him  out.  Will see about getting him back to see [place] in about 6 weeks or so.   Josph Macho, MD 3/3/20253:18 PM

## 2023-04-25 ENCOUNTER — Ambulatory Visit (HOSPITAL_BASED_OUTPATIENT_CLINIC_OR_DEPARTMENT_OTHER)
Admission: RE | Admit: 2023-04-25 | Discharge: 2023-04-25 | Disposition: A | Discharge status: Home / Self Care | Source: Ambulatory Visit | Attending: Hematology & Oncology | Admitting: Hematology & Oncology

## 2023-04-25 DIAGNOSIS — I82401 Acute embolism and thrombosis of unspecified deep veins of right lower extremity: Secondary | ICD-10-CM | POA: Diagnosis present

## 2023-04-25 DIAGNOSIS — M79661 Pain in right lower leg: Secondary | ICD-10-CM | POA: Diagnosis not present

## 2023-04-25 DIAGNOSIS — I82501 Chronic embolism and thrombosis of unspecified deep veins of right lower extremity: Secondary | ICD-10-CM | POA: Diagnosis not present

## 2023-04-25 DIAGNOSIS — M7989 Other specified soft tissue disorders: Secondary | ICD-10-CM | POA: Diagnosis not present

## 2023-04-26 ENCOUNTER — Telehealth: Payer: Self-pay

## 2023-04-26 NOTE — Telephone Encounter (Signed)
-----   Message from Josph Macho sent at 04/25/2023  5:21 PM EST ----- Call and let him know that the Doppler does not show any evidence of worsening or new thrombus in the right leg.

## 2023-04-26 NOTE — Telephone Encounter (Signed)
 Advised via MyChart.

## 2023-05-04 ENCOUNTER — Other Ambulatory Visit (HOSPITAL_BASED_OUTPATIENT_CLINIC_OR_DEPARTMENT_OTHER): Payer: Self-pay

## 2023-05-04 DIAGNOSIS — Z794 Long term (current) use of insulin: Secondary | ICD-10-CM | POA: Diagnosis not present

## 2023-05-04 DIAGNOSIS — E113393 Type 2 diabetes mellitus with moderate nonproliferative diabetic retinopathy without macular edema, bilateral: Secondary | ICD-10-CM | POA: Diagnosis not present

## 2023-05-04 DIAGNOSIS — I10 Essential (primary) hypertension: Secondary | ICD-10-CM | POA: Diagnosis not present

## 2023-05-04 DIAGNOSIS — E782 Mixed hyperlipidemia: Secondary | ICD-10-CM | POA: Diagnosis not present

## 2023-05-04 MED ORDER — JARDIANCE 25 MG PO TABS
25.0000 mg | ORAL_TABLET | Freq: Every day | ORAL | 1 refills | Status: DC
  Filled 2023-05-04 – 2023-05-07 (×2): qty 90, 90d supply, fill #0
  Filled 2023-09-05: qty 90, 90d supply, fill #1

## 2023-05-04 MED ORDER — INSULIN PEN NEEDLE 31G X 5 MM MISC
3 refills | Status: AC
  Filled 2023-05-04: qty 100, 100d supply, fill #0

## 2023-05-04 MED ORDER — INSULIN DEGLUDEC FLEXTOUCH 100 UNIT/ML ~~LOC~~ SOPN
40.0000 [IU] | PEN_INJECTOR | Freq: Every day | SUBCUTANEOUS | 1 refills | Status: AC
  Filled 2023-05-04: qty 45, 112d supply, fill #0
  Filled 2023-11-06: qty 39, 97d supply, fill #0
  Filled 2024-02-12: qty 39, 97d supply, fill #1

## 2023-05-04 MED ORDER — MOUNJARO 7.5 MG/0.5ML ~~LOC~~ SOAJ
7.5000 mg | SUBCUTANEOUS | 1 refills | Status: DC
  Filled 2023-05-04: qty 6, 84d supply, fill #0
  Filled 2023-07-30: qty 6, 84d supply, fill #1

## 2023-05-07 ENCOUNTER — Other Ambulatory Visit (HOSPITAL_BASED_OUTPATIENT_CLINIC_OR_DEPARTMENT_OTHER): Payer: Self-pay

## 2023-05-07 MED ORDER — TRAMADOL HCL 50 MG PO TABS
50.0000 mg | ORAL_TABLET | Freq: Two times a day (BID) | ORAL | 1 refills | Status: DC | PRN
  Filled 2023-05-07: qty 60, 30d supply, fill #0
  Filled 2023-06-06: qty 60, 30d supply, fill #1

## 2023-05-25 ENCOUNTER — Inpatient Hospital Stay: Attending: Hematology & Oncology

## 2023-05-25 ENCOUNTER — Encounter: Payer: Self-pay | Admitting: Hematology & Oncology

## 2023-05-25 ENCOUNTER — Inpatient Hospital Stay: Admitting: Hematology & Oncology

## 2023-05-25 VITALS — BP 125/48 | HR 62 | Temp 97.9°F | Resp 20 | Ht 71.0 in | Wt 239.0 lb

## 2023-05-25 DIAGNOSIS — I82411 Acute embolism and thrombosis of right femoral vein: Secondary | ICD-10-CM

## 2023-05-25 DIAGNOSIS — I82401 Acute embolism and thrombosis of unspecified deep veins of right lower extremity: Secondary | ICD-10-CM

## 2023-05-25 DIAGNOSIS — E119 Type 2 diabetes mellitus without complications: Secondary | ICD-10-CM | POA: Insufficient documentation

## 2023-05-25 DIAGNOSIS — D75839 Thrombocytosis, unspecified: Secondary | ICD-10-CM

## 2023-05-25 DIAGNOSIS — D6851 Activated protein C resistance: Secondary | ICD-10-CM | POA: Insufficient documentation

## 2023-05-25 DIAGNOSIS — M5440 Lumbago with sciatica, unspecified side: Secondary | ICD-10-CM | POA: Diagnosis not present

## 2023-05-25 DIAGNOSIS — D3A8 Other benign neuroendocrine tumors: Secondary | ICD-10-CM

## 2023-05-25 DIAGNOSIS — G8929 Other chronic pain: Secondary | ICD-10-CM | POA: Diagnosis not present

## 2023-05-25 LAB — CBC WITH DIFFERENTIAL (CANCER CENTER ONLY)
Abs Immature Granulocytes: 0.01 10*3/uL (ref 0.00–0.07)
Basophils Absolute: 0.1 10*3/uL (ref 0.0–0.1)
Basophils Relative: 2 %
Eosinophils Absolute: 0.5 10*3/uL (ref 0.0–0.5)
Eosinophils Relative: 6 %
HCT: 43.1 % (ref 39.0–52.0)
Hemoglobin: 14.2 g/dL (ref 13.0–17.0)
Immature Granulocytes: 0 %
Lymphocytes Relative: 40 %
Lymphs Abs: 3.1 10*3/uL (ref 0.7–4.0)
MCH: 29.5 pg (ref 26.0–34.0)
MCHC: 32.9 g/dL (ref 30.0–36.0)
MCV: 89.6 fL (ref 80.0–100.0)
Monocytes Absolute: 1.1 10*3/uL — ABNORMAL HIGH (ref 0.1–1.0)
Monocytes Relative: 14 %
Neutro Abs: 2.9 10*3/uL (ref 1.7–7.7)
Neutrophils Relative %: 38 %
Platelet Count: 411 10*3/uL — ABNORMAL HIGH (ref 150–400)
RBC: 4.81 MIL/uL (ref 4.22–5.81)
RDW: 16.2 % — ABNORMAL HIGH (ref 11.5–15.5)
WBC Count: 7.6 10*3/uL (ref 4.0–10.5)
nRBC: 0 % (ref 0.0–0.2)

## 2023-05-25 LAB — CMP (CANCER CENTER ONLY)
ALT: 14 U/L (ref 0–44)
AST: 13 U/L — ABNORMAL LOW (ref 15–41)
Albumin: 4 g/dL (ref 3.5–5.0)
Alkaline Phosphatase: 69 U/L (ref 38–126)
Anion gap: 7 (ref 5–15)
BUN: 18 mg/dL (ref 8–23)
CO2: 26 mmol/L (ref 22–32)
Calcium: 10.7 mg/dL — ABNORMAL HIGH (ref 8.9–10.3)
Chloride: 107 mmol/L (ref 98–111)
Creatinine: 0.89 mg/dL (ref 0.61–1.24)
GFR, Estimated: >60 mL/min (ref >60)
Glucose, Bld: 162 mg/dL — ABNORMAL HIGH (ref 70–99)
Potassium: 4.7 mmol/L (ref 3.5–5.1)
Sodium: 140 mmol/L (ref 135–145)
Total Bilirubin: 0.6 mg/dL (ref 0.0–1.2)
Total Protein: 7 g/dL (ref 6.5–8.1)

## 2023-05-25 LAB — HEMOGLOBIN A1C
Hgb A1c MFr Bld: 7.1 % — ABNORMAL HIGH (ref 4.8–5.6)
Mean Plasma Glucose: 157.07 mg/dL

## 2023-05-25 LAB — D-DIMER, QUANTITATIVE: D-Dimer, Quant: 0.98 ug{FEU}/mL — ABNORMAL HIGH (ref 0.00–0.50)

## 2023-05-25 NOTE — Progress Notes (Signed)
 : Hematology and Oncology Follow Up Visit  Derek [name] [id number] February 12, 1946 78 y.o. 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 Pradaxa 150 mg p.o. twice daily-start on 12/05/2022 --patient is now off Pradaxa     Interim History:  Mr. Chuong is back for follow-up.  He is still having some issues.  Apparently got have a Doppler of the right leg in a week or so.  He will then see Vascular Surgery.  His complaint now is that he feels some masses in his abdomen.  These are just below the umbilicus.  There is on both sides of the midline.  When I palpate these areas, it just feels like it may be thickened pannus.  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  Will have to get a CT scan to make sure there is nothing going on.  He continues off his Pradaxa.  There is been no further bruising or bleeding.  He has no ecchymoses or hematomas on his skin.  His back still bothers him.  Unfortunately, there really is nothing that can be done for his back.  He has had no problems going to the bathroom.  He has had no issues with bowels or bladder.  He has had no cough.  There is no shortness of breath.  He has had no chest wall pain.  Currently, I would have to say that his performance status is probably ECOG 2.         Medications:  Current Outpatient Medications:    acetaminophen (TYLENOL) 500 MG tablet, Take 500 mg by mouth every 6 (six) hours as needed for mild pain or headache., Disp: , Rfl:    carvedilol (COREG) 25 MG tablet, Take 1 tablet (25 mg total) by mouth every evening., Disp: 90 tablet, Rfl:  4   Continuous Glucose Sensor (FREESTYLE LIBRE 3 SENSOR) MISC, Use as directed and change every 14 (fourteen) days., Disp: 6 each, Rfl: 3   diclofenac Sodium (VOLTAREN) 1 % GEL, Apply topically., Disp: , Rfl:    docusate sodium (COLACE) 100 MG capsule, Take 100 mg by mouth 2 (two) times daily as needed. (Patient not taking: Reported on 05/25/2023), Disp: , Rfl:    empagliflozin (JARDIANCE) 25 MG TABS tablet, Take 1 tablet (25 mg total) by mouth daily. (Patient not taking: Reported on 05/25/2023), Disp: 90 tablet, Rfl: 1   empagliflozin (JARDIANCE) 25 MG TABS tablet, Take 1 tablet (25 mg total) by mouth daily., Disp: 90 tablet, Rfl: 1   fluticasone (FLONASE) 50 MCG/ACT nasal spray, Place 2 sprays into both nostrils daily. (Patient not taking: Reported on 05/25/2023), Disp: 16 g, Rfl: 5   furosemide (LASIX) 20 MG tablet, Take 20 mg by mouth daily as needed for edema., Disp: , Rfl:    Insulin Degludec FlexTouch 100 UNIT/ML SOPN, Inject 40 Units into the skin daily. (Patient taking differently: Inject 40 Units into the skin daily. On sliding scale.), Disp: 45 mL, Rfl: 1   Insulin Pen Needle (PEN NEEDLES) 31G X 5 MM MISC, Use 1 as directed once daily., Disp: 100 each, Rfl: 3  Insulin Pen Needle 31G X 5 MM MISC, Use as directed 1 time daily., Disp: 100 each, Rfl: 3   lansoprazole (PREVACID) 30 MG capsule, Take 1 capsule (30 mg) by mouth once a day before a meal., Disp: 90 capsule, Rfl: 1   levocetirizine (XYZAL) 5 MG tablet, Take 5 mg by mouth every evening., Disp: , Rfl:    losartan (COZAAR) 50 MG tablet, Take 1 tablet (50 mg total) by mouth daily., Disp: 90 tablet, Rfl: 4   methocarbamol (ROBAXIN) 500 MG tablet, Take 1 tablet (500 mg total) by mouth 4 (four) times a day for 10 days. (Patient not taking: Reported on 05/25/2023), Disp: 40 tablet, Rfl: 0   mupirocin ointment (BACTROBAN) 2 %, Apply 1 Application topically 2 (two) times daily for 5 days., Disp: 22 g, Rfl: 3   nystatin (MYCOSTATIN) 100000 UNIT/ML  suspension, Use 4-5 mL for Mouth/Throat as directed four times a day (Patient not taking: Reported on 05/25/2023), Disp: 240 mL, Rfl: 3   Omega-3 Fatty Acids (FISH OIL) 1000 MG CAPS, Take 1 capsule by mouth daily at 6 (six) AM., Disp: , Rfl:    ondansetron (ZOFRAN-ODT) 4 MG disintegrating tablet, Take 1 tablet (4 mg total) by mouth every 8 (eight) hours as needed for nausea or vomiting., Disp: 30 tablet, Rfl: 0   pravastatin (PRAVACHOL) 20 MG tablet, Take 1 tablet (20 mg total) by mouth 2 (two) times a week., Disp: 40 tablet, Rfl: 4   ramelteon (ROZEREM) 8 MG tablet, Take 1 tablet (8 mg total) by mouth at bedtime as needed., Disp: 30 tablet, Rfl: 0   tirzepatide (MOUNJARO) 7.5 MG/0.5ML Pen, Inject 7.5 mg into the skin once a week., Disp: 6 mL, Rfl: 1   traMADol (ULTRAM) 50 MG tablet, Take 1 tablet (50 mg total) by mouth 2 (two) times daily as needed., Disp: 60 tablet, Rfl: 1   triamcinolone cream (KENALOG) 0.1 %, Apply 1 application topically to affected area(s) 2 (two) times daily., Disp: 80 g, Rfl: 3  Allergies:  Allergies  Allergen Reactions   Ditropan [Oxybutynin] Swelling and Other (See Comments)   Quinine Other (See Comments)    Platelets dropped- consumptive coagulopathy     Vibra-Tab [Doxycycline] Other (See Comments)    Epistaxis     Voltaren [Diclofenac] Swelling and Other (See Comments)    Elevated liver enzymes Lip swelling    Zoloft [Sertraline] Other (See Comments)    Insomnia Acute urinary retention   Cymbalta [Duloxetine Hcl] Other (See Comments)    Night sweats   Morphine And Codeine Nausea And Vomiting, Swelling and Other (See Comments)    Facial and lip swelling OK to use IR form of hydrocodone and oxycodone   Oxycontin [Oxycodone Hcl] Swelling and Other (See Comments)    Lip swelling OK to use IR form   Tape Other (See Comments)    Skin tears easily  SKIN IS FRAGILE- CERTAIN BANDAGES TEAR OFF THE SKIN, "BANDAIDS ARE OKAY."   Voltaren [Diclofenac Sodium] Other  (See Comments)    Elevated liver enzymes   Zohydro Er [Hydrocodone Bitartrate Er] Swelling    Facial swelling OK to use IR form   Fentanyl Nausea And Vomiting and Other (See Comments)    Nausea, vomiting ad headaches - Per patient "okay to use nothing ever happened with it"   Neurontin [Gabapentin] Other (See Comments)    Unknown reaction     Past Medical History, Surgical history, Social history, and Family History were reviewed and updated.  Review  of Systems: Review of Systems  Constitutional:  Positive for fatigue.  HENT:  Negative.    Eyes: Negative.   Respiratory: Negative.    Cardiovascular:  Positive for leg swelling.  Gastrointestinal: Negative.   Endocrine: Negative.   Genitourinary:  Positive for hematuria.   Musculoskeletal:  Positive for back pain, myalgias and neck pain.  Neurological:  Positive for dizziness and headaches.  Hematological: Negative.   Psychiatric/Behavioral: Negative.      Physical Exam: Vital signs are temperature of 97.9.  Pulse 62.  Blood pressure 125/48.  Weight is 239 pounds.   Wt Readings from Last 3 Encounters:  04/23/23 248 lb (112.5 kg)  03/12/23 254 lb (115.2 kg)  01/10/23 259 lb 1.9 oz (117.5 kg)    Physical Exam Vitals reviewed.  HENT:     Head: Normocephalic and atraumatic.  Eyes:     Pupils: Pupils are equal, round, and reactive to light.  Cardiovascular:     Rate and Rhythm: Normal rate and regular rhythm.     Heart sounds: Normal heart sounds.  Pulmonary:     Effort: Pulmonary effort is normal.     Breath sounds: Normal breath sounds.  Abdominal:     General: Bowel sounds are normal.     Palpations: Abdomen is soft.     Comments: His abdomen is obese.  He has a laparotomy wound.  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  He may have little bit of fullness and firmness.  I am not impressed with the actual abdominal mass.  His liver is not palpable.  Musculoskeletal:        General: No tenderness or  deformity. Normal range of motion.     Cervical back: Normal range of motion.     Comments: His extremities does show some mild swelling in the right leg.  It is not as firm.  He has good warmth to the leg.  He has palpable pulses in the distal extremity.    Lymphadenopathy:     Cervical: No cervical adenopathy.  Skin:    General: Skin is warm and dry.     Findings: No erythema or rash.  Neurological:     Mental Status: He is alert and oriented to person, place, and time.  Psychiatric:        Behavior: Behavior normal.        Thought Content: Thought content normal.        Judgment: Judgment normal.      Lab Results  Component Value Date   WBC 7.6 05/25/2023   HGB 14.2 05/25/2023   HCT 43.1 05/25/2023   MCV 89.6 05/25/2023   PLT 411 (H) 05/25/2023     Chemistry      Component Value Date/Time   NA 137 04/23/2023 1445   K 4.1 04/23/2023 1445   CL 106 04/23/2023 1445   CO2 23 04/23/2023 1445   BUN 21 04/23/2023 1445   CREATININE 1.03 04/23/2023 1445      Component Value Date/Time   CALCIUM 10.5 (H) 04/23/2023 1445   ALKPHOS 78 04/23/2023 1445   AST 17 04/23/2023 1445   ALT 16 04/23/2023 1445   BILITOT 0.5 04/23/2023 1445       Impression and Plan: Mr. Rochin is a very nice 78 year old white male.  He has had incredible history.  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  He apparently was hospitalized for  4 months because of complications.  However, this is not going to be a problem for him in my opinion.  We will see about a CT scan of his abdomen and pelvis.  I had to believe that we are feeling this probably thickened adipose tissue.  He will be interesting to see what the Vascular Surgeon has to say about his right leg.  Again he will not take anticoagulation.  We will plan to see him back in about a month or so.  I would just feel bad that his quality of life is so horrible because of his back.   Josph Macho, MD 4/4/20253:46 PM

## 2023-05-28 ENCOUNTER — Other Ambulatory Visit: Payer: Self-pay

## 2023-05-28 ENCOUNTER — Ambulatory Visit (HOSPITAL_BASED_OUTPATIENT_CLINIC_OR_DEPARTMENT_OTHER)
Admission: RE | Admit: 2023-05-28 | Discharge: 2023-05-28 | Disposition: A | Discharge status: Home / Self Care | Source: Ambulatory Visit | Attending: Hematology & Oncology | Admitting: Hematology & Oncology

## 2023-05-28 DIAGNOSIS — D3A8 Other benign neuroendocrine tumors: Secondary | ICD-10-CM | POA: Insufficient documentation

## 2023-05-28 DIAGNOSIS — R109 Unspecified abdominal pain: Secondary | ICD-10-CM | POA: Diagnosis not present

## 2023-05-28 DIAGNOSIS — K449 Diaphragmatic hernia without obstruction or gangrene: Secondary | ICD-10-CM | POA: Diagnosis not present

## 2023-05-28 DIAGNOSIS — K8020 Calculus of gallbladder without cholecystitis without obstruction: Secondary | ICD-10-CM | POA: Diagnosis not present

## 2023-05-28 DIAGNOSIS — I872 Venous insufficiency (chronic) (peripheral): Secondary | ICD-10-CM

## 2023-05-28 DIAGNOSIS — K439 Ventral hernia without obstruction or gangrene: Secondary | ICD-10-CM | POA: Diagnosis not present

## 2023-05-28 MED ORDER — IOHEXOL 300 MG/ML  SOLN
100.0000 mL | Freq: Once | INTRAMUSCULAR | Status: AC | PRN
  Administered 2023-05-28: 100 mL via INTRAVENOUS

## 2023-05-30 ENCOUNTER — Encounter: Payer: Self-pay | Admitting: *Deleted

## 2023-05-30 ENCOUNTER — Telehealth: Payer: Self-pay | Admitting: Dietician

## 2023-05-30 NOTE — Telephone Encounter (Signed)
Patient screened on [facility]. First attempt to reach. Provided my cell# on voice mail and in text to return call to set up a nutrition consult. ? ?Cyndi , RDN, LDN ?Registered Dietitian, [facility] ?Part Time Remote (Usual office hours: Tuesday-Thursday) ?Cell: [phone]   ?

## 2023-06-06 ENCOUNTER — Other Ambulatory Visit: Payer: Self-pay

## 2023-06-06 ENCOUNTER — Ambulatory Visit (HOSPITAL_COMMUNITY)
Admission: RE | Admit: 2023-06-06 | Discharge: 2023-06-06 | Disposition: A | Discharge status: Home / Self Care | Source: Ambulatory Visit | Attending: Vascular Surgery | Admitting: Vascular Surgery

## 2023-06-06 DIAGNOSIS — I872 Venous insufficiency (chronic) (peripheral): Secondary | ICD-10-CM | POA: Diagnosis not present

## 2023-06-11 NOTE — Progress Notes (Unsigned)
 Patient name: Derek VOGEL Sr. MRN: [id number] DOB: 1945/06/09 Sex: male  REASON FOR CONSULT: Right lower extremity cellulitis with edema and chronic DVT  HPI: Derek ENGRAM Sr. is a 78 y.o. 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  He is well-known to vascular surgery.  Previously seen on with evidence of multiple lower extremity DVTs.  08/06/2020 was diagnosed with a right posterior tibial DVT.  On 08/13/2020 was diagnosed with a left posterior tibial DVT.  Subsequently on 11/07/2020 had nonocclusive DVT in the right common femoral vein.  He is followed by Dr. Maria Shiner.  Apparently has stopped his Pradaxa  and no longer wants to be on anticoagulation.  Past Medical History:  Diagnosis Date   Anxiety    Arthritis    Bilateral leg cramps    takes magnesium    BPH (benign prostatic hyperplasia)    DDD (degenerative disc disease), lumbar    gets back injections    Deafness in right ear    meniere's disease   Depression    Dermatitis    bilateral hands   Dyspnea    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" no pulmonary md, sees dr Jimmey Mould   Factor V Leiden mutation (HCC)    "never had blood clots"   GERD (gastroesophageal reflux disease)    Hiatal hernia    History of kidney stones    multiple kidney stones-not a problem at present   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, pT1 pNX) no further treatment   History of panic attacks    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 SURGERY 09-21-2016 healed now   Peripheral vascular disease (HCC)    right leg - decreased pulse    Pleural effusion    Wears partial dentures    LOWER   Welders' lung (HCC)    chronic cough    Past Surgical History:  Procedure Laterality Date   ANAL FISTULECTOMY N/A 04/01/2013   Procedure: EXAM UNDER ANESTHESIA WITH ANAL FISSURectomy, sphincterotomy and internal hemorrhoidectomy;  Surgeon: Rogena Class, MD;  Location: [facility];  Service: General;  Laterality: N/A;   APPLICATION OF WOUND VAC N/A 05/16/2015   Procedure: APPLICATION OF WOUND VAC;  Surgeon: Oralee Billow, MD;  Location: [facility];  Service: General;  Laterality: N/A;   APPLICATION OF WOUND VAC N/A 05/20/2015   Procedure: EXHANGE OF ABDOMINAL  WOUND VAC DRESSING;  Surgeon: Oralee Billow, MD;  Location: [facility];  Service: General;  Laterality: N/A;   COLONOSCOPY     CYSTOSCOPY N/A 05/23/2021   Procedure: Orin Birk;  Surgeon: Trent Frizzle, MD;  Location: [facility];  Service: Urology;  Laterality: N/A;   CYSTOSCOPY WITH INSERTION OF UROLIFT N/A 10/12/2016   Procedure: CYSTOSCOPY WITH INSERTION OF UROLIFT;  Surgeon: Trent Frizzle, MD;  Location: [facility];  Service: Urology;  Laterality: N/A;   EUS N/A 12/31/2014   Procedure: UPPER ENDOSCOPIC ULTRASOUND (EUS) LINEAR;  Surgeon: Janel Medford, MD;  Location: [facility];  Service: Endoscopy;  Laterality: N/A;   HARDWARE REMOVAL Left 2013   left ankle   HEMORRHOIDECTOMY WITH HEMORRHOID BANDING     INCISION AND DRAINAGE ABSCESS N/A 05/14/2015   Procedure: DRAINAGE OF INFECTED PANCREATIC PSEUDOCYST;  Surgeon: Oralee Billow, MD;  Location: [facility];   Service: General;  Laterality: N/A;   INCISION AND DRAINAGE OF WOUND N/A 05/16/2015   Procedure: IRRIGATION AND DEBRIDEMENT WOUND;  Surgeon: Oralee Billow, MD;  Location: [facility];  Service: General;  Laterality: N/A;   INCISIONAL HERNIA REPAIR N/A 11/12/2015   Procedure: REPAIR INCISIONAL HERNIA WITH MESH;  Surgeon: Oralee Billow, MD;  Location: [facility];  Service: General;  Laterality: N/A;   INGUINAL HERNIA REPAIR Right 1974;  1982   INSERTION OF MESH N/A 11/12/2015   Procedure: INSERTION OF MESH;  Surgeon: Oralee Billow, MD;  Location: [facility];  Service: General;  Laterality: N/A;   IR GENERIC HISTORICAL  04/20/2015   IR RADIOLOGIST EVAL & MGMT 04/20/2015 [facility] INTERV RAD   IR IVC FILTER PLMT / S&I /IMG GUID/MOD SED  11/25/2020   IR PTA NON CORO-LOWER EXTREM  07/06/2022   IR RADIOLOGIST EVAL & MGMT  10/26/2021   IR RADIOLOGIST EVAL & MGMT  01/26/2022   IR RADIOLOGIST EVAL & MGMT  06/02/2022   IR RADIOLOGIST EVAL & MGMT  12/15/2022   IR RADIOLOGY PERIPHERAL GUIDED IV START  11/25/2020   IR THROMBECT VENO MECH MOD SED  10/08/2021   IR US  GUIDE VASC ACCESS LEFT  04/24/2022   IR US  GUIDE VASC ACCESS LEFT  07/06/2022   IR US  GUIDE VASC ACCESS RIGHT  11/25/2020   IR US  GUIDE VASC ACCESS RIGHT  10/08/2021   IR US  GUIDE VASC ACCESS RIGHT  07/06/2022   IR VENO/EXT/UNI RIGHT  10/12/2021   IR VENO/EXT/UNI RIGHT  04/24/2022   IR VENO/EXT/UNI RIGHT  07/06/2022   IR VENOCAVAGRAM IVC  04/24/2022   KNEE ARTHROSCOPY Bilateral right 08-08-2000;  left 11-23-2000   meniscal repair and chondroplasty's   KNEE ARTHROSCOPY  03/01/2012   Procedure: ARTHROSCOPY KNEE;  Surgeon: Aurther Blue, MD;  Location: [facility];  Service: Orthopedics;  Laterality: Left;  WITH SYNOVECTOMY    LAPAROTOMY N/A 05/14/2015   Procedure: EXPLORATORY LAPAROTOMY;  Surgeon: Oralee Billow, MD;  Location: [facility];  Service: General;  Laterality: N/A;   MASS EXCISION N/A 12/24/2019   Procedure: EXCISION LIP CARCINOMA;  Surgeon: Reynold Caves, MD;  Location:  [facility];  Service: ENT;  Laterality: N/A;   ORIF LEFT ANKLE FX  08/20/2009   distal tib-fib and malleolus   ROTATOR CUFF REPAIR Bilateral right 1994; left 1993, left 1993   TOTAL KNEE ARTHROPLASTY Right 06/02/2013   Procedure: RIGHT TOTAL KNEE ARTHROPLASTY;  Surgeon: Aurther Blue, MD;  Location: [facility];  Service: Orthopedics;  Laterality: Right;   TOTAL KNEE ARTHROPLASTY Left 01-31-2008   dr France Ina   TOTAL KNEE REVISION Right 01/23/2018   Procedure: right knee polyethylene revision;  Surgeon: Liliane Rei, MD;  Location: [facility];  Service: Orthopedics;  Laterality: Right;    TRANSURETHRAL RESECTION OF BLADDER TUMOR  N/A 05/23/2021   Procedure: TRANSURETHRAL BIOPSY OF BLADDER;  Surgeon: Trent Frizzle, MD;  Location: [facility];  Service: Urology;  Laterality: N/A;   TRANSURETHRAL RESECTION OF PROSTATE N/A 04/21/2019   Procedure: TRANSURETHRAL RESECTION OF THE PROSTATE (TURP);  Surgeon: Trent Frizzle, MD;  Location: [facility];  Service: Urology;  Laterality: N/A;   UMBILICAL HERNIA REPAIR  08-11-1999   dr Lucienne Ryder   UPPER GASTROINTESTINAL ENDOSCOPY  sept 2016   WOUND EXPLORATION N/A 09/21/2016   Procedure: WOUND EXPLORATION AND DEBRIDEMENT ABDOMINAL WALL;  Surgeon: Oralee Billow, MD;  Location: [facility];  Service: General;  Laterality: N/A;    Family History  Problem Relation Age of Onset   Bone cancer Father        Jaw   Heart disease Paternal Uncle    Diabetes Maternal Grandmother    Factor V Leiden deficiency Daughter    Colon cancer Neg Hx    Esophageal cancer Neg Hx    Stomach cancer Neg Hx    Rectal cancer Neg Hx    Migraines Neg Hx     SOCIAL HISTORY: Social History   Socioeconomic History   Marital status: Married    Spouse name: Genevia Kern   Number of children: 2   Years of education: Not on file   Highest education level: Not on file  Occupational History   Occupation: retired   Tobacco Use   Smoking status: Former    Current  packs/day: 0.00    Average packs/day: 4.0 packs/day for 17.0 years (68.0 ttl pk-yrs)    Types: Cigarettes    Start date: 02/20/1958    Quit date: 02/21/1975    Years since quitting: 48.3   Smokeless tobacco: Never  Vaping Use   Vaping status: Never Used  Substance and Sexual Activity   Alcohol use: No    Alcohol/week: 0.0 standard drinks of alcohol   Drug use: No   Sexual activity: Not Currently  Other Topics Concern   Not on file  Social History Narrative   Married, retired Therapist, art   1 son 1 daughter   2 caffeine free (will switch off once supply is finished with caffiene).       Social Drivers of Corporate investment banker Strain: Not on file  Food Insecurity: No Food Insecurity (04/22/2022)   Hunger Vital Sign    Worried About Running Out of Food in the Last Year: Never true    Ran Out of Food in the Last Year: Never true  Transportation Needs: No Transportation Needs (04/22/2022)   PRAPARE - Administrator, Civil Service (Medical): No    Lack of Transportation (Non-Medical): No  Physical Activity: Not on file  Stress: Not on file  Social Connections: Unknown (07/05/2021)   [facility]   Social Network    Social Network: Not on file  Intimate Partner Violence: Not At Risk (04/22/2022)   Humiliation, Afraid, Rape, and Kick questionnaire    Fear of Current or Ex-Partner: No    Emotionally Abused: No    Physically Abused: No    Sexually Abused: No    Allergies  Allergen Reactions   Ditropan  [Oxybutynin ] Swelling and Other (See Comments)   Quinine Other (See Comments)    Platelets dropped- consumptive coagulopathy     Vibra -Tab [Doxycycline ] Other (See Comments)    Epistaxis     Voltaren [Diclofenac] Swelling and Other (See Comments)    Elevated liver enzymes Lip  swelling    Zoloft  [Sertraline ] Other (See Comments)    Insomnia Acute urinary retention   Cymbalta  [Duloxetine  Hcl] Other (See Comments)    Night  sweats   Morphine And Codeine  Nausea And Vomiting, Swelling and Other (See Comments)    Facial and lip swelling OK to use IR form of hydrocodone  and oxycodone    Oxycontin  [Oxycodone  Hcl] Swelling and Other (See Comments)    Lip swelling OK to use IR form   Tape Other (See Comments)    Skin tears easily  SKIN IS FRAGILE- CERTAIN BANDAGES TEAR OFF THE SKIN, "BANDAIDS ARE OKAY."   Voltaren [Diclofenac Sodium] Other (See Comments)    Elevated liver enzymes   Zohydro Er [Hydrocodone  Bitartrate Er] Swelling    Facial swelling OK to use IR form   Fentanyl  Nausea And Vomiting and Other (See Comments)    Nausea, vomiting ad headaches - Per patient "okay to use nothing ever happened with it"   Neurontin  [Gabapentin ] Other (See Comments)    Unknown reaction     Current Outpatient Medications  Medication Sig Dispense Refill   acetaminophen  (TYLENOL ) 500 MG tablet Take 500 mg by mouth every 6 (six) hours as needed for mild pain or headache.     carvedilol  (COREG ) 25 MG tablet Take 1 tablet (25 mg total) by mouth every evening. 90 tablet 4   Continuous Glucose Sensor (FREESTYLE LIBRE 3 SENSOR) MISC Use as directed and change every 14 (fourteen) days. 6 each 3   diclofenac Sodium (VOLTAREN) 1 % GEL Apply topically.     diphenhydramine -acetaminophen  (TYLENOL  PM) 25-500 MG TABS tablet Take 1 tablet by mouth at bedtime as needed.     docusate sodium  (COLACE) 100 MG capsule Take 100 mg by mouth 2 (two) times daily as needed. (Patient not taking: Reported on 05/25/2023)     empagliflozin  (JARDIANCE ) 25 MG TABS tablet Take 1 tablet (25 mg total) by mouth daily. (Patient not taking: Reported on 05/25/2023) 90 tablet 1   empagliflozin  (JARDIANCE ) 25 MG TABS tablet Take 1 tablet (25 mg total) by mouth daily. 90 tablet 1   fluticasone  (FLONASE ) 50 MCG/ACT nasal spray Place 2 sprays into both nostrils daily. (Patient not taking: Reported on 05/25/2023) 16 g 5   furosemide  (LASIX ) 20 MG tablet Take 20 mg by mouth  daily as needed for edema.     Insulin  Degludec FlexTouch 100 UNIT/ML SOPN Inject 40 Units into the skin daily. (Patient taking differently: Inject 40 Units into the skin daily. On sliding scale.) 45 mL 1   Insulin  Pen Needle (PEN NEEDLES) 31G X 5 MM MISC Use 1 as directed once daily. 100 each 3   Insulin  Pen Needle 31G X 5 MM MISC Use as directed 1 time daily. 100 each 3   lansoprazole  (PREVACID ) 30 MG capsule Take 1 capsule (30 mg) by mouth once a day before a meal. 90 capsule 1   levocetirizine (XYZAL ) 5 MG tablet Take 5 mg by mouth every evening.     losartan  (COZAAR ) 50 MG tablet Take 1 tablet (50 mg total) by mouth daily. 90 tablet 4   methocarbamol  (ROBAXIN ) 500 MG tablet Take 1 tablet (500 mg total) by mouth 4 (four) times a day for 10 days. (Patient not taking: Reported on 05/25/2023) 40 tablet 0   mupirocin  ointment (BACTROBAN ) 2 % Apply 1 Application topically 2 (two) times daily for 5 days. 22 g 3   nystatin  (MYCOSTATIN ) 100000 UNIT/ML suspension Use 4-5 mL for Mouth/Throat as directed  four times a day (Patient not taking: Reported on 05/25/2023) 240 mL 3   Omega-3 Fatty Acids (FISH OIL) 1000 MG CAPS Take 1 capsule by mouth daily at 6 (six) AM.     ondansetron  (ZOFRAN -ODT) 4 MG disintegrating tablet Take 1 tablet (4 mg total) by mouth every 8 (eight) hours as needed for nausea or vomiting. (Patient not taking: Reported on 05/25/2023) 30 tablet 0   pravastatin  (PRAVACHOL ) 20 MG tablet Take 1 tablet (20 mg total) by mouth 2 (two) times a week. 40 tablet 4   ramelteon  (ROZEREM ) 8 MG tablet Take 1 tablet (8 mg total) by mouth at bedtime as needed. (Patient not taking: Reported on 05/25/2023) 30 tablet 0   tirzepatide  (MOUNJARO ) 7.5 MG/0.5ML Pen Inject 7.5 mg into the skin once a week. 6 mL 1   traMADol  (ULTRAM ) 50 MG tablet Take 1 tablet (50 mg total) by mouth 2 (two) times daily as needed. 60 tablet 1   triamcinolone  cream (KENALOG ) 0.1 % Apply 1 application topically to affected area(s) 2 (two)  times daily. 80 g 3   No current facility-administered medications for this visit.    REVIEW OF SYSTEMS:  [X]  denotes positive finding, [ ]  denotes negative finding Cardiac  Comments:  Chest pain or chest pressure: ***   Shortness of breath upon exertion:    Short of breath when lying flat:    Irregular heart rhythm:        Vascular    Pain in calf, thigh, or hip brought on by ambulation:    Pain in feet at night that wakes you up from your sleep:     Blood clot in your veins:    Leg swelling:         Pulmonary    Oxygen at home:    Productive cough:     Wheezing:         Neurologic    Sudden weakness in arms or legs:     Sudden numbness in arms or legs:     Sudden onset of difficulty speaking or slurred speech:    Temporary loss of vision in one eye:     Problems with dizziness:         Gastrointestinal    Blood in stool:     Vomited blood:         Genitourinary    Burning when urinating:     Blood in urine:        Psychiatric    Major depression:         Hematologic    Bleeding problems:    Problems with blood clotting too easily:        Skin    Rashes or ulcers:        Constitutional    Fever or chills:      PHYSICAL EXAM: There were no vitals filed for this visit.  GENERAL: The patient is a well-nourished male, in no acute distress. The vital signs are documented above. CARDIAC: There is a regular rate and rhythm.  VASCULAR: *** PULMONARY: There is good air exchange bilaterally without wheezing or rales. ABDOMEN: Soft and non-tender with normal pitched bowel sounds.  MUSCULOSKELETAL: There are no major deformities or cyanosis. NEUROLOGIC: No focal weakness or paresthesias are detected. SKIN: There are no ulcers or rashes noted. PSYCHIATRIC: The patient has a normal affect.  DATA:     Lower Venous Reflux Study  Patient Name:  Derek MAJERUS Sr.  Date of Exam:  06/06/2023 Medical Rec #: [id number]             Accession #:    [id number] Date  of Birth: 1945-05-09              Patient Gender: M Patient Age:   36 years Exam Location:  [facility] Procedure:      VAS US  LOWER EXTREMITY VENOUS REFLUX Referring Phys: Jimmye Moulds   --------------------------------------------------------------------------- -----   Indications: Edema right lower extremity.   Risk Factors: DVT History of DVT and superficial thromboisis gling back to 08/06/2020. Performing Technologist: Homer Lust RVT    Examination Guidelines: A complete evaluation includes B-mode imaging, spectral Doppler, color Doppler, and power Doppler as needed of all accessible portions of each vessel. Bilateral testing is considered an integral part of a complete examination. Limited examinations for reoccurring indications may be performed as noted. The reflux portion of the exam is performed with the patient in reverse Trendelenburg. Significant venous reflux is defined as >500 ms in the superficial venous system, and >1 second in the deep venous system.    Venous Reflux Times +--------------+---------+------+-----------+------------+----------------- ----+ RIGHT         Reflux NoRefluxReflux TimeDiameter cmsComments                                      Yes                                               +--------------+---------+------+-----------+------------+----------------- ----+ CFV                     yes   >1 second             Enlarged lymph node   +--------------+---------+------+-----------+------------+----------------- ----+ FV dist                 yes   >1 second             small contracted                                                          vein, partial                                                              compression, chronic  +--------------+---------+------+-----------+------------+----------------- ----+ Popliteal               yes   >1 second              chronic thrombus      +--------------+---------+------+-----------+------------+----------------- ----+ GSV at SFJ              yes    >500 ms      0.89                          +--------------+---------+------+-----------+------------+----------------- ----+ GSV prox  thigh          yes    >500 ms      0.69                          +--------------+---------+------+-----------+------------+----------------- ----+ GSV mid thigh           yes    >500 ms      0.62                          +--------------+---------+------+-----------+------------+----------------- ----+ GSV dist thigh          yes    >500 ms      0.56                          +--------------+---------+------+-----------+------------+----------------- ----+ GSV at knee             yes    >500 ms      0.58                          +--------------+---------+------+-----------+------------+----------------- ----+ GSV prox calf           yes    >500 ms      0.56                          +--------------+---------+------+-----------+------------+----------------- ----+ GSV mid calf            yes    >500 ms      0.52                          +--------------+---------+------+-----------+------------+----------------- ----+ GSV dist calf no                            0.26                          +--------------+---------+------+-----------+------------+----------------- ----+ SSV Pop Fossa           yes    >500 ms      0.39                          +--------------+---------+------+-----------+------------+----------------- ----+ SSV prox calf no                            0.43                          +--------------+---------+------+-----------+------------+----------------- ----+ SSV mid calf            yes    >500 ms      0.40                           +--------------+---------+------+-----------+------------+----------------- ----+      Summary: Right: - No evidence of acute deep vein thrombosis seen in the right lower extremity, from the common femoral through the popliteal veins. Small contracted vein noted throughout with chronic thrombus visualized. - Venous reflux is noted in the right sapheno-femoral junction. - Venous reflux is noted in the right greater saphenous vein in the thigh. - Venous reflux  is noted in the right greater saphenous vein in the calf. - Venous reflux is noted in the right femoral vein. - Venous reflux is noted in the right popliteal vein. - Venous reflux is noted in the right short saphenous vein.  Note: Unable to thoroughtly visualized the calf veins due to pain.   *See table(s) above for measurements and observations.  Electronically signed by Irvin Mantel on 06/07/2023 at 5:48:43 PM.      Assessment/Plan:  78 y.o. 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  He is well-known to vascular surgery.  Previously seen on with evidence of multiple lower extremity DVTs.  08/06/2020 was diagnosed with a right posterior tibial DVT.  On 08/13/2020 was diagnosed with a left posterior tibial DVT.  Subsequently on 11/07/2020 had nonocclusive DVT in the right common femoral vein.  He is followed by Dr. Maria Shiner.  I previously discussed lifetime anticoagulation.  Apparently has stopped his Pradaxa  and no longer wants to be on anticoagulation.   Young Hensen, MD [facility] Office: [phone]

## 2023-06-12 ENCOUNTER — Encounter: Payer: Self-pay | Admitting: Vascular Surgery

## 2023-06-12 ENCOUNTER — Other Ambulatory Visit (HOSPITAL_BASED_OUTPATIENT_CLINIC_OR_DEPARTMENT_OTHER): Payer: Self-pay

## 2023-06-12 ENCOUNTER — Ambulatory Visit: Admitting: Vascular Surgery

## 2023-06-12 VITALS — BP 120/75 | HR 69 | Temp 98.0°F | Resp 20 | Ht 71.0 in | Wt 235.6 lb

## 2023-06-12 DIAGNOSIS — I82401 Acute embolism and thrombosis of unspecified deep veins of right lower extremity: Secondary | ICD-10-CM | POA: Diagnosis not present

## 2023-06-12 MED ORDER — CLINDAMYCIN HCL 150 MG PO CAPS
ORAL_CAPSULE | ORAL | 0 refills | Status: AC
  Filled 2023-06-12: qty 21, 7d supply, fill #0

## 2023-06-14 ENCOUNTER — Encounter: Payer: Self-pay | Admitting: Radiology

## 2023-07-02 ENCOUNTER — Other Ambulatory Visit (HOSPITAL_BASED_OUTPATIENT_CLINIC_OR_DEPARTMENT_OTHER): Payer: Self-pay

## 2023-07-02 MED ORDER — CLINDAMYCIN HCL 150 MG PO CAPS
150.0000 mg | ORAL_CAPSULE | Freq: Three times a day (TID) | ORAL | 0 refills | Status: DC
  Filled 2023-07-02: qty 21, 7d supply, fill #0

## 2023-07-06 ENCOUNTER — Inpatient Hospital Stay: Attending: Hematology & Oncology

## 2023-07-06 ENCOUNTER — Inpatient Hospital Stay: Admitting: Hematology & Oncology

## 2023-07-06 ENCOUNTER — Encounter: Payer: Self-pay | Admitting: Hematology & Oncology

## 2023-07-06 VITALS — BP 116/64 | HR 63 | Temp 97.9°F | Resp 20 | Ht 71.0 in | Wt 241.1 lb

## 2023-07-06 DIAGNOSIS — D6851 Activated protein C resistance: Secondary | ICD-10-CM | POA: Diagnosis not present

## 2023-07-06 DIAGNOSIS — M7989 Other specified soft tissue disorders: Secondary | ICD-10-CM | POA: Diagnosis not present

## 2023-07-06 DIAGNOSIS — D75839 Thrombocytosis, unspecified: Secondary | ICD-10-CM

## 2023-07-06 DIAGNOSIS — I82411 Acute embolism and thrombosis of right femoral vein: Secondary | ICD-10-CM

## 2023-07-06 DIAGNOSIS — I82401 Acute embolism and thrombosis of unspecified deep veins of right lower extremity: Secondary | ICD-10-CM

## 2023-07-06 DIAGNOSIS — Z7902 Long term (current) use of antithrombotics/antiplatelets: Secondary | ICD-10-CM | POA: Insufficient documentation

## 2023-07-06 DIAGNOSIS — D3A8 Other benign neuroendocrine tumors: Secondary | ICD-10-CM

## 2023-07-06 DIAGNOSIS — G8929 Other chronic pain: Secondary | ICD-10-CM

## 2023-07-06 DIAGNOSIS — T40602D Poisoning by unspecified narcotics, intentional self-harm, subsequent encounter: Secondary | ICD-10-CM

## 2023-07-06 LAB — CBC WITH DIFFERENTIAL (CANCER CENTER ONLY)
Abs Immature Granulocytes: 0.02 10*3/uL (ref 0.00–0.07)
Basophils Absolute: 0.1 10*3/uL (ref 0.0–0.1)
Basophils Relative: 2 %
Eosinophils Absolute: 0.6 10*3/uL — ABNORMAL HIGH (ref 0.0–0.5)
Eosinophils Relative: 7 %
HCT: 36.9 % — ABNORMAL LOW (ref 39.0–52.0)
Hemoglobin: 12.2 g/dL — ABNORMAL LOW (ref 13.0–17.0)
Immature Granulocytes: 0 %
Lymphocytes Relative: 27 %
Lymphs Abs: 2.1 10*3/uL (ref 0.7–4.0)
MCH: 30 pg (ref 26.0–34.0)
MCHC: 33.1 g/dL (ref 30.0–36.0)
MCV: 90.9 fL (ref 80.0–100.0)
Monocytes Absolute: 1 10*3/uL (ref 0.1–1.0)
Monocytes Relative: 13 %
Neutro Abs: 3.8 10*3/uL (ref 1.7–7.7)
Neutrophils Relative %: 51 %
Platelet Count: 264 10*3/uL (ref 150–400)
RBC: 4.06 MIL/uL — ABNORMAL LOW (ref 4.22–5.81)
RDW: 15.5 % (ref 11.5–15.5)
WBC Count: 7.6 10*3/uL (ref 4.0–10.5)
nRBC: 0 % (ref 0.0–0.2)

## 2023-07-06 LAB — CMP (CANCER CENTER ONLY)
ALT: 12 U/L (ref 0–44)
AST: 16 U/L (ref 15–41)
Albumin: 4.3 g/dL (ref 3.5–5.0)
Alkaline Phosphatase: 73 U/L (ref 38–126)
Anion gap: 7 (ref 5–15)
BUN: 25 mg/dL — ABNORMAL HIGH (ref 8–23)
CO2: 24 mmol/L (ref 22–32)
Calcium: 10.5 mg/dL — ABNORMAL HIGH (ref 8.9–10.3)
Chloride: 106 mmol/L (ref 98–111)
Creatinine: 1.05 mg/dL (ref 0.61–1.24)
GFR, Estimated: >60 mL/min (ref >60)
Glucose, Bld: 192 mg/dL — ABNORMAL HIGH (ref 70–99)
Potassium: 4.3 mmol/L (ref 3.5–5.1)
Sodium: 137 mmol/L (ref 135–145)
Total Bilirubin: 0.6 mg/dL (ref 0.0–1.2)
Total Protein: 6.9 g/dL (ref 6.5–8.1)

## 2023-07-06 LAB — LACTATE DEHYDROGENASE: LDH: 123 U/L (ref 98–192)

## 2023-07-06 LAB — D-DIMER, QUANTITATIVE: D-Dimer, Quant: 0.93 ug{FEU}/mL — ABNORMAL HIGH (ref 0.00–0.50)

## 2023-07-06 NOTE — Progress Notes (Signed)
 : Hematology and Oncology Follow Up Visit  Derek [name] [id number] 1945/09/21 78 y.o. 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 Pradaxa  150 mg p.o. twice daily-start on 12/05/2022 --patient is now off Pradaxa      Interim History:  Derek [name] is back for follow-up.  The issue now is that he is try to get implants for his upper teeth.  Unfortunate, he apparently has very thin bone with the maxilla.  He has large sinuses that are a problem.  Hopefully, he will be able to get something done.  He did see vascular surgery.  They said there is no thromboembolic disease.  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  His back continues to be a huge problem for him.  There really is not much that can be done for his poor back.  He did have a CT of the abdomen pelvis on 05/28/2023.  This did not show any evidence of malignancy.  He does have a large hiatal hernia.  He has a supraumbilical fat-containing ventral hernia.  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  He does have the IVC filter. .  Otherwise, everything is about the same.  He has been off Pradaxa .  He is not on blood thinner at this point.  Swelling in the right leg.  However, when he gets up in the morning the swelling is all gone.Derek [name]  He now has a Sleep Number bed.  This really helps what he says.  He has had no fever.  He has had no obvious bleeding.  Overall, I would have said that his performance status is probably ECOG 3.      Medications:  Current Outpatient Medications:    acetaminophen  (TYLENOL ) 500 MG tablet, Take 500 mg by mouth every 6 (six) hours as needed for mild pain or headache., Disp: , Rfl:     carvedilol  (COREG ) 25 MG tablet, Take 1 tablet (25 mg total) by mouth every evening., Disp: 90 tablet, Rfl: 4   clindamycin  (CLEOCIN ) 150 MG capsule, Take 2 capsules (300 mg) by mouth now then take 1 capsule (150 mg) 3 times daily until gone., Disp: 21 capsule, Rfl: 0   Continuous Glucose Sensor (FREESTYLE LIBRE 3 SENSOR) MISC, Use as directed and change every 14 (fourteen) days., Disp: 6 each, Rfl: 3   diclofenac Sodium (VOLTAREN) 1 % GEL, Apply topically., Disp: , Rfl:    diphenhydramine -acetaminophen  (TYLENOL  PM) 25-500 MG TABS tablet, Take 1 tablet by mouth at bedtime as needed., Disp: , Rfl:    docusate sodium  (COLACE) 100 MG capsule, Take 100 mg by mouth 2 (two) times daily as needed., Disp: , Rfl:    empagliflozin  (JARDIANCE ) 25 MG TABS tablet, Take 1 tablet (25 mg total) by mouth daily., Disp: 90 tablet, Rfl: 1   fluticasone  (FLONASE ) 50 MCG/ACT nasal spray, Place 2 sprays into both nostrils daily., Disp: 16 g, Rfl: 5   furosemide  (LASIX ) 20 MG tablet, Take 20 mg by mouth daily as needed for edema., Disp: , Rfl:    Insulin  Degludec FlexTouch 100 UNIT/ML SOPN, Inject 40 Units into the skin daily. (Patient taking differently: Inject 40 Units into the skin  daily. On sliding scale.), Disp: 45 mL, Rfl: 1   Insulin  Pen Needle (PEN NEEDLES) 31G X 5 MM MISC, Use 1 as directed once daily., Disp: 100 each, Rfl: 3   Insulin  Pen Needle 31G X 5 MM MISC, Use as directed 1 time daily., Disp: 100 each, Rfl: 3   lansoprazole  (PREVACID ) 30 MG capsule, Take 1 capsule (30 mg) by mouth once a day before a meal., Disp: 90 capsule, Rfl: 1   levocetirizine (XYZAL ) 5 MG tablet, Take 5 mg by mouth every evening., Disp: , Rfl:    losartan  (COZAAR ) 50 MG tablet, Take 1 tablet (50 mg total) by mouth daily., Disp: 90 tablet, Rfl: 4   methocarbamol  (ROBAXIN ) 500 MG tablet, Take 1 tablet (500 mg total) by mouth 4 (four) times a day for 10 days., Disp: 40 tablet, Rfl: 0   mupirocin  ointment (BACTROBAN ) 2 %, Apply 1  Application topically 2 (two) times daily for 5 days., Disp: 22 g, Rfl: 3   nystatin  (MYCOSTATIN ) 100000 UNIT/ML suspension, Use 4-5 mL for Mouth/Throat as directed four times a day, Disp: 240 mL, Rfl: 3   Omega-3 Fatty Acids (FISH OIL) 1000 MG CAPS, Take 1 capsule by mouth daily at 6 (six) AM., Disp: , Rfl:    ondansetron  (ZOFRAN -ODT) 4 MG disintegrating tablet, Take 1 tablet (4 mg total) by mouth every 8 (eight) hours as needed for nausea or vomiting., Disp: 30 tablet, Rfl: 0   pravastatin  (PRAVACHOL ) 20 MG tablet, Take 1 tablet (20 mg total) by mouth 2 (two) times a week., Disp: 40 tablet, Rfl: 4   ramelteon  (ROZEREM ) 8 MG tablet, Take 1 tablet (8 mg total) by mouth at bedtime as needed., Disp: 30 tablet, Rfl: 0   tirzepatide  (MOUNJARO ) 7.5 MG/0.5ML Pen, Inject 7.5 mg into the skin once a week., Disp: 6 mL, Rfl: 1   traMADol  (ULTRAM ) 50 MG tablet, Take 1 tablet (50 mg total) by mouth 2 (two) times daily as needed., Disp: 60 tablet, Rfl: 1   triamcinolone  cream (KENALOG ) 0.1 %, Apply 1 application topically to affected area(s) 2 (two) times daily., Disp: 80 g, Rfl: 3  Allergies:  Allergies  Allergen Reactions   Ditropan  [Oxybutynin ] Swelling and Other (See Comments)   Quinine Other (See Comments)    Platelets dropped- consumptive coagulopathy     Vibra -Tab [Doxycycline ] Other (See Comments)    Epistaxis     Voltaren [Diclofenac] Swelling and Other (See Comments)    Elevated liver enzymes Lip swelling    Zoloft  [Sertraline ] Other (See Comments)    Insomnia Acute urinary retention   Cymbalta  [Duloxetine  Hcl] Other (See Comments)    Night sweats   Fentanyl  Nausea And Vomiting and Other (See Comments)    headaches - Per patient "okay to use nothing ever happened with it"   Morphine And Codeine  Nausea And Vomiting, Swelling and Other (See Comments)    Facial and lip swelling OK to use IR form of hydrocodone  and oxycodone    Oxycontin  [Oxycodone  Hcl] Swelling and Other (See Comments)     Lip swelling OK to use IR form   Tape Other (See Comments)    Skin tears easily  SKIN IS FRAGILE- CERTAIN BANDAGES TEAR OFF THE SKIN, "BANDAIDS ARE OKAY."   Voltaren [Diclofenac Sodium] Other (See Comments)    Elevated liver enzymes   Zohydro Er [Hydrocodone  Bitartrate Er] Swelling    Facial swelling OK to use IR form   Neurontin  [Gabapentin ] Other (See Comments)    Unknown reaction  Past Medical History, Surgical history, Social history, and Family History were reviewed and updated.  Review of Systems: Review of Systems  Constitutional:  Positive for fatigue.  HENT:  Negative.    Eyes: Negative.   Respiratory: Negative.    Cardiovascular:  Positive for leg swelling.  Gastrointestinal: Negative.   Endocrine: Negative.   Genitourinary:  Positive for hematuria.   Musculoskeletal:  Positive for back pain, myalgias and neck pain.  Neurological:  Positive for dizziness and headaches.  Hematological: Negative.   Psychiatric/Behavioral: Negative.      Physical Exam: Vital signs are temperature of 97.9.  Pulse 63.  Blood pressure 116/64.  Weight is 241 pounds. .   Wt Readings from Last 3 Encounters:  07/06/23 241 lb 1.9 oz (109.4 kg)  06/12/23 235 lb 9.6 oz (106.9 kg)  05/25/23 239 lb (108.4 kg)    Physical Exam Vitals reviewed.  HENT:     Head: Normocephalic and atraumatic.  Eyes:     Pupils: Pupils are equal, round, and reactive to light.  Cardiovascular:     Rate and Rhythm: Normal rate and regular rhythm.     Heart sounds: Normal heart sounds.  Pulmonary:     Effort: Pulmonary effort is normal.     Breath sounds: Normal breath sounds.  Abdominal:     General: Bowel sounds are normal.     Palpations: Abdomen is soft.     Comments: His abdomen is obese.  He has a laparotomy wound.  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  He may have little bit of fullness and firmness.  I am not impressed with the actual abdominal mass.  His liver is not  palpable.  Musculoskeletal:        General: No tenderness or deformity. Normal range of motion.     Cervical back: Normal range of motion.     Comments: His extremities does show some mild swelling in the right leg.  It is not as firm.  He has good warmth to the leg.  He has palpable pulses in the distal extremity.    Lymphadenopathy:     Cervical: No cervical adenopathy.  Skin:    General: Skin is warm and dry.     Findings: No erythema or rash.  Neurological:     Mental Status: He is alert and oriented to person, place, and time.  Psychiatric:        Behavior: Behavior normal.        Thought Content: Thought content normal.        Judgment: Judgment normal.      Lab Results  Component Value Date   WBC 7.6 07/06/2023   HGB 12.2 (L) 07/06/2023   HCT 36.9 (L) 07/06/2023   MCV 90.9 07/06/2023   PLT 264 07/06/2023     Chemistry      Component Value Date/Time   NA 137 07/06/2023 1452   K 4.3 07/06/2023 1452   CL 106 07/06/2023 1452   CO2 24 07/06/2023 1452   BUN 25 (H) 07/06/2023 1452   CREATININE 1.05 07/06/2023 1452      Component Value Date/Time   CALCIUM  10.5 (H) 07/06/2023 1452   ALKPHOS 73 07/06/2023 1452   AST 16 07/06/2023 1452   ALT 12 07/06/2023 1452   BILITOT 0.6 07/06/2023 1452       Impression and Plan: Mr. Rempe is a very nice 78 year old white male.  He has had incredible history.  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  He apparently was hospitalized for 4 months because of complications.  However, this is not going to be a problem for him in my opinion.  His back continues to be a zone 1 problem.  I am not sure what else can be done for his poor back.  He wants to be off anticoagulation.  Hopefully, this will not be a problem for him.  We will plan to get him back to see us  in about a 6 weeks.  Hopefully, he will be able to have the implants for his teeth.  Ivor Mars, MD 5/16/20253:50  PM

## 2023-07-17 ENCOUNTER — Other Ambulatory Visit (HOSPITAL_BASED_OUTPATIENT_CLINIC_OR_DEPARTMENT_OTHER): Payer: Self-pay

## 2023-07-17 MED ORDER — CLINDAMYCIN HCL 150 MG PO CAPS
ORAL_CAPSULE | ORAL | 0 refills | Status: DC
  Filled 2023-07-17: qty 21, 8d supply, fill #0

## 2023-07-24 ENCOUNTER — Other Ambulatory Visit (HOSPITAL_BASED_OUTPATIENT_CLINIC_OR_DEPARTMENT_OTHER): Payer: Self-pay

## 2023-07-24 MED ORDER — CLINDAMYCIN HCL 150 MG PO CAPS
150.0000 mg | ORAL_CAPSULE | Freq: Three times a day (TID) | ORAL | 0 refills | Status: AC
  Filled 2023-07-24: qty 9, 3d supply, fill #0

## 2023-07-25 ENCOUNTER — Other Ambulatory Visit (HOSPITAL_BASED_OUTPATIENT_CLINIC_OR_DEPARTMENT_OTHER): Payer: Self-pay

## 2023-07-25 DIAGNOSIS — L309 Dermatitis, unspecified: Secondary | ICD-10-CM | POA: Diagnosis not present

## 2023-07-25 DIAGNOSIS — K219 Gastro-esophageal reflux disease without esophagitis: Secondary | ICD-10-CM | POA: Diagnosis not present

## 2023-07-25 DIAGNOSIS — M1990 Unspecified osteoarthritis, unspecified site: Secondary | ICD-10-CM | POA: Diagnosis not present

## 2023-07-25 DIAGNOSIS — Z6833 Body mass index (BMI) 33.0-33.9, adult: Secondary | ICD-10-CM | POA: Diagnosis not present

## 2023-07-25 DIAGNOSIS — G8929 Other chronic pain: Secondary | ICD-10-CM | POA: Diagnosis not present

## 2023-07-25 DIAGNOSIS — I10 Essential (primary) hypertension: Secondary | ICD-10-CM | POA: Diagnosis not present

## 2023-07-25 DIAGNOSIS — Z8669 Personal history of other diseases of the nervous system and sense organs: Secondary | ICD-10-CM | POA: Diagnosis not present

## 2023-07-25 DIAGNOSIS — K2930 Chronic superficial gastritis without bleeding: Secondary | ICD-10-CM | POA: Diagnosis not present

## 2023-07-25 DIAGNOSIS — Z0000 Encounter for general adult medical examination without abnormal findings: Secondary | ICD-10-CM | POA: Diagnosis not present

## 2023-07-25 MED ORDER — TRAMADOL HCL 50 MG PO TABS
50.0000 mg | ORAL_TABLET | Freq: Two times a day (BID) | ORAL | 5 refills | Status: DC | PRN
  Filled 2023-07-25: qty 60, 30d supply, fill #0
  Filled 2023-09-12: qty 60, 30d supply, fill #1
  Filled 2023-12-02: qty 60, 30d supply, fill #2
  Filled 2024-01-01: qty 60, 30d supply, fill #3

## 2023-07-31 ENCOUNTER — Other Ambulatory Visit (HOSPITAL_BASED_OUTPATIENT_CLINIC_OR_DEPARTMENT_OTHER): Payer: Self-pay

## 2023-08-06 ENCOUNTER — Other Ambulatory Visit (HOSPITAL_BASED_OUTPATIENT_CLINIC_OR_DEPARTMENT_OTHER): Payer: Self-pay | Admitting: Family Medicine

## 2023-08-06 DIAGNOSIS — R1031 Right lower quadrant pain: Secondary | ICD-10-CM

## 2023-08-06 DIAGNOSIS — K5900 Constipation, unspecified: Secondary | ICD-10-CM | POA: Diagnosis not present

## 2023-08-07 ENCOUNTER — Other Ambulatory Visit (HOSPITAL_BASED_OUTPATIENT_CLINIC_OR_DEPARTMENT_OTHER): Payer: Self-pay | Admitting: Family Medicine

## 2023-08-07 DIAGNOSIS — R1031 Right lower quadrant pain: Secondary | ICD-10-CM

## 2023-08-08 ENCOUNTER — Ambulatory Visit (HOSPITAL_BASED_OUTPATIENT_CLINIC_OR_DEPARTMENT_OTHER)
Admission: RE | Admit: 2023-08-08 | Discharge: 2023-08-08 | Disposition: A | Discharge status: Home / Self Care | Source: Ambulatory Visit | Attending: Family Medicine | Admitting: Family Medicine

## 2023-08-08 DIAGNOSIS — K439 Ventral hernia without obstruction or gangrene: Secondary | ICD-10-CM | POA: Diagnosis not present

## 2023-08-08 DIAGNOSIS — R1031 Right lower quadrant pain: Secondary | ICD-10-CM | POA: Diagnosis not present

## 2023-08-16 ENCOUNTER — Encounter: Payer: Self-pay | Admitting: Hematology & Oncology

## 2023-08-16 ENCOUNTER — Other Ambulatory Visit: Payer: Self-pay | Admitting: Family Medicine

## 2023-08-16 ENCOUNTER — Inpatient Hospital Stay (HOSPITAL_BASED_OUTPATIENT_CLINIC_OR_DEPARTMENT_OTHER): Admitting: Hematology & Oncology

## 2023-08-16 ENCOUNTER — Inpatient Hospital Stay: Attending: Hematology & Oncology

## 2023-08-16 ENCOUNTER — Other Ambulatory Visit: Payer: Self-pay

## 2023-08-16 VITALS — BP 123/58 | HR 57 | Temp 98.6°F | Resp 18 | Ht 71.0 in | Wt 235.0 lb

## 2023-08-16 DIAGNOSIS — Z79899 Other long term (current) drug therapy: Secondary | ICD-10-CM | POA: Diagnosis not present

## 2023-08-16 DIAGNOSIS — D6851 Activated protein C resistance: Secondary | ICD-10-CM | POA: Diagnosis not present

## 2023-08-16 DIAGNOSIS — D3A8 Other benign neuroendocrine tumors: Secondary | ICD-10-CM | POA: Diagnosis not present

## 2023-08-16 DIAGNOSIS — I82411 Acute embolism and thrombosis of right femoral vein: Secondary | ICD-10-CM

## 2023-08-16 DIAGNOSIS — Z7902 Long term (current) use of antithrombotics/antiplatelets: Secondary | ICD-10-CM | POA: Insufficient documentation

## 2023-08-16 DIAGNOSIS — R1030 Lower abdominal pain, unspecified: Secondary | ICD-10-CM

## 2023-08-16 DIAGNOSIS — E119 Type 2 diabetes mellitus without complications: Secondary | ICD-10-CM | POA: Diagnosis not present

## 2023-08-16 DIAGNOSIS — R109 Unspecified abdominal pain: Secondary | ICD-10-CM | POA: Diagnosis not present

## 2023-08-16 DIAGNOSIS — I82401 Acute embolism and thrombosis of unspecified deep veins of right lower extremity: Secondary | ICD-10-CM

## 2023-08-16 LAB — CBC WITH DIFFERENTIAL (CANCER CENTER ONLY)
Abs Immature Granulocytes: 0.03 10*3/uL (ref 0.00–0.07)
Basophils Absolute: 0.1 10*3/uL (ref 0.0–0.1)
Basophils Relative: 1 %
Eosinophils Absolute: 0.5 10*3/uL (ref 0.0–0.5)
Eosinophils Relative: 6 %
HCT: 41.8 % (ref 39.0–52.0)
Hemoglobin: 14 g/dL (ref 13.0–17.0)
Immature Granulocytes: 0 %
Lymphocytes Relative: 37 %
Lymphs Abs: 2.8 10*3/uL (ref 0.7–4.0)
MCH: 29.6 pg (ref 26.0–34.0)
MCHC: 33.5 g/dL (ref 30.0–36.0)
MCV: 88.4 fL (ref 80.0–100.0)
Monocytes Absolute: 0.9 10*3/uL (ref 0.1–1.0)
Monocytes Relative: 11 %
Neutro Abs: 3.4 10*3/uL (ref 1.7–7.7)
Neutrophils Relative %: 45 %
Platelet Count: 372 10*3/uL (ref 150–400)
RBC: 4.73 MIL/uL (ref 4.22–5.81)
RDW: 13.6 % (ref 11.5–15.5)
WBC Count: 7.7 10*3/uL (ref 4.0–10.5)
nRBC: 0 % (ref 0.0–0.2)

## 2023-08-16 LAB — CMP (CANCER CENTER ONLY)
ALT: 11 U/L (ref 0–44)
AST: 9 U/L — ABNORMAL LOW (ref 15–41)
Albumin: 4.1 g/dL (ref 3.5–5.0)
Alkaline Phosphatase: 83 U/L (ref 38–126)
Anion gap: 7 (ref 5–15)
BUN: 22 mg/dL (ref 8–23)
CO2: 22 mmol/L (ref 22–32)
Calcium: 10.3 mg/dL (ref 8.9–10.3)
Chloride: 106 mmol/L (ref 98–111)
Creatinine: 0.96 mg/dL (ref 0.61–1.24)
GFR, Estimated: >60 mL/min (ref >60)
Glucose, Bld: 199 mg/dL — ABNORMAL HIGH (ref 70–99)
Potassium: 4 mmol/L (ref 3.5–5.1)
Sodium: 135 mmol/L (ref 135–145)
Total Bilirubin: 0.4 mg/dL (ref 0.0–1.2)
Total Protein: 6.6 g/dL (ref 6.5–8.1)

## 2023-08-16 LAB — D-DIMER, QUANTITATIVE: D-Dimer, Quant: 0.58 ug{FEU}/mL — ABNORMAL HIGH (ref 0.00–0.50)

## 2023-08-16 NOTE — Progress Notes (Signed)
 : Hematology and Oncology Follow Up Visit  Fraser Busche [id number] 03/31/1945 78 y.o. 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 Pradaxa  150 mg p.o. twice daily-start on 12/05/2022 --patient is now off Pradaxa      Interim History:  Mr. Lech is back for follow-up.  We last saw him back in May.  Since then, his problem has been abdominal pain.  He apparently felt a tear on the right side of his abdomen.  He does have a mesh from where he he had this put in because of hernia repair.  He is worried that he may have torn the mesh.  He apparently is scheduled for a CT scan in early July.  He also was having problems with implants for his teeth.  Apparently, he is having some issues with healing from the implants.  I think he does has so much inflammation in his body that it is difficult for anything to really heal properly.  Thankfully, it does not look like he has any issues with his legs.  His legs are not always swollen.  He has been off Pradaxa  for quite a while.  He has had no fever.  He has had no cough.  His appetite is okay.  He has had no rashes.  He has had no obvious change in bowel or bladder habits.  As always, his back bothers him immensely.  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  Currently, I would have said that his performance status is ECOG 2.   Medications:  Current Outpatient Medications:    acetaminophen  (TYLENOL ) 500 MG tablet, Take 500 mg by mouth every 6 (six) hours as needed for mild pain or headache., Disp: , Rfl:    carvedilol  (COREG ) 25 MG tablet, Take 1 tablet (25 mg total) by mouth every evening., Disp: 90 tablet, Rfl: 4   clindamycin  (CLEOCIN ) 150 MG capsule, Take 1 capsule (150  mg total) by mouth 3 (three) times daily until finished., Disp: 9 capsule, Rfl: 0   Continuous Glucose Sensor (FREESTYLE LIBRE 3 SENSOR) MISC, Use as directed and change every 14 (fourteen) days., Disp: 6 each, Rfl: 3   diclofenac Sodium (VOLTAREN) 1 % GEL, Apply topically., Disp: , Rfl:    diphenhydramine -acetaminophen  (TYLENOL  PM) 25-500 MG TABS tablet, Take 1 tablet by mouth at bedtime as needed., Disp: , Rfl:    docusate sodium  (COLACE) 100 MG capsule, Take 100 mg by mouth 2 (two) times daily as needed., Disp: , Rfl:    empagliflozin  (JARDIANCE ) 25 MG TABS tablet, Take 1 tablet (25 mg total) by mouth daily., Disp: 90 tablet, Rfl: 1   fluticasone  (FLONASE ) 50 MCG/ACT nasal spray, Place 2 sprays into both nostrils daily., Disp: 16 g, Rfl: 5   furosemide  (LASIX ) 20 MG tablet, Take 20 mg by mouth daily as needed for edema., Disp: , Rfl:    Insulin  Degludec FlexTouch 100 UNIT/ML SOPN, Inject 40 Units into the skin daily. (Patient taking differently: Inject 40 Units into the skin daily. On sliding scale.), Disp: 45 mL, Rfl: 1   Insulin  Pen Needle (PEN NEEDLES) 31G X 5 MM MISC, Use 1 as directed once daily., Disp: 100 each, Rfl: 3  Insulin  Pen Needle 31G X 5 MM MISC, Use as directed 1 time daily., Disp: 100 each, Rfl: 3   lansoprazole  (PREVACID ) 30 MG capsule, Take 1 capsule (30 mg) by mouth once a day before a meal., Disp: 90 capsule, Rfl: 1   levocetirizine (XYZAL ) 5 MG tablet, Take 5 mg by mouth every evening., Disp: , Rfl:    losartan  (COZAAR ) 50 MG tablet, Take 1 tablet (50 mg total) by mouth daily., Disp: 90 tablet, Rfl: 4   methocarbamol  (ROBAXIN ) 500 MG tablet, Take 1 tablet (500 mg total) by mouth 4 (four) times a day for 10 days., Disp: 40 tablet, Rfl: 0   mupirocin  ointment (BACTROBAN ) 2 %, Apply 1 Application topically 2 (two) times daily for 5 days., Disp: 22 g, Rfl: 3   nystatin  (MYCOSTATIN ) 100000 UNIT/ML suspension, Use 4-5 mL for Mouth/Throat as directed four times a day, Disp: 240 mL,  Rfl: 3   Omega-3 Fatty Acids (FISH OIL) 1000 MG CAPS, Take 1 capsule by mouth daily at 6 (six) AM., Disp: , Rfl:    ondansetron  (ZOFRAN -ODT) 4 MG disintegrating tablet, Take 1 tablet (4 mg total) by mouth every 8 (eight) hours as needed for nausea or vomiting., Disp: 30 tablet, Rfl: 0   pravastatin  (PRAVACHOL ) 20 MG tablet, Take 1 tablet (20 mg total) by mouth 2 (two) times a week., Disp: 40 tablet, Rfl: 4   ramelteon  (ROZEREM ) 8 MG tablet, Take 1 tablet (8 mg total) by mouth at bedtime as needed., Disp: 30 tablet, Rfl: 0   tirzepatide  (MOUNJARO ) 7.5 MG/0.5ML Pen, Inject 7.5 mg into the skin once a week., Disp: 6 mL, Rfl: 1   traMADol  (ULTRAM ) 50 MG tablet, Take 1 tablet (50 mg total) by mouth 2 (two) times daily as needed., Disp: 60 tablet, Rfl: 5   triamcinolone  cream (KENALOG ) 0.1 %, Apply 1 application topically to affected area(s) 2 (two) times daily., Disp: 80 g, Rfl: 3  Allergies:  Allergies  Allergen Reactions   Ditropan  [Oxybutynin ] Swelling and Other (See Comments)   Quinine Other (See Comments)    Platelets dropped- consumptive coagulopathy     Vibra -Tab [Doxycycline ] Other (See Comments)    Epistaxis     Voltaren [Diclofenac] Swelling and Other (See Comments)    Elevated liver enzymes Lip swelling    Zoloft  [Sertraline ] Other (See Comments)    Insomnia Acute urinary retention   Cymbalta  [Duloxetine  Hcl] Other (See Comments)    Night sweats   Fentanyl  Nausea And Vomiting and Other (See Comments)    headaches - Per patient okay to use nothing ever happened with it   Morphine And Codeine  Nausea And Vomiting, Swelling and Other (See Comments)    Facial and lip swelling OK to use IR form of hydrocodone  and oxycodone    Oxycontin  [Oxycodone  Hcl] Swelling and Other (See Comments)    Lip swelling OK to use IR form   Tape Other (See Comments)    Skin tears easily  SKIN IS FRAGILE- CERTAIN BANDAGES TEAR OFF THE SKIN, BANDAIDS ARE OKAY.   Voltaren [Diclofenac Sodium]  Other (See Comments)    Elevated liver enzymes   Zohydro Er [Hydrocodone  Bitartrate Er] Swelling    Facial swelling OK to use IR form   Neurontin  [Gabapentin ] Other (See Comments)    Unknown reaction     Past Medical History, Surgical history, Social history, and Family History were reviewed and updated.  Review of Systems: Review of Systems  Constitutional:  Positive for fatigue.  HENT:  Negative.  Eyes: Negative.   Respiratory: Negative.    Cardiovascular:  Positive for leg swelling.  Gastrointestinal: Negative.   Endocrine: Negative.   Genitourinary:  Positive for hematuria.   Musculoskeletal:  Positive for back pain, myalgias and neck pain.  Neurological:  Positive for dizziness and headaches.  Hematological: Negative.   Psychiatric/Behavioral: Negative.      Physical Exam:  Vital signs are temperature of 98.6.  Pulse 57.  Blood pressure 123/58.  Weight is 235 pounds.  .   Wt Readings from Last 3 Encounters:  08/16/23 235 lb (106.6 kg)  07/06/23 241 lb 1.9 oz (109.4 kg)  06/12/23 235 lb 9.6 oz (106.9 kg)    Physical Exam Vitals reviewed.  HENT:     Head: Normocephalic and atraumatic.   Eyes:     Pupils: Pupils are equal, round, and reactive to light.    Cardiovascular:     Rate and Rhythm: Normal rate and regular rhythm.     Heart sounds: Normal heart sounds.  Pulmonary:     Effort: Pulmonary effort is normal.     Breath sounds: Normal breath sounds.  Abdominal:     General: Bowel sounds are normal.     Palpations: Abdomen is soft.     Comments: His abdomen is obese.  He has a laparotomy wound.  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  He may have little bit of fullness and firmness.  I am not impressed with the actual abdominal mass.  His liver is not palpable.   Musculoskeletal:        General: No tenderness or deformity. Normal range of motion.     Cervical back: Normal range of motion.     Comments: His extremities does show some  mild swelling in the right leg.  It is not as firm.  He has good warmth to the leg.  He has palpable pulses in the distal extremity.    Lymphadenopathy:     Cervical: No cervical adenopathy.   Skin:    General: Skin is warm and dry.     Findings: No erythema or rash.   Neurological:     Mental Status: He is alert and oriented to person, place, and time.   Psychiatric:        Behavior: Behavior normal.        Thought Content: Thought content normal.        Judgment: Judgment normal.     Lab Results  Component Value Date   WBC 7.7 08/16/2023   HGB 14.0 08/16/2023   HCT 41.8 08/16/2023   MCV 88.4 08/16/2023   PLT 372 08/16/2023     Chemistry      Component Value Date/Time   NA 135 08/16/2023 1530   K 4.0 08/16/2023 1530   CL 106 08/16/2023 1530   CO2 22 08/16/2023 1530   BUN 22 08/16/2023 1530   CREATININE 0.96 08/16/2023 1530      Component Value Date/Time   CALCIUM  10.3 08/16/2023 1530   ALKPHOS 83 08/16/2023 1530   AST 9 (L) 08/16/2023 1530   ALT 11 08/16/2023 1530   BILITOT 0.4 08/16/2023 1530       Impression and Plan: Mr. Stroupe is a very nice 78 year old white male.  He has had incredible history.  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  He apparently was hospitalized for 4 months because of complications.  However, this  is not going to be a problem for him in my opinion.  I had the fact that he has his abdominal pain now.  We will have to see what the CAT scan shows.  Also had the fact that he is having problems with the implants.  He is not able to eat that much because the implants cannot be placed and healed properly.  At this point in time, I think we can probably get him through most of Summer.  He has so many other issues that are really not hematologic.  Hopefully they will be addressed.    Maude JONELLE Crease, MD 6/26/20254:30 PM

## 2023-08-28 ENCOUNTER — Ambulatory Visit
Admission: RE | Admit: 2023-08-28 | Discharge: 2023-08-28 | Disposition: A | Discharge status: Home / Self Care | Source: Ambulatory Visit | Attending: Family Medicine | Admitting: Family Medicine

## 2023-08-28 DIAGNOSIS — K5730 Diverticulosis of large intestine without perforation or abscess without bleeding: Secondary | ICD-10-CM | POA: Diagnosis not present

## 2023-08-28 DIAGNOSIS — R1030 Lower abdominal pain, unspecified: Secondary | ICD-10-CM | POA: Diagnosis not present

## 2023-08-28 DIAGNOSIS — K449 Diaphragmatic hernia without obstruction or gangrene: Secondary | ICD-10-CM | POA: Diagnosis not present

## 2023-09-04 DIAGNOSIS — N401 Enlarged prostate with lower urinary tract symptoms: Secondary | ICD-10-CM | POA: Diagnosis not present

## 2023-09-04 DIAGNOSIS — R3914 Feeling of incomplete bladder emptying: Secondary | ICD-10-CM | POA: Diagnosis not present

## 2023-09-04 DIAGNOSIS — R3911 Hesitancy of micturition: Secondary | ICD-10-CM | POA: Diagnosis not present

## 2023-09-05 ENCOUNTER — Other Ambulatory Visit (HOSPITAL_BASED_OUTPATIENT_CLINIC_OR_DEPARTMENT_OTHER): Payer: Self-pay

## 2023-09-06 ENCOUNTER — Other Ambulatory Visit (HOSPITAL_BASED_OUTPATIENT_CLINIC_OR_DEPARTMENT_OTHER): Payer: Self-pay

## 2023-09-06 MED ORDER — LOSARTAN POTASSIUM 50 MG PO TABS
50.0000 mg | ORAL_TABLET | Freq: Every day | ORAL | 1 refills | Status: DC
  Filled 2023-09-06: qty 90, 90d supply, fill #0
  Filled 2023-12-05: qty 90, 90d supply, fill #1

## 2023-09-06 MED ORDER — CARVEDILOL 25 MG PO TABS
25.0000 mg | ORAL_TABLET | Freq: Every evening | ORAL | 1 refills | Status: DC
  Filled 2023-09-06: qty 90, 90d supply, fill #0
  Filled 2023-12-05: qty 90, 90d supply, fill #1

## 2023-09-12 ENCOUNTER — Other Ambulatory Visit: Payer: Self-pay

## 2023-09-12 ENCOUNTER — Other Ambulatory Visit (HOSPITAL_BASED_OUTPATIENT_CLINIC_OR_DEPARTMENT_OTHER): Payer: Self-pay

## 2023-09-17 ENCOUNTER — Other Ambulatory Visit: Payer: Self-pay

## 2023-09-17 ENCOUNTER — Other Ambulatory Visit (HOSPITAL_BASED_OUTPATIENT_CLINIC_OR_DEPARTMENT_OTHER): Payer: Self-pay

## 2023-09-26 IMAGING — XA IR IVC FILTER PLMT / S&I /IMG GUID/MOD SED
2 series · 10 of 10 positions shown · non-contrast
Comparison: none

INDICATION: 75-year-old male with recurrent DVT despite anticoagulation.
Peripheral IV access could not be obtained. Therefore,
ultrasound-guided deep vein IV placement was performed by the SEN[facility].

[Series 1: processed: ir ivc filter plmt / s&i /img · 2 acquisitions, 8 frames shown]
[im 1/2]
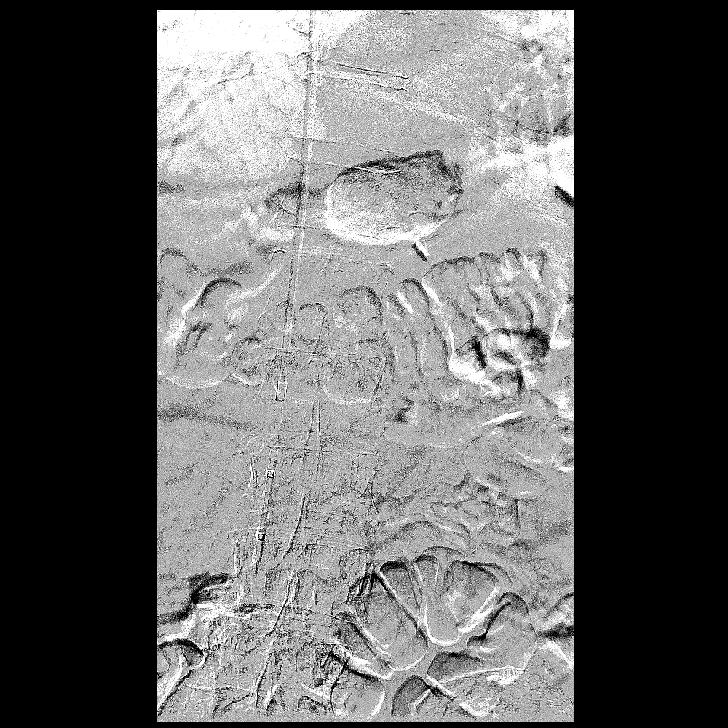
[im 1/2]
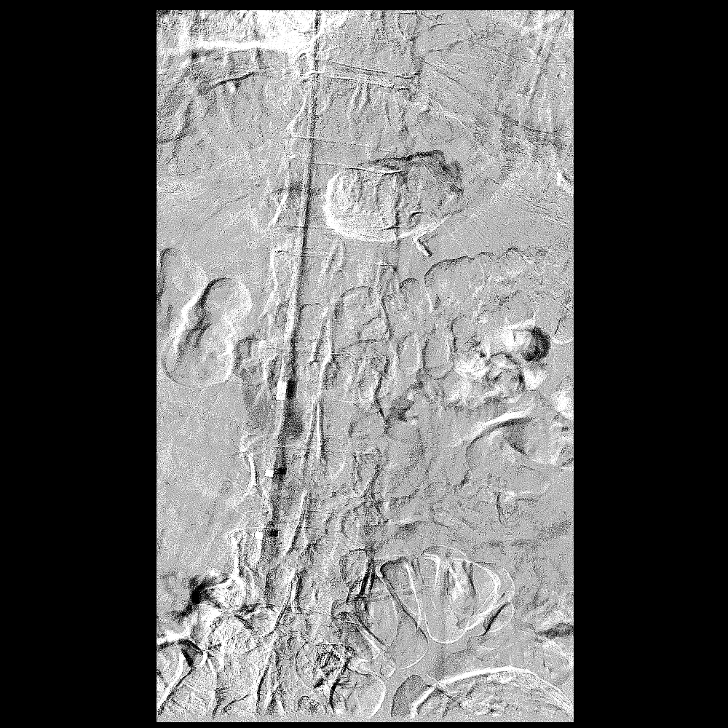
[im 1/2]
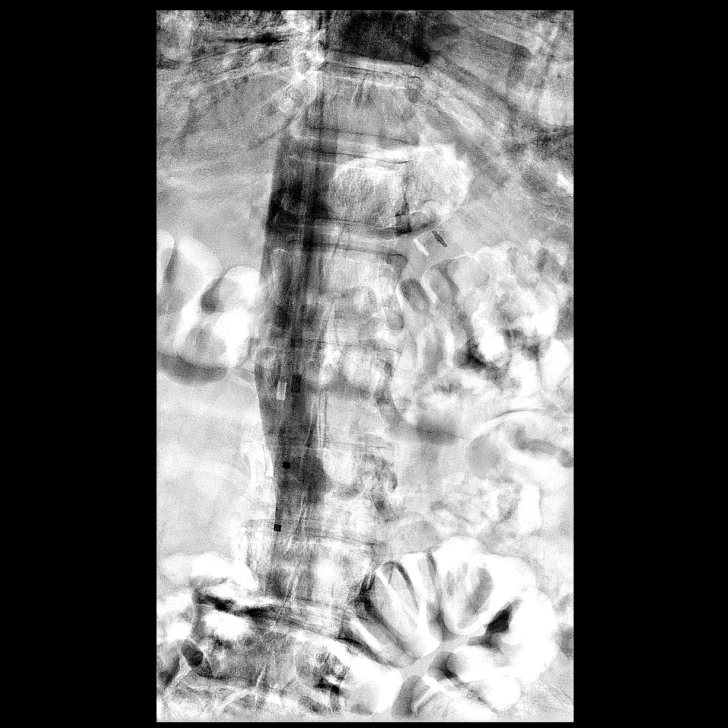
[im 1/2]
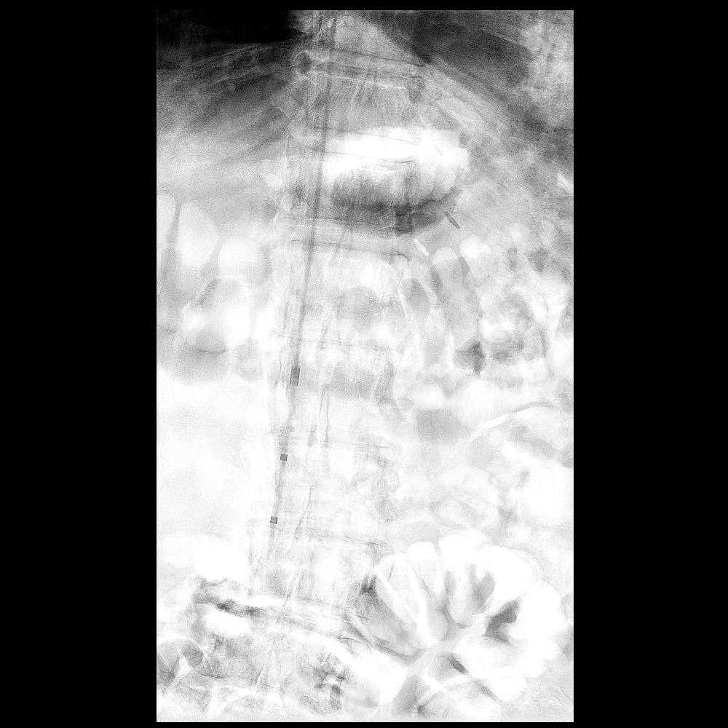
[im 2/2]
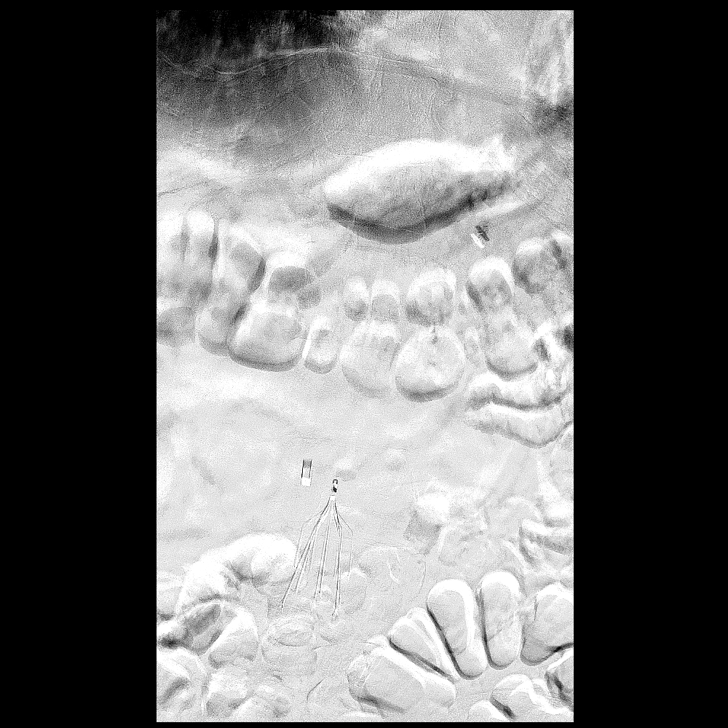
[im 2/2]
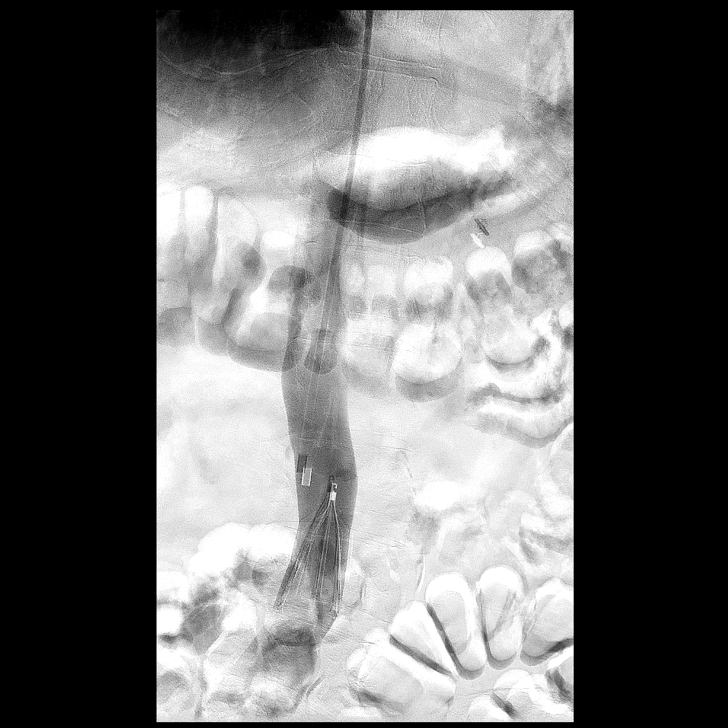
[im 2/2]
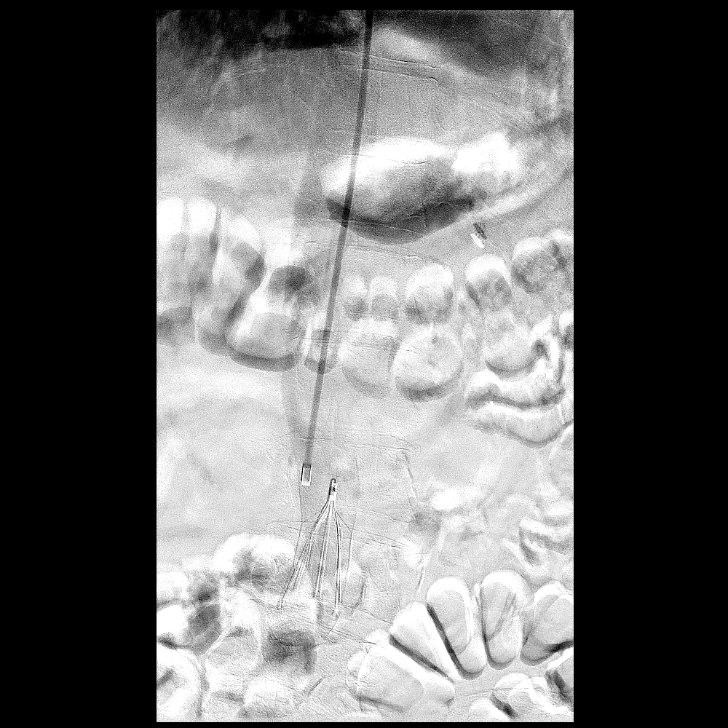
[im 2/2]
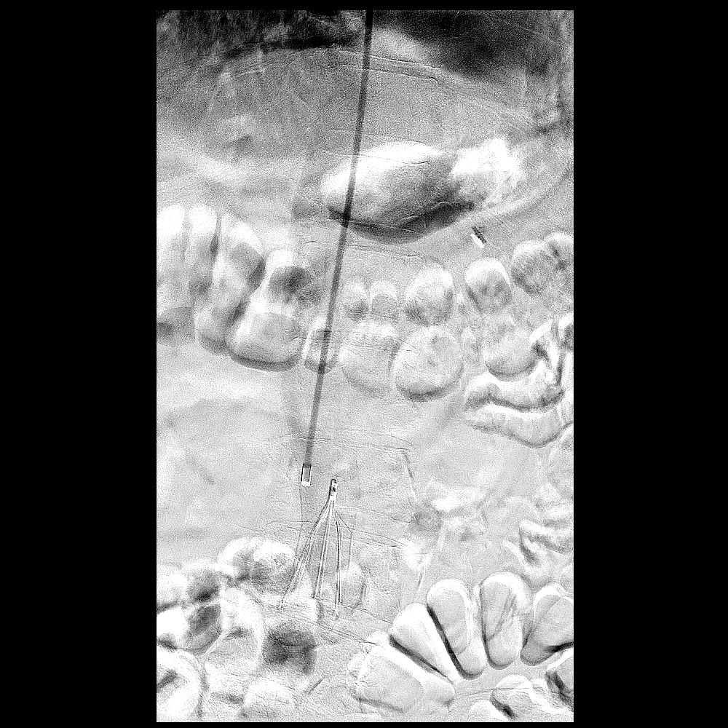

[Series 1: ir (id) (id)/(id)/(id) ir · 2 of 2 slices shown]
[im 1/2]
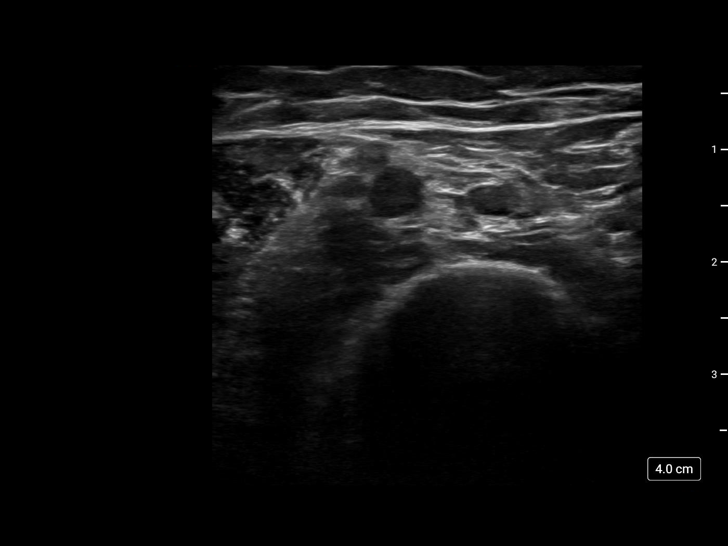
[im 2/2]
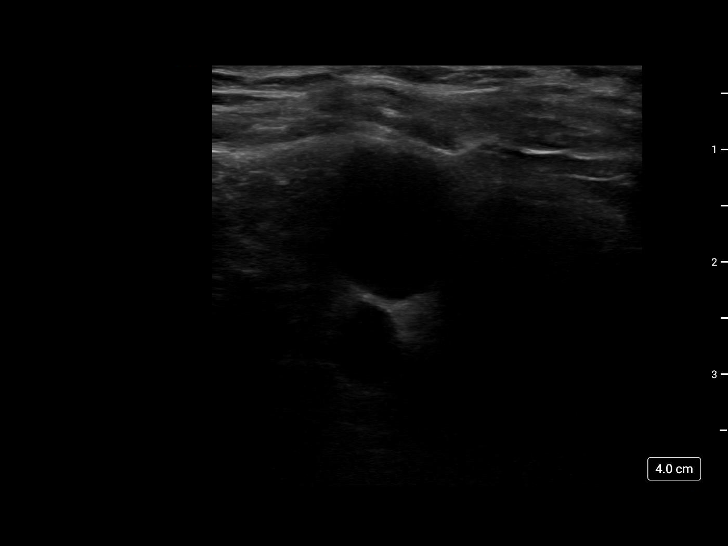

[10 of 10 positions shown; findings below may reference images not displayed]

EXAM:
ULTRASOUND GUIDANCE FOR VASCULARACCESS

IVC CATHETERIZATION AND VENOGRAM

IVC FILTER INSERTION

MEDICATIONS:
None.

ANESTHESIA/SEDATION:
Fentanyl 100 mcg IV; Versed 2 mg IV

Moderate Sedation Time:  23 minutes

The patient was continuously monitored during the procedure by the
interventional radiology nurse under my direct supervision.

FLUOROSCOPY TIME:  Fluoroscopy Time: 1 minutes 48 seconds (228 mGy).

COMPLICATIONS:
None immediate.

PROCEDURE:
Informed written consent was obtained from the patient after a
thorough discussion of the procedural risks, benefits and
alternatives. All questions were addressed. Maximal Sterile Barrier
Technique was utilized including caps, mask, sterile gowns, sterile
gloves, sterile drape, hand hygiene and skin antiseptic. A timeout
was performed prior to the initiation of the procedure.

The right brachial vein was interrogated with ultrasound and found
to be widely patent. An image was obtained and stored for the
medical record. Local anesthesia was attained by infiltration with
1% lidocaine. A small dermatotomy was made. Under real-time
sonographic guidance, the vessel was punctured with a 21 gauge
micropuncture needle. Using standard technique, the initial micro
needle was exchanged over a 0.018 micro wire for a transitional 4
French micro sheath. The micro sheath was then converted into an IV
by adding a valved capped. The IV flushes and aspirates easily. The
IV was secured to the skin in standard fashion.

Maximal barrier sterile technique utilized including caps, mask,
sterile gowns, sterile gloves, large sterile drape, hand hygiene,
and Betadine prep.

Under sterile condition and local anesthesia, right internal jugular
venous access was performed with ultrasound. An ultrasound image was
saved and sent to PACS. Over a guidewire, the IVC filter delivery
sheath and inner dilator were advanced into the IVC just above the
IVC bifurcation. Contrast injection was performed for an IVC
venogram.

Through the delivery sheath, a retrievable Denali IVC filter was
deployed below the level of the renal veins and above the IVC
bifurcation. Limited post deployment venacavagram was performed.

The delivery sheath was removed and hemostasis was obtained with
manual compression. A dressing was placed. The patient tolerated the
procedure well without immediate post procedural complication.
FINDINGS: The IVC is patent. No evidence of thrombus, stenosis, or occlusion.
No variant venous anatomy. Successful placement of the IVC filter
below the level of the renal veins.
IMPRESSION: Successful ultrasound and fluoroscopically guided placement of an
infrarenal retrievable IVC filter via right jugular approach.

PLAN:
Due to patient related comorbidities and/or clinical necessity, this
IVC filter should be considered a permanent device. This patient
will not be actively followed for future filter retrieval.

## 2023-10-07 ENCOUNTER — Other Ambulatory Visit (HOSPITAL_BASED_OUTPATIENT_CLINIC_OR_DEPARTMENT_OTHER): Payer: Self-pay

## 2023-10-08 ENCOUNTER — Other Ambulatory Visit (HOSPITAL_BASED_OUTPATIENT_CLINIC_OR_DEPARTMENT_OTHER): Payer: Self-pay

## 2023-10-08 MED ORDER — PRAVASTATIN SODIUM 20 MG PO TABS
20.0000 mg | ORAL_TABLET | ORAL | 0 refills | Status: DC
  Filled 2023-10-08: qty 24, 84d supply, fill #0
  Filled 2023-12-30: qty 16, 56d supply, fill #1

## 2023-10-08 MED ORDER — LANSOPRAZOLE 30 MG PO CPDR
30.0000 mg | DELAYED_RELEASE_CAPSULE | Freq: Every day | ORAL | 1 refills | Status: AC
  Filled 2023-10-08: qty 90, 90d supply, fill #0
  Filled 2024-01-06: qty 90, 90d supply, fill #1

## 2023-10-09 ENCOUNTER — Encounter (INDEPENDENT_AMBULATORY_CARE_PROVIDER_SITE_OTHER): Payer: Self-pay | Admitting: Otolaryngology

## 2023-10-09 ENCOUNTER — Ambulatory Visit (INDEPENDENT_AMBULATORY_CARE_PROVIDER_SITE_OTHER): Payer: HMO | Admitting: Otolaryngology

## 2023-10-09 VITALS — BP 133/81 | HR 58

## 2023-10-09 DIAGNOSIS — J343 Hypertrophy of nasal turbinates: Secondary | ICD-10-CM

## 2023-10-09 DIAGNOSIS — Z85818 Personal history of malignant neoplasm of other sites of lip, oral cavity, and pharynx: Secondary | ICD-10-CM | POA: Diagnosis not present

## 2023-10-09 DIAGNOSIS — R0981 Nasal congestion: Secondary | ICD-10-CM

## 2023-10-09 DIAGNOSIS — J310 Chronic rhinitis: Secondary | ICD-10-CM

## 2023-10-09 DIAGNOSIS — H6121 Impacted cerumen, right ear: Secondary | ICD-10-CM | POA: Diagnosis not present

## 2023-10-09 NOTE — Progress Notes (Unsigned)
 Patient ID: Derek JONETTA Louder Sr., male   DOB: 1945/05/03, 78 y.o.   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 He underwent removal of a large lower lip lesion in 12/2019. Pathology was consistent with squamous cell carcinoma. The patient returns today noting persistent numbness of his lip but otherwise he is doing really well. He is eating and drinking without difficulty.  He was also seen for chronic maxillary sinusitis.  He was treated with Flonase  nasal spray and Augmentin .  At his last visit in August 2024, his maxillary sinusitis had resolved.  The patient returns today complaining of occasional nasal congestion.  He denies any facial pain or fever.   Objective Objective note General: Communicates without difficulty, well nourished, no acute distress. Head: Normocephalic, no evidence injury, no tenderness, facial buttresses intact without stepoff. Face/sinus: No tenderness to palpation and percussion. Facial movement is normal and symmetric. Eyes: PERRL, EOMI. No scleral icterus, conjunctivae clear. Neuro: CN II exam reveals vision grossly intact. No nystagmus at any point of gaze. Ears: Auricles well formed without lesions. Ear canals are intact without mass or lesion. No erythema or edema is appreciated. The TMs are intact without fluid. Nose: External evaluation reveals normal support and skin without lesions. Dorsum is intact. Anterior rhinoscopy reveals congested mucosa over anterior aspect of inferior turbinates and intact septum. No purulence noted. Oral:  Oral cavity and oropharynx are intact, symmetric, without erythema or edema. Lip incision is well healed. No recurrent lesion is noted. Neck: Full range of motion without pain. There is no significant lymphadenopathy. No masses palpable. Thyroid  bed within normal limits to palpation. Parotid glands and  submandibular glands equal bilaterally without mass. Trachea is midline. Neuro:  CN 2-12 grossly intact.    Procedure:  Flexible Nasal Endoscopy: Description: Risks, benefits, and alternatives of flexible endoscopy were explained to the patient.  Specific mention was made of the risk of throat numbness with difficulty swallowing, possible bleeding from the nose and mouth, and pain from the procedure.  The patient gave oral consent to proceed.  The flexible scope was inserted into the right nasal cavity.  Endoscopy of the interior nasal cavity, superior, inferior, and middle meatus was performed. The sphenoid-ethmoid recess was examined. Edematous mucosa was noted.  No polyp, mass, or lesion was appreciated. Olfactory cleft was clear.  Nasopharynx was clear.  Turbinates were hypertrophied but without mass.  The procedure was repeated on the contralateral side with similar findings.  The patient tolerated the procedure well.    Observations Functional status No functional status recorded  Cognitive status No cognitive status recorded  Assessment Assessment note 1.  History of lower lip SCCA (1.4cm), with clear margins. Staging was T1N0M0.   2.  There is no evidence of recurrent disease.  3.  Chronic rhinitis with nasal mucosal congestion and bilateral inferior turbinate hypertrophy.  No recurrent sinusitis is noted today.   Screenings/Interventions/Assessments No screenings/interventions/assessments recorded  Diagnoses attached to encounter No diagnoses attached  Plan Plan note 1.  The physical exam and nasal endoscopy findings are reviewed with the patient.  2.  Continue with Flonase  nasal spray daily.  3.  No other intervention is needed at this time.  4.  The patient will return for reevaluation in 1 year.

## 2023-10-10 DIAGNOSIS — J343 Hypertrophy of nasal turbinates: Secondary | ICD-10-CM | POA: Insufficient documentation

## 2023-10-10 DIAGNOSIS — Z85818 Personal history of malignant neoplasm of other sites of lip, oral cavity, and pharynx: Secondary | ICD-10-CM | POA: Insufficient documentation

## 2023-10-10 DIAGNOSIS — J310 Chronic rhinitis: Secondary | ICD-10-CM | POA: Insufficient documentation

## 2023-10-10 DIAGNOSIS — H6121 Impacted cerumen, right ear: Secondary | ICD-10-CM | POA: Insufficient documentation

## 2023-10-11 ENCOUNTER — Other Ambulatory Visit: Payer: Self-pay

## 2023-10-11 ENCOUNTER — Encounter: Payer: Self-pay | Admitting: Hematology & Oncology

## 2023-10-11 ENCOUNTER — Inpatient Hospital Stay (HOSPITAL_BASED_OUTPATIENT_CLINIC_OR_DEPARTMENT_OTHER): Admitting: Hematology & Oncology

## 2023-10-11 ENCOUNTER — Inpatient Hospital Stay: Attending: Hematology & Oncology

## 2023-10-11 VITALS — BP 117/81 | HR 64 | Temp 97.8°F | Resp 18 | Ht 71.0 in | Wt 228.0 lb

## 2023-10-11 DIAGNOSIS — D3A8 Other benign neuroendocrine tumors: Secondary | ICD-10-CM

## 2023-10-11 DIAGNOSIS — R609 Edema, unspecified: Secondary | ICD-10-CM | POA: Diagnosis not present

## 2023-10-11 DIAGNOSIS — M79661 Pain in right lower leg: Secondary | ICD-10-CM | POA: Insufficient documentation

## 2023-10-11 DIAGNOSIS — D6851 Activated protein C resistance: Secondary | ICD-10-CM | POA: Insufficient documentation

## 2023-10-11 DIAGNOSIS — E119 Type 2 diabetes mellitus without complications: Secondary | ICD-10-CM

## 2023-10-11 DIAGNOSIS — I82411 Acute embolism and thrombosis of right femoral vein: Secondary | ICD-10-CM | POA: Diagnosis not present

## 2023-10-11 LAB — CMP (CANCER CENTER ONLY)
ALT: 16 U/L (ref 0–44)
AST: 17 U/L (ref 15–41)
Albumin: 4.3 g/dL (ref 3.5–5.0)
Alkaline Phosphatase: 101 U/L (ref 38–126)
Anion gap: 11 (ref 5–15)
BUN: 18 mg/dL (ref 8–23)
CO2: 21 mmol/L — ABNORMAL LOW (ref 22–32)
Calcium: 10.6 mg/dL — ABNORMAL HIGH (ref 8.9–10.3)
Chloride: 105 mmol/L (ref 98–111)
Creatinine: 0.99 mg/dL (ref 0.61–1.24)
GFR, Estimated: >60 mL/min (ref >60)
Glucose, Bld: 229 mg/dL — ABNORMAL HIGH (ref 70–99)
Potassium: 4.9 mmol/L (ref 3.5–5.1)
Sodium: 137 mmol/L (ref 135–145)
Total Bilirubin: 0.4 mg/dL (ref 0.0–1.2)
Total Protein: 7.3 g/dL (ref 6.5–8.1)

## 2023-10-11 LAB — CBC WITH DIFFERENTIAL (CANCER CENTER ONLY)
Abs Immature Granulocytes: 0.02 K/uL (ref 0.00–0.07)
Basophils Absolute: 0.1 K/uL (ref 0.0–0.1)
Basophils Relative: 2 %
Eosinophils Absolute: 0.5 K/uL (ref 0.0–0.5)
Eosinophils Relative: 6 %
HCT: 44.6 % (ref 39.0–52.0)
Hemoglobin: 15 g/dL (ref 13.0–17.0)
Immature Granulocytes: 0 %
Lymphocytes Relative: 29 %
Lymphs Abs: 2.3 K/uL (ref 0.7–4.0)
MCH: 29.5 pg (ref 26.0–34.0)
MCHC: 33.6 g/dL (ref 30.0–36.0)
MCV: 87.8 fL (ref 80.0–100.0)
Monocytes Absolute: 1.1 K/uL — ABNORMAL HIGH (ref 0.1–1.0)
Monocytes Relative: 13 %
Neutro Abs: 4 K/uL (ref 1.7–7.7)
Neutrophils Relative %: 50 %
Platelet Count: 380 K/uL (ref 150–400)
RBC: 5.08 MIL/uL (ref 4.22–5.81)
RDW: 14.2 % (ref 11.5–15.5)
WBC Count: 7.9 K/uL (ref 4.0–10.5)
nRBC: 0 % (ref 0.0–0.2)

## 2023-10-11 LAB — D-DIMER, QUANTITATIVE: D-Dimer, Quant: 0.54 ug{FEU}/mL — ABNORMAL HIGH (ref 0.00–0.50)

## 2023-10-11 NOTE — Progress Notes (Signed)
 : Hematology and Oncology Follow Up Visit  Derek [name] [id number] 08-10-1945 78 y.o. 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 Pradaxa  150 mg p.o. twice daily-start on 12/05/2022 --patient is now off Pradaxa      Interim History:  Derek [name] is back for follow-up.  Overall, he is managing pretty well.  He has lost about 40 pounds on Mounjaro .  I am happy for this.  His main problem now has been the right lower leg.  He has a lot of pain on the right lower leg.  This to the inner aspect of the right lower leg.  Unfortunately, I am not sure there is much that we can do about this.  He did have a CT scan of the right lower leg back in January which shows some degenerative changes in the knee.  There is little bit of edema.  Again, I am not sure what might be going.  He does not think this is a blood clot.  He says he knows how blood clots feel.  This case may be a postphlebitic type of issue.  He has had no problems with cough or shortness of breath.  He has had no issues with nausea or vomiting.  There is been no change in bowel or bladder habits.  He has had no fever.  His appetite has been okay.  Overall, I would say that his performance status is probably ECOG 2.   Medications:  Current Outpatient Medications:    acetaminophen  (TYLENOL ) 500 MG tablet, Take 500 mg by mouth every 6 (six) hours as needed for mild pain or headache., Disp: , Rfl:    carvedilol  (COREG ) 25 MG tablet, Take 1 tablet (25 mg total) by mouth every evening., Disp: 90 tablet, Rfl: 1   clindamycin  (CLEOCIN ) 150 MG capsule, Take 1 capsule (150 mg total) by mouth 3 (three) times daily until finished., Disp: 9 capsule, Rfl: 0   Continuous  Glucose Sensor (FREESTYLE LIBRE 3 SENSOR) MISC, Use as directed and change every 14 (fourteen) days., Disp: 6 each, Rfl: 3   diclofenac Sodium (VOLTAREN) 1 % GEL, Apply topically., Disp: , Rfl:    diphenhydramine -acetaminophen  (TYLENOL  PM) 25-500 MG TABS tablet, Take 1 tablet by mouth at bedtime as needed., Disp: , Rfl:    docusate sodium  (COLACE) 100 MG capsule, Take 100 mg by mouth 2 (two) times daily as needed., Disp: , Rfl:    empagliflozin  (JARDIANCE ) 25 MG TABS tablet, Take 1 tablet (25 mg total) by mouth daily., Disp: 90 tablet, Rfl: 1   fluticasone  (FLONASE ) 50 MCG/ACT nasal spray, Place 2 sprays into both nostrils daily., Disp: 16 g, Rfl: 5   furosemide  (LASIX ) 20 MG tablet, Take 20 mg by mouth daily as needed for edema., Disp: , Rfl:    Insulin  Degludec FlexTouch 100 UNIT/ML SOPN, Inject 40 Units into the skin daily. (Patient taking differently: Inject 40 Units into the skin daily. On sliding scale.), Disp: 45 mL, Rfl: 1   Insulin  Pen Needle (PEN NEEDLES) 31G X 5 MM MISC, Use 1 as directed once daily., Disp: 100 each, Rfl: 3   Insulin  Pen Needle 31G X 5 MM MISC, Use as directed 1 time daily., Disp: 100  each, Rfl: 3   lansoprazole  (PREVACID ) 30 MG capsule, Take 1 capsule (30 mg total) by mouth daily before a meal., Disp: 90 capsule, Rfl: 1   levocetirizine (XYZAL ) 5 MG tablet, Take 5 mg by mouth every evening., Disp: , Rfl:    losartan  (COZAAR ) 50 MG tablet, Take 1 tablet (50 mg total) by mouth daily., Disp: 90 tablet, Rfl: 1   methocarbamol  (ROBAXIN ) 500 MG tablet, Take 1 tablet (500 mg total) by mouth 4 (four) times a day for 10 days., Disp: 40 tablet, Rfl: 0   mupirocin  ointment (BACTROBAN ) 2 %, Apply 1 Application topically 2 (two) times daily for 5 days., Disp: 22 g, Rfl: 3   nystatin  (MYCOSTATIN ) 100000 UNIT/ML suspension, Use 4-5 mL for Mouth/Throat as directed four times a day, Disp: 240 mL, Rfl: 3   Omega-3 Fatty Acids (FISH OIL) 1000 MG CAPS, Take 1 capsule by mouth daily at 6  (six) AM., Disp: , Rfl:    ondansetron  (ZOFRAN -ODT) 4 MG disintegrating tablet, Take 1 tablet (4 mg total) by mouth every 8 (eight) hours as needed for nausea or vomiting., Disp: 30 tablet, Rfl: 0   pravastatin  (PRAVACHOL ) 20 MG tablet, Take 1 tablet (20 mg total) by mouth 2 (two) times a week. *Must make appointment for lab work*, Disp: 40 tablet, Rfl: 0   ramelteon  (ROZEREM ) 8 MG tablet, Take 1 tablet (8 mg total) by mouth at bedtime as needed., Disp: 30 tablet, Rfl: 0   tirzepatide  (MOUNJARO ) 7.5 MG/0.5ML Pen, Inject 7.5 mg into the skin once a week., Disp: 6 mL, Rfl: 1   traMADol  (ULTRAM ) 50 MG tablet, Take 1 tablet (50 mg total) by mouth 2 (two) times daily as needed., Disp: 60 tablet, Rfl: 5   triamcinolone  cream (KENALOG ) 0.1 %, Apply 1 application topically to affected area(s) 2 (two) times daily., Disp: 80 g, Rfl: 3  Allergies:  Allergies  Allergen Reactions   Ditropan  [Oxybutynin ] Swelling and Other (See Comments)   Quinine Other (See Comments)    Platelets dropped- consumptive coagulopathy     Vibra -Tab [Doxycycline ] Other (See Comments)    Epistaxis     Voltaren [Diclofenac] Swelling and Other (See Comments)    Elevated liver enzymes Lip swelling    Zoloft  [Sertraline ] Other (See Comments)    Insomnia Acute urinary retention   Cymbalta  [Duloxetine  Hcl] Other (See Comments)    Night sweats   Fentanyl  Nausea And Vomiting and Other (See Comments)    headaches - Per patient okay to use nothing ever happened with it   Morphine And Codeine  Nausea And Vomiting, Swelling and Other (See Comments)    Facial and lip swelling OK to use IR form of hydrocodone  and oxycodone    Oxycontin  [Oxycodone  Hcl] Swelling and Other (See Comments)    Lip swelling OK to use IR form   Tape Other (See Comments)    Skin tears easily  SKIN IS FRAGILE- CERTAIN BANDAGES TEAR OFF THE SKIN, BANDAIDS ARE OKAY.   Voltaren [Diclofenac Sodium] Other (See Comments)    Elevated liver enzymes    Zohydro Er [Hydrocodone  Bitartrate Er] Swelling    Facial swelling OK to use IR form   Neurontin  [Gabapentin ] Other (See Comments)    Unknown reaction     Past Medical History, Surgical history, Social history, and Family History were reviewed and updated.  Review of Systems: Review of Systems  Constitutional:  Positive for fatigue.  HENT:  Negative.    Eyes: Negative.   Respiratory: Negative.  Cardiovascular:  Positive for leg swelling.  Gastrointestinal: Negative.   Endocrine: Negative.   Genitourinary:  Positive for hematuria.   Musculoskeletal:  Positive for back pain, myalgias and neck pain.  Neurological:  Positive for dizziness and headaches.  Hematological: Negative.   Psychiatric/Behavioral: Negative.      Physical Exam:  Vital signs are temperature of 97.8.  Pulse 64.  Blood pressure 117/81.  Weight is 228 pounds     Wt Readings from Last 3 Encounters:  10/11/23 228 lb (103.4 kg)  08/16/23 235 lb (106.6 kg)  07/06/23 241 lb 1.9 oz (109.4 kg)    Physical Exam Vitals reviewed.  HENT:     Head: Normocephalic and atraumatic.  Eyes:     Pupils: Pupils are equal, round, and reactive to light.  Cardiovascular:     Rate and Rhythm: Normal rate and regular rhythm.     Heart sounds: Normal heart sounds.  Pulmonary:     Effort: Pulmonary effort is normal.     Breath sounds: Normal breath sounds.  Abdominal:     General: Bowel sounds are normal.     Palpations: Abdomen is soft.     Comments: His abdomen is obese.  He has a laparotomy wound.  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  He may have little bit of fullness and firmness.  I am not impressed with the actual abdominal mass.  His liver is not palpable.  Musculoskeletal:        General: No tenderness or deformity. Normal range of motion.     Cervical back: Normal range of motion.     Comments: His extremities does show some mild swelling in the right leg.  It is not as firm.  He has good  warmth to the leg.  He has palpable pulses in the distal extremity.    Lymphadenopathy:     Cervical: No cervical adenopathy.  Skin:    General: Skin is warm and dry.     Findings: No erythema or rash.  Neurological:     Mental Status: He is alert and oriented to person, place, and time.  Psychiatric:        Behavior: Behavior normal.        Thought Content: Thought content normal.        Judgment: Judgment normal.      Lab Results  Component Value Date   WBC 7.9 10/11/2023   HGB 15.0 10/11/2023   HCT 44.6 10/11/2023   MCV 87.8 10/11/2023   PLT 380 10/11/2023     Chemistry      Component Value Date/Time   NA 137 10/11/2023 1332   K 4.9 10/11/2023 1332   CL 105 10/11/2023 1332   CO2 21 (L) 10/11/2023 1332   BUN 18 10/11/2023 1332   CREATININE 0.99 10/11/2023 1332      Component Value Date/Time   CALCIUM  10.6 (H) 10/11/2023 1332   ALKPHOS 101 10/11/2023 1332   AST 17 10/11/2023 1332   ALT 16 10/11/2023 1332   BILITOT 0.4 10/11/2023 1332       Impression and Plan: Derek [name] is a very nice 78 year old white male.  He has had incredible history.  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  He apparently was hospitalized for 4 months because of complications.  However, this is not going to be a problem for him in my opinion.  At this point  in time, Mr. and Mrs. Norman would like to hold off and come back to see us .  Since he is off blood thinner, and has had no issues with thromboembolism, they do not feel that we really are doing much for him.  I really cannot argue about this.  I told him that if he has any problems in the future just let us  know and we will get him in.   Maude JONELLE Crease, MD 8/21/20252:29 PM

## 2023-10-12 ENCOUNTER — Other Ambulatory Visit: Payer: Self-pay

## 2023-10-12 ENCOUNTER — Other Ambulatory Visit (HOSPITAL_BASED_OUTPATIENT_CLINIC_OR_DEPARTMENT_OTHER): Payer: Self-pay

## 2023-10-12 DIAGNOSIS — Z6832 Body mass index (BMI) 32.0-32.9, adult: Secondary | ICD-10-CM | POA: Diagnosis not present

## 2023-10-12 DIAGNOSIS — R609 Edema, unspecified: Secondary | ICD-10-CM | POA: Diagnosis not present

## 2023-10-12 DIAGNOSIS — M1990 Unspecified osteoarthritis, unspecified site: Secondary | ICD-10-CM | POA: Diagnosis not present

## 2023-10-12 MED ORDER — METHOTREXATE SODIUM 2.5 MG PO TABS
7.5000 mg | ORAL_TABLET | ORAL | 3 refills | Status: DC
  Filled 2023-10-12: qty 12, 28d supply, fill #0
  Filled 2023-11-05: qty 12, 28d supply, fill #1
  Filled 2023-12-02: qty 12, 28d supply, fill #2
  Filled 2023-12-30: qty 12, 28d supply, fill #3

## 2023-10-12 MED ORDER — FOLIC ACID 800 MCG PO TABS
800.0000 ug | ORAL_TABLET | Freq: Every day | ORAL | 3 refills | Status: DC
  Filled 2023-10-12: qty 30, 30d supply, fill #0
  Filled 2023-11-14: qty 30, 30d supply, fill #1
  Filled 2023-12-10: qty 30, 30d supply, fill #2
  Filled 2024-01-06: qty 30, 30d supply, fill #3

## 2023-11-06 ENCOUNTER — Other Ambulatory Visit (HOSPITAL_BASED_OUTPATIENT_CLINIC_OR_DEPARTMENT_OTHER): Payer: Self-pay

## 2023-11-14 ENCOUNTER — Other Ambulatory Visit (HOSPITAL_BASED_OUTPATIENT_CLINIC_OR_DEPARTMENT_OTHER): Payer: Self-pay

## 2023-11-29 ENCOUNTER — Other Ambulatory Visit (HOSPITAL_BASED_OUTPATIENT_CLINIC_OR_DEPARTMENT_OTHER): Payer: Self-pay

## 2023-11-29 ENCOUNTER — Other Ambulatory Visit: Payer: Self-pay

## 2023-11-29 MED ORDER — TIRZEPATIDE 7.5 MG/0.5ML ~~LOC~~ SOAJ
7.5000 mg | SUBCUTANEOUS | 1 refills | Status: AC
  Filled 2023-11-29: qty 6, 84d supply, fill #0
  Filled 2024-02-10 – 2024-02-25 (×4): qty 6, 84d supply, fill #1

## 2023-11-30 ENCOUNTER — Other Ambulatory Visit (HOSPITAL_BASED_OUTPATIENT_CLINIC_OR_DEPARTMENT_OTHER): Payer: Self-pay

## 2023-11-30 MED ORDER — FLUZONE HIGH-DOSE 0.5 ML IM SUSY
0.5000 mL | PREFILLED_SYRINGE | Freq: Once | INTRAMUSCULAR | 0 refills | Status: AC
Start: 2023-11-30 — End: 2023-12-01
  Filled 2023-11-30: qty 0.5, 1d supply, fill #0

## 2023-12-03 ENCOUNTER — Other Ambulatory Visit: Payer: Self-pay

## 2023-12-04 NOTE — Telephone Encounter (Signed)
 Ms. Panuco called in and wanted to know if orders can please be placed for Derek [name] to have a steroid injection for his back pain. She said that the last bursa injection back in March, 2024 did not help very much.   We also scheduled first available office visit with Dr. Tobie to discuss new pain in right leg.   Please review and place orders if appropriate, thank you!

## 2023-12-05 ENCOUNTER — Other Ambulatory Visit (HOSPITAL_BASED_OUTPATIENT_CLINIC_OR_DEPARTMENT_OTHER): Payer: Self-pay

## 2023-12-06 ENCOUNTER — Other Ambulatory Visit (HOSPITAL_BASED_OUTPATIENT_CLINIC_OR_DEPARTMENT_OTHER): Payer: Self-pay

## 2023-12-06 MED ORDER — EMPAGLIFLOZIN 25 MG PO TABS
25.0000 mg | ORAL_TABLET | Freq: Every day | ORAL | 1 refills | Status: AC
  Filled 2023-12-06: qty 90, 90d supply, fill #0
  Filled 2024-03-06: qty 90, 90d supply, fill #1

## 2023-12-10 ENCOUNTER — Other Ambulatory Visit (HOSPITAL_BASED_OUTPATIENT_CLINIC_OR_DEPARTMENT_OTHER): Payer: Self-pay

## 2023-12-11 ENCOUNTER — Other Ambulatory Visit (HOSPITAL_BASED_OUTPATIENT_CLINIC_OR_DEPARTMENT_OTHER): Payer: Self-pay

## 2023-12-11 DIAGNOSIS — M7989 Other specified soft tissue disorders: Secondary | ICD-10-CM | POA: Diagnosis not present

## 2023-12-11 DIAGNOSIS — I83009 Varicose veins of unspecified lower extremity with ulcer of unspecified site: Secondary | ICD-10-CM | POA: Diagnosis not present

## 2023-12-11 DIAGNOSIS — R238 Other skin changes: Secondary | ICD-10-CM | POA: Diagnosis not present

## 2023-12-11 MED ORDER — AMOXICILLIN-POT CLAVULANATE 500-125 MG PO TABS
1.0000 | ORAL_TABLET | Freq: Two times a day (BID) | ORAL | 0 refills | Status: AC
  Filled 2023-12-11: qty 14, 7d supply, fill #0
  Filled 2023-12-11: qty 6, 3d supply, fill #0

## 2023-12-12 ENCOUNTER — Other Ambulatory Visit (HOSPITAL_BASED_OUTPATIENT_CLINIC_OR_DEPARTMENT_OTHER): Payer: Self-pay

## 2023-12-26 ENCOUNTER — Encounter (HOSPITAL_BASED_OUTPATIENT_CLINIC_OR_DEPARTMENT_OTHER): Attending: General Surgery | Admitting: General Surgery

## 2023-12-26 ENCOUNTER — Other Ambulatory Visit (HOSPITAL_BASED_OUTPATIENT_CLINIC_OR_DEPARTMENT_OTHER): Payer: Self-pay

## 2023-12-26 DIAGNOSIS — L97812 Non-pressure chronic ulcer of other part of right lower leg with fat layer exposed: Secondary | ICD-10-CM | POA: Insufficient documentation

## 2023-12-26 DIAGNOSIS — I87033 Postthrombotic syndrome with ulcer and inflammation of bilateral lower extremity: Secondary | ICD-10-CM | POA: Diagnosis not present

## 2023-12-26 DIAGNOSIS — D6859 Other primary thrombophilia: Secondary | ICD-10-CM | POA: Diagnosis not present

## 2023-12-26 DIAGNOSIS — I872 Venous insufficiency (chronic) (peripheral): Secondary | ICD-10-CM | POA: Insufficient documentation

## 2023-12-26 DIAGNOSIS — E11622 Type 2 diabetes mellitus with other skin ulcer: Secondary | ICD-10-CM | POA: Insufficient documentation

## 2023-12-26 DIAGNOSIS — L97312 Non-pressure chronic ulcer of right ankle with fat layer exposed: Secondary | ICD-10-CM | POA: Diagnosis not present

## 2023-12-26 MED ORDER — SANTYL 250 UNIT/GM EX OINT
1.0000 | TOPICAL_OINTMENT | Freq: Every day | CUTANEOUS | 1 refills | Status: AC
  Filled 2023-12-26: qty 30, 30d supply, fill #0
  Filled 2024-01-23 (×2): qty 30, 30d supply, fill #1

## 2023-12-27 ENCOUNTER — Other Ambulatory Visit (HOSPITAL_BASED_OUTPATIENT_CLINIC_OR_DEPARTMENT_OTHER): Payer: Self-pay

## 2023-12-27 DIAGNOSIS — L97819 Non-pressure chronic ulcer of other part of right lower leg with unspecified severity: Secondary | ICD-10-CM | POA: Diagnosis not present

## 2023-12-30 ENCOUNTER — Other Ambulatory Visit (HOSPITAL_BASED_OUTPATIENT_CLINIC_OR_DEPARTMENT_OTHER): Payer: Self-pay

## 2023-12-31 ENCOUNTER — Other Ambulatory Visit (HOSPITAL_BASED_OUTPATIENT_CLINIC_OR_DEPARTMENT_OTHER): Payer: Self-pay

## 2023-12-31 ENCOUNTER — Other Ambulatory Visit: Payer: Self-pay

## 2023-12-31 MED ORDER — FREESTYLE LIBRE 3 SENSOR MISC
1.0000 | 3 refills | Status: AC
  Filled 2023-12-31: qty 6, 84d supply, fill #0
  Filled 2024-03-19: qty 6, 84d supply, fill #1

## 2024-01-01 ENCOUNTER — Encounter (HOSPITAL_BASED_OUTPATIENT_CLINIC_OR_DEPARTMENT_OTHER): Admitting: Internal Medicine

## 2024-01-02 ENCOUNTER — Other Ambulatory Visit: Payer: Self-pay

## 2024-01-03 ENCOUNTER — Encounter (HOSPITAL_BASED_OUTPATIENT_CLINIC_OR_DEPARTMENT_OTHER): Admitting: General Surgery

## 2024-01-03 DIAGNOSIS — I872 Venous insufficiency (chronic) (peripheral): Secondary | ICD-10-CM | POA: Diagnosis not present

## 2024-01-03 DIAGNOSIS — L97312 Non-pressure chronic ulcer of right ankle with fat layer exposed: Secondary | ICD-10-CM | POA: Diagnosis not present

## 2024-01-07 ENCOUNTER — Other Ambulatory Visit (HOSPITAL_BASED_OUTPATIENT_CLINIC_OR_DEPARTMENT_OTHER): Payer: Self-pay

## 2024-01-15 ENCOUNTER — Encounter (HOSPITAL_BASED_OUTPATIENT_CLINIC_OR_DEPARTMENT_OTHER): Admitting: General Surgery

## 2024-01-15 DIAGNOSIS — L97312 Non-pressure chronic ulcer of right ankle with fat layer exposed: Secondary | ICD-10-CM | POA: Diagnosis not present

## 2024-01-15 DIAGNOSIS — I872 Venous insufficiency (chronic) (peripheral): Secondary | ICD-10-CM | POA: Diagnosis not present

## 2024-01-22 ENCOUNTER — Other Ambulatory Visit (HOSPITAL_BASED_OUTPATIENT_CLINIC_OR_DEPARTMENT_OTHER): Payer: Self-pay

## 2024-01-22 ENCOUNTER — Other Ambulatory Visit: Payer: Self-pay

## 2024-01-22 DIAGNOSIS — M1990 Unspecified osteoarthritis, unspecified site: Secondary | ICD-10-CM | POA: Diagnosis not present

## 2024-01-22 DIAGNOSIS — L989 Disorder of the skin and subcutaneous tissue, unspecified: Secondary | ICD-10-CM | POA: Diagnosis not present

## 2024-01-22 DIAGNOSIS — Z23 Encounter for immunization: Secondary | ICD-10-CM | POA: Diagnosis not present

## 2024-01-22 DIAGNOSIS — I809 Phlebitis and thrombophlebitis of unspecified site: Secondary | ICD-10-CM | POA: Diagnosis not present

## 2024-01-22 DIAGNOSIS — R109 Unspecified abdominal pain: Secondary | ICD-10-CM | POA: Diagnosis not present

## 2024-01-22 DIAGNOSIS — Z6831 Body mass index (BMI) 31.0-31.9, adult: Secondary | ICD-10-CM | POA: Diagnosis not present

## 2024-01-22 MED ORDER — TRAMADOL HCL 50 MG PO TABS
50.0000 mg | ORAL_TABLET | Freq: Three times a day (TID) | ORAL | 5 refills | Status: AC | PRN
  Filled 2024-01-22 – 2024-02-04 (×3): qty 60, 10d supply, fill #0
  Filled 2024-03-02: qty 60, 10d supply, fill #1
  Filled 2024-03-21: qty 60, 10d supply, fill #2

## 2024-01-22 MED ORDER — MUPIROCIN CALCIUM 2 % EX CREA
TOPICAL_CREAM | CUTANEOUS | 3 refills | Status: AC
  Filled 2024-01-22: qty 15, 30d supply, fill #0
  Filled 2024-02-19: qty 15, 30d supply, fill #1
  Filled 2024-03-19: qty 15, 30d supply, fill #2

## 2024-01-23 ENCOUNTER — Other Ambulatory Visit (HOSPITAL_BASED_OUTPATIENT_CLINIC_OR_DEPARTMENT_OTHER): Payer: Self-pay

## 2024-01-23 ENCOUNTER — Other Ambulatory Visit: Payer: Self-pay

## 2024-01-24 ENCOUNTER — Other Ambulatory Visit (HOSPITAL_BASED_OUTPATIENT_CLINIC_OR_DEPARTMENT_OTHER): Payer: Self-pay

## 2024-01-27 ENCOUNTER — Other Ambulatory Visit (HOSPITAL_BASED_OUTPATIENT_CLINIC_OR_DEPARTMENT_OTHER): Payer: Self-pay

## 2024-01-28 ENCOUNTER — Other Ambulatory Visit (HOSPITAL_BASED_OUTPATIENT_CLINIC_OR_DEPARTMENT_OTHER): Payer: Self-pay

## 2024-01-30 ENCOUNTER — Other Ambulatory Visit (HOSPITAL_BASED_OUTPATIENT_CLINIC_OR_DEPARTMENT_OTHER): Payer: Self-pay

## 2024-02-04 ENCOUNTER — Other Ambulatory Visit (HOSPITAL_BASED_OUTPATIENT_CLINIC_OR_DEPARTMENT_OTHER): Payer: Self-pay

## 2024-02-05 ENCOUNTER — Telehealth (INDEPENDENT_AMBULATORY_CARE_PROVIDER_SITE_OTHER): Payer: Self-pay | Admitting: Otolaryngology

## 2024-02-05 NOTE — Telephone Encounter (Signed)
 The patient's wife called in.  Dr Karis has been following the patient since 2021 for lip cancer.  He now has a similar sore on his lear lobe that is not healing.  She is hoping for some advice on how to start treatment for this issue?  Please advise .

## 2024-02-05 NOTE — Telephone Encounter (Signed)
 Returned patient 's phone call.  Per Dr. Karis,  he can see any providers in the practice for ear lesion. I left this information on his voice mail.

## 2024-02-11 ENCOUNTER — Ambulatory Visit (INDEPENDENT_AMBULATORY_CARE_PROVIDER_SITE_OTHER)

## 2024-02-11 ENCOUNTER — Encounter (INDEPENDENT_AMBULATORY_CARE_PROVIDER_SITE_OTHER): Payer: Self-pay

## 2024-02-11 ENCOUNTER — Other Ambulatory Visit (HOSPITAL_BASED_OUTPATIENT_CLINIC_OR_DEPARTMENT_OTHER): Payer: Self-pay

## 2024-02-12 ENCOUNTER — Other Ambulatory Visit (HOSPITAL_BASED_OUTPATIENT_CLINIC_OR_DEPARTMENT_OTHER): Payer: Self-pay

## 2024-02-19 ENCOUNTER — Other Ambulatory Visit (HOSPITAL_COMMUNITY): Payer: Self-pay

## 2024-02-19 ENCOUNTER — Other Ambulatory Visit (HOSPITAL_BASED_OUTPATIENT_CLINIC_OR_DEPARTMENT_OTHER): Payer: Self-pay

## 2024-02-19 MED ORDER — PRAVASTATIN SODIUM 20 MG PO TABS
20.0000 mg | ORAL_TABLET | ORAL | 1 refills | Status: AC
  Filled 2024-02-19: qty 24, 84d supply, fill #0

## 2024-02-19 MED ORDER — FOLIC ACID 800 MCG PO TABS
800.0000 ug | ORAL_TABLET | Freq: Every day | ORAL | 1 refills | Status: AC
  Filled 2024-02-19: qty 90, 90d supply, fill #0

## 2024-02-19 MED ORDER — METHOTREXATE SODIUM 2.5 MG PO TABS
7.5000 mg | ORAL_TABLET | ORAL | 3 refills | Status: AC
  Filled 2024-02-19: qty 12, 28d supply, fill #0
  Filled 2024-03-15: qty 12, 28d supply, fill #1

## 2024-02-25 ENCOUNTER — Other Ambulatory Visit (HOSPITAL_BASED_OUTPATIENT_CLINIC_OR_DEPARTMENT_OTHER): Payer: Self-pay

## 2024-02-27 ENCOUNTER — Encounter (HOSPITAL_BASED_OUTPATIENT_CLINIC_OR_DEPARTMENT_OTHER): Payer: Self-pay

## 2024-02-27 ENCOUNTER — Other Ambulatory Visit (HOSPITAL_BASED_OUTPATIENT_CLINIC_OR_DEPARTMENT_OTHER): Payer: Self-pay

## 2024-03-03 ENCOUNTER — Other Ambulatory Visit: Payer: Self-pay

## 2024-03-06 ENCOUNTER — Other Ambulatory Visit (HOSPITAL_BASED_OUTPATIENT_CLINIC_OR_DEPARTMENT_OTHER): Payer: Self-pay

## 2024-03-07 ENCOUNTER — Other Ambulatory Visit: Payer: Self-pay

## 2024-03-10 ENCOUNTER — Other Ambulatory Visit (HOSPITAL_BASED_OUTPATIENT_CLINIC_OR_DEPARTMENT_OTHER): Payer: Self-pay

## 2024-03-11 ENCOUNTER — Other Ambulatory Visit (HOSPITAL_BASED_OUTPATIENT_CLINIC_OR_DEPARTMENT_OTHER): Payer: Self-pay

## 2024-03-11 MED ORDER — CARVEDILOL 25 MG PO TABS
25.0000 mg | ORAL_TABLET | Freq: Every evening | ORAL | 1 refills | Status: AC
  Filled 2024-03-11: qty 90, 90d supply, fill #0

## 2024-03-11 MED ORDER — TRIAMCINOLONE ACETONIDE 0.1 % EX CREA
TOPICAL_CREAM | Freq: Two times a day (BID) | CUTANEOUS | 3 refills | Status: AC
  Filled 2024-03-11: qty 15, 30d supply, fill #0

## 2024-03-11 MED ORDER — LOSARTAN POTASSIUM 50 MG PO TABS
50.0000 mg | ORAL_TABLET | Freq: Every day | ORAL | 1 refills | Status: AC
  Filled 2024-03-11: qty 90, 90d supply, fill #0

## 2024-03-18 ENCOUNTER — Other Ambulatory Visit (HOSPITAL_BASED_OUTPATIENT_CLINIC_OR_DEPARTMENT_OTHER): Payer: Self-pay

## 2024-03-19 ENCOUNTER — Ambulatory Visit (INDEPENDENT_AMBULATORY_CARE_PROVIDER_SITE_OTHER): Admitting: Otolaryngology

## 2024-03-19 ENCOUNTER — Encounter (INDEPENDENT_AMBULATORY_CARE_PROVIDER_SITE_OTHER): Payer: Self-pay | Admitting: Otolaryngology

## 2024-03-19 ENCOUNTER — Other Ambulatory Visit (HOSPITAL_COMMUNITY)
Admission: RE | Admit: 2024-03-19 | Discharge: 2024-03-19 | Disposition: A | Source: Ambulatory Visit | Attending: Otolaryngology | Admitting: Otolaryngology

## 2024-03-19 VITALS — BP 138/86 | HR 80 | Ht 71.0 in | Wt 207.0 lb

## 2024-03-19 DIAGNOSIS — J343 Hypertrophy of nasal turbinates: Secondary | ICD-10-CM

## 2024-03-19 DIAGNOSIS — J310 Chronic rhinitis: Secondary | ICD-10-CM | POA: Diagnosis not present

## 2024-03-19 DIAGNOSIS — C44209 Unspecified malignant neoplasm of skin of left ear and external auricular canal: Secondary | ICD-10-CM

## 2024-03-19 DIAGNOSIS — R0981 Nasal congestion: Secondary | ICD-10-CM

## 2024-03-19 DIAGNOSIS — Z85818 Personal history of malignant neoplasm of other sites of lip, oral cavity, and pharynx: Secondary | ICD-10-CM | POA: Diagnosis not present

## 2024-03-20 NOTE — Progress Notes (Signed)
 Patient ID: Derek JONETTA Louder Sr., male   DOB: [birth year], 79 y.o.   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 is a 79 year old male with a history of significant sun exposure who presents today with a new complaint of a left ear lesion.  The patient was previously seen for his lower lip squamous cell carcinoma, chronic maxillary sinusitis, and chronic nasal congestion.  He reports a new lesion on his left ear present for approximately 2-3 months. He is uncertain if the size of the lesion has changed. He has applied topical antibiotic ointment, such as Neosporin or bacitracin , without use of other medications due to limited access. No other therapies have been attempted.  He has a history of extensive sun exposure from years of fishing and motorcycle riding without sunscreen use in his youth. He also has a history of prior tobacco use.  His lower lip squamous cell carcinoma was surgically excised.  His chronic rhinitis was treated with Flonase  nasal spray and nasal saline irrigation.  Exam: General: Communicates without difficulty, well nourished, no acute distress. Head: Normocephalic, no evidence injury, no tenderness, facial buttresses intact without stepoff. Face/sinus: No tenderness to palpation and percussion. Facial movement is normal and symmetric. Eyes: PERRL, EOMI. No scleral icterus, conjunctivae clear. Neuro: CN II exam reveals vision grossly intact. No nystagmus at any point of gaze. Ears: An ulcerate/nodular lesion is noted on the scaphoid fossa of the left auricle.  The ear canals and tympanic membranes are normal bilaterally.  Nose: External evaluation reveals normal support and skin without lesions. Dorsum is intact. Anterior rhinoscopy reveals congested mucosa over anterior aspect of inferior turbinates and intact septum. No  purulence noted. Oral:  Oral cavity and oropharynx are intact, symmetric, without erythema or edema. Lip incision is well healed. No recurrent lesion is noted. Neck: Full range of motion without pain. There is no significant lymphadenopathy. No masses palpable. Thyroid  bed within normal limits to palpation. Parotid glands and submandibular glands equal bilaterally without mass. Trachea is midline. Neuro:  CN 2-12 grossly intact.  Procedure: Punch biopsy of the left auricular lesion.   Indication: A suspicious left auricular lesion. Anesthesia: local anesthesia with 1% lidocaine  with epinephrine .   Description: The patient is placed supine on the exam table.  After adequate local anesthesia is achieved with direct injection, a 3mm punch biopsy is used to excise a punch specimen from the left auricular lesion.  The specimen is sent to the pathologist for histologic identification.  Hemostasis is achieved with local pressure.  The patient tolerated the procedure well. Assessment & Plan Left ear lesion, biopsy performed Ulcerated left ear lesion of 2-3 months' duration, concerning for malignancy given significant sun exposure and prior tobacco use. If malignant, surgical excision will be required. - Performed punch biopsy of left ear lesion and sent specimen for pathological analysis. - Administered local anesthetic prior to biopsy. - Advised to apply bacitracin  or Neosporin ointment to the biopsy site. - Instructed to hold pressure if bleeding occurs. - Scheduled follow-up in approximately two weeks to review pathology results.  Chronic rhinitis with nasal mucosal congestion and bilateral inferior turbinate hypertrophy. No recurrent sinusitis is noted today.  - Reinforced importance of continued and regular use of fluticasone  nasal spray.  History of lower lip SCCA (1.4cm), with clear margins. Staging was T1N0M0.  - There is  no evidence of recurrent disease.

## 2024-03-21 ENCOUNTER — Other Ambulatory Visit: Payer: Self-pay

## 2024-03-21 ENCOUNTER — Other Ambulatory Visit (HOSPITAL_BASED_OUTPATIENT_CLINIC_OR_DEPARTMENT_OTHER): Payer: Self-pay

## 2024-03-27 LAB — SURGICAL PATHOLOGY

## 2024-03-28 ENCOUNTER — Telehealth (INDEPENDENT_AMBULATORY_CARE_PROVIDER_SITE_OTHER): Payer: Self-pay | Admitting: Otolaryngology

## 2024-03-28 NOTE — Telephone Encounter (Signed)
"   I called and spoke to patient 's wife. Per Dr. Karis, I told her that the ear biopsy was benign.He will follow up as needed.  "

## 2024-04-02 ENCOUNTER — Ambulatory Visit (INDEPENDENT_AMBULATORY_CARE_PROVIDER_SITE_OTHER): Admitting: Otolaryngology
